# Patient Record
Sex: Male | Born: 1935 | Race: White | Hispanic: No | Marital: Single
Health system: Midwestern US, Community
[De-identification: ages and names within clinical notes are randomized; demographics above are authoritative.]

## PROBLEM LIST (undated history)

## (undated) DIAGNOSIS — G629 Polyneuropathy, unspecified: Secondary | ICD-10-CM

## (undated) DIAGNOSIS — K219 Gastro-esophageal reflux disease without esophagitis: Secondary | ICD-10-CM

## (undated) DIAGNOSIS — F329 Major depressive disorder, single episode, unspecified: Secondary | ICD-10-CM

## (undated) DIAGNOSIS — M869 Osteomyelitis, unspecified: Secondary | ICD-10-CM

## (undated) DIAGNOSIS — J449 Chronic obstructive pulmonary disease, unspecified: Secondary | ICD-10-CM

## (undated) DIAGNOSIS — I519 Heart disease, unspecified: Secondary | ICD-10-CM

## (undated) DIAGNOSIS — Z86718 Personal history of other venous thrombosis and embolism: Secondary | ICD-10-CM

## (undated) DIAGNOSIS — M1990 Unspecified osteoarthritis, unspecified site: Secondary | ICD-10-CM

## (undated) DIAGNOSIS — F32A Depression, unspecified: Secondary | ICD-10-CM

## (undated) DIAGNOSIS — Z8489 Family history of other specified conditions: Secondary | ICD-10-CM

## (undated) DIAGNOSIS — I2510 Atherosclerotic heart disease of native coronary artery without angina pectoris: Secondary | ICD-10-CM

## (undated) DIAGNOSIS — I509 Heart failure, unspecified: Secondary | ICD-10-CM

## (undated) DIAGNOSIS — L409 Psoriasis, unspecified: Secondary | ICD-10-CM

## (undated) DIAGNOSIS — D892 Hypergammaglobulinemia, unspecified: Secondary | ICD-10-CM

## (undated) DIAGNOSIS — Z550 Illiteracy and low-level literacy: Secondary | ICD-10-CM

## (undated) DIAGNOSIS — I739 Peripheral vascular disease, unspecified: Secondary | ICD-10-CM

## (undated) HISTORY — DX: Personal history of other venous thrombosis and embolism: Z86.718

## (undated) HISTORY — PX: APPENDECTOMY: SHX54

## (undated) HISTORY — DX: Depression, unspecified: F32.A

## (undated) HISTORY — PX: FOOT SURGERY: SHX648

## (undated) HISTORY — DX: Chronic obstructive pulmonary disease, unspecified: J44.9

## (undated) HISTORY — DX: Heart failure, unspecified: I50.9

## (undated) HISTORY — PX: CARDIAC SURGERY: SHX584

## (undated) HISTORY — DX: Major depressive disorder, single episode, unspecified: F32.9

## (undated) HISTORY — PX: PACEMAKER INSERTION: SHX728

## (undated) HISTORY — DX: Hypergammaglobulinemia, unspecified: D89.2

## (undated) HISTORY — DX: Polyneuropathy, unspecified: G62.9

## (undated) HISTORY — DX: Unspecified osteoarthritis, unspecified site: M19.90

## (undated) HISTORY — DX: Atherosclerotic heart disease of native coronary artery without angina pectoris: I25.10

## (undated) HISTORY — PX: OTHER SURGICAL HISTORY: SHX169

## (undated) HISTORY — DX: Osteomyelitis, unspecified: M86.9

## (undated) HISTORY — DX: Heart disease, unspecified: I51.9

## (undated) HISTORY — DX: Gastro-esophageal reflux disease without esophagitis: K21.9
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
## 2000-02-16 HISTORY — PX: CORONARY ARTERY BYPASS GRAFT: SHX141

## 2010-02-15 HISTORY — PX: CLAVICLE SURGERY: SHX598

## 2010-10-23 ENCOUNTER — Ambulatory Visit: Payer: Self-pay

## 2010-10-26 ENCOUNTER — Ambulatory Visit: Payer: Self-pay

## 2010-10-30 ENCOUNTER — Ambulatory Visit: Payer: Self-pay | Admitting: Internal Medicine

## 2011-01-18 ENCOUNTER — Emergency Department: Payer: Self-pay | Admitting: Internal Medicine

## 2011-01-26 ENCOUNTER — Ambulatory Visit: Payer: Self-pay | Admitting: Orthopedic Surgery

## 2011-01-28 ENCOUNTER — Ambulatory Visit: Payer: Self-pay | Admitting: Orthopedic Surgery

## 2011-03-01 ENCOUNTER — Other Ambulatory Visit: Payer: Self-pay | Admitting: Orthopedic Surgery

## 2011-03-01 DIAGNOSIS — R200 Anesthesia of skin: Secondary | ICD-10-CM

## 2011-03-01 DIAGNOSIS — M542 Cervicalgia: Secondary | ICD-10-CM

## 2011-03-01 DIAGNOSIS — R202 Paresthesia of skin: Secondary | ICD-10-CM

## 2011-03-04 ENCOUNTER — Ambulatory Visit
Admission: RE | Admit: 2011-03-04 | Discharge: 2011-03-04 | Disposition: A | Discharge status: Home / Self Care | Payer: Medicare PPO | Source: Ambulatory Visit | Attending: Orthopedic Surgery | Admitting: Orthopedic Surgery

## 2011-03-04 DIAGNOSIS — R200 Anesthesia of skin: Secondary | ICD-10-CM

## 2011-03-04 DIAGNOSIS — M542 Cervicalgia: Secondary | ICD-10-CM

## 2011-03-04 DIAGNOSIS — R202 Paresthesia of skin: Secondary | ICD-10-CM

## 2011-03-04 MED ORDER — IOHEXOL 300 MG/ML  SOLN
10.0000 mL | Freq: Once | INTRAMUSCULAR | Status: AC | PRN
  Administered 2011-03-04: 10 mL via INTRATHECAL

## 2011-03-04 MED ORDER — DIAZEPAM 5 MG PO TABS
5.0000 mg | ORAL_TABLET | Freq: Once | ORAL | Status: AC
  Administered 2011-03-04: 5 mg via ORAL

## 2011-03-04 NOTE — Progress Notes (Signed)
Explained discharge instructions to pt and daughter, consent signed and valium to be given.

## 2011-03-04 NOTE — Progress Notes (Signed)
Patient eating a snack with daughter at bedside.  Denies pain.  jkl

## 2011-07-23 LAB — METABOLIC PANEL, COMPREHENSIVE
A-G Ratio: 0.8 — ABNORMAL LOW (ref 1.1–2.2)
ALT (SGPT): 18 U/L (ref 12–78)
AST (SGOT): 20 U/L (ref 15–37)
Albumin: 3.9 g/dL (ref 3.5–5.0)
Alk. phosphatase: 145 U/L — ABNORMAL HIGH (ref 50–136)
Anion gap: 6 mmol/L (ref 5–15)
BUN/Creatinine ratio: 11 — ABNORMAL LOW (ref 12–20)
BUN: 10 MG/DL (ref 6–20)
Bilirubin, total: 0.6 MG/DL (ref 0.2–1.0)
CO2: 27 MMOL/L (ref 21–32)
Calcium: 9.2 MG/DL (ref 8.5–10.1)
Chloride: 108 MMOL/L (ref 97–108)
Creatinine: 0.92 MG/DL (ref 0.45–1.15)
GFR est AA: >60 mL/min/{1.73_m2} (ref >60)
GFR est non-AA: >60 mL/min/{1.73_m2} (ref >60)
Globulin: 4.6 g/dL — ABNORMAL HIGH (ref 2.0–4.0)
Glucose: 94 MG/DL (ref 65–100)
Potassium: 3.9 MMOL/L (ref 3.5–5.1)
Protein, total: 8.5 g/dL — ABNORMAL HIGH (ref 6.4–8.2)
Sodium: 141 MMOL/L (ref 136–145)

## 2011-07-23 LAB — EKG, 12 LEAD, INITIAL
Atrial Rate: 66 {beats}/min
Calculated R Axis: -94 degrees
Calculated T Axis: 83 degrees
P-R Interval: 112 ms
Q-T Interval: 454 ms
QRS Duration: 144 ms
QTC Calculation (Bezet): 475 ms
Ventricular Rate: 66 {beats}/min

## 2011-07-23 LAB — CBC WITH AUTOMATED DIFF
ABS. BASOPHILS: 0 10*3/uL (ref 0.0–0.1)
ABS. EOSINOPHILS: 0.3 10*3/uL (ref 0.0–0.4)
ABS. LYMPHOCYTES: 1.4 10*3/uL (ref 0.8–3.5)
ABS. MONOCYTES: 0.6 10*3/uL (ref 0.0–1.0)
ABS. NEUTROPHILS: 4.9 10*3/uL (ref 1.8–8.0)
BASOPHILS: 0 % (ref 0–1)
EOSINOPHILS: 4 % (ref 0–7)
HCT: 38.5 % (ref 36.6–50.3)
HGB: 13.7 g/dL (ref 12.1–17.0)
LYMPHOCYTES: 19 % (ref 12–49)
MCH: 32.7 PG (ref 26.0–34.0)
MCHC: 35.6 g/dL (ref 30.0–36.5)
MCV: 91.9 FL (ref 80.0–99.0)
MONOCYTES: 8 % (ref 5–13)
NEUTROPHILS: 69 % (ref 32–75)
PLATELET: 246 10*3/uL (ref 150–400)
RBC: 4.19 M/uL (ref 4.10–5.70)
RDW: 14.1 % (ref 11.5–14.5)
WBC: 7.1 10*3/uL (ref 4.1–11.1)

## 2011-07-23 LAB — TROPONIN I: Troponin-I, Qt.: <0.04 ng/mL (ref <0.05)

## 2011-07-23 LAB — CK W/ REFLX CKMB: CK: 95 U/L (ref 39–308)

## 2011-07-23 MED ORDER — SODIUM CHLORIDE 0.9 % IJ SYRG
Freq: Three times a day (TID) | INTRAMUSCULAR | Status: DC
Start: 2011-07-23 — End: 2011-07-23
  Administered 2011-07-23: 18:00:00 via INTRAVENOUS

## 2011-07-23 MED ORDER — CEPHALEXIN 500 MG CAP
500 mg | ORAL_CAPSULE | Freq: Four times a day (QID) | ORAL | Status: AC
Start: 2011-07-23 — End: 2011-07-30

## 2011-07-23 MED ORDER — TRIMETHOPRIM-SULFAMETHOXAZOLE 160 MG-800 MG TAB
160-800 mg | ORAL_TABLET | Freq: Two times a day (BID) | ORAL | Status: DC
Start: 2011-07-23 — End: 2011-08-18

## 2011-07-23 MED ORDER — SODIUM CHLORIDE 0.9 % IJ SYRG
INTRAMUSCULAR | Status: DC | PRN
Start: 2011-07-23 — End: 2011-07-23

## 2011-07-23 MED ORDER — OXYCODONE-ACETAMINOPHEN 5 MG-325 MG TAB
5-325 mg | ORAL | Status: AC
Start: 2011-07-23 — End: 2011-07-23
  Administered 2011-07-23: 16:00:00 via ORAL

## 2011-07-23 MED ORDER — HYDROCODONE-ACETAMINOPHEN 5 MG-500 MG TAB
5-500 mg | ORAL_TABLET | ORAL | Status: DC | PRN
Start: 2011-07-23 — End: 2011-08-18

## 2011-07-23 MED FILL — OXYCODONE-ACETAMINOPHEN 5 MG-325 MG TAB: 5-325 mg | ORAL | Qty: 2
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
## 2011-07-23 NOTE — ED Notes (Signed)
Called patient in to triage. Patient reports "its not just my foot." Patient also complaining of pain in chest Patient removed from FT room 2 and taken to room 19

## 2011-07-23 NOTE — ED Notes (Signed)
Discharge instructions given to patient by Dr. Stadler. PIV removed. VSS. Patient ambulatory to discharge.
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
## 2011-08-09 ENCOUNTER — Inpatient Hospital Stay
Admit: 2011-08-09 | Discharge: 2011-08-18 | Disposition: A | Discharge status: Skilled Nursing Facility | Payer: MEDICARE | Source: Ambulatory Visit | Attending: Orthopaedic Surgery | Admitting: Orthopaedic Surgery

## 2011-08-09 DIAGNOSIS — I429 Cardiomyopathy, unspecified: Secondary | ICD-10-CM | POA: Insufficient documentation

## 2011-08-09 DIAGNOSIS — E1169 Type 2 diabetes mellitus with other specified complication: Secondary | ICD-10-CM

## 2011-08-09 LAB — CBC W/O DIFF
HCT: 40.3 % (ref 36.6–50.3)
HGB: 14.2 g/dL (ref 12.1–17.0)
MCH: 32.2 PG (ref 26.0–34.0)
MCHC: 35.2 g/dL (ref 30.0–36.5)
MCV: 91.4 FL (ref 80.0–99.0)
PLATELET: 325 10*3/uL (ref 150–400)
RBC: 4.41 M/uL (ref 4.10–5.70)
RDW: 13.3 % (ref 11.5–14.5)
WBC: 12.4 10*3/uL — ABNORMAL HIGH (ref 4.1–11.1)

## 2011-08-09 LAB — METABOLIC PANEL, BASIC
Anion gap: 10 mmol/L (ref 5–15)
BUN/Creatinine ratio: 10 — ABNORMAL LOW (ref 12–20)
BUN: 12 MG/DL (ref 6–20)
CO2: 21 MMOL/L (ref 21–32)
Calcium: 9.6 MG/DL (ref 8.5–10.1)
Chloride: 108 MMOL/L (ref 97–108)
Creatinine: 1.22 MG/DL — ABNORMAL HIGH (ref 0.45–1.15)
GFR est AA: >60 mL/min/{1.73_m2} (ref >60)
GFR est non-AA: 58 mL/min/{1.73_m2} — ABNORMAL LOW (ref >60)
Glucose: 90 MG/DL (ref 65–100)
Potassium: 3.8 MMOL/L (ref 3.5–5.1)
Sodium: 139 MMOL/L (ref 136–145)

## 2011-08-09 LAB — EKG, 12 LEAD, INITIAL
Atrial Rate: 81 {beats}/min
Calculated P Axis: 65 degrees
Calculated R Axis: -117 degrees
Calculated T Axis: 69 degrees
P-R Interval: 144 ms
Q-T Interval: 400 ms
QRS Duration: 130 ms
QTC Calculation (Bezet): 464 ms
Ventricular Rate: 81 {beats}/min

## 2011-08-09 LAB — HEMOGLOBIN A1C WITH EAG
Est. average glucose: 120 mg/dL
Hemoglobin A1c: 5.8 % (ref 4.2–6.3)

## 2011-08-09 LAB — GLUCOSE, POC
Glucose (POC): 99 mg/dL (ref 75–110)
Glucose (POC): 114 mg/dL — ABNORMAL HIGH (ref 75–110)

## 2011-08-09 MED ADMIN — HYDROcodone-acetaminophen (NORCO) 5-325 mg per tablet 1 Tab: ORAL | @ 17:00:00 | NDC 68084036811

## 2011-08-09 MED ADMIN — piperacillin-tazobactam (ZOSYN) 3.375 g in 0.9% sodium chloride (MBP/ADV) 100 mL MBP: INTRAVENOUS | @ 19:00:00 | NDC 00338055318

## 2011-08-09 MED ADMIN — 0.9% sodium chloride infusion: INTRAVENOUS | @ 21:00:00 | NDC 87701099893

## 2011-08-09 MED FILL — HYDROCODONE-ACETAMINOPHEN 5 MG-325 MG TAB: 5-325 mg | ORAL | Qty: 1

## 2011-08-09 MED FILL — SODIUM CHLORIDE 0.9 % IV: INTRAVENOUS | Qty: 1000

## 2011-08-09 MED FILL — ZOSYN 3.375 GRAM INTRAVENOUS SOLUTION: 3.375 gram | INTRAVENOUS | Qty: 3.38

## 2011-08-09 NOTE — Progress Notes (Signed)
Direct admit from Dr. Lynnda Shields office.  Dx:  Diabetic foot (left) ulcer.  For possible surgery tomorrow.
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
## 2011-08-09 NOTE — Consults (Signed)
Brief Vascular Surgery  Consult Note    Impression: Plantar left foot wound with palpable Pop and distal ant tib pulses but no DP or PT pulses. Suspect his blood supply is adequate for wound healing but will check arterial doppler study      Plan: Arterial doppler          Full note dictated    Ulyses Amor, MD
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
## 2011-08-09 NOTE — Consults (Signed)
Consultation H and P is dictated. ID [id number]  Assessment   Left foot infected diabetic ulcer   DM-II  AKI  Hx of CAD s/p MI, s/p AICD   Neuropathy   Patient is a poor historian, detail information about his heart disease is difficult to obtain. If plan procedure is I and D, patient is moderate to high risk for perioperative cardiovascular complication but he can proceed with the procedure  Cardiologist is consulted.

## 2011-08-09 NOTE — Progress Notes (Signed)
Echocardiogram completed.

## 2011-08-09 NOTE — Progress Notes (Addendum)
PCP - Dr. Zena Amos, [facility] [phone]   Also has been seen by Dr. Bari Edward in [place] recently  [phone]    Release of information sent to Dr. Judithann Graves office and Dr. Daryel Gerald office since patient is poor historian.  He also has an internal defibrillator that he has no information on.    According to md office visit on 06/02/2011 patient's medication list contains metoprolol and furosemide.  However, he brought his medication bottles in and neither were included.  He is unsure if he is on it or not.

## 2011-08-10 LAB — METABOLIC PANEL, COMPREHENSIVE
A-G Ratio: 0.6 — ABNORMAL LOW (ref 1.1–2.2)
ALT (SGPT): 14 U/L (ref 12–78)
AST (SGOT): 18 U/L (ref 15–37)
Albumin: 2.8 g/dL — ABNORMAL LOW (ref 3.5–5.0)
Alk. phosphatase: 103 U/L (ref 50–136)
Anion gap: 9 mmol/L (ref 5–15)
BUN/Creatinine ratio: 9 — ABNORMAL LOW (ref 12–20)
BUN: 11 MG/DL (ref 6–20)
Bilirubin, total: 0.7 MG/DL (ref 0.2–1.0)
CO2: 22 MMOL/L (ref 21–32)
Calcium: 8.5 MG/DL (ref 8.5–10.1)
Chloride: 106 MMOL/L (ref 97–108)
Creatinine: 1.16 MG/DL — ABNORMAL HIGH (ref 0.45–1.15)
GFR est AA: >60 mL/min/{1.73_m2} (ref >60)
GFR est non-AA: >60 mL/min/{1.73_m2} (ref >60)
Globulin: 4.5 g/dL — ABNORMAL HIGH (ref 2.0–4.0)
Glucose: 108 MG/DL — ABNORMAL HIGH (ref 65–100)
Potassium: 3.7 MMOL/L (ref 3.5–5.1)
Protein, total: 7.3 g/dL (ref 6.4–8.2)
Sodium: 137 MMOL/L (ref 136–145)

## 2011-08-10 LAB — URINALYSIS W/ REFLEX CULTURE
Bacteria: NEGATIVE /HPF
Bilirubin: NEGATIVE
Blood: NEGATIVE
Glucose: NEGATIVE MG/DL
Ketone: NEGATIVE MG/DL
Leukocyte Esterase: NEGATIVE
Nitrites: NEGATIVE
Protein: NEGATIVE MG/DL
Specific gravity: 1.016 (ref 1.003–1.030)
Urobilinogen: 1 EU/DL (ref 0.2–1.0)
pH (UA): 5.5 (ref 5.0–8.0)

## 2011-08-10 LAB — BNP: BNP: 32 pg/mL (ref 0–100)

## 2011-08-10 LAB — LIPID PANEL
CHOL/HDL Ratio: 3.8 (ref 0–5.0)
Cholesterol, total: 118 MG/DL (ref <200)
HDL Cholesterol: 31 MG/DL
LDL, calculated: 73.4 MG/DL (ref 0–100)
Triglyceride: 68 MG/DL (ref <150)
VLDL, calculated: 13.6 MG/DL

## 2011-08-10 LAB — CBC W/O DIFF
HCT: 33.4 % — ABNORMAL LOW (ref 36.6–50.3)
HGB: 11.7 g/dL — ABNORMAL LOW (ref 12.1–17.0)
MCH: 31.5 PG (ref 26.0–34.0)
MCHC: 35 g/dL (ref 30.0–36.5)
MCV: 89.8 FL (ref 80.0–99.0)
PLATELET: 259 10*3/uL (ref 150–400)
RBC: 3.72 M/uL — ABNORMAL LOW (ref 4.10–5.70)
RDW: 12.9 % (ref 11.5–14.5)
WBC: 5.7 10*3/uL (ref 4.1–11.1)

## 2011-08-10 LAB — GLUCOSE, POC
Glucose (POC): 92 mg/dL (ref 75–110)
Glucose (POC): 110 mg/dL (ref 75–110)
Glucose (POC): 117 mg/dL — ABNORMAL HIGH (ref 75–110)
Glucose (POC): 110 mg/dL (ref 75–110)

## 2011-08-10 MED ADMIN — 0.9% sodium chloride infusion: INTRAVENOUS | @ 19:00:00 | NDC 00409798348

## 2011-08-10 MED ADMIN — aspirin chewable tablet 81 mg: ORAL | @ 16:00:00 | NDC 63739043401

## 2011-08-10 MED ADMIN — piperacillin-tazobactam (ZOSYN) 3.375 g in 0.9% sodium chloride (MBP/ADV) 100 mL MBP: INTRAVENOUS | @ 03:00:00 | NDC 00338055318

## 2011-08-10 MED ADMIN — HYDROcodone-acetaminophen (NORCO) 5-325 mg per tablet 1 Tab: ORAL | @ 03:00:00 | NDC 68084036811

## 2011-08-10 MED ADMIN — piperacillin-tazobactam (ZOSYN) 3.375 g in 0.9% sodium chloride (MBP/ADV) 100 mL MBP: INTRAVENOUS | @ 13:00:00 | NDC 00338055318

## 2011-08-10 MED ADMIN — metoprolol-XL (TOPROL-XL) tablet 25 mg: ORAL | @ 13:00:00 | NDC 82009011305

## 2011-08-10 MED ADMIN — HYDROcodone-acetaminophen (NORCO) 5-325 mg per tablet 1 Tab: ORAL | @ 11:00:00 | NDC 68084036811

## 2011-08-10 MED ADMIN — piperacillin-tazobactam (ZOSYN) 3.375 g in 0.9% sodium chloride (MBP/ADV) 100 mL MBP: INTRAVENOUS | @ 07:00:00 | NDC 00338055318

## 2011-08-10 MED ADMIN — carvedilol (COREG) tablet 6.25 mg: ORAL | @ 21:00:00 | NDC 68084026211

## 2011-08-10 MED ADMIN — lisinopril (PRINIVIL, ZESTRIL) tablet 5 mg: ORAL | @ 16:00:00 | NDC 64679092801

## 2011-08-10 MED ADMIN — sodium chloride 0.9 % bolus infusion 250 mL: INTRAVENOUS | @ 20:00:00 | NDC 00409798353

## 2011-08-10 MED ADMIN — piperacillin-tazobactam (ZOSYN) 3.375 g in 0.9% sodium chloride (MBP/ADV) 100 mL MBP: INTRAVENOUS | @ 19:00:00 | NDC 00338055318

## 2011-08-10 MED ADMIN — carvedilol (COREG) tablet 6.25 mg: ORAL | @ 16:00:00 | NDC 68084026211

## 2011-08-10 MED ADMIN — sodium chloride 0.9 % bolus infusion 500 mL: INTRAVENOUS | @ 19:00:00 | NDC 00409798309

## 2011-08-10 MED ADMIN — gabapentin (NEURONTIN) capsule 300 mg: ORAL | @ 21:00:00 | NDC 68084056311

## 2011-08-10 MED ADMIN — atorvastatin (LIPITOR) tablet 20 mg: ORAL | @ 03:00:00 | NDC 51079041001

## 2011-08-10 MED ADMIN — HYDROcodone-acetaminophen (NORCO) 5-325 mg per tablet 1 Tab: ORAL | @ 16:00:00 | NDC 68084036811

## 2011-08-10 MED FILL — SODIUM CHLORIDE 0.9 % IV: INTRAVENOUS | Qty: 250

## 2011-08-10 MED FILL — HYDROCODONE-ACETAMINOPHEN 5 MG-325 MG TAB: 5-325 mg | ORAL | Qty: 1

## 2011-08-10 MED FILL — ZOSYN 3.375 GRAM INTRAVENOUS SOLUTION: 3.375 gram | INTRAVENOUS | Qty: 3.38

## 2011-08-10 MED FILL — BAYER CHILDRENS ASPIRIN 81 MG CHEWABLE TABLET: 81 mg | ORAL | Qty: 1

## 2011-08-10 MED FILL — SODIUM CHLORIDE 0.9 % IV: INTRAVENOUS | Qty: 500

## 2011-08-10 MED FILL — LISINOPRIL 5 MG TAB: 5 mg | ORAL | Qty: 1

## 2011-08-10 MED FILL — LIPITOR 20 MG TABLET: 20 mg | ORAL | Qty: 1

## 2011-08-10 MED FILL — CARVEDILOL 6.25 MG TAB: 6.25 mg | ORAL | Qty: 1

## 2011-08-10 MED FILL — GABAPENTIN 300 MG CAP: 300 mg | ORAL | Qty: 1

## 2011-08-10 MED FILL — SODIUM CHLORIDE 0.9 % IV: INTRAVENOUS | Qty: 1000

## 2011-08-10 NOTE — Progress Notes (Addendum)
[facility] Progress Note         NAME:  Michael Lozano   DOB:   [birth year]   MRN:   [id number]     Assessment/Plan:   1. CAD--hx CABD, AICD,cardiomyopathy. restart asa, start coreg stop metoprolol, keep statin  2. Foot ulceration/infection--left foot, followed by Dr. Manson Passey  3. DM--currently on SSI, last A1C this admit 5.8%  4. Dyslipidemia--get lipid panel, continue statin  5. ABI--1.13 R, 1.11L    Cardiology attending: seen and examined. Agree with assess and plan. Few med adjustments as above.    He has a cardiomyopathy with systolic dysfunction but is compensated, therefore is compensated chronic systolic heart failure.       Subjective:   Denies chest pain, shortness of breath, nausea, vomiting, diarrhea, +tenderness left foot     Objective:       06/23 1900 - 06/25 0659  In: -   Out: 500 [Urine:500]    Telemetry: none    Physical Exam:    BP 115/70   Pulse 68   Temp 97.5 ??F (36.4 ??C)   Resp 20   Ht 6' (1.829 m)   Wt 188 lb 4.4 oz (85.4 kg)   BMI 25.53 kg/m2   SpO2 96%  General Appearance:  Well developed, well nourished,alert and oriented x 3 individual in no acute distress.   Ears/Nose/Mouth/Throat:   Hearing grossly normal.         Neck: Supple.   Chest:   Lungs clear to auscultation bilaterally.   Cardiovascular:  Regular rate and rhythm, S1, S2 normal, no murmur.   Abdomen:   Soft, non-tender, bowel sounds are active.   Extremities: Left foot mild edema and redness, dressing cdi   Skin: Warm and dry.               Additional comments: None    Care Plan discussed with:    Comments   Patient x    Family      RN     Care Manager                    Consultant:        Data Review:     No results found for this basename: ITNL in the last 72 hours   No results found for this basename: CPK:3,CKMB:3,TROIQ:3, in the last 72 hours  Recent Labs   [facility] 08/10/11 0616 08/09/11 1210    NA 137 139    K 3.7 3.8    CL 106 108    CO2 22 21    BUN 11 12    CREA 1.16* 1.22*     GLU 108* 90    PHOS -- --    MG -- --    ALB 2.8* --    WBC 5.7 12.4*    HGB 11.7* 14.2    HCT 33.4* 40.3    PLT 259 325     No results found for this basename: INR:3,PTP:3,APTT:3, in the last 72 hours    Medications reviewed  Current Facility-Administered Medications   Medication Dose Route Frequency   ??? HYDROcodone-acetaminophen (NORCO) 5-325 mg per tablet 1 Tab  1 Tab Oral Q4H PRN   ??? piperacillin-tazobactam (ZOSYN) 3.375 g in 0.9% sodium chloride (MBP/ADV) 100 mL MBP  3.375 g IntraVENous Q6H   ??? metoprolol-XL (TOPROL-XL) tablet 25 mg  25 mg Oral DAILY   ??? atorvastatin (LIPITOR) tablet 20 mg  20 mg Oral QHS   ???  insulin lispro (HUMALOG) injection   SubCUTAneous AC&HS   ??? glucose chewable tablet 16 g  4 Tab Oral PRN   ??? dextrose (D50W) injection Syrg 12.5-25 g  12.5-25 g IntraVENous PRN   ??? glucagon (GLUCAGEN) injection 1 mg  1 mg IntraMUSCular PRN   ??? 0.9% sodium chloride infusion  25 mL/hr IntraVENous CONTINUOUS   ??? DISCONTD: lisinopril (PRINIVIL, ZESTRIL) tablet 5 mg  5 mg Oral DAILY       Data Reviewed: current meds, labs,recent radiology, intake/output/weight and problem list reviewed    Gracy Racer, NP

## 2011-08-10 NOTE — Progress Notes (Signed)
Please see the more detailed note from the NP.  Patient was seen and examined.  I agree with NP's assessment and plan.

## 2011-08-10 NOTE — Procedures (Signed)
[facility]  *** FINAL REPORT ***    Name: File, Alma  MRN: [id number]  DOB: 31 Jul 1934  Visit: [id number]  Date: 09 Aug 2011    TYPE OF TEST: Peripheral Arterial Testing    REASON FOR TEST  Foot wound    Right Leg  Segmentals: Normal                     mmHg  Brachial         142  High thigh  Low thigh        161  Calf             160  Posterior tibial 159  Dorsalis pedis   160  Peroneal  Metatarsal  Toe pressure     130  Doppler:  PVR:        Normal  Ankle/Brachial: 1.13    Left Leg  Segmentals: Normal                     mmHg  Brachial         140  High thigh  Low thigh        153  Calf             152  Posterior tibial  Dorsalis pedis   152  Peroneal  Metatarsal       157  Toe pressure     120  Doppler:  PVR:        Normal  Ankle/Brachial: 1.07    INTERPRETATION/FINDINGS  PROCEDURE:  Evaluation of lower extremity arteries with multilevel  systolic blood pressure measurements and pulse volume recording (PVR)  plethysmography.  Includes calculation of the ankle/brachial pressure  indices (ABI's).    FINDINGS:  ABI is 1.13 on the right and 1.11 on the left.  PVR  waveforms are normal on the right and normal on the left.    IMPRESSION:  There no evidence of hemodynamically significant lower  extremity arterial obstruction.    ADDITIONAL COMMENTS    I have personally reviewed the data relevant to the interpretation of  this  study.    TECHNOLOGIST:  Julie A. Toone    PHYSICIAN:  Electronically signed by:  Magdaline Zollars, MD  08/10/2011 06:59 AM

## 2011-08-10 NOTE — Progress Notes (Signed)
Reviewed plan of care with spring pritchett lpn.

## 2011-08-10 NOTE — H&P (Signed)
Name:       Michael Lozano, Michael Lozano                Admitted:    08/09/2011    Account #:  [id number]                     DOB:         [birth year]  Physician:  Daymon Larsen, MD          Age:         77                               HISTORY AND PHYSICAL      PRIMARY CARE PHYSICIAN: Zena Amos, MD    HISTORY OF PRESENT ILLNESS: The patient is a 77 year old male referred to  me by his primary care physician, Dr. Zena Amos, for a left diabetic  foot infection. The patient has multiple medical problems and is a bit of a  poor historian. The patient himself tells me that over the past month or  so, he as had a draining ulcer on the lateral portion of his left foot and  has seen several physicians and has been to several emergency rooms and  always just given antibiotics and sent home, but he continues to have  drainage from his left foot. He was referred to me by Dr. Williams Che.    PAST MEDICAL HISTORY: Includes diabetes, hypertension, neuropathy, coronary  artery disease, questionable heart failure, neuropathy.    HOME MEDICATIONS INCLUDE:  1. Neurontin.  2. Keflex.  3. Ramipril.  4. Omeprazole.  5. Glimepiride.  6. Lipitor.  7. Bactrim.  8. Lortab.    ALLERGIES: NO KNOWN DRUG ALLERGIES.    SOCIAL HISTORY: He states he lives with a friend. He apparently recently  was also living in [place] and has moved to [place]. He is a bit of  a poor historian on this fact as well.  PHYSICAL EXAMINATION  VITAL SIGNS: Blood pressure 109/62, pulse 73, temperature afebrile,  respirations 18.  GENERAL: He is alert, cooperative, not in distress. Appears his stated age  of 77 years old. He is normocephalic and atraumatic. He has no increased  work of breathing. With regards to the left leg, I am unable to palpate any  pedal pulses. He has a draining wound on the plantar portion of his foot  underneath the 5th metatarsal head and it is somewhat foul-smelling. His  foot on the left side is a bit more swollen and slightly more  erythematous  in general than the contralateral right foot which appears quite normal.    ASSESSMENT: I am going to go ahead and admit the patient to the hospital.  He certainly will need a workup by vascular surgery service as I am unable  to feel a pulse. The hospitalist service will see him in terms of managing  his chronic medical problems. I will start him on IV Zosyn antibiotics for  his left foot and then will follow and he potentially will need either a  debridement or more extensive surgery once he is in the hospital. I have  discussed this with him. At this point, he thinks that he has seen several  people and no one has really taken care of the problem. He wishes to be  admitted to the hospital. I will do that. All of his questions have been  answered.                Daymon Larsen, MD    cc:                       Daymon Larsen, MD      MHB/wmx; D: 08/10/2011 08:05 A; T: 08/10/2011 09:02 A; DOC# [id number]; Job#  [id number]

## 2011-08-10 NOTE — Progress Notes (Signed)
Per MRI Dept test cancelled because pt has defibrillator.  Called and advised Dr. Theora Gianotti office.

## 2011-08-10 NOTE — Progress Notes (Signed)
Hospitalist Progress Note          Norman Clay, NP  Romilda Joy [phone]  Call physician on-call through the operator 7pm-7am    Daily Progress Note: 08/10/2011    Assessment/Plan:   1. Left infected diabetic foot: Attending: Dr. Manson Passey. ABIs are normal. Plans were for MRI, however, pt has pacemaker. Ok for CT scan - will plan on hydration with bolus prior to CT and then at low continuous rate. On Zosyn. No known hx MRSA. Obtain wound culture if drainage.  2. DM type 2: HgbA1c: 5.8, accuchecks reveal controlled blood glucose. Continue to hold amaryl and monitor.   3. AKI: Creatinine improving, GFR >60. Hydrate.   4. Hx CAD: Cardiology following. ASA, coreg and continue statin.  5. Hx chronic systolic heart failure: No signs of decompensation. Appreciate cardiology input. Echo 6/24 EF 40%. On ACEi (low dose), coreg and ASA.  6. Hx peripheral neuropathy: Currently complaining of bil feet burning. Resume gabapentin, discussed with pharmacy, can start back at home dose.    DVT Prophylaxis: As per Ortho.  Care Plan/Dispo: To be determined.  Discussed plan of care with pt and nurse.      Subjective:   Pt lying in bed sleeping but easily awakens to voice. Has mild c/o bil feet burning.    Review of Systems:   Denies HA. No chest pain or pressure. No respiratory or abdominal complaints.     Objective:   Physical Exam:   BP 119/74   Pulse 60   Temp 99 ??F (37.2 ??C)   Resp 16   Ht 6' (1.829 m)   Wt 188 lb 4.4 oz   BMI 25.53 kg/m2   SpO2 96%   O2 Device: Room air  Temp (24hrs), Avg:99.2 ??F (37.3 ??C), Min:97.5 ??F (36.4 ??C), Max:101.6 ??F (38.7 ??C)        06/23 1900 - 06/25 0659  In: -   Out: 500 [Urine:500]  General:  Alert, cooperative, no acute distress.   HEENT: NC. Atraumatic. Anicteric Sclera. Dry oral mucosa.   Lungs:   Clear to auscultation bilaterally. No crackles/wheezes.   Heart:  Regular rate. No murmur, click, rub or gallop.   Abdomen:   Soft, non-tender. Non distended. + bowel sounds.     Extremities: Palpable pulses. Dressing to left foot intact.   Skin: Skin otherwise intact/Dry    Neurologic: A/O x 3 with no focal deficits. Speech Clear.    Psych: Cooperative. Not anxious or agitated.     Data Review:   24 Hour Results:  Recent Results (from the past 24 hour(s))   GLUCOSE, POC    Collection Time    08/09/11  4:59 PM       Component Value Range    POC GLUCOSE 114 (*) 75 - 110 mg/dL    Performed by Norberto Sorenson     GLUCOSE, POC    Collection Time    08/09/11 11:16 PM       Component Value Range    POC GLUCOSE 92  75 - 110 mg/dL    Performed by Tana Felts     URINALYSIS W/ REFLEX CULTURE    Collection Time    08/09/11 11:25 PM       Component Value Range    Color YELLOW      Appearance CLEAR      Specific gravity 1.016  1.003 - 1.030      pH 5.5  5.0 - 8.0  Protein NEGATIVE   NEGATIVE MG/DL    Glucose NEGATIVE   NEGATIVE MG/DL    Ketone NEGATIVE   NEGATIVE MG/DL    Bilirubin NEGATIVE   NEGATIVE    Blood NEGATIVE   NEGATIVE    Urobilinogen 1.0  0.2 - 1.0 EU/DL    Nitrites NEGATIVE   NEGATIVE    Leukocyte Esterase NEGATIVE   NEGATIVE    UA:UC IF INDICATED CULTURE NOT INDICATED BY UA RESULT      WBC 0-4  0 - 4 /HPF    RBC 0-3  0 - 5 /HPF    Epithelial cells 0-5  0 - 5 /LPF    Bacteria NEGATIVE   NEGATIVE /HPF    Hyaline Cast 0-2  0 - 2   LIPID PANEL    Collection Time    08/10/11  4:30 AM       Component Value Range    LIPID PROFILE          Cholesterol, total 118  <200 MG/DL    Triglyceride 68  <161 MG/DL    HDL Cholesterol 31      LDL, calculated 73.4  0 - 096 MG/DL    VLDL, calculated 04.5      CHOL/HDL Ratio 3.8  0 - 5.0     CBC W/O DIFF    Collection Time    08/10/11  6:16 AM       Component Value Range    WBC 5.7  4.1 - 11.1 K/uL    RBC 3.72 (*) 4.10 - 5.70 M/uL    HGB 11.7 (*) 12.1 - 17.0 g/dL    HCT 40.9 (*) 81.1 - 50.3 %    MCV 89.8  80.0 - 99.0 FL    MCH 31.5  26.0 - 34.0 PG    MCHC 35.0  30.0 - 36.5 g/dL    RDW 91.4  78.2 - 95.6 %    PLATELET 259  150 - 400 K/uL   METABOLIC PANEL,  COMPREHENSIVE    Collection Time    08/10/11  6:16 AM       Component Value Range    Sodium 137  136 - 145 MMOL/L    Potassium 3.7  3.5 - 5.1 MMOL/L    Chloride 106  97 - 108 MMOL/L    CO2 22  21 - 32 MMOL/L    Anion gap 9  5 - 15 mmol/L    Glucose 108 (*) 65 - 100 MG/DL    BUN 11  6 - 20 MG/DL    Creatinine 2.13 (*) 0.45 - 1.15 MG/DL    BUN/Creatinine ratio 9 (*) 12 - 20      GFR est-AA >60  >60 ml/min/1.21m2    GFR est non-AA >60  >60 ml/min/1.19m2    Calcium 8.5  8.5 - 10.1 MG/DL    Bilirubin, total 0.7  0.2 - 1.0 MG/DL    ALT 14  12 - 78 U/L    AST 18  15 - 37 U/L    Alk. phosphatase 103  50 - 136 U/L    Protein, total 7.3  6.4 - 8.2 g/dL    Albumin 2.8 (*) 3.5 - 5.0 g/dL    Globulin 4.5 (*) 2.0 - 4.0 g/dL    A-G Ratio 0.6 (*) 1.1 - 2.2     GLUCOSE, POC    Collection Time    08/10/11  6:31 AM       Component Value Range  POC GLUCOSE 110  75 - 110 mg/dL    Performed by Lonzo Cloud     GLUCOSE, POC    Collection Time    08/10/11 11:43 AM       Component Value Range    POC GLUCOSE 117 (*) 75 - 110 mg/dL    Performed by Cottie Banda       Medications reviewed as per [facility]  Total time spent with patient: 30 minutes.    [place]. Erma Heritage, NP

## 2011-08-10 NOTE — Progress Notes (Signed)
Bedside shift change report given to S. Pritchett, LPN (oncoming nurse) by T. Brandon, RN (offgoing nurse).  Report given with SBAR.

## 2011-08-10 NOTE — Progress Notes (Signed)
Pt is very happy with all of his medical care and attention here  Has been cleared by vascular for adequate circulation  Wound care assessing foot  Appreciate hospitalist input for multiple med problems    Left foot, fairly small but chronic wound under 5th met  Still able to express somewhat cloudy fluid from deeper inside    D/w pt   rec getting MRI if deep abscess or bone involvement may need surgery  If looks good maybe continue wound care and IV abx    Will order MRI

## 2011-08-10 NOTE — Progress Notes (Signed)
Pressure Ulcer Documentation  (COMPLETE ONE LABEL PER PRESSURE ULCER)  For further information, please review corresponding Wound Care flowsheet.      Graig Hessling Ludlum has:    No pressure ulcer noted and pressure ulcer prevention initiated.          Tailey Top Cristal Generous, LPN

## 2011-08-10 NOTE — Progress Notes (Signed)
Vascular:    ABI is normal. OK to proceed with planned foot debridement. Wound healing should not be limited by arterial insufficiency.    Michael Lozano

## 2011-08-10 NOTE — Progress Notes (Signed)
Bedside and Verbal shift change report given to jennifer hepler rn (oncoming nurse) by [employer] wolowic rn (offgoing nurse).  Report given with SBAR, Kardex, Procedure Summary, Intake/Output and MAR.

## 2011-08-10 NOTE — Wound Image (Signed)
WOCN Note    New consult for foot wound.  Chart reviewed.  Per notes, Dr. Manson Passey planning possible debridement with Dr. Cathren Harsh following as well.    Assessment:   Patient is clear, communicative and mobile.   Patient report chronic wound but with scattered history/recollection.  1. Left lateral foot at 5th metatarsal, present on admission = 0.9x0.5x1cm  Base is 50% soft light yellow 50% soft black (?dirt staining that does not clean- other areas of dirt noted on foot).  Heavily calloused edges.  Small amount of pink exudate.  Dorsum of foot dyschromia with blanching erythema.  No odor noted.  Palpable pedal pulses.  Bilateral heels clear.    Recommendations:  (Please see care plan and/or orders)  Mesalt packing with tail left outside and rested on dry gauze cover.  Change daily or more often if overwhelmed with drainage.  GOAL: encourage drainage and debridement.    Rodman Pickle, RN, CWOCN  office [phone]  pager [phone]

## 2011-08-10 NOTE — Consults (Signed)
Name:       Michael Lozano, Michael Lozano          Admitted:          08/09/2011                                         DOB:               [birth year]  Account #:  [id number]               Age:               77  Consultant: Ulyses Amor, MD        Location                                CONSULTATION REPORT    DATE OF CONSULTATION           08/09/2011      REASON FOR CONSULTATION: Left plantar foot wound.    HISTORY: The patient is a 77 year old diabetic male who was admitted by Dr.  Sharlet Salina for a left plantar foot wound. We were asked to evaluate him  from a vascular standpoint prior to possible foot debridement. He states he  has a history of vascular problems, though the details of this are unclear.  He has had no previous foot procedures or vascular operations or  interventions that we are aware of. He does have a history of coronary  artery disease with previous stent placement.    PAST MEDICAL HISTORY: Coronary artery disease with previous MI and previous  stent placement as well as placement of AICD, diabetes, hypertension.    MEDICATIONS:  1. Neurontin.  2. Ramipril.  3. Omeprazole.  4. Glimepiride.  5. Lipitor.    ALLERGIES: NONE.    SOCIAL HISTORY: The patient lives with his friend here in town.    FAMILY HISTORY: Unremarkable.    REVIEW OF SYSTEMS: He has had no recent cardiac, respiratory, GI, GU,  neurologic, hematologic or psychiatric complaints.    PHYSICAL EXAMINATION  GENERAL: He is an elderly male in no distress. He is awake, alert and  responsive.  PERIPHERAL VASCULAR EXAM: He has palpable femoral and popliteal pulses  bilaterally. On the right side, he has readily palpable posterior tibial  and dorsalis pedis pulses. On the left, an anterior tibial pulse is  palpable at the level of the ankle but the dorsalis pedis and posterior  tibial are not palpable at the level of the foot. He has a punched out  ulcer on the plantar aspect of the left foot overlying the fourth and fifth  metatarsal heads.     IMPRESSION: The patient has a diabetic plantar ulcer for which debridement  is planned by Dr. Manson Passey. He had an arterial Doppler study that showed an  ankle brachial index of 1.0 bilaterally. This in combination with his pulse  exam represent adequate blood supply to the foot for wound healing. I would  recommend proceeding with debridement as planned. We will see him as  needed.                Ulyses Amor, MD    cc:  Ulyses Amor, MD      GLL/wmx; D: 08/10/2011 06:21 A; T: 08/10/2011 08:25 A; DOC# [id number]; Job#  [id number]

## 2011-08-10 NOTE — Procedures (Signed)
[facility]  *** FINAL REPORT ***    Name: JAVARIOUS, ELSAYED  MRN: [id number]  DOB: 12/24/1934  Visit: [id number]  Date: 09 Aug 2011    TYPE OF TEST: Peripheral Arterial Testing    REASON FOR TEST  Foot wound    Right Leg  Segmentals: Normal                     mmHg  Brachial         142  High thigh  Low thigh        161  Calf             160  Posterior tibial 159  Dorsalis pedis   160  Peroneal  Metatarsal  Toe pressure     130  Doppler:  PVR:        Normal  Ankle/Brachial: 1.13    Left Leg  Segmentals: Normal                     mmHg  Brachial         140  High thigh  Low thigh        153  Calf             152  Posterior tibial  Dorsalis pedis   152  Peroneal  Metatarsal       157  Toe pressure     120  Doppler:  PVR:        Normal  Ankle/Brachial: 1.07    INTERPRETATION/FINDINGS  PROCEDURE:  Evaluation of lower extremity arteries with multilevel  systolic blood pressure measurements and pulse volume recording (PVR)  plethysmography.  Includes calculation of the ankle/brachial pressure  indices (ABI's).    FINDINGS:  ABI is 1.13 on the right and 1.11 on the left.  PVR  waveforms are normal on the right and normal on the left.    IMPRESSION:  There no evidence of hemodynamically significant lower  extremity arterial obstruction.    ADDITIONAL COMMENTS    I have personally reviewed the data relevant to the interpretation of  this  study.    TECHNOLOGIST:  Zenaida Deed. [place]    PHYSICIAN:  Electronically signed by:  Gaetana Michaelis, MD  08/10/2011 06:59 AM

## 2011-08-11 LAB — GLUCOSE, POC
Glucose (POC): 104 mg/dL (ref 75–110)
Glucose (POC): 101 mg/dL (ref 75–110)
Glucose (POC): 100 mg/dL (ref 75–110)
Glucose (POC): 146 mg/dL — ABNORMAL HIGH (ref 75–110)

## 2011-08-11 LAB — CBC WITH AUTOMATED DIFF
ABS. BASOPHILS: 0 10*3/uL (ref 0.0–0.1)
ABS. EOSINOPHILS: 0.3 10*3/uL (ref 0.0–0.4)
ABS. LYMPHOCYTES: 1.4 10*3/uL (ref 0.8–3.5)
ABS. MONOCYTES: 0.7 10*3/uL (ref 0.0–1.0)
ABS. NEUTROPHILS: 3 10*3/uL (ref 1.8–8.0)
BASOPHILS: 0 % (ref 0–1)
EOSINOPHILS: 5 % (ref 0–7)
HCT: 33.8 % — ABNORMAL LOW (ref 36.6–50.3)
HGB: 11.7 g/dL — ABNORMAL LOW (ref 12.1–17.0)
LYMPHOCYTES: 27 % (ref 12–49)
MCH: 31.4 PG (ref 26.0–34.0)
MCHC: 34.6 g/dL (ref 30.0–36.5)
MCV: 90.6 FL (ref 80.0–99.0)
MONOCYTES: 12 % (ref 5–13)
NEUTROPHILS: 56 % (ref 32–75)
PLATELET: 262 10*3/uL (ref 150–400)
RBC: 3.73 M/uL — ABNORMAL LOW (ref 4.10–5.70)
RDW: 13.2 % (ref 11.5–14.5)
WBC: 5.4 10*3/uL (ref 4.1–11.1)

## 2011-08-11 LAB — METABOLIC PANEL, BASIC
Anion gap: 8 mmol/L (ref 5–15)
BUN/Creatinine ratio: 11 — ABNORMAL LOW (ref 12–20)
BUN: 13 MG/DL (ref 6–20)
CO2: 22 MMOL/L (ref 21–32)
Calcium: 9.1 MG/DL (ref 8.5–10.1)
Chloride: 109 MMOL/L — ABNORMAL HIGH (ref 97–108)
Creatinine: 1.14 MG/DL (ref 0.45–1.15)
GFR est AA: >60 mL/min/{1.73_m2} (ref >60)
GFR est non-AA: >60 mL/min/{1.73_m2} (ref >60)
Glucose: 90 MG/DL (ref 65–100)
Potassium: 3.7 MMOL/L (ref 3.5–5.1)
Sodium: 139 MMOL/L (ref 136–145)

## 2011-08-11 MED ORDER — SODIUM CHLORIDE 0.9 % IJ SYRG
Freq: Once | INTRAMUSCULAR | Status: AC
Start: 2011-08-11 — End: 2011-08-11
  Administered 2011-08-11: 13:00:00 via INTRAVENOUS

## 2011-08-11 MED ORDER — IOVERSOL 320 MG/ML IV SOLN
320 mg iodine/mL | Freq: Once | INTRAVENOUS | Status: AC
Start: 2011-08-11 — End: 2011-08-11
  Administered 2011-08-11: 19:00:00 via INTRAVENOUS

## 2011-08-11 MED ORDER — SODIUM CHLORIDE 0.9% BOLUS IV
0.9 % | Freq: Once | INTRAVENOUS | Status: DC
Start: 2011-08-11 — End: 2011-08-11

## 2011-08-11 MED ADMIN — sodium chloride 0.9 % bolus infusion 250 mL: INTRAVENOUS | @ 14:00:00 | NDC 00409798302

## 2011-08-11 MED ADMIN — piperacillin-tazobactam (ZOSYN) 3.375 g in 0.9% sodium chloride (MBP/ADV) 100 mL MBP: INTRAVENOUS | NDC 00338055318

## 2011-08-11 MED ADMIN — lisinopril (PRINIVIL, ZESTRIL) tablet 2.5 mg: ORAL | @ 21:00:00 | NDC 68084006011

## 2011-08-11 MED ADMIN — atorvastatin (LIPITOR) tablet 20 mg: ORAL | @ 03:00:00 | NDC 51079041001

## 2011-08-11 MED ADMIN — HYDROcodone-acetaminophen (NORCO) 5-325 mg per tablet 1 Tab: ORAL | NDC 68084036811

## 2011-08-11 MED ADMIN — gabapentin (NEURONTIN) capsule 300 mg: ORAL | @ 21:00:00 | NDC 68084056311

## 2011-08-11 MED ADMIN — HYDROcodone-acetaminophen (NORCO) 5-325 mg per tablet 1 Tab: ORAL | @ 21:00:00 | NDC 68084036811

## 2011-08-11 MED ADMIN — aspirin chewable tablet 81 mg: ORAL | @ 21:00:00 | NDC 63739043401

## 2011-08-11 MED ADMIN — HYDROcodone-acetaminophen (NORCO) 5-325 mg per tablet 1 Tab: ORAL | @ 14:00:00 | NDC 68084036811

## 2011-08-11 MED ADMIN — gabapentin (NEURONTIN) capsule 300 mg: ORAL | @ 03:00:00 | NDC 68084056311

## 2011-08-11 MED ADMIN — carvedilol (COREG) tablet 6.25 mg: ORAL | @ 21:00:00 | NDC 68084026211

## 2011-08-11 MED ADMIN — piperacillin-tazobactam (ZOSYN) 3.375 g in 0.9% sodium chloride (MBP/ADV) 100 mL MBP: INTRAVENOUS | @ 18:00:00 | NDC 00338055318

## 2011-08-11 MED ADMIN — piperacillin-tazobactam (ZOSYN) 3.375 g in 0.9% sodium chloride (MBP/ADV) 100 mL MBP: INTRAVENOUS | @ 13:00:00 | NDC 81284015110

## 2011-08-11 MED ADMIN — piperacillin-tazobactam (ZOSYN) 3.375 g in 0.9% sodium chloride (MBP/ADV) 100 mL MBP: INTRAVENOUS | @ 07:00:00 | NDC 00338055318

## 2011-08-11 MED ADMIN — polyethylene glycol (MIRALAX) packet 17 g: ORAL | @ 11:00:00 | NDC 51079030601

## 2011-08-11 MED ADMIN — insulin lispro (HUMALOG) injection: SUBCUTANEOUS | @ 22:00:00 | NDC 00002751017

## 2011-08-11 MED ADMIN — gabapentin (NEURONTIN) capsule 300 mg: ORAL | @ 14:00:00 | NDC 68084056311

## 2011-08-11 MED ADMIN — HYDROcodone-acetaminophen (NORCO) 5-325 mg per tablet 1 Tab: ORAL | @ 10:00:00 | NDC 68084036811

## 2011-08-11 MED ADMIN — 0.9% sodium chloride infusion: INTRAVENOUS | @ 19:00:00 | NDC 00409798437

## 2011-08-11 MED FILL — HYDROCODONE-ACETAMINOPHEN 5 MG-325 MG TAB: 5-325 mg | ORAL | Qty: 1

## 2011-08-11 MED FILL — GABAPENTIN 300 MG CAP: 300 mg | ORAL | Qty: 1

## 2011-08-11 MED FILL — ZOSYN 3.375 GRAM INTRAVENOUS SOLUTION: 3.375 gram | INTRAVENOUS | Qty: 3.38

## 2011-08-11 MED FILL — POLYETHYLENE GLYCOL 3350 17 GRAM (100 %) ORAL POWDER PACKET: 17 gram | ORAL | Qty: 1

## 2011-08-11 MED FILL — LIPITOR 20 MG TABLET: 20 mg | ORAL | Qty: 1

## 2011-08-11 MED FILL — BAYER CHILDRENS ASPIRIN 81 MG CHEWABLE TABLET: 81 mg | ORAL | Qty: 1

## 2011-08-11 MED FILL — BD POSIFLUSH NORMAL SALINE 0.9 % INJECTION SYRINGE: INTRAMUSCULAR | Qty: 10

## 2011-08-11 MED FILL — PNEUMOVAX-23 25 MCG/0.5 ML INJECTION SOLUTION: 25 mcg/0.5 mL | INTRAMUSCULAR | Qty: 0.5

## 2011-08-11 MED FILL — SODIUM CHLORIDE 0.9 % IV: INTRAVENOUS | Qty: 250

## 2011-08-11 MED FILL — OPTIRAY 320 MG IODINE/ML INTRAVENOUS SOLUTION: 320 mg iodine/mL | INTRAVENOUS | Qty: 100

## 2011-08-11 MED FILL — LISINOPRIL 5 MG TAB: 5 mg | ORAL | Qty: 1

## 2011-08-11 MED FILL — CARVEDILOL 6.25 MG TAB: 6.25 mg | ORAL | Qty: 1

## 2011-08-11 NOTE — Progress Notes (Signed)
Hospitalist Progress Note          Norman Clay, NP  Michael Lozano [id number]  Call physician on-call through the operator 7pm-7am    Daily Progress Note: 08/11/2011    Assessment/Plan:   1. Left infected diabetic foot: Attending: Dr. Manson Passey. ABIs are normal. Plans were for MRI, however, pt has pacemaker. CT not completed last night, Cr actually improved after hydration last pm - will give another 250 mL bolus prior to CT today and continue at 50 ml/hr.  On Zosyn. No known hx MRSA. Obtain wound culture if drainage.  2. DM type 2: HgbA1c: 5.8, accuchecks reveal well controlled blood glucose. Continue to hold amaryl and monitor.   3. AKI: Creatinine improving, GFR >60. Continue gentle hydration.  4. Hypotension: Blood pressure on low side. Will further reduce lisinopril dosing and monitor.  5. Hx CAD: Cardiology following. ASA, coreg and continue statin.  6. Hx chronic systolic heart failure: No signs of decompensation. Appreciate cardiology input. Echo 6/24 EF 40%. On ACEi (low dose), coreg and ASA.  7. Hx peripheral neuropathy: Gabapentin resumed yesterday.     DVT Prophylaxis: As per Ortho.  Care Plan/Dispo: To be determined.  Discussed plan of care with pt and nurse.      Subjective:   Pt sitting up in bed, very talkative and has been on the phone for most of the morning. Denies complaints.    Review of Systems:   Denies HA. No chest pain or pressure. No abdominal complaints or abnormal SOB. No n/v, tolerating po.    Objective:   Physical Exam:   BP 98/64   Pulse 60   Temp 98 ??F (36.7 ??C)   Resp 18   Ht 6' (1.829 m)   Wt 188 lb 4.4 oz   BMI 25.53 kg/m2   SpO2 97%   O2 Device: Room air  Temp (24hrs), Avg:98.5 ??F (36.9 ??C), Min:98 ??F (36.7 ??C), Max:99 ??F (37.2 ??C)        06/24 1900 - 06/26 0659  In: 760 [P.O.:360; I.V.:400]  Out: 500 [Urine:500]  General:  Alert, cooperative, no acute distress.   HEENT: NC. Atraumatic. Anicteric Sclera. Moist oral mucosa.   Lungs:   Clear to auscultation  bilaterally. No crackles.   Heart:  Regular rate. No murmur, click, rub or gallop.   Abdomen:   Soft, non-tender. Non distended. + bowel sounds.    Extremities/Skin: Palpable pulses, LLE with generalized edema from foot to mid-shin. Dressing to left foot with serosanguinous drainage that has drained through sock and onto bed sheet.   Neurologic: A/O x 3 with no focal deficits. Speech Clear.    Psych: Cooperative. Not anxious or agitated.     Data Review:   24 Hour Results:  Recent Results (from the past 24 hour(s))   GLUCOSE, POC    Collection Time    08/10/11 11:43 AM       Component Value Range    POC GLUCOSE 117 (*) 75 - 110 mg/dL    Performed by Cottie Banda     GLUCOSE, POC    Collection Time    08/10/11  4:41 PM       Component Value Range    POC GLUCOSE 110  75 - 110 mg/dL    Performed by Lorel Monaco     GLUCOSE, POC    Collection Time    08/10/11  9:15 PM       Component Value Range    POC GLUCOSE 104  75 - 110 mg/dL    Performed by KELLY PAULA     CBC WITH AUTOMATED DIFF    Collection Time    08/11/11  5:35 AM       Component Value Range    WBC 5.4  4.1 - 11.1 K/uL    RBC 3.73 (*) 4.10 - 5.70 M/uL    HGB 11.7 (*) 12.1 - 17.0 g/dL    HCT 16.1 (*) 09.6 - 50.3 %    MCV 90.6  80.0 - 99.0 FL    MCH 31.4  26.0 - 34.0 PG    MCHC 34.6  30.0 - 36.5 g/dL    RDW 04.5  40.9 - 81.1 %    PLATELET 262  150 - 400 K/uL    NEUTROPHILS 56  32 - 75 %    LYMPHOCYTES 27  12 - 49 %    MONOCYTES 12  5 - 13 %    EOSINOPHILS 5  0 - 7 %    BASOPHILS 0  0 - 1 %    ABS. NEUTROPHILS 3.0  1.8 - 8.0 K/UL    ABS. LYMPHOCYTES 1.4  0.8 - 3.5 K/UL    ABS. MONOCYTES 0.7  0.0 - 1.0 K/UL    ABS. EOSINOPHILS 0.3  0.0 - 0.4 K/UL    ABS. BASOPHILS 0.0  0.0 - 0.1 K/UL   METABOLIC PANEL, BASIC    Collection Time    08/11/11  5:35 AM       Component Value Range    Sodium 139  136 - 145 MMOL/L    Potassium 3.7  3.5 - 5.1 MMOL/L    Chloride 109 (*) 97 - 108 MMOL/L    CO2 22  21 - 32 MMOL/L    Anion gap 8  5 - 15 mmol/L    Glucose 90  65 - 100 MG/DL    BUN  13  6 - 20 MG/DL    Creatinine 9.14  7.82 - 1.15 MG/DL    BUN/Creatinine ratio 11 (*) 12 - 20      GFR est-AA >60  >60 ml/min/1.20m2    GFR est non-AA >60  >60 ml/min/1.20m2    Calcium 9.1  8.5 - 10.1 MG/DL   GLUCOSE, POC    Collection Time    08/11/11  6:34 AM       Component Value Range    POC GLUCOSE 101  75 - 110 mg/dL    Performed by Lonzo Cloud       Medications reviewed as per [facility]  Total time spent with patient: 30 minutes.    [place]. Erma Heritage, NP

## 2011-08-11 NOTE — Progress Notes (Addendum)
[facility] Progress Note         NAME:  Michael Lozano   DOB:   08-Sep-1934   MRN:   [id number]     Assessment/Plan:   1. CAD--hx CABD, AICD,compensated chronic systolic heart failure-- continue asa, coreg, statin  2. Foot ulceration/infection--left foot, followed by Dr. Manson Passey, for CT to r/o osteomyelitis  3. DM--currently on SSI, last A1C this admit 5.8%  4. Dyslipidemia--08/10/11 lipid panel  TG 68  TC 118  HDL 31  LDL 73, continue statin  5. ABI--1.13 R, 1.11L    Cardiology attending: seen and examined. Agree with assess and plan.. Tolerating current meds, cardiac status stable and no further cardiac testing at this time. Will see prn.       Subjective:   Denies chest pain, shortness of breath, nausea, vomiting, diarrhea, +tenderness left foot now bleeding some  C/o constipation  Objective:       06/24 1900 - 06/26 0659  In: 760 [P.O.:360; I.V.:400]  Out: 500 [Urine:500]    Telemetry: none    Physical Exam:    BP 108/66   Pulse 59   Temp 97.4 ??F (36.3 ??C)   Resp 16   Ht 6' (1.829 m)   Wt 188 lb 4.4 oz (85.4 kg)   BMI 25.53 kg/m2   SpO2 97%  General Appearance:  Well developed, well nourished,alert and oriented x 3 individual in no acute distress.   Ears/Nose/Mouth/Throat:   Hearing grossly normal.         Neck: Supple.   Chest:   Lungs clear to auscultation bilaterally.   Cardiovascular:  Regular rate and rhythm, S1, S2 normal, no murmur.   Abdomen:   Soft, non-tender, bowel sounds are active. Umbilical hernia   Extremities: Left foot mild edema and redness, mild ooze   Skin: Warm and dry.               Additional comments: None    Care Plan discussed with:    Comments   Patient x    Family      RN x spring   Care Manager                    Consultant:        Data Review:     No results found for this basename: ITNL in the last 72 hours   No results found for this basename: CPK:3,CKMB:3,TROIQ:3, in the last 72 hours  Recent Labs   Basename 08/11/11 0535 08/10/11  0616 08/09/11 1210    NA 139 137 139    K 3.7 3.7 3.8    CL 109* 106 108    CO2 22 22 21     BUN 13 11 12     CREA 1.14 1.16* 1.22*    GLU 90 108* 90    PHOS -- -- --    MG -- -- --    ALB -- 2.8* --    WBC 5.4 5.7 12.4*    HGB 11.7* 11.7* 14.2    HCT 33.8* 33.4* 40.3    PLT 262 259 325     No results found for this basename: INR:3,PTP:3,APTT:3, in the last 72 hours    Medications reviewed  Current Facility-Administered Medications   Medication Dose Route Frequency   ??? polyethylene glycol (MIRALAX) packet 17 g  17 g Oral ONCE   ??? sodium chloride 0.9 % bolus infusion 250 mL  250 mL IntraVENous ONCE   ???  ioversol (OPTIRAY) 320 mg iodine/mL contrast injection 100 mL  100 mL IntraVENous RAD ONCE   ??? sodium chloride (NS) flush 10 mL  10 mL IntraVENous RAD ONCE   ??? lisinopril (PRINIVIL, ZESTRIL) tablet 2.5 mg  2.5 mg Oral DAILY   ??? DISCONTD: sodium chloride 0.9 % bolus infusion 100 mL  100 mL IntraVENous RAD ONCE   ??? aspirin chewable tablet 81 mg  81 mg Oral DAILY   ??? carvedilol (COREG) tablet 6.25 mg  6.25 mg Oral BID WITH MEALS   ??? 0.9% sodium chloride infusion  50 mL/hr IntraVENous CONTINUOUS   ??? sodium chloride 0.9 % bolus infusion 250 mL  250 mL IntraVENous ONCE   ??? gabapentin (NEURONTIN) capsule 300 mg  300 mg Oral TID   ??? DISCONTD: lisinopril (PRINIVIL, ZESTRIL) tablet 5 mg  5 mg Oral DAILY   ??? DISCONTD: sodium chloride 0.9 % bolus infusion 500 mL  500 mL IntraVENous ONCE   ??? HYDROcodone-acetaminophen (NORCO) 5-325 mg per tablet 1 Tab  1 Tab Oral Q4H PRN   ??? piperacillin-tazobactam (ZOSYN) 3.375 g in 0.9% sodium chloride (MBP/ADV) 100 mL MBP  3.375 g IntraVENous Q6H   ??? atorvastatin (LIPITOR) tablet 20 mg  20 mg Oral QHS   ??? insulin lispro (HUMALOG) injection   SubCUTAneous AC&HS   ??? glucose chewable tablet 16 g  4 Tab Oral PRN   ??? dextrose (D50W) injection Syrg 12.5-25 g  12.5-25 g IntraVENous PRN   ??? glucagon (GLUCAGEN) injection 1 mg  1 mg IntraMUSCular PRN       Data Reviewed: current meds, labs,recent radiology,  intake/output/weight and problem list reviewed    Gracy Racer, NP

## 2011-08-11 NOTE — Progress Notes (Signed)
Please see the more detailed note from the NP.  Patient was seen and examined.  I agree with NP's assessment and plan.

## 2011-08-11 NOTE — Progress Notes (Signed)
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

## 2011-08-11 NOTE — Progress Notes (Signed)
Spiritual Care Assessment/Progress Notes    Michael Lozano [id number]  [id number]    01-25-1935  77 y.o.  male    Patient Telephone Number: [phone] (home)   Religious Affiliation: Muslim   Language: English   Extended Emergency Contact Information  Primary Emergency Contact: [name],[name] L   [place]  Mobile Phone: [phone]  Relation: Friend  Secondary Emergency Contact: [employer] [facility] [place]  Home Phone: [phone]  Relation: Other Relative   Patient Active Problem List   Diagnoses Date Noted   ??? Cardiomyopathy 08/09/2011   ??? CAD (coronary artery disease) 08/09/2011        Date: 08/11/2011       Level of Religious/Spiritual Activity:  []          Involved in faith tradition/spiritual practice    []          Not involved in faith tradition/spiritual practice  [x]          Spiritually oriented    []          Claims no spiritual orientation    []          seeking spiritual identity  []          Feels alienated from religious practice/tradition  []          Feels angry about religious practice/tradition  [x]          Spirituality/religious tradition is a Theatre stage manager for coping at this time.  []          Not able to assess due to medical condition    Services Provided Today:  []          crisis intervention    []          reading Scriptures  [x]          spiritual assessment    [x]          prayer  [x]          empathic listening/emotional support  []          rites and rituals (cite in comments)  [x]          life review     []          religious support  []          theological development   []          advocacy  []          ethical dialog     []          blessing  []          bereavement support    []          support to family  []          anticipatory grief support   []          help with AMD  []          spiritual guidance    []          meditation      Spiritual Care Needs  [x]          Emotional Support  [x]          Spiritual/Religious Care  []          Loss/Adjustment  []           Advocacy/Referral /Ethics  []          No needs expressed at this time  []          Other: (note in comments)  Spiritual  Care Plan  []          Follow up visits with pt/family  []          Provide materials  []          Schedule sacraments  []          Contact [facility] [employer]  [x]          Follow up as needed  []          Other: (note in comments)     Comments: Provided pastoral support to patient. Patient was in bed resting comfortably and talked life review concerning friend also in hospital, family dynamics regarding lack of all family support, personal issue regarding having no home, but in all this deeply expressed his faith spiritual journey as strong and a source of focus. Provided prayer and advised of chaplain availability as needed. Chaplain Intern Truman Hayward

## 2011-08-11 NOTE — Progress Notes (Signed)
Bedside shift change report given to Aminata Fornah RN (oncoming nurse) by Jennifer Hepler RN (offgoing nurse).  Report given with SBAR, Kardex, Intake/Output and MAR.

## 2011-08-11 NOTE — Progress Notes (Signed)
NUTRITION       BPA nutrition referral received for non-healing wound.  PMH: DM2, HTN, neuropathy, CAD s/p MI, pacemaker.  Diet is on a diabetic low Na diet and is tolerating diet well.  Consuming 75-100% of meal since admission, denies nausea/vomiting or difficulty chewing/swallowing.  BG levels well controlled.   Plan:  Continue with current diet as tolerated.  Consider a daily MVI.  Suggest tight BG control for wound healing.  RD to follow.        Georgiana Shore, RD

## 2011-08-11 NOTE — Progress Notes (Signed)
Bedside and Verbal shift change report given to Spring (oncoming nurse) by Aminata Fornah, RN (offgoing nurse).  Report given with SBAR, Kardex and MAR.

## 2011-08-11 NOTE — Progress Notes (Signed)
Spoke with Dr. Manson Passey, if OK with hospitalist want CT scan ordered of Left foot with contrast to r/o osteomyelitis and fluid collection. Hospitalist says OK. Ordered placed

## 2011-08-12 LAB — METABOLIC PANEL, BASIC
Anion gap: 11 mmol/L (ref 5–15)
BUN/Creatinine ratio: 11 — ABNORMAL LOW (ref 12–20)
BUN: 11 MG/DL (ref 6–20)
CO2: 22 MMOL/L (ref 21–32)
Calcium: 8.9 MG/DL (ref 8.5–10.1)
Chloride: 108 MMOL/L (ref 97–108)
Creatinine: 1.02 MG/DL (ref 0.45–1.15)
GFR est AA: >60 mL/min/{1.73_m2} (ref >60)
GFR est non-AA: >60 mL/min/{1.73_m2} (ref >60)
Glucose: 103 MG/DL — ABNORMAL HIGH (ref 65–100)
Potassium: 3.9 MMOL/L (ref 3.5–5.1)
Sodium: 141 MMOL/L (ref 136–145)

## 2011-08-12 LAB — GLUCOSE, POC
Glucose (POC): 141 mg/dL — ABNORMAL HIGH (ref 75–110)
Glucose (POC): 100 mg/dL (ref 75–110)
Glucose (POC): 91 mg/dL (ref 75–110)
Glucose (POC): 91 mg/dL (ref 75–110)
Glucose (POC): 93 mg/dL (ref 75–110)

## 2011-08-12 LAB — CBC WITH AUTOMATED DIFF
ABS. BASOPHILS: 0 10*3/uL (ref 0.0–0.1)
ABS. EOSINOPHILS: 0.3 10*3/uL (ref 0.0–0.4)
ABS. LYMPHOCYTES: 1.7 10*3/uL (ref 0.8–3.5)
ABS. MONOCYTES: 0.4 10*3/uL (ref 0.0–1.0)
ABS. NEUTROPHILS: 3.5 10*3/uL (ref 1.8–8.0)
BASOPHILS: 0 % (ref 0–1)
EOSINOPHILS: 5 % (ref 0–7)
HCT: 34.5 % — ABNORMAL LOW (ref 36.6–50.3)
HGB: 12.2 g/dL (ref 12.1–17.0)
LYMPHOCYTES: 29 % (ref 12–49)
MCH: 31.6 PG (ref 26.0–34.0)
MCHC: 35.4 g/dL (ref 30.0–36.5)
MCV: 89.4 FL (ref 80.0–99.0)
MONOCYTES: 7 % (ref 5–13)
NEUTROPHILS: 59 % (ref 32–75)
PLATELET: 301 10*3/uL (ref 150–400)
RBC: 3.86 M/uL — ABNORMAL LOW (ref 4.10–5.70)
RDW: 13.2 % (ref 11.5–14.5)
WBC: 5.9 10*3/uL (ref 4.1–11.1)

## 2011-08-12 MED ADMIN — insulin lispro (HUMALOG) injection: SUBCUTANEOUS | @ 02:00:00 | NDC 00002751017

## 2011-08-12 MED ADMIN — fentaNYL citrate (PF) injection 50 mcg: INTRAVENOUS | @ 23:00:00 | NDC 00641602701

## 2011-08-12 MED ADMIN — lisinopril (PRINIVIL, ZESTRIL) tablet 2.5 mg: ORAL | @ 13:00:00 | NDC 64679092801

## 2011-08-12 MED ADMIN — gabapentin (NEURONTIN) capsule 300 mg: ORAL | @ 12:00:00 | NDC 68084056311

## 2011-08-12 MED ADMIN — midazolam (VERSED) injection 1 mg: INTRAVENOUS | @ 23:00:00 | NDC 63323041112

## 2011-08-12 MED ADMIN — piperacillin-tazobactam (ZOSYN) 3.375 g in 0.9% sodium chloride (MBP/ADV) 100 mL MBP: INTRAVENOUS | @ 07:00:00 | NDC 00338055318

## 2011-08-12 MED ADMIN — piperacillin-tazobactam (ZOSYN) 3.375 g in 0.9% sodium chloride (MBP/ADV) 100 mL MBP: INTRAVENOUS | @ 01:00:00 | NDC 00338055318

## 2011-08-12 MED ADMIN — carvedilol (COREG) tablet 6.25 mg: ORAL | @ 12:00:00 | NDC 68084026211

## 2011-08-12 MED ADMIN — 0.9% sodium chloride infusion: INTRAVENOUS | @ 05:00:00 | NDC 00409798309

## 2011-08-12 MED ADMIN — atorvastatin (LIPITOR) tablet 20 mg: ORAL | @ 02:00:00 | NDC 51079041001

## 2011-08-12 MED ADMIN — bacitracin 50,000 Units in sodium chloride irrigation 0.9 % 1,000 mL Irrigation: NDC 00409713809

## 2011-08-12 MED ADMIN — gabapentin (NEURONTIN) capsule 300 mg: ORAL | @ 02:00:00 | NDC 68084056311

## 2011-08-12 MED ADMIN — piperacillin-tazobactam (ZOSYN) 3.375 g in 0.9% sodium chloride (MBP/ADV) 100 mL MBP: INTRAVENOUS | @ 12:00:00 | NDC 00338055318

## 2011-08-12 MED ADMIN — vancomycin (VANCOCIN) 2000 mg in NS 500 ml infusion: INTRAVENOUS | @ 20:00:00 | NDC 09999945502

## 2011-08-12 MED ADMIN — HYDROcodone-acetaminophen (NORCO) 5-325 mg per tablet 1 Tab: ORAL | @ 02:00:00 | NDC 68084036811

## 2011-08-12 MED ADMIN — 0.9% sodium chloride infusion: INTRAVENOUS | @ 07:00:00 | NDC 00409798309

## 2011-08-12 MED ADMIN — piperacillin-tazobactam (ZOSYN) 3.375 g in 0.9% sodium chloride (MBP/ADV) 100 mL MBP: INTRAVENOUS | @ 18:00:00 | NDC 00338055318

## 2011-08-12 MED FILL — GABAPENTIN 300 MG CAP: 300 mg | ORAL | Qty: 1

## 2011-08-12 MED FILL — ZOSYN 3.375 GRAM INTRAVENOUS SOLUTION: 3.375 gram | INTRAVENOUS | Qty: 3.38

## 2011-08-12 MED FILL — HYDROCODONE-ACETAMINOPHEN 5 MG-325 MG TAB: 5-325 mg | ORAL | Qty: 1

## 2011-08-12 MED FILL — CARVEDILOL 6.25 MG TAB: 6.25 mg | ORAL | Qty: 1

## 2011-08-12 MED FILL — ONDANSETRON (PF) 4 MG/2 ML INJECTION: 4 mg/2 mL | INTRAMUSCULAR | Qty: 2

## 2011-08-12 MED FILL — XYLOCAINE-MPF 10 MG/ML (1 %) INJECTION SOLUTION: 10 mg/mL (1 %) | INTRAMUSCULAR | Qty: 5

## 2011-08-12 MED FILL — LACTATED RINGERS IV: INTRAVENOUS | Qty: 1000

## 2011-08-12 MED FILL — VANCOMYCIN IN 0.9 % SODIUM CHLORIDE 2 GRAM/500 ML IV: 2 gram/500 mL | INTRAVENOUS | Qty: 500

## 2011-08-12 MED FILL — PHARMACY INFORMATION NOTE: Qty: 1

## 2011-08-12 MED FILL — MIDAZOLAM 1 MG/ML IJ SOLN: 1 mg/mL | INTRAMUSCULAR | Qty: 2

## 2011-08-12 MED FILL — MORPHINE 4 MG/ML SYRINGE: 4 mg/mL | INTRAMUSCULAR | Qty: 1

## 2011-08-12 MED FILL — LIPITOR 20 MG TABLET: 20 mg | ORAL | Qty: 1

## 2011-08-12 MED FILL — FENTANYL CITRATE (PF) 50 MCG/ML IJ SOLN: 50 mcg/mL | INTRAMUSCULAR | Qty: 2

## 2011-08-12 MED FILL — FENTANYL CITRATE (PF) 50 MCG/ML IJ SOLN: 50 mcg/mL | INTRAMUSCULAR | Qty: 5

## 2011-08-12 MED FILL — SODIUM CHLORIDE 0.9 % IV: INTRAVENOUS | Qty: 50

## 2011-08-12 MED FILL — SODIUM CHLORIDE 0.9 % IV: INTRAVENOUS | Qty: 1000

## 2011-08-12 MED FILL — VANCOMYCIN IN 0.9 % SODIUM CHLORIDE 1.25 GRAM/250 ML IV: 1.25 gram/250 mL | INTRAVENOUS | Qty: 250

## 2011-08-12 MED FILL — MIDAZOLAM 1 MG/ML IJ SOLN: 1 mg/mL | INTRAMUSCULAR | Qty: 5

## 2011-08-12 NOTE — Progress Notes (Signed)
Post-Anesthesia Evaluation and Assessment    Patient: Michael Lozano MRN: [id number]  SSN: [id number]    Date of Birth: [birth year]  Age: 77 y.o.  Sex: male       Cardiovascular Function/Vital Signs  Visit Vitals   Item Reading   ??? BP 161/80   ??? Pulse 61   ??? Temp 97.9 ??F (36.6 ??C)   ??? Resp 15   ??? Ht 6' (1.829 m)   ??? Wt 85.4 kg (188 lb 4.4 oz)   ??? BMI 25.53 kg/m2   ??? SpO2 93%       Patient is status post General anesthesia for Procedure(s) with comments:  INCISION AND DRAINAGE LOWER EXTREMITY - I&D LEFT FOOT WITH 5TH METATARSAL HEAD RESECTION LEFT.    Nausea/Vomiting: None    Postoperative hydration reviewed and adequate.    Pain:  Pain Scale 1: Numeric (0 - 10) (08/12/11 1909)  Pain Intensity 1: 4 (08/12/11 1909)   Managed    Neurological Status:   Neuro  Neurologic State: Appropriate for age (08/12/11 7829)  Orientation Level: Oriented X4 (08/12/11 5621)  Cognition: Appropriate decision making (08/12/11 0819)  Speech: Clear (08/12/11 0819)  LLE Motor Response: Tingling;Other(comment) (burning pain) (08/11/11 0826)   At baseline    Mental Status and Level of Consciousness: Sedated    Pulmonary Status:   O2 Device: Room air (08/12/11 1909)   Adequate oxygenation and airway patent    Complications related to anesthesia: None    Post-anesthesia assessment completed. No concerns    Signed By: Glenard Haring. Arturo Freundlich, DO     August 12, 2011

## 2011-08-12 NOTE — Other (Signed)
TRANSFER - OUT REPORT:  Verbal report given to MELODY( on MOISHE SCHELLENBERG  being transferred to 553(ni) for routine progression of care       Report consisted of patient???s Situation, Background, Assessment and   Recommendations(SBAR).     Information from the following report(s) SBAR was reviewed with the receiving nurse.    Opportunity for questions and clarification was provided.

## 2011-08-12 NOTE — Other (Signed)
Pt returned to floor, case delayed by surgeon, unknown when he will be available at this time.  Report update given to floor RN via phone.

## 2011-08-12 NOTE — Other (Signed)
Pt returned to preop in hospital bed.  Awaiting Surgeon.

## 2011-08-12 NOTE — Progress Notes (Signed)
Bedside shift change report given to Spring, LPN (Cabin crew) by Carmie Kanner, RN (offgoing nurse).  Report given with SBAR.

## 2011-08-12 NOTE — Progress Notes (Signed)
TRANSFER - OUT REPORT:    Verbal report given to B.Tisdell RN on [employer]  being transferred to Pre- op holding(unit) for ordered procedure       Report consisted of patient???s Situation, Background, Assessment and   Recommendations(SBAR).     Information from the following report(s) SBAR was reviewed with the receiving nurse.    Opportunity for questions and clarification was provided.

## 2011-08-12 NOTE — Progress Notes (Signed)
Pt returned to or via bed and iv fluids infusing
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
## 2011-08-12 NOTE — Progress Notes (Signed)
Vancomycin Dosing per Pharmacy    Consult provided for this 77 y.o., male for indication of osteomyelitis.  Patient also on the following antibiotic regimens:  Zosyn      Creatinine   Date/Time Value Range Status   08/12/2011  2:57 AM 1.02  0.45 - 1.15 MG/DL Final   03/17/8655  8:46 AM 1.14  0.45 - 1.15 MG/DL Final        BUN   Date/Time Value Range Status   08/12/2011  2:57 AM 11  6 - 20 MG/DL Final   9/62/9528  4:13 AM 13  6 - 20 MG/DL Final        WBC   Date/Time Value Range Status   08/12/2011  2:57 AM 5.9  4.1 - 11.1 K/uL Final   08/11/2011  5:35 AM 5.4  4.1 - 11.1 K/uL Final     Estimated CrCl = 68 ml/min.    Cultures: 6/26 L-foot wound streptococci, beta hemolytic group B (pending)    Goal Vancomycin trough = 15-20 mcg/dL    Vancomycin loading dose of 2000mg  IV x1 now followed by a maintenance dose of 1250mg  every 12 hours is expected to yield a therapeutic trough of 19 mcg/dL.  Pharmacy to follow patient daily and order levels / dose adjustments as appropriate.    Thank you,  Pharmacist Charyl Bigger, [facility]

## 2011-08-12 NOTE — Progress Notes (Signed)
Please see the more detailed note from the NP.  Patient was seen and examined.  I agree with NP's assessment and plan.

## 2011-08-12 NOTE — Brief Op Note (Signed)
BRIEF OPERATIVE NOTE    Date of Procedure: 08/12/2011   Preoperative Diagnosis: DIABETIC FOOT INFECTION WITH OSTEOMYELITIS  Postoperative Diagnosis: DIABETIC FOOT INFECTION DIABETIC FOOT INFECTION WITH OSTEOMYELITIS    Procedure: Procedure(s):  I&D LEFT FOOT WITH 5TH METATARSAL HEAD RESECTION LEFT  Surgeon(s) and Role:     * Cathlean Cower, MD - Primary  Anesthesia: General   Estimated Blood Loss: min  Specimens:   ID Type Source Tests Collected by Time Destination   1 : 5TH   METATARSAL  HEAD LEFT FOOT Fresh Foot, left  Cathlean Cower, MD 08/12/2011 1954 Pathology   2 : [facility] HEAD LEFT FOOT Fresh Foot, left  Cathlean Cower, MD 08/12/2011 1956 Pathology   LEFT FOOT : LEFT     FOOT  WOUND Wound Foot, left ANAEROBIC/AEROBIC/GRAM STAIN Cathlean Cower, MD 08/12/2011 1957 Microbiology      Findings: 5th mt head osteo   Complications: none  Implants: * No implants in log *

## 2011-08-12 NOTE — Progress Notes (Signed)
Bedside and Verbal shift change report given to melanie thompson lpn and susie harris rn (oncoming nurse) by jacquelyn wolowic rn (offgoing nurse).  Report given with SBAR, Kardex, OR Summary, Intake/Output and MAR.

## 2011-08-12 NOTE — Progress Notes (Signed)
Hospitalist Progress Note          Norman Clay, NP  Romilda Joy [phone]  Call physician on-call through the operator 7pm-7am    Daily Progress Note: 08/12/2011  Assessment/Plan:   1. L infected diabetic foot: Attending: Dr. Manson Passey. ABIs are normal. CT scan indicating osteomyelitis. On Zosyn, adding vancomycin for coverage and consult ID. No known hx MRSA. Wound culture pending, possible gram + cocci. For surgery today.  2. DM type 2: HgbA1c: 5.8, accuchecks reveal well controlled blood glucose. Continue to hold amaryl and monitor.   3. AKI: Creatinine continues to improve with gentle hydration.  4. Hypotension: Blood pressure erratic, continue with the same and monitor.   5. Hx CAD: Cardiology following. ASA, coreg and continue statin.  6. Hx chronic systolic heart failure: No signs of decompensation. Appreciate cardiology input. Echo 6/24 EF 40%. On ACEi (low dose), coreg and ASA.  7. Hx peripheral neuropathy: Gabapentin.     DVT Prophylaxis: As per Ortho.  Care Plan/Dispo: To be determined, pt may need placement. He reports he is homeless and has no where to go. Case Management consult.  Discussed plan of care with pt and nurse.      Subjective:   Pt sitting up on side of bed, has multiple questions about care.    Review of Systems:   Denies HA. No chest pain or pressure. No respiratory or abdominal complaints.    Objective:   Physical Exam:   BP 144/81   Pulse 66   Temp 98.2 ??F (36.8 ??C)   Resp 18   Ht 6' (1.829 m)   Wt 188 lb 4.4 oz   BMI 25.53 kg/m2   SpO2 98%   O2 Device: Room air  Temp (24hrs), Avg:97.8 ??F (36.6 ??C), Min:97.4 ??F (36.3 ??C), Max:98.2 ??F (36.8 ??C)        06/25 1900 - 06/27 0659  In: 400 [I.V.:400]  Out: -   General:  Alert, cooperative, no acute distress.   HEENT: NC. Moist oral mucosa.   Lungs:   Clear to auscultation bilaterally.    Heart:  Regular rate.    Abdomen:   Soft, non-tender. Non distended. + bowel sounds.    Extremities/Skin: Palpable pulses, LLE with  generalized edema from foot to mid-shin. Dressing to left foot clean dry and intact.   Neurologic: A/O x 3 with no focal deficits. Speech Clear.    Psych: Cooperative. Not anxious or agitated.     Data Review:   24 Hour Results:  Recent Results (from the past 24 hour(s))   CULTURE, WOUND W GRAM STAIN    Collection Time    08/11/11 10:15 AM       Component Value Range    Specimen Description: FOOT (LEFT)      Special Requests: NO SPECIAL REQUESTS      GRAM STAIN 1+ WBCS SEEN , 1+ APPARENT GRAM POSITIVE COCCI      Culture result: PENDING      Report Status PENDING     GLUCOSE, POC    Collection Time    08/11/11 11:32 AM       Component Value Range    POC GLUCOSE 100  75 - 110 mg/dL    Performed by Hetty Ely     GLUCOSE, POC    Collection Time    08/11/11  4:44 PM       Component Value Range    POC GLUCOSE 146 (*) 75 - 110 mg/dL  Performed by Hetty Ely     GLUCOSE, POC    Collection Time    08/11/11  8:45 PM       Component Value Range    POC GLUCOSE 141 (*) 75 - 110 mg/dL    Performed by KELLY PAULA     CBC WITH AUTOMATED DIFF    Collection Time    08/12/11  2:57 AM       Component Value Range    WBC 5.9  4.1 - 11.1 K/uL    RBC 3.86 (*) 4.10 - 5.70 M/uL    HGB 12.2  12.1 - 17.0 g/dL    HCT 16.1 (*) 09.6 - 50.3 %    MCV 89.4  80.0 - 99.0 FL    MCH 31.6  26.0 - 34.0 PG    MCHC 35.4  30.0 - 36.5 g/dL    RDW 04.5  40.9 - 81.1 %    PLATELET 301  150 - 400 K/uL    NEUTROPHILS 59  32 - 75 %    LYMPHOCYTES 29  12 - 49 %    MONOCYTES 7  5 - 13 %    EOSINOPHILS 5  0 - 7 %    BASOPHILS 0  0 - 1 %    ABS. NEUTROPHILS 3.5  1.8 - 8.0 K/UL    ABS. LYMPHOCYTES 1.7  0.8 - 3.5 K/UL    ABS. MONOCYTES 0.4  0.0 - 1.0 K/UL    ABS. EOSINOPHILS 0.3  0.0 - 0.4 K/UL    ABS. BASOPHILS 0.0  0.0 - 0.1 K/UL   METABOLIC PANEL, BASIC    Collection Time    08/12/11  2:57 AM       Component Value Range    Sodium 141  136 - 145 MMOL/L    Potassium 3.9  3.5 - 5.1 MMOL/L    Chloride 108  97 - 108 MMOL/L    CO2 22  21 - 32 MMOL/L    Anion gap 11  5 -  15 mmol/L    Glucose 103 (*) 65 - 100 MG/DL    BUN 11  6 - 20 MG/DL    Creatinine 9.14  7.82 - 1.15 MG/DL    BUN/Creatinine ratio 11 (*) 12 - 20      GFR est-AA >60  >60 ml/min/1.38m2    GFR est non-AA >60  >60 ml/min/1.72m2    Calcium 8.9  8.5 - 10.1 MG/DL   GLUCOSE, POC    Collection Time    08/12/11  6:14 AM       Component Value Range    POC GLUCOSE 100  75 - 110 mg/dL    Performed by NEASE LORI       Medications reviewed as per [facility]  Total time spent with patient: 30 minutes.    [place]. Erma Heritage, NP

## 2011-08-13 LAB — GLUCOSE, POC
Glucose (POC): 90 mg/dL (ref 75–110)
Glucose (POC): 104 mg/dL (ref 75–110)
Glucose (POC): 67 mg/dL — ABNORMAL LOW (ref 75–110)
Glucose (POC): 153 mg/dL — ABNORMAL HIGH (ref 75–110)
Glucose (POC): 94 mg/dL (ref 75–110)

## 2011-08-13 MED ORDER — PANTOPRAZOLE 40 MG TAB, DELAYED RELEASE
40 mg | Freq: Every day | ORAL | Status: DC
Start: 2011-08-13 — End: 2011-08-18
  Administered 2011-08-13 – 2011-08-18 (×6): via ORAL

## 2011-08-13 MED ADMIN — piperacillin-tazobactam (ZOSYN) 3.375 g in 0.9% sodium chloride (MBP/ADV) 100 mL MBP: INTRAVENOUS | NDC 00338055318

## 2011-08-13 MED ADMIN — piperacillin-tazobactam (ZOSYN) 3.375 g in 0.9% sodium chloride (MBP/ADV) 100 mL MBP: INTRAVENOUS | @ 12:00:00 | NDC 00338055318

## 2011-08-13 MED ADMIN — carvedilol (COREG) tablet 6.25 mg: ORAL | @ 21:00:00 | NDC 68084026211

## 2011-08-13 MED ADMIN — vancomycin (VANCOCIN) 1250 mg in NS 250 ml infusion: INTRAVENOUS | @ 08:00:00 | NDC 09999945625

## 2011-08-13 MED ADMIN — piperacillin-tazobactam (ZOSYN) 3.375 g in 0.9% sodium chloride (MBP/ADV) 100 mL MBP: INTRAVENOUS | @ 05:00:00 | NDC 00338055318

## 2011-08-13 MED ADMIN — insulin lispro (HUMALOG) injection: SUBCUTANEOUS | @ 17:00:00 | NDC 00002751017

## 2011-08-13 MED ADMIN — gabapentin (NEURONTIN) capsule 300 mg: ORAL | @ 03:00:00 | NDC 68084056311

## 2011-08-13 MED ADMIN — sodium chloride (NS) flush 5-10 mL: INTRAVENOUS | @ 08:00:00 | NDC 87701099893

## 2011-08-13 MED ADMIN — morphine injection 1 mg: INTRAVENOUS | @ 08:00:00 | NDC 00409189001

## 2011-08-13 MED ADMIN — pantoprazole (PROTONIX) tablet 40 mg: ORAL | @ 07:00:00 | NDC 00008060704

## 2011-08-13 MED ADMIN — sodium chloride (NS) flush 5-10 mL: INTRAVENOUS | @ 12:00:00 | NDC 87701099893

## 2011-08-13 MED ADMIN — HYDROcodone-acetaminophen (NORCO) 10-325 mg tablet 1 Tab: ORAL | @ 14:00:00 | NDC 68084035311

## 2011-08-13 MED ADMIN — morphine injection 1 mg: INTRAVENOUS | @ 19:00:00 | NDC 00409189001

## 2011-08-13 MED ADMIN — piperacillin-tazobactam (ZOSYN) 3.375 g in 0.9% sodium chloride (MBP/ADV) 100 mL MBP: INTRAVENOUS | @ 20:00:00 | NDC 00338055318

## 2011-08-13 MED ADMIN — sodium chloride (NS) flush 5-10 mL: INTRAVENOUS | @ 04:00:00 | NDC 87701099893

## 2011-08-13 MED ADMIN — 0.9% sodium chloride infusion: INTRAVENOUS | @ 20:00:00 | NDC 00409798302

## 2011-08-13 MED ADMIN — aspirin chewable tablet 81 mg: ORAL | @ 14:00:00 | NDC 63739043401

## 2011-08-13 MED ADMIN — vancomycin (VANCOCIN) 1250 mg in NS 250 ml infusion: INTRAVENOUS | @ 21:00:00 | NDC 09999945625

## 2011-08-13 MED ADMIN — 0.9% sodium chloride infusion: INTRAVENOUS | @ 01:00:00 | NDC 00409798309

## 2011-08-13 MED ADMIN — gabapentin (NEURONTIN) capsule 300 mg: ORAL | @ 14:00:00 | NDC 68084056311

## 2011-08-13 MED ADMIN — lisinopril (PRINIVIL, ZESTRIL) tablet 40 mg: ORAL | @ 14:00:00 | NDC 68084006211

## 2011-08-13 MED ADMIN — carvedilol (COREG) tablet 6.25 mg: ORAL | @ 14:00:00 | NDC 68084026211

## 2011-08-13 MED ADMIN — gabapentin (NEURONTIN) capsule 300 mg: ORAL | @ 21:00:00 | NDC 68084056311

## 2011-08-13 MED ADMIN — atorvastatin (LIPITOR) tablet 20 mg: ORAL | @ 03:00:00 | NDC 68084056511

## 2011-08-13 MED ADMIN — pregabalin (LYRICA) capsule 25 mg: ORAL | @ 17:00:00 | NDC 00071101268

## 2011-08-13 MED ADMIN — HYDROcodone-acetaminophen (NORCO) 10-325 mg tablet 1 Tab: ORAL | @ 04:00:00 | NDC 68084035311

## 2011-08-13 MED ADMIN — enoxaparin (LOVENOX) injection 40 mg: SUBCUTANEOUS | @ 01:00:00 | NDC 00075062040

## 2011-08-13 MED ADMIN — morphine injection 1 mg: INTRAVENOUS | @ 16:00:00 | NDC 00409189001

## 2011-08-13 MED FILL — MORPHINE 2 MG/ML INJECTION: 2 mg/mL | INTRAMUSCULAR | Qty: 1

## 2011-08-13 MED FILL — LYRICA 25 MG CAPSULE: 25 mg | ORAL | Qty: 1

## 2011-08-13 MED FILL — CARVEDILOL 6.25 MG TAB: 6.25 mg | ORAL | Qty: 1

## 2011-08-13 MED FILL — HYDROCODONE-ACETAMINOPHEN 10 MG-325 MG TAB: 10-325 mg | ORAL | Qty: 1

## 2011-08-13 MED FILL — GABAPENTIN 300 MG CAP: 300 mg | ORAL | Qty: 1

## 2011-08-13 MED FILL — BD POSIFLUSH NORMAL SALINE 0.9 % INJECTION SYRINGE: INTRAMUSCULAR | Qty: 10

## 2011-08-13 MED FILL — VANCOMYCIN IN 0.9 % SODIUM CHLORIDE 1.25 GRAM/250 ML IV: 1.25 gram/250 mL | INTRAVENOUS | Qty: 250

## 2011-08-13 MED FILL — LIPITOR 20 MG TABLET: 20 mg | ORAL | Qty: 1

## 2011-08-13 MED FILL — ZOSYN 3.375 GRAM INTRAVENOUS SOLUTION: 3.375 gram | INTRAVENOUS | Qty: 3.38

## 2011-08-13 MED FILL — PANTOPRAZOLE 40 MG TAB, DELAYED RELEASE: 40 mg | ORAL | Qty: 1

## 2011-08-13 MED FILL — LISINOPRIL 20 MG TAB: 20 mg | ORAL | Qty: 2

## 2011-08-13 MED FILL — BAYER CHILDRENS ASPIRIN 81 MG CHEWABLE TABLET: 81 mg | ORAL | Qty: 1

## 2011-08-13 MED FILL — INSULIN LISPRO 100 UNIT/ML INJECTION: 100 unit/mL | SUBCUTANEOUS | Qty: 1

## 2011-08-13 MED FILL — SODIUM CHLORIDE 0.9 % IV: INTRAVENOUS | Qty: 1000

## 2011-08-13 MED FILL — LOVENOX 40 MG/0.4 ML SUBCUTANEOUS SYRINGE: 40 mg/0.4 mL | SUBCUTANEOUS | Qty: 0.4

## 2011-08-13 NOTE — Progress Notes (Signed)
POD#1  No c/o  Left foot dressing intact    Continue care  Will leave dressing for now

## 2011-08-13 NOTE — Progress Notes (Signed)
Bedside and Verbal shift change report given to andrea sweeter rn covering spring pritchett lpn (oncoming nurse) by jacquelyn wolowic rn (offgoing nurse).  Report given with SBAR, Kardex, OR Summary, Intake/Output and MAR.

## 2011-08-13 NOTE — Progress Notes (Signed)
[place] called result of 08/11/2011 wound culture of left foot, shows Strep Beta hemolytic group B. Notified Spring Pritchett LPN.

## 2011-08-13 NOTE — Progress Notes (Signed)
Please see the more detailed note from the NP.  Patient was seen and examined.  I agree with NP's assessment and plan.

## 2011-08-13 NOTE — Progress Notes (Signed)
Hospitalist Progress Note          Norman Clay, NP  Romilda Joy [phone]  Call physician on-call through the operator 7pm-7am    Daily Progress Note: 08/13/2011  Assessment/Plan:   1. L infected diabetic foot: s/p I&D left foot with 5th metatarsal head resection 6/27. Attending: Dr. Manson Passey. ABIs are normal. CT scan indicating osteomyelitis. Initial wound culture + beta hemolytic strep, but awaiting surgical wound cultures. On Zosyn and vancomycin.  ID following.    2. DM type 2: Discussed with DTC this am, amaryl dose incorrect in computer on PTA medications and meds resumed post-op. Pt did not received amaryl today. HgbA1c: 5.8,  blood glucose well controlled. Pt will not likely need amaryl on discharge.  3. AKI: Creatinine continues to improve with gentle hydration.  4. Hypotension: Resolved, blood pressures back in 130's-140's. Noted lisinopril resumed at PTA dosage (40 lisinopril = 10 ramipril), had been decreased to 2.5mg  prior to surgery. Need to monitor this, will re-eval in am. Pt received dose this am.   5. Hx CAD: Cardiology following. ASA, coreg and continue statin.  6. Hx chronic systolic heart failure: No signs of decompensation. Appreciate cardiology input. Echo 6/24 EF 40%.  ACEi, coreg and ASA.  7. Hx peripheral neuropathy: Gabapentin TID. Pain continues, added lyrica today.    DVT Prophylaxis: As per Ortho.  Care Plan/Dispo: Will need placement for IV abx. He reports he is homeless and has no where to go. Case Management consult.  Discussed plan of care with pt and nurse.      Subjective:   Pt lying in bed, states she is hurting. Hard to redirect from his anger at [facility] physicians who did not treat his foot wound.     Review of Systems:   Denies HA. No chest pain or pressure. No respiratory or abdominal complaints. Tingling/burning pain in LE and hands.    Objective:   Physical Exam:   BP 135/79   Pulse 58   Temp 97.4 ??F (36.3 ??C)   Resp 16   Ht 6' (1.829 m)   Wt 193 lb  12.6 oz   BMI 26.28 kg/m2   SpO2 96% O2 Flow Rate (L/min): 2 l/min O2 Device: Room air  Temp (24hrs), Avg:97.5 ??F (36.4 ??C), Min:97.2 ??F (36.2 ??C), Max:97.9 ??F (36.6 ??C)        06/26 1900 - 06/28 0659  In: 1100 [I.V.:1100]  Out: 855 [Urine:850]  General:  Alert, cooperative, no acute distress.   HEENT: NC. Moist oral mucosa.   Lungs:   Clear to auscultation bilaterally.    Heart:  Regular rate.    Abdomen:   Soft, non-tender. Non distended. + bowel sounds.    Extremities/Skin: Palpable pulses, LLE dressing clean dry and intact.   Neurologic: A/O x 3 with no focal deficits. Speech Clear.    Psych: Cooperative. But increased agitation when medical therapies PTA are discussed.      Data Review:   24 Hour Results:  Recent Results (from the past 24 hour(s))   GLUCOSE, POC    Collection Time    08/12/11  4:00 PM       Component Value Range    POC GLUCOSE 91  75 - 110 mg/dL    Performed by Cordelia Pen     GLUCOSE, POC    Collection Time    08/12/11  5:20 PM       Component Value Range    POC GLUCOSE 93  75 - 110  mg/dL    Performed by Hetty Ely     CULTURE, WOUND [place] STAIN    Collection Time    08/12/11  7:57 PM       Component Value Range    Specimen Description: FOOT LEFT      Special Requests: NO SPECIAL REQUESTS      GRAM STAIN RARE WBCS SEEN , NO ORGANISMS SEEN      Culture result: NO GROWTH AFTER 14 HOURS      Report Status PENDING     GLUCOSE, POC    Collection Time    08/12/11  8:55 PM       Component Value Range    POC GLUCOSE 90  75 - 110 mg/dL    Performed by TIDD Cecelia     GLUCOSE, POC    Collection Time    08/13/11  6:19 AM       Component Value Range    POC GLUCOSE 67 (*) 75 - 110 mg/dL    Performed by NEASE LORI     GLUCOSE, POC    Collection Time    08/13/11  6:37 AM       Component Value Range    POC GLUCOSE 104  75 - 110 mg/dL    Performed by NEASE LORI     GLUCOSE, POC    Collection Time    08/13/11 11:30 AM       Component Value Range    POC GLUCOSE 153 (*) 75 - 110 mg/dL    Performed by Kathreen Devoid       Medications reviewed as per [facility]  Total time spent with patient: 30 minutes.    [place]. Erma Heritage, NP

## 2011-08-13 NOTE — Op Note (Signed)
Name:      Michael Lozano, Michael Lozano                                          Surgeon:        Stela Iwasaki Heath Miki Labuda,   MD  Account #: [id number]                 Surgery Date:   08/12/2011  DOB:       10/15/1934  Age:       77                           Location:                                 OPERATIVE REPORT          PREOPERATIVE DIAGNOSES:  1. Left diabetic foot infection.  2. Osteomyelitis, left 5th metatarsal head.    POSTOPERATIVE DIAGNOSES:  1. Left diabetic foot infection.  2. Osteomyelitis, left 5th metatarsal head.    PROCEDURES PERFORMED:  Resection, left 5th metatarsal head, with  debridement and closure of left diabetic foot ulcer.    SURGEON: Debanhi Blaker H Jasleen Riepe, MD.    ANESTHESIA: General.    ESTIMATED BLOOD LOSS:  Minimal.    COMPLICATIONS: None.    SPECIMENS REMOVED:  Fifth metatarsal head and cultures were sent to the lab  for analysis.    DISPOSITION: The patient was taken to the recovery room in stable  condition.    OPERATIVE INDICATIONS: The patient is a 77-year-old male with diabetes, who  presented to my office with a chronically draining wound underneath his  left fifth metatarsal head. He was admitted to the hospital and been seen  by medical services, including the hospitalist. He underwent a CT scan with  contrast as he was unable to have an MRI which suggests that he had fifth  metatarsal head osteomyelitis and a dislocated fifth MP joint. I  recommended resection of the 5th metatarsal head. He wished to keep the  fifth toe, expressed an understanding that it would be somewhat floppy  without the joint, but it was already dislocated. Informed consent was  obtained from the patient, and he wished to go forward with the above  procedure, including the risks and benefits.    DESCRIPTION OF PROCEDURE:  The patient identified in the preoperative  holding area. The left lower extremity was marked with the patient. All  preoperative questions were answered. He was seen by the anesthesia  department, taken  to the operating room, transferred to the operating table  in supine position. Once appropriate anesthesia was obtained, the left leg  was prepped and draped in usual sterile fashion. Time-out was conducted  indicating correct operative site and preoperative antibiotics given prior  to incision being made.    The foot was approached. There was a plantar lateral draining wound from  the 5th metatarsal head area. A hemostat was placed through this, which  went directly into the fifth MP joint. I made an incision on the  dorsolateral portion of the fifth metatarsal, dissected down to the bone  and found that the hemostat indeed was going directly into the joint. The  fifth metatarsal head bone was quite soft and friable, consistent with    osteomyelitis. An oscillating saw was used to remove the distal 1/3 of the  fifth metatarsal with the metatarsal head, and this was sent to the lab for  analysis. The toe itself was intact. It had been dislocated from the joint,  but now was stable. The whole area was thoroughly irrigated with saline  with bacitracin. Before this was done cultures were taken and sent to the  lab for analysis. There was appropriate bleeding throughout. There was  never any pus, abscess, or deep collection of fluid. Once it was fully  cleaned out, then there was enough excess skin that I was able to ellipse  out the ulcer area and wash through and through the surgical incision  through the ulcerated area, and then closed both of these wounds with nylon  sutures. Once they were closed, sterile dressings were placed. The patient  was awakened and taken to recovery room in stable condition.          Harini Dearmond Heath Deylan Canterbury, MD    cc:   Sarika Baldini Heath Tarnisha Kachmar, MD        MHB/wmx; D: 08/12/2011 08:05 P; T: 08/13/2011 10:56 A; Doc# [id number]; Job#  [id number]

## 2011-08-13 NOTE — Progress Notes (Signed)
Pt returned from pacu  Dressing dry and intact iv fluids infusing pt crying but no complaint of pain

## 2011-08-13 NOTE — Progress Notes (Signed)
Bedside and Verbal shift change report given to Spring (oncoming nurse) by Aminata Fornah, RN (offgoing nurse).  Report given with SBAR, Kardex and MAR.

## 2011-08-13 NOTE — Progress Notes (Signed)
Pt returned from or holding iv infusing pt remains npo pt very up set  Stating he has not had anything to eat in 16hrs

## 2011-08-13 NOTE — Other (Addendum)
DTC Progress Note    Recommendations/ Comments: Noted hypoglycemia on 08/13/11 at 0619 BG was 67mg /dl. Pt has not received any anti diabetic medications in the past 48hrs and BG range has been 67-91mg /dl. Pt's nurse reports that pt has been eating his meals.   Please consider:  1. Discontinuing Amaryl while in hospital and at home d/t A1C of 5.8%.   2. Switching Correction scale to High Sensitivity (coverage starts at 200mg /dl).     Reviewed pt's home medication for Diabetes. Per prescription bottle, pt takes 1mg  of Amaryl daily.     Discussed with Norman Clay, NP about above request. NP will review pt and chart and order as she sees fit.     Chart reviewed on [facility].      POC Glucose last 24hrs:   Lab Results   Component Value Date/Time    POC GLUCOSE 104 08/13/2011  6:37 AM    POC GLUCOSE 67 08/13/2011  6:19 AM    POC GLUCOSE 90 08/12/2011  8:55 PM    POC GLUCOSE 93 08/12/2011  5:20 PM    POC GLUCOSE 91 08/12/2011  4:00 PM    POC GLUCOSE 91 08/12/2011 11:30 AM    POC GLUCOSE 100 08/12/2011  6:14 AM    POC GLUCOSE 141 08/11/2011  8:45 PM         Lab Results   Component Value Date/Time    Creatinine 1.02 08/12/2011  2:57 AM           Current hospital DM medications: Amaryl 10mg  daily and Humalog Normal Sensitivity Correction scale AC and HS     Will continue to follow as needed.    Thank you.     Jeani Hawking RN,BSN,CDE  Pager: [phone]

## 2011-08-13 NOTE — Progress Notes (Signed)
Reviewed plan of care with spring pritchett lpn.

## 2011-08-13 NOTE — Op Note (Signed)
Name:      Michael Lozano, Michael Lozano                                          Surgeon:        Daymon Larsen,   MD  Account #: [id number]                 Surgery Date:   08/12/2011  DOB:       1934-10-18  Age:       77                           Location:                                 OPERATIVE REPORT          PREOPERATIVE DIAGNOSES:  1. Left diabetic foot infection.  2. Osteomyelitis, left 5th metatarsal head.    POSTOPERATIVE DIAGNOSES:  1. Left diabetic foot infection.  2. Osteomyelitis, left 5th metatarsal head.    PROCEDURES PERFORMED:  Resection, left 5th metatarsal head, with  debridement and closure of left diabetic foot ulcer.    SURGEON: Cathlean Cower, MD.    ANESTHESIA: General.    ESTIMATED BLOOD LOSS:  Minimal.    COMPLICATIONS: None.    SPECIMENS REMOVED:  Fifth metatarsal head and cultures were sent to the lab  for analysis.    DISPOSITION: The patient was taken to the recovery room in stable  condition.    OPERATIVE INDICATIONS: The patient is a 77 year old male with diabetes, who  presented to my office with a chronically draining wound underneath his  left fifth metatarsal head. He was admitted to the hospital and been seen  by medical services, including the hospitalist. He underwent a CT scan with  contrast as he was unable to have an MRI which suggests that he had fifth  metatarsal head osteomyelitis and a dislocated fifth MP joint. I  recommended resection of the 5th metatarsal head. He wished to keep the  fifth toe, expressed an understanding that it would be somewhat floppy  without the joint, but it was already dislocated. Informed consent was  obtained from the patient, and he wished to go forward with the above  procedure, including the risks and benefits.    DESCRIPTION OF PROCEDURE:  The patient identified in the preoperative  holding area. The left lower extremity was marked with the patient. All  preoperative questions were answered. He was seen by the anesthesia  department, taken  to the operating room, transferred to the operating table  in supine position. Once appropriate anesthesia was obtained, the left leg  was prepped and draped in usual sterile fashion. Time-out was conducted  indicating correct operative site and preoperative antibiotics given prior  to incision being made.    The foot was approached. There was a plantar lateral draining wound from  the 5th metatarsal head area. A hemostat was placed through this, which  went directly into the fifth MP joint. I made an incision on the  dorsolateral portion of the fifth metatarsal, dissected down to the bone  and found that the hemostat indeed was going directly into the joint. The  fifth metatarsal head bone was quite soft and friable, consistent with  osteomyelitis. An oscillating saw was used to remove the distal 1/3 of the  fifth metatarsal with the metatarsal head, and this was sent to the lab for  analysis. The toe itself was intact. It had been dislocated from the joint,  but now was stable. The whole area was thoroughly irrigated with saline  with bacitracin. Before this was done cultures were taken and sent to the  lab for analysis. There was appropriate bleeding throughout. There was  never any pus, abscess, or deep collection of fluid. Once it was fully  cleaned out, then there was enough excess skin that I was able to ellipse  out the ulcer area and wash through and through the surgical incision  through the ulcerated area, and then closed both of these wounds with nylon  sutures. Once they were closed, sterile dressings were placed. The patient  was awakened and taken to recovery room in stable condition.          Daymon Larsen, MD    cc:   Daymon Larsen, MD        MHB/wmx; D: 08/12/2011 08:05 P; T: 08/13/2011 10:56 A; Doc# [id number]; Job#  [id number]

## 2011-08-14 LAB — GLUCOSE, POC
Glucose (POC): 126 mg/dL — ABNORMAL HIGH (ref 75–110)
Glucose (POC): 124 mg/dL — ABNORMAL HIGH (ref 75–110)
Glucose (POC): 154 mg/dL — ABNORMAL HIGH (ref 75–110)
Glucose (POC): 75 mg/dL (ref 75–110)
Glucose (POC): 110 mg/dL (ref 75–110)

## 2011-08-14 LAB — METABOLIC PANEL, BASIC
Anion gap: 10 mmol/L (ref 5–15)
BUN/Creatinine ratio: 8 — ABNORMAL LOW (ref 12–20)
BUN: 9 MG/DL (ref 6–20)
CO2: 20 MMOL/L — ABNORMAL LOW (ref 21–32)
Calcium: 8.8 MG/DL (ref 8.5–10.1)
Chloride: 110 MMOL/L — ABNORMAL HIGH (ref 97–108)
Creatinine: 1.07 MG/DL (ref 0.45–1.15)
GFR est AA: >60 mL/min/{1.73_m2} (ref >60)
GFR est non-AA: >60 mL/min/{1.73_m2} (ref >60)
Glucose: 104 MG/DL — ABNORMAL HIGH (ref 65–100)
Potassium: 3.8 MMOL/L (ref 3.5–5.1)
Sodium: 140 MMOL/L (ref 136–145)

## 2011-08-14 LAB — CBC WITH AUTOMATED DIFF
ABS. BASOPHILS: 0 10*3/uL (ref 0.0–0.1)
ABS. EOSINOPHILS: 0.3 10*3/uL (ref 0.0–0.4)
ABS. LYMPHOCYTES: 1.1 10*3/uL (ref 0.8–3.5)
ABS. MONOCYTES: 0.4 10*3/uL (ref 0.0–1.0)
ABS. NEUTROPHILS: 3.2 10*3/uL (ref 1.8–8.0)
BASOPHILS: 0 % (ref 0–1)
EOSINOPHILS: 6 % (ref 0–7)
HCT: 33.9 % — ABNORMAL LOW (ref 36.6–50.3)
HGB: 11.9 g/dL — ABNORMAL LOW (ref 12.1–17.0)
LYMPHOCYTES: 22 % (ref 12–49)
MCH: 31.2 PG (ref 26.0–34.0)
MCHC: 35.1 g/dL (ref 30.0–36.5)
MCV: 88.7 FL (ref 80.0–99.0)
MONOCYTES: 8 % (ref 5–13)
NEUTROPHILS: 64 % (ref 32–75)
PLATELET: 292 10*3/uL (ref 150–400)
RBC: 3.82 M/uL — ABNORMAL LOW (ref 4.10–5.70)
RDW: 13 % (ref 11.5–14.5)
WBC: 5 10*3/uL (ref 4.1–11.1)

## 2011-08-14 LAB — CULTURE, WOUND W GRAM STAIN

## 2011-08-14 LAB — CULTURE, ANAEROBIC

## 2011-08-14 MED ADMIN — piperacillin-tazobactam (ZOSYN) 3.375 g in 0.9% sodium chloride (MBP/ADV) 100 mL MBP: INTRAVENOUS | @ 20:00:00 | NDC 00338055318

## 2011-08-14 MED ADMIN — vancomycin (VANCOCIN) 1250 mg in NS 250 ml infusion: INTRAVENOUS | @ 09:00:00 | NDC 09999945625

## 2011-08-14 MED ADMIN — senna-docusate (PERICOLACE) 8.6-50 mg per tablet 1 Tab: ORAL | @ 22:00:00 | NDC 00904564361

## 2011-08-14 MED ADMIN — gabapentin (NEURONTIN) capsule 300 mg: ORAL | @ 01:00:00 | NDC 68084056311

## 2011-08-14 MED ADMIN — aspirin chewable tablet 81 mg: ORAL | @ 13:00:00 | NDC 63739043401

## 2011-08-14 MED ADMIN — piperacillin-tazobactam (ZOSYN) 3.375 g in 0.9% sodium chloride (MBP/ADV) 100 mL MBP: INTRAVENOUS | @ 12:00:00 | NDC 00338055318

## 2011-08-14 MED ADMIN — piperacillin-tazobactam (ZOSYN) 3.375 g in 0.9% sodium chloride (MBP/ADV) 100 mL MBP: INTRAVENOUS | @ 01:00:00 | NDC 00338055318

## 2011-08-14 MED ADMIN — carvedilol (COREG) tablet 6.25 mg: ORAL | @ 13:00:00 | NDC 68084026211

## 2011-08-14 MED ADMIN — gabapentin (NEURONTIN) capsule 300 mg: ORAL | @ 22:00:00 | NDC 68084056311

## 2011-08-14 MED ADMIN — bisacodyl (DULCOLAX) tablet 5 mg: ORAL | @ 20:00:00 | NDC 00904792760

## 2011-08-14 MED ADMIN — piperacillin-tazobactam (ZOSYN) 3.375 g in 0.9% sodium chloride (MBP/ADV) 100 mL MBP: INTRAVENOUS | @ 23:00:00 | NDC 00338055318

## 2011-08-14 MED ADMIN — carvedilol (COREG) tablet 6.25 mg: ORAL | @ 22:00:00 | NDC 68084026211

## 2011-08-14 MED ADMIN — piperacillin-tazobactam (ZOSYN) 3.375 g in 0.9% sodium chloride (MBP/ADV) 100 mL MBP: INTRAVENOUS | @ 06:00:00 | NDC 00338055318

## 2011-08-14 MED ADMIN — sodium chloride (NS) flush 5-10 mL: INTRAVENOUS | @ 10:00:00 | NDC 82903065462

## 2011-08-14 MED ADMIN — sodium chloride (NS) flush 5-10 mL: INTRAVENOUS | @ 18:00:00 | NDC 82903065462

## 2011-08-14 MED ADMIN — atorvastatin (LIPITOR) tablet 20 mg: ORAL | @ 01:00:00 | NDC 68084056511

## 2011-08-14 MED ADMIN — enoxaparin (LOVENOX) injection 40 mg: SUBCUTANEOUS | @ 01:00:00 | NDC 00075062040

## 2011-08-14 MED ADMIN — lisinopril (PRINIVIL, ZESTRIL) tablet 40 mg: ORAL | @ 13:00:00 | NDC 68084006211

## 2011-08-14 MED ADMIN — gabapentin (NEURONTIN) capsule 300 mg: ORAL | @ 13:00:00 | NDC 68084056311

## 2011-08-14 MED ADMIN — pregabalin (LYRICA) capsule 25 mg: ORAL | @ 13:00:00 | NDC 00071101268

## 2011-08-14 MED FILL — CARVEDILOL 6.25 MG TAB: 6.25 mg | ORAL | Qty: 1

## 2011-08-14 MED FILL — LYRICA 25 MG CAPSULE: 25 mg | ORAL | Qty: 1

## 2011-08-14 MED FILL — ZOSYN 3.375 GRAM INTRAVENOUS SOLUTION: 3.375 gram | INTRAVENOUS | Qty: 3.38

## 2011-08-14 MED FILL — LISINOPRIL 20 MG TAB: 20 mg | ORAL | Qty: 2

## 2011-08-14 MED FILL — GABAPENTIN 300 MG CAP: 300 mg | ORAL | Qty: 1

## 2011-08-14 MED FILL — LOVENOX 40 MG/0.4 ML SUBCUTANEOUS SYRINGE: 40 mg/0.4 mL | SUBCUTANEOUS | Qty: 0.4

## 2011-08-14 MED FILL — INSULIN LISPRO 100 UNIT/ML INJECTION: 100 unit/mL | SUBCUTANEOUS | Qty: 1

## 2011-08-14 MED FILL — LIPITOR 20 MG TABLET: 20 mg | ORAL | Qty: 1

## 2011-08-14 MED FILL — PROTONIX 40 MG TABLET,DELAYED RELEASE: 40 mg | ORAL | Qty: 1

## 2011-08-14 MED FILL — BD POSIFLUSH NORMAL SALINE 0.9 % INJECTION SYRINGE: INTRAMUSCULAR | Qty: 20

## 2011-08-14 MED FILL — BAYER CHILDRENS ASPIRIN 81 MG CHEWABLE TABLET: 81 mg | ORAL | Qty: 1

## 2011-08-14 MED FILL — BISACODYL 5 MG TAB, DELAYED RELEASE: 5 mg | ORAL | Qty: 1

## 2011-08-14 MED FILL — VANCOMYCIN IN 0.9 % SODIUM CHLORIDE 1.25 GRAM/250 ML IV: 1.25 gram/250 mL | INTRAVENOUS | Qty: 250

## 2011-08-14 MED FILL — SENNA PLUS 8.6 MG-50 MG TABLET: ORAL | Qty: 1

## 2011-08-14 NOTE — Progress Notes (Signed)
Michael Lozano is a 77 y.o. with a history of CAD, , DM, HTN   Admitted with lt plantar wound which has been present for many months-   Had amputation of the metatarsal head of the 5 th toe and debridement of the wound  He is feeling better  :   Objective:    VITALS: 98.6 ??F (37 ??C) 60 140/70       PHYSICAL EXAM:   General: Alert, , no distress, appears stated age.   Head: Normocephalic, without obvious abnormality, atraumatic.   Eyes: Conjunctivae clear, anicteric sclerae. Pupils are equal   Nose: Nares normal. No drainage or sinus tenderness.   Throat: Lips, mucosa, and tongue normal. No Thrush   Neck: Supple,   Lungs: Clear to auscultation bilaterally.   Heart: Regular rate and rhythm,   Abdomen: Soft, non-tender,not distended. Bowel sounds normal. No masses   Extremities:lt foot- less swollen and erythematous- surgical site- clean with minimal dischargeSkin: No rashes or lesions. Not Jaundiced   Neurologic: Grossly non-focal   Pertinent Labs   Wbc 12.4 -now 5.9   Cr 1.02   Micro- wound culture GBS   IMAGING RESULTS:   Plantar ulcer overlying fifth metatarsal head with osteomyelitis   of the metatarsal head. Dorsal dislocation of fifth metatarsal phalangeal   joint.    Impression/Recommendation   77 yr male with h/o dm presents with chronic foot ulcer   1) Diabetic foot ulcer with osteomyelitis of the 5th metatarsal   Underwent debridement and resection of the 5th metarsal head-group B strep from non surgical culture  Surgical culture neg so far   d/c vanco --  Will change zosyn  to unasyn by tomorrow    2) DM -management as per primary team     Discussed the plan with him

## 2011-08-14 NOTE — Progress Notes (Signed)
Left foot dressing changed  Looks good    May go home from my standpoint  Surgical cultures no growth but pt was on abx for several days preop  abx per ID  And dispo plans per case management    I would see in office in 2 weeks to check wound and remove sutures

## 2011-08-14 NOTE — Progress Notes (Signed)
Hospitalist Progress Note          [name], MD   Michael Lozano [phone]  Call physician on-call through the operator 7pm-7am    Daily Progress Note: 08/14/2011  Assessment/Plan:   1. L infected diabetic foot: s/p I&D left foot with 5th metatarsal head resection 6/27. Attending: Dr. Manson Passey. ABIs are normal. CT scan indicating osteomyelitis. Initial wound culture + beta hemolytic strep,NG surgical wound cultures. Was On Zosyn and vancomycin. D/c vanco 6/27 ID following.    2. DM type 2:  amaryl dose incorrect in computer on PTA medications and meds resumed post-op. Pt did not received amaryl today. HgbA1c: 5.8,  blood glucose well controlled. Pt will not likely need amaryl on discharge.  3. AKI: Creatinine continues to improve with gentle hydration.  4. Hypotension: Resolved, blood pressures back in 130's-140's. Noted lisinopril resumed at PTA dosage (40 lisinopril = 10 ramipril), had been decreased to 2.5mg  prior to surgery. BP stable , continue current dose of lisinopril   5. Hx CAD: Cardiology following. ASA, coreg and continue statin.  6. Hx chronic systolic heart failure: No signs of decompensation. Appreciate cardiology input. Echo 6/24 EF 40%.  ACEi, coreg and ASA.  7. Hx peripheral neuropathy: Gabapentin TID. Pain continues, added lyrica , doing well     DVT Prophylaxis: As per Ortho.  Care Plan/Dispo: Will need placement for IV abx. He reports he is homeless and has no where to go. Case Management consult.  Discussed plan of care with pt and nurse.   Dispo : plan pend ID, CM     Will follow prn , please call with questions      Subjective:   Pt lying in bed, says he is doing well , burning is better     Review of Systems:   Denies HA. No chest pain or pressure. No respiratory or abdominal complaints. Tingling/burning pain in LE and hands.    Objective:   Physical Exam:   BP 121/61   Pulse 60   Temp 98.2 ??F (36.8 ??C)   Resp 18   Ht 6' (1.829 m)   Wt 194 lb 0.1 oz    BMI 26.31 kg/m2   SpO2 98% O2 Flow Rate (L/min): 2 l/min O2 Device: Room air  Temp (24hrs), Avg:98.2 ??F (36.8 ??C), Min:97.8 ??F (36.6 ??C), Max:98.6 ??F (37 ??C)    06/29 0700 - 06/29 1859  In: -   Out: 1 [Urine:1]   06/27 1900 - 06/29 0659  In: 1100 [I.V.:1100]  Out: 859 [Urine:854]  General:  Alert, cooperative, no acute distress.   HEENT: NC. Moist oral mucosa.   Lungs:   Clear to auscultation bilaterally.    Heart:  Regular rate.    Abdomen:   Soft, non-tender. Non distended. + bowel sounds.    Extremities/Skin: Palpable pulses, LLE dressing clean dry and intact.   Neurologic: A/O x 3 with no focal deficits. Speech Clear.    Psych: Cooperative. But increased agitation when medical therapies PTA are discussed.      Data Review:   24 Hour Results:  Recent Results (from the past 24 hour(s))   GLUCOSE, POC    Collection Time    08/13/11 11:30 AM       Component Value Range    POC GLUCOSE 153 (*) 75 - 110 mg/dL    Performed by Kathreen Devoid     GLUCOSE, POC    Collection Time    08/13/11  5:05 PM  Component Value Range    POC GLUCOSE 94  75 - 110 mg/dL    Performed by Hetty Ely     GLUCOSE, POC    Collection Time    08/13/11  9:14 PM       Component Value Range    POC GLUCOSE 126 (*) 75 - 110 mg/dL    Performed by Dot Been     METABOLIC PANEL, BASIC    Collection Time    08/14/11  4:14 AM       Component Value Range    Sodium 140  136 - 145 MMOL/L    Potassium 3.8  3.5 - 5.1 MMOL/L    Chloride 110 (*) 97 - 108 MMOL/L    CO2 20 (*) 21 - 32 MMOL/L    Anion gap 10  5 - 15 mmol/L    Glucose 104 (*) 65 - 100 MG/DL    BUN 9  6 - 20 MG/DL    Creatinine 9.60  4.54 - 1.15 MG/DL    BUN/Creatinine ratio 8 (*) 12 - 20      GFR est-AA >60  >60 ml/min/1.65m2    GFR est non-AA >60  >60 ml/min/1.22m2    Calcium 8.8  8.5 - 10.1 MG/DL   CBC WITH AUTOMATED DIFF    Collection Time    08/14/11  4:14 AM       Component Value Range    WBC 5.0  4.1 - 11.1 K/uL    RBC 3.82 (*) 4.10 - 5.70 M/uL    HGB 11.9 (*) 12.1 - 17.0 g/dL     HCT 09.8 (*) 11.9 - 50.3 %    MCV 88.7  80.0 - 99.0 FL    MCH 31.2  26.0 - 34.0 PG    MCHC 35.1  30.0 - 36.5 g/dL    RDW 14.7  82.9 - 56.2 %    PLATELET 292  150 - 400 K/uL    NEUTROPHILS 64  32 - 75 %    LYMPHOCYTES 22  12 - 49 %    MONOCYTES 8  5 - 13 %    EOSINOPHILS 6  0 - 7 %    BASOPHILS 0  0 - 1 %    ABS. NEUTROPHILS 3.2  1.8 - 8.0 K/UL    ABS. LYMPHOCYTES 1.1  0.8 - 3.5 K/UL    ABS. MONOCYTES 0.4  0.0 - 1.0 K/UL    ABS. EOSINOPHILS 0.3  0.0 - 0.4 K/UL    ABS. BASOPHILS 0.0  0.0 - 0.1 K/UL   GLUCOSE, POC    Collection Time    08/14/11  6:13 AM       Component Value Range    POC GLUCOSE 124 (*) 75 - 110 mg/dL    Performed by Dot Been       Medications reviewed as per [facility]  Total time spent with patient: 30 minutes.    Danelle Curiale Karren Cobble, MD

## 2011-08-14 NOTE — Progress Notes (Signed)
Bedside and Verbal shift change report given to Angie RN (oncoming nurse) by Becky RN (offgoing nurse).  Report given with SBAR and Intake/Output.

## 2011-08-14 NOTE — Progress Notes (Addendum)
..  Bedside and Verbal shift change report given to Becky (oncoming nurse) by Angie (offgoing nurse).  Report given with SBAR.

## 2011-08-15 LAB — GLUCOSE, POC
Glucose (POC): 93 mg/dL (ref 75–110)
Glucose (POC): 122 mg/dL — ABNORMAL HIGH (ref 75–110)
Glucose (POC): 99 mg/dL (ref 75–110)
Glucose (POC): 100 mg/dL (ref 75–110)

## 2011-08-15 MED ADMIN — piperacillin-tazobactam (ZOSYN) 3.375 g in 0.9% sodium chloride (MBP/ADV) 100 mL MBP: INTRAVENOUS | @ 17:00:00 | NDC 00338055318

## 2011-08-15 MED ADMIN — piperacillin-tazobactam (ZOSYN) 3.375 g in 0.9% sodium chloride (MBP/ADV) 100 mL MBP: INTRAVENOUS | @ 11:00:00 | NDC 00338055318

## 2011-08-15 MED ADMIN — piperacillin-tazobactam (ZOSYN) 3.375 g in 0.9% sodium chloride (MBP/ADV) 100 mL MBP: INTRAVENOUS | @ 23:00:00 | NDC 00338055318

## 2011-08-15 MED ADMIN — gabapentin (NEURONTIN) capsule 300 mg: ORAL | @ 13:00:00 | NDC 68084056311

## 2011-08-15 MED ADMIN — piperacillin-tazobactam (ZOSYN) 3.375 g in 0.9% sodium chloride (MBP/ADV) 100 mL MBP: INTRAVENOUS | @ 05:00:00 | NDC 00338055318

## 2011-08-15 MED ADMIN — gabapentin (NEURONTIN) capsule 300 mg: ORAL | @ 22:00:00 | NDC 68084056311

## 2011-08-15 MED ADMIN — oxyCODONE IR (ROXICODONE) tablet 10 mg: ORAL | @ 05:00:00 | NDC 00406055223

## 2011-08-15 MED ADMIN — enoxaparin (LOVENOX) injection 40 mg: SUBCUTANEOUS | @ 01:00:00 | NDC 00075062040

## 2011-08-15 MED ADMIN — carvedilol (COREG) tablet 6.25 mg: ORAL | @ 22:00:00 | NDC 68084026211

## 2011-08-15 MED ADMIN — oxyCODONE IR (ROXICODONE) tablet 10 mg: ORAL | @ 11:00:00 | NDC 00406055223

## 2011-08-15 MED ADMIN — carvedilol (COREG) tablet 6.25 mg: ORAL | @ 13:00:00 | NDC 68084026211

## 2011-08-15 MED ADMIN — pregabalin (LYRICA) capsule 25 mg: ORAL | @ 13:00:00 | NDC 00071101268

## 2011-08-15 MED ADMIN — sodium chloride (NS) flush 5-10 mL: INTRAVENOUS | @ 18:00:00 | NDC 87701099893

## 2011-08-15 MED ADMIN — senna-docusate (PERICOLACE) 8.6-50 mg per tablet 1 Tab: ORAL | @ 13:00:00 | NDC 00904564361

## 2011-08-15 MED ADMIN — oxyCODONE IR (ROXICODONE) tablet 10 mg: ORAL | @ 01:00:00 | NDC 00406055223

## 2011-08-15 MED ADMIN — gabapentin (NEURONTIN) capsule 300 mg: ORAL | @ 01:00:00 | NDC 68084056311

## 2011-08-15 MED ADMIN — sodium chloride (NS) flush 5-10 mL: INTRAVENOUS | @ 10:00:00 | NDC 87701099893

## 2011-08-15 MED ADMIN — lisinopril (PRINIVIL, ZESTRIL) tablet 40 mg: ORAL | @ 13:00:00 | NDC 68084006211

## 2011-08-15 MED ADMIN — aspirin chewable tablet 81 mg: ORAL | @ 13:00:00 | NDC 63739043401

## 2011-08-15 MED ADMIN — atorvastatin (LIPITOR) tablet 20 mg: ORAL | @ 01:00:00 | NDC 68084056511

## 2011-08-15 MED FILL — SENNA PLUS 8.6 MG-50 MG TABLET: ORAL | Qty: 1

## 2011-08-15 MED FILL — BAYER CHILDRENS ASPIRIN 81 MG CHEWABLE TABLET: 81 mg | ORAL | Qty: 1

## 2011-08-15 MED FILL — LOVENOX 40 MG/0.4 ML SUBCUTANEOUS SYRINGE: 40 mg/0.4 mL | SUBCUTANEOUS | Qty: 0.4

## 2011-08-15 MED FILL — ZOSYN 3.375 GRAM INTRAVENOUS SOLUTION: 3.375 gram | INTRAVENOUS | Qty: 3.38

## 2011-08-15 MED FILL — OXYCODONE 5 MG TAB: 5 mg | ORAL | Qty: 1

## 2011-08-15 MED FILL — BD POSIFLUSH NORMAL SALINE 0.9 % INJECTION SYRINGE: INTRAMUSCULAR | Qty: 10

## 2011-08-15 MED FILL — LIPITOR 20 MG TABLET: 20 mg | ORAL | Qty: 1

## 2011-08-15 MED FILL — PANTOPRAZOLE 40 MG TAB, DELAYED RELEASE: 40 mg | ORAL | Qty: 1

## 2011-08-15 MED FILL — GABAPENTIN 300 MG CAP: 300 mg | ORAL | Qty: 1

## 2011-08-15 MED FILL — LYRICA 25 MG CAPSULE: 25 mg | ORAL | Qty: 1

## 2011-08-15 MED FILL — OXYCODONE 5 MG TAB: 5 mg | ORAL | Qty: 2

## 2011-08-15 MED FILL — LISINOPRIL 20 MG TAB: 20 mg | ORAL | Qty: 2

## 2011-08-15 MED FILL — CARVEDILOL 6.25 MG TAB: 6.25 mg | ORAL | Qty: 1

## 2011-08-15 MED FILL — SODIUM CHLORIDE 0.9 % IV PIGGY BACK: INTRAVENOUS | Qty: 100

## 2011-08-15 NOTE — Progress Notes (Signed)
Bedside and Verbal shift change report given to Sha RN (oncoming nurse) by Becky RN (offgoing nurse).  Report given with SBAR and Intake/Output.

## 2011-08-15 NOTE — Progress Notes (Signed)
Michael Lozano is a 77 y.o. with a history of CAD, , DM, HTN   Admitted with lt plantar wound which has been present for many months-   Had amputation of the metatarsal head of the 5 th toe and debridement of the wound   He is feeling better   :   Objective:    VITALS:  97.7 ??F (36.5 ??C) 60 141/83 mmHg    PHYSICAL EXAM:   General: Alert, , no distress, appears stated age.   Lungs: Clear to auscultation bilaterally.   Heart: Regular rate and rhythm,   Abdomen: Soft, non-tender,not distended. Bowel sounds normal. No masses   Extremities:lt foot- less swollen and erythematous- surgical site- clean with minimal dischargeSkin: No rashes or lesions. Not Jaundiced   Neurologic: Grossly non-focal   Pertinent Labs   Wbc 12.4 -now 5.0  Cr 1.07   Micro- wound culture GBS   IMAGING RESULTS:   Plantar ulcer overlying fifth metatarsal head with osteomyelitis   of the metatarsal head. Dorsal dislocation of fifth metatarsal phalangeal   joint.    Impression/Recommendation   77 yr male with h/o dm presents with chronic foot ulcer   1) Diabetic foot ulcer with osteomyelitis of the 5th metatarsal   Underwent debridement and resection of the 5th metarsal head-group B strep from non surgical culture   Surgical culture only coag neg staph in thio broth- a contaminant and not a true pathogen   Will change zosyn to Rocephin - await HPE- to confirm osteo- if positive will need IV for another 3 weeks followed by PO for another 2 weeks  2) DM -management as per primary team

## 2011-08-15 NOTE — Progress Notes (Signed)
Pt evaluated. Dressing cdi. Can be dced to whence he came from

## 2011-08-15 NOTE — Progress Notes (Signed)
..  Bedside and Verbal shift change report given to Becky (oncoming nurse) by Angie (offgoing nurse).  Report given with SBAR.

## 2011-08-16 DIAGNOSIS — M86171 Other acute osteomyelitis, right ankle and foot: Secondary | ICD-10-CM | POA: Insufficient documentation

## 2011-08-16 LAB — METABOLIC PANEL, BASIC
Anion gap: 6 mmol/L (ref 5–15)
BUN/Creatinine ratio: 6 — ABNORMAL LOW (ref 12–20)
BUN: 6 MG/DL (ref 6–20)
CO2: 24 MMOL/L (ref 21–32)
Calcium: 8.8 MG/DL (ref 8.5–10.1)
Chloride: 109 MMOL/L — ABNORMAL HIGH (ref 97–108)
Creatinine: 1.03 MG/DL (ref 0.45–1.15)
GFR est AA: >60 mL/min/{1.73_m2} (ref >60)
GFR est non-AA: >60 mL/min/{1.73_m2} (ref >60)
Glucose: 82 MG/DL (ref 65–100)
Potassium: 3.9 MMOL/L (ref 3.5–5.1)
Sodium: 139 MMOL/L (ref 136–145)

## 2011-08-16 LAB — GLUCOSE, POC
Glucose (POC): 111 mg/dL — ABNORMAL HIGH (ref 75–110)
Glucose (POC): 86 mg/dL (ref 75–110)
Glucose (POC): 94 mg/dL (ref 75–110)
Glucose (POC): 90 mg/dL (ref 75–110)

## 2011-08-16 MED ORDER — HYDROCODONE-ACETAMINOPHEN 5 MG-325 MG TAB
5-325 mg | ORAL_TABLET | ORAL | Status: DC | PRN
Start: 2011-08-16 — End: 2019-05-18

## 2011-08-16 MED ADMIN — carvedilol (COREG) tablet 6.25 mg: ORAL | @ 13:00:00 | NDC 51079093001

## 2011-08-16 MED ADMIN — aspirin chewable tablet 81 mg: ORAL | @ 13:00:00

## 2011-08-16 MED ADMIN — senna-docusate (PERICOLACE) 8.6-50 mg per tablet 1 Tab: ORAL | @ 13:00:00 | NDC 00904551280

## 2011-08-16 MED ADMIN — sodium chloride (NS) flush 5-10 mL: INTRAVENOUS | @ 10:00:00 | NDC 87701099893

## 2011-08-16 MED ADMIN — cefTRIAXone (ROCEPHIN) 2 g in 0.9% sodium chloride (MBP/ADV) 50 mL MBP: INTRAVENOUS | @ 11:00:00 | NDC 00409733503

## 2011-08-16 MED ADMIN — sodium chloride (NS) flush 5-10 mL: INTRAVENOUS | @ 18:00:00 | NDC 87701099893

## 2011-08-16 MED ADMIN — gabapentin (NEURONTIN) capsule 300 mg: ORAL | @ 21:00:00 | NDC 68084056311

## 2011-08-16 MED ADMIN — sodium chloride (NS) flush 10 mL: @ 18:00:00 | NDC 87701099893

## 2011-08-16 MED ADMIN — gabapentin (NEURONTIN) capsule 300 mg: ORAL | @ 01:00:00 | NDC 68084056311

## 2011-08-16 MED ADMIN — enoxaparin (LOVENOX) injection 40 mg: SUBCUTANEOUS | @ 01:00:00 | NDC 00075062040

## 2011-08-16 MED ADMIN — gabapentin (NEURONTIN) capsule 300 mg: ORAL | @ 13:00:00 | NDC 53746010205

## 2011-08-16 MED ADMIN — pregabalin (LYRICA) capsule 25 mg: ORAL | @ 13:00:00 | NDC 00071101268

## 2011-08-16 MED ADMIN — lisinopril (PRINIVIL, ZESTRIL) tablet 40 mg: ORAL | @ 13:00:00 | NDC 00185010210

## 2011-08-16 MED ADMIN — carvedilol (COREG) tablet 6.25 mg: ORAL | @ 21:00:00 | NDC 68084026211

## 2011-08-16 MED ADMIN — atorvastatin (LIPITOR) tablet 20 mg: ORAL | @ 01:00:00 | NDC 68084056511

## 2011-08-16 MED ADMIN — sodium chloride (NS) flush 5-10 mL: INTRAVENOUS | @ 02:00:00 | NDC 87701099893

## 2011-08-16 MED ADMIN — oxyCODONE IR (ROXICODONE) tablet 10 mg: ORAL | @ 13:00:00 | NDC 68084035411

## 2011-08-16 MED ADMIN — sodium chloride (NS) flush 10 mL: @ 17:00:00 | NDC 82903065462

## 2011-08-16 MED FILL — LYRICA 25 MG CAPSULE: 25 mg | ORAL | Qty: 1

## 2011-08-16 MED FILL — GABAPENTIN 300 MG CAP: 300 mg | ORAL | Qty: 1

## 2011-08-16 MED FILL — SENNA PLUS 8.6 MG-50 MG TABLET: ORAL | Qty: 1

## 2011-08-16 MED FILL — BAYER CHILDRENS ASPIRIN 81 MG CHEWABLE TABLET: 81 mg | ORAL | Qty: 1

## 2011-08-16 MED FILL — PNEUMOVAX-23 25 MCG/0.5 ML INJECTION SOLUTION: 25 mcg/0.5 mL | INTRAMUSCULAR | Qty: 0.5

## 2011-08-16 MED FILL — OXYCODONE 5 MG TAB: 5 mg | ORAL | Qty: 2

## 2011-08-16 MED FILL — ZOSYN 3.375 GRAM INTRAVENOUS SOLUTION: 3.375 gram | INTRAVENOUS | Qty: 3.38

## 2011-08-16 MED FILL — PANTOPRAZOLE 40 MG TAB, DELAYED RELEASE: 40 mg | ORAL | Qty: 1

## 2011-08-16 MED FILL — LIPITOR 20 MG TABLET: 20 mg | ORAL | Qty: 1

## 2011-08-16 MED FILL — CEFTRIAXONE 2 GRAM SOLUTION FOR INJECTION: 2 gram | INTRAMUSCULAR | Qty: 2

## 2011-08-16 MED FILL — CARVEDILOL 6.25 MG TAB: 6.25 mg | ORAL | Qty: 1

## 2011-08-16 MED FILL — LISINOPRIL 20 MG TAB: 20 mg | ORAL | Qty: 2

## 2011-08-16 MED FILL — BD POSIFLUSH NORMAL SALINE 0.9 % INJECTION SYRINGE: INTRAMUSCULAR | Qty: 10

## 2011-08-16 MED FILL — LOVENOX 40 MG/0.4 ML SUBCUTANEOUS SYRINGE: 40 mg/0.4 mL | SUBCUTANEOUS | Qty: 0.4

## 2011-08-16 NOTE — Progress Notes (Signed)
Hospitalist Consult Progress Note          Norman Clay, NP  Romilda Joy [phone]  Call physician on-call through the operator 7pm-7am    Daily Progress Note: 08/16/2011  Assessment/Plan:   1. L infected diabetic foot: s/p I&D left foot with 5th metatarsal head resection 6/27. Attending: Dr. Manson Passey. Weight bearing/Physical therapy as per ortho. ABIs are normal. CT scan indicating osteomyelitis. ID following, antibiotics changed to rocephin, will need long term per ID. PICC ordered.  2. DM type 2: HgbA1c: 5.8, blood glucose well controlled. Pt will not need amaryl on discharge. Continue with SSI as needed.  3. AKI: Resolved/Stable.  4. Hypotension: Resolved. BP stable now, monitor.  5. Hx CAD: Cardiology has seen this admit. ASA, coreg and continue statin.  6. Hx chronic systolic heart failure: No signs of decompensation. Appreciate cardiology input. Echo 6/24 EF 40%. ACEi, coreg and ASA.  7. Hx peripheral neuropathy: Gabapentin TID. Lyrica. Pain free today.     DVT Prophylaxis: as per Ortho  Care Plan/Dispo: Needs long term abx. Case management consult.    Hospitalists will continue to follow up as needed.     Subjective:   Pt lying in bed, verbalizes no complaints. Says he needs somewhere to go. Discussed PICC line and SNF for long term antibiotics. He is agreeable.    Review of Systems:   Denies HA. No chest pain or pressure. No abdominal complaints.     Objective:   Physical Exam:   BP 155/90   Pulse 68   Temp 98.6 ??F (37 ??C)   Resp 20   Ht 6' (1.829 m)   Wt 193 lb 5.5 oz   BMI 26.22 kg/m2   SpO2 96% O2 Flow Rate (L/min): 2 l/min O2 Device: Room air  Temp (24hrs), Avg:98.2 ??F (36.8 ??C), Min:97.7 ??F (36.5 ??C), Max:98.6 ??F (37 ??C)        06/29 1900 - 07/01 0659  In: 250 [I.V.:250]  Out: -   General:  Alert, cooperative, no acute distress.   HEENT: NC. Moist oral mucosa.   Lungs:   Clear to auscultation bilaterally.   Heart:  Regular rate.   Abdomen:   Soft, non-tender. Non distended. +  bowel sounds.    Extremities: Left foot dressing clean dry and intact. Palpable pulses to LE.   Skin: Skin color, texture, turgor nl.    Neurologic: A/O x 3 with no focal deficits. Speech Clear.    Psych: Cooperative. Not anxious or agitated.     Data Review:   24 Hour Results:  Recent Results (from the past 24 hour(s))   GLUCOSE, POC    Collection Time    08/15/11 11:16 AM       Component Value Range    POC GLUCOSE 99  75 - 110 mg/dL    Performed by Tillman Sers     GLUCOSE, POC    Collection Time    08/15/11  4:40 PM       Component Value Range    POC GLUCOSE 100  75 - 110 mg/dL    Performed by Tillman Sers     GLUCOSE, POC    Collection Time    08/15/11  9:12 PM       Component Value Range    POC GLUCOSE 111 (*) 75 - 110 mg/dL    Performed by Tana Felts     METABOLIC PANEL, BASIC    Collection Time    08/16/11  2:42 AM       Component Value Range    Sodium 139  136 - 145 MMOL/L    Potassium 3.9  3.5 - 5.1 MMOL/L    Chloride 109 (*) 97 - 108 MMOL/L    CO2 24  21 - 32 MMOL/L    Anion gap 6  5 - 15 mmol/L    Glucose 82  65 - 100 MG/DL    BUN 6  6 - 20 MG/DL    Creatinine 1.61  0.96 - 1.15 MG/DL    BUN/Creatinine ratio 6 (*) 12 - 20      GFR est-AA >60  >60 ml/min/1.52m2    GFR est non-AA >60  >60 ml/min/1.77m2    Calcium 8.8  8.5 - 10.1 MG/DL   GLUCOSE, POC    Collection Time    08/16/11  6:18 AM       Component Value Range    POC GLUCOSE 86  75 - 110 mg/dL    Performed by Tana Felts       Medications reviewed as per [facility]  Total time spent with patient: 30 minutes.    [place]. Erma Heritage, NP

## 2011-08-16 NOTE — Progress Notes (Signed)
Bedside shift change report given to S. Pritchett, LPN (oncoming nurse) by T. Brandon, RN (offgoing nurse).  Report given with SBAR.

## 2011-08-16 NOTE — Progress Notes (Signed)
Pt got PICC today  Left foot dressing changed  Some drainage on dressing   But incision c/d/i    May be discharged from my standpoint  abx per ID  Will see in office 2 weeks for wound check  Suture removal

## 2011-08-16 NOTE — Progress Notes (Signed)
Michael Lozano is a 77 y.o. with a history of CAD, , DM, HTN   Admitted with lt plantar wound which has been present for many months-   Had amputation of the metatarsal head of the 5 th toe and debridement of the wound   He is feeling better   :   Objective:    VITALS: 97.7 ??F (36.5 ??C) 60 141/83 mmHg   PHYSICAL EXAM:   General: Alert, , no distress, appears stated age.   Lungs: Clear to auscultation bilaterally.   Heart: Regular rate and rhythm,   Abdomen: Soft, non-tender,not distended. Bowel sounds normal. No masses   Extremities:lt foot- less swollen and erythematous- surgical site- clean with minimal dischargeSkin: No rashes or lesions. Not Jaundiced   Neurologic: Grossly non-focal   Pertinent Labs   Wbc 12.4 -now 5.0   Cr 1.07   Micro- wound culture GBS   IMAGING RESULTS:   Plantar ulcer overlying fifth metatarsal head with osteomyelitis   of the metatarsal head. Dorsal dislocation of fifth metatarsal phalangeal   joint.    Impression/Recommendation   77 yr male with h/o dm presents with chronic foot ulcer   1) Diabetic foot ulcer with osteomyelitis of the 5th metatarsal   Underwent debridement and resection of the 5th metarsal head-group B strep from non surgical culture   Surgical culture only coag neg staph in thio broth- a contaminant and not a true pathogen   Day 8 of antibiotic --now On  Rocephin -  HPE- acute osteomyelitis-- will need IV for another 3 weeks followed by PO for another 2 weeks   2) DM -management as per primary team

## 2011-08-16 NOTE — Progress Notes (Signed)
Bedside and Verbal shift change report given to heather king rn covering spring pritchett lpn (oncoming nurse) by jacquelyn wolowic rn (offgoing nurse).  Report given with SBAR, Kardex, OR Summary, Intake/Output and MAR.

## 2011-08-16 NOTE — Progress Notes (Signed)
Patient seen and examined by me. I have reviewed and agree with assessment and plan outlined above by H. Isaacs NP.  Disposition per primary team

## 2011-08-16 NOTE — Progress Notes (Signed)
Bedside shift change report given to A.Chritensen RN (Cabin crew) by S. Cleotha Whalin LPN / Wyvonnia Dusky RN (offgoing nurse).  Report given with SBAR.

## 2011-08-16 NOTE — Procedures (Incomplete)
PICC Placement Note : Order received and consent obtained from patient after explaining the reason for  PICC placement, the procedure,risks,benefits and potential complications. An opportunity was provided to ask questions and relay concerns. 4 fr Power Solo PICC was placed using sterile technique and max barrier precautions at patients bedside. Modified Seldinger Technique with ultrasound guidance used to locate the vein. Vein accessed with 21 G Introducer Safety Needle and guidewire easily thread. Sherlock PICC tip locator device used to determine PICC tip direction.    PRE-PROCEDURE VERIFICATION  Correct Procedure: yes  Correct Site:  yes  Temperature: Temp: 98.6 ??F (37 ??C), Temperature Source: Temp Source: Oral  Recent Labs   Basename 08/16/11 0242 08/14/11 0414    BUN 6 --    CREA 1.03 --    PLT -- 292    INR -- --    WBC -- 5.0     Allergies: Review of patient's allergies indicates no known allergies.  Education materials, including PICC Booklet, for PICC Care given to patient: yes.   See Patient Education activity for further details.    PROCEDURE DETAIL  A single lumen PICC line was started for Home IV Therapy. The following documentation is in addition to the PICC properties in the lines/airways flowsheet :  Lot #: [id number]  Was xylocaine 1% used intradermally:  yes 1 ml used.  Catheter Length: 39 (cm)  Vein Selection for PICC:right basilic  Central Line Bundle followed yes  Complication Related to Insertion: none    The placement was verified by X-ray: yes. The x-ray results state the tip location is on the right side and the tip is in the  superior vena cava and read by Herschel Senegal MD    Line is okay to use: yes    Vonda Antigua, RN

## 2011-08-16 NOTE — Progress Notes (Signed)
Reviewed plan of care with spring prichett lpn.

## 2011-08-17 LAB — GLUCOSE, POC
Glucose (POC): 98 mg/dL (ref 75–110)
Glucose (POC): 97 mg/dL (ref 75–110)
Glucose (POC): 142 mg/dL — ABNORMAL HIGH (ref 75–110)
Glucose (POC): 118 mg/dL — ABNORMAL HIGH (ref 75–110)

## 2011-08-17 LAB — SED RATE (ESR): Sed rate (ESR): 90 MM/HR — ABNORMAL HIGH (ref 0–20)

## 2011-08-17 LAB — C REACTIVE PROTEIN, QT: C-Reactive protein: 0.72 mg/dL — ABNORMAL HIGH (ref 0.00–0.60)

## 2011-08-17 MED ADMIN — cefTRIAXone (ROCEPHIN) 2 g in 0.9% sodium chloride (MBP/ADV) 50 mL MBP: INTRAVENOUS | @ 11:00:00 | NDC 00409733503

## 2011-08-17 MED ADMIN — HYDROcodone-acetaminophen (NORCO) 10-325 mg tablet 1 Tab: ORAL | @ 14:00:00 | NDC 68084035311

## 2011-08-17 MED ADMIN — carvedilol (COREG) tablet 6.25 mg: ORAL | @ 14:00:00 | NDC 68084026211

## 2011-08-17 MED ADMIN — sodium chloride (NS) flush 5-10 mL: INTRAVENOUS | @ 11:00:00 | NDC 87701099893

## 2011-08-17 MED ADMIN — sodium chloride (NS) flush 10 mL: @ 02:00:00 | NDC 87701099893

## 2011-08-17 MED ADMIN — gabapentin (NEURONTIN) capsule 300 mg: ORAL | @ 02:00:00 | NDC 68084056311

## 2011-08-17 MED ADMIN — sodium chloride (NS) flush 10 mL: @ 17:00:00 | NDC 82903065462

## 2011-08-17 MED ADMIN — pregabalin (LYRICA) capsule 25 mg: ORAL | @ 14:00:00 | NDC 00071101268

## 2011-08-17 MED ADMIN — lisinopril (PRINIVIL, ZESTRIL) tablet 40 mg: ORAL | @ 14:00:00 | NDC 68084006211

## 2011-08-17 MED ADMIN — sodium chloride (NS) flush 5-10 mL: INTRAVENOUS | @ 18:00:00 | NDC 87701099893

## 2011-08-17 MED ADMIN — carvedilol (COREG) tablet 6.25 mg: ORAL | @ 21:00:00 | NDC 68084026211

## 2011-08-17 MED ADMIN — sodium chloride (NS) flush 10 mL: @ 13:00:00 | NDC 87701099893

## 2011-08-17 MED ADMIN — gabapentin (NEURONTIN) capsule 300 mg: ORAL | @ 20:00:00 | NDC 68084056311

## 2011-08-17 MED ADMIN — enoxaparin (LOVENOX) injection 40 mg: SUBCUTANEOUS | @ 02:00:00 | NDC 00075062040

## 2011-08-17 MED ADMIN — atorvastatin (LIPITOR) tablet 20 mg: ORAL | @ 02:00:00 | NDC 68084056511

## 2011-08-17 MED ADMIN — gabapentin (NEURONTIN) capsule 300 mg: ORAL | @ 14:00:00 | NDC 68084056311

## 2011-08-17 MED ADMIN — insulin lispro (HUMALOG) injection: SUBCUTANEOUS | @ 17:00:00 | NDC 00002751017

## 2011-08-17 MED ADMIN — senna-docusate (PERICOLACE) 8.6-50 mg per tablet 1 Tab: ORAL | @ 14:00:00 | NDC 00904564361

## 2011-08-17 MED ADMIN — aspirin chewable tablet 81 mg: ORAL | @ 14:00:00 | NDC 63739043401

## 2011-08-17 MED FILL — GABAPENTIN 300 MG CAP: 300 mg | ORAL | Qty: 1

## 2011-08-17 MED FILL — CEFTRIAXONE 2 GRAM SOLUTION FOR INJECTION: 2 gram | INTRAMUSCULAR | Qty: 2

## 2011-08-17 MED FILL — INSULIN LISPRO 100 UNIT/ML INJECTION: 100 unit/mL | SUBCUTANEOUS | Qty: 1

## 2011-08-17 MED FILL — LISINOPRIL 20 MG TAB: 20 mg | ORAL | Qty: 2

## 2011-08-17 MED FILL — CARVEDILOL 6.25 MG TAB: 6.25 mg | ORAL | Qty: 1

## 2011-08-17 MED FILL — BD POSIFLUSH NORMAL SALINE 0.9 % INJECTION SYRINGE: INTRAMUSCULAR | Qty: 10

## 2011-08-17 MED FILL — BAYER CHILDRENS ASPIRIN 81 MG CHEWABLE TABLET: 81 mg | ORAL | Qty: 1

## 2011-08-17 MED FILL — LOVENOX 40 MG/0.4 ML SUBCUTANEOUS SYRINGE: 40 mg/0.4 mL | SUBCUTANEOUS | Qty: 0.4

## 2011-08-17 MED FILL — LIPITOR 20 MG TABLET: 20 mg | ORAL | Qty: 1

## 2011-08-17 MED FILL — PANTOPRAZOLE 40 MG TAB, DELAYED RELEASE: 40 mg | ORAL | Qty: 1

## 2011-08-17 MED FILL — LYRICA 25 MG CAPSULE: 25 mg | ORAL | Qty: 1

## 2011-08-17 MED FILL — BD POSIFLUSH NORMAL SALINE 0.9 % INJECTION SYRINGE: INTRAMUSCULAR | Qty: 30

## 2011-08-17 MED FILL — SENNA PLUS 8.6 MG-50 MG TABLET: ORAL | Qty: 1

## 2011-08-17 MED FILL — HYDROCODONE-ACETAMINOPHEN 10 MG-325 MG TAB: 10-325 mg | ORAL | Qty: 1

## 2011-08-17 NOTE — Progress Notes (Signed)
Bedside and Verbal shift change report given to Sha ,RN (oncoming nurse) by Sandy Roney, RN (offgoing nurse).  Report given with SBAR and Kardex.

## 2011-08-17 NOTE — Progress Notes (Signed)
..  Bedside and Verbal shift change report given to Spring (oncoming nurse) by Angie (offgoing nurse).  Report given with SBAR.

## 2011-08-17 NOTE — Progress Notes (Signed)
Physical Therapy Screening:    An InBasket screening referral was triggered for physical therapy based on results obtained during the nursing admission assessment.  The patient???s chart was reviewed and the patient is appropriate for a skilled therapy evaluation if there is a decline in functional mobility from baseline.  Please order a consult for physical therapy if you are in agreement and would like an evaluation to be completed.  Thank you.    Katherine L Hambrick, PT

## 2011-08-17 NOTE — Progress Notes (Signed)
Michael Lozano is a 77 y.o. with a history of CAD, , DM, HTN   Admitted with lt plantar wound which has been present for many months-   Had amputation of the metatarsal head of the 5 th toe and debridement of the wound   He is feeling better   :   Objective:    VITALS: 98.3 ??F (36.8 ??C) ! 59 137/79     PHYSICAL EXAM:   General: Alert, , no distress, appears stated age.   Lungs: Clear to auscultation bilaterally.   Heart: Regular rate and rhythm,   Abdomen: Soft, non-tender,not distended. Bowel sounds normal. No masses   Extremities:lt foot- less swollen and erythematous- surgical site- clean with minimal dischargeSkin: No rashes or lesions. Not Jaundiced   Neurologic: Grossly non-focal   Pertinent Labs   Wbc 12.4 -now 5.0   Cr 1.07   Micro- wound culture GBS   IMAGING RESULTS:   Plantar ulcer overlying fifth metatarsal head with osteomyelitis   of the metatarsal head. Dorsal dislocation of fifth metatarsal phalangeal joint.  HPE- acute osteomyelitis    Impression/Recommendation   77 yr male with h/o dm presents with chronic foot ulcer   1) Diabetic foot ulcer with osteomyelitis of the 5th metatarsal   Underwent debridement and resection of the 5th metarsal head-  group B strep from non surgical culture   Surgical culture only coag neg staph in thio broth- a contaminant and not a true pathogen   Day 9 of antibiotic --now On Rocephin - HPE- acute osteomyelitis-- will need IV for another 2 weeks followed by PO for another 2 weeks   2) DM -management as per primary team

## 2011-08-17 NOTE — Progress Notes (Signed)
Bedside and Verbal shift change report given to sandy r. rn (oncoming nurse) by Roe Coombs wolowic rn (offgoing nurse).  Report given with SBAR, Kardex, OR Summary, Intake/Output and MAR.

## 2011-08-17 NOTE — Progress Notes (Signed)
Awaiting rehab placement  May go when bed available

## 2011-08-17 NOTE — Progress Notes (Signed)
Reviewed plan of care with spring pritchett lpn.

## 2011-08-17 NOTE — Progress Notes (Signed)
NUTRITION      [x]              Rescreen           RECOMMENDATIONS:   Continue with current diet as tolerated     SUBJECTIVE/OBJECTIVE:   Patient states "I need more food".  Denies nausea/vomiting or difficulty/chewing.      Diet:  Diabetic Regular     Intake: [x]            Good     []            Fair      []            Poor   Patient Vitals for the past 100 hrs:   % Diet Eaten   08/16/11 0900 75 %     Weight Changes:   []             Loss  []             Gain  [x]             Stable    Nutrition Problems Identified  [x]       None    PLAN:     [x]             Rescreen per screening protocol    Rescreen:  []             At Nutrition Risk           [x]             Not at Nutrition Risk, rescreen per screening protocol      Georgiana Shore, RD

## 2011-08-17 NOTE — Progress Notes (Signed)
OPAT  1) care of PICC line  2) Ceftriaxone (Rocephin )2 grams IV once a week for 2 weeks- may need more depending on CRP  3)CBC/CHEM 20/CRP  once a week  4) call Dr.Kalei Mckillop with any questions [phone]  5) Fax results to [phone]  6) Inform Dr.Cleo Santucci before stopping IV antibiotics as he may have to go on PO antibiotics following IV.

## 2011-08-18 LAB — GLUCOSE, POC
Glucose (POC): 102 mg/dL (ref 75–110)
Glucose (POC): 96 mg/dL (ref 75–110)
Glucose (POC): 95 mg/dL (ref 75–110)

## 2011-08-18 LAB — METABOLIC PANEL, BASIC
Anion gap: 8 mmol/L (ref 5–15)
BUN/Creatinine ratio: 14 (ref 12–20)
BUN: 14 MG/DL (ref 6–20)
CO2: 25 MMOL/L (ref 21–32)
Calcium: 8.7 MG/DL (ref 8.5–10.1)
Chloride: 106 MMOL/L (ref 97–108)
Creatinine: 1.03 MG/DL (ref 0.45–1.15)
GFR est AA: >60 mL/min/{1.73_m2} (ref >60)
GFR est non-AA: >60 mL/min/{1.73_m2} (ref >60)
Glucose: 88 MG/DL (ref 65–100)
Potassium: 4.6 MMOL/L (ref 3.5–5.1)
Sodium: 139 MMOL/L (ref 136–145)

## 2011-08-18 MED ADMIN — lisinopril (PRINIVIL, ZESTRIL) tablet 40 mg: ORAL | @ 14:00:00 | NDC 68084006211

## 2011-08-18 MED ADMIN — sodium chloride (NS) flush 5-10 mL: INTRAVENOUS | @ 10:00:00 | NDC 87701099893

## 2011-08-18 MED ADMIN — aspirin chewable tablet 81 mg: ORAL | @ 14:00:00 | NDC 63739043401

## 2011-08-18 MED ADMIN — gabapentin (NEURONTIN) capsule 300 mg: ORAL | @ 03:00:00 | NDC 68084056311

## 2011-08-18 MED ADMIN — senna-docusate (PERICOLACE) 8.6-50 mg per tablet 1 Tab: ORAL | @ 14:00:00 | NDC 00904564361

## 2011-08-18 MED ADMIN — enoxaparin (LOVENOX) injection 40 mg: SUBCUTANEOUS | @ 03:00:00 | NDC 00075062040

## 2011-08-18 MED ADMIN — gabapentin (NEURONTIN) capsule 300 mg: ORAL | @ 14:00:00 | NDC 68084056311

## 2011-08-18 MED ADMIN — pneumococcal 23-valent (PNEUMOVAX 23) injection 0.5 mL: INTRAMUSCULAR | @ 16:00:00 | NDC 00006494300

## 2011-08-18 MED ADMIN — sodium chloride (NS) flush 5-10 mL: INTRAVENOUS | @ 02:00:00 | NDC 82903065462

## 2011-08-18 MED ADMIN — pregabalin (LYRICA) capsule 25 mg: ORAL | @ 14:00:00 | NDC 00071101268

## 2011-08-18 MED ADMIN — carvedilol (COREG) tablet 6.25 mg: ORAL | @ 14:00:00 | NDC 68084026211

## 2011-08-18 MED ADMIN — sodium chloride (NS) flush 20 mL: @ 07:00:00 | NDC 82903065462

## 2011-08-18 MED ADMIN — HYDROcodone-acetaminophen (NORCO) 10-325 mg tablet 1 Tab: ORAL | @ 14:00:00 | NDC 68084035311

## 2011-08-18 MED ADMIN — sodium chloride (NS) flush 10 mL: @ 03:00:00 | NDC 87701099893

## 2011-08-18 MED ADMIN — cefTRIAXone (ROCEPHIN) 2 g in 0.9% sodium chloride (MBP/ADV) 50 mL MBP: INTRAVENOUS | @ 11:00:00 | NDC 00409733503

## 2011-08-18 MED ADMIN — atorvastatin (LIPITOR) tablet 20 mg: ORAL | @ 03:00:00 | NDC 68084056511

## 2011-08-18 MED FILL — LISINOPRIL 20 MG TAB: 20 mg | ORAL | Qty: 2

## 2011-08-18 MED FILL — HYDROCODONE-ACETAMINOPHEN 10 MG-325 MG TAB: 10-325 mg | ORAL | Qty: 1

## 2011-08-18 MED FILL — PNEUMOVAX-23 25 MCG/0.5 ML INJECTION SOLUTION: 25 mcg/0.5 mL | INTRAMUSCULAR | Qty: 0.5

## 2011-08-18 MED FILL — PANTOPRAZOLE 40 MG TAB, DELAYED RELEASE: 40 mg | ORAL | Qty: 1

## 2011-08-18 MED FILL — SENNA PLUS 8.6 MG-50 MG TABLET: ORAL | Qty: 1

## 2011-08-18 MED FILL — CEFTRIAXONE 2 GRAM SOLUTION FOR INJECTION: 2 gram | INTRAMUSCULAR | Qty: 2

## 2011-08-18 MED FILL — BAYER CHILDRENS ASPIRIN 81 MG CHEWABLE TABLET: 81 mg | ORAL | Qty: 1

## 2011-08-18 MED FILL — LIPITOR 20 MG TABLET: 20 mg | ORAL | Qty: 1

## 2011-08-18 MED FILL — LOVENOX 40 MG/0.4 ML SUBCUTANEOUS SYRINGE: 40 mg/0.4 mL | SUBCUTANEOUS | Qty: 0.4

## 2011-08-18 MED FILL — CARVEDILOL 6.25 MG TAB: 6.25 mg | ORAL | Qty: 1

## 2011-08-18 MED FILL — LYRICA 25 MG CAPSULE: 25 mg | ORAL | Qty: 1

## 2011-08-18 MED FILL — GABAPENTIN 300 MG CAP: 300 mg | ORAL | Qty: 1

## 2011-08-18 MED FILL — BD POSIFLUSH NORMAL SALINE 0.9 % INJECTION SYRINGE: INTRAMUSCULAR | Qty: 20

## 2011-08-18 NOTE — Progress Notes (Signed)
Discharged to [facility] via [facility] copy of discharge instruction mar kardex and prescription patient verbalize understanding all personal belonging taken by patient

## 2011-08-18 NOTE — Progress Notes (Addendum)
Location: [id number] - [id number]  Attn.: Michael Lozano  DOB: [birth year] / Age: 77  MR#: [id number] / Admit#: [id number]  Pt. First Name: Michael  Pt. Last Name: Lozano  [facility]  [address]  [place], [place]  [address]                        Case Management - Progress Note  Initial Open Date: 08/09/2011  Case Manager: Michael Lozano    Initial Open Date: 08/10/2011  Social Worker: Michael Lozano  Expected Date of Discharge:  Transferred From: home  ECF Bed Held Until:  Bed Held By:  Power of Attorney: Michael Lozano [phone];  relative-Michael Lozano-[phone]  POA/Guardian/Conservator Capacity:  Primary Caregiver:  Living Arrangements: Own Home  Source of Income:  Payee:  Psychosocial History:  Cultural/Religious/Language Issues:  Education Level:  ADLS/Current Living Arrangements Issues: self care pta  Past Providers:  Will patient perform self care at discharge? Y  Anticipated Discharge Disposition Goal: Home with Home Health Care  Assessment/Plan:   08/18/2011 09:50A The patient will DC to [facility] today  via [facility] at 12pm, no preference for ambulance company. Kardex,  mars, dc instructions, and ambulance certification will follow. CRM will  send wound note with the dc instructions via [facility]. Michael Lozano, CRM.          08/17/2011 03:40P Michael Lozano from [facility] called and they are planning on  taking this patient tomorrow. The insurance company is asking for  documentation regarding the size of the wound and IV orders. CRM sent the  IV orders via [facility] and spoke with Michael Lozano about getting a note regarding  the size of the wound and to clarify the wound care. Will follow. Michael  Loys Lozano, CRM.          08/17/2011 10:39A Michael Lozano has started the insurance AUTH for admission. CRM  will follow for the AUTH and IV abx orders. Michael Lozano, CRM.          08/16/2011 03:12P Cm received a consult to assist pt with nursing home  placement, as he will need IV antibx. CM  reviewed pt's chart and met with  him at his  bedside. Skilled nursing facilities discussed. Pt stated that  he would like for his information to go to [facility]. CM  sent a referral to [facility] through [employer]. Michael Lozano, Michael Lozano             08/10/2011 10:34A Chart reviewed. CRM will follow for dc planning post  debridement. Michael Lozano, CRM.  Resources at Discharge:  Service Providers at Discharge:  Dictating Provider:  Leana Lozano

## 2011-08-18 NOTE — Progress Notes (Signed)
.  Bedside shift change report given to S. Pritchett, LPN (oncoming nurse) by T. Brandon, RN (offgoing nurse).  Report given with SBAR.

## 2011-08-24 NOTE — Discharge Summary (Signed)
Name:       Michael Lozano, Michael Lozano                Admitted:    08/09/2011                                               Discharged:  08/18/2011  Account #:  [id number]                     DOB:         1934-11-14  Consultant: Daymon Larsen, MD          Age          77                                 DISCHARGE SUMMARY      DIAGNOSES  1. Diabetes.  2. Hypertension.  3. Neuropathy.  4. Coronary artery disease.  5. Heart failure.  6. Left diabetic foot infection.  7. Left 5th metatarsal head osteomyelitis.    PROCEDURE: I D, left foot, with 5th metatarsal head resection on August 12, 2011.    CONSULTATIONS  1. The Hospitalist Service for medical management.  2. Vascular Surgery, Dr. Sharlot Gowda L. Cathren Harsh, for evaluation of circulation.  3. Cardiology, per the request of the Hospitalist Service for preoperative  clearance.  4. Infectious Disease, Dr. Noralee Space, for antibiotic recommendations.    HISTORY: The patient is a 77 year old male who is somewhat of a poor  historian, who was referred to me by his primary care physician, Dr. Zena Amos, for evaluation of a nonhealing left plantar foot wound. The  patient has multiple medical problems, including diabetes, heart disease,  hypertension, neuropathy. He had been treated for this as an outpatient,  and really continued to have problems and could not get it to heal, so we  admitted him to the hospitalists, and due to his multiple medical problems,  and being somewhat of a poor historian, a Hospitalist consultation was  obtained, who then recommended a Cardiology consultation, and he was found  to have chronic heart failure, but appropriate for surgery. A Vascular  Surgery consultation was obtained, as the patient had no palpable pulses.  He had AVI studies, which revealed that he had adequate circulation for a  foot surgery. He had a CT scan, as he was unable to have an MRI, and the CT  scan showed that he had osteomyelitis of the 5th metatarsal head, which was   directly over this nonhealing wound, as well as a dislocated 5th MP joint.  I discussed options with the patient.  He ultimately wished to go forward  with the 5th metatarsal head resection, but leave his 5th toe.    He went to the operating room on June 27th and underwent an I D with 5th  metatarsal head resection. His 5th MP joint was found to be dislocated at  the time of surgery. Once his metatarsal head was removed and this area was  irrigated and debrided, then it was closed and his 5th toe was left intact.  Cultures were obtained. Infectious Disease was consulted. He was started on  appropriate antibiotics.  He eventually obtained a PICC line, and due to  mostly social history issues and  a lack of having anywhere else to go, he  ended up being discharged to a rehabilitation facility with the PICC line  and orders for IV antibiotics, to followup with both myself and Dr. Noralee Space from Infectious Disease.                      Daymon Larsen, MD    cc:    Daymon Larsen, MD      MHB/wmx; D: 08/23/2011 11:58 A; T: 08/24/2011 07:30 A; DOC# [id number]; Job#  [id number]

## 2011-09-04 MED FILL — LACTATED RINGERS IV: INTRAVENOUS | Qty: 1000

## 2011-09-04 MED FILL — AMIDATE 2 MG/ML INTRAVENOUS SOLUTION: 2 mg/mL | INTRAVENOUS | Qty: 20

## 2011-09-04 MED FILL — ROCURONIUM 10 MG/ML IV: 10 mg/mL | INTRAVENOUS | Qty: 5

## 2011-09-04 MED FILL — BACITRACIN 50,000 UNIT IM: 50000 unit | INTRAMUSCULAR | Qty: 50000

## 2011-09-04 MED FILL — QUELICIN 20 MG/ML INJECTION SOLUTION: 20 mg/mL | INTRAMUSCULAR | Qty: 10

## 2011-09-04 MED FILL — LIDOCAINE HCL 2 % (20 MG/ML) IJ SOLN: 20 mg/mL (2 %) | INTRAMUSCULAR | Qty: 20

## 2012-02-22 ENCOUNTER — Emergency Department: Payer: Self-pay | Admitting: Emergency Medicine

## 2012-02-23 LAB — CBC
HCT: 43.5 % (ref 40.0–52.0)
HGB: 15 g/dL (ref 13.0–18.0)
MCHC: 34.6 g/dL (ref 32.0–36.0)
RDW: 14.7 % — ABNORMAL HIGH (ref 11.5–14.5)

## 2012-02-23 LAB — COMPREHENSIVE METABOLIC PANEL
Anion Gap: 11 (ref 7–16)
BUN: 15 mg/dL (ref 7–18)
Bilirubin,Total: 0.4 mg/dL (ref 0.2–1.0)
Co2: 20 mmol/L — ABNORMAL LOW (ref 21–32)
EGFR (African American): >60
Sodium: 137 mmol/L (ref 136–145)
Total Protein: 9.3 g/dL — ABNORMAL HIGH (ref 6.4–8.2)

## 2012-08-15 ENCOUNTER — Ambulatory Visit: Payer: Self-pay

## 2012-08-15 DIAGNOSIS — I2699 Other pulmonary embolism without acute cor pulmonale: Secondary | ICD-10-CM | POA: Insufficient documentation

## 2012-09-27 DIAGNOSIS — R634 Abnormal weight loss: Secondary | ICD-10-CM | POA: Insufficient documentation

## 2012-09-27 DIAGNOSIS — N138 Other obstructive and reflux uropathy: Secondary | ICD-10-CM | POA: Insufficient documentation

## 2012-09-27 DIAGNOSIS — N401 Enlarged prostate with lower urinary tract symptoms: Secondary | ICD-10-CM | POA: Insufficient documentation

## 2012-09-27 DIAGNOSIS — R972 Elevated prostate specific antigen [PSA]: Secondary | ICD-10-CM | POA: Insufficient documentation

## 2013-04-05 ENCOUNTER — Emergency Department: Payer: Self-pay | Admitting: Emergency Medicine

## 2013-04-05 LAB — CBC WITH DIFFERENTIAL/PLATELET
BASOS PCT: 0.9 %
Basophil #: 0.1 10*3/uL (ref 0.0–0.1)
EOS PCT: 3 %
Eosinophil #: 0.2 10*3/uL (ref 0.0–0.7)
HCT: 42.5 % (ref 40.0–52.0)
HGB: 14.2 g/dL (ref 13.0–18.0)
LYMPHS ABS: 1.9 10*3/uL (ref 1.0–3.6)
Lymphocyte %: 24.7 %
MCH: 31.3 pg (ref 26.0–34.0)
MCHC: 33.3 g/dL (ref 32.0–36.0)
MCV: 94 fL (ref 80–100)
MONO ABS: 1 x10 3/mm (ref 0.2–1.0)
Monocyte %: 13.4 %
Neutrophil #: 4.5 10*3/uL (ref 1.4–6.5)
Neutrophil %: 58 %
PLATELETS: 244 10*3/uL (ref 150–440)
RBC: 4.52 10*6/uL (ref 4.40–5.90)
RDW: 13.3 % (ref 11.5–14.5)
WBC: 7.7 10*3/uL (ref 3.8–10.6)

## 2013-04-05 LAB — COMPREHENSIVE METABOLIC PANEL
ALT: 22 U/L (ref 12–78)
ANION GAP: 7 (ref 7–16)
AST: 26 U/L (ref 15–37)
Albumin: 3.7 g/dL (ref 3.4–5.0)
Alkaline Phosphatase: 123 U/L — ABNORMAL HIGH
BILIRUBIN TOTAL: 0.5 mg/dL (ref 0.2–1.0)
BUN: 10 mg/dL (ref 7–18)
CALCIUM: 9.5 mg/dL (ref 8.5–10.1)
CHLORIDE: 104 mmol/L (ref 98–107)
Co2: 24 mmol/L (ref 21–32)
Creatinine: 1.19 mg/dL (ref 0.60–1.30)
EGFR (African American): >60
EGFR (Non-African Amer.): 59 — ABNORMAL LOW
Glucose: 97 mg/dL (ref 65–99)
OSMOLALITY: 269 (ref 275–301)
Potassium: 3.7 mmol/L (ref 3.5–5.1)
Sodium: 135 mmol/L — ABNORMAL LOW (ref 136–145)
Total Protein: 8.6 g/dL — ABNORMAL HIGH (ref 6.4–8.2)

## 2013-04-05 LAB — TROPONIN I

## 2013-07-18 DIAGNOSIS — K922 Gastrointestinal hemorrhage, unspecified: Secondary | ICD-10-CM | POA: Insufficient documentation

## 2013-08-10 DIAGNOSIS — Z950 Presence of cardiac pacemaker: Secondary | ICD-10-CM | POA: Insufficient documentation

## 2013-08-10 DIAGNOSIS — Z951 Presence of aortocoronary bypass graft: Secondary | ICD-10-CM | POA: Insufficient documentation

## 2013-09-08 ENCOUNTER — Emergency Department: Payer: Self-pay | Admitting: Emergency Medicine

## 2013-09-08 LAB — BASIC METABOLIC PANEL
ANION GAP: 8 (ref 7–16)
BUN: 11 mg/dL (ref 7–18)
CHLORIDE: 106 mmol/L (ref 98–107)
CO2: 24 mmol/L (ref 21–32)
Calcium, Total: 9.2 mg/dL (ref 8.5–10.1)
Creatinine: 1.48 mg/dL — ABNORMAL HIGH (ref 0.60–1.30)
EGFR (African American): 52 — ABNORMAL LOW
GFR CALC NON AF AMER: 45 — AB
GLUCOSE: 148 mg/dL — AB (ref 65–99)
OSMOLALITY: 278 (ref 275–301)
Potassium: 3.8 mmol/L (ref 3.5–5.1)
Sodium: 138 mmol/L (ref 136–145)

## 2013-09-08 LAB — CBC
HCT: 38.9 % — AB (ref 40.0–52.0)
HGB: 12.9 g/dL — ABNORMAL LOW (ref 13.0–18.0)
MCH: 31.7 pg (ref 26.0–34.0)
MCHC: 33.2 g/dL (ref 32.0–36.0)
MCV: 96 fL (ref 80–100)
PLATELETS: 208 10*3/uL (ref 150–440)
RBC: 4.07 10*6/uL — AB (ref 4.40–5.90)
RDW: 14.1 % (ref 11.5–14.5)
WBC: 10.6 10*3/uL (ref 3.8–10.6)

## 2013-09-08 LAB — TROPONIN I: Troponin-I: <0.02 ng/mL

## 2014-03-27 DIAGNOSIS — E118 Type 2 diabetes mellitus with unspecified complications: Secondary | ICD-10-CM | POA: Insufficient documentation

## 2014-04-04 ENCOUNTER — Emergency Department: Payer: Self-pay | Admitting: Internal Medicine

## 2014-05-22 ENCOUNTER — Emergency Department: Admit: 2014-05-22 | Disposition: A | Discharge status: Home / Self Care | Payer: Self-pay | Admitting: Student

## 2014-10-18 ENCOUNTER — Encounter: Payer: Self-pay | Admitting: Family Medicine

## 2014-10-18 ENCOUNTER — Ambulatory Visit (INDEPENDENT_AMBULATORY_CARE_PROVIDER_SITE_OTHER): Payer: Medicare PPO | Admitting: Family Medicine

## 2014-10-18 VITALS — BP 117/64 | HR 99 | Temp 97.6°F | Ht 69.0 in | Wt 212.0 lb

## 2014-10-18 DIAGNOSIS — K429 Umbilical hernia without obstruction or gangrene: Secondary | ICD-10-CM

## 2014-10-18 DIAGNOSIS — E538 Deficiency of other specified B group vitamins: Secondary | ICD-10-CM | POA: Diagnosis not present

## 2014-10-18 DIAGNOSIS — Z86711 Personal history of pulmonary embolism: Secondary | ICD-10-CM | POA: Diagnosis not present

## 2014-10-18 DIAGNOSIS — L97529 Non-pressure chronic ulcer of other part of left foot with unspecified severity: Secondary | ICD-10-CM

## 2014-10-18 DIAGNOSIS — R972 Elevated prostate specific antigen [PSA]: Secondary | ICD-10-CM

## 2014-10-18 DIAGNOSIS — Z8269 Family history of other diseases of the musculoskeletal system and connective tissue: Secondary | ICD-10-CM | POA: Diagnosis not present

## 2014-10-18 DIAGNOSIS — Z550 Illiteracy and low-level literacy: Secondary | ICD-10-CM

## 2014-10-18 DIAGNOSIS — Z8739 Personal history of other diseases of the musculoskeletal system and connective tissue: Secondary | ICD-10-CM | POA: Diagnosis not present

## 2014-10-18 DIAGNOSIS — G5793 Unspecified mononeuropathy of bilateral lower limbs: Secondary | ICD-10-CM

## 2014-10-18 DIAGNOSIS — G5792 Unspecified mononeuropathy of left lower limb: Secondary | ICD-10-CM | POA: Diagnosis not present

## 2014-10-18 DIAGNOSIS — S68119S Complete traumatic metacarpophalangeal amputation of unspecified finger, sequela: Secondary | ICD-10-CM | POA: Diagnosis not present

## 2014-10-18 DIAGNOSIS — L97519 Non-pressure chronic ulcer of other part of right foot with unspecified severity: Secondary | ICD-10-CM | POA: Diagnosis not present

## 2014-10-18 DIAGNOSIS — G5791 Unspecified mononeuropathy of right lower limb: Secondary | ICD-10-CM

## 2014-10-18 DIAGNOSIS — Z559 Problems related to education and literacy, unspecified: Secondary | ICD-10-CM

## 2014-10-18 DIAGNOSIS — E11621 Type 2 diabetes mellitus with foot ulcer: Secondary | ICD-10-CM

## 2014-10-18 NOTE — Patient Instructions (Addendum)
Please do see the foot doctor on Tuesday Today, the bases of the ulcers look very clean; you are doing a great job of taking care of your ulcers Watch for infection (fever, yellowish, thick drainage, red streaks going up the legs) GINA --> please request labs done recently I want to get records about his neuropathy, so please request records Return for follow-up in 2-3 weeks to go over all of your records together and your daughter is welcome to come, because I want to learn about all you

## 2014-10-18 NOTE — Progress Notes (Signed)
BP 117/64 mmHg  Pulse 99  Temp(Src) 97.6 F (36.4 C)  Ht _0  (1.753 m)  Wt 212 lb (96.163 kg)  BMI 31.29 kg/m2  SpO2 99%   Subjective:    Patient ID: [name], male    DOB: 01-04-1935, 80 y.o.   MRN: [id number]  HPI: BADR Pittman is a 80 y.o. male  Chief Complaint  Patient presents with  . Establish Care  Patient is here to establish care; he does not have any family with him today; he starts with just a flurry of statements  Patient cannot read or spell, "people will not listen to me"; "I have to stay with my sister"; "my daughter got medicine from [facility]"; he's gone 3 weeks with no medicine; he used to be on doxycycline; he needs "pill for infection"; he goes to the foot doctor, Dr. Caryl Comes, [address], hospital; appt is Tuesday the 6th; he will check his feet, they are in "bad shape"; "I hadn't had any medicine for 6 weeks" (now 6 weeks instead of 3 weeks); he has had foot infection ever since 2012; cut a corn off on the bottom; he almost lost his leg if he waited two more weeks  I interjected after a few minutes of this and tried to find out more about why he has foot infections in the first place, and asked about health issues; he says he has diabetes, "but I do heal quick"; his sugars "stay normal" all the time; he does not check FSBS  He has dense neuropathy; they have checked why he is numb and they said it's all diabetes; however, he had just told me that his sugars are normal  He does not get flu shots  He has had a umbilical hernia  He has elevated PSA but not cancer he tells me  He had a blood clot in the lung 2 years ago; finished anticoagulant  He has hx of vit B12 deficiency  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 Interim medical history since our last visit reviewed. Allergies and medications  reviewed and updated.  Review of Systems  Constitutional: Positive for unexpected weight change (he cut back on eating, lost weight on purpose).  HENT: Negative for hearing loss.   Respiratory: Positive for shortness of breath (every once in a while).   Cardiovascular: Negative for chest pain.  Neurological: Positive for numbness.   Per HPI unless specifically indicated above     Objective:    BP 117/64 mmHg  Pulse 99  Temp(Src) 97.6 F (36.4 C)  Ht _1  (1.753 m)  Wt 212 lb (96.163 kg)  BMI 31.29 kg/m2  SpO2 99%  Wt Readings from Last 3 Encounters:  10/23/14 214 lb (97.07 kg)  10/18/14 212 lb (96.163 kg)  03/04/11 198 lb (89.812 kg)    Physical Exam  Constitutional: He appears well-developed and well-nourished. No distress.  HENT:  Head: Normocephalic and atraumatic.  Eyes: EOM are normal. Right eye exhibits no discharge. Left eye exhibits no discharge. Right conjunctiva is not injected. Left conjunctiva is not injected. No scleral icterus.  Neck: No thyromegaly present.  Cardiovascular: Normal rate and regular rhythm.   Pulmonary/Chest: Effort normal and breath sounds normal.  Abdominal: Soft. Bowel sounds are normal. He exhibits no distension. There is no tenderness. There is no guarding. A hernia (large protruberant  umbilical hernia, soft, nontender) is present.  Musculoskeletal: He exhibits no edema.  Neurological: Coordination normal.  Skin: Skin is warm and dry. No pallor.  Psychiatric: He has a normal mood and affect. His behavior is normal. Judgment and thought content normal. His affect is not angry. He does not express inappropriate judgment. He does not exhibit a depressed mood. He exhibits normal remote memory.  Talkative, but redirectable; good eye contact with examiner; briefly tearful when discussing emotional event from years ago        Assessment & Plan:   Problem List Items Addressed This Visit      Digestive   Vitamin B12 deficiency    Need most  recent labs; draw if not done recently        Endocrine   Diabetic foot ulcer associated with type 2 diabetes mellitus - Primary    Chronic problem (bilateral foot ulcers); he has undergone treatment for osteomyelitis per his report; he has an established relationship with a podiatrist and has an appt next week; I will not order any additional imaging right now; no evidence of superficial infection today; areas redressed by CMA; will get records and review A1C      Relevant Medications   ramipril (ALTACE) 5 MG capsule   metFORMIN (GLUCOPHAGE) 500 MG tablet     Nervous and Auditory   Neuropathy of lower extremity    Extremely dense neuropathy which does not sound to be in accord with his level of diabetes; I need outside records to review with patient and family to find out if he's undergone work-up for multiple myeloma, heavy metals, etc.; will want to see B12, thyroid, and A1C; consider RPR testing if not already done; patient to return for 45 minute visit with family member      Relevant Medications   gabapentin (NEURONTIN) 300 MG capsule     Other   Amputation of finger of left hand    Noted and documented in the medical record      History of pulmonary embolism   Literacy problem    Patient cannot read or spell, so I asked him to please bring a family member with him to his next appointment      Umbilical hernia without obstruction or gangrene    Offered referral to surgeon for evaluation; reasons to go to ER reviewed      Abnormal prostate specific antigen    Per patient, this is elevated, but not cancer; review records, patient to return in a few weeks with family member      History of osteomyelitis    Per patient report, he sounds to have completed a recent course of antibiotics and has appt upcoming with podiatrist on Tuesday; keep that appt; afebrile here      RESOLVED: Family history of osteomyelitis      Follow up plan: Return in about 3 weeks (around  11/08/2014) for 45 minute visit, review multiple records.  More than 45 minutes spent face to face with patient today, >50% counseling, coordination of care  An after-visit summary was printed and given to the patient at [facility].  Please see the patient instructions which may contain other information and recommendations beyond what is mentioned above in the assessment and plan.

## 2014-10-23 ENCOUNTER — Emergency Department
Admission: EM | Admit: 2014-10-23 | Discharge: 2014-10-23 | Disposition: A | Discharge status: Home / Self Care | Payer: Medicare PPO | Attending: Emergency Medicine | Admitting: Emergency Medicine

## 2014-10-23 ENCOUNTER — Encounter: Payer: Self-pay | Admitting: Emergency Medicine

## 2014-10-23 ENCOUNTER — Ambulatory Visit (INDEPENDENT_AMBULATORY_CARE_PROVIDER_SITE_OTHER): Payer: Medicare PPO

## 2014-10-23 ENCOUNTER — Emergency Department: Payer: Medicare PPO

## 2014-10-23 DIAGNOSIS — E08621 Diabetes mellitus due to underlying condition with foot ulcer: Secondary | ICD-10-CM | POA: Diagnosis not present

## 2014-10-23 DIAGNOSIS — G629 Polyneuropathy, unspecified: Secondary | ICD-10-CM | POA: Insufficient documentation

## 2014-10-23 DIAGNOSIS — Z86711 Personal history of pulmonary embolism: Secondary | ICD-10-CM | POA: Insufficient documentation

## 2014-10-23 DIAGNOSIS — M1990 Unspecified osteoarthritis, unspecified site: Secondary | ICD-10-CM | POA: Insufficient documentation

## 2014-10-23 DIAGNOSIS — Z5189 Encounter for other specified aftercare: Secondary | ICD-10-CM | POA: Diagnosis not present

## 2014-10-23 DIAGNOSIS — L97529 Non-pressure chronic ulcer of other part of left foot with unspecified severity: Secondary | ICD-10-CM | POA: Diagnosis not present

## 2014-10-23 DIAGNOSIS — E538 Deficiency of other specified B group vitamins: Secondary | ICD-10-CM | POA: Insufficient documentation

## 2014-10-23 DIAGNOSIS — Z79899 Other long term (current) drug therapy: Secondary | ICD-10-CM | POA: Diagnosis not present

## 2014-10-23 DIAGNOSIS — E1140 Type 2 diabetes mellitus with diabetic neuropathy, unspecified: Secondary | ICD-10-CM | POA: Insufficient documentation

## 2014-10-23 DIAGNOSIS — I10 Essential (primary) hypertension: Secondary | ICD-10-CM | POA: Insufficient documentation

## 2014-10-23 DIAGNOSIS — M79671 Pain in right foot: Secondary | ICD-10-CM | POA: Diagnosis present

## 2014-10-23 DIAGNOSIS — I2510 Atherosclerotic heart disease of native coronary artery without angina pectoris: Secondary | ICD-10-CM | POA: Insufficient documentation

## 2014-10-23 DIAGNOSIS — Z8269 Family history of other diseases of the musculoskeletal system and connective tissue: Secondary | ICD-10-CM | POA: Insufficient documentation

## 2014-10-23 DIAGNOSIS — Z87891 Personal history of nicotine dependence: Secondary | ICD-10-CM | POA: Diagnosis not present

## 2014-10-23 DIAGNOSIS — Z550 Illiteracy and low-level literacy: Secondary | ICD-10-CM

## 2014-10-23 DIAGNOSIS — Z559 Problems related to education and literacy, unspecified: Secondary | ICD-10-CM | POA: Insufficient documentation

## 2014-10-23 DIAGNOSIS — K429 Umbilical hernia without obstruction or gangrene: Secondary | ICD-10-CM | POA: Insufficient documentation

## 2014-10-23 DIAGNOSIS — L97519 Non-pressure chronic ulcer of other part of right foot with unspecified severity: Secondary | ICD-10-CM | POA: Diagnosis not present

## 2014-10-23 DIAGNOSIS — S68119A Complete traumatic metacarpophalangeal amputation of unspecified finger, initial encounter: Secondary | ICD-10-CM | POA: Insufficient documentation

## 2014-10-23 LAB — COMPREHENSIVE METABOLIC PANEL
ALT: 14 U/L — ABNORMAL LOW (ref 17–63)
AST: 23 U/L (ref 15–41)
Albumin: 2.9 g/dL — ABNORMAL LOW (ref 3.5–5.0)
Alkaline Phosphatase: 73 U/L (ref 38–126)
Anion gap: 8 (ref 5–15)
BUN: 19 mg/dL (ref 6–20)
CHLORIDE: 105 mmol/L (ref 101–111)
CO2: 24 mmol/L (ref 22–32)
Calcium: 9.5 mg/dL (ref 8.9–10.3)
Creatinine, Ser: 1.03 mg/dL (ref 0.61–1.24)
GFR calc Af Amer: >60 mL/min (ref >60)
Glucose, Bld: 99 mg/dL (ref 65–99)
POTASSIUM: 4.1 mmol/L (ref 3.5–5.1)
Sodium: 137 mmol/L (ref 135–145)
Total Bilirubin: 0.3 mg/dL (ref 0.3–1.2)
Total Protein: 8.5 g/dL — ABNORMAL HIGH (ref 6.5–8.1)

## 2014-10-23 LAB — CBC WITH DIFFERENTIAL/PLATELET
Basophils Absolute: 0.1 10*3/uL (ref 0–0.1)
Basophils Relative: 1 %
EOS PCT: 3 %
Eosinophils Absolute: 0.3 10*3/uL (ref 0–0.7)
HCT: 32.9 % — ABNORMAL LOW (ref 40.0–52.0)
HEMOGLOBIN: 11 g/dL — AB (ref 13.0–18.0)
LYMPHS ABS: 1.5 10*3/uL (ref 1.0–3.6)
LYMPHS PCT: 14 %
MCH: 29.8 pg (ref 26.0–34.0)
MCHC: 33.4 g/dL (ref 32.0–36.0)
MCV: 89.2 fL (ref 80.0–100.0)
Monocytes Absolute: 0.9 10*3/uL (ref 0.2–1.0)
Monocytes Relative: 8 %
NEUTROS PCT: 74 %
Neutro Abs: 8.1 10*3/uL — ABNORMAL HIGH (ref 1.4–6.5)
Platelets: 418 10*3/uL (ref 150–440)
RBC: 3.69 MIL/uL — AB (ref 4.40–5.90)
RDW: 13.8 % (ref 11.5–14.5)
WBC: 10.8 10*3/uL — AB (ref 3.8–10.6)

## 2014-10-23 MED ORDER — SULFAMETHOXAZOLE-TRIMETHOPRIM 800-160 MG PO TABS: 1.0000 | ORAL_TABLET | Freq: Two times a day (BID) | ORAL | Status: DC

## 2014-10-23 NOTE — Assessment & Plan Note (Signed)
Need most recent labs; draw if not done recently

## 2014-10-23 NOTE — Discharge Instructions (Signed)
Please follow-up with wound clinic by calling the number provided. Please fill and take your antibiotics as written for its entire course. Return to the emergency department for any acutely concerning symptoms including fever, vomiting unable to keep down your antibiotics.   Diabetes and Foot Care Diabetes may cause you to have problems because of poor blood supply (circulation) to your feet and legs. This may cause the skin on your feet to become thinner, break easier, and heal more slowly. Your skin may become dry, and the skin may peel and crack. You may also have nerve damage in your legs and feet causing decreased feeling in them. You may not notice minor injuries to your feet that could lead to infections or more serious problems. Taking care of your feet is one of the most important things you can do for yourself.  HOME CARE INSTRUCTIONS  Wear shoes at all times, even in the house. Do not go barefoot. Bare feet are easily injured.  Check your feet daily for blisters, cuts, and redness. If you cannot see the bottom of your feet, use a mirror or ask someone for help.  Wash your feet with warm water (do not use hot water) and mild soap. Then pat your feet and the areas between your toes until they are completely dry. Do not soak your feet as this can dry your skin.  Apply a moisturizing lotion or petroleum jelly (that does not contain alcohol and is unscented) to the skin on your feet and to dry, brittle toenails. Do not apply lotion between your toes.  Trim your toenails straight across. Do not dig under them or around the cuticle. File the edges of your nails with an emery board or nail file.  Do not cut corns or calluses or try to remove them with medicine.  Wear clean socks or stockings every day. Make sure they are not too tight. Do not wear knee-high stockings since they may decrease blood flow to your legs.  Wear shoes that fit properly and have enough cushioning. To break in new  shoes, wear them for just a few hours a day. This prevents you from injuring your feet. Always look in your shoes before you put them on to be sure there are no objects inside.  Do not cross your legs. This may decrease the blood flow to your feet.  If you find a minor scrape, cut, or break in the skin on your feet, keep it and the skin around it clean and dry. These areas may be cleansed with mild soap and water. Do not cleanse the area with peroxide, alcohol, or iodine.  When you remove an adhesive bandage, be sure not to damage the skin around it.  If you have a wound, look at it several times a day to make sure it is healing.  Do not use heating pads or hot water bottles. They may burn your skin. If you have lost feeling in your feet or legs, you may not know it is happening until it is too late.  Make sure your health care provider performs a complete foot exam at least annually or more often if you have foot problems. Report any cuts, sores, or bruises to your health care provider immediately. SEEK MEDICAL CARE IF:   You have an injury that is not healing.  You have cuts or breaks in the skin.  You have an ingrown nail.  You notice redness on your legs or feet.  You feel  burning or tingling in your legs or feet.  You have pain or cramps in your legs and feet.  Your legs or feet are numb.  Your feet always feel cold. SEEK IMMEDIATE MEDICAL CARE IF:   There is increasing redness, swelling, or pain in or around a wound.  There is a red line that goes up your leg.  Pus is coming from a wound.  You develop a fever or as directed by your health care provider.  You notice a bad smell coming from an ulcer or wound. Document Released: 01/30/2000 Document Revised: 10/04/2012 Document Reviewed: 07/11/2012 [facility] Patient Information 2015 [place], [place]. This information is not intended to replace advice given to you by your health care provider. Make sure you discuss any  questions you have with your health care provider.  Skin Ulcer A skin ulcer is an open sore that can be shallow or deep. Skin ulcers sometimes become infected and are difficult to treat. It may be 1 month or longer before real healing progress is made. CAUSES   Injury.  Problems with the veins or arteries.  Diabetes.  Insect bites.  Bedsores.  Inflammatory conditions. SYMPTOMS   Pain, redness, swelling, and tenderness around the ulcer.  Fever.  Bleeding from the ulcer.  Yellow or clear fluid coming from the ulcer. DIAGNOSIS  There are many types of skin ulcers. Any open sores will be examined. Certain tests will be done to determine the kind of ulcer you have. The right treatment depends on the type of ulcer you have. TREATMENT  Treatment is a long-term challenge. It may include:  Wearing an elastic wrap, compression stockings, or gel cast over the ulcer area.  Taking antibiotic medicines or putting antibiotic creams on the affected area if there is an infection. HOME CARE INSTRUCTIONS  Put on your bandages (dressings), wraps, or casts over the ulcer as directed by your caregiver.  Change all dressings as directed by your caregiver.  Take all medicines as directed by your caregiver.  Keep the affected area clean and dry.  Avoid injuries to the affected area.  Eat a well-balanced, healthy diet that includes plenty of fruit and vegetables.  If you smoke, consider quitting or decreasing the amount of cigarettes you smoke.  Once the ulcer heals, get regular exercise as directed by your caregiver.  Work with your caregiver to make sure your blood pressure, cholesterol, and diabetes are well-controlled.  Keep your skin moisturized. Dry skin can crack and lead to skin ulcers. SEEK IMMEDIATE MEDICAL CARE IF:   Your pain gets worse.  You have swelling, redness, or fluids around the ulcer.  You have chills.  You have a fever. MAKE SURE YOU:   Understand these  instructions.  Will watch your condition.  Will get help right away if you are not doing well or get worse. Document Released: 03/11/2004 Document Revised: 04/26/2011 Document Reviewed: 09/18/2010 [facility] Patient Information 2015 [place], [place]. This information is not intended to replace advice given to you by your health care provider. Make sure you discuss any questions you have with your health care provider.

## 2014-10-23 NOTE — ED Provider Notes (Signed)
[facility] Emergency Department Provider Note  Time seen: 6:17 PM  I have reviewed the triage vital signs and the nursing notes.   HISTORY  Chief Complaint Foot Pain    HPI [name] is a 80 y.o. male with a past medical history of COPD, diabetes, GERD, hyperlipidemia, hypertension, arthritis, peripheral neuropathy who presents to the emergency department with bilateral foot ulcerations. According to the patient he's had ulcerations for approximately one year, but several which have worsened recently. Denies any fever, nausea, vomiting. Patient states he attempted to go to a primary care physician today but due to finances he was turned away so he came to the emergency department for evaluation.Describes a mild burning sensation in his feet, but states he has minimal feeling and his feet.    Past Medical History  Diagnosis Date  . COPD (chronic obstructive pulmonary disease)   . Diabetes mellitus without complication   . History of blood clots   . GERD (gastroesophageal reflux disease)   . Hyperlipidemia   . Hypertension   . CAD (coronary artery disease)   . Osteomyelitis   . Arthritis   . PN (peripheral neuropathy)     There are no active problems to display for this patient.   Past Surgical History  Procedure Laterality Date  . Pacemaker insertion    . Appendectomy    . Coronary artery bypass graft  2002  . Clavicle surgery  2012    open reduction and internal fixation of left clavicle  . Amputation 4th finger Left     Current Outpatient Rx  Name  Route  Sig  Dispense  Refill  . cetirizine (ZYRTEC) 10 MG tablet   Oral   Take 10 mg by mouth daily.         [name] gabapentin (NEURONTIN) 300 MG capsule   Oral   Take 300 mg by mouth 3 (three) times daily.         . metFORMIN (GLUCOPHAGE) 500 MG tablet   Oral   Take 500 mg by mouth daily.         [name] omeprazole (PRILOSEC) 40 MG capsule   Oral   Take 40 mg by mouth daily.          . ramipril (ALTACE) 5 MG capsule   Oral   Take 5 mg by mouth daily.           Allergies Percocet  Family History  Problem Relation Age of Onset  . Cancer Mother     spine  . Hypertension Mother   . Mental illness Mother   . Cancer Father     lung  . Asthma Father   . Allergic rhinitis Father   . Arthritis Father     Social History Social History  Substance Use Topics  . Smoking status: Former Smoker -- 0.25 packs/day    Types: Cigarettes, Cigars    Quit date: 10/18/1979  . Smokeless tobacco: Never Used  . Alcohol Use: No    Review of Systems Constitutional: Negative for fever. Cardiovascular: Negative for chest pain. Respiratory: Negative for shortness of breath. Gastrointestinal: Negative for abdominal pain, vomiting and diarrhea. Musculoskeletal: Burning in bilateral feet at times. Skin: Ulcerations to the plantar aspect of bilateral feet Neurological: Negative for headache 10-point ROS otherwise negative.  ____________________________________________   PHYSICAL EXAM:  VITAL SIGNS: ED Triage Vitals  Enc Vitals Group     BP 10/23/14 1518 124/64 mmHg     Pulse Rate 10/23/14  1518 80     Resp 10/23/14 1518 20     Temp 10/23/14 1518 98 F (36.7 C)     Temp Source 10/23/14 1518 Oral     SpO2 10/23/14 1518 97 %     Weight 10/23/14 1518 214 lb (97.07 kg)     Height 10/23/14 1518 6' (1.829 m)     Head Cir --      Peak Flow --      Pain Score 10/23/14 1519 9     Pain Loc --      Pain Edu? --      Excl. in GC? --     Constitutional: Alert and oriented. Well appearing and in no distress. Eyes: Normal exam ENT   Mouth/Throat: Mucous membranes are moist. Cardiovascular: Normal rate, regular rhythm Respiratory: Normal respiratory effort without tachypnea nor retractions. Breath sounds are clear and equal bilaterally. No wheezes/rales/rhonchi. Gastrointestinal: Soft and nontender. No distention. Musculoskeletal: Patient with 2 ulcerations to the  plantar aspect of the right foot, and an ulceration to the plantar aspect of the left foot. They largely. Chronic, do not appear overly infected at they do show signs of new granulation tissue. Neurologic:  Normal speech and language. No gross focal neurologic deficits Skin:  Skin is warm, ulcerations as above. Psychiatric: Mood and affect are normal. Speech and behavior are normal.   ____________________________________________    RADIOLOGY  X-rays show deep wound infection but do not show any signs of osteomyelitis.  ____________________________________________   INITIAL IMPRESSION / ASSESSMENT AND PLAN / ED COURSE  Pertinent labs & imaging results that were available during my care of the patient were reviewed by me and considered in my medical decision making (see chart for details).  Patient with chronic diabetic foot ulcers. Patient does not currently undergo any treatment for set ulcers. We will obtain x-rays to evaluate for osteomyelitis. Overall appear fairly well for how large they are. We will check x-rays, and refer the patient to wound clinic. I discussed with the patient the need to pads them heavily with gauze, wrap daily. Patient is agreeable to plan. No signs of osteomyelitis. We will discharge patient home on antibiotics with a wound care referral. We will dress the patient's wound was Xeroform and wrapped with gauze. Patient agreeable to plan.   ____________________________________________   FINAL CLINICAL IMPRESSION(S) / ED DIAGNOSES  Diabetic foot ulcers bilateral   Minna Antis, MD 10/23/14 1905

## 2014-10-23 NOTE — ED Notes (Signed)
Pt presents with foot pain and large ulcers to the bottom of both feet. Open, oozing, yellow in color, foul odor drainage noted bilaterally. Pt reports burning sensation to both feet.

## 2014-10-23 NOTE — ED Notes (Signed)
Pt presents with bilateral foot pain and swelling. Pt was sent over by PCP for further eval of foot swelling and ulcerations. Pt c/o is chronic.

## 2014-10-23 NOTE — Assessment & Plan Note (Signed)
Noted and documented in the medical record

## 2014-10-23 NOTE — Assessment & Plan Note (Signed)
Patient cannot read or spell, so I asked him to please bring a family member with him to his next appointment

## 2014-10-23 NOTE — Progress Notes (Signed)
Patient came in for a wound wrap. He had attempted to wrap his feet but he wasn't able to do a good job himself. He went to podiatry today but they refused to see him because he did not have his co-pay. He asked that I wrap his feet up. Bilateral feet re-wrapped. I asked patient if he'd want to have home health come in and help him with wound care and he said yes. Patient is very concerned that he is going to loose both of his feet and wants to go to the hospital for further treatment. I called and let them know he'd be coming.

## 2014-10-23 NOTE — Assessment & Plan Note (Signed)
Chronic problem (bilateral foot ulcers); he has undergone treatment for osteomyelitis per his report; he has an established relationship with a podiatrist and has an appt next week; I will not order any additional imaging right now; no evidence of superficial infection today; areas redressed by CMA; will get records and review A1C

## 2014-10-23 NOTE — Assessment & Plan Note (Signed)
Offered referral to surgeon for evaluation; reasons to go to ER reviewed

## 2014-10-23 NOTE — Assessment & Plan Note (Signed)
Per patient, this is elevated, but not cancer; review records, patient to return in a few weeks with family member

## 2014-10-23 NOTE — Assessment & Plan Note (Signed)
Per patient report, he sounds to have completed a recent course of antibiotics and has appt upcoming with podiatrist on Tuesday; keep that appt; afebrile here

## 2014-10-23 NOTE — Assessment & Plan Note (Signed)
Extremely dense neuropathy which does not sound to be in accord with his level of diabetes; I need outside records to review with patient and family to find out if he's undergone work-up for multiple myeloma, heavy metals, etc.; will want to see B12, thyroid, and A1C; consider RPR testing if not already done; patient to return for 45 minute visit with family member

## 2014-10-25 ENCOUNTER — Encounter: Payer: Medicare PPO | Attending: Surgery | Admitting: Surgery

## 2014-10-25 DIAGNOSIS — I10 Essential (primary) hypertension: Secondary | ICD-10-CM | POA: Insufficient documentation

## 2014-10-25 DIAGNOSIS — J449 Chronic obstructive pulmonary disease, unspecified: Secondary | ICD-10-CM | POA: Diagnosis not present

## 2014-10-25 DIAGNOSIS — G629 Polyneuropathy, unspecified: Secondary | ICD-10-CM | POA: Insufficient documentation

## 2014-10-25 DIAGNOSIS — L97513 Non-pressure chronic ulcer of other part of right foot with necrosis of muscle: Secondary | ICD-10-CM | POA: Insufficient documentation

## 2014-10-25 DIAGNOSIS — L97523 Non-pressure chronic ulcer of other part of left foot with necrosis of muscle: Secondary | ICD-10-CM | POA: Diagnosis not present

## 2014-10-25 DIAGNOSIS — E11621 Type 2 diabetes mellitus with foot ulcer: Secondary | ICD-10-CM | POA: Insufficient documentation
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
## 2014-10-26 NOTE — Progress Notes (Signed)
[name], [name] ([id number]) Visit Report for 10/25/2014 Abuse/Suicide Risk Screen Details Patient Name: [name], [name] Date of Service: 10/25/2014 11:00 AM Medical Record Number: [id number] Patient Account Number: [id number] Date of Birth/Sex: 10/14/1934 (80 y.o. Male) Treating RN: Curtis Sites Primary Care Physician: [name], MELINDA Other Clinician: Referring Physician: Daryel November Treating Physician/Extender: Rudene Re in Treatment: 0 Abuse/Suicide Risk Screen Items Answer ABUSE/SUICIDE RISK SCREEN: Has anyone close to you tried to hurt or harm you recentlyo No Do you feel uncomfortable with anyone in your familyo No Has anyone forced you do things that you didnot want to doo No Do you have any thoughts of harming yourselfo No Patient displays signs or symptoms of abuse and/or neglect. No Electronic Signature(s) Signed: 10/25/2014 4:53:10 PM By: Curtis Sites Entered By: Curtis Sites on 10/25/2014 11:30:25 [name], [name] ([id number]) -------------------------------------------------------------------------------- Activities of Daily Living Details Patient Name: [name] Date of Service: 10/25/2014 11:00 AM Medical Record Number: [id number] Patient Account Number: [id number] Date of Birth/Sex: [birth year] (80 y.o. Male) Treating RN: Curtis Sites Primary Care Physician: [name], MELINDA Other Clinician: Referring Physician: Daryel November Treating Physician/Extender: Rudene Re in Treatment: 0 Activities of Daily Living Items Answer Activities of Daily Living (Please select one for each item) Drive Automobile Completely Able Take Medications Completely Able Use Telephone Completely Able Care for Appearance Completely Able Use Toilet Completely Able Bath / Shower Completely Able Dress Self Completely Able Feed Self Completely Able Walk Completely Able Get In / Out Bed Completely Able Housework Completely Able Prepare Meals  Completely Able Handle Money Completely Able Shop for Self Completely Able Electronic Signature(s) Signed: 10/25/2014 4:53:10 PM By: Curtis Sites Entered By: Curtis Sites on 10/25/2014 11:30:43 [name], [name] ([id number]) -------------------------------------------------------------------------------- Education Assessment Details Patient Name: [name] Date of Service: 10/25/2014 11:00 AM Medical Record Number: [id number] Patient Account Number: [id number] Date of Birth/Sex: 1934-09-17 (80 y.o. Male) Treating RN: Curtis Sites Primary Care Physician: [name], MELINDA Other Clinician: Referring Physician: Daryel November Treating Physician/Extender: Rudene Re in Treatment: 0 Primary Learner Assessed: Patient Learning Preferences/Education Level/Primary Language Learning Preference: Explanation, Demonstration Highest Education Level: Grade School Preferred Language: English Cognitive Barrier Assessment/Beliefs Language Barrier: No Translator Needed: No Memory Deficit: No Emotional Barrier: No Cultural/Religious Beliefs Affecting Medical No Care: Physical Barrier Assessment Impaired Vision: No Impaired Hearing: No Decreased Hand dexterity: No Knowledge/Comprehension Assessment Knowledge Level: Low Comprehension Level: Low Ability to understand written Low instructions: Ability to understand verbal Low instructions: Motivation Assessment Anxiety Level: Anxious Cooperation: Cooperative Education Importance: Acknowledges Need Interest in Health Problems: Uninterested Perception: Coherent Willingness to Engage in Self- Low Management Activities: Readiness to Engage in Self- Low Management Activities: Electronic Signature(s) [name], [name] ([id number]) Signed: 10/25/2014 4:53:10 PM By: Curtis Sites Entered By: Curtis Sites on 10/25/2014 11:31:32 [name], [name]  ([id number]) -------------------------------------------------------------------------------- Fall Risk Assessment Details Patient Name: [name] Date of Service: 10/25/2014 11:00 AM Medical Record Number: [id number] Patient Account Number: [id number] Date of Birth/Sex: 1934/12/21 (80 y.o. Male) Treating RN: Curtis Sites Primary Care Physician: [name], MELINDA Other Clinician: Referring Physician: Daryel November Treating Physician/Extender: Rudene Re in Treatment: 0 Fall Risk Assessment Items FALL RISK ASSESSMENT: History of falling - immediate or within 3 months 0 No Secondary diagnosis 0 No Ambulatory aid None/bed rest/wheelchair/nurse 0 Yes Crutches/cane/walker 0 No Furniture 0 No IV Access/Saline Lock 0 No Gait/Training Normal/bed rest/immobile 0 No Weak 10 Yes Impaired 0 No Mental Status Oriented to own ability 0 Yes Electronic Signature(s) Signed: 10/25/2014 4:53:10 PM  By: Curtis Sites Entered By: Curtis Sites on 10/25/2014 11:31:51 [name], [name] ([id number]) -------------------------------------------------------------------------------- Foot Assessment Details Patient Name: [name] Date of Service: 10/25/2014 11:00 AM Medical Record Number: [id number] Patient Account Number: [id number] Date of Birth/Sex: [birth year] (80 y.o. Male) Treating RN: Curtis Sites Primary Care Physician: [name], MELINDA Other Clinician: Referring Physician: Daryel November Treating Physician/Extender: Rudene Re in Treatment: 0 Foot Assessment Items Site Locations + = Sensation present, - = Sensation absent, C = Callus, U = Ulcer R = Redness, W = Warmth, M = Maceration, PU = Pre-ulcerative lesion F = Fissure, S = Swelling, D = Dryness Assessment Right: Left: Other Deformity: No No Prior Foot Ulcer: No No Prior Amputation: No No Charcot Joint: No No Ambulatory Status: Ambulatory Without Help Gait: Unsteady Electronic  Signature(s) Signed: 10/25/2014 4:53:10 PM By: Curtis Sites Entered By: Curtis Sites on 10/25/2014 11:40:32 [name], [name] ([id number]) -------------------------------------------------------------------------------- Nutrition Risk Assessment Details Patient Name: [name] Date of Service: 10/25/2014 11:00 AM Medical Record Number: [id number] Patient Account Number: [id number] Date of Birth/Sex: 1934-04-28 (80 y.o. Male) Treating RN: Curtis Sites Primary Care Physician: [name], MELINDA Other Clinician: Referring Physician: Daryel November Treating Physician/Extender: Rudene Re in Treatment: 0 Height (in): 70 Weight (lbs): 210 Body Mass Index (BMI): 30.1 Nutrition Risk Assessment Items NUTRITION RISK SCREEN: I have an illness or condition that made me change the kind and/or 0 No amount of food I eat I eat fewer than two meals per day 0 No I eat few fruits and vegetables, or milk products 0 No I have three or more drinks of beer, liquor or wine almost every day 0 No I have tooth or mouth problems that make it hard for me to eat 0 No I don't always have enough money to buy the food I need 0 No I eat alone most of the time 0 No I take three or more different prescribed or over-the-counter drugs a 1 Yes day Without wanting to, I have lost or gained 10 pounds in the last six 0 No months I am not always physically able to shop, cook and/or feed myself 0 No Nutrition Protocols Good Risk Protocol Moderate Risk Protocol Electronic Signature(s) Signed: 10/25/2014 4:53:10 PM By: Curtis Sites Entered By: Curtis Sites on 10/25/2014 11:31:58

## 2014-10-26 NOTE — Progress Notes (Signed)
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 3.299cm^2 area and 0.99cm^3 volume. The wound is limited to skin breakdown. There is no tunneling or undermining noted. There is a medium amount of serous drainage noted. The wound margin is distinct with the outline attached to the wound base. There is large (67-100%) red, pink granulation within the wound bed. There is a small (1-33%) amount of necrotic tissue within the wound bed including Eschar. The periwound skin appearance exhibited: Callus. The periwound skin appearance did not exhibit: Crepitus, Excoriation,  Fluctuance, Friable, Induration, Localized Edema, Rash, Scarring, Dry/Scaly, Maceration, Moist, Atrophie Blanche, Cyanosis, Ecchymosis, Hemosiderin Staining, Mottled, Pallor, Rubor, Erythema. Periwound temperature was noted as No Abnormality. 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 however he is in need of sharp debridement as I believe there is a lot of boggy swelling which may contain necrotic debris and this may prove down to bone. I need to speak to a family member as the patient is illiterate and does not comprehend very well. He will need a surgical referral for debridement in the OR and appropriate postoperative x-rays. Recent x-rays in the ER did not show definite osteomyelitis. The patient says he will bring his family member and come back today and hopefully we'll be able to get this started out. Procedures Wound #3 Wound #3 is a Diabetic Wound/Ulcer of the Lower Extremity located on the Right,Plantar Metatarsal head fifth . There was a Skin/Subcutaneous Tissue Debridement ([id number]) debridement with total area of 4.2 sq cm performed by Shamiya Demeritt, Ignacia Felling., MD. with the following instrument(s): Forceps and Scissors to remove Viable and Non-Viable tissue/material including Exudate, Fibrin/Slough, Skin, Callus, and Subcutaneous after achieving pain control using Other (lidocaine 4%). A time out was conducted prior to the start of the procedure. A Large amount of bleeding was controlled with Silver Nitrate. The procedure was not tolerated well with a pain level of 0 throughout and a pain level of 0 following the procedure. Post Debridement Measurements: 2.5cm length x 2.5cm width x 0.4cm depth; 1.963cm^3 volume. Post procedure Diagnosis Wound #3: Same as Pre-Procedure Plan Wound Cleansing: Wound #1 Left,Plantar Foot: Desouza, Cheron E. ([id number]) Clean wound with Normal Saline. Wound #2 Right,Plantar Metatarsal head first: Clean wound with Normal Saline. Wound #3 Right,Plantar Metatarsal head fifth: Clean wound with Normal  Saline. Anesthetic: Wound #1 Left,Plantar Foot: Topical Lidocaine 4% cream applied to wound bed prior to debridement Wound #2 Right,Plantar Metatarsal head first: Topical Lidocaine 4% cream applied to wound bed prior to debridement Wound #3 Right,Plantar Metatarsal head fifth: Topical Lidocaine 4% cream applied to wound bed prior to debridement Primary Wound Dressing: Wound #1 Left,Plantar Foot: Aquacel Ag Wound #2 Right,Plantar Metatarsal head first: Aquacel Ag Wound #3 Right,Plantar Metatarsal head fifth: Aquacel Ag Secondary Dressing: Wound #1 Left,Plantar Foot: ABD and Kerlix/Conform Wound #2 Right,Plantar Metatarsal head first: ABD and Kerlix/Conform Wound #3 Right,Plantar Metatarsal head fifth: ABD and Kerlix/Conform Dressing Change Frequency: Wound #1 Left,Plantar Foot: Change dressing every day. Wound #2 Right,Plantar Metatarsal head first: Change dressing every day. Wound #3 Right,Plantar Metatarsal head fifth: Change dressing every day. Follow-up Appointments: Wound #1 Left,Plantar Foot: Return Appointment in 1 week. Wound #2 Right,Plantar Metatarsal head first: Return Appointment in 1 week. Wound #3 Right,Plantar Metatarsal head fifth: Return Appointment in 1 week. Off-Loading: Wound #1 Left,Plantar Foot: Open toe surgical shoe with peg assist. Wound #2 Right,Plantar Metatarsal head first: Open toe surgical shoe with peg assist. Wound #3 Right,Plantar Metatarsal head fifth: Open toe surgical shoe with peg assist. Additional Orders / Instructions: Wound #1 Left,Plantar Foot: Increase protein intake. Wound #2 Right,Plantar Metatarsal head first: [name], [name] ([id number]) Increase  protein intake. Wound #3 Right,Plantar Metatarsal head fifth: Increase protein intake. Home Health: Wound #1 Left,Plantar Foot: Initiate Home Health for Skilled Nursing Home Health Nurse may visit PRN to address patient s wound care needs. FACE TO FACE ENCOUNTER: MEDICARE  and MEDICAID PATIENTS: I certify that this patient is under my care and that I had a face-to-face encounter that meets the physician face-to-face encounter requirements with this patient on this date. The encounter with the patient was in whole or in part for the following MEDICAL CONDITION: (primary reason for Home Healthcare) MEDICAL NECESSITY: I certify, that based on my findings, NURSING services are a medically necessary home health service. HOME BOUND STATUS: I certify that my clinical findings support that this patient is homebound (i.e., Due to illness or injury, pt requires aid of supportive devices such as crutches, cane, wheelchairs, walkers, the use of special transportation or the assistance of another person to leave their place of residence. There is a normal inability to leave the home and doing so requires considerable and taxing effort. Other absences are for medical reasons / religious services and are infrequent or of short duration when for other reasons). If current dressing causes regression in wound condition, may D/C ordered dressing product/s and apply Normal Saline Moist Dressing daily until next Wound Healing Center / Other MD appointment. Notify Wound Healing Center of regression in wound condition at [phone]. Please direct any NON-WOUND related issues/requests for orders to patient's Primary Care Physician Wound #2 Right,Plantar Metatarsal head first: Initiate Home Health for Skilled Nursing Home Health Nurse may visit PRN to address patient s wound care needs. FACE TO FACE ENCOUNTER: MEDICARE and MEDICAID PATIENTS: I certify that this patient is under my care and that I had a face-to-face encounter that meets the physician face-to-face encounter requirements with this patient on this date. The encounter with the patient was in whole or in part for the following MEDICAL CONDITION: (primary reason for Home Healthcare) MEDICAL NECESSITY: I certify, that based on  my findings, NURSING services are a medically necessary home health service. HOME BOUND STATUS: I certify that my clinical findings support that this patient is homebound (i.e., Due to illness or injury, pt requires aid of supportive devices such as crutches, cane, wheelchairs, walkers, the use of special transportation or the assistance of another person to leave their place of residence. There is a normal inability to leave the home and doing so requires considerable and taxing effort. Other absences are for medical reasons / religious services and are infrequent or of short duration when for other reasons). If current dressing causes regression in wound condition, may D/C ordered dressing product/s and apply Normal Saline Moist Dressing daily until next Wound Healing Center / Other MD appointment. Notify Wound Healing Center of regression in wound condition at [phone]. Please direct any NON-WOUND related issues/requests for orders to patient's Primary Care Physician Wound #3 Right,Plantar Metatarsal head fifth: Initiate Home Health for Skilled Nursing Home Health Nurse may visit PRN to address patient s wound care needs. FACE TO FACE ENCOUNTER: MEDICARE and MEDICAID PATIENTS: I certify that this patient is under my care and that I had a face-to-face encounter that meets the physician face-to-face encounter requirements with this patient on this date. The encounter with the patient was in whole or in part for the following MEDICAL CONDITION: (primary reason for Home Healthcare) MEDICAL NECESSITY: I certify, that based on my findings, NURSING services are a medically necessary home health service. HOME BOUND STATUS: I  certify that my clinical findings support that this patient is homebound (i.e., Due to illness or injury, pt requires aid of supportive devices such as crutches, cane, wheelchairs, walkers, the use of special transportation or the assistance of another person to leave their  place of residence. There is a normal inability to leave the home and doing so requires considerable and taxing effort. Other absences are for medical reasons / religious services and are infrequent or of short duration when for other reasons). [name], [name] ([id number]) If current dressing causes regression in wound condition, may D/C ordered dressing product/s and apply Normal Saline Moist Dressing daily until next Wound Healing Center / Other MD appointment. Notify Wound Healing Center of regression in wound condition at [phone]. Please direct any NON-WOUND related issues/requests for orders to patient's Primary Care Physician Services and Therapies ordered were: Arterial Studies- Bilateral, Venous Studies -Bilateral This 80 year old diabetic patient was been noncompliant and has not seen doctors in a regular basis for a while has bilateral diffuse which may be Wagner grade 2. however he is in need of sharp debridement as I believe there is a lot of boggy swelling which may contain necrotic debris and this may prove down to bone. I need to speak to a family member as the patient is illiterate and does not comprehend very well. He will need a surgical referral for debridement in the OR and appropriate postoperative x-rays. Recent x-rays in the ER did not show definite osteomyelitis. The patient says he will bring his family member and come back today and hopefully we'll be able to get this started out. Electronic Signature(s) Signed: 10/25/2014 1:01:39 PM By: Evlyn Kanner MD, FACS Entered By: Evlyn Kanner on 10/25/2014 13:01:39 [name], [name] ([id number]) -------------------------------------------------------------------------------- ROS/PFSH Details Patient Name: [name] Date of Service: 10/25/2014 11:00 AM Medical Record Number: [id number] Patient Account Number: [id number] Date of Birth/Sex: May 26, 1934 (79 y.o. Male) Treating RN: Curtis Sites Primary Care  Physician: [name], MELINDA Other Clinician: Referring Physician: Daryel November Treating Physician/Extender: Rudene Re in Treatment: 0 Information Obtained From Patient Wound History Do you currently have one or more open woundso Yes How many open wounds do you currently haveo 2 Approximately how long have you had your woundso 3 weeks Has your wound(s) ever healed and then re-openedo No Have you had any lab work done in the past montho No Have you tested positive for an antibiotic resistant organism (MRSA, VRE)o No Have you tested positive for osteomyelitis (bone infection)o Yes Have you had any tests for circulation on your legso No Have you had other problems associated with your woundso Swelling Constitutional Symptoms (General Health) Complaints and Symptoms: No Complaints or Symptoms Eyes Complaints and Symptoms: No Complaints or Symptoms Ear/Nose/Mouth/Throat Complaints and Symptoms: No Complaints or Symptoms Hematologic/Lymphatic Complaints and Symptoms: No Complaints or Symptoms Respiratory Medical History: Positive for: Chronic Obstructive Pulmonary Disease (COPD) Cardiovascular Complaints and Symptoms: No Complaints or Symptoms [name], MEREDITH MELLS ([id number]) Medical History: Positive for: Coronary Artery Disease; Hypertension Past Medical History Notes: HDL Gastrointestinal Complaints and Symptoms: No Complaints or Symptoms Endocrine Medical History: Positive for: Type II Diabetes Time with diabetes: unknown Blood sugar tested every day: No Genitourinary Complaints and Symptoms: No Complaints or Symptoms Immunological Complaints and Symptoms: No Complaints or Symptoms Integumentary (Skin) Complaints and Symptoms: No Complaints or Symptoms Musculoskeletal Complaints and Symptoms: No Complaints or Symptoms Neurologic Medical History: Positive for: Neuropathy Oncologic Complaints and Symptoms: No Complaints or  Symptoms Psychiatric Complaints and Symptoms: No Complaints  or Symptoms Ishman, DAMARIOUS HOLTSCLAW ([id number]) Family and Social History Cancer: Yes - Mother, Father; Diabetes: No; Heart Disease: Yes - Mother; Hereditary Spherocytosis: No; Hypertension: Yes - Mother; Kidney Disease: No; Lung Disease: Yes - Father; Seizures: No; Stroke: No; Thyroid Problems: No; Tuberculosis: No; Never smoker; Marital Status - Separated; Alcohol Use: Never; Drug Use: No History; Caffeine Use: Never; Financial Concerns: No; Food, Clothing or Shelter Needs: No; Support System Lacking: No; Transportation Concerns: No; Advanced Directives: No; Patient does not want information on Advanced Directives Physician Affirmation I have reviewed and agree with the above information. Electronic Signature(s) Signed: 10/25/2014 11:46:07 AM By: Evlyn Kanner MD, FACS Signed: 10/25/2014 4:53:10 PM By: Curtis Sites Previous Signature: 10/25/2014 11:45:36 AM Version By: Evlyn Kanner MD, FACS Entered By: Evlyn Kanner on 10/25/2014 11:46:06 Cubillos, AARNAV STEAGALL ([id number]) -------------------------------------------------------------------------------- SuperBill Details Patient Name: [name] Date of Service: 10/25/2014 Medical Record Number: [id number] Patient Account Number: [id number] Date of Birth/Sex: [birth year] (80 y.o. 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 L97.523 Non-pressure chronic ulcer of other part of left foot with necrosis of muscle Facility Procedures CPT4: Description Modifier Quantity Code [id number] - WOUND CARE VISIT-LEV 3 EST PT 1 CPT4: 52841324 11042 - DEB SUBQ TISSUE  20 SQ CM/< 1 ICD-10 Description Diagnosis E11.621 Type 2 diabetes mellitus with foot ulcer L97.513 Non-pressure chronic ulcer of other part of right foot with necrosis of muscle L97.523 Non-pressure chronic ulcer of  other part of left foot with necrosis of muscle Physician Procedures CPT4: Description Modifier Quantity Code [id number] - WC PHYS LEVEL 4 - NEW PT 1 ICD-10 Description Diagnosis E11.621 Type 2 diabetes mellitus with foot ulcer L97.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

## 2014-10-28 ENCOUNTER — Telehealth: Payer: Self-pay | Admitting: Family Medicine

## 2014-10-28 ENCOUNTER — Ambulatory Visit (INDEPENDENT_AMBULATORY_CARE_PROVIDER_SITE_OTHER): Payer: Medicare PPO | Admitting: Family Medicine

## 2014-10-28 VITALS — BP 131/63 | HR 79 | Temp 98.8°F

## 2014-10-28 DIAGNOSIS — E11621 Type 2 diabetes mellitus with foot ulcer: Secondary | ICD-10-CM

## 2014-10-28 DIAGNOSIS — E8809 Other disorders of plasma-protein metabolism, not elsewhere classified: Secondary | ICD-10-CM | POA: Diagnosis not present

## 2014-10-28 DIAGNOSIS — E538 Deficiency of other specified B group vitamins: Secondary | ICD-10-CM | POA: Diagnosis not present

## 2014-10-28 DIAGNOSIS — G5791 Unspecified mononeuropathy of right lower limb: Secondary | ICD-10-CM

## 2014-10-28 DIAGNOSIS — Z550 Illiteracy and low-level literacy: Secondary | ICD-10-CM | POA: Diagnosis not present

## 2014-10-28 DIAGNOSIS — G5792 Unspecified mononeuropathy of left lower limb: Secondary | ICD-10-CM

## 2014-10-28 DIAGNOSIS — Z559 Problems related to education and literacy, unspecified: Secondary | ICD-10-CM

## 2014-10-28 DIAGNOSIS — Z8739 Personal history of other diseases of the musculoskeletal system and connective tissue: Secondary | ICD-10-CM | POA: Diagnosis not present

## 2014-10-28 DIAGNOSIS — L97509 Non-pressure chronic ulcer of other part of unspecified foot with unspecified severity: Secondary | ICD-10-CM

## 2014-10-28 DIAGNOSIS — G5793 Unspecified mononeuropathy of bilateral lower limbs: Secondary | ICD-10-CM

## 2014-10-28 NOTE — Telephone Encounter (Signed)
Discussed with office manager; appropriate person has been notified by her

## 2014-10-28 NOTE — Assessment & Plan Note (Signed)
Continue current care, TMP/SMX DS BID; wound care with Santyl collagenase, covering with gauze; will get home health involved for dressings and patient advocacy

## 2014-10-28 NOTE — Progress Notes (Signed)
BP 131/63 mmHg  Pulse 79  Temp(Src) 98.8 F (37.1 C)  Vitals WERE in fact taken, but not entered by CMA; temp was 98.8; he was worked in late in the day upon appearing at the window, notes put on paper, not transferred to computer unfortunately; BP was controlled, but I do not remember the numbers; he was not tachycardic; weight not taken  --Dr. Sherie Don  Subjective:    Patient ID: [name], male    DOB: 04-12-1934, 80 y.o.   MRN: [id number]  HPI: [name] is a 80 y.o. male  No chief complaint on file.  CC: I want to have my feet checked  Patient arrived at the window today and wanted his ulcers looked at (diabetic foot ulcers)  Foot ulcers; these have been going on for some time (months); he was scheduled to be seen by wound clinic today, but they cancelled his appointment and rescheduled it for tomorrow; he went to the podiatrist but won't go back there He was seen in the ER last week and started antibiotics, but he doesn't know the name; had them filled at [facility]; I called pharmacy; confirmed in med list  He takes a bath every day; keeps his feet clean; using collagenase Santyl ointment and then covers the ulcers with gauze, then wraps them Does not have a thermometer to measure temperature at home  Temp here 98.8; he does have night sweats, fevers; changed the covers; started antibiotics the 8th or 9th  Silvano Bilis, [phone]  Relevant past medical, surgical, family and social history reviewed and updated as indicated. Interim medical history since our last visit reviewed. Allergies and medications reviewed and updated.  Review of Systems Per HPI unless specifically indicated above     Objective:    BP 131/63 mmHg  Pulse 79  Temp(Src) 98.8 F (37.1 C)  Wt Readings from Last 3 Encounters:  10/23/14 214 lb (97.07 kg)  10/18/14 212 lb (96.163 kg)  03/04/11 198 lb (89.812 kg)    Physical Exam  Constitutional: He appears well-developed and  well-nourished. No distress.  Eyes: EOM are normal. No scleral icterus.  Neck: No thyromegaly present.  Cardiovascular: Normal rate and regular rhythm.   Pulmonary/Chest: Effort normal and breath sounds normal.  Abdominal: Soft. He exhibits no distension.  Musculoskeletal: He exhibits no edema.  Foot ulcers relatively unchanged from previous visit, two on the plantar surface of each foot; slight drainage on gauze; no foul odor, no greenish exudate; borders/margins are distinct, no tunneling  Neurological: A sensory deficit (dense neuropathy from the knees distally) is present. Coordination normal.  Skin: Skin is warm and dry. No pallor.  Psychiatric: He has a normal mood and affect. His affect is not angry. He is not aggressive, not hyperactive and not withdrawn. He does not express inappropriate judgment. He does not exhibit a depressed mood. He exhibits normal remote memory.  Talkative, but redirectable; good eye contact with examiner; briefly tearful when discussing emotional event from years ago He is attentive.   Diabetic Foot Form - Detailed   Diabetic Foot Exam - detailed  Diabetic Foot exam was performed with the following findings:  Yes 10/28/2014  5:22 PM  Can the patient see the bottom of their feet?:  Yes  Are the shoes appropriate in style and fit?:  Yes (Comment: wearing hard soled DME-type shoes with velcro straps for protection)  Is there swelling or and abnormal foot shape?:  No  Are the toenails long?:  No  Are the toenails thick?:  No  Is there elevated skin temparature?:  No  Are the toenails ingrown?:  No    Pulse Foot Exam completed.:  Yes  Right Dorsalis Pedis:  Diminished Left Dorsalis Pedis:  Diminished  Sensory Foot Exam Completed.:  Yes  Swelling:  No  Semmes-Weinstein Monofilament Test  R Site 1-Great Toe:  Neg L Site 1-Great Toe:  Neg  R Site 4:  Neg L Site 4:  Neg  R Site 5:  Neg L Site 5:  Neg    Comments:  Very dense neuropathy       Results for  orders placed or performed during the hospital encounter of 10/23/14  Comprehensive metabolic panel  Result Value Ref Range   Sodium 137 135 - 145 mmol/L   Potassium 4.1 3.5 - 5.1 mmol/L   Chloride 105 101 - 111 mmol/L   CO2 24 22 - 32 mmol/L   Glucose, Bld 99 65 - 99 mg/dL   BUN 19 6 - 20 mg/dL   Creatinine, Ser 4.09 0.61 - 1.24 mg/dL   Calcium 9.5 8.9 - 81.1 mg/dL   Total Protein 8.5 (H) 6.5 - 8.1 g/dL   Albumin 2.9 (L) 3.5 - 5.0 g/dL   AST 23 15 - 41 U/L   ALT 14 (L) 17 - 63 U/L   Alkaline Phosphatase 73 38 - 126 U/L   Total Bilirubin 0.3 0.3 - 1.2 mg/dL   GFR calc non Af Amer >60 >60 mL/min   GFR calc Af Amer >60 >60 mL/min   Anion gap 8 5 - 15  CBC WITH DIFFERENTIAL  Result Value Ref Range   WBC 10.8 (H) 3.8 - 10.6 K/uL   RBC 3.69 (L) 4.40 - 5.90 MIL/uL   Hemoglobin 11.0 (L) 13.0 - 18.0 g/dL   HCT 91.4 (L) 78.2 - 95.6 %   MCV 89.2 80.0 - 100.0 fL   MCH 29.8 26.0 - 34.0 pg   MCHC 33.4 32.0 - 36.0 g/dL   RDW 21.3 08.6 - 57.8 %   Platelets 418 150 - 440 K/uL   Neutrophils Relative % 74 %   Neutro Abs 8.1 (H) 1.4 - 6.5 K/uL   Lymphocytes Relative 14 %   Lymphs Abs 1.5 1.0 - 3.6 K/uL   Monocytes Relative 8 %   Monocytes Absolute 0.9 0.2 - 1.0 K/uL   Eosinophils Relative 3 %   Eosinophils Absolute 0.3 0 - 0.7 K/uL   Basophils Relative 1 %   Basophils Absolute 0.1 0 - 0.1 K/uL      Assessment & Plan:   Problem List Items Addressed This Visit      Digestive   Vitamin B12 deficiency    Continue supplementation      Relevant Orders   Vitamin B12     Endocrine   Diabetic foot ulcer associated with type 2 diabetes mellitus - Primary    Continue current care, TMP/SMX DS BID; wound care with Santyl collagenase, covering with gauze; will get home health involved for dressings and patient advocacy      Relevant Orders   Hemoglobin A1C   Lipid Panel w/o Chol/HDL Ratio   Microalbumin / creatinine urine ratio     Nervous and Auditory   Neuropathy of lower extremity     Most recent labs showed elevated total protein with low albumin; I am still not convinced that his neuropathy is from diabetes; I am requesting records from outside sources and would like to  talk to his daughter(s); he agrees; I would like to check for other causes, get UPEP and SPEP, consider heavy metal testing, etc      Relevant Orders   Protein Electrophoresis, Urine Rflx.   Protein electrophoresis, serum   Vitamin B12   Heavy metals screen, urine     Other   Literacy problem    Instructions discussed verbally rather than typed out for him      History of osteomyelitis    Recent xray did not show any evidence of osteomyelitis      Hyperproteinemia    Check UPEP and SPEP      Relevant Orders   Protein Electrophoresis, Urine Rflx.   Protein electrophoresis, serum   Hypoalbuminemia    Likely due to nutritional factors; check urine microalbumin at f/u; our lab was closed here when he came to the office      Relevant Orders   Microalbumin / creatinine urine ratio      Follow up plan: Return in about 11 days (around 11/08/2014) for keep next appt.  Orders Placed This Encounter  Procedures  . Protein Electrophoresis, Urine Rflx.  . Protein electrophoresis, serum  . Vitamin B12  . Heavy metals screen, urine  . Hemoglobin A1C  . Lipid Panel w/o Chol/HDL Ratio  . Microalbumin / creatinine urine ratio

## 2014-10-28 NOTE — Telephone Encounter (Signed)
Pt came in and stated that he wasn't going back to the wound care center. Pt stated that he would either punch "that man" in the face or slit his throat.

## 2014-11-01 ENCOUNTER — Encounter: Payer: Medicare PPO | Admitting: Surgery

## 2014-11-01 DIAGNOSIS — L97513 Non-pressure chronic ulcer of other part of right foot with necrosis of muscle: Secondary | ICD-10-CM | POA: Diagnosis not present

## 2014-11-02 DIAGNOSIS — E8809 Other disorders of plasma-protein metabolism, not elsewhere classified: Secondary | ICD-10-CM | POA: Insufficient documentation

## 2014-11-02 NOTE — Assessment & Plan Note (Signed)
Recent xray did not show any evidence of osteomyelitis

## 2014-11-02 NOTE — Assessment & Plan Note (Signed)
Instructions discussed verbally rather than typed out for him

## 2014-11-02 NOTE — Assessment & Plan Note (Signed)
Most recent labs showed elevated total protein with low albumin; I am still not convinced that his neuropathy is from diabetes; I am requesting records from outside sources and would like to talk to his daughter(s); he agrees; I would like to check for other causes, get UPEP and SPEP, consider heavy metal testing, etc

## 2014-11-02 NOTE — Progress Notes (Signed)
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 the patient's daughter was at the bedside says she will help him with his appointments and getting his blood sugar under control by talking to his PCP. We will follow-up with him next week. Procedures MYLZ, YUAN ([id number]) Wound #2 Wound #2 is a Diabetic Wound/Ulcer of the Lower Extremity located on the Right,Plantar Metatarsal head first . There was a Skin/Subcutaneous Tissue/Muscle Debridement ([id number])  debridement with total area of 11.1 sq cm performed by , Jackson Latino., MD. with the following instrument(s): Forceps and Scissors to remove Non-Viable tissue/material including Exudate, Fat, Fibrin/Slough, Muscle, Skin, Callus, and Subcutaneous after achieving pain control using Lidocaine 2% Topical Liquid. A time out was conducted prior to the start of the procedure. A Moderate amount of bleeding was controlled with Silver Nitrate. The procedure was tolerated well with a pain  level of Insensate throughout and a pain level of Insensate following the procedure. Post Debridement Measurements: 5cm length x 5cm width x 1cm depth; 19.635cm^3 volume. Post procedure Diagnosis Wound #2: Same as Pre-Procedure Wound #3 Wound #3 is a Diabetic Wound/Ulcer of the Lower Extremity located on the Right,Plantar Metatarsal head fifth . There was a Skin/Subcutaneous Tissue Debridement ([id number]) debridement with total area of 3.23 sq cm performed by , Jackson Latino., MD. with the following instrument(s): Forceps and Scissors to remove Non-Viable tissue/material including Exudate, Fibrin/Slough, Callus, and Subcutaneous after achieving pain control using Lidocaine 2% Topical Liquid. A time out was conducted prior to the start of the procedure. A Moderate amount of bleeding was controlled with Silver Nitrate. The procedure was tolerated well with a pain level of Insensate throughout and a pain level of Insensate following the procedure. Post Debridement Measurements: 2cm length x 2cm width x 0.3cm depth; 0.942cm^3 volume. Post procedure Diagnosis Wound #3: Same as Pre-Procedure Plan Wound Cleansing: Wound #1 Left,Plantar Foot: Clean wound with Normal Saline. Wound #2 Right,Plantar Metatarsal head first: Clean wound with Normal Saline. Wound #3 Right,Plantar Metatarsal head fifth: Clean wound with Normal Saline. Anesthetic: Wound #1 Left,Plantar Foot: Topical Lidocaine 4% cream applied to wound bed prior to debridement Wound #2 Right,Plantar Metatarsal head first: Topical Lidocaine 4% cream applied to wound bed prior to debridement Wound #3 Right,Plantar Metatarsal head fifth: Topical Lidocaine 4% cream applied to wound bed prior to debridement Primary Wound Dressing: Wound #1 Left,Plantar Foot: Aquacel Ag Wound #2 Right,Plantar Metatarsal head first: [name], [name] ([id number]) Aquacel Ag Wound #3 Right,Plantar Metatarsal head fifth: Aquacel Ag Secondary  Dressing: Wound #1 Left,Plantar Foot: ABD and Kerlix/Conform Wound #2 Right,Plantar Metatarsal head first: ABD and Kerlix/Conform Wound #3 Right,Plantar Metatarsal head fifth: ABD and Kerlix/Conform Dressing Change Frequency: Wound #1 Left,Plantar Foot: Change dressing every day. Wound #2 Right,Plantar Metatarsal head first: Change dressing every day. Wound #3 Right,Plantar Metatarsal head fifth: Change dressing every day. Follow-up Appointments: Wound #1 Left,Plantar Foot: Return Appointment in 1 week. Wound #2 Right,Plantar Metatarsal head first: Return Appointment in 1 week. Wound #3 Right,Plantar Metatarsal head fifth: Return Appointment in 1 week. Off-Loading: Wound #1 Left,Plantar Foot: Open toe surgical shoe with peg assist. Wound #2 Right,Plantar Metatarsal head first: Open toe surgical shoe with peg assist. Wound #3 Right,Plantar Metatarsal head fifth: Open toe surgical shoe with peg assist. Additional Orders / Instructions: Wound #1 Left,Plantar Foot: Increase protein intake. Wound #2 Right,Plantar Metatarsal head first: Increase protein intake. Wound #3 Right,Plantar Metatarsal head fifth: Increase protein intake. Home Health: Wound #1 Left,Plantar Foot: [place] [facility]its - [place] Nurse may visit PRN to address patient s wound care needs. FACE TO FACE ENCOUNTER: MEDICARE and MEDICAID PATIENTS: I certify that this patient is under my care and that I had a face-to-face encounter that meets the physician face-to-face encounter requirements with this patient on this date. The encounter with the patient was in whole or in part for the following MEDICAL CONDITION: (primary reason for [place]) MEDICAL NECESSITY: I certify,  that based on my findings, NURSING services are a medically necessary home health service. HOME BOUND STATUS: I certify that my clinical findings support that this patient is homebound (i.e., Due  to illness or injury, pt requires aid of supportive devices such as crutches, cane, wheelchairs, walkers, the use of special transportation or the assistance of another person to leave their place of residence. There is a normal inability to leave the home and doing so requires considerable and taxing effort. Other absences are HIRAN, LEARD ([id number]) for medical reasons / religious services and are infrequent or of short duration when for other reasons). If current dressing causes regression in wound condition, may D/C ordered dressing product/s and apply Normal Saline Moist Dressing daily until next [place] / Other MD appointment. [place] of regression in wound condition at [phone]. Please direct any NON-WOUND related issues/requests for orders to patient's Primary Care Physician Wound #2 Right,Plantar Metatarsal head first: [place] for [place] Nurse may visit PRN to address patient s wound care needs. FACE TO FACE ENCOUNTER: MEDICARE and MEDICAID PATIENTS: I certify that this patient is under my care and that I had a face-to-face encounter that meets the physician face-to-face encounter requirements with this patient on this date. The encounter with the patient was in whole or in part for the following MEDICAL CONDITION: (primary reason for [place]) MEDICAL NECESSITY: I certify, that based on my findings, NURSING services are a medically necessary home health service. HOME BOUND STATUS: I certify that my clinical findings support that this patient is homebound (i.e., Due to illness or injury, pt requires aid of supportive devices such as crutches, cane, wheelchairs, walkers, the use of special transportation or the assistance of another person to leave their place of residence. There is a normal inability to leave the home and doing so requires considerable and taxing effort. Other absences are for medical  reasons / religious services and are infrequent or of short duration when for other reasons). If current dressing causes regression in wound condition, may D/C ordered dressing product/s and apply Normal Saline Moist Dressing daily until next [place] / Other MD appointment. [place] of regression in wound condition at [phone]. Please direct any NON-WOUND related issues/requests for orders to patient's Primary Care Physician Wound #3 Right,Plantar Metatarsal head fifth: [place] for [place] Nurse may visit PRN to address patient s wound care needs. FACE TO FACE ENCOUNTER: MEDICARE and MEDICAID PATIENTS: I certify that this patient is under my care and that I had a face-to-face encounter that meets the physician face-to-face encounter requirements with this patient on this date. The encounter with the patient was in whole or in part for the following MEDICAL CONDITION: (primary reason for [place]) MEDICAL NECESSITY: I certify, that based on my findings, NURSING services are a medically necessary home health service. HOME BOUND STATUS: I certify that my clinical findings support that this patient is homebound (i.e., Due to illness or injury, pt requires aid of supportive devices such as crutches, cane, wheelchairs, walkers, the use of special transportation or the assistance of another person to leave their place of residence. There is a normal inability to leave the home and doing so requires considerable and taxing effort. Other absences are for medical reasons / religious services and are infrequent or of short duration when for other reasons). If current dressing causes regression in wound condition, may D/C ordered  dressing product/s and apply Normal Saline Moist Dressing daily until next [place] / Other MD appointment. [place] of regression in wound condition at [phone]. Please  direct any NON-WOUND related issues/requests for orders to patient's Primary Care Physician We will continue silver alginate on alternate days, better control of his blood sugar and to see podiatry as soon as possible. the patient's daughter was at the bedside says she will help him with his appointments and getting his blood sugar under control by talking to his PCP. We will follow-up with him next week. OCEAN, SCHILDT ([id number]) Electronic Signature(s) Signed: 11/01/2014 2:29:36 PM By: Christin Fudge MD, FACS Entered By: Christin Fudge on 11/01/2014 14:29:35 [place], TRIMAINE MASER ([id number]) -------------------------------------------------------------------------------- SuperBill Details Patient Name: [name] Date of Service: 11/01/2014 Medical Record Number: [id number] Patient Account Number: [id number] Date of Birth/Sex: 1934/05/09 (80 y.o. 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 Facility Procedures CPT4: Description Modifier Quantity Code [id number] 11042 - DEB SUBQ TISSUE 20 SQ CM/< 16 1 ICD-10 Description Diagnosis E11.621 Type 2 diabetes mellitus with foot ulcer L97.513 Non-pressure chronic ulcer of other part of right foot with necrosis of muscle  L97.523 Non-pressure chronic ulcer of other part of left foot with necrosis of muscle CPT4: [id number] 11043 - DEB MUSC/FASCIA 20 SQ CM/< 1 ICD-10 Description Diagnosis E11.621 Type 2 diabetes mellitus with foot ulcer L97.513 Non-pressure chronic ulcer of other part of right foot with necrosis of muscle L97.523  Non-pressure chronic ulcer of  other part of left foot with necrosis of muscle Physician Procedures CPT4: Description Modifier Quantity Code [id number] 02409 - WC PHYS SUBQ TISS 20 SQ CM 59 1 ICD-10 Description Diagnosis E11.621 Type 2 diabetes mellitus with foot ulcer L97.513 Non-pressure chronic ulcer of other part of right foot with necrosis of muscle  L97.523 Non-pressure chronic ulcer of other part of left foot with necrosis of muscle JAIRO, BELLEW ([id number]) Electronic Signature(s) Signed: 11/01/2014 2:30:05 PM By: Christin Fudge MD, FACS Entered By: Christin Fudge on 11/01/2014 14:30:05

## 2014-11-02 NOTE — Assessment & Plan Note (Signed)
Check UPEP and SPEP 
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
## 2014-11-02 NOTE — Assessment & Plan Note (Signed)
Likely due to nutritional factors; check urine microalbumin at f/u; our lab was closed here when he came to the office

## 2014-11-02 NOTE — Assessment & Plan Note (Signed)
Continue supplementation  ?

## 2014-11-05 ENCOUNTER — Encounter: Payer: Self-pay | Admitting: *Deleted

## 2014-11-05 ENCOUNTER — Emergency Department
Admission: EM | Admit: 2014-11-05 | Discharge: 2014-11-05 | Disposition: A | Discharge status: Home / Self Care | Payer: Medicare PPO | Attending: Emergency Medicine | Admitting: Emergency Medicine

## 2014-11-05 DIAGNOSIS — E11621 Type 2 diabetes mellitus with foot ulcer: Secondary | ICD-10-CM | POA: Insufficient documentation

## 2014-11-05 DIAGNOSIS — Z79899 Other long term (current) drug therapy: Secondary | ICD-10-CM | POA: Diagnosis not present

## 2014-11-05 DIAGNOSIS — Z87891 Personal history of nicotine dependence: Secondary | ICD-10-CM | POA: Diagnosis not present

## 2014-11-05 DIAGNOSIS — L97529 Non-pressure chronic ulcer of other part of left foot with unspecified severity: Secondary | ICD-10-CM

## 2014-11-05 DIAGNOSIS — L089 Local infection of the skin and subcutaneous tissue, unspecified: Secondary | ICD-10-CM | POA: Diagnosis present

## 2014-11-05 DIAGNOSIS — E118 Type 2 diabetes mellitus with unspecified complications: Secondary | ICD-10-CM | POA: Diagnosis not present

## 2014-11-05 DIAGNOSIS — L97519 Non-pressure chronic ulcer of other part of right foot with unspecified severity: Secondary | ICD-10-CM

## 2014-11-05 DIAGNOSIS — I10 Essential (primary) hypertension: Secondary | ICD-10-CM | POA: Diagnosis not present

## 2014-11-05 DIAGNOSIS — L97521 Non-pressure chronic ulcer of other part of left foot limited to breakdown of skin: Secondary | ICD-10-CM | POA: Diagnosis not present

## 2014-11-05 DIAGNOSIS — L97511 Non-pressure chronic ulcer of other part of right foot limited to breakdown of skin: Secondary | ICD-10-CM | POA: Diagnosis not present

## 2014-11-05 LAB — CBC
HEMATOCRIT: 36.3 % — AB (ref 40.0–52.0)
HEMOGLOBIN: 12.3 g/dL — AB (ref 13.0–18.0)
MCH: 30.2 pg (ref 26.0–34.0)
MCHC: 33.8 g/dL (ref 32.0–36.0)
MCV: 89.2 fL (ref 80.0–100.0)
Platelets: 497 10*3/uL — ABNORMAL HIGH (ref 150–440)
RBC: 4.07 MIL/uL — ABNORMAL LOW (ref 4.40–5.90)
RDW: 14.4 % (ref 11.5–14.5)
WBC: 9.2 10*3/uL (ref 3.8–10.6)

## 2014-11-05 NOTE — Discharge Instructions (Signed)
Skin Ulcer  A skin ulcer is an open sore that can be shallow or deep. Skin ulcers sometimes become infected and are difficult to treat. It may be 1 month or longer before real healing progress is made.  CAUSES    Injury.   Problems with the veins or arteries.   Diabetes.   Insect bites.   Bedsores.   Inflammatory conditions.  SYMPTOMS    Pain, redness, swelling, and tenderness around the ulcer.   Fever.   Bleeding from the ulcer.   Yellow or clear fluid coming from the ulcer.  DIAGNOSIS   There are many types of skin ulcers. Any open sores will be examined. Certain tests will be done to determine the kind of ulcer you have. The right treatment depends on the type of ulcer you have.  TREATMENT   Treatment is a long-term challenge. It may include:   Wearing an elastic wrap, compression stockings, or gel cast over the ulcer area.   Taking antibiotic medicines or putting antibiotic creams on the affected area if there is an infection.  HOME CARE INSTRUCTIONS   Put on your bandages (dressings), wraps, or casts over the ulcer as directed by your caregiver.   Change all dressings as directed by your caregiver.   Take all medicines as directed by your caregiver.   Keep the affected area clean and dry.   Avoid injuries to the affected area.   Eat a well-balanced, healthy diet that includes plenty of fruit and vegetables.   If you smoke, consider quitting or decreasing the amount of cigarettes you smoke.   Once the ulcer heals, get regular exercise as directed by your caregiver.   Work with your caregiver to make sure your blood pressure, cholesterol, and diabetes are well-controlled.   Keep your skin moisturized. Dry skin can crack and lead to skin ulcers.  SEEK IMMEDIATE MEDICAL CARE IF:    Your pain gets worse.   You have swelling, redness, or fluids around the ulcer.   You have chills.   You have a fever.  MAKE SURE YOU:    Understand these instructions.   Will watch your condition.   Will get  help right away if you are not doing well or get worse.  Document Released: 03/11/2004 Document Revised: 04/26/2011 Document Reviewed: 09/18/2010  [employer] Patient Information 2015 [employer]. This information is not intended to replace advice given to you by your health care provider. Make sure you discuss any questions you have with your health care provider.

## 2014-11-05 NOTE — ED Notes (Signed)
[facility] 131-notified by [facility] ED [facility]

## 2014-11-05 NOTE — ED Provider Notes (Signed)
[facility] Emergency Department Joanthony Hamza Note  ____________________________________________  Time seen: Approximately 145 PM  I have reviewed the triage vital signs and the nursing notes.   HISTORY  Chief Complaint Wound Infection    HPI [name] is a 80 y.o. male with a history of chronic wounds to the bilateral plantar surfaces of his feet presents today because he would like to see Dr. Graciela Husbands, who he says takes care of his chronic healing foot wound.The patient has an appointment at the wound care clinic tomorrow but he says he would like to be seen today by his wound care doctor. He denies any fever at home. Was brought in earlier by ambulance after a disagreement with his home nurse over medications. Per nursing here the police also had be contacted because of this disagreement/altercation.   Past Medical History  Diagnosis Date  . COPD (chronic obstructive pulmonary disease)   . Diabetes mellitus without complication   . History of blood clots   . GERD (gastroesophageal reflux disease)   . Hyperlipidemia   . Hypertension   . CAD (coronary artery disease)   . Osteomyelitis   . Arthritis   . PN (peripheral neuropathy)     Patient Active Problem List   Diagnosis Date Noted  . Hyperproteinemia 11/02/2014  . Hypoalbuminemia 11/02/2014  . Arthritis 10/23/2014  . Arteriosclerosis of coronary artery 10/23/2014  . Diabetic foot ulcer associated with type 2 diabetes mellitus 10/23/2014  . Amputation of finger of left hand 10/23/2014  . History of pulmonary embolism 10/23/2014  . Literacy problem 10/23/2014  . Umbilical hernia without obstruction or gangrene 10/23/2014  . Vitamin B12 deficiency 10/23/2014  . Neuropathy of lower extremity 10/23/2014  . History of osteomyelitis 10/23/2014  . Complication of diabetes mellitus 03/27/2014  . Artificial cardiac pacemaker 08/10/2013  . H/O coronary artery bypass surgery 08/10/2013  . Benign  prostatic hyperplasia with urinary obstruction 09/27/2012  . Abnormal prostate specific antigen 09/27/2012    Past Surgical History  Procedure Laterality Date  . Pacemaker insertion    . Appendectomy    . Coronary artery bypass graft  2002  . Clavicle surgery  2012    open reduction and internal fixation of left clavicle  . Amputation 4th finger Left     Current Outpatient Rx  Name  Route  Sig  Dispense  Refill  . atorvastatin (LIPITOR) 40 MG tablet   Oral   Take 40 mg by mouth at bedtime.         . Cyanocobalamin (VITAMIN B-12) 5000 MCG SUBL   Sublingual   Place 5,000 mcg under the tongue daily.         [name] gabapentin (NEURONTIN) 300 MG capsule   Oral   Take 300 mg by mouth 3 (three) times daily.         . metFORMIN (GLUCOPHAGE-XR) 500 MG 24 hr tablet   Oral   Take 500 mg by mouth daily at 12 noon.         [name] omeprazole (PRILOSEC) 40 MG capsule   Oral   Take 40 mg by mouth daily.         [name] OVER THE COUNTER MEDICATION   Oral   Take 2 capsules by mouth daily. Pt takes OmegaXL.         . ramipril (ALTACE) 5 MG capsule   Oral   Take 5 mg by mouth daily.         [name] sulfamethoxazole-trimethoprim (BACTRIM DS,SEPTRA  DS) 800-160 MG per tablet   Oral   Take 1 tablet by mouth 2 (two) times daily. Patient not taking: Reported on 11/05/2014   20 tablet   0     Allergies Percocet  Family History  Problem Relation Age of Onset  . Cancer Mother     spine  . Hypertension Mother   . Mental illness Mother   . Cancer Father     lung  . Asthma Father   . Allergic rhinitis Father   . Arthritis Father     Social History Social History  Substance Use Topics  . Smoking status: Former Smoker -- 0.25 packs/day    Types: Cigarettes, Cigars    Quit date: 10/18/1979  . Smokeless tobacco: Never Used  . Alcohol Use: No    Review of Systems Constitutional: No fever/chills Eyes: No visual changes. ENT: No sore throat. Cardiovascular: Denies chest  pain. Respiratory: Denies shortness of breath. Gastrointestinal: No abdominal pain.  No nausea, no vomiting.  No diarrhea.  No constipation. Genitourinary: Negative for dysuria. Musculoskeletal: Negative for back pain. Skin: Chronic wounds to the plantar surface of his feet bilaterally.  Neurological: Negative for headaches, focal weakness or numbness.  10-point ROS otherwise negative.  ____________________________________________   PHYSICAL EXAM:  VITAL SIGNS: ED Triage Vitals  Enc Vitals Group     BP 11/05/14 1208 120/67 mmHg     Pulse Rate 11/05/14 1208 62     Resp 11/05/14 1208 16     Temp 11/05/14 1208 97.8 F (36.6 C)     Temp Source 11/05/14 1208 Oral     SpO2 11/05/14 1208 100 %     Weight 11/05/14 1208 216 lb (97.977 kg)     Height 11/05/14 1208  (1.753 m)     Head Cir --      Peak Flow --      Pain Score 11/05/14 1209 0     Pain Loc --      Pain Edu? --      Excl. in GC? --     Constitutional: Alert and oriented. Well appearing and in no acute distress. Patient is able to ambulate with his specialty footwear with a normal gait. Eyes: Conjunctivae are normal. PERRL. EOMI. Head: Atraumatic. Nose: No congestion/rhinnorhea. Mouth/Throat: Mucous membranes are moist.  Oropharynx non-erythematous. Neck: No stridor.   Cardiovascular: Normal rate, regular rhythm. Grossly normal heart sounds.  Good peripheral circulation. Respiratory: Normal respiratory effort.  No retractions. Lungs CTAB. Gastrointestinal: Soft and nontender. No distention. No abdominal bruits. No CVA tenderness. Musculoskeletal: Mild bilateral lower extremity edema. 3 wounds to the plantar surfaces of the feet. There are 2 wounds to the plantar surface of the right foot. The first is roughly 3 cm in diameter to the tendinous layer at the metatarsal phalangeal joint of the great toe. There is no pus. It is dry and without any surrounding induration pus or tenderness. There is also lateral to this was  much more superficial about 2 x 4 cm and without any pus, induration, heat or tenderness. Left plantar surface with 2 x 4 cm well-healing anterior wound. No tenderness, pus. Neurologic:  Normal speech and language. No gross focal neurologic deficits are appreciated. No gait instability. Skin:  Skin is warm, dry and intact. No rash noted. Psychiatric: Mood and affect are normal. Speech and behavior are normal.  ____________________________________________   LABS (all labs ordered are listed, but only abnormal results are displayed)  Labs Reviewed  CBC -  Abnormal; Notable for the following:    RBC 4.07 (*)    Hemoglobin 12.3 (*)    HCT 36.3 (*)    Platelets 497 (*)    All other components within normal limits  CULTURE, BLOOD (ROUTINE X 2)  CBG MONITORING, ED   ____________________________________________  EKG   ____________________________________________  RADIOLOGY   ____________________________________________   PROCEDURES   ____________________________________________   INITIAL IMPRESSION / ASSESSMENT AND PLAN / ED COURSE  Pertinent labs & imaging results that were available during my care of the patient were reviewed by me and considered in my medical decision making (see chart for details).  ----------------------------------------- 2:28 PM on 11/05/2014 -----------------------------------------  Patient is nontoxic appearing with a normal white count and blood sugar in the 130s. He has follow-up with wound care clinic tomorrow. He also just finished a course of antibiotics. I see no reason at this time to bring him into the hospital or do further workup for any infection as his feet appear to be healing well. I believe the best thing for him is to be seen in his follow-up wound care appointment tomorrow. I explained this to him. The patient says he will be able to follow-up at his scheduled appointment  tomorrow. ____________________________________________   FINAL CLINICAL IMPRESSION(S) / ED DIAGNOSES  Well-healing chronic wounds to the bilateral plantar surfaces of the feet. Return visit.    Myrna Blazer, MD 11/05/14 [phone]

## 2014-11-05 NOTE — ED Notes (Signed)
Pt to ED via EMS from home due to bilateral bottom feet wounds. Pt known diabetic, sent in by home health nurse this am after an argument was started over medications. Pt has large wounds on bottom of both feet, healing well, pink in color, slight yellow drainage noted. Pt denies any pain at this time, ambulatory to room. Pt vitals wnl at this time.

## 2014-11-06 LAB — GLUCOSE, CAPILLARY: Glucose-Capillary: 131 mg/dL — ABNORMAL HIGH (ref 65–99)

## 2014-11-08 ENCOUNTER — Encounter: Payer: Medicare PPO | Admitting: Surgery

## 2014-11-08 ENCOUNTER — Ambulatory Visit: Payer: Medicare PPO | Admitting: Family Medicine

## 2014-11-08 DIAGNOSIS — L97513 Non-pressure chronic ulcer of other part of right foot with necrosis of muscle: Secondary | ICD-10-CM | POA: Diagnosis not present

## 2014-11-09 NOTE — Progress Notes (Addendum)
JORDELL, OUTTEN ([id number]) Visit Report for 11/08/2014 Arrival Information Details Patient Name: [name], [name] Date of Service: 11/08/2014 8:00 AM Medical Record Number: [id number] Patient Account Number: [id number] Date of Birth/Sex: 26-Jul-1934 (80 y.o. Male) Treating RN: Curtis Sites Primary Care Physician: [name], MELINDA Other Clinician: Referring Physician: [name], MELINDA Treating Physician/Extender: Rudene Re in Treatment: 2 Visit Information History Since Last Visit Added or deleted any medications: No Patient Arrived: Ambulatory Any new allergies or adverse reactions: No Arrival Time: 08:11 Had a fall or experienced change in No Accompanied By: self activities of daily living that may affect Transfer Assistance: None risk of falls: Patient Identification Verified: Yes Signs or symptoms of abuse/neglect since last No Secondary Verification Process Yes visito Completed: Hospitalized since last visit: No Patient Has Alerts: Yes Pain Present Now: No Patient Alerts: DMII ABI Dixon Bilateral Electronic Signature(s) Signed: 11/08/2014 5:11:49 PM By: Curtis Sites Entered By: Curtis Sites on 11/08/2014 08:12:18 Mcadams, [name] ([id number]) -------------------------------------------------------------------------------- Clinic Level of Care Assessment Details Patient Name: [name] Date of Service: 11/08/2014 8:00 AM Medical Record Number: [id number] Patient Account Number: [id number] Date of Birth/Sex: Jul 30, 1934 (80 y.o. Male) Treating RN: Huel Coventry Primary Care Physician: [name], MELINDA Other Clinician: Referring Physician: [name], MELINDA Treating Physician/Extender: Rudene Re in Treatment: 2 Clinic Level of Care Assessment Items TOOL 4 Quantity Score  - Use when only an EandM is performed on FOLLOW-UP visit 0 ASSESSMENTS - Nursing Assessment / Reassessment  - Reassessment of Co-morbidities (includes updates in patient status)  0 X - Reassessment of Adherence to Treatment Plan 1 5 ASSESSMENTS - Wound and Skin Assessment / Reassessment  - Simple Wound Assessment / Reassessment - one wound 0 X - Complex Wound Assessment / Reassessment - multiple wounds 3 5  - Dermatologic / Skin Assessment (not related to wound area) 0 ASSESSMENTS - Focused Assessment  - Circumferential Edema Measurements - multi extremities 0  - Nutritional Assessment / Counseling / Intervention 0  - Lower Extremity Assessment (monofilament, tuning fork, pulses) 0  - Peripheral Arterial Disease Assessment (using hand held doppler) 0 ASSESSMENTS - Ostomy and/or Continence Assessment and Care  - Incontinence Assessment and Management 0  - Ostomy Care Assessment and Management (repouching, etc.) 0 PROCESS - Coordination of Care X - Simple Patient / Family Education for ongoing care 1 15  - Complex (extensive) Patient / Family Education for ongoing care 0 X - Staff obtains Chiropractor, Records, Test Results / Process Orders 1 10  - Staff telephones HHA, Nursing Homes / Clarify orders / etc 0  - Routine Transfer to another Facility (non-emergent condition) 0 [name], Kenneth RENTERIA. ([id number])  - Routine Hospital Admission (non-emergent condition) 0  - New Admissions / Manufacturing engineer / Ordering NPWT, Apligraf, etc. 0  - Emergency Hospital Admission (emergent condition) 0 X - Simple Discharge Coordination 1 10  - Complex (extensive) Discharge Coordination 0 PROCESS - Special Needs  - Pediatric / Minor Patient Management 0  - Isolation Patient Management 0  - Hearing / Language / Visual special needs 0  - Assessment of Community assistance (transportation, D/C planning, etc.) 0  - Additional assistance / Altered mentation 0  - Support Surface(s) Assessment (bed, cushion, seat, etc.) 0 INTERVENTIONS - Wound Cleansing / Measurement  - Simple Wound Cleansing - one wound 0 X - Complex Wound Cleansing -  multiple wounds 3 5  - Wound Imaging (photographs - any number of wounds) 0 X - Wound Tracing (instead of photographs) 1 5  -  Simple Wound Measurement - one wound 0 X - Complex Wound Measurement - multiple wounds 3 5 INTERVENTIONS - Wound Dressings []  - Small Wound Dressing one or multiple wounds 0 X - Medium Wound Dressing one or multiple wounds 3 15 []  - Large Wound Dressing one or multiple wounds 0 []  - Application of Medications - topical 0 []  - Application of Medications - injection 0 INTERVENTIONS - Miscellaneous []  - External ear exam 0 Thumma, Geran E. ([id number]) []  - Specimen Collection (cultures, biopsies, blood, body fluids, etc.) 0 []  - Specimen(s) / Culture(s) sent or taken to Lab for analysis 0 []  - Patient Transfer (multiple staff / Michiel Sites Lift / Similar devices) 0 []  - Simple Staple / Suture removal (25 or less) 0 []  - Complex Staple / Suture removal (26 or more) 0 []  - Hypo / Hyperglycemic Management (close monitor of Blood Glucose) 0 []  - Ankle / Brachial Index (ABI) - do not check if billed separately 0 X - Vital Signs 1 5 Has the patient been seen at the hospital within the last three years: Yes Total Score: 140 Level Of Care: New/Established - Level 4 Electronic Signature(s) Signed: 11/08/2014 5:14:03 PM By: Elliot Gurney, RN, BSN, Kim RN, BSN Entered By: Elliot Gurney, RN, BSN, Kim on 11/08/2014 08:39:38 Cieslak, [name] ([id number]) -------------------------------------------------------------------------------- Encounter Discharge Information Details Patient Name: [name] Date of Service: 11/08/2014 8:00 AM Medical Record Number: [id number] Patient Account Number: [id number] Date of Birth/Sex: Dec 03, 1934 (80 y.o. Male) Treating RN: Curtis Sites Primary Care Physician: [name], MELINDA Other Clinician: Referring Physician: [name], MELINDA Treating Physician/Extender: Rudene Re in Treatment: 2 Encounter Discharge Information Items Discharge Pain  Level: 0 Discharge Condition: Stable Ambulatory Status: Ambulatory Discharge Destination: Home Transportation: Private Auto Accompanied By: self Schedule Follow-up Appointment: Yes Medication Reconciliation completed and provided to Patient/Care No Provider: Provided on Clinical Summary of Care: 11/08/2014 Form Type Recipient Paper Patient AF Electronic Signature(s) Signed: 11/08/2014 8:51:32 AM By: Gwenlyn Perking Entered By: Gwenlyn Perking on 11/08/2014 08:51:32 [name], [name] ([id number]) -------------------------------------------------------------------------------- Lower Extremity Assessment Details Patient Name: [name] Date of Service: 11/08/2014 8:00 AM Medical Record Number: [id number] Patient Account Number: [id number] Date of Birth/Sex: 03-Mar-1934 (80 y.o. Male) Treating RN: Curtis Sites Primary Care Physician: [name], MELINDA Other Clinician: Referring Physician: [name], MELINDA Treating Physician/Extender: Rudene Re in Treatment: 2 Vascular Assessment Pulses: Posterior Tibial Dorsalis Pedis Palpable: [Left:Yes] [Right:Yes] Extremity colors, hair growth, and conditions: Extremity Color: [Left:Dusky] [Right:Dusky] Hair Growth on Extremity: [Left:Yes] [Right:Yes] Temperature of Extremity: [Left:Warm] [Right:Warm] Capillary Refill: [Left:< 3 seconds] [Right:< 3 seconds] Toe Nail Assessment Left: Right: Thick: Yes Yes Discolored: Yes Yes Deformed: No No Improper Length and Hygiene: Yes Yes Electronic Signature(s) Signed: 11/08/2014 5:11:49 PM By: Curtis Sites Entered By: Curtis Sites on 11/08/2014 08:22:17 Neuner, [name] ([id number]) -------------------------------------------------------------------------------- Multi Wound Chart Details Patient Name: [name] Date of Service: 11/08/2014 8:00 AM Medical Record Number: [id number] Patient Account Number: [id number] Date of Birth/Sex: 1934-09-15 (80 y.o. 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] [2:the Lower Extremity] [3:the Lower Extremity] Comorbid History: [1:Chronic Obstructive Pulmonary Disease (COPD), Coronary Artery (COPD), Coronary Artery (COPD), Coronary Artery Disease, Hypertension, Type II Diabetes, Neuropathy] [2:Chronic Obstructive Pulmonary Disease Disease, Hypertension, Type  II Diabetes, Neuropathy] [3:Chronic Obstructive Pulmonary Disease Disease, Hypertension, Type II Diabetes, Neuropathy] Date Acquired: [1:10/04/2014] [2:10/04/2014] [3:10/04/2014] Weeks of Treatment: [1:2] [2:2] [3:2] Wound Status: [1:Open] [2:Open] [3:Open] Measurements L x W x D 3.4x4.7x0.2 [2:4x3.6x0.8] [3:2.1x2.1x0.3] (cm) Area (cm) : [1:12.551] [2:11.31] [3:3.464] Volume (cm) : [1:2.51] [2:9.048] [3:1.039] % Reduction in Area: [1:29.40%] [2:0.00%] [3:-5.00%] % Reduction in Volume: -41.20% [2:-700.00%] [3:-4.90%] Classification: [1:Grade 1] [2:Grade 1] [3:Grade 1] Exudate Amount: [1:Medium] [2:Medium] [3:Medium] Exudate Type: [1:Serous] [2:Serous] [3:Serous] Exudate Color: [1:amber] [2:amber] [3:amber] Foul Odor After [1:Yes] [2:Yes] [3:Yes] Cleansing: Odor Anticipated Due to No [2:No] [3:No] Product Use: Wound Margin: [1:Distinct, outline attached Distinct, outline attached Distinct, outline  attached] Granulation Amount: Large (67-100%) Medium (34-66%) Large (67-100%) Granulation Quality: Red, Pink Red, Pink Red, Pink Necrotic Amount: Small (1-33%) Medium (34-66%) Small (1-33%) Necrotic Tissue: Eschar, Adherent Slough Eschar N/A Exposed Structures: Fascia: No Fascia: No Fascia: No Fat: No Fat: No Fat: No Tendon: No Tendon: No Tendon: No Muscle: No Muscle: No Muscle: No Joint: No Joint: No Joint: No Bone: No Bone: No Bone: No Limited to Skin Limited to Skin Limited to Skin Breakdown Breakdown Breakdown Epithelialization: Small (1-33%) None Small (1-33%) Periwound Skin Texture: Callus: Yes Callus: Yes Callus: Yes Edema: No Edema: No Edema: No Excoriation: No Excoriation: No Excoriation: No Induration: No Induration: No Induration: No Crepitus: No Crepitus: No Crepitus: No Fluctuance: No Fluctuance: No Fluctuance: No Friable: No Friable: No Friable: No Rash: No Rash: No Rash: No Scarring: No Scarring: No Scarring: No Periwound Skin Moist: Yes Moist: Yes Moist: Yes Moisture: Maceration: No Maceration: No Maceration: No Dry/Scaly: No Dry/Scaly: No Dry/Scaly: No Periwound Skin Color: Atrophie Blanche: No Atrophie Blanche: No Atrophie Blanche: No Cyanosis: No Cyanosis: No Cyanosis: No Ecchymosis: No Ecchymosis: No Ecchymosis: No Erythema: No Erythema: No Erythema: No Hemosiderin Staining: No Hemosiderin Staining: No Hemosiderin Staining: No Mottled: No Mottled: No Mottled: No Pallor: No Pallor: No Pallor: No Rubor: No Rubor: No Rubor: No Temperature: No Abnormality No Abnormality No Abnormality Tenderness on No No No Palpation: Wound Preparation: Ulcer Cleansing: Ulcer Cleansing: Ulcer Cleansing: Rinsed/Irrigated with Rinsed/Irrigated with Rinsed/Irrigated with Saline Saline Saline Topical Anesthetic Topical Anesthetic Topical Anesthetic Applied: Other: lidocaine Applied: Other: lidocaine Applied: Other: lidocaine 4%  4% 4% Treatment Notes Electronic Signature(s) Signed: 11/08/2014 5:14:03 PM By: Elliot Gurney, RN, BSN, Kim RN, BSN Entered By: Elliot Gurney, RN, BSN, Kim on 11/08/2014 08:36:23 [name], [name] ([id number]) [name], [name] ([id number]) -------------------------------------------------------------------------------- Multi-Disciplinary Care Plan Details Patient Name: [name] Date of Service: 11/08/2014 8:00 AM Medical Record Number: [id number] Patient Account Number: [id number] Date of Birth/Sex: 1934-12-23 (80 y.o. Male) Treating RN: Huel Coventry Primary Care Physician: [name], Juliette Alcide Other Clinician: Referring Physician: [name], MELINDA Treating Physician/Extender: Rudene Re in Treatment: 2 Active Inactive Electronic Signature(s) Signed: 11/25/2014 6:16:01 PM By: Elliot Gurney RN, BSN, Kim RN, BSN Previous Signature: 11/08/2014 5:14:03 PM Version By: Elliot Gurney RN, BSN, Kim RN, BSN Entered By: Elliot Gurney, RN, BSN, Kim on 11/19/2014 09:34:37 [name], [name] ([id number]) -------------------------------------------------------------------------------- Patient/Caregiver Education Details Patient Name: [name] Date of Service: 11/08/2014 8:00 AM Medical Record Number: [id number] Patient Account Number: [id number] Date of Birth/Gender: 08-23-1934 (80 y.o. Male) Treating RN: Curtis Sites Primary Care Physician: [name], MELINDA Other Clinician: Referring Physician: [name], MELINDA Treating Physician/Extender: Rudene Re in  Treatment: 2 Education Assessment Education Provided To: Patient Education Topics Provided Wound/Skin Impairment: Handouts: Other: wound care and healing Methods: Demonstration, Explain/Verbal Responses: State content correctly Electronic Signature(s) Signed: 11/08/2014 5:11:49 PM By: Curtis Sites Entered By: Curtis Sites on 11/08/2014 08:49:31 Calip, [name]  ([id number]) -------------------------------------------------------------------------------- Wound Assessment Details Patient Name: [name] Date of Service: 11/08/2014 8:00 AM Medical Record Number: [id number] Patient Account Number: [id number] Date of Birth/Sex: Jul 24, 1934 (80 y.o. Male) Treating RN: Curtis Sites Primary Care Physician: [name], MELINDA Other Clinician: Referring Physician: [name], MELINDA Treating Physician/Extender: Rudene Re in Treatment: 2 Wound Status Wound Number: 1 Primary Diabetic Wound/Ulcer of the Lower Etiology: Extremity Wound Location: Left Foot - Plantar Wound Open Wounding Event: Blister Status: Date Acquired: 10/04/2014 Comorbid Chronic Obstructive Pulmonary Disease Weeks Of Treatment: 2 History: (COPD), Coronary Artery Disease, Clustered Wound: No Hypertension, Type II Diabetes, Neuropathy Photos Photo Uploaded By: Curtis Sites on 11/08/2014 09:09:14 Wound Measurements Length: (cm) 3.4 Width: (cm) 4.7 Depth: (cm) 0.2 Area: (cm) 12.551 Volume: (cm) 2.51 % Reduction in Area: 29.4% % Reduction in Volume: -41.2% Epithelialization: Small (1-33%) Tunneling: No Undermining: No Wound Description Classification: Grade 1 Foul Odor Aft Wound Margin: Distinct, outline attached Due to Produc Exudate Amount: Medium Exudate Type: Serous Exudate Color: amber er Cleansing: Yes t Use: No Wound Bed Granulation Amount: Large (67-100%) Exposed Structure Granulation Quality: Red, Pink Fascia Exposed: No Necrotic Amount: Small (1-33%) Fat Layer Exposed: No [place], Glennon E. ([id number]) Necrotic Quality: Eschar, Adherent Slough Tendon Exposed: No Muscle Exposed: No Joint Exposed: No Bone Exposed: No Limited to Skin Breakdown Periwound Skin Texture Texture Color No Abnormalities Noted: No No Abnormalities Noted: No Callus: Yes Atrophie Blanche: No Crepitus: No Cyanosis: No Excoriation: No Ecchymosis: No Fluctuance:  No Erythema: No Friable: No Hemosiderin Staining: No Induration: No Mottled: No Localized Edema: No Pallor: No Rash: No Rubor: No Scarring: No Temperature / Pain Moisture Temperature: No Abnormality No Abnormalities Noted: No Dry / Scaly: No Maceration: No Moist: Yes Wound Preparation Ulcer Cleansing: Rinsed/Irrigated with Saline Topical Anesthetic Applied: Other: lidocaine 4%, Electronic Signature(s) Signed: 11/08/2014 5:11:49 PM By: Curtis Sites Entered By: Curtis Sites on 11/08/2014 08:25:40 Vonseggern, [name] ([id number]) -------------------------------------------------------------------------------- Wound Assessment Details Patient Name: [name] Date of Service: 11/08/2014 8:00 AM Medical Record Number: [id number] Patient Account Number: [id number] Date of Birth/Sex: 1934/07/09 (80 y.o. Male) Treating RN: Curtis Sites Primary Care Physician: [name], MELINDA Other Clinician: Referring Physician: [name], MELINDA Treating Physician/Extender: Rudene Re in Treatment: 2 Wound Status Wound Number: 2 Primary Diabetic Wound/Ulcer of the Lower Etiology: Extremity Wound Location: Right Metatarsal head first - Plantar Wound Open Status: Wounding Event: Blister Comorbid Chronic Obstructive Pulmonary Disease Date Acquired: 10/04/2014 History: (COPD), Coronary Artery Disease, Weeks Of Treatment: 2 Hypertension, Type II Diabetes, Clustered Wound: No Neuropathy Photos Photo Uploaded By: Curtis Sites on 11/08/2014 09:09:15 Wound Measurements Length: (cm) 4 Width: (cm) 3.6 Depth: (cm) 0.8 Area: (cm) 11.31 Volume: (cm) 9.048 % Reduction in Area: 0% % Reduction in Volume: -700% Epithelialization: None Tunneling: No Undermining: No Wound Description Classification: Grade 1 Foul Odor Aft Wound Margin: Distinct, outline attached Due to Produc Exudate Amount: Medium Exudate Type: Serous Exudate Color: amber er Cleansing: Yes t Use: No Wound  Bed Granulation Amount: Medium (34-66%) Exposed Structure Granulation Quality: Red, Pink Fascia Exposed: No Necrotic Amount: Medium (34-66%) Fat Layer Exposed: No Misener, Anuar E. ([id number]) Necrotic Quality: Eschar Tendon Exposed: No Muscle Exposed: No Joint Exposed: No Bone Exposed: No Limited to Skin Breakdown  Periwound Skin Texture Texture Color No Abnormalities Noted: No No Abnormalities Noted: No Callus: Yes Atrophie Blanche: No Crepitus: No Cyanosis: No Excoriation: No Ecchymosis: No Fluctuance: No Erythema: No Friable: No Hemosiderin Staining: No Induration: No Mottled: No Localized Edema: No Pallor: No Rash: No Rubor: No Scarring: No Temperature / Pain Moisture Temperature: No Abnormality No Abnormalities Noted: No Dry / Scaly: No Maceration: No Moist: Yes Wound Preparation Ulcer Cleansing: Rinsed/Irrigated with Saline Topical Anesthetic Applied: Other: lidocaine 4%, Electronic Signature(s) Signed: 11/08/2014 5:11:49 PM By: Curtis Sites Entered By: Curtis Sites on 11/08/2014 08:26:04 Pita, [name] ([id number]) -------------------------------------------------------------------------------- Wound Assessment Details Patient Name: [name] Date of Service: 11/08/2014 8:00 AM Medical Record Number: [id number] Patient Account Number: [id number] Date of Birth/Sex: [birth year] (80 y.o. Male) Treating RN: Curtis Sites Primary Care Physician: [name], MELINDA Other Clinician: Referring Physician: [name], MELINDA Treating Physician/Extender: Rudene Re in Treatment: 2 Wound Status Wound Number: 3 Primary Diabetic Wound/Ulcer of the Lower Etiology: Extremity Wound Location: Right Metatarsal head fifth - Plantar Wound Open Status: Wounding Event: Blister Comorbid Chronic Obstructive Pulmonary Disease Date Acquired: 10/04/2014 History: (COPD), Coronary Artery Disease, Weeks Of Treatment: 2 Hypertension, Type II  Diabetes, Clustered Wound: No Neuropathy Photos Photo Uploaded By: Curtis Sites on 11/08/2014 09:09:39 Wound Measurements Length: (cm) 2.1 Width: (cm) 2.1 Depth: (cm) 0.3 Area: (cm) 3.464 Volume: (cm) 1.039 % Reduction in Area: -5% % Reduction in Volume: -4.9% Epithelialization: Small (1-33%) Tunneling: No Undermining: No Wound Description Classification: Grade 1 Foul Odor Aft Wound Margin: Distinct, outline attached Due to Produc Exudate Amount: Medium Exudate Type: Serous Exudate Color: amber er Cleansing: Yes t Use: No Wound Bed Granulation Amount: Large (67-100%) Exposed Structure Granulation Quality: Red, Pink Fascia Exposed: No Necrotic Amount: Small (1-33%) Fat Layer Exposed: No Saraceno, Delvecchio E. ([id number]) Tendon Exposed: No Muscle Exposed: No Joint Exposed: No Bone Exposed: No Limited to Skin Breakdown Periwound Skin Texture Texture Color No Abnormalities Noted: No No Abnormalities Noted: No Callus: Yes Atrophie Blanche: No Crepitus: No Cyanosis: No Excoriation: No Ecchymosis: No Fluctuance: No Erythema: No Friable: No Hemosiderin Staining: No Induration: No Mottled: No Localized Edema: No Pallor: No Rash: No Rubor: No Scarring: No Temperature / Pain Moisture Temperature: No Abnormality No Abnormalities Noted: No Dry / Scaly: No Maceration: No Moist: Yes Wound Preparation Ulcer Cleansing: Rinsed/Irrigated with Saline Topical Anesthetic Applied: Other: lidocaine 4%, Electronic Signature(s) Signed: 11/08/2014 5:11:49 PM By: Curtis Sites Entered By: Curtis Sites on 11/08/2014 08:26:19 Clendenning, [name] ([id number]) -------------------------------------------------------------------------------- Vitals Details Patient Name: [name] Date of Service: 11/08/2014 8:00 AM Medical Record Number: [id number] Patient Account Number: [id number] Date of Birth/Sex: February 26, 1934 (80 y.o. Male) Treating RN: Curtis Sites Primary  Care Physician: [name], MELINDA Other Clinician: Referring Physician: [name], MELINDA Treating Physician/Extender: Rudene Re in Treatment: 2 Vital Signs Time Taken: 08:12 Temperature (F): 98.1 Height (in): 70 Pulse (bpm): 78 Weight (lbs): 210 Respiratory Rate (breaths/min): 18 Body Mass Index (BMI): 30.1 Blood Pressure (mmHg): 117/55 Reference Range: 80 - 120 mg / dl Electronic Signature(s) Signed: 11/08/2014 5:11:49 PM By: Curtis Sites Entered By: Curtis Sites on 11/08/2014 08:15:11

## 2014-11-09 NOTE — Progress Notes (Signed)
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

## 2014-11-10 ENCOUNTER — Telehealth: Payer: Self-pay | Admitting: Family Medicine

## 2014-11-10 NOTE — Telephone Encounter (Signed)
Mr. Coldwell has several labs that needs to be drawn; please ask him to have these done ASAP so perhaps we'll have the results for our next appt Also, he is wanting to get set up to live in an assisted living facility in [facility] [place], [place] Please call him to get more information and see if you can get their social worker on the phone for me He has bilateral foot ulcers that require dressing changes and will need more care than he can provide at home, and he'd like to go back to [place]

## 2014-11-11 NOTE — Telephone Encounter (Signed)
Left message for [name] at [facility] to call in regards to getting services for him.

## 2014-11-12 NOTE — Telephone Encounter (Signed)
He does not have [facility]; they have rejected his case (he is not a member); try another channel

## 2014-11-14 ENCOUNTER — Observation Stay
Admit: 2014-11-14 | Discharge: 2014-11-14 | Disposition: A | Discharge status: Home / Self Care | Payer: Medicare PPO | Attending: Internal Medicine | Admitting: Internal Medicine

## 2014-11-14 ENCOUNTER — Observation Stay
Admission: EM | Admit: 2014-11-14 | Discharge: 2014-11-15 | Disposition: A | Discharge status: Home / Self Care | Payer: Medicare PPO | Attending: Internal Medicine | Admitting: Internal Medicine

## 2014-11-14 ENCOUNTER — Emergency Department: Payer: Medicare PPO

## 2014-11-14 ENCOUNTER — Other Ambulatory Visit: Payer: Medicare PPO

## 2014-11-14 ENCOUNTER — Ambulatory Visit (INDEPENDENT_AMBULATORY_CARE_PROVIDER_SITE_OTHER): Payer: Medicare PPO | Admitting: Family Medicine

## 2014-11-14 VITALS — BP 125/75 | HR 66

## 2014-11-14 DIAGNOSIS — R55 Syncope and collapse: Secondary | ICD-10-CM | POA: Diagnosis present

## 2014-11-14 DIAGNOSIS — G5793 Unspecified mononeuropathy of bilateral lower limbs: Secondary | ICD-10-CM

## 2014-11-14 DIAGNOSIS — I10 Essential (primary) hypertension: Secondary | ICD-10-CM | POA: Insufficient documentation

## 2014-11-14 DIAGNOSIS — Z95810 Presence of automatic (implantable) cardiac defibrillator: Secondary | ICD-10-CM | POA: Diagnosis not present

## 2014-11-14 DIAGNOSIS — E538 Deficiency of other specified B group vitamins: Secondary | ICD-10-CM

## 2014-11-14 DIAGNOSIS — G5791 Unspecified mononeuropathy of right lower limb: Secondary | ICD-10-CM

## 2014-11-14 DIAGNOSIS — I252 Old myocardial infarction: Secondary | ICD-10-CM | POA: Insufficient documentation

## 2014-11-14 DIAGNOSIS — Z885 Allergy status to narcotic agent status: Secondary | ICD-10-CM | POA: Insufficient documentation

## 2014-11-14 DIAGNOSIS — I959 Hypotension, unspecified: Secondary | ICD-10-CM

## 2014-11-14 DIAGNOSIS — M1990 Unspecified osteoarthritis, unspecified site: Secondary | ICD-10-CM | POA: Diagnosis not present

## 2014-11-14 DIAGNOSIS — E11621 Type 2 diabetes mellitus with foot ulcer: Secondary | ICD-10-CM

## 2014-11-14 DIAGNOSIS — Z9889 Other specified postprocedural states: Secondary | ICD-10-CM | POA: Insufficient documentation

## 2014-11-14 DIAGNOSIS — R944 Abnormal results of kidney function studies: Secondary | ICD-10-CM | POA: Diagnosis not present

## 2014-11-14 DIAGNOSIS — I2510 Atherosclerotic heart disease of native coronary artery without angina pectoris: Secondary | ICD-10-CM | POA: Insufficient documentation

## 2014-11-14 DIAGNOSIS — L97521 Non-pressure chronic ulcer of other part of left foot limited to breakdown of skin: Secondary | ICD-10-CM

## 2014-11-14 DIAGNOSIS — E785 Hyperlipidemia, unspecified: Secondary | ICD-10-CM | POA: Diagnosis not present

## 2014-11-14 DIAGNOSIS — R1033 Periumbilical pain: Secondary | ICD-10-CM | POA: Diagnosis not present

## 2014-11-14 DIAGNOSIS — R079 Chest pain, unspecified: Secondary | ICD-10-CM

## 2014-11-14 DIAGNOSIS — Z5189 Encounter for other specified aftercare: Secondary | ICD-10-CM | POA: Diagnosis not present

## 2014-11-14 DIAGNOSIS — G5792 Unspecified mononeuropathy of left lower limb: Secondary | ICD-10-CM

## 2014-11-14 DIAGNOSIS — Z87891 Personal history of nicotine dependence: Secondary | ICD-10-CM | POA: Insufficient documentation

## 2014-11-14 DIAGNOSIS — E1140 Type 2 diabetes mellitus with diabetic neuropathy, unspecified: Secondary | ICD-10-CM | POA: Insufficient documentation

## 2014-11-14 DIAGNOSIS — Z23 Encounter for immunization: Secondary | ICD-10-CM | POA: Diagnosis not present

## 2014-11-14 DIAGNOSIS — Z79899 Other long term (current) drug therapy: Secondary | ICD-10-CM | POA: Diagnosis not present

## 2014-11-14 DIAGNOSIS — J449 Chronic obstructive pulmonary disease, unspecified: Secondary | ICD-10-CM | POA: Diagnosis not present

## 2014-11-14 DIAGNOSIS — N179 Acute kidney failure, unspecified: Secondary | ICD-10-CM | POA: Insufficient documentation

## 2014-11-14 DIAGNOSIS — E1169 Type 2 diabetes mellitus with other specified complication: Secondary | ICD-10-CM

## 2014-11-14 DIAGNOSIS — L97509 Non-pressure chronic ulcer of other part of unspecified foot with unspecified severity: Secondary | ICD-10-CM

## 2014-11-14 DIAGNOSIS — L8990 Pressure ulcer of unspecified site, unspecified stage: Secondary | ICD-10-CM | POA: Insufficient documentation

## 2014-11-14 DIAGNOSIS — K219 Gastro-esophageal reflux disease without esophagitis: Secondary | ICD-10-CM | POA: Insufficient documentation

## 2014-11-14 DIAGNOSIS — E8809 Other disorders of plasma-protein metabolism, not elsewhere classified: Secondary | ICD-10-CM

## 2014-11-14 DIAGNOSIS — E860 Dehydration: Secondary | ICD-10-CM | POA: Diagnosis not present

## 2014-11-14 LAB — BASIC METABOLIC PANEL
ANION GAP: 7 (ref 5–15)
BUN: 19 mg/dL (ref 6–20)
CHLORIDE: 104 mmol/L (ref 101–111)
CO2: 20 mmol/L — AB (ref 22–32)
Calcium: 9 mg/dL (ref 8.9–10.3)
Creatinine, Ser: 1.8 mg/dL — ABNORMAL HIGH (ref 0.61–1.24)
GFR calc non Af Amer: 34 mL/min — ABNORMAL LOW (ref >60)
GFR, EST AFRICAN AMERICAN: 40 mL/min — AB (ref >60)
Glucose, Bld: 113 mg/dL — ABNORMAL HIGH (ref 65–99)
Potassium: 3.9 mmol/L (ref 3.5–5.1)
Sodium: 131 mmol/L — ABNORMAL LOW (ref 135–145)

## 2014-11-14 LAB — CBC WITH DIFFERENTIAL/PLATELET
BASOS ABS: 0.1 10*3/uL (ref 0–0.1)
BASOS PCT: 1 %
Eosinophils Absolute: 0.3 10*3/uL (ref 0–0.7)
Eosinophils Relative: 2 %
HEMATOCRIT: 36.4 % — AB (ref 40.0–52.0)
HEMOGLOBIN: 12.3 g/dL — AB (ref 13.0–18.0)
Lymphocytes Relative: 12 %
Lymphs Abs: 1.2 10*3/uL (ref 1.0–3.6)
MCH: 29.6 pg (ref 26.0–34.0)
MCHC: 33.8 g/dL (ref 32.0–36.0)
MCV: 87.6 fL (ref 80.0–100.0)
Monocytes Absolute: 0.6 10*3/uL (ref 0.2–1.0)
Monocytes Relative: 6 %
NEUTROS ABS: 8.6 10*3/uL — AB (ref 1.4–6.5)
NEUTROS PCT: 79 %
Platelets: 340 10*3/uL (ref 150–440)
RBC: 4.16 MIL/uL — AB (ref 4.40–5.90)
RDW: 14.4 % (ref 11.5–14.5)
WBC: 10.8 10*3/uL — AB (ref 3.8–10.6)

## 2014-11-14 LAB — GLUCOSE, CAPILLARY
Glucose-Capillary: 131 mg/dL — ABNORMAL HIGH (ref 65–99)
Glucose-Capillary: 111 mg/dL — ABNORMAL HIGH (ref 65–99)

## 2014-11-14 LAB — TSH: TSH: 0.463 u[IU]/mL (ref 0.350–4.500)

## 2014-11-14 LAB — HEMOGLOBIN A1C: Hgb A1c MFr Bld: 6.1 % — ABNORMAL HIGH (ref 4.0–6.0)

## 2014-11-14 LAB — TROPONIN I

## 2014-11-14 MED ORDER — PNEUMOCOCCAL VAC POLYVALENT 25 MCG/0.5ML IJ INJ
0.5000 mL | INJECTION | INTRAMUSCULAR | Status: AC
  Administered 2014-11-15: 0.5 mL via INTRAMUSCULAR
  Filled 2014-11-14: qty 0.5

## 2014-11-14 MED ORDER — GABAPENTIN 300 MG PO CAPS
300.0000 mg | ORAL_CAPSULE | Freq: Three times a day (TID) | ORAL | Status: DC
  Administered 2014-11-14 – 2014-11-15 (×3): 300 mg via ORAL
  Filled 2014-11-14 (×3): qty 1

## 2014-11-14 MED ORDER — VITAMIN B-12 1000 MCG PO TABS
5000.0000 ug | ORAL_TABLET | Freq: Every day | ORAL | Status: DC
  Administered 2014-11-14 – 2014-11-15 (×2): 5000 ug via ORAL
  Filled 2014-11-14 (×2): qty 5

## 2014-11-14 MED ORDER — ACETAMINOPHEN 325 MG PO TABS
650.0000 mg | ORAL_TABLET | Freq: Four times a day (QID) | ORAL | Status: DC | PRN
  Administered 2014-11-14: 650 mg via ORAL
  Filled 2014-11-14: qty 2

## 2014-11-14 MED ORDER — ENOXAPARIN SODIUM 40 MG/0.4ML ~~LOC~~ SOLN
40.0000 mg | SUBCUTANEOUS | Status: DC
  Administered 2014-11-14: 40 mg via SUBCUTANEOUS
  Filled 2014-11-14: qty 0.4

## 2014-11-14 MED ORDER — INSULIN ASPART 100 UNIT/ML ~~LOC~~ SOLN
0.0000 [IU] | Freq: Three times a day (TID) | SUBCUTANEOUS | Status: DC
  Administered 2014-11-15 (×2): 1 [IU] via SUBCUTANEOUS
  Filled 2014-11-14 (×2): qty 1

## 2014-11-14 MED ORDER — SODIUM CHLORIDE 0.9 % IJ SOLN
3.0000 mL | Freq: Two times a day (BID) | INTRAMUSCULAR | Status: DC
  Administered 2014-11-14 – 2014-11-15 (×2): 3 mL via INTRAVENOUS

## 2014-11-14 MED ORDER — CLINDAMYCIN HCL 300 MG PO CAPS
300.0000 mg | ORAL_CAPSULE | Freq: Three times a day (TID) | ORAL | Status: DC
  Administered 2014-11-14 – 2014-11-15 (×4): 300 mg via ORAL
  Filled 2014-11-14 (×3): qty 1
  Filled 2014-11-14: qty 2
  Filled 2014-11-14 (×5): qty 1

## 2014-11-14 MED ORDER — ASPIRIN 81 MG PO CHEW
324.0000 mg | CHEWABLE_TABLET | Freq: Once | ORAL | Status: AC
  Administered 2014-11-14: 324 mg via ORAL
  Filled 2014-11-14: qty 4

## 2014-11-14 MED ORDER — ATORVASTATIN CALCIUM 20 MG PO TABS
40.0000 mg | ORAL_TABLET | Freq: Every day | ORAL | Status: DC
  Administered 2014-11-14: 40 mg via ORAL
  Filled 2014-11-14: qty 2

## 2014-11-14 MED ORDER — VITAMIN B-12 5000 MCG SL SUBL: 5000.0000 ug | SUBLINGUAL_TABLET | Freq: Every day | SUBLINGUAL | Status: DC

## 2014-11-14 MED ORDER — PANTOPRAZOLE SODIUM 40 MG PO TBEC
40.0000 mg | DELAYED_RELEASE_TABLET | Freq: Every day | ORAL | Status: DC
  Administered 2014-11-14 – 2014-11-15 (×2): 40 mg via ORAL
  Filled 2014-11-14 (×2): qty 1

## 2014-11-14 MED ORDER — TRAMADOL HCL 50 MG PO TABS: 50.0000 mg | ORAL_TABLET | Freq: Four times a day (QID) | ORAL | Status: DC | PRN

## 2014-11-14 NOTE — Telephone Encounter (Signed)
Patient came in for labs. 

## 2014-11-14 NOTE — Progress Notes (Signed)
Notified physician about patient's feet pain, no pain medication currently ordered. Dr.Kalisetti placed orders.

## 2014-11-14 NOTE — Progress Notes (Signed)
Patient came in for wound change. Redessed both feet.

## 2014-11-14 NOTE — ED Provider Notes (Signed)
[facility] Emergency Department Provider Note  ____________________________________________  Time seen: Seen upon arrival to the emergency department  I have reviewed the triage vital signs and the nursing notes.   HISTORY  Chief Complaint Near Syncope    HPI [name] is a 80 y.o. male with a history of COPD and diabetes who is presenting today with a near-syncopal episode at his wound care appointment. He said that he had chest pain and shortness of breath for about 10-12 minutes during the time when he felt like he was going to pass out. He had a blood pressure taken prior to arrival which was 60 systolic. By the time EMS arrived his blood pressure had come up to the low 100s systolic. He denies any chest pain or shortness of breath at this time. He does say he feels a "unusual feeling to his left lower chest." The chest pain during this episode was left-sided and nonradiating.   Past Medical History  Diagnosis Date  . COPD (chronic obstructive pulmonary disease)   . Diabetes mellitus without complication   . History of blood clots   . GERD (gastroesophageal reflux disease)   . Hyperlipidemia   . Hypertension   . CAD (coronary artery disease)   . Osteomyelitis   . Arthritis   . PN (peripheral neuropathy)     Patient Active Problem List   Diagnosis Date Noted  . Syncope and collapse 11/14/2014  . Hyperproteinemia 11/02/2014  . Hypoalbuminemia 11/02/2014  . Arthritis 10/23/2014  . Arteriosclerosis of coronary artery 10/23/2014  . Diabetic foot ulcer associated with type 2 diabetes mellitus 10/23/2014  . Amputation of finger of left hand 10/23/2014  . History of pulmonary embolism 10/23/2014  . Literacy problem 10/23/2014  . Umbilical hernia without obstruction or gangrene 10/23/2014  . Vitamin B12 deficiency 10/23/2014  . Neuropathy of lower extremity 10/23/2014  . History of osteomyelitis 10/23/2014  . Complication of diabetes mellitus  03/27/2014  . Artificial cardiac pacemaker 08/10/2013  . H/O coronary artery bypass surgery 08/10/2013  . Benign prostatic hyperplasia with urinary obstruction 09/27/2012  . Abnormal prostate specific antigen 09/27/2012    Past Surgical History  Procedure Laterality Date  . Pacemaker insertion    . Appendectomy    . Coronary artery bypass graft  2002  . Clavicle surgery  2012    open reduction and internal fixation of left clavicle  . Amputation 4th finger Left     Current Outpatient Rx  Name  Route  Sig  Dispense  Refill  . atorvastatin (LIPITOR) 40 MG tablet   Oral   Take 40 mg by mouth at bedtime.         . Cyanocobalamin (VITAMIN B-12) 5000 MCG SUBL   Sublingual   Place 5,000 mcg under the tongue daily.         [name] gabapentin (NEURONTIN) 300 MG capsule   Oral   Take 300 mg by mouth 3 (three) times daily.         . metFORMIN (GLUCOPHAGE-XR) 500 MG 24 hr tablet   Oral   Take 500 mg by mouth daily at 12 noon.         [name] omeprazole (PRILOSEC) 40 MG capsule   Oral   Take 40 mg by mouth daily.         [name] OVER THE COUNTER MEDICATION   Oral   Take 2 capsules by mouth daily. Pt takes OmegaXL.         [name]  ramipril (ALTACE) 5 MG capsule   Oral   Take 5 mg by mouth daily.         [name] sulfamethoxazole-trimethoprim (BACTRIM DS,SEPTRA DS) 800-160 MG tablet   Oral   Take 1 tablet by mouth 2 (two) times daily. For 10 days.           Allergies Percocet  Family History  Problem Relation Age of Onset  . Cancer Mother     spine  . Hypertension Mother   . Mental illness Mother   . Cancer Father     lung  . Asthma Father   . Allergic rhinitis Father   . Arthritis Father     Social History Social History  Substance Use Topics  . Smoking status: Former Smoker -- 0.25 packs/day    Types: Cigarettes, Cigars    Quit date: 10/18/1979  . Smokeless tobacco: Never Used  . Alcohol Use: No    Review of Systems Constitutional: No fever/chills Eyes: No visual  changes. ENT: No sore throat. Cardiovascular: As above  Respiratory: As above  Gastrointestinal: No abdominal pain.  No nausea, no vomiting.  No diarrhea.  No constipation. Genitourinary: Negative for dysuria. Musculoskeletal: Negative for back pain. Skin: Negative for rash. Neurological: Negative for headaches, focal weakness or numbness.  10-point ROS otherwise negative.  ____________________________________________   PHYSICAL EXAM:  VITAL SIGNS: ED Triage Vitals  Enc Vitals Group     BP 11/14/14 1023 117/60 mmHg     Pulse Rate 11/14/14 1023 63     Resp 11/14/14 1023 16     Temp 11/14/14 1023 98 F (36.7 C)     Temp Source 11/14/14 1023 Oral     SpO2 11/14/14 1008 96 %     Weight 11/14/14 1023 210 lb (95.255 kg)     Height 11/14/14 1023 6' (1.829 m)     Head Cir --      Peak Flow --      Pain Score --      Pain Loc --      Pain Edu? --      Excl. in GC? --     Constitutional: Alert and oriented. Well appearing and in no acute distress. Eyes: Conjunctivae are normal. PERRL. EOMI. Head: Atraumatic. Nose: No congestion/rhinnorhea. Mouth/Throat: Mucous membranes are moist.  Oropharynx non-erythematous. Neck: No stridor.   Cardiovascular: Normal rate, regular rhythm. Grossly normal heart sounds.  Good peripheral circulation. Respiratory: Normal respiratory effort.  No retractions. Lungs CTAB. Gastrointestinal: Soft and nontender. No distention. No abdominal bruits. No CVA tenderness. Musculoskeletal: No lower extremity tenderness nor edema.  No joint effusions. Neurologic:  Normal speech and language. No gross focal neurologic deficits are appreciated. No gait instability. Skin:  Plantar surfaces of the anterior feet under the MTP joints with 3 x 4 cm ulcerated areas. On the right foot this also extends to the tendon level. However, there is no induration or pus. There is only mild tenderness. There appears to be healthy granulation tissue to the ulcers. Psychiatric:  Mood and affect are normal. Speech and behavior are normal.  ____________________________________________   LABS (all labs ordered are listed, but only abnormal results are displayed)  Labs Reviewed  CBC WITH DIFFERENTIAL/PLATELET - Abnormal; Notable for the following:    WBC 10.8 (*)    RBC 4.16 (*)    Hemoglobin 12.3 (*)    HCT 36.4 (*)    Neutro Abs 8.6 (*)    All other components within normal limits  BASIC  METABOLIC PANEL - Abnormal; Notable for the following:    Sodium 131 (*)    CO2 20 (*)    Glucose, Bld 113 (*)    Creatinine, Ser 1.80 (*)    GFR calc non Af Amer 34 (*)    GFR calc Af Amer 40 (*)    All other components within normal limits  TROPONIN I  HEMOGLOBIN A1C   ____________________________________________  EKG  ED ECG REPORT I, Arelia Longest, the attending physician, personally viewed and interpreted this ECG.   Date: 11/14/2014  EKG Time: 1019  Rate: 62  Rhythm: AV dual paced rhythm.   Intervals:Wide-complex QRS consistent with ventricular pacing.  ST&T Change: No ST segment elevation or depression. T-wave inversions in 1 and aVL as well as V2. No change from 04/04/2014. ____________________________________________  RADIOLOGY  No acute abnormality on the chest x-ray. ____________________________________________   PROCEDURES   ____________________________________________   INITIAL IMPRESSION / ASSESSMENT AND PLAN / ED COURSE  Pertinent labs & imaging results that were available during my care of the patient were reviewed by me and considered in my medical decision making (see chart for details).  ----------------------------------------- 12:25 PM on 11/14/2014 -----------------------------------------  Patient reassessed and resting comfortably without any component of chest pain at this time. Due to patient's past cardiac history as well as hypotension and shortness of breath at the time of his near syncope he will be admitted to  the hospital. He was signed out to Dr. Allena Katz of the admitting medicine service. It is unclear (and his pacemaker is. I was unable to see a brand name from the chest x-ray. Unable to find record of pacemaker insertion in this patient's records. He says he may have had it done around 2002. The patient himself does not know the brand of the pacemaker does not have a brand card in his wallet. ____________________________________________   FINAL CLINICAL IMPRESSION(S) / ED DIAGNOSES  Final diagnoses:  Chest pain, unspecified chest pain type  Near syncope      Myrna Blazer, MD 11/14/14 1226

## 2014-11-14 NOTE — H&P (Signed)
[facility] [place] [facility]   PATIENT NAME: [name]    MR#:  [id number]  DATE OF BIRTH:  October 08, 1934  DATE OF ADMISSION:  11/14/2014  PRIMARY CARE PHYSICIAN: Baruch Gouty, MD   REQUESTING/REFERRING PHYSICIAN: Joellyn Haff  CHIEF COMPLAINT:   Chief Complaint  Patient presents with  . Near Syncope    HISTORY OF PRESENT ILLNESS: [name]  is a 80 y.o. male with a known history of  COPD, diabetes type 2, GERD, coronary artery disease, bilateral ulcers in his lower extremities was currently on Bactrim therapy and follows up at the wound care. Patient was at the wound care earlier today when he started feeling like he was given a pass out. His blood pressure was noted to be in the 60s. He came to the emergency room with the symptoms. He is noted to have elevated creatinine. He states that he's been feeling weak and dizzy for the past 2-3 weeks. Does not have any chest pain has chronic shortness of breath denies any nausea vomiting or diarrhea. PAST MEDICAL HISTORY:   Past Medical History  Diagnosis Date  . COPD (chronic obstructive pulmonary disease)   . Diabetes mellitus without complication   . History of blood clots   . GERD (gastroesophageal reflux disease)   . Hyperlipidemia   . Hypertension   . CAD (coronary artery disease)   . Osteomyelitis   . Arthritis   . PN (peripheral neuropathy)     PAST SURGICAL HISTORY:  Past Surgical History  Procedure Laterality Date  . Pacemaker insertion    . Appendectomy    . Coronary artery bypass graft  2002  . Clavicle surgery  2012    open reduction and internal fixation of left clavicle  . Amputation 4th finger Left     SOCIAL HISTORY:  Social History  Substance Use Topics  . Smoking status: Former Smoker -- 0.25 packs/day    Types: Cigarettes, Cigars    Quit date: 10/18/1979  . Smokeless tobacco: Never Used  . Alcohol Use: No    FAMILY HISTORY:  Family History  Problem Relation  Age of Onset  . Cancer Mother     spine  . Hypertension Mother   . Mental illness Mother   . Cancer Father     lung  . Asthma Father   . Allergic rhinitis Father   . Arthritis Father     DRUG ALLERGIES:  Allergies  Allergen Reactions  . Percocet [Oxycodone-Acetaminophen] Other (See Comments)    Reaction:  Irritability     REVIEW OF SYSTEMS:   CONSTITUTIONAL: Positive fever and feels, positive fatigue and weakness.  EYES: No blurred or double vision.  EARS, NOSE, AND THROAT: No tinnitus or ear pain.  RESPIRATORY: No cough, positive shortness of breath, no wheezing or hemoptysis.  CARDIOVASCULAR: No chest pain, orthopnea, edema.  GASTROINTESTINAL: No nausea, vomiting, diarrhea or abdominal pain.  GENITOURINARY: No dysuria, hematuria.  ENDOCRINE: No polyuria, nocturia,  HEMATOLOGY: No anemia, easy bruising or bleeding SKIN: Bilateral foot ulcers MUSCULOSKELETAL: No joint pain or arthritis.   NEUROLOGIC: No tingling, numbness, weakness.  PSYCHIATRY: No anxiety or depression.   MEDICATIONS AT HOME:  Prior to Admission medications   Medication Sig Start Date End Date Taking? Authorizing Provider  atorvastatin (LIPITOR) 40 MG tablet Take 40 mg by mouth at bedtime.   Yes Historical Provider, MD  Cyanocobalamin (VITAMIN B-12) 5000 MCG SUBL Place 5,000 mcg under the tongue daily.   Yes Historical Provider,  MD  gabapentin (NEURONTIN) 300 MG capsule Take 300 mg by mouth 3 (three) times daily.   Yes Historical Provider, MD  metFORMIN (GLUCOPHAGE-XR) 500 MG 24 hr tablet Take 500 mg by mouth daily at 12 noon.   Yes Historical Provider, MD  omeprazole (PRILOSEC) 40 MG capsule Take 40 mg by mouth daily.   Yes Historical Provider, MD  OVER THE COUNTER MEDICATION Take 2 capsules by mouth daily. Pt takes OmegaXL.   Yes Historical Provider, MD  ramipril (ALTACE) 5 MG capsule Take 5 mg by mouth daily.   Yes Historical Provider, MD  sulfamethoxazole-trimethoprim (BACTRIM DS,SEPTRA DS) 800-160  MG tablet Take 1 tablet by mouth 2 (two) times daily. For 10 days. 11/06/14  Yes Historical Provider, MD      PHYSICAL EXAMINATION:   VITAL SIGNS: Blood pressure 110/59, pulse 59, temperature 98 F (36.7 C), temperature source Oral, resp. rate 17, height 6' (1.829 m), weight 95.255 kg (210 lb), SpO2 99 %.  GENERAL:  80 y.o.-year-old patient lying in the bed with no acute distress.  EYES: Pupils equal, round, reactive to light and accommodation. No scleral icterus. Extraocular muscles intact.  HEENT: Head atraumatic, normocephalic. Oropharynx and nasopharynx clear.  NECK:  Supple, no jugular venous distention. No thyroid enlargement, no tenderness.  LUNGS: Normal breath sounds bilaterally, no wheezing, rales,rhonchi or crepitation. No use of accessory muscles of respiration.  CARDIOVASCULAR: S1, S2 normal. No murmurs, rubs, or gallops.  ABDOMEN: Soft, nontender, nondistended. Bowel sounds present. No organomegaly or mass.  EXTREMITIES: No pedal edema, cyanosis, or clubbing.  NEUROLOGIC: Cranial nerves II through XII are intact. Muscle strength 5/5 in all extremities. Sensation intact. Gait not checked.  PSYCHIATRIC: The patient is alert and oriented x 3.  SKIN: Bilateral foot ulcers with some whitish drainage in the right foot ulcer   LABORATORY PANEL:   CBC  Recent Labs Lab 11/14/14 1025  WBC 10.8*  HGB 12.3*  HCT 36.4*  PLT 340  MCV 87.6  MCH 29.6  MCHC 33.8  RDW 14.4  LYMPHSABS 1.2  MONOABS 0.6  EOSABS 0.3  BASOSABS 0.1   ------------------------------------------------------------------------------------------------------------------  Chemistries   Recent Labs Lab 11/14/14 1025  NA 131*  K 3.9  CL 104  CO2 20*  GLUCOSE 113*  BUN 19  CREATININE 1.80*  CALCIUM 9.0   ------------------------------------------------------------------------------------------------------------------ estimated creatinine clearance is 40.5 mL/min (by C-G formula based on Cr of  1.8). ------------------------------------------------------------------------------------------------------------------ No results for input(s): TSH, T4TOTAL, T3FREE, THYROIDAB in the last 72 hours.  Invalid input(s): FREET3   Coagulation profile No results for input(s): INR, PROTIME in the last 168 hours. ------------------------------------------------------------------------------------------------------------------- No results for input(s): DDIMER in the last 72 hours. -------------------------------------------------------------------------------------------------------------------  Cardiac Enzymes  Recent Labs Lab 11/14/14 1025  TROPONINI <0.03   ------------------------------------------------------------------------------------------------------------------ Invalid input(s): POCBNP  ---------------------------------------------------------------------------------------------------------------  Urinalysis No results found for: COLORURINE, APPEARANCEUR, LABSPEC, PHURINE, GLUCOSEU, HGBUR, BILIRUBINUR, KETONESUR, PROTEINUR, UROBILINOGEN, NITRITE, LEUKOCYTESUR   RADIOLOGY: Dg Chest 1 View  11/14/2014   CLINICAL DATA:  Diabetic foot ulcers. Evaluate foot wound. Witnessed syncopal episode.  EXAM: CHEST 1 VIEW  COMPARISON:  09/08/2013.  FINDINGS: LEFT subclavian AICD. No interval change compared to last year. Coronary sinus lead is present.  Median sternotomy, likely for CABG.  Old ORIF of the LEFT clavicle.  Lung volumes are improved compared to the prior exam. The cardiopericardial silhouette is within normal limits for size. There is no airspace disease or pleural effusion. Aortic arch atherosclerosis.  IMPRESSION: No acute abnormality. Improved lung volumes compared to prior  study. Postsurgical and postprocedural changes of the chest related to cardiac disease.   Electronically Signed   By: Andreas Newport M.D.   On: 11/14/2014 11:28    EKG: Orders placed or performed during  the hospital encounter of 11/14/14  . EKG 12-Lead  . EKG 12-Lead    IMPRESSION AND PLAN: #1 near syncope likely due to hypotension: Monitored on telemetry, obtain echocardiogram of the heart give IV fluids  #2 acute renal failure likely due to some dehydration and Bactrim therapy give IV fluids hold off on Bactrim for now  #3 DM2 hold metformin in light of creatinine being elevated Place on sliding scale insulin for now check a hemoglobin A1c  #4. Hypertension hypotension earlier hold ramipril monitor blood pressure  #5. Bilateral lower extremity ulcers with his creatinine being elevated I will start him on clindamycin for time being   All the records are reviewed and case discussed with ED provider. Management plans discussed with the patient, family and they are in agreement.  CODE STATUS:    TOTAL TIME TAKING CARE OF THIS PATIENT: 55 minutes.    Auburn Bilberry M.D on 11/14/2014 at 12:18 PM  Between 7am to 6pm - Pager - [phone]  After 6pm go to [email or web address] - password [email or web address] [facility]  [facility]  Office  [phone]  CC: Primary care physician; Baruch Gouty, MD

## 2014-11-14 NOTE — ED Notes (Signed)
Meal tray ordered for patient.

## 2014-11-14 NOTE — Progress Notes (Signed)
*  PRELIMINARY RESULTS* Echocardiogram 2D Echocardiogram has been performed.  Garrel Ridgel Stills 11/14/2014, 6:10 PM

## 2014-11-14 NOTE — ED Notes (Signed)
Patient brought in via EMS [facility].  Patient at doctor office having bilateral diabetic foot ulcers wound check and had witnessed syncopal episode by office nurse.  Patient denies loss of conscious.  Patient is alert and oriented upon arrival.

## 2014-11-14 NOTE — Telephone Encounter (Signed)
Patient is now in the hospital; please let their social worker know the information below; please let them know we would love to have them help with his disposition, care needs for when he leaves the hospital

## 2014-11-15 ENCOUNTER — Ambulatory Visit: Payer: Medicare PPO | Admitting: Family Medicine

## 2014-11-15 ENCOUNTER — Ambulatory Visit: Payer: Medicare PPO | Admitting: Surgery

## 2014-11-15 DIAGNOSIS — L8990 Pressure ulcer of unspecified site, unspecified stage: Secondary | ICD-10-CM | POA: Insufficient documentation

## 2014-11-15 LAB — PROTEIN ELECTROPHORESIS, URINE REFLEX
Albumin ELP, Urine: 50.9 %
Alpha-1-Globulin, U: 0.7 %
Alpha-2-Globulin, U: 10.1 %
Beta Globulin, U: 19.5 %
Gamma Globulin, U: 18.8 %
Protein, Ur: 26.8 mg/dL

## 2014-11-15 LAB — MICROALBUMIN / CREATININE URINE RATIO
Creatinine, Urine: 151.3 mg/dL
MICROALB/CREAT RATIO: 36.9 mg/g creat — ABNORMAL HIGH (ref 0.0–30.0)
Microalbumin, Urine: 55.8 ug/mL

## 2014-11-15 LAB — VITAMIN B12

## 2014-11-15 LAB — GLUCOSE, CAPILLARY
GLUCOSE-CAPILLARY: 125 mg/dL — AB (ref 65–99)
GLUCOSE-CAPILLARY: 100 mg/dL — AB (ref 65–99)
Glucose-Capillary: 124 mg/dL — ABNORMAL HIGH (ref 65–99)

## 2014-11-15 LAB — BASIC METABOLIC PANEL
Anion gap: 8 (ref 5–15)
BUN: 20 mg/dL (ref 6–20)
CHLORIDE: 107 mmol/L (ref 101–111)
CO2: 21 mmol/L — AB (ref 22–32)
Calcium: 9 mg/dL (ref 8.9–10.3)
Creatinine, Ser: 1.43 mg/dL — ABNORMAL HIGH (ref 0.61–1.24)
GFR calc Af Amer: 53 mL/min — ABNORMAL LOW (ref >60)
GFR calc non Af Amer: 45 mL/min — ABNORMAL LOW (ref >60)
Glucose, Bld: 121 mg/dL — ABNORMAL HIGH (ref 65–99)
Potassium: 3.8 mmol/L (ref 3.5–5.1)
Sodium: 136 mmol/L (ref 135–145)

## 2014-11-15 LAB — CBC
HEMATOCRIT: 31.9 % — AB (ref 40.0–52.0)
HEMOGLOBIN: 10.8 g/dL — AB (ref 13.0–18.0)
MCH: 29.3 pg (ref 26.0–34.0)
MCHC: 33.8 g/dL (ref 32.0–36.0)
MCV: 86.7 fL (ref 80.0–100.0)
Platelets: 302 10*3/uL (ref 150–440)
RBC: 3.68 MIL/uL — ABNORMAL LOW (ref 4.40–5.90)
RDW: 14.3 % (ref 11.5–14.5)
WBC: 6.8 10*3/uL (ref 3.8–10.6)

## 2014-11-15 LAB — LIPID PANEL W/O CHOL/HDL RATIO
CHOLESTEROL TOTAL: 152 mg/dL (ref 100–199)
HDL: 36 mg/dL — AB (ref >39)
LDL Calculated: 95 mg/dL (ref 0–99)
TRIGLYCERIDES: 106 mg/dL (ref 0–149)
VLDL Cholesterol Cal: 21 mg/dL (ref 5–40)

## 2014-11-15 MED ORDER — GLIPIZIDE 5 MG PO TABS: 2.5000 mg | ORAL_TABLET | Freq: Every day | ORAL | Status: DC

## 2014-11-15 MED ORDER — ASPIRIN 81 MG PO CHEW: 81.0000 mg | CHEWABLE_TABLET | Freq: Every day | ORAL | Status: DC

## 2014-11-15 MED ORDER — CLINDAMYCIN HCL 300 MG PO CAPS: 300.0000 mg | ORAL_CAPSULE | Freq: Three times a day (TID) | ORAL | Status: DC

## 2014-11-15 NOTE — Care Management (Signed)
Patient presented from home and admitted under observation for syncope.  Patient lives with his daughter.  Informed during progression that patient has chronic wounds on his feet.  Spoke with patient and he relays that he is seen by a home health agency.  Spoke with patient's daughter Kemper Durie and she says the agency is [facility].  Is seen by nursing three times a week  (M-W-F) for wound care.   The last couple of weeks patient has had appointment with the wound clinic doctor so the nurse came on Saturday.  Patient was to have a clinic apptointment today so nurse was going to see patient Saturday.  It is anticipated that patient will discharge home today.  Informed [facility] of admission and of the need for patient to be seen on Saturday.  It sounds like this had already been anticipated

## 2014-11-15 NOTE — Progress Notes (Signed)
Patient to be d/c'd home. Discharge complete waiting on patient's daughter, supposed to pick up at 6:30. Patient has no complaints at this time. Trudee Kuster

## 2014-11-15 NOTE — Discharge Summary (Signed)
[facility] - [place] at [facility]   PATIENT NAME: [name]    MR#:  [id number]  DATE OF BIRTH:  02-29-1934  DATE OF ADMISSION:  11/14/2014 ADMITTING PHYSICIAN: Auburn Bilberry, MD  DATE OF DISCHARGE: 11/15/2014 PRIMARY CARE PHYSICIAN: Baruch Gouty, MD    ADMISSION DIAGNOSIS:  Near syncope [R55] Chest pain, unspecified chest pain type [R07.9]  DISCHARGE DIAGNOSIS:  Active Problems:   Syncope and collapse   Pressure ulcer   SECONDARY DIAGNOSIS:   Past Medical History  Diagnosis Date  . COPD (chronic obstructive pulmonary disease)   . Diabetes mellitus without complication   . History of blood clots   . GERD (gastroesophageal reflux disease)   . Hyperlipidemia   . Hypertension   . CAD (coronary artery disease)   . Osteomyelitis   . Arthritis   . PN (peripheral neuropathy)     HOSPITAL COURSE:  This is 80 year old male with known history of COPD, diabetes and CAD who presented with near syncope. Further details please refer to H&P.  1. Near syncope: This is secondary to hypotension. Patient had no abnormalities on telemetry and troponin 3 were negative.  2. Acute kidney injury: Secondary to dehydration and possibly related to Bactrim that he was receiving. His creatinine has much improved. His ACEI is being held but needs to be restarted once he follows up with his PCP an has another BMP to assure creatinine is stable.  3. Type 2 diabetes: Patient is on metformin. His GFR is less than 60 therefore metformin has been discontinued in its place glipizide has been ordered. His A1c was 6.1.  4. Hypotension with history of hypertension: Patient's blood pressure medication was held due to low blood pressure. His blood pressures improved. Again Ramipril should be restarted after next BMP if creatinine remains stable.  5. bilateral lower extremity ulcers: These are chronic and is being followed by wound care. Patient was transitioned from Bactrim to  clindamycin due to the acute kidney injury. He was seen by our wound care consultant who recommended Cleanse ulcers to bilateral plantar feet with NS and pat gently dry. Gently fill with Aquacel Ag for absorption and antimicrobial protection. Cover with 4x4 gauze and kerlix. Secure with tape. Change Mon/Wed/Fri  6. CAD: ECHO shows EF 45% with hypokinesis likely from prior MI. I will have him follow up with [facility] cardiology. He will continue atorvastatin and would benefit from BB and restarting ACEI if blood pressure allows.  DISCHARGE CONDITIONS AND DIET:  Patient is being discharged home with home health in stable condition on a diabetic diet  CONSULTS OBTAINED:  Treatment Team:  Auburn Bilberry, MD  DRUG ALLERGIES:   Allergies  Allergen Reactions  . Percocet [Oxycodone-Acetaminophen] Other (See Comments)    Reaction:  Irritability     DISCHARGE MEDICATIONS:   Current Discharge Medication List    START taking these medications   Details  clindamycin (CLEOCIN) 300 MG capsule Take 1 capsule (300 mg total) by mouth every 8 (eight) hours. Qty: 24 capsule, Refills: 0    glipiZIDE (GLUCOTROL) 5 MG tablet Take 0.5 tablets (2.5 mg total) by mouth daily before breakfast. Qty: 60 tablet, Refills: 0      CONTINUE these medications which have NOT CHANGED   Details  atorvastatin (LIPITOR) 40 MG tablet Take 40 mg by mouth at bedtime.    Cyanocobalamin (VITAMIN B-12) 5000 MCG SUBL Place 5,000 mcg under the tongue daily.    gabapentin (NEURONTIN) 300 MG capsule Take 300 mg  by mouth 3 (three) times daily.    omeprazole (PRILOSEC) 40 MG capsule Take 40 mg by mouth daily.    OVER THE COUNTER MEDICATION Take 2 capsules by mouth daily. Pt takes OmegaXL.      STOP taking these medications     metFORMIN (GLUCOPHAGE-XR) 500 MG 24 hr tablet      ramipril (ALTACE) 5 MG capsule      sulfamethoxazole-trimethoprim (BACTRIM DS,SEPTRA DS) 800-160 MG tablet               Today    CHIEF COMPLAINT:  Patient is doing well this morning. Patient reports no dizziness, chest pain or shortness of breath.   VITAL SIGNS:  Blood pressure 104/62, pulse 59, temperature 97.9 F (36.6 C), temperature source Oral, resp. rate 19, height 6' (1.829 m), weight 95.255 kg (210 lb), SpO2 99 %.   REVIEW OF SYSTEMS:  Review of Systems  Constitutional: Negative for fever, chills and malaise/fatigue.  HENT: Negative for sore throat.   Eyes: Negative for blurred vision.  Respiratory: Negative for cough, hemoptysis, shortness of breath and wheezing.   Cardiovascular: Negative for chest pain, palpitations and leg swelling.  Gastrointestinal: Negative for nausea, vomiting, abdominal pain, diarrhea and blood in stool.  Genitourinary: Negative for dysuria.  Musculoskeletal: Negative for back pain.  Skin:       Bilateral heel ulcers  Neurological: Negative for dizziness, tremors and headaches.  Endo/Heme/Allergies: Does not bruise/bleed easily.     PHYSICAL EXAMINATION:  GENERAL:  80 y.o.-year-old patient lying in the bed with no acute distress.  NECK:  Supple, no jugular venous distention. No thyroid enlargement, no tenderness.  LUNGS: Normal breath sounds bilaterally, no wheezing, rales,rhonchi  No use of accessory muscles of respiration.  CARDIOVASCULAR: S1, S2 normal. No murmurs, rubs, or gallops.  ABDOMEN: Soft, non-tender, non-distended. Bowel sounds present. No organomegaly or mass.  EXTREMITIES: No pedal edema, cyanosis, or clubbing.  PSYCHIATRIC: The patient is alert and oriented x 3.  SKIN: Full-thickness Bilateral heel ulcers  DATA REVIEW:   CBC  Recent Labs Lab 11/15/14 0521  WBC 6.8  HGB 10.8*  HCT 31.9*  PLT 302    Chemistries   Recent Labs Lab 11/15/14 0521  NA 136  K 3.8  CL 107  CO2 21*  GLUCOSE 121*  BUN 20  CREATININE 1.43*  CALCIUM 9.0    Cardiac Enzymes  Recent Labs Lab 11/14/14 1025  TROPONINI <0.03    Microbiology Results   @  RADIOLOGY:  Dg Chest 1 View  11/14/2014   CLINICAL DATA:  Diabetic foot ulcers. Evaluate foot wound. Witnessed syncopal episode.  EXAM: CHEST 1 VIEW  COMPARISON:  09/08/2013.  FINDINGS: LEFT subclavian AICD. No interval change compared to last year. Coronary sinus lead is present.  Median sternotomy, likely for CABG.  Old ORIF of the LEFT clavicle.  Lung volumes are improved compared to the prior exam. The cardiopericardial silhouette is within normal limits for size. There is no airspace disease or pleural effusion. Aortic arch atherosclerosis.  IMPRESSION: No acute abnormality. Improved lung volumes compared to prior study. Postsurgical and postprocedural changes of the chest related to cardiac disease.   Electronically Signed   By: Andreas Newport M.D.   On: 11/14/2014 11:28      Management plans discussed with the patient and he is in agreement. Stable for discharge home with [facility]  Patient should follow up with PCP in 1 week And CARDIOLOGY in 1 week CODE STATUS:  Code Status Orders        Start     Ordered   11/14/14 1559  Full code   Continuous     11/14/14 1558      TOTAL TIME TAKING CARE OF THIS PATIENT: 35 minutes.    MODY, SITAL M.D on 11/15/2014 at 12:20 PM  Between 7am to 6pm - Pager - [phone] After 6pm go to [email or web address] - password [email or web address] [facility]  [place]  Office  [phone]  CC: Primary care physician; Baruch Gouty, MD

## 2014-11-15 NOTE — Consult Note (Signed)
WOC wound consult note Reason for Consult: Full thickness neuropathic ulcers to bilateral plantar feet, present on admission.  Wound type:Neuropathic Pressure Ulcer POA: Yes Measurement: Left plantar foot at first metatarsal head 3.8 cm x 4.2 cm x 1 cm Right plantar foot 4 cm x 4.2cm x 1.2  Right plantar foot near lateral aspect 1.5 cm x 1 cm x 0.2 cm partially scabbed  Wound WUJ:WJXB pink nongranulating tissue Drainage (amount, consistency, odor) Moderate serosanguinous drainage.  No odor.  States he has a helper and they have been using a bandage. Periwound:Calloused.  States he sees a "foot doctor".  Unable to recall when he last went.  Dressing procedure/placement/frequency:Cleanse ulcers to bilateral plantar feet with NS and pat gently dry.  Gently fill with Aquacel Ag for absorption and antimicrobial protection. Cover with 4x4 gauze and kerlix.  Secure with tape.  Change Mon/Wed/Fri. Would recommend [facility] after discharge for ongoing wound care.  Will not follow at this time.  Please re-consult if needed.  Maple Hudson RN BSN CWON Pager [phone]

## 2014-11-15 NOTE — Progress Notes (Signed)
IV and tele removed. Discharge instructions given to pt with daughter. Daugher to take home. No further concerns at this time.

## 2014-11-18 LAB — PROTEIN ELECTROPHORESIS, SERUM
A/G RATIO SPE: 0.6 — AB (ref 0.7–1.7)
ALBUMIN ELP: 3 g/dL (ref 2.9–4.4)
ALPHA 1: 0.3 g/dL (ref 0.0–0.4)
Alpha 2: 1 g/dL (ref 0.4–1.0)
Beta: 1.9 g/dL — ABNORMAL HIGH (ref 0.7–1.3)
GLOBULIN, TOTAL: 5.4 g/dL — AB (ref 2.2–3.9)
Gamma Globulin: 2.1 g/dL — ABNORMAL HIGH (ref 0.4–1.8)

## 2014-11-18 LAB — HEAVY METALS PROFILE, URINE: Arsenic(Inorganic),U: NOT DETECTED ug/L (ref 0–19)

## 2014-11-18 NOTE — Assessment & Plan Note (Signed)
Patient came to me with diagnosis of "diabetic" foot ulcers and neuropathy secondary to diabetes, but I am not convinced that there is not another reason for his dense bilaterla neuropathy; this was the main reason he was getting labs today

## 2014-11-18 NOTE — Assessment & Plan Note (Signed)
Dressed by CMA; history of infection with same; I did not undress the wounds to re-examine, as EMS on their way, but infection and sepsis obviously part of hte differential

## 2014-11-18 NOTE — Progress Notes (Signed)
BP 125/75 mmHg  Pulse 66  RR: 18  Subjective:    Patient ID: [name], male    DOB: 09/30/1934, 80 y.o.   MRN: [id number]  HPI: [name] is a 80 y.o. male  No chief complaint on file. Patient presented to the window and asked to have his foot wounds redressed; he then went to the lab for bloodwork and urine testing, when the lab director sought me out and said my patient "wasn't doing well" I arrived to the lab area to find [name] sitting in the lab chair, talking, eyes halfway shut, appearing tired, rather clammy Vitals Today's Vitals   11/14/14 1321 11/14/14 1322 11/14/14 1323  BP: 65/36 103/66 125/75  Pulse: 60 61 66   He was laid on the floor with the help of several other staff members and EMS was called He denied chest pain, but said that he was having abdominal pain  Relevant past medical, surgical hx reviewed Past Medical History  Diagnosis Date  . COPD (chronic obstructive pulmonary disease)   . Diabetes mellitus without complication   . History of blood clots   . GERD (gastroesophageal reflux disease)   . Hyperlipidemia   . Hypertension   . CAD (coronary artery disease)   . Osteomyelitis   . Arthritis   . PN (peripheral neuropathy)    Past Surgical History  Procedure Laterality Date  . Pacemaker insertion    . Appendectomy    . Coronary artery bypass graft  2002  . Clavicle surgery  2012    open reduction and internal fixation of left clavicle  . Amputation 4th finger Left    Allergies and medications reviewed and updated  Review of Systems Per HPI unless specifically indicated above     Objective:    BP 125/75 mmHg  Pulse 66  Wt Readings from Last 3 Encounters:  11/14/14 210 lb (95.255 kg)  11/05/14 216 lb (97.977 kg)  10/23/14 214 lb (97.07 kg)    Physical Exam  Constitutional:  Elderly male; appears clammy, visibly ill  Eyes: EOM are normal.  Neck: No JVD present.  Cardiovascular: Normal rate and regular rhythm.    Surgical midline sternotomy scar  Pulmonary/Chest: Effort normal and breath sounds normal. He has no rales.  Abdominal: He exhibits no distension. There is tenderness (mild midline, no pulsatile mass).  Skin: He is diaphoretic. There is pallor.   Results for orders placed or performed during the hospital encounter of 11/05/14  CBC  Result Value Ref Range   WBC 9.2 3.8 - 10.6 K/uL   RBC 4.07 (L) 4.40 - 5.90 MIL/uL   Hemoglobin 12.3 (L) 13.0 - 18.0 g/dL   HCT 40.9 (L) 81.1 - 91.4 %   MCV 89.2 80.0 - 100.0 fL   MCH 30.2 26.0 - 34.0 pg   MCHC 33.8 32.0 - 36.0 g/dL   RDW 78.2 95.6 - 21.3 %   Platelets 497 (H) 150 - 440 K/uL  Glucose, capillary  Result Value Ref Range   Glucose-Capillary 131 (H) 65 - 99 mg/dL   Comment 1 Notify RN       Assessment & Plan:   Problem List Items Addressed This Visit      Endocrine   Diabetic foot ulcer associated with type 2 diabetes mellitus (HCC)    Dressed by CMA; history of infection with same; I did not undress the wounds to re-examine, as EMS on their way, but infection and sepsis obviously part of hte  differential        Nervous and Auditory   Neuropathy of lower extremity    Patient came to me with diagnosis of "diabetic" foot ulcers and neuropathy secondary to diabetes, but I am not convinced that there is not another reason for his dense bilaterla neuropathy; this was the main reason he was getting labs today       Other Visit Diagnoses    Hypotension, unspecified hypotension type    -  Primary    ddx includes cardiac etiology (heart attack, CHF, pacemaker malfunction), AAA, PE, sepsis; EMS arrived, transported patient to hospital    Visit for wound check        evaluated by CMA    Periumbilical abdominal pain        ddx includes AAA       Follow up plan: No Follow-up on file. 324 mg of aspirin given to patient as well as oxygen delivered at 2 liters/minute via nasal cannula, then increased to 4 liters/minute Patient transported  by EMS to the emergency department

## 2014-11-18 NOTE — Patient Instructions (Signed)
(  patient taken by EMS to emergency department... No AVS given)

## 2014-11-20 ENCOUNTER — Inpatient Hospital Stay: Payer: Medicare PPO | Admitting: Family Medicine

## 2014-11-21 ENCOUNTER — Inpatient Hospital Stay: Payer: Medicare PPO | Admitting: Family Medicine

## 2014-11-22 NOTE — Telephone Encounter (Signed)
Left message for social worker at [facility] to call.

## 2014-12-03 NOTE — Telephone Encounter (Signed)
Left message for social worker

## 2014-12-05 ENCOUNTER — Telehealth: Payer: Self-pay | Admitting: Family Medicine

## 2014-12-05 DIAGNOSIS — R778 Other specified abnormalities of plasma proteins: Secondary | ICD-10-CM

## 2014-12-05 NOTE — Telephone Encounter (Signed)
Patient needs a hospital follow-up appointment please Schedule him with me in the next week; we have some labs to go over as well

## 2014-12-05 NOTE — Telephone Encounter (Signed)
I called the number we have for the patient and it is no longer in service.

## 2014-12-06 NOTE — Telephone Encounter (Signed)
Please reach out to other contacts or send letter

## 2014-12-09 ENCOUNTER — Encounter: Payer: Self-pay | Admitting: Family Medicine

## 2014-12-09 NOTE — Telephone Encounter (Signed)
I called daughters phone 3x and no answer and can not leave vm due to it is full. Letter sent today.

## 2014-12-09 NOTE — Telephone Encounter (Signed)
Dr. Sherie DonLada, since I can't get [place] social services to call me back and even if they did, we are unable to get in touch with the patient due to his phone being disconnected, what should I do next with this?

## 2014-12-30 ENCOUNTER — Encounter: Payer: Self-pay | Admitting: Family Medicine

## 2014-12-30 NOTE — Telephone Encounter (Signed)
Called patient but no answer have left 2 vm to return out call back.

## 2014-12-30 NOTE — Telephone Encounter (Signed)
Please follow-up on this request and document in this note; thank you

## 2015-01-24 LAB — CULTURE, BLOOD (ROUTINE X 2): CULTURE: NO GROWTH

## 2015-07-18 ENCOUNTER — Encounter: Payer: Self-pay | Admitting: Family Medicine

## 2015-07-18 ENCOUNTER — Ambulatory Visit (INDEPENDENT_AMBULATORY_CARE_PROVIDER_SITE_OTHER): Payer: Medicare PPO | Admitting: Family Medicine

## 2015-07-18 VITALS — BP 110/60 | HR 87 | Temp 98.1°F | Resp 16 | Ht 72.0 in | Wt 201.2 lb

## 2015-07-18 DIAGNOSIS — Z950 Presence of cardiac pacemaker: Secondary | ICD-10-CM

## 2015-07-18 DIAGNOSIS — I2510 Atherosclerotic heart disease of native coronary artery without angina pectoris: Secondary | ICD-10-CM | POA: Diagnosis not present

## 2015-07-18 DIAGNOSIS — E11621 Type 2 diabetes mellitus with foot ulcer: Secondary | ICD-10-CM | POA: Diagnosis not present

## 2015-07-18 DIAGNOSIS — E538 Deficiency of other specified B group vitamins: Secondary | ICD-10-CM | POA: Diagnosis not present

## 2015-07-18 DIAGNOSIS — E8809 Other disorders of plasma-protein metabolism, not elsewhere classified: Secondary | ICD-10-CM | POA: Diagnosis not present

## 2015-07-18 DIAGNOSIS — G5793 Unspecified mononeuropathy of bilateral lower limbs: Secondary | ICD-10-CM

## 2015-07-18 DIAGNOSIS — L97519 Non-pressure chronic ulcer of other part of right foot with unspecified severity: Secondary | ICD-10-CM | POA: Diagnosis not present

## 2015-07-18 DIAGNOSIS — R972 Elevated prostate specific antigen [PSA]: Secondary | ICD-10-CM | POA: Diagnosis not present

## 2015-07-18 NOTE — Progress Notes (Signed)
BP 110/60 mmHg  Pulse 87  Temp(Src) 98.1 F (36.7 C) (Oral)  Resp 16  Ht 6' (1.829 m)  Wt 201 lb 3.2 oz (91.264 kg)  BMI 27.28 kg/m2  SpO2 95%   Subjective:    Patient ID: [name], male    DOB: 1934-06-24, 81 y.o.   MRN: [id number]  HPI: [name] is a 81 y.o. male  Chief Complaint  Patient presents with  . Foot Swelling    right foot sore on bottom of foot (ulcer)    Patient is known to me from my previous practice, but I have not seen him since Sept 2016; he has been gone for 6-8 months, living in [place] he says; no records available today, though from the provider who has been seeing him in the interim  He had surgery on his right foot, a few months ago; healed up but now having a sore again on the bottom of the right foot and lateral aspect; he healed up his ulcers on the foot, packed sores with honey, uses onion to take the fever out; sliced sweet potato and the fever went out and cooked the tater; used red clay and wrapped it over the honey and wrapped it and it healed the left foot He wants to see Dr. Alberteen Spindle, podiatrist  Diabetes; thinks his sugars are doing well by how he is feeling; not checking sugars often just once a week; average is 97-100; no feeling in the feet as before  Headaches in the front of the face, over the eyes; no drainage, no cold; 4-5 months duration; told his doctor in [place]; wonders if his ears are backed up  High PSA; went back to get checked for cancer and they said it was gone; the other day, his urine wasn't very strong but back to normal now  He has not had pacemaker checked in 2 years; here to stay; no cardiologist locally he says  Depression screen [facility] 2/9 07/18/2015  Decreased Interest 0  Down, Depressed, Hopeless 0  PHQ - 2 Score 0   Relevant past medical, surgical, family and social history reviewed Past Medical History  Diagnosis Date  . COPD (chronic obstructive pulmonary disease) (HCC)   . Diabetes mellitus  without complication (HCC)   . History of blood clots   . GERD (gastroesophageal reflux disease)   . Hyperlipidemia   . Hypertension   . CAD (coronary artery disease)   . Osteomyelitis (HCC)   . Arthritis   . PN (peripheral neuropathy) [facility])    Past Surgical History  Procedure Laterality Date  . Pacemaker insertion    . Appendectomy    . Coronary artery bypass graft  2002  . Clavicle surgery  2012    open reduction and internal fixation of left clavicle  . Amputation 4th finger Left   . Foot surgery     Family History  Problem Relation Age of Onset  . Cancer Mother     spine  . Hypertension Mother   . Mental illness Mother   . Cancer Father     lung  . Asthma Father   . Allergic rhinitis Father   . Arthritis Father    Social History  Substance Use Topics  . Smoking status: Former Smoker -- 0.25 packs/day    Types: Cigarettes, Cigars    Quit date: 10/18/1979  . Smokeless tobacco: Never Used  . Alcohol Use: No   Interim medical history since last visit reviewed. Allergies and  medications reviewed  Review of Systems Per HPI unless specifically indicated above     Objective:    BP 110/60 mmHg  Pulse 87  Temp(Src) 98.1 F (36.7 C) (Oral)  Resp 16  Ht 6' (1.829 m)  Wt 201 lb 3.2 oz (91.264 kg)  BMI 27.28 kg/m2  SpO2 95%  Wt Readings from Last 3 Encounters:  07/18/15 201 lb 3.2 oz (91.264 kg)  11/14/14 210 lb (95.255 kg)  11/05/14 216 lb (97.977 kg)    Physical Exam  Constitutional: He appears well-developed and well-nourished. No distress.  HENT:  Head: Normocephalic and atraumatic.  Right Ear: Hearing and external ear normal. No drainage or swelling.  Left Ear: Hearing and external ear normal. No drainage or swelling.  Nose: No mucosal edema, rhinorrhea, nasal deformity or septal deviation. No epistaxis.  No foreign bodies. Right sinus exhibits no maxillary sinus tenderness and no frontal sinus tenderness. Left sinus exhibits no maxillary sinus  tenderness and no frontal sinus tenderness.  Mouth/Throat: Oropharynx is clear and moist and mucous membranes are normal.  Thick cerumen in both ears with excessive hair occluding canals; some removed under direct visualization by MD  Eyes: EOM are normal. No scleral icterus.  Neck: No thyromegaly present.  Cardiovascular: Normal rate and regular rhythm.   Pulmonary/Chest: Effort normal and breath sounds normal.  Abdominal: Soft. Bowel sounds are normal. He exhibits no distension.  Musculoskeletal: He exhibits no edema.  Neurological: Coordination normal.  Skin: Skin is warm and dry. No pallor.  Psychiatric: He has a normal mood and affect. His speech is not rapid and/or pressured.  Talkative but redirectable, fair eye contact with examiner; behavior and mood consistent with previous visits    Diabetic Foot Form - Detailed   Diabetic Foot Exam - detailed  Diabetic Foot exam was performed with the following findings:  Yes 07/18/2015  4:31 PM  Visual Foot Exam completed.:  Yes  Is there a history of foot ulcer?:  Yes  Are the toenails long?:  Yes  Are the toenails thick?:  Yes  Are the toenails ingrown?:  No    Pulse Foot Exam completed.:  Yes  Right Dorsalis Pedis:  Diminished Left Dorsalis Pedis:  Diminished  Sensory Foot Exam Completed.:  Yes  Semmes-Weinstein Monofilament Test  R Site 1-Great Toe:  Neg L Site 1-Great Toe:  Neg  R Site 4:  Neg L Site 4:  Neg  R Site 5:  Neg L Site 5:  Neg          Assessment & Plan:   Problem List Items Addressed This Visit      Cardiovascular and Mediastinum   Arteriosclerosis of coronary artery (Chronic)    Check lipids, goal LDL under 70      Relevant Medications   ramipril (ALTACE) 5 MG capsule   furosemide (LASIX) 20 MG tablet   Other Relevant Orders   Lipid Panel w/o Chol/HDL Ratio (Completed)     Digestive   Vitamin B12 deficiency    Check today      Relevant Orders   Vitamin B12 (Completed)   Methylmalonic Acid  (Completed)     Endocrine   Diabetic foot ulcer associated with type 2 diabetes mellitus (HCC) - Primary (Chronic)    Refer back to Dr. Alberteen Spindleline, podiatrist; check A1c; foot exam by MD; check urine microalbumin      Relevant Medications   ramipril (ALTACE) 5 MG capsule   metFORMIN (GLUMETZA) 500 MG (MOD) 24 hr tablet  Other Relevant Orders   Ambulatory referral to Podiatry   Hemoglobin A1c (Completed)   Microalbumin / creatinine urine ratio (Completed)     Nervous and Auditory   Neuropathy of lower extremity (Chronic)    Check labs today      Relevant Orders   Protein Electrophoresis, (serum) (Completed)   Protein Electrophoresis, Urine Rflx. (Completed)   Vitamin B12 (Completed)     Other   Abnormal prostate specific antigen    Check PSA today      Relevant Orders   PSA (Completed)   Artificial cardiac pacemaker    Refer to cardiologist      Hyperproteinemia    Check SPEP and UPEP, protein      Relevant Orders   Comprehensive metabolic panel (Completed)   Protein Electrophoresis, (serum) (Completed)   Protein Electrophoresis, Urine Rflx. (Completed)   Hypoalbuminemia    Check albumin level; no visible lower ext edema today      Relevant Orders   Comprehensive metabolic panel (Completed)      Follow up plan: Return in about 2 weeks (around 08/01/2015) for follow-up, diabetes and multiple issues.  An after-visit summary was printed and given to the patient at check-out.  Please see the patient instructions which may contain other information and recommendations beyond what is mentioned above in the assessment and plan.  Meds ordered this encounter  Medications  . vitamin B-12 (CYANOCOBALAMIN) 500 MCG tablet    Sig: Take 500 mcg by mouth daily.  . Omega-3 Fatty Acids (FISH OIL) 600 MG CAPS    Sig: Take 300 mg by mouth 2 (two) times daily.  . ramipril (ALTACE) 5 MG capsule    Sig: Take 5 mg by mouth daily.  . metFORMIN (GLUMETZA) 500 MG (MOD) 24 hr tablet     Sig: Take 500 mg by mouth daily with breakfast.  . furosemide (LASIX) 20 MG tablet    Sig: Take 20 mg by mouth.    Orders Placed This Encounter  Procedures  . Hemoglobin A1c  . Comprehensive metabolic panel  . Lipid Panel w/o Chol/HDL Ratio  . Protein Electrophoresis, (serum)  . Protein Electrophoresis, Urine Rflx.  . Vitamin B12  . Methylmalonic Acid  . Microalbumin / creatinine urine ratio  . PSA  . Ambulatory referral to Podiatry

## 2015-07-18 NOTE — Assessment & Plan Note (Signed)
Check albumin level; no visible lower ext edema today

## 2015-07-18 NOTE — Assessment & Plan Note (Signed)
Check today 

## 2015-07-18 NOTE — Assessment & Plan Note (Signed)
Check PSA today. 

## 2015-07-18 NOTE — Assessment & Plan Note (Signed)
Check labs today.

## 2015-07-18 NOTE — Assessment & Plan Note (Signed)
Check SPEP and UPEP, protein

## 2015-07-18 NOTE — Assessment & Plan Note (Signed)
Check lipids, goal LDL under 70

## 2015-07-18 NOTE — Assessment & Plan Note (Signed)
Refer to cardiologist. °

## 2015-07-18 NOTE — Patient Instructions (Addendum)
We'll get labs today We'll request your records from the last doctor Return in 2 weeks I've referred you to Dr. Alberteen Spindleline

## 2015-07-18 NOTE — Assessment & Plan Note (Addendum)
Refer back to Dr. Alberteen Spindleline, podiatrist; check A1c; foot exam by MD; check urine microalbumin

## 2015-07-22 ENCOUNTER — Encounter: Payer: Self-pay | Admitting: Family Medicine

## 2015-07-22 ENCOUNTER — Other Ambulatory Visit: Payer: Self-pay | Admitting: Family Medicine

## 2015-07-22 DIAGNOSIS — D892 Hypergammaglobulinemia, unspecified: Secondary | ICD-10-CM

## 2015-07-22 HISTORY — DX: Hypergammaglobulinemia, unspecified: D89.2

## 2015-07-22 LAB — COMPREHENSIVE METABOLIC PANEL
A/G RATIO: 0.9 — AB (ref 1.2–2.2)
ALBUMIN: 4 g/dL (ref 3.5–4.8)
ALK PHOS: 114 IU/L (ref 39–117)
ALT: 11 IU/L (ref 0–44)
AST: 20 IU/L (ref 0–40)
BILIRUBIN TOTAL: 0.5 mg/dL (ref 0.0–1.2)
BUN / CREAT RATIO: 12 (ref 10–24)
BUN: 16 mg/dL (ref 8–27)
CO2: 21 mmol/L (ref 18–29)
CREATININE: 1.35 mg/dL — AB (ref 0.76–1.27)
Calcium: 10.4 mg/dL — ABNORMAL HIGH (ref 8.6–10.2)
Chloride: 101 mmol/L (ref 96–106)
GFR calc Af Amer: 58 mL/min/{1.73_m2} — ABNORMAL LOW (ref >59)
GFR calc non Af Amer: 50 mL/min/{1.73_m2} — ABNORMAL LOW (ref >59)
GLOBULIN, TOTAL: 4.5 g/dL (ref 1.5–4.5)
Glucose: 74 mg/dL (ref 65–99)
POTASSIUM: 4.1 mmol/L (ref 3.5–5.2)
SODIUM: 139 mmol/L (ref 134–144)
Total Protein: 8.5 g/dL (ref 6.0–8.5)

## 2015-07-22 LAB — PROTEIN ELECTROPHORESIS, SERUM
A/G Ratio: 0.7 (ref 0.7–1.7)
ALPHA 1: 0.3 g/dL (ref 0.0–0.4)
ALPHA 2: 0.7 g/dL (ref 0.4–1.0)
Albumin ELP: 3.6 g/dL (ref 2.9–4.4)
Beta: 1.8 g/dL — ABNORMAL HIGH (ref 0.7–1.3)
Gamma Globulin: 2.2 g/dL — ABNORMAL HIGH (ref 0.4–1.8)
Globulin, Total: 4.9 g/dL — ABNORMAL HIGH (ref 2.2–3.9)

## 2015-07-22 LAB — VITAMIN B12: Vitamin B-12: 928 pg/mL (ref 211–946)

## 2015-07-22 LAB — PROTEIN ELECTROPHORESIS, URINE REFLEX
ALBUMIN ELP UR: 34.2 %
ALPHA-2-GLOBULIN, U: 4.6 %
Alpha-1-Globulin, U: 0.9 %
BETA GLOBULIN, U: 20.4 %
Gamma Globulin, U: 40 %
PROTEIN UR: 25.5 mg/dL

## 2015-07-22 LAB — HEMOGLOBIN A1C
Est. average glucose Bld gHb Est-mCnc: 114 mg/dL
Hgb A1c MFr Bld: 5.6 % (ref 4.8–5.6)

## 2015-07-22 LAB — MICROALBUMIN / CREATININE URINE RATIO
CREATININE, UR: 118.5 mg/dL
MICROALB/CREAT RATIO: 46.8 mg/g creat — ABNORMAL HIGH (ref 0.0–30.0)
MICROALBUM., U, RANDOM: 55.5 ug/mL

## 2015-07-22 LAB — LIPID PANEL W/O CHOL/HDL RATIO
CHOLESTEROL TOTAL: 171 mg/dL (ref 100–199)
HDL: 43 mg/dL (ref >39)
LDL CALC: 99 mg/dL (ref 0–99)
Triglycerides: 144 mg/dL (ref 0–149)
VLDL Cholesterol Cal: 29 mg/dL (ref 5–40)

## 2015-07-22 LAB — PSA: PROSTATE SPECIFIC AG, SERUM: 2.6 ng/mL (ref 0.0–4.0)

## 2015-07-22 LAB — METHYLMALONIC ACID, SERUM: Methylmalonic Acid: 200 nmol/L (ref 0–378)

## 2015-07-22 NOTE — Assessment & Plan Note (Signed)
Refer to [name]

## 2015-07-23 ENCOUNTER — Telehealth: Payer: Self-pay | Admitting: Family Medicine

## 2015-07-23 MED ORDER — ATORVASTATIN CALCIUM 40 MG PO TABS: 40.0000 mg | ORAL_TABLET | Freq: Every day | ORAL | Status: DC

## 2015-07-23 MED ORDER — METFORMIN HCL ER (MOD) 500 MG PO TB24: 500.0000 mg | ORAL_TABLET | Freq: Every day | ORAL | Status: DC

## 2015-07-23 MED ORDER — OMEPRAZOLE 40 MG PO CPDR: 40.0000 mg | DELAYED_RELEASE_CAPSULE | Freq: Every day | ORAL | Status: DC

## 2015-07-23 MED ORDER — GABAPENTIN 300 MG PO CAPS: 300.0000 mg | ORAL_CAPSULE | Freq: Three times a day (TID) | ORAL | Status: DC

## 2015-07-23 MED ORDER — RAMIPRIL 5 MG PO CAPS: 5.0000 mg | ORAL_CAPSULE | Freq: Every day | ORAL | Status: DC

## 2015-07-23 NOTE — Telephone Encounter (Signed)
I sent several over to pharmacy I need to ask you to contact the pharmacy by phone, though, and verify the glipizide and furosemide The glipizide does not come in a 1 mg strength, and I suspect it's another drug and he told you wrong The furosemide doesn't have a frequency (daily, daily PRN, BID, etc.) Thanks!

## 2015-07-23 NOTE — Telephone Encounter (Signed)
Pt states he was here on Friday and was supposed to get all his meds sent to [facility] in [place]. Pt states his medications are not there. Please advise pt.

## 2015-07-24 ENCOUNTER — Telehealth: Payer: Self-pay | Admitting: Family Medicine

## 2015-07-24 MED ORDER — METFORMIN HCL ER 500 MG PO TB24: 500.0000 mg | ORAL_TABLET | Freq: Every day | ORAL | Status: DC

## 2015-07-24 MED ORDER — FUROSEMIDE 20 MG PO TABS: 20.0000 mg | ORAL_TABLET | Freq: Every day | ORAL | Status: DC

## 2015-07-24 MED ORDER — GLIPIZIDE 5 MG PO TABS: 5.0000 mg | ORAL_TABLET | Freq: Every day | ORAL | Status: DC

## 2015-07-24 NOTE — Telephone Encounter (Signed)
[facility] called wanting a [facility] referral for vascular dr. Dr Alberteen Spindleline [facility]

## 2015-07-24 NOTE — Telephone Encounter (Signed)
Ok so he was on Glipizide 5mg  1 1/2 tab in am And they have not refilled the fourosemide since 2013 and the last they filled was 20mg  qd And he has been on glucophage ER please write new rx

## 2015-07-24 NOTE — Telephone Encounter (Signed)
Thank you.  I updated the med list.

## 2015-07-31 ENCOUNTER — Telehealth: Payer: Self-pay | Admitting: Cardiology

## 2015-07-31 NOTE — Telephone Encounter (Signed)
Patient is scheduled next week for new patient appt with Dr. Alvino ChapelIngal .  Per referral for "artificial pacemaker".  Not sure if this needs to be r/s with klein or if it is ok to see Ingal as patient has cad and other card. Hx .  Next available with Graciela HusbandsKlein in [place] is 08/15.

## 2015-07-31 NOTE — Telephone Encounter (Signed)
Patient scheduled to see Dr. Graciela HusbandsKlein- 08/12/15.

## 2015-07-31 NOTE — Telephone Encounter (Signed)
Patient has pacemaker that has not been checked. Referral is for that so [facility] to make her aware of patient.

## 2015-08-04 ENCOUNTER — Ambulatory Visit (INDEPENDENT_AMBULATORY_CARE_PROVIDER_SITE_OTHER): Payer: Medicare PPO | Admitting: Family Medicine

## 2015-08-04 ENCOUNTER — Emergency Department: Payer: Medicare PPO

## 2015-08-04 ENCOUNTER — Emergency Department
Admission: EM | Admit: 2015-08-04 | Discharge: 2015-08-04 | Disposition: A | Discharge status: Home / Self Care | Payer: Medicare PPO | Attending: Emergency Medicine | Admitting: Emergency Medicine

## 2015-08-04 ENCOUNTER — Encounter: Payer: Self-pay | Admitting: Family Medicine

## 2015-08-04 VITALS — BP 120/82 | HR 94 | Temp 97.6°F | Resp 18 | Wt 197.0 lb

## 2015-08-04 DIAGNOSIS — E11621 Type 2 diabetes mellitus with foot ulcer: Secondary | ICD-10-CM | POA: Diagnosis not present

## 2015-08-04 DIAGNOSIS — I2510 Atherosclerotic heart disease of native coronary artery without angina pectoris: Secondary | ICD-10-CM | POA: Insufficient documentation

## 2015-08-04 DIAGNOSIS — Z87891 Personal history of nicotine dependence: Secondary | ICD-10-CM | POA: Insufficient documentation

## 2015-08-04 DIAGNOSIS — R0602 Shortness of breath: Secondary | ICD-10-CM | POA: Diagnosis not present

## 2015-08-04 DIAGNOSIS — D892 Hypergammaglobulinemia, unspecified: Secondary | ICD-10-CM | POA: Diagnosis not present

## 2015-08-04 DIAGNOSIS — Z86711 Personal history of pulmonary embolism: Secondary | ICD-10-CM | POA: Diagnosis not present

## 2015-08-04 DIAGNOSIS — M1990 Unspecified osteoarthritis, unspecified site: Secondary | ICD-10-CM | POA: Diagnosis not present

## 2015-08-04 DIAGNOSIS — E785 Hyperlipidemia, unspecified: Secondary | ICD-10-CM | POA: Diagnosis not present

## 2015-08-04 DIAGNOSIS — Z951 Presence of aortocoronary bypass graft: Secondary | ICD-10-CM | POA: Diagnosis not present

## 2015-08-04 DIAGNOSIS — L97509 Non-pressure chronic ulcer of other part of unspecified foot with unspecified severity: Secondary | ICD-10-CM | POA: Diagnosis not present

## 2015-08-04 DIAGNOSIS — L97519 Non-pressure chronic ulcer of other part of right foot with unspecified severity: Secondary | ICD-10-CM | POA: Diagnosis not present

## 2015-08-04 DIAGNOSIS — I10 Essential (primary) hypertension: Secondary | ICD-10-CM | POA: Insufficient documentation

## 2015-08-04 DIAGNOSIS — Z79899 Other long term (current) drug therapy: Secondary | ICD-10-CM | POA: Diagnosis not present

## 2015-08-04 HISTORY — DX: Illiteracy and low-level literacy: Z55.0

## 2015-08-04 LAB — CBC
HCT: 42.2 % (ref 40.0–52.0)
HEMOGLOBIN: 14.3 g/dL (ref 13.0–18.0)
MCH: 29.6 pg (ref 26.0–34.0)
MCHC: 33.9 g/dL (ref 32.0–36.0)
MCV: 87.5 fL (ref 80.0–100.0)
PLATELETS: 248 10*3/uL (ref 150–440)
RBC: 4.83 MIL/uL (ref 4.40–5.90)
RDW: 17.5 % — ABNORMAL HIGH (ref 11.5–14.5)
WBC: 7.8 10*3/uL (ref 3.8–10.6)

## 2015-08-04 LAB — COMPREHENSIVE METABOLIC PANEL
ALT: 15 U/L — AB (ref 17–63)
AST: 25 U/L (ref 15–41)
Albumin: 4.2 g/dL (ref 3.5–5.0)
Alkaline Phosphatase: 104 U/L (ref 38–126)
Anion gap: 9 (ref 5–15)
BUN: 15 mg/dL (ref 6–20)
CHLORIDE: 105 mmol/L (ref 101–111)
CO2: 23 mmol/L (ref 22–32)
CREATININE: 1.3 mg/dL — AB (ref 0.61–1.24)
Calcium: 10 mg/dL (ref 8.9–10.3)
GFR calc non Af Amer: 51 mL/min — ABNORMAL LOW (ref >60)
GFR, EST AFRICAN AMERICAN: 59 mL/min — AB (ref >60)
Glucose, Bld: 108 mg/dL — ABNORMAL HIGH (ref 65–99)
POTASSIUM: 3.5 mmol/L (ref 3.5–5.1)
SODIUM: 137 mmol/L (ref 135–145)
Total Bilirubin: 0.9 mg/dL (ref 0.3–1.2)
Total Protein: 9.4 g/dL — ABNORMAL HIGH (ref 6.5–8.1)

## 2015-08-04 LAB — FIBRIN DERIVATIVES D-DIMER (ARMC ONLY): Fibrin derivatives D-dimer (ARMC): 1842 — ABNORMAL HIGH (ref 0–499)

## 2015-08-04 MED ORDER — IOPAMIDOL (ISOVUE-370) INJECTION 76%
75.0000 mL | Freq: Once | INTRAVENOUS | Status: AC | PRN
  Administered 2015-08-04: 75 mL via INTRAVENOUS

## 2015-08-04 NOTE — ED Notes (Signed)
Pt requesting food and drink.  Pt informed he needs to see md first.

## 2015-08-04 NOTE — ED Notes (Signed)
Pt discharged to home.  Discharge instructions reviewed.  Verbalized understanding.  No questions or concerns at this time.  Teach back verified.  Pt in NAD.  No items left in ED.   
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
## 2015-08-04 NOTE — Assessment & Plan Note (Signed)
Explained that he will need to be seeing a heme-onc doctor for the abnormal proteins

## 2015-08-04 NOTE — Assessment & Plan Note (Signed)
Patient had a blood clot in his lungs a month ago; in [place]; not on blood thinner; progressive dyspnea

## 2015-08-04 NOTE — Patient Instructions (Addendum)
STOP the glipizide (Glucotrol); this is a pill for diabetes STOP the metformin (Glucophage); this is also a pill for diabetes Avoid sugary drinks Try to drink 48 - 64 ounces of fluid a day (total decaffeinated drinks) Limit coffee No sodas No orange juice See the podiatrist on Wednesday Keep those sores on your feet clean and dressed If any signs of infection, red streaks going up the feet or legs, or any fevers, then call doctor or go to urgent care or ER GO now to the Emergency Department and have them check you out for a blood clot in your right leg or lungs

## 2015-08-04 NOTE — Progress Notes (Signed)
BP 120/82   Pulse 94   Temp 97.6 F (36.4 C) (Oral)   Resp 18   Wt 197 lb (89.4 kg)   SpO2 96%   BMI 26.72 kg/m    Subjective:    Patient ID: [name], male    DOB: 12-06-1934, 81 y.o.   MRN: [id number]  HPI: [name] is a 81 y.o. male  Chief Complaint  Patient presents with  . Follow-up    2 weeks  . Dizziness   The sores have gotten worse on the right foot He has some swelling in his legs a little bit today; he got short winded about a month; getting worse every day; no chest pain; he was in [place] and he has a hx of PE; two or three of them; last blood clot was 1 month ago; not on blood thinners, they took him off of it but he doesn't know why they took him off of the blood thinner; he was not bleeding; he had a blood clot in the right leg; he says we can get all the information from his doctor in [place] (after 5 pm right now) Gets dizzy if standing up too fast; has to hold on to something; not getting enough to eat and drink he thinks; he drinks 3 cups of coffee a day; some days he doesn't eat good He will go to see his heart doctor on June 27th at 9:00 am for his pacemaker check He denied letting me call the ambulance  Relevant past medical, surgical, family and social history reviewed Past Medical History:  Diagnosis Date  . Arthritis   . CAD (coronary artery disease)   . COPD (chronic obstructive pulmonary disease) (HCC)   . Diabetes mellitus without complication (HCC)   . GERD (gastroesophageal reflux disease)   . History of blood clots   . 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 Hyperlipidemia   . Hypertension   . Literacy level of illiterate   . Osteomyelitis (HCC)   . PN (peripheral neuropathy) (HCC)    Past Surgical History:  Procedure Laterality Date  . amputation 4th finger Left   . APPENDECTOMY    . CLAVICLE SURGERY  2012   open reduction and internal fixation of left clavicle  . CORONARY ARTERY BYPASS GRAFT  2002  . FOOT SURGERY      . PACEMAKER INSERTION     Family History  Problem Relation Age of Onset  . Cancer Mother     spine  . Hypertension Mother   . Mental illness Mother   . Cancer Father     lung  . Asthma Father   . Allergic rhinitis Father   . Arthritis Father    Social History  Substance Use Topics  . Smoking status: Former Smoker    Packs/day: 0.25    Types: Cigarettes, Cigars    Quit date: 10/18/1979  . Smokeless tobacco: Never Used  . Alcohol use No  He has 9 sons and 8 daughters; [id number] grandchildren and 6 great-grandchildren  Interim medical history since last visit reviewed. Allergies and medications reviewed  Review of Systems Per HPI unless specifically indicated above     Objective:    BP 120/82   Pulse 94   Temp 97.6 F (36.4 C) (Oral)   Resp 18   Wt 197 lb (89.4 kg)   SpO2 96%   BMI 26.72 kg/m   Wt Readings from Last 3 Encounters:  09/23/15 210 lb (95.3 kg)  09/18/15 210  lb (95.3 kg)  08/12/15 192 lb 1.6 oz (87.1 kg)    Physical Exam  Constitutional: He appears well-developed and well-nourished. No distress.  HENT:  Head: Normocephalic and atraumatic.  Cardiovascular: Normal rate and regular rhythm.   Pulmonary/Chest: Effort normal and breath sounds normal. He has no wheezes. He has no rales.  Abdominal: He exhibits no distension.  Musculoskeletal: Edema: right calf measures 1 inch circumference larger than the left calf.  Skin: No pallor.  Psychiatric: His mood appears not anxious. He does not exhibit a depressed mood.   Results for orders placed or performed in visit on 07/18/15  Hemoglobin A1c  Result Value Ref Range   Hgb A1c MFr Bld 5.6 4.8 - 5.6 %   Est. average glucose Bld gHb Est-mCnc 114 mg/dL  Comprehensive metabolic panel  Result Value Ref Range   Glucose 74 65 - 99 mg/dL   BUN 16 8 - 27 mg/dL   Creatinine, Ser 1.61 (H) 0.76 - 1.27 mg/dL   GFR calc non Af Amer 50 (L) >59 mL/min/1.73   GFR calc Af Amer 58 (L) >59 mL/min/1.73   BUN/Creatinine Ratio  12 10 - 24   Sodium 139 134 - 144 mmol/L   Potassium 4.1 3.5 - 5.2 mmol/L   Chloride 101 96 - 106 mmol/L   CO2 21 18 - 29 mmol/L   Calcium 10.4 (H) 8.6 - 10.2 mg/dL   Total Protein 8.5 6.0 - 8.5 g/dL   Albumin 4.0 3.5 - 4.8 g/dL   Globulin, Total 4.5 1.5 - 4.5 g/dL   Albumin/Globulin Ratio 0.9 (L) 1.2 - 2.2   Bilirubin Total 0.5 0.0 - 1.2 mg/dL   Alkaline Phosphatase 114 39 - 117 IU/L   AST 20 0 - 40 IU/L   ALT 11 0 - 44 IU/L  Lipid Panel w/o Chol/HDL Ratio  Result Value Ref Range   Cholesterol, Total 171 100 - 199 mg/dL   Triglycerides 096 0 - 149 mg/dL   HDL 43 >04 mg/dL   VLDL Cholesterol Cal 29 5 - 40 mg/dL   LDL Calculated 99 0 - 99 mg/dL  Protein Electrophoresis, (serum)  Result Value Ref Range   Albumin ELP 3.6 2.9 - 4.4 g/dL   Alpha 1 0.3 0.0 - 0.4 g/dL   Alpha 2 0.7 0.4 - 1.0 g/dL   Beta 1.8 (H) 0.7 - 1.3 g/dL   Gamma Globulin 2.2 (H) 0.4 - 1.8 g/dL   M-Spike, % Not Observed Not Observed g/dL   GLOBULIN, TOTAL 4.9 (H) 2.2 - 3.9 g/dL   A/G Ratio 0.7 0.7 - 1.7   PLEASE NOTE: Comment    Interpretation: Comment   Protein Electrophoresis, Urine Rflx.  Result Value Ref Range   Protein, Ur 25.5 Not Estab. mg/dL   Albumin ELP, Urine 54.0 %   Alpha-1-Globulin, U 0.9 %   Alpha-2-Globulin, U 4.6 %   Beta Globulin, U 20.4 %   Gamma Globulin, U 40.0 %   M Component, Ur Not Observed Not Observed %   PLEASE NOTE: Comment   Vitamin B12  Result Value Ref Range   Vitamin B-12 928 211 - 946 pg/mL  Methylmalonic Acid  Result Value Ref Range   Methylmalonic Acid 200 0 - 378 nmol/L  Microalbumin / creatinine urine ratio  Result Value Ref Range   Creatinine, Urine 118.5 Not Estab. mg/dL   Microalbum.,U,Random 55.5 Not Estab. ug/mL   MICROALB/CREAT RATIO 46.8 (H) 0.0 - 30.0 mg/g creat  PSA  Result Value Ref  Range   Prostate Specific Ag, Serum 2.6 0.0 - 4.0 ng/mL      Assessment & Plan:   Problem List Items Addressed This Visit      Endocrine   Diabetic foot ulcer  associated with type 2 diabetes mellitus (HCC) (Chronic)    Continue to see podiatrist for ulceration; see AVS for instructions on medicines        Other   Hypergammaglobulinemia    Explained that he will need to be seeing a heme-onc doctor for the abnormal proteins      History of pulmonary embolism    Patient had a blood clot in his lungs a month ago; in [place]; not on blood thinner; progressive dyspnea       Other Visit Diagnoses    SOB (shortness of breath) on exertion    -  Primary   concerning for recurrent PE; could also have pacemaker complication; sending patient to ER; he declined ambulance/911      Follow up plan: No Follow-up on file.  An after-visit summary was printed and given to the patient at check-out.  Please see the patient instructions which may contain other information and recommendations beyond what is mentioned above in the assessment and plan.  [phone]

## 2015-08-04 NOTE — ED Notes (Signed)
Sent here by PCP to be checked for a blood clot in right leg or lungs.  Due to feeling SOB x 2 months.  Patient has history of PE.

## 2015-08-04 NOTE — Discharge Instructions (Signed)
Your exam was reassuring today with no evidence of pulmonary embolism nor DVT in your legs.  Please follow up with her cardiologist and primary care doctor as scheduled.   Shortness of Breath Shortness of breath means you have trouble breathing. Shortness of breath needs medical care right away. HOME CARE   Do not smoke.  Avoid being around chemicals or things (paint fumes, dust) that may bother your breathing.  Rest as needed. Slowly begin your normal activities.  Only take medicines as told by your doctor.  Keep all doctor visits as told. GET HELP RIGHT AWAY IF:   Your shortness of breath gets worse.  You feel lightheaded, pass out (faint), or have a cough that is not helped by medicine.  You cough up blood.  You have pain with breathing.  You have pain in your chest, arms, shoulders, or belly (abdomen).  You have a fever.  You cannot walk up stairs or exercise the way you normally do.  You do not get better in the time expected.  You have a hard time doing normal activities even with rest.  You have problems with your medicines.  You have any new symptoms. MAKE SURE YOU:  Understand these instructions.  Will watch your condition.  Will get help right away if you are not doing well or get worse.   This information is not intended to replace advice given to you by your health care provider. Make sure you discuss any questions you have with your health care provider.   Document Released: 07/21/2007 Document Revised: 02/06/2013 Document Reviewed: 04/19/2011 Elsevier Interactive Patient Education [employer]2016 Elsevier Inc.

## 2015-08-04 NOTE — ED Notes (Addendum)
Denies SOB or CP at this time. Pt alert and oriented X4, active, cooperative, pt in NAD. RR even and unlabored, color WNL.  Pt laughing and joking at time of triage.  Pt hx of blood clots, pt was taken off of his blood thinner.

## 2015-08-04 NOTE — ED Notes (Signed)
Took the patient a food tray and some water.

## 2015-08-05 ENCOUNTER — Ambulatory Visit: Payer: Medicare PPO | Admitting: Cardiology

## 2015-08-09 ENCOUNTER — Inpatient Hospital Stay
Admission: EM | Admit: 2015-08-09 | Discharge: 2015-08-12 | DRG: 315 | Disposition: A | Discharge status: Home / Self Care | Payer: Medicare PPO | Attending: Internal Medicine | Admitting: Internal Medicine

## 2015-08-09 ENCOUNTER — Other Ambulatory Visit: Payer: Self-pay

## 2015-08-09 ENCOUNTER — Encounter: Payer: Self-pay | Admitting: *Deleted

## 2015-08-09 ENCOUNTER — Emergency Department: Payer: Medicare PPO

## 2015-08-09 DIAGNOSIS — Z951 Presence of aortocoronary bypass graft: Secondary | ICD-10-CM | POA: Diagnosis not present

## 2015-08-09 DIAGNOSIS — L97519 Non-pressure chronic ulcer of other part of right foot with unspecified severity: Secondary | ICD-10-CM | POA: Diagnosis present

## 2015-08-09 DIAGNOSIS — Z825 Family history of asthma and other chronic lower respiratory diseases: Secondary | ICD-10-CM | POA: Diagnosis not present

## 2015-08-09 DIAGNOSIS — R002 Palpitations: Secondary | ICD-10-CM | POA: Diagnosis present

## 2015-08-09 DIAGNOSIS — M1990 Unspecified osteoarthritis, unspecified site: Secondary | ICD-10-CM | POA: Diagnosis present

## 2015-08-09 DIAGNOSIS — Z818 Family history of other mental and behavioral disorders: Secondary | ICD-10-CM

## 2015-08-09 DIAGNOSIS — N183 Chronic kidney disease, stage 3 (moderate): Secondary | ICD-10-CM | POA: Diagnosis present

## 2015-08-09 DIAGNOSIS — Z87820 Personal history of traumatic brain injury: Secondary | ICD-10-CM

## 2015-08-09 DIAGNOSIS — E1122 Type 2 diabetes mellitus with diabetic chronic kidney disease: Secondary | ICD-10-CM | POA: Diagnosis present

## 2015-08-09 DIAGNOSIS — I129 Hypertensive chronic kidney disease with stage 1 through stage 4 chronic kidney disease, or unspecified chronic kidney disease: Secondary | ICD-10-CM | POA: Diagnosis present

## 2015-08-09 DIAGNOSIS — Z8249 Family history of ischemic heart disease and other diseases of the circulatory system: Secondary | ICD-10-CM | POA: Diagnosis not present

## 2015-08-09 DIAGNOSIS — J449 Chronic obstructive pulmonary disease, unspecified: Secondary | ICD-10-CM | POA: Diagnosis present

## 2015-08-09 DIAGNOSIS — F39 Unspecified mood [affective] disorder: Secondary | ICD-10-CM | POA: Diagnosis present

## 2015-08-09 DIAGNOSIS — Z809 Family history of malignant neoplasm, unspecified: Secondary | ICD-10-CM

## 2015-08-09 DIAGNOSIS — E876 Hypokalemia: Secondary | ICD-10-CM | POA: Diagnosis present

## 2015-08-09 DIAGNOSIS — Z885 Allergy status to narcotic agent status: Secondary | ICD-10-CM | POA: Diagnosis not present

## 2015-08-09 DIAGNOSIS — E785 Hyperlipidemia, unspecified: Secondary | ICD-10-CM | POA: Diagnosis present

## 2015-08-09 DIAGNOSIS — Z79899 Other long term (current) drug therapy: Secondary | ICD-10-CM | POA: Diagnosis not present

## 2015-08-09 DIAGNOSIS — I472 Ventricular tachycardia: Secondary | ICD-10-CM | POA: Diagnosis present

## 2015-08-09 DIAGNOSIS — Z89022 Acquired absence of left finger(s): Secondary | ICD-10-CM

## 2015-08-09 DIAGNOSIS — L97529 Non-pressure chronic ulcer of other part of left foot with unspecified severity: Secondary | ICD-10-CM | POA: Diagnosis present

## 2015-08-09 DIAGNOSIS — T82118A Breakdown (mechanical) of other cardiac electronic device, initial encounter: Secondary | ICD-10-CM

## 2015-08-09 DIAGNOSIS — I255 Ischemic cardiomyopathy: Secondary | ICD-10-CM | POA: Diagnosis present

## 2015-08-09 DIAGNOSIS — I951 Orthostatic hypotension: Secondary | ICD-10-CM | POA: Diagnosis present

## 2015-08-09 DIAGNOSIS — E11621 Type 2 diabetes mellitus with foot ulcer: Secondary | ICD-10-CM | POA: Diagnosis present

## 2015-08-09 DIAGNOSIS — I495 Sick sinus syndrome: Secondary | ICD-10-CM | POA: Diagnosis present

## 2015-08-09 DIAGNOSIS — K219 Gastro-esophageal reflux disease without esophagitis: Secondary | ICD-10-CM | POA: Diagnosis present

## 2015-08-09 DIAGNOSIS — Z9889 Other specified postprocedural states: Secondary | ICD-10-CM

## 2015-08-09 DIAGNOSIS — E860 Dehydration: Secondary | ICD-10-CM | POA: Diagnosis present

## 2015-08-09 DIAGNOSIS — I2510 Atherosclerotic heart disease of native coronary artery without angina pectoris: Secondary | ICD-10-CM | POA: Diagnosis present

## 2015-08-09 DIAGNOSIS — Z8261 Family history of arthritis: Secondary | ICD-10-CM | POA: Diagnosis not present

## 2015-08-09 DIAGNOSIS — F02818 Dementia in other diseases classified elsewhere, unspecified severity, with other behavioral disturbance: Secondary | ICD-10-CM

## 2015-08-09 DIAGNOSIS — F0281 Dementia in other diseases classified elsewhere with behavioral disturbance: Secondary | ICD-10-CM

## 2015-08-09 DIAGNOSIS — Y712 Prosthetic and other implants, materials and accessory cardiovascular devices associated with adverse incidents: Secondary | ICD-10-CM | POA: Diagnosis present

## 2015-08-09 DIAGNOSIS — S069X9S Unspecified intracranial injury with loss of consciousness of unspecified duration, sequela: Secondary | ICD-10-CM

## 2015-08-09 DIAGNOSIS — Z87891 Personal history of nicotine dependence: Secondary | ICD-10-CM

## 2015-08-09 DIAGNOSIS — Z95810 Presence of automatic (implantable) cardiac defibrillator: Secondary | ICD-10-CM

## 2015-08-09 DIAGNOSIS — R55 Syncope and collapse: Secondary | ICD-10-CM

## 2015-08-09 DIAGNOSIS — Z950 Presence of cardiac pacemaker: Secondary | ICD-10-CM | POA: Diagnosis not present

## 2015-08-09 DIAGNOSIS — T82199A Other mechanical complication of unspecified cardiac device, initial encounter: Principal | ICD-10-CM | POA: Diagnosis present

## 2015-08-09 DIAGNOSIS — R42 Dizziness and giddiness: Secondary | ICD-10-CM

## 2015-08-09 LAB — PROTIME-INR
INR: 1.09
Prothrombin Time: 14.3 seconds (ref 11.4–15.0)

## 2015-08-09 LAB — COMPREHENSIVE METABOLIC PANEL
ALT: 13 U/L — ABNORMAL LOW (ref 17–63)
AST: 29 U/L (ref 15–41)
Albumin: 3.7 g/dL (ref 3.5–5.0)
Alkaline Phosphatase: 102 U/L (ref 38–126)
Anion gap: 10 (ref 5–15)
BILIRUBIN TOTAL: 0.8 mg/dL (ref 0.3–1.2)
BUN: 10 mg/dL (ref 6–20)
CHLORIDE: 109 mmol/L (ref 101–111)
CO2: 19 mmol/L — ABNORMAL LOW (ref 22–32)
CREATININE: 1.24 mg/dL (ref 0.61–1.24)
Calcium: 8.8 mg/dL — ABNORMAL LOW (ref 8.9–10.3)
GFR, EST NON AFRICAN AMERICAN: 54 mL/min — AB (ref >60)
Glucose, Bld: 201 mg/dL — ABNORMAL HIGH (ref 65–99)
POTASSIUM: 3.1 mmol/L — AB (ref 3.5–5.1)
SODIUM: 138 mmol/L (ref 135–145)
TOTAL PROTEIN: 8.6 g/dL — AB (ref 6.5–8.1)

## 2015-08-09 LAB — CBC
HCT: 37.5 % — ABNORMAL LOW (ref 40.0–52.0)
Hemoglobin: 12.9 g/dL — ABNORMAL LOW (ref 13.0–18.0)
MCH: 29.5 pg (ref 26.0–34.0)
MCHC: 34.3 g/dL (ref 32.0–36.0)
MCV: 85.9 fL (ref 80.0–100.0)
PLATELETS: 215 10*3/uL (ref 150–440)
RBC: 4.37 MIL/uL — AB (ref 4.40–5.90)
RDW: 16.9 % — AB (ref 11.5–14.5)
WBC: 7 10*3/uL (ref 3.8–10.6)

## 2015-08-09 LAB — TSH: TSH: 0.243 u[IU]/mL — ABNORMAL LOW (ref 0.350–4.500)

## 2015-08-09 LAB — APTT: aPTT: 29 seconds (ref 24–36)

## 2015-08-09 LAB — TROPONIN I: Troponin I: <0.03 ng/mL (ref <0.031)

## 2015-08-09 MED ORDER — GABAPENTIN 300 MG PO CAPS
300.0000 mg | ORAL_CAPSULE | Freq: Three times a day (TID) | ORAL | Status: DC
  Administered 2015-08-09 – 2015-08-12 (×7): 300 mg via ORAL
  Filled 2015-08-09 (×7): qty 1

## 2015-08-09 MED ORDER — MORPHINE SULFATE (PF) 2 MG/ML IV SOLN: 1.0000 mg | INTRAVENOUS | Status: DC | PRN

## 2015-08-09 MED ORDER — VITAMIN B-12 1000 MCG PO TABS
500.0000 ug | ORAL_TABLET | Freq: Every day | ORAL | Status: DC
  Administered 2015-08-10 – 2015-08-12 (×3): 500 ug via ORAL
  Filled 2015-08-09 (×3): qty 1

## 2015-08-09 MED ORDER — ENOXAPARIN SODIUM 40 MG/0.4ML ~~LOC~~ SOLN
40.0000 mg | SUBCUTANEOUS | Status: DC
  Administered 2015-08-09 – 2015-08-11 (×3): 40 mg via SUBCUTANEOUS
  Filled 2015-08-09 (×3): qty 0.4

## 2015-08-09 MED ORDER — ACETAMINOPHEN 325 MG PO TABS: 650.0000 mg | ORAL_TABLET | Freq: Four times a day (QID) | ORAL | Status: DC | PRN

## 2015-08-09 MED ORDER — ASPIRIN EC 81 MG PO TBEC
81.0000 mg | DELAYED_RELEASE_TABLET | Freq: Every day | ORAL | Status: DC
  Administered 2015-08-10 – 2015-08-12 (×3): 81 mg via ORAL
  Filled 2015-08-09 (×3): qty 1

## 2015-08-09 MED ORDER — ATORVASTATIN CALCIUM 20 MG PO TABS
40.0000 mg | ORAL_TABLET | Freq: Every day | ORAL | Status: DC
  Administered 2015-08-09 – 2015-08-11 (×3): 40 mg via ORAL
  Filled 2015-08-09 (×3): qty 2

## 2015-08-09 MED ORDER — DOCUSATE SODIUM 100 MG PO CAPS
100.0000 mg | ORAL_CAPSULE | Freq: Two times a day (BID) | ORAL | Status: DC
  Administered 2015-08-09 – 2015-08-12 (×6): 100 mg via ORAL
  Filled 2015-08-09 (×6): qty 1

## 2015-08-09 MED ORDER — FUROSEMIDE 20 MG PO TABS
20.0000 mg | ORAL_TABLET | Freq: Every day | ORAL | Status: DC
  Administered 2015-08-10: 20 mg via ORAL
  Filled 2015-08-09: qty 1

## 2015-08-09 MED ORDER — SODIUM CHLORIDE 0.9% FLUSH
3.0000 mL | Freq: Two times a day (BID) | INTRAVENOUS | Status: DC
  Administered 2015-08-09 – 2015-08-12 (×5): 3 mL via INTRAVENOUS

## 2015-08-09 MED ORDER — ONDANSETRON HCL 4 MG PO TABS: 4.0000 mg | ORAL_TABLET | Freq: Four times a day (QID) | ORAL | Status: DC | PRN

## 2015-08-09 MED ORDER — ACETAMINOPHEN 650 MG RE SUPP: 650.0000 mg | Freq: Four times a day (QID) | RECTAL | Status: DC | PRN

## 2015-08-09 MED ORDER — PANTOPRAZOLE SODIUM 40 MG PO TBEC
40.0000 mg | DELAYED_RELEASE_TABLET | Freq: Every day | ORAL | Status: DC
  Administered 2015-08-10 – 2015-08-12 (×3): 40 mg via ORAL
  Filled 2015-08-09 (×3): qty 1

## 2015-08-09 MED ORDER — SODIUM CHLORIDE 0.9 % IV BOLUS (SEPSIS)
1000.0000 mL | Freq: Once | INTRAVENOUS | Status: AC
  Administered 2015-08-09: 1000 mL via INTRAVENOUS

## 2015-08-09 MED ORDER — POTASSIUM CHLORIDE CRYS ER 20 MEQ PO TBCR
40.0000 meq | EXTENDED_RELEASE_TABLET | ORAL | Status: AC
  Administered 2015-08-10 (×2): 40 meq via ORAL
  Filled 2015-08-09 (×2): qty 2

## 2015-08-09 MED ORDER — RAMIPRIL 5 MG PO CAPS
5.0000 mg | ORAL_CAPSULE | Freq: Every day | ORAL | Status: DC
  Administered 2015-08-10 – 2015-08-12 (×3): 5 mg via ORAL
  Filled 2015-08-09 (×3): qty 1

## 2015-08-09 MED ORDER — FISH OIL 600 MG PO CAPS: 300.0000 mg | ORAL_CAPSULE | Freq: Two times a day (BID) | ORAL | Status: DC

## 2015-08-09 MED ORDER — ONDANSETRON HCL 4 MG/2ML IJ SOLN: 4.0000 mg | Freq: Four times a day (QID) | INTRAMUSCULAR | Status: DC | PRN

## 2015-08-09 NOTE — ED Notes (Signed)
After reading ekg, dr Inocencio Homesgayle states pt to a room stat.  Pt acuity changed to level 2.  Pt alert. No acute resp distress.  Skin warm and dry.

## 2015-08-09 NOTE — H&P (Signed)
[name] is an 81 y.o. male.   Chief Complaint: Dizziness HPI: The patient presents emergency department complaining of dizziness. He states that he's had these episodes intermittently for more than one week. In the emergency department the patient was found to have some wide complex tachycardia on telemetry. Past medical history significant for CABG and pacemaker placement but it appears that the pacemaker is not working at this time. Patient denies chest pain, nausea, vomiting or diaphoresis. However, he admits to lightheadedness and palpitations at times. He is comfortable right now. Due to his erratic heart beat emergency department staff called for admission.  Past Medical History  Diagnosis Date  . COPD (chronic obstructive pulmonary disease) ([place])   . Diabetes mellitus without complication ([place])   . History of blood clots   . GERD (gastroesophageal reflux disease)   . Hyperlipidemia   . Hypertension   . CAD (coronary artery disease)   . Osteomyelitis ([place])   . Arthritis   . PN (peripheral neuropathy) ([place])   . 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 Literacy level of illiterate     Past Surgical History  Procedure Laterality Date  . Pacemaker insertion    . Appendectomy    . Coronary artery bypass graft  2002  . Clavicle surgery  2012    open reduction and internal fixation of left clavicle  . Amputation 4th finger Left   . Foot surgery      Family History  Problem Relation Age of Onset  . Cancer Mother     spine  . Hypertension Mother   . Mental illness Mother   . Cancer Father     lung  . Asthma Father   . Allergic rhinitis Father   . Arthritis Father    Social History:  reports that he quit smoking about 35 years ago. His smoking use included Cigarettes and Cigars. He smoked 0.25 packs per day. He has never used smokeless tobacco. He reports that he does not drink alcohol or use illicit drugs.  Allergies:  Allergies  Allergen Reactions  .  Oxycodone-Acetaminophen Other (See Comments)    Causes the pt to be very irritable, says he can take this  . Percocet [Oxycodone-Acetaminophen] Other (See Comments)    Reaction:  Irritability     Medications Prior to Admission  Medication Sig Dispense Refill  . atorvastatin (LIPITOR) 40 MG tablet Take 1 tablet (40 mg total) by mouth at bedtime. 30 tablet 1  . furosemide (LASIX) 20 MG tablet Take 1 tablet (20 mg total) by mouth daily. 30 tablet 1  . gabapentin (NEURONTIN) 300 MG capsule Take 1 capsule (300 mg total) by mouth 3 (three) times daily. 90 capsule 1  . Omega-3 Fatty Acids (FISH OIL) 600 MG CAPS Take 300 mg by mouth 2 (two) times daily.    [name] omeprazole (PRILOSEC) 40 MG capsule Take 1 capsule (40 mg total) by mouth daily. 30 capsule 1  . OVER THE COUNTER MEDICATION Take 2 capsules by mouth daily. Pt takes OmegaXL.    . ramipril (ALTACE) 5 MG capsule Take 1 capsule (5 mg total) by mouth daily. 30 capsule 1  . vitamin B-12 (CYANOCOBALAMIN) 500 MCG tablet Take 500 mcg by mouth daily.      Results for orders placed or performed during the hospital encounter of 08/09/15 (from the past 48 hour(s))  CBC     Status: Abnormal   Collection Time: 08/09/15  4:05 PM  Result Value Ref Range   WBC 7.0 3.8 -  10.6 K/uL   RBC 4.37 (L) 4.40 - 5.90 MIL/uL   Hemoglobin 12.9 (L) 13.0 - 18.0 g/dL   HCT 37.5 (L) 40.0 - 52.0 %   MCV 85.9 80.0 - 100.0 fL   MCH 29.5 26.0 - 34.0 pg   MCHC 34.3 32.0 - 36.0 g/dL   RDW 16.9 (H) 11.5 - 14.5 %   Platelets 215 150 - 440 K/uL  Comprehensive metabolic panel     Status: Abnormal   Collection Time: 08/09/15  4:05 PM  Result Value Ref Range   Sodium 138 135 - 145 mmol/L   Potassium 3.1 (L) 3.5 - 5.1 mmol/L   Chloride 109 101 - 111 mmol/L   CO2 19 (L) 22 - 32 mmol/L   Glucose, Bld 201 (H) 65 - 99 mg/dL   BUN 10 6 - 20 mg/dL   Creatinine, Ser 1.24 0.61 - 1.24 mg/dL   Calcium 8.8 (L) 8.9 - 10.3 mg/dL   Total Protein 8.6 (H) 6.5 - 8.1 g/dL   Albumin 3.7 3.5  - 5.0 g/dL   AST 29 15 - 41 U/L   ALT 13 (L) 17 - 63 U/L   Alkaline Phosphatase 102 38 - 126 U/L   Total Bilirubin 0.8 0.3 - 1.2 mg/dL   GFR calc non Af Amer 54 (L) >60 mL/min   GFR calc Af Amer >60 >60 mL/min    Comment: (NOTE) The eGFR has been calculated using the CKD EPI equation. This calculation has not been validated in all clinical situations. eGFR's persistently <60 mL/min signify possible Chronic Kidney Disease.    Anion gap 10 5 - 15  Troponin I     Status: None   Collection Time: 08/09/15  4:05 PM  Result Value Ref Range   Troponin I <0.03 <0.031 ng/mL    Comment:        NO INDICATION OF MYOCARDIAL INJURY.   Protime-INR     Status: None   Collection Time: 08/09/15  4:05 PM  Result Value Ref Range   Prothrombin Time 14.3 11.4 - 15.0 seconds   INR 1.09   APTT     Status: None   Collection Time: 08/09/15  4:05 PM  Result Value Ref Range   aPTT 29 24 - 36 seconds  TSH     Status: Abnormal   Collection Time: 08/09/15  4:05 PM  Result Value Ref Range   TSH 0.243 (L) 0.350 - 4.500 uIU/mL   Dg Chest Portable 1 View  08/09/2015  CLINICAL DATA:  Shortness of breath and chest tightness, dizziness, COPD, hypertension, coronary artery disease, former smoker EXAM: PORTABLE CHEST 1 VIEW COMPARISON:  Portable exam 1646 hours compared to 11/14/2014 and correlated with interval CT chest of 08/04/2015 FINDINGS: LEFT subclavian AICD leads project over RIGHT atrium, RIGHT ventricle and coronary sinus. Upper normal heart size post median sternotomy. Mediastinal contours and pulmonary vascularity normal. Tips of lung apices excluded. No acute infiltrate, pleural effusion, or pneumothorax. Bones demineralized. Prior LEFT clavicular ORIF. IMPRESSION: Post AICD and median sternotomy. No acute abnormalities. Electronically Signed   By: Lavonia Dana M.D.   On: 08/09/2015 17:04    Review of Systems  Constitutional: Negative for fever and chills.  HENT: Negative for sore throat and tinnitus.    Eyes: Negative for blurred vision and redness.  Respiratory: Negative for cough and shortness of breath.   Cardiovascular: Negative for chest pain, palpitations, orthopnea and PND.  Gastrointestinal: Negative for nausea, vomiting, abdominal pain and diarrhea.  Genitourinary:  Negative for dysuria, urgency and frequency.  Musculoskeletal: Negative for myalgias and joint pain.  Skin: Negative for rash.       No lesions  Neurological: Positive for dizziness. Negative for speech change, focal weakness and weakness.  Endo/Heme/Allergies: Does not bruise/bleed easily.       No temperature intolerance  Psychiatric/Behavioral: Negative for depression and suicidal ideas.    Blood pressure 121/60, pulse 60, temperature 97.6 F (36.4 C), temperature source Oral, resp. rate 18, height 6' (1.829 m), weight 89.359 kg (197 lb), SpO2 98 %. Physical Exam  Nursing note and vitals reviewed. Constitutional: He is oriented to person, place, and time. He appears well-developed and well-nourished. No distress.  HENT:  Head: Normocephalic and atraumatic.  Mouth/Throat: Oropharynx is clear and moist.  Eyes: Conjunctivae and EOM are normal. Pupils are equal, round, and reactive to light. No scleral icterus.  Neck: Normal range of motion. Neck supple. No JVD present. No tracheal deviation present. No thyromegaly present.  Cardiovascular: Normal rate, regular rhythm, normal heart sounds and intact distal pulses.  Exam reveals no gallop and no friction rub.   No murmur heard. Sinus rhythm with periods of wide complex tachycardia  GI: Soft. Bowel sounds are normal. He exhibits no distension. There is no tenderness.  Genitourinary:  Deferred  Musculoskeletal: Normal range of motion. He exhibits no edema.  Lymphadenopathy:    He has no cervical adenopathy.  Neurological: He is alert and oriented to person, place, and time. No cranial nerve deficit.  Skin: Skin is warm and dry. No rash noted. No erythema.  Wounds  on both feet  Psychiatric: He has a normal mood and affect. His behavior is normal. Judgment and thought content normal.     Assessment/Plan This is a 81 year old male admitted for pacemaker failure. 1. Pacemaker failure: There are no pacemaker spikes apparent on EKG. Normal sinus rhythm interspersed with periods of wide complex tachycardia sometimes consistent with nonsustained ventricular tachycardia. The patient is usually asymptomatic with these episodes. Continue to monitor telemetry. Cardiology consult. 2. Coronary artery disease: Stable; continue aspirin 3. Essential hypertension: Controlled; continue ramipril 4. Diabetes mellitus type 2: Does not appear the patient is on any outpatient medication. Check hemoglobin A1c. Also, the patient has some diabetic ulcers on both feet. He has walking boots but apparently has difficulty caring for his wounds at home. I have ordered a case management consult to help arrange better support following discharge. 5. DVT prophylaxis: Lovenox 6. GI prophylaxis: None The patient is a full code. Time spent on admission was inpatient care approximately 45  Harrie Foreman, MD 08/09/2015, 10:34 PM

## 2015-08-09 NOTE — ED Notes (Signed)
Pt ambulatory to triage.  Pt has sob and chest tightness.  Pt was here earlier this week with similar sx.  No n/v/d.   Pt also reports dizziness.  Pt alert.  Speech clear.

## 2015-08-09 NOTE — ED Provider Notes (Signed)
[facility]   ____________________________________________  Time seen: Approximately 4:24 PM  I have reviewed the triage vital signs and the nursing notes.   HISTORY  Chief Complaint Shortness of Breath    HPI [name] is a 81 y.o. male with coronary artery disease, sick sinus syndrome, hypertension hyperlipidemia, status post CABG and AICD pacemaker 2002 who presents for evaluation of recurrent positional lightheadedness and near syncope ongoing for the past few weeks, severe today, worse with standing, no other modifying factors. Patient reports that today he stood up his prior to arrival and nearly fainted, he had to grab the wall otherwise he would have fallen and hurt himself. He has also had shortness of breath which is ongoing. He was seen here on 08/04/2015 and had negative Doppler ultrasound of the legs, negative CTA chest/PE study. No chest pain. No vomiting, diarrhea, fevers or chills. He has not felt his AICD fire recently.    Past Medical History  Diagnosis Date  . COPD (chronic obstructive pulmonary disease) ([facility])   . Diabetes mellitus without complication ([facility])   . History of blood clots   . GERD (gastroesophageal reflux disease)   . Hyperlipidemia   . Hypertension   . CAD (coronary artery disease)   . Osteomyelitis ([facility])   . Arthritis   . PN (peripheral neuropathy) ([facility])   . 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 Literacy level of illiterate     Patient Active Problem List   Diagnosis Date Noted  . 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 Abnormal serum protein electrophoresis 12/05/2014  . Pressure ulcer 11/15/2014  . Syncope and collapse 11/14/2014  . Hyperproteinemia 11/02/2014  . Hypoalbuminemia 11/02/2014  . Arthritis 10/23/2014  . Arteriosclerosis of coronary artery 10/23/2014  . Diabetic foot ulcer associated with type 2 diabetes mellitus ([facility]) 10/23/2014   . Amputation of finger of left hand 10/23/2014  . History of pulmonary embolism 10/23/2014  . Literacy problem 10/23/2014  . Umbilical hernia without obstruction or gangrene 10/23/2014  . Vitamin B12 deficiency 10/23/2014  . Neuropathy of lower extremity 10/23/2014  . History of osteomyelitis 10/23/2014  . Complication of diabetes mellitus ([facility]) 03/27/2014  . Artificial cardiac pacemaker 08/10/2013  . H/O coronary artery bypass surgery 08/10/2013  . Benign prostatic hyperplasia with urinary obstruction 09/27/2012  . Abnormal prostate specific antigen 09/27/2012    Past Surgical History  Procedure Laterality Date  . Pacemaker insertion    . Appendectomy    . Coronary artery bypass graft  2002  . Clavicle surgery  2012    open reduction and internal fixation of left clavicle  . Amputation 4th finger Left   . Foot surgery      Current Outpatient Rx  Name  Route  Sig  Dispense  Refill  . atorvastatin (LIPITOR) 40 MG tablet   Oral   Take 1 tablet (40 mg total) by mouth at bedtime.   30 tablet   1   . furosemide (LASIX) 20 MG tablet   Oral   Take 1 tablet (20 mg total) by mouth daily.   30 tablet   1   . gabapentin (NEURONTIN) 300 MG capsule   Oral   Take 1 capsule (300 mg total) by mouth 3 (three) times daily.   90 capsule   1   . Omega-3 Fatty Acids (FISH OIL) 600 MG CAPS   Oral   Take 300 mg by mouth 2 (two) times daily.         [name]  omeprazole (PRILOSEC) 40 MG capsule   Oral   Take 1 capsule (40 mg total) by mouth daily.   30 capsule   1   . OVER THE COUNTER MEDICATION   Oral   Take 2 capsules by mouth daily. Pt takes OmegaXL.         . ramipril (ALTACE) 5 MG capsule   Oral   Take 1 capsule (5 mg total) by mouth daily.   30 capsule   1   . vitamin B-12 (CYANOCOBALAMIN) 500 MCG tablet   Oral   Take 500 mcg by mouth daily.           Allergies Oxycodone-acetaminophen and Percocet  Family History  Problem Relation Age of Onset  . Cancer  Mother     spine  . Hypertension Mother   . Mental illness Mother   . Cancer Father     lung  . Asthma Father   . Allergic rhinitis Father   . Arthritis Father     Social History Social History  Substance Use Topics  . Smoking status: Former Smoker -- 0.25 packs/day    Types: Cigarettes, Cigars    Quit date: 10/18/1979  . Smokeless tobacco: Never Used  . Alcohol Use: No    Review of Systems Constitutional: No fever/chills Eyes: No visual changes. ENT: No sore throat. Cardiovascular: Denies chest pain. Respiratory: +shortness of breath. Gastrointestinal: No abdominal pain.  No nausea, no vomiting.  No diarrhea.  No constipation. Genitourinary: Negative for dysuria. Musculoskeletal: Negative for back pain. Skin: Negative for rash. Neurological: Negative for headaches, focal weakness or numbness.  10-point ROS otherwise negative.  ____________________________________________   PHYSICAL EXAM:  VITAL SIGNS: ED Triage Vitals  Enc Vitals Group     BP 08/09/15 1600 123/56 mmHg     Pulse Rate 08/09/15 1600 73     Resp 08/09/15 1600 20     Temp 08/09/15 1600 97.7 F (36.5 C)     Temp Source 08/09/15 1600 Oral     SpO2 08/09/15 1600 97 %     Weight 08/09/15 1600 197 lb (89.359 kg)     Height 08/09/15 1600 6' (1.829 m)     Head Cir --      Peak Flow --      Pain Score --      Pain Loc --      Pain Edu? --      Excl. in GC? --     Constitutional: Alert and oriented. Well appearing and in no acute distress. Eyes: Conjunctivae are normal. PERRL. EOMI. Head: Atraumatic. Nose: No congestion/rhinnorhea. Mouth/Throat: Mucous membranes are moist.  Oropharynx non-erythematous. Neck: No stridor.  Cardiovascular: Normal rate, regular rhythm. Grossly normal heart sounds.  Good peripheral circulation. Respiratory: Normal respiratory effort.  No retractions. Lungs CTAB. Gastrointestinal: Soft and nontender. No distention.  No CVA tenderness. Genitourinary:  deferred Musculoskeletal: No lower extremity tenderness nor edema.  No joint effusions. Neurologic:  Normal speech and language. No gross focal neurologic deficits are appreciated. No gait instability. 5 out of 5 strength in bilateral upper and lower extremity, sensation intact to light touch throughout, cranial nerves II through XII intact, normal finger nose finger. Skin:  Skin is warm, dry and intact. No rash noted. Psychiatric: Mood and affect are normal. Speech and behavior are normal.  ____________________________________________   LABS (all labs ordered are listed, but only abnormal results are displayed)  Labs Reviewed  CBC - Abnormal; Notable for the following:    RBC  4.37 (*)    Hemoglobin 12.9 (*)    HCT 37.5 (*)    RDW 16.9 (*)    All other components within normal limits  COMPREHENSIVE METABOLIC PANEL - Abnormal; Notable for the following:    Potassium 3.1 (*)    CO2 19 (*)    Glucose, Bld 201 (*)    Calcium 8.8 (*)    Total Protein 8.6 (*)    ALT 13 (*)    GFR calc non Af Amer 54 (*)    All other components within normal limits  TROPONIN I  PROTIME-INR  APTT  URINALYSIS COMPLETEWITH MICROSCOPIC ([facility] ONLY)   ____________________________________________  EKG  ED ECG REPORT I, Gayla DossGayle, Hiedi Touchton A, the attending physician, personally viewed and interpreted this ECG.   Date: 08/09/2015  EKG Time: 16:06  Rate: 82  Rhythm: Wide QRS rhythm.  Axis: normal  Intervals:nonspecific intraventricular conduction delay  ST&T Change: No acute ST elevation MI.  ED ECG REPORT I, Gayla DossGayle, Glennon Kopko A, the attending physician, personally viewed and interpreted this ECG.   Date: 08/09/2015  EKG Time: 16:26  Rate: 71  Rhythm: Atrial sensed ventricular paced rhythm    ____________________________________________  RADIOLOGY  CXR IMPRESSION: Post AICD and median sternotomy.  No acute  abnormalities.  ____________________________________________   PROCEDURES  Procedure(s) performed: None  Critical Care performed: No  ____________________________________________   INITIAL IMPRESSION / ASSESSMENT AND PLAN / ED COURSE  Pertinent labs & imaging results that were available during my care of the patient were reviewed by me and considered in my medical decision making (see chart for details).  Delane Gingerrthur E Coviello is a 81 y.o. male with coronary artery disease, sick sinus syndrome, hypertension hyperlipidemia, status post CABG and AICD pacemaker 2002 who presents for evaluation of positional lightheadedness and near syncope ongoing for the past few weeks, worse today. On exam, he is nontoxic appearing in no acute distress, on arrival to ER bedside, his vital signs are stable and he is afebrile however he had a triage EKG which showed a wide regular non-paced rhythm. By the time he was brought back to room 6, he was again in a paced rhythm. He does have orthostatic hypotension and I suspect some component of this is related to orthostasis however I discussed the case with Dr. Ann MakiParrish shows was seen the patient in clinic previously, he reviewed the EKG and agrees that there is concern that this could represent early/intermittent pacemaker failure. He recommends admission and pacemaker interrogation. I reviewed the patient's labs, CBC generally unremarkable as is CMP, negative troponin. Chest x-ray shows no acute abnormalities. I discussed the case with the hospitalist, Dr. Sheryle Haildiamond for admission at 5:45 PM. ____________________________________________   FINAL CLINICAL IMPRESSION(S) / ED DIAGNOSES  Final diagnoses:  Lightheadedness  Near syncope  Pacemaker failure, initial encounter  Orthostatic hypotension      NEW MEDICATIONS STARTED DURING THIS VISIT:  New Prescriptions   No medications on file     Note:  This document was prepared using Dragon voice recognition  software and may include unintentional dictation errors.    Gayla DossEryka A Doris Mcgilvery, MD 08/09/15 1806

## 2015-08-10 ENCOUNTER — Inpatient Hospital Stay: Payer: Medicare PPO

## 2015-08-10 LAB — HEMOGLOBIN A1C: Hgb A1c MFr Bld: 5.7 % (ref 4.0–6.0)

## 2015-08-10 MED ORDER — SODIUM CHLORIDE 0.9 % IV BOLUS (SEPSIS): 500.0000 mL | Freq: Once | INTRAVENOUS | Status: DC

## 2015-08-10 MED ORDER — MAGNESIUM SULFATE 2 GM/50ML IV SOLN
2.0000 g | Freq: Once | INTRAVENOUS | Status: AC
  Administered 2015-08-10: 2 g via INTRAVENOUS
  Filled 2015-08-10: qty 50

## 2015-08-10 MED ORDER — POTASSIUM CHLORIDE CRYS ER 20 MEQ PO TBCR
40.0000 meq | EXTENDED_RELEASE_TABLET | Freq: Once | ORAL | Status: AC
  Administered 2015-08-10: 40 meq via ORAL
  Filled 2015-08-10: qty 2

## 2015-08-10 NOTE — Progress Notes (Addendum)
Patient is very frustrated with the doctors about the conversation he had with them during the day. He's complaining about the food and everything that is being offered to him here. Patient is using profane and cursing words when the RN got to his room to assess him. The patient stated, "I don't have anything to live for anymore and I'm going to buy a bunch of  drugs to take and kill myself or I'll run my truck at a high speed to go the woods to kill myself." The RN asked the patient when  he plans  to do that? The patient stated to the RN, I don't know when but  I  promise you  Is not going to be long." Dr. Sheryle Hailiamond was notified of patient's behavior and he indicated he was going to order for a bedside sitter. Will continue to monitor.

## 2015-08-10 NOTE — Progress Notes (Signed)
Patient ID: [name], male   DOB: [birth year], 81 y.o.   MRN: [id number] [facility] PROGRESS NOTE  [name] [id number] DOB: [birth year] DOA: 08/09/2015 PCP: Baruch GoutyMelinda [name], MD  HPI/Subjective: Patient was not feeling well when coming in. He had dizziness and shortness of breath with ambulation.  Objective: Filed Vitals:   08/10/15 0800 08/10/15 1229  BP: 140/74 117/50  Pulse: 60 59  Temp: 98.1 F (36.7 C) 98.3 F (36.8 C)  Resp: 16 16    Filed Weights   08/09/15 1600 08/10/15 0420  Weight: 89.359 kg (197 lb) 89.132 kg (196 lb 8 oz)    ROS: Review of Systems  Constitutional: Negative for fever and chills.  Eyes: Negative for blurred vision.  Respiratory: Positive for shortness of breath. Negative for cough.   Cardiovascular: Negative for chest pain.  Gastrointestinal: Negative for nausea, vomiting, abdominal pain, diarrhea and constipation.  Genitourinary: Negative for dysuria.  Musculoskeletal: Negative for joint pain.  Neurological: Positive for dizziness. Negative for headaches.   Exam: Physical Exam  Constitutional: He is oriented to person, place, and time.  HENT:  Nose: No mucosal edema.  Mouth/Throat: No oropharyngeal exudate or posterior oropharyngeal edema.  Eyes: Conjunctivae, EOM and lids are normal. Pupils are equal, round, and reactive to light.  Neck: No JVD present. Carotid bruit is not present. No edema present. No thyroid mass and no thyromegaly present.  Cardiovascular: S1 normal and S2 normal.  Exam reveals no gallop.   No murmur heard. Pulses:      Dorsalis pedis pulses are 2+ on the right side, and 2+ on the left side.  Respiratory: No respiratory distress. He has no wheezes. He has no rhonchi. He has no rales.  GI: Soft. Bowel sounds are normal. There is no tenderness.  Musculoskeletal:       Right ankle: He exhibits swelling.       Left ankle: He exhibits swelling.  Lymphadenopathy:    He has no cervical adenopathy.   Neurological: He is alert and oriented to person, place, and time. No cranial nerve deficit.  Skin: Skin is warm. No rash noted. Nails show no clubbing.  Psychiatric: He has a normal mood and affect.      Data Reviewed: Basic Metabolic Panel:  Recent Labs Lab 08/04/15 1806 08/09/15 1605  NA 137 138  K 3.5 3.1*  CL 105 109  CO2 23 19*  GLUCOSE 108* 201*  BUN 15 10  CREATININE 1.30* 1.24  CALCIUM 10.0 8.8*   Liver Function Tests:  Recent Labs Lab 08/04/15 1806 08/09/15 1605  AST 25 29  ALT 15* 13*  ALKPHOS 104 102  BILITOT 0.9 0.8  PROT 9.4* 8.6*  ALBUMIN 4.2 3.7   CBC:  Recent Labs Lab 08/04/15 1806 08/09/15 1605  WBC 7.8 7.0  HGB 14.3 12.9*  HCT 42.2 37.5*  MCV 87.5 85.9  PLT 248 215     Studies: Dg Chest Portable 1 View  08/09/2015  CLINICAL DATA:  Shortness of breath and chest tightness, dizziness, COPD, hypertension, coronary artery disease, former smoker EXAM: PORTABLE CHEST 1 VIEW COMPARISON:  Portable exam 1646 hours compared to 11/14/2014 and correlated with interval CT chest of 08/04/2015 FINDINGS: LEFT subclavian AICD leads project over RIGHT atrium, RIGHT ventricle and coronary sinus. Upper normal heart size post median sternotomy. Mediastinal contours and pulmonary vascularity normal. Tips of lung apices excluded. No acute infiltrate, pleural effusion, or pneumothorax. Bones demineralized. Prior LEFT clavicular ORIF. IMPRESSION: Post AICD and median sternotomy.  No acute abnormalities. Electronically Signed   By: Ulyses SouthwardMark  Boles M.D.   On: 08/09/2015 17:04    Scheduled Meds: . aspirin EC  81 mg Oral Daily  . atorvastatin  40 mg Oral QHS  . docusate sodium  100 mg Oral BID  . enoxaparin (LOVENOX) injection  40 mg Subcutaneous Q24H  . gabapentin  300 mg Oral TID  . pantoprazole  40 mg Oral Daily  . ramipril  5 mg Oral Daily  . sodium chloride  500 mL Intravenous Once  . sodium chloride flush  3 mL Intravenous Q12H  . vitamin B-12  500 mcg Oral  Daily    Assessment/Plan:  1. Near syncope, orthostatic hypotension. Hold Lasix and give a fluid bolus. Continue to check orthostatic vitals. May need to hold Altace also but we will see. I will get physical therapy evaluation. CAT scan of the head. 2. Patient was admitted with possible pacemaker failure. Case discussed with Dr. Evette GeorgesParashos cardiology and he said it is working but it skin and need to be changed out within the next 90 days and he was thinking about doing it while here in the hospital. But he needs another adapter and that will be available tomorrow so it may have to be done as outpatient. 3. Hyperlipidemia unspecified on atorvastatin 4. Chronic kidney disease stage III 5. Gastroesophageal reflux disease on Protonix  Code Status:     Code Status Orders        Start     Ordered   08/09/15 1837  Full code   Continuous     08/09/15 1836    Code Status History    Date Active Date Inactive Code Status Order ID Comments User Context   11/14/2014  3:58 PM 11/15/2014  9:35 PM Full Code [id number]  Auburn BilberryShreyang Patel, MD Inpatient      Disposition Plan: Potentially home tomorrow  Consultants:  Cardiology  Time spent: 25 minutes  Alford HighlandWIETING, Raina Sole  [employer] [facility]

## 2015-08-10 NOTE — Clinical Social Work Note (Signed)
Clinical Social Work Assessment  Patient Details  Name: [name] MRN: [id number] Date of Birth: [birth year]  Date of referral:  08/10/15               Reason for consult:  Facility Placement                Permission sought to share information with:  Family Supports, Chartered certified accountant granted to share information::  No  Name::     NO verbal permission granted to speak to his family but could contact facilities  Agency::  SNF, shelters  Relationship::     Contact Information:     Housing/Transportation Living arrangements for the past 2 months:  Homeless Source of Information:  Patient Patient Interpreter Needed:  None Criminal Activity/Legal Involvement Pertinent to Current Situation/Hospitalization:  No - Comment as needed Significant Relationships:  Adult Children Lives with:  Self Do you feel safe going back to the place where you live?  No Need for family participation in patient care:  No (Coment)  Care giving concerns: No contact with his family ( No verbal consent given to speak to his family)   Facilities manager / plan: LCSW met with patient who was agitated and complaining of the food. Patient is independent with his adl's , he showers,dresses himself, walks with pain ( sore feet) he has shortness of breath and is limited now because of distance. He is continent and goes to the bathroom on his own. He has coverage of [facility] Medicare and reports he has been in home before and they treated him very bad. 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 He reports he stays in his truck when he has to. He received 1000.00 a month with SSDI and wont have any money left to live off of. LCSW explained SNF and reported he would have a reduced cheque, Patient has given verbal consent to find a place, NO CONTACT with family. By end of interview patient appeared to be talking freely and not  complaining as much. LCSW advocated to ensure he was eating enough and he is not having surgery as his adapter Educational psychologist) not available, he can follow up with out patient provider.  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[employer], materials) PT Recommendations:  Not assessed at this time Information / Referral to community resources:  [place]  Patient/Family's Response to care: No comment  Patient/Family's Understanding of and Emotional Response to Diagnosis, Current Treatment, and Prognosis:    Emotional Assessment Appearance:  Appears stated age Attitude/Demeanor/Rapport:  Complaining, Suspicious, Guarded Affect (typically observed):  Appropriate, Apprehensive Orientation:  Oriented to Self, Oriented to Place, Oriented to  Time, Oriented to Situation Alcohol / Substance use:  Tobacco Use Psych involvement (Current and /or in the community):  No (Comment)  Discharge Needs  Concerns to be addressed:  Homelessness Readmission within the last 30 days:  No Current discharge risk:  Chronically ill, Lack of support system Barriers to Discharge:  Continued Medical Work up   Joana Reamer, LCSW 08/10/2015, 4:29 PM

## 2015-08-10 NOTE — Progress Notes (Signed)
Inpatient Diabetes Program Recommendations  AACE/ADA: New Consensus Statement on Inpatient Glycemic Control (2015)  Target Ranges:  Prepandial:   less than 140 mg/dL      Peak postprandial:   less than 180 mg/dL (1-2 hours)      Critically ill patients:  140 - 180 mg/dL   Results for Delane GingerFORTUNE, Edouard E (MRN [id number]) as of 08/10/2015 13:02  Ref. Range 07/18/2015 16:55  Hemoglobin A1C Latest Ref Range: 4.8-5.6 % 5.6    Admit with: Pacemaker Failure  History: DM, CABG, COPD  Home DM Meds: None  Current Insulin Orders: None    Current A1c= 5.6% drawn two weeks ago on 07/18/15.  Good glucose contorl at home despite the fact that patient not taking any DM medications at home.  MD- Please consider placing orders for Novolog Sensitive Correction Scale/ SSI (0-9 units) TID AC + HS     --Will follow patient during hospitalization--  Ambrose FinlandJeannine Johnston Bray Vickerman RN, MSN, CDE Diabetes Coordinator Inpatient Glycemic Control Team Team Pager: [phone] (8a-5p)

## 2015-08-10 NOTE — Consult Note (Signed)
[facility] Cardiology  CARDIOLOGY CONSULT NOTE  Patient ID: [name] MRN: [id number] DOB/AGE: [birth year] 81 y.o.  Admit date: 08/09/2015 Referring Physician [place] Primary Physician [facility] Primary Cardiologist Jaylyn Booher Reason for Consultation Bradycardia and near syncope  HPI: 81 year old gentleman referred for evaluation of bradycardia. The patient has known coronary artery disease, status post CABG. She has a history ischemic cardio myopathy status post BiV ICD. Last interrogation was performed 04/26/2013 which revealed atrial sensing with bi-V pacing. The patient reports a one year history of episodes of physician or lightheadedness, near syncope, with occasional nausea and abdominal discomfort. He presented to [facility] ED yesterday with worsening symptoms. Several ECGs were performed which revealed partial bradycardia, marked sinus bradycardia, with atrial sensing and ventricular pacing. The patient has ruled out for myocardial infarction by troponin. The patient was hypokalemic with a potassium of 3.1.  Review of systems complete and found to be negative unless listed above     Past Medical History  Diagnosis Date  . COPD (chronic obstructive pulmonary disease) (HCC)   . Diabetes mellitus without complication (HCC)   . History of blood clots   . GERD (gastroesophageal reflux disease)   . Hyperlipidemia   . Hypertension   . CAD (coronary artery disease)   . Osteomyelitis (HCC)   . Arthritis   . PN (peripheral neuropathy) (HCC)   . 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 Literacy level of illiterate     Past Surgical History  Procedure Laterality Date  . Pacemaker insertion    . Appendectomy    . Coronary artery bypass graft  2002  . Clavicle surgery  2012    open reduction and internal fixation of left clavicle  . Amputation 4th finger Left   . Foot surgery      Prescriptions prior to admission  Medication Sig Dispense Refill Last Dose  . atorvastatin (LIPITOR) 40 MG  tablet Take 1 tablet (40 mg total) by mouth at bedtime. 30 tablet 1   . furosemide (LASIX) 20 MG tablet Take 1 tablet (20 mg total) by mouth daily. 30 tablet 1   . gabapentin (NEURONTIN) 300 MG capsule Take 1 capsule (300 mg total) by mouth 3 (three) times daily. 90 capsule 1   . Omega-3 Fatty Acids (FISH OIL) 600 MG CAPS Take 300 mg by mouth 2 (two) times daily.     [name] omeprazole (PRILOSEC) 40 MG capsule Take 1 capsule (40 mg total) by mouth daily. 30 capsule 1   . OVER THE COUNTER MEDICATION Take 2 capsules by mouth daily. Pt takes OmegaXL.   11/13/2014 at Unknown time  . ramipril (ALTACE) 5 MG capsule Take 1 capsule (5 mg total) by mouth daily. 30 capsule 1   . vitamin B-12 (CYANOCOBALAMIN) 500 MCG tablet Take 500 mcg by mouth daily.      Social History   Social History  . Marital Status: Divorced    Spouse Name: N/A  . Number of Children: N/A  . Years of Education: N/A   Occupational History  . Not on file.   Social History Main Topics  . Smoking status: Former Smoker -- 0.25 packs/day    Types: Cigarettes, Cigars    Quit date: 10/18/1979  . Smokeless tobacco: Never Used  . Alcohol Use: No  . Drug Use: No  . Sexual Activity: Not on file   Other Topics Concern  . Not on file   Social History Narrative    Family History  Problem Relation Age of Onset  . Cancer Mother  spine  . Hypertension Mother   . Mental illness Mother   . Cancer Father     lung  . Asthma Father   . Allergic rhinitis Father   . Arthritis Father       Review of systems complete and found to be negative unless listed above      PHYSICAL EXAM  General: Well developed, well nourished, in no acute distress HEENT:  Normocephalic and atramatic Neck:  No JVD.  Lungs: Clear bilaterally to auscultation and percussion. Heart: HRRR . Normal S1 and S2 without gallops or murmurs.  Abdomen: Bowel sounds are positive, abdomen soft and non-tender  Msk:  Back normal, normal gait. Normal strength and  tone for age. Extremities: No clubbing, cyanosis or edema.   Neuro: Alert and oriented X 3. Psych:  Good affect, responds appropriately  Labs:   Lab Results  Component Value Date   WBC 7.0 08/09/2015   HGB 12.9* 08/09/2015   HCT 37.5* 08/09/2015   MCV 85.9 08/09/2015   PLT 215 08/09/2015    Recent Labs Lab 08/09/15 1605  NA 138  K 3.1*  CL 109  CO2 19*  BUN 10  CREATININE 1.24  CALCIUM 8.8*  PROT 8.6*  BILITOT 0.8  ALKPHOS 102  ALT 13*  AST 29  GLUCOSE 201*   Lab Results  Component Value Date   TROPONINI <0.03 08/09/2015    Lab Results  Component Value Date   CHOL 171 07/18/2015   CHOL 152 11/14/2014   Lab Results  Component Value Date   HDL 43 07/18/2015   HDL 36* 11/14/2014   Lab Results  Component Value Date   LDLCALC 99 07/18/2015   LDLCALC 95 11/14/2014   Lab Results  Component Value Date   TRIG 144 07/18/2015   TRIG 106 11/14/2014   No results found for: CHOLHDL No results found for: LDLDIRECT    Radiology: Ct Angio Chest Pe W/cm &/or Wo Cm  08/04/2015  CLINICAL DATA:  SOB intermittently x 2 months, pt reports his MD in [place] dx him with a blood clot in his right leg but he is not on blood thinners, EXAM: CT ANGIOGRAPHY CHEST WITH CONTRAST TECHNIQUE: Multidetector CT imaging of the chest was performed using the standard protocol during bolus administration of intravenous contrast. Multiplanar CT image reconstructions and MIPs were obtained to evaluate the vascular anatomy. CONTRAST:  75 ml isovue 370 given COMPARISON:  08/15/2012 FINDINGS: Vascular: Right arm contrast administration. Left subclavian AICD with leads extending into the coronary sinus and towards the right ventricular apex. The SVC is patent. Right atrium and right ventricle are nondilated. Satisfactory opacification of pulmonary arteries noted, and there is no evidence of pulmonary emboli. Patent pulmonary veins drain into the left atrium, with a common trunk on the left. Moderate coronary  calcifications. Previous median sternotomy and CABG. Incomplete contrast opacification of the thoracic aorta. Mild dilatation of the ascending aorta 4 cm diameter in its proximal segment, 4.4 cm in its midportion. The proximal arch measures 3.9 cm diameter, distal arch/ proximal descending 3.6 cm. There is partially calcified plaque in the arch and descending segment. Lower attenuation probable mural thrombus in the distal arch and descending segment. Mediastinum/Lymph Nodes: Enlarged right hilar nodes up to 11 mm short axis diameter. No mediastinal adenopathy. Lungs/Pleura: Linear scarring or subsegmental atelectasis in the lingula and right middle lobe. No confluent airspace disease. No pleural effusion. No pneumothorax. Upper abdomen: No acute findings. Lobular spleen. Probable cyst in hepatic segment 2,  incompletely characterized, slightly enlarged since previous exam. Musculoskeletal: Anterior vertebral endplate spurring at multiple levels in the mid and lower thoracic spine. Previous median sternotomy. Review of the MIP images confirms the above findings. IMPRESSION: 1. Negative for acute PE. 2. 4.4 cm ascending aortic aneurysm (previously 4.1 cm). Recommend annual imaging followup by CTA or MRA. This recommendation follows 2010 ACCF/AHA/AATS/ACR/ASA/SCA/SCAI/SIR/STS/SVM Guidelines for the Diagnosis and Management of Patients with Thoracic Aortic Disease. Circulation. 2010; 121: Z610-R604: [id number] Electronically Signed   By: Corlis Leak  Hassell M.D.   On: 08/04/2015 19:17   [place] Venous Img Lower Bilateral  08/04/2015  CLINICAL DATA:  Acute onset of bilateral leg pain and swelling. Initial encounter. EXAM: BILATERAL LOWER EXTREMITY VENOUS DOPPLER ULTRASOUND TECHNIQUE: Gray-scale sonography with graded compression, as well as color Doppler and duplex ultrasound were performed to evaluate the lower extremity deep venous systems from the level of the common femoral vein and including the common femoral, femoral, profunda femoral,  popliteal and calf veins including the posterior tibial, peroneal and gastrocnemius veins when visible. The superficial great saphenous vein was also interrogated. Spectral Doppler was utilized to evaluate flow at rest and with distal augmentation maneuvers in the common femoral, femoral and popliteal veins. COMPARISON:  Bilateral lower extremity venous Doppler ultrasound performed 09/08/2013 FINDINGS: RIGHT LOWER EXTREMITY Common Femoral Vein: No evidence of thrombus. Normal compressibility, respiratory phasicity and response to augmentation. Saphenofemoral Junction: No evidence of thrombus. Normal compressibility and flow on color Doppler imaging. Profunda Femoral Vein: No evidence of thrombus. Normal compressibility and flow on color Doppler imaging. Femoral Vein: No evidence of thrombus. Normal compressibility, respiratory phasicity and response to augmentation. Popliteal Vein: No evidence of thrombus. Normal compressibility, respiratory phasicity and response to augmentation. Calf Veins: No evidence of thrombus. Normal compressibility and flow on color Doppler imaging. The peroneal vein is not visualized. Superficial Great Saphenous Vein: No evidence of thrombus. Normal compressibility and flow on color Doppler imaging. Venous Reflux:  None. Other Findings:  None. LEFT LOWER EXTREMITY Common Femoral Vein: No evidence of thrombus. Normal compressibility, respiratory phasicity and response to augmentation. Saphenofemoral Junction: No evidence of thrombus. Normal compressibility and flow on color Doppler imaging. Profunda Femoral Vein: No evidence of thrombus. Normal compressibility and flow on color Doppler imaging. Femoral Vein: No evidence of thrombus. Normal compressibility, respiratory phasicity and response to augmentation. Popliteal Vein: No evidence of thrombus. Normal compressibility, respiratory phasicity and response to augmentation. Calf Veins: No evidence of thrombus. Normal compressibility and flow  on color Doppler imaging. Superficial Great Saphenous Vein: No evidence of thrombus. Normal compressibility and flow on color Doppler imaging. Venous Reflux:  None. Other Findings:  None. IMPRESSION: No evidence of deep venous thrombosis. Electronically Signed   By: Roanna RaiderJeffery  Chang M.D.   On: 08/04/2015 22:40   Dg Chest Portable 1 View  08/09/2015  CLINICAL DATA:  Shortness of breath and chest tightness, dizziness, COPD, hypertension, coronary artery disease, former smoker EXAM: PORTABLE CHEST 1 VIEW COMPARISON:  Portable exam 1646 hours compared to 11/14/2014 and correlated with interval CT chest of 08/04/2015 FINDINGS: LEFT subclavian AICD leads project over RIGHT atrium, RIGHT ventricle and coronary sinus. Upper normal heart size post median sternotomy. Mediastinal contours and pulmonary vascularity normal. Tips of lung apices excluded. No acute infiltrate, pleural effusion, or pneumothorax. Bones demineralized. Prior LEFT clavicular ORIF. IMPRESSION: Post AICD and median sternotomy. No acute abnormalities. Electronically Signed   By: Ulyses SouthwardMark  Boles M.D.   On: 08/09/2015 17:04    EKG: Intermittent atrial sensing with ventricular pacing  ASSESSMENT AND PLAN:   1. Status post BiV ICD, with intermittent bradycardia, without recent interrogation, suggests end-of-life of ICD generator 2. CAD, status post CABG, with ischemic cardiomyopathy, negative troponin  Recommendations  1. Agree with current therapy 2. ICD interrogation 3. Probable ICD generator change out pending interrogation  Signed: Shakyia Bosso MD,PhD, [facility] CenterFACC 08/10/2015, 9:37 AM

## 2015-08-10 NOTE — [place] FL2 (Signed)
[facility] MEDICAID FL2 LEVEL OF CARE SCREENING TOOL     IDENTIFICATION  Patient Name: [name] Birthdate: 09-19-1934 Sex: male Admission Date (Current Location): 08/09/2015  [place]   Facility and Address:  [facility], [address], [place], [place] [address]      Provider Number: [id number]  Attending Physician Name and Address:  Alford Highlandichard Wieting, MD  Relative Name and Phone Number:       Current Level of Care: Hospital Recommended Level of Care: Skilled Nursing Facility Prior Approval Number:    Date Approved/Denied:   PASRR Number:   [id number] Discharge Plan: SNF    Current Diagnoses: Patient Active Problem List   Diagnosis Date Noted  . 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 Abnormal serum protein electrophoresis 12/05/2014  . Pressure ulcer 11/15/2014  . Syncope and collapse 11/14/2014  . Hyperproteinemia 11/02/2014  . Hypoalbuminemia 11/02/2014  . Arthritis 10/23/2014  . Arteriosclerosis of coronary artery 10/23/2014  . Diabetic foot ulcer associated with type 2 diabetes mellitus (HCC) 10/23/2014  . Amputation of finger of left hand 10/23/2014  . History of pulmonary embolism 10/23/2014  . Literacy problem 10/23/2014  . Umbilical hernia without obstruction or gangrene 10/23/2014  . Vitamin B12 deficiency 10/23/2014  . Neuropathy of lower extremity 10/23/2014  . History of osteomyelitis 10/23/2014  . Complication of diabetes mellitus (HCC) 03/27/2014  . Artificial cardiac pacemaker 08/10/2013  . H/O coronary artery bypass surgery 08/10/2013  . Benign prostatic hyperplasia with urinary obstruction 09/27/2012  . Abnormal prostate specific antigen 09/27/2012    Orientation RESPIRATION BLADDER Height & Weight     Self  Normal Continent Weight: 196 lb 8 oz (89.132 kg) Height:  6' (182.9 cm)  BEHAVIORAL SYMPTOMS/MOOD NEUROLOGICAL BOWEL NUTRITION STATUS   Continent  (Diabetic, heart smart)  AMBULATORY STATUS COMMUNICATION OF NEEDS Skin   Independent Verbally Normal                       Personal Care Assistance Level of Assistance  Bathing, Feeding, Dressing, Total care Bathing Assistance: Independent Feeding assistance: Independent Dressing Assistance: Independent Total Care Assistance: Independent   Functional Limitations Info  Sight, Hearing, Speech Sight Info: Adequate Hearing Info: Adequate Speech Info: Adequate    SPECIAL CARE FACTORS FREQUENCY  PT (By licensed PT), OT (By licensed OT)     PT Frequency: 5x              Contractures      Additional Factors Info  Code Status, Allergies   Allergies Info: Oxycodone-acetaminophen,Percocet           Current Medications (08/10/2015):  This is the current hospital active medication list Current Facility-Administered Medications  Medication Dose Route Frequency Provider Last Rate Last Dose  . acetaminophen (TYLENOL) tablet 650 mg  650 mg Oral Q6H PRN Arnaldo NatalMichael S Diamond, MD       Or  . acetaminophen (TYLENOL) suppository 650 mg  650 mg Rectal Q6H PRN Arnaldo NatalMichael S Diamond, MD      . aspirin EC tablet 81 mg  81 mg Oral Daily Arnaldo NatalMichael S Diamond, MD   81 mg at 08/10/15 0957  . atorvastatin (LIPITOR) tablet 40 mg  40 mg Oral QHS Arnaldo NatalMichael S Diamond, MD   40 mg at 08/09/15 2206  . docusate sodium (COLACE) capsule 100 mg  100 mg Oral BID Arnaldo NatalMichael S Diamond, MD   100 mg at 08/10/15 0956  . enoxaparin (  LOVENOX) injection 40 mg  40 mg Subcutaneous Q24H Arnaldo NatalMichael S Diamond, MD   40 mg at 08/09/15 2207  . gabapentin (NEURONTIN) capsule 300 mg  300 mg Oral TID Arnaldo NatalMichael S Diamond, MD   300 mg at 08/10/15 1645  . magnesium sulfate IVPB 2 g 50 mL  2 g Intravenous Once Alford Highlandichard Wieting, MD   2 g at 08/10/15 1645  . morphine 2 MG/ML injection 1 mg  1 mg Intravenous Q3H PRN Arnaldo NatalMichael S Diamond, MD      . ondansetron [facility](ZOFRAN) tablet 4 mg  4 mg Oral Q6H PRN Arnaldo NatalMichael S Diamond, MD       Or  .  ondansetron [facility](ZOFRAN) injection 4 mg  4 mg Intravenous Q6H PRN Arnaldo NatalMichael S Diamond, MD      . pantoprazole (PROTONIX) EC tablet 40 mg  40 mg Oral Daily Arnaldo NatalMichael S Diamond, MD   40 mg at 08/10/15 0956  . potassium chloride SA (K-DUR,KLOR-CON) CR tablet 40 mEq  40 mEq Oral Once Alford Highlandichard Wieting, MD      . ramipril (ALTACE) capsule 5 mg  5 mg Oral Daily Arnaldo NatalMichael S Diamond, MD   5 mg at 08/10/15 0957  . sodium chloride 0.9 % bolus 500 mL  500 mL Intravenous Once Alford Highlandichard Wieting, MD      . sodium chloride flush (NS) 0.9 % injection 3 mL  3 mL Intravenous Q12H Arnaldo NatalMichael S Diamond, MD   3 mL at 08/09/15 2207  . vitamin B-12 (CYANOCOBALAMIN) tablet 500 mcg  500 mcg Oral Daily Arnaldo NatalMichael S Diamond, MD   500 mcg at 08/10/15 [id number]     Discharge Medications: Please see discharge summary for a list of discharge medications.  Relevant Imaging Results:  Relevant Lab Results:   Additional Information SSN [id number]  [name], Tobey Lippard M, [place]

## 2015-08-10 NOTE — Progress Notes (Signed)
LCSW met with patient who reported he is not able to return to his daughters home and he needs a SNF/place to live. LCSW agreed to meet with him at 4pm. Will complete assessment and explain process and send out requests for bed offers.   [employer] LCSW [phone]

## 2015-08-10 NOTE — Progress Notes (Signed)
LCSW met with patient and he reported that he is unable to live with his daughters and did not give this worker permission to call them. He reports he has been living in and out of truck since 2012 and back and forth to [place]. Patient refused to answer some question and reports HE DOES NOT READ. He preferred to tell his story of [place] and the oppression he continues to feel. LCSW encouraged to eat the food provided and listened to his story. He admits he needs help to find a place to stay. LCSW will come back tommorow. Patient having a CT scan.  [employer] LCSW [phone]

## 2015-08-11 DIAGNOSIS — S069X9S Unspecified intracranial injury with loss of consciousness of unspecified duration, sequela: Secondary | ICD-10-CM

## 2015-08-11 DIAGNOSIS — F02818 Dementia in other diseases classified elsewhere, unspecified severity, with other behavioral disturbance: Secondary | ICD-10-CM

## 2015-08-11 DIAGNOSIS — F0281 Dementia in other diseases classified elsewhere with behavioral disturbance: Secondary | ICD-10-CM

## 2015-08-11 LAB — MAGNESIUM: Magnesium: 2.2 mg/dL (ref 1.7–2.4)

## 2015-08-11 LAB — BASIC METABOLIC PANEL
Anion gap: 4 — ABNORMAL LOW (ref 5–15)
BUN: 10 mg/dL (ref 6–20)
CALCIUM: 9 mg/dL (ref 8.9–10.3)
CHLORIDE: 113 mmol/L — AB (ref 101–111)
CO2: 22 mmol/L (ref 22–32)
CREATININE: 1.09 mg/dL (ref 0.61–1.24)
GFR calc non Af Amer: >60 mL/min (ref >60)
GLUCOSE: 101 mg/dL — AB (ref 65–99)
Potassium: 4 mmol/L (ref 3.5–5.1)
SODIUM: 139 mmol/L (ref 135–145)

## 2015-08-11 MED ORDER — COLLAGENASE 250 UNIT/GM EX OINT
TOPICAL_OINTMENT | Freq: Every day | CUTANEOUS | Status: DC
  Administered 2015-08-11 – 2015-08-12 (×2): via TOPICAL
  Filled 2015-08-11: qty 30

## 2015-08-11 NOTE — Progress Notes (Signed)
Chaplain rounded the unit to provide a compassionate and emotional support to the patient. Patient stress factor is  that he is now homeless and needs a place to stay. Oris DroneChaplain Prathik [phone].

## 2015-08-11 NOTE — Progress Notes (Signed)
PT worked with patient and isn't recommending any PT follow up at discharge. Discussed patient with RNCM. CSW is signing off but is available if a CSW need were to arise.   Woodroe Modehristina Pandora Mccrackin, MSW, LCSW-A, LCAS-A Clinical Social Worker [phone]

## 2015-08-11 NOTE — Progress Notes (Signed)
If patient is appropriate for SNF placement. Patient will need a PT Evaluation for [facility]. PT has been ordered by MD 08/10/15. CSW will continue to follow and assist.   [name], MSW, LCSW-A, LCAS-A Clinical Social Worker [phone]

## 2015-08-11 NOTE — Progress Notes (Signed)
Safety sitter at bedside at arm's length since 2215. Patient indicated that  the staff shouldn't worry about him. Suicide risk safety kit used to secure all carbinets after the room and patient's belongings were searched. Patient educated on the need to have a sitter at his bedside. Patient stated, "You're all crazy." No acute distress noted or any complain of pain. Will continue to monitor.

## 2015-08-11 NOTE — Consult Note (Signed)
WOC wound consult note Reason for Consult: Neuropathic ulcers to right foot, plantar surface, right lateral foot near fifth metatarsal and between toes.  Wound type:Neuropathic ulcers with CAD.  Pressure Ulcer POA: Yes Measurement: Right fifth toe, lateral aspect of foot:  2 cm x 1.3 cm unable to visualize wound bed due to adherent slough.  Right plantar foot near first metatarsal:  1 cm x 1 cm scabbed abrasion no drainage.  Nonintact lesions between second, third and fourth toes 0.5 cm in diameter, pink and moist Wound bed:100% slough to right lateral foot Between toes pink and moist Scabbed lesion to plantar foot Drainage (amount, consistency, odor) Minimal serosanguinous.  Musty odor to right lateral foot.  Periwound:Darkened Dressing procedure/placement/frequency:Cleanse wounds with NS.   To right lateral foot with NS and pat gently dry.  Apply Santyl to wound bed. Cover with NS moist gauze, dry 4x4 gauze and kerlix.  Calcium alginate between toes.  Cover with kerlix and tape.  Change daily.  Will not follow at this time.  Please re-consult if needed.  Maple HudsonKaren Franziska Podgurski RN BSN CWON Pager [phone]

## 2015-08-11 NOTE — Progress Notes (Signed)
Patient ID: Kenneth Gingerrthur E Perine, male   DOB: Mar 17, 1934, 81 y.o.   MRN: [id number] [employer] PROGRESS NOTE  [name] [id number] DOB: Mar 17, 1934 DOA: 08/09/2015 PCP: Baruch GoutyMelinda [name], MD  HPI/Subjective: Patient is feeling ok, sitter at bedside.  Objective: Filed Vitals:   08/11/15 1121 08/11/15 2007  BP: 137/70 147/73  Pulse: 59 61  Temp: 97.6 F (36.4 C) 97.5 F (36.4 C)  Resp: 18 18    Filed Weights   08/09/15 1600 08/10/15 0420 08/11/15 0432  Weight: 89.359 kg (197 lb) 89.132 kg (196 lb 8 oz) 88.27 kg (194 lb 9.6 oz)    ROS: Review of Systems  Constitutional: Negative for fever and chills.  Eyes: Negative for blurred vision.  Respiratory: Positive for shortness of breath. Negative for cough.   Cardiovascular: Negative for chest pain.  Gastrointestinal: Negative for nausea, vomiting, abdominal pain, diarrhea and constipation.  Genitourinary: Negative for dysuria.  Musculoskeletal: Negative for joint pain.  Neurological: Positive for dizziness. Negative for headaches.   Exam: Physical Exam  Constitutional: He is oriented to person, place, and time.  HENT:  Nose: No mucosal edema.  Mouth/Throat: No oropharyngeal exudate or posterior oropharyngeal edema.  Eyes: Conjunctivae, EOM and lids are normal. Pupils are equal, round, and reactive to light.  Neck: No JVD present. Carotid bruit is not present. No edema present. No thyroid mass and no thyromegaly present.  Cardiovascular: S1 normal and S2 normal.  Exam reveals no gallop.   No murmur heard. Pulses:      Dorsalis pedis pulses are 2+ on the right side, and 2+ on the left side.  Respiratory: No respiratory distress. He has no wheezes. He has no rhonchi. He has no rales.  GI: Soft. Bowel sounds are normal. There is no tenderness.  Musculoskeletal:       Right ankle: He exhibits no swelling.       Left ankle: He exhibits no swelling.  Lymphadenopathy:    He has no cervical adenopathy.  Neurological: He is  alert and oriented to person, place, and time. No cranial nerve deficit.  Skin: Skin is warm. No rash noted. Nails show no clubbing.  Psychiatric: He has a normal mood and affect.      Data Reviewed: Basic Metabolic Panel:  Recent Labs Lab 08/09/15 1605 08/11/15 0507  NA 138 139  K 3.1* 4.0  CL 109 113*  CO2 19* 22  GLUCOSE 201* 101*  BUN 10 10  CREATININE 1.24 1.09  CALCIUM 8.8* 9.0  MG  --  2.2   Liver Function Tests:  Recent Labs Lab 08/09/15 1605  AST 29  ALT 13*  ALKPHOS 102  BILITOT 0.8  PROT 8.6*  ALBUMIN 3.7   CBC:  Recent Labs Lab 08/09/15 1605  WBC 7.0  HGB 12.9*  HCT 37.5*  MCV 85.9  PLT 215     Studies: Ct Head Wo Contrast  08/10/2015  CLINICAL DATA:  Near syncope today. The patient states this is not new. EXAM: CT HEAD WITHOUT CONTRAST TECHNIQUE: Contiguous axial images were obtained from the base of the skull through the vertex without intravenous contrast. COMPARISON:  April 04, 2014 FINDINGS: Polyps or mucous retention cysts are seen in the maxillary sinuses. There is opacification of the left ethmoid air cells, extending into the left frontal sinus. No air-fluid levels. Cerumen is seen in the external auditory canals bilaterally. The middle ears and mastoid air cells are well aerated. No acute bony abnormalities are seen. No subdural, epidural, or  subarachnoid hemorrhage. The ventricles and sulci are stable. The cerebellum, brainstem, and basal cisterns are normal as well. White matter changes are identified, particularly adjacent to the frontal horn of the right lateral ventricle. A small lacunar infarct is seen in the left caudate head, unchanged. White matter changes are also seen near the right vertex on series 2, image 37, similar in the interval. No acute cortical ischemia or infarct is identified. No mass, mass effect, or midline shift. IMPRESSION: 1. Sinus disease as described above. 2. Scattered white matter changes as described above,  unchanged. 3. No acute cortical ischemia, infarct, or bleed is identified. Electronically Signed   By: Gerome Samavid  Williams III M.D   On: 08/10/2015 18:09    Scheduled Meds: . aspirin EC  81 mg Oral Daily  . atorvastatin  40 mg Oral QHS  . collagenase   Topical Daily  . docusate sodium  100 mg Oral BID  . enoxaparin (LOVENOX) injection  40 mg Subcutaneous Q24H  . gabapentin  300 mg Oral TID  . pantoprazole  40 mg Oral Daily  . ramipril  5 mg Oral Daily  . sodium chloride  500 mL Intravenous Once  . sodium chloride flush  3 mL Intravenous Q12H  . vitamin B-12  500 mcg Oral Daily    Assessment/Plan:  1. Near syncope, orthostatic hypotension. Held Lasix and give a fluid bolus. Could be due to dehydration. Patient was admitted with possible pacemaker failure. Case discussed with Dr. Evette GeorgesParashos cardiology and he said it is working But he needs another adapter and will need to be done as outpatient. 2. Hyperlipidemia unspecified on atorvastatin 3. Chronic kidney disease stage III 4. Gastroesophageal reflux disease on Protonix 5. Mood Disorder: no need of inpt psych. Appreciate psych input  Code Status:     Code Status Orders        Start     Ordered   08/09/15 1837  Full code   Continuous     08/09/15 1836    Code Status History    Date Active Date Inactive Code Status Order ID Comments User Context   11/14/2014  3:58 PM 11/15/2014  9:35 PM Full Code [id number]  Auburn BilberryShreyang Patel, MD Inpatient      Disposition Plan: Potentially home tomorrow, he doesn't really have home so will ask cm/sw for safe dispo. [place]  Consultants:  Cardiology  Time spent: 25 minutes  [facility], Rease Wence  [employer]

## 2015-08-11 NOTE — Care Management Important Message (Signed)
Important Message  Patient Details  Name: Delane Gingerrthur E Baham MRN: [id number] Date of Birth: 01/07/1935   Medicare Important Message Given:  Yes    Marily MemosLisa M Jesusmanuel Erbes, RN 08/11/2015, 11:14 AM

## 2015-08-11 NOTE — Evaluation (Signed)
Physical Therapy Evaluation Patient Details Name: [name] MRN: [id number] DOB: 01/13/1935 Today's Date: 08/11/2015   History of Present Illness  Pt admitted for pacemaker failure. Pt with complaints of dizziness x 1 week. Pt with history of CABG, pacemaker, COPD, DM, GERD, CAD, osteomyelitis, and peripheral neuropathy.   Clinical Impression  Pt is a pleasant 81 year old male who was admitted for pacemaker failure. Pt agitated and complains about his treatment and care during his hospital stay. Concerns relayed to RN. Pt demonstrates all bed mobility/transfers/ambulation at baseline level. Pt does not require any further PT needs at this time. Pt will be dc in house and does not require follow up. RN aware. Will dc current orders.      Follow Up Recommendations No PT follow up    Equipment Recommendations       Recommendations for Other Services       Precautions / Restrictions Precautions Precautions: None Restrictions Weight Bearing Restrictions: No      Mobility  Bed Mobility Overal bed mobility: Independent             General bed mobility comments: safe technique performed  Transfers Overall transfer level: Independent Equipment used: None             General transfer comment: safe technique performed  Ambulation/Gait Ambulation/Gait assistance: Supervision Ambulation Distance (Feet): 200 Feet Assistive device: None Gait Pattern/deviations: WFL(Within Functional Limits)     General Gait Details: ambulated using safe technique and WNL. No balance issues. Good speed noted.  Stairs            Wheelchair Mobility    Modified Rankin (Stroke Patients Only)       Balance Overall balance assessment: Independent                                           Pertinent Vitals/Pain Pain Assessment: No/denies pain    Home Living Family/patient expects to be discharged to:: Unsure                 Additional  Comments: Per chart review, pt was previously living with daughter however is now homeless. Pt does not wish to talk about it    Prior Function Level of Independence: Independent         Comments: reports he was walking 7 miles/day     Hand Dominance        Extremity/Trunk Assessment   Upper Extremity Assessment: Overall WFL for tasks assessed           Lower Extremity Assessment: Overall WFL for tasks assessed         Communication   Communication: No difficulties  Cognition Arousal/Alertness: Awake/alert Behavior During Therapy: Agitated Overall Cognitive Status: Within Functional Limits for tasks assessed                      General Comments      Exercises        Assessment/Plan    PT Assessment Patent does not need any further PT services  PT Diagnosis     PT Problem List    PT Treatment Interventions     PT Goals (Current goals can be found in the Care Plan section) Acute Rehab PT Goals Patient Stated Goal: to get out of the hospital PT Goal Formulation: All assessment and education complete, DC therapy  Time For Goal Achievement: 08/11/15 Potential to Achieve Goals: Good    Frequency     Barriers to discharge        Co-evaluation               End of Session Equipment Utilized During Treatment: Gait belt Activity Tolerance: Patient tolerated treatment well Patient left: in bed;with nursing/sitter in room Nurse Communication: Mobility status         Time: [id number] PT Time Calculation (min) (ACUTE ONLY): 9 min   Charges:   PT Evaluation $PT Eval Low Complexity: 1 Procedure     PT G Codes:        Jovonne Wilton 08/11/2015, 10:23 AM  Elizabeth PalauStephanie Tanae Petrosky, PT, DPT [phone]
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
## 2015-08-11 NOTE — Progress Notes (Signed)
[facility] Card[facility]  SUBJECTIVE: I want to go home   Filed Vitals:   08/10/15 2017 08/11/15 0432 08/11/15 0434 08/11/15 0435  BP: 143/73 134/63 133/62 116/74  Pulse: 59 60 61 69  Temp: 97.5 F (36.4 C) 98.3 F (36.8 C)    TempSrc: Oral     Resp: 18 18    Height:      Weight:  88.27 kg (194 lb 9.6 oz)    SpO2: 97% 98%       Intake/Output Summary (Last 24 hours) at 08/11/15 0813 Last data filed at 08/10/15 2044  Gross per 24 hour  Intake    240 ml  Output    600 ml  Net   -360 ml      PHYSICAL EXAM  General: Well developed, well nourished, in no acute distress HEENT:  Normocephalic and atramatic Neck:  No JVD.  Lungs: Clear bilaterally to auscultation and percussion. Heart: HRRR . Normal S1 and S2 without gallops or murmurs.  Abdomen: Bowel sounds are positive, abdomen soft and non-tender  Msk:  Back normal, normal gait. Normal strength and tone for age. Extremities: No clubbing, cyanosis or edema.   Neuro: Alert and oriented X 3. Psych:  Good affect, responds appropriately   LABS: Basic Metabolic Panel:  Recent Labs  91/47/8206/24/17 1605 08/11/15 0507  NA 138 139  K 3.1* 4.0  CL 109 113*  CO2 19* 22  GLUCOSE 201* 101*  BUN 10 10  CREATININE 1.24 1.09  CALCIUM 8.8* 9.0  MG  --  2.2   Liver Function Tests:  Recent Labs  08/09/15 1605  AST 29  ALT 13*  ALKPHOS 102  BILITOT 0.8  PROT 8.6*  ALBUMIN 3.7   No results for input(s): LIPASE, AMYLASE in the last 72 hours. CBC:  Recent Labs  08/09/15 1605  WBC 7.0  HGB 12.9*  HCT 37.5*  MCV 85.9  PLT 215   Cardiac Enzymes:  Recent Labs  08/09/15 1605  TROPONINI <0.03   BNP: Invalid input(s): POCBNP D-Dimer: No results for input(s): DDIMER in the last 72 hours. Hemoglobin A1C:  Recent Labs  08/09/15 1605  HGBA1C 5.7   Fasting Lipid Panel: No results for input(s): CHOL, HDL, LDLCALC, TRIG, CHOLHDL, LDLDIRECT in the last 72 hours. Thyroid Function Tests:  Recent Labs  08/09/15 1605  TSH  0.243*   Anemia Panel: No results for input(s): VITAMINB12, FOLATE, FERRITIN, TIBC, IRON, RETICCTPCT in the last 72 hours.  Ct Head Wo Contrast  08/10/2015  CLINICAL DATA:  Near syncope today. The patient states this is not new. EXAM: CT HEAD WITHOUT CONTRAST TECHNIQUE: Contiguous axial images were obtained from the base of the skull through the vertex without intravenous contrast. COMPARISON:  April 04, 2014 FINDINGS: Polyps or mucous retention cysts are seen in the maxillary sinuses. There is opacification of the left ethmoid air cells, extending into the left frontal sinus. No air-fluid levels. Cerumen is seen in the external auditory canals bilaterally. The middle ears and mastoid air cells are well aerated. No acute bony abnormalities are seen. No subdural, epidural, or subarachnoid hemorrhage. The ventricles and sulci are stable. The cerebellum, brainstem, and basal cisterns are normal as well. White matter changes are identified, particularly adjacent to the frontal horn of the right lateral ventricle. A small lacunar infarct is seen in the left caudate head, unchanged. White matter changes are also seen near the right vertex on series 2, image 37, similar in the interval. No acute cortical ischemia or  infarct is identified. No mass, mass effect, or midline shift. IMPRESSION: 1. Sinus disease as described above. 2. Scattered white matter changes as described above, unchanged. 3. No acute cortical ischemia, infarct, or bleed is identified. Electronically Signed   By: Gerome Samavid  Williams III M.D   On: 08/10/2015 18:09   Dg Chest Portable 1 View  08/09/2015  CLINICAL DATA:  Shortness of breath and chest tightness, dizziness, COPD, hypertension, coronary artery disease, former smoker EXAM: PORTABLE CHEST 1 VIEW COMPARISON:  Portable exam 1646 hours compared to 11/14/2014 and correlated with interval CT chest of 08/04/2015 FINDINGS: LEFT subclavian AICD leads project over RIGHT atrium, RIGHT ventricle and  coronary sinus. Upper normal heart size post median sternotomy. Mediastinal contours and pulmonary vascularity normal. Tips of lung apices excluded. No acute infiltrate, pleural effusion, or pneumothorax. Bones demineralized. Prior LEFT clavicular ORIF. IMPRESSION: Post AICD and median sternotomy. No acute abnormalities. Electronically Signed   By: Ulyses SouthwardMark  Boles M.D.   On: 08/09/2015 17:04     Echo mildly reduced left ventricular function, LVEF 40-45%  TELEMETRY: Demand pacing:  ASSESSMENT AND PLAN:  Active Problems:   Pacemaker failure    1. BiV ICD, interrogation yesterday suggests normal function, with estimated longevity of 90 days. Change out will require special adapter, cannot be done today. 2. CAD, status post CABG, with ischemic cardiomyopathy, negative troponin, no chest pain 3. Postural dizziness, chronic, appears be unrelated to ICD or heart rate 4. Patient somewhat agitated, wishes to go home  Recommendations  1. Agree with overall current therapy 2. Patient could be discharged home, undergo ICD change out as outpatient   Afsana Liera, MD, PhD, [facility] 08/11/2015 8:13 AM

## 2015-08-11 NOTE — Progress Notes (Addendum)
CSW was approached by RN stating that [phone]. CSW contacted [employer] Mikle Bosworth(Carlos- 3rd shift front Environmental education officerdesk worker). Per Mikle Boswortharlos patient arranged with the 1st shift front desk worker to have his payment for the next couple of days refunded to him. He also reports that patient gave them verbal permission to collect his belongings and have them in a box at the front desk until patient can retrieve them at discharge. Mikle BosworthCarlos reports that patient's belongings will not be thrown away and will remain at the front desk. There are no other CSW needs at this time. CSW is signing off and is available to assist if a CSW need were to arise.  Woodroe Modehristina Tanishia Lemaster, MSW, LCSW-A, LCAS-A Clinical Social Worker [phone]

## 2015-08-12 ENCOUNTER — Encounter: Payer: Medicare PPO | Admitting: Internal Medicine

## 2015-08-12 MED ORDER — ASPIRIN 81 MG PO TBEC: 81.0000 mg | DELAYED_RELEASE_TABLET | Freq: Every day | ORAL | Status: DC

## 2015-08-12 NOTE — Progress Notes (Signed)
Pt alert and oriented x4, no complaints of pain or discomfort.  Bed in low position, call bell within reach.  Bed alarms on and functioning.  Assessment done and charted.  Will continue to monitor and do hourly rounding throughout the shift 

## 2015-08-12 NOTE — Care Management (Signed)
Patient states he is connected with a PCP but does not remember the name. He denies issues obtaining medications or copays.

## 2015-08-12 NOTE — Discharge Instructions (Signed)
Dizziness °Dizziness is a common problem. It makes you feel unsteady or lightheaded. You may feel like you are about to pass out (faint). Dizziness can lead to injury if you stumble or fall. Anyone can get dizzy, but dizziness is more common in older adults. This condition can be caused by a number of things, including: °· Medicines. °· Dehydration. °· Illness. °HOME CARE °Following these instructions may help with your condition: °Eating and Drinking °· Drink enough fluid to keep your pee (urine) clear or pale yellow. This helps to keep you from getting dehydrated. Try to drink more clear fluids, such as water. °· Do not drink alcohol. °· Limit how much caffeine you drink or eat if told by your doctor. °· Limit how much salt you drink or eat if told by your doctor. °Activity °· Avoid making quick movements. °¨ When you stand up from sitting in a chair, steady yourself until you feel okay. °¨ In the morning, first sit up on the side of the bed. When you feel okay, stand slowly while you hold onto something. Do this until you know that your balance is fine. °· Move your legs often if you need to stand in one place for a long time. Tighten and relax your muscles in your legs while you are standing. °· Do not drive or use heavy machinery if you feel dizzy. °· Avoid bending down if you feel dizzy. Place items in your home so that they are easy for you to reach without leaning over. °Lifestyle °· Do not use any tobacco products, including cigarettes, chewing tobacco, or electronic cigarettes. If you need help quitting, ask your doctor. °· Try to lower your stress level, such as with yoga or meditation. Talk with your doctor if you need help. °General Instructions °· Watch your dizziness for any changes. °· Take medicines only as told by your doctor. Talk with your doctor if you think that your dizziness is caused by a medicine that you are taking. °· Tell a friend or a family member that you are feeling dizzy. If he or  she notices any changes in your behavior, have this person call your doctor. °· Keep all follow-up visits as told by your doctor. This is important. °GET HELP IF: °· Your dizziness does not go away. °· Your dizziness or light-headedness gets worse. °· You feel sick to your stomach (nauseous). °· You have trouble hearing. °· You have new symptoms. °· You are unsteady on your feet or you feel like the room is spinning. °GET HELP RIGHT AWAY IF: °· You throw up (vomit) or have diarrhea and are unable to eat or drink anything. °· You have trouble: °¨ Talking. °¨ Walking. °¨ Swallowing. °¨ Using your arms, hands, or legs. °· You feel generally weak. °· You are not thinking clearly or you have trouble forming sentences. It may take a friend or family member to notice this. °· You have: °¨ Chest pain. °¨ Pain in your belly (abdomen). °¨ Shortness of breath. °¨ Sweating. °· Your vision changes. °· You are bleeding. °· You have a headache. °· You have neck pain or a stiff neck. °· You have a fever. °  °This information is not intended to replace advice given to you by your health care provider. Make sure you discuss any questions you have with your health care provider. °  °Document Released: 01/21/2011 Document Revised: 06/18/2014 Document Reviewed: 01/28/2014 °Elsevier Interactive Patient Education ©2016 [employer] ° °

## 2015-08-12 NOTE — Progress Notes (Signed)
Pt IV and tele d/ced.  Asked pt where will he be going and he stated he will continue to stay in his truck in the hospital parking lot.  Gave discharge instructions and one prescription.  Also cracker, peanut butter and juices were given to the pt to have to eat later.  Will continue the discharge process.

## 2015-08-12 NOTE — Progress Notes (Signed)
Pt fussing about not being able to afford somewhere to stay after discharge.  Asked the social worker to come in and talk with the pt regarding this isssue.  Pt is being discharged from the hospital and is very angry about leaving and not having anywhere to go.  Encouraged pt to reach out to his family and he totally refused to reach out to the family.  Pt was told that the discharge process will start at 1430.

## 2015-08-13 ENCOUNTER — Telehealth: Payer: Self-pay | Admitting: Family Medicine

## 2015-08-13 MED ORDER — RAMIPRIL 5 MG PO CAPS: 5.0000 mg | ORAL_CAPSULE | Freq: Every day | ORAL | Status: DC

## 2015-08-13 NOTE — Telephone Encounter (Signed)
[place] [address] asking [place] 90 [place] supply; rx sent

## 2015-08-14 NOTE — Discharge Summary (Signed)
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 COPD (chronic obstructive pulmonary disease) (HCC)   . Diabetes mellitus without complication (HCC)   . History of blood clots   . GERD (gastroesophageal reflux disease)   . Hyperlipidemia   . Hypertension   . CAD (coronary artery disease)   . Osteomyelitis (HCC)   . Arthritis   . PN (peripheral neuropathy) (HCC)   . 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 Literacy level of illiterate     HOSPITAL COURSE:  This is a 81 year old male admitted for pacemaker failure.  1. Near syncope, orthostatic hypotension. Held Lasix and give a fluid bolus. Could be due to dehydration. Case discussed with Dr. Evette GeorgesParashos cardiology and he said it is working FINE. But he needs another adapter and will need to be done as outpatient. 2. Hyperlipidemia unspecified on atorvastatin 3. Chronic kidney disease stage III 4. Gastroesophageal reflux disease on Protonix 5. Mood Disorder: no need of inpt psych. Appreciate psych input  DISCHARGE CONDITIONS:   stable  CONSULTS OBTAINED:  Treatment Team:  Marcina MillardAlexander Paraschos, MD Audery AmelJohn T Clapacs, MD  DRUG ALLERGIES:   Allergies  Allergen Reactions  . Oxycodone-Acetaminophen Other (See Comments)    Causes the pt to be very irritable, says he can take this  .  Percocet [Oxycodone-Acetaminophen] Other (See Comments)    Reaction:  Irritability     DISCHARGE MEDICATIONS:   Discharge Medication List as of 08/12/2015  1:30 PM    START taking these medications   Details  aspirin EC 81 MG EC tablet Take 1 tablet (81 mg total) by mouth daily., Starting 08/12/2015, Until Discontinued, Print      CONTINUE these medications which have NOT CHANGED   Details  atorvastatin (LIPITOR) 40 MG tablet Take 1 tablet (40 mg total) by mouth at bedtime., Starting 07/23/2015, Until Discontinued, Normal    furosemide (LASIX) 20 MG tablet Take 1 tablet (20 mg total) by mouth daily., Starting 07/24/2015, Until Discontinued, Normal    gabapentin (NEURONTIN) 300 MG capsule Take 1 capsule (300 mg total) by mouth 3 (three) times daily., Starting 07/23/2015, Until Discontinued, Normal    Omega-3 Fatty Acids (FISH OIL) 600 MG CAPS Take 300 mg by mouth 2 (two) times daily., Until Discontinued, Historical Med    omeprazole (PRILOSEC) 40 MG capsule Take 1 capsule (40 mg total) by mouth daily., Starting 07/23/2015, Until Discontinued, Normal    OVER THE COUNTER MEDICATION Take 2 capsules by mouth daily. Pt takes OmegaXL., Until Discontinued, Historical Med    vitamin B-12 (CYANOCOBALAMIN) 500 MCG tablet Take 500 mcg by mouth daily., Until Discontinued, Historical Med    ramipril (ALTACE) 5 MG capsule Take 1 capsule (5 mg total) by mouth daily., Starting 07/23/2015, Until Discontinued, Normal         DISCHARGE INSTRUCTIONS:    DIET:  Regular diet  DISCHARGE CONDITION:  Good  ACTIVITY:  Activity as tolerated  OXYGEN:  Home Oxygen: No.   Oxygen Delivery: room air  DISCHARGE LOCATION:  home   If you experience worsening of your admission symptoms, develop shortness of breath, life threatening emergency, suicidal or homicidal thoughts you must seek medical attention immediately by calling 911 or calling your MD immediately  if symptoms less severe.  You Must read  complete instructions/literature along with all the possible adverse reactions/side effects for all the Medicines you take and that have been prescribed to you. Take any new Medicines after you have completely understood and accpet all the possible adverse reactions/side effects.   Please note  You were cared for by a hospitalist during your hospital stay. If you have any questions about your discharge medications or the care you received while you were in the hospital after you are discharged, you can call the unit and asked to speak with the hospitalist on call if the hospitalist that took care of you is not available. Once you are discharged, your primary care physician will handle any further medical issues. Please note that NO REFILLS for any discharge medications will be authorized once you are discharged, as it is imperative that you return to your primary care physician (or establish a relationship with a primary care physician if you do not have one) for your aftercare needs so that they can reassess your need for medications and monitor your lab values.    On the day of Discharge:  VITAL SIGNS:  Blood pressure 132/78, pulse 72, temperature 97.1 F (36.2 C), temperature source Tympanic, resp. rate 18, height 6' (1.829 m), weight 87.136 kg (192 lb 1.6 oz), SpO2 96 %. PHYSICAL EXAMINATION:  GENERAL:  81 y.o.-year-old patient lying in the bed with no acute distress.  EYES: Pupils equal, round, reactive to light and accommodation. No scleral icterus. Extraocular muscles intact.  HEENT: Head atraumatic, normocephalic. Oropharynx and nasopharynx clear.  NECK:  Supple, no jugular venous distention. No thyroid enlargement, no tenderness.  LUNGS: Normal breath sounds bilaterally, no wheezing, rales,rhonchi or crepitation. No use of accessory muscles of respiration.  CARDIOVASCULAR: S1, S2 normal. No murmurs, rubs, or gallops.  ABDOMEN: Soft, non-tender, non-distended. Bowel sounds present. No  organomegaly or mass.  EXTREMITIES: No pedal edema, cyanosis, or clubbing.  NEUROLOGIC: Cranial nerves II through XII are intact. Muscle strength 5/5 in all extremities. Sensation intact. Gait not checked.  PSYCHIATRIC: The patient is alert and oriented x 3.  SKIN: No obvious rash, lesion, or ulcer.  DATA REVIEW:   CBC  Recent Labs Lab 08/09/15 1605  WBC 7.0  HGB 12.9*  HCT 37.5*  PLT 215    Chemistries   Recent Labs Lab 08/09/15 1605 08/11/15 0507  NA 138 139  K 3.1* 4.0  CL 109 113*  CO2 19* 22  GLUCOSE 201* 101*  BUN 10 10  CREATININE 1.24 1.09  CALCIUM 8.8* 9.0  MG  --  2.2  AST 29  --   ALT 13*  --   ALKPHOS 102  --   BILITOT 0.8  --     Management plans discussed with the patient, family and they are in agreement.  CODE STATUS: FULL CODE  TOTAL TIME TAKING CARE OF THIS PATIENT: 45 minutes.    [facility], Britain Saber M.D on 08/14/2015 at 10:39 PM  Between 7am to 6pm - Pager - [phone]  After 6pm go to [email or web address] - password [email or web address]  [facility]  Office  [phone]  CC: Primary care physician;  Baruch Gouty, MD   Note: This dictation was prepared with Dragon dictation along with smaller phrase technology. Any transcriptional errors that result from this process are unintentional.

## 2015-09-17 ENCOUNTER — Telehealth: Payer: Self-pay | Admitting: Family Medicine

## 2015-09-17 ENCOUNTER — Encounter: Payer: Self-pay | Admitting: Family Medicine

## 2015-09-17 NOTE — Telephone Encounter (Signed)
On your desk

## 2015-09-17 NOTE — Telephone Encounter (Signed)
I talked w/patient They didn't have the machine at the cardiologist He has not been back and has not called ------------------------ [name], he needs his pacemaker interrogated ASAP; please coordinate with patient and cardiologist; patient needs some assistance so make sure he knows when and where to go Also, please update med list (copies in tray, just leave out the heparin flush and betadine and glucagon); thank you! Patient also needs appt with me soon

## 2015-09-17 NOTE — Telephone Encounter (Signed)
On your deck paperwork

## 2015-09-18 ENCOUNTER — Emergency Department: Payer: Medicare PPO

## 2015-09-18 ENCOUNTER — Encounter: Payer: Self-pay | Admitting: *Deleted

## 2015-09-18 ENCOUNTER — Emergency Department
Admission: EM | Admit: 2015-09-18 | Discharge: 2015-09-18 | Disposition: A | Discharge status: Home / Self Care | Payer: Medicare PPO | Attending: Emergency Medicine | Admitting: Emergency Medicine

## 2015-09-18 DIAGNOSIS — I10 Essential (primary) hypertension: Secondary | ICD-10-CM | POA: Insufficient documentation

## 2015-09-18 DIAGNOSIS — L97519 Non-pressure chronic ulcer of other part of right foot with unspecified severity: Secondary | ICD-10-CM | POA: Diagnosis not present

## 2015-09-18 DIAGNOSIS — Y69 Unspecified misadventure during surgical and medical care: Secondary | ICD-10-CM | POA: Insufficient documentation

## 2015-09-18 DIAGNOSIS — E11621 Type 2 diabetes mellitus with foot ulcer: Secondary | ICD-10-CM | POA: Diagnosis not present

## 2015-09-18 DIAGNOSIS — I2510 Atherosclerotic heart disease of native coronary artery without angina pectoris: Secondary | ICD-10-CM | POA: Diagnosis not present

## 2015-09-18 DIAGNOSIS — J449 Chronic obstructive pulmonary disease, unspecified: Secondary | ICD-10-CM | POA: Insufficient documentation

## 2015-09-18 DIAGNOSIS — Z87891 Personal history of nicotine dependence: Secondary | ICD-10-CM | POA: Insufficient documentation

## 2015-09-18 DIAGNOSIS — T814XXA Infection following a procedure, initial encounter: Secondary | ICD-10-CM | POA: Diagnosis present

## 2015-09-18 DIAGNOSIS — Z7982 Long term (current) use of aspirin: Secondary | ICD-10-CM | POA: Diagnosis not present

## 2015-09-18 LAB — CBC WITH DIFFERENTIAL/PLATELET
Basophils Absolute: 0 10*3/uL (ref 0–0.1)
Basophils Relative: 1 %
EOS ABS: 0.6 10*3/uL (ref 0–0.7)
HCT: 38.2 % — ABNORMAL LOW (ref 40.0–52.0)
Hemoglobin: 13.1 g/dL (ref 13.0–18.0)
LYMPHS ABS: 1.3 10*3/uL (ref 1.0–3.6)
Lymphocytes Relative: 18 %
MCH: 30.5 pg (ref 26.0–34.0)
MCHC: 34.2 g/dL (ref 32.0–36.0)
MCV: 89.1 fL (ref 80.0–100.0)
MONO ABS: 1.1 10*3/uL — AB (ref 0.2–1.0)
Neutro Abs: 3.9 10*3/uL (ref 1.4–6.5)
Neutrophils Relative %: 56 %
PLATELETS: 188 10*3/uL (ref 150–440)
RBC: 4.29 MIL/uL — ABNORMAL LOW (ref 4.40–5.90)
RDW: 17.1 % — AB (ref 11.5–14.5)
WBC: 7 10*3/uL (ref 3.8–10.6)

## 2015-09-18 LAB — BASIC METABOLIC PANEL
Anion gap: 8 (ref 5–15)
BUN: 17 mg/dL (ref 6–20)
CALCIUM: 9.6 mg/dL (ref 8.9–10.3)
CHLORIDE: 106 mmol/L (ref 101–111)
CO2: 24 mmol/L (ref 22–32)
CREATININE: 1.22 mg/dL (ref 0.61–1.24)
GFR calc Af Amer: >60 mL/min (ref >60)
GFR, EST NON AFRICAN AMERICAN: 55 mL/min — AB (ref >60)
Glucose, Bld: 85 mg/dL (ref 65–99)
Potassium: 3.8 mmol/L (ref 3.5–5.1)
SODIUM: 138 mmol/L (ref 135–145)

## 2015-09-18 MED ORDER — CLINDAMYCIN HCL 300 MG PO CAPS: 300.0000 mg | ORAL_CAPSULE | Freq: Three times a day (TID) | ORAL | 0 refills | Status: AC

## 2015-09-18 MED ORDER — CLINDAMYCIN PHOSPHATE 600 MG/50ML IV SOLN
600.0000 mg | Freq: Once | INTRAVENOUS | Status: AC
  Administered 2015-09-18: 600 mg via INTRAVENOUS
  Filled 2015-09-18: qty 50

## 2015-09-18 MED ORDER — GABAPENTIN 300 MG PO CAPS
ORAL_CAPSULE | ORAL | Status: AC
  Administered 2015-09-18: 300 mg via ORAL
  Filled 2015-09-18: qty 1

## 2015-09-18 MED ORDER — GABAPENTIN 300 MG PO CAPS
300.0000 mg | ORAL_CAPSULE | Freq: Once | ORAL | Status: AC
  Administered 2015-09-18: 300 mg via ORAL

## 2015-09-18 NOTE — Discharge Instructions (Signed)
Take antibiotics as prescribed. Follow-up with Dr. Alberteen Spindle on Monday. Call his office tomorrow to find out the time of your appointment. Return to the emergency department if you have pain in your foot, pus, redness, or fever as these are signs of an infection. Otherwise keep dressing intact and dry until you see Dr. Alberteen Spindle in the office.

## 2015-09-18 NOTE — Telephone Encounter (Signed)
Called got appt setup for Aug 22 @ 9:15 and they will put him on cancellation list. Tried to call patient and he said he will come by later to get information about appt

## 2015-09-18 NOTE — ED Provider Notes (Signed)
[facility] Emergency Department Provider Note  ____________________________________________  Time seen: Approximately 3:10 PM  I have reviewed the triage vital signs and the nursing notes.   HISTORY  Chief Complaint Post-op Problem and Wound Infection   HPI [name] is a 81 y.o. male surgery of diabetes, CAD s/p pacemaker, COPD, hypertension, HLD who presents from his podiatrist clinic for evaluation of right foot ulcer. Patient has a history of chronic ulcers on his feet. He is an extremely poor historian however reports that in April or May he underwent debridement by a podiatrist in [place]. Unclear if he was placed on antibiotics. He then moved to [facility] and has been seeing Linus Galas, podiatry since June. He was last seen by Linus Galas in 6/21 where his wound were debrided and XRs did not show bone infection. Today, he went for a follow up appointment and patient was sent here "because the podiatrist saw something in his foot XR that made him concern". Patient denies fever, nausea, vomiting, purulent drainage from the foot. His podiatrist has been trying to get patient to see vascular surgery due to ho multiple slow healing ulcers however patient has not scheduled appointment yet. Patient denies any pain in his foot but states that he has no sensation on that foot. Patient is unable to provide any further history.  Past Medical History:  Diagnosis Date  . Arthritis   . CAD (coronary artery disease)   . COPD (chronic obstructive pulmonary disease) (HCC)   . Diabetes mellitus without complication (HCC)   . GERD (gastroesophageal reflux disease)   . History of blood clots   . 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 Hyperlipidemia   . Hypertension   . Literacy level of illiterate   . Osteomyelitis (HCC)   . PN (peripheral neuropathy) [facility])     Patient Active Problem List   Diagnosis Date Noted  . Major neurocognitive disorder as late effect  of traumatic brain injury with behavioral disturbance (HCC) 08/11/2015  . 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 Abnormal serum protein electrophoresis 12/05/2014  . Pressure ulcer 11/15/2014  . Syncope and collapse 11/14/2014  . Hyperproteinemia 11/02/2014  . Hypoalbuminemia 11/02/2014  . Arthritis 10/23/2014  . Arteriosclerosis of coronary artery 10/23/2014  . Diabetic foot ulcer associated with type 2 diabetes mellitus (HCC) 10/23/2014  . Amputation of finger of left hand 10/23/2014  . History of pulmonary embolism 10/23/2014  . Literacy problem 10/23/2014  . Umbilical hernia without obstruction or gangrene 10/23/2014  . Vitamin B12 deficiency 10/23/2014  . Neuropathy of lower extremity 10/23/2014  . History of osteomyelitis 10/23/2014  . Complication of diabetes mellitus (HCC) 03/27/2014  . Artificial cardiac pacemaker 08/10/2013  . H/O coronary artery bypass surgery 08/10/2013  . Benign prostatic hyperplasia with urinary obstruction 09/27/2012  . Abnormal prostate specific antigen 09/27/2012    Past Surgical History:  Procedure Laterality Date  . amputation 4th finger Left   . APPENDECTOMY    . CLAVICLE SURGERY  2012   open reduction and internal fixation of left clavicle  . CORONARY ARTERY BYPASS GRAFT  2002  . FOOT SURGERY    . PACEMAKER INSERTION      Prior to Admission medications   Medication Sig Start Date End Date Taking? Authorizing Provider  aspirin EC 81 MG EC tablet Take 1 tablet (81 mg total) by mouth daily. 08/12/15   Delfino Lovett, MD  atorvastatin (LIPITOR) 40 MG tablet Take 1 tablet (40 mg total) by  mouth at bedtime. 07/23/15   Kerman Passey, MD  clindamycin (CLEOCIN) 300 MG capsule Take 1 capsule (300 mg total) by mouth 3 (three) times daily. 09/18/15 09/25/15  Nita Sickle, MD  furosemide (LASIX) 20 MG tablet Take 1 tablet (20 mg total) by mouth daily. 07/24/15   Kerman Passey, MD  gabapentin (NEURONTIN) 300 MG capsule Take 1  capsule (300 mg total) by mouth 3 (three) times daily. 07/23/15   Kerman Passey, MD  Omega-3 Fatty Acids (FISH OIL) 600 MG CAPS Take 300 mg by mouth 2 (two) times daily.    Historical Provider, MD  omeprazole (PRILOSEC) 40 MG capsule Take 1 capsule (40 mg total) by mouth daily. 07/23/15   Kerman Passey, MD  OVER THE COUNTER MEDICATION Take 2 capsules by mouth daily. Pt takes OmegaXL.    Historical Provider, MD  ramipril (ALTACE) 5 MG capsule Take 1 capsule (5 mg total) by mouth daily. 08/13/15   Kerman Passey, MD  vitamin B-12 (CYANOCOBALAMIN) 500 MCG tablet Take 500 mcg by mouth daily.    Historical Provider, MD    Allergies Oxycodone-acetaminophen and Percocet [oxycodone-acetaminophen]  Family History  Problem Relation Age of Onset  . Cancer Mother     spine  . Hypertension Mother   . Mental illness Mother   . Cancer Father     lung  . Asthma Father   . Allergic rhinitis Father   . Arthritis Father     Social History Social History  Substance Use Topics  . Smoking status: Former Smoker    Packs/day: 0.25    Types: Cigarettes, Cigars    Quit date: 10/18/1979  . Smokeless tobacco: Never Used  . Alcohol use No    Review of Systems  Constitutional: Negative for fever. Eyes: Negative for visual changes. ENT: Negative for sore throat. Cardiovascular: Negative for chest pain. Respiratory: Negative for shortness of breath. Gastrointestinal: Negative for abdominal pain, vomiting or diarrhea. Genitourinary: Negative for dysuria. Musculoskeletal: Negative for back pain. Skin: Negative for rash. + ulcers on his R foot Neurological: Negative for headaches, weakness or numbness.  ____________________________________________   PHYSICAL EXAM:  VITAL SIGNS: ED Triage Vitals [09/18/15 1202]  Enc Vitals Group     BP (!) 145/64     Pulse Rate 77     Resp 16     Temp 98.3 F (36.8 C)     Temp Source Oral     SpO2 97 %     Weight 210 lb (95.3 kg)     Height  (1.778 m)      Head Circumference      Peak Flow      Pain Score      Pain Loc      Pain Edu?      Excl. in GC?     Constitutional: Alert and oriented. Well appearing and in no apparent distress. HEENT:      Head: Normocephalic and atraumatic.         Eyes: Conjunctivae are normal. Sclera is non-icteric. EOMI. PERRL      Mouth/Throat: Mucous membranes are moist.       Neck: Supple with no signs of meningismus. Cardiovascular: Regular rate and rhythm. No murmurs, gallops, or rubs. 2+ symmetrical distal pulses are present in all extremities. No JVD. Respiratory: Normal respiratory effort. Lungs are clear to auscultation bilaterally. No wheezes, crackles, or rhonchi.  Musculoskeletal: Nontender with normal range of motion in all extremities. Chronic venous stasis changes  on the R foot, no erythema/ warmth/ cyanosis. Neurologic: Normal speech and language. Face is symmetric. Moving all extremities. No gross focal neurologic deficits are appreciated. Skin: + R shallow ulcer located on the lateral aspect of the 5th toe with fruity odor but no purulent discharge. There is an ulcer at the base of nail of R 3rd digit with mild surrounding erythema. Also there is a well healed ulcer located in the sole of the R foot over the 1st metatarsal bone. Psychiatric: Mood and affect are normal. Speech and behavior are normal.  ____________________________________________   LABS (all labs ordered are listed, but only abnormal results are displayed)  Labs Reviewed  CBC WITH DIFFERENTIAL/PLATELET - Abnormal; Notable for the following:       Result Value   RBC 4.29 (*)    HCT 38.2 (*)    RDW 17.1 (*)    Monocytes Absolute 1.1 (*)    All other components within normal limits  BASIC METABOLIC PANEL - Abnormal; Notable for the following:    GFR calc non Af Amer 55 (*)    All other components within normal limits  AEROBIC CULTURE (SUPERFICIAL SPECIMEN)    ____________________________________________  EKG  none ____________________________________________  RADIOLOGY  Foot XR:  1. Rapidly developed first MTP arthropathy with joint destruction and dorsal dislocation. Findings primarily concerning for septic joint. 2. Presumed distal fifth metatarsal resection. The osteotomy is more irregular than expected and osteomyelitis is a possibility in this location as well. ____________________________________________   PROCEDURES  Procedure(s) performed:yes Aspiration of blood/fluid Date/Time: 09/18/2015 4:20 PM Performed by: Nita Sickle Authorized by: Nita Sickle  Consent: Verbal consent obtained. Risks and benefits: risks, benefits and alternatives were discussed Consent given by: patient Patient understanding: patient states understanding of the procedure being performed Patient consent: the patient's understanding of the procedure matches consent given Patient identity confirmed: verbally with patient Time out: Immediately prior to procedure a "time out" was called to verify the correct patient, procedure, equipment, support staff and site/side marked as required. Preparation: Patient was prepped and draped in the usual sterile fashion. Local anesthesia used: no  Anesthesia: Local anesthesia used: no  Sedation: Patient sedated: no Patient tolerance: Patient tolerated the procedure well with no immediate complications Comments: [place] used to identify very small amount of joint fluid in the R 1st MTP joint. Sterile technique used, chlorhexidine for skin sterilization. Using anatomical landmarks and [place], 20 gauge needle was inserted into the 1st MTP joint with no fluid obtain. Wound dressed with bandaid.      Critical Care performed:  None ____________________________________________   INITIAL IMPRESSION / ASSESSMENT AND PLAN / ED COURSE   81 y.o. male surgery of diabetes, CAD s/p pacemaker, COPD, hypertension,  HLD who presents from his podiatrist clinic for evaluation of right foot ulcer. Patient denies pain or fever However according to the patient and his podiatrist saw something on x-ray today that was concerning and sent him here for evaluation. Plan to repeat the x-ray as I am unable to see that imaging and we'll also get basic labs. I do not see any purulent discharge or surrounding cellulitis however there is a fruity odor coming from the wound. We'll send swabs from the wound for culture.  Clinical Course  Comment By Time  X-ray concernin for possible septic first MTP joint. Patient has full range of motion with no pain, no warmth, no erythema of that joint. I attempted to perform and after thoracentesis on that joint for fluid analysis however  there is very little if any fluid in the joint and I have been unable to collected. X-rays also concerning for possible osteo of 5th distal metatarsal. I discussed patient with Dr. Orland Jarred who will look at [facility] and labs to determine management.  Nita Sickle, MD 08/03 1627  Dr. Orland Jarred evaluated XRs and report no change from XR done at the clinic 1 month ago showing Charcot arthropathy. Since patient has no signs of active infection at this time with no erythema, no warmth, no swelling, no pain, no fever, Dr. Orland Jarred recommended starting patient on clindamycin, packing wound and applying dressing. They will f/u patient in clinic on Monday. Labs pending at this time. No indication for admission or surgery at this time per Dr. Orland Jarred. Nita Sickle, MD 08/03 1639  WBC negative, will dc home at this time with clindamycin and close f/u with Podiatry Nita Sickle, MD 08/03 1712    Pertinent labs & imaging results that were available during my care of the patient were reviewed by me and considered in my medical decision making (see chart for details).    ____________________________________________   FINAL CLINICAL IMPRESSION(S) / ED  DIAGNOSES  Final diagnoses:  Diabetic ulcer of right foot associated with type 2 diabetes mellitus (HCC)      NEW MEDICATIONS STARTED DURING THIS VISIT:  New Prescriptions   CLINDAMYCIN (CLEOCIN) 300 MG CAPSULE    Take 1 capsule (300 mg total) by mouth 3 (three) times daily.     Note:  This document was prepared using Dragon voice recognition software and may include unintentional dictation errors.    Nita Sickle, MD 09/18/15 1743

## 2015-09-18 NOTE — ED Triage Notes (Signed)
Pt arrived to ED reporting he was sent from "the doctor downstairs" to be seen in ED after "the xray of my right foot showed something he did not like" Pt is 22 days post op after he had bones removed from right foot. Pt has no sensation in right foot but reports this is not new. Foot is dark drown in color but pulse is palpated and strong. Pt denies having fevers and pain at this time. Pt able to walk on foot with assistance of surgical boot.

## 2015-09-22 ENCOUNTER — Telehealth: Payer: Self-pay | Admitting: Family Medicine

## 2015-09-22 LAB — AEROBIC CULTURE W GRAM STAIN (SUPERFICIAL SPECIMEN): Gram Stain: NONE SEEN

## 2015-09-22 LAB — AEROBIC CULTURE  (SUPERFICIAL SPECIMEN)

## 2015-09-23 ENCOUNTER — Emergency Department
Admission: EM | Admit: 2015-09-23 | Discharge: 2015-09-23 | Disposition: A | Discharge status: Home / Self Care | Payer: Medicare PPO | Attending: Emergency Medicine | Admitting: Emergency Medicine

## 2015-09-23 ENCOUNTER — Telehealth: Payer: Self-pay | Admitting: Family Medicine

## 2015-09-23 ENCOUNTER — Encounter: Payer: Self-pay | Admitting: Medical Oncology

## 2015-09-23 DIAGNOSIS — Z87891 Personal history of nicotine dependence: Secondary | ICD-10-CM | POA: Insufficient documentation

## 2015-09-23 DIAGNOSIS — I10 Essential (primary) hypertension: Secondary | ICD-10-CM | POA: Insufficient documentation

## 2015-09-23 DIAGNOSIS — Z7982 Long term (current) use of aspirin: Secondary | ICD-10-CM | POA: Insufficient documentation

## 2015-09-23 DIAGNOSIS — J449 Chronic obstructive pulmonary disease, unspecified: Secondary | ICD-10-CM | POA: Insufficient documentation

## 2015-09-23 DIAGNOSIS — E11621 Type 2 diabetes mellitus with foot ulcer: Secondary | ICD-10-CM | POA: Insufficient documentation

## 2015-09-23 DIAGNOSIS — Z79899 Other long term (current) drug therapy: Secondary | ICD-10-CM | POA: Diagnosis not present

## 2015-09-23 DIAGNOSIS — L97519 Non-pressure chronic ulcer of other part of right foot with unspecified severity: Secondary | ICD-10-CM | POA: Insufficient documentation

## 2015-09-23 DIAGNOSIS — M79671 Pain in right foot: Secondary | ICD-10-CM | POA: Diagnosis present

## 2015-09-23 DIAGNOSIS — I2510 Atherosclerotic heart disease of native coronary artery without angina pectoris: Secondary | ICD-10-CM | POA: Insufficient documentation

## 2015-09-23 DIAGNOSIS — L97509 Non-pressure chronic ulcer of other part of unspecified foot with unspecified severity: Secondary | ICD-10-CM

## 2015-09-23 DIAGNOSIS — E13621 Other specified diabetes mellitus with foot ulcer: Secondary | ICD-10-CM

## 2015-09-23 NOTE — Telephone Encounter (Signed)
Patient came by office wanting needing his foot re-wrapped.  I called and spoke with Selena BattenKim the nurse that works with [facility], who Mr. Liburd sees.  Kim informed me to tell the patient to come at 2:15pm today to have his foot checked and re-wrapped.  I [facility] the time above.  Patient stated that he couldn't wait that and that he might go to the ER.

## 2015-09-23 NOTE — ED Notes (Signed)
New dressing applied to foot

## 2015-09-23 NOTE — Telephone Encounter (Signed)
Please have him contact his podiatrist for all questions and concerns regarding his foot and dressings Thank you

## 2015-09-23 NOTE — ED Triage Notes (Signed)
Pt ambulatory to triage with reports that he was seen here on 8/3 and told to see a podiatrist. Pt called them this am and was told he could be seen at 2:15 today to have his rt foot re-wrapped and pt told them that was too long to wait.

## 2015-09-23 NOTE — Discharge Instructions (Signed)
Follow-up with scheduled podiatry appointment today at 2:15.

## 2015-09-23 NOTE — ED Provider Notes (Signed)
[facility] Emergency Department Provider Note   ____________________________________________   First MD Initiated Contact with Patient 09/23/15 1042     (approximate)  I have reviewed the triage vital signs and the nursing notes.   HISTORY  Chief Complaint Foot Pain    HPI [name] is a 81 y.o. male patient requesting dressing change for diabetic ulcers of the right foot. Patient is scheduled to see podiatry 2:15 today. State he cannot wait that long to have his dressing change. Patient was dressing change was 09/19/2015.Patient denies any pain to this complaint. No other palliative measures prior to arrival.  Past Medical History:  Diagnosis Date  . Arthritis   . CAD (coronary artery disease)   . COPD (chronic obstructive pulmonary disease) (HCC)   . Diabetes mellitus without complication (HCC)   . GERD (gastroesophageal reflux disease)   . History of blood clots   . 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 Hyperlipidemia   . Hypertension   . Literacy level of illiterate   . Osteomyelitis (HCC)   . PN (peripheral neuropathy) [facility])     Patient Active Problem List   Diagnosis Date Noted  . 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 Abnormal serum protein electrophoresis 12/05/2014  . Pressure ulcer 11/15/2014  . Syncope and collapse 11/14/2014  . Hyperproteinemia 11/02/2014  . Hypoalbuminemia 11/02/2014  . Arthritis 10/23/2014  . Arteriosclerosis of coronary artery 10/23/2014  . Diabetic foot ulcer associated with type 2 diabetes mellitus (HCC) 10/23/2014  . Amputation of finger of left hand 10/23/2014  . History of pulmonary embolism 10/23/2014  . Literacy problem 10/23/2014  . Umbilical hernia without obstruction or gangrene 10/23/2014  . Vitamin B12 deficiency 10/23/2014  . Neuropathy of  lower extremity 10/23/2014  . History of osteomyelitis 10/23/2014  . Complication of diabetes mellitus (HCC) 03/27/2014  . Artificial cardiac pacemaker 08/10/2013  . H/O coronary artery bypass surgery 08/10/2013  . Benign prostatic hyperplasia with urinary obstruction 09/27/2012  . Abnormal prostate specific antigen 09/27/2012    Past Surgical History:  Procedure Laterality Date  . amputation 4th finger Left   . APPENDECTOMY    . CLAVICLE SURGERY  2012   open reduction and internal fixation of left clavicle  . CORONARY ARTERY BYPASS GRAFT  2002  . FOOT SURGERY    . PACEMAKER INSERTION      Prior to Admission medications   Medication Sig Start Date End Date Taking? Authorizing Provider  aspirin EC 81 MG EC tablet Take 1 tablet (81 mg total) by mouth daily. 08/12/15   Delfino Lovett, MD  atorvastatin (LIPITOR) 40 MG tablet Take 1 tablet (40 mg total) by mouth at bedtime. 07/23/15   Kerman Passey, MD  clindamycin (CLEOCIN) 300 MG capsule Take 1 capsule (300 mg total) by mouth 3 (three) times daily. 09/18/15 09/25/15  Nita Sickle, MD  furosemide (LASIX) 20 MG tablet Take 1 tablet (20 mg total) by mouth daily. 07/24/15   Kerman Passey, MD  gabapentin (NEURONTIN) 300 MG capsule Take 1 capsule (300 mg total) by mouth 3 (three) times daily. 07/23/15   Kerman Passey, MD  Omega-3 Fatty Acids (FISH OIL) 600 MG CAPS Take 300 mg by mouth 2 (two) times daily.    Historical Provider, MD  omeprazole (PRILOSEC) 40 MG capsule Take 1 capsule (40 mg total) by mouth daily. 07/23/15   Melinda P  [name], MD  OVER THE COUNTER MEDICATION Take 2 capsules by mouth daily. Pt takes OmegaXL.    Historical Provider, MD  ramipril (ALTACE) 5 MG capsule Take 1 capsule (5 mg total) by mouth daily. 08/13/15   Kerman PasseyMelinda P [name], MD  vitamin B-12 (CYANOCOBALAMIN) 500 MCG tablet Take 500 mcg by mouth daily.    Historical Provider, MD    Allergies Oxycodone-acetaminophen and Percocet [oxycodone-acetaminophen]  Family History    Problem Relation Age of Onset  . Cancer Mother     spine  . Hypertension Mother   . Mental illness Mother   . Cancer Father     lung  . Asthma Father   . Allergic rhinitis Father   . Arthritis Father     Social History Social History  Substance Use Topics  . Smoking status: Former Smoker    Packs/day: 0.25    Types: Cigarettes, Cigars    Quit date: 10/18/1979  . Smokeless tobacco: Never Used  . Alcohol use No    Review of Systems Constitutional: No fever/chills Eyes: No visual changes. ENT: No sore throat. Cardiovascular: Denies chest pain. Respiratory: Denies shortness of breath. Gastrointestinal: No abdominal pain.  No nausea, no vomiting.  No diarrhea.  No constipation. Genitourinary: Negative for dysuria. Musculoskeletal: Negative for back pain. Skin: Negative for rash.Foot ulcers Neurological: Negative for headaches, focal weakness or numbness. Endocrine:Hypertension, hyperlipidemia and diabetes. ____________________________________________   PHYSICAL EXAM:  VITAL SIGNS: ED Triage Vitals  Enc Vitals Group     BP 09/23/15 0948 140/64     Pulse Rate 09/23/15 0948 69     Resp 09/23/15 0948 18     Temp 09/23/15 0948 97.9 F (36.6 C)     Temp Source 09/23/15 0948 Oral     SpO2 09/23/15 0948 98 %     Weight 09/23/15 0948 210 lb (95.3 kg)     Height 09/23/15 0948 5\' 10"  (1.778 m)     Head Circumference --      Peak Flow --      Pain Score 09/23/15 0949 0     Pain Loc --      Pain Edu? --      Excl. in GC? --     Constitutional: Alert and oriented. Well appearing and in no acute distress. Eyes: Conjunctivae are normal. PERRL. EOMI. Head: Atraumatic. Nose: No congestion/rhinnorhea. Mouth/Throat: Mucous membranes are moist.  Oropharynx non-erythematous. Neck: No stridor.  No cervical spine tenderness to palpation. Hematological/Lymphatic/Immunilogical: No cervical lymphadenopathy. Cardiovascular: Normal rate, regular rhythm. Grossly normal heart sounds.   Good peripheral circulation. Respiratory: Normal respiratory effort.  No retractions. Lungs CTAB. Gastrointestinal: Soft and nontender. No distention. No abdominal bruits. No CVA tenderness. Musculoskeletal: No lower extremity tenderness nor edema.  No joint effusions. Neurologic:  Normal speech and language. No gross focal neurologic deficits are appreciated. No gait instability. Skin:  Skin is warm, dry and intact. No rash noted. Psychiatric: Mood and affect are normal. Speech and behavior are normal.  ____________________________________________   LABS (all labs ordered are listed, but only abnormal results are displayed)  Labs Reviewed - No data to display ____________________________________________  EKG   ____________________________________________  RADIOLOGY   ____________________________________________   PROCEDURES  Procedure(s) performed: None  Procedures  Critical Care performed: No  ____________________________________________   INITIAL IMPRESSION / ASSESSMENT AND PLAN / ED COURSE  Pertinent labs & imaging results that were available during my care of the patient were reviewed by me and considered in my medical decision making (see  chart for details).  Dressing change in the diabetic foot ulcer right lower extremity. Advised patient to follow-up scheduled with Dr. Rubye Oaks today.  Clinical Course     ____________________________________________   FINAL CLINICAL IMPRESSION(S) / ED DIAGNOSES  Final diagnoses:  Foot ulcer due to secondary DM (HCC)      NEW MEDICATIONS STARTED DURING THIS VISIT:  New Prescriptions   No medications on file     Note:  This document was prepared using Dragon voice recognition software and may include unintentional dictation errors.    Joni Reining, PA-C 09/23/15 1101    Nita Sickle, MD 09/23/15 [phone]

## 2015-09-24 NOTE — Telephone Encounter (Signed)
Pt told this yesterday.  He had an appt with them.

## 2015-10-05 NOTE — Assessment & Plan Note (Signed)
Continue to see podiatrist for ulceration; see AVS for instructions on medicines

## 2015-10-07 ENCOUNTER — Encounter: Payer: Medicare PPO | Admitting: Internal Medicine

## 2015-10-07 ENCOUNTER — Encounter: Payer: Self-pay | Admitting: *Deleted

## 2015-10-25 ENCOUNTER — Emergency Department: Payer: Medicare PPO

## 2015-10-25 ENCOUNTER — Encounter: Payer: Self-pay | Admitting: Emergency Medicine

## 2015-10-25 DIAGNOSIS — Z87891 Personal history of nicotine dependence: Secondary | ICD-10-CM | POA: Insufficient documentation

## 2015-10-25 DIAGNOSIS — J449 Chronic obstructive pulmonary disease, unspecified: Secondary | ICD-10-CM | POA: Diagnosis not present

## 2015-10-25 DIAGNOSIS — E119 Type 2 diabetes mellitus without complications: Secondary | ICD-10-CM | POA: Insufficient documentation

## 2015-10-25 DIAGNOSIS — Z7982 Long term (current) use of aspirin: Secondary | ICD-10-CM | POA: Insufficient documentation

## 2015-10-25 DIAGNOSIS — Z950 Presence of cardiac pacemaker: Secondary | ICD-10-CM | POA: Diagnosis not present

## 2015-10-25 DIAGNOSIS — I2510 Atherosclerotic heart disease of native coronary artery without angina pectoris: Secondary | ICD-10-CM | POA: Insufficient documentation

## 2015-10-25 DIAGNOSIS — R600 Localized edema: Secondary | ICD-10-CM | POA: Diagnosis not present

## 2015-10-25 DIAGNOSIS — Z79899 Other long term (current) drug therapy: Secondary | ICD-10-CM | POA: Insufficient documentation

## 2015-10-25 DIAGNOSIS — I10 Essential (primary) hypertension: Secondary | ICD-10-CM | POA: Diagnosis not present

## 2015-10-25 DIAGNOSIS — R2242 Localized swelling, mass and lump, left lower limb: Secondary | ICD-10-CM | POA: Diagnosis present

## 2015-10-25 LAB — BASIC METABOLIC PANEL
ANION GAP: 7 (ref 5–15)
BUN: 15 mg/dL (ref 6–20)
CHLORIDE: 110 mmol/L (ref 101–111)
CO2: 21 mmol/L — ABNORMAL LOW (ref 22–32)
Calcium: 9.7 mg/dL (ref 8.9–10.3)
Creatinine, Ser: 1.24 mg/dL (ref 0.61–1.24)
GFR calc Af Amer: >60 mL/min (ref >60)
GFR, EST NON AFRICAN AMERICAN: 54 mL/min — AB (ref >60)
GLUCOSE: 103 mg/dL — AB (ref 65–99)
POTASSIUM: 3.4 mmol/L — AB (ref 3.5–5.1)
Sodium: 138 mmol/L (ref 135–145)

## 2015-10-25 NOTE — ED Triage Notes (Signed)
Patient reports right leg swelling that started 2 days ago and left leg selling that started today. Patient with 2+ swelling to bilateral legs. Patient reports that he does have a history of DVT's. Patient unsure if he is taking any anticoagulants. Patient with weak bilateral pedal pulses.

## 2015-10-25 NOTE — ED Notes (Addendum)
FIRST NURSE NOTE: Patient presents with pain, swelling, and redness to his, with the LLE being worse that the right. Of note, patient treated here in the recent past for a diabetic foot ulcer and was referred out to podiatry. Presents today with c/o LLE tingling. Swelling to legs worsening over the last 2 days. PMH signifcant for VTE.

## 2015-10-26 ENCOUNTER — Emergency Department
Admission: EM | Admit: 2015-10-26 | Discharge: 2015-10-26 | Disposition: A | Discharge status: Home / Self Care | Payer: Medicare PPO | Attending: Emergency Medicine | Admitting: Emergency Medicine

## 2015-10-26 DIAGNOSIS — Z760 Encounter for issue of repeat prescription: Secondary | ICD-10-CM

## 2015-10-26 DIAGNOSIS — R609 Edema, unspecified: Secondary | ICD-10-CM

## 2015-10-26 MED ORDER — GABAPENTIN 300 MG PO CAPS: 300.0000 mg | ORAL_CAPSULE | Freq: Three times a day (TID) | ORAL | 0 refills | Status: DC

## 2015-10-26 MED ORDER — FUROSEMIDE 20 MG PO TABS: 20.0000 mg | ORAL_TABLET | Freq: Every day | ORAL | 0 refills | Status: DC

## 2015-10-26 MED ORDER — RAMIPRIL 5 MG PO CAPS
5.0000 mg | ORAL_CAPSULE | Freq: Every day | ORAL | 1 refills | Status: DC
Start: 2015-10-26 — End: 2015-12-04

## 2015-10-26 NOTE — ED Provider Notes (Signed)
[facility] Emergency Department Provider Note  ____________________________________________  Time seen: Approximately 2:27 AM  I have reviewed the triage vital signs and the nursing notes.   HISTORY  Chief Complaint Leg Swelling    HPI [name] is a 81 y.o. male complains of running out of his gabapentin and worsening peripheral neuropathy pain. He also feels like maybe his legs are more swollen although his right leg is always more swollen than the left leg and they have not significant change size. He is also concerned that he could be developing a cellulitis. No fevers chills or sweats. No chest pain or shortness of breath. No trauma. Reports he also takes Lasix which he has been compliant with and has not run out of. States that he ran out of some medications because he has trouble following up with his doctor his usually scheduled appointment because his car broke down.     Past Medical History:  Diagnosis Date  . Arthritis   . CAD (coronary artery disease)   . COPD (chronic obstructive pulmonary disease) (HCC)   . Diabetes mellitus without complication (HCC)   . GERD (gastroesophageal reflux disease)   . History of blood clots   . 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 Hyperlipidemia   . Hypertension   . Literacy level of illiterate   . Osteomyelitis (HCC)   . [facility]peripheral neuropathy[facility])      Patient Active Problem List   Diagnosis Date Noted  . 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 Abnormal serum protein electrophoresis 12/05/2014  . Pressure ulcer 11/15/2014  . Syncope and collapse 11/14/2014  . Hyperproteinemia 11/02/2014  . Hypoalbuminemia 11/02/2014  . Arthritis 10/23/2014  . Arteriosclerosis of coronary artery 10/23/2014  . Diabetic foot ulcer associated with type 2 diabetes  mellitus (HCC) 10/23/2014  . Amputation of finger of left hand 10/23/2014  . History of pulmonary embolism 10/23/2014  . Literacy problem 10/23/2014  . Umbilical hernia without obstruction or gangrene 10/23/2014  . Vitamin B12 deficiency 10/23/2014  . Neuropathy of lower extremity 10/23/2014  . History of osteomyelitis 10/23/2014  . Complication of diabetes mellitus (HCC) 03/27/2014  . Artificial cardiac pacemaker 08/10/2013  . H/O coronary artery bypass surgery 08/10/2013  . Benign prostatic hyperplasia with urinary obstruction 09/27/2012  . Abnormal prostate specific antigen 09/27/2012     Past Surgical History:  Procedure Laterality Date  . amputation 4th finger Left   . APPENDECTOMY    . CLAVICLE SURGERY  2012   open reduction and internal fixation of left clavicle  . CORONARY ARTERY BYPASS GRAFT  2002  . FOOT SURGERY    . PACEMAKER INSERTION       Prior to Admission medications   Medication Sig Start Date End Date Taking? Authorizing Provider  aspirin EC 81 MG EC tablet Take 1 tablet (81 mg total) by mouth daily. 08/12/15   Delfino LovettVipul Shah, MD  atorvastatin (LIPITOR) 40 MG tablet Take 1 tablet (40 mg total) by mouth at bedtime. 07/23/15   Kerman PasseyMelinda P [name], MD  furosemide (LASIX) 20 MG tablet Take 1 tablet (20 mg total) by mouth daily. 10/26/15   Sharman CheekPhillip Yannis Gumbs, MD  gabapentin (NEURONTIN) 300 MG capsule Take 1 capsule (300 mg total) by mouth 3 (three) times daily. 10/26/15   Sharman CheekPhillip Kurt Hoffmeier, MD  Omega-3 Fatty Acids (FISH OIL) 600 MG CAPS Take 300 mg by mouth 2 (  two) times daily.    Historical Provider, MD  omeprazole (PRILOSEC) 40 MG capsule Take 1 capsule (40 mg total) by mouth daily. 07/23/15   Kerman Passey, MD  OVER THE COUNTER MEDICATION Take 2 capsules by mouth daily. Pt takes OmegaXL.    Historical Provider, MD  ramipril (ALTACE) 5 MG capsule Take 1 capsule (5 mg total) by mouth daily. 10/26/15   Sharman Cheek, MD  vitamin B-12 (CYANOCOBALAMIN) 500 MCG tablet Take 500 mcg by  mouth daily.    Historical Provider, MD     Allergies Oxycodone-acetaminophen and Percocet [oxycodone-acetaminophen]   Family History  Problem Relation Age of Onset  . Cancer Mother     spine  . Hypertension Mother   . Mental illness Mother   . Cancer Father     lung  . Asthma Father   . Allergic rhinitis Father   . Arthritis Father     Social History Social History  Substance Use Topics  . Smoking status: Former Smoker    Packs/day: 0.25    Types: Cigarettes, Cigars    Quit date: 10/18/1979  . Smokeless tobacco: Never Used  . Alcohol use No    Review of Systems  Constitutional:   No fever or chills.  ENT:   No sore throat. No rhinorrhea. Cardiovascular:   No chest pain. Respiratory:   No dyspnea or cough.No orthopnea or PND Gastrointestinal:   Negative for abdominal pain, vomiting and diarrhea.  Genitourinary:   Normal urination. Musculoskeletal:   Bilateral leg pain Neurological:   Negative for headaches 10-point ROS otherwise negative.  ____________________________________________   PHYSICAL EXAM:  VITAL SIGNS: ED Triage Vitals  Enc Vitals Group     BP 10/25/15 1958 (!) 153/72     Pulse Rate 10/25/15 1958 71     Resp 10/25/15 1958 18     Temp 10/25/15 1958 98 F (36.7 C)     Temp Source 10/25/15 1958 Oral     SpO2 10/25/15 1958 98 %     Weight 10/25/15 1959 214 lb (97.1 kg)     Height 10/25/15 1959 5\' 10"  (1.778 m)     Head Circumference --      Peak Flow --      Pain Score 10/25/15 1959 8     Pain Loc --      Pain Edu? --      Excl. in GC? --     Vital signs reviewed, nursing assessments reviewed.   Constitutional:   Alert and oriented. Well appearing and in no distress. Eyes:   No scleral icterus. No conjunctival pallor. PERRL. EOMI.  No nystagmus. ENT   Head:   Normocephalic and atraumatic.   Nose:   No congestion/rhinnorhea. No septal hematoma   Mouth/Throat:   MMM, no pharyngeal erythema. No peritonsillar mass.    Neck:    No stridor. No SubQ emphysema. No meningismus.No JVD Hematological/Lymphatic/Immunilogical:   No cervical lymphadenopathy. Cardiovascular:   RRR. Symmetric bilateral radial and DP pulses.  No murmurs.  Respiratory:   Normal respiratory effort without tachypnea nor retractions. Breath sounds are clear and equal bilaterally. No wheezes/rales/rhonchi. Gastrointestinal:   Soft and nontender. Non distended. There is no CVA tenderness.  No rebound, rigidity, or guarding. Genitourinary:   deferred Musculoskeletal:   Nontender with normal range of motion in all extremities. No joint effusions.  No lower extremity tenderness.  Symmetric bilateral 1+ pitting edema. No calf tenderness. Negative Homans sign. No warmth erythema induration or streaking.. Neurologic:  Normal speech and language.  CN 2-10 normal. Motor grossly intact. No gross focal neurologic deficits are appreciated.  Skin:    Skin is warm, dry and intact. No rash noted.  No petechiae, purpura, or bullae.  ____________________________________________    LABS (pertinent positives/negatives) (all labs ordered are listed, but only abnormal results are displayed) Labs Reviewed  BASIC METABOLIC PANEL - Abnormal; Notable for the following:       Result Value   Potassium 3.4 (*)    CO2 21 (*)    Glucose, Bld 103 (*)    GFR calc non Af Amer 54 (*)    All other components within normal limits   ____________________________________________   EKG    ____________________________________________    RADIOLOGY Ultrasound bilateral lower extremities negative for DVT   ____________________________________________   PROCEDURES Procedures  ____________________________________________   INITIAL IMPRESSION / ASSESSMENT AND PLAN / ED COURSE  Pertinent labs & imaging results that were available during my care of the patient were reviewed by me and considered in my medical decision making (see chart for details).  Patient well  appearing no acute distress. No evidence of congestive heart failure or ACS. No evidence of soft tissue infection and osteomyelitis abscess cellulitis or necrotizing fasciitis. No evidence of DVT. Ultrasound negative. Creatinine within normal limits and stable. This appears to be simply an encounter for medication refill due to running out of his gabapentin. He states that he ran out of to medications but cannot remember the other one. Review of his medication list suggest that the most likely culprits her Lasix or ramipril. I will refill these as well to ensure that he can continue his medication regimen. Follow up with primary care.     Clinical Course   ____________________________________________   FINAL CLINICAL IMPRESSION(S) / ED DIAGNOSES  Final diagnoses:  Peripheral edema  Encounter for medication refill       Portions of this note were generated with dragon dictation software. Dictation errors may occur despite best attempts at proofreading.    Sharman Cheek, MD 10/26/15 0230

## 2015-10-26 NOTE — ED Notes (Signed)
Reviewed d/c instructions, follow-up care and prescriptions with pt. Pt verbalized understanding 

## 2015-11-07 ENCOUNTER — Other Ambulatory Visit: Payer: Self-pay

## 2015-11-07 MED ORDER — ATORVASTATIN CALCIUM 40 MG PO TABS: 40.0000 mg | ORAL_TABLET | Freq: Every day | ORAL | 0 refills | Status: DC

## 2015-11-07 NOTE — Telephone Encounter (Signed)
June labs reviewed; rx approved 

## 2015-11-07 NOTE — Telephone Encounter (Signed)
Pharmacy requesting 90 day

## 2015-11-25 ENCOUNTER — Ambulatory Visit
Admission: RE | Admit: 2015-11-25 | Discharge: 2015-11-25 | Disposition: A | Discharge status: Home / Self Care | Payer: Medicare PPO | Source: Ambulatory Visit | Attending: Cardiology | Admitting: Cardiology

## 2015-11-25 ENCOUNTER — Encounter
Admission: RE | Admit: 2015-11-25 | Discharge: 2015-11-25 | Disposition: A | Discharge status: Home / Self Care | Payer: Medicare PPO | Source: Ambulatory Visit | Attending: Cardiology | Admitting: Cardiology

## 2015-11-25 ENCOUNTER — Ambulatory Visit: Admission: RE | Admit: 2015-11-25 | Payer: Medicare PPO | Source: Ambulatory Visit

## 2015-11-25 DIAGNOSIS — Z885 Allergy status to narcotic agent status: Secondary | ICD-10-CM | POA: Insufficient documentation

## 2015-11-25 DIAGNOSIS — Z01818 Encounter for other preprocedural examination: Secondary | ICD-10-CM | POA: Insufficient documentation

## 2015-11-25 DIAGNOSIS — Z95811 Presence of heart assist device: Secondary | ICD-10-CM | POA: Insufficient documentation

## 2015-11-25 DIAGNOSIS — I447 Left bundle-branch block, unspecified: Secondary | ICD-10-CM | POA: Diagnosis not present

## 2015-11-25 DIAGNOSIS — Z86711 Personal history of pulmonary embolism: Secondary | ICD-10-CM

## 2015-11-25 DIAGNOSIS — Z01812 Encounter for preprocedural laboratory examination: Secondary | ICD-10-CM | POA: Diagnosis not present

## 2015-11-25 DIAGNOSIS — I498 Other specified cardiac arrhythmias: Secondary | ICD-10-CM | POA: Insufficient documentation

## 2015-11-25 DIAGNOSIS — R9431 Abnormal electrocardiogram [ECG] [EKG]: Secondary | ICD-10-CM | POA: Insufficient documentation

## 2015-11-25 DIAGNOSIS — I255 Ischemic cardiomyopathy: Secondary | ICD-10-CM | POA: Insufficient documentation

## 2015-11-25 DIAGNOSIS — R001 Bradycardia, unspecified: Secondary | ICD-10-CM | POA: Insufficient documentation

## 2015-11-25 HISTORY — DX: Family history of other specified conditions: Z84.89

## 2015-11-25 LAB — BASIC METABOLIC PANEL
ANION GAP: 7 (ref 5–15)
BUN: 14 mg/dL (ref 6–20)
CHLORIDE: 107 mmol/L (ref 101–111)
CO2: 24 mmol/L (ref 22–32)
Calcium: 9.6 mg/dL (ref 8.9–10.3)
Creatinine, Ser: 1.35 mg/dL — ABNORMAL HIGH (ref 0.61–1.24)
GFR calc Af Amer: 56 mL/min — ABNORMAL LOW (ref >60)
GFR, EST NON AFRICAN AMERICAN: 48 mL/min — AB (ref >60)
GLUCOSE: 125 mg/dL — AB (ref 65–99)
POTASSIUM: 3.3 mmol/L — AB (ref 3.5–5.1)
SODIUM: 138 mmol/L (ref 135–145)

## 2015-11-25 LAB — CBC
HCT: 40.2 % (ref 40.0–52.0)
HEMOGLOBIN: 14 g/dL (ref 13.0–18.0)
MCH: 31.7 pg (ref 26.0–34.0)
MCHC: 34.9 g/dL (ref 32.0–36.0)
MCV: 90.9 fL (ref 80.0–100.0)
Platelets: 155 10*3/uL (ref 150–440)
RBC: 4.42 MIL/uL (ref 4.40–5.90)
RDW: 16.6 % — ABNORMAL HIGH (ref 11.5–14.5)
WBC: 10.5 10*3/uL (ref 3.8–10.6)

## 2015-11-25 LAB — SURGICAL PCR SCREEN
MRSA, PCR: NEGATIVE
STAPHYLOCOCCUS AUREUS: NEGATIVE

## 2015-11-25 LAB — PROTIME-INR
INR: 0.97
PROTHROMBIN TIME: 12.9 s (ref 11.4–15.2)

## 2015-11-25 LAB — APTT: APTT: 26 s (ref 24–36)

## 2015-11-25 NOTE — Patient Instructions (Signed)
  Your procedure is scheduled WU:[id number]:WEdnesday Oct. 18 , 2017. Report to Same Day Surgery. To find out your arrival time please call [phone] between 1PM - 3PM on Tuesday Oct. 17, 2017.  Remember: Instructions that are not followed completely may result in serious medical risk, up to and including death, or upon the discretion of your surgeon and anesthesiologist your surgery may need to be rescheduled.    _x___ 1. Do not eat food or drink liquids after midnight. No gum chewing or hard candies.     ____ 2. No Alcohol for 24 hours before or after surgery.   ____ 3. Bring all medications with you on the day of surgery if instructed.    __x__ 4. Notify your doctor if there is any change in your medical condition     (cold, fever, infections).    _____ 5. No smoking 24 hours prior to surgery.     Do not wear jewelry, make-up, hairpins, clips or nail polish.  Do not wear lotions, powders, or perfumes.   Do not shave 48 hours prior to surgery. Men may shave face and neck.  Do not bring valuables to the hospital.    [facility] is not [facility] for any belongings or valuables.               Contacts, dentures or bridgework may not be worn into surgery.  Leave your suitcase in the car. After surgery it may be brought to your room.  For patients admitted to the hospital, discharge time is determined by your treatment team.   Patients discharged the day of surgery will not be allowed to drive home.    Please read over the following fact sheets that you were given:   [facility] Preparing for [facility]  __x_ Take these medicines the morning of surgery with A SIP OF WATER:    1. ramipril (ALTACE)   2. omeprazole (PRILOSEC)   ____ Fleet Enema (as directed)   __x__ Use CHG Soap as directed on instruction sheet  ____ Use inhalers on the day of surgery and bring to hospital day of surgery  __x__ Stop metformin 2 days prior to surgery, stop on December 01, 2015.    ____ Take 1/2 of usual  insulin dose the night before surgery and none on the morning of surgery.   ____ Stop Coumadin/Plavix/aspirin on does not apply.  __x_ Stop Anti-inflammatories such as Advil, Aleve, Ibuprofen, Motrin, Naproxen, Naprosyn, Goodies powders or aspirin products. OK to take Tylenol.   __x__ Stop supplements Omega-3 Fatty Acids (FISH OIL) &OmegaXL  until after surgery.    ____ Bring C-Pap to the hospital.

## 2015-11-25 NOTE — Pre-Procedure Instructions (Signed)
Spoke with Elease HashimotoPatricia at Dr. Darrold JunkerParaschos' office notifying them the pt's H&P will be greater than 5930 days old on day of surgery and will need to be redone.

## 2015-11-28 NOTE — Pre-Procedure Instructions (Signed)
Called Dr. Darrold JunkerParaschos' office, [facility], notified her that the H+P done on 10/30/15 will be out of date on day of surger.  He can either have pt come in for a new H&P or do a new H&P the day of surgery, his choice.

## 2015-12-01 ENCOUNTER — Inpatient Hospital Stay
Admission: EM | Admit: 2015-12-01 | Discharge: 2015-12-04 | DRG: 245 | Disposition: A | Discharge status: Home Health | Payer: Medicare PPO | Attending: Internal Medicine | Admitting: Internal Medicine

## 2015-12-01 ENCOUNTER — Emergency Department: Payer: Medicare PPO

## 2015-12-01 ENCOUNTER — Encounter: Payer: Self-pay | Admitting: Emergency Medicine

## 2015-12-01 DIAGNOSIS — E0590 Thyrotoxicosis, unspecified without thyrotoxic crisis or storm: Secondary | ICD-10-CM | POA: Diagnosis present

## 2015-12-01 DIAGNOSIS — H5500 Unspecified nystagmus: Secondary | ICD-10-CM | POA: Diagnosis present

## 2015-12-01 DIAGNOSIS — K219 Gastro-esophageal reflux disease without esophagitis: Secondary | ICD-10-CM | POA: Diagnosis present

## 2015-12-01 DIAGNOSIS — R42 Dizziness and giddiness: Secondary | ICD-10-CM | POA: Diagnosis present

## 2015-12-01 DIAGNOSIS — E11621 Type 2 diabetes mellitus with foot ulcer: Secondary | ICD-10-CM | POA: Diagnosis present

## 2015-12-01 DIAGNOSIS — Z8261 Family history of arthritis: Secondary | ICD-10-CM

## 2015-12-01 DIAGNOSIS — J449 Chronic obstructive pulmonary disease, unspecified: Secondary | ICD-10-CM | POA: Diagnosis present

## 2015-12-01 DIAGNOSIS — E785 Hyperlipidemia, unspecified: Secondary | ICD-10-CM | POA: Diagnosis present

## 2015-12-01 DIAGNOSIS — I2510 Atherosclerotic heart disease of native coronary artery without angina pectoris: Secondary | ICD-10-CM | POA: Diagnosis present

## 2015-12-01 DIAGNOSIS — Z951 Presence of aortocoronary bypass graft: Secondary | ICD-10-CM

## 2015-12-01 DIAGNOSIS — L97511 Non-pressure chronic ulcer of other part of right foot limited to breakdown of skin: Secondary | ICD-10-CM | POA: Diagnosis present

## 2015-12-01 DIAGNOSIS — I10 Essential (primary) hypertension: Secondary | ICD-10-CM | POA: Diagnosis present

## 2015-12-01 DIAGNOSIS — Z825 Family history of asthma and other chronic lower respiratory diseases: Secondary | ICD-10-CM

## 2015-12-01 DIAGNOSIS — R260 Ataxic gait: Secondary | ICD-10-CM | POA: Diagnosis present

## 2015-12-01 DIAGNOSIS — R55 Syncope and collapse: Principal | ICD-10-CM | POA: Diagnosis present

## 2015-12-01 DIAGNOSIS — A0472 Enterocolitis due to Clostridium difficile, not specified as recurrent: Secondary | ICD-10-CM

## 2015-12-01 DIAGNOSIS — I447 Left bundle-branch block, unspecified: Secondary | ICD-10-CM | POA: Diagnosis present

## 2015-12-01 DIAGNOSIS — Z7984 Long term (current) use of oral hypoglycemic drugs: Secondary | ICD-10-CM | POA: Diagnosis not present

## 2015-12-01 DIAGNOSIS — Z8249 Family history of ischemic heart disease and other diseases of the circulatory system: Secondary | ICD-10-CM

## 2015-12-01 DIAGNOSIS — Z87820 Personal history of traumatic brain injury: Secondary | ICD-10-CM | POA: Diagnosis not present

## 2015-12-01 DIAGNOSIS — R262 Difficulty in walking, not elsewhere classified: Secondary | ICD-10-CM

## 2015-12-01 DIAGNOSIS — E1140 Type 2 diabetes mellitus with diabetic neuropathy, unspecified: Secondary | ICD-10-CM | POA: Diagnosis present

## 2015-12-01 DIAGNOSIS — Z87891 Personal history of nicotine dependence: Secondary | ICD-10-CM

## 2015-12-01 DIAGNOSIS — R531 Weakness: Secondary | ICD-10-CM

## 2015-12-01 DIAGNOSIS — R2681 Unsteadiness on feet: Secondary | ICD-10-CM

## 2015-12-01 DIAGNOSIS — Z4502 Encounter for adjustment and management of automatic implantable cardiac defibrillator: Secondary | ICD-10-CM

## 2015-12-01 LAB — COMPREHENSIVE METABOLIC PANEL
ALT: 14 U/L — AB (ref 17–63)
AST: 22 U/L (ref 15–41)
Albumin: 3.6 g/dL (ref 3.5–5.0)
Alkaline Phosphatase: 86 U/L (ref 38–126)
Anion gap: 7 (ref 5–15)
BUN: 12 mg/dL (ref 6–20)
CHLORIDE: 109 mmol/L (ref 101–111)
CO2: 24 mmol/L (ref 22–32)
CREATININE: 1.25 mg/dL — AB (ref 0.61–1.24)
Calcium: 9.6 mg/dL (ref 8.9–10.3)
GFR calc Af Amer: >60 mL/min (ref >60)
GFR calc non Af Amer: 53 mL/min — ABNORMAL LOW (ref >60)
Glucose, Bld: 95 mg/dL (ref 65–99)
Potassium: 3.5 mmol/L (ref 3.5–5.1)
SODIUM: 140 mmol/L (ref 135–145)
Total Bilirubin: 0.8 mg/dL (ref 0.3–1.2)
Total Protein: 8.1 g/dL (ref 6.5–8.1)

## 2015-12-01 LAB — CBC WITH DIFFERENTIAL/PLATELET
BASOS ABS: 0.1 10*3/uL (ref 0–0.1)
BASOS PCT: 1 %
EOS ABS: 0.4 10*3/uL (ref 0–0.7)
EOS PCT: 4 %
HCT: 41 % (ref 40.0–52.0)
Hemoglobin: 14.3 g/dL (ref 13.0–18.0)
Lymphocytes Relative: 16 %
Lymphs Abs: 1.3 10*3/uL (ref 1.0–3.6)
MCH: 31.5 pg (ref 26.0–34.0)
MCHC: 35 g/dL (ref 32.0–36.0)
MCV: 90.1 fL (ref 80.0–100.0)
Monocytes Absolute: 1.1 10*3/uL — ABNORMAL HIGH (ref 0.2–1.0)
Monocytes Relative: 13 %
Neutro Abs: 5.6 10*3/uL (ref 1.4–6.5)
Neutrophils Relative %: 66 %
PLATELETS: 167 10*3/uL (ref 150–440)
RBC: 4.56 MIL/uL (ref 4.40–5.90)
RDW: 16 % — ABNORMAL HIGH (ref 11.5–14.5)
WBC: 8.5 10*3/uL (ref 3.8–10.6)

## 2015-12-01 LAB — URINALYSIS COMPLETE WITH MICROSCOPIC (ARMC ONLY)
Bacteria, UA: NONE SEEN
Bilirubin Urine: NEGATIVE
Glucose, UA: NEGATIVE mg/dL
Hgb urine dipstick: NEGATIVE
Ketones, ur: NEGATIVE mg/dL
LEUKOCYTES UA: NEGATIVE
Nitrite: NEGATIVE
PH: 5 (ref 5.0–8.0)
PROTEIN: NEGATIVE mg/dL
SPECIFIC GRAVITY, URINE: 1.008 (ref 1.005–1.030)
SQUAMOUS EPITHELIAL / LPF: NONE SEEN

## 2015-12-01 LAB — TROPONIN I: Troponin I: <0.03 ng/mL (ref <0.03)

## 2015-12-01 LAB — GLUCOSE, CAPILLARY: Glucose-Capillary: 158 mg/dL — ABNORMAL HIGH (ref 65–99)

## 2015-12-01 MED ORDER — SENNOSIDES-DOCUSATE SODIUM 8.6-50 MG PO TABS: 1.0000 | ORAL_TABLET | Freq: Every evening | ORAL | Status: DC | PRN

## 2015-12-01 MED ORDER — SODIUM CHLORIDE 0.9% FLUSH
3.0000 mL | Freq: Two times a day (BID) | INTRAVENOUS | Status: DC
Start: 2015-12-01 — End: 2015-12-04
  Administered 2015-12-01 – 2015-12-04 (×5): 3 mL via INTRAVENOUS

## 2015-12-01 MED ORDER — ENOXAPARIN SODIUM 40 MG/0.4ML ~~LOC~~ SOLN
40.0000 mg | SUBCUTANEOUS | Status: DC
  Administered 2015-12-03: 40 mg via SUBCUTANEOUS
  Filled 2015-12-01 (×2): qty 0.4

## 2015-12-01 MED ORDER — VITAMIN B-12 1000 MCG PO TABS
500.0000 ug | ORAL_TABLET | Freq: Every day | ORAL | Status: DC
  Administered 2015-12-02 – 2015-12-04 (×3): 500 ug via ORAL
  Filled 2015-12-01 (×3): qty 1

## 2015-12-01 MED ORDER — INSULIN ASPART 100 UNIT/ML ~~LOC~~ SOLN
0.0000 [IU] | Freq: Three times a day (TID) | SUBCUTANEOUS | Status: DC
  Administered 2015-12-02: 2 [IU] via SUBCUTANEOUS
  Filled 2015-12-01: qty 2

## 2015-12-01 MED ORDER — MECLIZINE HCL 25 MG PO TABS
12.5000 mg | ORAL_TABLET | ORAL | Status: AC
  Administered 2015-12-01: 12.5 mg via ORAL
  Filled 2015-12-01: qty 1

## 2015-12-01 MED ORDER — LOPERAMIDE HCL 2 MG PO CAPS
4.0000 mg | ORAL_CAPSULE | Freq: Once | ORAL | Status: AC
  Administered 2015-12-01: 4 mg via ORAL
  Filled 2015-12-01: qty 2

## 2015-12-01 MED ORDER — ASPIRIN 81 MG PO CHEW
324.0000 mg | CHEWABLE_TABLET | Freq: Once | ORAL | Status: AC
  Administered 2015-12-01: 324 mg via ORAL
  Filled 2015-12-01: qty 4

## 2015-12-01 MED ORDER — GABAPENTIN 300 MG PO CAPS
300.0000 mg | ORAL_CAPSULE | Freq: Three times a day (TID) | ORAL | Status: DC
  Administered 2015-12-01 – 2015-12-04 (×9): 300 mg via ORAL
  Filled 2015-12-01 (×9): qty 1

## 2015-12-01 MED ORDER — MAGNESIUM CITRATE PO SOLN
1.0000 | Freq: Once | ORAL | Status: DC | PRN
  Filled 2015-12-01: qty 296

## 2015-12-01 MED ORDER — HALOPERIDOL 0.5 MG PO TABS
0.5000 mg | ORAL_TABLET | Freq: Two times a day (BID) | ORAL | Status: DC
  Administered 2015-12-01 – 2015-12-04 (×6): 0.5 mg via ORAL
  Filled 2015-12-01 (×7): qty 1

## 2015-12-01 MED ORDER — INSULIN ASPART 100 UNIT/ML ~~LOC~~ SOLN
0.0000 [IU] | Freq: Every day | SUBCUTANEOUS | Status: DC
Start: 2015-12-01 — End: 2015-12-04

## 2015-12-01 MED ORDER — ONDANSETRON HCL 4 MG PO TABS: 4.0000 mg | ORAL_TABLET | Freq: Four times a day (QID) | ORAL | Status: DC | PRN

## 2015-12-01 MED ORDER — RAMIPRIL 5 MG PO CAPS
5.0000 mg | ORAL_CAPSULE | Freq: Every day | ORAL | Status: DC
  Administered 2015-12-02 – 2015-12-04 (×3): 5 mg via ORAL
  Filled 2015-12-01 (×4): qty 1

## 2015-12-01 MED ORDER — SODIUM CHLORIDE 0.9 % IV SOLN
INTRAVENOUS | Status: DC
  Administered 2015-12-01 – 2015-12-03 (×3): via INTRAVENOUS

## 2015-12-01 MED ORDER — OMEGA-3-ACID ETHYL ESTERS 1 G PO CAPS
1.0000 g | ORAL_CAPSULE | Freq: Two times a day (BID) | ORAL | Status: DC
  Administered 2015-12-02 – 2015-12-04 (×4): 1 g via ORAL
  Filled 2015-12-01 (×4): qty 1

## 2015-12-01 MED ORDER — FUROSEMIDE 20 MG PO TABS
20.0000 mg | ORAL_TABLET | Freq: Every day | ORAL | Status: DC
  Administered 2015-12-02 – 2015-12-04 (×3): 20 mg via ORAL
  Filled 2015-12-01 (×3): qty 1

## 2015-12-01 MED ORDER — FISH OIL 600 MG PO CAPS: 300.0000 mg | ORAL_CAPSULE | Freq: Two times a day (BID) | ORAL | Status: DC

## 2015-12-01 MED ORDER — BISACODYL 5 MG PO TBEC: 5.0000 mg | DELAYED_RELEASE_TABLET | Freq: Every day | ORAL | Status: DC | PRN

## 2015-12-01 MED ORDER — ASPIRIN EC 81 MG PO TBEC
81.0000 mg | DELAYED_RELEASE_TABLET | Freq: Every day | ORAL | Status: DC
  Administered 2015-12-02 – 2015-12-04 (×3): 81 mg via ORAL
  Filled 2015-12-01 (×3): qty 1

## 2015-12-01 MED ORDER — DIAZEPAM 2 MG PO TABS
1.0000 mg | ORAL_TABLET | ORAL | Status: AC
  Administered 2015-12-01: 1 mg via ORAL
  Filled 2015-12-01: qty 1

## 2015-12-01 MED ORDER — ONDANSETRON HCL 4 MG/2ML IJ SOLN: 4.0000 mg | Freq: Four times a day (QID) | INTRAMUSCULAR | Status: DC | PRN

## 2015-12-01 MED ORDER — PANTOPRAZOLE SODIUM 40 MG PO TBEC
40.0000 mg | DELAYED_RELEASE_TABLET | Freq: Every day | ORAL | Status: DC
  Administered 2015-12-02 – 2015-12-04 (×3): 40 mg via ORAL
  Filled 2015-12-01 (×3): qty 1

## 2015-12-01 MED ORDER — SODIUM CHLORIDE 0.9 % IV SOLN
1000.0000 mL | Freq: Once | INTRAVENOUS | Status: AC
  Administered 2015-12-01: 1000 mL via INTRAVENOUS

## 2015-12-01 MED ORDER — ATORVASTATIN CALCIUM 20 MG PO TABS
40.0000 mg | ORAL_TABLET | Freq: Every day | ORAL | Status: DC
  Administered 2015-12-01 – 2015-12-03 (×3): 40 mg via ORAL
  Filled 2015-12-01 (×3): qty 2

## 2015-12-01 NOTE — ED Notes (Signed)
Took patient sandwich tray per dr. Mayford Knifewilliams

## 2015-12-01 NOTE — ED Triage Notes (Signed)
Pt in via EMS with complaints of dizziness over the last few days, pt states "something feels funny in my head" but is unable to explain.  Pt with schedule pace maker replacement in 2 days which is 6 months past due.  Pt called EMS today because he almost fell due to the dizziness, pt states, "everything went black."  Pt A/Ox4, vitals WDL, no immediate distress at this time.

## 2015-12-01 NOTE — ED Provider Notes (Signed)
[facility] Emergency Department Provider Note        Time seen: ----------------------------------------- 1:48 PM on 12/01/2015 -----------------------------------------    I have reviewed the triage vital signs and the nursing notes.   HISTORY  Chief Complaint No chief complaint on file.    HPI [name] is a 81 y.o. male who presents the ER for dizziness and being off balance. Patient states it's worse when he goes to a standing position. He reports decreased oral intake for several days, reports decreased appetite. He also had multiple episodes of diarrhea today. He denies vomiting, fever or other complaints. He also states he is due for a pacemaker battery change.   Past Medical History:  Diagnosis Date  . Arthritis   . CAD (coronary artery disease)   . COPD (chronic obstructive pulmonary disease) (HCC)   . Diabetes mellitus without complication (HCC)   . Family history of adverse reaction to anesthesia    mom seizures   . GERD (gastroesophageal reflux disease)   . History of blood clots in legs   . History of ischemic cardiomyopathy   . 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 Hyperlipidemia   . Hypertension   . Literacy level of illiterate   . Osteomyelitis (HCC)   . PN (peripheral neuropathy) [facility](HCC)     Patient Active Problem List   Diagnosis Date Noted  . 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 Abnormal serum protein electrophoresis 12/05/2014  . Pressure ulcer 11/15/2014  . Syncope and collapse 11/14/2014  . Hyperproteinemia 11/02/2014  . Hypoalbuminemia 11/02/2014  . Arthritis 10/23/2014  . Arteriosclerosis of coronary artery 10/23/2014  . Diabetic foot ulcer associated with type 2 diabetes mellitus (HCC) 10/23/2014  . Amputation of finger of left hand 10/23/2014  . History of  pulmonary embolism 10/23/2014  . Literacy problem 10/23/2014  . Umbilical hernia without obstruction or gangrene 10/23/2014  . Vitamin B12 deficiency 10/23/2014  . Neuropathy of lower extremity 10/23/2014  . History of osteomyelitis 10/23/2014  . Complication of diabetes mellitus (HCC) 03/27/2014  . Artificial cardiac pacemaker 08/10/2013  . H/O coronary artery bypass surgery 08/10/2013  . Benign prostatic hyperplasia with urinary obstruction 09/27/2012  . Abnormal prostate specific antigen 09/27/2012    Past Surgical History:  Procedure Laterality Date  . amputation 4th finger Left   . APPENDECTOMY    . CLAVICLE SURGERY  2012   open reduction and internal fixation of left clavicle  . CORONARY ARTERY BYPASS GRAFT  2002  . FOOT SURGERY    . PACEMAKER INSERTION      Allergies Oxycodone-acetaminophen and Percocet [oxycodone-acetaminophen]  Social History Social History  Substance Use Topics  . Smoking status: Former Smoker    Packs/day: 0.25    Types: Cigarettes, Cigars    Quit date: 10/18/1979  . Smokeless tobacco: Never Used  . Alcohol use No    Review of Systems Constitutional: Negative for fever. Cardiovascular: Negative for chest pain. Respiratory: Negative for shortness of breath. Gastrointestinal: Negative for abdominal pain, Positive for diarrhea Genitourinary: Negative for dysuria. Musculoskeletal: Negative for back pain. Skin: Negative for rash. Neurological: Negative for headaches,Positive for weakness, dizziness  10-point ROS otherwise negative.  ____________________________________________   PHYSICAL EXAM:  VITAL SIGNS: ED Triage Vitals  Enc Vitals Group     BP      Pulse      Resp  Temp      Temp src      SpO2      Weight      Height      Head Circumference      Peak Flow      Pain Score      Pain Loc      Pain Edu?      Excl. in GC?    Constitutional: Alert and oriented. Well appearing and in no distress. Eyes: Conjunctivae are  normal. PERRL. Normal extraocular movements. ENT   Head: Normocephalic and atraumatic.   Nose: No congestion/rhinnorhea.   Mouth/Throat: Mucous membranes are moist.   Neck: No stridor. Cardiovascular: Normal rate, regular rhythm. No murmurs, rubs, or gallops. Respiratory: Normal respiratory effort without tachypnea nor retractions. Breath sounds are clear and equal bilaterally. No wheezes/rales/rhonchi. Gastrointestinal: Soft and nontender. Normal bowel sounds Musculoskeletal: Nontender with normal range of motion in all extremities. No lower extremity tenderness nor edema. Neurologic:  Normal speech and language. No gross focal neurologic deficits are appreciated.  Skin:  Skin is warm, dry and intact. No rash noted. Psychiatric: Mood and affect are normal. Speech and behavior are normal.  ____________________________________________  EKG: Interpreted by me. Sinus rhythm rate of 65 bpm, PR interval, wide QRS, borderline long QT, left bundle branch block  ____________________________________________  ED COURSE:  Pertinent labs & imaging results that were available during my care of the patient were reviewed by me and considered in my medical decision making (see chart for details). Clinical Course  Patient presents to the ER for dizziness and lightheadedness, likely dehydration related. We will assess with basic labs, give IV fluid.  Procedures ____________________________________________   LABS (pertinent positives/negatives)  Labs Reviewed  CBC WITH DIFFERENTIAL/PLATELET - Abnormal; Notable for the following:       Result Value   RDW 16.0 (*)    Monocytes Absolute 1.1 (*)    All other components within normal limits  COMPREHENSIVE METABOLIC PANEL - Abnormal; Notable for the following:    Creatinine, Ser 1.25 (*)    ALT 14 (*)    GFR calc non Af Amer 53 (*)    All other components within normal limits  TROPONIN I  URINALYSIS COMPLETEWITH MICROSCOPIC ([facility] ONLY)    ____________________________________________  FINAL ASSESSMENT AND PLAN  Dizziness  Plan: Patient with labs as dictated above. Patient's in no acute distress this point, receiving IV fluids and a meal tray. Patient care checked out to Dr. Fanny Bien for final disposition    Mayford Knife Cecille Amsterdam, MD   Note: This dictation was prepared with Dragon dictation. Any transcriptional errors that result from this process are unintentional    Emily Filbert, MD 12/01/15 1520

## 2015-12-01 NOTE — ED Notes (Signed)
Patient transported to CT 
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
## 2015-12-01 NOTE — ED Provider Notes (Signed)
Received patient from Dr. Mayford KnifeWilliams  I went to reevaluate the patient, and we were not able to interpret his pacemaker per review of notes seems indicated his generator has depleted per Dr. Cassie FreerParachos, and appears the patient is being worked up with plans with a H&P to have his generator replaced as of a few days ago  The patient tells me when I walk in the room that he hasn't understand why lights seem to be going back and forth in the doorway. I then asked him he tells me that the whole room feels like it's shaking, having trouble walking feeling dizzy and as though his vision is trembling for the last 3-4 days.  On close examination his current nerves appear intact but he does seem to have non-extinguishing but yet mild nystagmus. Denies visual field deficit. He also has slightly dysarthric speech, but the patient reports a similar problem he's had for at least 2 years. States the trouble with his vision he noted a few days ago, he is fallen 4 times at home, and he feels like something is off and he can't walk for the last few days.  Review of the symptoms and consideration of lightheadedness, or a CT of the head and paged Dr. Thad Rangereynolds of neurology to obtain a consultation as I'm concerned about the possibility of a neurologic deficit or acute neurologic condition such as ischemic infarct or hemorrhagic stroke causing the patient's nystagmus along with dizziness and lightheadedness trouble walking as he reports for the last 4 days.  Dr. Roseanne RenoStewart of neurology call back. We discussed the patient's symptoms and nystagmus. Given the persistence of his symptoms, neurology recommends admission for MRI and evaluation to assure no ischemic event and to further workup for cause of nystagmus. Patient agreeable.  Visual be admitted for further workup of nystagmus and weakness. He is very clear that his symptoms started at least 2 or more days greater, this is not a candidate for acute intervention.   Sharyn CreamerMark Camay Pedigo,  MD 12/01/15 2040

## 2015-12-01 NOTE — ED Notes (Signed)
Unable to interrogate pacemaker at this time.  Pt is unable to tell us which pacemaker he has; attempted to interrogate with all 3 machines but none of them would read pacemaker.  MD aware.

## 2015-12-01 NOTE — ED Notes (Signed)
Upon checking on pt, pt with slurred speech, continues to point to head stating, "my head, my head, something is wrong with my head."  MD notified.

## 2015-12-02 DIAGNOSIS — R42 Dizziness and giddiness: Secondary | ICD-10-CM

## 2015-12-02 LAB — CBC
HCT: 39.2 % — ABNORMAL LOW (ref 40.0–52.0)
HEMATOCRIT: 37.6 % — AB (ref 40.0–52.0)
HEMOGLOBIN: 13.4 g/dL (ref 13.0–18.0)
Hemoglobin: 13 g/dL (ref 13.0–18.0)
MCH: 31.3 pg (ref 26.0–34.0)
MCH: 31.3 pg (ref 26.0–34.0)
MCHC: 34.2 g/dL (ref 32.0–36.0)
MCHC: 34.7 g/dL (ref 32.0–36.0)
MCV: 91.5 fL (ref 80.0–100.0)
MCV: 90.3 fL (ref 80.0–100.0)
PLATELETS: 153 10*3/uL (ref 150–440)
PLATELETS: 150 10*3/uL (ref 150–440)
RBC: 4.29 MIL/uL — AB (ref 4.40–5.90)
RBC: 4.16 MIL/uL — ABNORMAL LOW (ref 4.40–5.90)
RDW: 15.8 % — ABNORMAL HIGH (ref 11.5–14.5)
RDW: 15.9 % — ABNORMAL HIGH (ref 11.5–14.5)
WBC: 4.8 10*3/uL (ref 3.8–10.6)
WBC: 5 10*3/uL (ref 3.8–10.6)

## 2015-12-02 LAB — C DIFFICILE QUICK SCREEN W PCR REFLEX
C DIFFICILE (CDIFF) TOXIN: NEGATIVE
C DIFFICLE (CDIFF) ANTIGEN: POSITIVE — AB

## 2015-12-02 LAB — BASIC METABOLIC PANEL
ANION GAP: 5 (ref 5–15)
BUN: 10 mg/dL (ref 6–20)
CHLORIDE: 112 mmol/L — AB (ref 101–111)
CO2: 22 mmol/L (ref 22–32)
CREATININE: 1.08 mg/dL (ref 0.61–1.24)
Calcium: 8.7 mg/dL — ABNORMAL LOW (ref 8.9–10.3)
GFR calc non Af Amer: >60 mL/min (ref >60)
Glucose, Bld: 93 mg/dL (ref 65–99)
POTASSIUM: 3.5 mmol/L (ref 3.5–5.1)
SODIUM: 139 mmol/L (ref 135–145)

## 2015-12-02 LAB — GASTROINTESTINAL PANEL BY PCR, STOOL (REPLACES STOOL CULTURE)

## 2015-12-02 LAB — GLUCOSE, CAPILLARY
Glucose-Capillary: 90 mg/dL (ref 65–99)
Glucose-Capillary: 140 mg/dL — ABNORMAL HIGH (ref 65–99)
Glucose-Capillary: 91 mg/dL (ref 65–99)
Glucose-Capillary: 123 mg/dL — ABNORMAL HIGH (ref 65–99)

## 2015-12-02 LAB — TROPONIN I: Troponin I: <0.03 ng/mL (ref <0.03)

## 2015-12-02 LAB — TSH: TSH: 0.077 u[IU]/mL — AB (ref 0.350–4.500)

## 2015-12-02 LAB — CLOSTRIDIUM DIFFICILE BY PCR: Toxigenic C. Difficile by PCR: POSITIVE — AB

## 2015-12-02 LAB — MAGNESIUM: MAGNESIUM: 1.9 mg/dL (ref 1.7–2.4)

## 2015-12-02 LAB — PHOSPHORUS: PHOSPHORUS: 3.2 mg/dL (ref 2.5–4.6)

## 2015-12-02 MED ORDER — CHLORHEXIDINE GLUCONATE 4 % EX LIQD
60.0000 mL | Freq: Once | CUTANEOUS | Status: AC
  Administered 2015-12-03: 4 via TOPICAL

## 2015-12-02 MED ORDER — SODIUM CHLORIDE 0.9% FLUSH
3.0000 mL | Freq: Two times a day (BID) | INTRAVENOUS | Status: DC
  Administered 2015-12-03: 3 mL via INTRAVENOUS

## 2015-12-02 MED ORDER — CEFAZOLIN SODIUM-DEXTROSE 2-4 GM/100ML-% IV SOLN: 2.0000 g | INTRAVENOUS | Status: DC

## 2015-12-02 MED ORDER — SODIUM CHLORIDE 0.9 % IV SOLN
INTRAVENOUS | Status: DC
  Administered 2015-12-03: 12:00:00 via INTRAVENOUS

## 2015-12-02 MED ORDER — SODIUM CHLORIDE 0.9% FLUSH: 3.0000 mL | INTRAVENOUS | Status: DC | PRN

## 2015-12-02 MED ORDER — SODIUM CHLORIDE 0.9 % IV SOLN: INTRAVENOUS | Status: DC

## 2015-12-02 MED ORDER — SODIUM CHLORIDE 0.9 % IV SOLN
250.0000 mL | INTRAVENOUS | Status: DC
  Administered 2015-12-03: 250 mL via INTRAVENOUS

## 2015-12-02 MED ORDER — CEFAZOLIN IN D5W 1 GM/50ML IV SOLN
1.0000 g | Freq: Once | INTRAVENOUS | Status: DC
  Filled 2015-12-02: qty 50

## 2015-12-02 MED ORDER — POTASSIUM CHLORIDE CRYS ER 20 MEQ PO TBCR
40.0000 meq | EXTENDED_RELEASE_TABLET | Freq: Once | ORAL | Status: AC
  Administered 2015-12-02: 40 meq via ORAL
  Filled 2015-12-02: qty 2

## 2015-12-02 MED ORDER — CEFAZOLIN SODIUM-DEXTROSE 2-4 GM/100ML-% IV SOLN
2.0000 g | INTRAVENOUS | Status: AC
  Administered 2015-12-03: 2 g via INTRAVENOUS
  Filled 2015-12-02: qty 100

## 2015-12-02 MED ORDER — SODIUM CHLORIDE 0.9 % IR SOLN
80.0000 mg | Status: AC
  Filled 2015-12-02: qty 2

## 2015-12-02 NOTE — Progress Notes (Signed)
Patient ID: AZARIAN STARACE, male   DOB: 14-Jan-1935, 81 y.o.   MRN: [id number]  [facility] PROGRESS NOTE  RAMESH MOAN WUJ:[id number] DOB: Dec 09, 1934 DOA: 12/01/2015 PCP: Baruch Gouty, MD  HPI/Subjective: Patient had a few episodes of diarrhea prior to coming into the hospital, but none while in the hospital.  Patient is difficult to get a good history from.  Patient states he cant afford medication.  Objective: Vitals:   12/02/15 0521 12/02/15 0905  BP: 134/66 (!) 132/56  Pulse: (!) 57 63  Resp: 18   Temp: 97.7 F (36.5 C)     Filed Weights   12/01/15 1356 12/02/15 0000  Weight: 90.7 kg (200 lb) 86.9 kg (191 lb 8 oz)    ROS: Review of Systems  Constitutional: Negative for chills and fever.  Eyes: Negative for blurred vision.  Respiratory: Positive for shortness of breath. Negative for cough.   Cardiovascular: Negative for chest pain.  Gastrointestinal: Negative for abdominal pain, constipation, diarrhea, nausea and vomiting.  Genitourinary: Negative for dysuria.  Musculoskeletal: Negative for joint pain.  Neurological: Positive for dizziness. Negative for headaches.   Exam: Physical Exam  HENT:  Nose: No mucosal edema.  Mouth/Throat: No oropharyngeal exudate or posterior oropharyngeal edema.  Eyes: Conjunctivae, EOM and lids are normal. Pupils are equal, round, and reactive to light.  Neck: No JVD present. Carotid bruit is not present. No edema present. No thyroid mass and no thyromegaly present.  Cardiovascular: S1 normal and S2 normal.  Exam reveals no gallop.   Murmur heard.  Systolic murmur is present with a grade of 2/6  Pulses:      Dorsalis pedis pulses are 1+ on the right side, and 1+ on the left side.  Respiratory: No respiratory distress. He has no wheezes. He has no rhonchi. He has no rales.  GI: Soft. Bowel sounds are normal. There is no tenderness.  Musculoskeletal:       Right ankle: He exhibits no swelling.       Left ankle: He exhibits no  swelling.  Lymphadenopathy:    He has no cervical adenopathy.  Neurological: He is alert. No cranial nerve deficit.  Skin: Skin is warm. Nails show no clubbing.  Right second toe area of blackness on top of a callus. Under bilateral foot he does have small ulcerations with no signs of infection  Psychiatric: He has a normal mood and affect.      Data Reviewed: Basic Metabolic Panel:  Recent Labs Lab 12/01/15 1442 12/01/15 2314 12/02/15 0440  NA 140  --  139  K 3.5  --  3.5  CL 109  --  112*  CO2 24  --  22  GLUCOSE 95  --  93  BUN 12  --  10  CREATININE 1.25*  --  1.08  CALCIUM 9.6  --  8.7*  MG  --  1.9  --   PHOS  --  3.2  --    Liver Function Tests:  Recent Labs Lab 12/01/15 1442  AST 22  ALT 14*  ALKPHOS 86  BILITOT 0.8  PROT 8.1  ALBUMIN 3.6   CBC:  Recent Labs Lab 12/01/15 1442 12/02/15 0440 12/02/15 1040  WBC 8.5 4.8 5.0  NEUTROABS 5.6  --   --   HGB 14.3 13.4 13.0  HCT 41.0 39.2* 37.6*  MCV 90.1 91.5 90.3  PLT 167 153 150   Cardiac Enzymes:  Recent Labs Lab 12/01/15 1442 12/01/15 2314 12/02/15 0440 12/02/15 1040  TROPONINI <0.03 <0.03 <0.03 <0.03   CBG:  Recent Labs Lab 12/01/15 2333 12/02/15 0756 12/02/15 1200  GLUCAP 158* 90 140*    Recent Results (from the past 240 hour(s))  Surgical pcr screen     Status: None   Collection Time: 11/25/15 11:28 AM  Result Value Ref Range Status   MRSA, PCR NEGATIVE NEGATIVE Final   Staphylococcus aureus NEGATIVE NEGATIVE Final    Comment:        The Xpert SA Assay (FDA approved for NASAL specimens in patients over 1 years of age), is one component of a comprehensive surveillance program.  Test performance has been validated by [facility] for patients greater than or equal to 82 year old. It is not intended to diagnose infection nor to guide or monitor treatment.      Studies: Ct Head Wo Contrast  Result Date: 12/01/2015 CLINICAL DATA:  Patient with dizziness over the  last multiple days. EXAM: CT HEAD WITHOUT CONTRAST TECHNIQUE: Contiguous axial images were obtained from the base of the skull through the vertex without intravenous contrast. COMPARISON:  Brain CT 08/10/2015. FINDINGS: Brain: Ventricles and sulci are prominent compatible with atrophy. Periventricular and subcortical white matter hypodensity compatible with chronic microvascular ischemic changes. Chronic white matter ischemic changes near the right vertex. Old bilateral lacunar infarcts. No evidence for acute cortically based infarct, intracranial hemorrhage, mass lesion or mass-effect. Vascular: Internal carotid artery vascular calcifications. Skull: Intact. Sinuses/Orbits: Ethmoid air cells are unremarkable. Mucosal thickening involving the maxillary and frontal sinuses. Polypoid mucosal thickening right maxillary sinus. Other: None. IMPRESSION: No acute intracranial process. Chronic microvascular ischemic changes. Electronically Signed   By: Annia Belt M.D.   On: 12/01/2015 17:17    Scheduled Meds: . aspirin EC  81 mg Oral Daily  . atorvastatin  40 mg Oral QHS  .  ceFAZolin (ANCEF) IV  1 g Intravenous Once  .  ceFAZolin (ANCEF) IV  2 g Intravenous On Call  . chlorhexidine  60 mL Topical Once  . chlorhexidine  60 mL Topical Once  . enoxaparin (LOVENOX) injection  40 mg Subcutaneous Q24H  . furosemide  20 mg Oral Daily  . gabapentin  300 mg Oral TID  . gentamicin irrigation  80 mg Irrigation On Call  . haloperidol  0.5 mg Oral BID  . insulin aspart  0-15 Units Subcutaneous TID WC  . insulin aspart  0-5 Units Subcutaneous QHS  . omega-3 acid ethyl esters  1 g Oral BID  . pantoprazole  40 mg Oral Daily  . ramipril  5 mg Oral Daily  . sodium chloride flush  3 mL Intravenous Q12H  . sodium chloride flush  3 mL Intravenous Q12H  . vitamin B-12  500 mcg Oral Daily   Continuous Infusions: . sodium chloride 75 mL/hr at 12/01/15 2318  . sodium chloride    . sodium chloride    . sodium chloride       Assessment/Plan:  1. Weakness, dizziness and unsteady gait. Patient did have one vital sign that showed orthostatic hypotension. Patient states that he does not take any medications at home. Hold Lasix and ramipril. 2. Near syncope. Patient to have a pacemaker battery change tomorrow 3. Diabetic foot ulcer. No need for any intervention here. 4. Hyperlipidemia unspecified on Lipitor 5. History of diabetes. Hold metformin. On sliding scale neuropathy on Neurontin 6. Restarted Haldol   Code Status:     Code Status Orders        Start  Ordered   12/01/15 2253  Full code  Continuous     12/01/15 2252    Code Status History    Date Active Date Inactive Code Status Order ID Comments User Context   08/09/2015  6:36 PM 08/12/2015  6:56 PM Full Code [id number]  Arnaldo NatalMichael S Diamond, MD Inpatient   11/14/2014  3:58 PM 11/15/2014  9:35 PM Full Code [id number]  Auburn BilberryShreyang Patel, MD Inpatient     Disposition Plan: Potentially home tomorrow late afternoon versus Wednesday morning  Consultants:  Cardiology  Time spent: 25 minutes  Alford HighlandWIETING, Skylan Lara  [employer]

## 2015-12-02 NOTE — Care Management (Signed)
Met with patient to discuss medications. He has concerns that he cant always pay his copays. Patient has insurance. No resources available to assist with copays. He denies being in the donut hole.

## 2015-12-02 NOTE — Evaluation (Signed)
Physical Therapy Evaluation Patient Details Name: [name] MRN: [id number] DOB: 30-Jun-1934 Today's Date: 12/02/2015   History of Present Illness  Pt admitted for complaints of dizziness. Pt with history of CAD, COPD, osteomyelitis, GERD, TBI with resulting dizziness x multiple years. Pt also with pacemaker and is scheduled for battery replacement tomorrow. Pt with multiple falls at home secondary to dizziness.   Clinical Impression  Pt is a pleasant 81 year old male who was admitted for complaints of dizziness. Per patient, he was dehydrated and unable to eat. Pt reports feeling back to normal this date. Orthostatic BP obtained: supine 134/59; sitting 127/57; standing 112/62. No complaints of dizziness noted. Pt performs bed mobility/transfers with independence and ambulation with supervision and no AD. Pt able to navigate obstacles in room without difficulty. Pt demonstrates deficits with generalized balance/strength, although is very close to baseline. Pt with history of falls at home. Would benefit from skilled PT to address above deficits and promote optimal return to PLOF. Recommend transition to HHPT upon discharge from acute hospitalization.       Follow Up Recommendations Home health PT    Equipment Recommendations  None recommended by PT    Recommendations for Other Services       Precautions / Restrictions Precautions Precautions: Fall Restrictions Weight Bearing Restrictions: No      Mobility  Bed Mobility Overal bed mobility: Independent             General bed mobility comments: safe technique with transfer to EOB  Transfers Overall transfer level: Independent Equipment used: None             General transfer comment: Able to perform transfer multiple times with no LOB noted. Safe technique performed  Ambulation/Gait Ambulation/Gait assistance: Supervision Ambulation Distance (Feet): 40 Feet Assistive device: None Gait Pattern/deviations:  Step-to pattern     General Gait Details: Pt able to navigate obstacles in room and ambulate as pt on enteric isolation. Safe technique with all performance and no LOB noted. Pt reports he feels back to normal.  Stairs            Wheelchair Mobility    Modified Rankin (Stroke Patients Only)       Balance Overall balance assessment: History of Falls                                           Pertinent Vitals/Pain Pain Assessment: No/denies pain    Home Living Family/patient expects to be discharged to:: Private residence Living Arrangements:  (grandson) Available Help at Discharge: Family;Available 24 hours/day Type of Home: House Home Access: Level entry     Home Layout: One level Home Equipment: None      Prior Function Level of Independence: Independent         Comments: reports he was walking 7 miles/day     Hand Dominance        Extremity/Trunk Assessment   Upper Extremity Assessment: Overall WFL for tasks assessed           Lower Extremity Assessment: Overall WFL for tasks assessed         Communication   Communication: No difficulties  Cognition Arousal/Alertness: Awake/alert Behavior During Therapy: WFL for tasks assessed/performed Overall Cognitive Status: Within Functional Limits for tasks assessed  General Comments      Exercises Other Exercises Other Exercises: Seated ther-ex performed including B LE alt. marching, LAQ, and hip abd/add. All ther-ex performed x 10 reps with supervision and cues for correct technique. No fatigue with ther-ex   Assessment/Plan    PT Assessment Patient needs continued PT services  PT Problem List Decreased strength;Decreased activity tolerance;Decreased balance;Decreased mobility          PT Treatment Interventions Gait training;Therapeutic exercise    PT Goals (Current goals can be found in the Care Plan section)  Acute Rehab PT  Goals Patient Stated Goal: to go back home and cook PT Goal Formulation: With patient Time For Goal Achievement: 12/16/15 Potential to Achieve Goals: Good    Frequency Min 2X/week   Barriers to discharge        Co-evaluation               End of Session Equipment Utilized During Treatment: Gait belt Activity Tolerance: Patient tolerated treatment well Patient left: in chair;with chair alarm set Nurse Communication: Mobility status         Time: 1610-9604 PT Time Calculation (min) (ACUTE ONLY): 23 min   Charges:   PT Evaluation $PT Eval Low Complexity: 1 Procedure PT Treatments $Therapeutic Exercise: 8-22 mins   PT G Codes:        Zelia Yzaguirre December 28, 2015, 2:50 PM  Elizabeth Palau, PT, DPT [phone]

## 2015-12-02 NOTE — Consult Note (Signed)
[facility] CLINIC CARDIOLOGY A [facility]  CARDIOLOGY CONSULT NOTE  Patient ID: [name] MRN: [id number] DOB/AGE: April 25, 1934 81 y.o.  Admit date: 12/01/2015   HPI: Pt with history of cardiomyopathy with aicd which is at [employer]. Scheduled for generator change out tomorrow. Currently stable.Was scheduled as outpatient but admitted overnight for sob. Ruled out for mi. No arrhythmia  Review of Systems  Constitutional: Negative.   HENT: Negative.   Eyes: Negative.   Respiratory: Positive for shortness of breath.   Cardiovascular: Negative.   Gastrointestinal: Negative.   Genitourinary: Negative.   Musculoskeletal: Negative.   Skin: Negative.   Neurological: Negative.   Endo/Heme/Allergies: Negative.   Psychiatric/Behavioral: Negative.     Past Medical History:  Diagnosis Date  . Arthritis   . CAD (coronary artery disease)   . COPD (chronic obstructive pulmonary disease) (HCC)   . Diabetes mellitus without complication (HCC)   . Family history of adverse reaction to anesthesia    mom seizures   . GERD (gastroesophageal reflux disease)   . History of blood clots in legs   . History of ischemic cardiomyopathy   . 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 Hyperlipidemia   . Hypertension   . Literacy level of illiterate   . Osteomyelitis (HCC)   . PN (peripheral neuropathy) (HCC)     Family History  Problem Relation Age of Onset  . Cancer Mother     spine  . Hypertension Mother   . Mental illness Mother   . Cancer Father     lung  . Asthma Father   . Allergic rhinitis Father   . Arthritis Father     Social History   Social History  . Marital status: Divorced    Spouse name: N/A  . Number of children: N/A  . Years of education: N/A   Occupational History  . Not on file.   Social History Main Topics  . Smoking status: Former Smoker    Packs/day: 0.25    Types: Cigarettes, Cigars    Quit date: 10/18/1979  . Smokeless tobacco: Never Used  . Alcohol use  No  . Drug use: No  . Sexual activity: Not on file   Other Topics Concern  . Not on file   Social History Narrative  . No narrative on file    Past Surgical History:  Procedure Laterality Date  . amputation 4th finger Left   . APPENDECTOMY    . CLAVICLE SURGERY  2012   open reduction and internal fixation of left clavicle  . CORONARY ARTERY BYPASS GRAFT  2002  . FOOT SURGERY    . PACEMAKER INSERTION       Prescriptions Prior to Admission  Medication Sig Dispense Refill Last Dose  . atorvastatin (LIPITOR) 40 MG tablet Take 1 tablet (40 mg total) by mouth at bedtime. 90 tablet 0 11/30/2015 at Unknown time  . furosemide (LASIX) 20 MG tablet Take 1 tablet (20 mg total) by mouth daily. 30 tablet 0 12/01/2015 at Unknown time  . gabapentin (NEURONTIN) 300 MG capsule Take 1 capsule (300 mg total) by mouth 3 (three) times daily. 45 capsule 0 12/01/2015 at Unknown time  . haloperidol (HALDOL) 0.5 MG tablet Take 0.5 mg by mouth 2 (two) times daily.   12/01/2015 at Unknown time  . metFORMIN (GLUCOPHAGE) 500 MG tablet Take 500 mg by mouth daily with breakfast.   12/01/2015 at Unknown time  . Omega-3 Fatty Acids (FISH OIL) 600 MG CAPS Take 300 mg by mouth  2 (two) times daily.   12/01/2015 at Unknown time  . omeprazole (PRILOSEC) 40 MG capsule Take 1 capsule (40 mg total) by mouth daily. 30 capsule 1 12/01/2015 at Unknown time  . OVER THE COUNTER MEDICATION Take 2 capsules by mouth daily. Pt takes OmegaXL.   12/01/2015 at Unknown time  . ramipril (ALTACE) 5 MG capsule Take 1 capsule (5 mg total) by mouth daily. 14 capsule 1 12/01/2015 at Unknown time  . vitamin B-12 (CYANOCOBALAMIN) 500 MCG tablet Take 500 mcg by mouth daily.   12/01/2015 at Unknown time    Physical Exam: Blood pressure (!) 132/56, pulse 63, temperature 97.7 F (36.5 C), temperature source Oral, resp. rate 18, height 6' (1.829 m), weight 86.9 kg (191 lb 8 oz), SpO2 96 %.   Wt Readings from Last 1 Encounters:  12/02/15 86.9  kg (191 lb 8 oz)     General appearance: alert and cooperative Resp: clear to auscultation bilaterally Cardio: regular rate and rhythm GI: soft, non-tender; bowel sounds normal; no masses,  no organomegaly Neurologic: Grossly normal  Labs:   Lab Results  Component Value Date   WBC 4.8 12/02/2015   HGB 13.4 12/02/2015   HCT 39.2 (L) 12/02/2015   MCV 91.5 12/02/2015   PLT 153 12/02/2015    Recent Labs Lab 12/01/15 1442 12/02/15 0440  NA 140 139  K 3.5 3.5  CL 109 112*  CO2 24 22  BUN 12 10  CREATININE 1.25* 1.08  CALCIUM 9.6 8.7*  PROT 8.1  --   BILITOT 0.8  --   ALKPHOS 86  --   ALT 14*  --   AST 22  --   GLUCOSE 95 93   Lab Results  Component Value Date   TROPONINI <0.03 12/02/2015      Radiology: no pulmonary edema EKG: nsr  ASSESSMENT AND PLAN:  Continue with current regimen and proceed with generator change out tomorrow as planned.  Signed: Dalia HeadingFATH,Mattison Stuckey A. MD, [facility] CenterFACC 12/02/2015, 9:09 AM

## 2015-12-02 NOTE — Consult Note (Signed)
WOC Nurse wound consult note Reason for Consult:Diabetic Foot Ulcer Rt foot Wound type: Diabetic/ Pressure Chronic Pressure Ulcer POA: Yes Measurement:N/A    Wound WUJ:[id number]:Callused over  Drainage (amount, consistency, odor) N/A Periwound:Callused Dressing procedure/placement/frequency:Cleanse area to rt foot with soap and water, pat dry dry, apply betadine to callused area, leave open to air, apply daily. Will not follow at this time.  Please re-consult if needed.  Maple HudsonKaren Kessa Fairbairn RN BSN CWON Pager [phone]

## 2015-12-02 NOTE — Progress Notes (Signed)
Pt. A&Ox4, tele applied, verified with Liborio NixonJanice, CNA. Skin assessed with Hansel StarlingAdrienne, RN, skin warm and dry with diabetic ulcer to left plantar and left 2nd toe. Brown circumferential discoloration noted to BLE, from mid-calf down. General room orientation given Instruction on how to use ascom and call bell system given. Will continue to monitor pt.

## 2015-12-02 NOTE — Clinical Social Work Note (Signed)
MSW received consult that patient is having trouble getting medications, MSW does not assist with this, please consult case management.  MSW to sign off please reconsult if social work needs arise.  Kenneth KnackEric R. Lavilla Pittman, MSW [phone]  Mon-Fri 8a-4:30p 12/02/2015 8:53 AM

## 2015-12-02 NOTE — Consult Note (Signed)
Reason for Consult: Ulcerations on his feet Referring Physician: Hospitalist  [name] is an 81 y.o. male.  HPI: [name] is well known to me outpatient with a history of ulcerations on both feet in the past. He has had previous surgical intervention for a couple of these areas that had bone involvement. Recently admitted to the hospital with some dizziness and vertigo for further workup. Patient denies any recent drainage from the sores on his feet.  Past Medical History:  Diagnosis Date  . Arthritis   . CAD (coronary artery disease)   . COPD (chronic obstructive pulmonary disease) ([place])   . Diabetes mellitus without complication ([place])   . Family history of adverse reaction to anesthesia    mom seizures   . GERD (gastroesophageal reflux disease)   . History of blood clots in legs   . History of ischemic cardiomyopathy   . 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 Hyperlipidemia   . Hypertension   . Literacy level of illiterate   . Osteomyelitis ([place])   . PN (peripheral neuropathy) ([place])     Past Surgical History:  Procedure Laterality Date  . amputation 4th finger Left   . APPENDECTOMY    . CLAVICLE SURGERY  2012   open reduction and internal fixation of left clavicle  . CORONARY ARTERY BYPASS GRAFT  2002  . FOOT SURGERY    . PACEMAKER INSERTION      Family History  Problem Relation Age of Onset  . Cancer Mother     spine  . Hypertension Mother   . Mental illness Mother   . Cancer Father     lung  . Asthma Father   . Allergic rhinitis Father   . Arthritis Father     Social History:  reports that he quit smoking about 36 years ago. His smoking use included Cigarettes and Cigars. He smoked 0.25 packs per day. He has never used smokeless tobacco. He reports that he does not drink alcohol or use drugs.  Allergies:  Allergies  Allergen Reactions  . Oxycodone-Acetaminophen Other (See Comments)    Causes the pt to be very irritable, says he can take this  .  Percocet [Oxycodone-Acetaminophen] Other (See Comments)    Reaction:  Irritability     Medications:  Scheduled: . aspirin EC  81 mg Oral Daily  . atorvastatin  40 mg Oral QHS  .  ceFAZolin (ANCEF) IV  1 g Intravenous Once  .  ceFAZolin (ANCEF) IV  2 g Intravenous On Call  . chlorhexidine  60 mL Topical Once  . chlorhexidine  60 mL Topical Once  . enoxaparin (LOVENOX) injection  40 mg Subcutaneous Q24H  . furosemide  20 mg Oral Daily  . gabapentin  300 mg Oral TID  . gentamicin irrigation  80 mg Irrigation On Call  . haloperidol  0.5 mg Oral BID  . insulin aspart  0-15 Units Subcutaneous TID WC  . insulin aspart  0-5 Units Subcutaneous QHS  . omega-3 acid ethyl esters  1 g Oral BID  . pantoprazole  40 mg Oral Daily  . ramipril  5 mg Oral Daily  . sodium chloride flush  3 mL Intravenous Q12H  . sodium chloride flush  3 mL Intravenous Q12H  . vitamin B-12  500 mcg Oral Daily    Results for orders placed or performed during the hospital encounter of 12/01/15 (from the past 48 hour(s))  CBC with Differential     Status: Abnormal   Collection Time:  12/01/15  2:42 PM  Result Value Ref Range   WBC 8.5 3.8 - 10.6 K/uL   RBC 4.56 4.40 - 5.90 MIL/uL   Hemoglobin 14.3 13.0 - 18.0 g/dL   HCT 41.0 40.0 - 52.0 %   MCV 90.1 80.0 - 100.0 fL   MCH 31.5 26.0 - 34.0 pg   MCHC 35.0 32.0 - 36.0 g/dL   RDW 16.0 (H) 11.5 - 14.5 %   Platelets 167 150 - 440 K/uL   Neutrophils Relative % 66 %   Neutro Abs 5.6 1.4 - 6.5 K/uL   Lymphocytes Relative 16 %   Lymphs Abs 1.3 1.0 - 3.6 K/uL   Monocytes Relative 13 %   Monocytes Absolute 1.1 (H) 0.2 - 1.0 K/uL   Eosinophils Relative 4 %   Eosinophils Absolute 0.4 0 - 0.7 K/uL   Basophils Relative 1 %   Basophils Absolute 0.1 0 - 0.1 K/uL  Comprehensive metabolic panel     Status: Abnormal   Collection Time: 12/01/15  2:42 PM  Result Value Ref Range   Sodium 140 135 - 145 mmol/L   Potassium 3.5 3.5 - 5.1 mmol/L   Chloride 109 101 - 111 mmol/L    CO2 24 22 - 32 mmol/L   Glucose, Bld 95 65 - 99 mg/dL   BUN 12 6 - 20 mg/dL   Creatinine, Ser 1.25 (H) 0.61 - 1.24 mg/dL   Calcium 9.6 8.9 - 10.3 mg/dL   Total Protein 8.1 6.5 - 8.1 g/dL   Albumin 3.6 3.5 - 5.0 g/dL   AST 22 15 - 41 U/L   ALT 14 (L) 17 - 63 U/L   Alkaline Phosphatase 86 38 - 126 U/L   Total Bilirubin 0.8 0.3 - 1.2 mg/dL   GFR calc non Af Amer 53 (L) >60 mL/min   GFR calc Af Amer >60 >60 mL/min    Comment: (NOTE) The eGFR has been calculated using the CKD EPI equation. This calculation has not been validated in all clinical situations. eGFR's persistently <60 mL/min signify possible Chronic Kidney Disease.    Anion gap 7 5 - 15  Troponin I     Status: None   Collection Time: 12/01/15  2:42 PM  Result Value Ref Range   Troponin I <0.03 <0.03 ng/mL  Urinalysis complete, with microscopic     Status: Abnormal   Collection Time: 12/01/15  3:51 PM  Result Value Ref Range   Color, Urine STRAW (A) YELLOW   APPearance CLEAR (A) CLEAR   Glucose, UA NEGATIVE NEGATIVE mg/dL   Bilirubin Urine NEGATIVE NEGATIVE   Ketones, ur NEGATIVE NEGATIVE mg/dL   Specific Gravity, Urine 1.008 1.005 - 1.030   Hgb urine dipstick NEGATIVE NEGATIVE   pH 5.0 5.0 - 8.0   Protein, ur NEGATIVE NEGATIVE mg/dL   Nitrite NEGATIVE NEGATIVE   Leukocytes, UA NEGATIVE NEGATIVE   RBC / HPF 0-5 0 - 5 RBC/hpf   WBC, UA 0-5 0 - 5 WBC/hpf   Bacteria, UA NONE SEEN NONE SEEN   Squamous Epithelial / LPF NONE SEEN NONE SEEN  Magnesium     Status: None   Collection Time: 12/01/15 11:14 PM  Result Value Ref Range   Magnesium 1.9 1.7 - 2.4 mg/dL  Phosphorus     Status: None   Collection Time: 12/01/15 11:14 PM  Result Value Ref Range   Phosphorus 3.2 2.5 - 4.6 mg/dL  TSH     Status: Abnormal   Collection Time: 12/01/15 11:14 PM  Result Value Ref Range   TSH 0.077 (L) 0.350 - 4.500 uIU/mL    Comment: Performed by a 3rd Generation assay with a functional sensitivity of <=0.01 uIU/mL.  Troponin I      Status: None   Collection Time: 12/01/15 11:14 PM  Result Value Ref Range   Troponin I <0.03 <0.03 ng/mL  Glucose, capillary     Status: Abnormal   Collection Time: 12/01/15 11:33 PM  Result Value Ref Range   Glucose-Capillary 158 (H) 65 - 99 mg/dL  Basic metabolic panel     Status: Abnormal   Collection Time: 12/02/15  4:40 AM  Result Value Ref Range   Sodium 139 135 - 145 mmol/L   Potassium 3.5 3.5 - 5.1 mmol/L   Chloride 112 (H) 101 - 111 mmol/L   CO2 22 22 - 32 mmol/L   Glucose, Bld 93 65 - 99 mg/dL   BUN 10 6 - 20 mg/dL   Creatinine, Ser 1.08 0.61 - 1.24 mg/dL   Calcium 8.7 (L) 8.9 - 10.3 mg/dL   GFR calc non Af Amer >60 >60 mL/min   GFR calc Af Amer >60 >60 mL/min    Comment: (NOTE) The eGFR has been calculated using the CKD EPI equation. This calculation has not been validated in all clinical situations. eGFR's persistently <60 mL/min signify possible Chronic Kidney Disease.    Anion gap 5 5 - 15  CBC     Status: Abnormal   Collection Time: 12/02/15  4:40 AM  Result Value Ref Range   WBC 4.8 3.8 - 10.6 K/uL   RBC 4.29 (L) 4.40 - 5.90 MIL/uL   Hemoglobin 13.4 13.0 - 18.0 g/dL   HCT 39.2 (L) 40.0 - 52.0 %   MCV 91.5 80.0 - 100.0 fL   MCH 31.3 26.0 - 34.0 pg   MCHC 34.2 32.0 - 36.0 g/dL   RDW 15.8 (H) 11.5 - 14.5 %   Platelets 153 150 - 440 K/uL  Troponin I     Status: None   Collection Time: 12/02/15  4:40 AM  Result Value Ref Range   Troponin I <0.03 <0.03 ng/mL  Glucose, capillary     Status: None   Collection Time: 12/02/15  7:56 AM  Result Value Ref Range   Glucose-Capillary 90 65 - 99 mg/dL  Troponin I     Status: None   Collection Time: 12/02/15 10:40 AM  Result Value Ref Range   Troponin I <0.03 <0.03 ng/mL  CBC     Status: Abnormal   Collection Time: 12/02/15 10:40 AM  Result Value Ref Range   WBC 5.0 3.8 - 10.6 K/uL   RBC 4.16 (L) 4.40 - 5.90 MIL/uL   Hemoglobin 13.0 13.0 - 18.0 g/dL   HCT 37.6 (L) 40.0 - 52.0 %   MCV 90.3 80.0 - 100.0 fL    MCH 31.3 26.0 - 34.0 pg   MCHC 34.7 32.0 - 36.0 g/dL   RDW 15.9 (H) 11.5 - 14.5 %   Platelets 150 150 - 440 K/uL  Glucose, capillary     Status: Abnormal   Collection Time: 12/02/15 12:00 PM  Result Value Ref Range   Glucose-Capillary 140 (H) 65 - 99 mg/dL    Ct Head Wo Contrast  Result Date: 12/01/2015 CLINICAL DATA:  Patient with dizziness over the last multiple days. EXAM: CT HEAD WITHOUT CONTRAST TECHNIQUE: Contiguous axial images were obtained from the base of the skull through the vertex without intravenous contrast. COMPARISON:  Brain CT  08/10/2015. FINDINGS: Brain: Ventricles and sulci are prominent compatible with atrophy. Periventricular and subcortical white matter hypodensity compatible with chronic microvascular ischemic changes. Chronic white matter ischemic changes near the right vertex. Old bilateral lacunar infarcts. No evidence for acute cortically based infarct, intracranial hemorrhage, mass lesion or mass-effect. Vascular: Internal carotid artery vascular calcifications. Skull: Intact. Sinuses/Orbits: Ethmoid air cells are unremarkable. Mucosal thickening involving the maxillary and frontal sinuses. Polypoid mucosal thickening right maxillary sinus. Other: None. IMPRESSION: No acute intracranial process. Chronic microvascular ischemic changes. Electronically Signed   By: Lovey Newcomer M.D.   On: 12/01/2015 17:17    Review of Systems  Constitutional: Negative for chills and fever.  HENT: Negative.   Eyes: Negative.   Respiratory: Positive for shortness of breath.   Cardiovascular: Negative.   Gastrointestinal: Negative for nausea and vomiting.  Genitourinary: Negative.   Musculoskeletal: Negative.   Skin:       States that the previous sores on his feet have scabbed up. Does not relate any recent drainage.  Neurological:       Patient does have some numbness and paresthesias in the feet related to his diabetes. Relates current burning sensations in his left third toe area   Endo/Heme/Allergies: Negative.   Psychiatric/Behavioral: Negative.    Blood pressure (!) 132/57, pulse (!) 58, temperature 97.9 F (36.6 C), temperature source Oral, resp. rate 18, height 6' (1.829 m), weight 86.9 kg (191 lb 8 oz), SpO2 96 %. Physical Exam  Cardiovascular:  DP and PT pulses are palpable but diminished, 1 over 4 bilateral. Capillary filling time intact.  Musculoskeletal:  Some dorsal contracture of the right hallux as well as the left fifth toe area. Some hammertoe contractures are noted as well. Muscle testing is normal  Neurological:  Loss of protective threshold with a monofilament wire in the forefoot and toes bilateral.  Skin:  The skin is warm dry and somewhat atrophic with diminished hair growth bilateral. Some bilateral edema and hyperpigmentation noted. Intact scab over the distal aspect of the right second toe as well as beneath the right first metatarsal. Upon debridement of these areas superficial ulcerative areas are noted with no evidence of any purulence or sign of infection. Second toe area measures approximately 5 mm x 3 mm with the first metatarsal area approximately 1 cm diameter    Assessment/Plan: Assessment: 1. Superficial ulcerations right second toe and first metatarsal. 2. Diabetes with associated neuropathy.  Plan: Excisional debridement of superficial devitalized tissue from the ulcerative areas on the right second toe and first metatarsal sharply using a pair of tissue nippers.. Sterile saline wet-to-dry dressing was applied to both of the ulcerative sites. We will have daily dressing changes with Bactroban ointment and a light bandage. Feet at this point appears stable and he will follow-up outpatient on discharge  Durward Fortes 12/02/2015, 1:17 PM
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
## 2015-12-03 ENCOUNTER — Inpatient Hospital Stay: Payer: Medicare PPO | Admitting: Anesthesiology

## 2015-12-03 ENCOUNTER — Encounter
Admission: EM | Disposition: A | Discharge status: Home Health | Payer: Self-pay | Source: Home / Self Care | Attending: Internal Medicine

## 2015-12-03 ENCOUNTER — Ambulatory Visit: Admission: RE | Admit: 2015-12-03 | Payer: Medicare PPO | Source: Ambulatory Visit | Admitting: Cardiology

## 2015-12-03 ENCOUNTER — Encounter: Payer: Self-pay | Admitting: *Deleted

## 2015-12-03 HISTORY — PX: IMPLANTABLE CARDIOVERTER DEFIBRILLATOR (ICD) GENERATOR CHANGE: SHX5469

## 2015-12-03 LAB — HEMOGLOBIN A1C
Hgb A1c MFr Bld: 6 % — ABNORMAL HIGH (ref 4.8–5.6)
Mean Plasma Glucose: 126 mg/dL

## 2015-12-03 LAB — GLUCOSE, CAPILLARY
GLUCOSE-CAPILLARY: 94 mg/dL (ref 65–99)
GLUCOSE-CAPILLARY: 80 mg/dL (ref 65–99)
Glucose-Capillary: 83 mg/dL (ref 65–99)
Glucose-Capillary: 79 mg/dL (ref 65–99)
Glucose-Capillary: 175 mg/dL — ABNORMAL HIGH (ref 65–99)

## 2015-12-03 SURGERY — ICD GENERATOR CHANGE
Anesthesia: Choice

## 2015-12-03 SURGERY — ICD GENERATOR CHANGE
Anesthesia: General | Wound class: Clean

## 2015-12-03 MED ORDER — SODIUM CHLORIDE 0.9 % IR SOLN
Freq: Once | Status: AC
  Administered 2015-12-03: 100 mL
  Filled 2015-12-03: qty 2

## 2015-12-03 MED ORDER — CEFAZOLIN SODIUM-DEXTROSE 2-4 GM/100ML-% IV SOLN
INTRAVENOUS | Status: AC
  Administered 2015-12-03: 2000 mg
  Filled 2015-12-03: qty 100

## 2015-12-03 MED ORDER — MUPIROCIN CALCIUM 2 % EX CREA
TOPICAL_CREAM | Freq: Every day | CUTANEOUS | Status: DC
  Administered 2015-12-04: 04:00:00 via TOPICAL
  Filled 2015-12-03: qty 15

## 2015-12-03 MED ORDER — LIDOCAINE 1 % OPTIME INJ - NO CHARGE
INTRAMUSCULAR | Status: DC | PRN
  Administered 2015-12-03: 20 mL

## 2015-12-03 MED ORDER — FENTANYL CITRATE (PF) 100 MCG/2ML IJ SOLN
INTRAMUSCULAR | Status: DC | PRN
  Administered 2015-12-03 (×2): 50 ug via INTRAVENOUS

## 2015-12-03 MED ORDER — FENTANYL CITRATE (PF) 100 MCG/2ML IJ SOLN: 25.0000 ug | INTRAMUSCULAR | Status: DC | PRN

## 2015-12-03 MED ORDER — PROPOFOL 500 MG/50ML IV EMUL
INTRAVENOUS | Status: DC | PRN
  Administered 2015-12-03: 50 ug/kg/min via INTRAVENOUS

## 2015-12-03 MED ORDER — POTASSIUM CHLORIDE IN NACL 20-0.9 MEQ/L-% IV SOLN
INTRAVENOUS | Status: DC
  Administered 2015-12-03: 15:00:00 via INTRAVENOUS
  Filled 2015-12-03 (×3): qty 1000

## 2015-12-03 MED ORDER — LIDOCAINE HCL (CARDIAC) 20 MG/ML IV SOLN
INTRAVENOUS | Status: DC | PRN
  Administered 2015-12-03: 60 mg via INTRAVENOUS

## 2015-12-03 MED ORDER — MIDAZOLAM HCL 2 MG/2ML IJ SOLN
INTRAMUSCULAR | Status: DC | PRN
  Administered 2015-12-03 (×2): 1 mg via INTRAVENOUS

## 2015-12-03 MED ORDER — ONDANSETRON HCL 4 MG/2ML IJ SOLN: 4.0000 mg | Freq: Four times a day (QID) | INTRAMUSCULAR | Status: DC | PRN

## 2015-12-03 MED ORDER — CEFAZOLIN SODIUM-DEXTROSE 2-3 GM-% IV SOLR
INTRAVENOUS | Status: DC | PRN
  Administered 2015-12-03: 2 g via INTRAVENOUS

## 2015-12-03 MED ORDER — VANCOMYCIN 50 MG/ML ORAL SOLUTION
125.0000 mg | Freq: Four times a day (QID) | ORAL | Status: DC
  Administered 2015-12-03 – 2015-12-04 (×5): 125 mg via ORAL
  Filled 2015-12-03 (×7): qty 2.5

## 2015-12-03 MED ORDER — ONDANSETRON HCL 4 MG/2ML IJ SOLN: 4.0000 mg | Freq: Once | INTRAMUSCULAR | Status: DC | PRN

## 2015-12-03 MED ORDER — CEFAZOLIN IN D5W 1 GM/50ML IV SOLN
1.0000 g | Freq: Four times a day (QID) | INTRAVENOUS | Status: AC
  Administered 2015-12-03 – 2015-12-04 (×3): 1 g via INTRAVENOUS
  Filled 2015-12-03 (×3): qty 50

## 2015-12-03 MED ORDER — HYDROCODONE-ACETAMINOPHEN 5-325 MG PO TABS
1.0000 | ORAL_TABLET | Freq: Four times a day (QID) | ORAL | Status: DC | PRN
  Administered 2015-12-03 – 2015-12-04 (×2): 1 via ORAL
  Filled 2015-12-03 (×3): qty 1

## 2015-12-03 SURGICAL SUPPLY — 45 items
AMPLIA MRI CRT-DFI (Pacemaker) ×2 IMPLANT
BAG DECANTER FOR FLEXI CONT (MISCELLANEOUS) ×2 IMPLANT
BLADE SURG SZ10 CARB STEEL (BLADE) ×2 IMPLANT
BLV/BIS-10 LEAD ADAPTER BIPOLAR/UNIPOLAR ×2 IMPLANT
CANISTER SUCT 1200ML W/VALVE (MISCELLANEOUS) ×2 IMPLANT
CHLORAPREP W/TINT 26ML (MISCELLANEOUS) ×2 IMPLANT
COVER LIGHT HANDLE STERIS (MISCELLANEOUS) ×4 IMPLANT
DEVICE DISSECT PLASMABLAD 3.0S (MISCELLANEOUS) ×1 IMPLANT
DRAPE C-ARM XRAY 36X54 (DRAPES) ×2 IMPLANT
DRAPE INCISE IOBAN 66X45 STRL (DRAPES) ×2 IMPLANT
DRAPE LAPAROTOMY 77X122 PED (DRAPES) ×2 IMPLANT
DRSG TEGADERM 4X4.75 (GAUZE/BANDAGES/DRESSINGS) ×2 IMPLANT
DRSG TEGADERM 6X8 (GAUZE/BANDAGES/DRESSINGS) ×2 IMPLANT
ELECT REM PT RETURN 9FT ADLT (ELECTROSURGICAL) ×2
ELECTRODE REM PT RTRN 9FT ADLT (ELECTROSURGICAL) ×1 IMPLANT
GLOVE BIO SURGEON STRL SZ8 (GLOVE) ×2 IMPLANT
GOWN STRL REUS W/ TWL LRG LVL3 (GOWN DISPOSABLE) ×1 IMPLANT
GOWN STRL REUS W/ TWL XL LVL3 (GOWN DISPOSABLE) ×1 IMPLANT
GOWN STRL REUS W/TWL LRG LVL3 (GOWN DISPOSABLE) ×1
GOWN STRL REUS W/TWL XL LVL3 (GOWN DISPOSABLE) ×1
GRADUATE 1200CC STRL 31836 (MISCELLANEOUS) ×2 IMPLANT
IV NS 1000ML (IV SOLUTION) ×1
IV NS 1000ML BAXH (IV SOLUTION) ×1 IMPLANT
KIT RM TURNOVER STRD PROC AR (KITS) ×2 IMPLANT
KIT WRENCH (KITS) ×2 IMPLANT
NEEDLE FILTER BLUNT 18X 1/2SAF (NEEDLE) ×1
NEEDLE FILTER BLUNT 18X1 1/2 (NEEDLE) ×1 IMPLANT
NEEDLE HYPO 25X1 1.5 SAFETY (NEEDLE) ×2 IMPLANT
NEEDLE SPNL 22GX3.5 QUINCKE BK (NEEDLE) ×2 IMPLANT
NS IRRIG 500ML POUR BTL (IV SOLUTION) ×2 IMPLANT
PACK BASIN MINOR ARMC (MISCELLANEOUS) ×2 IMPLANT
PACK PACE INSERTION (MISCELLANEOUS) ×2 IMPLANT
PAD STATPAD (MISCELLANEOUS) ×2 IMPLANT
PLASMABLADE 3.0S (MISCELLANEOUS) ×2
STRAP SAFETY BODY (MISCELLANEOUS) ×2 IMPLANT
STRIP CLOSURE SKIN 1/2X4 (GAUZE/BANDAGES/DRESSINGS) ×2 IMPLANT
SUT SILK 2 0 SH (SUTURE) ×2 IMPLANT
SUT VIC AB 2-0 CT1 27 (SUTURE) ×1
SUT VIC AB 2-0 CT1 TAPERPNT 27 (SUTURE) ×1 IMPLANT
SUT VIC AB 2-0 CT2 27 (SUTURE) ×2 IMPLANT
SUT VIC AB 3-0 PS2 18 (SUTURE) ×2 IMPLANT
SUT VIC AB 4-0 PS2 18 (SUTURE) ×2 IMPLANT
SYR BULB IRRIG 60ML STRL (SYRINGE) ×2 IMPLANT
SYR CONTROL 10ML (SYRINGE) ×2 IMPLANT
SYRINGE 10CC LL (SYRINGE) ×2 IMPLANT

## 2015-12-03 NOTE — Progress Notes (Signed)
PT Cancellation Note  Patient Details Name: [name] MRN: [id number] DOB: 1934-06-04   Cancelled Treatment:     PT treatment attempted x 2 with pt out of room at procedure in OR.  Will attempt PT at a future date as appropriate.   Thomes Dinningavid Scott Hattye Siegfried 12/03/2015, 1:50 PM

## 2015-12-03 NOTE — Op Note (Signed)
[facility]   12/01/2015 - 12/03/2015                     1:20 PM  PATIENT:  [name]    PRE-OPERATIVE DIAGNOSIS:  battery ERI  POST-OPERATIVE DIAGNOSIS:  Same  PROCEDURE:  ICD GENERATOR CHANGE  SURGEON:  Doni Bacha, MD    ANESTHESIA:     PREOPERATIVE INDICATIONS:  [name] is a  81 y.o. male with a diagnosis of battery ERI who failed conservative measures and elected for surgical management.    The risks benefits and alternatives were discussed with the patient preoperatively including but not limited to the risks of infection, bleeding, cardiopulmonary complications, the need for revision surgery, among others, and the patient was willing to proceed.   OPERATIVE PROCEDURE: The patient was brought to the operating room the fasting state. The left pectoral region was prepped and draped in the usual sterile manner. Anesthesia was obtained 1% lidocaine locally. A 6 cm incision was performed over the left pectoral region. The old ICD generator was retrieved by electrocautery and blunt dissection. The leads were disconnected and connected to a new BiV- ICD generator (Medtronic Amplia MRI CRTD [id number]). The ICD pocket was irrigated with gentamicin solution. The new ICD generator was positioned into the pocket and the pocket was closed with 2-0 and 4-0 Vicryl, respectively. Steri-Strips and a pressure dressing were applied.  .[name]

## 2015-12-03 NOTE — Transfer of Care (Signed)
Immediate Anesthesia Transfer of Care Note  Patient: [name]  Procedure(s) Performed: Procedure(s): ICD GENERATOR CHANGE (N/A)  Patient Location: PACU  Anesthesia Type:General  Level of Consciousness: sedated  Airway & Oxygen Therapy: Patient Spontanous Breathing and Patient connected to nasal cannula oxygen  Post-op Assessment: Report given to RN and Post -op Vital signs reviewed and stable  Post vital signs: Reviewed and stable  Last Vitals:  Vitals:   12/03/15 1000 12/03/15 1124  BP: (!) 142/69 (!) 143/69  Pulse: (!) 54 (!) 51  Resp: 16 16  Temp: 36.3 C 36.6 C    Last Pain:  Vitals:   12/03/15 1124  TempSrc: Oral  PainSc:          Complications: No apparent anesthesia complications

## 2015-12-03 NOTE — Anesthesia Preprocedure Evaluation (Signed)
Anesthesia Evaluation  Patient identified by MRN, date of birth, ID band Patient awake    Reviewed: Allergy & Precautions, NPO status , Patient's Chart, lab work & pertinent test results  Airway Mallampati: II       Dental  (+) Missing, Partial Upper   Pulmonary COPD, former smoker,     + decreased breath sounds      Cardiovascular hypertension, Pt. on medications + CAD  + pacemaker  Rhythm:Regular     Neuro/Psych    GI/Hepatic GERD  ,  Endo/Other  diabetes, Type 1, Insulin Dependent  Renal/GU negative Renal ROS     Musculoskeletal   Abdominal Normal abdominal exam  (+)   Peds  Hematology   Anesthesia Other Findings   Reproductive/Obstetrics                             Anesthesia Physical Anesthesia Plan  ASA: III  Anesthesia Plan: MAC   Post-op Pain Management:    Induction: Intravenous  Airway Management Planned: Natural Airway and Nasal Cannula  Additional Equipment:   Intra-op Plan:   Post-operative Plan:   Informed Consent: I have reviewed the patients History and Physical, chart, labs and discussed the procedure including the risks, benefits and alternatives for the proposed anesthesia with the patient or authorized representative who has indicated his/her understanding and acceptance.     Plan Discussed with: CRNA  Anesthesia Plan Comments:         Anesthesia Quick Evaluation

## 2015-12-03 NOTE — Progress Notes (Signed)
Patient ID: [name], male   DOB: 06/04/1934, 81 y.o.   MRN: [id number]  [employer] [facility] PROGRESS NOTE  [name] [id number] DOB: 05/13/1934 DOA: 12/01/2015 PCP: Baruch Gouty, MD  HPI/Subjective: Loose watery stool yesterday in the evening, medium amount, no blood . Stool cultures were positive for toxigenic C. difficile by PCR, C. difficile antigen was positive, negative for C. difficile toxin.  Dr. Alberteen Spindle evaluated patient's right foot and  superficially debridement. It ulcerated areas of right second toe and first metatarsal, recommended Bactroban ointment daily.. Dr. Darrold Junker recommended Permanent pacemaker battery change, to be performed today. Patient feels good, denies any significant discomfort or dizziness at present. Objective: Vitals:   12/03/15 1000 12/03/15 1124  BP: (!) 142/69 (!) 143/69  Pulse: (!) 54 (!) 51  Resp: 16 16  Temp: 97.4 F (36.3 C) 97.8 F (36.6 C)    Filed Weights   12/01/15 1356 12/02/15 0000 12/03/15 1124  Weight: 90.7 kg (200 lb) 86.9 kg (191 lb 8 oz) 95.3 kg (210 lb)    ROS: Review of Systems  Constitutional: Negative for chills and fever.  Eyes: Negative for blurred vision.  Respiratory: Positive for shortness of breath. Negative for cough.   Cardiovascular: Negative for chest pain.  Gastrointestinal: Negative for abdominal pain, constipation, diarrhea, nausea and vomiting.  Genitourinary: Negative for dysuria.  Musculoskeletal: Negative for joint pain.  Neurological: Positive for dizziness. Negative for headaches.   Exam: Physical Exam  HENT:  Nose: No mucosal edema.  Mouth/Throat: No oropharyngeal exudate or posterior oropharyngeal edema.  Eyes: Conjunctivae, EOM and lids are normal. Pupils are equal, round, and reactive to light.  Neck: No JVD present. Carotid bruit is not present. No edema present. No thyroid mass and no thyromegaly present.  Cardiovascular: S1 normal and S2 normal.  Exam reveals no gallop.   Murmur  heard.  Systolic murmur is present with a grade of 2/6  Pulses:      Dorsalis pedis pulses are 1+ on the right side, and 1+ on the left side.  Respiratory: No respiratory distress. He has no wheezes. He has no rhonchi. He has no rales.  GI: Soft. Bowel sounds are normal. There is no tenderness.  Musculoskeletal:       Right ankle: He exhibits no swelling.       Left ankle: He exhibits no swelling.  Lymphadenopathy:    He has no cervical adenopathy.  Neurological: He is alert. No cranial nerve deficit.  Skin: Skin is warm. Nails show no clubbing.  Right second toe area of blackness on top of a callus. Under bilateral foot he does have small ulcerations with no signs of infection  Psychiatric: He has a normal mood and affect.      Data Reviewed: Basic Metabolic Panel:  Recent Labs Lab 12/01/15 1442 12/01/15 2314 12/02/15 0440  NA 140  --  139  K 3.5  --  3.5  CL 109  --  112*  CO2 24  --  22  GLUCOSE 95  --  93  BUN 12  --  10  CREATININE 1.25*  --  1.08  CALCIUM 9.6  --  8.7*  MG  --  1.9  --   PHOS  --  3.2  --    Liver Function Tests:  Recent Labs Lab 12/01/15 1442  AST 22  ALT 14*  ALKPHOS 86  BILITOT 0.8  PROT 8.1  ALBUMIN 3.6   CBC:  Recent Labs Lab 12/01/15 1442 12/02/15  0440 12/02/15 1040  WBC 8.5 4.8 5.0  NEUTROABS 5.6  --   --   HGB 14.3 13.4 13.0  HCT 41.0 39.2* 37.6*  MCV 90.1 91.5 90.3  PLT 167 153 150   Cardiac Enzymes:  Recent Labs Lab 12/01/15 1442 12/01/15 2314 12/02/15 0440 12/02/15 1040  TROPONINI <0.03 <0.03 <0.03 <0.03   CBG:  Recent Labs Lab 12/02/15 1200 12/02/15 1659 12/02/15 2120 12/03/15 0733 12/03/15 1129  GLUCAP 140* 91 123* 94 80    Recent Results (from the past 240 hour(s))  Surgical pcr screen     Status: None   Collection Time: 11/25/15 11:28 AM  Result Value Ref Range Status   MRSA, PCR NEGATIVE NEGATIVE Final   Staphylococcus aureus NEGATIVE NEGATIVE Final    Comment:        The Xpert SA Assay  (FDA approved for NASAL specimens in patients over 81 years of age), is one component of a comprehensive surveillance program.  Test performance has been validated by [facility] for patients greater than or equal to 47 year old. It is not intended to diagnose infection nor to guide or monitor treatment.   C difficile quick scan w PCR reflex     Status: Abnormal   Collection Time: 12/02/15  4:45 PM  Result Value Ref Range Status   C Diff antigen POSITIVE (A) NEGATIVE Final   C Diff toxin NEGATIVE NEGATIVE Final   C Diff interpretation Results are indeterminate. See PCR results.  Final  Gastrointestinal Panel by PCR , Stool     Status: None   Collection Time: 12/02/15  4:45 PM  Result Value Ref Range Status   Campylobacter species NOT DETECTED NOT DETECTED Final   Plesimonas shigelloides NOT DETECTED NOT DETECTED Final   Salmonella species NOT DETECTED NOT DETECTED Final   Yersinia enterocolitica NOT DETECTED NOT DETECTED Final   Vibrio species NOT DETECTED NOT DETECTED Final   Vibrio cholerae NOT DETECTED NOT DETECTED Final   Enteroaggregative E coli (EAEC) NOT DETECTED NOT DETECTED Final   Enteropathogenic E coli (EPEC) NOT DETECTED NOT DETECTED Final   Enterotoxigenic E coli (ETEC) NOT DETECTED NOT DETECTED Final   Shiga like toxin producing E coli (STEC) NOT DETECTED NOT DETECTED Final   Shigella/Enteroinvasive E coli (EIEC) NOT DETECTED NOT DETECTED Final   Cryptosporidium NOT DETECTED NOT DETECTED Final   Cyclospora cayetanensis NOT DETECTED NOT DETECTED Final   Entamoeba histolytica NOT DETECTED NOT DETECTED Final   Giardia lamblia NOT DETECTED NOT DETECTED Final   Adenovirus F40/41 NOT DETECTED NOT DETECTED Final   Astrovirus NOT DETECTED NOT DETECTED Final   Norovirus GI/GII NOT DETECTED NOT DETECTED Final   Rotavirus A NOT DETECTED NOT DETECTED Final   Sapovirus (I, II, IV, and V) NOT DETECTED NOT DETECTED Final  Clostridium Difficile by PCR     Status: Abnormal    Collection Time: 12/02/15  4:45 PM  Result Value Ref Range Status   Toxigenic C Difficile by pcr POSITIVE (A) NEGATIVE Final    Comment: Positive for toxigenic C. difficile with little to no toxin production. Only treat if clinical presentation suggests symptomatic illness.     Studies: Ct Head Wo Contrast  Result Date: 12/01/2015 CLINICAL DATA:  Patient with dizziness over the last multiple days. EXAM: CT HEAD WITHOUT CONTRAST TECHNIQUE: Contiguous axial images were obtained from the base of the skull through the vertex without intravenous contrast. COMPARISON:  Brain CT 08/10/2015. FINDINGS: Brain: Ventricles and sulci are prominent compatible with  atrophy. Periventricular and subcortical white matter hypodensity compatible with chronic microvascular ischemic changes. Chronic white matter ischemic changes near the right vertex. Old bilateral lacunar infarcts. No evidence for acute cortically based infarct, intracranial hemorrhage, mass lesion or mass-effect. Vascular: Internal carotid artery vascular calcifications. Skull: Intact. Sinuses/Orbits: Ethmoid air cells are unremarkable. Mucosal thickening involving the maxillary and frontal sinuses. Polypoid mucosal thickening right maxillary sinus. Other: None. IMPRESSION: No acute intracranial process. Chronic microvascular ischemic changes. Electronically Signed   By: Annia Beltrew  Davis M.D.   On: 12/01/2015 17:17    Scheduled Meds: . [MAR Hold] aspirin EC  81 mg Oral Daily  . [MAR Hold] atorvastatin  40 mg Oral QHS  . ceFAZolin      .  ceFAZolin (ANCEF) IV  2 g Intravenous On Call  . [MAR Hold] enoxaparin (LOVENOX) injection  40 mg Subcutaneous Q24H  . [MAR Hold] furosemide  20 mg Oral Daily  . [MAR Hold] gabapentin  300 mg Oral TID  . gentamicin irrigation   Irrigation Once  . [MAR Hold] haloperidol  0.5 mg Oral BID  . [MAR Hold] insulin aspart  0-15 Units Subcutaneous TID WC  . [MAR Hold] insulin aspart  0-5 Units Subcutaneous QHS  . [MAR Hold]  mupirocin cream   Topical Daily  . [MAR Hold] omega-3 acid ethyl esters  1 g Oral BID  . [MAR Hold] pantoprazole  40 mg Oral Daily  . [MAR Hold] ramipril  5 mg Oral Daily  . [MAR Hold] sodium chloride flush  3 mL Intravenous Q12H  . sodium chloride flush  3 mL Intravenous Q12H  . [MAR Hold] vancomycin  125 mg Oral Q6H  . [MAR Hold] vitamin B-12  500 mcg Oral Daily   Continuous Infusions: . sodium chloride 50 mL/hr at 12/03/15 0642  . sodium chloride 250 mL (12/03/15 0930)  . sodium chloride    . 0.9 % NaCl with KCl 20 mEq / L      Assessment/Plan:  1. Weakness, dizziness and unsteady gait. No orthostatic hypotension. . Holding Lasix and ramipril. And is undergoing permanent pacemaker battery change today by Dr. Darrold JunkerParaschos.  2. Near syncope. Patient is having a pacemaker battery change today by cardiologist as above 3. Diabetic foot ulcer. Patient underwent superficial debridement of right foot ulcerations by podiatrist, recommended Bactroban daily dressing changes 4. Hyperlipidemia unspecified on Lipitor 5. History of diabetes. Holding metformin due to relatively low blood glucose levels. On sliding scale neuropathy on Neurontin 6    C. difficile colitis, initiate vancomycin orally, follow clinically    Code Status Orders        Start     Ordered   12/01/15 2253  Full code  Continuous     12/01/15 2252    Code Status History    Date Active Date Inactive Code Status Order ID Comments User Context   08/09/2015  6:36 PM 08/12/2015  6:56 PM Full Code [id number]  Arnaldo NatalMichael S Diamond, MD Inpatient   11/14/2014  3:58 PM 11/15/2014  9:35 PM Full Code [id number]  Auburn BilberryShreyang Patel, MD Inpatient     Disposition Plan: Potentially home tomorrow late afternoon versus Wednesday morning, Time spent 35 minutes   Katharina CaperVAICKUTE,Kyani Simkin, MD  Spent 35 minutes   [facility]

## 2015-12-03 NOTE — OR Nursing (Signed)
Dr. Darrold JunkerParaschos in to see pt, advises he wants 2 gm ANCEF on call to OR, order for 1 gm cancelled.

## 2015-12-03 NOTE — Anesthesia Postprocedure Evaluation (Signed)
Anesthesia Post Note  Patient: [name]  Procedure(s) Performed: Procedure(s) (LRB): ICD GENERATOR CHANGE (N/A)  Patient location during evaluation: PACU Anesthesia Type: MAC Level of consciousness: awake Pain management: pain level controlled Vital Signs Assessment: post-procedure vital signs reviewed and stable Respiratory status: spontaneous breathing Cardiovascular status: stable Anesthetic complications: no    Last Vitals:  Vitals:   12/03/15 1124 12/03/15 1322  BP: (!) 143/69 128/64  Pulse: (!) 51 60  Resp: 16 14  Temp: 36.6 C 36.6 C    Last Pain:  Vitals:   12/03/15 1124  TempSrc: Oral  PainSc:                  [name],[name]

## 2015-12-03 NOTE — OR Nursing (Signed)
Per Durel Saltsasey Smith, RN - start abx on call to OR

## 2015-12-04 ENCOUNTER — Telehealth: Payer: Self-pay | Admitting: Family Medicine

## 2015-12-04 ENCOUNTER — Encounter: Payer: Self-pay | Admitting: Cardiology

## 2015-12-04 DIAGNOSIS — E0590 Thyrotoxicosis, unspecified without thyrotoxic crisis or storm: Secondary | ICD-10-CM

## 2015-12-04 DIAGNOSIS — R55 Syncope and collapse: Secondary | ICD-10-CM

## 2015-12-04 DIAGNOSIS — R2681 Unsteadiness on feet: Secondary | ICD-10-CM

## 2015-12-04 DIAGNOSIS — A0472 Enterocolitis due to Clostridium difficile, not specified as recurrent: Secondary | ICD-10-CM

## 2015-12-04 DIAGNOSIS — Z4502 Encounter for adjustment and management of automatic implantable cardiac defibrillator: Secondary | ICD-10-CM

## 2015-12-04 DIAGNOSIS — R531 Weakness: Secondary | ICD-10-CM

## 2015-12-04 LAB — SURGICAL PATHOLOGY

## 2015-12-04 LAB — T4, FREE: Free T4: 1.29 ng/dL — ABNORMAL HIGH (ref 0.61–1.12)

## 2015-12-04 LAB — TSH: TSH: 0.068 u[IU]/mL — ABNORMAL LOW (ref 0.350–4.500)

## 2015-12-04 LAB — GLUCOSE, CAPILLARY
GLUCOSE-CAPILLARY: 84 mg/dL (ref 65–99)
GLUCOSE-CAPILLARY: 112 mg/dL — AB (ref 65–99)
GLUCOSE-CAPILLARY: 84 mg/dL (ref 65–99)

## 2015-12-04 MED ORDER — VANCOMYCIN 50 MG/ML ORAL SOLUTION: 125.0000 mg | Freq: Four times a day (QID) | ORAL | 0 refills | Status: DC

## 2015-12-04 MED ORDER — CEPHALEXIN 250 MG PO CAPS: 250.0000 mg | ORAL_CAPSULE | Freq: Four times a day (QID) | ORAL | 0 refills | Status: DC

## 2015-12-04 MED ORDER — MUPIROCIN CALCIUM 2 % EX CREA: TOPICAL_CREAM | Freq: Every day | CUTANEOUS | 0 refills | Status: DC

## 2015-12-04 NOTE — Discharge Instructions (Signed)
Remove outer dressing 12/05/2015, leave Steri-Strips on, may shower on 12/05/2015.

## 2015-12-04 NOTE — Discharge Summary (Signed)
[facility] - [place] at [facility]   PATIENT NAME: [name]    MR#:  [id number]  DATE OF BIRTH:  [birth year]  DATE OF ADMISSION:  12/01/2015 ADMITTING PHYSICIAN: Jon Gills Hugelmeyer, DO  DATE OF DISCHARGE: No discharge date for patient encounter.  PRIMARY CARE PHYSICIAN: Baruch Gouty, MD     ADMISSION DIAGNOSIS:  Dizziness [R42]  DISCHARGE DIAGNOSIS:  Principal Problem:   Dizziness Active Problems:   Generalized weakness   Hyperthyroidism   Unsteady gait   C. difficile colitis   Near syncope   AICD battery failure   SECONDARY DIAGNOSIS:   Past Medical History:  Diagnosis Date  . Arthritis   . CAD (coronary artery disease)   . COPD (chronic obstructive pulmonary disease) (HCC)   . Diabetes mellitus without complication (HCC)   . Family history of adverse reaction to anesthesia    mom seizures   . GERD (gastroesophageal reflux disease)   . History of blood clots in legs   . History of ischemic cardiomyopathy   . 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 Hyperlipidemia   . Hypertension   . Literacy level of illiterate   . Osteomyelitis (HCC)   . PN (peripheral neuropathy) (HCC)     .pro HOSPITAL COURSE:   The patient is 81 year old male with a history significant for history of coronary artery disease, COPD, diabetes mellitus, ischemic cardiomyopathy, hyperlipidemia, hypertension, who presents to the hospital with complaints of weakness, dizziness, decreased appetite, diarrhea, which started on the day of admission. Patient reported ataxia, falls. On arrival to the hospital patient was noted to have heart rate in 50s, EKG revealed left bundle-branch block, T-wave inversions in 2, 3, V5, V6. Labs revealed mild elevation of creatinine to 1.25, normal cardiac enzymes 3, normal CBC. Patient's urinalysis was unremarkable. Stool culture was positive for toxigenic C. difficile by PCR, negative for C. difficile toxin, unremarkable. Gastrointestinal  panel. Patient was started on vancomycin orally due to  concerns of C. difficile colitis. TSH was checked, was found to be low at 0.0 68, free T4 was high at 1.29, concerning for hyperthyroidism. Patient was seen by neurologist, cardiologist, and underwent ICD generator change on 12/03/2015 by Dr. Darrold Junker. He was seen by physical therapist and recommended home health services. It was felt that patient is stable to be discharged home since his diarrhea subsided with therapy. Patient was advised to follow-up with primary care physician and endocrinologist as outpatient. Discussion by problem: 1. Weakness, dizziness and unsteady gait. No orthostatic hypotension. . Of  Lasix due to intermittent diarrheal stool. The patient underwent ICD battery change 12/03/2015 by Dr. Darrold Junker. He was seen by neurologist, who felt that it was long-standing issue, could have been worsened by decreased by mouth intake and diarrhea/dehydration. Head CT was performed that it was unremarkable, no other neurologic interventions were recommended.  2. Near syncope. Patient  underwent ICD battery change  12/03/2015 by cardiologist . Patient is to follow up with cardiologist as outpatient, he is  hold Lasix and ramipril as long as he has diarrheal stool  3. Diabetic foot ulcer. Patient underwent superficial debridement of right foot ulcerations by podiatrist, DR Alberteen Spindle,  recommended Bactroban daily dressing changes, home health services will be arranged upon discharge. He read the patient is to follow-up with Dr. Alberteen Spindle as outpatient  4. Hyperlipidemia unspecified on Lipitor 5. History of diabetes. Holding metformin due to relatively low blood glucose levels. Resume metformin when oral intake improves. Continue to manage diabetic neuropathy on Neurontin  6  C. difficile colitis, continue vancomycin orally for 13 more days to complete course. Diarrhea has  subsided  7. hyperthyroidism by labs , endocrinology consultation as  outpatient DISCHARGE CONDITIONS:   Stable  CONSULTS OBTAINED:  Treatment Team:  Thana Farr, MD Marcina Millard, MD Dalia Heading, MD  DRUG ALLERGIES:   Allergies  Allergen Reactions  . Oxycodone-Acetaminophen Other (See Comments)    Causes the pt to be very irritable, says he can take this  . Percocet [Oxycodone-Acetaminophen] Other (See Comments)    Reaction:  Irritability     DISCHARGE MEDICATIONS:   Current Discharge Medication List    START taking these medications   Details  mupirocin cream (BACTROBAN) 2 % Apply topically daily. Qty: 15 g, Refills: 0    vancomycin (VANCOCIN) 50 mg/mL oral solution Take 2.5 mLs (125 mg total) by mouth every 6 (six) hours. Qty: 130 mL, Refills: 0      CONTINUE these medications which have NOT CHANGED   Details  atorvastatin (LIPITOR) 40 MG tablet Take 1 tablet (40 mg total) by mouth at bedtime. Qty: 90 tablet, Refills: 0    gabapentin (NEURONTIN) 300 MG capsule Take 1 capsule (300 mg total) by mouth 3 (three) times daily. Qty: 45 capsule, Refills: 0    haloperidol (HALDOL) 0.5 MG tablet Take 0.5 mg by mouth 2 (two) times daily.    Omega-3 Fatty Acids (FISH OIL) 600 MG CAPS Take 300 mg by mouth 2 (two) times daily.    omeprazole (PRILOSEC) 40 MG capsule Take 1 capsule (40 mg total) by mouth daily. Qty: 30 capsule, Refills: 1    OVER THE COUNTER MEDICATION Take 2 capsules by mouth daily. Pt takes OmegaXL.    vitamin B-12 (CYANOCOBALAMIN) 500 MCG tablet Take 500 mcg by mouth daily.      STOP taking these medications     furosemide (LASIX) 20 MG tablet      metFORMIN (GLUCOPHAGE) 500 MG tablet      ramipril (ALTACE) 5 MG capsule          DISCHARGE INSTRUCTIONS:    The patient is to follow-up with primary care physician, but that the wrist, cardiologist, endocrinologist  If you experience worsening of your admission symptoms, develop shortness of breath, life threatening emergency, suicidal or homicidal  thoughts you must seek medical attention immediately by calling 911 or calling your MD immediately  if symptoms less severe.  You Must read complete instructions/literature along with all the possible adverse reactions/side effects for all the Medicines you take and that have been prescribed to you. Take any new Medicines after you have completely understood and accept all the possible adverse reactions/side effects.   Please note  You were cared for by a hospitalist during your hospital stay. If you have any questions about your discharge medications or the care you received while you were in the hospital after you are discharged, you can call the unit and asked to speak with the hospitalist on call if the hospitalist that took care of you is not available. Once you are discharged, your primary care physician will handle any further medical issues. Please note that NO REFILLS for any discharge medications will be authorized once you are discharged, as it is imperative that you return to your primary care physician (or establish a relationship with a primary care physician if you do not have one) for your aftercare needs so that they can reassess your need for medications and monitor your lab values.  Today   CHIEF COMPLAINT:   Chief Complaint  Patient presents with  . Near Syncope    HISTORY OF PRESENT ILLNESS:  [name]  is a 81 y.o. male with a known history of coronary artery disease, COPD, diabetes mellitus, ischemic cardiomyopathy, hyperlipidemia, hypertension, who presents to the hospital with complaints of weakness, dizziness, decreased appetite, diarrhea, which started on the day of admission. Patient reported ataxia, falls. On arrival to the hospital patient was noted to have heart rate in 50s, EKG revealed left bundle-branch block, T-wave inversions in 2, 3, V5, V6. Labs revealed mild elevation of creatinine to 1.25, normal cardiac enzymes 3, normal CBC. Patient's urinalysis  was unremarkable. Stool culture was positive for toxigenic C. difficile by PCR, negative for C. difficile toxin, unremarkable. Gastrointestinal panel. Patient was started on vancomycin orally due to  concerns of C. difficile colitis. TSH was checked, was found to be low at 0.0 68, free T4 was high at 1.29, concerning for hyperthyroidism. Patient was seen by neurologist, cardiologist, and underwent ICD generator change on 12/03/2015 by Dr. Darrold JunkerParaschos. He was seen by physical therapist and recommended home health services. It was felt that patient is stable to be discharged home since his diarrhea subsided with therapy. Patient was advised to follow-up with primary care physician and endocrinologist as outpatient. Discussion by problem: 6. Weakness, dizziness and unsteady gait. No orthostatic hypotension. . Of  Lasix due to intermittent diarrheal stool. The patient underwent ICD battery change 12/03/2015 by Dr. Darrold JunkerParaschos. He was seen by neurologist, who felt that it was long-standing issue, could have been worsened by decreased by mouth intake and diarrhea/dehydration. Head CT was performed that it was unremarkable, no other neurologic interventions were recommended.  7. Near syncope. Patient  underwent ICD battery change  12/03/2015 by cardiologist . Patient is to follow up with cardiologist as outpatient, he is  hold Lasix and ramipril as long as he has diarrheal stool  8. Diabetic foot ulcer. Patient underwent superficial debridement of right foot ulcerations by podiatrist, DR Alberteen Spindleline,  recommended Bactroban daily dressing changes, home health services will be arranged upon discharge. He read the patient is to follow-up with Dr. Alberteen Spindleline as outpatient  9. Hyperlipidemia unspecified on Lipitor 10. History of diabetes. Holding metformin due to relatively low blood glucose levels. Resume metformin when oral intake improves. Continue to manage diabetic neuropathy on Neurontin  6    C. difficile colitis, continue  vancomycin orally for 13 more days to complete course. Diarrhea has  subsided  7. hyperthyroidism by labs , endocrinology consultation as outpatient    VITAL SIGNS:  Blood pressure 130/68, pulse 65, temperature 97.9 F (36.6 C), temperature source Oral, resp. rate 15, height 5\' 10"  (1.778 m), weight 95.3 kg (210 lb), SpO2 99 %.  I/O:    Intake/Output Summary (Last 24 hours) at 12/04/15 1127 Last data filed at 12/04/15 0847  Gross per 24 hour  Intake          3211.67 ml  Output              255 ml  Net          2956.67 ml    PHYSICAL EXAMINATION:  GENERAL:  81 y.o.-year-old patient lying in the bed with no acute distress.  EYES: Pupils equal, round, reactive to light and accommodation. No scleral icterus. Extraocular muscles intact.  HEENT: Head atraumatic, normocephalic. Oropharynx and nasopharynx clear.  NECK:  Supple, no jugular venous distention. No thyroid enlargement, no tenderness.  LUNGS: Normal breath sounds bilaterally, no wheezing, rales,rhonchi or crepitation. No use of accessory muscles of respiration.  CARDIOVASCULAR: S1, S2 normal. No murmurs, rubs, or gallops.  ABDOMEN: Soft, non-tender, non-distended. Bowel sounds present. No organomegaly or mass.  EXTREMITIES: No pedal edema, cyanosis, or clubbing.  NEUROLOGIC: Cranial nerves II through XII are intact. Muscle strength 5/5 in all extremities. Sensation intact. Gait not checked.  PSYCHIATRIC: The patient is alert and oriented x 3.  SKIN: No obvious rash, lesion, or ulcer.   DATA REVIEW:   CBC  Recent Labs Lab 12/02/15 1040  WBC 5.0  HGB 13.0  HCT 37.6*  PLT 150    Chemistries   Recent Labs Lab 12/01/15 1442 12/01/15 2314 12/02/15 0440  NA 140  --  139  K 3.5  --  3.5  CL 109  --  112*  CO2 24  --  22  GLUCOSE 95  --  93  BUN 12  --  10  CREATININE 1.25*  --  1.08  CALCIUM 9.6  --  8.7*  MG  --  1.9  --   AST 22  --   --   ALT 14*  --   --   ALKPHOS 86  --   --   BILITOT 0.8  --   --      Cardiac Enzymes  Recent Labs Lab 12/02/15 1040  TROPONINI <0.03    Microbiology Results  Results for orders placed or performed during the hospital encounter of 12/01/15  C difficile quick scan w PCR reflex     Status: Abnormal   Collection Time: 12/02/15  4:45 PM  Result Value Ref Range Status   C Diff antigen POSITIVE (A) NEGATIVE Final   C Diff toxin NEGATIVE NEGATIVE Final   C Diff interpretation Results are indeterminate. See PCR results.  Final  Gastrointestinal Panel by PCR , Stool     Status: None   Collection Time: 12/02/15  4:45 PM  Result Value Ref Range Status   Campylobacter species NOT DETECTED NOT DETECTED Final   Plesimonas shigelloides NOT DETECTED NOT DETECTED Final   Salmonella species NOT DETECTED NOT DETECTED Final   Yersinia enterocolitica NOT DETECTED NOT DETECTED Final   Vibrio species NOT DETECTED NOT DETECTED Final   Vibrio cholerae NOT DETECTED NOT DETECTED Final   Enteroaggregative E coli (EAEC) NOT DETECTED NOT DETECTED Final   Enteropathogenic E coli (EPEC) NOT DETECTED NOT DETECTED Final   Enterotoxigenic E coli (ETEC) NOT DETECTED NOT DETECTED Final   Shiga like toxin producing E coli (STEC) NOT DETECTED NOT DETECTED Final   Shigella/Enteroinvasive E coli (EIEC) NOT DETECTED NOT DETECTED Final   Cryptosporidium NOT DETECTED NOT DETECTED Final   Cyclospora cayetanensis NOT DETECTED NOT DETECTED Final   Entamoeba histolytica NOT DETECTED NOT DETECTED Final   Giardia lamblia NOT DETECTED NOT DETECTED Final   Adenovirus F40/41 NOT DETECTED NOT DETECTED Final   Astrovirus NOT DETECTED NOT DETECTED Final   Norovirus GI/GII NOT DETECTED NOT DETECTED Final   Rotavirus A NOT DETECTED NOT DETECTED Final   Sapovirus (I, II, IV, and V) NOT DETECTED NOT DETECTED Final  Clostridium Difficile by PCR     Status: Abnormal   Collection Time: 12/02/15  4:45 PM  Result Value Ref Range Status   Toxigenic C Difficile by pcr POSITIVE (A) NEGATIVE Final     Comment: Positive for toxigenic C. difficile with little to no toxin production. Only treat if clinical presentation suggests symptomatic illness.  RADIOLOGY:  No results found.  EKG:   Orders placed or performed during the hospital encounter of 12/01/15  . ED EKG  . ED EKG  . EKG 12-Lead in am (before 8am)  . EKG 12-Lead in am (before 8am)  . EKG 12-Lead  . EKG 12-Lead      Management plans discussed with the patient, family and they are in agreement.  CODE STATUS:     Code Status Orders        Start     Ordered   12/01/15 2253  Full code  Continuous     12/01/15 2252    Code Status History    Date Active Date Inactive Code Status Order ID Comments User Context   08/09/2015  6:36 PM 08/12/2015  6:56 PM Full Code [id number]  Arnaldo Natal, MD Inpatient   11/14/2014  3:58 PM 11/15/2014  9:35 PM Full Code [id number]  Auburn Bilberry, MD Inpatient      TOTAL TIME TAKING CARE OF THIS PATIENT: 40 minutes.    Katharina Caper M.D on 12/04/2015 at 11:27 AM  Between 7am to 6pm - Pager - [phone]  After 6pm go to [email or web address] - password [email or web address] [facility]  [place] [facility]  Office  [phone]  CC: Primary care physician; Baruch Gouty, MD

## 2015-12-04 NOTE — Care Management Note (Signed)
Case Management Note  Patient Details  Name: [name] MRN: [id number] Date of Birth: [birth year]  Subjective/Objective:  Met with patient to discuss home health services. He is agreeable with no agency preference. [facility] to [facility].  Patient states to use his cell phone. Address on chart incorrect. Unable to reach family to very address. Patient lives with grandson [name] [phone]). Inquired regarding DME and patient states he has special shoes he wears and sits in his recliner most of the time. Denies the need for any DME. PCP is [facility]. Positive for Cdiff. It is anticipated that patient will discharge today            Action/Plan: [facility] for SN and PT.   Expected Discharge Date:    12/04/2015              Expected Discharge Plan:  [place]  In-House Referral:     Discharge planning Services  CM Consult  Post Acute Care Choice:  Home Health Choice offered to:  Patient  DME Arranged:    DME Agency:     HH Arranged:  PT, RN [place] Agency:  [facility]  Status of Service:  Completed, signed off  If discussed at [place] of Stay Meetings, dates discussed:    Additional Comments:  Jolly Mango, RN 12/04/2015, 9:25 AM

## 2015-12-04 NOTE — Progress Notes (Signed)
Discharge instructions along with home medication list and follow up gone over with patient. Printed rx given to patient. Made patient aware one prescriptions was sent to pharmacy. Dressing to chest dry and intact, patient made aware to remove dressing in am however not the small steri strips. Iv removed x2, telemetry removed. Patient discharged home on ra no distress noted. Son unable to pick patient up himself, he was able to call for an uber to pick patient up and take home

## 2015-12-04 NOTE — Telephone Encounter (Signed)
[facility] patient a hospital follow up visit for Dizziness on 12/19/15 @ 1:40p

## 2015-12-04 NOTE — Progress Notes (Signed)
A&O. Up with one assist. Medicated for pain during the night. Antibiotics given. IVF infusing.

## 2015-12-08 ENCOUNTER — Encounter: Payer: Self-pay | Admitting: Cardiology

## 2015-12-09 ENCOUNTER — Telehealth: Payer: Self-pay | Admitting: Family Medicine

## 2015-12-09 NOTE — Telephone Encounter (Signed)
[name] from [facility] is requesting a return call. She is asking for a verbal to add a medical social work. Also to address concerns about housing and his medications.  [phone]

## 2015-12-10 LAB — WBCS, STOOL

## 2015-12-11 ENCOUNTER — Encounter: Payer: Self-pay | Admitting: Emergency Medicine

## 2015-12-11 ENCOUNTER — Emergency Department
Admission: EM | Admit: 2015-12-11 | Discharge: 2015-12-11 | Disposition: A | Discharge status: Home / Self Care | Payer: Medicare PPO | Attending: Emergency Medicine | Admitting: Emergency Medicine

## 2015-12-11 DIAGNOSIS — R1013 Epigastric pain: Secondary | ICD-10-CM | POA: Diagnosis present

## 2015-12-11 DIAGNOSIS — E119 Type 2 diabetes mellitus without complications: Secondary | ICD-10-CM | POA: Insufficient documentation

## 2015-12-11 DIAGNOSIS — Z87891 Personal history of nicotine dependence: Secondary | ICD-10-CM | POA: Insufficient documentation

## 2015-12-11 DIAGNOSIS — K219 Gastro-esophageal reflux disease without esophagitis: Secondary | ICD-10-CM | POA: Diagnosis not present

## 2015-12-11 DIAGNOSIS — J449 Chronic obstructive pulmonary disease, unspecified: Secondary | ICD-10-CM | POA: Insufficient documentation

## 2015-12-11 DIAGNOSIS — I2510 Atherosclerotic heart disease of native coronary artery without angina pectoris: Secondary | ICD-10-CM | POA: Insufficient documentation

## 2015-12-11 DIAGNOSIS — Z950 Presence of cardiac pacemaker: Secondary | ICD-10-CM | POA: Insufficient documentation

## 2015-12-11 DIAGNOSIS — Z79899 Other long term (current) drug therapy: Secondary | ICD-10-CM | POA: Diagnosis not present

## 2015-12-11 DIAGNOSIS — I10 Essential (primary) hypertension: Secondary | ICD-10-CM | POA: Insufficient documentation

## 2015-12-11 MED ORDER — METOCLOPRAMIDE HCL 10 MG PO TABS: 10.0000 mg | ORAL_TABLET | Freq: Four times a day (QID) | ORAL | 0 refills | Status: DC | PRN

## 2015-12-11 MED ORDER — OMEPRAZOLE 40 MG PO CPDR: 40.0000 mg | DELAYED_RELEASE_CAPSULE | Freq: Every day | ORAL | 1 refills | Status: DC

## 2015-12-11 MED ORDER — FAMOTIDINE 20 MG PO TABS
40.0000 mg | ORAL_TABLET | Freq: Once | ORAL | Status: AC
  Administered 2015-12-11: 40 mg via ORAL
  Filled 2015-12-11: qty 2

## 2015-12-11 MED ORDER — ALUM & MAG HYDROXIDE-SIMETH 200-200-20 MG/5ML PO SUSP
30.0000 mL | Freq: Once | ORAL | Status: AC
  Administered 2015-12-11: 30 mL via ORAL
  Filled 2015-12-11: qty 30

## 2015-12-11 MED ORDER — METOCLOPRAMIDE HCL 10 MG PO TABS
10.0000 mg | ORAL_TABLET | Freq: Once | ORAL | Status: AC
  Administered 2015-12-11: 10 mg via ORAL
  Filled 2015-12-11: qty 1

## 2015-12-11 NOTE — ED Notes (Signed)
Patients grandson, Billey GoslingCharlie, called and notified that patient is ready to be picked up and will be waiting in the waiting room.

## 2015-12-11 NOTE — ED Provider Notes (Signed)
[facility] Emergency Department Provider Note  ____________________________________________  Time seen: Approximately 1:16 PM  I have reviewed the triage vital signs and the nursing notes.   HISTORY  Chief Complaint Abdominal Pain    HPI [name] is a 81 y.o. male who complains of epigastric pain radiating up to his chest and throat. It is burning and feels just like his GERD. He ran out of his omeprazole several days ago. He denies any other acute complaints. He's been taking all his other medications. He was recently in the hospital with multiple issues, among them C. difficile colitis. He was supposed to take oral vancomycin on discharge but states that he did not fill this because his diarrhea had felt better. He's not had any recurrence of the diarrhea so far.     Past Medical History:  Diagnosis Date  . Arthritis   . CAD (coronary artery disease)   . COPD (chronic obstructive pulmonary disease) (HCC)   . Diabetes mellitus without complication (HCC)   . Family history of adverse reaction to anesthesia    mom seizures   . GERD (gastroesophageal reflux disease)   . History of blood clots in legs   . History of ischemic cardiomyopathy   . 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 Hyperlipidemia   . Hypertension   . Literacy level of illiterate   . Osteomyelitis (HCC)   . PN (peripheral neuropathy) [facility])      Patient Active Problem List   Diagnosis Date Noted  . Generalized weakness 12/04/2015  . Hyperthyroidism 12/04/2015  . Unsteady gait 12/04/2015  . C. difficile colitis 12/04/2015  . Near syncope 12/04/2015  . AICD battery failure 12/04/2015  . Dizziness 12/01/2015  . 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 Abnormal serum protein electrophoresis 12/05/2014  . Pressure ulcer 11/15/2014   . Syncope and collapse 11/14/2014  . Hyperproteinemia 11/02/2014  . Hypoalbuminemia 11/02/2014  . Arthritis 10/23/2014  . Arteriosclerosis of coronary artery 10/23/2014  . Diabetic foot ulcer associated with type 2 diabetes mellitus (HCC) 10/23/2014  . Amputation of finger of left hand 10/23/2014  . History of pulmonary embolism 10/23/2014  . Literacy problem 10/23/2014  . Umbilical hernia without obstruction or gangrene 10/23/2014  . Vitamin B12 deficiency 10/23/2014  . Neuropathy of lower extremity 10/23/2014  . History of osteomyelitis 10/23/2014  . Artificial cardiac pacemaker 08/10/2013  . H/O coronary artery bypass surgery 08/10/2013  . Benign prostatic hyperplasia with urinary obstruction 09/27/2012  . Abnormal prostate specific antigen 09/27/2012     Past Surgical History:  Procedure Laterality Date  . amputation 4th finger Left   . APPENDECTOMY    . CLAVICLE SURGERY  2012   open reduction and internal fixation of left clavicle  . CORONARY ARTERY BYPASS GRAFT  2002  . FOOT SURGERY    . IMPLANTABLE CARDIOVERTER DEFIBRILLATOR (ICD) GENERATOR CHANGE N/A 12/03/2015   Procedure: ICD GENERATOR CHANGE;  Surgeon: Marcina Millard, MD;  Location: [facility] ORS;  Service: Cardiovascular;  Laterality: N/A;  . PACEMAKER INSERTION       Prior to Admission medications   Medication Sig Start Date End Date Taking? Authorizing Provider  atorvastatin (LIPITOR) 40 MG tablet Take 1 tablet (40 mg total) by mouth at bedtime. 11/07/15   Kerman Passey, MD  gabapentin (NEURONTIN) 300 MG capsule Take 1 capsule (300 mg total) by mouth 3 (three) times daily. 10/26/15  Sharman Cheek, MD  haloperidol (HALDOL) 0.5 MG tablet Take 0.5 mg by mouth 2 (two) times daily.    Historical Provider, MD  metoCLOPramide (REGLAN) 10 MG tablet Take 1 tablet (10 mg total) by mouth every 6 (six) hours as needed. 12/11/15   Sharman Cheek, MD  mupirocin cream (BACTROBAN) 2 % Apply topically daily. 12/04/15    Katharina Caper, MD  Omega-3 Fatty Acids (FISH OIL) 600 MG CAPS Take 300 mg by mouth 2 (two) times daily.    Historical Provider, MD  omeprazole (PRILOSEC) 40 MG capsule Take 1 capsule (40 mg total) by mouth daily. 12/11/15 12/10/16  Sharman Cheek, MD  OVER THE COUNTER MEDICATION Take 2 capsules by mouth daily. Pt takes OmegaXL.    Historical Provider, MD  vancomycin (VANCOCIN) 50 mg/mL oral solution Take 2.5 mLs (125 mg total) by mouth every 6 (six) hours. 12/04/15   Katharina Caper, MD  vitamin B-12 (CYANOCOBALAMIN) 500 MCG tablet Take 500 mcg by mouth daily.    Historical Provider, MD     Allergies Oxycodone-acetaminophen and Percocet [oxycodone-acetaminophen]   Family History  Problem Relation Age of Onset  . Cancer Mother     spine  . Hypertension Mother   . Mental illness Mother   . Cancer Father     lung  . Asthma Father   . Allergic rhinitis Father   . Arthritis Father     Social History Social History  Substance Use Topics  . Smoking status: Former Smoker    Packs/day: 0.25    Types: Cigarettes, Cigars    Quit date: 10/18/1979  . Smokeless tobacco: Never Used  . Alcohol use No    Review of Systems  Constitutional:   No fever or chills.  ENT:  Positive sore throat. No rhinorrhea.. Cardiovascular:   No chest pain. Respiratory:   No dyspnea or cough. Gastrointestinal:   Positive epigastric pain. No vomiting or diarrhea.   Musculoskeletal:   Negative for focal pain or swelling Neurological:   Negative for headaches or dizziness 10-point ROS otherwise negative.  ____________________________________________   PHYSICAL EXAM:  VITAL SIGNS: ED Triage Vitals  Enc Vitals Group     BP 12/11/15 1141 (!) 141/67     Pulse Rate 12/11/15 1141 62     Resp 12/11/15 1141 16     Temp 12/11/15 1141 97.5 F (36.4 C)     Temp Source 12/11/15 1141 Oral     SpO2 12/11/15 1141 98 %     Weight 12/11/15 1142 214 lb (97.1 kg)     Height 12/11/15 1142 5\' 10"  (1.778 m)      Head Circumference --      Peak Flow --      Pain Score 12/11/15 1142 6     Pain Loc --      Pain Edu? --      Excl. in GC? --     Vital signs reviewed, nursing assessments reviewed.   Constitutional:   Alert and oriented. Well appearing and in no distress. Eyes:   No scleral icterus. No conjunctival pallor. PERRL. EOMI.  No nystagmus. ENT   Head:   Normocephalic and atraumatic.   Nose:   No congestion/rhinnorhea. No septal hematoma   Mouth/Throat:   MMM, Positive pharyngeal erythema. No peritonsillar mass.    Neck:   No stridor. No SubQ emphysema. No meningismus. Hematological/Lymphatic/Immunilogical:   No cervical lymphadenopathy. Cardiovascular:   RRR. Symmetric bilateral radial and DP pulses.  No murmurs.  Respiratory:  Normal respiratory effort without tachypnea nor retractions. Breath sounds are clear and equal bilaterally. No wheezes/rales/rhonchi. Gastrointestinal:   Soft with mild epigastric and left upper quadrant tenderness. Non distended. There is no CVA tenderness.  No rebound, rigidity, or guarding. Genitourinary:   deferred Musculoskeletal:   Nontender with normal range of motion in all extremities. No joint effusions.  No lower extremity tenderness.  No edema. Neurologic:   Normal speech and language.  CN 2-10 normal. Motor grossly intact. No gross focal neurologic deficits are appreciated.  Skin:    Skin is warm, dry and intact. No rash noted.  No petechiae, purpura, or bullae.  ____________________________________________    LABS (pertinent positives/negatives) (all labs ordered are listed, but only abnormal results are displayed) Labs Reviewed - No data to display ____________________________________________   EKG  Interpreted by me Av paced rhythm, rate of 65. Normal axis and intervals. Left bundle branch block. Diffuse T-wave inversions associated with repolarization abnormality. No acute ischemic  changes.  ____________________________________________    RADIOLOGY    ____________________________________________   PROCEDURES Procedures  ____________________________________________   INITIAL IMPRESSION / ASSESSMENT AND PLAN / ED COURSE  Pertinent labs & imaging results that were available during my care of the patient were reviewed by me and considered in my medical decision making (see chart for details).  Patient's well-appearing no acute distress. Despite multiple medical issues, his only complaint is symptoms entirely consistent with his long history of GERD related to running out of his PPI. he refuses lab draws at this time and clearly has medical decision-making capacity. His well-appearing with unremarkable vital signs. We'll give him a GI cocktail and refill his PPI and discharged home to follow up with primary care. No further workup is needed at this time. Considering the patient's symptoms, medical history, and physical examination today, I have low suspicion for ACS, PE, TAD, pneumothorax, carditis, mediastinitis, pneumonia, CHF, or sepsis. I have low suspicion for cholecystitis or biliary pathology, pancreatitis, perforation or bowel obstruction, hernia, intra-abdominal abscess, AAA or dissection, volvulus or intussusception, mesenteric ischemia, or appendicitis.      Clinical Course   ____________________________________________   FINAL CLINICAL IMPRESSION(S) / ED DIAGNOSES  Final diagnoses:  Epigastric pain  Gastroesophageal reflux disease, esophagitis presence not specified       Portions of this note were generated with dragon dictation software. Dictation errors may occur despite best attempts at proofreading.    Sharman CheekPhillip Xylia Scherger, MD 12/11/15 1329

## 2015-12-11 NOTE — ED Triage Notes (Addendum)
Arrives from home c/o abdominal pain, hand swelling, shortness of breath.  States he has been unable to eat or drink anything since his discharge from the hospital.  Also c/o feeling dizzy. [facility]  Discharge medications included vancomycin, which the patient has not filled.  History of GERD, patient out of prilosec.  Pacemaker site to left chest clean and dry with steri strips.

## 2015-12-11 NOTE — ED Notes (Signed)
Unable to draw blood off IV start. Patient refusing to be stuck again. MD notified and aware.

## 2015-12-11 NOTE — ED Notes (Signed)
Patient states, "I need to be in a nursing home. Look at all of the problems I have wrong with me." Patient states he now lives with his grandson but before that he was living in his truck. Pressured speech noted. Pace maker noted to left chest with new steri strips.

## 2015-12-12 NOTE — Telephone Encounter (Signed)
I returned call to [facility] Left msg, okay for services requested; call me back at office

## 2015-12-19 ENCOUNTER — Ambulatory Visit (INDEPENDENT_AMBULATORY_CARE_PROVIDER_SITE_OTHER): Payer: Medicare PPO | Admitting: Family Medicine

## 2015-12-19 ENCOUNTER — Encounter: Payer: Self-pay | Admitting: Family Medicine

## 2015-12-19 DIAGNOSIS — F0281 Dementia in other diseases classified elsewhere with behavioral disturbance: Secondary | ICD-10-CM

## 2015-12-19 DIAGNOSIS — E8809 Other disorders of plasma-protein metabolism, not elsewhere classified: Secondary | ICD-10-CM

## 2015-12-19 DIAGNOSIS — R42 Dizziness and giddiness: Secondary | ICD-10-CM | POA: Diagnosis not present

## 2015-12-19 DIAGNOSIS — S68119S Complete traumatic metacarpophalangeal amputation of unspecified finger, sequela: Secondary | ICD-10-CM

## 2015-12-19 DIAGNOSIS — D892 Hypergammaglobulinemia, unspecified: Secondary | ICD-10-CM | POA: Diagnosis not present

## 2015-12-19 DIAGNOSIS — E0590 Thyrotoxicosis, unspecified without thyrotoxic crisis or storm: Secondary | ICD-10-CM | POA: Diagnosis not present

## 2015-12-19 DIAGNOSIS — E11621 Type 2 diabetes mellitus with foot ulcer: Secondary | ICD-10-CM

## 2015-12-19 DIAGNOSIS — I2510 Atherosclerotic heart disease of native coronary artery without angina pectoris: Secondary | ICD-10-CM

## 2015-12-19 DIAGNOSIS — N401 Enlarged prostate with lower urinary tract symptoms: Secondary | ICD-10-CM | POA: Diagnosis not present

## 2015-12-19 DIAGNOSIS — S069X9S Unspecified intracranial injury with loss of consciousness of unspecified duration, sequela: Secondary | ICD-10-CM

## 2015-12-19 DIAGNOSIS — G5793 Unspecified mononeuropathy of bilateral lower limbs: Secondary | ICD-10-CM

## 2015-12-19 DIAGNOSIS — A0472 Enterocolitis due to Clostridium difficile, not specified as recurrent: Secondary | ICD-10-CM | POA: Diagnosis not present

## 2015-12-19 DIAGNOSIS — R531 Weakness: Secondary | ICD-10-CM

## 2015-12-19 DIAGNOSIS — E538 Deficiency of other specified B group vitamins: Secondary | ICD-10-CM

## 2015-12-19 DIAGNOSIS — Z950 Presence of cardiac pacemaker: Secondary | ICD-10-CM

## 2015-12-19 DIAGNOSIS — L97411 Non-pressure chronic ulcer of right heel and midfoot limited to breakdown of skin: Secondary | ICD-10-CM

## 2015-12-19 DIAGNOSIS — N138 Other obstructive and reflux uropathy: Secondary | ICD-10-CM

## 2015-12-19 DIAGNOSIS — R778 Other specified abnormalities of plasma proteins: Secondary | ICD-10-CM | POA: Diagnosis not present

## 2015-12-19 DIAGNOSIS — F02818 Dementia in other diseases classified elsewhere, unspecified severity, with other behavioral disturbance: Secondary | ICD-10-CM

## 2015-12-19 NOTE — Assessment & Plan Note (Signed)
Refer to hemeonc

## 2015-12-19 NOTE — Assessment & Plan Note (Signed)
New battery pack October 2017; followed by Dr. Darrold JunkerParaschos

## 2015-12-19 NOTE — Assessment & Plan Note (Signed)
unchanged

## 2015-12-19 NOTE — Assessment & Plan Note (Signed)
Will refer to heme-onc again; I do not see that he ever saw doctor for the abnormal protein studies

## 2015-12-19 NOTE — Assessment & Plan Note (Signed)
stable °

## 2015-12-19 NOTE — Assessment & Plan Note (Addendum)
Patient did not want to take the vanc; denies any diarrhea; reviewed test results from the hospital; start probiotic
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
## 2015-12-19 NOTE — Assessment & Plan Note (Signed)
Recent admission with cardiology; sees cardiologist, Dr. Darrold JunkerParaschos; patient not sure why they stopped his aspirin

## 2015-12-19 NOTE — Assessment & Plan Note (Signed)
Continue supplementation; 1000 mcg daily in bag of medicines

## 2015-12-19 NOTE — Assessment & Plan Note (Signed)
Refer to endocrinologist; hospital note mentioned outpatient referral but patient has not seen endo yet

## 2015-12-19 NOTE — Assessment & Plan Note (Signed)
Multifactorial; will see if nursing home placement can be arranged 

## 2015-12-19 NOTE — Assessment & Plan Note (Signed)
Ulcerative callus pared on the LEFT foot

## 2015-12-19 NOTE — Assessment & Plan Note (Signed)
Refer to [name]

## 2015-12-19 NOTE — Patient Instructions (Signed)
Please start a probiotic once a day; you can pick this up at your local pharmacy Please do see Dr. Alberteen Spindleline soon about your feet We'll have you see the endocrinologist about your thyroid We'll have you see the hematologist-oncologist about your abnormal proteins in your blood If you have not heard anything from my staff in a week about any orders/referrals/studies from today, please contact us here to follow-up [phone]

## 2015-12-19 NOTE — Assessment & Plan Note (Signed)
PSA was 2.6 in June 2017; urination is slow

## 2015-12-19 NOTE — Assessment & Plan Note (Signed)
Multifactorial; will see if nursing home placement can be arranged

## 2015-12-23 ENCOUNTER — Telehealth: Payer: Self-pay

## 2015-12-23 NOTE — Telephone Encounter (Signed)
Dr. Cassie FreerParachos nurse called back states she never seen aspirin on patient meds list before so she will ask dr. If he is suppost to be taking and will notify patient.

## 2015-12-29 ENCOUNTER — Other Ambulatory Visit: Payer: Self-pay | Admitting: Family Medicine

## 2015-12-29 MED ORDER — GABAPENTIN 300 MG PO CAPS: 300.0000 mg | ORAL_CAPSULE | Freq: Three times a day (TID) | ORAL | 5 refills | Status: DC

## 2015-12-29 NOTE — Progress Notes (Signed)
rx for gabapentin approved

## 2016-01-22 ENCOUNTER — Emergency Department
Admission: EM | Admit: 2016-01-22 | Discharge: 2016-01-22 | Disposition: A | Discharge status: Home / Self Care | Payer: Medicare PPO | Attending: Emergency Medicine | Admitting: Emergency Medicine

## 2016-01-22 ENCOUNTER — Emergency Department: Payer: Medicare PPO

## 2016-01-22 DIAGNOSIS — E119 Type 2 diabetes mellitus without complications: Secondary | ICD-10-CM | POA: Insufficient documentation

## 2016-01-22 DIAGNOSIS — Z87891 Personal history of nicotine dependence: Secondary | ICD-10-CM | POA: Diagnosis not present

## 2016-01-22 DIAGNOSIS — Z79899 Other long term (current) drug therapy: Secondary | ICD-10-CM | POA: Diagnosis not present

## 2016-01-22 DIAGNOSIS — R531 Weakness: Secondary | ICD-10-CM | POA: Diagnosis present

## 2016-01-22 DIAGNOSIS — J449 Chronic obstructive pulmonary disease, unspecified: Secondary | ICD-10-CM | POA: Diagnosis not present

## 2016-01-22 DIAGNOSIS — I2510 Atherosclerotic heart disease of native coronary artery without angina pectoris: Secondary | ICD-10-CM | POA: Diagnosis not present

## 2016-01-22 DIAGNOSIS — I10 Essential (primary) hypertension: Secondary | ICD-10-CM | POA: Insufficient documentation

## 2016-01-22 DIAGNOSIS — G63 Polyneuropathy in diseases classified elsewhere: Secondary | ICD-10-CM

## 2016-01-22 DIAGNOSIS — E889 Metabolic disorder, unspecified: Secondary | ICD-10-CM | POA: Diagnosis not present

## 2016-01-22 LAB — BASIC METABOLIC PANEL
Anion gap: 7 (ref 5–15)
BUN: 6 mg/dL (ref 6–20)
CALCIUM: 9.6 mg/dL (ref 8.9–10.3)
CO2: 24 mmol/L (ref 22–32)
CREATININE: 1.02 mg/dL (ref 0.61–1.24)
Chloride: 109 mmol/L (ref 101–111)
GFR calc non Af Amer: >60 mL/min (ref >60)
GLUCOSE: 90 mg/dL (ref 65–99)
Potassium: 3.6 mmol/L (ref 3.5–5.1)
Sodium: 140 mmol/L (ref 135–145)

## 2016-01-22 LAB — CBC WITH DIFFERENTIAL/PLATELET
BASOS PCT: 1 %
Basophils Absolute: 0 10*3/uL (ref 0–0.1)
EOS ABS: 0.3 10*3/uL (ref 0–0.7)
Eosinophils Relative: 5 %
HCT: 41 % (ref 40.0–52.0)
Hemoglobin: 14.3 g/dL (ref 13.0–18.0)
LYMPHS ABS: 1.7 10*3/uL (ref 1.0–3.6)
Lymphocytes Relative: 31 %
MCH: 31.6 pg (ref 26.0–34.0)
MCHC: 34.8 g/dL (ref 32.0–36.0)
MCV: 90.9 fL (ref 80.0–100.0)
MONO ABS: 0.6 10*3/uL (ref 0.2–1.0)
MONOS PCT: 12 %
Neutro Abs: 2.8 10*3/uL (ref 1.4–6.5)
Neutrophils Relative %: 51 %
Platelets: 188 10*3/uL (ref 150–440)
RBC: 4.51 MIL/uL (ref 4.40–5.90)
RDW: 14.2 % (ref 11.5–14.5)
WBC: 5.5 10*3/uL (ref 3.8–10.6)

## 2016-01-22 LAB — TROPONIN I

## 2016-01-22 MED ORDER — GABAPENTIN 100 MG PO CAPS
100.0000 mg | ORAL_CAPSULE | Freq: Once | ORAL | Status: AC
  Administered 2016-01-22: 100 mg via ORAL
  Filled 2016-01-22: qty 1

## 2016-01-22 NOTE — ED Notes (Signed)
Patient transported to X-ray 
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
## 2016-01-22 NOTE — ED Notes (Signed)
Reviewed d/c instructions and follow-up care with pt. Pt verbalized understanding 

## 2016-01-22 NOTE — ED Triage Notes (Signed)
Per EMS: Pt called EMS for SOB; Pt in no acute distress at EMS arrival - O2 97% on RA. Pt then c/o malaise, weakness and chills.   Pt c/o weakness, malaise, chronic bilateral arm/leg burning/numbness. When patient questioned about SOB pt reports mild SOB

## 2016-01-22 NOTE — ED Notes (Signed)
MD McShane at bedside  

## 2016-02-02 ENCOUNTER — Other Ambulatory Visit: Payer: Self-pay | Admitting: Family Medicine

## 2016-02-02 NOTE — Telephone Encounter (Signed)
Patient requesting refill of Gabapentin to [facility].

## 2016-02-14 ENCOUNTER — Emergency Department
Admission: EM | Admit: 2016-02-14 | Discharge: 2016-02-14 | Disposition: A | Discharge status: Home / Self Care | Payer: Medicare PPO | Attending: Emergency Medicine | Admitting: Emergency Medicine

## 2016-02-14 ENCOUNTER — Emergency Department: Payer: Medicare PPO

## 2016-02-14 ENCOUNTER — Encounter: Payer: Self-pay | Admitting: Emergency Medicine

## 2016-02-14 DIAGNOSIS — I10 Essential (primary) hypertension: Secondary | ICD-10-CM | POA: Insufficient documentation

## 2016-02-14 DIAGNOSIS — Y92009 Unspecified place in unspecified non-institutional (private) residence as the place of occurrence of the external cause: Secondary | ICD-10-CM | POA: Insufficient documentation

## 2016-02-14 DIAGNOSIS — I2510 Atherosclerotic heart disease of native coronary artery without angina pectoris: Secondary | ICD-10-CM | POA: Insufficient documentation

## 2016-02-14 DIAGNOSIS — J449 Chronic obstructive pulmonary disease, unspecified: Secondary | ICD-10-CM | POA: Insufficient documentation

## 2016-02-14 DIAGNOSIS — W19XXXA Unspecified fall, initial encounter: Secondary | ICD-10-CM

## 2016-02-14 DIAGNOSIS — Y999 Unspecified external cause status: Secondary | ICD-10-CM | POA: Insufficient documentation

## 2016-02-14 DIAGNOSIS — R450 Nervousness: Secondary | ICD-10-CM

## 2016-02-14 DIAGNOSIS — I719 Aortic aneurysm of unspecified site, without rupture: Secondary | ICD-10-CM | POA: Diagnosis not present

## 2016-02-14 DIAGNOSIS — R42 Dizziness and giddiness: Secondary | ICD-10-CM | POA: Diagnosis not present

## 2016-02-14 DIAGNOSIS — Y939 Activity, unspecified: Secondary | ICD-10-CM | POA: Insufficient documentation

## 2016-02-14 DIAGNOSIS — Z87891 Personal history of nicotine dependence: Secondary | ICD-10-CM | POA: Diagnosis not present

## 2016-02-14 DIAGNOSIS — E119 Type 2 diabetes mellitus without complications: Secondary | ICD-10-CM | POA: Diagnosis not present

## 2016-02-14 DIAGNOSIS — S299XXA Unspecified injury of thorax, initial encounter: Secondary | ICD-10-CM | POA: Diagnosis present

## 2016-02-14 LAB — CBC WITH DIFFERENTIAL/PLATELET
BASOS ABS: 0 10*3/uL (ref 0–0.1)
Basophils Relative: 1 %
EOS ABS: 0.3 10*3/uL (ref 0–0.7)
EOS PCT: 4 %
HCT: 45.5 % (ref 40.0–52.0)
Hemoglobin: 15.5 g/dL (ref 13.0–18.0)
LYMPHS ABS: 1.3 10*3/uL (ref 1.0–3.6)
Lymphocytes Relative: 17 %
MCH: 31 pg (ref 26.0–34.0)
MCHC: 34 g/dL (ref 32.0–36.0)
MCV: 91.1 fL (ref 80.0–100.0)
MONO ABS: 0.6 10*3/uL (ref 0.2–1.0)
Monocytes Relative: 8 %
Neutro Abs: 5.5 10*3/uL (ref 1.4–6.5)
Neutrophils Relative %: 70 %
PLATELETS: 195 10*3/uL (ref 150–440)
RBC: 4.99 MIL/uL (ref 4.40–5.90)
RDW: 14.1 % (ref 11.5–14.5)
WBC: 7.7 10*3/uL (ref 3.8–10.6)

## 2016-02-14 LAB — BASIC METABOLIC PANEL
Anion gap: 6 (ref 5–15)
BUN: 8 mg/dL (ref 6–20)
CO2: 26 mmol/L (ref 22–32)
CREATININE: 1.11 mg/dL (ref 0.61–1.24)
Calcium: 10 mg/dL (ref 8.9–10.3)
Chloride: 108 mmol/L (ref 101–111)
GFR calc Af Amer: >60 mL/min (ref >60)
GLUCOSE: 96 mg/dL (ref 65–99)
Potassium: 3.6 mmol/L (ref 3.5–5.1)
Sodium: 140 mmol/L (ref 135–145)

## 2016-02-14 LAB — TROPONIN I

## 2016-02-14 MED ORDER — IOPAMIDOL (ISOVUE-300) INJECTION 61%
100.0000 mL | Freq: Once | INTRAVENOUS | Status: AC | PRN
  Administered 2016-02-14: 100 mL via INTRAVENOUS

## 2016-02-14 NOTE — ED Notes (Signed)
[name] at bedside answering call light.
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
## 2016-02-14 NOTE — ED Notes (Signed)
After social work consult patient refusing resources to stay at shelter.
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
## 2016-02-14 NOTE — Progress Notes (Signed)
LCSW went to see patient who was in CT scans done and not available for assessment. Will follow up at 5 pm.  Arrie Senatelaudine Molleigh Huot LCSW [phone]
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
## 2016-02-14 NOTE — ED Notes (Signed)
Social work at bedside.  

## 2016-03-18 ENCOUNTER — Other Ambulatory Visit: Payer: Self-pay | Admitting: Family Medicine

## 2016-03-18 NOTE — Telephone Encounter (Signed)
Last sgpt reviewed; Rx approved 

## 2016-04-19 ENCOUNTER — Emergency Department: Payer: Medicare PPO

## 2016-04-19 ENCOUNTER — Encounter: Payer: Self-pay | Admitting: Emergency Medicine

## 2016-04-19 ENCOUNTER — Emergency Department
Admission: EM | Admit: 2016-04-19 | Discharge: 2016-04-19 | Disposition: A | Discharge status: Home / Self Care | Payer: Medicare PPO | Attending: Emergency Medicine | Admitting: Emergency Medicine

## 2016-04-19 DIAGNOSIS — I2510 Atherosclerotic heart disease of native coronary artery without angina pectoris: Secondary | ICD-10-CM | POA: Insufficient documentation

## 2016-04-19 DIAGNOSIS — R61 Generalized hyperhidrosis: Secondary | ICD-10-CM | POA: Diagnosis present

## 2016-04-19 DIAGNOSIS — E119 Type 2 diabetes mellitus without complications: Secondary | ICD-10-CM | POA: Diagnosis not present

## 2016-04-19 DIAGNOSIS — Z87891 Personal history of nicotine dependence: Secondary | ICD-10-CM | POA: Insufficient documentation

## 2016-04-19 DIAGNOSIS — I10 Essential (primary) hypertension: Secondary | ICD-10-CM | POA: Insufficient documentation

## 2016-04-19 DIAGNOSIS — F410 Panic disorder [episodic paroxysmal anxiety] without agoraphobia: Secondary | ICD-10-CM

## 2016-04-19 LAB — COMPREHENSIVE METABOLIC PANEL
ALBUMIN: 4.1 g/dL (ref 3.5–5.0)
ALK PHOS: 91 U/L (ref 38–126)
ALT: 14 U/L — ABNORMAL LOW (ref 17–63)
AST: 27 U/L (ref 15–41)
Anion gap: 8 (ref 5–15)
BILIRUBIN TOTAL: 0.8 mg/dL (ref 0.3–1.2)
BUN: 8 mg/dL (ref 6–20)
CALCIUM: 9.5 mg/dL (ref 8.9–10.3)
CO2: 23 mmol/L (ref 22–32)
CREATININE: 1.06 mg/dL (ref 0.61–1.24)
Chloride: 107 mmol/L (ref 101–111)
GFR calc Af Amer: >60 mL/min (ref >60)
GFR calc non Af Amer: >60 mL/min (ref >60)
Glucose, Bld: 94 mg/dL (ref 65–99)
Potassium: 3.2 mmol/L — ABNORMAL LOW (ref 3.5–5.1)
Sodium: 138 mmol/L (ref 135–145)
TOTAL PROTEIN: 8.3 g/dL — AB (ref 6.5–8.1)

## 2016-04-19 LAB — CBC
HEMATOCRIT: 40.9 % (ref 40.0–52.0)
HEMOGLOBIN: 14 g/dL (ref 13.0–18.0)
MCH: 31.3 pg (ref 26.0–34.0)
MCHC: 34.3 g/dL (ref 32.0–36.0)
MCV: 91.3 fL (ref 80.0–100.0)
Platelets: 201 10*3/uL (ref 150–440)
RBC: 4.47 MIL/uL (ref 4.40–5.90)
RDW: 14.9 % — AB (ref 11.5–14.5)
WBC: 5.3 10*3/uL (ref 3.8–10.6)

## 2016-04-19 LAB — TROPONIN I: Troponin I: <0.03 ng/mL (ref <0.03)

## 2016-04-19 MED ORDER — LORAZEPAM 0.5 MG PO TABS: 0.5000 mg | ORAL_TABLET | Freq: Three times a day (TID) | ORAL | 0 refills | Status: DC | PRN

## 2016-04-19 MED ORDER — LORAZEPAM 1 MG PO TABS
1.0000 mg | ORAL_TABLET | Freq: Once | ORAL | Status: AC
  Administered 2016-04-19: 1 mg via ORAL
  Filled 2016-04-19: qty 1

## 2016-04-19 NOTE — ED Provider Notes (Signed)
[facility] Emergency Department Provider Note        Time seen: ----------------------------------------- 11:44 AM on 04/19/2016 -----------------------------------------    I have reviewed the triage vital signs and the nursing notes.   HISTORY  Chief Complaint Excessive Sweating    HPI [name] is a 82 y.o. male who presents to the ER with sweating episodes and intimately for the last 2 weeks. Patient states he gets very jittery and shaky during these episodes. He has had intermittent foot pain and he does have a history of neuropathy. He thinks he may have had a fever intermittently as well as some chest pain. Nothing makes his symptoms better or worse.   Past Medical History:  Diagnosis Date  . Arthritis   . CAD (coronary artery disease)   . COPD (chronic obstructive pulmonary disease) (HCC)   . Diabetes mellitus without complication (HCC)   . Family history of adverse reaction to anesthesia    mom seizures   . GERD (gastroesophageal reflux disease)   . History of blood clots in legs   . History of ischemic cardiomyopathy   . 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 Hyperlipidemia   . Hypertension   . Literacy level of illiterate   . Osteomyelitis (HCC)   . PN (peripheral neuropathy) [facility])     Patient Active Problem List   Diagnosis Date Noted  . Generalized weakness 12/04/2015  . Hyperthyroidism 12/04/2015  . Unsteady gait 12/04/2015  . C. difficile colitis 12/04/2015  . Near syncope 12/04/2015  . Dizziness 12/01/2015  . 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 Abnormal serum protein electrophoresis 12/05/2014  . Pressure ulcer 11/15/2014  . Syncope and collapse 11/14/2014  . Hyperproteinemia 11/02/2014  . Hypoalbuminemia 11/02/2014  . Arthritis 10/23/2014  . Arteriosclerosis of coronary artery 10/23/2014  .  Diabetic foot ulcer associated with type 2 diabetes mellitus (HCC) 10/23/2014  . Amputation of finger of left hand 10/23/2014  . History of pulmonary embolism 10/23/2014  . Literacy problem 10/23/2014  . Umbilical hernia without obstruction or gangrene 10/23/2014  . Vitamin B12 deficiency 10/23/2014  . Neuropathy of lower extremity 10/23/2014  . History of osteomyelitis 10/23/2014  . Artificial cardiac pacemaker 08/10/2013  . H/O coronary artery bypass surgery 08/10/2013  . Benign prostatic hyperplasia with urinary obstruction 09/27/2012  . Abnormal prostate specific antigen 09/27/2012    Past Surgical History:  Procedure Laterality Date  . amputation 4th finger Left   . APPENDECTOMY    . CLAVICLE SURGERY  2012   open reduction and internal fixation of left clavicle  . CORONARY ARTERY BYPASS GRAFT  2002  . FOOT SURGERY    . IMPLANTABLE CARDIOVERTER DEFIBRILLATOR (ICD) GENERATOR CHANGE N/A 12/03/2015   Procedure: ICD GENERATOR CHANGE;  Surgeon: Marcina Millard, MD;  Location: [facility] ORS;  Service: Cardiovascular;  Laterality: N/A;  . PACEMAKER INSERTION      Allergies Oxycodone-acetaminophen and Percocet [oxycodone-acetaminophen]  Social History Social History  Substance Use Topics  . Smoking status: Former Smoker    Packs/day: 0.25    Types: Cigarettes, Cigars    Quit date: 10/18/1979  . Smokeless tobacco: Never Used  . Alcohol use No    Review of Systems Constitutional:Positive for fever Cardiovascular: Positive for chest pain Respiratory: Negative for shortness of breath. Gastrointestinal: Negative for abdominal pain, vomiting and diarrhea. Genitourinary: Negative for dysuria. Musculoskeletal: Negative for back pain. Skin: Positive for diaphoresis Neurological: Negative for  headaches, focal weakness or numbness. Positive for tremor  10-point ROS otherwise negative.  ____________________________________________   PHYSICAL EXAM:  VITAL SIGNS: ED Triage  Vitals  Enc Vitals Group     BP 04/19/16 1136 (!) 161/92     Pulse Rate 04/19/16 1136 78     Resp 04/19/16 1136 16     Temp 04/19/16 1136 97.8 F (36.6 C)     Temp Source 04/19/16 1136 Oral     SpO2 04/19/16 1136 97 %     Weight 04/19/16 1135 194 lb (88 kg)     Height 04/19/16 1135 5\' 11"  (1.803 m)     Head Circumference --      Peak Flow --      Pain Score --      Pain Loc --      Pain Edu? --      Excl. in GC? --     Constitutional: Alert and oriented. Well appearing and in no distress. Eyes: Conjunctivae are normal. PERRL. Normal extraocular movements. ENT   Head: Normocephalic and atraumatic.   Nose: No congestion/rhinnorhea.   Mouth/Throat: Mucous membranes are moist.   Neck: No stridor. Cardiovascular: Normal rate, regular rhythm. No murmurs, rubs, or gallops. Respiratory: Normal respiratory effort without tachypnea nor retractions. Breath sounds are clear and equal bilaterally. No wheezes/rales/rhonchi. Gastrointestinal: Soft and nontender. Normal bowel sounds Musculoskeletal: Nontender with normal range of motion in all extremities. No lower extremity tenderness nor edema. Neurologic:  Normal speech and language. No gross focal neurologic deficits are appreciated.  Skin:  Skin is warm, dry and intact. No rash noted. Psychiatric: Mood and affect are normal. Speech and behavior are normal.  ____________________________________________  EKG: Interpreted by me. Atrial ventricular dual paced rhythm with a rate of 77 bpm  ____________________________________________  ED COURSE:  Pertinent labs & imaging results that were available during my care of the patient were reviewed by me and considered in my medical decision making (see chart for details). Patient presents to the ER in no distress with vague symptoms. We will assess with labs and consider imaging.   Procedures ____________________________________________   LABS (pertinent  positives/negatives)  Labs Reviewed  CBC - Abnormal; Notable for the following:       Result Value   RDW 14.9 (*)    All other components within normal limits  COMPREHENSIVE METABOLIC PANEL - Abnormal; Notable for the following:    Potassium 3.2 (*)    Total Protein 8.3 (*)    ALT 14 (*)    All other components within normal limits  TROPONIN I    RADIOLOGY Images were viewed by me  Chest x-ray  IMPRESSION: Cardiomegaly with emphysema. No acute cardiopulmonary process. ____________________________________________  FINAL ASSESSMENT AND PLAN  Tremor, diaphoresis  Plan: Patient with labs and imaging as dictated above. Patient clinically was having panic attacks or anxiety-related symptoms. His labs and workup here been reassuring. He is stable for outpatient follow-up.   Emily FilbertWilliams, Tylah Mancillas E, MD   Note: This note was generated in part or whole with voice recognition software. Voice recognition is usually quite accurate but there are transcription errors that can and very often do occur. I apologize for any typographical errors that were not detected and corrected.     Emily FilbertJonathan E Tanayia Wahlquist, MD 04/19/16 1340

## 2016-04-19 NOTE — Progress Notes (Signed)
[facility] who was lying on the bed in the Hallway. Pt stated he had sweat episodes for 2 weeks before presenting himself to hospital today.. Pt appeared calm and talkative. Pt talked about his family, things he did growing up, and was looking forward to go home. Chaplain listened to the Pt's stories and provided a ministry of presence. CH informed the Pt, he was available if needed.     04/19/16 1400  Clinical Encounter Type  Visited With Patient  Visit Type Initial;Spiritual support  Referral From Nurse  Consult/Referral To Chaplain  Spiritual Encounters  Spiritual Needs Prayer

## 2016-04-19 NOTE — ED Notes (Signed)
Patient denies pain and is resting comfortably.  

## 2016-04-19 NOTE — ED Triage Notes (Signed)
C/O "sweating episodes" intermittently x 2 weeks.  C/O intermittent foot pain, patent does have history of neuropathy.

## 2016-04-26 ENCOUNTER — Ambulatory Visit: Payer: Self-pay | Admitting: Family Medicine

## 2016-04-30 ENCOUNTER — Ambulatory Visit (INDEPENDENT_AMBULATORY_CARE_PROVIDER_SITE_OTHER): Payer: Medicare PPO | Admitting: Family Medicine

## 2016-04-30 ENCOUNTER — Encounter: Payer: Self-pay | Admitting: Family Medicine

## 2016-04-30 ENCOUNTER — Other Ambulatory Visit: Payer: Self-pay | Admitting: Family Medicine

## 2016-04-30 VITALS — BP 130/68 | HR 76 | Temp 97.6°F | Resp 16 | Wt 206.1 lb

## 2016-04-30 DIAGNOSIS — E8809 Other disorders of plasma-protein metabolism, not elsewhere classified: Secondary | ICD-10-CM | POA: Diagnosis not present

## 2016-04-30 DIAGNOSIS — L97411 Non-pressure chronic ulcer of right heel and midfoot limited to breakdown of skin: Secondary | ICD-10-CM

## 2016-04-30 DIAGNOSIS — Z550 Illiteracy and low-level literacy: Secondary | ICD-10-CM | POA: Diagnosis not present

## 2016-04-30 DIAGNOSIS — Z608 Other problems related to social environment: Secondary | ICD-10-CM | POA: Diagnosis not present

## 2016-04-30 DIAGNOSIS — Z559 Problems related to education and literacy, unspecified: Secondary | ICD-10-CM

## 2016-04-30 DIAGNOSIS — E11621 Type 2 diabetes mellitus with foot ulcer: Secondary | ICD-10-CM | POA: Diagnosis not present

## 2016-04-30 DIAGNOSIS — G5793 Unspecified mononeuropathy of bilateral lower limbs: Secondary | ICD-10-CM

## 2016-04-30 DIAGNOSIS — E0590 Thyrotoxicosis, unspecified without thyrotoxic crisis or storm: Secondary | ICD-10-CM | POA: Diagnosis not present

## 2016-04-30 DIAGNOSIS — S68119S Complete traumatic metacarpophalangeal amputation of unspecified finger, sequela: Secondary | ICD-10-CM
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
## 2016-04-30 NOTE — Assessment & Plan Note (Signed)
Call 911 highlighted on sheet, instructions read to patient

## 2016-04-30 NOTE — Assessment & Plan Note (Signed)
Check a1c 

## 2016-04-30 NOTE — Patient Instructions (Signed)
Please call 911 if you feel that you are in danger or being threatened We'll contact you about your lab results

## 2016-04-30 NOTE — Assessment & Plan Note (Signed)
Left 4th finger, distal to PIP; sequela

## 2016-04-30 NOTE — Assessment & Plan Note (Signed)
Check labs 

## 2016-04-30 NOTE — Assessment & Plan Note (Signed)
Refer to endo; recheck labs today

## 2016-04-30 NOTE — Assessment & Plan Note (Signed)
Recheck labs 

## 2016-05-01 LAB — BASIC METABOLIC PANEL WITH GFR
BUN: 8 mg/dL (ref 7–25)
CALCIUM: 9.5 mg/dL (ref 8.6–10.3)
CO2: 24 mmol/L (ref 20–31)
Chloride: 107 mmol/L (ref 98–110)
Creat: 0.98 mg/dL (ref 0.70–1.18)
GFR, Est African American: 84 mL/min (ref >=60)
GFR, Est Non African American: 73 mL/min (ref >=60)
GLUCOSE: 143 mg/dL — AB (ref 65–99)
POTASSIUM: 3.6 mmol/L (ref 3.5–5.3)
SODIUM: 140 mmol/L (ref 135–146)

## 2016-05-01 LAB — LIPID PANEL
CHOL/HDL RATIO: 3 ratio (ref <5.0)
Cholesterol: 159 mg/dL (ref <200)
HDL: 53 mg/dL (ref >40)
LDL Cholesterol: 77 mg/dL (ref <100)
Triglycerides: 143 mg/dL (ref <150)
VLDL: 29 mg/dL (ref <30)

## 2016-05-01 LAB — HEMOGLOBIN A1C
HEMOGLOBIN A1C: 5.8 % — AB (ref <5.7)
MEAN PLASMA GLUCOSE: 120 mg/dL

## 2016-05-01 LAB — T4, FREE: Free T4: 1.1 ng/dL (ref 0.8–1.8)

## 2016-05-01 LAB — TSH: TSH: 0.27 m[IU]/L — AB (ref 0.40–4.50)

## 2016-05-03 LAB — T3, FREE: T3, Free: 2.9 pg/mL (ref 2.3–4.2)

## 2016-05-04 LAB — PROTEIN,TOTAL AND ELECTROPHOR W/IFE
ALBUMIN ELP: 4.1 g/dL (ref 3.8–4.8)
ALPHA-1-GLOBULIN: 0.3 g/dL (ref 0.2–0.3)
ALPHA-2-GLOBULIN: 0.5 g/dL (ref 0.5–0.9)
Beta 2: 0.7 g/dL — ABNORMAL HIGH (ref 0.2–0.5)
Beta Globulin: 0.6 g/dL (ref 0.4–0.6)
GAMMA GLOBULIN: 1.7 g/dL (ref 0.8–1.7)
Total Protein, Serum Electrophoresis: 8 g/dL (ref 6.1–8.1)

## 2016-05-05 ENCOUNTER — Other Ambulatory Visit: Payer: Self-pay

## 2016-05-05 DIAGNOSIS — E0590 Thyrotoxicosis, unspecified without thyrotoxic crisis or storm: Secondary | ICD-10-CM

## 2016-05-06 LAB — T3: T3 TOTAL: 143 ng/dL (ref 76–181)

## 2016-05-07 ENCOUNTER — Other Ambulatory Visit: Payer: Self-pay | Admitting: Family Medicine

## 2016-05-07 MED ORDER — BUSPIRONE HCL 5 MG PO TABS: 5.0000 mg | ORAL_TABLET | Freq: Two times a day (BID) | ORAL | 0 refills | Status: DC

## 2016-05-07 NOTE — Telephone Encounter (Signed)
Pt only have 1 pill left of his nerve medication-lorazepam 0.5mg  (it came from the hospital). [facility] [phone]

## 2016-05-07 NOTE — Telephone Encounter (Signed)
Left detailed voicemail

## 2016-05-07 NOTE — Telephone Encounter (Signed)
No, I do not recommend that he take this pill I'll send in something else

## 2016-05-11 ENCOUNTER — Ambulatory Visit (INDEPENDENT_AMBULATORY_CARE_PROVIDER_SITE_OTHER): Payer: Medicare PPO | Admitting: Family Medicine

## 2016-05-11 ENCOUNTER — Encounter: Payer: Self-pay | Admitting: Family Medicine

## 2016-05-11 VITALS — BP 124/68 | HR 76 | Temp 97.6°F | Resp 14 | Wt 201.3 lb

## 2016-05-11 DIAGNOSIS — I2510 Atherosclerotic heart disease of native coronary artery without angina pectoris: Secondary | ICD-10-CM | POA: Diagnosis not present

## 2016-05-11 DIAGNOSIS — G5793 Unspecified mononeuropathy of bilateral lower limbs: Secondary | ICD-10-CM

## 2016-05-11 DIAGNOSIS — Z559 Problems related to education and literacy, unspecified: Secondary | ICD-10-CM

## 2016-05-11 DIAGNOSIS — Z550 Illiteracy and low-level literacy: Secondary | ICD-10-CM | POA: Diagnosis not present

## 2016-05-11 DIAGNOSIS — Z950 Presence of cardiac pacemaker: Secondary | ICD-10-CM

## 2016-05-11 DIAGNOSIS — E0590 Thyrotoxicosis, unspecified without thyrotoxic crisis or storm: Secondary | ICD-10-CM | POA: Diagnosis not present

## 2016-05-11 DIAGNOSIS — Z951 Presence of aortocoronary bypass graft: Secondary | ICD-10-CM | POA: Diagnosis not present

## 2016-05-11 MED ORDER — ASPIRIN EC 81 MG PO TBEC: 81.0000 mg | DELAYED_RELEASE_TABLET | Freq: Every day | ORAL | 11 refills | Status: AC

## 2016-05-11 NOTE — Assessment & Plan Note (Signed)
Referral was placed in November for patient to see endocrinologist; patient has had difficulties with transportation, living situation; social services representative here today helping with the situation; referral placed again to try to get him in to see specialist

## 2016-05-11 NOTE — Assessment & Plan Note (Signed)
Patient overdue for visit with cardiologist; not on aspirin; I personally spoke with his cardiologist who agreed with getting back on aspirin and they will see patient in the office very soon; patient asymptomatic, no chest pain; BP controlled

## 2016-05-11 NOTE — Assessment & Plan Note (Signed)
Patient overdue to see cardiologist; we called that office and got an appointment scheduled for him today

## 2016-05-11 NOTE — Assessment & Plan Note (Signed)
Discussed today by patient; he has a good handle on his medicines; appt with cardiologist written down and shared with social worker

## 2016-05-11 NOTE — Assessment & Plan Note (Signed)
Dense neuropathy; unchanged; on gabapentin

## 2016-05-11 NOTE — Assessment & Plan Note (Signed)
Start back on 81 mg coated aspirin daily
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
## 2016-05-11 NOTE — Patient Instructions (Addendum)
We'll help you get to see the thyroid doctor and the heart doctor Please do see your eye doctor regularly, and have your eyes examined every year (or more often per his or her recommendation) Check your feet every night and let me know right away of any sores, infections, numbness, etc. Try to limit sweets, white bread, white rice, white potatoes It is okay with me for you to not check your fingerstick blood sugars (per [employer] Best Practices), unless you are interested and feel it would be helpful for you Try to limit saturated fats in your diet (bologna, hot dogs, barbeque, cheeseburgers, hamburgers, steak, bacon, sausage, cheese, etc.) and get more fresh fruits, vegetables, and whole grains Start back on an 81 mg coated aspirin, once a day

## 2016-05-18 ENCOUNTER — Telehealth: Payer: Self-pay | Admitting: Family Medicine

## 2016-05-18 NOTE — Telephone Encounter (Signed)
FYI; Dr Patrecia Pace office called to let our office know that the patients insurance has the wrong date of birth. That office is trying to get in touch with the patient to let him know to get that fixed before they can go further with the referral. They are in the process in taking care of this.

## 2016-05-18 NOTE — Telephone Encounter (Signed)
Thank you; I'll forward to office manager

## 2016-05-19 ENCOUNTER — Telehealth: Payer: Self-pay | Admitting: Family Medicine

## 2016-05-19 NOTE — Telephone Encounter (Signed)
[facility] states pt had appt with them today however he did not show.

## 2016-05-19 NOTE — Telephone Encounter (Signed)
Yes he was here this morning trying to get his information fixed with [employer], but was told that he would have to go to the [employer] office in [place] so they can correct the year on his date of birth.   Unfortunately, until then, he will not be able to be seen at any specialty office.   Both [name] and [name] tried to do what they could to help him but was told the same thing, so he was given the information to the [employer] building.   He said he will get his grandson to put the information into his GPS.

## 2016-05-19 NOTE — Telephone Encounter (Signed)
He was just here today; please help facilitate getting him in to see cardiologist; thank you for helping him

## 2016-06-11 ENCOUNTER — Ambulatory Visit: Payer: Self-pay | Admitting: Family Medicine

## 2016-06-14 ENCOUNTER — Encounter: Payer: Self-pay | Admitting: Family Medicine

## 2016-06-14 ENCOUNTER — Ambulatory Visit (INDEPENDENT_AMBULATORY_CARE_PROVIDER_SITE_OTHER): Payer: Medicare PPO | Admitting: Family Medicine

## 2016-06-14 DIAGNOSIS — Z950 Presence of cardiac pacemaker: Secondary | ICD-10-CM

## 2016-06-14 DIAGNOSIS — G5793 Unspecified mononeuropathy of bilateral lower limbs: Secondary | ICD-10-CM | POA: Diagnosis not present

## 2016-06-14 DIAGNOSIS — R778 Other specified abnormalities of plasma proteins: Secondary | ICD-10-CM | POA: Diagnosis not present

## 2016-06-14 DIAGNOSIS — I5022 Chronic systolic (congestive) heart failure: Secondary | ICD-10-CM

## 2016-06-14 DIAGNOSIS — E0590 Thyrotoxicosis, unspecified without thyrotoxic crisis or storm: Secondary | ICD-10-CM

## 2016-06-14 DIAGNOSIS — L97401 Non-pressure chronic ulcer of unspecified heel and midfoot limited to breakdown of skin: Secondary | ICD-10-CM | POA: Diagnosis not present

## 2016-06-14 DIAGNOSIS — I2510 Atherosclerotic heart disease of native coronary artery without angina pectoris: Secondary | ICD-10-CM | POA: Diagnosis not present

## 2016-06-14 DIAGNOSIS — E11621 Type 2 diabetes mellitus with foot ulcer: Secondary | ICD-10-CM | POA: Diagnosis not present

## 2016-06-14 DIAGNOSIS — Z5181 Encounter for therapeutic drug level monitoring: Secondary | ICD-10-CM

## 2016-06-14 DIAGNOSIS — I509 Heart failure, unspecified: Secondary | ICD-10-CM

## 2016-06-14 HISTORY — DX: Heart failure, unspecified: I50.9

## 2016-06-14 MED ORDER — POTASSIUM CHLORIDE ER 10 MEQ PO TBCR: 10.0000 meq | EXTENDED_RELEASE_TABLET | Freq: Every day | ORAL | 0 refills | Status: DC

## 2016-06-14 MED ORDER — FUROSEMIDE 20 MG PO TABS: 20.0000 mg | ORAL_TABLET | Freq: Every day | ORAL | 0 refills | Status: DC

## 2016-06-14 NOTE — Assessment & Plan Note (Signed)
Refer back to Dr. Darrold Junker; start furosemide 20 mg daily along with potassium, recheck BMP in 1 week

## 2016-06-14 NOTE — Progress Notes (Signed)
BP 120/84   Pulse 92   Temp 97.7 F (36.5 C) (Oral)   Resp 14   Wt 202 lb 12.8 oz (92 kg)   SpO2 96%   BMI 28.28 kg/m    Subjective:    Patient ID: [name], male    DOB: Feb 28, 1934, 82 y.o.   MRN: [id number]  HPI: [name] is a 82 y.o. male  Chief Complaint  Patient presents with  . Follow-up    HPI Type 2 diabetes; not checking sugars with my blessing Neuropathy and burning in the feet; can't feel feet up to above the ankles and mid-shin Swelling in the legs; much better now Sees Dr. Darrold Junker at the cardiology clinic; he does not have an appt yet He has not seen the heme-onc doctor about the abnormal proteins in the blood He has not seen the doctor about his thyroid yet He is staying with grandson here in [place]; he says he is safe; he denies neglect His truck broke down and he doesn't have transportion; a friend brought him today He fell in a hole at the place where he lives and hurt his lower back and right side buttock; fell last Wednesday  Depression screen [facility] 2/9 05/11/2016 04/30/2016 12/19/2015 07/18/2015  Decreased Interest 0 0 0 0  Down, Depressed, Hopeless 0 0 0 0  PHQ - 2 Score 0 0 0 0   Relevant past medical, surgical, family and social history reviewed Past Medical History:  Diagnosis Date  . Arthritis   . CAD (coronary artery disease)   . Congestive heart failure (CHF) (HCC) 06/14/2016   LVEF 40-45% 2016 echo; Dr. Darrold Junker  . COPD (chronic obstructive pulmonary disease) (HCC)   . Diabetes mellitus without complication (HCC)   . Family history of adverse reaction to anesthesia    mom seizures   . GERD (gastroesophageal reflux disease)   . History of blood clots in legs   . History of ischemic cardiomyopathy   . 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 Hyperlipidemia   . Hypertension   . Literacy level of illiterate   . Osteomyelitis (HCC)   . PN (peripheral neuropathy)    Past Surgical History:  Procedure Laterality Date  .  amputation 4th finger Left   . APPENDECTOMY    . CLAVICLE SURGERY  2012   open reduction and internal fixation of left clavicle  . CORONARY ARTERY BYPASS GRAFT  2002  . FOOT SURGERY    . IMPLANTABLE CARDIOVERTER DEFIBRILLATOR (ICD) GENERATOR CHANGE N/A 12/03/2015   Procedure: ICD GENERATOR CHANGE;  Surgeon: Marcina Millard, MD;  Location: [facility] ORS;  Service: Cardiovascular;  Laterality: N/A;  . PACEMAKER INSERTION     Family History  Problem Relation Age of Onset  . Cancer Mother     spine  . Hypertension Mother   . Mental illness Mother   . Cancer Father     lung  . Asthma Father   . Allergic rhinitis Father   . Arthritis Father    Social History  Substance Use Topics  . Smoking status: Never Smoker  . Smokeless tobacco: Never Used  . Alcohol use No   Interim medical history since last visit reviewed. Allergies and medications reviewed  Review of Systems Per HPI unless specifically indicated above     Objective:    BP 120/84   Pulse 92   Temp 97.7 F (36.5 C) (Oral)   Resp 14   Wt 202 lb 12.8 oz (92 kg)   SpO2  96%   BMI 28.28 kg/m   Wt Readings from Last 3 Encounters:  06/14/16 202 lb 12.8 oz (92 kg)  05/11/16 201 lb 4.8 oz (91.3 kg)  04/30/16 206 lb 1.6 oz (93.5 kg)    Physical Exam  Constitutional: He appears well-developed and well-nourished. No distress.  HENT:  Head: Normocephalic and atraumatic.  Eyes: No scleral icterus.  Neck: No JVD present. No thyromegaly present.  Cardiovascular: Normal rate and regular rhythm.   Pulmonary/Chest: Effort normal and breath sounds normal.  Abdominal: Soft. Bowel sounds are normal. He exhibits no distension. There is no tenderness.  Musculoskeletal:  Status post partial amputation finger left hand  Neurological: He displays no tremor.  Skin: He is not diaphoretic. No pallor.  Psychiatric: His mood appears not anxious. He does not exhibit a depressed mood.  Talkative but redirectable; fair historian    Diabetic Foot Form - Detailed   Diabetic Foot Exam - detailed Diabetic Foot exam was performed with the following findings:  Yes 06/14/2016  3:05 PM  Is there a history of foot ulcer?:  Yes Are the toenails ingrown?:  No Normal Range of Motion:  Yes Pulse Foot Exam completed.:  Yes  Right Dorsalis Pedis:  Present Left Dorsalis Pedis:  Present  Sensory Foot Exam Completed.:  Yes Swelling:  No Semmes-Weinstein Monofilament Test R Site 1-Great Toe:  Neg L Site 1-Great Toe:  Neg  R Site 4:  Neg L Site 4:  Neg  R Site 5:  Neg L Site 5:  Neg    Comments:  Dense neuropathy of both feet, lower legs     Results for orders placed or performed in visit on 05/05/16  T3  Result Value Ref Range   T3, Total 143.0 76 - 181 ng/dL      Assessment & Plan:   Problem List Items Addressed This Visit      Cardiovascular and Mediastinum   Congestive heart failure (CHF) (HCC)    Refer back to Dr. Darrold Junker; start furosemide 20 mg daily along with potassium, recheck BMP in 1 week      Relevant Medications   furosemide (LASIX) 20 MG tablet   Other Relevant Orders   Ambulatory referral to Cardiology   Arteriosclerosis of coronary artery (Chronic)    Refer back to Dr. Darrold Junker      Relevant Medications   furosemide (LASIX) 20 MG tablet   Other Relevant Orders   Ambulatory referral to Cardiology     Endocrine   Hyperthyroidism    Patient has not seen endocrinologist yet, so will ask staff to check on that and work with patient      Diabetic foot ulcer associated with type 2 diabetes mellitus (HCC) (Chronic)    Calluses on the soles of both feet, no active infection; watch carefully, patient will check every night and notify me right away of any sores        Nervous and Auditory   Neuropathy of lower extremity (Chronic)    I do not think this is related to his diabetes; will refer to heme-onc to see if abnormal proteins      Relevant Orders   Ambulatory referral to Hematology /  Oncology     Other   Medication monitoring encounter   Relevant Orders   Basic Metabolic Panel (BMET)   Artificial cardiac pacemaker    Refer back to cardiologist      Relevant Orders   Ambulatory referral to Cardiology   Abnormal serum protein  electrophoresis    Refer back to heme-onc      Relevant Orders   Ambulatory referral to Hematology / Oncology       Follow up plan: Return in about 1 week (around 06/21/2016) for follow-up visit with Dr. Sherie Don.  An after-visit summary was printed and given to the patient at check-out.  Please see the patient instructions which may contain other information and recommendations beyond what is mentioned above in the assessment and plan.  Meds ordered this encounter  Medications  . furosemide (LASIX) 20 MG tablet    Sig: Take 1 tablet (20 mg total) by mouth daily.    Dispense:  10 tablet    Refill:  0  . potassium chloride (KLOR-CON 10) 10 MEQ tablet    Sig: Take 1 tablet (10 mEq total) by mouth daily.    Dispense:  10 tablet    Refill:  0    Orders Placed This Encounter  Procedures  . Basic Metabolic Panel (BMET)  . Ambulatory referral to Cardiology  . Ambulatory referral to Hematology / Oncology

## 2016-06-14 NOTE — Assessment & Plan Note (Signed)
Refer back to heme-onc 

## 2016-06-14 NOTE — Assessment & Plan Note (Signed)
Refer back to Dr. Darrold Junker

## 2016-06-14 NOTE — Assessment & Plan Note (Signed)
Refer back to cardiologist 

## 2016-06-14 NOTE — Patient Instructions (Addendum)
Start the fluid pills and the potassium, just one a day of each Return in 1 week Avoid salt as much as possible Heart Failure Heart failure means your heart has trouble pumping blood. This makes it hard for your body to work well. Heart failure is usually a long-term (chronic) condition. You must take good care of yourself and follow your doctor's treatment plan. Follow these instructions at home:  Take your heart medicine as told by your doctor.  Do not stop taking medicine unless your doctor tells you to.  Do not skip any dose of medicine.  Refill your medicines before they run out.  Take other medicines only as told by your doctor or pharmacist.  Stay active if told by your doctor. The elderly and people with severe heart failure should talk with a doctor about physical activity.  Eat heart-healthy foods. Choose foods that are without trans fat and are low in saturated fat, cholesterol, and salt (sodium). This includes fresh or frozen fruits and vegetables, fish, lean meats, fat-free or low-fat dairy foods, whole grains, and high-fiber foods. Lentils and dried peas and beans (legumes) are also good choices.  Limit salt if told by your doctor.  Cook in a healthy way. Roast, grill, broil, bake, poach, steam, or stir-fry foods.  Limit fluids as told by your doctor.  Weigh yourself every morning. Do this after you pee (urinate) and before you eat breakfast. Write down your weight to give to your doctor.  Take your blood pressure and write it down if your doctor tells you to.  Ask your doctor how to check your pulse. Check your pulse as told.  Lose weight if told by your doctor.  Stop smoking or chewing tobacco. Do not use gum or patches that help you quit without your doctor's approval.  Schedule and go to doctor visits as told.  Nonpregnant women should have no more than 1 drink a day. Men should have no more than 2 drinks a day. Talk to your doctor about drinking  alcohol.  Stop illegal drug use.  Stay current with shots (immunizations).  Manage your health conditions as told by your doctor.  Learn to manage your stress.  Rest when you are tired.  If it is really hot outside:  Avoid intense activities.  Use air conditioning or fans, or get in a cooler place.  Avoid caffeine and alcohol.  Wear loose-fitting, lightweight, and light-colored clothing.  If it is really cold outside:  Avoid intense activities.  Layer your clothing.  Wear mittens or gloves, a hat, and a scarf when going outside.  Avoid alcohol.  Learn about heart failure and get support as needed.  Get help to maintain or improve your quality of life and your ability to care for yourself as needed. Contact a doctor if:  You gain weight quickly.  You are more short of breath than usual.  You cannot do your normal activities.  You tire easily.  You cough more than normal, especially with activity.  You have any or more puffiness (swelling) in areas such as your hands, feet, ankles, or belly (abdomen).  You cannot sleep because it is hard to breathe.  You feel like your heart is beating fast (palpitations).  You get dizzy or light-headed when you stand up. Get help right away if:  You have trouble breathing.  There is a change in mental status, such as becoming less alert or not being able to focus.  You have chest pain or  discomfort.  You faint. This information is not intended to replace advice given to you by your health care provider. Make sure you discuss any questions you have with your health care provider. Document Released: 11/11/2007 Document Revised: 07/10/2015 Document Reviewed: 03/20/2012 Elsevier Interactive Patient Education  2017 [employer].

## 2016-06-14 NOTE — Assessment & Plan Note (Signed)
I do not think this is related to his diabetes; will refer to heme-onc to see if abnormal proteins

## 2016-06-14 NOTE — Assessment & Plan Note (Signed)
Patient has not seen endocrinologist yet, so will ask staff to check on that and work with patient

## 2016-06-14 NOTE — Assessment & Plan Note (Signed)
Calluses on the soles of both feet, no active infection; watch carefully, patient will check every night and notify me right away of any sores

## 2016-06-18 ENCOUNTER — Telehealth: Payer: Self-pay | Admitting: Family Medicine

## 2016-06-18 NOTE — Telephone Encounter (Signed)
Sandy from [facility] states pt was referred to them today and did not show for his appt [phone]

## 2016-06-21 ENCOUNTER — Ambulatory Visit: Payer: Self-pay | Admitting: Family Medicine

## 2016-06-23 ENCOUNTER — Ambulatory Visit (INDEPENDENT_AMBULATORY_CARE_PROVIDER_SITE_OTHER): Payer: Medicare Other | Admitting: Family Medicine

## 2016-06-23 ENCOUNTER — Encounter: Payer: Self-pay | Admitting: Family Medicine

## 2016-06-23 VITALS — BP 126/82 | HR 67 | Temp 98.2°F | Resp 14 | Wt 207.3 lb

## 2016-06-23 DIAGNOSIS — I5022 Chronic systolic (congestive) heart failure: Secondary | ICD-10-CM | POA: Diagnosis not present

## 2016-06-23 DIAGNOSIS — E0590 Thyrotoxicosis, unspecified without thyrotoxic crisis or storm: Secondary | ICD-10-CM

## 2016-06-23 DIAGNOSIS — Z5181 Encounter for therapeutic drug level monitoring: Secondary | ICD-10-CM | POA: Diagnosis not present

## 2016-06-23 DIAGNOSIS — L97401 Non-pressure chronic ulcer of unspecified heel and midfoot limited to breakdown of skin: Secondary | ICD-10-CM

## 2016-06-23 DIAGNOSIS — E11621 Type 2 diabetes mellitus with foot ulcer: Secondary | ICD-10-CM | POA: Diagnosis not present

## 2016-06-23 DIAGNOSIS — Z950 Presence of cardiac pacemaker: Secondary | ICD-10-CM

## 2016-06-23 DIAGNOSIS — M791 Myalgia: Secondary | ICD-10-CM

## 2016-06-23 DIAGNOSIS — I2510 Atherosclerotic heart disease of native coronary artery without angina pectoris: Secondary | ICD-10-CM | POA: Diagnosis not present

## 2016-06-23 DIAGNOSIS — G5793 Unspecified mononeuropathy of bilateral lower limbs: Secondary | ICD-10-CM

## 2016-06-23 DIAGNOSIS — M7918 Myalgia, other site: Secondary | ICD-10-CM

## 2016-06-23 LAB — BASIC METABOLIC PANEL
BUN: 16 mg/dL (ref 7–25)
CO2: 22 mmol/L (ref 20–31)
Calcium: 9.6 mg/dL (ref 8.6–10.3)
Chloride: 106 mmol/L (ref 98–110)
Creat: 1.23 mg/dL — ABNORMAL HIGH (ref 0.70–1.18)
Glucose, Bld: 104 mg/dL — ABNORMAL HIGH (ref 65–99)
POTASSIUM: 4.2 mmol/L (ref 3.5–5.3)
SODIUM: 137 mmol/L (ref 135–146)

## 2016-06-23 MED ORDER — FUROSEMIDE 20 MG PO TABS: 20.0000 mg | ORAL_TABLET | Freq: Every day | ORAL | 0 refills | Status: DC

## 2016-06-23 MED ORDER — POTASSIUM CHLORIDE ER 10 MEQ PO TBCR: 10.0000 meq | EXTENDED_RELEASE_TABLET | Freq: Every day | ORAL | 0 refills | Status: DC

## 2016-06-23 NOTE — Assessment & Plan Note (Addendum)
Refer to Dr. Alberteen Spindleline (podiatry); no evidence of infection today; dense neuropathy exacerbates this; his diabetes is so well-controlled; I am concerned that neuropathy from another cause (paraproteinemia, e.g.) and he'll be seeing heme-onc next week I believe

## 2016-06-23 NOTE — Patient Instructions (Addendum)
Please do see Dr. Darrold JunkerParaschos about your heart failure and fluid in your legs Avoid salt as much as possible We'll have you see the foot doctor Please do see the doctor on Monday about your blood proteins Keep taking the fluid pills and the potassium pills every day unless Dr. Darrold JunkerParaschos makes changes to your medicines

## 2016-06-23 NOTE — Progress Notes (Signed)
BP 126/82   Pulse 67   Temp 98.2 F (36.8 C) (Oral)   Resp 14   Wt 207 lb 4.8 oz (94 kg)   SpO2 98%   BMI 28.91 kg/m    Subjective:    Patient ID: [name], male    DOB: 1934/05/15, 82 y.o.   MRN: [id number]  HPI: [name] is a 82 y.o. male  Chief Complaint  Patient presents with  . Follow-up   HPI  Patient is here for f/u Ulcers coming back on the bottom of the foot; used red clay and egg skin and onions for healing in the past Used to see Dr. Alberteen Spindleline (podiatrist) and would be glad to get back in to see him again  He was found to have abnormal serum proteins and I referred him to heme-onc, but patient reports he has not seen that doctor yet  CHF; just finished a 7 day course of furosemide and potassium; he has apparently not seen his heart doctor for quite some time; I put in an official referral back to Dr. Darrold Junkerparaschos on April 30th but patient says he has not heard about a visit yet; he has swelling in his legs but says they are better than before; he is not on an ACE-I, ARB, or beta-blocker; I did get him back on an aspirin and he is taking that  His mood is good, his energy level is good; he stumbled a few weeks ago and continues to have discomfort along the right buttock area and right wrist  [facility] 05/11/2016 04/30/2016 12/19/2015 07/18/2015  Decreased Interest 0 0 0 0  Down, Depressed, Hopeless 0 0 0 0  PHQ - 2 Score 0 0 0 0    Relevant past medical, surgical, family and social history reviewed Past Medical History:  Diagnosis Date  . Arthritis   . CAD (coronary artery disease)   . Congestive heart failure (CHF) (HCC) 06/14/2016   LVEF 40-45% 2016 echo; Dr. Darrold JunkerParaschos  . COPD (chronic obstructive pulmonary disease) (HCC)   . Diabetes mellitus without complication (HCC)   . Family history of adverse reaction to anesthesia    mom seizures   . GERD (gastroesophageal reflux disease)   . History of blood clots in legs   . History of  ischemic cardiomyopathy   . 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 Hyperlipidemia   . Hypertension   . Literacy level of illiterate   . Osteomyelitis (HCC)   . PN (peripheral neuropathy)    Past Surgical History:  Procedure Laterality Date  . amputation 4th finger Left   . APPENDECTOMY    . CLAVICLE SURGERY  2012   open reduction and internal fixation of left clavicle  . CORONARY ARTERY BYPASS GRAFT  2002  . FOOT SURGERY    . IMPLANTABLE CARDIOVERTER DEFIBRILLATOR (ICD) GENERATOR CHANGE N/A 12/03/2015   Procedure: ICD GENERATOR CHANGE;  Surgeon: Marcina MillardAlexander Paraschos, MD;  Location: [facility] ORS;  Service: Cardiovascular;  Laterality: N/A;  . PACEMAKER INSERTION     Family History  Problem Relation Age of Onset  . Cancer Mother        spine  . Hypertension Mother   . Mental illness Mother   . Cancer Father        lung  . Asthma Father   . Allergic rhinitis Father   . Arthritis Father    Social History   Social History  . Marital status: Divorced    Spouse name: N/A  .  Number of children: N/A  . Years of education: N/A   Occupational History  . Not on file.   Social History Main Topics  . Smoking status: Never Smoker  . Smokeless tobacco: Never Used  . Alcohol use No  . Drug use: No  . Sexual activity: Not on file   Other Topics Concern  . Not on file   Social History Narrative  . No narrative on file    Interim medical history since last visit reviewed. Allergies and medications reviewed  Review of Systems Per HPI unless specifically indicated above     Objective:    BP 126/82   Pulse 67   Temp 98.2 F (36.8 C) (Oral)   Resp 14   Wt 207 lb 4.8 oz (94 kg)   SpO2 98%   BMI 28.91 kg/m   Wt Readings from Last 3 Encounters:  06/23/16 207 lb 4.8 oz (94 kg)  06/14/16 202 lb 12.8 oz (92 kg)  05/11/16 201 lb 4.8 oz (91.3 kg)    Physical Exam  Constitutional: He appears well-developed and well-nourished. No distress.  Weight gain almost five pounds in the  last 9-10 days  HENT:  Head: Normocephalic and atraumatic.  Eyes: No scleral icterus.  Neck: No JVD present. No thyromegaly present.  Cardiovascular: Normal rate and regular rhythm.   Pulmonary/Chest: Effort normal and breath sounds normal. He has no rales.  Abdominal: Soft. Bowel sounds are normal. He exhibits no distension. There is no tenderness. A hernia (umbilical) is present.  Musculoskeletal: He exhibits edema (1-2+ pitting edema of both lower extremities).  Status post partial amputation finger left hand  Neurological: He displays no tremor.  Skin: He is not diaphoretic. No pallor.  Psychiatric: His mood appears not anxious. He does not exhibit a depressed mood.  Talkative but redirectable; fair historian   Diabetic Foot Form - Detailed   Diabetic Foot Exam - detailed Diabetic Foot exam was performed with the following findings:  Yes 06/23/2016 10:53 AM  Visual Foot Exam completed.:  Yes  Is there a history of foot ulcer?:  Yes Can the patient see the bottom of their feet?:  Yes Are the toenails long?:  Yes Are the toenails thick?:  Yes Are the toenails ingrown?:  No Pulse Foot Exam completed.:  Yes  Right Dorsalis Pedis:  Diminished Left Dorsalis Pedis:  Diminished  Swelling:  Yes Semmes-Weinstein Monofilament Test R Site 1-Great Toe:  Neg L Site 1-Great Toe:  Neg  R Site 4:  Neg L Site 4:  Neg  R Site 5:  Neg L Site 5:  Neg    Comments:  preulcerative callus both feet, balls of foot, flap        Assessment & Plan:   Problem List Items Addressed This Visit      Cardiovascular and Mediastinum   Congestive heart failure (CHF) (HCC) - Primary    Managed by cardiologist; however, the patient has not been seen there in quite some time; I will extend his furosemide plus potassium, but really want to get him in for medical management (ACE-I, ARB, beta-blocker, etc per cardiologist's direction); avoid salt      Relevant Medications   furosemide (LASIX) 20 MG tablet    Arteriosclerosis of coronary artery (Chronic)    I have patient back on aspirin; will try to get him back in to see his cardiologist      Relevant Medications   furosemide (LASIX) 20 MG tablet     Endocrine  Hyperthyroidism    Patient was referred to endocrinologist for this concern      Diabetic foot ulcer associated with type 2 diabetes mellitus (HCC) (Chronic)    Refer to Dr. Alberteen Spindle (podiatry); no evidence of infection today; dense neuropathy exacerbates this; his diabetes is so well-controlled; I am concerned that neuropathy from another cause (paraproteinemia, e.g.) and he'll be seeing heme-onc next week I believe      Relevant Orders   Ambulatory referral to Podiatry     Nervous and Auditory   Neuropathy of lower extremity (Chronic)    Patient will be seeing heme-onc for the neuropathy; I suspect other etiology besides the diabetes, such as paraproteinemia      Relevant Medications   busPIRone (BUSPAR) 5 MG tablet     Other   Medication monitoring encounter    Check potassium and creatinine on diuretic      Artificial cardiac pacemaker    Trying to get patient back in to see his cardiologist       Other Visit Diagnoses    Buttock pain       Relevant Orders   Ambulatory referral to Orthopedic Surgery      Follow up plan: Return in about 4 weeks (around 07/21/2016) for follow-up visit with Dr. Sherie Don.  An after-visit summary was printed and given to the patient at check-out.  Please see the patient instructions which may contain other information and recommendations beyond what is mentioned above in the assessment and plan.  Meds ordered this encounter  Medications  . Saw Palmetto, Serenoa repens, 1000 MG CAPS    Sig: Take 1,200 mg by mouth daily.  [name] acetaminophen (TYLENOL) 500 MG tablet    Sig: Take 500 mg by mouth every 8 (eight) hours as needed.  . busPIRone (BUSPAR) 5 MG tablet    Sig: Take 5 mg by mouth 2 (two) times daily as needed.  . furosemide (LASIX)  20 MG tablet    Sig: Take 1 tablet (20 mg total) by mouth daily.    Dispense:  30 tablet    Refill:  0  . potassium chloride (KLOR-CON 10) 10 MEQ tablet    Sig: Take 1 tablet (10 mEq total) by mouth daily.    Dispense:  30 tablet    Refill:  0    Orders Placed This Encounter  Procedures  . Ambulatory referral to Podiatry  . Ambulatory referral to Orthopedic Surgery

## 2016-06-24 ENCOUNTER — Telehealth: Payer: Self-pay

## 2016-06-24 NOTE — Assessment & Plan Note (Signed)
Check potassium and creatinine on diuretic

## 2016-06-24 NOTE — Assessment & Plan Note (Signed)
Managed by cardiologist; however, the patient has not been seen there in quite some time; I will extend his furosemide plus potassium, but really want to get him in for medical management (ACE-I, ARB, beta-blocker, etc per cardiologist's direction); avoid salt

## 2016-06-24 NOTE — Telephone Encounter (Signed)
-----   Message from Kerman PasseyMelinda P [name], MD sent at 06/24/2016 12:56 PM EDT ----- Please let the patient know that his kidney function has declined just a little bit on the fluid pills. Avoid red meat and processed foods and stay hydrated. I really really want him to get in to see Dr. Darrold JunkerParaschos about his heart failure ASAP. Please call Dr. Darrold JunkerParaschos' office personally, explain that he has CHF and needs an appt for evaluation and medication management ASAP and try to facilitate that. He's not on an ACE-I or beta-blocker or ARB and I really think Dr. Darrold JunkerParaschos would want him on one or both, you can explain. In the meantime, we'll continue the fluid pills and potassium like we're doing and respectfully let the heart doctor manage the heart failure going forward. Thank you.

## 2016-06-24 NOTE — Assessment & Plan Note (Signed)
I have patient back on aspirin; will try to get him back in to see his cardiologist

## 2016-06-24 NOTE — Assessment & Plan Note (Signed)
Trying to get patient back in to see his cardiologist

## 2016-06-24 NOTE — Telephone Encounter (Signed)
Left a detail voicemail regarding pt lab results and Dr. lada concerns with CHF. Also called Dr. Cassie Freerparachos office and left a message with his nurse to see if they can get him an appointment. She did state pt missed his lab Appts. and they tried contacting him and not able to get in contact with him.

## 2016-06-24 NOTE — Assessment & Plan Note (Signed)
Patient was referred to endocrinologist for this concern

## 2016-06-24 NOTE — Assessment & Plan Note (Signed)
Patient will be seeing heme-onc for the neuropathy; I suspect other etiology besides the diabetes, such as paraproteinemia

## 2016-06-28 ENCOUNTER — Ambulatory Visit
Admission: RE | Admit: 2016-06-28 | Discharge: 2016-06-28 | Disposition: A | Discharge status: Home / Self Care | Payer: Medicare Other | Source: Ambulatory Visit | Attending: Family Medicine | Admitting: Family Medicine

## 2016-06-28 ENCOUNTER — Encounter: Payer: Self-pay | Admitting: Family Medicine

## 2016-06-28 ENCOUNTER — Ambulatory Visit (INDEPENDENT_AMBULATORY_CARE_PROVIDER_SITE_OTHER): Payer: Medicare Other | Admitting: Family Medicine

## 2016-06-28 ENCOUNTER — Inpatient Hospital Stay: Payer: Self-pay | Attending: Oncology | Admitting: Oncology

## 2016-06-28 VITALS — BP 128/78 | HR 75 | Temp 97.5°F | Resp 14 | Wt 205.0 lb

## 2016-06-28 DIAGNOSIS — M25531 Pain in right wrist: Secondary | ICD-10-CM | POA: Diagnosis present

## 2016-06-28 DIAGNOSIS — I5022 Chronic systolic (congestive) heart failure: Secondary | ICD-10-CM

## 2016-06-28 DIAGNOSIS — M79641 Pain in right hand: Secondary | ICD-10-CM

## 2016-06-28 DIAGNOSIS — M545 Low back pain, unspecified: Secondary | ICD-10-CM

## 2016-06-28 DIAGNOSIS — M5136 Other intervertebral disc degeneration, lumbar region: Secondary | ICD-10-CM | POA: Diagnosis not present

## 2016-06-28 DIAGNOSIS — I700 Atherosclerosis of aorta: Secondary | ICD-10-CM | POA: Insufficient documentation

## 2016-06-28 DIAGNOSIS — I739 Peripheral vascular disease, unspecified: Secondary | ICD-10-CM | POA: Insufficient documentation

## 2016-06-28 NOTE — Patient Instructions (Addendum)
Please do pick up your fluid pills and potassium Do see the specialists (heart doctor and foot doctor) Please have xrays done across the street at the radiology building Try plain Tylenol (acetaminophen) per package directions for pain

## 2016-06-28 NOTE — Progress Notes (Signed)
BP 128/78   Pulse 75   Temp 97.5 F (36.4 C) (Oral)   Resp 14   Wt 205 lb (93 kg)   SpO2 98%   BMI 28.59 kg/m    Subjective:    Patient ID: [name], male    DOB: 03/11/1934, 82 y.o.   MRN: [id number]  HPI: Kenneth Gingerrthur E Alcala is a 82 y.o. male  Chief Complaint  Patient presents with  . Back Pain    HPI Patient fell about 2 weeks ago and fell onto outstretched right hand He is still having swelling and pain in the right wrist and hand Back hurts too, lower back, center and right of midline No b/b dysfunction No leg weakness  Depression screen [facility] 2/9 05/11/2016 04/30/2016 12/19/2015 07/18/2015  Decreased Interest 0 0 0 0  Down, Depressed, Hopeless 0 0 0 0  PHQ - 2 Score 0 0 0 0    Relevant past medical, surgical, family and social history reviewed Past Medical History:  Diagnosis Date  . Arthritis   . CAD (coronary artery disease)   . Congestive heart failure (CHF) (HCC) 06/14/2016   LVEF 40-45% 2016 echo; Dr. Darrold JunkerParaschos  . COPD (chronic obstructive pulmonary disease) (HCC)   . Diabetes mellitus without complication (HCC)   . Family history of adverse reaction to anesthesia    mom seizures   . GERD (gastroesophageal reflux disease)   . History of blood clots in legs   . History of ischemic cardiomyopathy   . 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 Hyperlipidemia   . Hypertension   . Literacy level of illiterate   . Osteomyelitis (HCC)   . PN (peripheral neuropathy)    Past Surgical History:  Procedure Laterality Date  . amputation 4th finger Left   . APPENDECTOMY    . CLAVICLE SURGERY  2012   open reduction and internal fixation of left clavicle  . CORONARY ARTERY BYPASS GRAFT  2002  . FOOT SURGERY    . IMPLANTABLE CARDIOVERTER DEFIBRILLATOR (ICD) GENERATOR CHANGE N/A 12/03/2015   Procedure: ICD GENERATOR CHANGE;  Surgeon: Marcina MillardAlexander Paraschos, MD;  Location: [facility] ORS;  Service: Cardiovascular;  Laterality: N/A;  . PACEMAKER INSERTION     Social History     Social History  . Marital status: Divorced    Spouse name: N/A  . Number of children: N/A  . Years of education: N/A   Occupational History  . Not on file.   Social History Main Topics  . Smoking status: Never Smoker  . Smokeless tobacco: Never Used  . Alcohol use No  . Drug use: No  . Sexual activity: Not on file   Other Topics Concern  . Not on file   Social History Narrative  . No narrative on file   Interim medical history since last visit reviewed. Allergies and medications reviewed  Review of Systems Per HPI unless specifically indicated above     Objective:    BP 128/78   Pulse 75   Temp 97.5 F (36.4 C) (Oral)   Resp 14   Wt 205 lb (93 kg)   SpO2 98%   BMI 28.59 kg/m   Wt Readings from Last 3 Encounters:  06/28/16 205 lb (93 kg)  06/23/16 207 lb 4.8 oz (94 kg)  06/14/16 202 lb 12.8 oz (92 kg)    Physical Exam  Constitutional: He appears well-developed and well-nourished. No distress.  Weight down 2-1/4 pounds in 5 days  HENT:  Head: Normocephalic and atraumatic.  Eyes:  No scleral icterus.  Neck: No JVD present.  Cardiovascular: Normal rate and regular rhythm.   Pulmonary/Chest: Effort normal and breath sounds normal. He has no rales.  Abdominal: A hernia (umbilical) is present.  Musculoskeletal: Edema: 1+ pitting edema of both lower extremities.       Right wrist: He exhibits decreased range of motion, tenderness and swelling.  Status post partial amputation finger left hand; mild swelling and tenderness of the radial aspect of the RIGHT wrist  Neurological: He displays no tremor.  Grip strength 5/5 bilaterally  Skin: He is not diaphoretic. No pallor.  Psychiatric: His mood appears not anxious. He does not exhibit a depressed mood.  Talkative but redirectable; fair historian      Assessment & Plan:   Problem List Items Addressed This Visit      Cardiovascular and Mediastinum   Congestive heart failure (CHF) (HCC)    Patient has lost a  little over two pounds in five days on the diuretic; I really would like him to get in to see his cardiologist; will try again       Other Visit Diagnoses    Acute wrist pain, right    -  Primary   s/p fall onto outstretched hand; doubt fracture, but will get xray because of patient's ongoing c/o pain   Relevant Orders   DG Wrist 2 Views Right (Completed)   Pain of right hand       patient continues to have pain after fall; will get xray to r/o fracture, but believe this is just sprained   Relevant Orders   DG Hand 2 View Right (Completed)   Acute bilateral low back pain without sciatica       patient continue to have discomfort in lower back; will get xray; r/o compression fracture   Relevant Orders   DG Lumbar Spine Complete (Completed)       Follow up plan: No Follow-up on file.  An after-visit summary was printed and given to the patient at check-out.  Please see the patient instructions which may contain other information and recommendations beyond what is mentioned above in the assessment and plan.  No orders of the defined types were placed in this encounter.   Orders Placed This Encounter  Procedures  . DG Wrist 2 Views Right  . DG Hand 2 View Right  . DG Lumbar Spine Complete

## 2016-06-28 NOTE — Progress Notes (Deleted)
Hematology/Oncology Consult note [facility] [phone] [phone]  Patient Care Team: Arnetha Courser, MD as PCP - General (Family Medicine) [place], [place] (Podiatry)   Name of the patient: [name]  [id number]  26-Apr-1934    Reason for referral- abnormal spep   Referring physician- Dr. Enid Derry  Date of visit: 06/28/16   History of presenting illness- patient is a 82 year old male with a past medical history significant for diabetes. More recently he has been having worsening tingling and numbness in his bilateral lower extremities despite good control of his diabetes. As a part of his workup he underwent SPEP showed a total protein was normal at 8.0. There was no abnormal monoclonal protein detected. Beta 2 globulin was mildly elevated at 0.7. Prior SPEP from June 2017 also did not reveal any monoclonal protein. Recent CBC from 04/19/2016 showed white count of 5.3, H&H of 9.0/40.9 with an MCV of 91.3 and a platelet count of 201. CMP revealed normal kidney functions with a BUN of 8/1.06 and a calcium of 9.5. Total protein was mildly elevated at 8.3. Prior urinalysis from October 2017 did not reveal any proteinuria. Most recent TSH from 04/30/2016 was low at 0.27  ECOG PS- ***  Pain scale- ***   Review of systems- Review of Systems  Constitutional: Negative for chills, fever, malaise/fatigue and weight loss.  HENT: Negative for congestion, ear discharge and nosebleeds.   Eyes: Negative for blurred vision.  Respiratory: Negative for cough, hemoptysis, sputum production, shortness of breath and wheezing.   Cardiovascular: Negative for chest pain, palpitations, orthopnea and claudication.  Gastrointestinal: Negative for abdominal pain, blood in stool, constipation, diarrhea, heartburn, melena, nausea and vomiting.  Genitourinary: Negative for dysuria, flank pain, frequency, hematuria and urgency.  Musculoskeletal: Negative for  back pain, joint pain and myalgias.  Skin: Negative for rash.  Neurological: Negative for dizziness, tingling, focal weakness, seizures, weakness and headaches.  Endo/Heme/Allergies: Does not bruise/bleed easily.  Psychiatric/Behavioral: Negative for depression and suicidal ideas. The patient does not have insomnia.     Allergies  Allergen Reactions  . Oxycodone-Acetaminophen Other (See Comments)    Causes the pt to be very irritable, says he can take this  . Percocet [Oxycodone-Acetaminophen] Other (See Comments)    Reaction:  Irritability     Patient Active Problem List   Diagnosis Date Noted  . Congestive heart failure (CHF) ([place]) 06/14/2016  . Medication monitoring encounter 06/14/2016  . Hyperthyroidism 12/04/2015  . Unsteady gait 12/04/2015  . 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 Abnormal serum protein electrophoresis 12/05/2014  . Pressure ulcer 11/15/2014  . Hyperproteinemia 11/02/2014  . Hypoalbuminemia 11/02/2014  . Arthritis 10/23/2014  . Arteriosclerosis of coronary artery 10/23/2014  . Diabetic foot ulcer associated with type 2 diabetes mellitus ([place]) 10/23/2014  . Amputation of finger of left hand 10/23/2014  . History of pulmonary embolism 10/23/2014  . Literacy problem 10/23/2014  . Umbilical hernia without obstruction or gangrene 10/23/2014  . Vitamin B12 deficiency 10/23/2014  . Neuropathy of lower extremity 10/23/2014  . History of osteomyelitis 10/23/2014  . Artificial cardiac pacemaker 08/10/2013  . H/O coronary artery bypass surgery 08/10/2013  . Benign prostatic hyperplasia with urinary obstruction 09/27/2012  . Abnormal prostate specific antigen 09/27/2012     Past Medical History:  Diagnosis Date  . Arthritis   . CAD (coronary artery disease)   . Congestive heart failure (CHF) (  HCC) 06/14/2016   LVEF 40-45% 2016 echo; Dr.  Darrold Junker  . COPD (chronic obstructive pulmonary disease) (HCC)   . Diabetes mellitus without complication (HCC)   . Family history of adverse reaction to anesthesia    mom seizures   . GERD (gastroesophageal reflux disease)   . History of blood clots in legs   . History of ischemic cardiomyopathy   . 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 Hyperlipidemia   . Hypertension   . Literacy level of illiterate   . Osteomyelitis (HCC)   . PN (peripheral neuropathy)      Past Surgical History:  Procedure Laterality Date  . amputation 4th finger Left   . APPENDECTOMY    . CLAVICLE SURGERY  2012   open reduction and internal fixation of left clavicle  . CORONARY ARTERY BYPASS GRAFT  2002  . FOOT SURGERY    . IMPLANTABLE CARDIOVERTER DEFIBRILLATOR (ICD) GENERATOR CHANGE N/A 12/03/2015   Procedure: ICD GENERATOR CHANGE;  Surgeon: Marcina Millard, MD;  Location: [facility] ORS;  Service: Cardiovascular;  Laterality: N/A;  . PACEMAKER INSERTION      Social History   Social History  . Marital status: Divorced    Spouse name: N/A  . Number of children: N/A  . Years of education: N/A   Occupational History  . Not on file.   Social History Main Topics  . Smoking status: Never Smoker  . Smokeless tobacco: Never Used  . Alcohol use No  . Drug use: No  . Sexual activity: Not on file   Other Topics Concern  . Not on file   Social History Narrative  . No narrative on file     Family History  Problem Relation Age of Onset  . Cancer Mother        spine  . Hypertension Mother   . Mental illness Mother   . Cancer Father        lung  . Asthma Father   . Allergic rhinitis Father   . Arthritis Father      Current Outpatient Prescriptions:  .  acetaminophen (TYLENOL) 500 MG tablet, Take 500 mg by mouth every 8 (eight) hours as needed., Disp: , Rfl:  .  aspirin EC 81 MG tablet, Take 1 tablet (81 mg total) by mouth daily., Disp: 30 tablet, Rfl: 11 .  atorvastatin (LIPITOR) 40 MG  tablet, Take 1 tablet (40 mg total) by mouth at bedtime. (Patient not taking: Reported on 06/23/2016), Disp: 90 tablet, Rfl: 1 .  busPIRone (BUSPAR) 5 MG tablet, Take 5 mg by mouth 2 (two) times daily as needed., Disp: , Rfl:  .  cholecalciferol (VITAMIN D) 1000 units tablet, Take 1,000 Units by mouth 2 (two) times daily., Disp: , Rfl:  .  furosemide (LASIX) 20 MG tablet, Take 1 tablet (20 mg total) by mouth daily., Disp: 30 tablet, Rfl: 0 .  gabapentin (NEURONTIN) 300 MG capsule, Take 1 capsule (300 mg total) by mouth 3 (three) times daily., Disp: 90 capsule, Rfl: 5 .  omeprazole (PRILOSEC) 40 MG capsule, Take 1 capsule (40 mg total) by mouth daily., Disp: 30 capsule, Rfl: 1 .  potassium chloride (KLOR-CON 10) 10 MEQ tablet, Take 1 tablet (10 mEq total) by mouth daily., Disp: 30 tablet, Rfl: 0 .  Saw [place]metto, Serenoa repens, 1000 MG CAPS, Take 1,200 mg by mouth daily., Disp: , Rfl:  .  vitamin B-12 (CYANOCOBALAMIN) 500 MCG tablet, Take 500 mcg by mouth daily., Disp: , Rfl:    Physical exam: There  were no vitals filed for this visit. Physical Exam  Constitutional: He is oriented to person, place, and time and well-developed, well-nourished, and in no distress.  HENT:  Head: Normocephalic and atraumatic.  Eyes: EOM are normal. Pupils are equal, round, and reactive to light.  Neck: Normal range of motion.  Cardiovascular: Normal rate, regular rhythm and normal heart sounds.   Pulmonary/Chest: Effort normal and breath sounds normal.  Abdominal: Soft. Bowel sounds are normal.  Neurological: He is alert and oriented to person, place, and time.  Skin: Skin is warm and dry.       CMP Latest Ref Rng & Units 06/23/2016  Glucose 65 - 99 mg/dL 104(H)  BUN 7 - 25 mg/dL 16  Creatinine 0.70 - 1.18 mg/dL 1.23(H)  Sodium 135 - 146 mmol/L 137  Potassium 3.5 - 5.3 mmol/L 4.2  Chloride 98 - 110 mmol/L 106  CO2 20 - 31 mmol/L 22  Calcium 8.6 - 10.3 mg/dL 9.6  Total Protein 6.5 - 8.1 g/dL -  Total  Bilirubin 0.3 - 1.2 mg/dL -  Alkaline Phos 38 - 126 U/L -  AST 15 - 41 U/L -  ALT 17 - 63 U/L -   CBC Latest Ref Rng & Units 04/19/2016  WBC 3.8 - 10.6 K/uL 5.3  Hemoglobin 13.0 - 18.0 g/dL 14.0  Hematocrit 40.0 - 52.0 % 40.9  Platelets 150 - 440 K/uL 201     Assessment and plan- Patient is a 82 y.o. male referred for abnormal spep  On reviewing his most recent as well as prior spep there was no evidence of monclonal protein in his serum. Mildly elevated beta 2 globulin is non specific and can be seen in inflmmation as well and as long as there is no monoclonal protein in serum this requires no further work up. Also there is no evidence of anemia, renal failure or hypercalcemia. Suspicion for underlying MGUS or multiple myeloma is very low. I will repeat cbc, cmp, serum free light chains, myeloma panel and random urine protein electropehersis and urine IFE. I will see the patient back in 2 weeks time.    Thank you for this kind referral and the opportunity to participate in the care of this patient   Visit Diagnosis 1. Abnormal SPEP     Dr. Randa Evens, MD, MPH [facility] Pager- [phone] 06/28/2016

## 2016-07-02 NOTE — Assessment & Plan Note (Signed)
Patient has lost a little over two pounds in five days on the diuretic; I really would like him to get in to see his cardiologist; will try again

## 2016-07-08 ENCOUNTER — Ambulatory Visit (INDEPENDENT_AMBULATORY_CARE_PROVIDER_SITE_OTHER): Payer: Medicare Other | Admitting: Family Medicine

## 2016-07-08 ENCOUNTER — Encounter: Payer: Self-pay | Admitting: Family Medicine

## 2016-07-08 VITALS — BP 124/76 | HR 89 | Temp 98.0°F | Resp 14 | Wt 197.2 lb

## 2016-07-08 DIAGNOSIS — L89892 Pressure ulcer of other site, stage 2: Secondary | ICD-10-CM | POA: Diagnosis not present

## 2016-07-08 DIAGNOSIS — E11621 Type 2 diabetes mellitus with foot ulcer: Secondary | ICD-10-CM | POA: Diagnosis not present

## 2016-07-08 DIAGNOSIS — L03119 Cellulitis of unspecified part of limb: Secondary | ICD-10-CM

## 2016-07-08 DIAGNOSIS — L97402 Non-pressure chronic ulcer of unspecified heel and midfoot with fat layer exposed: Secondary | ICD-10-CM | POA: Diagnosis not present

## 2016-07-08 DIAGNOSIS — I5022 Chronic systolic (congestive) heart failure: Secondary | ICD-10-CM

## 2016-07-08 DIAGNOSIS — L02619 Cutaneous abscess of unspecified foot: Secondary | ICD-10-CM | POA: Diagnosis not present

## 2016-07-08 DIAGNOSIS — Z5181 Encounter for therapeutic drug level monitoring: Secondary | ICD-10-CM

## 2016-07-08 LAB — CBC WITH DIFFERENTIAL/PLATELET
BASOS ABS: 0 {cells}/uL (ref 0–200)
Basophils Relative: 0 %
Eosinophils Absolute: 240 cells/uL (ref 15–500)
Eosinophils Relative: 3 %
HEMATOCRIT: 38.3 % — AB (ref 38.5–50.0)
HEMOGLOBIN: 13.2 g/dL (ref 13.2–17.1)
LYMPHS ABS: 1360 {cells}/uL (ref 850–3900)
Lymphocytes Relative: 17 %
MCH: 31.8 pg (ref 27.0–33.0)
MCHC: 34.5 g/dL (ref 32.0–36.0)
MCV: 92.3 fL (ref 80.0–100.0)
MONO ABS: 640 {cells}/uL (ref 200–950)
MPV: 9.9 fL (ref 7.5–12.5)
Monocytes Relative: 8 %
NEUTROS PCT: 72 %
Neutro Abs: 5760 cells/uL (ref 1500–7800)
Platelets: 253 10*3/uL (ref 140–400)
RBC: 4.15 MIL/uL — AB (ref 4.20–5.80)
RDW: 14.5 % (ref 11.0–15.0)
WBC: 8 10*3/uL (ref 3.8–10.8)

## 2016-07-08 LAB — BASIC METABOLIC PANEL
BUN: 12 mg/dL (ref 7–25)
CO2: 22 mmol/L (ref 20–31)
Calcium: 9.4 mg/dL (ref 8.6–10.3)
Chloride: 107 mmol/L (ref 98–110)
Creat: 1.25 mg/dL — ABNORMAL HIGH (ref 0.70–1.18)
Glucose, Bld: 97 mg/dL (ref 65–99)
POTASSIUM: 4.2 mmol/L (ref 3.5–5.3)
Sodium: 138 mmol/L (ref 135–146)

## 2016-07-08 MED ORDER — CLINDAMYCIN HCL 300 MG PO CAPS: 300.0000 mg | ORAL_CAPSULE | Freq: Three times a day (TID) | ORAL | 0 refills | Status: AC

## 2016-07-08 NOTE — Assessment & Plan Note (Signed)
Continue diuretic; I personally talked with scheduler at Dr. Darrold JunkerParaschos office and asked if they will please see him, aware that he has had numerous no-shows; explained homelessness, no car, transportation issues, etc; they agreed to schedule him one more time and that was greatly appreciated; I talked to the patient and explained that it is very very important that he make this appointment

## 2016-07-08 NOTE — Patient Instructions (Addendum)
You have an appointment with the heart doctor on July 22, 2016 at 10:30 am Arrive by 10:15 am You will have your pacemaker interrogated first and then see Dr. Darrold Junker after that on the same day See your foot doctor next week Start the antibiotics (clindamycin) If your feet get worse, go to the emergency department Do everything you can to avoid pressure to the bottom of the feet; stay off of your feet as much as possible over the next week Keep your feet clean and dry Do not go barefoot in your home  Diabetes and Foot Care Diabetes may cause you to have problems because of poor blood supply (circulation) to your feet and legs. This may cause the skin on your feet to become thinner, break easier, and heal more slowly. Your skin may become dry, and the skin may peel and crack. You may also have nerve damage in your legs and feet causing decreased feeling in them. You may not notice minor injuries to your feet that could lead to infections or more serious problems. Taking care of your feet is one of the most important things you can do for yourself. Follow these instructions at home:  Wear shoes at all times, even in the house. Do not go barefoot. Bare feet are easily injured.  Check your feet daily for blisters, cuts, and redness. If you cannot see the bottom of your feet, use a mirror or ask someone for help.  Wash your feet with warm water (do not use hot water) and mild soap. Then pat your feet and the areas between your toes until they are completely dry. Do not soak your feet as this can dry your skin.  Apply a moisturizing lotion or petroleum jelly (that does not contain alcohol and is unscented) to the skin on your feet and to dry, brittle toenails. Do not apply lotion between your toes.  Trim your toenails straight across. Do not dig under them or around the cuticle. File the edges of your nails with an emery board or nail file.  Do not cut corns or calluses or try to remove them with  medicine.  Wear clean socks or stockings every day. Make sure they are not too tight. Do not wear knee-high stockings since they may decrease blood flow to your legs.  Wear shoes that fit properly and have enough cushioning. To break in new shoes, wear them for just a few hours a day. This prevents you from injuring your feet. Always look in your shoes before you put them on to be sure there are no objects inside.  Do not cross your legs. This may decrease the blood flow to your feet.  If you find a minor scrape, cut, or break in the skin on your feet, keep it and the skin around it clean and dry. These areas may be cleansed with mild soap and water. Do not cleanse the area with peroxide, alcohol, or iodine.  When you remove an adhesive bandage, be sure not to damage the skin around it.  If you have a wound, look at it several times a day to make sure it is healing.  Do not use heating pads or hot water bottles. They may burn your skin. If you have lost feeling in your feet or legs, you may not know it is happening until it is too late.  Make sure your health care provider performs a complete foot exam at least annually or more often if you have  foot problems. Report any cuts, sores, or bruises to your health care provider immediately. Contact a health care provider if:  You have an injury that is not healing.  You have cuts or breaks in the skin.  You have an ingrown nail.  You notice redness on your legs or feet.  You feel burning or tingling in your legs or feet.  You have pain or cramps in your legs and feet.  Your legs or feet are numb.  Your feet always feel cold. Get help right away if:  There is increasing redness, swelling, or pain in or around a wound.  There is a red line that goes up your leg.  Pus is coming from a wound.  You develop a fever or as directed by your health care provider.  You notice a bad smell coming from an ulcer or wound. This information is  not intended to replace advice given to you by your health care provider. Make sure you discuss any questions you have with your health care provider. Document Released: 01/30/2000 Document Revised: 07/10/2015 Document Reviewed: 07/11/2012 Elsevier Interactive Patient Education  2017 [employer]

## 2016-07-08 NOTE — Assessment & Plan Note (Signed)
Refer to wound care center; so appreciate them seeing patient tomorrow since foot doctor cannot see him for one week; start clindamycin

## 2016-07-08 NOTE — Progress Notes (Signed)
BP 124/76   Pulse 89   Temp 98 F (36.7 C) (Oral)   Resp 14   Wt 197 lb 3.2 oz (89.4 kg)   SpO2 98%   BMI 27.50 kg/m    Subjective:    Patient ID: [name], male    DOB: [birth year], 82 y.o.   MRN: [id number]  HPI: Kenneth Gingerrthur E Fredenburg is a 82 y.o. male  Chief Complaint  Patient presents with  . Foot Pain    bilateral   HPI Patient is here for an acute visit, c/o bilateral foot pain He has a history of diabetic foot ulcers along with dense neuropathy He has not been wearing socks lately; just tennis shoes No fevers, just a little hot; no red streaks up the legs He has not seen the cardiologist yet; he has a pacemaker and heart failure; he is feeling better on the fluid pills  [facility] 05/11/2016 04/30/2016 12/19/2015 07/18/2015  Decreased Interest 0 0 0 0  Down, Depressed, Hopeless 0 0 0 0  PHQ - 2 Score 0 0 0 0    Relevant past medical, surgical, family and social history reviewed Past Medical History:  Diagnosis Date  . Arthritis   . CAD (coronary artery disease)   . Congestive heart failure (CHF) (HCC) 06/14/2016   LVEF 40-45% 2016 echo; Dr. Darrold JunkerParaschos  . COPD (chronic obstructive pulmonary disease) (HCC)   . Diabetes mellitus without complication (HCC)   . Family history of adverse reaction to anesthesia    mom seizures   . GERD (gastroesophageal reflux disease)   . History of blood clots in legs   . History of ischemic cardiomyopathy   . 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 Hyperlipidemia   . Hypertension   . Literacy level of illiterate   . Osteomyelitis (HCC)   . PN (peripheral neuropathy)    Past Surgical History:  Procedure Laterality Date  . amputation 4th finger Left   . APPENDECTOMY    . CLAVICLE SURGERY  2012   open reduction and internal fixation of left clavicle  . CORONARY ARTERY BYPASS GRAFT  2002  . FOOT SURGERY    . IMPLANTABLE CARDIOVERTER DEFIBRILLATOR (ICD) GENERATOR CHANGE N/A 12/03/2015   Procedure: ICD GENERATOR  CHANGE;  Surgeon: Marcina MillardAlexander Paraschos, MD;  Location: [facility] ORS;  Service: Cardiovascular;  Laterality: N/A;  . PACEMAKER INSERTION     Family History  Problem Relation Age of Onset  . Cancer Mother        spine  . Hypertension Mother   . Mental illness Mother   . Cancer Father        lung  . Asthma Father   . Allergic rhinitis Father   . Arthritis Father    Social History   Social History  . Marital status: Divorced    Spouse name: N/A  . Number of children: N/A  . Years of education: N/A   Occupational History  . Not on file.   Social History Main Topics  . Smoking status: Never Smoker  . Smokeless tobacco: Never Used  . Alcohol use No  . Drug use: No  . Sexual activity: Not on file   Other Topics Concern  . Not on file   Social History Narrative  . No narrative on file    Interim medical history since last visit reviewed. Allergies and medications reviewed  Review of Systems Per HPI unless specifically indicated above     Objective:    BP 124/76  Pulse 89   Temp 98 F (36.7 C) (Oral)   Resp 14   Wt 197 lb 3.2 oz (89.4 kg)   SpO2 98%   BMI 27.50 kg/m   Wt Readings from Last 3 Encounters:  07/08/16 197 lb 3.2 oz (89.4 kg)  06/28/16 205 lb (93 kg)  06/23/16 207 lb 4.8 oz (94 kg)    Physical Exam  Constitutional: He appears well-developed and well-nourished. No distress.  Weight down 10 pounds over last 15 days back on diuretic therapy  Neck: No JVD present.  Cardiovascular: Normal rate and regular rhythm.   Pulmonary/Chest: Effort normal and breath sounds normal.  Abdominal: He exhibits no distension.  Musculoskeletal: He exhibits edema (1+ pitting edema).  Neurological:  Dense neuropathy of both lower extremities almost to the knees  Psychiatric: He has a normal mood and affect.   Diabetic Foot Form - Detailed   Diabetic Foot Exam - detailed Diabetic Foot exam was performed with the following findings:  Yes 07/08/2016  4:09 PM  Visual Foot  Exam completed.:  Yes  Is there a history of foot ulcer?:  Yes Right Dorsalis Pedis:  Diminished Left Dorsalis Pedis:  Diminished  Swelling:  Yes Semmes-Weinstein Monofilament Test R Site 1-Great Toe:  Neg L Site 1-Great Toe:  Neg  R Site 4:  Neg L Site 4:  Neg  R Site 5:  Neg L Site 5:  Neg    Comments:  Large tunneling ulcer and blister LEFT foot over ball extending to medial foot near base of toe; with light pressure applied to blistered area, significant cloudy/sanguinous drainage came through ulcerated opening at ball of foot; on the RIGHT foot, there are two ulcers, one about nickel-sized and one about dime-sized    Results for orders placed or performed in visit on 06/23/16  Basic Metabolic Panel (BMET)  Result Value Ref Range   Sodium 137 135 - 146 mmol/L   Potassium 4.2 3.5 - 5.3 mmol/L   Chloride 106 98 - 110 mmol/L   CO2 22 20 - 31 mmol/L   Glucose, Bld 104 (H) 65 - 99 mg/dL   BUN 16 7 - 25 mg/dL   Creat 4.54 (H) 0.98 - 1.18 mg/dL   Calcium 9.6 8.6 - 11.9 mg/dL      Assessment & Plan:   Problem List Items Addressed This Visit      Cardiovascular and Mediastinum   Congestive heart failure (CHF) (HCC)    Continue diuretic; I personally talked with scheduler at Dr. Darrold Junker office and asked if they will please see him, aware that he has had numerous no-shows; explained homelessness, no car, transportation issues, etc; they agreed to schedule him one more time and that was greatly appreciated; I talked to the patient and explained that it is very very important that he make this appointment        Endocrine   Diabetic foot ulcers (HCC) - Primary    Culture obtained from the       Relevant Orders   Wound culture   Basic Metabolic Panel (BMET)     Musculoskeletal and Integument   Pressure ulcer    Refer to wound care center; so appreciate them seeing patient tomorrow since foot doctor cannot see him for one week; start clindamycin        Other   Medication  monitoring encounter    Check creatinine on the fluid pills      Relevant Orders   Basic Metabolic Panel (BMET)  Other Visit Diagnoses    Cellulitis and abscess of foot       foul odor of the discharge coming out of the wound on teh LEFT foot; start clindamycin; culture obtained; wound center will see him tomorrow   Relevant Orders   CBC with Differential/Platelet       Follow up plan: Return in about 5 days (around 07/13/2016) for follow-up visit with Dr. Sherie Don.  An after-visit summary was printed and given to the patient at check-out.  Please see the patient instructions which may contain other information and recommendations beyond what is mentioned above in the assessment and plan.  Meds ordered this encounter  Medications  . clindamycin (CLEOCIN) 300 MG capsule    Sig: Take 1 capsule (300 mg total) by mouth 3 (three) times daily.    Dispense:  21 capsule    Refill:  0    Orders Placed This Encounter  Procedures  . Wound culture  . CBC with Differential/Platelet  . Basic Metabolic Panel (BMET)

## 2016-07-08 NOTE — Assessment & Plan Note (Signed)
Check creatinine on the fluid pills

## 2016-07-08 NOTE — Assessment & Plan Note (Signed)
Culture obtained from the

## 2016-07-09 ENCOUNTER — Encounter: Payer: Medicare Other | Attending: Surgery | Admitting: Surgery

## 2016-07-09 DIAGNOSIS — I132 Hypertensive heart and chronic kidney disease with heart failure and with stage 5 chronic kidney disease, or end stage renal disease: Secondary | ICD-10-CM | POA: Diagnosis not present

## 2016-07-09 DIAGNOSIS — L97522 Non-pressure chronic ulcer of other part of left foot with fat layer exposed: Secondary | ICD-10-CM | POA: Insufficient documentation

## 2016-07-09 DIAGNOSIS — I2510 Atherosclerotic heart disease of native coronary artery without angina pectoris: Secondary | ICD-10-CM | POA: Insufficient documentation

## 2016-07-09 DIAGNOSIS — J449 Chronic obstructive pulmonary disease, unspecified: Secondary | ICD-10-CM | POA: Insufficient documentation

## 2016-07-09 DIAGNOSIS — Z950 Presence of cardiac pacemaker: Secondary | ICD-10-CM | POA: Diagnosis not present

## 2016-07-09 DIAGNOSIS — K219 Gastro-esophageal reflux disease without esophagitis: Secondary | ICD-10-CM | POA: Insufficient documentation

## 2016-07-09 DIAGNOSIS — L97512 Non-pressure chronic ulcer of other part of right foot with fat layer exposed: Secondary | ICD-10-CM | POA: Insufficient documentation

## 2016-07-09 DIAGNOSIS — E11621 Type 2 diabetes mellitus with foot ulcer: Secondary | ICD-10-CM | POA: Diagnosis present

## 2016-07-09 DIAGNOSIS — I509 Heart failure, unspecified: Secondary | ICD-10-CM | POA: Insufficient documentation

## 2016-07-09 DIAGNOSIS — I255 Ischemic cardiomyopathy: Secondary | ICD-10-CM | POA: Insufficient documentation

## 2016-07-09 DIAGNOSIS — Z79899 Other long term (current) drug therapy: Secondary | ICD-10-CM | POA: Diagnosis not present

## 2016-07-09 DIAGNOSIS — E1122 Type 2 diabetes mellitus with diabetic chronic kidney disease: Secondary | ICD-10-CM | POA: Insufficient documentation

## 2016-07-09 DIAGNOSIS — N186 End stage renal disease: Secondary | ICD-10-CM | POA: Diagnosis not present

## 2016-07-09 DIAGNOSIS — Z7982 Long term (current) use of aspirin: Secondary | ICD-10-CM | POA: Diagnosis not present

## 2016-07-09 DIAGNOSIS — Z885 Allergy status to narcotic agent status: Secondary | ICD-10-CM | POA: Insufficient documentation

## 2016-07-09 DIAGNOSIS — E785 Hyperlipidemia, unspecified: Secondary | ICD-10-CM | POA: Diagnosis not present

## 2016-07-09 DIAGNOSIS — Z85038 Personal history of other malignant neoplasm of large intestine: Secondary | ICD-10-CM | POA: Diagnosis not present

## 2016-07-09 DIAGNOSIS — Z951 Presence of aortocoronary bypass graft: Secondary | ICD-10-CM | POA: Diagnosis not present

## 2016-07-10 NOTE — Progress Notes (Addendum)
TRIGO, WINTERBOTTOM ([id number]) Visit Report for 07/09/2016 Chief Complaint Document Details Patient Name: [name], [name] Date of Service: 07/09/2016 10:30 AM Medical Record Number: [id number] Patient Account Number: [id number] Date of Birth/Sex: 1935-02-14 (82 y.o. Male) Treating RN: Phillis Haggis Primary Care Provider: Baruch Gouty Other Clinician: Referring Provider: Baruch Gouty Treating Provider/Extender: Rudene Re in Treatment: 0 Information Obtained from: Patient Chief Complaint This 82 year old patient is a very poor historian and has no family member with him. He has ulcers both feet which are present for about 2 weeks as per his history but I believe there have been callouses is for longer than that Electronic Signature(s) Signed: 07/09/2016 12:02:35 PM By: Evlyn Kanner MD, FACS Entered By: Evlyn Kanner on 07/09/2016 12:02:34 Ratay, Irma Newness ([id number]) -------------------------------------------------------------------------------- Debridement Details Patient Name: [name] Date of Service: 07/09/2016 10:30 AM Medical Record Number: [id number] Patient Account Number: [id number] Date of Birth/Sex: [birth year] (81 y.o. Male) Treating RN: Huel Coventry Primary Care Provider: Baruch Gouty Other Clinician: Referring Provider: Baruch Gouty Treating Provider/Extender: Rudene Re in Treatment: 0 Debridement Performed for Wound #4 Right,Plantar Metatarsal head first Assessment: Performed By: Physician Evlyn Kanner, MD Debridement: Debridement Severity of Tissue Pre Fat layer exposed Debridement: Pre-procedure Verification/Time Out Yes - 11:32 Taken: Start Time: 11:33 Pain Control: Lidocaine 4% Topical Solution Level: Skin/Subcutaneous Tissue Total Area Debrided (L x 0.5 (cm) x 0.3 (cm) = 0.15 (cm) W): Tissue and other Viable, Non-Viable, Fibrin/Slough, Skin, Subcutaneous material debrided: Instrument: Forceps,  Scissors Bleeding: Minimum Hemostasis Achieved: Pressure End Time: 11:36 Procedural Pain: 0 Post Procedural Pain: 0 Response to Treatment: Procedure was tolerated well Post Debridement Measurements of Total Wound Length: (cm) 2.5 Width: (cm) 1 Depth: (cm) 0.3 Volume: (cm) 0.589 Character of Wound/Ulcer Post Requires Further Debridement Debridement: Severity of Tissue Post Debridement: Fat layer exposed Post Procedure Diagnosis Same as Pre-procedure Electronic Signature(s) Signed: 07/14/2016 9:26:08 AM By: Elliot Gurney, BSN, RN, CWS, Kim RN, BSN Signed: 07/15/2016 4:34:04 PM By: Evlyn Kanner MD, FACS SIYON, LINCK ([id number]) Previous Signature: 07/09/2016 12:01:24 PM Version By: Evlyn Kanner MD, FACS Entered By: Elliot Gurney, BSN, RN, CWS, Kim on 07/14/2016 09:24:19 Allers, Irma Newness ([id number]) -------------------------------------------------------------------------------- Debridement Details Patient Name: [name] Date of Service: 07/09/2016 10:30 AM Medical Record Number: [id number] Patient Account Number: [id number] Date of Birth/Sex: [birth year] (82 y.o. Male) Treating RN: Huel Coventry Primary Care Provider: Baruch Gouty Other Clinician: Referring Provider: Baruch Gouty Treating Provider/Extender: Rudene Re in Treatment: 0 Debridement Performed for Wound #5 Right,Plantar Metatarsal head third Assessment: Performed By: Physician Evlyn Kanner, MD Debridement: Debridement Severity of Tissue Pre Fat layer exposed Debridement: Pre-procedure Verification/Time Out Yes - 11:32 Taken: Start Time: 11:33 Pain Control: Lidocaine 4% Topical Solution Level: Skin/Subcutaneous Tissue Total Area Debrided (L x 0.5 (cm) x 0.5 (cm) = 0.25 (cm) W): Tissue and other Viable, Non-Viable, Fibrin/Slough, Skin, Subcutaneous material debrided: Instrument: Forceps, Scissors Bleeding: Minimum Hemostasis Achieved: Pressure End Time: 11:36 Procedural Pain: 0 Post  Procedural Pain: 0 Response to Treatment: Procedure was tolerated well Post Debridement Measurements of Total Wound Length: (cm) 2 Width: (cm) 2.5 Depth: (cm) 0.2 Volume: (cm) 0.785 Character of Wound/Ulcer Post Requires Further Debridement Debridement: Severity of Tissue Post Debridement: Fat layer exposed Post Procedure Diagnosis Same as Pre-procedure Electronic Signature(s) Signed: 07/14/2016 9:26:08 AM By: Elliot Gurney, BSN, RN, CWS, Kim RN, BSN Signed: 07/15/2016 4:34:04 PM By: Evlyn Kanner MD, FACS DENI, LEFEVER ([id number]) Previous Signature: 07/09/2016 12:01:34 PM Version By: Evlyn Kanner MD, FACS Entered By: Elliot Gurney, BSN,  RN, CWS, Kim on 07/14/2016 62:13:08 OLYVER, HAWES ([id number]) -------------------------------------------------------------------------------- Debridement Details Patient Name: [name], [name] Date of Service: 07/09/2016 10:30 AM Medical Record Number: [id number] Patient Account Number: [id number] Date of Birth/Sex: 1935/01/04 (81 y.o. Male) Treating RN: Huel Coventry Primary Care Provider: Baruch Gouty Other Clinician: Referring Provider: Baruch Gouty Treating Provider/Extender: Rudene Re in Treatment: 0 Debridement Performed for Wound #6 Left,Plantar Metatarsal head fifth Assessment: Performed By: Physician Evlyn Kanner, MD Debridement: Debridement Severity of Tissue Pre Fat layer exposed Debridement: Pre-procedure Verification/Time Out Yes - 11:32 Taken: Start Time: 11:33 Pain Control: Lidocaine 4% Topical Solution Level: Skin/Subcutaneous Tissue Total Area Debrided (L x 0.5 (cm) x 0.6 (cm) = 0.3 (cm) W): Tissue and other Viable, Non-Viable, Fibrin/Slough, Skin, Subcutaneous material debrided: Instrument: Forceps, Scissors Bleeding: Minimum Hemostasis Achieved: Pressure End Time: 11:36 Procedural Pain: 0 Post Procedural Pain: 0 Response to Treatment: Procedure was tolerated well Post Debridement Measurements of  Total Wound Length: (cm) 3.5 Width: (cm) 4 Depth: (cm) 0.2 Volume: (cm) 2.199 Character of Wound/Ulcer Post Requires Further Debridement Debridement: Severity of Tissue Post Debridement: Fat layer exposed Post Procedure Diagnosis Same as Pre-procedure Electronic Signature(s) Signed: 07/14/2016 9:26:08 AM By: Elliot Gurney, BSN, RN, CWS, Kim RN, BSN Signed: 07/15/2016 4:34:04 PM By: Evlyn Kanner MD, FACS SAYVION, VIGEN ([id number]) Previous Signature: 07/09/2016 12:01:41 PM Version By: Evlyn Kanner MD, FACS Entered By: Elliot Gurney, BSN, RN, CWS, Kim on 07/14/2016 09:24:37 Cail, Irma Newness ([id number]) -------------------------------------------------------------------------------- Debridement Details Patient Name: [name] Date of Service: 07/09/2016 10:30 AM Medical Record Number: [id number] Patient Account Number: [id number] Date of Birth/Sex: March 27, 1934 (81 y.o. Male) Treating RN: Huel Coventry Primary Care Provider: Baruch Gouty Other Clinician: Referring Provider: Baruch Gouty Treating Provider/Extender: Rudene Re in Treatment: 0 Debridement Performed for Wound #7 Right,Lateral Foot Assessment: Performed By: Physician Evlyn Kanner, MD Debridement: Debridement Severity of Tissue Pre Fat layer exposed Debridement: Pre-procedure Verification/Time Out Yes - 11:32 Taken: Start Time: 11:33 Pain Control: Lidocaine 4% Topical Solution Level: Skin/Subcutaneous Tissue Total Area Debrided (L x 1.5 (cm) x 1.2 (cm) = 1.8 (cm) W): Tissue and other Viable, Non-Viable, Skin, Subcutaneous material debrided: Instrument: Forceps, Scissors Bleeding: Minimum Hemostasis Achieved: Pressure End Time: 11:36 Procedural Pain: 0 Post Procedural Pain: 0 Response to Treatment: Procedure was tolerated well Post Debridement Measurements of Total Wound Length: (cm) 1.5 Width: (cm) 1.2 Depth: (cm) 0.3 Volume: (cm) 0.424 Character of Wound/Ulcer Post Requires Further  Debridement Debridement: Severity of Tissue Post Debridement: Fat layer exposed Post Procedure Diagnosis Same as Pre-procedure Electronic Signature(s) Signed: 07/14/2016 9:26:08 AM By: Elliot Gurney, BSN, RN, CWS, Kim RN, BSN Signed: 07/15/2016 4:34:04 PM By: Evlyn Kanner MD, FACS PAVLE, WILER ([id number]) Previous Signature: 07/09/2016 12:12:15 PM Version By: Elliot Gurney, BSN, RN, CWS, Kim RN, BSN Previous Signature: 07/09/2016 2:37:26 PM Version By: Evlyn Kanner MD, FACS Entered By: Elliot Gurney, BSN, RN, CWS, Kim on 07/14/2016 09:24:46 Yingling, Irma Newness ([id number]) -------------------------------------------------------------------------------- HPI Details Patient Name: [name] Date of Service: 07/09/2016 10:30 AM Medical Record Number: [id number] Patient Account Number: [id number] Date of Birth/Sex: Apr 09, 1934 (82 y.o. Male) Treating RN: Phillis Haggis Primary Care Provider: Baruch Gouty Other Clinician: Referring Provider: Baruch Gouty Treating Provider/Extender: Rudene Re in Treatment: 0 History of Present Illness Location: ulcers on both feet Quality: Patient reports No Pain. Severity: Patient states wound are getting worse. Duration: Patient states that they are not certain how long the wound has been present. Context: The wound appeared gradually over time Modifying Factors: Consults to this date  include: was seen by his PCP recently. Associated Signs and Symptoms: Patient reports having difficulty standing for long periods. HPI Description: 82 year old patient was recently seen by his PCP Dr. Baruch Gouty, who noted a large tunneling ulcer and blister on the left foot extending medially near the base of the toe. There were also 2 small ulcers on the right foot. his most recent hemoglobin A1c was 5.8 He has been placed on clindamycin recently He has been treated by her for congestive heart failure and was recently put on clindamycin for the abscesses and ulceration  of the foot. Past medical history has been significant for diabetes mellitus, coronary artery disease, congestive heart failure and ischemic cardiomyopathy with status post amputation of left fourth finger, appendectomy, coronary artery bypass graft, defibrillator placement. Never been a smoker. most recent laboratory investigations done showed WBC of 8000, hemoglobin of 13.2 hematocrit of 38.3 and platelets of 253 with a normal BMP. No recent x-rays have been done ==== Old notes: 82 year old gentleman with a past medical history of COPD, GERD, hyperlipidemia, hypertension, arthritis, peripheral neuropathy and diabetes mellitus was seen in the ER recently and worked up for bilateral feet ulceration. He was also seen recently by Dr. Baruch Gouty with whom he went to establish care in early September. He also sees a podiatrist Dr. Graciela Husbands and there is some question of whether he had osteomyelitis in the past. 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 Most recent x-rays done in the ER on 10/23/2014 did not show any evidence of osteomyelitis both feet. The patient does not check his blood sugar, does not take treatment regularly and is very difficult to understand. We will make a call to his daughter who lives in town and see if we can get some better history from her. 11/01/2014 -- he is here with his daughter today and she says she is going to help him see the podiatrist and get some appointments for surgical care. She also understands that he has to check his blood glucose regularly at home and control his diabetes. 11/08/2014 -- he was seen by Dr. Graciela Husbands the podiatrist who had x-rays done -- the right foot showed no clear Spong, Raymundo E. ([id number]) evidence of osteomyelitis but there was evidence of peripheral vascular disease. the left foot all show showed no clear evidence of osteomyelitis but there was  peripheral vascular disease. Dr. Alberteen Spindle had debrided tissue from the right foot and applied some local Bactroban and sterile bandage and debrided all the toenails and give him instruction to come back when necessary. ==== Electronic Signature(s) Signed: 07/09/2016 12:05:04 PM By: Evlyn Kanner MD, FACS Previous Signature: 07/09/2016 11:00:26 AM Version By: Evlyn Kanner MD, FACS Previous Signature: 07/09/2016 11:00:12 AM Version By: Evlyn Kanner MD, FACS Entered By: Evlyn Kanner on 07/09/2016 12:05:04 FORTUNEIrma Newness ([id number]) -------------------------------------------------------------------------------- Physical Exam Details Patient Name: [name] Date of Service: 07/09/2016 10:30 AM Medical Record Number: [id number] Patient Account Number: [id number] Date of Birth/Sex: November 11, 1934 (82 y.o. Male) Treating RN: Phillis Haggis Primary Care Provider: Baruch Gouty Other Clinician: Referring Provider: Baruch Gouty Treating Provider/Extender: Rudene Re in Treatment: 0 Constitutional . Pulse regular. Respirations normal and unlabored. Afebrile. . Eyes Nonicteric. Reactive to light. Ears, Nose, Mouth, and Throat Lips, teeth, and gums WNL.[name] Moist mucosa without lesions. Neck supple and nontender. No palpable supraclavicular or cervical adenopathy. Normal sized without goiter. Respiratory WNL. No retractions.. Cardiovascular Pedal Pulses WNL. ABI on  the left was 1.36 on the right was 1.27. No clubbing, cyanosis and has mild grade 1 lymphedema bilaterally.. Gastrointestinal (GI) Abdomen without masses or tenderness.. No liver or spleen enlargement or tenderness.. Lymphatic No adneopathy. No adenopathy. No adenopathy. Musculoskeletal Adexa without tenderness or enlargement.. Digits and nails w/o clubbing, cyanosis, infection, petechiae, ischemia, or inflammatory conditions.. Integumentary (Hair, Skin) No suspicious lesions. No crepitus or fluctuance. No  peri-wound warmth or erythema. No masses.[name] Psychiatric Judgement and insight Intact.. No evidence of depression, anxiety, or agitation.. Notes the patient has 3 superficial ulcerations on the right forefoot in the region of the first metatarsal head third metatarsal head and the fifth metatarsal head. All 3 were sharply debrided. On the right foot he has a fairly large ulcerated area which needed sharp debridement and this is on the first metatarsal head going towards the base of his left big toe. Minimal bleeding controlled with pressure Electronic Signature(s) Signed: 07/09/2016 12:07:05 PM By: Evlyn Kanner MD, FACS Entered By: Evlyn Kanner on 07/09/2016 12:07:05 FORTUNEIrma Newness ([id number]) -------------------------------------------------------------------------------- Physician Orders Details Patient Name: [name] Date of Service: 07/09/2016 10:30 AM Medical Record Number: [id number] Patient Account Number: [id number] Date of Birth/Sex: 1934-03-01 (81 y.o. Male) Treating RN: Huel Coventry Primary Care Provider: Baruch Gouty Other Clinician: Referring Provider: Baruch Gouty Treating Provider/Extender: Rudene Re in Treatment: 0 Verbal / Phone Orders: No Diagnosis Coding Wound Cleansing Wound #4 Right,Plantar Metatarsal head first o Clean wound with Normal Saline. o May Shower, gently pat wound dry prior to applying new dressing. Wound #5 Right,Plantar Metatarsal head third o Clean wound with Normal Saline. o May Shower, gently pat wound dry prior to applying new dressing. Wound #6 Left,Plantar Metatarsal head fifth o Clean wound with Normal Saline. o May Shower, gently pat wound dry prior to applying new dressing. Wound #7 Right,Lateral Foot o Clean wound with Normal Saline. o May Shower, gently pat wound dry prior to applying new dressing. Anesthetic Wound #4 Right,Plantar Metatarsal head first o Topical Lidocaine 4% cream applied to  wound bed prior to debridement Wound #5 Right,Plantar Metatarsal head third o Topical Lidocaine 4% cream applied to wound bed prior to debridement Wound #6 Left,Plantar Metatarsal head fifth o Topical Lidocaine 4% cream applied to wound bed prior to debridement Wound #7 Right,Lateral Foot o Topical Lidocaine 4% cream applied to wound bed prior to debridement Primary Wound Dressing Wound #4 Right,Plantar Metatarsal head first o Aquacel Ag Wound #5 Right,Plantar Metatarsal head third o Aquacel Ag KIYOSHI, SCHAAB ([id number]) Wound #6 Left,Plantar Metatarsal head fifth o Aquacel Ag Wound #7 Right,Lateral Foot o Aquacel Ag Secondary Dressing Wound #4 Right,Plantar Metatarsal head first o ABD pad o Conform/Kerlix Wound #5 Right,Plantar Metatarsal head third o ABD pad o Conform/Kerlix Wound #6 Left,Plantar Metatarsal head fifth o ABD pad o Conform/Kerlix Wound #7 Right,Lateral Foot o ABD pad o Conform/Kerlix Dressing Change Frequency Wound #4 Right,Plantar Metatarsal head first o Change Dressing Monday, Wednesday, Friday Wound #5 Right,Plantar Metatarsal head third o Change Dressing Monday, Wednesday, Friday Wound #6 Left,Plantar Metatarsal head fifth o Change Dressing Monday, Wednesday, Friday Wound #7 Right,Lateral Foot o Change Dressing Monday, Wednesday, Friday Follow-up Appointments Wound #4 Right,Plantar Metatarsal head first o Return Appointment in 1 week. Wound #5 Right,Plantar Metatarsal head third o Return Appointment in 1 week. Wound #6 Left,Plantar Metatarsal head fifth o Return Appointment in 1 week. Wound #7 Right,Lateral Foot Leisey, Quintrell E. ([id number]) o Return Appointment in 1 week. Edema Control Wound #4 Right,Plantar Metatarsal head  first o Elevate legs to the level of the heart and pump ankles as often as possible Wound #5 Right,Plantar Metatarsal head third o Elevate legs to the level of the  heart and pump ankles as often as possible Wound #6 Left,Plantar Metatarsal head fifth o Elevate legs to the level of the heart and pump ankles as often as possible Wound #7 Right,Lateral Foot o Elevate legs to the level of the heart and pump ankles as often as possible Off-Loading Wound #4 Right,Plantar Metatarsal head first o Other: - front off loader Wound #5 Right,Plantar Metatarsal head third o Other: - front off loader Wound #6 Left,Plantar Metatarsal head fifth o Other: - front off loader Wound #7 Right,Lateral Foot o Other: - front off loader Additional Orders / Instructions Wound #4 Right,Plantar Metatarsal head first o Increase protein intake. Wound #5 Right,Plantar Metatarsal head third o Increase protein intake. Wound #6 Left,Plantar Metatarsal head fifth o Increase protein intake. Wound #7 Right,Lateral Foot o Increase protein intake. Home Health Wound #4 Right,Plantar Metatarsal head first o Initiate Home Health for Skilled Nursing o Home Health Nurse may visit PRN to address patientos wound care needs. o FACE TO FACE ENCOUNTER: MEDICARE and MEDICAID PATIENTS: I certify that this patient is under my care and that I had a face-to-face encounter that meets the physician face-to-face encounter requirements with this patient on this date. The encounter with the patient was in ALDRICK, DERRIG. ([id number]) whole or in part for the following MEDICAL CONDITION: (primary reason for Home Healthcare) MEDICAL NECESSITY: I certify, that based on my findings, NURSING services are a medically necessary home health service. HOME BOUND STATUS: I certify that my clinical findings support that this patient is homebound (i.e., Due to illness or injury, pt requires aid of supportive devices such as crutches, cane, wheelchairs, walkers, the use of special transportation or the assistance of another person to leave their place of residence. There is a normal  inability to leave the home and doing so requires considerable and taxing effort. Other absences are for medical reasons / religious services and are infrequent or of short duration when for other reasons). o If current dressing causes regression in wound condition, may D/C ordered dressing product/s and apply Normal Saline Moist Dressing daily until next Wound Healing Center / Other MD appointment. Notify Wound Healing Center of regression in wound condition at [phone]. o Please direct any NON-WOUND related issues/requests for orders to patient's Primary Care Physician Wound #5 Right,Plantar Metatarsal head third o Initiate Home Health for Skilled Nursing o Home Health Nurse may visit PRN to address patientos wound care needs. o FACE TO FACE ENCOUNTER: MEDICARE and MEDICAID PATIENTS: I certify that this patient is under my care and that I had a face-to-face encounter that meets the physician face-to-face encounter requirements with this patient on this date. The encounter with the patient was in whole or in part for the following MEDICAL CONDITION: (primary reason for Home Healthcare) MEDICAL NECESSITY: I certify, that based on my findings, NURSING services are a medically necessary home health service. HOME BOUND STATUS: I certify that my clinical findings support that this patient is homebound (i.e., Due to illness or injury, pt requires aid of supportive devices such as crutches, cane, wheelchairs, walkers, the use of special transportation or the assistance of another person to leave their place of residence. There is a normal inability to leave the home and doing so requires considerable and taxing effort. Other absences are for medical reasons /  religious services and are infrequent or of short duration when for other reasons). o If current dressing causes regression in wound condition, may D/C ordered dressing product/s and apply Normal Saline Moist Dressing daily  until next Wound Healing Center / Other MD appointment. Notify Wound Healing Center of regression in wound condition at [phone]. o Please direct any NON-WOUND related issues/requests for orders to patient's Primary Care Physician Wound #6 Left,Plantar Metatarsal head fifth o Initiate Home Health for Skilled Nursing o Home Health Nurse may visit PRN to address patientos wound care needs. o FACE TO FACE ENCOUNTER: MEDICARE and MEDICAID PATIENTS: I certify that this patient is under my care and that I had a face-to-face encounter that meets the physician face-to-face encounter requirements with this patient on this date. The encounter with the patient was in whole or in part for the following MEDICAL CONDITION: (primary reason for Home Healthcare) MEDICAL NECESSITY: I certify, that based on my findings, NURSING services are a medically necessary home health service. HOME BOUND STATUS: I certify that my clinical findings support that this patient is homebound (i.e., Due to illness or injury, pt requires aid of supportive devices such as crutches, cane, wheelchairs, walkers, the use of special transportation or the assistance of another person to leave their place of residence. There is a normal inability to leave the home and doing so requires considerable and taxing effort. Other Schiavi, Irma NewnessRTHUR E. ([id number]) absences are for medical reasons / religious services and are infrequent or of short duration when for other reasons). o If current dressing causes regression in wound condition, may D/C ordered dressing product/s and apply Normal Saline Moist Dressing daily until next Wound Healing Center / Other MD appointment. Notify Wound Healing Center of regression in wound condition at [phone]. o Please direct any NON-WOUND related issues/requests for orders to patient's Primary Care Physician Wound #7 Right,Lateral Foot o Initiate Home Health for Skilled Nursing o Home  Health Nurse may visit PRN to address patientos wound care needs. o FACE TO FACE ENCOUNTER: MEDICARE and MEDICAID PATIENTS: I certify that this patient is under my care and that I had a face-to-face encounter that meets the physician face-to-face encounter requirements with this patient on this date. The encounter with the patient was in whole or in part for the following MEDICAL CONDITION: (primary reason for Home Healthcare) MEDICAL NECESSITY: I certify, that based on my findings, NURSING services are a medically necessary home health service. HOME BOUND STATUS: I certify that my clinical findings support that this patient is homebound (i.e., Due to illness or injury, pt requires aid of supportive devices such as crutches, cane, wheelchairs, walkers, the use of special transportation or the assistance of another person to leave their place of residence. There is a normal inability to leave the home and doing so requires considerable and taxing effort. Other absences are for medical reasons / religious services and are infrequent or of short duration when for other reasons). o If current dressing causes regression in wound condition, may D/C ordered dressing product/s and apply Normal Saline Moist Dressing daily until next Wound Healing Center / Other MD appointment. Notify Wound Healing Center of regression in wound condition at [phone]. o Please direct any NON-WOUND related issues/requests for orders to patient's Primary Care Physician Electronic Signature(s) Signed: 07/09/2016 2:37:26 PM By: Evlyn KannerBritto, Titianna Loomis MD, FACS Signed: 07/09/2016 4:15:11 PM By: Elliot GurneyWoody, BSN, RN, CWS, Kim RN, BSN Entered By: Elliot GurneyWoody, BSN, RN, CWS, Kim on 07/09/2016 11:58:18 Graddy, Irma NewnessARTHUR E. ([id number]) --------------------------------------------------------------------------------  Problem List Details Patient Name: AAYUSH, GELPI Date of Service: 07/09/2016 10:30 AM Medical Record Number:  [id number] Patient Account Number: [id number] Date of Birth/Sex: 03-24-1934 (82 y.o. 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 Male) Treating RN: Phillis Haggis Primary Care Provider: Baruch Gouty Other Clinician: Referring Provider: Baruch Gouty Treating Provider/Extender: Rudene Re in Treatment: 0 Subjective Chief Complaint Information obtained from Patient This 82 year old patient is a very poor historian and has no family member with him. He has ulcers both feet which are present for about 2 weeks as per his history but I believe there have been callouses is for longer than that History of Present Illness (HPI) The following HPI elements were documented for the patient's wound: Location: ulcers on both feet Quality: Patient reports No Pain. Severity: Patient states wound are getting worse. Duration: Patient states that they are not certain how long the wound has been present. Context: The wound appeared gradually over  time Modifying Factors: Consults to this date include: was seen by his PCP recently. Associated Signs and Symptoms: Patient reports having difficulty standing for long periods. 82 year old patient was recently seen by his PCP Dr. Baruch Gouty, who noted a large tunneling ulcer and blister on the left foot extending medially near the base of the toe. There were also 2 small ulcers on the right foot. his most recent hemoglobin A1c was 5.8 He has been placed on clindamycin recently He has been treated by her for congestive heart failure and was recently put on clindamycin for the abscesses and ulceration of the foot. Past medical history has been significant for diabetes mellitus, coronary artery disease, congestive heart failure and ischemic cardiomyopathy with status post amputation of left fourth finger, appendectomy, coronary artery bypass graft, defibrillator placement. Never been a smoker. most recent laboratory investigations done showed WBC of 8000, hemoglobin of 13.2 hematocrit of 38.3 and platelets of 253 with a normal BMP. No recent x-rays have been done ==== Old notes: 82 year old gentleman with a past medical history of COPD, GERD, hyperlipidemia, hypertension, arthritis, peripheral neuropathy and diabetes mellitus was seen in the ER recently and worked up for bilateral feet ulceration. He was also seen recently by Dr. Baruch Gouty with whom he went to establish care in early September. He also sees a podiatrist Dr. Graciela Husbands and there is some question of whether he had osteomyelitis Regino, Javari E. ([id number]) in the past. 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 Most recent x-rays done in the ER on 10/23/2014 did not show any evidence of osteomyelitis both feet. The patient does not check his blood sugar, does not take treatment regularly and is very difficult to understand. We will make a call to  his daughter who  lives in town and see if we can get some better history from her. 11/01/2014 -- he is here with his daughter today and she says she is going to help him see the podiatrist and get some appointments for surgical care. She also understands that he has to check his blood glucose regularly at home and control his diabetes. 11/08/2014 -- he was seen by Dr. Graciela Husbands the podiatrist who had x-rays done -- the right foot showed no clear evidence of osteomyelitis but there was evidence of peripheral vascular disease. the left foot all show showed no clear evidence of osteomyelitis but there was peripheral vascular disease. Dr. Alberteen Spindle had debrided tissue from the right foot and applied some local Bactroban and sterile bandage and debrided all the toenails and give him instruction to come back when necessary. ==== Wound History Patient presents with 3 open wounds that have been present for approximately 2 weeks. Patient has been treating wounds in the following manner: abx. The wounds have been healed in the past but have re- opened. Laboratory tests have been performed in the last month. Patient reportedly has tested positive for an antibiotic resistant organism. Patient reportedly has not tested positive for osteomyelitis. Patient reportedly has had testing performed to evaluate circulation in the legs. Patient experiences the following problems associated with their wounds: infection. Patient History Information obtained from Patient. Allergies Percocet Family History Cancer - Mother, Father, Heart Disease - Mother, Hypertension - Mother, Lung Disease - Father, No family history of Diabetes, Hereditary Spherocytosis, Kidney Disease, Seizures, Stroke, Thyroid Problems, Tuberculosis. Social History Never smoker, Marital Status - Separated, Alcohol Use - Never, Drug Use - No History, Caffeine Use - Never. Medical History Eyes Denies history of Cataracts, Glaucoma, Optic  Neuritis Ear/Nose/Mouth/Throat Denies history of Chronic sinus problems/congestion, Middle ear problems Hematologic/Lymphatic Denies history of Anemia, Hemophilia, Human Immunodeficiency Virus, Lymphedema, Sickle Cell Disease Respiratory Denies history of Aspiration, Asthma, Pneumothorax, Sleep Apnea, Tuberculosis Cardiovascular Everetts, ANVITH MAURIELLO. ([id number]) Denies history of Angina, Arrhythmia, Congestive Heart Failure, Deep Vein Thrombosis, Hypotension, Myocardial Infarction, Peripheral Arterial Disease, Peripheral Venous Disease, Phlebitis, Vasculitis Gastrointestinal Denies history of Cirrhosis , Colitis, Crohn s, Hepatitis B, Hepatitis C Endocrine Patient has history of Type II Diabetes Denies history of Type I Diabetes Genitourinary Patient has history of End Stage Renal Disease Immunological Denies history of Lupus Erythematosus, Raynaud s, Scleroderma Integumentary (Skin) Denies history of History of Burn, History of pressure wounds Musculoskeletal Denies history of Gout, Rheumatoid Arthritis, Osteoarthritis, Osteomyelitis Neurologic Denies history of Dementia, Quadriplegia, Paraplegia, Seizure Disorder Psychiatric Denies history of Anorexia/bulimia, Confinement Anxiety Patient is treated with Oral Agents. Blood sugar is not tested. Medical And Surgical History Notes Constitutional Symptoms (General Health) DM II; colon cancer; Hepititis Cardiovascular HDL Review of Systems (ROS) Constitutional Symptoms (General Health) The patient has no complaints or symptoms. Eyes The patient has no complaints or symptoms. Ear/Nose/Mouth/Throat The patient has no complaints or symptoms. Hematologic/Lymphatic The patient has no complaints or symptoms. Respiratory The patient has no complaints or symptoms. Cardiovascular The patient has no complaints or symptoms. Gastrointestinal The patient has no complaints or symptoms. Endocrine The patient has no complaints or  symptoms. Genitourinary The patient has no complaints or symptoms. Immunological The patient has no complaints or symptoms. Integumentary (Skin) Complains or has symptoms of Wounds. Asper, GABRIELLE MESTER ([id number]) Denies complaints or symptoms of Bleeding or bruising tendency, Breakdown, Swelling. Musculoskeletal The patient has no complaints or symptoms. Neurologic The patient has no complaints or symptoms. Psychiatric The patient has no  complaints or symptoms. Medications saw palmetto, serenoa repens oral capsule oral acetaminophen 500 mg tablet oral tablet oral buspirone 5 mg tablet oral tablet oral gabapentin 300 mg capsule oral capsule oral atorvastatin 40 mg tablet oral tablet oral clindamycin HCl 300 mg capsule oral capsule oral furosemide 20 mg tablet oral tablet oral aspirin 81 mg tablet,delayed release oral tablet,delayed release (DR/EC) oral potassium chloride ER 10 mEq tablet,extended release oral tablet extended release oral omeprazole 40 mg capsule,delayed release oral capsule,delayed release(DR/EC) oral cyanocobalamin (vit B-12) 500 mcg tablet oral tablet oral cholecalciferol (vitamin D3) 1,000 unit tablet oral tablet oral Objective Constitutional Pulse regular. Respirations normal and unlabored. Afebrile. Vitals Time Taken: 10:49 AM, Height: 72 in, Weight: 227 lbs, BMI: 30.8, Temperature: 97.6 F, Pulse: 66 bpm, Respiratory Rate: 16 breaths/min, Blood Pressure: 131/59 mmHg. Eyes Nonicteric. Reactive to light. Ears, Nose, Mouth, and Throat Lips, teeth, and gums WNL.[name] Moist mucosa without lesions. Neck supple and nontender. No palpable supraclavicular or cervical adenopathy. Normal sized without goiter. Respiratory WNL. No retractions.JASIM, HARARI ([id number]) Cardiovascular Pedal Pulses WNL. ABI on the left was 1.36 on the right was 1.27. No clubbing, cyanosis and has mild grade 1 lymphedema bilaterally.. Gastrointestinal (GI) Abdomen without masses  or tenderness.. No liver or spleen enlargement or tenderness.. Lymphatic No adneopathy. No adenopathy. No adenopathy. Musculoskeletal Adexa without tenderness or enlargement.. Digits and nails w/o clubbing, cyanosis, infection, petechiae, ischemia, or inflammatory conditions.[name] Psychiatric Judgement and insight Intact.. No evidence of depression, anxiety, or agitation.. General Notes: the patient has 3 superficial ulcerations on the right forefoot in the region of the first metatarsal head third metatarsal head and the fifth metatarsal head. All 3 were sharply debrided. On the right foot he has a fairly large ulcerated area which needed sharp debridement and this is on the first metatarsal head going towards the base of his left big toe. Minimal bleeding controlled with pressure Integumentary (Hair, Skin) No suspicious lesions. No crepitus or fluctuance. No peri-wound warmth or erythema. No masses.. Wound #4 status is Open. Original cause of wound was Gradually Appeared. The wound is located on the Right,Plantar Metatarsal head first. The wound measures 1cm length x 0.5cm width x 0.3cm depth; 0.393cm^2 area and 0.118cm^3 volume. There is a none present amount of drainage noted. The wound margin is flat and intact. The periwound skin appearance exhibited: Ecchymosis. Wound #5 status is Open. Original cause of wound was Gradually Appeared. The wound is located on the Right,Plantar Metatarsal head third. The wound measures 0.5cm length x 0.5cm width x 0.6cm depth; 0.196cm^2 area and 0.118cm^3 volume. There is no tunneling or undermining noted. There is a none present amount of drainage noted. The wound margin is flat and intact. There is no granulation within the wound bed. There is no necrotic tissue within the wound bed. The periwound skin appearance exhibited: Callus, Ecchymosis. Wound #6 status is Open. Original cause of wound was Gradually Appeared. The wound is located on the Left,Plantar  Metatarsal head fifth. The wound measures 0.5cm length x 0.6cm width x 0.5cm depth; 0.236cm^2 area and 0.118cm^3 volume. There is Fat Layer (Subcutaneous Tissue) Exposed exposed. There is no tunneling noted, however, there is undermining starting at 12:00 and ending at 12:00 with a maximum distance of 1.2cm. There is a large amount of serous drainage noted. The wound margin is distinct with the outline attached to the wound base. There is no granulation within the wound bed. There is a large (67-100%) amount of necrotic tissue within the wound  bed including Eschar. The periwound skin appearance exhibited: Ecchymosis. The periwound skin appearance did not exhibit: Callus, Crepitus, Excoriation, Induration, Rash, Scarring, Dry/Scaly, Maceration, Atrophie Blanche, Cyanosis, Hemosiderin Staining, Mottled, Pallor, Rubor, Erythema. Wound #7 status is Open. Original cause of wound was Gradually Appeared. The wound is located on the MAGDIEL, BARTLES. ([id number]) Right,Lateral Foot. The wound measures 1.5cm length x 1.2cm width x 0.3cm depth; 1.414cm^2 area and 0.424cm^3 volume. There is Fat Layer (Subcutaneous Tissue) Exposed exposed. There is no tunneling or undermining noted. There is a medium amount of serous drainage noted. The wound margin is flat and intact. There is small (1-33%) pink granulation within the wound bed. There is a large (67-100%) amount of necrotic tissue within the wound bed including Adherent Slough. The periwound skin appearance exhibited: Erythema. The periwound skin appearance did not exhibit: Callus, Crepitus, Excoriation, Induration, Rash, Scarring, Dry/Scaly, Maceration, Atrophie Blanche, Cyanosis, Ecchymosis, Hemosiderin Staining, Mottled, Pallor, Rubor. The surrounding wound skin color is noted with erythema. Assessment Active Problems ICD-10 E11.621 - Type 2 diabetes mellitus with foot ulcer L97.512 - Non-pressure chronic ulcer of other part of right foot with fat  layer exposed L97.522 - Non-pressure chronic ulcer of other part of left foot with fat layer exposed this 82 year old gentleman who is rather noncompliant and difficult to treat, has well-controlled diabetes with an appropriate diet. After review today I have recommended: 1. Silver alginate, offloading felt and dressing changes to be done every other day 2. Darco Front offloading shoes 3. appropriate control of his diabetes mellitus 4. I have not requested x-rays at this stage as these are fairly superficial 5. Regular physician the wound center Procedures Wound #4 Pre-procedure diagnosis of Wound #4 is a Diabetic Wound/Ulcer of the Lower Extremity located on the Right,Plantar Metatarsal head first .Severity of Tissue Pre Debridement is: Fat layer exposed. There was a Skin/Subcutaneous Tissue Debridement ([id number]) debridement with total area of 0.15 sq cm performed by Evlyn Kanner, MD. with the following instrument(s): Forceps and Scissors to remove Viable and Non-Viable tissue/material including Fibrin/Slough, Skin, and Subcutaneous after achieving pain control using Lidocaine 4% Topical Solution. A time out was conducted at 11:32, prior to the start of the procedure. A Minimum amount of bleeding was controlled with Pressure. The procedure was tolerated well with a pain level of 0 throughout and a pain level of 0 following the procedure. Post Debridement Measurements: 2.5cm length x 1cm width x 0.3cm depth; 0.589cm^3 volume. Character of Wound/Ulcer Post Debridement requires further debridement. Severity of Tissue Post Debridement is: Fat layer exposed. Post procedure Diagnosis Wound #4: Same as Pre-Procedure Kelly, Dallas E. ([id number]) Wound #5 Pre-procedure diagnosis of Wound #5 is a Diabetic Wound/Ulcer of the Lower Extremity located on the Right,Plantar Metatarsal head third .Severity of Tissue Pre Debridement is: Fat layer exposed. There was a Skin/Subcutaneous Tissue  Debridement ([id number]) debridement with total area of 0.25 sq cm performed by Evlyn Kanner, MD. with the following instrument(s): Forceps and Scissors to remove Viable and Non-Viable tissue/material including Fibrin/Slough, Skin, and Subcutaneous after achieving pain control using Lidocaine 4% Topical Solution. A time out was conducted at 11:32, prior to the start of the procedure. A Minimum amount of bleeding was controlled with Pressure. The procedure was tolerated well with a pain level of 0 throughout and a pain level of 0 following the procedure. Post Debridement Measurements: 2cm length x 2.5cm width x 0.2cm depth; 0.785cm^3 volume. Character of Wound/Ulcer Post Debridement requires further debridement. Severity of Tissue Post Debridement is: Fat layer  exposed. Post procedure Diagnosis Wound #5: Same as Pre-Procedure Wound #6 Pre-procedure diagnosis of Wound #6 is a Diabetic Wound/Ulcer of the Lower Extremity located on the Left,Plantar Metatarsal head fifth .Severity of Tissue Pre Debridement is: Fat layer exposed. There was a Skin/Subcutaneous Tissue Debridement ([id number]) debridement with total area of 0.3 sq cm performed by Evlyn Kanner, MD. with the following instrument(s): Forceps and Scissors to remove Viable and Non- Viable tissue/material including Fibrin/Slough, Skin, and Subcutaneous after achieving pain control using Lidocaine 4% Topical Solution. A time out was conducted at 11:32, prior to the start of the procedure. A Minimum amount of bleeding was controlled with Pressure. The procedure was tolerated well with a pain level of 0 throughout and a pain level of 0 following the procedure. Post Debridement Measurements: 3.5cm length x 4cm width x 0.2cm depth; 2.199cm^3 volume. Character of Wound/Ulcer Post Debridement requires further debridement. Severity of Tissue Post Debridement is: Fat layer exposed. Post procedure Diagnosis Wound #6: Same as Pre-Procedure Wound  #7 Pre-procedure diagnosis of Wound #7 is a Diabetic Wound/Ulcer of the Lower Extremity located on the Right,Lateral Foot .Severity of Tissue Pre Debridement is: Fat layer exposed. There was a Skin/Subcutaneous Tissue Debridement ([id number]) debridement with total area of 1.8 sq cm performed by Evlyn Kanner, MD. with the following instrument(s): Forceps and Scissors to remove Viable and Non- Viable tissue/material including Skin and Subcutaneous after achieving pain control using Lidocaine 4% Topical Solution. A time out was conducted at 11:32, prior to the start of the procedure. A Minimum amount of bleeding was controlled with Pressure. The procedure was tolerated well with a pain level of 0 throughout and a pain level of 0 following the procedure. Post Debridement Measurements: 1.5cm length x 1.2cm width x 0.3cm depth; 0.424cm^3 volume. Character of Wound/Ulcer Post Debridement requires further debridement. Severity of Tissue Post Debridement is: Fat layer exposed. Post procedure Diagnosis Wound #7: Same as Pre-Procedure Plan KO, BARDON. ([id number]) Wound Cleansing: Wound #4 Right,Plantar Metatarsal head first: Clean wound with Normal Saline. May Shower, gently pat wound dry prior to applying new dressing. Wound #5 Right,Plantar Metatarsal head third: Clean wound with Normal Saline. May Shower, gently pat wound dry prior to applying new dressing. Wound #6 Left,Plantar Metatarsal head fifth: Clean wound with Normal Saline. May Shower, gently pat wound dry prior to applying new dressing. Wound #7 Right,Lateral Foot: Clean wound with Normal Saline. May Shower, gently pat wound dry prior to applying new dressing. Anesthetic: Wound #4 Right,Plantar Metatarsal head first: Topical Lidocaine 4% cream applied to wound bed prior to debridement Wound #5 Right,Plantar Metatarsal head third: Topical Lidocaine 4% cream applied to wound bed prior to debridement Wound #6 Left,Plantar  Metatarsal head fifth: Topical Lidocaine 4% cream applied to wound bed prior to debridement Wound #7 Right,Lateral Foot: Topical Lidocaine 4% cream applied to wound bed prior to debridement Primary Wound Dressing: Wound #4 Right,Plantar Metatarsal head first: Aquacel Ag Wound #5 Right,Plantar Metatarsal head third: Aquacel Ag Wound #6 Left,Plantar Metatarsal head fifth: Aquacel Ag Wound #7 Right,Lateral Foot: Aquacel Ag Secondary Dressing: Wound #4 Right,Plantar Metatarsal head first: ABD pad Conform/Kerlix Wound #5 Right,Plantar Metatarsal head third: ABD pad Conform/Kerlix Wound #6 Left,Plantar Metatarsal head fifth: ABD pad Conform/Kerlix Wound #7 Right,Lateral Foot: ABD pad Conform/Kerlix Dressing Change Frequency: Wound #4 Right,Plantar Metatarsal head first: Change Dressing Monday, Wednesday, Friday Wound #5 Right,Plantar Metatarsal head third: Change Dressing Monday, Wednesday, Friday MARTIN, BELLING ([id number]) Wound #6 Left,Plantar Metatarsal head fifth: Change Dressing Monday, Wednesday, Friday Wound #  7 Right,Lateral Foot: Change Dressing Monday, Wednesday, Friday Follow-up Appointments: Wound #4 Right,Plantar Metatarsal head first: Return Appointment in 1 week. Wound #5 Right,Plantar Metatarsal head third: Return Appointment in 1 week. Wound #6 Left,Plantar Metatarsal head fifth: Return Appointment in 1 week. Wound #7 Right,Lateral Foot: Return Appointment in 1 week. Edema Control: Wound #4 Right,Plantar Metatarsal head first: Elevate legs to the level of the heart and pump ankles as often as possible Wound #5 Right,Plantar Metatarsal head third: Elevate legs to the level of the heart and pump ankles as often as possible Wound #6 Left,Plantar Metatarsal head fifth: Elevate legs to the level of the heart and pump ankles as often as possible Wound #7 Right,Lateral Foot: Elevate legs to the level of the heart and pump ankles as often as  possible Off-Loading: Wound #4 Right,Plantar Metatarsal head first: Other: - front off loader Wound #5 Right,Plantar Metatarsal head third: Other: - front off loader Wound #6 Left,Plantar Metatarsal head fifth: Other: - front off loader Wound #7 Right,Lateral Foot: Other: - front off loader Additional Orders / Instructions: Wound #4 Right,Plantar Metatarsal head first: Increase protein intake. Wound #5 Right,Plantar Metatarsal head third: Increase protein intake. Wound #6 Left,Plantar Metatarsal head fifth: Increase protein intake. Wound #7 Right,Lateral Foot: Increase protein intake. Home Health: Wound #4 Right,Plantar Metatarsal head first: Initiate Home Health for Skilled Nursing Home Health Nurse may visit PRN to address patient s wound care needs. FACE TO FACE ENCOUNTER: MEDICARE and MEDICAID PATIENTS: I certify that this patient is under my care and that I had a face-to-face encounter that meets the physician face-to-face encounter requirements with this patient on this date. The encounter with the patient was in whole or in part for the following MEDICAL CONDITION: (primary reason for Home Healthcare) MEDICAL NECESSITY: I certify, that based on my findings, NURSING services are a medically necessary home health service. HOME BOUND STATUS: I certify that my clinical findings support that this patient is homebound (i.e., Due to illness or injury, pt requires aid of supportive devices such as crutches, cane, wheelchairs, walkers, the use Zimbelman, Yisroel E. ([id number]) of special transportation or the assistance of another person to leave their place of residence. There is a normal inability to leave the home and doing so requires considerable and taxing effort. Other absences are for medical reasons / religious services and are infrequent or of short duration when for other reasons). If current dressing causes regression in wound condition, may D/C ordered dressing product/s and  apply Normal Saline Moist Dressing daily until next Wound Healing Center / Other MD appointment. Notify Wound Healing Center of regression in wound condition at [phone]. Please direct any NON-WOUND related issues/requests for orders to patient's Primary Care Physician Wound #5 Right,Plantar Metatarsal head third: Initiate Home Health for Skilled Nursing Home Health Nurse may visit PRN to address patient s wound care needs. FACE TO FACE ENCOUNTER: MEDICARE and MEDICAID PATIENTS: I certify that this patient is under my care and that I had a face-to-face encounter that meets the physician face-to-face encounter requirements with this patient on this date. The encounter with the patient was in whole or in part for the following MEDICAL CONDITION: (primary reason for Home Healthcare) MEDICAL NECESSITY: I certify, that based on my findings, NURSING services are a medically necessary home health service. HOME BOUND STATUS: I certify that my clinical findings support that this patient is homebound (i.e., Due to illness or injury, pt requires aid of supportive devices such as crutches, cane, wheelchairs, walkers,  the use of special transportation or the assistance of another person to leave their place of residence. There is a normal inability to leave the home and doing so requires considerable and taxing effort. Other absences are for medical reasons / religious services and are infrequent or of short duration when for other reasons). If current dressing causes regression in wound condition, may D/C ordered dressing product/s and apply Normal Saline Moist Dressing daily until next Wound Healing Center / Other MD appointment. Notify Wound Healing Center of regression in wound condition at [phone]. Please direct any NON-WOUND related issues/requests for orders to patient's Primary Care Physician Wound #6 Left,Plantar Metatarsal head fifth: Initiate Home Health for Skilled Nursing Home Health  Nurse may visit PRN to address patient s wound care needs. FACE TO FACE ENCOUNTER: MEDICARE and MEDICAID PATIENTS: I certify that this patient is under my care and that I had a face-to-face encounter that meets the physician face-to-face encounter requirements with this patient on this date. The encounter with the patient was in whole or in part for the following MEDICAL CONDITION: (primary reason for Home Healthcare) MEDICAL NECESSITY: I certify, that based on my findings, NURSING services are a medically necessary home health service. HOME BOUND STATUS: I certify that my clinical findings support that this patient is homebound (i.e., Due to illness or injury, pt requires aid of supportive devices such as crutches, cane, wheelchairs, walkers, the use of special transportation or the assistance of another person to leave their place of residence. There is a normal inability to leave the home and doing so requires considerable and taxing effort. Other absences are for medical reasons / religious services and are infrequent or of short duration when for other reasons). If current dressing causes regression in wound condition, may D/C ordered dressing product/s and apply Normal Saline Moist Dressing daily until next Wound Healing Center / Other MD appointment. Notify Wound Healing Center of regression in wound condition at [phone]. Please direct any NON-WOUND related issues/requests for orders to patient's Primary Care Physician Wound #7 Right,Lateral Foot: Initiate Home Health for Skilled Nursing Home Health Nurse may visit PRN to address patient s wound care needs. FACE TO FACE ENCOUNTER: MEDICARE and MEDICAID PATIENTS: I certify that this patient is under my care and that I had a face-to-face encounter that meets the physician face-to-face encounter requirements with this patient on this date. The encounter with the patient was in whole or in part for the following MEDICAL CONDITION:  (primary reason for Home Healthcare) MEDICAL NECESSITY: I certify, that based on my findings, NURSING services are a medically necessary home health service. HOME BOUND STATUS: I certify that my clinical findings support that this patient is homebound (i.e., Due to illness or injury, pt requires aid of supportive devices such as crutches, cane, wheelchairs, walkers, the use Popovich, Mayan E. ([id number]) of special transportation or the assistance of another person to leave their place of residence. There is a normal inability to leave the home and doing so requires considerable and taxing effort. Other absences are for medical reasons / religious services and are infrequent or of short duration when for other reasons). If current dressing causes regression in wound condition, may D/C ordered dressing product/s and apply Normal Saline Moist Dressing daily until next Wound Healing Center / Other MD appointment. Notify Wound Healing Center of regression in wound condition at [phone]. Please direct any NON-WOUND related issues/requests for orders to patient's Primary Care Physician this 82 year old gentleman who is rather noncompliant  and difficult to treat, has well-controlled diabetes with an appropriate diet. After review today I have recommended: 1. Silver alginate, offloading felt and dressing changes to be done every other day 2. [employer] Front offloading shoes 3. appropriate control of his diabetes mellitus 4. I have not requested x-rays at this stage as these are fairly superficial 5. Regular physician the wound center Electronic Signature(s) Signed: 07/15/2016 4:38:25 PM By: Evlyn Kanner MD, FACS Previous Signature: 07/09/2016 2:38:51 PM Version By: Evlyn Kanner MD, FACS Previous Signature: 07/09/2016 12:09:03 PM Version By: Evlyn Kanner MD, FACS Entered By: Evlyn Kanner on 07/15/2016 16:38:25 Mccue, Irma Newness  ([id number]) -------------------------------------------------------------------------------- ROS/PFSH Details Patient Name: [name] Date of Service: 07/09/2016 10:30 AM Medical Record Number: [id number] Patient Account Number: [id number] Date of Birth/Sex: 1935/01/26 (82 y.o. Male) Treating RN: Huel Coventry Primary Care Provider: Baruch Gouty Other Clinician: Referring Provider: Baruch Gouty Treating Provider/Extender: Rudene Re in Treatment: 0 Information Obtained From Patient Wound History Do you currently have one or more open woundso Yes How many open wounds do you currently haveo 3 Approximately how long have you had your woundso 2 weeks How have you been treating your wound(s) until nowo abx Has your wound(s) ever healed and then re-openedo Yes Have you had any lab work done in the past montho Yes Who ordered the lab work doneo PCP Have you tested positive for osteomyelitis (bone infection)o No Have you had any tests for circulation on your legso Yes Where was the test doneo [facility] Have you had other problems associated with your woundso Infection Integumentary (Skin) Complaints and Symptoms: Positive for: Wounds Negative for: Bleeding or bruising tendency; Breakdown; Swelling Medical History: Negative for: History of Burn; History of pressure wounds Constitutional Symptoms (General Health) Complaints and Symptoms: No Complaints or Symptoms Medical History: Past Medical History Notes: DM II; colon cancer; Hepititis Eyes Complaints and Symptoms: No Complaints or Symptoms Medical History: Negative for: Cataracts; Glaucoma; Optic Neuritis FRANCISCA, HARBUCK ([id number]) Ear/Nose/Mouth/Throat Complaints and Symptoms: No Complaints or Symptoms Medical History: Negative for: Chronic sinus problems/congestion; Middle ear problems Hematologic/Lymphatic Complaints and Symptoms: No Complaints or Symptoms Medical History: Negative for: Anemia;  Hemophilia; Human Immunodeficiency Virus; Lymphedema; Sickle Cell Disease Respiratory Complaints and Symptoms: No Complaints or Symptoms Medical History: Positive for: Chronic Obstructive Pulmonary Disease (COPD) Negative for: Aspiration; Asthma; Pneumothorax; Sleep Apnea; Tuberculosis Cardiovascular Complaints and Symptoms: No Complaints or Symptoms Medical History: Positive for: Coronary Artery Disease; Hypertension Negative for: Angina; Arrhythmia; Congestive Heart Failure; Deep Vein Thrombosis; Hypotension; Myocardial Infarction; Peripheral Arterial Disease; Peripheral Venous Disease; Phlebitis; Vasculitis Past Medical History Notes: HDL Gastrointestinal Complaints and Symptoms: No Complaints or Symptoms Medical History: Negative for: Cirrhosis ; Colitis; Crohnos; Hepatitis B; Hepatitis C Endocrine Complaints and Symptoms: No Complaints or Symptoms Medical History: Positive for: Type II Diabetes Negative for: Type I Diabetes Osier, LEANDRO BERKOWITZ ([id number]) Time with diabetes: unknown Treated with: Oral agents Blood sugar tested every day: No Genitourinary Complaints and Symptoms: No Complaints or Symptoms Medical History: Positive for: End Stage Renal Disease Immunological Complaints and Symptoms: No Complaints or Symptoms Medical History: Negative for: Lupus Erythematosus; Raynaudos; Scleroderma Musculoskeletal Complaints and Symptoms: No Complaints or Symptoms Medical History: Negative for: Gout; Rheumatoid Arthritis; Osteoarthritis; Osteomyelitis Neurologic Complaints and Symptoms: No Complaints or Symptoms Medical History: Positive for: Neuropathy Negative for: Dementia; Quadriplegia; Paraplegia; Seizure Disorder Psychiatric Complaints and Symptoms: No Complaints or Symptoms Medical History: Negative for: Anorexia/bulimia; Confinement Anxiety Immunizations Pneumococcal Vaccine: Received Pneumococcal Vaccination: Yes Family and Social  History Pigeon,  CAID RADIN ([id number]) Cancer: Yes - Mother, Father; Diabetes: No; Heart Disease: Yes - Mother; Hereditary Spherocytosis: No; Hypertension: Yes - Mother; Kidney Disease: No; Lung Disease: Yes - Father; Seizures: No; Stroke: No; Thyroid Problems: No; Tuberculosis: No; Never smoker; Marital Status - Separated; Alcohol Use: Never; Drug Use: No History; Caffeine Use: Never; Financial Concerns: No; Food, Clothing or Shelter Needs: No; Support System Lacking: No; Transportation Concerns: No; Advanced Directives: No; Patient does not want information on Radio broadcast assistant) Signed: 07/09/2016 2:37:26 PM By: Evlyn Kanner MD, FACS Signed: 07/09/2016 4:15:11 PM By: Elliot Gurney, BSN, RN, CWS, Kim RN, BSN Entered By: Elliot Gurney, BSN, RN, CWS, Kim on 07/09/2016 11:11:19 Fjelstad, Irma Newness ([id number]) -------------------------------------------------------------------------------- SuperBill Details Patient Name: [name] Date of Service: 07/09/2016 Medical Record Number: [id number] Patient Account Number: [id number] Date of Birth/Sex: June 11, 1934 (81 y.o. 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 Facility Procedures CPT4 Code Description: 62952841 99213 - WOUND CARE VISIT-LEV 3 EST PT Modifier: Quantity: 1 CPT4 Code Description: 32440102 11042 - DEB SUBQ TISSUE 20 SQ CM/< ICD-10 Description Diagnosis E11.621 Type 2 diabetes mellitus with foot ulcer L97.512 Non-pressure chronic ulcer of other part of right foo L97.522 Non-pressure chronic ulcer of other  part  of left foot Modifier: t with fat la with fat lay Quantity: 1 yer exposed er exposed Physician Procedures CPT4 Code Description: 7253664 99214 - WC PHYS LEVEL 4 - EST PT ICD-10 Description Diagnosis E11.621 Type 2 diabetes mellitus with foot ulcer L97.512 Non-pressure chronic ulcer of other part of right foo L97.522 Non-pressure chronic ulcer of other part  of left foot Modifier: 25 t with fat lay with fat laye Quantity: 1 er exposed r exposed CPT4 Code Description: 4034742 11042 - WC PHYS SUBQ TISS 20 SQ CM ICD-10 Description Diagnosis E11.621 Type 2 diabetes mellitus with foot ulcer L97.512 Non-pressure chronic ulcer of other part of right foo L97.522 Non-pressure chronic ulcer of other part  of left foot Deasis, Tallen E. ([id number]) Modifier: t with fat lay with fat laye Quantity: 1 er exposed r exposed Electronic Signature(s) Signed: 07/09/2016 2:38:58 PM By: Evlyn Kanner MD, FACS Previous Signature: 07/09/2016 12:11:02 PM Version By: Elliot Gurney, BSN, RN, CWS, Kim RN, BSN Previous Signature: 07/09/2016 2:37:26 PM Version By: Evlyn Kanner MD, FACS Previous Signature: 07/09/2016 12:09:40 PM Version By: Evlyn Kanner MD, FACS Entered By: Evlyn Kanner on 07/09/2016 14:38:57

## 2016-07-11 LAB — WOUND CULTURE: Gram Stain: NONE SEEN

## 2016-07-11 NOTE — Progress Notes (Signed)
SANAV, REMER ([id number]) Visit Report for 07/09/2016 Allergy List Details Patient Name: [name] Date of Service: 07/09/2016 10:30 AM Medical Record Number: [id number] Patient Account Number: [id number] Date of Birth/Sex: August 27, 1934 (81 y.o. Male) Treating RN: Huel Coventry Primary Care Guenevere Roorda: Baruch Gouty Other Clinician: Referring Mary Secord: Baruch Gouty Treating Marguetta Windish/Extender: Rudene Re in Treatment: 0 Allergies Active Allergies Percocet Allergy Notes Electronic Signature(s) Signed: 07/09/2016 4:15:11 PM By: Elliot Gurney, BSN, RN, CWS, Kim RN, BSN Entered By: Elliot Gurney, BSN, RN, CWS, Kim on 07/09/2016 11:08:12 Pla, Irma Newness ([id number]) -------------------------------------------------------------------------------- Arrival Information Details Patient Name: [name] Date of Service: 07/09/2016 10:30 AM Medical Record Number: [id number] Patient Account Number: [id number] Date of Birth/Sex: 24-Nov-1934 (81 y.o. Male) Treating RN: Huel Coventry Primary Care Donnette Macmullen: Baruch Gouty Other Clinician: Referring Tatelyn Vanhecke: Baruch Gouty Treating Aizik Reh/Extender: Rudene Re in Treatment: 0 Visit Information Patient Arrived: Ambulatory Arrival Time: 10:47 Accompanied By: grandson Transfer Assistance: None Patient Identification Verified: Yes Secondary Verification Process Yes Completed: Patient Has Alerts: Yes Patient Alerts: Patient on Blood Thinner Type II Diabetic History Since Last Visit Added or deleted any medications: No Any new allergies or adverse reactions: No Had a fall or experienced change in activities of daily living that may affect risk of falls: No Signs or symptoms of abuse/neglect since last visito No Electronic Signature(s) Signed: 07/09/2016 4:15:11 PM By: Elliot Gurney, BSN, RN, CWS, Kim RN, BSN Entered By: Elliot Gurney, BSN, RN, CWS, Kim on 07/09/2016 10:49:42 Lahmann, Irma Newness  ([id number]) -------------------------------------------------------------------------------- Clinic Level of Care Assessment Details Patient Name: [name] Date of Service: 07/09/2016 10:30 AM Medical Record Number: [id number] Patient Account Number: [id number] Date of Birth/Sex: 1934-12-12 (81 y.o. Male) Treating RN: Huel Coventry Primary Care Kymberly Blomberg: Baruch Gouty Other Clinician: Referring Ram Haugan: Baruch Gouty Treating Latorya Bautch/Extender: Rudene Re in Treatment: 0 Clinic Level of Care Assessment Items TOOL 1 Quantity Score []  - Use when EandM and Procedure is performed on INITIAL visit 0 ASSESSMENTS - Nursing Assessment / Reassessment X - General Physical Exam (combine w/ comprehensive assessment (listed just 1 20 below) when performed on new pt. evals) X - Comprehensive Assessment (HX, ROS, Risk Assessments, Wounds Hx, etc.) 1 25 ASSESSMENTS - Wound and Skin Assessment / Reassessment []  - Dermatologic / Skin Assessment (not related to wound area) 0 ASSESSMENTS - Ostomy and/or Continence Assessment and Care []  - Incontinence Assessment and Management 0 []  - Ostomy Care Assessment and Management (repouching, etc.) 0 PROCESS - Coordination of Care X - Simple Patient / Family Education for ongoing care 1 15 []  - Complex (extensive) Patient / Family Education for ongoing care 0 X - Staff obtains Chiropractor, Records, Test Results / Process Orders 1 10 []  - Staff telephones HHA, Nursing Homes / Clarify orders / etc 0 []  - Routine Transfer to another Facility (non-emergent condition) 0 []  - Routine Hospital Admission (non-emergent condition) 0 []  - New Admissions / Manufacturing engineer / Ordering NPWT, Apligraf, etc. 0 []  - Emergency Hospital Admission (emergent condition) 0 PROCESS - Special Needs []  - Pediatric / Minor Patient Management 0 []  - Isolation Patient Management 0 Selph, Dekota E. ([id number]) []  - Hearing / Language / Visual special needs 0 []  -  Assessment of Community assistance (transportation, D/C planning, etc.) 0 []  - Additional assistance / Altered mentation 0 []  - Support Surface(s) Assessment (bed, cushion, seat, etc.) 0 INTERVENTIONS - Miscellaneous []  - External ear exam 0 []  - Patient Transfer (multiple staff / Nurse, adult / Similar devices) 0 []  - Simple  Staple / Suture removal (25 or less) 0 []  - Complex Staple / Suture removal (26 or more) 0 []  - Hypo/Hyperglycemic Management (do not check if billed separately) 0 X - Ankle / Brachial Index (ABI) - do not check if billed separately 1 15 Has the patient been seen at the hospital within the last three years: Yes Total Score: 85 Level Of Care: New/Established - Level 3 Electronic Signature(s) Signed: 07/09/2016 4:15:11 PM By: Elliot Gurney, BSN, RN, CWS, Kim RN, BSN Entered By: Elliot Gurney, BSN, RN, CWS, Kim on 07/09/2016 12:10:46 Twining, Irma Newness ([id number]) -------------------------------------------------------------------------------- Encounter Discharge Information Details Patient Name: [name] Date of Service: 07/09/2016 10:30 AM Medical Record Number: [id number] Patient Account Number: [id number] Date of Birth/Sex: 06/02/1934 (81 y.o. Male) Treating RN: Phillis Haggis Primary Care Gustave Lindeman: Baruch Gouty Other Clinician: Referring Crystina Borrayo: Baruch Gouty Treating Nazly Digilio/Extender: Rudene Re in Treatment: 0 Encounter Discharge Information Items Discharge Pain Level: 0 Discharge Condition: Stable Ambulatory Status: Ambulatory Discharge Destination: Home Transportation: Private Auto Accompanied By: grandson Schedule Follow-up Appointment: Yes Medication Reconciliation completed and provided to Patient/Care Yes Catheleen Langhorne: Provided on Clinical Summary of Care: 07/09/2016 Form Type Recipient Paper Patient AF Electronic Signature(s) Signed: 07/09/2016 12:14:31 PM By: Elliot Gurney, BSN, RN, CWS, Kim RN, BSN Previous Signature: 07/09/2016 11:58:42 AM Version  By: Gwenlyn Perking Entered By: Elliot Gurney, BSN, RN, CWS, Kim on 07/09/2016 12:14:31 Butz, Irma Newness ([id number]) -------------------------------------------------------------------------------- Lower Extremity Assessment Details Patient Name: [name] Date of Service: 07/09/2016 10:30 AM Medical Record Number: [id number] Patient Account Number: [id number] Date of Birth/Sex: [birth year] (81 y.o. Male) Treating RN: Huel Coventry Primary Care Aitanna Haubner: Baruch Gouty Other Clinician: Referring Tonae Livolsi: Baruch Gouty Treating Ayren Zumbro/Extender: Rudene Re in Treatment: 0 Vascular Assessment Pulses: Dorsalis Pedis Palpable: [Left:Yes] [Right:Yes] Doppler Audible: [Left:Yes] [Right:Yes] Posterior Tibial Palpable: [Left:Yes] [Right:Yes] Doppler Audible: [Left:Yes] [Right:Yes] Extremity colors, hair growth, and conditions: Extremity Color: [Left:Normal] [Right:Normal] Hair Growth on Extremity: [Left:Yes] [Right:Yes] Temperature of Extremity: [Left:Warm] [Right:Warm] Capillary Refill: [Left:< 3 seconds] [Right:< 3 seconds] Blood Pressure: Brachial: [Left:100] [Right:110] Dorsalis Pedis: 150 [Left:Dorsalis Pedis: 140] Ankle: Posterior Tibial: [Left:Posterior Tibial: 1.36] [Right:1.27] Toe Nail Assessment Left: Right: Thick: Yes Yes Discolored: Yes Yes Deformed: Yes Yes Improper Length and Hygiene: No Electronic Signature(s) Signed: 07/09/2016 4:15:11 PM By: Elliot Gurney, BSN, RN, CWS, Kim RN, BSN Entered By: Elliot Gurney, BSN, RN, CWS, Kim on 07/09/2016 11:07:50 Wellbrock, Irma Newness ([id number]) -------------------------------------------------------------------------------- Multi Wound Chart Details Patient Name: [name] Date of Service: 07/09/2016 10:30 AM Medical Record Number: [id number] Patient Account Number: [id number] Date of Birth/Sex: [birth year] (81 y.o. 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 the Lower Extremity the Lower Extremity Comorbid History: Chronic Obstructive Chronic Obstructive Chronic Obstructive Pulmonary Disease Pulmonary Disease Pulmonary Disease (COPD), Coronary Artery (COPD), Coronary Artery (COPD), Coronary Artery Disease, Hypertension, Disease, Hypertension, Disease, Hypertension, Type II Diabetes, End Type II Diabetes, End Type II Diabetes, End Stage Renal Disease, Stage Renal Disease, Stage Renal Disease, Neuropathy Neuropathy Neuropathy Date Acquired: 06/25/2016 06/25/2016 06/25/2016 Weeks of Treatment: 0 0 0 Wound Status:  Open Open Open Measurements L x W x D 1x0.5x0.3 0.5x0.5x0.6 0.5x0.6x0.5 (cm) Area (cm) : 0.393 0.196 0.236 Volume (cm) : 0.118 0.118 0.118 % Reduction in Area: 0.00% N/A N/A % Reduction in Volume: 0.00% N/A N/A Starting Position 1 12 (o'clock): Ending Position 1 12 (o'clock): Proby, Odean E. ([id number]) Maximum Distance 1 1.2 (cm): Undermining: N/A No Yes Classification: Grade 2 Grade 2 Grade 2 Exudate Amount: None Present None Present Large Exudate Type: N/A N/A Serous Exudate Color: N/A N/A amber Wound Margin: Flat and Intact Flat and Intact Distinct, outline attached Granulation Amount: N/A None Present (0%) None Present (0%) Granulation Quality: N/A N/A N/A Necrotic Amount: N/A None Present (0%) Large (67-100%) Necrotic Tissue: N/A N/A Eschar Exposed  Structures: Fascia: No Fascia: No Fat Layer (Subcutaneous Fat Layer (Subcutaneous Fat Layer (Subcutaneous Tissue) Exposed: Yes Tissue) Exposed: No Tissue) Exposed: No Fascia: No Tendon: No Tendon: No Tendon: No Muscle: No Muscle: No Muscle: No Joint: No Joint: No Joint: No Bone: No Bone: No Bone: No Epithelialization: None None None Debridement: Debridement ([id number]- Debridement (62130- Debridement (11042- 11047) 11047) 11047) Pre-procedure 11:32 11:32 11:32 Verification/Time Out Taken: Pain Control: Lidocaine 4% Topical Lidocaine 4% Topical Lidocaine 4% Topical Solution Solution Solution Tissue Debrided: Fibrin/Slough, Skin, Fibrin/Slough, Skin, Fibrin/Slough, Skin, Subcutaneous Subcutaneous Subcutaneous Level: Skin/Subcutaneous Skin/Subcutaneous Skin/Subcutaneous Tissue Tissue Tissue Debridement Area (sq 0.15 0.25 0.3 cm): Instrument: Forceps, Scissors Forceps, Scissors Forceps, Scissors Bleeding: Minimum Minimum Minimum Hemostasis Achieved: Pressure Pressure Pressure Procedural Pain: 0 0 0 Post Procedural Pain: 0 0 0 Debridement Treatment Procedure was tolerated Procedure was tolerated Procedure was tolerated Response: well well well Post Debridement 2.5x1x0.3 2x2.5x0.2 3.5x4x0.2 Measurements L x W x D (cm) Post Debridement 0.589 0.785 2.199 Volume: (cm) Periwound Skin Texture: No Abnormalities Noted Callus: Yes Excoriation: No Induration: No Callus: No Crepitus: No KIMMIE, DOREN. ([id number]) Rash: No Scarring: No Periwound Skin No Abnormalities Noted No Abnormalities Noted Maceration: No Moisture: Dry/Scaly: No Periwound Skin Color: Ecchymosis: Yes Ecchymosis: Yes Ecchymosis: Yes Atrophie Blanche: No Cyanosis: No Erythema: No Hemosiderin Staining: No Mottled: No Pallor: No Rubor: No Tenderness on No No No Palpation: Wound Preparation: Ulcer Cleansing: Ulcer Cleansing: Ulcer Cleansing: Rinsed/Irrigated with Rinsed/Irrigated with  Rinsed/Irrigated with Saline Saline Saline Topical Anesthetic Topical Anesthetic Topical Anesthetic Applied: Other: lidocaine Applied: Other: lidocaine Applied: Other 4% 4% Procedures Performed: Debridement Debridement Debridement Wound Number: 7 N/A N/A Photos: No Photos N/A N/A Wound Location: Right Foot - Lateral N/A N/A Wounding Event: Gradually Appeared N/A N/A Primary Etiology: 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 Comorbid History: Chronic Obstructive N/A N/A Pulmonary Disease (COPD), Coronary Artery Disease, Hypertension, Type II Diabetes, End Stage Renal Disease, Neuropathy Date Acquired: 07/02/2016 N/A N/A Weeks of Treatment: 0 N/A N/A Wound Status: Open N/A N/A Measurements L x W x D 1.5x1.2x0.3 N/A N/A (cm) Area (cm) : 1.414 N/A N/A Volume (cm) : 0.424 N/A N/A % Reduction in Area: N/A N/A N/A % Reduction in Volume: N/A N/A N/A Undermining: No N/A N/A Classification: Grade 2 N/A N/A Exudate Amount: Medium N/A N/A Exudate Type: Serous N/A N/A Exudate Color: amber N/A N/A Saltos, Noble E. ([id number]) Wound Margin: Flat and Intact N/A N/A Granulation Amount: Small (1-33%) N/A N/A Granulation Quality: Pink N/A N/A Necrotic Amount: Large (67-100%) N/A N/A Necrotic Tissue: Adherent Slough N/A N/A Exposed Structures: Fat Layer (Subcutaneous N/A N/A Tissue) Exposed: Yes Fascia: No Tendon: No Muscle: No Joint: No Bone: No Epithelialization: None N/A N/A Debridement: Debridement (44010- N/A N/A 11047) Pre-procedure 11:32  N/A N/A Verification/Time Out Taken: Pain Control: Lidocaine 4% Topical N/A N/A Solution Tissue Debrided: Skin, Subcutaneous N/A N/A Level: Skin/Subcutaneous N/A N/A Tissue Debridement Area (sq 1.8 N/A N/A cm): Instrument: Forceps, Scissors N/A N/A Bleeding: Minimum N/A N/A Hemostasis Achieved: Pressure N/A N/A Procedural Pain: 0 N/A N/A Post Procedural Pain: 0 N/A N/A Debridement Treatment Procedure was tolerated  N/A N/A Response: well Post Debridement 1.5x1.2x0.3 N/A N/A Measurements L x W x D (cm) Post Debridement 0.424 N/A N/A Volume: (cm) Periwound Skin Texture: Excoriation: No N/A N/A Induration: No Callus: No Crepitus: No Rash: No Scarring: No Periwound Skin Maceration: No N/A N/A Moisture: Dry/Scaly: No Periwound Skin Color: Erythema: Yes N/A N/A Atrophie Blanche: No Cyanosis: No Ecchymosis: No Hemosiderin Staining: No Harlacher, Kahner E. ([id number]) Mottled: No Pallor: No Rubor: No Tenderness on No N/A N/A Palpation: Wound Preparation: N/A N/A N/A Procedures Performed: N/A N/A N/A Treatment Notes Electronic Signature(s) Signed: 07/09/2016 12:00:03 PM By: Evlyn Kanner MD, FACS Entered By: Evlyn Kanner on 07/09/2016 12:00:03 [name] ([id number]) -------------------------------------------------------------------------------- Multi-Disciplinary Care Plan Details Patient Name: [name] Date of Service: 07/09/2016 10:30 AM Medical Record Number: [id number] Patient Account Number: [id number] Date of Birth/Sex: 07/08/1934 (81 y.o. Male) Treating RN: Huel Coventry Primary Care Valia Wingard: Baruch Gouty Other Clinician: Referring Akashdeep Chuba: Baruch Gouty Treating Devetta Hagenow/Extender: Rudene Re in Treatment: 0 Active Inactive ` Nutrition Nursing Diagnoses: Impaired glucose control: actual or potential Goals: Patient/caregiver will maintain therapeutic glucose control Date Initiated: 07/09/2016 Target Resolution Date: 07/16/2016 Goal Status: Active Interventions: Assess HgA1c results as ordered upon admission and as needed Treatment Activities: Patient referred to Primary Care Physician for further nutritional evaluation : 07/09/2016 Notes: ` Orientation to the Wound Care Program Nursing Diagnoses: Knowledge deficit related to the wound healing center program Goals: Patient/caregiver will verbalize understanding of the Wound Healing Center  Program Date Initiated: 07/09/2016 Target Resolution Date: 07/16/2016 Goal Status: Active Interventions: Provide education on orientation to the wound center Notes: ` Wound/Skin Impairment Withers, ROBSON TRICKEY ([id number]) Nursing Diagnoses: Impaired tissue integrity Goals: Ulcer/skin breakdown will heal within 14 weeks Date Initiated: 07/09/2016 Target Resolution Date: 10/08/2016 Goal Status: Active Interventions: Assess ulceration(s) every visit Treatment Activities: Topical wound management initiated : 07/09/2016 Notes: Electronic Signature(s) Signed: 07/09/2016 4:15:11 PM By: Elliot Gurney, BSN, RN, CWS, Kim RN, BSN Entered By: Elliot Gurney, BSN, RN, CWS, Kim on 07/09/2016 11:34:10 Ketcham, Irma Newness ([id number]) -------------------------------------------------------------------------------- Pain Assessment Details Patient Name: [name] Date of Service: 07/09/2016 10:30 AM Medical Record Number: [id number] Patient Account Number: [id number] Date of Birth/Sex: 01/22/1935 (81 y.o. Male) Treating RN: Huel Coventry Primary Care Siren Porrata: Baruch Gouty Other Clinician: Referring Carlicia Leavens: Baruch Gouty Treating Cathe Bilger/Extender: Rudene Re in Treatment: 0 Active Problems Location of Pain Severity and Description of Pain Patient Has Paino No Site Locations With Dressing Change: No Pain Management and Medication Current Pain Management: Electronic Signature(s) Signed: 07/09/2016 4:15:11 PM By: Elliot Gurney, BSN, RN, CWS, Kim RN, BSN Entered By: Elliot Gurney, BSN, RN, CWS, Kim on 07/09/2016 10:49:48 Capes, Irma Newness ([id number]) -------------------------------------------------------------------------------- Patient/Caregiver Education Details Patient Name: [name] Date of Service: 07/09/2016 10:30 AM Medical Record Number: [id number] Patient Account Number: [id number] Date of Birth/Gender: 1934-11-19 (81 y.o. Male) Treating RN: Huel Coventry Primary Care Physician: Baruch Gouty Other Clinician: Referring Physician: Baruch Gouty Treating Physician/Extender: Rudene Re in Treatment: 0 Education Assessment Education Provided To: Patient Education Topics Provided Welcome To The Wound Care Center: Handouts: Welcome To The Wound Care Center Methods: Demonstration, Explain/Verbal Responses: State content correctly  Wound/Skin Impairment: Handouts: Caring for Your Ulcer Methods: Demonstration, Explain/Verbal Responses: State content correctly Electronic Signature(s) Signed: 07/09/2016 4:15:11 PM By: Elliot GurneyWoody, BSN, RN, CWS, Kim RN, BSN Entered By: Elliot GurneyWoody, BSN, RN, CWS, Kim on 07/09/2016 12:15:21 Romero, Irma NewnessARTHUR E. ([id number]) -------------------------------------------------------------------------------- Wound Assessment Details Patient Name: Kenneth GingerFORTUNE, Brittany E. Date of Service: 07/09/2016 10:30 AM Medical Record Number: [id number] Patient Account Number: [id number] Date of Birth/Sex: June 02, 1934 (81 y.o. Male) Treating RN: Huel CoventryWoody, Kim Primary Care Martino Tompson: Baruch GoutyLada, Melinda Other Clinician: Referring Audre Cenci: Baruch GoutyLada, Melinda Treating Vallory Oetken/Extender: Rudene ReBritto, Errol Weeks in Treatment: 0 Wound Status Wound Number: 4 Primary Diabetic Wound/Ulcer of the Lower Etiology: Extremity Wound Location: Right Metatarsal head first - Plantar Wound Open Status: Wounding Event: Gradually Appeared Comorbid Chronic Obstructive Pulmonary Disease Date Acquired: 06/25/2016 History: (COPD), Coronary Artery Disease, Weeks Of Treatment: 0 Hypertension, Type II Diabetes, End Clustered Wound: No Stage Renal Disease, Neuropathy Photos Wound Measurements Length: (cm) 1 Width: (cm) 0.5 Depth: (cm) 0.3 Area: (cm) 0.393 Volume: (cm) 0.118 % Reduction in Area: 0% % Reduction in Volume: 0% Epithelialization: None Wound Description Classification: Grade 2 Foul Odor Aft Wound Margin: Flat and Intact Slough/Fibrin Exudate Amount: None Present er Cleansing: No o  No Wound Bed Exposed Structure Fascia Exposed: No Fat Layer (Subcutaneous Tissue) Exposed: No Tendon Exposed: No Muscle Exposed: No Joint Exposed: No Bone Exposed: No Sieg, Paton E. ([id number]) Periwound Skin Texture Texture Color No Abnormalities Noted: No No Abnormalities Noted: No Ecchymosis: Yes Moisture No Abnormalities Noted: No Wound Preparation Ulcer Cleansing: Rinsed/Irrigated with Saline Topical Anesthetic Applied: Other: lidocaine 4%, Treatment Notes Wound #4 (Right, Plantar Metatarsal head first) 1. Cleansed with: Clean wound with Normal Saline 2. Anesthetic Topical Lidocaine 4% cream to wound bed prior to debridement 4. Dressing Applied: Aquacel Ag 5. Secondary Dressing Applied ABD Pad Kerlix/Conform 7. Secured with Secretary/administratorTape Electronic Signature(s) Signed: 07/09/2016 4:09:54 PM By: Elliot GurneyWoody, BSN, RN, CWS, Kim RN, BSN Entered By: Elliot GurneyWoody, BSN, RN, CWS, Kim on 07/09/2016 16:09:53 Uselton, Irma NewnessARTHUR E. ([id number]) -------------------------------------------------------------------------------- Wound Assessment Details Patient Name: Kenneth GingerFORTUNE, Melissa E. Date of Service: 07/09/2016 10:30 AM Medical Record Number: [id number] Patient Account Number: [id number] Date of Birth/Sex: June 02, 1934 (81 y.o. Male) Treating RN: Huel CoventryWoody, Kim Primary Care Jasmynn Pfalzgraf: Baruch GoutyLada, Melinda Other Clinician: Referring Bryan Goin: Baruch GoutyLada, Melinda Treating Nanami Whitelaw/Extender: Rudene ReBritto, Errol Weeks in Treatment: 0 Wound Status Wound Number: 5 Primary Diabetic Wound/Ulcer of the Lower Etiology: Extremity Wound Location: Right Metatarsal head third - Plantar Wound Open Status: Wounding Event: Gradually Appeared Comorbid Chronic Obstructive Pulmonary Disease Date Acquired: 06/25/2016 History: (COPD), Coronary Artery Disease, Weeks Of Treatment: 0 Hypertension, Type II Diabetes, End Clustered Wound: No Stage Renal Disease, Neuropathy Photos Wound Measurements Length: (cm) 0.5 Width: (cm) 0.5 Depth:  (cm) 0.6 Area: (cm) 0.196 Volume: (cm) 0.118 % Reduction in Area: 0% % Reduction in Volume: 0% Epithelialization: None Tunneling: No Undermining: No Wound Description Classification: Grade 2 Wound Margin: Flat and Intact Exudate Amount: None Present Wound Bed Granulation Amount: None Present (0%) Exposed Structure Necrotic Amount: None Present (0%) Fascia Exposed: No Fat Layer (Subcutaneous Tissue) Exposed: No Tendon Exposed: No Muscle Exposed: No Joint Exposed: No Bone Exposed: No Carta, Avantae E. ([id number]) Periwound Skin Texture Texture Color No Abnormalities Noted: No No Abnormalities Noted: No Callus: Yes Ecchymosis: Yes Moisture No Abnormalities Noted: No Wound Preparation Ulcer Cleansing: Rinsed/Irrigated with Saline Topical Anesthetic Applied: Other: lidocaine 4%, Treatment Notes Wound #5 (Right, Plantar Metatarsal head third) 1. Cleansed with: Clean wound with Normal Saline 2. Anesthetic Topical Lidocaine 4% cream to wound bed  prior to debridement 4. Dressing Applied: Aquacel Ag 5. Secondary Dressing Applied ABD Pad Kerlix/Conform 7. Secured with Secretary/administrator) Signed: 07/09/2016 4:11:03 PM By: Elliot Gurney, BSN, RN, CWS, Kim RN, BSN Entered By: Elliot Gurney, BSN, RN, CWS, Kim on 07/09/2016 16:11:03 Hakes, Irma Newness ([id number]) -------------------------------------------------------------------------------- Wound Assessment Details Patient Name: [name] Date of Service: 07/09/2016 10:30 AM Medical Record Number: [id number] Patient Account Number: [id number] Date of Birth/Sex: Sep 13, 1934 (81 y.o. Male) Treating RN: Huel Coventry Primary Care Brenson Hartman: Baruch Gouty Other Clinician: Referring Laelah Siravo: Baruch Gouty Treating Bryant Lipps/Extender: Rudene Re in Treatment: 0 Wound Status Wound Number: 6 Primary Diabetic Wound/Ulcer of the Lower Etiology: Extremity Wound Location: Left Metatarsal head fifth - Plantar Wound  Open Status: Wounding Event: Gradually Appeared Comorbid Chronic Obstructive Pulmonary Disease Date Acquired: 06/25/2016 History: (COPD), Coronary Artery Disease, Weeks Of Treatment: 0 Hypertension, Type II Diabetes, End Clustered Wound: No Stage Renal Disease, Neuropathy Photos Wound Measurements Length: (cm) 0.5 Width: (cm) 0.6 Depth: (cm) 0.5 Area: (cm) 0.236 Volume: (cm) 0.118 % Reduction in Area: 0% % Reduction in Volume: 0% Epithelialization: None Tunneling: No Undermining: Yes Starting Position (o'clock): 12 Ending Position (o'clock): 12 Maximum Distance: (cm) 1.2 Wound Description Classification: Grade 2 Foul Odor Afte Wound Margin: Distinct, outline attached Slough/Fibrino Exudate Amount: Large Exudate Type: Serous Exudate Color: amber r Cleansing: No No Wound Bed Granulation Amount: None Present (0%) Exposed Structure Necrotic Amount: Large (67-100%) Fascia Exposed: No Pridgen, Saurabh E. ([id number]) Necrotic Quality: Eschar Fat Layer (Subcutaneous Tissue) Exposed: Yes Tendon Exposed: No Muscle Exposed: No Joint Exposed: No Bone Exposed: No Periwound Skin Texture Texture Color No Abnormalities Noted: No No Abnormalities Noted: No Callus: No Atrophie Blanche: No Crepitus: No Cyanosis: No Excoriation: No Ecchymosis: Yes Induration: No Erythema: No Rash: No Hemosiderin Staining: No Scarring: No Mottled: No Pallor: No Moisture Rubor: No No Abnormalities Noted: No Dry / Scaly: No Maceration: No Wound Preparation Ulcer Cleansing: Rinsed/Irrigated with Saline Topical Anesthetic Applied: Other Treatment Notes Wound #6 (Left, Plantar Metatarsal head fifth) 1. Cleansed with: Clean wound with Normal Saline 2. Anesthetic Topical Lidocaine 4% cream to wound bed prior to debridement 4. Dressing Applied: Aquacel Ag 5. Secondary Dressing Applied ABD Pad Kerlix/Conform 7. Secured with Secretary/administrator) Signed: 07/09/2016 4:11:29  PM By: Elliot Gurney, BSN, RN, CWS, Kim RN, BSN Entered By: Elliot Gurney, BSN, RN, CWS, Kim on 07/09/2016 16:11:29 Mandarino, Irma Newness ([id number]) -------------------------------------------------------------------------------- Wound Assessment Details Patient Name: [name] Date of Service: 07/09/2016 10:30 AM Medical Record Number: [id number] Patient Account Number: [id number] Date of Birth/Sex: 1934-09-10 (81 y.o. Male) Treating RN: Huel Coventry Primary Care Deleon Passe: Baruch Gouty Other Clinician: Referring Curby Carswell: Baruch Gouty Treating Kerri Kovacik/Extender: Rudene Re in Treatment: 0 Wound Status Wound Number: 7 Primary Diabetic Wound/Ulcer of the Lower Etiology: Extremity Wound Location: Right Foot - Lateral Wound Open Wounding Event: Gradually Appeared Status: Date Acquired: 07/02/2016 Comorbid Chronic Obstructive Pulmonary Disease Weeks Of Treatment: 0 History: (COPD), Coronary Artery Disease, Clustered Wound: No Hypertension, Type II Diabetes, End Stage Renal Disease, Neuropathy Photos Photo Uploaded By: Elliot Gurney, BSN, RN, CWS, Kim on 07/09/2016 16:09:13 Wound Measurements Length: (cm) 1.5 Width: (cm) 1.2 Depth: (cm) 0.3 Area: (cm) 1.414 Volume: (cm) 0.424 % Reduction in Area: % Reduction in Volume: Epithelialization: None Tunneling: No Undermining: No Wound Description Classification: Grade 2 Foul Odor After Wound Margin: Flat and Intact Slough/Fibrino Exudate Amount: Medium Exudate Type: Serous Exudate Color: amber Cleansing: No No Wound Bed Granulation Amount: Small (1-33%) Exposed Structure Granulation Quality: Pink Fascia  Exposed: No Necrotic Amount: Large (67-100%) Fat Layer (Subcutaneous Tissue) Exposed: Yes Necrotic Quality: Adherent Slough Tendon Exposed: No Muscle Exposed: No Hilyard, Egidio E. ([id number]) Joint Exposed: No Bone Exposed: No Periwound Skin Texture Texture Color No Abnormalities Noted: No No Abnormalities Noted:  No Callus: No Atrophie Blanche: No Crepitus: No Cyanosis: No Excoriation: No Ecchymosis: No Induration: No Erythema: Yes Rash: No Hemosiderin Staining: No Scarring: No Mottled: No Pallor: No Moisture Rubor: No No Abnormalities Noted: No Dry / Scaly: No Maceration: No Treatment Notes Wound #7 (Right, Lateral Foot) 1. Cleansed with: Clean wound with Normal Saline 2. Anesthetic Topical Lidocaine 4% cream to wound bed prior to debridement 4. Dressing Applied: Aquacel Ag 5. Secondary Dressing Applied ABD Pad Kerlix/Conform 7. Secured with Secretary/administrator) Signed: 07/09/2016 4:15:11 PM By: Elliot Gurney, BSN, RN, CWS, Kim RN, BSN Entered By: Elliot Gurney, BSN, RN, CWS, Kim on 07/09/2016 11:46:08 Doorn, Irma Newness ([id number]) -------------------------------------------------------------------------------- Vitals Details Patient Name: [name] Date of Service: 07/09/2016 10:30 AM Medical Record Number: [id number] Patient Account Number: [id number] Date of Birth/Sex: 24-Jul-1934 (81 y.o. Male) Treating RN: Huel Coventry Primary Care Terryl Niziolek: Baruch Gouty Other Clinician: Referring Dayvion Sans: Baruch Gouty Treating Daiya Tamer/Extender: Rudene Re in Treatment: 0 Vital Signs Time Taken: 10:49 Temperature (F): 97.6 Height (in): 72 Pulse (bpm): 66 Weight (lbs): 227 Respiratory Rate (breaths/min): 16 Body Mass Index (BMI): 30.8 Blood Pressure (mmHg): 131/59 Reference Range: 80 - 120 mg / dl Electronic Signature(s) Signed: 07/09/2016 4:15:11 PM By: Elliot Gurney, BSN, RN, CWS, Kim RN, BSN Entered By: Elliot Gurney, BSN, RN, CWS, Kim on 07/09/2016 10:50:38

## 2016-07-11 NOTE — Progress Notes (Signed)
[name], [name] ([id number]) Visit Report for 07/09/2016 Abuse/Suicide Risk Screen Details Patient Name: [name], [name] Date of Service: 07/09/2016 10:30 AM Medical Record Number: [id number] Patient Account Number: [id number] Date of Birth/Sex: 24-May-1934 (82 y.o. Male) Treating RN: Huel CoventryWoody, Kim Primary Care Emmelia Holdsworth: Baruch GoutyLada, Melinda Other Clinician: Referring Kielan Dreisbach: Baruch GoutyLada, Melinda Treating Charlotte Fidalgo/Extender: Rudene ReBritto, Errol Weeks in Treatment: 0 Abuse/Suicide Risk Screen Items Answer ABUSE/SUICIDE RISK SCREEN: Has anyone close to you tried to hurt or harm you recentlyo No Do you feel uncomfortable with anyone in your familyo No Has anyone forced you do things that you didnot want to doo No Do you have any thoughts of harming yourselfo No Patient displays signs or symptoms of abuse and/or neglect. No Electronic Signature(s) Signed: 07/09/2016 4:15:11 PM By: Elliot GurneyWoody, BSN, RN, CWS, Kim RN, BSN Entered By: Elliot GurneyWoody, BSN, RN, CWS, Kim on 07/09/2016 11:11:28 Chewning, Irma NewnessARTHUR E. ([id number]) -------------------------------------------------------------------------------- Activities of Daily Living Details Patient Name: [name], [name] Date of Service: 07/09/2016 10:30 AM Medical Record Number: [id number] Patient Account Number: [id number] Date of Birth/Sex: 24-May-1934 (82 y.o. Male) Treating RN: Huel CoventryWoody, Kim Primary Care Ariannah Arenson: Baruch GoutyLada, Melinda Other Clinician: Referring Esmerelda Finnigan: Baruch GoutyLada, Melinda Treating Bird Swetz/Extender: Rudene ReBritto, Errol Weeks in Treatment: 0 Activities of Daily Living Items Answer Activities of Daily Living (Please select one for each item) Drive Automobile Need Assistance Take Medications Completely Able Use Telephone Completely Able Care for Appearance Completely Able Use Toilet Completely Able Bath / Shower Completely Able Dress Self Completely Able Feed Self Completely Able Walk Completely Able Get In / Out Bed Completely Able Housework Completely Able Prepare  Meals Completely Able Handle Money Completely Able Shop for Self Completely Able Electronic Signature(s) Signed: 07/09/2016 4:15:11 PM By: Elliot GurneyWoody, BSN, RN, CWS, Kim RN, BSN Entered By: Elliot GurneyWoody, BSN, RN, CWS, Kim on 07/09/2016 11:11:50 Schoonmaker, Irma NewnessARTHUR E. ([id number]) -------------------------------------------------------------------------------- Education Assessment Details Patient Name: [name], [name] Date of Service: 07/09/2016 10:30 AM Medical Record Number: [id number] Patient Account Number: [id number] Date of Birth/Sex: 24-May-1934 (82 y.o. Male) Treating RN: Huel CoventryWoody, Kim Primary Care Marvalene Barrett: Baruch GoutyLada, Melinda Other Clinician: Referring Jonella Redditt: Baruch GoutyLada, Melinda Treating Libertie Hausler/Extender: Rudene ReBritto, Errol Weeks in Treatment: 0 Primary Learner Assessed: Patient Learning Preferences/Education Level/Primary Language Preferred Language: English Cognitive Barrier Assessment/Beliefs Language Barrier: No Translator Needed: No Memory Deficit: No Emotional Barrier: No Cultural/Religious Beliefs Affecting Medical No Care: Physical Barrier Assessment Impaired Vision: No Impaired Hearing: No Decreased Hand dexterity: No Knowledge/Comprehension Assessment Knowledge Level: Medium Comprehension Level: Medium Ability to understand written Medium instructions: Ability to understand verbal Medium instructions: Motivation Assessment Anxiety Level: Calm Cooperation: Cooperative Education Importance: Acknowledges Need Interest in Health Problems: Asks Questions Perception: Coherent Willingness to Engage in Self- High Management Activities: Readiness to Engage in Self- High Management Activities: Electronic Signature(s) Signed: 07/09/2016 4:15:11 PM By: Elliot GurneyWoody, BSN, RN, CWS, Kim RN, BSN Jacquet, MarionARTHUR E. ([id number]) Entered By: Elliot GurneyWoody, BSN, RN, CWS, Kim on 07/09/2016 11:12:53 Bayron, Irma NewnessARTHUR E.  ([id number]) -------------------------------------------------------------------------------- Fall Risk Assessment Details Patient Name: [name], [name] Date of Service: 07/09/2016 10:30 AM Medical Record Number: [id number] Patient Account Number: [id number] Date of Birth/Sex: 24-May-1934 (82 y.o. Male) Treating RN: Huel CoventryWoody, Kim Primary Care Taygen Newsome: Baruch GoutyLada, Melinda Other Clinician: Referring Tya Haughey: Baruch GoutyLada, Melinda Treating Dorothy Landgrebe/Extender: Rudene ReBritto, Errol Weeks in Treatment: 0 Fall Risk Assessment Items Have you had 2 or more falls in the last 12 monthso 0 Yes Have you had any fall that resulted in injury in the last 12 monthso 0 No FALL RISK ASSESSMENT: History of falling - immediate or within 3 months 0 No  Secondary diagnosis 0 No Ambulatory aid None/bed rest/wheelchair/nurse 0 Yes Crutches/cane/walker 0 No Furniture 0 No IV Access/Saline Lock 0 No Gait/Training Normal/bed rest/immobile 0 Yes Weak 0 No Impaired 0 No Mental Status Oriented to own ability 0 Yes Electronic Signature(s) Signed: 07/09/2016 4:15:11 PM By: Elliot Gurney, BSN, RN, CWS, Kim RN, BSN Entered By: Elliot Gurney, BSN, RN, CWS, Kim on 07/09/2016 11:13:06 Orzel, Irma Newness ([id number]) -------------------------------------------------------------------------------- Foot Assessment Details Patient Name: Kenneth Ginger Date of Service: 07/09/2016 10:30 AM Medical Record Number: [id number] Patient Account Number: [id number] Date of Birth/Sex: 01/04/1935 (82 y.o. Male) Treating RN: Huel Coventry Primary Care Tim Corriher: Baruch Gouty Other Clinician: Referring Colbin Jovel: Baruch Gouty Treating Paytyn Mesta/Extender: Rudene Re in Treatment: 0 Foot Assessment Items Site Locations + = Sensation present, - = Sensation absent, C = Callus, U = Ulcer R = Redness, W = Warmth, M = Maceration, PU = Pre-ulcerative lesion F = Fissure, S = Swelling, D = Dryness Assessment Right: Left: Other Deformity: No No Prior Foot Ulcer: No  No Prior Amputation: No No Charcot Joint: No No Ambulatory Status: Ambulatory Without Help Gait: Steady Electronic Signature(s) Signed: 07/09/2016 4:15:11 PM By: Elliot Gurney, BSN, RN, CWS, Kim RN, BSN Entered By: Elliot Gurney, BSN, RN, CWS, Kim on 07/09/2016 11:14:48 Umbaugh, Irma Newness ([id number]) -------------------------------------------------------------------------------- Nutrition Risk Assessment Details Patient Name: Kenneth Ginger Date of Service: 07/09/2016 10:30 AM Medical Record Number: [id number] Patient Account Number: [id number] Date of Birth/Sex: Sep 04, 1934 (82 y.o. Male) Treating RN: Huel Coventry Primary Care Divon Krabill: Baruch Gouty Other Clinician: Referring Maisey Deandrade: Baruch Gouty Treating Osmar Howton/Extender: Rudene Re in Treatment: 0 Height (in): 72 Weight (lbs): 227 Body Mass Index (BMI): 30.8 Nutrition Risk Assessment Items NUTRITION RISK SCREEN: I have an illness or condition that made me change the kind and/or 0 No amount of food I eat I eat fewer than two meals per day 0 No I eat few fruits and vegetables, or milk products 0 No I have three or more drinks of beer, liquor or wine almost every day 0 No I have tooth or mouth problems that make it hard for me to eat 0 No I don't always have enough money to buy the food I need 0 No I eat alone most of the time 0 No I take three or more different prescribed or over-the-counter drugs a 0 No day Without wanting to, I have lost or gained 10 pounds in the last six 0 No months I am not always physically able to shop, cook and/or feed myself 0 No Nutrition Protocols Good Risk Protocol 0 No interventions needed Moderate Risk Protocol Electronic Signature(s) Signed: 07/09/2016 4:15:11 PM By: Elliot Gurney, BSN, RN, CWS, Kim RN, BSN Entered By: Elliot Gurney, BSN, RN, CWS, Kim on 07/09/2016 11:13:12

## 2016-07-13 ENCOUNTER — Telehealth: Payer: Self-pay | Admitting: Family Medicine

## 2016-07-13 ENCOUNTER — Ambulatory Visit: Payer: Self-pay | Admitting: Family Medicine

## 2016-07-13 NOTE — Telephone Encounter (Signed)
Pt notified and understands

## 2016-07-13 NOTE — Telephone Encounter (Signed)
I was seeing Mr. [name] back today for his foot ulcers; however, now that he is going to the wound care center, today's visit can be canceled and he can keep f/u with wound care; thank you

## 2016-07-16 ENCOUNTER — Ambulatory Visit: Payer: Medicare Other | Admitting: Surgery

## 2016-07-19 ENCOUNTER — Other Ambulatory Visit: Payer: Self-pay | Admitting: Surgery

## 2016-07-19 ENCOUNTER — Ambulatory Visit: Payer: Self-pay | Admitting: Oncology

## 2016-07-19 ENCOUNTER — Encounter: Payer: Medicare Other | Attending: Surgery | Admitting: Surgery

## 2016-07-19 ENCOUNTER — Ambulatory Visit
Admission: RE | Admit: 2016-07-19 | Discharge: 2016-07-19 | Disposition: A | Discharge status: Home / Self Care | Payer: Medicare Other | Source: Ambulatory Visit | Attending: Surgery | Admitting: Surgery

## 2016-07-19 DIAGNOSIS — E119 Type 2 diabetes mellitus without complications: Secondary | ICD-10-CM | POA: Insufficient documentation

## 2016-07-19 DIAGNOSIS — E785 Hyperlipidemia, unspecified: Secondary | ICD-10-CM | POA: Diagnosis not present

## 2016-07-19 DIAGNOSIS — I2510 Atherosclerotic heart disease of native coronary artery without angina pectoris: Secondary | ICD-10-CM | POA: Diagnosis not present

## 2016-07-19 DIAGNOSIS — S81802A Unspecified open wound, left lower leg, initial encounter: Secondary | ICD-10-CM | POA: Insufficient documentation

## 2016-07-19 DIAGNOSIS — I255 Ischemic cardiomyopathy: Secondary | ICD-10-CM | POA: Diagnosis not present

## 2016-07-19 DIAGNOSIS — E1122 Type 2 diabetes mellitus with diabetic chronic kidney disease: Secondary | ICD-10-CM | POA: Insufficient documentation

## 2016-07-19 DIAGNOSIS — Z79899 Other long term (current) drug therapy: Secondary | ICD-10-CM | POA: Insufficient documentation

## 2016-07-19 DIAGNOSIS — I132 Hypertensive heart and chronic kidney disease with heart failure and with stage 5 chronic kidney disease, or end stage renal disease: Secondary | ICD-10-CM | POA: Diagnosis not present

## 2016-07-19 DIAGNOSIS — K219 Gastro-esophageal reflux disease without esophagitis: Secondary | ICD-10-CM | POA: Insufficient documentation

## 2016-07-19 DIAGNOSIS — Z951 Presence of aortocoronary bypass graft: Secondary | ICD-10-CM | POA: Insufficient documentation

## 2016-07-19 DIAGNOSIS — E11621 Type 2 diabetes mellitus with foot ulcer: Secondary | ICD-10-CM | POA: Insufficient documentation

## 2016-07-19 DIAGNOSIS — L97512 Non-pressure chronic ulcer of other part of right foot with fat layer exposed: Secondary | ICD-10-CM | POA: Insufficient documentation

## 2016-07-19 DIAGNOSIS — Z85038 Personal history of other malignant neoplasm of large intestine: Secondary | ICD-10-CM | POA: Diagnosis not present

## 2016-07-19 DIAGNOSIS — M869 Osteomyelitis, unspecified: Secondary | ICD-10-CM

## 2016-07-19 DIAGNOSIS — I509 Heart failure, unspecified: Secondary | ICD-10-CM | POA: Diagnosis not present

## 2016-07-19 DIAGNOSIS — Z7982 Long term (current) use of aspirin: Secondary | ICD-10-CM | POA: Insufficient documentation

## 2016-07-19 DIAGNOSIS — L97522 Non-pressure chronic ulcer of other part of left foot with fat layer exposed: Secondary | ICD-10-CM | POA: Diagnosis not present

## 2016-07-19 DIAGNOSIS — N186 End stage renal disease: Secondary | ICD-10-CM | POA: Diagnosis not present

## 2016-07-19 DIAGNOSIS — Z885 Allergy status to narcotic agent status: Secondary | ICD-10-CM | POA: Insufficient documentation

## 2016-07-19 DIAGNOSIS — Z950 Presence of cardiac pacemaker: Secondary | ICD-10-CM | POA: Diagnosis not present

## 2016-07-19 DIAGNOSIS — J449 Chronic obstructive pulmonary disease, unspecified: Secondary | ICD-10-CM | POA: Insufficient documentation

## 2016-07-19 NOTE — Progress Notes (Signed)
Kenneth, Pittman ([id number]) Visit Report for 07/19/2016 Arrival Information Details Patient Name: [name], [name] Date of Service: 07/19/2016 2:30 PM Medical Record Number: [id number] Patient Account Number: [id number] Date of Birth/Sex: 05-25-1934 (82 y.o. Male) Treating RN: Phillis Haggis Primary Care Tramell Piechota: Baruch Gouty Other Clinician: Referring Dajohn Ellender: Baruch Gouty Treating Josecarlos Harriott/Extender: Rudene Re in Treatment: 1 Visit Information History Since Last Visit All ordered tests and consults were completed: No Patient Arrived: Ambulatory Added or deleted any medications: No Arrival Time: 14:36 Any new allergies or adverse reactions: No Accompanied By: self Had a fall or experienced change in No Transfer Assistance: EasyPivot Patient activities of daily living that may affect Lift risk of falls: Patient Identification Verified:Yes Signs or symptoms of abuse/neglect since last No Secondary Verification Process Yes visito Completed: Hospitalized since last visit: No Patient Requires Transmission- No Has Dressing in Place as Prescribed: Yes Based Precautions: Pain Present Now: No Patient Has Alerts: Yes Patient Alerts: Patient on Blood Thinner Type II Diabetic Electronic Signature(s) Signed: 07/19/2016 4:05:23 PM By: Alejandro Mulling Entered By: Alejandro Mulling on 07/19/2016 14:41:52 Hoskie, Irma Newness ([id number]) -------------------------------------------------------------------------------- Clinic Level of Care Assessment Details Patient Name: [name] Date of Service: 07/19/2016 2:30 PM Medical Record Number: [id number] Patient Account Number: [id number] Date of Birth/Sex: Jul 21, 1934 (82 y.o. Male) Treating RN: Phillis Haggis Primary Care Amahia Madonia: Baruch Gouty Other Clinician: Referring Kanna Dafoe: Baruch Gouty Treating Nadir Vasques/Extender: Rudene Re in Treatment: 1 Clinic Level of Care Assessment Items TOOL 4 Quantity  Score X - Use when only an EandM is performed on FOLLOW-UP visit 1 0 ASSESSMENTS - Nursing Assessment / Reassessment X - Reassessment of Co-morbidities (includes updates in patient status) 1 10 X - Reassessment of Adherence to Treatment Plan 1 5 ASSESSMENTS - Wound and Skin Assessment / Reassessment []  - Simple Wound Assessment / Reassessment - one wound 0 X - Complex Wound Assessment / Reassessment - multiple wounds 4 5 []  - Dermatologic / Skin Assessment (not related to wound area) 0 ASSESSMENTS - Focused Assessment []  - Circumferential Edema Measurements - multi extremities 0 []  - Nutritional Assessment / Counseling / Intervention 0 []  - Lower Extremity Assessment (monofilament, tuning fork, pulses) 0 []  - Peripheral Arterial Disease Assessment (using hand held doppler) 0 ASSESSMENTS - Ostomy and/or Continence Assessment and Care []  - Incontinence Assessment and Management 0 []  - Ostomy Care Assessment and Management (repouching, etc.) 0 PROCESS - Coordination of Care []  - Simple Patient / Family Education for ongoing care 0 X - Complex (extensive) Patient / Family Education for ongoing care 1 20 X - Staff obtains Chiropractor, Records, Test Results / Process Orders 1 10 X - Staff telephones HHA, Nursing Homes / Clarify orders / etc 1 10 []  - Routine Transfer to another Facility (non-emergent condition) 0 Fifer, KLYE BESECKER ([id number]) []  - Routine Hospital Admission (non-emergent condition) 0 []  - New Admissions / Manufacturing engineer / Ordering NPWT, Apligraf, etc. 0 []  - Emergency Hospital Admission (emergent condition) 0 X - Simple Discharge Coordination 1 10 []  - Complex (extensive) Discharge Coordination 0 PROCESS - Special Needs []  - Pediatric / Minor Patient Management 0 []  - Isolation Patient Management 0 []  - Hearing / Language / Visual special needs 0 []  - Assessment of Community assistance (transportation, D/C planning, etc.) 0 []  - Additional assistance / Altered  mentation 0 []  - Support Surface(s) Assessment (bed, cushion, seat, etc.) 0 INTERVENTIONS - Wound Cleansing / Measurement []  - Simple Wound Cleansing - one wound 0 X - Complex  Wound Cleansing - multiple wounds 4 5 X - Wound Imaging (photographs - any number of wounds) 1 5 []  - Wound Tracing (instead of photographs) 0 []  - Simple Wound Measurement - one wound 0 X - Complex Wound Measurement - multiple wounds 4 5 INTERVENTIONS - Wound Dressings []  - Small Wound Dressing one or multiple wounds 0 X - Medium Wound Dressing one or multiple wounds 2 15 []  - Large Wound Dressing one or multiple wounds 0 X - Application of Medications - topical 1 5 []  - Application of Medications - injection 0 INTERVENTIONS - Miscellaneous []  - External ear exam 0 Fennell, Silvestre E. ([id number]) []  - Specimen Collection (cultures, biopsies, blood, body fluids, etc.) 0 []  - Specimen(s) / Culture(s) sent or taken to Lab for analysis 0 []  - Patient Transfer (multiple staff / Michiel Sites Lift / Similar devices) 0 []  - Simple Staple / Suture removal (25 or less) 0 []  - Complex Staple / Suture removal (26 or more) 0 []  - Hypo / Hyperglycemic Management (close monitor of Blood Glucose) 0 []  - Ankle / Brachial Index (ABI) - do not check if billed separately 0 X - Vital Signs 1 5 Has the patient been seen at the hospital within the last three years: Yes Total Score: 170 Level Of Care: New/Established - Level 5 Electronic Signature(s) Signed: 07/19/2016 4:05:23 PM By: Alejandro Mulling Entered By: Alejandro Mulling on 07/19/2016 15:51:57 Girardot, Irma Newness ([id number]) -------------------------------------------------------------------------------- Encounter Discharge Information Details Patient Name: [name] Date of Service: 07/19/2016 2:30 PM Medical Record Number: [id number] Patient Account Number: [id number] Date of Birth/Sex: April 23, 1934 (82 y.o. Male) Treating RN: Phillis Haggis Primary Care Taegan Haider: Baruch Gouty Other Clinician: Referring Nasser Ku: Baruch Gouty Treating Holy Battenfield/Extender: Rudene Re in Treatment: 1 Encounter Discharge Information Items Discharge Pain Level: 0 Discharge Condition: Stable Ambulatory Status: Ambulatory Discharge Destination: Home Private Transportation: Auto Accompanied By: self Schedule Follow-up Appointment: Yes Medication Reconciliation completed and No provided to Patient/Care Danai Gotto: Clinical Summary of Care: Electronic Signature(s) Signed: 07/19/2016 4:05:23 PM By: Alejandro Mulling Entered By: Alejandro Mulling on 07/19/2016 15:11:36 Divelbiss, Irma Newness ([id number]) -------------------------------------------------------------------------------- Lower Extremity Assessment Details Patient Name: [name] Date of Service: 07/19/2016 2:30 PM Medical Record Number: [id number] Patient Account Number: [id number] Date of Birth/Sex: 19-Jul-1934 (82 y.o. Male) Treating RN: Phillis Haggis Primary Care Aryannah Mohon: Baruch Gouty Other Clinician: Referring Tanaka Gillen: Baruch Gouty Treating Stephania Macfarlane/Extender: Rudene Re in Treatment: 1 Vascular Assessment Pulses: Dorsalis Pedis Palpable: [Left:Yes] [Right:Yes] Posterior Tibial Extremity colors, hair growth, and conditions: Extremity Color: [Left:Normal] [Right:Normal] Temperature of Extremity: [Left:Warm] [Right:Warm] Capillary Refill: [Left:< 3 seconds] [Right:< 3 seconds] Toe Nail Assessment Left: Right: Thick: Yes Yes Discolored: Yes Yes Deformed: No No Improper Length and Hygiene: Yes Yes Electronic Signature(s) Signed: 07/19/2016 4:05:23 PM By: Alejandro Mulling Entered By: Alejandro Mulling on 07/19/2016 15:44:58 Coakley, Irma Newness ([id number]) -------------------------------------------------------------------------------- Multi Wound Chart Details Patient Name: [name] Date of Service: 07/19/2016 2:30 PM Medical Record Number: [id number] Patient Account  Number: [id number] Date of Birth/Sex: [birth year] (82 y.o. Male) Treating RN: Phillis Haggis Primary Care Audelia Knape: Baruch Gouty Other Clinician: Referring Islam Villescas: Baruch Gouty Treating Osman Calzadilla/Extender: Rudene Re in Treatment: 1 Vital Signs Height(in): 72 Pulse(bpm): 67 Weight(lbs): 227 Blood Pressure 134/72 (mmHg): Body Mass Index(BMI): 31 Temperature(F): 97.7 Respiratory Rate 20 (breaths/min): Photos: [4:No Photos] [5:No Photos] [6:No Photos] Wound Location: [4:Right Metatarsal head first Right Metatarsal head - Plantar] [5:third - Plantar] [6:Right Metatarsal head fifth - Plantar] Wounding Event: [4:Gradually Appeared] [5:Gradually Appeared] [6:Gradually  Appeared] Primary Etiology: [4: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] [5:the Lower Extremity] [6:Diabetic Wound/Ulcer of the Lower Extremity] Comorbid History: [4:Chronic Obstructive Pulmonary Disease (COPD), Coronary Artery (COPD), Coronary Artery Disease, Hypertension, Type II Diabetes, End Stage Renal Disease, Neuropathy] [5:Chronic Obstructive Pulmonary Disease Disease, Hypertension, Type  II Diabetes, End Stage Renal Disease, Neuropathy] [6:Chronic Obstructive Pulmonary Disease (COPD), Coronary Artery Disease, Hypertension, Type II Diabetes, End Stage Renal Disease, Neuropathy] Date Acquired: [4:06/25/2016] [5:06/25/2016] [6:06/25/2016] Weeks of Treatment: [4:1] [5:1] [6:1] Wound Status: [4:Open] [5:Open] [6:Open] Clustered Wound: [4:No] [5:No] [6:No] Measurements L x W x D 0.5x0.8x0.3 [5:0.9x0.7x0.3] [6:0.2x0.2x0.1] (cm) Area (cm) : [4:0.314] [5:0.495] [6:0.031] Volume (cm) : [4:0.094] [5:0.148] [6:0.003] % Reduction in Area: [4:20.10%] [5:-152.60%] [6:N/A] % Reduction in Volume: 20.30% [5:-25.40%] [6:N/A] Classification: [4:Grade 2] [5:Grade 2] [6:Grade 1] Exudate Amount: [4:Large] [5:Large] [6:Large] Exudate Type: [4:Serous] [5:Serous] [6:Serous] Exudate Color: [4:amber]  [5:amber] [6:amber] Wound Margin: [4:Flat and Intact] [5:Flat and Intact] [6:Distinct, outline attached] Granulation Amount: [4:Medium (34-66%)] [5:Medium (34-66%)] [6:Medium (34-66%)] Granulation Quality: [4:Red] [5:Red] [6:Red] Necrotic Amount: Medium (34-66%) Medium (34-66%) Medium (34-66%) Exposed Structures: Fascia: No Fascia: No N/A Fat Layer (Subcutaneous Fat Layer (Subcutaneous Tissue) Exposed: No Tissue) Exposed: No Tendon: No Tendon: No Muscle: No Muscle: No Joint: No Joint: No Bone: No Bone: No Epithelialization: None None None Periwound Skin Texture: No Abnormalities Noted Callus: Yes No Abnormalities Noted Periwound Skin Maceration: Yes Maceration: Yes Maceration: Yes Moisture: Periwound Skin Color: Ecchymosis: Yes Ecchymosis: Yes No Abnormalities Noted Temperature: No Abnormality No Abnormality No Abnormality Tenderness on Yes Yes Yes Palpation: Wound Preparation: Ulcer Cleansing: Ulcer Cleansing: Ulcer Cleansing: Rinsed/Irrigated with Rinsed/Irrigated with Rinsed/Irrigated with Saline Saline Saline Topical Anesthetic Topical Anesthetic Topical Anesthetic Applied: Other: lidocaine Applied: Other: lidocaine Applied: Other: lidocaine 4% 4% 4% Wound Number: 7 N/A N/A Photos: No Photos N/A N/A Wound Location: Left, Plantar Metatarsal N/A N/A head first Wounding Event: Gradually Appeared N/A N/A Primary Etiology: 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 Comorbid History: Chronic Obstructive N/A N/A Pulmonary Disease (COPD), Coronary Artery Disease, Hypertension, Type II Diabetes, End Stage Renal Disease, Neuropathy Date Acquired: 07/02/2016 N/A N/A Weeks of Treatment: 1 N/A N/A Wound Status: Open N/A N/A Clustered Wound: Yes N/A N/A Measurements L x W x D 3.3x1.2x0.8 N/A N/A (cm) Area (cm) : 3.11 N/A N/A Volume (cm) : 2.488 N/A N/A % Reduction in Area: -119.90% N/A N/A % Reduction in Volume: -486.80% N/A N/A Classification: Grade 2  N/A N/A Exudate Amount: Large N/A N/A Sage, Demosthenes E. ([id number]) Exudate Type: Serous N/A N/A Exudate Color: amber N/A N/A Wound Margin: Flat and Intact N/A N/A Granulation Amount: Small (1-33%) N/A N/A Granulation Quality: Pink N/A N/A Necrotic Amount: Large (67-100%) N/A N/A Exposed Structures: Fat Layer (Subcutaneous N/A N/A Tissue) Exposed: Yes Fascia: No Tendon: No Muscle: No Joint: No Bone: No Epithelialization: None N/A N/A Periwound Skin Texture: Excoriation: No N/A N/A Induration: No Callus: No Crepitus: No Rash: No Scarring: No Periwound Skin Maceration: Yes N/A N/A Moisture: Dry/Scaly: No Periwound Skin Color: Erythema: Yes N/A N/A Atrophie Blanche: No Cyanosis: No Ecchymosis: No Hemosiderin Staining: No Mottled: No Pallor: No Rubor: No Temperature: No Abnormality N/A N/A Tenderness on Yes N/A N/A Palpation: Wound Preparation: Ulcer Cleansing: N/A N/A Rinsed/Irrigated with Saline Topical Anesthetic Applied: Other: lidocaine 4% Treatment Notes Electronic Signature(s) Signed: 07/19/2016 3:23:51 PM By: Evlyn Kanner MD, FACS Entered By: Evlyn Kanner on 07/19/2016 15:23:50 Minkoff, Irma Newness ([id number]) -------------------------------------------------------------------------------- Multi-Disciplinary Care Plan Details Patient Name: [name] Date  of Service: 07/19/2016 2:30 PM Medical Record Number: [id number] Patient Account Number: [id number] Date of Birth/Sex: 08-02-1934 [age] y.o. Male) Treating RN: Phillis HaggisPinkerton, Debi Primary Care Anaisabel Pederson: Baruch GoutyLada, Melinda Other Clinician: Referring Carly Applegate: Baruch GoutyLada, Melinda Treating Shirel Mallis/Extender: Rudene ReBritto, Errol Weeks in Treatment: 1 Active Inactive ` Nutrition Nursing Diagnoses: Impaired glucose control: actual or potential Goals: Patient/caregiver will maintain therapeutic glucose control Date Initiated: 07/09/2016 Target Resolution Date: 07/16/2016 Goal Status: Active Interventions: Assess HgA1c  results as ordered upon admission and as needed Treatment Activities: Patient referred to Primary Care Physician for further nutritional evaluation : 07/09/2016 Notes: ` Orientation to the Wound Care Program Nursing Diagnoses: Knowledge deficit related to the wound healing center program Goals: Patient/caregiver will verbalize understanding of the Wound Healing Center Program Date Initiated: 07/09/2016 Target Resolution Date: 07/16/2016 Goal Status: Active Interventions: Provide education on orientation to the wound center Notes: ` Wound/Skin Impairment Kenneth GingerFORTUNE, Chrishon E. ([id number]) Nursing Diagnoses: Impaired tissue integrity Goals: Ulcer/skin breakdown will heal within 14 weeks Date Initiated: 07/09/2016 Target Resolution Date: 10/08/2016 Goal Status: Active Interventions: Assess ulceration(s) every visit Treatment Activities: Topical wound management initiated : 07/09/2016 Notes: Electronic Signature(s) Signed: 07/19/2016 4:05:23 PM By: Alejandro MullingPinkerton, Debra Entered By: Alejandro MullingPinkerton, Debra on 07/19/2016 15:00:27 Kaman, Irma NewnessARTHUR E. ([id number]) -------------------------------------------------------------------------------- Pain Assessment Details Patient Name: Kenneth GingerFORTUNE, Raford E. Date of Service: 07/19/2016 2:30 PM Medical Record Number: [id number] Patient Account Number: [id number] Date of Birth/Sex: 08-02-1934 (82 y.o. Male) Treating RN: Phillis HaggisPinkerton, Debi Primary Care Valdez Brannan: Baruch GoutyLada, Melinda Other Clinician: Referring Deloras Reichard: Baruch GoutyLada, Melinda Treating Camerin Ladouceur/Extender: Rudene ReBritto, Errol Weeks in Treatment: 1 Active Problems Location of Pain Severity and Description of Pain Patient Has Paino No Site Locations With Dressing Change: No Pain Management and Medication Current Pain Management: Electronic Signature(s) Signed: 07/19/2016 4:05:23 PM By: Alejandro MullingPinkerton, Debra Entered By: Alejandro MullingPinkerton, Debra on 07/19/2016 14:41:59 Quinones, Irma NewnessARTHUR E.  ([id number]) -------------------------------------------------------------------------------- Patient/Caregiver Education Details Patient Name: Kenneth GingerFORTUNE, Kaire E. Date of Service: 07/19/2016 2:30 PM Medical Record Number: [id number] Patient Account Number: [id number] Date of Birth/Gender: 08-02-1934 [age] y.o. Male) Treating RN: Phillis HaggisPinkerton, Debi Primary Care Physician: Baruch GoutyLada, Melinda Other Clinician: Referring Physician: Baruch GoutyLada, Melinda Treating Physician/Extender: Rudene ReBritto, Errol Weeks in Treatment: 1 Education Assessment Education Provided To: Patient Education Topics Provided Wound/Skin Impairment: Handouts: Other: change dressing as ordered Methods: Demonstration, Explain/Verbal Responses: State content correctly Electronic Signature(s) Signed: 07/19/2016 4:05:23 PM By: Alejandro MullingPinkerton, Debra Entered By: Alejandro MullingPinkerton, Debra on 07/19/2016 15:11:58 Loughmiller, Irma NewnessARTHUR E. ([id number]) -------------------------------------------------------------------------------- Wound Assessment Details Patient Name: Kenneth GingerFORTUNE, Janet E. Date of Service: 07/19/2016 2:30 PM Medical Record Number: [id number] Patient Account Number: [id number] Date of Birth/Sex: 08-02-1934 [age] y.o. Male) Treating RN: Phillis HaggisPinkerton, Debi Primary Care Lejend Dalby: Baruch GoutyLada, Melinda Other Clinician: Referring Kaydence Menard: Baruch GoutyLada, Melinda Treating Rayson Rando/Extender: Rudene ReBritto, Errol Weeks in Treatment: 1 Wound Status Wound Number: 4 Primary Diabetic Wound/Ulcer of the Lower Etiology: Extremity Wound Location: Right Metatarsal head first - Plantar Wound Open Status: Wounding Event: Gradually Appeared Comorbid Chronic Obstructive Pulmonary Disease Date Acquired: 06/25/2016 History: (COPD), Coronary Artery Disease, Weeks Of Treatment: 1 Hypertension, Type II Diabetes, End Clustered Wound: No Stage Renal Disease, Neuropathy Photos Wound Measurements Length: (cm) 0.5 Width: (cm) 0.8 Depth: (cm) 0.3 Area: (cm) 0.314 Volume: (cm) 0.094 % Reduction in  Area: 20.1% % Reduction in Volume: 20.3% Epithelialization: None Tunneling: No Undermining: No Wound Description Classification: Grade 2 Foul Odor Aft Wound Margin: Flat and Intact Slough/Fibrin Exudate Amount: Large Exudate Type: Serous Exudate Color: amber er Cleansing: No o No Wound Bed Granulation Amount: Medium (34-66%) Exposed Structure Granulation Quality: Red Fascia Exposed: No Necrotic Amount: Medium (  34-66%) Fat Layer (Subcutaneous Tissue) Exposed: No Necrotic Quality: Adherent Slough Tendon Exposed: No Bruni, Camdon E. ([id number]) Muscle Exposed: No Joint Exposed: No Bone Exposed: No Periwound Skin Texture Texture Color No Abnormalities Noted: No No Abnormalities Noted: No Ecchymosis: Yes Moisture No Abnormalities Noted: No Temperature / Pain Maceration: Yes Temperature: No Abnormality Tenderness on Palpation: Yes Wound Preparation Ulcer Cleansing: Rinsed/Irrigated with Saline Topical Anesthetic Applied: Other: lidocaine 4%, Treatment Notes Wound #4 (Right, Plantar Metatarsal head first) 1. Cleansed with: Clean wound with Normal Saline 2. Anesthetic Topical Lidocaine 4% cream to wound bed prior to debridement 4. Dressing Applied: Aquacel Ag 5. Secondary Dressing Applied ABD Pad Dry Gauze Kerlix/Conform 7. Secured with Secretary/administrator) Signed: 07/19/2016 4:05:23 PM By: Alejandro Mulling Entered By: Alejandro Mulling on 07/19/2016 15:44:42 Alred, Irma Newness ([id number]) -------------------------------------------------------------------------------- Wound Assessment Details Patient Name: [name] Date of Service: 07/19/2016 2:30 PM Medical Record Number: [id number] Patient Account Number: [id number] Date of Birth/Sex: 1934/07/20 (82 y.o. Male) Treating RN: Phillis Haggis Primary Care Ambra Haverstick: Baruch Gouty Other Clinician: Referring Zared Knoth: Baruch Gouty Treating Lasonja Lakins/Extender: Rudene Re in Treatment:  1 Wound Status Wound Number: 5 Primary Diabetic Wound/Ulcer of the Lower Etiology: Extremity Wound Location: Right Metatarsal head third - Plantar Wound Open Status: Wounding Event: Gradually Appeared Comorbid Chronic Obstructive Pulmonary Disease Date Acquired: 06/25/2016 History: (COPD), Coronary Artery Disease, Weeks Of Treatment: 1 Hypertension, Type II Diabetes, End Clustered Wound: No Stage Renal Disease, Neuropathy Photos Photo Uploaded By: Alejandro Mulling on 07/19/2016 15:42:46 Wound Measurements Length: (cm) 0.9 Width: (cm) 0.7 Depth: (cm) 0.3 Area: (cm) 0.495 Volume: (cm) 0.148 % Reduction in Area: -152.6% % Reduction in Volume: -25.4% Epithelialization: None Tunneling: No Undermining: No Wound Description Classification: Grade 2 Foul Odor Aft Wound Margin: Flat and Intact Slough/Fibrin Exudate Amount: Large Exudate Type: Serous Exudate Color: amber er Cleansing: No o No Wound Bed Granulation Amount: Medium (34-66%) Exposed Structure Granulation Quality: Red Fascia Exposed: No Necrotic Amount: Medium (34-66%) Fat Layer (Subcutaneous Tissue) Exposed: No Ewalt, Alberta E. ([id number]) Necrotic Quality: Adherent Slough Tendon Exposed: No Muscle Exposed: No Joint Exposed: No Bone Exposed: No Periwound Skin Texture Texture Color No Abnormalities Noted: No No Abnormalities Noted: No Callus: Yes Ecchymosis: Yes Moisture Temperature / Pain No Abnormalities Noted: No Temperature: No Abnormality Maceration: Yes Tenderness on Palpation: Yes Wound Preparation Ulcer Cleansing: Rinsed/Irrigated with Saline Topical Anesthetic Applied: Other: lidocaine 4%, Treatment Notes Wound #5 (Right, Plantar Metatarsal head third) 1. Cleansed with: Clean wound with Normal Saline 2. Anesthetic Topical Lidocaine 4% cream to wound bed prior to debridement 4. Dressing Applied: Aquacel Ag 5. Secondary Dressing Applied ABD Pad Dry Gauze Kerlix/Conform 7. Secured  with Secretary/administrator) Signed: 07/19/2016 4:05:23 PM By: Alejandro Mulling Entered By: Alejandro Mulling on 07/19/2016 14:52:50 Destin, Irma Newness ([id number]) -------------------------------------------------------------------------------- Wound Assessment Details Patient Name: [name] Date of Service: 07/19/2016 2:30 PM Medical Record Number: [id number] Patient Account Number: [id number] Date of Birth/Sex: 1934/06/15 (82 y.o. Male) Treating RN: Phillis Haggis Primary Care Josefina Rynders: Baruch Gouty Other Clinician: Referring Jeanann Balinski: Baruch Gouty Treating Sweetie Giebler/Extender: Rudene Re in Treatment: 1 Wound Status Wound Number: 6 Primary Diabetic Wound/Ulcer of the Lower Etiology: Extremity Wound Location: Right Metatarsal head fifth - Plantar Wound Open Status: Wounding Event: Gradually Appeared Comorbid Chronic Obstructive Pulmonary Disease Date Acquired: 06/25/2016 History: (COPD), Coronary Artery Disease, Weeks Of Treatment: 1 Hypertension, Type II Diabetes, End Clustered Wound: No Stage Renal Disease, Neuropathy Photos Photo Uploaded By: Alejandro Mulling on 07/19/2016 15:44:11 Wound Measurements  Length: (cm) 0.2 Width: (cm) 0.2 Depth: (cm) 0.1 Area: (cm) 0.031 Volume: (cm) 0.003 % Reduction in Area: % Reduction in Volume: Epithelialization: None Tunneling: No Undermining: No Wound Description Classification: Grade 1 Foul Odor Aft Wound Margin: Distinct, outline attached Slough/Fibrin Exudate Amount: Large Exudate Type: Serous Exudate Color: amber er Cleansing: No o No Wound Bed Granulation Amount: Medium (34-66%) Granulation Quality: Red Necrotic Amount: Medium (34-66%) Needles, Trestan E. ([id number]) Necrotic Quality: Adherent Slough Periwound Skin Texture Texture Color No Abnormalities Noted: No No Abnormalities Noted: No Moisture Temperature / Pain No Abnormalities Noted: No Temperature: No Abnormality Maceration:  Yes Tenderness on Palpation: Yes Wound Preparation Ulcer Cleansing: Rinsed/Irrigated with Saline Topical Anesthetic Applied: Other: lidocaine 4%, Treatment Notes Wound #6 (Right, Plantar Metatarsal head fifth) 1. Cleansed with: Clean wound with Normal Saline 2. Anesthetic Topical Lidocaine 4% cream to wound bed prior to debridement 4. Dressing Applied: Aquacel Ag 5. Secondary Dressing Applied ABD Pad Dry Gauze Kerlix/Conform 7. Secured with Secretary/administrator) Signed: 07/19/2016 4:05:23 PM By: Alejandro Mulling Entered By: Alejandro Mulling on 07/19/2016 14:55:17 Merendino, Irma Newness ([id number]) -------------------------------------------------------------------------------- Wound Assessment Details Patient Name: [name] Date of Service: 07/19/2016 2:30 PM Medical Record Number: [id number] Patient Account Number: [id number] Date of Birth/Sex: Jul 13, 1934 (82 y.o. Male) Treating RN: Phillis Haggis Primary Care Chester Romero: Baruch Gouty Other Clinician: Referring Layloni Fahrner: Baruch Gouty Treating Samaiya Awadallah/Extender: Rudene Re in Treatment: 1 Wound Status Wound Number: 7 Primary Diabetic Wound/Ulcer of the Lower Etiology: Extremity Wound Location: Left, Plantar Metatarsal head first Wound Open Status: Wounding Event: Gradually Appeared Comorbid Chronic Obstructive Pulmonary Disease Date Acquired: 07/02/2016 History: (COPD), Coronary Artery Disease, Weeks Of Treatment: 1 Hypertension, Type II Diabetes, End Clustered Wound: Yes Stage Renal Disease, Neuropathy Photos Photo Uploaded By: Alejandro Mulling on 07/19/2016 15:44:12 Wound Measurements Length: (cm) 3.3 Width: (cm) 1.2 Depth: (cm) 0.8 Area: (cm) 3.11 Volume: (cm) 2.488 % Reduction in Area: -119.9% % Reduction in Volume: -486.8% Epithelialization: None Tunneling: No Undermining: No Wound Description Classification: Grade 2 Foul Odor Aft Wound Margin: Flat and Intact  Slough/Fibrin Exudate Amount: Large Exudate Type: Serous Exudate Color: amber er Cleansing: No o No Wound Bed Granulation Amount: Small (1-33%) Exposed Structure Granulation Quality: Pink Fascia Exposed: No Necrotic Amount: Large (67-100%) Fat Layer (Subcutaneous Tissue) Exposed: Yes Roets, Arhaan E. ([id number]) Necrotic Quality: Adherent Slough Tendon Exposed: No Muscle Exposed: No Joint Exposed: No Bone Exposed: No Periwound Skin Texture Texture Color No Abnormalities Noted: No No Abnormalities Noted: No Callus: No Atrophie Blanche: No Crepitus: No Cyanosis: No Excoriation: No Ecchymosis: No Induration: No Erythema: Yes Rash: No Hemosiderin Staining: No Scarring: No Mottled: No Pallor: No Moisture Rubor: No No Abnormalities Noted: No Dry / Scaly: No Temperature / Pain Maceration: Yes Temperature: No Abnormality Tenderness on Palpation: Yes Wound Preparation Ulcer Cleansing: Rinsed/Irrigated with Saline Topical Anesthetic Applied: Other: lidocaine 4%, Treatment Notes Wound #7 (Left, Plantar Metatarsal head first) 1. Cleansed with: Clean wound with Normal Saline 2. Anesthetic Topical Lidocaine 4% cream to wound bed prior to debridement 4. Dressing Applied: Aquacel Ag 5. Secondary Dressing Applied ABD Pad Dry Gauze Kerlix/Conform 7. Secured with Secretary/administrator) Signed: 07/19/2016 4:05:23 PM By: Alejandro Mulling Entered By: Alejandro Mulling on 07/19/2016 15:16:55 Ruberg, Irma Newness ([id number]) -------------------------------------------------------------------------------- Vitals Details Patient Name: [name] Date of Service: 07/19/2016 2:30 PM Medical Record Number: [id number] Patient Account Number: [id number] Date of Birth/Sex: 1934/04/15 (82 y.o. Male) Treating RN: Phillis Haggis Primary Care Berlynn Warsame: Baruch Gouty Other Clinician: Referring Virlee Stroschein: Sherie Don  Melinda Treating Richrd Kuzniar/Extender: Rudene Re in  Treatment: 1 Vital Signs Time Taken: 14:42 Temperature (F): 97.7 Height (in): 72 Pulse (bpm): 67 Weight (lbs): 227 Respiratory Rate (breaths/min): 20 Body Mass Index (BMI): 30.8 Blood Pressure (mmHg): 134/72 Reference Range: 80 - 120 mg / dl Electronic Signature(s) Signed: 07/19/2016 4:05:23 PM By: Alejandro Mulling Entered By: Alejandro Mulling on 07/19/2016 14:42:17

## 2016-07-20 ENCOUNTER — Other Ambulatory Visit: Payer: Self-pay | Admitting: Surgery

## 2016-07-20 DIAGNOSIS — T148XXA Other injury of unspecified body region, initial encounter: Secondary | ICD-10-CM

## 2016-07-23 NOTE — Progress Notes (Signed)
[name], [name] ([id number]) Visit Report for 07/19/2016 Chief Complaint Document Details Patient Name: [name], [name] Date of Service: 07/19/2016 2:30 PM Medical Record Number: [id number] Patient Account Number: [id number] Date of Birth/Sex: 12-21-1934 (82 y.o. Male) Treating RN: Phillis Haggis Primary Care Provider: Baruch Gouty Other Clinician: Referring Provider: Baruch Gouty Treating Provider/Extender: Rudene Re in Treatment: 1 Information Obtained from: Patient Chief Complaint This 82 year old patient is a very poor historian and has no family member with him. He has ulcers both feet which are present for about 2 weeks as per his history but I believe there have been callouses is for longer than that Electronic Signature(s) Signed: 07/19/2016 3:23:58 PM By: Evlyn Kanner MD, FACS Entered By: Evlyn Kanner on 07/19/2016 15:23:57 [name], [name] ([id number]) -------------------------------------------------------------------------------- HPI Details Patient Name: [name] Date of Service: 07/19/2016 2:30 PM Medical Record Number: [id number] Patient Account Number: [id number] Date of Birth/Sex: [birth year] (82 y.o. Male) Treating RN: Phillis Haggis Primary Care Provider: Baruch Gouty Other Clinician: Referring Provider: Baruch Gouty Treating Provider/Extender: Rudene Re in Treatment: 1 History of Present Illness Location: ulcers on both feet Quality: Patient reports No Pain. Severity: Patient states wound are getting worse. Duration: Patient states that they are not certain how long the wound has been present. Context: The wound appeared gradually over time Modifying Factors: Consults to this date include: was seen by his PCP recently. Associated Signs and Symptoms: Patient reports having difficulty standing for long periods. HPI Description: 82 year old patient was recently seen by his PCP Dr. Baruch Gouty, who noted a large tunneling  ulcer and blister on the left foot extending medially near the base of the toe. There were also 2 small ulcers on the right foot. his most recent hemoglobin A1c was 5.8 He has been placed on clindamycin recently He has been treated by her for congestive heart failure and was recently put on clindamycin for the abscesses and ulceration of the foot. Past medical history has been significant for diabetes mellitus, coronary artery disease, congestive heart failure and ischemic cardiomyopathy with status post amputation of left fourth finger, appendectomy, coronary artery bypass graft, defibrillator placement. Never been a smoker. most recent laboratory investigations done showed WBC of 8000, hemoglobin of 13.2 hematocrit of 38.3 and platelets of 253 with a normal BMP. No recent x-rays have been done ==== Old notes: 82 year old gentleman with a past medical history of COPD, GERD, hyperlipidemia, hypertension, arthritis, peripheral neuropathy and diabetes mellitus was seen in the ER recently and worked up for bilateral feet ulceration. He was also seen recently by Dr. Baruch Gouty with whom he went to establish care in early September. He also sees a podiatrist Dr. Graciela Husbands and there is some question of whether he had osteomyelitis in the past. 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 Most recent x-rays done in the ER on 10/23/2014 did not show any evidence of osteomyelitis both feet. The patient does not check his blood sugar, does not take treatment regularly and is very difficult to understand. We will make a call to his daughter who lives in town and see if we can get some better history from her. 11/01/2014 -- he is here with his daughter today and she says she is going to help him see the podiatrist and get some appointments for surgical care. She also understands that he has to check his blood glucose regularly at  home and control his diabetes. 11/08/2014 --  he was seen by Dr. Graciela Husbands the podiatrist who had x-rays done -- the right foot showed no clear Shed, Chijioke E. ([id number]) evidence of osteomyelitis but there was evidence of peripheral vascular disease. the left foot all show showed no clear evidence of osteomyelitis but there was peripheral vascular disease. Dr. Alberteen Spindle had debrided tissue from the right foot and applied some local Bactroban and sterile bandage and debrided all the toenails and give him instruction to come back when necessary. ==== Electronic Signature(s) Signed: 07/19/2016 3:24:05 PM By: Evlyn Kanner MD, FACS Entered By: Evlyn Kanner on 07/19/2016 15:24:04 [name] ([id number]) -------------------------------------------------------------------------------- Physical Exam Details Patient Name: [name] Date of Service: 07/19/2016 2:30 PM Medical Record Number: [id number] Patient Account Number: [id number] Date of Birth/Sex: [birth year] (82 y.o. Male) Treating RN: Phillis Haggis Primary Care Provider: Baruch Gouty Other Clinician: Referring Provider: Baruch Gouty Treating Provider/Extender: Rudene Re in Treatment: 1 Constitutional . Pulse regular. Respirations normal and unlabored. Afebrile. . Eyes Nonicteric. Reactive to light. Ears, Nose, Mouth, and Throat Lips, teeth, and gums WNL.[name] Moist mucosa without lesions. Neck supple and nontender. No palpable supraclavicular or cervical adenopathy. Normal sized without goiter. Respiratory WNL. No retractions.. Breath sounds WNL, No rubs, rales, rhonchi, or wheeze.. Cardiovascular Heart rhythm and rate regular, no murmur or gallop.. Pedal Pulses WNL. No clubbing, cyanosis or edema. Lymphatic No adneopathy. No adenopathy. No adenopathy. Musculoskeletal Adexa without tenderness or enlargement.. Digits and nails w/o clubbing, cyanosis, infection, petechiae, ischemia, or inflammatory  conditions.. Integumentary (Hair, Skin) No suspicious lesions. No crepitus or fluctuance. No peri-wound warmth or erythema. No masses.[name] Psychiatric Judgement and insight Intact.. No evidence of depression, anxiety, or agitation.. Notes the left foot the wound which is more proximal on the plantar aspect of his first metatarsal head now has a fairly large central area which goes down deep and undermines. I'm concerned about this going towards the bone. The 2 wounds on the right forefoot are fairly superficial Electronic Signature(s) Signed: 07/19/2016 3:25:34 PM By: Evlyn Kanner MD, FACS Entered By: Evlyn Kanner on 07/19/2016 15:25:33 [name], [name] ([id number]) -------------------------------------------------------------------------------- Physician Orders Details Patient Name: [name] Date of Service: 07/19/2016 2:30 PM Medical Record Number: [id number] Patient Account Number: [id number] Date of Birth/Sex: 11/23/1934 (81 y.o. Male) Treating RN: Phillis Haggis Primary Care Provider: Baruch Gouty Other Clinician: Referring Provider: Baruch Gouty Treating Provider/Extender: Rudene Re in Treatment: 1 Verbal / Phone Orders: Yes ClinicianAshok Cordia, Debi Read Back and Verified: Yes Diagnosis Coding Wound Cleansing Wound #4 Right,Plantar Metatarsal head first o Clean wound with Normal Saline. o May Shower, gently pat wound dry prior to applying new dressing. Wound #5 Right,Plantar Metatarsal head third o Clean wound with Normal Saline. o May Shower, gently pat wound dry prior to applying new dressing. Wound #6 Right,Plantar Metatarsal head fifth o Clean wound with Normal Saline. o May Shower, gently pat wound dry prior to applying new dressing. Wound #7 Left,Plantar Metatarsal head first o Clean wound with Normal Saline. o May Shower, gently pat wound dry prior to applying new dressing. Anesthetic Wound #4 Right,Plantar Metatarsal head  first o Topical Lidocaine 4% cream applied to wound bed prior to debridement Wound #5 Right,Plantar Metatarsal head third o Topical Lidocaine 4% cream applied to wound bed prior to debridement Wound #6 Right,Plantar Metatarsal head fifth o Topical Lidocaine 4% cream applied to wound bed prior to debridement Wound #7 Left,Plantar Metatarsal head first o Topical Lidocaine 4% cream applied to wound bed prior to debridement Primary  Wound Dressing Wound #4 Right,Plantar Metatarsal head first o Aquacel Ag - rope, pack lightly into wound Wound #5 Right,Plantar Metatarsal head third o Aquacel Ag Pittman, [name] ([id number]) Wound #6 Right,Plantar Metatarsal head fifth o Aquacel Ag Wound #7 Left,Plantar Metatarsal head first o Aquacel Ag Secondary Dressing Wound #4 Right,Plantar Metatarsal head first o ABD pad o Dry Gauze o Conform/Kerlix Wound #5 Right,Plantar Metatarsal head third o ABD pad o Dry Gauze o Conform/Kerlix Wound #6 Right,Plantar Metatarsal head fifth o ABD pad o Dry Gauze o Conform/Kerlix Wound #7 Left,Plantar Metatarsal head first o ABD pad o Dry Gauze o Conform/Kerlix Dressing Change Frequency Wound #4 Right,Plantar Metatarsal head first o Change Dressing Monday, Wednesday, Friday Wound #5 Right,Plantar Metatarsal head third o Change Dressing Monday, Wednesday, Friday Wound #6 Right,Plantar Metatarsal head fifth o Change Dressing Monday, Wednesday, Friday Wound #7 Left,Plantar Metatarsal head first o Change Dressing Monday, Wednesday, Friday Follow-up Appointments Wound #4 Right,Plantar Metatarsal head first o Return Appointment in 1 week. Wound #5 Right,Plantar Metatarsal head third o Return Appointment in 1 week. [name], [name] ([id number]) Wound #6 Right,Plantar Metatarsal head fifth o Return Appointment in 1 week. Wound #7 Left,Plantar Metatarsal head first o Return Appointment in 1  week. Edema Control Wound #4 Right,Plantar Metatarsal head first o Elevate legs to the level of the heart and pump ankles as often as possible Wound #5 Right,Plantar Metatarsal head third o Elevate legs to the level of the heart and pump ankles as often as possible Wound #6 Right,Plantar Metatarsal head fifth o Elevate legs to the level of the heart and pump ankles as often as possible Wound #7 Left,Plantar Metatarsal head first o Elevate legs to the level of the heart and pump ankles as often as possible Off-Loading Wound #4 Right,Plantar Metatarsal head first o Other: - front off loader Wound #5 Right,Plantar Metatarsal head third o Other: - front off loader Wound #6 Right,Plantar Metatarsal head fifth o Other: - front off loader Wound #7 Left,Plantar Metatarsal head first o Other: - front off loader Additional Orders / Instructions Wound #4 Right,Plantar Metatarsal head first o Increase protein intake. Wound #5 Right,Plantar Metatarsal head third o Increase protein intake. Wound #6 Right,Plantar Metatarsal head fifth o Increase protein intake. Wound #7 Left,Plantar Metatarsal head first o Increase protein intake. Home Health Wound #4 Right,Plantar Metatarsal head first CYPHER, PAULE ([id number]) o Continue Home Health Visits o Home Health Nurse may visit PRN to address patientos wound care needs. o FACE TO FACE ENCOUNTER: MEDICARE and MEDICAID PATIENTS: I certify that this patient is under my care and that I had a face-to-face encounter that meets the physician face-to-face encounter requirements with this patient on this date. The encounter with the patient was in whole or in part for the following MEDICAL CONDITION: (primary reason for Home Healthcare) MEDICAL NECESSITY: I certify, that based on my findings, NURSING services are a medically necessary home health service. HOME BOUND STATUS: I certify that my clinical findings support that  this patient is homebound (i.e., Due to illness or injury, pt requires aid of supportive devices such as crutches, cane, wheelchairs, walkers, the use of special transportation or the assistance of another person to leave their place of residence. There is a normal inability to leave the home and doing so requires considerable and taxing effort. Other absences are for medical reasons / religious services and are infrequent or of short duration when for other reasons). o If current dressing causes regression in wound condition, may  D/C ordered dressing product/s and apply Normal Saline Moist Dressing daily until next Wound Healing Center / Other MD appointment. Notify Wound Healing Center of regression in wound condition at [phone]. o Please direct any NON-WOUND related issues/requests for orders to patient's Primary Care Physician Wound #5 Right,Plantar Metatarsal head third o Continue Home Health Visits o Home Health Nurse may visit PRN to address patientos wound care needs. o FACE TO FACE ENCOUNTER: MEDICARE and MEDICAID PATIENTS: I certify that this patient is under my care and that I had a face-to-face encounter that meets the physician face-to-face encounter requirements with this patient on this date. The encounter with the patient was in whole or in part for the following MEDICAL CONDITION: (primary reason for Home Healthcare) MEDICAL NECESSITY: I certify, that based on my findings, NURSING services are a medically necessary home health service. HOME BOUND STATUS: I certify that my clinical findings support that this patient is homebound (i.e., Due to illness or injury, pt requires aid of supportive devices such as crutches, cane, wheelchairs, walkers, the use of special transportation or the assistance of another person to leave their place of residence. There is a normal inability to leave the home and doing so requires considerable and taxing effort. Other absences  are for medical reasons / religious services and are infrequent or of short duration when for other reasons). o If current dressing causes regression in wound condition, may D/C ordered dressing product/s and apply Normal Saline Moist Dressing daily until next Wound Healing Center / Other MD appointment. Notify Wound Healing Center of regression in wound condition at [phone]. o Please direct any NON-WOUND related issues/requests for orders to patient's Primary Care Physician Wound #6 Right,Plantar Metatarsal head fifth o Continue Home Health Visits o Home Health Nurse may visit PRN to address patientos wound care needs. o FACE TO FACE ENCOUNTER: MEDICARE and MEDICAID PATIENTS: I certify that this patient is under my care and that I had a face-to-face encounter that meets the physician face-to-face encounter requirements with this patient on this date. The encounter with the patient was in whole or in part for the following MEDICAL CONDITION: (primary reason for Home Healthcare) MEDICAL NECESSITY: I certify, that based on my findings, NURSING services are a medically necessary home health service. HOME BOUND STATUS: I certify that my clinical findings [name], [name] ([id number]) support that this patient is homebound (i.e., Due to illness or injury, pt requires aid of supportive devices such as crutches, cane, wheelchairs, walkers, the use of special transportation or the assistance of another person to leave their place of residence. There is a normal inability to leave the home and doing so requires considerable and taxing effort. Other absences are for medical reasons / religious services and are infrequent or of short duration when for other reasons). o If current dressing causes regression in wound condition, may D/C ordered dressing product/s and apply Normal Saline Moist Dressing daily until next Wound Healing Center / Other MD appointment. Notify Wound Healing  Center of regression in wound condition at [phone]. o Please direct any NON-WOUND related issues/requests for orders to patient's Primary Care Physician Wound #7 Left,Plantar Metatarsal head first o Continue Home Health Visits o Home Health Nurse may visit PRN to address patientos wound care needs. o FACE TO FACE ENCOUNTER: MEDICARE and MEDICAID PATIENTS: I certify that this patient is under my care and that I had a face-to-face encounter that meets the physician face-to-face encounter requirements with this patient on this date. The  encounter with the patient was in whole or in part for the following MEDICAL CONDITION: (primary reason for Home Healthcare) MEDICAL NECESSITY: I certify, that based on my findings, NURSING services are a medically necessary home health service. HOME BOUND STATUS: I certify that my clinical findings support that this patient is homebound (i.e., Due to illness or injury, pt requires aid of supportive devices such as crutches, cane, wheelchairs, walkers, the use of special transportation or the assistance of another person to leave their place of residence. There is a normal inability to leave the home and doing so requires considerable and taxing effort. Other absences are for medical reasons / religious services and are infrequent or of short duration when for other reasons). o If current dressing causes regression in wound condition, may D/C ordered dressing product/s and apply Normal Saline Moist Dressing daily until next Wound Healing Center / Other MD appointment. Notify Wound Healing Center of regression in wound condition at [phone]. o Please direct any NON-WOUND related issues/requests for orders to patient's Primary Care Physician Radiology o X-ray, foot - left Electronic Signature(s) Signed: 07/19/2016 4:05:23 PM By: Alejandro Mulling Signed: 07/22/2016 3:31:51 PM By: Evlyn Kanner MD, FACS Previous Signature: 07/19/2016 3:36:12 PM  Version By: Evlyn Kanner MD, FACS Entered By: Alejandro Mulling on 07/19/2016 15:45:58 Hammen, [name] ([id number]) -------------------------------------------------------------------------------- Problem List Details Patient Name: [name] Date of Service: 07/19/2016 2:30 PM Medical Record Number: [id number] Patient Account Number: [id number] Date of Birth/Sex: 1934/08/02 (82 y.o. 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 Male) Treating RN: Phillis Haggis Primary Care Provider: Baruch Gouty Other Clinician: Referring Provider: Baruch Gouty Treating Provider/Extender: Rudene Re in Treatment: 1 Subjective Chief Complaint Information obtained from Patient This 82 year old patient is a very poor historian and has no family member with him. He has ulcers both feet which are present for about 2 weeks as per his history but I believe there have been callouses is for longer than that History of Present Illness (HPI) The  following HPI elements were documented for the patient's wound: Location: ulcers on both feet Quality: Patient reports No Pain. Severity: Patient states wound are getting worse. Duration: Patient states that they are not certain how long the wound has been present. Context: The wound appeared gradually over time Modifying Factors: Consults to this date include: was seen by his PCP recently. Associated Signs and Symptoms: Patient reports having difficulty standing for long periods. 82 year old patient was recently seen by his PCP Dr. Baruch Gouty, who noted a large tunneling ulcer and blister on the left foot extending medially near the base of the toe. There were also 2 small ulcers on the right foot. his most recent hemoglobin A1c was 5.8 He has been placed on clindamycin recently He has been treated by  her for congestive heart failure and was recently put on clindamycin for the abscesses and ulceration of the foot. Past medical history has been significant for diabetes mellitus, coronary artery disease, congestive heart failure and ischemic cardiomyopathy with status post amputation of left fourth finger, appendectomy, coronary artery bypass graft, defibrillator placement. Never been a smoker. most recent laboratory investigations done showed WBC of 8000, hemoglobin of 13.2 hematocrit of 38.3 and platelets of 253 with a normal BMP. No recent x-rays have been done ==== Old notes: 82 year old gentleman with a past medical history of COPD, GERD, hyperlipidemia, hypertension, arthritis, peripheral neuropathy and diabetes mellitus was seen in the ER recently and worked up for bilateral feet ulceration. He was also seen recently by Dr. Baruch GoutyMelinda [name] with whom he went to establish care in early September. He also sees a podiatrist Dr. Graciela HusbandsKlein and there is some question of whether he had osteomyelitis [name], [name] ([id number]) in the past. He's also had surgery for a pacemaker, appendectomy,  CABG, clavicle surgery, amputation fourth finger on the left hand. Most recent x-rays done in the ER on 10/23/2014 did not show any evidence of osteomyelitis both feet. The patient does not check his blood sugar, does not take treatment regularly and is very difficult to understand. We will make a call to his daughter who lives in town and see if we can get some better history from her. 11/01/2014 -- he is here with his daughter today and she says she is going to help him see the podiatrist and get some appointments for surgical care. She also understands that he has to check his blood glucose regularly at home and control his diabetes. 11/08/2014 -- he was seen by Dr. Graciela HusbandsKlein the podiatrist who had x-rays done -- the right foot showed no clear evidence of osteomyelitis but there was evidence of peripheral vascular disease. the left foot all show showed no clear evidence of osteomyelitis but there was peripheral vascular disease. Dr. Alberteen Spindleline had debrided tissue from the right foot and applied some local Bactroban and sterile bandage and debrided all the toenails and give him instruction to come back when necessary. ==== Objective Constitutional Pulse regular. Respirations normal and unlabored. Afebrile. Vitals Time Taken: 2:42 PM, Height: 72 in, Weight: 227 lbs, BMI: 30.8, Temperature: 97.7 F, Pulse: 67 bpm, Respiratory Rate: 20 breaths/min, Blood Pressure: 134/72 mmHg. Eyes Nonicteric. Reactive to light. Ears, Nose, Mouth, and Throat Lips, teeth, and gums WNL.[name]. Moist mucosa without lesions. Neck supple and nontender. No palpable supraclavicular or cervical adenopathy. Normal sized without goiter. Respiratory WNL. No retractions.. Breath sounds WNL, No rubs, rales, rhonchi, or wheeze.. Cardiovascular Heart rhythm and rate regular, no murmur or gallop.. Pedal Pulses WNL. No clubbing, cyanosis or edema. Lymphatic No adneopathy. No adenopathy. No adenopathy. Musculoskeletal Adexa without  tenderness or enlargement.. Digits and nails w/o clubbing, cyanosis, infection, petechiae, Heydt, Dhillon E. ([id number]) ischemia, or inflammatory conditions.[name]. Psychiatric Judgement and insight Intact.. No evidence of depression, anxiety, or agitation.. General Notes: the left foot the wound which is more proximal on the plantar aspect of his first metatarsal head now has a fairly large central area which goes down deep and undermines. I'm concerned about this going towards the bone. The 2 wounds on the right forefoot are fairly superficial Integumentary (Hair, Skin) No suspicious lesions. No crepitus or fluctuance. No peri-wound warmth or erythema. No masses.. Wound #4 status is Open. Original cause of wound was Gradually Appeared. The wound is located on the Right,Plantar Metatarsal head first.  The wound measures 0.5cm length x 0.8cm width x 0.3cm depth; 0.314cm^2 area and 0.094cm^3 volume. There is no tunneling or undermining noted. There is a large amount of serous drainage noted. The wound margin is flat and intact. There is medium (34-66%) red granulation within the wound bed. There is a medium (34-66%) amount of necrotic tissue within the wound bed including Adherent Slough. The periwound skin appearance exhibited: Maceration, Ecchymosis. Periwound temperature was noted as No Abnormality. The periwound has tenderness on palpation. Wound #5 status is Open. Original cause of wound was Gradually Appeared. The wound is located on the Right,Plantar Metatarsal head third. The wound measures 0.9cm length x 0.7cm width x 0.3cm depth; 0.495cm^2 area and 0.148cm^3 volume. There is no tunneling or undermining noted. There is a large amount of serous drainage noted. The wound margin is flat and intact. There is medium (34-66%) red granulation within the wound bed. There is a medium (34-66%) amount of necrotic tissue within the wound bed including Adherent Slough. The periwound skin appearance  exhibited: Callus, Maceration, Ecchymosis. Periwound temperature was noted as No Abnormality. The periwound has tenderness on palpation. Wound #6 status is Open. Original cause of wound was Gradually Appeared. The wound is located on the Right,Plantar Metatarsal head fifth. The wound measures 0.2cm length x 0.2cm width x 0.1cm depth; 0.031cm^2 area and 0.003cm^3 volume. There is no tunneling or undermining noted. There is a large amount of serous drainage noted. The wound margin is distinct with the outline attached to the wound base. There is medium (34-66%) red granulation within the wound bed. There is a medium (34-66%) amount of necrotic tissue within the wound bed including Adherent Slough. The periwound skin appearance exhibited: Maceration. Periwound temperature was noted as No Abnormality. The periwound has tenderness on palpation. Wound #7 status is Open. Original cause of wound was Gradually Appeared. The wound is located on the Left,Plantar Metatarsal head first. The wound measures 3.3cm length x 1.2cm width x 0.8cm depth; 3.11cm^2 area and 2.488cm^3 volume. There is Fat Layer (Subcutaneous Tissue) Exposed exposed. There is no tunneling or undermining noted. There is a large amount of serous drainage noted. The wound margin is flat and intact. There is small (1-33%) pink granulation within the wound bed. There is a large (67- 100%) amount of necrotic tissue within the wound bed including Adherent Slough. The periwound skin appearance exhibited: Maceration, Erythema. The periwound skin appearance did not exhibit: Callus, Crepitus, Excoriation, Induration, Rash, Scarring, Dry/Scaly, Atrophie Blanche, Cyanosis, Ecchymosis, Hemosiderin Staining, Mottled, Pallor, Rubor. The surrounding wound skin color is noted with erythema. Periwound temperature was noted as No Abnormality. The periwound has tenderness on palpation. 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 May Shower, gently pat wound dry prior to applying new dressing. Wound #5 Right,Plantar Metatarsal head third: Clean wound with Normal Saline. May Shower, gently pat wound dry prior to applying new dressing. Wound #6 Right,Plantar Metatarsal head fifth: Clean wound with Normal Saline. May Shower, gently pat wound dry prior to applying new dressing. Wound #7 Left,Plantar Metatarsal head first: Clean wound with Normal Saline. May Shower, gently pat wound dry prior to applying new dressing. Anesthetic: Wound #4 Right,Plantar Metatarsal head first:  Topical Lidocaine 4% cream applied to wound bed prior to debridement Wound #5 Right,Plantar Metatarsal head third: Topical Lidocaine 4% cream applied to wound bed prior to debridement Wound #6 Right,Plantar Metatarsal head fifth: Topical Lidocaine 4% cream applied to wound bed prior to debridement Wound #7 Left,Plantar Metatarsal head first: Topical Lidocaine 4% cream applied to wound bed prior to debridement Primary Wound Dressing: Wound #4 Right,Plantar Metatarsal head first: Aquacel Ag - rope, pack lightly into wound Wound #5 Right,Plantar Metatarsal head third: Aquacel Ag Wound #6 Right,Plantar Metatarsal head fifth: Aquacel Ag Wound #7 Left,Plantar Metatarsal head first: Aquacel Ag Secondary Dressing: RODRIGO, MCGRANAHAN ([id number]) Wound #4 Right,Plantar Metatarsal head first: ABD pad Dry Gauze Conform/Kerlix Wound #5 Right,Plantar Metatarsal head third: ABD pad Dry Gauze Conform/Kerlix Wound #6 Right,Plantar Metatarsal head fifth: ABD pad Dry Gauze Conform/Kerlix Wound #7 Left,Plantar  Metatarsal head first: ABD pad Dry Gauze Conform/Kerlix Dressing Change Frequency: Wound #4 Right,Plantar Metatarsal head first: Change Dressing Monday, Wednesday, Friday Wound #5 Right,Plantar Metatarsal head third: Change Dressing Monday, Wednesday, Friday Wound #6 Right,Plantar Metatarsal head fifth: Change Dressing Monday, Wednesday, Friday Wound #7 Left,Plantar Metatarsal head first: Change Dressing Monday, Wednesday, Friday Follow-up Appointments: Wound #4 Right,Plantar Metatarsal head first: Return Appointment in 1 week. Wound #5 Right,Plantar Metatarsal head third: Return Appointment in 1 week. Wound #6 Right,Plantar Metatarsal head fifth: Return Appointment in 1 week. Wound #7 Left,Plantar Metatarsal head first: Return Appointment in 1 week. Edema Control: Wound #4 Right,Plantar Metatarsal head first: Elevate legs to the level of the heart and pump ankles as often as possible Wound #5 Right,Plantar Metatarsal head third: Elevate legs to the level of the heart and pump ankles as often as possible Wound #6 Right,Plantar Metatarsal head fifth: Elevate legs to the level of the heart and pump ankles as often as possible Wound #7 Left,Plantar Metatarsal head first: Elevate legs to the level of the heart and pump ankles as often as possible Off-Loading: Wound #4 Right,Plantar Metatarsal head first: Other: - front off loader Wound #5 Right,Plantar Metatarsal head third: Other: - front off loader Wound #6 Right,Plantar Metatarsal head fifth: Other: - front off loader Wound #7 Left,Plantar Metatarsal head first: AADITYA, LETIZIA. ([id number]) Other: - front off loader Additional Orders / Instructions: Wound #4 Right,Plantar Metatarsal head first: Increase protein intake. Wound #5 Right,Plantar Metatarsal head third: Increase protein intake. Wound #6 Right,Plantar Metatarsal head fifth: Increase protein intake. Wound #7 Left,Plantar Metatarsal head first: Increase  protein intake. Home Health: Wound #4 Right,Plantar Metatarsal head first: Continue Home Health Visits Home Health Nurse may visit PRN to address patient s wound care needs. FACE TO FACE ENCOUNTER: MEDICARE and MEDICAID PATIENTS: I certify that this patient is under my care and that I had a face-to-face encounter that meets the physician face-to-face encounter requirements with this patient on this date. The encounter with the patient was in whole or in part for the following MEDICAL CONDITION: (primary reason for Home Healthcare) MEDICAL NECESSITY: I certify, that based on my findings, NURSING services are a medically necessary home health service. HOME BOUND STATUS: I certify that my clinical findings support that this patient is homebound (i.e., Due to illness or injury, pt requires aid of supportive devices such as crutches, cane, wheelchairs, walkers, the use of special transportation or the assistance of another person to leave their place of residence. There is a normal inability to leave the home and doing so requires considerable and taxing effort. Other absences are for medical reasons / religious services  and are infrequent or of short duration when for other reasons). If current dressing causes regression in wound condition, may D/C ordered dressing product/s and apply Normal Saline Moist Dressing daily until next Wound Healing Center / Other MD appointment. Notify Wound Healing Center of regression in wound condition at [phone]. Please direct any NON-WOUND related issues/requests for orders to patient's Primary Care Physician Wound #5 Right,Plantar Metatarsal head third: Continue Home Health Visits Home Health Nurse may visit PRN to address patient s wound care needs. FACE TO FACE ENCOUNTER: MEDICARE and MEDICAID PATIENTS: I certify that this patient is under my care and that I had a face-to-face encounter that meets the physician face-to-face encounter requirements with  this patient on this date. The encounter with the patient was in whole or in part for the following MEDICAL CONDITION: (primary reason for Home Healthcare) MEDICAL NECESSITY: I certify, that based on my findings, NURSING services are a medically necessary home health service. HOME BOUND STATUS: I certify that my clinical findings support that this patient is homebound (i.e., Due to illness or injury, pt requires aid of supportive devices such as crutches, cane, wheelchairs, walkers, the use of special transportation or the assistance of another person to leave their place of residence. There is a normal inability to leave the home and doing so requires considerable and taxing effort. Other absences are for medical reasons / religious services and are infrequent or of short duration when for other reasons). If current dressing causes regression in wound condition, may D/C ordered dressing product/s and apply Normal Saline Moist Dressing daily until next Wound Healing Center / Other MD appointment. Notify Wound Healing Center of regression in wound condition at [phone]. Please direct any NON-WOUND related issues/requests for orders to patient's Primary Care Physician Wound #6 Right,Plantar Metatarsal head fifth: Continue Home Health Visits Home Health Nurse may visit PRN to address patient s wound care needs. FACE TO FACE ENCOUNTER: MEDICARE and MEDICAID PATIENTS: I certify that this patient is under my care and that I had a face-to-face encounter that meets the physician face-to-face encounter requirements with this patient on this date. The encounter with the patient was in whole or in part for the IRAN, ROWE. ([id number]) following MEDICAL CONDITION: (primary reason for Home Healthcare) MEDICAL NECESSITY: I certify, that based on my findings, NURSING services are a medically necessary home health service. HOME BOUND STATUS: I certify that my clinical findings support that this  patient is homebound (i.e., Due to illness or injury, pt requires aid of supportive devices such as crutches, cane, wheelchairs, walkers, the use of special transportation or the assistance of another person to leave their place of residence. There is a normal inability to leave the home and doing so requires considerable and taxing effort. Other absences are for medical reasons / religious services and are infrequent or of short duration when for other reasons). If current dressing causes regression in wound condition, may D/C ordered dressing product/s and apply Normal Saline Moist Dressing daily until next Wound Healing Center / Other MD appointment. Notify Wound Healing Center of regression in wound condition at [phone]. Please direct any NON-WOUND related issues/requests for orders to patient's Primary Care Physician Wound #7 Left,Plantar Metatarsal head first: Continue Home Health Visits Home Health Nurse may visit PRN to address patient s wound care needs. FACE TO FACE ENCOUNTER: MEDICARE and MEDICAID PATIENTS: I certify that this patient is under my care and that I had a face-to-face encounter that meets the physician face-to-face  encounter requirements with this patient on this date. The encounter with the patient was in whole or in part for the following MEDICAL CONDITION: (primary reason for Home Healthcare) MEDICAL NECESSITY: I certify, that based on my findings, NURSING services are a medically necessary home health service. HOME BOUND STATUS: I certify that my clinical findings support that this patient is homebound (i.e., Due to illness or injury, pt requires aid of supportive devices such as crutches, cane, wheelchairs, walkers, the use of special transportation or the assistance of another person to leave their place of residence. There is a normal inability to leave the home and doing so requires considerable and taxing effort. Other absences are for medical reasons /  religious services and are infrequent or of short duration when for other reasons). If current dressing causes regression in wound condition, may D/C ordered dressing product/s and apply Normal Saline Moist Dressing daily until next Wound Healing Center / Other MD appointment. Notify Wound Healing Center of regression in wound condition at [phone]. Please direct any NON-WOUND related issues/requests for orders to patient's Primary Care Physician Radiology ordered were: X-ray, foot - left After review today I have recommended: 1. Silver alginate, offloading felt and dressing changes to be done every other day 2. [employer]co Front offloading shoes 3. appropriate control of his diabetes mellitus 4. I have requested x-rays at this stage as the left forefoot wound is now going fairly deep 5. Regular physician the wound center Electronic Signature(s) Signed: 07/22/2016 12:43:45 PM By: Evlyn Kanner MD, FACS Previous Signature: 07/22/2016 12:43:33 PM Version By: Evlyn Kanner MD, FACS Previous Signature: 07/19/2016 3:27:52 PM Version By: Evlyn Kanner MD, FACS Entered By: Evlyn Kanner on 07/22/2016 12:43:45 Scalici, TRAVUS OREN ([id number]) ANDERSSON, LARRABEE ([id number]) -------------------------------------------------------------------------------- SuperBill Details Patient Name: [name] Date of Service: 07/19/2016 Medical Record Number: [id number] Patient Account Number: [id number] Date of Birth/Sex: Apr 05, 1934 (81 y.o. 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 Facility  Procedures CPT4 Code: [id number] Description: 24401 - WOUND CARE VISIT-LEV 5 EST PT Modifier: Quantity: 1 Physician Procedures CPT4 Code Description: [id number] - WC PHYS LEVEL 3 - EST PT ICD-10 Description Diagnosis E11.621 Type 2 diabetes mellitus with foot ulcer L97.512 Non-pressure chronic ulcer of other part of right fo L97.522 Non-pressure chronic ulcer of other part of  left foo Modifier: ot with fat lay t with fat laye Quantity: 1 er exposed r exposed Electronic Signature(s) Signed: 07/19/2016 4:05:23 PM By: Alejandro Mulling Signed: 07/22/2016 3:31:51 PM By: Evlyn Kanner MD, FACS Previous Signature: 07/19/2016 3:28:04 PM Version By: Evlyn Kanner MD, FACS Entered By: Alejandro Mulling on 07/19/2016 15:52:05

## 2016-07-26 ENCOUNTER — Ambulatory Visit: Payer: Self-pay | Admitting: Family Medicine

## 2016-07-26 ENCOUNTER — Ambulatory Visit: Payer: Medicare Other | Admitting: Surgery

## 2016-07-26 ENCOUNTER — Other Ambulatory Visit: Payer: Self-pay | Admitting: Family Medicine

## 2016-07-26 MED ORDER — FUROSEMIDE 20 MG PO TABS: 20.0000 mg | ORAL_TABLET | Freq: Every day | ORAL | 0 refills | Status: DC

## 2016-07-28 ENCOUNTER — Encounter: Payer: Medicare Other | Admitting: Internal Medicine

## 2016-07-28 DIAGNOSIS — E11621 Type 2 diabetes mellitus with foot ulcer: Secondary | ICD-10-CM | POA: Diagnosis not present

## 2016-07-30 NOTE — Progress Notes (Signed)
[name], [name] ([id number]) Visit Report for 07/28/2016 Debridement Details Patient Name: [name], [name] Date of Service: 07/28/2016 11:15 AM Medical Record Number: [id number] Patient Account Number: [id number] Date of Birth/Sex: 11-26-1934 (81 y.o. Male) Treating RN: Huel Coventry Primary Care Provider: Baruch Gouty Other Clinician: Referring Provider: Baruch Gouty Treating Provider/Extender: Altamese [place] in Treatment: 2 Debridement Performed for Wound #7 Left,Plantar Metatarsal head first Assessment: Performed By: Physician Maxwell Caul, MD Debridement: Debridement Severity of Tissue Pre Fat layer exposed Debridement: Pre-procedure Verification/Time Out Yes - 11:45 Taken: Start Time: 11:46 Pain Control: Other : lidocaine 4% Level: Skin/Subcutaneous Tissue Total Area Debrided (L x 1.3 (cm) x 1.2 (cm) = 1.56 (cm) W): Tissue and other Viable, Non-Viable, Callus, Subcutaneous material debrided: Instrument: Forceps, Scissors Bleeding: Moderate Hemostasis Achieved: Silver Nitrate End Time: 11:50 Procedural Pain: Insensate Post Procedural Pain: Insensate Response to Treatment: Procedure was tolerated well Post Debridement Measurements of Total Wound Length: (cm) 1.6 Width: (cm) 1.8 Depth: (cm) 0.5 Volume: (cm) 1.131 Character of Wound/Ulcer Post Requires Further Debridement Debridement: Severity of Tissue Post Debridement: Fat layer exposed Post Procedure Diagnosis Same as Pre-procedure [name], [name] ([id number]) Electronic Signature(s) Signed: 07/28/2016 5:26:18 PM By: Baltazar Najjar MD Signed: 07/28/2016 9:16:41 PM By: Elliot Gurney, BSN, RN, CWS, Kim RN, BSN Entered By: Elliot Gurney, BSN, RN, CWS, Kim on 07/28/2016 11:52:03 Melgarejo, [name] ([id number]) -------------------------------------------------------------------------------- Debridement Details Patient Name: [name] Date of Service: 07/28/2016 11:15 AM Medical Record Number:  [id number] Patient Account Number: [id number] Date of Birth/Sex: 08-01-1934 (81 y.o. Male) Treating RN: Huel Coventry Primary Care Provider: Baruch Gouty Other Clinician: Referring Provider: Baruch Gouty Treating Provider/Extender: Maxwell Caul We[place] in Treatment: 2 Debridement Performed for Wound #4 Right,Plantar Metatarsal head first Assessment: Performed By: Physician Maxwell Caul, MD Debridement: Debridement Severity of Tissue Pre Fat layer exposed Debridement: Pre-procedure Verification/Time Out Yes - 11:45 Taken: Start Time: 11:46 Pain Control: Other : lidocaine 4% Level: Skin/Subcutaneous Tissue Total Area Debrided (L x 0.6 (cm) x 0.4 (cm) = 0.24 (cm) W): Tissue and other Viable, Non-Viable, Callus, Subcutaneous material debrided: Instrument: Forceps, Scissors Bleeding: Moderate Hemostasis Achieved: Silver Nitrate End Time: 11:50 Procedural Pain: Insensate Post Procedural Pain: Insensate Response to Treatment: Procedure was tolerated well Post Debridement Measurements of Total Wound Length: (cm) 0.4 Width: (cm) 0.3 Depth: (cm) 0.1 Volume: (cm) 0.009 Character of Wound/Ulcer Post Requires Further Debridement Debridement: Severity of Tissue Post Debridement: Fat layer exposed Post Procedure Diagnosis Same as Pre-procedure Electronic Signature(s) Signed: 07/28/2016 5:26:18 PM By: Baltazar Najjar MD Signed: 07/28/2016 9:16:41 PM By: Elliot Gurney, BSN, RN, CWS, Kim RN, BSN [name], Kenneth Rock E. ([id number]) Entered By: Elliot Gurney, BSN, RN, CWS, Kim on 07/28/2016 11:53:42 [name], [name] ([id number]) -------------------------------------------------------------------------------- HPI Details Patient Name: [name] Date of Service: 07/28/2016 11:15 AM Medical Record Number: [id number] Patient Account Number: [id number] Date of Birth/Sex: 18-Jul-1934 (82 y.o. Male) Treating RN: Huel Coventry Primary Care Provider: Baruch Gouty Other Clinician: Referring  Provider: Baruch Gouty Treating Provider/Extender: Maxwell Caul We[place] in Treatment: 2 History of Present Illness Location: ulcers on both feet Quality: Patient reports No Pain. Severity: Patient states wound are getting worse. Duration: Patient states that they are not certain how long the wound has been present. Context: The wound appeared gradually over time Modifying Factors: Consults to this date include: was seen by his PCP recently. Associated Signs and Symptoms: Patient reports having difficulty standing for long periods. HPI Description: 82 year old patient was recently seen by his PCP Dr. Baruch Gouty, who  noted a large tunneling ulcer and blister on the left foot extending medially near the base of the toe. There were also 2 small ulcers on the right foot. his most recent hemoglobin A1c was 5.8 He has been placed on clindamycin recently He has been treated by her for congestive heart failure and was recently put on clindamycin for the abscesses and ulceration of the foot. Past medical history has been significant for diabetes mellitus, coronary artery disease, congestive heart failure and ischemic cardiomyopathy with status post amputation of left fourth finger, appendectomy, coronary artery bypass graft, defibrillator placement. Never been a smoker. most recent laboratory investigations done showed WBC of 8000, hemoglobin of 13.2 hematocrit of 38.3 and platelets of 253 with a normal BMP. No recent x-rays have been done ==== Old notes: 82 year old gentleman with a past medical history of COPD, GERD, hyperlipidemia, hypertension, arthritis, peripheral neuropathy and diabetes mellitus was seen in the ER recently and worked up for bilateral feet ulceration. He was also seen recently by Dr. Baruch Gouty with whom he went to establish care in early September. He also sees a podiatrist Dr. Graciela Husbands and there is some question of whether he had osteomyelitis in the past. He's also  had surgery for a pacemaker, appendectomy, CABG, clavicle surgery, amputation fourth finger on the left hand. Most recent x-rays done in the ER on 10/23/2014 did not show any evidence of osteomyelitis both feet. The patient does not check his blood sugar, does not take treatment regularly and is very difficult to understand. We will make a call to his daughter who lives in town and see if we can get some better history from her. 11/01/2014 -- he is here with his daughter today and she says she is going to help him see the podiatrist and get some appointments for surgical care. She also understands that he has to check his blood glucose regularly at home and control his diabetes. 11/08/2014 -- he was seen by Dr. Graciela Husbands the podiatrist who had x-rays done -- the right foot showed no clear Sokolov, Rudolph E. ([id number]) evidence of osteomyelitis but there was evidence of peripheral vascular disease. the left foot all show showed no clear evidence of osteomyelitis but there was peripheral vascular disease. Dr. Alberteen Spindle had debrided tissue from the right foot and applied some local Bactroban and sterile bandage and debrided all the toenails and give him instruction to come back when necessary. ==== 07/28/16; patient arrives today in my clinic upset that he has not been seen by home health [[facility]]. He had an x-ray of the left foot that was negative for osteomyelitis. He has been walking long distances on both of these feet which I talked to him about today. Electronic Signature(s) Signed: 07/28/2016 5:26:18 PM By: Baltazar Najjar MD Entered By: Baltazar Najjar on 07/28/2016 12:46:12 Buzan, [name] ([id number]) -------------------------------------------------------------------------------- Physical Exam Details Patient Name: [name] Date of Service: 07/28/2016 11:15 AM Medical Record Number: [id number] Patient Account Number: [id number] Date of Birth/Sex: 1934/08/15 (81 y.o.  Male) Treating RN: Huel Coventry Primary Care Provider: Baruch Gouty Other Clinician: Referring Provider: Baruch Gouty Treating Provider/Extender: Maxwell Caul [place] in Treatment: 2 Constitutional Sitting or standing Blood Pressure is within target range for patient.. Pulse regular and within target range for patient.[name] Respirations regular, non-labored and within target range.. Temperature is normal and within the target range for the patient.[name] appears in no distress. Cardiovascular Pedal pulses are palpable. Notes Wound exam; the left foot wound on the plantar aspect  of his first metatarsal has considerable undermining. Using pickups and scalpel and removed nonviable skin and circumferential subcutaneous tissue. The base of this is boggy not particularly healthy. I did not palpate any bone. oThe 2 wounds on the right forefoot also have some undermining removed with pickups and a scalpel but they are more superficial and do not appear to be unhealthy. My feeling here is that if we offloaded this adequately these would probably close Electronic Signature(s) Signed: 07/28/2016 5:26:18 PM By: Baltazar Najjar MD Entered By: Baltazar Najjar on 07/28/2016 12:47:48 [name], [name] ([id number]) -------------------------------------------------------------------------------- Physician Orders Details Patient Name: [name] Date of Service: 07/28/2016 11:15 AM Medical Record Number: [id number] Patient Account Number: [id number] Date of Birth/Sex: 01/29/1935 (81 y.o. Male) Treating RN: Huel Coventry Primary Care Provider: Baruch Gouty Other Clinician: Referring Provider: Baruch Gouty Treating Provider/Extender: Altamese [place] in Treatment: 2 Verbal / Phone Orders: No Diagnosis Coding Wound Cleansing Wound #4 Right,Plantar Metatarsal head first o Clean wound with Normal Saline. o May Shower, gently pat wound dry prior to applying new dressing. Wound #5  Right,Plantar Metatarsal head third o Clean wound with Normal Saline. o May Shower, gently pat wound dry prior to applying new dressing. Wound #7 Left,Plantar Metatarsal head first o Clean wound with Normal Saline. o May Shower, gently pat wound dry prior to applying new dressing. Anesthetic Wound #4 Right,Plantar Metatarsal head first o Topical Lidocaine 4% cream applied to wound bed prior to debridement Wound #5 Right,Plantar Metatarsal head third o Topical Lidocaine 4% cream applied to wound bed prior to debridement Wound #7 Left,Plantar Metatarsal head first o Topical Lidocaine 4% cream applied to wound bed prior to debridement Primary Wound Dressing Wound #4 Right,Plantar Metatarsal head first o Aquacel Ag - rope, pack lightly into wound Wound #5 Right,Plantar Metatarsal head third o Aquacel Ag - rope, pack lightly into wound Wound #7 Left,Plantar Metatarsal head first o Aquacel Ag - rope, pack lightly into wound Secondary Dressing Wound #4 Right,Plantar Metatarsal head first o ABD pad o Dry Gauze [name], [name] ([id number]) o Conform/Kerlix Wound #5 Right,Plantar Metatarsal head third o ABD pad o Dry Gauze o Conform/Kerlix Wound #7 Left,Plantar Metatarsal head first o ABD pad o Dry Gauze o Conform/Kerlix Dressing Change Frequency Wound #4 Right,Plantar Metatarsal head first o Change Dressing Monday, Wednesday, Friday Wound #5 Right,Plantar Metatarsal head third o Change Dressing Monday, Wednesday, Friday Wound #7 Left,Plantar Metatarsal head first o Change Dressing Monday, Wednesday, Friday Follow-up Appointments Wound #4 Right,Plantar Metatarsal head first o Return Appointment in 1 week. Wound #5 Right,Plantar Metatarsal head third o Return Appointment in 1 week. Wound #7 Left,Plantar Metatarsal head first o Return Appointment in 1 week. Edema Control Wound #4 Right,Plantar Metatarsal head first o Elevate  legs to the level of the heart and pump ankles as often as possible Wound #5 Right,Plantar Metatarsal head third o Elevate legs to the level of the heart and pump ankles as often as possible Wound #7 Left,Plantar Metatarsal head first o Elevate legs to the level of the heart and pump ankles as often as possible Off-Loading Wound #4 Right,Plantar Metatarsal head first o Other: - front off loader Wound #5 Right,Plantar Metatarsal head third o Other: - front off loader [name], [name] ([id number]) Wound #7 Left,Plantar Metatarsal head first o Other: - front off loader Additional Orders / Instructions Wound #4 Right,Plantar Metatarsal head first o Increase protein intake. Wound #5 Right,Plantar Metatarsal head third o Increase protein intake. Wound #7 Left,Plantar Metatarsal  head first o Increase protein intake. Home Health Wound #4 Right,Plantar Metatarsal head first o Continue [facility] Visits - [facility] o Home Health Nurse may visit PRN to address patientos wound care needs. o FACE TO FACE ENCOUNTER: MEDICARE and MEDICAID PATIENTS: I certify that this patient is under my care and that I had a face-to-face encounter that meets the physician face-to-face encounter requirements with this patient on this date. The encounter with the patient was in whole or in part for the following MEDICAL CONDITION: (primary reason for Home Healthcare) MEDICAL NECESSITY: I certify, that based on my findings, NURSING services are a medically necessary home health service. HOME BOUND STATUS: I certify that my clinical findings support that this patient is homebound (i.e., Due to illness or injury, pt requires aid of supportive devices such as crutches, cane, wheelchairs, walkers, the use of special transportation or the assistance of another person to leave their place of residence. There is a normal inability to leave the home and doing so requires considerable and taxing  effort. Other absences are for medical reasons / religious services and are infrequent or of short duration when for other reasons). o If current dressing causes regression in wound condition, may D/C ordered dressing product/s and apply Normal Saline Moist Dressing daily until next Wound Healing Center / Other MD appointment. Notify Wound Healing Center of regression in wound condition at [phone]. o Please direct any NON-WOUND related issues/requests for orders to patient's Primary Care Physician Wound #5 Right,Plantar Metatarsal head third o [facility] Visits - [facility] o Home Health Nurse may visit PRN to address patientos wound care needs. o FACE TO FACE ENCOUNTER: MEDICARE and MEDICAID PATIENTS: I certify that this patient is under my care and that I had a face-to-face encounter that meets the physician face-to-face encounter requirements with this patient on this date. The encounter with the patient was in whole or in part for the following MEDICAL CONDITION: (primary reason for Home Healthcare) MEDICAL NECESSITY: I certify, that based on my findings, NURSING services are a medically necessary home health service. HOME BOUND STATUS: I certify that my clinical findings support that this patient is homebound (i.e., Due to illness or injury, pt requires aid of supportive devices such as crutches, cane, wheelchairs, walkers, the use of special transportation or the assistance of another person to leave their place of residence. There is a normal inability to leave the home and doing so requires considerable and taxing effort. Other [name], [name] ([id number]) absences are for medical reasons / religious services and are infrequent or of short duration when for other reasons). o If current dressing causes regression in wound condition, may D/C ordered dressing product/s and apply Normal Saline Moist Dressing daily until next Wound Healing Center / Other  MD appointment. Notify Wound Healing Center of regression in wound condition at [phone]. o Please direct any NON-WOUND related issues/requests for orders to patient's Primary Care Physician Wound #7 Left,Plantar Metatarsal head first o [facility] Visits - [facility] o Home Health Nurse may visit PRN to address patientos wound care needs. o FACE TO FACE ENCOUNTER: MEDICARE and MEDICAID PATIENTS: I certify that this patient is under my care and that I had a face-to-face encounter that meets the physician face-to-face encounter requirements with this patient on this date. The encounter with the patient was in whole or in part for the following MEDICAL CONDITION: (primary reason for Home Healthcare) MEDICAL NECESSITY: I certify, that based on my  findings, NURSING services are a medically necessary home health service. HOME BOUND STATUS: I certify that my clinical findings support that this patient is homebound (i.e., Due to illness or injury, pt requires aid of supportive devices such as crutches, cane, wheelchairs, walkers, the use of special transportation or the assistance of another person to leave their place of residence. There is a normal inability to leave the home and doing so requires considerable and taxing effort. Other absences are for medical reasons / religious services and are infrequent or of short duration when for other reasons). o If current dressing causes regression in wound condition, may D/C ordered dressing product/s and apply Normal Saline Moist Dressing daily until next Wound Healing Center / Other MD appointment. Notify Wound Healing Center of regression in wound condition at [phone]. o Please direct any NON-WOUND related issues/requests for orders to patient's Primary Care Physician Electronic Signature(s) Signed: 07/28/2016 5:26:18 PM By: Baltazar Najjar MD Signed: 07/28/2016 9:16:41 PM By: Elliot Gurney, BSN, RN, CWS, Kim RN, BSN Entered By:  Elliot Gurney, BSN, RN, CWS, Kim on 07/28/2016 11:56:04 [name], [name] ([id number]) -------------------------------------------------------------------------------- Problem List Details Patient Name: [name] Date of Service: 07/28/2016 11:15 AM Medical Record Number: [id number] Patient Account Number: [id number] Date of Birth/Sex: 17-Apr-1934 (81 y.o. 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 Male) Treating RN: Huel Coventry Primary Care Provider: Baruch Gouty Other Clinician: Referring Provider: Baruch Gouty Treating Provider/Extender: Maxwell Caul [place] in Treatment: 2 Subjective History of Present Illness (HPI) The following HPI elements were documented for the patient's wound: Location: ulcers on both feet Quality: Patient reports No Pain. Severity: Patient states wound are getting worse. Duration: Patient states that they are not certain how long the wound has been present. Context: The wound appeared gradually over  time Modifying Factors: Consults to this date include: was seen by his PCP recently. Associated Signs and Symptoms: Patient reports having difficulty standing for long periods. 82 year old patient was recently seen by his PCP Dr. Baruch Gouty, who noted a large tunneling ulcer and blister on the left foot extending medially near the base of the toe. There were also 2 small ulcers on the right foot. his most recent hemoglobin A1c was 5.8 He has been placed on clindamycin recently He has been treated by her for congestive heart failure and was recently put on clindamycin for the abscesses and ulceration of the foot. Past medical history has been significant for diabetes mellitus, coronary artery disease, congestive heart failure and ischemic cardiomyopathy with status post amputation of left fourth finger, appendectomy, coronary artery bypass graft, defibrillator placement. Never been a smoker. most recent laboratory investigations done showed WBC of 8000, hemoglobin of 13.2 hematocrit of 38.3 and platelets of 253 with a normal BMP. No recent x-rays have been done ====  Old notes: 82 year old gentleman with a past medical history of COPD, GERD, hyperlipidemia, hypertension, arthritis, peripheral neuropathy and diabetes mellitus was seen in the ER recently and worked up for bilateral feet ulceration. He was also seen recently by Dr. Baruch Gouty with whom he went to establish care in early September. He also sees a podiatrist Dr. Graciela Husbands and there is some question of whether he had osteomyelitis in the past. 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 Most recent x-rays done in the ER on 10/23/2014 did not show any evidence of osteomyelitis both feet. The patient does not check his blood sugar, does not take treatment regularly and is very difficult to understand. We will make a call to his daughter who lives in town and  see if we can get some better history from her. 11/01/2014 -- he is here with his daughter today and she says she is going to help him see the podiatrist SIMCHA, SPEIR ([id number]) and get some appointments for surgical care. She also understands that he has to check his blood glucose regularly at home and control his diabetes. 11/08/2014 -- he was seen by Dr. Graciela Husbands the podiatrist who had x-rays done -- the right foot showed no clear evidence of osteomyelitis but there was evidence of peripheral vascular disease. the left foot all show showed no clear evidence of osteomyelitis but there was peripheral vascular disease. Dr. Alberteen Spindle had debrided tissue from the right foot and applied some local Bactroban and sterile bandage and debrided all the toenails and give him instruction to come back when necessary. ==== 07/28/16; patient arrives today in my clinic upset that he has not been seen by home health [[facility]]. He had an x-ray of the left foot that was negative for osteomyelitis. He has been walking long distances on both of these feet which I talked to him about today. Objective Constitutional Sitting or standing Blood Pressure is within target range for patient.. Pulse regular and within target range for patient.[name] Respirations regular, non-labored and within target range.. Temperature is normal and within the target range for the patient.[name] appears in no distress. Vitals Time Taken: 11:22 AM, Height: 72 in, Weight: 227 lbs, BMI: 30.8, Temperature: 97.5 F, Pulse: 75 bpm, Respiratory Rate: 16 breaths/min, Blood Pressure: 128/77 mmHg. Cardiovascular Pedal pulses are palpable. General Notes: Wound exam; the left foot wound on the plantar aspect of his first metatarsal has considerable undermining. Using pickups and scalpel and removed nonviable skin and circumferential subcutaneous tissue. The base of this is boggy not particularly healthy. I did not palpate any bone. The 2 wounds on the  right forefoot also have some undermining removed with pickups and a scalpel but they are more superficial and do not appear to be unhealthy. My feeling here is that if we offloaded this adequately these would probably close Integumentary (Hair, Skin) Wound #4 status is Open. Original cause of wound was Gradually Appeared. The wound is located on the Right,Plantar Metatarsal head first. The wound measures 0.6cm length x 0.4cm width x 0.3cm depth; 0.188cm^2 area and 0.057cm^3 volume. There is no tunneling or undermining noted. There is a large amount of serous drainage noted. The wound margin is flat and intact. There is medium (34-66%) pink granulation within the wound bed. There is a medium (34-66%) amount of necrotic tissue within the wound bed including Adherent Slough. The periwound skin appearance exhibited: Callus, Maceration, Ecchymosis. Periwound temperature was noted as  No Abnormality. The periwound has tenderness on palpation. Wound #5 status is Open. Original cause of wound was Gradually Appeared. The wound is located on the ESCO, JOSLYN. ([id number]) Right,Plantar Metatarsal head third. The wound measures 0.7cm length x 0.5cm width x 0.2cm depth; 0.275cm^2 area and 0.055cm^3 volume. There is no tunneling or undermining noted. There is a large amount of serous drainage noted. The wound margin is flat and intact. There is medium (34-66%) Kenneth granulation within the wound bed. There is a medium (34-66%) amount of necrotic tissue within the wound bed including Adherent Slough. The periwound skin appearance exhibited: Callus. The periwound skin appearance did not exhibit: Crepitus, Excoriation, Induration, Rash, Scarring, Dry/Scaly, Maceration, Atrophie Blanche, Cyanosis, Ecchymosis, Hemosiderin Staining, Mottled, Pallor, Rubor, Erythema. Periwound temperature was noted as No Abnormality. The periwound has tenderness on palpation. Wound #6 status is Healed - Epithelialized. Original  cause of wound was Gradually Appeared. The wound is located on the Right,Plantar Metatarsal head fifth. The wound measures 0cm length x 0cm width x 0cm depth; 0cm^2 area and 0cm^3 volume. Wound #7 status is Open. Original cause of wound was Gradually Appeared. The wound is located on the Left,Plantar Metatarsal head first. The wound measures 1.3cm length x 1.2cm width x 0.1cm depth; 1.225cm^2 area and 0.123cm^3 volume. There is Fat Layer (Subcutaneous Tissue) Exposed exposed. There is no tunneling noted, however, there is undermining starting at 12:00 and ending at 12:00 with a maximum distance of 0.5cm. There is a large amount of serous drainage noted. The wound margin is flat and intact. There is no granulation within the wound bed. There is a large (67-100%) amount of necrotic tissue within the wound bed including Adherent Slough. The periwound skin appearance exhibited: Callus, Dry/Scaly, Erythema. The periwound skin appearance did not exhibit: Crepitus, Excoriation, Induration, Rash, Scarring, Maceration, Atrophie Blanche, Cyanosis, Ecchymosis, Hemosiderin Staining, Mottled, Pallor, Rubor. The surrounding wound skin color is noted with erythema. Periwound temperature was noted as No Abnormality. The periwound has tenderness on palpation. 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 There was a Skin/Subcutaneous Tissue Debridement ([id number]) debridement with total area of 0.24 sq cm performed by Maxwell Caul, MD. with the following instrument(s): Forceps and  Scissors to remove Viable and Non-Viable tissue/material including Callus and Subcutaneous after achieving pain Hoben, Nikos E. ([id number]) control using Other (lidocaine 4%). A time out was conducted at 11:45, prior to the start of the procedure. A Moderate amount of bleeding was controlled with Silver Nitrate. The procedure was tolerated well with a pain level of Insensate throughout and a pain level of Insensate following the procedure. Post Debridement Measurements: 0.4cm length x 0.3cm width x 0.1cm depth; 0.009cm^3 volume. Character of Wound/Ulcer Post Debridement requires further debridement. Severity of Tissue Post Debridement is: Fat layer exposed. Post procedure Diagnosis Wound #4: Same as Pre-Procedure Wound #7 Pre-procedure diagnosis of Wound #7 is a Diabetic Wound/Ulcer of the Lower Extremity located on the Left,Plantar Metatarsal head first .Severity of Tissue Pre Debridement is: Fat layer exposed. There was a Skin/Subcutaneous Tissue Debridement ([id number]) debridement with total area of 1.56 sq cm performed by  Maxwell Caul, MD. with the following instrument(s): Forceps and Scissors to remove Viable and Non-Viable tissue/material including Callus and Subcutaneous after achieving pain control using Other (lidocaine 4%). A time out was conducted at 11:45, prior to the start of the procedure. A Moderate amount of bleeding was controlled with Silver Nitrate. The procedure was tolerated well with a pain level of Insensate throughout and a pain level of Insensate following the procedure. Post Debridement Measurements: 1.6cm length x 1.8cm width x 0.5cm depth; 1.131cm^3 volume. Character of Wound/Ulcer Post Debridement requires further debridement. Severity of Tissue Post Debridement is: Fat layer exposed. Post procedure Diagnosis Wound #7: Same as Pre-Procedure Plan Wound Cleansing: Wound #4 Right,Plantar Metatarsal head first: Clean wound with Normal Saline. May Shower,  gently pat wound dry prior to applying new dressing. Wound #5 Right,Plantar Metatarsal head third: Clean wound with Normal Saline. May Shower, gently pat wound dry prior to applying new dressing. Wound #7 Left,Plantar Metatarsal head first: Clean wound with Normal Saline. May Shower, gently pat wound dry prior to applying new dressing. Anesthetic: Wound #4 Right,Plantar Metatarsal head first: Topical Lidocaine 4% cream applied to wound bed prior to debridement Wound #5 Right,Plantar Metatarsal head third: Topical Lidocaine 4% cream applied to wound bed prior to debridement Wound #7 Left,Plantar Metatarsal head first: Topical Lidocaine 4% cream applied to wound bed prior to debridement Primary Wound Dressing: Wound #4 Right,Plantar Metatarsal head first: Aquacel Ag - rope, pack lightly into wound Cirelli, Carlous E. ([id number]) Wound #5 Right,Plantar Metatarsal head third: Aquacel Ag - rope, pack lightly into wound Wound #7 Left,Plantar Metatarsal head first: Aquacel Ag - rope, pack lightly into wound Secondary Dressing: Wound #4 Right,Plantar Metatarsal head first: ABD pad Dry Gauze Conform/Kerlix Wound #5 Right,Plantar Metatarsal head third: ABD pad Dry Gauze Conform/Kerlix Wound #7 Left,Plantar Metatarsal head first: ABD pad Dry Gauze Conform/Kerlix Dressing Change Frequency: Wound #4 Right,Plantar Metatarsal head first: Change Dressing Monday, Wednesday, Friday Wound #5 Right,Plantar Metatarsal head third: Change Dressing Monday, Wednesday, Friday Wound #7 Left,Plantar Metatarsal head first: Change Dressing Monday, Wednesday, Friday Follow-up Appointments: Wound #4 Right,Plantar Metatarsal head first: Return Appointment in 1 week. Wound #5 Right,Plantar Metatarsal head third: Return Appointment in 1 week. Wound #7 Left,Plantar Metatarsal head first: Return Appointment in 1 week. Edema Control: Wound #4 Right,Plantar Metatarsal head first: Elevate legs to the level  of the heart and pump ankles as often as possible Wound #5 Right,Plantar Metatarsal head third: Elevate legs to the level of the heart and pump ankles as often as possible Wound #7 Left,Plantar Metatarsal head first: Elevate legs to the level of the heart and pump ankles as often as possible Off-Loading: Wound #4 Right,Plantar Metatarsal head first: Other: - front off loader Wound #5 Right,Plantar Metatarsal head third: Other: - front off loader Wound #7 Left,Plantar Metatarsal head first: Other: - front off loader Additional Orders / Instructions: Wound #4 Right,Plantar Metatarsal head first: Increase protein intake. Wound #5 Right,Plantar Metatarsal head third: Increase protein intake. Wound #7 Left,Plantar Metatarsal head first: ESPIRIDION, SUPINSKI. ([id number]) Increase protein intake. Home Health: Wound #4 Right,Plantar Metatarsal head first: Continue Home Health Visits - [facility] may visit PRN to address patient s wound care needs. FACE TO FACE ENCOUNTER: MEDICARE and MEDICAID PATIENTS: I certify that this patient is under my care and that I had a face-to-face encounter that meets the physician face-to-face encounter requirements with this patient on this date. The encounter with the patient was in whole or in part  for the following MEDICAL CONDITION: (primary reason for Home Healthcare) MEDICAL NECESSITY: I certify, that based on my findings, NURSING services are a medically necessary home health service. HOME BOUND STATUS: I certify that my clinical findings support that this patient is homebound (i.e., Due to illness or injury, pt requires aid of supportive devices such as crutches, cane, wheelchairs, walkers, the use of special transportation or the assistance of another person to leave their place of residence. There is a normal inability to leave the home and doing so requires considerable and taxing effort. Other absences are for medical reasons /  religious services and are infrequent or of short duration when for other reasons). If current dressing causes regression in wound condition, may D/C ordered dressing product/s and apply Normal Saline Moist Dressing daily until next Wound Healing Center / Other MD appointment. Notify Wound Healing Center of regression in wound condition at [phone]. Please direct any NON-WOUND related issues/requests for orders to patient's Primary Care Physician Wound #5 Right,Plantar Metatarsal head third: Continue Home Health Visits - [facility] Nurse may visit PRN to address patient s wound care needs. FACE TO FACE ENCOUNTER: MEDICARE and MEDICAID PATIENTS: I certify that this patient is under my care and that I had a face-to-face encounter that meets the physician face-to-face encounter requirements with this patient on this date. The encounter with the patient was in whole or in part for the following MEDICAL CONDITION: (primary reason for Home Healthcare) MEDICAL NECESSITY: I certify, that based on my findings, NURSING services are a medically necessary home health service. HOME BOUND STATUS: I certify that my clinical findings support that this patient is homebound (i.e., Due to illness or injury, pt requires aid of supportive devices such as crutches, cane, wheelchairs, walkers, the use of special transportation or the assistance of another person to leave their place of residence. There is a normal inability to leave the home and doing so requires considerable and taxing effort. Other absences are for medical reasons / religious services and are infrequent or of short duration when for other reasons). If current dressing causes regression in wound condition, may D/C ordered dressing product/s and apply Normal Saline Moist Dressing daily until next Wound Healing Center / Other MD appointment. Notify Wound Healing Center of regression in wound condition at [phone]. Please direct any  NON-WOUND related issues/requests for orders to patient's Primary Care Physician Wound #7 Left,Plantar Metatarsal head first: Continue Home Health Visits - [facility] Nurse may visit PRN to address patient s wound care needs. FACE TO FACE ENCOUNTER: MEDICARE and MEDICAID PATIENTS: I certify that this patient is under my care and that I had a face-to-face encounter that meets the physician face-to-face encounter requirements with this patient on this date. The encounter with the patient was in whole or in part for the following MEDICAL CONDITION: (primary reason for Home Healthcare) MEDICAL NECESSITY: I certify, that based on my findings, NURSING services are a medically necessary home health service. HOME BOUND STATUS: I certify that my clinical findings support that this patient is homebound (i.e., Due to illness or injury, pt requires aid of supportive devices such as crutches, cane, wheelchairs, walkers, the use of special transportation or the assistance of another person to leave their place of residence. There is a normal inability to leave the home and doing so requires considerable and taxing effort. Other absences are for medical reasons / religious services and are infrequent or of short duration when for other  reasons). If current dressing causes regression in wound condition, may D/C ordered dressing product/s and apply Normal Saline Moist Dressing daily until next Wound Healing Center / Other MD appointment. Notify Wound Jeanlouis, RUI WORDELL. ([id number]) Healing Center of regression in wound condition at [phone]. Please direct any NON-WOUND related issues/requests for orders to patient's Primary Care Physician #1 we continued with silver alginate based dressings. #2 he has healing sandals. I've counseled him to limit the amount of time he spends on his feet to what is absolutely necessary #3 in spite of the negative x-ray the wound on the left metatarsal heads is  worrisome. I suspect he is going to need an MRI of this area although I did not order this today. #4 our staff contacted home health. They said he has not been answering the phone. Patient was upset about this today. Electronic Signature(s) Signed: 07/28/2016 5:26:18 PM By: Baltazar Najjar MD Entered By: Baltazar Najjar on 07/28/2016 12:50:41 Weidemann, [name] ([id number]) -------------------------------------------------------------------------------- SuperBill Details Patient Name: [name] Date of Service: 07/28/2016 Medical Record Number: [id number] Patient Account Number: [id number] Date of Birth/Sex: 1934-04-25 (81 y.o. 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 Facility Procedures CPT4 Code Description: [id number] 11042 - DEB SUBQ TISSUE 20 SQ CM/< ICD-10 Description Diagnosis E11.621 Type 2 diabetes mellitus with foot ulcer L97.512 Non-pressure chronic ulcer of other part of right foo L97.522 Non-pressure chronic ulcer of other  part of left foot Modifier: t with fat la with fat lay Quantity: 1 yer exposed er exposed Physician Procedures CPT4 Code Description: 2440102 11042 - WC PHYS SUBQ TISS 20 SQ CM ICD-10 Description Diagnosis E11.621 Type 2 diabetes mellitus with foot ulcer L97.512 Non-pressure chronic ulcer of other part of right foo L97.522 Non-pressure chronic ulcer of other part  of left foot Modifier: t with fat lay with fat laye Quantity: 1 er exposed r exposed Electronic Signature(s) Signed: 07/28/2016 5:26:18 PM By: Baltazar Najjar MD Entered By:  Baltazar Najjar on 07/28/2016 12:50:54

## 2016-07-30 NOTE — Progress Notes (Addendum)
MIKKEL, CHARRETTE ([id number]) Visit Report for 07/28/2016 Arrival Information Details Patient Name: NIRANJAN, RUFENER Date of Service: 07/28/2016 11:15 AM Medical Record Number: [id number] Patient Account Number: [id number] Date of Birth/Sex: 1934-08-05 (82 y.o. Male) Treating RN: Huel Coventry Primary Care Lilac Hoff: Baruch Gouty Other Clinician: Referring Jaedon Siler: Baruch Gouty Treating Beryl Balz/Extender: Altamese [place] in Treatment: 2 Visit Information History Since Last Visit Added or deleted any medications: No Patient Arrived: Ambulatory Any new allergies or adverse reactions: No Arrival Time: 11:20 Had a fall or experienced change in No Accompanied By: pastor activities of daily living that may affect Transfer Assistance: None risk of falls: Patient Identification Verified: Yes Signs or symptoms of abuse/neglect since last No Secondary Verification Process Yes visito Completed: Hospitalized since last visit: No Patient Requires Transmission- No Has Dressing in Place as Prescribed: Yes Based Precautions: Pain Present Now: No Patient Has Alerts: Yes Patient Alerts: Patient on Blood Thinner Type II Diabetic Electronic Signature(s) Signed: 07/28/2016 9:16:41 PM By: Elliot Gurney, BSN, RN, CWS, Kim RN, BSN Entered By: Elliot Gurney, BSN, RN, CWS, Kim on 07/28/2016 11:21:30 Agent, Irma Newness ([id number]) -------------------------------------------------------------------------------- Encounter Discharge Information Details Patient Name: [name] Date of Service: 07/28/2016 11:15 AM Medical Record Number: [id number] Patient Account Number: [id number] Date of Birth/Sex: 06-25-1934 (82 y.o. Male) Treating RN: Huel Coventry Primary Care Hae Ahlers: Baruch Gouty Other Clinician: Referring Eddith Mentor: Baruch Gouty Treating Adisa Vigeant/Extender: Altamese [place] in Treatment: 2 Encounter Discharge Information Items Discharge Pain Level: 0 Discharge Condition:  Stable Ambulatory Status: Ambulatory Discharge Destination: Home Transportation: Private Auto Accompanied By: pastor in lobby Schedule Follow-up Appointment: Yes Medication Reconciliation completed and provided to Patient/Care Yes Randeep Biondolillo: Provided on Clinical Summary of Care: 07/28/2016 Form Type Recipient Paper Patient AF Electronic Signature(s) Signed: 07/28/2016 12:34:29 PM By: Elliot Gurney, BSN, RN, CWS, Kim RN, BSN Previous Signature: 07/28/2016 12:05:07 PM Version By: Gwenlyn Perking Entered By: Elliot Gurney, BSN, RN, CWS, Kim on 07/28/2016 12:34:29 Ritsema, Irma Newness ([id number]) -------------------------------------------------------------------------------- Lower Extremity Assessment Details Patient Name: [name] Date of Service: 07/28/2016 11:15 AM Medical Record Number: [id number] Patient Account Number: [id number] Date of Birth/Sex: 1934-06-22 (82 y.o. Male) Treating RN: Huel Coventry Primary Care Rashelle Ireland: Baruch Gouty Other Clinician: Referring Artin Mceuen: Baruch Gouty Treating Xoey Warmoth/Extender: Maxwell Caul Weeks in Treatment: 2 Vascular Assessment Pulses: Dorsalis Pedis Palpable: [Left:No] [Right:No] Doppler Audible: [Left:Yes] [Right:Yes] Posterior Tibial Extremity colors, hair growth, and conditions: Extremity Color: [Left:Normal] [Right:Normal] Hair Growth on Extremity: [Left:No] [Right:No] Temperature of Extremity: [Left:Warm] [Right:Warm] Capillary Refill: [Left:> 3 seconds] [Right:> 3 seconds] Dependent Rubor: [Left:No] [Right:No] Blanched when Elevated: [Left:No] [Right:No] Lipodermatosclerosis: [Right:No] Toe Nail Assessment Left: Right: Thick: Yes Yes Discolored: Yes Yes Deformed: Yes Yes Improper Length and Hygiene: Yes Yes Electronic Signature(s) Signed: 07/28/2016 9:16:41 PM By: Elliot Gurney, BSN, RN, CWS, Kim RN, BSN Entered By: Elliot Gurney, BSN, RN, CWS, Kim on 07/28/2016 11:35:07 Hartsough, Irma Newness  ([id number]) -------------------------------------------------------------------------------- Multi Wound Chart Details Patient Name: [name] Date of Service: 07/28/2016 11:15 AM Medical Record Number: [id number] Patient Account Number: [id number] Date of Birth/Sex: 04-30-1934 (82 y.o. Male) Treating RN: Huel Coventry Primary Care Keaston Pile: Baruch Gouty Other Clinician: Referring Felicidad Sugarman: Baruch Gouty Treating Eartha Vonbehren/Extender: Maxwell Caul Weeks in Treatment: 2 Vital Signs Height(in): 72 Pulse(bpm): 75 Weight(lbs): 227 Blood Pressure 128/77 (mmHg): Body Mass Index(BMI): 31 Temperature(F): 97.5 Respiratory Rate 16 (breaths/min): Photos: Wound Location: Right Metatarsal head first Right Metatarsal head Right, Plantar Metatarsal - Plantar third - Plantar head fifth Wounding Event: Gradually Appeared Gradually Appeared Gradually Appeared Primary Etiology:  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 the Lower Extremity the Lower Extremity Comorbid History: Chronic Obstructive Chronic Obstructive N/A Pulmonary Disease Pulmonary Disease (COPD), Coronary Artery (COPD), Coronary Artery Disease, Hypertension, Disease, Hypertension, Type II Diabetes, End Type II Diabetes, End Stage Renal Disease, Stage Renal Disease, Neuropathy Neuropathy Date Acquired: 06/25/2016 06/25/2016 06/25/2016 Weeks of Treatment: 2 2 2  Wound Status: Open Open Healed - Epithelialized Clustered Wound: No No No Measurements L x W x D 0.6x0.4x0.3 0.7x0.5x0.2 0x0x0 (cm) Area (cm) : 0.188 0.275 0 Volume (cm) : 0.057 0.055 0 % Reduction in Area: 52.20% -40.30% 100.00% % Reduction in Volume: 51.70% 53.40% 100.00% Undermining: No No N/A Classification: Grade 2 Grade 2 Grade 1 Exudate Amount: Large Large N/A Zeller, Ugonna E. ([id number]) Exudate Type: Serous Serous N/A Exudate Color: amber amber N/A Wound Margin: Flat and Intact Flat and Intact  N/A Granulation Amount: Medium (34-66%) Medium (34-66%) N/A Granulation Quality: Pink Red N/A Necrotic Amount: Medium (34-66%) Medium (34-66%) N/A Exposed Structures: Fascia: No Fascia: No N/A Fat Layer (Subcutaneous Fat Layer (Subcutaneous Tissue) Exposed: No Tissue) Exposed: No Tendon: No Tendon: No Muscle: No Muscle: No Joint: No Joint: No Bone: No Bone: No Epithelialization: None None N/A Periwound Skin Texture: Callus: Yes Callus: Yes No Abnormalities Noted Excoriation: No Induration: No Crepitus: No Rash: No Scarring: No Periwound Skin Maceration: Yes Maceration: No No Abnormalities Noted Moisture: Dry/Scaly: No Periwound Skin Color: Ecchymosis: Yes Atrophie Blanche: No No Abnormalities Noted Cyanosis: No Ecchymosis: No Erythema: No Hemosiderin Staining: No Mottled: No Pallor: No Rubor: No Temperature: No Abnormality No Abnormality N/A Tenderness on Yes Yes No Palpation: Wound Preparation: Ulcer Cleansing: Ulcer Cleansing: N/A Rinsed/Irrigated with Rinsed/Irrigated with Saline Saline Topical Anesthetic Topical Anesthetic Applied: Other: lidocaine Applied: Other: lidocaine 4% 4% Wound Number: 7 N/A N/A Photos: N/A N/A Wound Location: Left Metatarsal head first - N/A N/A Plantar Wounding Event: Gradually Appeared N/A N/A JAQUAVIUS, HUDLER ([id number]) Primary Etiology: 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 Comorbid History: Chronic Obstructive N/A N/A Pulmonary Disease (COPD), Coronary Artery Disease, Hypertension, Type II Diabetes, End Stage Renal Disease, Neuropathy Date Acquired: 07/02/2016 N/A N/A Weeks of Treatment: 2 N/A N/A Wound Status: Open N/A N/A Clustered Wound: Yes N/A N/A Measurements L x W x D 1.3x1.2x0.1 N/A N/A (cm) Area (cm) : 1.225 N/A N/A Volume (cm) : 0.123 N/A N/A % Reduction in Area: 13.40% N/A N/A % Reduction in Volume: 71.00% N/A N/A Starting Position 1 12 (o'clock): Ending Position 1  12 (o'clock): Maximum Distance 1 0.5 (cm): Undermining: Yes N/A N/A Classification: Grade 2 N/A N/A Exudate Amount: Large N/A N/A Exudate Type: Serous N/A N/A Exudate Color: amber N/A N/A Wound Margin: Flat and Intact N/A N/A Granulation Amount: None Present (0%) N/A N/A Granulation Quality: N/A N/A N/A Necrotic Amount: Large (67-100%) N/A N/A Exposed Structures: Fat Layer (Subcutaneous N/A N/A Tissue) Exposed: Yes Fascia: No Tendon: No Muscle: No Joint: No Bone: No Epithelialization: None N/A N/A Periwound Skin Texture: Callus: Yes N/A N/A Excoriation: No Induration: No Crepitus: No Rash: No Scarring: No N/A N/A Ricke, Rishawn E. ([id number]) Periwound Skin Dry/Scaly: Yes Moisture: Maceration: No Periwound Skin Color: Erythema: Yes N/A N/A Atrophie Blanche: No Cyanosis: No Ecchymosis: No Hemosiderin Staining: No Mottled: No Pallor: No Rubor: No Temperature: No Abnormality N/A N/A Tenderness on Yes N/A N/A Palpation: Wound Preparation: Ulcer Cleansing: N/A N/A Rinsed/Irrigated with Saline Topical Anesthetic Applied: Other: lidocaine 4% Treatment Notes Electronic Signature(s) Signed: 07/28/2016 9:16:41 PM By:  Elliot Gurney, BSN, RN, CWS, Kim RN, BSN Entered By: Elliot Gurney, BSN, RN, CWS, Kim on 07/28/2016 11:49:30 Scholze, Irma Newness ([id number]) -------------------------------------------------------------------------------- Multi-Disciplinary Care Plan Details Patient Name: [name] Date of Service: 07/28/2016 11:15 AM Medical Record Number: [id number] Patient Account Number: [id number] Date of Birth/Sex: 12-18-1934 (82 y.o. Male) Treating RN: Huel Coventry Primary Care Sherrell Weir: Baruch Gouty Other Clinician: Referring Meela Wareing: Baruch Gouty Treating Dustin Burrill/Extender: Altamese [place] in Treatment: 2 Active Inactive Electronic Signature(s) Signed: 09/16/2016 5:39:58 PM By: Elliot Gurney, BSN, RN, CWS, Kim RN, BSN Previous Signature: 07/28/2016 9:16:41 PM  Version By: Elliot Gurney, BSN, RN, CWS, Kim RN, BSN Entered By: Elliot Gurney, BSN, RN, CWS, Kim on 09/07/2016 09:49:29 Hislop, Irma Newness ([id number]) -------------------------------------------------------------------------------- Pain Assessment Details Patient Name: [name] Date of Service: 07/28/2016 11:15 AM Medical Record Number: [id number] Patient Account Number: [id number] Date of Birth/Sex: 04-24-1934 (82 y.o. Male) Treating RN: Huel Coventry Primary Care Kirk Basquez: Baruch Gouty Other Clinician: Referring Cambridge Deleo: Baruch Gouty Treating Arayah Krouse/Extender: Maxwell Caul Weeks in Treatment: 2 Active Problems Location of Pain Severity and Description of Pain Patient Has Paino No Site Locations With Dressing Change: No Pain Management and Medication Current Pain Management: Electronic Signature(s) Signed: 07/28/2016 9:16:41 PM By: Elliot Gurney, BSN, RN, CWS, Kim RN, BSN Entered By: Elliot Gurney, BSN, RN, CWS, Kim on 07/28/2016 11:21:37 Grismore, Irma Newness ([id number]) -------------------------------------------------------------------------------- Patient/Caregiver Education Details Patient Name: [name] Date of Service: 07/28/2016 11:15 AM Medical Record Number: [id number] Patient Account Number: [id number] Date of Birth/Gender: 04-May-1934 (82 y.o. Male) Treating RN: Huel Coventry Primary Care Physician: Baruch Gouty Other Clinician: Referring Physician: Baruch Gouty Treating Physician/Extender: Altamese [place] in Treatment: 2 Education Assessment Education Provided To: Patient Education Topics Provided Wound/Skin Impairment: Handouts: Caring for Your Ulcer, Other: continue wound care as prescribed Methods: Demonstration Responses: State content correctly Electronic Signature(s) Signed: 07/28/2016 9:16:41 PM By: Elliot Gurney, BSN, RN, CWS, Kim RN, BSN Entered By: Elliot Gurney, BSN, RN, CWS, Kim on 07/28/2016 12:35:46 Fouse, Irma Newness  ([id number]) -------------------------------------------------------------------------------- Wound Assessment Details Patient Name: [name] Date of Service: 07/28/2016 11:15 AM Medical Record Number: [id number] Patient Account Number: [id number] Date of Birth/Sex: 20-Apr-1934 (82 y.o. Male) Treating RN: Huel Coventry Primary Care Jhett Fretwell: Baruch Gouty Other Clinician: Referring Fredie Majano: Baruch Gouty Treating Fatema Rabe/Extender: Maxwell Caul Weeks in Treatment: 2 Wound Status Wound Number: 4 Primary Diabetic Wound/Ulcer of the Lower Etiology: Extremity Wound Location: Right Metatarsal head first - Plantar Wound Open Status: Wounding Event: Gradually Appeared Comorbid Chronic Obstructive Pulmonary Disease Date Acquired: 06/25/2016 History: (COPD), Coronary Artery Disease, Weeks Of Treatment: 2 Hypertension, Type II Diabetes, End Clustered Wound: No Stage Renal Disease, Neuropathy Photos Photo Uploaded By: Elliot Gurney, BSN, RN, CWS, Kim on 07/28/2016 11:38:50 Wound Measurements Length: (cm) 0.6 Width: (cm) 0.4 Depth: (cm) 0.3 Area: (cm) 0.188 Volume: (cm) 0.057 % Reduction in Area: 52.2% % Reduction in Volume: 51.7% Epithelialization: None Tunneling: No Undermining: No Wound Description Classification: Grade 2 Wound Margin: Flat and Intact Exudate Amount: Large Exudate Type: Serous Exudate Color: amber Foul Odor After Cleansing: No Slough/Fibrino No Wound Bed Granulation Amount: Medium (34-66%) Exposed Structure Granulation Quality: Pink Fascia Exposed: No Necrotic Amount: Medium (34-66%) Fat Layer (Subcutaneous Tissue) Exposed: No Necrotic Quality: Adherent Slough Tendon Exposed: No Muscle Exposed: No Weitman, Pope E. ([id number]) Joint Exposed: No Bone Exposed: No Periwound Skin Texture Texture Color No Abnormalities Noted: No No Abnormalities Noted: No Callus: Yes Ecchymosis: Yes Moisture Temperature / Pain No Abnormalities Noted:  No Temperature: No Abnormality Maceration: Yes Tenderness on  Palpation: Yes Wound Preparation Ulcer Cleansing: Rinsed/Irrigated with Saline Topical Anesthetic Applied: Other: lidocaine 4%, Electronic Signature(s) Signed: 07/28/2016 9:16:41 PM By: Elliot Gurney, BSN, RN, CWS, Kim RN, BSN Entered By: Elliot Gurney, BSN, RN, CWS, Kim on 07/28/2016 11:30:19 Erb, Irma Newness ([id number]) -------------------------------------------------------------------------------- Wound Assessment Details Patient Name: [name] Date of Service: 07/28/2016 11:15 AM Medical Record Number: [id number] Patient Account Number: [id number] Date of Birth/Sex: 1934/06/14 (82 y.o. Male) Treating RN: Huel Coventry Primary Care Deriona Altemose: Baruch Gouty Other Clinician: Referring Emileigh Kellett: Baruch Gouty Treating Chukwudi Ewen/Extender: Maxwell Caul Weeks in Treatment: 2 Wound Status Wound Number: 5 Primary Diabetic Wound/Ulcer of the Lower Etiology: Extremity Wound Location: Right Metatarsal head third - Plantar Wound Open Status: Wounding Event: Gradually Appeared Comorbid Chronic Obstructive Pulmonary Disease Date Acquired: 06/25/2016 History: (COPD), Coronary Artery Disease, Weeks Of Treatment: 2 Hypertension, Type II Diabetes, End Clustered Wound: No Stage Renal Disease, Neuropathy Photos Photo Uploaded By: Elliot Gurney, BSN, RN, CWS, Kim on 07/28/2016 11:38:50 Wound Measurements Length: (cm) 0.7 Width: (cm) 0.5 Depth: (cm) 0.2 Area: (cm) 0.275 Volume: (cm) 0.055 % Reduction in Area: -40.3% % Reduction in Volume: 53.4% Epithelialization: None Tunneling: No Undermining: No Wound Description Classification: Grade 2 Wound Margin: Flat and Intact Exudate Amount: Large Exudate Type: Serous Exudate Color: amber Foul Odor After Cleansing: No Slough/Fibrino No Wound Bed Granulation Amount: Medium (34-66%) Exposed Structure Granulation Quality: Red Fascia Exposed: No Necrotic Amount: Medium (34-66%) Fat  Layer (Subcutaneous Tissue) Exposed: No Necrotic Quality: Adherent Slough Tendon Exposed: No Muscle Exposed: No Hupp, Ascencion E. ([id number]) Joint Exposed: No Bone Exposed: No Periwound Skin Texture Texture Color No Abnormalities Noted: No No Abnormalities Noted: No Callus: Yes Atrophie Blanche: No Crepitus: No Cyanosis: No Excoriation: No Ecchymosis: No Induration: No Erythema: No Rash: No Hemosiderin Staining: No Scarring: No Mottled: No Pallor: No Moisture Rubor: No No Abnormalities Noted: No Dry / Scaly: No Temperature / Pain Maceration: No Temperature: No Abnormality Tenderness on Palpation: Yes Wound Preparation Ulcer Cleansing: Rinsed/Irrigated with Saline Topical Anesthetic Applied: Other: lidocaine 4%, Electronic Signature(s) Signed: 07/28/2016 9:16:41 PM By: Elliot Gurney, BSN, RN, CWS, Kim RN, BSN Entered By: Elliot Gurney, BSN, RN, CWS, Kim on 07/28/2016 11:30:54 Oppenheimer, Irma Newness ([id number]) -------------------------------------------------------------------------------- Wound Assessment Details Patient Name: [name] Date of Service: 07/28/2016 11:15 AM Medical Record Number: [id number] Patient Account Number: [id number] Date of Birth/Sex: Jul 15, 1934 (82 y.o. Male) Treating RN: Huel Coventry Primary Care Malikai Gut: Baruch Gouty Other Clinician: Referring Marcile Fuquay: Baruch Gouty Treating Ardon Franklin/Extender: Maxwell Caul Weeks in Treatment: 2 Wound Status Wound Number: 6 Primary Diabetic Wound/Ulcer of the Lower Etiology: Extremity Wound Location: Right, Plantar Metatarsal head fifth Wound Status: Healed - Epithelialized Wounding Event: Gradually Appeared Date Acquired: 06/25/2016 Weeks Of Treatment: 2 Clustered Wound: No Photos Photo Uploaded By: Elliot Gurney, BSN, RN, CWS, Kim on 07/28/2016 11:39:10 Wound Measurements Length: (cm) 0 % Reduction Width: (cm) 0 % Reduction Depth: (cm) 0 Area: (cm) 0 Volume: (cm) 0 in Area: 100% in Volume:  100% Wound Description Classification: Grade 1 Periwound Skin Texture Texture Color No Abnormalities Noted: No No Abnormalities Noted: No Moisture No Abnormalities Noted: No Electronic Signature(s) Signed: 07/28/2016 9:16:41 PM By: Elliot Gurney, BSN, RN, CWS, Kim RN, BSN Entered By: Elliot Gurney, BSN, RN, CWS, Kim on 07/28/2016 11:31:10 Heaslip, RANDEEP BIONDOLILLO ([id number]) GRIFFEY, NICASIO ([id number]) -------------------------------------------------------------------------------- Wound Assessment Details Patient Name: [name] Date of Service: 07/28/2016 11:15 AM Medical Record Number: [id number] Patient Account Number: [id number] Date of Birth/Sex: 20-Sep-1934 (82 y.o. Male) Treating RN: Huel Coventry Primary Care Chalsea Darko:  Baruch GoutyLada, Melinda Other Clinician: Referring Kelbi Renstrom: Baruch GoutyLada, Melinda Treating Jericho Cieslik/Extender: Altamese CarolinaOBSON, MICHAEL G Weeks in Treatment: 2 Wound Status Wound Number: 7 Primary Diabetic Wound/Ulcer of the Lower Etiology: Extremity Wound Location: Left Metatarsal head first - Plantar Wound Open Status: Wounding Event: Gradually Appeared Comorbid Chronic Obstructive Pulmonary Disease Date Acquired: 07/02/2016 History: (COPD), Coronary Artery Disease, Weeks Of Treatment: 2 Hypertension, Type II Diabetes, End Clustered Wound: Yes Stage Renal Disease, Neuropathy Photos Photo Uploaded By: Elliot GurneyWoody, BSN, RN, CWS, Kim on 07/28/2016 11:39:10 Wound Measurements Length: (cm) 1.3 Width: (cm) 1.2 Depth: (cm) 0.1 Area: (cm) 1.225 Volume: (cm) 0.123 % Reduction in Area: 13.4% % Reduction in Volume: 71% Epithelialization: None Tunneling: No Undermining: Yes Starting Position (o'clock): 12 Ending Position (o'clock): 12 Maximum Distance: (cm) 0.5 Wound Description Classification: Grade 2 Foul Odor Aft Wound Margin: Flat and Intact Slough/Fibrin Exudate Amount: Large Exudate Type: Serous Exudate Color: amber er Cleansing: No o No Wound Bed Granulation Amount: None Present  (0%) Exposed Structure Hornsby, Treyvon E. ([id number]) Necrotic Amount: Large (67-100%) Fascia Exposed: No Necrotic Quality: Adherent Slough Fat Layer (Subcutaneous Tissue) Exposed: Yes Tendon Exposed: No Muscle Exposed: No Joint Exposed: No Bone Exposed: No Periwound Skin Texture Texture Color No Abnormalities Noted: No No Abnormalities Noted: No Callus: Yes Atrophie Blanche: No Crepitus: No Cyanosis: No Excoriation: No Ecchymosis: No Induration: No Erythema: Yes Rash: No Hemosiderin Staining: No Scarring: No Mottled: No Pallor: No Moisture Rubor: No No Abnormalities Noted: No Dry / Scaly: Yes Temperature / Pain Maceration: No Temperature: No Abnormality Tenderness on Palpation: Yes Wound Preparation Ulcer Cleansing: Rinsed/Irrigated with Saline Topical Anesthetic Applied: Other: lidocaine 4%, Electronic Signature(s) Signed: 07/28/2016 9:16:41 PM By: Elliot GurneyWoody, BSN, RN, CWS, Kim RN, BSN Entered By: Elliot GurneyWoody, BSN, RN, CWS, Kim on 07/28/2016 11:32:09 Agerton, Irma NewnessARTHUR E. ([id number]) -------------------------------------------------------------------------------- Vitals Details Patient Name: Delane GingerFORTUNE, Jung E. Date of Service: 07/28/2016 11:15 AM Medical Record Number: [id number] Patient Account Number: [id number] Date of Birth/Sex: September 21, 1934 (82 y.o. Male) Treating RN: Huel CoventryWoody, Kim Primary Care Hatim Homann: Baruch GoutyLada, Melinda Other Clinician: Referring Kenzie Thoreson: Baruch GoutyLada, Melinda Treating Lyfe Monger/Extender: Altamese CarolinaOBSON, MICHAEL G Weeks in Treatment: 2 Vital Signs Time Taken: 11:22 Temperature (F): 97.5 Height (in): 72 Pulse (bpm): 75 Weight (lbs): 227 Respiratory Rate (breaths/min): 16 Body Mass Index (BMI): 30.8 Blood Pressure (mmHg): 128/77 Reference Range: 80 - 120 mg / dl Electronic Signature(s) Signed: 07/28/2016 9:16:41 PM By: Elliot GurneyWoody, BSN, RN, CWS, Kim RN, BSN Entered By: Elliot GurneyWoody, BSN, RN, CWS, Kim on 07/28/2016 11:22:40

## 2016-08-02 ENCOUNTER — Inpatient Hospital Stay: Payer: Medicare Other | Attending: Oncology | Admitting: Oncology

## 2016-08-02 ENCOUNTER — Ambulatory Visit: Payer: Medicare Other | Admitting: Surgery

## 2016-08-02 NOTE — Progress Notes (Deleted)
Hematology/Oncology Consult note [facility] Telephone:([phone] [phone]  Patient Care Team: Arnetha Courser, MD as PCP - General (Family Medicine) [place], [place] (Podiatry)   Name of the patient: [name]  [id number]  1934-06-17    Reason for referral- abnormal spep   Referring physician- Dr. Sanda Klein  Date of visit: 08/02/16   History of presenting illness- Patient is a 82 year old male with a past medical history significant for diabetes complicated by foot ulcer as well as neuropathy. As a part of the workup patient had SPEP done which showed mildly elevated beta2 microglobulins were no abnormal M protein and has been referred to [place] for the same. Recent CBC from 07/08/2016 showed white count of 8.0, H&H of 13.2/78.3 and a platelet count of 253. BMP showed mildly elevated creatinine of 1.25. Calcium was normal at 9.4. He was also found to have hyperthyroidism and sees endocrinology for the same. Recent TSH was 0.27.  ECOG PS- ***  Pain scale- ***   Review of systems- Review of Systems  Constitutional: Negative for chills, fever, malaise/fatigue and weight loss.  HENT: Negative for congestion, ear discharge and nosebleeds.   Eyes: Negative for blurred vision.  Respiratory: Negative for cough, hemoptysis, sputum production, shortness of breath and wheezing.   Cardiovascular: Negative for chest pain, palpitations, orthopnea and claudication.  Gastrointestinal: Negative for abdominal pain, blood in stool, constipation, diarrhea, heartburn, melena, nausea and vomiting.  Genitourinary: Negative for dysuria, flank pain, frequency, hematuria and urgency.  Musculoskeletal: Negative for back pain, joint pain and myalgias.  Skin: Negative for rash.  Neurological: Negative for dizziness, tingling, focal weakness, seizures, weakness and headaches.  Endo/Heme/Allergies: Does not bruise/bleed easily.  Psychiatric/Behavioral: Negative for depression  and suicidal ideas. The patient does not have insomnia.     Allergies  Allergen Reactions  . Oxycodone-Acetaminophen Other (See Comments)    Causes the pt to be very irritable, says he can take this  . Percocet [Oxycodone-Acetaminophen] Other (See Comments)    Reaction:  Irritability     Patient Active Problem List   Diagnosis Date Noted  . Diabetic foot ulcers ([place]) 07/08/2016  . Congestive heart failure (CHF) ([place]) 06/14/2016  . Medication monitoring encounter 06/14/2016  . Hyperthyroidism 12/04/2015  . Unsteady gait 12/04/2015  . 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 Abnormal serum protein electrophoresis 12/05/2014  . Pressure ulcer 11/15/2014  . Hyperproteinemia 11/02/2014  . Hypoalbuminemia 11/02/2014  . Arthritis 10/23/2014  . Arteriosclerosis of coronary artery 10/23/2014  . Diabetic foot ulcer associated with type 2 diabetes mellitus ([place]) 10/23/2014  . Amputation of finger of left hand 10/23/2014  . History of pulmonary embolism 10/23/2014  . Literacy problem 10/23/2014  . Umbilical hernia without obstruction or gangrene 10/23/2014  . Vitamin B12 deficiency 10/23/2014  . Neuropathy of lower extremity 10/23/2014  . History of osteomyelitis 10/23/2014  . Artificial cardiac pacemaker 08/10/2013  . H/O coronary artery bypass surgery 08/10/2013  . Benign prostatic hyperplasia with urinary obstruction 09/27/2012  . Abnormal prostate specific antigen 09/27/2012     Past Medical History:  Diagnosis Date  . Arthritis   . CAD (coronary artery disease)   . Congestive heart failure (CHF) ([place]) 06/14/2016   LVEF 40-45% 2016 echo; Dr. Saralyn Pilar  . COPD (chronic obstructive pulmonary disease) ([place])   . Diabetes mellitus without complication ([place])   . Family history of adverse reaction to anesthesia  mom seizures   . GERD (gastroesophageal reflux disease)    . History of blood clots in legs   . History of ischemic cardiomyopathy   . 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 Hyperlipidemia   . Hypertension   . Literacy level of illiterate   . Osteomyelitis ([place])   . PN (peripheral neuropathy)      Past Surgical History:  Procedure Laterality Date  . amputation 4th finger Left   . APPENDECTOMY    . CLAVICLE SURGERY  2012   open reduction and internal fixation of left clavicle  . CORONARY ARTERY BYPASS GRAFT  2002  . FOOT SURGERY    . IMPLANTABLE CARDIOVERTER DEFIBRILLATOR (ICD) GENERATOR CHANGE N/A 12/03/2015   Procedure: ICD GENERATOR CHANGE;  Surgeon: Isaias Cowman, MD;  Location: [facility] ORS;  Service: Cardiovascular;  Laterality: N/A;  . PACEMAKER INSERTION      Social History   Social History  . Marital status: Divorced    Spouse name: N/A  . Number of children: N/A  . Years of education: N/A   Occupational History  . Not on file.   Social History Main Topics  . Smoking status: Never Smoker  . Smokeless tobacco: Never Used  . Alcohol use No  . Drug use: No  . Sexual activity: Not on file   Other Topics Concern  . Not on file   Social History Narrative  . No narrative on file     Family History  Problem Relation Age of Onset  . Cancer Mother        spine  . Hypertension Mother   . Mental illness Mother   . Cancer Father        lung  . Asthma Father   . Allergic rhinitis Father   . Arthritis Father      Current Outpatient Prescriptions:  .  acetaminophen (TYLENOL) 500 MG tablet, Take 500 mg by mouth every 8 (eight) hours as needed., Disp: , Rfl:  .  aspirin EC 81 MG tablet, Take 1 tablet (81 mg total) by mouth daily., Disp: 30 tablet, Rfl: 11 .  atorvastatin (LIPITOR) 40 MG tablet, Take 1 tablet (40 mg total) by mouth at bedtime. (Patient not taking: Reported on 06/23/2016), Disp: 90 tablet, Rfl: 1 .  busPIRone (BUSPAR) 5 MG tablet, Take 5 mg by mouth 2 (two) times daily as needed., Disp: , Rfl:  .   cholecalciferol (VITAMIN D) 1000 units tablet, Take 1,000 Units by mouth 2 (two) times daily., Disp: , Rfl:  .  furosemide (LASIX) 20 MG tablet, Take 1 tablet (20 mg total) by mouth daily., Disp: 30 tablet, Rfl: 0 .  gabapentin (NEURONTIN) 300 MG capsule, Take 1 capsule (300 mg total) by mouth 3 (three) times daily., Disp: 90 capsule, Rfl: 5 .  omeprazole (PRILOSEC) 40 MG capsule, Take 1 capsule (40 mg total) by mouth daily., Disp: 30 capsule, Rfl: 1 .  potassium chloride (K-DUR) 10 MEQ tablet, Take 1 tablet (10 mEq total) by mouth daily., Disp: 30 tablet, Rfl: 0 .  Saw Palmetto, Serenoa repens, 1000 MG CAPS, Take 1,200 mg by mouth daily., Disp: , Rfl:  .  vitamin B-12 (CYANOCOBALAMIN) 500 MCG tablet, Take 500 mcg by mouth daily., Disp: , Rfl:    Physical exam: There were no vitals filed for this visit. Physical Exam  Constitutional: He is oriented to person, place, and time and well-developed, well-nourished, and in no distress.  HENT:  Head: Normocephalic and atraumatic.  Eyes: EOM are normal. Pupils are  equal, round, and reactive to light.  Neck: Normal range of motion.  Cardiovascular: Normal rate, regular rhythm and normal heart sounds.   Pulmonary/Chest: Effort normal and breath sounds normal.  Abdominal: Soft. Bowel sounds are normal.  Neurological: He is alert and oriented to person, place, and time.  Skin: Skin is warm and dry.       CMP Latest Ref Rng & Units 07/08/2016  Glucose 65 - 99 mg/dL 97  BUN 7 - 25 mg/dL 12  Creatinine 0.70 - 1.18 mg/dL 1.25(H)  Sodium 135 - 146 mmol/L 138  Potassium 3.5 - 5.3 mmol/L 4.2  Chloride 98 - 110 mmol/L 107  CO2 20 - 31 mmol/L 22  Calcium 8.6 - 10.3 mg/dL 9.4  Total Protein 6.5 - 8.1 g/dL -  Total Bilirubin 0.3 - 1.2 mg/dL -  Alkaline Phos 38 - 126 U/L -  AST 15 - 41 U/L -  ALT 17 - 63 U/L -   CBC Latest Ref Rng & Units 07/08/2016  WBC 3.8 - 10.8 K/uL 8.0  Hemoglobin 13.2 - 17.1 g/dL 13.2  Hematocrit 38.5 - 50.0 % 38.3(L)    Platelets 140 - 400 K/uL 253    No images are attached to the encounter.  Dg Foot Complete Left  Result Date: 07/20/2016 CLINICAL DATA:  Ulcer along plantar aspect of foot. Diabetes mellitus. EXAM: LEFT FOOT - COMPLETE 3+ VIEW COMPARISON:  October 23, 2014 FINDINGS: Frontal, oblique, and lateral views were obtained. The patient has had surgical removal of the distal aspect of the fifth metatarsal, unchanged. On the current examination, there is no demonstrable bony destruction. No fracture or dislocation. There is narrowing of the first MTP joint as well as all PIP and DIP joints. There is spurring in the dorsal midfoot region. There are multiple foci of vascular calcification. There is soft tissue swelling along the volar aspect of the forefoot. No soft tissue air is seen in this area. IMPRESSION: Status post previous surgical removal of the distal aspect of the fifth metatarsal, unchanged. Areas of osteoarthritis at multiple joints noted. Soft tissue swelling noted along the volar aspect of the forefoot without soft tissue air in this area. No erosive change or bony destruction is appreciable. Extensive arterial vascular calcifications likely secondary to diabetes mellitus. If there remains concern for osteomyelitis, either MR of the foot pre and post contrast or nuclear medicine three-phase bone scan could be helpful to further assess. Electronically Signed   By: Lowella Grip III M.D.   On: 07/20/2016 08:06    Assessment and plan- Patient is a 82 y.o. male referred for abnormal spep  On looking at his prior spep, patient did not have evidence of M protein in his blood. Beta 2 microglobulin was mildly elevated, but in the absence of M protein is not suggestive of multiple myeloma. Today I will repeat cbc, free light chains serum, myeloma panel and randon upep and urine IFE. I will see the patient back in 1 weeks time to discuss the results. In the absence of monoclonal protein mildly elevated  beta2 microglobulin does not require monitoring and unrelated to his neuropathy  Thank you for this kind referral and the opportunity to participate in the care of this patient   Visit Diagnosis 1. Abnormal SPEP     Dr. Randa Evens, MD, MPH [facility] at [facility] Pager- [phone] 08/02/2016

## 2016-08-06 ENCOUNTER — Ambulatory Visit: Payer: Medicare Other | Admitting: Physician Assistant

## 2016-08-16 ENCOUNTER — Emergency Department
Admission: EM | Admit: 2016-08-16 | Discharge: 2016-08-16 | Disposition: A | Discharge status: Home / Self Care | Payer: Medicare Other | Attending: Emergency Medicine | Admitting: Emergency Medicine

## 2016-08-16 ENCOUNTER — Encounter: Payer: Self-pay | Admitting: Emergency Medicine

## 2016-08-16 DIAGNOSIS — I10 Essential (primary) hypertension: Secondary | ICD-10-CM | POA: Diagnosis not present

## 2016-08-16 DIAGNOSIS — J449 Chronic obstructive pulmonary disease, unspecified: Secondary | ICD-10-CM | POA: Diagnosis not present

## 2016-08-16 DIAGNOSIS — I2510 Atherosclerotic heart disease of native coronary artery without angina pectoris: Secondary | ICD-10-CM | POA: Diagnosis not present

## 2016-08-16 DIAGNOSIS — E079 Disorder of thyroid, unspecified: Secondary | ICD-10-CM | POA: Diagnosis not present

## 2016-08-16 DIAGNOSIS — E119 Type 2 diabetes mellitus without complications: Secondary | ICD-10-CM | POA: Insufficient documentation

## 2016-08-16 DIAGNOSIS — R531 Weakness: Secondary | ICD-10-CM | POA: Insufficient documentation

## 2016-08-16 DIAGNOSIS — Z7982 Long term (current) use of aspirin: Secondary | ICD-10-CM | POA: Insufficient documentation

## 2016-08-16 DIAGNOSIS — I509 Heart failure, unspecified: Secondary | ICD-10-CM | POA: Diagnosis not present

## 2016-08-16 DIAGNOSIS — Z79899 Other long term (current) drug therapy: Secondary | ICD-10-CM | POA: Insufficient documentation

## 2016-08-16 LAB — URINALYSIS, COMPLETE (UACMP) WITH MICROSCOPIC
Bacteria, UA: NONE SEEN
Bilirubin Urine: NEGATIVE
GLUCOSE, UA: NEGATIVE mg/dL
HGB URINE DIPSTICK: NEGATIVE
KETONES UR: NEGATIVE mg/dL
LEUKOCYTES UA: NEGATIVE
Nitrite: NEGATIVE
PROTEIN: 30 mg/dL — AB
RBC / HPF: NONE SEEN RBC/hpf (ref 0–5)
Specific Gravity, Urine: 1.013 (ref 1.005–1.030)
pH: 6 (ref 5.0–8.0)

## 2016-08-16 LAB — CBC
HCT: 44.4 % (ref 40.0–52.0)
HEMOGLOBIN: 15.2 g/dL (ref 13.0–18.0)
MCH: 32.6 pg (ref 26.0–34.0)
MCHC: 34.3 g/dL (ref 32.0–36.0)
MCV: 94.9 fL (ref 80.0–100.0)
PLATELETS: 185 10*3/uL (ref 150–440)
RBC: 4.68 MIL/uL (ref 4.40–5.90)
RDW: 14.1 % (ref 11.5–14.5)
WBC: 7.1 10*3/uL (ref 3.8–10.6)

## 2016-08-16 LAB — BASIC METABOLIC PANEL
Anion gap: 9 (ref 5–15)
BUN: 12 mg/dL (ref 6–20)
CHLORIDE: 106 mmol/L (ref 101–111)
CO2: 22 mmol/L (ref 22–32)
CREATININE: 1.24 mg/dL (ref 0.61–1.24)
Calcium: 10.1 mg/dL (ref 8.9–10.3)
GFR calc Af Amer: >60 mL/min (ref >60)
GFR calc non Af Amer: 53 mL/min — ABNORMAL LOW (ref >60)
GLUCOSE: 102 mg/dL — AB (ref 65–99)
Potassium: 3.9 mmol/L (ref 3.5–5.1)
Sodium: 137 mmol/L (ref 135–145)

## 2016-08-16 LAB — TROPONIN I: Troponin I: <0.03 ng/mL (ref <0.03)

## 2016-08-16 NOTE — ED Triage Notes (Signed)
Pt to ed via ems from home after home health nurse told him he had a fever.  Per ems RN at home checked pt temp in axillary area.  However, per ems, no fever when checked orally.  Pt does not have a fever at triage at this time.  Pt denies pain, states he came because the nurse told him to.  Pt reports he has been feeling weak and tired, and feels dizzy occasionally with standing.  Pt skin warm and dry, pt alert.

## 2016-08-16 NOTE — Discharge Instructions (Signed)
Please seek medical attention for any high fevers, chest pain, shortness of breath, change in behavior, persistent vomiting, bloody stool or any other new or concerning symptoms.  

## 2016-08-16 NOTE — ED Notes (Addendum)
Aflac IncorporatedCharles Swift, pts grandson, called for pick up. Pt to have bandages changed per MD Derrill KayGoodman

## 2016-08-16 NOTE — ED Notes (Addendum)
Dressings on pts L/R feet changed. Pt sts grandson coming for pick up.

## 2016-08-16 NOTE — ED Notes (Signed)
Given urinal for urine sample.

## 2016-08-16 NOTE — ED Notes (Signed)
Pt home nurse came to dress wounds and sent for fever. No temp with EMS or at ED.  Pt would like to be seen for lower back pain since here from a fall 2 months ago.  Pain worse with movement. No other acute problems noted by pt.

## 2016-08-16 NOTE — ED Provider Notes (Signed)
[facility] Emergency Department Provider Note   ____________________________________________   I have reviewed the triage vital signs and the nursing notes.   HISTORY  Chief Complaint Weakness   History limited by: Not Limited   HPI [name] is a 82 y.o. male who presents to the emergency department today because of concerns for fever noted by home health nurse. However for EMS and triage nurse patient did not have fevers. Patient states that he himself has notany fevers although he has felt increasingly fatigued the past 2 days. This has been fairly constant. He denies any diarrhea. No change in frequency or pain with urination although there has been dark orange in color. Patient does admit to not eating or drinking as much is normal.    Past Medical History:  Diagnosis Date  . Arthritis   . CAD (coronary artery disease)   . Congestive heart failure (CHF) (H[facility]) 06/14/2016   LVEF 40-45% 2016 echo; Dr. Darrold Junker  . COPD (chronic obstructive pulmonary disease) (H[facility])   . Diabetes mellitus without complication ([facility])   . Family history of adverse reaction to anesthesia    mom seizures   . GERD (gastroesophageal reflux disease)   . History of blood clots in legs   . History of ischemic cardiomyopathy   . 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 Hyperlipidemia   . Hypertension   . Literacy level of illiterate   . Osteomyelitis (H[facility])   . PN (peripheral neuropathy)     Patient Active Problem List   Diagnosis Date Noted  . Diabetic foot ulcers (H[facility]) 07/08/2016  . Congestive heart failure (CHF) ([facility]) 06/14/2016  . Medication monitoring encounter 06/14/2016  . Hyperthyroidism 12/04/2015  . Unsteady gait 12/04/2015  . 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 Abnormal serum protein electrophoresis 12/05/2014  . Pressure ulcer 11/15/2014   . Hyperproteinemia 11/02/2014  . Hypoalbuminemia 11/02/2014  . Arthritis 10/23/2014  . Arteriosclerosis of coronary artery 10/23/2014  . Diabetic foot ulcer associated with type 2 diabetes mellitus (HCC) 10/23/2014  . Amputation of finger of left hand 10/23/2014  . History of pulmonary embolism 10/23/2014  . Literacy problem 10/23/2014  . Umbilical hernia without obstruction or gangrene 10/23/2014  . Vitamin B12 deficiency 10/23/2014  . Neuropathy of lower extremity 10/23/2014  . History of osteomyelitis 10/23/2014  . Artificial cardiac pacemaker 08/10/2013  . H/O coronary artery bypass surgery 08/10/2013  . Benign prostatic hyperplasia with urinary obstruction 09/27/2012  . Abnormal prostate specific antigen 09/27/2012    Past Surgical History:  Procedure Laterality Date  . amputation 4th finger Left   . APPENDECTOMY    . CLAVICLE SURGERY  2012   open reduction and internal fixation of left clavicle  . CORONARY ARTERY BYPASS GRAFT  2002  . FOOT SURGERY    . IMPLANTABLE CARDIOVERTER DEFIBRILLATOR (ICD) GENERATOR CHANGE N/A 12/03/2015   Procedure: ICD GENERATOR CHANGE;  Surgeon: Marcina Millard, MD;  Location: [facility] ORS;  Service: Cardiovascular;  Laterality: N/A;  . PACEMAKER INSERTION      Prior to Admission medications   Medication Sig Start Date End Date Taking? Authorizing Provider  acetaminophen (TYLENOL) 500 MG tablet Take 500 mg by mouth every 8 (eight) hours as needed.    [provider]  aspirin EC 81 MG tablet Take 1 tablet (81 mg total) by mouth daily. 05/11/16   Kerman Passey, MD  atorvastatin (LIPITOR) 40 MG tablet Take 1  tablet (40 mg total) by mouth at bedtime. Patient not taking: Reported on 06/23/2016 03/18/16   Kerman Passey, MD  busPIRone (BUSPAR) 5 MG tablet Take 5 mg by mouth 2 (two) times daily as needed.    [provider]  cholecalciferol (VITAMIN D) 1000 units tablet Take 1,000 Units by mouth 2 (two) times daily.    [provider]  furosemide (LASIX) 20 MG tablet Take 1 tablet (20 mg total) by mouth daily. 07/26/16   [name], Janit Bern, MD  gabapentin (NEURONTIN) 300 MG capsule Take 1 capsule (300 mg total) by mouth 3 (three) times daily. 02/02/16   Kerman Passey, MD  omeprazole (PRILOSEC) 40 MG capsule Take 1 capsule (40 mg total) by mouth daily. 12/11/15 12/10/16  Sharman Cheek, MD  potassium chloride (K-DUR) 10 MEQ tablet Take 1 tablet (10 mEq total) by mouth daily. 07/26/16   [name], Janit Bern, MD  Saw Palmetto, Serenoa repens, 1000 MG CAPS Take 1,200 mg by mouth daily.    [provider]  vitamin B-12 (CYANOCOBALAMIN) 500 MCG tablet Take 500 mcg by mouth daily.    [provider]    Allergies Oxycodone-acetaminophen and Percocet [oxycodone-acetaminophen]  Family History  Problem Relation Age of Onset  . Cancer Mother        spine  . Hypertension Mother   . Mental illness Mother   . Cancer Father        lung  . Asthma Father   . Allergic rhinitis Father   . Arthritis Father     Social History Social History  Substance Use Topics  . Smoking status: Never Smoker  . Smokeless tobacco: Never Used  . Alcohol use No    Review of Systems Constitutional: No fever/chills. Positive for fatigue.  Eyes: No visual changes. ENT: No sore throat. Cardiovascular: Denies chest pain. Respiratory: Denies shortness of breath. Gastrointestinal: No abdominal pain.  No nausea, no vomiting.  No diarrhea.   Genitourinary: Negative for dysuria. Musculoskeletal: Negative for back pain. Skin: Negative for rash. Neurological: Negative for headaches, focal weakness or numbness.  ____________________________________________   PHYSICAL EXAM:  VITAL SIGNS: ED Triage Vitals  Enc Vitals Group     BP 08/16/16 1404 135/65     Pulse Rate 08/16/16 1404 64     Resp 08/16/16 1404 16     Temp 08/16/16 1404 98.1 F (36.7 C)     Temp Source 08/16/16 1404 Oral     SpO2 --      Weight 08/16/16  1404 207 lb (93.9 kg)     Height 08/16/16 1404 6' (1.829 m)     Head Circumference --      Peak Flow --      Pain Score 08/16/16 1415 0   Constitutional: Alert and oriented. Well appearing and in no distress. Eyes: Conjunctivae are normal.  ENT   Head: Normocephalic and atraumatic.   Nose: No congestion/rhinnorhea.   Mouth/Throat: Mucous membranes are moist.   Neck: No stridor. Hematological/Lymphatic/Immunilogical: No cervical lymphadenopathy. Cardiovascular: Normal rate, regular rhythm.  No murmurs, rubs, or gallops. Respiratory: Normal respiratory effort without tachypnea nor retractions. Breath sounds are clear and equal bilaterally. No wheezes/rales/rhonchi. Gastrointestinal: Soft and non tender. No rebound. No guarding.  Genitourinary: Deferred Musculoskeletal: Normal range of motion in all extremities. Bilateral feet in protective foot bindings.  Neurologic:  Normal speech and language. No gross focal neurologic deficits are appreciated.  Skin:  Skin is warm, dry and intact. No rash noted. Psychiatric: Mood  and affect are normal. Speech and behavior are normal. Patient exhibits appropriate insight and judgment.  ____________________________________________    LABS (pertinent positives/negatives)  Labs Reviewed  BASIC METABOLIC PANEL - Abnormal; Notable for the following:       Result Value   Glucose, Bld 102 (*)    GFR calc non Af Amer 53 (*)    All other components within normal limits  URINALYSIS, COMPLETE (UACMP) WITH MICROSCOPIC - Abnormal; Notable for the following:    Color, Urine YELLOW (*)    APPearance CLEAR (*)    Protein, ur 30 (*)    Squamous Epithelial / LPF 0-5 (*)    All other components within normal limits  CBC  TROPONIN I     ____________________________________________   EKG  I, Phineas SemenGraydon Tiyonna Sardinha, attending physician, personally viewed and interpreted this EKG  EKG Time: 1440 Rate: 64 Rhythm: av dual paced rhythm Axis:  normal Intervals: qtc 509 QRS: paced complex ST changes: no st elevation Impression: abnormal ekg   ____________________________________________    RADIOLOGY  None   ____________________________________________   PROCEDURES  Procedures  ____________________________________________   INITIAL IMPRESSION / ASSESSMENT AND PLAN / ED COURSE  Pertinent labs & imaging results that were available during my care of the patient were reviewed by me and considered in my medical decision making (see chart for details).  Patient presents to the emergency department today because of concerns for possible weakness and fever. Patient was afebrile here. Workup without any concerning findings. No signs of significant infection. Troponin was negative. At this point think it is reasonable for patient to be discharged home. Did discuss return precautions cautions. Did encourage primary care follow-up.  ____________________________________________   FINAL CLINICAL IMPRESSION(S) / ED DIAGNOSES  Final diagnoses:  Weakness     Note: This dictation was prepared with Dragon dictation. Any transcriptional errors that result from this process are unintentional     Phineas SemenGoodman, Toriann Spadoni, MD 08/16/16 2137

## 2016-08-26 ENCOUNTER — Encounter: Payer: Self-pay | Admitting: Emergency Medicine

## 2016-08-26 ENCOUNTER — Emergency Department: Payer: Medicare Other

## 2016-08-26 ENCOUNTER — Emergency Department
Admission: EM | Admit: 2016-08-26 | Discharge: 2016-08-28 | Disposition: A | Discharge status: Home / Self Care | Payer: Medicare Other | Attending: Emergency Medicine | Admitting: Emergency Medicine

## 2016-08-26 DIAGNOSIS — Z79899 Other long term (current) drug therapy: Secondary | ICD-10-CM | POA: Diagnosis not present

## 2016-08-26 DIAGNOSIS — I509 Heart failure, unspecified: Secondary | ICD-10-CM | POA: Insufficient documentation

## 2016-08-26 DIAGNOSIS — I10 Essential (primary) hypertension: Secondary | ICD-10-CM | POA: Insufficient documentation

## 2016-08-26 DIAGNOSIS — E079 Disorder of thyroid, unspecified: Secondary | ICD-10-CM | POA: Insufficient documentation

## 2016-08-26 DIAGNOSIS — J449 Chronic obstructive pulmonary disease, unspecified: Secondary | ICD-10-CM | POA: Insufficient documentation

## 2016-08-26 DIAGNOSIS — E119 Type 2 diabetes mellitus without complications: Secondary | ICD-10-CM | POA: Insufficient documentation

## 2016-08-26 DIAGNOSIS — Z7982 Long term (current) use of aspirin: Secondary | ICD-10-CM | POA: Diagnosis not present

## 2016-08-26 DIAGNOSIS — R531 Weakness: Secondary | ICD-10-CM

## 2016-08-26 DIAGNOSIS — I2510 Atherosclerotic heart disease of native coronary artery without angina pectoris: Secondary | ICD-10-CM | POA: Diagnosis not present

## 2016-08-26 DIAGNOSIS — M6281 Muscle weakness (generalized): Secondary | ICD-10-CM | POA: Diagnosis not present

## 2016-08-26 DIAGNOSIS — Z951 Presence of aortocoronary bypass graft: Secondary | ICD-10-CM | POA: Diagnosis not present

## 2016-08-26 DIAGNOSIS — Z950 Presence of cardiac pacemaker: Secondary | ICD-10-CM | POA: Diagnosis not present

## 2016-08-26 LAB — CBC
HCT: 43 % (ref 40.0–52.0)
HEMOGLOBIN: 14.6 g/dL (ref 13.0–18.0)
MCH: 31.7 pg (ref 26.0–34.0)
MCHC: 33.9 g/dL (ref 32.0–36.0)
MCV: 93.4 fL (ref 80.0–100.0)
PLATELETS: 221 10*3/uL (ref 150–440)
RBC: 4.6 MIL/uL (ref 4.40–5.90)
RDW: 13.9 % (ref 11.5–14.5)
WBC: 6.6 10*3/uL (ref 3.8–10.6)

## 2016-08-26 LAB — BASIC METABOLIC PANEL
Anion gap: 10 (ref 5–15)
BUN: 14 mg/dL (ref 6–20)
CHLORIDE: 106 mmol/L (ref 101–111)
CO2: 22 mmol/L (ref 22–32)
CREATININE: 1.16 mg/dL (ref 0.61–1.24)
Calcium: 10.3 mg/dL (ref 8.9–10.3)
GFR calc non Af Amer: 58 mL/min — ABNORMAL LOW (ref >60)
Glucose, Bld: 106 mg/dL — ABNORMAL HIGH (ref 65–99)
Potassium: 4.1 mmol/L (ref 3.5–5.1)
Sodium: 138 mmol/L (ref 135–145)

## 2016-08-26 LAB — URINALYSIS, COMPLETE (UACMP) WITH MICROSCOPIC
BILIRUBIN URINE: NEGATIVE
Bacteria, UA: NONE SEEN
Glucose, UA: NEGATIVE mg/dL
Hgb urine dipstick: NEGATIVE
KETONES UR: NEGATIVE mg/dL
Leukocytes, UA: NEGATIVE
Nitrite: NEGATIVE
PROTEIN: NEGATIVE mg/dL
RBC / HPF: NONE SEEN RBC/hpf (ref 0–5)
SQUAMOUS EPITHELIAL / LPF: NONE SEEN
Specific Gravity, Urine: 1.01 (ref 1.005–1.030)
WBC UA: NONE SEEN WBC/hpf (ref 0–5)
pH: 5 (ref 5.0–8.0)

## 2016-08-26 LAB — TROPONIN I

## 2016-08-26 MED ORDER — POTASSIUM CHLORIDE ER 10 MEQ PO TBCR
10.0000 meq | EXTENDED_RELEASE_TABLET | Freq: Every day | ORAL | Status: DC
  Administered 2016-08-27 – 2016-08-28 (×2): 10 meq via ORAL
  Filled 2016-08-26 (×3): qty 1

## 2016-08-26 MED ORDER — SODIUM CHLORIDE 0.9 % IV BOLUS (SEPSIS)
250.0000 mL | Freq: Once | INTRAVENOUS | Status: AC
  Administered 2016-08-26: 250 mL via INTRAVENOUS

## 2016-08-26 MED ORDER — ONDANSETRON HCL 4 MG/2ML IJ SOLN
4.0000 mg | Freq: Once | INTRAMUSCULAR | Status: AC
  Administered 2016-08-26: 4 mg via INTRAVENOUS
  Filled 2016-08-26: qty 2

## 2016-08-26 MED ORDER — BUSPIRONE HCL 5 MG PO TABS: 5.0000 mg | ORAL_TABLET | Freq: Two times a day (BID) | ORAL | Status: DC | PRN

## 2016-08-26 MED ORDER — FUROSEMIDE 40 MG PO TABS
20.0000 mg | ORAL_TABLET | Freq: Every day | ORAL | Status: DC
Start: 2016-08-26 — End: 2016-08-29
  Administered 2016-08-27 – 2016-08-28 (×2): 20 mg via ORAL
  Filled 2016-08-26 (×3): qty 1

## 2016-08-26 MED ORDER — PANTOPRAZOLE SODIUM 40 MG PO TBEC
40.0000 mg | DELAYED_RELEASE_TABLET | Freq: Every day | ORAL | Status: DC
  Administered 2016-08-27 – 2016-08-28 (×2): 40 mg via ORAL
  Filled 2016-08-26 (×3): qty 1

## 2016-08-26 MED ORDER — GABAPENTIN 600 MG PO TABS
ORAL_TABLET | ORAL | Status: AC
  Filled 2016-08-26: qty 1

## 2016-08-26 MED ORDER — GABAPENTIN 300 MG PO CAPS
300.0000 mg | ORAL_CAPSULE | Freq: Three times a day (TID) | ORAL | Status: DC
  Administered 2016-08-27 – 2016-08-28 (×6): 300 mg via ORAL
  Filled 2016-08-26 (×9): qty 1

## 2016-08-26 MED ORDER — ATORVASTATIN CALCIUM 20 MG PO TABS
40.0000 mg | ORAL_TABLET | Freq: Every day | ORAL | Status: DC
  Administered 2016-08-27 – 2016-08-28 (×2): 40 mg via ORAL
  Filled 2016-08-26 (×3): qty 2

## 2016-08-26 NOTE — ED Notes (Signed)
Pt given sandwich tray, cranberry juice, water and vanilla ice cream upon request.

## 2016-08-26 NOTE — ED Notes (Signed)
Patient transported to X-ray 
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
## 2016-08-26 NOTE — Progress Notes (Signed)
SNF referrals have been made. CSW continues to follow for disposition    Enos FlingAshley Anis Degidio, MSW, LCSW [facility] Clinical Social Worker [phone]

## 2016-08-26 NOTE — [place] FL2 (Signed)
[place] MEDICAID FL2 LEVEL OF CARE SCREENING TOOL     IDENTIFICATION  Patient Name: [name] Birthdate: 1934-06-23 Sex: male Admission Date (Current Location): 08/26/2016  [place]   Facility and Address:  [facility], [address], [place], [place] [address]      Provider Number: [id number]  Attending Physician Name and Address:  Nita SickleVeronese, [place], MD  Relative Name and Phone Number:  Jimmie MollyCharles Buhman [facility]([place]) [phone]    Current Level of Care: Hospital Recommended Level of Care: Skilled Nursing Facility Prior Approval Number:    Date Approved/Denied:   PASRR Number: [id number]  Discharge Plan: SNF    Current Diagnoses: Patient Active Problem List   Diagnosis Date Noted  . Diabetic foot ulcers (HCC) 07/08/2016  . Congestive heart failure (CHF) (HCC) 06/14/2016  . Medication monitoring encounter 06/14/2016  . Hyperthyroidism 12/04/2015  . Unsteady gait 12/04/2015  . 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 Abnormal serum protein electrophoresis 12/05/2014  . Pressure ulcer 11/15/2014  . Hyperproteinemia 11/02/2014  . Hypoalbuminemia 11/02/2014  . Arthritis 10/23/2014  . Arteriosclerosis of coronary artery 10/23/2014  . Diabetic foot ulcer associated with type 2 diabetes mellitus (HCC) 10/23/2014  . Amputation of finger of left hand 10/23/2014  . History of pulmonary embolism 10/23/2014  . Literacy problem 10/23/2014  . Umbilical hernia without obstruction or gangrene 10/23/2014  . Vitamin B12 deficiency 10/23/2014  . Neuropathy of lower extremity 10/23/2014  . History of osteomyelitis 10/23/2014  . Artificial cardiac pacemaker 08/10/2013  . H/O coronary artery bypass surgery 08/10/2013  . Benign prostatic hyperplasia with urinary obstruction 09/27/2012  . Abnormal prostate  specific antigen 09/27/2012    Orientation RESPIRATION BLADDER Height & Weight     Self, Time, Situation, Place  Normal Continent Weight: 207 lb (93.9 kg) Height:  6' (182.9 cm)  BEHAVIORAL SYMPTOMS/MOOD NEUROLOGICAL BOWEL NUTRITION STATUS   (NONE)   Continent Diet (Regular)  AMBULATORY STATUS COMMUNICATION OF NEEDS Skin   Limited Assist Verbally Normal                       Personal Care Assistance Level of Assistance  Bathing, Dressing, Feeding Bathing Assistance: Limited assistance Feeding assistance: Independent Dressing Assistance: Limited assistance     Functional Limitations Info  Sight, Hearing, Speech Sight Info: Adequate Hearing Info: Adequate Speech Info: Adequate    SPECIAL CARE FACTORS FREQUENCY  PT (By licensed PT)                    Contractures Contractures Info: Not present    Additional Factors Info  Code Status, Allergies, Psychotropic Code Status Info: FULL Allergies Info: Oxycodone-acetaminophen, Percocet Oxycodone-acetaminophen Psychotropic Info: Buspar         Current Medications (08/26/2016):  This is the current hospital active medication list No current facility-administered medications for this encounter.    Current Outpatient Prescriptions  Medication Sig Dispense Refill  . acetaminophen (TYLENOL) 500 MG tablet Take 500 mg by mouth every 8 (eight) hours as needed.    . Acidophilus Lactobacillus CAPS Take 1 capsule by mouth 2 (two) times daily.    [name]. aspirin EC 81 MG tablet Take 1 tablet (81 mg total) by mouth daily. 30 tablet 11  . atorvastatin (LIPITOR) 40 MG tablet Take 1 tablet (40 mg total) by mouth at bedtime. 90 tablet 1  . busPIRone (BUSPAR)  5 MG tablet Take 5 mg by mouth 2 (two) times daily as needed.    . cholecalciferol (VITAMIN D) 1000 units tablet Take 1,000 Units by mouth 2 (two) times daily.    . furosemide (LASIX) 20 MG tablet Take 1 tablet (20 mg total) by mouth daily. 30 tablet 0  . gabapentin (NEURONTIN) 300  MG capsule Take 1 capsule (300 mg total) by mouth 3 (three) times daily. 90 capsule 5  . ibuprofen (ADVIL,MOTRIN) 200 MG tablet Take 200 mg by mouth every 6 (six) hours as needed.    [name] omeprazole (PRILOSEC) 40 MG capsule Take 1 capsule (40 mg total) by mouth daily. 30 capsule 1  . potassium chloride (K-DUR) 10 MEQ tablet Take 1 tablet (10 mEq total) by mouth daily. 30 tablet 0  . Saw Palmetto, Serenoa repens, 1000 MG CAPS Take 1,200 mg by mouth daily.    . vitamin B-12 (CYANOCOBALAMIN) 500 MCG tablet Take 500 mcg by mouth daily.       Discharge Medications: Please see discharge summary for a list of discharge medications.  Relevant Imaging Results:  Relevant Lab Results:   Additional Information SSN [id number]  Lew Dawes, LCSW

## 2016-08-26 NOTE — ED Notes (Signed)
ED Provider at bedside. 
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
## 2016-08-27 ENCOUNTER — Telehealth: Payer: Self-pay

## 2016-08-27 LAB — GLUCOSE, CAPILLARY: Glucose-Capillary: 109 mg/dL — ABNORMAL HIGH (ref 65–99)

## 2016-08-27 MED ORDER — ACETAMINOPHEN 325 MG PO TABS
650.0000 mg | ORAL_TABLET | Freq: Once | ORAL | Status: AC
  Administered 2016-08-27: 650 mg via ORAL

## 2016-08-27 MED ORDER — ACETAMINOPHEN 325 MG PO TABS
ORAL_TABLET | ORAL | Status: AC
  Administered 2016-08-27: 650 mg via ORAL
  Filled 2016-08-27: qty 2

## 2016-08-27 NOTE — Telephone Encounter (Signed)
FYI [facility] called states patient has been admitted to the hospital

## 2016-08-27 NOTE — ED Notes (Signed)
IV flushed with normal saline and saline locked.

## 2016-08-27 NOTE — ED Notes (Signed)
Pt reports he takes gabapentin for leg pain. Gabapentin is here from pharmacy, will follow up with pt.

## 2016-08-27 NOTE — ED Notes (Signed)
Patient given a ginger ale, graham crackers and ice cream per request. Patient is alert, watching TV. NAD.

## 2016-08-27 NOTE — ED Notes (Signed)
PT at bedside.

## 2016-08-27 NOTE — ED Notes (Signed)
Patient ate 100% of dinner tray.
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
## 2016-08-27 NOTE — ED Notes (Signed)
Meal and coffee given. Pt eating biscuit and stated it was very good. Pt denies any other needs at this time.

## 2016-08-28 NOTE — ED Notes (Signed)
Pt visualized in NAD, chaplain at bedside with patient at this time, pt is noted to no longer be tearful. Call bell and TV remote are within patient reach. Will continue to monitor for further patient needs.
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
## 2016-08-28 NOTE — ED Notes (Signed)
Assisted patient to toilet.  Told to pull cord on wall when done.

## 2016-08-28 NOTE — ED Notes (Signed)
Report given to Stephen, RN 

## 2016-08-28 NOTE — ED Notes (Signed)
Chaplain at bedside

## 2016-08-28 NOTE — ED Notes (Signed)
This RN to bedside, pt denies any needs at this time. Pt asked, "am I going to get any of my medicines today, my feet are starting to burn." This RN apologized for delay in getting patient his medications, explained had just received a new patient and was getting them settled. Pt states understanding, pt is otherwise visualized in NAD. Will continue to monitor for further patient needs.

## 2016-08-28 NOTE — ED Notes (Signed)
Pt offered bed bath/shower but declined at this time. Pt stated "I will let you know when I am ready." Pt in bed tearful while talking with this tech. Pt asked for wash cloth to wash his face, wet warm wash cloth given. Pt also given breakfast tray, small cup of cranberry juice and large plastic cup of water. Pt encouraged to use call bell if he is in need of anything or when he is ready for shower.

## 2016-08-28 NOTE — Clinical Social Work Note (Signed)
Pt is ready for dishcarge. PT is not recommending follow up. Pt shared that he would like to return home. Pt stated that his grandson and him have issues and do not get along, however he would like to return home. Pt stated that he feels safe to return home. Pt shared that he did have home health, but when CSW called to confirm, home health as closed his case. CSW spoke with EDP and RNCM, about starting services again. CSW would recommend that a home health social work be added to the list.   CSW spoke with [place] [facility], and initial report was screened out. CSW made another referral and asked for a reach out visit. On-call Child psychotherapistsocial worker took the report and will staff it with her supervisor.   CSW attempted to reach pt's grandson to pick pt up. CSW made multiple attempts to reach pt's grandson but CSW left a message requesting a return phone call. Per pt, pt's grandson has the apartment key. CSW updated EDP, RN, and RNCM who will assist in reaching pt's grandson for discharge.   Dede QuerySarah Kassidy Frankson, MSW, LCSW  Clinical Social Worker [phone]

## 2016-08-28 NOTE — ED Notes (Signed)
Patient given meal tray and drink and sitting help by Gerilyn PilgrimJacob, EDT.

## 2016-08-28 NOTE — Progress Notes (Signed)
[facility]. Provided pastoral care.     08/28/16 0920  Clinical Encounter Type  Visited With Patient  Visit Type Initial;Spiritual support  Referral From Chaplain  Consult/Referral To Chaplain  Spiritual Encounters  Spiritual Needs Other (Comment)

## 2016-08-28 NOTE — ED Notes (Signed)
Pt sleeping soundly, even and unlabored respirations, will continue to monitor  

## 2016-08-28 NOTE — ED Notes (Signed)
This RN to bedside at this time. Pt is noted to be resting in bed, NAD noted at this time. While this RN at bedside pt is noted to become tearful stating, "I've tried to be good all my life, my kids don't call, I've been a good person, why don't they love me?" This RN offered to call the chaplain for the patient, pt is agreeable to having the chaplain come to see him. Pt is in no obvious distress, VSS and WNL at this time. This RN explained to patient that his breakfast tray would be brought to him. Pt states understanding.

## 2016-08-28 NOTE — ED Notes (Signed)
Pt visualized in NAD. Pt resting in bed watching TV, call bell within reach. Pt denies any needs at this time. Will continue to monitor for further patient needs.

## 2016-08-28 NOTE — ED Notes (Signed)
Removed old socks from patients feet. Washed patients feet per patient request.  Patient has 2 calluses/sores to right foot/ball of foot. Patient has one callus/sore to left foot/ball of foot at base of great toe.  Dried bilateral feet.

## 2016-08-28 NOTE — ED Notes (Signed)
Reviewed d/c instructions, follow-up care with patient. Pt verbalized understanding.  

## 2016-08-29 NOTE — Progress Notes (Signed)
[name] received a call to the ED department #9. Patient needed prayer an emotional support. Nurse felt patient need grief and spiritual Guidance.  Patient felt he has been there  for everyone and now that he needs people No one is there for him. [name] prayed with the Patient and talked with the patient for over an hour. [name] then went and got her relief [name] and Brought her to meet with him  So he could be reassured That he would have someone to call after I left.

## 2016-08-29 NOTE — Care Management Note (Signed)
Case Management Note  Patient Details  Name: [name] MRN: [id number] Date of Birth: Jan 10, 1935  Subjective/Objective:       Follow up call for home health services.             Action/Plan:Spoke with patient is was not able to tell me which agency he currently uses or provide phone number. He was very agitiated he informed me they come to his house on Monday.Wednesday and Friday's. I asked if if he had his PCP Baruch GoutyMelinda [name] phone number he said yes. I instructed if he desires services added he can call her or call us with the information and services will be added. He did not want to try another agency.    Expected Discharge Date:                  Expected Discharge Plan:     In-House Referral:     Discharge planning Services     Post Acute Care Choice:    Choice offered to:     DME Arranged:    DME Agency:     HH Arranged:    HH Agency:     Status of Service:     If discussed at [employer] Meetings, dates discussed:    Additional Comments:  Caren MacadamMichelle Makayli Bracken, RN 08/29/2016, 6:15 PM

## 2016-08-30 ENCOUNTER — Telehealth: Payer: Self-pay | Admitting: Family Medicine

## 2016-08-30 ENCOUNTER — Inpatient Hospital Stay: Payer: Medicare Other | Attending: Oncology | Admitting: Oncology

## 2016-08-30 NOTE — Telephone Encounter (Signed)
Thank you, please let him know

## 2016-08-30 NOTE — Telephone Encounter (Signed)
ROBIN JUST WANTED YOU TO KNOW THAT THE PATIENT HAS CANCELLED THE LAST FOUR APPTS AND THAT [facility]. WANTS THE PATIENT  TO BE NOTIFIED BEFORE THEY WILL SCHEDULE HIM AGAIN ON THE IMPORTANCE OF KEEPING HIS APPTS.

## 2016-08-30 NOTE — Telephone Encounter (Signed)
[name] the [facility]

## 2016-08-31 NOTE — Telephone Encounter (Signed)
Left detailed voicemail

## 2016-09-02 ENCOUNTER — Other Ambulatory Visit: Payer: Self-pay | Admitting: Family Medicine

## 2016-09-06 ENCOUNTER — Ambulatory Visit: Payer: Self-pay | Admitting: Family Medicine

## 2016-09-07 ENCOUNTER — Encounter: Payer: Self-pay | Admitting: Emergency Medicine

## 2016-09-07 ENCOUNTER — Emergency Department
Admission: EM | Admit: 2016-09-07 | Discharge: 2016-09-07 | Disposition: A | Discharge status: Home / Self Care | Payer: Medicare Other | Attending: Emergency Medicine | Admitting: Emergency Medicine

## 2016-09-07 DIAGNOSIS — I509 Heart failure, unspecified: Secondary | ICD-10-CM | POA: Insufficient documentation

## 2016-09-07 DIAGNOSIS — I110 Hypertensive heart disease with heart failure: Secondary | ICD-10-CM | POA: Insufficient documentation

## 2016-09-07 DIAGNOSIS — R55 Syncope and collapse: Secondary | ICD-10-CM | POA: Diagnosis present

## 2016-09-07 DIAGNOSIS — E1140 Type 2 diabetes mellitus with diabetic neuropathy, unspecified: Secondary | ICD-10-CM | POA: Insufficient documentation

## 2016-09-07 DIAGNOSIS — Z79899 Other long term (current) drug therapy: Secondary | ICD-10-CM | POA: Diagnosis not present

## 2016-09-07 DIAGNOSIS — Z7982 Long term (current) use of aspirin: Secondary | ICD-10-CM | POA: Insufficient documentation

## 2016-09-07 DIAGNOSIS — Z95810 Presence of automatic (implantable) cardiac defibrillator: Secondary | ICD-10-CM | POA: Insufficient documentation

## 2016-09-07 DIAGNOSIS — Z951 Presence of aortocoronary bypass graft: Secondary | ICD-10-CM | POA: Diagnosis not present

## 2016-09-07 LAB — BASIC METABOLIC PANEL
Anion gap: 7 (ref 5–15)
BUN: 15 mg/dL (ref 6–20)
CALCIUM: 9.8 mg/dL (ref 8.9–10.3)
CHLORIDE: 107 mmol/L (ref 101–111)
CO2: 22 mmol/L (ref 22–32)
CREATININE: 1.58 mg/dL — AB (ref 0.61–1.24)
GFR calc Af Amer: 46 mL/min — ABNORMAL LOW (ref >60)
GFR calc non Af Amer: 40 mL/min — ABNORMAL LOW (ref >60)
GLUCOSE: 240 mg/dL — AB (ref 65–99)
Potassium: 3.6 mmol/L (ref 3.5–5.1)
Sodium: 136 mmol/L (ref 135–145)

## 2016-09-07 LAB — CBC
HCT: 41 % (ref 40.0–52.0)
HEMOGLOBIN: 14.1 g/dL (ref 13.0–18.0)
MCH: 32.5 pg (ref 26.0–34.0)
MCHC: 34.5 g/dL (ref 32.0–36.0)
MCV: 94.2 fL (ref 80.0–100.0)
PLATELETS: 187 10*3/uL (ref 150–440)
RBC: 4.36 MIL/uL — ABNORMAL LOW (ref 4.40–5.90)
RDW: 13.8 % (ref 11.5–14.5)
WBC: 7.4 10*3/uL (ref 3.8–10.6)

## 2016-09-07 MED ORDER — SODIUM CHLORIDE 0.9 % IV SOLN
1000.0000 mL | Freq: Once | INTRAVENOUS | Status: AC
  Administered 2016-09-07: 1000 mL via INTRAVENOUS

## 2016-09-07 NOTE — ED Notes (Signed)
Pt is calling family for discharge at this time

## 2016-09-07 NOTE — ED Triage Notes (Signed)
Pt to ED via EMS from home , states he had an unwitnessed syncopal episode, denies any head injury. Pt A&Ox4, VS stable.

## 2016-09-07 NOTE — ED Notes (Signed)
Emergency contact called at this time for d/c ride. No answer at this time, this RN left voice mail

## 2016-09-07 NOTE — ED Provider Notes (Signed)
[facility] Emergency Department Provider Note   ____________________________________________    I have reviewed the triage vital signs and the nursing notes.   HISTORY  Chief Complaint Near Syncope     HPI [name] is a 82 y.o. male Who presents after a near syncopal episode. Patient reports he  Stood up from bed to go to the bathroom and felt quite lightheaded and had to sit on the floor. He denies injury. No chest pain or palpitations. No abdominal pain, nausea or vomiting. He feels well currently and has no dizziness   Past Medical History:  Diagnosis Date  . Arthritis   . CAD (coronary artery disease)   . Congestive heart failure (CHF) (HCC) 06/14/2016   LVEF 40-45% 2016 echo; Dr. Darrold Junker  . COPD (chronic obstructive pulmonary disease) (HCC)   . Diabetes mellitus without complication (HCC)   . Family history of adverse reaction to anesthesia    mom seizures   . GERD (gastroesophageal reflux disease)   . History of blood clots in legs   . History of ischemic cardiomyopathy   . 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 Hyperlipidemia   . Hypertension   . Literacy level of illiterate   . Osteomyelitis (HCC)   . PN (peripheral neuropathy)     Patient Active Problem List   Diagnosis Date Noted  . Diabetic foot ulcers (HCC) 07/08/2016  . Congestive heart failure (CHF) (HCC) 06/14/2016  . Medication monitoring encounter 06/14/2016  . Hyperthyroidism 12/04/2015  . Unsteady gait 12/04/2015  . 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 Abnormal serum protein electrophoresis 12/05/2014  . Pressure ulcer 11/15/2014  . Hyperproteinemia 11/02/2014  . Hypoalbuminemia 11/02/2014  . Arthritis 10/23/2014  . Arteriosclerosis of coronary artery 10/23/2014  . Diabetic foot ulcer associated with type 2 diabetes mellitus (HCC)  10/23/2014  . Amputation of finger of left hand 10/23/2014  . History of pulmonary embolism 10/23/2014  . Literacy problem 10/23/2014  . Umbilical hernia without obstruction or gangrene 10/23/2014  . Vitamin B12 deficiency 10/23/2014  . Neuropathy of lower extremity 10/23/2014  . History of osteomyelitis 10/23/2014  . Artificial cardiac pacemaker 08/10/2013  . H/O coronary artery bypass surgery 08/10/2013  . Benign prostatic hyperplasia with urinary obstruction 09/27/2012  . Abnormal prostate specific antigen 09/27/2012    Past Surgical History:  Procedure Laterality Date  . amputation 4th finger Left   . APPENDECTOMY    . CLAVICLE SURGERY  2012   open reduction and internal fixation of left clavicle  . CORONARY ARTERY BYPASS GRAFT  2002  . FOOT SURGERY    . IMPLANTABLE CARDIOVERTER DEFIBRILLATOR (ICD) GENERATOR CHANGE N/A 12/03/2015   Procedure: ICD GENERATOR CHANGE;  Surgeon: Marcina Millard, MD;  Location: [facility] ORS;  Service: Cardiovascular;  Laterality: N/A;  . PACEMAKER INSERTION      Prior to Admission medications   Medication Sig Start Date End Date Taking? Authorizing Provider  acetaminophen (TYLENOL) 500 MG tablet Take 500 mg by mouth every 8 (eight) hours as needed.    [provider]  Acidophilus Lactobacillus CAPS Take 1 capsule by mouth 2 (two) times daily.    [provider]  aspirin EC 81 MG tablet Take 1 tablet (81 mg total) by mouth daily. 05/11/16   Kerman Passey, MD  atorvastatin (LIPITOR) 40 MG tablet Take 1 tablet (40 mg total) by mouth at bedtime. 03/18/16   Baruch Gouty  P, MD  busPIRone (BUSPAR) 5 MG tablet Take 5 mg by mouth 2 (two) times daily as needed.    [provider]  cholecalciferol (VITAMIN D) 1000 units tablet Take 1,000 Units by mouth 2 (two) times daily.    [provider]  furosemide (LASIX) 20 MG tablet Take 1 tablet (20 mg total) by mouth daily. 09/02/16   Kerman PasseyLada, Melinda P, MD  gabapentin (NEURONTIN) 300  MG capsule Take 1 capsule (300 mg total) by mouth 3 (three) times daily. 02/02/16   Kerman PasseyLada, Melinda P, MD  ibuprofen (ADVIL,MOTRIN) 200 MG tablet Take 200 mg by mouth every 6 (six) hours as needed.    [provider]  omeprazole (PRILOSEC) 40 MG capsule Take 1 capsule (40 mg total) by mouth daily. 12/11/15 12/10/16  Sharman CheekStafford, Phillip, MD  potassium chloride (K-DUR) 10 MEQ tablet Take 1 tablet (10 mEq total) by mouth daily. 09/02/16   [name], [name], MD  Saw Palmetto, Serenoa repens, 1000 MG CAPS Take 1,200 mg by mouth daily.    [provider]  vitamin B-12 (CYANOCOBALAMIN) 500 MCG tablet Take 500 mcg by mouth daily.    [provider]     Allergies Oxycodone-acetaminophen and Percocet [oxycodone-acetaminophen]  Family History  Problem Relation Age of Onset  . Cancer Mother        spine  . Hypertension Mother   . Mental illness Mother   . Cancer Father        lung  . Asthma Father   . Allergic rhinitis Father   . Arthritis Father     Social History Social History  Substance Use Topics  . Smoking status: Never Smoker  . Smokeless tobacco: Never Used  . Alcohol use No    Review of Systems  Constitutional: No fever/chills Eyes: No visual changes.  ENT: No sore throat. Cardiovascular: Denies chest pain. Respiratory: Denies shortness of breath. Gastrointestinal: No abdominal pain.  No nausea, no vomiting.   Genitourinary: Negative for dysuria. Musculoskeletal: Negative for back pain. Skin: Negative for rash. Neurological: Negative for headaches or focal weakness   ____________________________________________   PHYSICAL EXAM:  VITAL SIGNS: ED Triage Vitals  Enc Vitals Group     BP 09/07/16 1400 109/63     Pulse Rate 09/07/16 1400 69     Resp 09/07/16 1400 16     Temp 09/07/16 1400 97.6 F (36.4 C)     Temp Source 09/07/16 1400 Oral     SpO2 09/07/16 1400 98 %     Weight 09/07/16 1357 90.7 kg (200 lb)     Height 09/07/16 1357 1.829 m  (6')     Head Circumference --      Peak Flow --      Pain Score 09/07/16 1355 0     Pain Loc --      Pain Edu? --      Excl. in GC? --     Constitutional: Alert and oriented. No acute distress. Pleasant and interactive Eyes: Conjunctivae are normal.  Head: Atraumatic Nose: No congestion/rhinnorhea. Mouth/Throat: Mucous membranes are moist.   Neck:  Painless ROM Cardiovascular: Normal rate, regular rhythm. Grossly normal heart sounds.  Good peripheral circulation. Respiratory: Normal respiratory effort.  No retractions. Lungs CTAB. Gastrointestinal: Soft and nontender. No distention.  No CVA tenderness. Genitourinary: deferred Musculoskeletal: No lower extremity tenderness nor edema.  Warm and well perfused. Full range of motion of all extremities without pain Neurologic:  Normal speech and language. No gross focal neurologic deficits  are appreciated.  Skin:  Skin is warm, dry and intact. No rash noted. Psychiatric: Mood and affect are normal. Speech and behavior are normal.  ____________________________________________   LABS (all labs ordered are listed, but only abnormal results are displayed)  Labs Reviewed  BASIC METABOLIC PANEL - Abnormal; Notable for the following:       Result Value   Glucose, Bld 240 (*)    Creatinine, Ser 1.58 (*)    GFR calc non Af Amer 40 (*)    GFR calc Af Amer 46 (*)    All other components within normal limits  CBC - Abnormal; Notable for the following:    RBC 4.36 (*)    All other components within normal limits   ____________________________________________  EKG  ED ECG REPORT I, Jene Every, the attending physician, personally viewed and interpreted this ECG.  Date: 09/07/2016  Rate: 60 Rhythm: AV dual paced QRS Axis: normal Intervals: normal ST/T Wave abnormalities: normal Narrative Interpretation:  unremarkable  ____________________________________________  RADIOLOGY  None ____________________________________________   PROCEDURES  Procedure(s) performed: No    Critical Care performed:No ____________________________________________   INITIAL IMPRESSION / ASSESSMENT AND PLAN / ED COURSE  Pertinent labs & imaging results that were available during my care of the patient were reviewed by me and considered in my medical decision making (see chart for details).  Patient well-appearing and in no acute distress. No evidence of injury on exam. EKG and labs are unremarkable.  We will give the patient a liter of IV fluids , check orthostatics and if unremarkable I think he is safe for discharge    ____________________________________________   FINAL CLINICAL IMPRESSION(S) / ED DIAGNOSES  Final diagnoses:  Near syncope      NEW MEDICATIONS STARTED DURING THIS VISIT:  New Prescriptions   No medications on file     Note:  This document was prepared using Dragon voice recognition software and may include unintentional dictation errors.    Jene Every, MD 09/07/16 [phone]

## 2016-09-07 NOTE — ED Notes (Signed)
Pt waiting in lobby for ride, first nurse notified. Pt in wheelchair , NAD noted at this tim e

## 2016-09-07 NOTE — ED Provider Notes (Signed)
[facility]  I accepted care from Dr. Cyril LoosenKinner ____________________________________________    LABS (pertinent positives/negatives)  Labs Reviewed  BASIC METABOLIC PANEL - Abnormal; Notable for the following:       Result Value   Glucose, Bld 240 (*)    Creatinine, Ser 1.58 (*)    GFR calc non Af Amer 40 (*)    GFR calc Af Amer 46 (*)    All other components within normal limits  CBC - Abnormal; Notable for the following:    RBC 4.36 (*)    All other components within normal limits      ____________________________________________   PROCEDURES  Procedure(s) performed: None  Critical Care performed: None  ____________________________________________   INITIAL IMPRESSION / ASSESSMENT AND PLAN / ED COURSE   Pertinent labs & imaging results that were available during my care of the patient were reviewed by me and considered in my medical decision making (see chart for details).  Per Dr. Fransisca ConnorsKinner's plan, patient received IV fluids and then orthostatics. Patient received fluids and feels better.  I reviewed his laboratory studies. I have discussed with Dr. Dione BoozeKanner who did not recommend urinalysis her troponin at this point time. Patient complaining of any additional symptoms. I will go ahead and discharge and recommend outpatient follow up.    CONSULTATIONS:None   Patient / Family / Caregiver informed of clinical course, medical decision-making process, and agree with plan.   I discussed return precautions, follow-up instructions, and discharged instructions with patient and/or family.  Discharge instructions:  From Dr. Shaune PollackLord.  You were given IV fluids in the emergency department.  Return to the emergency room immediately for any worsening symptoms including passing out, chest pain, confusion or altered mental status, weakness, numbness, or any other symptoms concerning to you.    ____________________________________________   FINAL CLINICAL  IMPRESSION(S) / ED DIAGNOSES  Final diagnoses:  Near syncope        Governor RooksLord, Terral Cooks, MD 09/07/16 1722

## 2016-09-07 NOTE — Discharge Instructions (Addendum)
From Dr. Shaune PollackLord.  You were given IV fluids in the emergency department.  Return to the emergency room immediately for any worsening symptoms including passing out, chest pain, confusion or altered mental status, weakness, numbness, or any other symptoms concerning to you.

## 2016-09-08 ENCOUNTER — Encounter: Payer: Self-pay | Admitting: *Deleted

## 2016-09-08 DIAGNOSIS — J449 Chronic obstructive pulmonary disease, unspecified: Secondary | ICD-10-CM | POA: Insufficient documentation

## 2016-09-08 DIAGNOSIS — R2681 Unsteadiness on feet: Secondary | ICD-10-CM | POA: Diagnosis not present

## 2016-09-08 DIAGNOSIS — E1140 Type 2 diabetes mellitus with diabetic neuropathy, unspecified: Secondary | ICD-10-CM | POA: Diagnosis not present

## 2016-09-08 DIAGNOSIS — Z7982 Long term (current) use of aspirin: Secondary | ICD-10-CM | POA: Diagnosis not present

## 2016-09-08 DIAGNOSIS — Z79899 Other long term (current) drug therapy: Secondary | ICD-10-CM | POA: Diagnosis not present

## 2016-09-08 DIAGNOSIS — R42 Dizziness and giddiness: Secondary | ICD-10-CM | POA: Diagnosis present

## 2016-09-08 DIAGNOSIS — I2510 Atherosclerotic heart disease of native coronary artery without angina pectoris: Secondary | ICD-10-CM | POA: Insufficient documentation

## 2016-09-08 DIAGNOSIS — W1830XA Fall on same level, unspecified, initial encounter: Secondary | ICD-10-CM | POA: Diagnosis not present

## 2016-09-08 DIAGNOSIS — Z95810 Presence of automatic (implantable) cardiac defibrillator: Secondary | ICD-10-CM | POA: Insufficient documentation

## 2016-09-08 DIAGNOSIS — I110 Hypertensive heart disease with heart failure: Secondary | ICD-10-CM | POA: Insufficient documentation

## 2016-09-08 DIAGNOSIS — I509 Heart failure, unspecified: Secondary | ICD-10-CM | POA: Diagnosis not present

## 2016-09-08 NOTE — ED Notes (Signed)
Pt sitting in lobby in w/c with no distress noted; pt st that he lives with his son but he pays all the bills, st is not receiving any assistance at home with his ADLs and many times "has to crawl across the floor" for fear that he is going to fall; reports being "off balance" and falls frequently

## 2016-09-08 NOTE — ED Triage Notes (Signed)
Pt to triage via wheelchair.  Pt reports frequent falls.  Pt was seen here yesterday with similar sx.  Pt reports he needs help at home and is scared he is going to get hurt.  Pt alert.  Speech clear

## 2016-09-09 ENCOUNTER — Emergency Department: Payer: Medicare Other

## 2016-09-09 ENCOUNTER — Emergency Department
Admission: EM | Admit: 2016-09-09 | Discharge: 2016-09-09 | Disposition: A | Discharge status: Home / Self Care | Payer: Medicare Other | Attending: Emergency Medicine | Admitting: Emergency Medicine

## 2016-09-09 DIAGNOSIS — W19XXXA Unspecified fall, initial encounter: Secondary | ICD-10-CM

## 2016-09-09 DIAGNOSIS — R42 Dizziness and giddiness: Secondary | ICD-10-CM

## 2016-09-09 DIAGNOSIS — R2681 Unsteadiness on feet: Secondary | ICD-10-CM

## 2016-09-09 LAB — BASIC METABOLIC PANEL
ANION GAP: 9 (ref 5–15)
BUN: 12 mg/dL (ref 6–20)
CALCIUM: 9.9 mg/dL (ref 8.9–10.3)
CO2: 26 mmol/L (ref 22–32)
CREATININE: 1.3 mg/dL — AB (ref 0.61–1.24)
Chloride: 105 mmol/L (ref 101–111)
GFR, EST AFRICAN AMERICAN: 58 mL/min — AB (ref >60)
GFR, EST NON AFRICAN AMERICAN: 50 mL/min — AB (ref >60)
Glucose, Bld: 85 mg/dL (ref 65–99)
Potassium: 4 mmol/L (ref 3.5–5.1)
SODIUM: 140 mmol/L (ref 135–145)

## 2016-09-09 LAB — CBC
HCT: 47 % (ref 40.0–52.0)
Hemoglobin: 16 g/dL (ref 13.0–18.0)
MCH: 32.3 pg (ref 26.0–34.0)
MCHC: 34 g/dL (ref 32.0–36.0)
MCV: 94.9 fL (ref 80.0–100.0)
Platelets: 216 10*3/uL (ref 150–440)
RBC: 4.95 MIL/uL (ref 4.40–5.90)
RDW: 13.5 % (ref 11.5–14.5)
WBC: 6.1 10*3/uL (ref 3.8–10.6)

## 2016-09-09 LAB — TROPONIN I

## 2016-09-09 MED ORDER — ASPIRIN EC 81 MG PO TBEC
81.0000 mg | DELAYED_RELEASE_TABLET | Freq: Every day | ORAL | Status: DC
  Administered 2016-09-09: 81 mg via ORAL
  Filled 2016-09-09: qty 1

## 2016-09-09 MED ORDER — VITAMIN D 1000 UNITS PO TABS
1000.0000 [IU] | ORAL_TABLET | Freq: Two times a day (BID) | ORAL | Status: DC
  Administered 2016-09-09: 1000 [IU] via ORAL
  Filled 2016-09-09: qty 1

## 2016-09-09 MED ORDER — GABAPENTIN 300 MG PO CAPS
300.0000 mg | ORAL_CAPSULE | Freq: Three times a day (TID) | ORAL | Status: DC
  Administered 2016-09-09: 300 mg via ORAL
  Filled 2016-09-09 (×3): qty 1

## 2016-09-09 MED ORDER — SAW PALMETTO (SERENOA REPENS) 1000 MG PO CAPS: 1200.0000 mg | ORAL_CAPSULE | Freq: Every day | ORAL | Status: DC

## 2016-09-09 MED ORDER — POTASSIUM CHLORIDE ER 10 MEQ PO TBCR
10.0000 meq | EXTENDED_RELEASE_TABLET | Freq: Every day | ORAL | Status: DC
  Administered 2016-09-09: 10 meq via ORAL
  Filled 2016-09-09: qty 1

## 2016-09-09 MED ORDER — FUROSEMIDE 40 MG PO TABS
20.0000 mg | ORAL_TABLET | Freq: Every day | ORAL | Status: DC
  Administered 2016-09-09: 20 mg via ORAL
  Filled 2016-09-09: qty 1

## 2016-09-09 MED ORDER — RISAQUAD PO CAPS
1.0000 | ORAL_CAPSULE | Freq: Two times a day (BID) | ORAL | Status: DC
  Administered 2016-09-09: 1 via ORAL
  Filled 2016-09-09 (×2): qty 1

## 2016-09-09 MED ORDER — ACETAMINOPHEN 500 MG PO TABS: 500.0000 mg | ORAL_TABLET | Freq: Three times a day (TID) | ORAL | Status: DC | PRN

## 2016-09-09 MED ORDER — ATORVASTATIN CALCIUM 20 MG PO TABS: 40.0000 mg | ORAL_TABLET | Freq: Every day | ORAL | Status: DC

## 2016-09-09 MED ORDER — PANTOPRAZOLE SODIUM 40 MG PO TBEC
80.0000 mg | DELAYED_RELEASE_TABLET | Freq: Every day | ORAL | Status: DC
  Administered 2016-09-09: 80 mg via ORAL
  Filled 2016-09-09: qty 2

## 2016-09-09 MED ORDER — BUSPIRONE HCL 5 MG PO TABS
5.0000 mg | ORAL_TABLET | Freq: Two times a day (BID) | ORAL | Status: DC
  Administered 2016-09-09: 5 mg via ORAL
  Filled 2016-09-09: qty 1

## 2016-09-09 MED ORDER — IBUPROFEN 400 MG PO TABS: 200.0000 mg | ORAL_TABLET | Freq: Four times a day (QID) | ORAL | Status: DC | PRN

## 2016-09-09 MED ORDER — VITAMIN B-12 1000 MCG PO TABS
500.0000 ug | ORAL_TABLET | Freq: Every day | ORAL | Status: DC
  Administered 2016-09-09: 500 ug via ORAL
  Filled 2016-09-09: qty 1

## 2016-09-09 NOTE — Discharge Instructions (Signed)
Please follow-up with your primary care physician on Monday and return to the emergency department for any concerns.  Results for orders placed or performed during the hospital encounter of 09/09/16  CBC  Result Value Ref Range   WBC 6.1 3.8 - 10.6 K/uL   RBC 4.95 4.40 - 5.90 MIL/uL   Hemoglobin 16.0 13.0 - 18.0 g/dL   HCT 16.147.0 09.640.0 - 04.552.0 %   MCV 94.9 80.0 - 100.0 fL   MCH 32.3 26.0 - 34.0 pg   MCHC 34.0 32.0 - 36.0 g/dL   RDW 40.913.5 81.111.5 - 91.414.5 %   Platelets 216 150 - 440 K/uL  Basic metabolic panel  Result Value Ref Range   Sodium 140 135 - 145 mmol/L   Potassium 4.0 3.5 - 5.1 mmol/L   Chloride 105 101 - 111 mmol/L   CO2 26 22 - 32 mmol/L   Glucose, Bld 85 65 - 99 mg/dL   BUN 12 6 - 20 mg/dL   Creatinine, Ser 7.821.30 (H) 0.61 - 1.24 mg/dL   Calcium 9.9 8.9 - 95.610.3 mg/dL   GFR calc non Af Amer 50 (L) >60 mL/min   GFR calc Af Amer 58 (L) >60 mL/min   Anion gap 9 5 - 15  Troponin I  Result Value Ref Range   Troponin I <0.03 <0.03 ng/mL   Dg Chest 2 View  Result Date: 08/26/2016 CLINICAL DATA:  Shortness of breath with generalized weakness. EXAM: CHEST  2 VIEW COMPARISON:  Radiograph 04/19/2016.  CT 02/14/2016 FINDINGS: Left-sided pacemaker in place. Post median sternotomy and CABG. Chronic cardiomegaly is unchanged from prior exam. Mediastinal contours are unchanged. There is atherosclerosis of the thoracic aorta. Chronic hyperinflation and mild bronchial thickening. No consolidation, pulmonary edema, pleural effusion or pneumothorax. No acute osseous abnormalities are seen. Plate and screw fixation of the left clavicle. IMPRESSION: 1. Chronic cardiomegaly and hyperinflation. 2. No acute abnormality. Electronically Signed   By: Rubye OaksMelanie  Ehinger M.D.   On: 08/26/2016 17:22   Ct Head Wo Contrast  Result Date: 09/09/2016 CLINICAL DATA:  82 y/o  M; frequent falls. EXAM: CT HEAD WITHOUT CONTRAST TECHNIQUE: Contiguous axial images were obtained from the base of the skull through the vertex  without intravenous contrast. COMPARISON:  02/14/2016 CT of the head. FINDINGS: Brain: No evidence of acute infarction, hemorrhage, hydrocephalus, extra-axial collection or mass lesion/mass effect. 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 Small chronic lacunar infarctions within the caudate heads. Mild chronic microvascular ischemic changes parenchymal volume loss of the brain. Vascular: Moderate calcific atherosclerosis of carotid siphons. No hyperdense vessel. Skull: Normal. Negative for fracture or focal lesion. Sinuses/Orbits: Moderate diffuse paranasal sinus mucosal thickening greatest in the left frontal sinus with there are chronic inflammatory changes of the walls of the sinus. Normally aerated mastoid air cells. Normal orbits. Other: None. IMPRESSION: 1. No acute intracranial abnormality. 2. Stable chronic microvascular ischemic changes, small chronic cortical infarcts, and parenchymal volume loss of the brain. 3. Stable moderate paranasal sinus disease greatest in left frontal sinus. Electronically Signed   By: Mitzi HansenLance  Furusawa-Stratton M.D.   On: 09/09/2016 04:17

## 2016-09-09 NOTE — ED Notes (Signed)
Report called to Konrad DoloresKim Hicks at [facility].

## 2016-09-09 NOTE — ED Notes (Addendum)
Pt given breakfast and cup of coffee.

## 2016-09-09 NOTE — ED Notes (Signed)
Pt ambulated to toilet in room with minimal assistance.
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
## 2016-09-09 NOTE — Clinical Social Work Placement (Signed)
   CLINICAL SOCIAL WORK PLACEMENT  NOTE  Date:  09/09/2016  Patient Details  Name: [name] MRN: [id number] Date of Birth: [birth year]  Clinical Social Work is seeking post-discharge placement for this patient at the Skilled  Nursing Facility level of care (*CSW will initial, date and re-position this form in  chart as items are completed):  Yes   Patient/family provided with [facility] Clinical Social Work Department's list of facilities offering this level of care within the geographic area requested by the patient (or if unable, by the patient's family).  Yes   Patient/family informed of their freedom to choose among providers that offer the needed level of care, that participate in Medicare, Medicaid or managed care program needed by the patient, have an available bed and are willing to accept the patient.  Yes   Patient/family informed of [facility]'s ownership interest in [facility] and [facility], as well as of the fact that they are under no obligation to receive care at these facilities.  PASRR submitted to EDS on       PASRR number received on       Existing PASRR number confirmed on 09/09/16     FL2 transmitted to all facilities in geographic area requested by pt/family on 09/09/16     FL2 transmitted to all facilities within larger geographic area on       Patient informed that his/her managed care company has contracts with or will negotiate with certain facilities, including the following:        Yes   Patient/family informed of bed offers received.  Patient chooses bed at  ([facility] )     Physician recommends and patient chooses bed at      Patient to be transferred to  [facility] on 09/09/16.  [facility]     Patient family notified on 09/09/16 of transfer.  Name of family member notified:   (CSW left patient's grandson [name] a Engineer, technical salesvoicemail. )     PHYSICIAN       Additional  Comment:    _______________________________________________ Cevin Rubinstein, Darleen CrockerBailey M, LCSW 09/09/2016, 11:48 AM

## 2016-09-09 NOTE — ED Notes (Signed)
Pt reports difficulty walking and getting around the house. Pt states "I get really nervous that I am going to fall down." Pt states when he is at home he is afraid to get around because he does not want to fall. Pt A&O at this time.

## 2016-09-09 NOTE — NC FL2 (Signed)
[place] MEDICAID FL2 LEVEL OF CARE SCREENING TOOL     IDENTIFICATION  Patient Name: [name] Birthdate: 06/17/1934 Sex: male Admission Date (Current Location): 09/09/2016  [place]   Facility and Address:  [facility], [address], [place], [place] [address]      Provider Number: [id number]  Attending Physician Name and Address:  Rebecka ApleyWebster, Allison P, MD  Relative Name and Phone Number:       Current Level of Care: Hospital Recommended Level of Care: Skilled Nursing Facility Prior Approval Number:    Date Approved/Denied:   PASRR Number:  [id number] [id number] )  Discharge Plan: SNF    Current Diagnoses: Patient Active Problem List   Diagnosis Date Noted  . Diabetic foot ulcers (HCC) 07/08/2016  . Congestive heart failure (CHF) (HCC) 06/14/2016  . Medication monitoring encounter 06/14/2016  . Hyperthyroidism 12/04/2015  . Unsteady gait 12/04/2015  . 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 Abnormal serum protein electrophoresis 12/05/2014  . Pressure ulcer 11/15/2014  . Hyperproteinemia 11/02/2014  . Hypoalbuminemia 11/02/2014  . Arthritis 10/23/2014  . Arteriosclerosis of coronary artery 10/23/2014  . Diabetic foot ulcer associated with type 2 diabetes mellitus (HCC) 10/23/2014  . Amputation of finger of left hand 10/23/2014  . History of pulmonary embolism 10/23/2014  . Literacy problem 10/23/2014  . Umbilical hernia without obstruction or gangrene 10/23/2014  . Vitamin B12 deficiency 10/23/2014  . Neuropathy of lower extremity 10/23/2014  . History of osteomyelitis 10/23/2014  . Artificial cardiac pacemaker 08/10/2013  . H/O coronary artery bypass surgery 08/10/2013  . Benign prostatic hyperplasia with urinary obstruction 09/27/2012  . Abnormal prostate specific antigen 09/27/2012     Orientation RESPIRATION BLADDER Height & Weight     Self, Time, Situation, Place  Normal Continent Weight: 200 lb (90.7 kg) Height:  6' (182.9 cm)  BEHAVIORAL SYMPTOMS/MOOD NEUROLOGICAL BOWEL NUTRITION STATUS   (none)  (none) Continent Diet (Regular Diet )  AMBULATORY STATUS COMMUNICATION OF NEEDS Skin   Extensive Assist Verbally Normal                       Personal Care Assistance Level of Assistance  Bathing, Feeding, Dressing Bathing Assistance: Limited assistance Feeding assistance: Independent Dressing Assistance: Limited assistance     Functional Limitations Info  Sight, Hearing, Speech Sight Info: Adequate Hearing Info: Adequate Speech Info: Adequate    SPECIAL CARE FACTORS FREQUENCY  PT (By licensed PT), OT (By licensed OT)     PT Frequency:  (5) OT Frequency:  (5)            Contractures      Additional Factors Info  Code Status, Allergies Code Status Info:  (Full Code. ) Allergies Info:  (Oxycodone-acetaminophen, Percocet Oxycodone-acetaminophen)           Current Medications (09/09/2016):  This is the current hospital active medication list Current Facility-Administered Medications  Medication Dose Route Frequency Provider Last Rate Last Dose  . acetaminophen (TYLENOL) tablet 500 mg  500 mg Oral Q8H PRN Rebecka ApleyWebster, Allison P, MD      . acidophilus (RISAQUAD) capsule 1 capsule  1 capsule Oral BID Rebecka ApleyWebster, Allison P, MD      . aspirin EC tablet 81 mg  81 mg Oral Daily Rebecka ApleyWebster, Allison P, MD      . atorvastatin (LIPITOR) tablet 40 mg  40 mg Oral QHS  Rebecka ApleyWebster, Allison P, MD      . busPIRone (BUSPAR) tablet 5 mg  5 mg Oral BID Rebecka ApleyWebster, Allison P, MD      . cholecalciferol (VITAMIN D) tablet 1,000 Units  1,000 Units Oral BID Rebecka ApleyWebster, Allison P, MD      . furosemide (LASIX) tablet 20 mg  20 mg Oral Daily Rebecka ApleyWebster, Allison P, MD      . gabapentin (NEURONTIN) capsule 300 mg  300 mg Oral TID Rebecka ApleyWebster, Allison P, MD      . ibuprofen (ADVIL,MOTRIN) tablet  200 mg  200 mg Oral Q6H PRN Rebecka ApleyWebster, Allison P, MD      . pantoprazole (PROTONIX) EC tablet 80 mg  80 mg Oral Daily Rebecka ApleyWebster, Allison P, MD      . potassium chloride (K-DUR) CR tablet 10 mEq  10 mEq Oral Daily Rebecka ApleyWebster, Allison P, MD      . vitamin B-12 (CYANOCOBALAMIN) tablet 500 mcg  500 mcg Oral Daily Rebecka ApleyWebster, Allison P, MD       Current Outpatient Prescriptions  Medication Sig Dispense Refill  . acetaminophen (TYLENOL) 500 MG tablet Take 500 mg by mouth every 8 (eight) hours as needed.    . Acidophilus Lactobacillus CAPS Take 1 capsule by mouth 2 (two) times daily.    [name]. aspirin EC 81 MG tablet Take 1 tablet (81 mg total) by mouth daily. 30 tablet 11  . atorvastatin (LIPITOR) 40 MG tablet Take 1 tablet (40 mg total) by mouth at bedtime. 90 tablet 1  . busPIRone (BUSPAR) 5 MG tablet Take 5 mg by mouth 2 (two) times daily as needed.    . cholecalciferol (VITAMIN D) 1000 units tablet Take 1,000 Units by mouth 2 (two) times daily.    . furosemide (LASIX) 20 MG tablet Take 1 tablet (20 mg total) by mouth daily. 30 tablet 0  . gabapentin (NEURONTIN) 300 MG capsule Take 1 capsule (300 mg total) by mouth 3 (three) times daily. 90 capsule 5  . ibuprofen (ADVIL,MOTRIN) 200 MG tablet Take 200 mg by mouth every 6 (six) hours as needed.    [name]. omeprazole (PRILOSEC) 40 MG capsule Take 1 capsule (40 mg total) by mouth daily. 30 capsule 1  . potassium chloride (K-DUR) 10 MEQ tablet Take 1 tablet (10 mEq total) by mouth daily. 30 tablet 0  . Saw Palmetto, Serenoa repens, 1000 MG CAPS Take 1,200 mg by mouth daily.    . vitamin B-12 (CYANOCOBALAMIN) 500 MCG tablet Take 500 mcg by mouth daily.       Discharge Medications: Please see discharge summary for a list of discharge medications.  Relevant Imaging Results:  Relevant Lab Results:   Additional Information  (SSN: [id number])  Norma Montemurro, Darleen CrockerBailey M, LCSW

## 2016-09-09 NOTE — ED Provider Notes (Addendum)
I was called by social work that she will send me the patient's FL 2 shortly and she is working on placement now.   [name], Jaxsyn Catalfamo, MD 09/09/16 [phone]  The patient has been accepted to [facility].   [name], Lynnzie Blackson, MD 09/09/16 1112

## 2016-09-14 ENCOUNTER — Telehealth: Payer: Self-pay

## 2016-09-14 NOTE — Telephone Encounter (Signed)
Shawn, Child psychotherapistsocial worker  from [facility] called to give us some update information regarding pt. She states the pt walked out couple a minutes after she called our office. Pt complain that he was not given enough food or salt. She states she mention to pt that is is on a salt restriction and he would get upset. Pt  was noncompliant with rehab and anything that he was asked to do. Pt signed released forms and left. If you have any questions her call back # is [phone].

## 2016-09-14 NOTE — Telephone Encounter (Signed)
Someone picked him up per staff Thank you for letting me know

## 2016-09-20 ENCOUNTER — Encounter: Payer: Self-pay | Admitting: Family Medicine

## 2016-10-11 ENCOUNTER — Inpatient Hospital Stay: Payer: Medicare Other

## 2016-10-11 ENCOUNTER — Other Ambulatory Visit: Payer: Self-pay | Admitting: *Deleted

## 2016-10-11 ENCOUNTER — Encounter: Payer: Self-pay | Admitting: Oncology

## 2016-10-11 ENCOUNTER — Inpatient Hospital Stay: Payer: Medicare Other | Attending: Oncology | Admitting: Oncology

## 2016-10-11 VITALS — BP 119/66 | HR 80 | Temp 94.7°F | Resp 18 | Ht 71.46 in | Wt 200.3 lb

## 2016-10-11 DIAGNOSIS — N400 Enlarged prostate without lower urinary tract symptoms: Secondary | ICD-10-CM | POA: Insufficient documentation

## 2016-10-11 DIAGNOSIS — Z950 Presence of cardiac pacemaker: Secondary | ICD-10-CM | POA: Diagnosis not present

## 2016-10-11 DIAGNOSIS — K429 Umbilical hernia without obstruction or gangrene: Secondary | ICD-10-CM | POA: Diagnosis not present

## 2016-10-11 DIAGNOSIS — R778 Other specified abnormalities of plasma proteins: Secondary | ICD-10-CM

## 2016-10-11 DIAGNOSIS — Z86711 Personal history of pulmonary embolism: Secondary | ICD-10-CM | POA: Diagnosis not present

## 2016-10-11 DIAGNOSIS — Z951 Presence of aortocoronary bypass graft: Secondary | ICD-10-CM | POA: Diagnosis not present

## 2016-10-11 DIAGNOSIS — I2510 Atherosclerotic heart disease of native coronary artery without angina pectoris: Secondary | ICD-10-CM | POA: Diagnosis not present

## 2016-10-11 DIAGNOSIS — Z7982 Long term (current) use of aspirin: Secondary | ICD-10-CM | POA: Insufficient documentation

## 2016-10-11 DIAGNOSIS — Z86718 Personal history of other venous thrombosis and embolism: Secondary | ICD-10-CM | POA: Insufficient documentation

## 2016-10-11 DIAGNOSIS — E1169 Type 2 diabetes mellitus with other specified complication: Secondary | ICD-10-CM | POA: Diagnosis not present

## 2016-10-11 DIAGNOSIS — M1990 Unspecified osteoarthritis, unspecified site: Secondary | ICD-10-CM | POA: Diagnosis not present

## 2016-10-11 DIAGNOSIS — E785 Hyperlipidemia, unspecified: Secondary | ICD-10-CM | POA: Insufficient documentation

## 2016-10-11 DIAGNOSIS — E538 Deficiency of other specified B group vitamins: Secondary | ICD-10-CM | POA: Insufficient documentation

## 2016-10-11 DIAGNOSIS — K219 Gastro-esophageal reflux disease without esophagitis: Secondary | ICD-10-CM | POA: Diagnosis not present

## 2016-10-11 DIAGNOSIS — J449 Chronic obstructive pulmonary disease, unspecified: Secondary | ICD-10-CM | POA: Insufficient documentation

## 2016-10-11 DIAGNOSIS — I255 Ischemic cardiomyopathy: Secondary | ICD-10-CM | POA: Diagnosis not present

## 2016-10-11 DIAGNOSIS — I110 Hypertensive heart disease with heart failure: Secondary | ICD-10-CM | POA: Insufficient documentation

## 2016-10-11 DIAGNOSIS — Z79899 Other long term (current) drug therapy: Secondary | ICD-10-CM | POA: Diagnosis not present

## 2016-10-11 DIAGNOSIS — M869 Osteomyelitis, unspecified: Secondary | ICD-10-CM | POA: Diagnosis not present

## 2016-10-11 LAB — CBC WITH DIFFERENTIAL/PLATELET
BASOS ABS: 0 10*3/uL (ref 0–0.1)
Basophils Relative: 1 %
EOS PCT: 3 %
Eosinophils Absolute: 0.2 10*3/uL (ref 0–0.7)
HCT: 40.3 % (ref 40.0–52.0)
Hemoglobin: 14.1 g/dL (ref 13.0–18.0)
LYMPHS PCT: 22 %
Lymphs Abs: 1.3 10*3/uL (ref 1.0–3.6)
MCH: 32.1 pg (ref 26.0–34.0)
MCHC: 34.9 g/dL (ref 32.0–36.0)
MCV: 92.1 fL (ref 80.0–100.0)
Monocytes Absolute: 0.7 10*3/uL (ref 0.2–1.0)
Monocytes Relative: 12 %
Neutro Abs: 3.8 10*3/uL (ref 1.4–6.5)
Neutrophils Relative %: 62 %
PLATELETS: 186 10*3/uL (ref 150–440)
RBC: 4.37 MIL/uL — ABNORMAL LOW (ref 4.40–5.90)
RDW: 13.9 % (ref 11.5–14.5)
WBC: 6.1 10*3/uL (ref 3.8–10.6)

## 2016-10-11 LAB — COMPREHENSIVE METABOLIC PANEL
ALT: 21 U/L (ref 17–63)
AST: 32 U/L (ref 15–41)
Albumin: 4 g/dL (ref 3.5–5.0)
Alkaline Phosphatase: 89 U/L (ref 38–126)
Anion gap: 5 (ref 5–15)
BUN: 11 mg/dL (ref 6–20)
CHLORIDE: 105 mmol/L (ref 101–111)
CO2: 27 mmol/L (ref 22–32)
CREATININE: 1.19 mg/dL (ref 0.61–1.24)
Calcium: 9.6 mg/dL (ref 8.9–10.3)
GFR calc Af Amer: >60 mL/min (ref >60)
GFR, EST NON AFRICAN AMERICAN: 56 mL/min — AB (ref >60)
Glucose, Bld: 104 mg/dL — ABNORMAL HIGH (ref 65–99)
Potassium: 3.5 mmol/L (ref 3.5–5.1)
SODIUM: 137 mmol/L (ref 135–145)
Total Bilirubin: 0.7 mg/dL (ref 0.3–1.2)
Total Protein: 8 g/dL (ref 6.5–8.1)

## 2016-10-11 LAB — LACTATE DEHYDROGENASE: LDH: 162 U/L (ref 98–192)

## 2016-10-11 LAB — VITAMIN B12: Vitamin B-12: 663 pg/mL (ref 180–914)

## 2016-10-11 NOTE — Progress Notes (Signed)
Hematology/Oncology Consult note [facility] Telephone:([phone] Fax:(336) [phone]  Patient Care Team: Arnetha Courser, MD as PCP - General (Family Medicine) [place], [place] (Podiatry)   Name of the patient: [name]  [id number]  13-Oct-1934    Reason for referral- abnormal spep   Referring physician- Dr. Sanda Klein  Date of visit: 10/11/16   History of presenting illness- patient is a 82 year old male with possible medical history significant for congestive heart failure, hypertension hyperlipidemia diabetes and peripheral neuropathy among other medical problems. He has been referred to [place] for abnormal SPEP. SPEP done on 04/30/2016 showed an elevated beta-2 globulin of 0.7. No M protein was detected. He has been referred to [place] for the same. Off note he has had SPEP done in 2016 and 2017 which did not show any evidence of M protein. CBC from 09/09/2016 was within normal limits. BMP showed mildly elevated creatinine of 1.3. Calcium was normal. Patient reports swelling and burning sensation in b/l feet  ECOG PS- 1  Pain scale- 3   Review of systems- Review of Systems  Constitutional: Positive for malaise/fatigue. Negative for chills, fever and weight loss.  HENT: Negative for congestion, ear discharge and nosebleeds.   Eyes: Negative for blurred vision.  Respiratory: Negative for cough, hemoptysis, sputum production, shortness of breath and wheezing.   Cardiovascular: Positive for leg swelling. Negative for chest pain, palpitations, orthopnea and claudication.  Gastrointestinal: Negative for abdominal pain, blood in stool, constipation, diarrhea, heartburn, melena, nausea and vomiting.  Genitourinary: Negative for dysuria, flank pain, frequency, hematuria and urgency.  Musculoskeletal: Negative for back pain, joint pain and myalgias.  Skin: Negative for rash.  Neurological: Negative for dizziness, tingling, focal weakness, seizures, weakness and  headaches.  Endo/Heme/Allergies: Does not bruise/bleed easily.  Psychiatric/Behavioral: Negative for depression and suicidal ideas. The patient does not have insomnia.     Allergies  Allergen Reactions  . Oxycodone-Acetaminophen Other (See Comments)    Causes the pt to be very irritable, says he can take this  . Percocet [Oxycodone-Acetaminophen] Other (See Comments)    Reaction:  Irritability     Patient Active Problem List   Diagnosis Date Noted  . Diabetic foot ulcers ([place]) 07/08/2016  . Congestive heart failure (CHF) ([place]) 06/14/2016  . Medication monitoring encounter 06/14/2016  . Hyperthyroidism 12/04/2015  . Unsteady gait 12/04/2015  . 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 Abnormal serum protein electrophoresis 12/05/2014  . Pressure ulcer 11/15/2014  . Hyperproteinemia 11/02/2014  . Hypoalbuminemia 11/02/2014  . Arthritis 10/23/2014  . Arteriosclerosis of coronary artery 10/23/2014  . Diabetic foot ulcer associated with type 2 diabetes mellitus ([place]) 10/23/2014  . Amputation of finger of left hand 10/23/2014  . History of pulmonary embolism 10/23/2014  . Literacy problem 10/23/2014  . Umbilical hernia without obstruction or gangrene 10/23/2014  . Vitamin B12 deficiency 10/23/2014  . Neuropathy of lower extremity 10/23/2014  . History of osteomyelitis 10/23/2014  . Artificial cardiac pacemaker 08/10/2013  . H/O coronary artery bypass surgery 08/10/2013  . Benign prostatic hyperplasia with urinary obstruction 09/27/2012  . Abnormal prostate specific antigen 09/27/2012     Past Medical History:  Diagnosis Date  . Arthritis   . CAD (coronary artery disease)   . Congestive heart failure (CHF) ([place]) 06/14/2016   LVEF 40-45% 2016 echo; Dr. Saralyn Pilar  . COPD (chronic obstructive pulmonary disease) ([place])   . Diabetes mellitus without complication ([place])    .  Family history of adverse reaction to anesthesia    mom seizures   . GERD (gastroesophageal reflux disease)   . History of blood clots in legs   . History of ischemic cardiomyopathy   . 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 Hyperlipidemia   . Hypertension   . Literacy level of illiterate   . Osteomyelitis ([place])   . PN (peripheral neuropathy)      Past Surgical History:  Procedure Laterality Date  . amputation 4th finger Left   . APPENDECTOMY    . CLAVICLE SURGERY  2012   open reduction and internal fixation of left clavicle  . CORONARY ARTERY BYPASS GRAFT  2002  . FOOT SURGERY    . IMPLANTABLE CARDIOVERTER DEFIBRILLATOR (ICD) GENERATOR CHANGE N/A 12/03/2015   Procedure: ICD GENERATOR CHANGE;  Surgeon: Isaias Cowman, MD;  Location: [facility] ORS;  Service: Cardiovascular;  Laterality: N/A;  . PACEMAKER INSERTION      Social History   Social History  . Marital status: Divorced    Spouse name: N/A  . Number of children: N/A  . Years of education: N/A   Occupational History  . Not on file.   Social History Main Topics  . Smoking status: Current Every Day Smoker    Packs/day: 0.25    Years: 10.00    Types: Cigarettes, Cigars    Last attempt to quit: 10/18/1979  . Smokeless tobacco: Never Used  . Alcohol use No  . Drug use: No  . Sexual activity: Not on file   Other Topics Concern  . Not on file   Social History Narrative  . No narrative on file     Family History  Problem Relation Age of Onset  . Cancer Mother        spine  . Hypertension Mother   . Mental illness Mother   . Cancer Father        lung  . Asthma Father   . Allergic rhinitis Father   . Arthritis Father   . Tuberculosis Father   . Gout Brother   . Allergic rhinitis Brother      Current Outpatient Prescriptions:  .  acetaminophen (TYLENOL) 500 MG tablet, Take 500 mg by mouth every 8 (eight) hours as needed., Disp: , Rfl:  .  Acidophilus Lactobacillus CAPS, Take 1 capsule by mouth 2  (two) times daily., Disp: , Rfl:  .  atorvastatin (LIPITOR) 40 MG tablet, Take 1 tablet (40 mg total) by mouth at bedtime., Disp: 90 tablet, Rfl: 1 .  furosemide (LASIX) 20 MG tablet, Take 1 tablet (20 mg total) by mouth daily., Disp: 30 tablet, Rfl: 0 .  gabapentin (NEURONTIN) 300 MG capsule, Take 1 capsule (300 mg total) by mouth 3 (three) times daily., Disp: 90 capsule, Rfl: 5 .  ibuprofen (ADVIL,MOTRIN) 200 MG tablet, Take 200 mg by mouth every 6 (six) hours as needed., Disp: , Rfl:  .  omeprazole (PRILOSEC) 40 MG capsule, Take 1 capsule (40 mg total) by mouth daily., Disp: 30 capsule, Rfl: 1 .  potassium chloride (K-DUR) 10 MEQ tablet, Take 1 tablet (10 mEq total) by mouth daily., Disp: 30 tablet, Rfl: 0 .  Saw Palmetto, Serenoa repens, 1000 MG CAPS, Take 1,200 mg by mouth daily., Disp: , Rfl:  .  vitamin B-12 (CYANOCOBALAMIN) 500 MCG tablet, Take 500 mcg by mouth daily., Disp: , Rfl:  .  aspirin EC 81 MG tablet, Take 1 tablet (81 mg total) by mouth daily. (Patient not taking: Reported on 10/11/2016), Disp: 30  tablet, Rfl: 11 .  busPIRone (BUSPAR) 5 MG tablet, Take 5 mg by mouth 2 (two) times daily as needed., Disp: , Rfl:  .  cholecalciferol (VITAMIN D) 1000 units tablet, Take 1,000 Units by mouth 2 (two) times daily., Disp: , Rfl:    Physical exam:  Vitals:   10/11/16 1056  BP: 119/66  Pulse: 80  Resp: 18  Temp: (!) 94.7 F (34.8 C)  TempSrc: Tympanic  Weight: 200 lb 4.6 oz (90.9 kg)  Height: 5' 11.46" (1.815 m)   Physical Exam  Constitutional: He is oriented to person, place, and time and well-developed, well-nourished, and in no distress.  HENT:  Head: Normocephalic and atraumatic.  Poor dentition  Eyes: Pupils are equal, round, and reactive to light. EOM are normal.  Neck: Normal range of motion.  Cardiovascular: Normal rate, regular rhythm and normal heart sounds.   Pulmonary/Chest: Effort normal and breath sounds normal.  Abdominal: Soft. Bowel sounds are normal.    Umbilical hernia  Musculoskeletal: He exhibits edema.  Neurological: He is alert and oriented to person, place, and time.  Skin: Skin is warm and dry.       CMP Latest Ref Rng & Units 10/11/2016  Glucose 65 - 99 mg/dL 104(H)  BUN 6 - 20 mg/dL 11  Creatinine 0.61 - 1.24 mg/dL 1.19  Sodium 135 - 145 mmol/L 137  Potassium 3.5 - 5.1 mmol/L 3.5  Chloride 101 - 111 mmol/L 105  CO2 22 - 32 mmol/L 27  Calcium 8.9 - 10.3 mg/dL 9.6  Total Protein 6.5 - 8.1 g/dL 8.0  Total Bilirubin 0.3 - 1.2 mg/dL 0.7  Alkaline Phos 38 - 126 U/L 89  AST 15 - 41 U/L 32  ALT 17 - 63 U/L 21   CBC Latest Ref Rng & Units 10/11/2016  WBC 3.8 - 10.6 K/uL 6.1  Hemoglobin 13.0 - 18.0 g/dL 14.1  Hematocrit 40.0 - 52.0 % 40.3  Platelets 150 - 440 K/uL 186     Assessment and plan- Patient is a 82 y.o. male referred for abnormal spep  Spep did not show any evidence of M protein. Elevated beta 2 microglobulin is non specific and can be seen with acute inflammation as well. Today I will check cbc, cmp, myeloma panel, random urine electrophoresis and ldh. I will see the patient back in 2 weeks time. If no M protein is detected, patient likely does not have hematological disorder like multiple myeloma. His symptoms of peripheral neuropathy could be secondary to his diabetes.    Thank you for this kind referral and the opportunity to participate in the care of this patient   Visit Diagnosis 1. Abnormal SPEP     Dr. Randa Evens, MD, MPH [facility] at [facility] Pager- [phone] 10/11/2016

## 2016-10-11 NOTE — Progress Notes (Signed)
Here  for new pt eval  

## 2016-10-11 NOTE — Progress Notes (Deleted)
Hematology/Oncology Consult note [facility]  Telephone:([phone] Fax:([phone]  Patient Care Team: Kerman Passey, MD as PCP - General (Family Medicine) Linus Galas, [place] (Podiatry)   Name of the patient: [name]  [id number]  16-Oct-1934   Date of visit: 10/11/16  Diagnosis- ***  Chief complaint/ Reason for visit- ***  Heme/Onc history: ***  Interval history- ***  ECOG PS- *** Pain scale- *** Opioid associated constipation- ***  Review of systems- ROS   Current treatment- ***  Allergies  Allergen Reactions  . Oxycodone-Acetaminophen Other (See Comments)    Causes the pt to be very irritable, says he can take this  . Percocet [Oxycodone-Acetaminophen] Other (See Comments)    Reaction:  Irritability      Past Medical History:  Diagnosis Date  . Arthritis   . CAD (coronary artery disease)   . Congestive heart failure (CHF) (HCC) 06/14/2016   LVEF 40-45% 2016 echo; Dr. Darrold Junker  . COPD (chronic obstructive pulmonary disease) (HCC)   . Diabetes mellitus without complication (HCC)   . Family history of adverse reaction to anesthesia    mom seizures   . GERD (gastroesophageal reflux disease)   . History of blood clots in legs   . History of ischemic cardiomyopathy   . 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 Hyperlipidemia   . Hypertension   . Literacy level of illiterate   . Osteomyelitis (HCC)   . PN (peripheral neuropathy)      Past Surgical History:  Procedure Laterality Date  . amputation 4th finger Left   . APPENDECTOMY    . CLAVICLE SURGERY  2012   open reduction and internal fixation of left clavicle  . CORONARY ARTERY BYPASS GRAFT  2002  . FOOT SURGERY    . IMPLANTABLE CARDIOVERTER DEFIBRILLATOR (ICD) GENERATOR CHANGE N/A 12/03/2015   Procedure: ICD GENERATOR CHANGE;  Surgeon: Marcina Millard, MD;  Location: [facility] ORS;  Service: Cardiovascular;  Laterality: N/A;  . PACEMAKER INSERTION      Social  History   Social History  . Marital status: Divorced    Spouse name: N/A  . Number of children: N/A  . Years of education: N/A   Occupational History  . Not on file.   Social History Main Topics  . Smoking status: Current Every Day Smoker    Packs/day: 0.25    Years: 10.00    Types: Cigarettes, Cigars    Last attempt to quit: 10/18/1979  . Smokeless tobacco: Never Used  . Alcohol use No  . Drug use: No  . Sexual activity: Not on file   Other Topics Concern  . Not on file   Social History Narrative  . No narrative on file    Family History  Problem Relation Age of Onset  . Cancer Mother        spine  . Hypertension Mother   . Mental illness Mother   . Cancer Father        lung  . Asthma Father   . Allergic rhinitis Father   . Arthritis Father   . Tuberculosis Father   . Gout Brother   . Allergic rhinitis Brother      Current Outpatient Prescriptions:  .  acetaminophen (TYLENOL) 500 MG tablet, Take 500 mg by mouth every 8 (eight) hours as needed., Disp: , Rfl:  .  Acidophilus Lactobacillus CAPS, Take 1 capsule by mouth 2 (two) times daily., Disp: , Rfl:  .  atorvastatin (LIPITOR) 40 MG tablet, Take  1 tablet (40 mg total) by mouth at bedtime., Disp: 90 tablet, Rfl: 1 .  furosemide (LASIX) 20 MG tablet, Take 1 tablet (20 mg total) by mouth daily., Disp: 30 tablet, Rfl: 0 .  gabapentin (NEURONTIN) 300 MG capsule, Take 1 capsule (300 mg total) by mouth 3 (three) times daily., Disp: 90 capsule, Rfl: 5 .  ibuprofen (ADVIL,MOTRIN) 200 MG tablet, Take 200 mg by mouth every 6 (six) hours as needed., Disp: , Rfl:  .  omeprazole (PRILOSEC) 40 MG capsule, Take 1 capsule (40 mg total) by mouth daily., Disp: 30 capsule, Rfl: 1 .  potassium chloride (K-DUR) 10 MEQ tablet, Take 1 tablet (10 mEq total) by mouth daily., Disp: 30 tablet, Rfl: 0 .  Saw Palmetto, Serenoa repens, 1000 MG CAPS, Take 1,200 mg by mouth daily., Disp: , Rfl:  .  vitamin B-12 (CYANOCOBALAMIN) 500 MCG tablet,  Take 500 mcg by mouth daily., Disp: , Rfl:  .  aspirin EC 81 MG tablet, Take 1 tablet (81 mg total) by mouth daily. (Patient not taking: Reported on 10/11/2016), Disp: 30 tablet, Rfl: 11 .  busPIRone (BUSPAR) 5 MG tablet, Take 5 mg by mouth 2 (two) times daily as needed., Disp: , Rfl:  .  cholecalciferol (VITAMIN D) 1000 units tablet, Take 1,000 Units by mouth 2 (two) times daily., Disp: , Rfl:   Physical exam:  Vitals:   10/11/16 1056  BP: 119/66  Pulse: 80  Resp: 18  Temp: (!) 94.7 F (34.8 C)  TempSrc: Tympanic  Weight: 200 lb 4.6 oz (90.9 kg)  Height: 5' 11.46" (1.815 m)   Physical Exam   CMP Latest Ref Rng & Units 10/11/2016  Glucose 65 - 99 mg/dL 629(U)  BUN 6 - 20 mg/dL 11  Creatinine 7.65 - 4.65 mg/dL 0.35  Sodium 465 - 681 mmol/L 137  Potassium 3.5 - 5.1 mmol/L 3.5  Chloride 101 - 111 mmol/L 105  CO2 22 - 32 mmol/L 27  Calcium 8.9 - 10.3 mg/dL 9.6  Total Protein 6.5 - 8.1 g/dL 8.0  Total Bilirubin 0.3 - 1.2 mg/dL 0.7  Alkaline Phos 38 - 126 U/L 89  AST 15 - 41 U/L 32  ALT 17 - 63 U/L 21   CBC Latest Ref Rng & Units 10/11/2016  WBC 3.8 - 10.6 K/uL 6.1  Hemoglobin 13.0 - 18.0 g/dL 27.5  Hematocrit 17.0 - 52.0 % 40.3  Platelets 150 - 440 K/uL 186    No images are attached to the encounter.  No results found.   Assessment and plan- Patient is a 82 y.o. male ***   Visit Diagnosis 1. Abnormal SPEP      Dr. Owens Shark, MD, MPH [facility] at [facility] Pager- [phone] 10/11/2016 1:33 PM

## 2016-10-12 ENCOUNTER — Encounter: Payer: Self-pay | Admitting: *Deleted

## 2016-10-12 ENCOUNTER — Emergency Department
Admission: EM | Admit: 2016-10-12 | Discharge: 2016-10-12 | Disposition: A | Discharge status: Home / Self Care | Payer: Medicare Other | Attending: Emergency Medicine | Admitting: Emergency Medicine

## 2016-10-12 DIAGNOSIS — R600 Localized edema: Secondary | ICD-10-CM | POA: Insufficient documentation

## 2016-10-12 DIAGNOSIS — R609 Edema, unspecified: Secondary | ICD-10-CM

## 2016-10-12 DIAGNOSIS — J449 Chronic obstructive pulmonary disease, unspecified: Secondary | ICD-10-CM | POA: Insufficient documentation

## 2016-10-12 DIAGNOSIS — Z7982 Long term (current) use of aspirin: Secondary | ICD-10-CM | POA: Insufficient documentation

## 2016-10-12 DIAGNOSIS — E119 Type 2 diabetes mellitus without complications: Secondary | ICD-10-CM | POA: Insufficient documentation

## 2016-10-12 DIAGNOSIS — I259 Chronic ischemic heart disease, unspecified: Secondary | ICD-10-CM | POA: Insufficient documentation

## 2016-10-12 DIAGNOSIS — I10 Essential (primary) hypertension: Secondary | ICD-10-CM | POA: Insufficient documentation

## 2016-10-12 DIAGNOSIS — F17210 Nicotine dependence, cigarettes, uncomplicated: Secondary | ICD-10-CM | POA: Diagnosis not present

## 2016-10-12 DIAGNOSIS — Z79899 Other long term (current) drug therapy: Secondary | ICD-10-CM | POA: Insufficient documentation

## 2016-10-12 LAB — MULTIPLE MYELOMA PANEL, SERUM
ALBUMIN SERPL ELPH-MCNC: 3.7 g/dL (ref 2.9–4.4)
ALBUMIN/GLOB SERPL: 1 (ref 0.7–1.7)
ALPHA 1: 0.2 g/dL (ref 0.0–0.4)
ALPHA2 GLOB SERPL ELPH-MCNC: 0.5 g/dL (ref 0.4–1.0)
B-Globulin SerPl Elph-Mcnc: 1.5 g/dL — ABNORMAL HIGH (ref 0.7–1.3)
GAMMA GLOB SERPL ELPH-MCNC: 1.7 g/dL (ref 0.4–1.8)
GLOBULIN, TOTAL: 4 g/dL — AB (ref 2.2–3.9)
IGG (IMMUNOGLOBIN G), SERUM: 1762 mg/dL — AB (ref 700–1600)
IgA: 730 mg/dL — ABNORMAL HIGH (ref 61–437)
IgM (Immunoglobulin M), Srm: 98 mg/dL (ref 15–143)
Total Protein ELP: 7.7 g/dL (ref 6.0–8.5)

## 2016-10-12 NOTE — ED Provider Notes (Signed)
[facility] Emergency Department Provider Note   ____________________________________________    I have reviewed the triage vital signs and the nursing notes.   HISTORY  Chief Complaint Leg Swelling     HPI [name] is a 82 y.o. male who presents because of chronic swelling of his legs. Patient reports both legs have been swollen for many months. He reports last night his feet felt cold for approximately 15 minutes and he had to wrap him up in the blanket but then they felt better. He decided that he has had this "checked out". His feet feel normal today. No history of peripheral artery disease although does have a history of peripheral neuropathy. he reports he does take a water pill and has done so for some time. He reports compliance with this medication   Past Medical History:  Diagnosis Date  . Arthritis   . CAD (coronary artery disease)   . Congestive heart failure (CHF) (HCC) 06/14/2016   LVEF 40-45% 2016 echo; Dr. Darrold Junker  . COPD (chronic obstructive pulmonary disease) (HCC)   . Diabetes mellitus without complication (HCC)   . Family history of adverse reaction to anesthesia    mom seizures   . GERD (gastroesophageal reflux disease)   . History of blood clots in legs   . History of ischemic cardiomyopathy   . 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 Hyperlipidemia   . Hypertension   . Literacy level of illiterate   . Osteomyelitis (HCC)   . PN (peripheral neuropathy)     Patient Active Problem List   Diagnosis Date Noted  . Diabetic foot ulcers (HCC) 07/08/2016  . Congestive heart failure (CHF) (HCC) 06/14/2016  . Medication monitoring encounter 06/14/2016  . Hyperthyroidism 12/04/2015  . Unsteady gait 12/04/2015  . 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 Abnormal serum protein electrophoresis 12/05/2014  .  Pressure ulcer 11/15/2014  . Hyperproteinemia 11/02/2014  . Hypoalbuminemia 11/02/2014  . Arthritis 10/23/2014  . Arteriosclerosis of coronary artery 10/23/2014  . Diabetic foot ulcer associated with type 2 diabetes mellitus (HCC) 10/23/2014  . Amputation of finger of left hand 10/23/2014  . History of pulmonary embolism 10/23/2014  . Literacy problem 10/23/2014  . Umbilical hernia without obstruction or gangrene 10/23/2014  . Vitamin B12 deficiency 10/23/2014  . Neuropathy of lower extremity 10/23/2014  . History of osteomyelitis 10/23/2014  . Artificial cardiac pacemaker 08/10/2013  . H/O coronary artery bypass surgery 08/10/2013  . Benign prostatic hyperplasia with urinary obstruction 09/27/2012  . Abnormal prostate specific antigen 09/27/2012    Past Surgical History:  Procedure Laterality Date  . amputation 4th finger Left   . APPENDECTOMY    . CLAVICLE SURGERY  2012   open reduction and internal fixation of left clavicle  . CORONARY ARTERY BYPASS GRAFT  2002  . FOOT SURGERY    . IMPLANTABLE CARDIOVERTER DEFIBRILLATOR (ICD) GENERATOR CHANGE N/A 12/03/2015   Procedure: ICD GENERATOR CHANGE;  Surgeon: Marcina Millard, MD;  Location: [facility] ORS;  Service: Cardiovascular;  Laterality: N/A;  . PACEMAKER INSERTION      Prior to Admission medications   Medication Sig Start Date End Date Taking? Authorizing Provider  acetaminophen (TYLENOL) 500 MG tablet Take 500 mg by mouth every 8 (eight) hours as needed.    [provider]  Acidophilus Lactobacillus CAPS Take 1 capsule by mouth 2 (two) times daily.    [provider]  aspirin EC 81 MG tablet Take 1 tablet (81 mg total) by mouth daily. Patient not taking: Reported on 10/11/2016 05/11/16   Kerman Passey, MD  atorvastatin (LIPITOR) 40 MG tablet Take 1 tablet (40 mg total) by mouth at bedtime. 03/18/16   [name], Janit Bern, MD  busPIRone (BUSPAR) 5 MG tablet Take 5 mg by mouth 2 (two) times daily as needed.     [provider]  cholecalciferol (VITAMIN D) 1000 units tablet Take 1,000 Units by mouth 2 (two) times daily.    [provider]  furosemide (LASIX) 20 MG tablet Take 1 tablet (20 mg total) by mouth daily. 09/02/16   Kerman Passey, MD  gabapentin (NEURONTIN) 300 MG capsule Take 1 capsule (300 mg total) by mouth 3 (three) times daily. 02/02/16   Kerman Passey, MD  ibuprofen (ADVIL,MOTRIN) 200 MG tablet Take 200 mg by mouth every 6 (six) hours as needed.    [provider]  omeprazole (PRILOSEC) 40 MG capsule Take 1 capsule (40 mg total) by mouth daily. 12/11/15 12/10/16  Sharman Cheek, MD  potassium chloride (K-DUR) 10 MEQ tablet Take 1 tablet (10 mEq total) by mouth daily. 09/02/16   [name], Janit Bern, MD  Saw Palmetto, Serenoa repens, 1000 MG CAPS Take 1,200 mg by mouth daily.    [provider]  vitamin B-12 (CYANOCOBALAMIN) 500 MCG tablet Take 500 mcg by mouth daily.    [provider]     Allergies Oxycodone-acetaminophen and Percocet [oxycodone-acetaminophen]  Family History  Problem Relation Age of Onset  . Cancer Mother        spine  . Hypertension Mother   . Mental illness Mother   . Cancer Father        lung  . Asthma Father   . Allergic rhinitis Father   . Arthritis Father   . Tuberculosis Father   . Gout Brother   . Allergic rhinitis Brother     Social History Social History  Substance Use Topics  . Smoking status: Current Every Day Smoker    Packs/day: 0.25    Years: 10.00    Types: Cigarettes, Cigars    Last attempt to quit: 10/18/1979  . Smokeless tobacco: Never Used  . Alcohol use No    Review of Systems  Constitutional: No fever/chills Eyes: No visual changes.  ENT: No sore throat. Cardiovascular: Denies chest pain. Respiratory: Denies shortness of breath. Gastrointestinal: No abdominal pain.    Genitourinary: Negative for dysuria. Musculoskeletal: Negative for back pain.Edema as described above Skin:  Negative for rash. Neurological: Negative for headaches   ____________________________________________   PHYSICAL EXAM:  VITAL SIGNS: ED Triage Vitals  Enc Vitals Group     BP 10/12/16 1117 119/66     Pulse Rate 10/12/16 1117 66     Resp 10/12/16 1117 14     Temp 10/12/16 1117 97.8 F (36.6 C)     Temp Source 10/12/16 1117 Oral     SpO2 10/12/16 1115 97 %     Weight 10/12/16 1117 90.7 kg (200 lb)     Height 10/12/16 1117 1.803 m (5\' 11" )     Head Circumference --      [facility] Flow --      Pain Score 10/12/16 1115 2     Pain Loc --      Pain Edu? --      Excl. in GC? --     Constitutional: Alert and oriented. No acute distress. Pleasant and interactive  Eyes: Conjunctivae are normal.  Head: Atraumatic. Nose: No congestion/rhinnorhea. Mouth/Throat: Mucous membranes are moist.   Neck:  Painless ROM Cardiovascular: Normal rate, regular rhythm. Grossly normal heart sounds.  Good peripheral circulation. Respiratory: Normal respiratory effort.  No retractions. Lungs CTAB. Gastrointestinal: Soft and nontender. No distention.  No CVA tenderness. Genitourinary: deferred Musculoskeletal: Bilateral lower extremity edema 1-2+, warm and well perfused. 2+ DP pulses bilaterally Neurologic:  Normal speech and language. No gross focal neurologic deficits are appreciated.  Skin:  Skin is warm, dry and intact. No rash noted. Psychiatric: Mood and affect are normal. Speech and behavior are normal.  ____________________________________________   LABS (all labs ordered are listed, but only abnormal results are displayed)  Labs Reviewed - No data to display ____________________________________________  EKG  None ____________________________________________  RADIOLOGY None  ____________________________________________   PROCEDURES  Procedure(s) performed: No    Critical Care performed: No ____________________________________________   INITIAL IMPRESSION / ASSESSMENT AND  PLAN / ED COURSE  Pertinent labs & imaging results that were available during my care of the patient were reviewed by me and considered in my medical decision making (see chart for details).  Patient well-appearing and in no acute distress, chronic lower extremity edema, unchanged. Episode of "cold feet "last night however no pallor or evidence of decreased blood flow. I suspect neuropathy as the cause of his symptoms. No further workup necessary in the emergency department, recommended outpatient follow-up with his PCP. He agrees with this plan.    ____________________________________________   FINAL CLINICAL IMPRESSION(S) / ED DIAGNOSES  Final diagnoses:  Peripheral edema      NEW MEDICATIONS STARTED DURING THIS VISIT:  Discharge Medication List as of 10/12/2016 11:34 AM       Note:  This document was prepared using Dragon voice recognition software and may include unintentional dictation errors.    Jene Every, MD 10/12/16 1440

## 2016-10-12 NOTE — ED Notes (Signed)
Pt used walker to ambulate to lobby without difficulty. Pt infront of lobby phone and instructed to call family for a ride. Pt has the numbers to call but when RN attempted the first number it went to answering machine. Pt able to keep calling family in lobby.

## 2016-10-12 NOTE — Progress Notes (Signed)
Clinical Child psychotherapist (CSW) received a call from [facility] (APS) worker Randi [phone]. Per Randi patient has an open APS case and left [facility] and is now home. CSW made Randi aware that patient is being discharged home from the ED today.   [employer], LCSW [phone]

## 2016-10-12 NOTE — ED Triage Notes (Signed)
Pt to Ed reporting intermittent leg swelling for the past 4 months but stated pain was worse last night. Pt reports pain is either cramping in nature or burning but last night he reports his feet were "cold."   Pt reports increased SOB. No resp distress noted upon arrival. Pt also reports falling more recently due to increased dizziness. Pt unaware if he has vertigo. Pt denies Chest pain. Pt alert and oriented x 4.

## 2016-10-12 NOTE — ED Notes (Signed)
Pt reports having had intermittent leg swelling and pain for the past 4 months. Pt reports last night his right foot was "buring." Swelling noted on right foot. Pt had 2 scabs noted to the bottom of right foot. No ulcers noted. No bleeding or necrosis. Pt able to feel feet. Pulse intact.

## 2016-10-14 LAB — IFE AND PE, RANDOM URINE
% BETA, URINE: 0 %
ALPHA 1 URINE: 0 %
ALPHA 2 UR: 0 %
Albumin, U: 100 %
GAMMA GLOBULIN URINE: 0 %

## 2016-10-19 ENCOUNTER — Encounter: Payer: Self-pay | Admitting: Family Medicine

## 2016-10-19 ENCOUNTER — Ambulatory Visit (INDEPENDENT_AMBULATORY_CARE_PROVIDER_SITE_OTHER): Payer: Medicare Other | Admitting: Family Medicine

## 2016-10-19 VITALS — BP 138/56 | HR 90 | Temp 98.2°F | Resp 16 | Ht 71.0 in | Wt 201.0 lb

## 2016-10-19 DIAGNOSIS — R0989 Other specified symptoms and signs involving the circulatory and respiratory systems: Secondary | ICD-10-CM

## 2016-10-19 DIAGNOSIS — G5793 Unspecified mononeuropathy of bilateral lower limbs: Secondary | ICD-10-CM | POA: Diagnosis not present

## 2016-10-19 DIAGNOSIS — E11621 Type 2 diabetes mellitus with foot ulcer: Secondary | ICD-10-CM

## 2016-10-19 DIAGNOSIS — R600 Localized edema: Secondary | ICD-10-CM | POA: Diagnosis not present

## 2016-10-19 DIAGNOSIS — L97401 Non-pressure chronic ulcer of unspecified heel and midfoot limited to breakdown of skin: Secondary | ICD-10-CM | POA: Diagnosis not present

## 2016-10-19 NOTE — Progress Notes (Addendum)
Name: [name]   MRN: [id number]    DOB: 1934/05/26   Date:10/19/2016       Progress Note  Subjective  Chief Complaint  Chief Complaint  Patient presents with  . Follow-up    ER recheck   . Foot Pain    bilateral foot pain, swelling    HPI  Patient presents for ER follow up for bilateral foot pain and swelling.  Was seen in ER to follow up on Bilateral foot pain on 10/12/2016.  LEFT foot describes burning pain; and foot had scar from old wound on bottom of foot.  RIGHT foot pt describes aching pain; and has scab on bottom of foot that appears to be a healing diabetic wound.  Patient has chronic BLE swelling - takes Lasix daily.  Denies calf pain/redness, shortness of breath, or chest pain. Takes Gabapentin TID as prescribed. Tylenol helps a lot with the pain too.  Patient has a wound care nurse who checks his feet three times a week at home (MWF) - he states that she cannot always find a pulse on his foot, but most days she can.  Patient Active Problem List   Diagnosis Date Noted  . Bilateral lower extremity edema 10/19/2016  . Diabetic foot ulcers ([place]) 07/08/2016  . Congestive heart failure (CHF) ([place]) 06/14/2016  . Medication monitoring encounter 06/14/2016  . Hyperthyroidism 12/04/2015  . Unsteady gait 12/04/2015  . 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 Abnormal serum protein electrophoresis 12/05/2014  . Pressure ulcer 11/15/2014  . Hyperproteinemia 11/02/2014  . Hypoalbuminemia 11/02/2014  . Arthritis 10/23/2014  . Arteriosclerosis of coronary artery 10/23/2014  . Diabetic foot ulcer associated with type 2 diabetes mellitus ([place]) 10/23/2014  . Amputation of finger of left hand 10/23/2014  . History of pulmonary embolism 10/23/2014  . Literacy problem 10/23/2014  . Umbilical hernia without obstruction or gangrene 10/23/2014  . Vitamin B12  deficiency 10/23/2014  . Neuropathy of lower extremity 10/23/2014  . History of osteomyelitis 10/23/2014  . Artificial cardiac pacemaker 08/10/2013  . H/O coronary artery bypass surgery 08/10/2013  . Benign prostatic hyperplasia with urinary obstruction 09/27/2012  . Abnormal prostate specific antigen 09/27/2012    Social History  Substance Use Topics  . Smoking status: Current Every Day Smoker    Packs/day: 0.25    Years: 10.00    Types: Cigarettes, Cigars    Last attempt to quit: 10/18/1979  . Smokeless tobacco: Never Used  . Alcohol use No     Current Outpatient Prescriptions:  .  acetaminophen (TYLENOL) 500 MG tablet, Take 500 mg by mouth every 8 (eight) hours as needed., Disp: , Rfl:  .  Acidophilus Lactobacillus CAPS, Take 1 capsule by mouth 2 (two) times daily., Disp: , Rfl:  .  aspirin EC 81 MG tablet, Take 1 tablet (81 mg total) by mouth daily., Disp: 30 tablet, Rfl: 11 .  atorvastatin (LIPITOR) 40 MG tablet, Take 1 tablet (40 mg total) by mouth at bedtime., Disp: 90 tablet, Rfl: 1 .  busPIRone (BUSPAR) 5 MG tablet, Take 5 mg by mouth 2 (two) times daily as needed., Disp: , Rfl:  .  cholecalciferol (VITAMIN D) 1000 units tablet, Take 1,000 Units by mouth 2 (two) times daily., Disp: , Rfl:  .  furosemide (LASIX) 20 MG tablet, Take 1 tablet (20 mg total) by mouth daily., Disp: 30 tablet, Rfl: 0 .  gabapentin (NEURONTIN) 300 MG  capsule, Take 1 capsule (300 mg total) by mouth 3 (three) times daily., Disp: 90 capsule, Rfl: 5 .  ibuprofen (ADVIL,MOTRIN) 200 MG tablet, Take 200 mg by mouth every 6 (six) hours as needed., Disp: , Rfl:  .  omeprazole (PRILOSEC) 40 MG capsule, Take 1 capsule (40 mg total) by mouth daily., Disp: 30 capsule, Rfl: 1 .  potassium chloride (K-DUR) 10 MEQ tablet, Take 1 tablet (10 mEq total) by mouth daily., Disp: 30 tablet, Rfl: 0 .  Saw Palmetto, Serenoa repens, 1000 MG CAPS, Take 1,200 mg by mouth daily., Disp: , Rfl:  .  vitamin B-12 (CYANOCOBALAMIN) 500  MCG tablet, Take 500 mcg by mouth daily., Disp: , Rfl:   Allergies  Allergen Reactions  . Oxycodone-Acetaminophen Other (See Comments)    Causes the pt to be very irritable, says he can take this  . Percocet [Oxycodone-Acetaminophen] Other (See Comments)    Reaction:  Irritability     ROS  Ten systems reviewed and is negative except as mentioned in HPI   Objective  Vitals:   10/19/16 1045 10/19/16 1133  BP: (!) 104/50 (!) 138/56  Pulse: 90   Resp: 16   Temp: 98.2 F (36.8 C)   TempSrc: Oral   SpO2: 98%   Weight: 201 lb (91.2 kg)   Height: '5\' 11"'$  (1.803 m)    Body mass index is 28.03 kg/m.  Nursing Note and Vital Signs reviewed.  Physical Exam  Constitutional: Patient appears well-developed and well-nourished. Obese No distress.  HEENT: head atraumatic, normocephalic Cardiovascular: Normal rate, regular rhythm, S1/S2 present.  No murmur or rub heard. +2 pitting BLE edema. Pulmonary/Chest: Effort normal and breath sounds clear. No respiratory distress or retractions. Psychiatric: Patient has a normal mood and affect. behavior is normal. Judgment and thought content normal. Skin: No rash; scar to LEFT posterior pad of foot from diabetic ulcer; small scab on healing diabetic foot ulcer to RIGHT posterior pad of foot. Cap refill <3 seconds. Bilateral pedal pulses are very faint to palpation - skin is warm and dry and appropriate for ethnicity. Musculoskeletal: Normal range of motion, no joint effusions. No gross deformities. Neurological: he is alert and oriented to person, place, and time. No cranial nerve deficit. Coordination, balance, strength, speech and gait are normal.    Recent Results (from the past 2160 hour(s))  Basic metabolic panel     Status: Abnormal   Collection Time: 08/16/16  2:36 PM  Result Value Ref Range   Sodium 137 135 - 145 mmol/L   Potassium 3.9 3.5 - 5.1 mmol/L   Chloride 106 101 - 111 mmol/L   CO2 22 22 - 32 mmol/L   Glucose, Bld 102 (H) 65 -  99 mg/dL   BUN 12 6 - 20 mg/dL   Creatinine, Ser 1.24 0.61 - 1.24 mg/dL   Calcium 10.1 8.9 - 10.3 mg/dL   GFR calc non Af Amer 53 (L) >60 mL/min   GFR calc Af Amer >60 >60 mL/min    Comment: (NOTE) The eGFR has been calculated using the CKD EPI equation. This calculation has not been validated in all clinical situations. eGFR's persistently <60 mL/min signify possible Chronic Kidney Disease.    Anion gap 9 5 - 15  CBC     Status: None   Collection Time: 08/16/16  2:36 PM  Result Value Ref Range   WBC 7.1 3.8 - 10.6 K/uL   RBC 4.68 4.40 - 5.90 MIL/uL   Hemoglobin 15.2 13.0 - 18.0 g/dL  HCT 44.4 40.0 - 52.0 %   MCV 94.9 80.0 - 100.0 fL   MCH 32.6 26.0 - 34.0 pg   MCHC 34.3 32.0 - 36.0 g/dL   RDW 14.1 11.5 - 14.5 %   Platelets 185 150 - 440 K/uL  Troponin I     Status: None   Collection Time: 08/16/16  2:36 PM  Result Value Ref Range   Troponin I <0.03 <0.03 ng/mL  Urinalysis, Complete w Microscopic     Status: Abnormal   Collection Time: 08/16/16  5:08 PM  Result Value Ref Range   Color, Urine YELLOW (A) YELLOW   APPearance CLEAR (A) CLEAR   Specific Gravity, Urine 1.013 1.005 - 1.030   pH 6.0 5.0 - 8.0   Glucose, UA NEGATIVE NEGATIVE mg/dL   Hgb urine dipstick NEGATIVE NEGATIVE   Bilirubin Urine NEGATIVE NEGATIVE   Ketones, ur NEGATIVE NEGATIVE mg/dL   Protein, ur 30 (A) NEGATIVE mg/dL   Nitrite NEGATIVE NEGATIVE   Leukocytes, UA NEGATIVE NEGATIVE   RBC / HPF NONE SEEN 0 - 5 RBC/hpf   WBC, UA 0-5 0 - 5 WBC/hpf   Bacteria, UA NONE SEEN NONE SEEN   Squamous Epithelial / LPF 0-5 (A) NONE SEEN  Basic metabolic panel     Status: Abnormal   Collection Time: 08/26/16  4:00 PM  Result Value Ref Range   Sodium 138 135 - 145 mmol/L   Potassium 4.1 3.5 - 5.1 mmol/L   Chloride 106 101 - 111 mmol/L   CO2 22 22 - 32 mmol/L   Glucose, Bld 106 (H) 65 - 99 mg/dL   BUN 14 6 - 20 mg/dL   Creatinine, Ser 1.16 0.61 - 1.24 mg/dL   Calcium 10.3 8.9 - 10.3 mg/dL   GFR calc non Af  Amer 58 (L) >60 mL/min   GFR calc Af Amer >60 >60 mL/min    Comment: (NOTE) The eGFR has been calculated using the CKD EPI equation. This calculation has not been validated in all clinical situations. eGFR's persistently <60 mL/min signify possible Chronic Kidney Disease.    Anion gap 10 5 - 15  CBC     Status: None   Collection Time: 08/26/16  4:00 PM  Result Value Ref Range   WBC 6.6 3.8 - 10.6 K/uL   RBC 4.60 4.40 - 5.90 MIL/uL   Hemoglobin 14.6 13.0 - 18.0 g/dL   HCT 43.0 40.0 - 52.0 %   MCV 93.4 80.0 - 100.0 fL   MCH 31.7 26.0 - 34.0 pg   MCHC 33.9 32.0 - 36.0 g/dL   RDW 13.9 11.5 - 14.5 %   Platelets 221 150 - 440 K/uL  Troponin I     Status: None   Collection Time: 08/26/16  4:00 PM  Result Value Ref Range   Troponin I <0.03 <0.03 ng/mL  Urinalysis, Complete w Microscopic     Status: Abnormal   Collection Time: 08/26/16  6:00 PM  Result Value Ref Range   Color, Urine YELLOW (A) YELLOW   APPearance CLEAR (A) CLEAR   Specific Gravity, Urine 1.010 1.005 - 1.030   pH 5.0 5.0 - 8.0   Glucose, UA NEGATIVE NEGATIVE mg/dL   Hgb urine dipstick NEGATIVE NEGATIVE   Bilirubin Urine NEGATIVE NEGATIVE   Ketones, ur NEGATIVE NEGATIVE mg/dL   Protein, ur NEGATIVE NEGATIVE mg/dL   Nitrite NEGATIVE NEGATIVE   Leukocytes, UA NEGATIVE NEGATIVE   RBC / HPF NONE SEEN 0 - 5 RBC/hpf   WBC,  UA NONE SEEN 0 - 5 WBC/hpf   Bacteria, UA NONE SEEN NONE SEEN   Squamous Epithelial / LPF NONE SEEN NONE SEEN   Mucus PRESENT    Hyaline Casts, UA PRESENT   Glucose, capillary     Status: Abnormal   Collection Time: 08/27/16  3:55 AM  Result Value Ref Range   Glucose-Capillary 109 (H) 65 - 99 mg/dL  Basic metabolic panel     Status: Abnormal   Collection Time: 09/07/16  1:59 PM  Result Value Ref Range   Sodium 136 135 - 145 mmol/L   Potassium 3.6 3.5 - 5.1 mmol/L   Chloride 107 101 - 111 mmol/L   CO2 22 22 - 32 mmol/L   Glucose, Bld 240 (H) 65 - 99 mg/dL   BUN 15 6 - 20 mg/dL   Creatinine,  Ser 1.58 (H) 0.61 - 1.24 mg/dL   Calcium 9.8 8.9 - 10.3 mg/dL   GFR calc non Af Amer 40 (L) >60 mL/min   GFR calc Af Amer 46 (L) >60 mL/min    Comment: (NOTE) The eGFR has been calculated using the CKD EPI equation. This calculation has not been validated in all clinical situations. eGFR's persistently <60 mL/min signify possible Chronic Kidney Disease.    Anion gap 7 5 - 15  CBC     Status: Abnormal   Collection Time: 09/07/16  1:59 PM  Result Value Ref Range   WBC 7.4 3.8 - 10.6 K/uL   RBC 4.36 (L) 4.40 - 5.90 MIL/uL   Hemoglobin 14.1 13.0 - 18.0 g/dL   HCT 41.0 40.0 - 52.0 %   MCV 94.2 80.0 - 100.0 fL   MCH 32.5 26.0 - 34.0 pg   MCHC 34.5 32.0 - 36.0 g/dL   RDW 13.8 11.5 - 14.5 %   Platelets 187 150 - 440 K/uL  CBC     Status: None   Collection Time: 09/09/16  4:42 AM  Result Value Ref Range   WBC 6.1 3.8 - 10.6 K/uL   RBC 4.95 4.40 - 5.90 MIL/uL   Hemoglobin 16.0 13.0 - 18.0 g/dL   HCT 47.0 40.0 - 52.0 %   MCV 94.9 80.0 - 100.0 fL   MCH 32.3 26.0 - 34.0 pg   MCHC 34.0 32.0 - 36.0 g/dL   RDW 13.5 11.5 - 14.5 %   Platelets 216 150 - 440 K/uL  Basic metabolic panel     Status: Abnormal   Collection Time: 09/09/16  4:42 AM  Result Value Ref Range   Sodium 140 135 - 145 mmol/L   Potassium 4.0 3.5 - 5.1 mmol/L    Comment: HEMOLYSIS AT THIS LEVEL MAY AFFECT RESULT   Chloride 105 101 - 111 mmol/L   CO2 26 22 - 32 mmol/L   Glucose, Bld 85 65 - 99 mg/dL   BUN 12 6 - 20 mg/dL   Creatinine, Ser 1.30 (H) 0.61 - 1.24 mg/dL   Calcium 9.9 8.9 - 10.3 mg/dL   GFR calc non Af Amer 50 (L) >60 mL/min   GFR calc Af Amer 58 (L) >60 mL/min    Comment: (NOTE) The eGFR has been calculated using the CKD EPI equation. This calculation has not been validated in all clinical situations. eGFR's persistently <60 mL/min signify possible Chronic Kidney Disease.    Anion gap 9 5 - 15  Troponin I     Status: None   Collection Time: 09/09/16  4:42 AM  Result Value Ref Range  Troponin I  <0.03 <0.03 ng/mL  CBC with Differential     Status: Abnormal   Collection Time: 10/11/16 11:27 AM  Result Value Ref Range   WBC 6.1 3.8 - 10.6 K/uL   RBC 4.37 (L) 4.40 - 5.90 MIL/uL   Hemoglobin 14.1 13.0 - 18.0 g/dL   HCT 40.3 40.0 - 52.0 %   MCV 92.1 80.0 - 100.0 fL   MCH 32.1 26.0 - 34.0 pg   MCHC 34.9 32.0 - 36.0 g/dL   RDW 13.9 11.5 - 14.5 %   Platelets 186 150 - 440 K/uL   Neutrophils Relative % 62 %   Neutro Abs 3.8 1.4 - 6.5 K/uL   Lymphocytes Relative 22 %   Lymphs Abs 1.3 1.0 - 3.6 K/uL   Monocytes Relative 12 %   Monocytes Absolute 0.7 0.2 - 1.0 K/uL   Eosinophils Relative 3 %   Eosinophils Absolute 0.2 0 - 0.7 K/uL   Basophils Relative 1 %   Basophils Absolute 0.0 0 - 0.1 K/uL  Comprehensive metabolic panel     Status: Abnormal   Collection Time: 10/11/16 11:27 AM  Result Value Ref Range   Sodium 137 135 - 145 mmol/L   Potassium 3.5 3.5 - 5.1 mmol/L   Chloride 105 101 - 111 mmol/L   CO2 27 22 - 32 mmol/L   Glucose, Bld 104 (H) 65 - 99 mg/dL   BUN 11 6 - 20 mg/dL   Creatinine, Ser 1.19 0.61 - 1.24 mg/dL   Calcium 9.6 8.9 - 10.3 mg/dL   Total Protein 8.0 6.5 - 8.1 g/dL   Albumin 4.0 3.5 - 5.0 g/dL   AST 32 15 - 41 U/L   ALT 21 17 - 63 U/L   Alkaline Phosphatase 89 38 - 126 U/L   Total Bilirubin 0.7 0.3 - 1.2 mg/dL   GFR calc non Af Amer 56 (L) >60 mL/min   GFR calc Af Amer >60 >60 mL/min    Comment: (NOTE) The eGFR has been calculated using the CKD EPI equation. This calculation has not been validated in all clinical situations. eGFR's persistently <60 mL/min signify possible Chronic Kidney Disease.    Anion gap 5 5 - 15  Lactate dehydrogenase     Status: None   Collection Time: 10/11/16 11:27 AM  Result Value Ref Range   LDH 162 98 - 192 U/L  Multiple Myeloma Panel (SPEP&IFE w/QIG)     Status: Abnormal   Collection Time: 10/11/16 11:28 AM  Result Value Ref Range   IgG (Immunoglobin G), Serum 1,762 (H) 700 - 1,600 mg/dL   IgA 730 (H) 61 - 437 mg/dL    IgM (Immunoglobulin M), Srm 98 15 - 143 mg/dL   Total Protein ELP 7.7 6.0 - 8.5 g/dL   Albumin SerPl Elph-Mcnc 3.7 2.9 - 4.4 g/dL   Alpha 1 0.2 0.0 - 0.4 g/dL   Alpha2 Glob SerPl Elph-Mcnc 0.5 0.4 - 1.0 g/dL   B-Globulin SerPl Elph-Mcnc 1.5 (H) 0.7 - 1.3 g/dL   Gamma Glob SerPl Elph-Mcnc 1.7 0.4 - 1.8 g/dL   M Protein SerPl Elph-Mcnc Not Observed Not Observed g/dL   Globulin, Total 4.0 (H) 2.2 - 3.9 g/dL   Albumin/Glob SerPl 1.0 0.7 - 1.7   IFE 1 Comment     Comment: (NOTE) An apparent polyclonal gammopathy: IgG and IgA. Kappa and lambda typing appear increased.    Please Note Comment     Comment: (NOTE) Protein electrophoresis scan will follow via computer,  mail, or courier delivery. Performed At: [facility] [place], [place] [id number] Lindon Romp MD PP:[id number]   Vitamin B12     Status: None   Collection Time: 10/11/16 11:28 AM  Result Value Ref Range   Vitamin B-12 663 180 - 914 pg/mL    Comment: (NOTE) This assay is not validated for testing neonatal or myeloproliferative syndrome specimens for Vitamin B12 levels. Performed at [facility], [place] [address], [place], [place] [address]   IFE and PE, Random Urine     Status: None   Collection Time: 10/11/16 11:30 AM  Result Value Ref Range   Total Protein, Urine <4.0 Not Estab. mg/dL   Albumin, U 100.0 %   ALPHA 1 URINE 0.0 %   Alpha 2, Urine 0.0 %   % BETA, Urine 0.0 %   GAMMA GLOBULIN URINE 0.0 %   M-SPIKE %, Urine Not Observed Not Observed %   Immunofixation Result, Urine Comment     Comment: An apparent normal immunofixation pattern.   Note: Comment     Comment: (NOTE) Protein electrophoresis scan will follow via computer, mail, or courier delivery. Performed At: [facility] [place], [place] [id number] Lindon Romp MD EX:[id number]      Assessment & Plan  1. Diminished pulses in lower extremity - Ambulatory referral to Vascular  Surgery  2. Bilateral lower extremity edema - Ambulatory referral to Vascular Surgery - Continue Lasix  3. Neuropathy involving both lower extremities - Ambulatory referral to Vascular Surgery - Continue Gabapentin  4. Diabetic ulcer of midfoot associated with type 2 diabetes mellitus, limited to breakdown of skin, unspecified laterality ([place]) Stable, appear to be in later stages of healing Continue home health wound care visits.  - Pt's BP is on the low side of normal today, so we will hold on increasing Lasix dosing today.  -Red flags and when to present for emergency care or RTC including fever >101.71F, chest pain, shortness of breath, new/worsening/un-resolving symptoms, reviewed with patient at time of visit. Follow up and care instructions discussed and provided in AVS.  I have reviewed this encounter including the documentation in this note and/or discussed this patient with the Johney Maine, FNP, NP-C. I am certifying that I agree with the content of this note as supervising physician.  Steele Sizer, MD [facility] 10/19/2016, 1:18 PM

## 2016-10-20 ENCOUNTER — Other Ambulatory Visit: Payer: Self-pay | Admitting: *Deleted

## 2016-10-20 ENCOUNTER — Encounter: Payer: Self-pay | Admitting: *Deleted

## 2016-10-20 DIAGNOSIS — E0800 Diabetes mellitus due to underlying condition with hyperosmolarity without nonketotic hyperglycemic-hyperosmolar coma (NKHHC): Secondary | ICD-10-CM

## 2016-10-20 DIAGNOSIS — I5022 Chronic systolic (congestive) heart failure: Secondary | ICD-10-CM

## 2016-10-20 NOTE — Patient Outreach (Addendum)
[facility] Care Management  10/20/2016  [name] [birth year] [id number]  Referral Date :  [id number] [facility] Source[facility]  [facility] Ref[facility] Reason:  6 or more visits in the past 6 months [facility]   [facility]  RN Health Coach telephone call to patient.  [employer] compliance verified. Patient has history of CHF, DM and hyperthyroidism. Per patient he does not check his blood sugars. He doesn't think that is right and people shouldn't be sticking their fingers every day. He only gets his checked at the Dr office. A1C is 5.8 on 04/30/2016.  Patient stated he is unable to afford some of his medications and he would like for the pharmacist to help him set up a pill box. He likes the pharmacy where he gets his medication because he can walk there. He would like to talk with a Child psychotherapistsocial worker because his truck is broke down and he has to depend on his grandson to take him to the Dr.Sometimes he doesn't have a way because his grandson has to work. Patient has finished the eighth grade, but is very limited in reading ability. He states he is good at counting.  Patient has history of 2 falls within the past 3 months. He has bilateral lower extremity edema. He has a wound care nurse that is coming three times a week. Patient has agreed to pharmacy and community nurse and social worker to come to his home.  Plan:  Referral to community RN Referral to pharmacy Referral to Social worker  [name] BSN RN [facility] Care Management [phone]

## 2016-10-21 ENCOUNTER — Encounter: Payer: Self-pay | Admitting: Emergency Medicine

## 2016-10-21 ENCOUNTER — Emergency Department
Admission: EM | Admit: 2016-10-21 | Discharge: 2016-10-21 | Disposition: A | Discharge status: Home / Self Care | Payer: Medicare Other | Attending: Emergency Medicine | Admitting: Emergency Medicine

## 2016-10-21 DIAGNOSIS — J449 Chronic obstructive pulmonary disease, unspecified: Secondary | ICD-10-CM | POA: Insufficient documentation

## 2016-10-21 DIAGNOSIS — I2510 Atherosclerotic heart disease of native coronary artery without angina pectoris: Secondary | ICD-10-CM | POA: Diagnosis not present

## 2016-10-21 DIAGNOSIS — F17210 Nicotine dependence, cigarettes, uncomplicated: Secondary | ICD-10-CM | POA: Insufficient documentation

## 2016-10-21 DIAGNOSIS — I110 Hypertensive heart disease with heart failure: Secondary | ICD-10-CM | POA: Diagnosis not present

## 2016-10-21 DIAGNOSIS — R42 Dizziness and giddiness: Secondary | ICD-10-CM | POA: Diagnosis present

## 2016-10-21 DIAGNOSIS — E119 Type 2 diabetes mellitus without complications: Secondary | ICD-10-CM | POA: Diagnosis not present

## 2016-10-21 DIAGNOSIS — Z950 Presence of cardiac pacemaker: Secondary | ICD-10-CM | POA: Insufficient documentation

## 2016-10-21 DIAGNOSIS — F17290 Nicotine dependence, other tobacco product, uncomplicated: Secondary | ICD-10-CM | POA: Insufficient documentation

## 2016-10-21 DIAGNOSIS — I509 Heart failure, unspecified: Secondary | ICD-10-CM | POA: Diagnosis not present

## 2016-10-21 DIAGNOSIS — I951 Orthostatic hypotension: Secondary | ICD-10-CM | POA: Insufficient documentation

## 2016-10-21 LAB — CBC
HCT: 40.8 % (ref 40.0–52.0)
HEMOGLOBIN: 14 g/dL (ref 13.0–18.0)
MCH: 32.4 pg (ref 26.0–34.0)
MCHC: 34.4 g/dL (ref 32.0–36.0)
MCV: 94.2 fL (ref 80.0–100.0)
PLATELETS: 178 10*3/uL (ref 150–440)
RBC: 4.33 MIL/uL — AB (ref 4.40–5.90)
RDW: 14.1 % (ref 11.5–14.5)
WBC: 5.3 10*3/uL (ref 3.8–10.6)

## 2016-10-21 LAB — BASIC METABOLIC PANEL
ANION GAP: 8 (ref 5–15)
BUN: 10 mg/dL (ref 6–20)
CALCIUM: 9.1 mg/dL (ref 8.9–10.3)
CHLORIDE: 109 mmol/L (ref 101–111)
CO2: 24 mmol/L (ref 22–32)
Creatinine, Ser: 1.14 mg/dL (ref 0.61–1.24)
GFR calc non Af Amer: 59 mL/min — ABNORMAL LOW (ref >60)
Glucose, Bld: 92 mg/dL (ref 65–99)
Potassium: 3.9 mmol/L (ref 3.5–5.1)
SODIUM: 141 mmol/L (ref 135–145)

## 2016-10-21 LAB — TROPONIN I

## 2016-10-21 LAB — URINALYSIS, COMPLETE (UACMP) WITH MICROSCOPIC
Bacteria, UA: NONE SEEN
Bilirubin Urine: NEGATIVE
GLUCOSE, UA: NEGATIVE mg/dL
HGB URINE DIPSTICK: NEGATIVE
KETONES UR: NEGATIVE mg/dL
LEUKOCYTES UA: NEGATIVE
Nitrite: NEGATIVE
PH: 7 (ref 5.0–8.0)
PROTEIN: NEGATIVE mg/dL
RBC / HPF: NONE SEEN RBC/hpf (ref 0–5)
Specific Gravity, Urine: 1.009 (ref 1.005–1.030)
Squamous Epithelial / LPF: NONE SEEN
WBC UA: NONE SEEN WBC/hpf (ref 0–5)

## 2016-10-21 MED ORDER — SODIUM CHLORIDE 0.9 % IV BOLUS (SEPSIS)
1000.0000 mL | Freq: Once | INTRAVENOUS | Status: AC
  Administered 2016-10-21: 1000 mL via INTRAVENOUS

## 2016-10-21 MED ORDER — GABAPENTIN 300 MG PO CAPS
300.0000 mg | ORAL_CAPSULE | Freq: Once | ORAL | Status: AC
  Administered 2016-10-21: 300 mg via ORAL
  Filled 2016-10-21: qty 1

## 2016-10-21 NOTE — ED Provider Notes (Addendum)
[facility] Emergency Department Provider Note  ____________________________________________  Time seen: Approximately 2:54 PM  I have reviewed the triage vital signs and the nursing notes.   HISTORY  Chief Complaint Dizziness  Caveat that the patient is a poor historian.  HPI [name] is a 82 y.o. male who lives at home with a history of CAD, CHF, ischemic cardiomyopathy, DVT, COPD, DMas well as a history of severe neuropathy of the feet, unsteady gait, and frequent falls, presenting with lightheadedness. The patient reports that he chronically feels lightheaded with standing. He says this has been going on for decades. He states that several times per year, he has lightheadedness that is so severe he is unable to get out of bed. Today, the patient woke up and when he tried to stand up to fix himself breakfast at 7 AM, he was unable to do so because of lightheadedness. He denies any associated headache, visual changes, speech changes, chest pain, palpitations, syncope. He has not had any recent changes in his medications, nor has he had difficulty with eating and drinking. He denies any nausea vomiting or diarrhea, abdominal pain, dysuria. Today's episode was not different than his prior episodes, but he came because he is really hungry and was unable to get fix himself some food.   Past Medical History:  Diagnosis Date  . Arthritis   . CAD (coronary artery disease)   . Congestive heart failure (CHF) (HCC) 06/14/2016   LVEF 40-45% 2016 echo; Dr. Darrold Junker  . COPD (chronic obstructive pulmonary disease) (HCC)   . Diabetes mellitus without complication (HCC)   . Family history of adverse reaction to anesthesia    mom seizures   . GERD (gastroesophageal reflux disease)   . History of blood clots in legs   . History of ischemic cardiomyopathy   . 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 Hyperlipidemia   . Hypertension   . Literacy level of illiterate   .  Osteomyelitis (HCC)   . PN (peripheral neuropathy)     Patient Active Problem List   Diagnosis Date Noted  . Bilateral lower extremity edema 10/19/2016  . Diabetic foot ulcers (HCC) 07/08/2016  . Congestive heart failure (CHF) (HCC) 06/14/2016  . Medication monitoring encounter 06/14/2016  . Hyperthyroidism 12/04/2015  . Unsteady gait 12/04/2015  . 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 Abnormal serum protein electrophoresis 12/05/2014  . Pressure ulcer 11/15/2014  . Hyperproteinemia 11/02/2014  . Hypoalbuminemia 11/02/2014  . Arthritis 10/23/2014  . Arteriosclerosis of coronary artery 10/23/2014  . Diabetic foot ulcer associated with type 2 diabetes mellitus (HCC) 10/23/2014  . Amputation of finger of left hand 10/23/2014  . History of pulmonary embolism 10/23/2014  . Literacy problem 10/23/2014  . Umbilical hernia without obstruction or gangrene 10/23/2014  . Vitamin B12 deficiency 10/23/2014  . Neuropathy of lower extremity 10/23/2014  . History of osteomyelitis 10/23/2014  . Artificial cardiac pacemaker 08/10/2013  . H/O coronary artery bypass surgery 08/10/2013  . Benign prostatic hyperplasia with urinary obstruction 09/27/2012  . Abnormal prostate specific antigen 09/27/2012    Past Surgical History:  Procedure Laterality Date  . amputation 4th finger Left   . APPENDECTOMY    . CLAVICLE SURGERY  2012   open reduction and internal fixation of left clavicle  . CORONARY ARTERY BYPASS GRAFT  2002  . FOOT SURGERY    . IMPLANTABLE CARDIOVERTER DEFIBRILLATOR (ICD) GENERATOR CHANGE N/A 12/03/2015  Procedure: ICD GENERATOR CHANGE;  Surgeon: Marcina Millard, MD;  Location: [facility] ORS;  Service: Cardiovascular;  Laterality: N/A;  . PACEMAKER INSERTION      Current Outpatient Rx  . Order #: [id number] Class: Historical Med  . Order #: [id number] Class:  Historical Med  . Order #: [id number] Class: Normal  . Order #: [id number] Class: Normal  . Order #: [id number] Class: Historical Med  . Order #: [id number] Class: Historical Med  . Order #: [id number] Class: Normal  . Order #: [id number] Class: Normal  . Order #: [id number] Class: Historical Med  . Order #: [id number] Class: Print  . Order #: [id number] Class: Normal  . Order #: [id number] Class: Historical Med  . Order #: [id number] Class: Historical Med    Allergies Oxycodone-acetaminophen and Percocet [oxycodone-acetaminophen]  Family History  Problem Relation Age of Onset  . Cancer Mother        spine  . Hypertension Mother   . Mental illness Mother   . Cancer Father        lung  . Asthma Father   . Allergic rhinitis Father   . Arthritis Father   . Tuberculosis Father   . Gout Brother   . Allergic rhinitis Brother     Social History Social History  Substance Use Topics  . Smoking status: Current Every Day Smoker    Packs/day: 0.25    Years: 10.00    Types: Cigarettes, Cigars    Last attempt to quit: 10/18/1979  . Smokeless tobacco: Never Used  . Alcohol use No    Review of Systems Constitutional: No fever/chills.Positive lightheadedness. No dizziness. No syncope. No generalized malaise. Eyes: No visual changes. No blurred or double vision. ENT: No sore throat. No congestion or rhinorrhea. Cardiovascular: Denies chest pain. Denies palpitations. Respiratory: Denies shortness of breath.  No cough. Gastrointestinal: No abdominal pain.  No nausea, no vomiting.  No diarrhea.  No constipation. Genitourinary: Negative for dysuria. Musculoskeletal: Negative for back pain. Skin: Negative for rash. Neurological: Negative for headaches. No focal numbness, tingling or weakness. Positive difficulty walking due to lightheadedness but no true gait instability above baseline. 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    ____________________________________________   PHYSICAL  EXAM:  VITAL SIGNS: ED Triage Vitals  Enc Vitals Group     BP 10/21/16 1340 119/62     Pulse Rate 10/21/16 1340 61     Resp --      Temp 10/21/16 1340 97.8 F (36.6 C)     Temp Source 10/21/16 1340 Oral     SpO2 10/21/16 1340 100 %     Weight 10/21/16 1341 201 lb (91.2 kg)     Height 10/21/16 1341  (1.803 m)     Head Circumference --      Peak Flow --      Pain Score 10/21/16 1336 5     Pain Loc --      Pain Edu? --      Excl. in GC? --     Constitutional: Alert and oriented. Chronically ill appearing and in no acute distress. Answers questions appropriately. Eyes: Conjunctivae are normal.  EOMI. PERRLA. No scleral icterus. Head: Atraumatic. No Battle sign. Nose: No congestion/rhinnorhea. Mouth/Throat: Mucous membranes are moist.  Neck: No stridor.  Supple.  No JVD. No meningismus. Cardiovascular: Normal rate, regular rhythm. No murmurs, rubs or gallops.  Respiratory: Normal respiratory effort.  No accessory muscle use or retractions. Lungs CTAB.  No wheezes, rales or ronchi. Gastrointestinal: Soft, nontender and nondistended.  No guarding or rebound.  No peritoneal  signs. Musculoskeletal: Bilateral chronic appearing lower extremity edema to the mid tibial shaft that is symmetric bilaterally. Neurologic: Alert with clear speech. Naming and repetition are intact. Face and smile symmetric. Tongue is midline. EOMI. PERRLA. No horizontal or vertical nystagmus. No pronator drift. 5 out of 5 grip, biceps, triceps, hip flexors, plantar flexion and dorsiflexion. Normal sensation to light touch in the bilateral upper extremities, and face. Decreased sensation diffusely in the lower extremities. Normal finger-nose-finger. Skin:  Skin is warm, dry and intact. No rash noted. Psychiatric: Mood and affect are normal.   ____________________________________________   LABS (all labs ordered are listed, but only abnormal results are displayed)  Labs Reviewed  BASIC METABOLIC PANEL -  Abnormal; Notable for the following:       Result Value   GFR calc non Af Amer 59 (*)    All other components within normal limits  CBC - Abnormal; Notable for the following:    RBC 4.33 (*)    All other components within normal limits  URINALYSIS, COMPLETE (UACMP) WITH MICROSCOPIC  TROPONIN I  CBG MONITORING, ED   ____________________________________________  EKG  ED ECG REPORT I, Rockne MenghiniNorman, Anne-Caroline, the attending physician, personally viewed and interpreted this ECG.   Date: 10/21/2016  EKG Time: 1339  Rate: 60  Rhythm: AICD  Axis: normal  Intervals:AICD  ST&T Change: AICD  ____________________________________________  RADIOLOGY  No results found.  ____________________________________________   PROCEDURES  Procedure(s) performed: None  Procedures  Critical Care performed: No ____________________________________________   INITIAL IMPRESSION / ASSESSMENT AND PLAN / ED COURSE  Pertinent labs & imaging results that were available during my care of the patient were reviewed by me and considered in my medical decision making (see chart for details).  82 y.o. male with a history of recurrent lightheadedness presenting for lightheadedness. Overall, the patient is a poor historian, but does not seem to describe any symptoms that are new or out of character for him. Per report, EMS found the patient to be orthostatic and I will recheck this. On my examination, he is not particularly dehydrated appearance and has moist mucous membranes. However, he does have advanced cardiomyopathy and does take diuretics, which may contribute to some hypovolemia. I will treat him with a liter intravenous fluid, and check basic blood work as well as a urinalysis to rule out other causes for lightheadedness. Plan reevaluation for final disposition.  ----------------------------------------- 4:53 PM on 10/21/2016 -----------------------------------------  The patient's workup in the  emergency department has been reassuring. He came here today with acute on chronic lightheadedness and I do not see any emergency causes for his symptoms. His EKG does not show arrhythmia or ischemia. History is negative. He is not orthostatic in the emergency department, nor does he have signs of significant dehydration here. His laboratory studies are also reassuring. At this time, the patient is stable for discharge and will follow-up with his primary care physician. Follow-up instructions as well as return precautions were discussed.  ____________________________________________  FINAL CLINICAL IMPRESSION(S) / ED DIAGNOSES  Final diagnoses:  Lightheadedness  Orthostasis         NEW MEDICATIONS STARTED DURING THIS VISIT:  New Prescriptions   No medications on file      Rockne MenghiniNorman, Anne-Caroline, MD 10/21/16 1502    Rockne MenghiniNorman, Anne-Caroline, MD 10/21/16 [phone]

## 2016-10-21 NOTE — ED Notes (Signed)
Dr Sharma Covertnorman notified that pt refused cbg

## 2016-10-21 NOTE — ED Triage Notes (Signed)
Pt ems from home for dizziness when standing. Pt unsure how long symptoms have occurred. Pt found to be orthostatic by EMS.  Pt also c/o chronic bilateral foot pain.

## 2016-10-21 NOTE — Discharge Instructions (Signed)
Please make an appointment with your regular doctor to be reevaluated for the lightheadedness that you often experience.  Return to the emergency department if you develop worsening lightheadedness, fainting, fever, changes in vision or speech, confusion, numbness tingling or weakness, or any other symptoms concerning to you.

## 2016-10-21 NOTE — ED Notes (Signed)
Gave patient new socks.

## 2016-10-21 NOTE — ED Notes (Signed)
Pt discharged to lobby with cab voucher.

## 2016-10-22 ENCOUNTER — Other Ambulatory Visit: Payer: Self-pay | Admitting: *Deleted

## 2016-10-22 ENCOUNTER — Other Ambulatory Visit: Payer: Self-pay | Admitting: Pharmacist
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
## 2016-10-22 NOTE — Patient Outreach (Signed)
[facility] Care Management  10/22/2016  Delane Gingerrthur E Banh 1934/02/18 [id number]   82 year old male [facility] Gean MaidensFrances Pleasant.  [facility] requested for medication review and assistance with a pillbox.  PMHx includes, but not limited to, type 2 diabetes with diabetic foot ulcers and neuropathy, CAD s/p CABG, CHF (EF 40-45% 9/'16), ischemic cardiomyopathy, chronic BLE swelling, COPD, PE, illiteracy, and chronic dizziness.  Noted multiple recent ED visits for dizziness, leg swelling, and falls.  Patient recently seen by FNP Maurice SmallEmily Boyce on 10/19/2016 for bilateral foot pain and swelling and referred to outpatient vascular surgery.    Successful outpatient phone-call to Mr. Macha today. HIPAA identifiers verified. Patient reports he does not need or want a pillbox for his medications.  He reports he knows how to take his medications and has his own system in place that works.  Patient is agreeable for me to review his medications and what they are used for at a home visit.   Plan: Home visit next Thursday, 9/13 [facility] Jodi Mourningose Pierzchala at 2:00 PM.   Haynes Hoehnolleen Chaquita Basques, PharmD, [facility] Clinical Pharmacist [facility] [phone]

## 2016-10-25 ENCOUNTER — Emergency Department: Payer: Medicare Other

## 2016-10-25 ENCOUNTER — Inpatient Hospital Stay: Payer: Medicare Other

## 2016-10-25 ENCOUNTER — Encounter: Payer: Self-pay | Admitting: Licensed Clinical Social Worker

## 2016-10-25 ENCOUNTER — Inpatient Hospital Stay: Payer: Medicare Other | Admitting: Oncology

## 2016-10-25 ENCOUNTER — Observation Stay
Admission: EM | Admit: 2016-10-25 | Discharge: 2016-10-27 | Disposition: A | Discharge status: Home / Self Care | Payer: Medicare Other | Attending: Internal Medicine | Admitting: Internal Medicine

## 2016-10-25 ENCOUNTER — Other Ambulatory Visit: Payer: Self-pay | Admitting: Licensed Clinical Social Worker

## 2016-10-25 DIAGNOSIS — Z95810 Presence of automatic (implantable) cardiac defibrillator: Secondary | ICD-10-CM | POA: Diagnosis not present

## 2016-10-25 DIAGNOSIS — I214 Non-ST elevation (NSTEMI) myocardial infarction: Secondary | ICD-10-CM

## 2016-10-25 DIAGNOSIS — Z86718 Personal history of other venous thrombosis and embolism: Secondary | ICD-10-CM | POA: Diagnosis not present

## 2016-10-25 DIAGNOSIS — I110 Hypertensive heart disease with heart failure: Secondary | ICD-10-CM | POA: Diagnosis not present

## 2016-10-25 DIAGNOSIS — R270 Ataxia, unspecified: Secondary | ICD-10-CM | POA: Insufficient documentation

## 2016-10-25 DIAGNOSIS — I255 Ischemic cardiomyopathy: Secondary | ICD-10-CM | POA: Insufficient documentation

## 2016-10-25 DIAGNOSIS — R2681 Unsteadiness on feet: Secondary | ICD-10-CM | POA: Diagnosis present

## 2016-10-25 DIAGNOSIS — I509 Heart failure, unspecified: Secondary | ICD-10-CM | POA: Diagnosis not present

## 2016-10-25 DIAGNOSIS — K219 Gastro-esophageal reflux disease without esophagitis: Secondary | ICD-10-CM | POA: Insufficient documentation

## 2016-10-25 DIAGNOSIS — R7989 Other specified abnormal findings of blood chemistry: Secondary | ICD-10-CM | POA: Diagnosis not present

## 2016-10-25 DIAGNOSIS — I2510 Atherosclerotic heart disease of native coronary artery without angina pectoris: Secondary | ICD-10-CM | POA: Diagnosis not present

## 2016-10-25 DIAGNOSIS — I248 Other forms of acute ischemic heart disease: Principal | ICD-10-CM | POA: Insufficient documentation

## 2016-10-25 DIAGNOSIS — F17210 Nicotine dependence, cigarettes, uncomplicated: Secondary | ICD-10-CM | POA: Insufficient documentation

## 2016-10-25 DIAGNOSIS — E785 Hyperlipidemia, unspecified: Secondary | ICD-10-CM | POA: Insufficient documentation

## 2016-10-25 DIAGNOSIS — Z7982 Long term (current) use of aspirin: Secondary | ICD-10-CM | POA: Diagnosis not present

## 2016-10-25 DIAGNOSIS — Z885 Allergy status to narcotic agent status: Secondary | ICD-10-CM | POA: Insufficient documentation

## 2016-10-25 DIAGNOSIS — Z951 Presence of aortocoronary bypass graft: Secondary | ICD-10-CM | POA: Diagnosis not present

## 2016-10-25 DIAGNOSIS — J449 Chronic obstructive pulmonary disease, unspecified: Secondary | ICD-10-CM | POA: Diagnosis not present

## 2016-10-25 DIAGNOSIS — M1990 Unspecified osteoarthritis, unspecified site: Secondary | ICD-10-CM | POA: Insufficient documentation

## 2016-10-25 DIAGNOSIS — E1142 Type 2 diabetes mellitus with diabetic polyneuropathy: Secondary | ICD-10-CM | POA: Diagnosis not present

## 2016-10-25 DIAGNOSIS — Z86711 Personal history of pulmonary embolism: Secondary | ICD-10-CM | POA: Diagnosis not present

## 2016-10-25 LAB — CBC WITH DIFFERENTIAL/PLATELET
BASOS ABS: 0.1 10*3/uL (ref 0–0.1)
BASOS PCT: 1 %
EOS ABS: 0.3 10*3/uL (ref 0–0.7)
Eosinophils Relative: 5 %
HEMATOCRIT: 42.3 % (ref 40.0–52.0)
HEMOGLOBIN: 14.8 g/dL (ref 13.0–18.0)
Lymphocytes Relative: 20 %
Lymphs Abs: 1.4 10*3/uL (ref 1.0–3.6)
MCH: 32.8 pg (ref 26.0–34.0)
MCHC: 35 g/dL (ref 32.0–36.0)
MCV: 93.8 fL (ref 80.0–100.0)
MONOS PCT: 9 %
Monocytes Absolute: 0.6 10*3/uL (ref 0.2–1.0)
NEUTROS ABS: 4.5 10*3/uL (ref 1.4–6.5)
NEUTROS PCT: 65 %
PLATELETS: 185 10*3/uL (ref 150–440)
RBC: 4.51 MIL/uL (ref 4.40–5.90)
RDW: 14.6 % — AB (ref 11.5–14.5)
WBC: 6.9 10*3/uL (ref 3.8–10.6)

## 2016-10-25 LAB — COMPREHENSIVE METABOLIC PANEL
ALK PHOS: 100 U/L (ref 38–126)
ALT: 18 U/L (ref 17–63)
ANION GAP: 10 (ref 5–15)
AST: 33 U/L (ref 15–41)
Albumin: 4.6 g/dL (ref 3.5–5.0)
BUN: 10 mg/dL (ref 6–20)
CALCIUM: 10 mg/dL (ref 8.9–10.3)
CO2: 26 mmol/L (ref 22–32)
CREATININE: 1.3 mg/dL — AB (ref 0.61–1.24)
Chloride: 103 mmol/L (ref 101–111)
GFR, EST AFRICAN AMERICAN: 58 mL/min — AB (ref >60)
GFR, EST NON AFRICAN AMERICAN: 50 mL/min — AB (ref >60)
Glucose, Bld: 89 mg/dL (ref 65–99)
Potassium: 3.9 mmol/L (ref 3.5–5.1)
SODIUM: 139 mmol/L (ref 135–145)
Total Bilirubin: 1 mg/dL (ref 0.3–1.2)
Total Protein: 9.3 g/dL — ABNORMAL HIGH (ref 6.5–8.1)

## 2016-10-25 LAB — URINALYSIS, COMPLETE (UACMP) WITH MICROSCOPIC
Bacteria, UA: NONE SEEN
Bilirubin Urine: NEGATIVE
Glucose, UA: NEGATIVE mg/dL
Hgb urine dipstick: NEGATIVE
KETONES UR: NEGATIVE mg/dL
Leukocytes, UA: NEGATIVE
Nitrite: NEGATIVE
PH: 6 (ref 5.0–8.0)
Protein, ur: NEGATIVE mg/dL
RBC / HPF: NONE SEEN RBC/hpf (ref 0–5)
SPECIFIC GRAVITY, URINE: 1.006 (ref 1.005–1.030)
SQUAMOUS EPITHELIAL / LPF: NONE SEEN
WBC, UA: NONE SEEN WBC/hpf (ref 0–5)

## 2016-10-25 LAB — TROPONIN I
TROPONIN I: 0.16 ng/mL — AB (ref <0.03)
Troponin I: 0.13 ng/mL (ref <0.03)

## 2016-10-25 LAB — TSH: TSH: 0.399 u[IU]/mL (ref 0.350–4.500)

## 2016-10-25 MED ORDER — ATORVASTATIN CALCIUM 20 MG PO TABS
40.0000 mg | ORAL_TABLET | Freq: Every day | ORAL | Status: DC
  Administered 2016-10-25 – 2016-10-26 (×2): 40 mg via ORAL
  Filled 2016-10-25 (×2): qty 2

## 2016-10-25 MED ORDER — GABAPENTIN 300 MG PO CAPS
300.0000 mg | ORAL_CAPSULE | Freq: Three times a day (TID) | ORAL | Status: DC
  Administered 2016-10-25 – 2016-10-27 (×5): 300 mg via ORAL
  Filled 2016-10-25 (×5): qty 1

## 2016-10-25 MED ORDER — ASPIRIN EC 81 MG PO TBEC
81.0000 mg | DELAYED_RELEASE_TABLET | Freq: Every day | ORAL | Status: DC
  Administered 2016-10-26 – 2016-10-27 (×2): 81 mg via ORAL
  Filled 2016-10-25 (×2): qty 1

## 2016-10-25 MED ORDER — ONDANSETRON HCL 4 MG/2ML IJ SOLN: 4.0000 mg | Freq: Four times a day (QID) | INTRAMUSCULAR | Status: DC | PRN

## 2016-10-25 MED ORDER — SODIUM CHLORIDE 0.9% FLUSH: 3.0000 mL | INTRAVENOUS | Status: DC | PRN

## 2016-10-25 MED ORDER — PANTOPRAZOLE SODIUM 40 MG PO TBEC
40.0000 mg | DELAYED_RELEASE_TABLET | Freq: Every day | ORAL | Status: DC
  Administered 2016-10-26 – 2016-10-27 (×2): 40 mg via ORAL
  Filled 2016-10-25 (×2): qty 1

## 2016-10-25 MED ORDER — VITAMIN B-12 1000 MCG PO TABS
500.0000 ug | ORAL_TABLET | Freq: Every day | ORAL | Status: DC
  Administered 2016-10-26 – 2016-10-27 (×2): 500 ug via ORAL
  Filled 2016-10-25 (×2): qty 1

## 2016-10-25 MED ORDER — ACETAMINOPHEN 325 MG PO TABS
650.0000 mg | ORAL_TABLET | Freq: Four times a day (QID) | ORAL | Status: DC | PRN
  Administered 2016-10-26: 650 mg via ORAL
  Filled 2016-10-25: qty 2

## 2016-10-25 MED ORDER — HEPARIN SODIUM (PORCINE) 5000 UNIT/ML IJ SOLN
5000.0000 [IU] | Freq: Three times a day (TID) | INTRAMUSCULAR | Status: DC
  Administered 2016-10-25 – 2016-10-27 (×3): 5000 [IU] via SUBCUTANEOUS
  Filled 2016-10-25 (×4): qty 1

## 2016-10-25 MED ORDER — FUROSEMIDE 20 MG PO TABS
20.0000 mg | ORAL_TABLET | Freq: Every day | ORAL | Status: DC
  Administered 2016-10-26 – 2016-10-27 (×2): 20 mg via ORAL
  Filled 2016-10-25 (×2): qty 1

## 2016-10-25 MED ORDER — ONDANSETRON HCL 4 MG PO TABS: 4.0000 mg | ORAL_TABLET | Freq: Four times a day (QID) | ORAL | Status: DC | PRN

## 2016-10-25 MED ORDER — SODIUM CHLORIDE 0.9 % IV SOLN: 250.0000 mL | INTRAVENOUS | Status: DC | PRN

## 2016-10-25 MED ORDER — SODIUM CHLORIDE 0.9% FLUSH
3.0000 mL | Freq: Two times a day (BID) | INTRAVENOUS | Status: DC
  Administered 2016-10-25 – 2016-10-27 (×4): 3 mL via INTRAVENOUS

## 2016-10-25 MED ORDER — RISAQUAD PO CAPS
1.0000 | ORAL_CAPSULE | Freq: Two times a day (BID) | ORAL | Status: DC
  Administered 2016-10-25 – 2016-10-27 (×4): 1 via ORAL
  Filled 2016-10-25 (×4): qty 1

## 2016-10-25 MED ORDER — POTASSIUM CHLORIDE ER 10 MEQ PO TBCR
10.0000 meq | EXTENDED_RELEASE_TABLET | Freq: Every day | ORAL | Status: DC
  Administered 2016-10-26 – 2016-10-27 (×2): 10 meq via ORAL
  Filled 2016-10-25 (×4): qty 1

## 2016-10-25 MED ORDER — ACETAMINOPHEN 650 MG RE SUPP: 650.0000 mg | Freq: Four times a day (QID) | RECTAL | Status: DC | PRN

## 2016-10-25 NOTE — Clinical Social Work Note (Signed)
CSW received a call from [facility] Pt currently has an open [facility]PS Case with Social Worker Randi Hamil [phone]). [facility] and through this program is assigned a Charity fundraiserN, pharmacist, and Social Worker- Sarajane JewsMichael Forrest [phone]). CSW remains available, as needed.  Corlis HoveJeneya Kylor Valverde, Theresia MajorsLCSWA, LCASA Clinical Social Work-ED [phone]

## 2016-10-25 NOTE — ED Provider Notes (Addendum)
[facility] Emergency Department Provider Note ____________________________________________   First MD Initiated Contact with Patient 10/25/16 1559     (approximate)  I have reviewed the triage vital signs and the nursing notes.   HISTORY  Chief Complaint Hand Pain and Foot Pain    HPI [name] is a 82 y.o. male who presents with weakness and unsteady gait for several months, gradual onset, worsening within the last month, occurring both in his upper and lower extremities, and causing him to have difficulty with his activities of daily living.Patient's states that when he gets up out of bed his feet feel like the there are balls his feet, his legs feel wobbly and off and he has difficulty walking. Patient states he must hold onto the wall sometimes to keep himself steady. Patient states that he also frequently gets cramps or pain in his arms, or sudden weakness, which causes him to drop objects. Patient states that he has injured himself a few times and is concerned that he may continue to injure himself more seriously if this continues. He states he has been eating less because he often cannot hold a pan to cook or hold an egg to crack it.  Patient also reports pain in his extremities ever since a prior accident but states the main problem is the weakness and wobbliness. Patient reports intermittent lightheadedness especially when he stands up, which is also been going on over the same period and reports intermittent shortness of breath. He states that he has talked to his social worker more than once about this situation but they've not been able to offer any help and he has also follow-up with his PMD. Patient lives at home by himself; he states his grandson sometimes comes to help him but this is very sporadic and he generally feels borderline unsafe at home.   Past Medical History:  Diagnosis Date  . Arthritis   . CAD (coronary artery disease)   .  Congestive heart failure (CHF) (HCC) 06/14/2016   LVEF 40-45% 2016 echo; Dr. Darrold JunkerParaschos  . COPD (chronic obstructive pulmonary disease) (HCC)   . Diabetes mellitus without complication (HCC)   . Family history of adverse reaction to anesthesia    mom seizures   . GERD (gastroesophageal reflux disease)   . History of blood clots in legs   . History of ischemic cardiomyopathy   . 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 Hyperlipidemia   . Hypertension   . Literacy level of illiterate   . Osteomyelitis (HCC)   . PN (peripheral neuropathy)     Patient Active Problem List   Diagnosis Date Noted  . Bilateral lower extremity edema 10/19/2016  . Diabetic foot ulcers (HCC) 07/08/2016  . Congestive heart failure (CHF) (HCC) 06/14/2016  . Medication monitoring encounter 06/14/2016  . Hyperthyroidism 12/04/2015  . Unsteady gait 12/04/2015  . 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 Abnormal serum protein electrophoresis 12/05/2014  . Pressure ulcer 11/15/2014  . Hyperproteinemia 11/02/2014  . Hypoalbuminemia 11/02/2014  . Arthritis 10/23/2014  . Arteriosclerosis of coronary artery 10/23/2014  . Diabetic foot ulcer associated with type 2 diabetes mellitus (HCC) 10/23/2014  . Amputation of finger of left hand 10/23/2014  . History of pulmonary embolism 10/23/2014  . Literacy problem 10/23/2014  . Umbilical hernia without obstruction or gangrene 10/23/2014  . Vitamin B12 deficiency 10/23/2014  . Neuropathy of lower extremity 10/23/2014  . History  of osteomyelitis 10/23/2014  . Artificial cardiac pacemaker 08/10/2013  . H/O coronary artery bypass surgery 08/10/2013  . Benign prostatic hyperplasia with urinary obstruction 09/27/2012  . Abnormal prostate specific antigen 09/27/2012    Past Surgical History:  Procedure Laterality Date  . amputation 4th finger Left   . APPENDECTOMY     . CLAVICLE SURGERY  2012   open reduction and internal fixation of left clavicle  . CORONARY ARTERY BYPASS GRAFT  2002  . FOOT SURGERY    . IMPLANTABLE CARDIOVERTER DEFIBRILLATOR (ICD) GENERATOR CHANGE N/A 12/03/2015   Procedure: ICD GENERATOR CHANGE;  Surgeon: Marcina Millard, MD;  Location: [facility] ORS;  Service: Cardiovascular;  Laterality: N/A;  . PACEMAKER INSERTION      Prior to Admission medications   Medication Sig Start Date End Date Taking? Authorizing Provider  acetaminophen (TYLENOL) 500 MG tablet Take 500 mg by mouth every 8 (eight) hours as needed.   Yes [provider]  Acidophilus Lactobacillus CAPS Take 1 capsule by mouth 2 (two) times daily.   Yes [provider]  aspirin EC 81 MG tablet Take 1 tablet (81 mg total) by mouth daily. Patient taking differently: Take 325 mg by mouth every Wednesday.  05/11/16  Yes [name], Janit Bern, MD  atorvastatin (LIPITOR) 40 MG tablet Take 1 tablet (40 mg total) by mouth at bedtime. 03/18/16  Yes [name], Janit Bern, MD  busPIRone (BUSPAR) 5 MG tablet Take 5 mg by mouth 2 (two) times daily as needed.   Yes [provider]  furosemide (LASIX) 20 MG tablet Take 1 tablet (20 mg total) by mouth daily. 09/02/16  Yes [name], Janit Bern, MD  gabapentin (NEURONTIN) 300 MG capsule Take 1 capsule (300 mg total) by mouth 3 (three) times daily. 02/02/16  Yes [name], Janit Bern, MD  omeprazole (PRILOSEC) 40 MG capsule Take 1 capsule (40 mg total) by mouth daily. 12/11/15 12/10/16 Yes Sharman Cheek, MD  potassium chloride (K-DUR) 10 MEQ tablet Take 1 tablet (10 mEq total) by mouth daily. 09/02/16  Yes [name], Janit Bern, MD  vitamin B-12 (CYANOCOBALAMIN) 500 MCG tablet Take 500 mcg by mouth daily.   Yes [provider]    Allergies Oxycodone-acetaminophen and Percocet [oxycodone-acetaminophen]  Family History  Problem Relation Age of Onset  . Cancer Mother        spine  . Hypertension Mother   . Mental illness Mother   .  Cancer Father        lung  . Asthma Father   . Allergic rhinitis Father   . Arthritis Father   . Tuberculosis Father   . Gout Brother   . Allergic rhinitis Brother     Social History Social History  Substance Use Topics  . Smoking status: Current Every Day Smoker    Packs/day: 0.25    Years: 10.00    Types: Cigarettes, Cigars    Last attempt to quit: 10/18/1979  . Smokeless tobacco: Never Used  . Alcohol use No    Review of Systems  Constitutional: No fever/chills Eyes: No visual changes.  ENT: No sore throat. Cardiovascular: Denies chest pain. Respiratory: Denies shortness of breath. Gastrointestinal: No nausea, no vomiting.  No diarrhea.  Genitourinary: Negative for dysuria.  Musculoskeletal: Negative for back pain. Skin: Negative for rash. Neurological: Negative for headaches. Positive for weakness and unsteady gait.    ____________________________________________   PHYSICAL EXAM:  VITAL SIGNS: ED Triage Vitals  Enc Vitals Group     BP 10/25/16 1328 (!) 141/75  Pulse Rate 10/25/16 1328 62     Resp 10/25/16 1328 18     Temp 10/25/16 1328 97.6 F (36.4 C)     Temp Source 10/25/16 1328 Oral     SpO2 10/25/16 1328 99 %     Weight 10/25/16 1330 200 lb (90.7 kg)     Height 10/25/16 1330  (1.803 m)     Head Circumference --      Peak Flow --      Pain Score 10/25/16 1328 0     Pain Loc --      Pain Edu? --      Excl. in GC? --     Constitutional: Alert and oriented. Well appearing for age and in no acute distress. Eyes: Conjunctivae are normal.  EOMI.  PERRLA.  Head: Atraumatic. Nose: No congestion/rhinnorhea. Mouth/Throat: Mucous membranes are moist.   Neck: Normal range of motion.  Cardiovascular: Normal rate, regular rhythm. Grossly normal heart sounds.  Good peripheral circulation. Respiratory: Normal respiratory effort.  No retractions. Lungs CTAB. Gastrointestinal: Soft and nontender. No distention.  Genitourinary: No CVA  tenderness. Musculoskeletal: No lower extremity edema.  Extremities warm and well perfused.  Neurologic:  Normal speech and language. Motor and fine touch sensory intact in all extremities.  Moderate tremor on finger-to-nose.  Unsteady, waddling gait.   Skin:  Skin is warm and dry. No rash noted. Psychiatric: Mood and affect are normal. Speech and behavior are normal.  ____________________________________________   LABS (all labs ordered are listed, but only abnormal results are displayed)  Labs Reviewed  COMPREHENSIVE METABOLIC PANEL - Abnormal; Notable for the following:       Result Value   Creatinine, Ser 1.30 (*)    Total Protein 9.3 (*)    GFR calc non Af Amer 50 (*)    GFR calc Af Amer 58 (*)    All other components within normal limits  CBC WITH DIFFERENTIAL/PLATELET - Abnormal; Notable for the following:    RDW 14.6 (*)    All other components within normal limits  TROPONIN I - Abnormal; Notable for the following:    Troponin I 0.16 (*)    All other components within normal limits  URINALYSIS, COMPLETE (UACMP) WITH MICROSCOPIC - Abnormal; Notable for the following:    Color, Urine STRAW (*)    APPearance CLEAR (*)    All other components within normal limits  TSH   ____________________________________________  EKG  ED ECG REPORT I, Dionne Bucy, the attending physician, personally viewed and interpreted this ECG.  Date: 10/25/2016 EKG Time: 1636 Rate: 67 Rhythm: atrial ventricular dual paced rhythm QRS Axis: normal Intervals: borderline prolonged QTc ST/T Wave abnormalities: normal Narrative Interpretation: no evidence of acute ischemia  ED ECG REPORT I, Dionne Bucy, the attending physician, personally viewed and interpreted this ECG.  Date: 10/25/2016 EKG Time: 1819 Rate: 63 Rhythm: atrial ventricular dual paced rhythm QRS Axis: normal Intervals: borderline prolonged QTc ST/T Wave abnormalities: normal Narrative Interpretation: No change  from EKG of 1636 today  ____________________________________________  RADIOLOGY  CT head and chest x-ray with no acute findings.  ____________________________________________   PROCEDURES  Procedure(s) performed: No    Critical Care performed: No ____________________________________________   INITIAL IMPRESSION / ASSESSMENT AND PLAN / ED COURSE  Pertinent labs & imaging results that were available during my care of the patient were reviewed by me and considered in my medical decision making (see chart for details).  82 year old male present to the subacute worsening weakness to  the extremities, wobbly and unsteady gait, and difficulty performing his ADLs. She has had 2 prior ED visits over the last month, one for lightheadedness with a negative workup and improved symptoms and another 4 extremity pain which was thought to be due to neuropathy. Patient reports that the symptoms have continued but now reports that the main issue is that his weakness and unsteadiness has made it difficult for him to walk in his apartment and take care of himself. Vital signs are normal except for hypertension, patient is comfortable appearing and exam is unremarkable except for patient's tremor and his waddling gait.  DDx includes neuropathy, electrolyte abnormality, dehydration, NPH or other subacute CNS cause, arthritis.  Plan: CT head, labs, infection workup and reassess.  If pt found to have acute reversible cause of his sx, will admit; if negative workup likely hold in ED until pt can be assessed by social work as I do not believe he is a safe discharge in his current situation.     ----------------------------------------- 6:57 PM on 10/25/2016 -----------------------------------------  Patient's workup is negative except for elevated troponin, which he has not had in the past. Repeat EKG with no dynamic changes. Given possible component of acute ischemia we'll plan for admission. Patient remains  stable and feels comfortable. He has no chest pain. Patient signed out to Dr. Letitia Libra.   ____________________________________________   FINAL CLINICAL IMPRESSION(S) / ED DIAGNOSES  Final diagnoses:  Non-ST elevation (NSTEMI) myocardial infarction [facility])  Gait instability      NEW MEDICATIONS STARTED DURING THIS VISIT:  New Prescriptions   No medications on file     Note:  This document was prepared using Dragon voice recognition software and may include unintentional dictation errors.    Dionne Bucy, MD 10/25/16 Avelino Leeds    Dionne Bucy, MD 10/25/16 1900
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
## 2016-10-25 NOTE — ED Triage Notes (Signed)
Pt comes into the ED via EMS from home with c/o chronic hand and foot pain, states he has neuropathy and has issues standing, states he is scared he will drop a hot pan while cooking and hurt himself. States he has been talking with a Child psychotherapistsocial worker about getting assistance at home.

## 2016-10-26 ENCOUNTER — Observation Stay
Admit: 2016-10-26 | Discharge: 2016-10-26 | Disposition: A | Discharge status: Home / Self Care | Payer: Medicare Other | Attending: Physician Assistant | Admitting: Physician Assistant

## 2016-10-26 LAB — VITAMIN B12: VITAMIN B 12: 663 pg/mL (ref 180–914)

## 2016-10-26 LAB — TROPONIN I
TROPONIN I: 0.12 ng/mL — AB (ref <0.03)
Troponin I: 0.14 ng/mL (ref <0.03)

## 2016-10-26 NOTE — Progress Notes (Signed)
Patient admitted 9/10 at 2130 for elevated troponins without any complications. Patient has no complaints of pain. Patient is in agreement with plan of care. Will continue to monitor and assess.
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
## 2016-10-26 NOTE — Consult Note (Signed)
[facility] Cardiology  CARDIOLOGY CONSULT NOTE  Patient ID: [name] MRN: [id number] DOB/AGE: [birth year] 80 y.o.  Admit date: 10/25/2016 Referring Physician Laural Benes Primary Physician Baruch Gouty Primary Cardiologist Paraschos Reason for Consultation Elevated troponin  HPI: 82 year old male referred for evaluation of elevated troponin. The patient has a history of coronary artery disease, status post CABG 2002, ischemic cardiomyopathy with EF 40-45% per echo 11/14/2014, status post Bi-V ICD, status post generator change out 11/2015, essential hypertension, hyperlipidemia, type 2 diabetes, and history of pulmonary embolus. The patient presented to [facility] ER for subacute complaints of ataxia, burning in his feet, and numbness in his hands, all of which has been progressively worsening over the last several months, and significantly affecting his ability to perform his activities of daily living. Head CT negative for acute intracranial abnormality. ECG revealed atrial-ventricular dual paced rhythm at a rate of 60 bpm without dynamic changes with repeat ECG. Admissions labs notable for borderline elevated troponin of 0.16, 0.13, followed by 0.14, creatinine 1.3, BUN 10. The patient denies chest pain, shortness of breath, palpitations. He does report intermittent lightheadedness with position changes. Currently, the patient reports generalized, full-body tingling at times, but denies chest pain or shortness of breath.  Review of systems complete and found to be negative unless listed above     Past Medical History:  Diagnosis Date  . Arthritis   . CAD (coronary artery disease)   . Congestive heart failure (CHF) ([facility]) 06/14/2016   LVEF 40-45% 2016 echo; Dr. Darrold Junker  . COPD (chronic obstructive pulmonary disease) ([facility])   . Diabetes mellitus without complication ([facility])   . Family history of adverse reaction to anesthesia    mom seizures   . GERD (gastroesophageal reflux disease)   . History of  blood clots in legs   . History of ischemic cardiomyopathy   . 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 Hyperlipidemia   . Hypertension   . Literacy level of illiterate   . Osteomyelitis ([facility])   . PN (peripheral neuropathy)     Past Surgical History:  Procedure Laterality Date  . amputation 4th finger Left   . APPENDECTOMY    . CLAVICLE SURGERY  2012   open reduction and internal fixation of left clavicle  . CORONARY ARTERY BYPASS GRAFT  2002  . FOOT SURGERY    . IMPLANTABLE CARDIOVERTER DEFIBRILLATOR (ICD) GENERATOR CHANGE N/A 12/03/2015   Procedure: ICD GENERATOR CHANGE;  Surgeon: Marcina Millard, MD;  Location: [facility] ORS;  Service: Cardiovascular;  Laterality: N/A;  . PACEMAKER INSERTION      Prescriptions Prior to Admission  Medication Sig Dispense Refill Last Dose  . acetaminophen (TYLENOL) 500 MG tablet Take 500 mg by mouth every 8 (eight) hours as needed.   10/25/2016 at 0800  . Acidophilus Lactobacillus CAPS Take 1 capsule by mouth 2 (two) times daily.   10/25/2016 at 0800  . aspirin EC 81 MG tablet Take 1 tablet (81 mg total) by mouth daily. (Patient taking differently: Take 325 mg by mouth every Wednesday. ) 30 tablet 11 10/20/2016 at 0800  . atorvastatin (LIPITOR) 40 MG tablet Take 1 tablet (40 mg total) by mouth at bedtime. 90 tablet 1 10/24/2016 at 2000  . busPIRone (BUSPAR) 5 MG tablet Take 5 mg by mouth 2 (two) times daily as needed.   10/25/2016 at 0800  . furosemide (LASIX) 20 MG tablet Take 1 tablet (20 mg total) by mouth daily. 30 tablet 0 10/25/2016 at 0800  . gabapentin (NEURONTIN) 300 MG capsule Take 1  capsule (300 mg total) by mouth 3 (three) times daily. 90 capsule 5 10/25/2016 at 0800  . omeprazole (PRILOSEC) 40 MG capsule Take 1 capsule (40 mg total) by mouth daily. 30 capsule 1 10/25/2016 at 0800  . potassium chloride (K-DUR) 10 MEQ tablet Take 1 tablet (10 mEq total) by mouth daily. 30 tablet 0 10/25/2016 at 0800  . vitamin B-12 (CYANOCOBALAMIN) 500 MCG tablet Take 500  mcg by mouth daily.   10/25/2016 at 0800   Social History   Social History  . Marital status: Divorced    Spouse name: N/A  . Number of children: N/A  . Years of education: N/A   Occupational History  . Not on file.   Social History Main Topics  . Smoking status: Current Every Day Smoker    Packs/day: 0.25    Years: 10.00    Types: Cigarettes, Cigars    Last attempt to quit: 10/18/1979  . Smokeless tobacco: Never Used  . Alcohol use No  . Drug use: No  . Sexual activity: Not on file   Other Topics Concern  . Not on file   Social History Narrative  . No narrative on file    Family History  Problem Relation Age of Onset  . Cancer Mother        spine  . Hypertension Mother   . Mental illness Mother   . Cancer Father        lung  . Asthma Father   . Allergic rhinitis Father   . Arthritis Father   . Tuberculosis Father   . Gout Brother   . Allergic rhinitis Brother       Review of systems complete and found to be negative unless listed above      PHYSICAL EXAM  General: Well developed, well nourished, in no acute distress HEENT:  Normocephalic and atramatic Neck:  No JVD.  Lungs: Clear bilaterally to auscultation. Normal effort of breathing. Heart: HRRR . Normal S1 and S2 without gallops or murmurs.  Abdomen: Bowel sounds are positive, abdomen soft and non-tender  Msk:  Back normal, patient able to sit upright in bed. Gait not assessed. Normal strength and tone for age. Extremities: No clubbing, cyanosis or edema.   Neuro: Alert and oriented X 3. No focal neurological deficits. Psych:  Responses are tense at times, easily angered.   Labs:   Lab Results  Component Value Date   WBC 6.9 10/25/2016   HGB 14.8 10/25/2016   HCT 42.3 10/25/2016   MCV 93.8 10/25/2016   PLT 185 10/25/2016    Recent Labs Lab 10/25/16 1649  NA 139  K 3.9  CL 103  CO2 26  BUN 10  CREATININE 1.30*  CALCIUM 10.0  PROT 9.3*  BILITOT 1.0  ALKPHOS 100  ALT 18  AST 33   GLUCOSE 89   Lab Results  Component Value Date   TROPONINI 0.14 (HH) 10/26/2016    Lab Results  Component Value Date   CHOL 159 04/30/2016   CHOL 171 07/18/2015   CHOL 152 11/14/2014   Lab Results  Component Value Date   HDL 53 04/30/2016   HDL 43 07/18/2015   HDL 36 (L) 11/14/2014   Lab Results  Component Value Date   LDLCALC 77 04/30/2016   LDLCALC 99 07/18/2015   LDLCALC 95 11/14/2014   Lab Results  Component Value Date   TRIG 143 04/30/2016   TRIG 144 07/18/2015   TRIG 106 11/14/2014   Lab Results  Component Value Date   CHOLHDL 3.0 04/30/2016   No results found for: LDLDIRECT    Radiology: Dg Chest 2 View  Result Date: 10/25/2016 CLINICAL DATA:  Pt comes into the ED via EMS from home with c/o chronic hand and foot pain, states he has neuropathy and has issues standing, states he is scared he will drop a hot pan while cooking and hurt himself. EXAM: CHEST  2 VIEW COMPARISON:  08/26/2016 FINDINGS: Patient has left-sided AICD with leads to the right atrium and right ventricle. Status post median sternotomy. The heart is mildly enlarged. Mild hyperinflation. Perihilar peribronchial thickening. No focal consolidation or pleural effusions. Remote ORIF of the left clavicle. Thoracic spondylosis. IMPRESSION: No evidence for acute abnormality. Stable cardiomegaly and mild bronchitic changes. Electronically Signed   By: Norva Pavlov M.D.   On: 10/25/2016 17:18   Ct Head Wo Contrast  Result Date: 10/25/2016 CLINICAL DATA:  82 y/o  M; slowly progressive ataxia. EXAM: CT HEAD WITHOUT CONTRAST TECHNIQUE: Contiguous axial images were obtained from the base of the skull through the vertex without intravenous contrast. COMPARISON:  09/09/2016 CT of the head. FINDINGS: Brain: No evidence of acute infarction, hemorrhage, hydrocephalus, extra-axial collection or mass lesion/mass effect. The small chronic cortical infarcts in right posterior frontal and right high parietal lobes.  Small chronic lacunar infarcts in bilateral caudate heads and the left cerebellar hemisphere. Mild chronic microvascular ischemic changes of white matter and parenchymal volume loss of the brain for age. Vascular: Calcific atherosclerosis of the carotid siphons. No hyperdense vessel. Skull: Normal. Negative for fracture or focal lesion. Sinuses/Orbits: Extensive anterior ethmoid and frontal sinus disease greater on the left chronic inflammatory changes of the walls of the frontal sinuses. Normal aeration of mastoid air cells. Debris within the external auditory canals, probably cerumen. Orbits are unremarkable. Other: None. IMPRESSION: 1. No acute intracranial abnormality identified. 2. Stable chronic microvascular ischemic changes and parenchymal volume loss of the brain. Stable small right frontal and parietal cortical infarcts. Stable bilateral caudate head and left cerebellum chronic lacunar infarcts. 3. Mild interval increase of paranasal sinus disease greatest in left frontal sinus. Electronically Signed   By: Mitzi Hansen M.D.   On: 10/25/2016 17:33    EKG: AV dual-paced rhythm, 60 bpm  ASSESSMENT AND PLAN:  1. Borderline elevated troponin, in the absence of chest pain, or dynamic ECG changes. Troponin 0.16, 0.13, followed by 0.14. 2. Coronary artery disease, status post CABG 2002 3. Ataxia, subacute, progressively worsening over several months. Head CT negative for acute intracranial abnormality. 4. Neuropathy in feet, numbness in hands, subacute, progressively worsening over several months 5. Status post Bi-V ICD, status post generator change out 12/03/2015. Recent ICD interrogation on 08/05/16 revealed normal ICD function with a longevity of 7 years.  6. Ischemic cardiomyopathy, 2-D echocardiogram 11/14/2014 revealed LVEF 40-45%. 7. Essential hypertension, blood pressure adequately controlled on current BP medications 8. Hyperlipidemia 9. Type 2 diabetes   Recommendations: 1.  Continue current therapy 2. Recommend resuming home ACE inhibitor, ramipril 3. Continue aspirin 4. 2-D echocardiogram  Signed: Leanora Ivanoff PA-C 10/26/2016, 8:39 AM

## 2016-10-26 NOTE — Progress Notes (Signed)
Patient did not have a diet order in place, notified MD. Orders given for a cardiac diet.

## 2016-10-26 NOTE — Progress Notes (Signed)
[employer] - [place] [facility]                                                                                                                                                                                  Patient Demographics   [name], is a 82 y.o. male, DOB - 04/08/1934, ZOX:[id number]  Admit date - 10/25/2016   Admitting Physician Gracelyn NurseJohn D Johnston, MD  Outpatient Primary MD for the patient is [name], [name], MD   LOS - 0  Subjective: patient admitted with ataxia    Review of Systems:   CONSTITUTIONAL: No documented fever. No fatigue, weakness. No weight gain, no weight loss.  EYES: No blurry or double vision.  ENT: No tinnitus. No postnasal drip. No redness of the oropharynx.  RESPIRATORY: No cough, no wheeze, no hemoptysis. No dyspnea.  CARDIOVASCULAR: No chest pain. No orthopnea. No palpitations. No syncope.  GASTROINTESTINAL: No nausea, no vomiting or diarrhea. No abdominal pain. No melena or hematochezia.  GENITOURINARY: No dysuria or hematuria.  ENDOCRINE: No polyuria or nocturia. No heat or cold intolerance.  HEMATOLOGY: No anemia. No bruising. No bleeding.  INTEGUMENTARY: No rashes. No lesions.  MUSCULOSKELETAL: No arthritis. No swelling. No gout.  NEUROLOGIC: No numbness, tingling, + ataxia. No seizure-type activity.  PSYCHIATRIC: No anxiety. No insomnia. No ADD.    Vitals:   Vitals:   10/25/16 2121 10/26/16 0437 10/26/16 0810 10/26/16 1231  BP: 133/65 130/62 137/69 132/77  Pulse: 60 60 60 65  Resp: 18 18  20   Temp: 97.7 F (36.5 C) 97.7 F (36.5 C) 97.8 F (36.6 C) 97.6 F (36.4 C)  TempSrc: Oral Oral Oral Oral  SpO2: 99% 95% 100% 98%  Weight:  192 lb 4.8 oz (87.2 kg)    Height:        Wt Readings from Last 3 Encounters:  10/26/16 192 lb 4.8 oz (87.2 kg)  10/21/16 201 lb (91.2 kg)  10/19/16 201 lb (91.2 kg)     Intake/Output Summary (Last 24 hours) at 10/26/16 1505 Last data filed at 10/26/16 1359  Gross per 24 hour   Intake              840 ml  Output             1000 ml  Net             -160 ml    Physical Exam:   GENERAL: Pleasant-appearing in no apparent distress.  HEAD, EYES, EARS, NOSE AND THROAT: Atraumatic, normocephalic. Extraocular muscles are intact. Pupils equal and reactive to light. Sclerae anicteric. No conjunctival injection. No oro-pharyngeal erythema.  NECK: Supple. There is no jugular  venous distention. No bruits, no lymphadenopathy, no thyromegaly.  HEART: Regular rate and rhythm,. No murmurs, no rubs, no clicks.  LUNGS: Clear to auscultation bilaterally. No rales or rhonchi. No wheezes.  ABDOMEN: Soft, flat, nontender, nondistended. Has good bowel sounds. No hepatosplenomegaly appreciated.  EXTREMITIES: No evidence of any cyanosis, clubbing, or peripheral edema.  +2 pedal and radial pulses bilaterally.  NEUROLOGIC: The patient is alert, awake, and oriented x3 with no focal motor or sensory deficits appreciated bilaterally.  SKIN: Moist and warm with no rashes appreciated.  Psych: Not anxious, depressed LN: No inguinal LN enlargement    Antibiotics   Anti-infectives    None      Medications   Scheduled Meds: . acidophilus  1 capsule Oral BID  . aspirin EC  81 mg Oral Daily  . atorvastatin  40 mg Oral QHS  . furosemide  20 mg Oral Daily  . gabapentin  300 mg Oral TID  . heparin  5,000 Units Subcutaneous Q8H  . pantoprazole  40 mg Oral Daily  . potassium chloride  10 mEq Oral Daily  . sodium chloride flush  3 mL Intravenous Q12H  . vitamin B-12  500 mcg Oral Daily   Continuous Infusions: . sodium chloride     PRN Meds:.sodium chloride, acetaminophen **OR** acetaminophen, ondansetron **OR** ondansetron (ZOFRAN) IV, sodium chloride flush   Data Review:   Micro Results No results found for this or any previous visit (from the past 240 hour(s)).  Radiology Reports Dg Chest 2 View  Result Date: 10/25/2016 CLINICAL DATA:  Pt comes into the ED via EMS from home  with c/o chronic hand and foot pain, states he has neuropathy and has issues standing, states he is scared he will drop a hot pan while cooking and hurt himself. EXAM: CHEST  2 VIEW COMPARISON:  08/26/2016 FINDINGS: Patient has left-sided AICD with leads to the right atrium and right ventricle. Status post median sternotomy. The heart is mildly enlarged. Mild hyperinflation. Perihilar peribronchial thickening. No focal consolidation or pleural effusions. Remote ORIF of the left clavicle. Thoracic spondylosis. IMPRESSION: No evidence for acute abnormality. Stable cardiomegaly and mild bronchitic changes. Electronically Signed   By: Norva Pavlov M.D.   On: 10/25/2016 17:18   Ct Head Wo Contrast  Result Date: 10/25/2016 CLINICAL DATA:  82 y/o  M; slowly progressive ataxia. EXAM: CT HEAD WITHOUT CONTRAST TECHNIQUE: Contiguous axial images were obtained from the base of the skull through the vertex without intravenous contrast. COMPARISON:  09/09/2016 CT of the head. FINDINGS: Brain: No evidence of acute infarction, hemorrhage, hydrocephalus, extra-axial collection or mass lesion/mass effect. The small chronic cortical infarcts in right posterior frontal and right high parietal lobes. Small chronic lacunar infarcts in bilateral caudate heads and the left cerebellar hemisphere. Mild chronic microvascular ischemic changes of white matter and parenchymal volume loss of the brain for age. Vascular: Calcific atherosclerosis of the carotid siphons. No hyperdense vessel. Skull: Normal. Negative for fracture or focal lesion. Sinuses/Orbits: Extensive anterior ethmoid and frontal sinus disease greater on the left chronic inflammatory changes of the walls of the frontal sinuses. Normal aeration of mastoid air cells. Debris within the external auditory canals, probably cerumen. Orbits are unremarkable. Other: None. IMPRESSION: 1. No acute intracranial abnormality identified. 2. Stable chronic microvascular ischemic changes  and parenchymal volume loss of the brain. Stable small right frontal and parietal cortical infarcts. Stable bilateral caudate head and left cerebellum chronic lacunar infarcts. 3. Mild interval increase of paranasal sinus disease greatest in  left frontal sinus. Electronically Signed   By: Mitzi Hansen M.D.   On: 10/25/2016 17:33     CBC  Recent Labs Lab 10/21/16 1352 10/25/16 1649  WBC 5.3 6.9  HGB 14.0 14.8  HCT 40.8 42.3  PLT 178 185  MCV 94.2 93.8  MCH 32.4 32.8  MCHC 34.4 35.0  RDW 14.1 14.6*  LYMPHSABS  --  1.4  MONOABS  --  0.6  EOSABS  --  0.3  BASOSABS  --  0.1    Chemistries   Recent Labs Lab 10/21/16 1352 10/25/16 1649  NA 141 139  K 3.9 3.9  CL 109 103  CO2 24 26  GLUCOSE 92 89  BUN 10 10  CREATININE 1.14 1.30*  CALCIUM 9.1 10.0  AST  --  33  ALT  --  18  ALKPHOS  --  100  BILITOT  --  1.0   ------------------------------------------------------------------------------------------------------------------ estimated creatinine clearance is 49.7 mL/min (A) (by C-G formula based on SCr of 1.3 mg/dL (H)). ------------------------------------------------------------------------------------------------------------------ No results for input(s): HGBA1C in the last 72 hours. ------------------------------------------------------------------------------------------------------------------ No results for input(s): CHOL, HDL, LDLCALC, TRIG, CHOLHDL, LDLDIRECT in the last 72 hours. ------------------------------------------------------------------------------------------------------------------  Recent Labs  10/25/16 1649  TSH 0.399   ------------------------------------------------------------------------------------------------------------------ No results for input(s): VITAMINB12, FOLATE, FERRITIN, TIBC, IRON, RETICCTPCT in the last 72 hours.  Coagulation profile No results for input(s): INR, PROTIME in the last 168 hours.  No results for  input(s): DDIMER in the last 72 hours.  Cardiac Enzymes  Recent Labs Lab 10/25/16 2252 10/26/16 0447 10/26/16 1012  TROPONINI 0.13* 0.14* 0.12*   ------------------------------------------------------------------------------------------------------------------ Invalid input(s): POCBNP    Assessment & Plan   1. Elevated troponin. Due to demand ischemia no chest pain no further workup needed 2. Ataxia. Chronic problem seen by PT recommends home health 3. Ischemic cardiomyopathy. Continue his current medications. As above cycle his troponins.  4. Peripheral neuropathy. We'll continue his Neurontin. 5. Social situation. DSS been following the patient he does not meet criteria for nursing home placement.      Code Status Orders        Start     Ordered   10/25/16 2127  Full code  Continuous     10/25/16 2127    Code Status History    Date Active Date Inactive Code Status Order ID Comments User Context   12/01/2015 10:52 PM 12/04/2015  8:32 PM Full Code [id number]  Tonye Royalty, DO Inpatient   08/09/2015  6:36 PM 08/12/2015  6:56 PM Full Code [id number]  Arnaldo Natal, MD Inpatient   11/14/2014  3:58 PM 11/15/2014  9:35 PM Full Code [id number]  Auburn Bilberry, MD Inpatient           Consults none  DVT Prophylaxis  Lovenox   Lab Results  Component Value Date   PLT 185 10/25/2016     Time Spent in minutes   Greater than 50% of time spent in care coordination and counseling patient regarding the condition and plan of care.   Auburn Bilberry M.D on 10/26/2016 at 3:05 PM  Between 7am to 6pm - Pager - [phone]  After 6pm go to [email or web address] - password [email or web address] [facility]  [facility]   Office  [phone]

## 2016-10-26 NOTE — Care Management Obs Status (Signed)
MEDICARE OBSERVATION STATUS NOTIFICATION   Patient Details  Name: [name] MRN: [id number] Date of Birth: [birth year]   Medicare Observation Status Notification Given:  Yes Notice signed, one given to patient and the other to HIM for scanning    Eber HongGreene, Kushal Saunders R, RN 10/26/2016, 3:49 PM

## 2016-10-26 NOTE — Evaluation (Signed)
Physical Therapy Evaluation Patient Details Name: [name] MRN: [id number] DOB: January 15, 1935 Today's Date: 10/26/2016   History of Present Illness  82 y.o.malewith a history of CAD, CHF, COPD, diabetes, DVT, hypertension who presents with elevated troponins.   Clinical Impression  Difficult to keep pt fully on task, but ultimately he was able to ambulate around the nurses' station with slow but consistent and relatively safe ambulation using the walker.  He did not have excessive fatigue or LOBs, but stated that he liked the walker (only has unwheeled standard walker and cane at home).  He was able to to do appropriate in-home distances and though he had some numbness in LEs did not have any pain or excessive limitations, HHPT is appropriate from a PT perspective, status depending on home situation.      Follow Up Recommendations Home health PT    Equipment Recommendations  Rolling walker with 5" wheels    Recommendations for Other Services       Precautions / Restrictions Precautions Precautions: Fall Restrictions Weight Bearing Restrictions: No      Mobility  Bed Mobility Overal bed mobility: Modified Independent             General bed mobility comments: Pt able to get to sitting w/o direct assist  Transfers Overall transfer level: Modified independent Equipment used: Rolling walker (2 wheeled)             General transfer comment: Pt initially hesitant and slow with getting to EOB and standing, but then showed good ability to get to standing using rails  Ambulation/Gait Ambulation/Gait assistance: Supervision Ambulation Distance (Feet): 200 Feet Assistive device: Rolling walker (2 wheeled)       General Gait Details: Pt was relaint on UEs for ambualtion, did well with RW (states he has a standard walker at home that he does not like).  He was able to take a few steps with single UE use of hallway rails, but was more safe and confident with  walker  Stairs            Wheelchair Mobility    Modified Rankin (Stroke Patients Only)       Balance Overall balance assessment: Needs assistance Sitting-balance support: No upper extremity supported Sitting balance-Leahy Scale: Good     Standing balance support: Bilateral upper extremity supported Standing balance-Leahy Scale: Fair Standing balance comment: Pt showed good balance with UE use, poor balance w/o some UE assist numbness/decreased coordination with LEs                             Pertinent Vitals/Pain Pain Assessment: No/denies pain    Home Living Family/patient expects to be discharged to:: Private residence Living Arrangements: Other relatives (grandson, apparently pt does not feel fully safe)     Home Access: Level entry     Home Layout: One level Home Equipment: Walker - standard;Cane - single point      Prior Function Level of Independence: Independent with assistive device(s)         Comments: Pt reports he needs cane or cuising on furniture to get around the home     Hand Dominance        Extremity/Trunk Assessment   Upper Extremity Assessment Upper Extremity Assessment: Overall WFL for tasks assessed    Lower Extremity Assessment Lower Extremity Assessment: Overall WFL for tasks assessed       Communication   Communication:  (Pt very  talkative, difficult to keep on topic)  Cognition Arousal/Alertness: Awake/alert Behavior During Therapy: Impulsive Overall Cognitive Status: Within Functional Limits for tasks assessed                                        General Comments      Exercises     Assessment/Plan    PT Assessment Patient needs continued PT services  PT Problem List Decreased strength;Decreased range of motion;Decreased activity tolerance;Decreased balance;Decreased mobility;Decreased knowledge of use of DME;Decreased safety awareness       PT Treatment Interventions Gait  training;DME instruction;Functional mobility training;Balance training;Therapeutic activities;Therapeutic exercise;Neuromuscular re-education    PT Goals (Current goals can be found in the Care Plan section)  Acute Rehab PT Goals Patient Stated Goal: get feeling better with walking/balance PT Goal Formulation: With patient Time For Goal Achievement: 11/09/16 Potential to Achieve Goals: Good    Frequency Min 2X/week   Barriers to discharge        Co-evaluation               AM-PAC PT "6 Clicks" Daily Activity  Outcome Measure Difficulty turning over in bed (including adjusting bedclothes, sheets and blankets)?: None Difficulty moving from lying on back to sitting on the side of the bed? : None Difficulty sitting down on and standing up from a chair with arms (e.g., wheelchair, bedside commode, etc,.)?: A Little Help needed moving to and from a bed to chair (including a wheelchair)?: None Help needed walking in hospital room?: None Help needed climbing 3-5 steps with a railing? : A Little 6 Click Score: 22    End of Session Equipment Utilized During Treatment: Gait belt Activity Tolerance: Patient tolerated treatment well Patient left: with call bell/phone within reach;with bed alarm set   PT Visit Diagnosis: Muscle weakness (generalized) (M62.81);Difficulty in walking, not elsewhere classified (R26.2)    Time: [id number] PT Time Calculation (min) (ACUTE ONLY): 25 min   Charges:   PT Evaluation $PT Eval Low Complexity: 1 Low     PT G Codes:   PT G-Codes **NOT FOR INPATIENT CLASS** Functional Assessment Tool Used: AM-PAC 6 Clicks Basic Mobility Functional Limitation: Mobility: Walking and moving around Mobility: Walking and Moving Around Current Status (V4098(G8978): At least 20 percent but less than 40 percent impaired, limited or restricted Mobility: Walking and Moving Around Goal Status [phone](G8979): At least 1 percent but less than 20 percent impaired, limited or restricted     Malachi ProGalen R Hallie Ertl, DPT 10/26/2016, 11:40 AM

## 2016-10-27 LAB — ECHOCARDIOGRAM COMPLETE
HEIGHTINCHES: 72 in
WEIGHTICAEL: 3076.8 [oz_av]

## 2016-10-27 LAB — RPR: RPR Ser Ql: NONREACTIVE

## 2016-10-27 NOTE — Care Management (Signed)
Patient to discharge home.  Notified [facility] and discussed the need for social work due to open APS case.  Added [employer] referral.

## 2016-10-27 NOTE — Evaluation (Signed)
Occupational Therapy Evaluation Patient Details Name: [name] MRN: [id number] DOB: Jul 29, 1934 Today's Date: 10/27/2016    History of Present Illness 82 y.o.malewith a history of CAD, CHF, COPD, diabetes, DVT, hypertension who presents with elevated troponins.    Clinical Impression   Patient seen for OT evaluation this date, lives at home with his grandson and reports he was previously independent with all basic self care tasks and light homemaking skills. Patient presents with mild muscle weakness, decreased balance, reports of dizziness, shortness of breath and increased assistance with self care tasks such as lower body dressing and homemaking tasks (meal prep).  Patient would benefit from skilled OT to maximize safety and independence in daily tasks to return home.  He would likely benefit from home health OT.     Follow Up Recommendations  Home health OT    Equipment Recommendations       Recommendations for Other Services       Precautions / Restrictions Precautions Precautions: Fall Restrictions Weight Bearing Restrictions: No      Mobility Bed Mobility Overal bed mobility: Modified Independent                Transfers Overall transfer level: Modified independent Equipment used: Rolling walker (2 wheeled)             General transfer comment: Patient requires min guard for functional mobility, reports he gets dizzy at times and occasionally SOB.    Balance Overall balance assessment: Needs assistance Sitting-balance support: No upper extremity supported Sitting balance-Leahy Scale: Good     Standing balance support: Bilateral upper extremity supported Standing balance-Leahy Scale: Fair Standing balance comment: decreased balance with functional tasks at times, reports some dizziness.                           ADL either performed or assessed with clinical judgement   ADL Overall ADL's : Needs  assistance/impaired Eating/Feeding: Independent   Grooming: Independent   Upper Body Bathing: Independent   Lower Body Bathing: Set up   Upper Body Dressing : Modified independent   Lower Body Dressing: Minimal assistance   Toilet Transfer: Min guard           Functional mobility during ADLs: Min guard       Vision Patient Visual Report: No change from baseline       Perception     Praxis      Pertinent Vitals/Pain Pain Assessment: No/denies pain     Hand Dominance Right   Extremity/Trunk Assessment Upper Extremity Assessment Upper Extremity Assessment: Overall WFL for tasks assessed   Lower Extremity Assessment Lower Extremity Assessment: Overall WFL for tasks assessed       Communication Communication Communication: No difficulties   Cognition Arousal/Alertness: Awake/alert Behavior During Therapy: Impulsive;Agitated Overall Cognitive Status: Within Functional Limits for tasks assessed                                 General Comments: Patient states, "No body can tell me what my body is doing and how I feel, I am not stupid, lots of these things are from past accidents and theres nothing anyone can do about that."   General Comments       Exercises     Shoulder Instructions      Home Living Family/patient expects to be discharged to:: Private residence Living Arrangements: Other relatives  Available Help at Discharge: Other (Comment) Type of Home: Apartment Home Access: Level entry     Home Layout: One level     Bathroom Shower/Tub: Tub/shower unit         Home Equipment: Walker - standard;Cane - single point          Prior Functioning/Environment Level of Independence: Independent with assistive device(s)        Comments: uses cane at home        OT Problem List: Impaired balance (sitting and/or standing);Decreased strength;Decreased activity tolerance;Decreased knowledge of use of DME or AE      OT  Treatment/Interventions: Self-care/ADL training    OT Goals(Current goals can be found in the care plan section) Acute Rehab OT Goals Patient Stated Goal: to go home and do for myself OT Goal Formulation: With patient Time For Goal Achievement: 11/06/16 Potential to Achieve Goals: Good  OT Frequency: Min 1X/week   Barriers to D/C:            Co-evaluation              AM-PAC PT "6 Clicks" Daily Activity     Outcome Measure Help from another person eating meals?: None Help from another person taking care of personal grooming?: None Help from another person toileting, which includes using toliet, bedpan, or urinal?: A Little Help from another person bathing (including washing, rinsing, drying)?: A Little Help from another person to put on and taking off regular upper body clothing?: None Help from another person to put on and taking off regular lower body clothing?: A Little 6 Click Score: 21   End of Session Equipment Utilized During Treatment: Gait belt;Rolling walker  Activity Tolerance: Patient tolerated treatment well Patient left: in bed;with call bell/phone within reach;with bed alarm set                   Time: 1610-9604 OT Time Calculation (min): 58 min Charges:  OT General Charges $OT Visit: 1 Visit OT Evaluation $OT Eval Moderate Complexity: 1 Mod OT Treatments $Self Care/Home Management : 8-22 mins G-Codes: OT G-codes **NOT FOR INPATIENT CLASS** Functional Limitation: Self care Self Care Current Status (V4098): At least 20 percent but less than 40 percent impaired, limited or restricted Self Care Goal Status (J1914): At least 1 percent but less than 20 percent impaired, limited or restricted   Missey Hasley T Malesha Suliman, OTR/L, CLT   Thereasa Iannello 10/27/2016, 9:59 AM

## 2016-10-27 NOTE — Discharge Summary (Signed)
[facility] - [place] at [facility]  [name], 82 y.o., DOB 1934-10-17, MRN [id number]. Admission date: 10/25/2016 Discharge Date 10/27/2016 Primary MD Kerman PasseyLada, Melinda P, MD Admitting Physician Gracelyn NurseJohn D Johnston, MD  Admission Diagnosis  Gait instability [R26.81] Non-ST elevation (NSTEMI) myocardial infarction [facility](HCC) [I21.4]  Discharge Diagnosis   Active Problems: Elevated troponin due to demand ischemia no evidence of MI Ataxia is a chronic medical problem Ischemic cardiomyopathy Peripheral neuropathy Coronary artery disease COPD Diabetes type 2 GERD Hyperlipidemia Hypertension      Hospital Course  This is a 82 year old male has a history of ischemic cardiomyopathy. He has an ICD/pacemaker placed. He comes in today for complaints of ataxia and burning in his feet and numbness in his hands. Patient in the emergency room was noted to have abnormal troponin therefore he was admitted for further evaluation. Patient had no chest pain. His troponin elevation was due to demand ischemia. No further workup for that was needed. His ataxia is a chronic problem. He was seen by PT who recommended home health and PT. Patient was admitted under observation. And he did not qualify to go to rehabilitation. He'll have a Child psychotherapistsocial worker following him as outpatient             Consults  None  Significant Tests:  See full reports for all details     Dg Chest 2 View  Result Date: 10/25/2016 CLINICAL DATA:  Pt comes into the ED via EMS from home with c/o chronic hand and foot pain, states he has neuropathy and has issues standing, states he is scared he will drop a hot pan while cooking and hurt himself. EXAM: CHEST  2 VIEW COMPARISON:  08/26/2016 FINDINGS: Patient has left-sided AICD with leads to the right atrium and right ventricle. Status post median sternotomy. The heart is mildly enlarged. Mild hyperinflation. Perihilar peribronchial thickening. No focal consolidation or  pleural effusions. Remote ORIF of the left clavicle. Thoracic spondylosis. IMPRESSION: No evidence for acute abnormality. Stable cardiomegaly and mild bronchitic changes. Electronically Signed   By: Norva PavlovElizabeth  Brown M.D.   On: 10/25/2016 17:18   Ct Head Wo Contrast  Result Date: 10/25/2016 CLINICAL DATA:  82 y/o  M; slowly progressive ataxia. EXAM: CT HEAD WITHOUT CONTRAST TECHNIQUE: Contiguous axial images were obtained from the base of the skull through the vertex without intravenous contrast. COMPARISON:  09/09/2016 CT of the head. FINDINGS: Brain: No evidence of acute infarction, hemorrhage, hydrocephalus, extra-axial collection or mass lesion/mass effect. The small chronic cortical infarcts in right posterior frontal and right high parietal lobes. Small chronic lacunar infarcts in bilateral caudate heads and the left cerebellar hemisphere. Mild chronic microvascular ischemic changes of white matter and parenchymal volume loss of the brain for age. Vascular: Calcific atherosclerosis of the carotid siphons. No hyperdense vessel. Skull: Normal. Negative for fracture or focal lesion. Sinuses/Orbits: Extensive anterior ethmoid and frontal sinus disease greater on the left chronic inflammatory changes of the walls of the frontal sinuses. Normal aeration of mastoid air cells. Debris within the external auditory canals, probably cerumen. Orbits are unremarkable. Other: None. IMPRESSION: 1. No acute intracranial abnormality identified. 2. Stable chronic microvascular ischemic changes and parenchymal volume loss of the brain. Stable small right frontal and parietal cortical infarcts. Stable bilateral caudate head and left cerebellum chronic lacunar infarcts. 3. Mild interval increase of paranasal sinus disease greatest in left frontal sinus. Electronically Signed   By: Mitzi HansenLance  Furusawa-Stratton M.D.   On: 10/25/2016 17:33  Today   Subjective:   [name]  Patient currently has no  symptoms  Objective:   Blood pressure 136/62, pulse 69, temperature 98.6 F (37 C), temperature source Oral, resp. rate 19, height 6' (1.829 m), weight 193 lb (87.5 kg), SpO2 96 %.  .  Intake/Output Summary (Last 24 hours) at 10/27/16 1525 Last data filed at 10/27/16 1028  Gross per 24 hour  Intake              720 ml  Output              200 ml  Net              520 ml    Exam VITAL SIGNS: Blood pressure 136/62, pulse 69, temperature 98.6 F (37 C), temperature source Oral, resp. rate 19, height 6' (1.829 m), weight 193 lb (87.5 kg), SpO2 96 %.  GENERAL:  82 y.o.-year-old patient lying in the bed with no acute distress.  EYES: Pupils equal, round, reactive to light and accommodation. No scleral icterus. Extraocular muscles intact.  HEENT: Head atraumatic, normocephalic. Oropharynx and nasopharynx clear.  NECK:  Supple, no jugular venous distention. No thyroid enlargement, no tenderness.  LUNGS: Normal breath sounds bilaterally, no wheezing, rales,rhonchi or crepitation. No use of accessory muscles of respiration.  CARDIOVASCULAR: S1, S2 normal. No murmurs, rubs, or gallops.  ABDOMEN: Soft, nontender, nondistended. Bowel sounds present. No organomegaly or mass.  EXTREMITIES: No pedal edema, cyanosis, or clubbing.  NEUROLOGIC: Cranial nerves II through XII are intact. Muscle strength 5/5 in all extremities. Sensation intact. Gait not checked.  PSYCHIATRIC: The patient is alert and oriented x 3.  SKIN: No obvious rash, lesion, or ulcer.   Data Review     CBC w Diff: Lab Results  Component Value Date   WBC 6.9 10/25/2016   HGB 14.8 10/25/2016   HGB 12.9 (L) 09/08/2013   HCT 42.3 10/25/2016   HCT 38.9 (L) 09/08/2013   PLT 185 10/25/2016   PLT 208 09/08/2013   LYMPHOPCT 20 10/25/2016   LYMPHOPCT 24.7 04/05/2013   MONOPCT 9 10/25/2016   MONOPCT 13.4 04/05/2013   EOSPCT 5 10/25/2016   EOSPCT 3.0 04/05/2013   BASOPCT 1 10/25/2016   BASOPCT 0.9 04/05/2013   CMP: Lab  Results  Component Value Date   NA 139 10/25/2016   NA 139 07/18/2015   NA 138 09/08/2013   K 3.9 10/25/2016   K 3.8 09/08/2013   CL 103 10/25/2016   CL 106 09/08/2013   CO2 26 10/25/2016   CO2 24 09/08/2013   BUN 10 10/25/2016   BUN 16 07/18/2015   BUN 11 09/08/2013   CREATININE 1.30 (H) 10/25/2016   CREATININE 1.25 (H) 07/08/2016   PROT 9.3 (H) 10/25/2016   PROT 8.5 07/18/2015   PROT 8.6 (H) 04/05/2013   ALBUMIN 4.6 10/25/2016   ALBUMIN 4.0 07/18/2015   ALBUMIN 3.7 04/05/2013   BILITOT 1.0 10/25/2016   BILITOT 0.5 07/18/2015   BILITOT 0.5 04/05/2013   ALKPHOS 100 10/25/2016   ALKPHOS 123 (H) 04/05/2013   AST 33 10/25/2016   AST 26 04/05/2013   ALT 18 10/25/2016   ALT 22 04/05/2013  .  Micro Results No results found for this or any previous visit (from the past 240 hour(s)).   Code Status History    Date Active Date Inactive Code Status Order ID Comments User Context   10/25/2016  9:27 PM 10/27/2016  3:23 PM Full Code [id number]  Marcelino Duster  D, MD Inpatient   12/01/2015 10:52 PM 12/04/2015  8:32 PM Full Code [id number]  Tonye Royalty, DO Inpatient   08/09/2015  6:36 PM 08/12/2015  6:56 PM Full Code [id number]  Arnaldo Natal, MD Inpatient   11/14/2014  3:58 PM 11/15/2014  9:35 PM Full Code [id number]  Auburn Bilberry, MD Inpatient          Follow-up Information    Kerman Passey, MD. Go on 11/11/2016.   Specialty:  Family Medicine Why:  :20PM Contact information: [address] [place] [address] [phone]        Jobe Gibbon, [facility]   Specialty:  Home Health Services Why:  HNurse, physical therapy and social work Solicitor information: [address] [place] [address] [place] [address] [phone]        [facility] [facility].   Why:  Call to appy Contact information:  Address: [address] [place], [place] [address]    Phone: [phone]          Discharge Medications   Allergies as of 10/27/2016       Reactions   Oxycodone-acetaminophen Other (See Comments)   Causes the pt to be very irritable, says he can take this   Percocet [oxycodone-acetaminophen] Other (See Comments)   Reaction:  Irritability       Medication List    TAKE these medications   acetaminophen 500 MG tablet Commonly known as:  TYLENOL Take 500 mg by mouth every 8 (eight) hours as needed.   Acidophilus Lactobacillus Caps Take 1 capsule by mouth 2 (two) times daily.   aspirin EC 81 MG tablet Take 1 tablet (81 mg total) by mouth daily. What changed:  how much to take  when to take this   atorvastatin 40 MG tablet Commonly known as:  LIPITOR Take 1 tablet (40 mg total) by mouth at bedtime.   busPIRone 5 MG tablet Commonly known as:  BUSPAR Take 5 mg by mouth 2 (two) times daily as needed.   furosemide 20 MG tablet Commonly known as:  LASIX Take 1 tablet (20 mg total) by mouth daily.   gabapentin 300 MG capsule Commonly known as:  NEURONTIN Take 1 capsule (300 mg total) by mouth 3 (three) times daily.   omeprazole 40 MG capsule Commonly known as:  PRILOSEC Take 1 capsule (40 mg total) by mouth daily.   potassium chloride 10 MEQ tablet Commonly known as:  K-DUR Take 1 tablet (10 mEq total) by mouth daily.   vitamin B-12 500 MCG tablet Commonly known as:  CYANOCOBALAMIN Take 500 mcg by mouth daily.          Total Time in preparing paper work, data evaluation and todays exam - 35 minutes  Auburn Bilberry M.D on 10/27/2016 at 3:25 PM  [facility]   Office  [phone]

## 2016-10-27 NOTE — Discharge Instructions (Signed)
Hand Pain  Many things can cause hand pain. Some common causes are:  ? An injury.  ? Repeating the same movement with your hand over and over (overuse).  ? Osteoporosis.  ? Arthritis.  ? Lumps in the tendons or joints of the hand and wrist (ganglion cysts).  ? Infection.  Follow these instructions at home:  Pay attention to any changes in your symptoms. Take these actions to help with your discomfort:  ? If directed, put ice on the affected area:  ? Put ice in a plastic bag.  ? Place a towel between your skin and the bag.  ? Leave the ice on for 15?20 minutes, 3?4 times a day for 2 days.  ? Take over-the-counter and prescription medicines only as told by your health care provider.  ? Minimize stress on your hands and wrists as much as possible.  ? Take breaks from repetitive activity often.  ? Do stretches as told by your health care provider.  ? Do not do activities that make your pain worse.  Contact a health care provider if:  ? Your pain does not get better after a few days of self-care.  ? Your pain gets worse.  ? Your pain affects your ability to do your daily activities.  Get help right away if:  ? Your hand becomes warm, red, or swollen.  ? Your hand is numb or tingling.  ? Your hand is extremely swollen or deformed.  ? Your hand or fingers turn white or blue.  ? You cannot move your hand, wrist, or fingers.  This information is not intended to replace advice given to you by your health care provider. Make sure you discuss any questions you have with your health care provider.  Document Released: 02/28/2015 Document Revised: 07/10/2015 Document Reviewed: 02/27/2014  Elsevier Interactive Patient Education ? 2018 Elsevier Inc.

## 2016-10-27 NOTE — Clinical Social Work Note (Addendum)
CSW received a phone call from APS worker DexterRandi Hamille, [phone].  She stated patient would like to go to an ALF, eventually, but he will have to fill out Medicaid application.  CSW notified case manager who informed home health agency to see if social worker at home can assist with helping find patient an ALF, and apply for Medicaid.  CSW contacted [employer] who will pick patient up at 12:30pm to take him to his home.  Ervin KnackEric R. Doyne Ellinger, MSW, Theresia MajorsLCSWA [phone]  10/27/2016 10:31 AM

## 2016-10-28 ENCOUNTER — Other Ambulatory Visit: Payer: Self-pay | Admitting: Pharmacist

## 2016-10-28 ENCOUNTER — Other Ambulatory Visit: Payer: Self-pay | Admitting: *Deleted

## 2016-10-28 ENCOUNTER — Encounter: Payer: Self-pay | Admitting: *Deleted

## 2016-10-28 ENCOUNTER — Telehealth: Payer: Self-pay

## 2016-10-28 NOTE — Patient Outreach (Signed)
[facility]) Care Management   10/28/2016  [name] Jul 27, 1934 [id number]  [name] is an 82 y.o. male  Subjective:    Pt reports on recent hospitalization(observation stay 9/10-9/12/18), support  Stockings applied- really helping, no complaints of pain today, slept good last night.  Pt  reports he does do hand exercises to help with the neuropathy in hands/helps.   Pt reports HH RN is not longer coming to do wound care (calluses) on bottom of both feet, wears orthopedic Boots when outdoors.  Pt reports no recent falls, uses cane if needed/walker available.   Pt Reports he manages his own medications.     Objective:   Vitals:   10/28/16 1447  BP: 110/62  Pulse: 63  SpO2: 98%    ROS  Physical Exam  Constitutional: He is oriented to person, place, and time. He appears well-developed and well-nourished.  Cardiovascular: Normal rate, regular rhythm and normal heart sounds.   Respiratory: Effort normal and breath sounds normal.  GI: Soft. Bowel sounds are normal.  Musculoskeletal: He exhibits edema.  +1 edema left lower leg/ankle/top of foot.   Neurological: He is alert and oriented to person, place, and time.  Skin: Skin is warm and dry.  2 small calluses on both of each foot/scabbed over.  Psychiatric: He has a normal mood and affect. His behavior is normal. Judgment and thought content normal.    Encounter Medications:   Outpatient Encounter Prescriptions as of 10/28/2016  Medication Sig  . acetaminophen (TYLENOL) 500 MG tablet Take 500 mg by mouth every 8 (eight) hours as needed.  . Acidophilus Lactobacillus CAPS Take 1 capsule by mouth 2 (two) times daily.  [name] aspirin EC 81 MG tablet Take 1 tablet (81 mg total) by mouth daily. (Patient taking differently: Take 325 mg by mouth every Wednesday. )  . atorvastatin (LIPITOR) 40 MG tablet Take 1 tablet (40 mg total) by mouth at bedtime.  . busPIRone (BUSPAR) 5 MG tablet Take 5 mg by mouth 2 (two)  times daily as needed.  . furosemide (LASIX) 20 MG tablet Take 1 tablet (20 mg total) by mouth daily.  [name] gabapentin (NEURONTIN) 300 MG capsule Take 1 capsule (300 mg total) by mouth 3 (three) times daily.  . potassium chloride (K-DUR) 10 MEQ tablet Take 1 tablet (10 mEq total) by mouth daily.  . vitamin B-12 (CYANOCOBALAMIN) 500 MCG tablet Take 500 mcg by mouth daily.   No facility-administered encounter medications on file as of 10/28/2016.     Functional Status:   In your present state of health, do you have any difficulty performing the following activities: 10/25/2016 10/25/2016  Hearing? N N  Vision? N N  Difficulty concentrating or making decisions? N N  Walking or climbing stairs? Y Y  Dressing or bathing? Y Y  Doing errands, shopping? Malvin Johns  Preparing Food and eating ? - Y  Using the Toilet? - N  In the past six months, have you accidently leaked urine? - N  Do you have problems with loss of bowel control? - N  Managing your Medications? - N  Managing your Finances? - N  Housekeeping or managing your Housekeeping? - Y  Some recent data might be hidden    Fall/Depression Screening:    Fall Risk  10/25/2016 05/11/2016 04/30/2016  Falls in the past year? Yes No No  Number falls in past yr: 2 or more - -  Injury with Fall? No - -  Risk Factor  Category  High Fall Risk - -  Risk for fall due to : History of fall(s);Impaired balance/gait;Impaired mobility - -  Follow up Falls prevention discussed - -   PHQ 2/9 Scores 10/25/2016 10/20/2016 05/11/2016 04/30/2016 12/19/2015 07/18/2015  PHQ - 2 Score 2 0 0 0 0 0  PHQ- 9 Score 4 - - - - -    Assessment:  Pleasant 82 year old gentleman, lives with grandson/at home but did not participate during Home visit.   Pt had a recent observation stay September 10-12,2018 for dizziness, elevated troponin.  Pt  Remained in recliner during home visit.  [facility]     Peripheral neuropathy:  Support stockings on both legs/+1  edema left lower leg/ankle/top of foot.  Discussed      With pt taking stockings off at night/use of baby powder when applying during the day.  Also discussed       Importance of elevating legs as much as possible/sit down exercises.   Plan:  As discussed with pt, plan to continue to follow with Community CM services, follow up again     Next month- home visit.            Plan to send Dr. Sherie DonLada by in basket 10/28/16 home visit encounter.    [facility] CM Care Plan Problem One     Most Recent Value  Care Plan Problem One  Peripheral Neuropathy   Role Documenting the Problem One  Care Management Coordinator  Care Plan for Problem One  Active  [facility] Long Term Goal   Improvement seen in self management of Peripheral Neuropathy within the next 65 days   [facility] Long Term Goal Start Date  10/28/16  Interventions for Problem One Long Term Goal  Reviewed with pt [name] information Periphereal neuropathy, importance of exercise, use of cane/walker   [facility] #1   Pain from peripheral neuropathy would continue to be managed for the next 30 days   [facility] CM Short Term Goal #1 Start Date  10/28/16  Interventions for Short Term Goal #1  Utilized teach back method with pt - on use of support stockings/on during the day, off at night, also ongoing use Gabapentin as ordered.      Kenneth Alkenose M.   Kenneth Wessinger RN CCM [facility] Care Management  [phone]

## 2016-10-28 NOTE — Telephone Encounter (Signed)
Spoke with a [facility] nurse, I couldn't really get her name the TV in the back ground was really loud.  She states she was at the pt house and he had Buspar medication that was on his med list but not at home. She was wondering if he need to be on the medication. I looked in the chart and I saw that it was not prescribed by Dr. lada since 04/2016 also didn't see that she prescribed any more since then. I didn't see anything in the notes were he was suppose to take it. AS the RX said As needed for anxiety. Im not sure if he need to take it but I also know if he was suppose to take it Dr. lada would made sure he was on it. The nurse states she will messaged Dr. Sherie DonLada.
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
## 2016-11-01 ENCOUNTER — Emergency Department: Payer: Medicare Other

## 2016-11-01 ENCOUNTER — Other Ambulatory Visit: Payer: Medicare Other

## 2016-11-01 ENCOUNTER — Ambulatory Visit: Payer: Medicare Other | Admitting: Oncology

## 2016-11-01 ENCOUNTER — Encounter: Payer: Self-pay | Admitting: Emergency Medicine

## 2016-11-01 ENCOUNTER — Other Ambulatory Visit: Payer: Self-pay

## 2016-11-01 ENCOUNTER — Emergency Department
Admission: EM | Admit: 2016-11-01 | Discharge: 2016-11-01 | Disposition: A | Discharge status: Home / Self Care | Payer: Medicare Other | Attending: Emergency Medicine | Admitting: Emergency Medicine

## 2016-11-01 DIAGNOSIS — I110 Hypertensive heart disease with heart failure: Secondary | ICD-10-CM | POA: Insufficient documentation

## 2016-11-01 DIAGNOSIS — J449 Chronic obstructive pulmonary disease, unspecified: Secondary | ICD-10-CM | POA: Diagnosis not present

## 2016-11-01 DIAGNOSIS — M79605 Pain in left leg: Secondary | ICD-10-CM | POA: Diagnosis present

## 2016-11-01 DIAGNOSIS — E119 Type 2 diabetes mellitus without complications: Secondary | ICD-10-CM | POA: Insufficient documentation

## 2016-11-01 DIAGNOSIS — I2510 Atherosclerotic heart disease of native coronary artery without angina pectoris: Secondary | ICD-10-CM | POA: Insufficient documentation

## 2016-11-01 DIAGNOSIS — Z79899 Other long term (current) drug therapy: Secondary | ICD-10-CM | POA: Insufficient documentation

## 2016-11-01 DIAGNOSIS — I739 Peripheral vascular disease, unspecified: Secondary | ICD-10-CM | POA: Diagnosis not present

## 2016-11-01 DIAGNOSIS — F17290 Nicotine dependence, other tobacco product, uncomplicated: Secondary | ICD-10-CM | POA: Diagnosis not present

## 2016-11-01 DIAGNOSIS — G6289 Other specified polyneuropathies: Secondary | ICD-10-CM

## 2016-11-01 DIAGNOSIS — I509 Heart failure, unspecified: Secondary | ICD-10-CM | POA: Insufficient documentation

## 2016-11-01 DIAGNOSIS — F17210 Nicotine dependence, cigarettes, uncomplicated: Secondary | ICD-10-CM | POA: Diagnosis not present

## 2016-11-01 LAB — CBC WITH DIFFERENTIAL/PLATELET
Basophils Absolute: 0.1 10*3/uL (ref 0–0.1)
Basophils Relative: 1 %
Eosinophils Absolute: 0.3 10*3/uL (ref 0–0.7)
Eosinophils Relative: 5 %
HEMATOCRIT: 44.5 % (ref 40.0–52.0)
HEMOGLOBIN: 15.4 g/dL (ref 13.0–18.0)
LYMPHS ABS: 1.4 10*3/uL (ref 1.0–3.6)
LYMPHS PCT: 23 %
MCH: 32.5 pg (ref 26.0–34.0)
MCHC: 34.6 g/dL (ref 32.0–36.0)
MCV: 94 fL (ref 80.0–100.0)
MONOS PCT: 8 %
Monocytes Absolute: 0.5 10*3/uL (ref 0.2–1.0)
NEUTROS ABS: 4 10*3/uL (ref 1.4–6.5)
NEUTROS PCT: 63 %
Platelets: 200 10*3/uL (ref 150–440)
RBC: 4.73 MIL/uL (ref 4.40–5.90)
RDW: 14.5 % (ref 11.5–14.5)
WBC: 6.3 10*3/uL (ref 3.8–10.6)

## 2016-11-01 LAB — URINALYSIS, COMPLETE (UACMP) WITH MICROSCOPIC
BACTERIA UA: NONE SEEN
BILIRUBIN URINE: NEGATIVE
Glucose, UA: NEGATIVE mg/dL
Hgb urine dipstick: NEGATIVE
Ketones, ur: NEGATIVE mg/dL
LEUKOCYTES UA: NEGATIVE
Nitrite: NEGATIVE
PH: 5 (ref 5.0–8.0)
Protein, ur: NEGATIVE mg/dL
RBC / HPF: NONE SEEN RBC/hpf (ref 0–5)
Specific Gravity, Urine: 1.02 (ref 1.005–1.030)
WBC UA: NONE SEEN WBC/hpf (ref 0–5)

## 2016-11-01 LAB — COMPREHENSIVE METABOLIC PANEL
ALT: 17 U/L (ref 17–63)
AST: 30 U/L (ref 15–41)
Albumin: 4.5 g/dL (ref 3.5–5.0)
Alkaline Phosphatase: 93 U/L (ref 38–126)
Anion gap: 9 (ref 5–15)
BUN: 15 mg/dL (ref 6–20)
CHLORIDE: 105 mmol/L (ref 101–111)
CO2: 23 mmol/L (ref 22–32)
Calcium: 10.6 mg/dL — ABNORMAL HIGH (ref 8.9–10.3)
Creatinine, Ser: 1.31 mg/dL — ABNORMAL HIGH (ref 0.61–1.24)
GFR, EST AFRICAN AMERICAN: 58 mL/min — AB (ref >60)
GFR, EST NON AFRICAN AMERICAN: 50 mL/min — AB (ref >60)
Glucose, Bld: 95 mg/dL (ref 65–99)
POTASSIUM: 4 mmol/L (ref 3.5–5.1)
SODIUM: 137 mmol/L (ref 135–145)
Total Bilirubin: 1 mg/dL (ref 0.3–1.2)
Total Protein: 9.1 g/dL — ABNORMAL HIGH (ref 6.5–8.1)

## 2016-11-01 LAB — BRAIN NATRIURETIC PEPTIDE: B NATRIURETIC PEPTIDE 5: 182 pg/mL — AB (ref 0.0–100.0)

## 2016-11-01 MED ORDER — TRAMADOL HCL 50 MG PO TABS: 50.0000 mg | ORAL_TABLET | Freq: Four times a day (QID) | ORAL | 0 refills | Status: DC | PRN

## 2016-11-01 MED ORDER — PREDNISONE 50 MG PO TABS: 50.0000 mg | ORAL_TABLET | Freq: Every day | ORAL | 0 refills | Status: DC

## 2016-11-01 MED ORDER — IOPAMIDOL (ISOVUE-370) INJECTION 76%
100.0000 mL | Freq: Once | INTRAVENOUS | Status: AC | PRN
  Administered 2016-11-01: 100 mL via INTRAVENOUS
  Filled 2016-11-01: qty 100

## 2016-11-01 MED ORDER — POTASSIUM CHLORIDE ER 10 MEQ PO TBCR: 10.0000 meq | EXTENDED_RELEASE_TABLET | Freq: Every day | ORAL | 0 refills | Status: DC

## 2016-11-01 MED ORDER — FUROSEMIDE 20 MG PO TABS: 20.0000 mg | ORAL_TABLET | Freq: Every day | ORAL | 0 refills | Status: DC

## 2016-11-01 NOTE — ED Notes (Signed)
Pt reports that he is having bilat foot pain that started at 1pm - he called his home health nurse and was told to come to the ER - he denies any injury

## 2016-11-01 NOTE — ED Notes (Signed)
Pt's IVs are not pulling back, pt states "You are not going to stick my fingers" after stating POC CBG was needed. EDP notified of this.

## 2016-11-01 NOTE — ED Notes (Signed)
CT attempted to push contrast and IV infiltrated - IV Team consult placed

## 2016-11-01 NOTE — ED Provider Notes (Addendum)
[facility] Emergency Department Provider Note  ____________________________________________  Time seen: Approximately 3:16 PM  I have reviewed the triage vital signs and the nursing notes.   HISTORY  Chief Complaint Foot Pain    HPI [name] is a 82 y.o. male who presents emergency Department with multiple medical complaints. Patient has a long-standing history of arthritis, peripheral neuropathy from diabetes and previous injury, CHF, CAD, hypertension, DVT. Patient reports that today he was standing in his kitchen became very lightheaded. Patient reports that he thought he is about to pass out and went down to his hands and knees.patient did not hit his head or lose consciousness. No chest pain. No shortness of breath. Patient states it took him approximately 2 hours to lose a sensation of near syncope before he is able to get himself to his chair.patient reports chronic pain to bilateral lower extremities with chronic lower peripheral edema.Today, patient reports that he has had less swelling, increased mottling, increased pain, cold sensation to his left lower extremity. Patient denies any history of peripheral vascular disease and peripheral arterial disease. Patient has diabetic foot ulcers to both feet, however these are healing with no signs of infection and he has routine monitoring for same. Patient reports that pain to the left lower extremity has increased exponentially throughout the day. Patient still denies any headache, visual changes, chest pain, shortness of breath, abdominal pain. No palpitations.   Past Medical History:  Diagnosis Date  . Arthritis   . CAD (coronary artery disease)   . Congestive heart failure (CHF) (HCC) 06/14/2016   LVEF 40-45% 2016 echo; Dr. Darrold Junker  . COPD (chronic obstructive pulmonary disease) (HCC)   . Diabetes mellitus without complication (HCC)   . Family history of adverse reaction to anesthesia    mom  seizures   . GERD (gastroesophageal reflux disease)   . History of blood clots in legs   . History of ischemic cardiomyopathy   . 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 Hyperlipidemia   . Hypertension   . Literacy level of illiterate   . Osteomyelitis (HCC)   . PN (peripheral neuropathy)     Patient Active Problem List   Diagnosis Date Noted  . Elevated troponin 10/25/2016  . Bilateral lower extremity edema 10/19/2016  . Diabetic foot ulcers (HCC) 07/08/2016  . Congestive heart failure (CHF) (HCC) 06/14/2016  . Medication monitoring encounter 06/14/2016  . Hyperthyroidism 12/04/2015  . Unsteady gait 12/04/2015  . 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 Abnormal serum protein electrophoresis 12/05/2014  . Pressure ulcer 11/15/2014  . Hyperproteinemia 11/02/2014  . Hypoalbuminemia 11/02/2014  . Arthritis 10/23/2014  . Arteriosclerosis of coronary artery 10/23/2014  . Diabetic foot ulcer associated with type 2 diabetes mellitus (HCC) 10/23/2014  . Amputation of finger of left hand 10/23/2014  . History of pulmonary embolism 10/23/2014  . Literacy problem 10/23/2014  . Umbilical hernia without obstruction or gangrene 10/23/2014  . Vitamin B12 deficiency 10/23/2014  . Neuropathy of lower extremity 10/23/2014  . History of osteomyelitis 10/23/2014  . Artificial cardiac pacemaker 08/10/2013  . H/O coronary artery bypass surgery 08/10/2013  . Benign prostatic hyperplasia with urinary obstruction 09/27/2012  . Abnormal prostate specific antigen 09/27/2012    Past Surgical History:  Procedure Laterality Date  . amputation 4th finger Left   . APPENDECTOMY    . CLAVICLE SURGERY  2012   open reduction and internal fixation of left  clavicle  . CORONARY ARTERY BYPASS GRAFT  2002  . FOOT SURGERY    . IMPLANTABLE CARDIOVERTER DEFIBRILLATOR (ICD) GENERATOR CHANGE N/A  12/03/2015   Procedure: ICD GENERATOR CHANGE;  Surgeon: Marcina Millard, MD;  Location: [facility] ORS;  Service: Cardiovascular;  Laterality: N/A;  . PACEMAKER INSERTION      Prior to Admission medications   Medication Sig Start Date End Date Taking? Authorizing Provider  acetaminophen (TYLENOL) 500 MG tablet Take 500 mg by mouth every 8 (eight) hours as needed.    [provider]  Acidophilus Lactobacillus CAPS Take 1 capsule by mouth 2 (two) times daily.    [provider]  aspirin EC 81 MG tablet Take 1 tablet (81 mg total) by mouth daily. Patient not taking: Reported on 10/28/2016 05/11/16   Kerman Passey, MD  atorvastatin (LIPITOR) 40 MG tablet Take 1 tablet (40 mg total) by mouth at bedtime. 03/18/16   [name], Janit Bern, MD  busPIRone (BUSPAR) 5 MG tablet Take 5 mg by mouth 2 (two) times daily as needed.    [provider]  furosemide (LASIX) 20 MG tablet Take 1 tablet (20 mg total) by mouth daily. 11/01/16   Ellyn Hack, MD  gabapentin (NEURONTIN) 300 MG capsule Take 1 capsule (300 mg total) by mouth 3 (three) times daily. 02/02/16   Kerman Passey, MD  omeprazole (PRILOSEC) 20 MG capsule Take 20 mg by mouth daily.    [provider]  potassium chloride (K-DUR) 10 MEQ tablet Take 1 tablet (10 mEq total) by mouth daily. 11/01/16   Ellyn Hack, MD  predniSONE (DELTASONE) 50 MG tablet Take 1 tablet (50 mg total) by mouth daily with breakfast. 11/01/16   Cuthriell, Delorise Royals, PA-C  Saw Palmetto 1000 MG CAPS Take 1 capsule by mouth daily.    [provider]  traMADol (ULTRAM) 50 MG tablet Take 1 tablet (50 mg total) by mouth every 6 (six) hours as needed. 11/01/16   Cuthriell, Delorise Royals, PA-C  vitamin B-12 (CYANOCOBALAMIN) 500 MCG tablet Take 500 mcg by mouth daily.    [provider]    Allergies Oxycodone-acetaminophen and Percocet [oxycodone-acetaminophen]  Family History  Problem Relation Age of Onset  . Cancer Mother         spine  . Hypertension Mother   . Mental illness Mother   . Cancer Father        lung  . Asthma Father   . Allergic rhinitis Father   . Arthritis Father   . Tuberculosis Father   . Gout Brother   . Allergic rhinitis Brother     Social History Social History  Substance Use Topics  . Smoking status: Former Smoker    Packs/day: 0.25    Years: 10.00    Types: Cigarettes, Cigars    Quit date: 10/18/1979  . Smokeless tobacco: Never Used  . Alcohol use No     Review of Systems  Constitutional: No fever/chills Eyes: No visual changes. No discharge ENT: No upper respiratory complaints. Cardiovascular: no chest pain. Respiratory: no cough. No SOB. Gastrointestinal: No abdominal pain.  No nausea, no vomiting.  No diarrhea.  No constipation. Genitourinary: Negative for dysuria. No hematuria Musculoskeletal: positive for decreased swelling from baseline, increased smiling, increased coldness, increased pain to left lower extremity. Skin: Negative for rash, abrasions, lacerations, ecchymosis. Neurological: Negative for headaches, focal weakness or numbness. Near syncope. 10-point ROS otherwise negative.  ____________________________________________   PHYSICAL EXAM:  VITAL SIGNS: ED  Triage Vitals  Enc Vitals Group     BP 11/01/16 1355 131/60     Pulse Rate 11/01/16 1355 (!) 55     Resp 11/01/16 1355 20     Temp 11/01/16 1355 (!) 97.4 F (36.3 C)     Temp Source 11/01/16 1355 Oral     SpO2 11/01/16 1355 98 %     Weight 11/01/16 1355 201 lb (91.2 kg)     Height --      Head Circumference --      Peak Flow --      Pain Score 11/01/16 1354 10     Pain Loc --      Pain Edu? --      Excl. in GC? --      Constitutional: Alert and oriented. Well appearing and in no acute distress. Eyes: Conjunctivae are normal. PERRL. EOMI. Head: Atraumatic. ENT:      Ears:       Nose: No congestion/rhinnorhea.      Mouth/Throat: Mucous membranes are moist.  Neck: No stridor.     Cardiovascular: Normal rate, regular rhythm. Normal S1 and S2.  Good peripheral circulation. Respiratory: Normal respiratory effort without tachypnea or retractions. Lungs CTAB, no rales or rhonchi. Good air entry to the bases with no decreased or absent breath sounds. Musculoskeletal: patient is not ambulatory at this time.  Visualization of bilateral lower extremities reveals left lower extremity is markedly decreased edema than right. Patient reports there is no increase in edema in his right lower extremity, just decrease in edema in left lower extremity. Left lower extremity is mottled.Lower extremity  Noticeably cold to touch, especially when compared with the unaffected extremity. Dorsalis pedi but very thready and decreased from unaffected extremity. Due to chronic neuropathy, patient has no sensation in bilateral lower extremities. This is baseline. Small, diabetic ulcer noted to the sole of the foot. No signs of infection. No lacerations, abrasions, foreign bodies noted. Neurologic:  Normal speech and language. No gross focal neurologic deficits are appreciated.  Skin:  Skin is warm, dry and intact. No rash noted. Psychiatric: Mood and affect are normal. Speech and behavior are normal. Patient exhibits appropriate insight and judgement.   ____________________________________________   LABS (all labs ordered are listed, but only abnormal results are displayed)  Labs Reviewed  COMPREHENSIVE METABOLIC PANEL - Abnormal; Notable for the following:       Result Value   Creatinine, Ser 1.31 (*)    Calcium 10.6 (*)    Total Protein 9.1 (*)    GFR calc non Af Amer 50 (*)    GFR calc Af Amer 58 (*)    All other components within normal limits  BRAIN NATRIURETIC PEPTIDE - Abnormal; Notable for the following:    B Natriuretic Peptide 182.0 (*)    All other components within normal limits  URINALYSIS, COMPLETE (UACMP) WITH MICROSCOPIC - Abnormal; Notable for the following:    Color, Urine  STRAW (*)    APPearance CLEAR (*)    Squamous Epithelial / LPF 0-5 (*)    All other components within normal limits  CBC WITH DIFFERENTIAL/PLATELET   ____________________________________________  EKG  ED ECG REPORT I, Delorise Royals Cuthriell,  personally viewed and interpreted this ECG.   Date: 11/01/2016  EKG Time: 1627 hrs.  Rate: 60 bpm  Rhythm: Paced rhythm  Axis: Normal  Intervals:none  ST&T Change: No ST elevation or depression noted.  ____________________________________________  RADIOLOGY Festus Barren Cuthriell, personally viewed and evaluated  these images (plain radiographs) as part of my medical decision making, as well as reviewing the written report by the radiologist.  Dg Chest 2 View  Result Date: 11/01/2016 CLINICAL DATA:  Leg pain and bilateral foot pain with chest tightness. Fever. EXAM: CHEST  2 VIEW COMPARISON:  10/25/2016 FINDINGS: Lungs are hyperexpanded. The lungs are clear without focal pneumonia, edema, pneumothorax or pleural effusion. Cardiopericardial silhouette is at upper limits of normal for size. Left-sided pacer/AICD again noted. Bones are diffusely demineralized. Patient is status post ORIF for left clavicle fracture. IMPRESSION: Emphysema without acute cardiopulmonary findings. Electronically Signed   By: Kennith Center M.D.   On: 11/01/2016 16:29   Ct Angio Low Extrem Left W &/or Wo Contrast  Result Date: 11/01/2016 CLINICAL DATA:  Intermittent left leg and foot pain, diminished dorsalis pedis pulse, painful toes EXAM: CT ANGIOGRAPHY OF THE LEFT LOWEREXTREMITY TECHNIQUE: Multidetector CT imaging of the left lower extremitywas performed using the standard protocol during bolus administration of intravenous contrast. Multiplanar CT image reconstructions and MIPs were obtained to evaluate the vascular anatomy. CONTRAST:  100 cc Isovue 370. Please note 90 cc right forearm contrast extravasation occurred. COMPARISON:  None. FINDINGS: Left lower extremity CTA:  Outflow: LEFT common femoral, profunda femoral, and SFA demonstrate atherosclerotic changes but without significant focal stenosis, occlusion or acute vascular process. Popliteal artery is also diseased but patent across the knee joint. Runoff: Anterior tibial and tibioperoneal trunk are patent with mild disease. left anterior tibial and peroneal arteries are atherosclerotic but patent to the left foot as the dominant two-vessel runoff to the left foot. there is chronic occlusion of the left posterior tibial artery diffusely. Nonvascular: No acute finding involving the image portion of the pelvis. Previous saphenous vein harvest clips noted in the left medial thigh. No soft tissue abnormality appreciated including abscess or hematoma. Subcutaneous left lower extremity and ankle edema noted extending onto the foot. Degenerative changes of the knee and ankle joints. No large effusion appreciated. No acute osseous finding. Review of the MIP images confirms the above findings. IMPRESSION: Nonocclusive left lower extremity peripheral atherosclerosis with preserved two-vessel runoff via the left anterior tibial and peroneal arteries. 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 Mild left ankle and foot subcutaneous edema Electronically Signed   By: Judie Petit.  Shick M.D.   On: 11/01/2016 20:30   US Venous Img Lower Unilateral Left  Result Date: 11/01/2016 CLINICAL DATA:  Increased pain to the left lower extremity for 5 months. EXAM: LEFT LOWER EXTREMITY VENOUS DOPPLER ULTRASOUND TECHNIQUE: Gray-scale sonography with graded compression, as well as color Doppler and duplex ultrasound were performed to evaluate the lower extremity deep venous systems from the level of the common femoral vein and including the common femoral, femoral, profunda femoral, popliteal and calf veins including the posterior tibial, peroneal and gastrocnemius veins when visible. The superficial great saphenous vein was also interrogated.  Spectral Doppler was utilized to evaluate flow at rest and with distal augmentation maneuvers in the common femoral, femoral and popliteal veins. COMPARISON:  None. FINDINGS: Contralateral Common Femoral Vein: Respiratory phasicity is normal and symmetric with the symptomatic side. No evidence of thrombus. Normal compressibility. Common Femoral Vein: No evidence of thrombus. Normal compressibility, respiratory phasicity and response to augmentation. Saphenofemoral Junction: No evidence of thrombus. Normal compressibility and flow on color Doppler imaging. Profunda Femoral Vein: No evidence of thrombus. Normal compressibility and flow on color Doppler imaging. Femoral Vein: No evidence of thrombus. Normal compressibility, respiratory phasicity and response to augmentation.  Popliteal Vein: No evidence of thrombus. Normal compressibility, respiratory phasicity and response to augmentation. Calf Veins: No evidence of thrombus to left posterior tibial vein. Normal compressibility and flow on color Doppler imaging to left posterior tibial vein. Left perineal vein is not visualized. Superficial Great Saphenous Vein: No evidence of thrombus. Normal compressibility and flow on color Doppler imaging. Venous Reflux:  None. Other Findings:  None. IMPRESSION: No evidence of DVT within the left lower extremity. Electronically Signed   By: Sherian Rein M.D.   On: 11/01/2016 17:21    ____________________________________________    PROCEDURES  Procedure(s) performed:    Procedures    Medications  iopamidol (ISOVUE-370) 76 % injection 100 mL (100 mLs Intravenous Contrast Given 11/01/16 1807)  iopamidol (ISOVUE-370) 76 % injection 100 mL (100 mLs Intravenous Contrast Given 11/01/16 1950)     ____________________________________________   INITIAL IMPRESSION / ASSESSMENT AND PLAN / ED COURSE  Pertinent labs & imaging results that were available during my care of the patient were reviewed by me and considered in  my medical decision making (see chart for details).  Review of the [place] CSRS was performed in accordance of the [facility] prior to dispensing any controlled drugs.  Clinical Course as of Nov 02 2218  Mon Nov 01, 2016  1647 Patient presents to the emergency Department with multiple medical complaints. Extensive medical history. Patient presented saying that his left lower extremity was cold, more painful than normal, mottled, decreased in normal edema. Patient also felt presyncopal this morning. Patient did not pass out or lose consciousness. Exam reveals left lower extremity is cold to the touch, difficulty finding pulse which is thready when compared with unaffected extremity. The patient has no sensation bilateral lower extremity which she says is baseline. Due to patient's history, general labs, EKG chest x-ray is ordered. Patient is also given ultrasound for DVT and angioneurotic off to look for arterial occlusion.  [JC]    Clinical Course User Index [JC] Cuthriell, Delorise Royals, PA-C    Patient's diagnosis is consistent with Worsening neuropathy, peripheral artery disease, increasing pain in the left lower extremity. Patient presents emergency Department with a complaint of left lower leg pain. Patient has peripheral neuropathy and loss of sensation in bilateral lower legs. Patient has CHF and has chronic pedal edema. Patient had a lessening of his chronic edema without an increase in diuretic. Right lower extremity was still edematous. Both lower extremity was mottled, cold to the touch, weak thready pulse. Patient had a history of DVT but no history of peripheral vascular disease. Based on patient's presentation, I was concerned for occlusion versus DVT. As such, patient was worked up. No sign of DVT on ultrasound, CT angiogram reveals arterial occlusion but still two patent arteries feeding the lower extremity. At this time, no emergent need for vascular consult. Patient will follow-up with vascular  surgery as an outpatient. Patient is given a short course of steroids for neuropathy and very limited pain medication. Patient is cautioned highly on the side effects of pain medication.. Patient will follow-up primary care vascular surgery as directed. Patient is given ED precautions to return to the ED for any worsening or new symptoms.  Addendum added: EKG interpretation   ____________________________________________  FINAL CLINICAL IMPRESSION(S) / ED DIAGNOSES  Final diagnoses:  Other polyneuropathy  Peripheral arterial disease (HCC)  Pain of left lower extremity      NEW MEDICATIONS STARTED DURING THIS VISIT:  Discharge Medication List as of 11/01/2016  9:30 PM  START taking these medications   Details  furosemide (LASIX) 20 MG tablet Take 1 tablet (20 mg total) by mouth daily., Starting Mon 11/01/2016, Normal    potassium chloride (K-DUR) 10 MEQ tablet Take 1 tablet (10 mEq total) by mouth daily., Starting Mon 11/01/2016, Normal    predniSONE (DELTASONE) 50 MG tablet Take 1 tablet (50 mg total) by mouth daily with breakfast., Starting Mon 11/01/2016, Print    traMADol (ULTRAM) 50 MG tablet Take 1 tablet (50 mg total) by mouth every 6 (six) hours as needed., Starting Mon 11/01/2016, Print            This chart was dictated using voice recognition software/Dragon. Despite best efforts to proofread, errors can occur which can change the meaning. Any change was purely unintentional.    Racheal Patches, PA-C 11/01/16 2220    Rockne Menghini, MD 11/01/16 2305    Alm Bustard, Delorise Royals, PA-C 11/01/16 2336    Rockne Menghini, MD 11/04/16 [phone]

## 2016-11-01 NOTE — ED Notes (Signed)
Secretary notified to call ems to transport pt home

## 2016-11-01 NOTE — ED Triage Notes (Signed)
Pt to ed with c/o bilat feet pain.  Pt reports intermittent foot pain x 1 year. Worse over the last few days.

## 2016-11-01 NOTE — ED Notes (Signed)
Sandwich tray given to pt.

## 2016-11-01 NOTE — ED Notes (Signed)
CT notified IV team got IV access on pt.

## 2016-11-02 ENCOUNTER — Other Ambulatory Visit: Payer: Self-pay | Admitting: *Deleted

## 2016-11-02 NOTE — Patient Outreach (Signed)
Received a call from [employer], 82 year old male inquiring if RN CM is going to do a home visit today, has several numbers from different people.     Spoke with pt, HIPAA identifiers provided (name, date of birth) discussed with pt this RN CM follows pt with monthly home visits to which pt did recall a recent visit from RN CM.    RN CM inquired about recent ED visit yesterday to which pt reports was there for leg pain/neuropathy/vein collapse.  Pt reports needs to get more medication, to see PCP next week and Eye MD 11/24/16.     Plan:  As discussed with pt, plan to follow up again next month- home visit.    Shayne Alken.   Marcene Laskowski RN CCM [facility]  [phone]

## 2016-11-03 ENCOUNTER — Other Ambulatory Visit: Payer: Self-pay | Admitting: Pharmacist

## 2016-11-03 ENCOUNTER — Other Ambulatory Visit: Payer: Self-pay | Admitting: Licensed Clinical Social Worker
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
## 2016-11-03 NOTE — Patient Outreach (Signed)
Assessment:  CSW collaborated with RN Jodi Mourning regarding client needs. CSW collaborated with [facility] pharmacist, Haynes Hoehn regarding client needs. CSW Lorin Picket Britlyn Martine called [employer] [facility] to discuss client needs. CSW Lorna Few received a call on 11/03/16 from Wayne, Child psychotherapist at [employer], to discuss client needs. CSW Lorna Few verified identity of Randi, Child psychotherapist at [employer] [facility]. CSW identitifed self to [place] and spoke with Randi about role of [facility] program services.  Randi informed CSW that client had had more than one case with [facility]. Randi reported to CSW that [facility] [facility] had opened a case with client several months ago but, she said that case with client is now closed with [facility]. She said that she had worked with client and had offered assistance for client with transport resources but client was not receptive. She said she also spoke with client and offered help with housing options for client in the area. She said client was not receptive to assistance from her with housing options. She did say that community Medicaid application for client had been submitted to local Department of Social Services for client. She said that local Department of Social Services had not, as yet, made a decision regarding community Medicaid application for client.  Randi reported to CSW that client was usually non compliant with suggestions for help from her to address his problem area. CSW thanked Tyler for call to CSW on 11/03/16. CSW informed Donnal Debar that CSW would update [facility] RN and [facility] pharmacist regarding this patient information. Donnal Debar was appreciative of conversation with CSW on 11/03/16.   Plan:  CSW to call client as scheduled to assess client needs.  CSW to update RN Jodi Mourning and Pharmacist [facility] regarding the  above client information.  [name].Chevelle Durr MSW, LCSW Licensed Clinical Social Worker [facility] Care Management [phone]

## 2016-11-05 ENCOUNTER — Other Ambulatory Visit: Payer: Self-pay | Admitting: Pharmacist

## 2016-11-05 ENCOUNTER — Ambulatory Visit: Payer: Self-pay | Admitting: Pharmacist

## 2016-11-05 NOTE — Patient Outreach (Signed)
[facility]) Care Management  11/05/2016  Adian Jablonowski [facility] 08-17-1934 [id number]   Successful telephone encounter with Mr. Snapp today.  HIPAA identifiers verified.   Mr. Angerer reports he has not visited his pharmacy, [facility], yet to fill his discharge prescriptions for prednisone and tramadol.  He reports that he is going to try to go "soon."   Mr. Delpilar requests another call from a social worker and I explained that [facility] LCSW has a phone-call scheduled for Monday to reach out to him.    Medication issues: -Patient requested medication assistance due to not understanding his bill from [employer].  He has not paid for his medications for several months and has back charges.  Usually monthly medication costs are ~$20-$30 depending on if OTC products are included.   He currently owes ~$165.   -I explained to Mr. Lough that he currently has a Artist which accumulated from several months of medication costs.  He voiced understanding about his bill and states he would like to pay this bill with his debit card.  Mr. Obara then told me to not call him anymore.   -Previous medication issues have been resolved: Patient now taking aspirin  daily and refills on potassium and furosemide obtained.   -Patient encouraged to fill his prescriptions for prednisone and tramadol.   Plan: [facility] pharmacy will close patient case at this time due to patient request and no further medication issues noted.  I will route note to [facility] CM and LCSW.   Haynes Hoehn, PharmD, [facility] Clinical Pharmacist [facility] [employer] [phone]

## 2016-11-06 ENCOUNTER — Emergency Department
Admission: EM | Admit: 2016-11-06 | Discharge: 2016-11-06 | Disposition: A | Discharge status: Home / Self Care | Payer: Medicare Other | Attending: Emergency Medicine | Admitting: Emergency Medicine

## 2016-11-06 ENCOUNTER — Emergency Department: Payer: Medicare Other

## 2016-11-06 ENCOUNTER — Encounter: Payer: Self-pay | Admitting: Emergency Medicine

## 2016-11-06 DIAGNOSIS — J449 Chronic obstructive pulmonary disease, unspecified: Secondary | ICD-10-CM | POA: Insufficient documentation

## 2016-11-06 DIAGNOSIS — Z951 Presence of aortocoronary bypass graft: Secondary | ICD-10-CM | POA: Insufficient documentation

## 2016-11-06 DIAGNOSIS — Z7982 Long term (current) use of aspirin: Secondary | ICD-10-CM | POA: Insufficient documentation

## 2016-11-06 DIAGNOSIS — Z79899 Other long term (current) drug therapy: Secondary | ICD-10-CM | POA: Insufficient documentation

## 2016-11-06 DIAGNOSIS — I110 Hypertensive heart disease with heart failure: Secondary | ICD-10-CM | POA: Diagnosis not present

## 2016-11-06 DIAGNOSIS — R079 Chest pain, unspecified: Secondary | ICD-10-CM

## 2016-11-06 DIAGNOSIS — E1040 Type 1 diabetes mellitus with diabetic neuropathy, unspecified: Secondary | ICD-10-CM | POA: Diagnosis not present

## 2016-11-06 DIAGNOSIS — I509 Heart failure, unspecified: Secondary | ICD-10-CM | POA: Diagnosis not present

## 2016-11-06 DIAGNOSIS — Z95810 Presence of automatic (implantable) cardiac defibrillator: Secondary | ICD-10-CM | POA: Diagnosis not present

## 2016-11-06 DIAGNOSIS — Z87891 Personal history of nicotine dependence: Secondary | ICD-10-CM | POA: Insufficient documentation

## 2016-11-06 DIAGNOSIS — I2510 Atherosclerotic heart disease of native coronary artery without angina pectoris: Secondary | ICD-10-CM | POA: Diagnosis not present

## 2016-11-06 DIAGNOSIS — K859 Acute pancreatitis without necrosis or infection, unspecified: Secondary | ICD-10-CM | POA: Diagnosis not present

## 2016-11-06 LAB — CBC WITH DIFFERENTIAL/PLATELET
Basophils Absolute: 0.1 10*3/uL (ref 0–0.1)
Basophils Relative: 1 %
Eosinophils Absolute: 0.3 10*3/uL (ref 0–0.7)
Eosinophils Relative: 5 %
HEMATOCRIT: 41.3 % (ref 40.0–52.0)
HEMOGLOBIN: 14.3 g/dL (ref 13.0–18.0)
LYMPHS ABS: 1.9 10*3/uL (ref 1.0–3.6)
LYMPHS PCT: 26 %
MCH: 32.9 pg (ref 26.0–34.0)
MCHC: 34.7 g/dL (ref 32.0–36.0)
MCV: 94.8 fL (ref 80.0–100.0)
MONOS PCT: 13 %
Monocytes Absolute: 0.9 10*3/uL (ref 0.2–1.0)
NEUTROS ABS: 4.1 10*3/uL (ref 1.4–6.5)
NEUTROS PCT: 55 %
Platelets: 194 10*3/uL (ref 150–440)
RBC: 4.36 MIL/uL — AB (ref 4.40–5.90)
RDW: 14.6 % — ABNORMAL HIGH (ref 11.5–14.5)
WBC: 7.3 10*3/uL (ref 3.8–10.6)

## 2016-11-06 LAB — COMPREHENSIVE METABOLIC PANEL
ALK PHOS: 82 U/L (ref 38–126)
ALT: 14 U/L — AB (ref 17–63)
ANION GAP: 8 (ref 5–15)
AST: 26 U/L (ref 15–41)
Albumin: 4 g/dL (ref 3.5–5.0)
BILIRUBIN TOTAL: 0.7 mg/dL (ref 0.3–1.2)
BUN: 15 mg/dL (ref 6–20)
CALCIUM: 10.2 mg/dL (ref 8.9–10.3)
CO2: 23 mmol/L (ref 22–32)
CREATININE: 1.27 mg/dL — AB (ref 0.61–1.24)
Chloride: 109 mmol/L (ref 101–111)
GFR calc non Af Amer: 52 mL/min — ABNORMAL LOW (ref >60)
GFR, EST AFRICAN AMERICAN: 60 mL/min — AB (ref >60)
GLUCOSE: 100 mg/dL — AB (ref 65–99)
Potassium: 3.8 mmol/L (ref 3.5–5.1)
SODIUM: 140 mmol/L (ref 135–145)
TOTAL PROTEIN: 8.2 g/dL — AB (ref 6.5–8.1)

## 2016-11-06 LAB — LIPASE, BLOOD: Lipase: 57 U/L — ABNORMAL HIGH (ref 11–51)

## 2016-11-06 LAB — TROPONIN I

## 2016-11-06 NOTE — ED Notes (Signed)
Patient explained that he cannot get a ride back home.  Patient instructed that EMS transportation will be called for him to take him back to his home.  Patient not reliable enough to be transported back by cab voucher per MD request.

## 2016-11-06 NOTE — ED Notes (Signed)
Patient transported to X-ray 

## 2016-11-06 NOTE — ED Notes (Signed)
E-signature pad unavailable at this time.  Patient verbalized understanding of all discharge instructions and patient signed hard copy of e-signature to be placed in medical records. Patient now awaiting EMS transportation back home.

## 2016-11-06 NOTE — ED Provider Notes (Signed)
[facility] Note  ____________________________________________   First MD Initiated Contact with Patient 11/06/16 0129     (approximate)  I have reviewed the triage vital signs and the nursing notes.   HISTORY  Chief Complaint Chest Pain   HPI [name] is a 82 y.o. male with history of CAD as well as congestive heart failure and GERD who is presenting to the emergency department with a burning sensation to his chest over the last 3-4 hours. He says that it started after he belched and then he vomited times one. He says that he has no pain at this time. Says the pain was to the center of his chest. Does not report any radiation or shortness of breath. He also called EMS earlier in the day for similar complaint. An EKG was done and the patient was not transported at that time. However, since the symptoms recurred tonight he did call EMS at this point he was transported for further evaluation.   Past Medical History:  Diagnosis Date  . Arthritis   . CAD (coronary artery disease)   . Congestive heart failure (CHF) (HCC) 06/14/2016   LVEF 40-45% 2016 echo; Dr. Darrold Junker  . COPD (chronic obstructive pulmonary disease) (HCC)   . Diabetes mellitus without complication (HCC)   . Family history of adverse reaction to anesthesia    mom seizures   . GERD (gastroesophageal reflux disease)   . History of blood clots in legs   . History of ischemic cardiomyopathy   . 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 Hyperlipidemia   . Hypertension   . Literacy level of illiterate   . Osteomyelitis (HCC)   . PN (peripheral neuropathy)     Patient Active Problem List   Diagnosis Date Noted  . Elevated troponin 10/25/2016  . Bilateral lower extremity edema 10/19/2016  . Diabetic foot ulcers (HCC) 07/08/2016  . Congestive heart failure (CHF) (HCC) 06/14/2016  . Medication monitoring encounter 06/14/2016  . Hyperthyroidism 12/04/2015  .  Unsteady gait 12/04/2015  . 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 Abnormal serum protein electrophoresis 12/05/2014  . Pressure ulcer 11/15/2014  . Hyperproteinemia 11/02/2014  . Hypoalbuminemia 11/02/2014  . Arthritis 10/23/2014  . Arteriosclerosis of coronary artery 10/23/2014  . Diabetic foot ulcer associated with type 2 diabetes mellitus (HCC) 10/23/2014  . Amputation of finger of left hand 10/23/2014  . History of pulmonary embolism 10/23/2014  . Literacy problem 10/23/2014  . Umbilical hernia without obstruction or gangrene 10/23/2014  . Vitamin B12 deficiency 10/23/2014  . Neuropathy of lower extremity 10/23/2014  . History of osteomyelitis 10/23/2014  . Artificial cardiac pacemaker 08/10/2013  . H/O coronary artery bypass surgery 08/10/2013  . Benign prostatic hyperplasia with urinary obstruction 09/27/2012  . Abnormal prostate specific antigen 09/27/2012    Past Surgical History:  Procedure Laterality Date  . amputation 4th finger Left   . APPENDECTOMY    . CLAVICLE SURGERY  2012   open reduction and internal fixation of left clavicle  . CORONARY ARTERY BYPASS GRAFT  2002  . FOOT SURGERY    . IMPLANTABLE CARDIOVERTER DEFIBRILLATOR (ICD) GENERATOR CHANGE N/A 12/03/2015   Procedure: ICD GENERATOR CHANGE;  Surgeon: Marcina Millard, MD;  Location: [facility] ORS;  Service: Cardiovascular;  Laterality: N/A;  . PACEMAKER INSERTION      Prior to Admission medications   Medication Sig Start Date End Date Taking? Authorizing Provider  acetaminophen (TYLENOL) 500 MG tablet Take 500 mg by mouth every 8 (eight) hours as needed.    [provider]  Acidophilus Lactobacillus CAPS Take 1 capsule by mouth 2 (two) times daily.    [provider]  aspirin EC 81 MG tablet Take 1 tablet (81 mg total) by mouth daily. Patient not taking: Reported on  10/28/2016 05/11/16   Kerman Passey, MD  atorvastatin (LIPITOR) 40 MG tablet Take 1 tablet (40 mg total) by mouth at bedtime. 03/18/16   [name], Janit Bern, MD  busPIRone (BUSPAR) 5 MG tablet Take 5 mg by mouth 2 (two) times daily as needed.    [provider]  furosemide (LASIX) 20 MG tablet Take 1 tablet (20 mg total) by mouth daily. 11/01/16   Ellyn Hack, MD  gabapentin (NEURONTIN) 300 MG capsule Take 1 capsule (300 mg total) by mouth 3 (three) times daily. 02/02/16   Kerman Passey, MD  omeprazole (PRILOSEC) 20 MG capsule Take 20 mg by mouth daily.    [provider]  potassium chloride (K-DUR) 10 MEQ tablet Take 1 tablet (10 mEq total) by mouth daily. 11/01/16   Ellyn Hack, MD  predniSONE (DELTASONE) 50 MG tablet Take 1 tablet (50 mg total) by mouth daily with breakfast. 11/01/16   Cuthriell, Delorise Royals, PA-C  Saw Palmetto 1000 MG CAPS Take 1 capsule by mouth daily.    [provider]  traMADol (ULTRAM) 50 MG tablet Take 1 tablet (50 mg total) by mouth every 6 (six) hours as needed. 11/01/16   Cuthriell, Delorise Royals, PA-C  vitamin B-12 (CYANOCOBALAMIN) 500 MCG tablet Take 500 mcg by mouth daily.    [provider]    Allergies Oxycodone-acetaminophen and Percocet [oxycodone-acetaminophen]  Family History  Problem Relation Age of Onset  . Cancer Mother        spine  . Hypertension Mother   . Mental illness Mother   . Cancer Father        lung  . Asthma Father   . Allergic rhinitis Father   . Arthritis Father   . Tuberculosis Father   . Gout Brother   . Allergic rhinitis Brother     Social History Social History  Substance Use Topics  . Smoking status: Former Smoker    Packs/day: 0.25    Years: 10.00    Types: Cigarettes, Cigars    Quit date: 10/18/1979  . Smokeless tobacco: Never Used  . Alcohol use No    Review of Systems  Constitutional: No fever/chills Eyes: No visual changes. ENT: No sore throat. Cardiovascular: as  above. Respiratory: Denies shortness of breath. Gastrointestinal: No abdominal pain.    No diarrhea.  No constipation. Genitourinary: Negative for dysuria. Musculoskeletal: Negative for back pain. Skin: Negative for rash. Neurological: Negative for headaches, focal weakness or numbness.   ____________________________________________   PHYSICAL EXAM:  VITAL SIGNS: ED Triage Vitals [11/06/16 0129]  Enc Vitals Group     BP      Pulse      Resp      Temp      Temp src      SpO2      Weight 201 lb (91.2 kg)     Height 5' 10.5" (1.791 m)     Head Circumference      Peak Flow      Pain Score      Pain Loc      Pain Edu?  Excl. in GC?     Constitutional: Alert and oriented. Well appearing and in no acute distress. Eyes: Conjunctivae are normal.  Head: Atraumatic. Nose: No congestion/rhinnorhea. Mouth/Throat: Mucous membranes are moist.  Neck: No stridor.   Cardiovascular: Normal rate, regular rhythm. Grossly normal heart sounds.  Respiratory: Normal respiratory effort.  No retractions. Lungs CTAB. Gastrointestinal: Soft and nontender. umbilical hernia which is soft and easily reducible. Patient says it has been there for 40 years.No distention.  Musculoskeletal: No lower extremity tenderness nor edema.  No joint effusions. Neurologic:  Normal speech and language. No gross focal neurologic deficits are appreciated. Skin:  Skin is warm, dry and intact. No rash noted. Psychiatric: Mood and affect are normal. Speech and behavior are normal.  ____________________________________________   LABS (all labs ordered are listed, but only abnormal results are displayed)  Labs Reviewed  CBC WITH DIFFERENTIAL/PLATELET - Abnormal; Notable for the following:       Result Value   RBC 4.36 (*)    RDW 14.6 (*)    All other components within normal limits  COMPREHENSIVE METABOLIC PANEL - Abnormal; Notable for the following:    Glucose, Bld 100 (*)    Creatinine, Ser 1.27 (*)     Total Protein 8.2 (*)    ALT 14 (*)    GFR calc non Af Amer 52 (*)    GFR calc Af Amer 60 (*)    All other components within normal limits  LIPASE, BLOOD - Abnormal; Notable for the following:    Lipase 57 (*)    All other components within normal limits  TROPONIN I   ____________________________________________  EKG  ED ECG REPORT I, Schaevitz,  Teena Irani, the attending physician, personally viewed and interpreted this ECG.   Date: 11/06/2016  EKG Time: 1:32 AM  Rate: 63  Rhythm: atrioventricular dual paced rhythm  Axis: normal  Intervals:borderline wide complex QRS in II, III, and F aVF consistent with ventricular pacing.  ST&T Change: no ST segment elevation or depression. T-wave inversions in V4 and V5.also with T wave inversions in 2, 3 and aVF. No significant change from previous. Appears to have an upright T wave known V6. however, overall appearance is very similar to previous EKG on record.  ____________________________________________  RADIOLOGY  no acute finding on the chest x-ray ____________________________________________   PROCEDURES  Procedure(s) performed:   Procedures  Critical Care performed:   ____________________________________________   INITIAL IMPRESSION / ASSESSMENT AND PLAN / ED COURSE  Pertinent labs & imaging results that were available during my care of the patient were reviewed by me and considered in my medical decision making (see chart for details).  ----------------------------------------- 2:36 AM on 11/06/2016 ----------------------------------------- DDX: Pancreatitis, CHF, CAD, gastritis Patient at this time without any complaints. No vomiting in the emergency department. Very reassuring labs except for very mildly elevated lipase. On telling the patient does report that he has any issues with his pancreas in the past we discussed keeping a simple diet over the next several days and then to slowly advance his diet back to his  normal. Multiple hours of chest pain including throughout the day today without a troponin elevation or significant change in his EKG. The patient will be discharged home at this time. He is understanding of the plan and willing to comply.      ____________________________________________   FINAL CLINICAL IMPRESSION(S) / ED DIAGNOSES  chest pain. Abdominal pain.    NEW MEDICATIONS STARTED DURING THIS VISIT:  New Prescriptions  No medications on file     Note:  This document was prepared using Dragon voice recognition software and may include unintentional dictation errors.     Myrna Blazer, MD 11/06/16 [phone]

## 2016-11-06 NOTE — ED Triage Notes (Signed)
Pt comes into the ED via [facility] from home c/o abdominal and chest pain that started tonight.  Patient describes it as a burning pain.  Patient is alert and oriented x4 and in NAD at this time with even and unlabored respirations.  Denies any shortness of breath or dizziness at this time.

## 2016-11-08 ENCOUNTER — Other Ambulatory Visit: Payer: Self-pay | Admitting: Licensed Clinical Social Worker
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
## 2016-11-10 ENCOUNTER — Telehealth: Payer: Self-pay | Admitting: *Deleted

## 2016-11-10 NOTE — Telephone Encounter (Signed)
inbasket to Dr. Romelle Starcher PCP about the pt. Not coming to clinic.:   Dr. Sherie Don , You had referred this pt. In May for abnormal protein electrophoresis. Patient was a no show 5/14, 6/4, 6/18, 7/16 and finally his grandson brought him in 8/27 and we did blood work and asked pt to rtn in 2 weeks and discuss his labs and what plan will be made. He no showed 9/10 and then on 9/17 staff called him to ask to r/s him to come back and talk about labs and he is not coming back.  His remarks are below in the inbasket that I rcvd when are scheduler called him.  Dr. Smith [name] just wanted you to know he is not being r/s.  Thank you,  Jimmy Footman RN for Dr. Smith [name] ===View-only below this line===  ----- Message ----- From: [name] Sent: 11/04/2016  10:16 AM To: Corene Cornea, RN Subject: [name]                                 I called this pt to reschedule his appt he missed on 9/17. I can't understand this man. He was hollering and a carrying on about his ride and his sorry Child psychotherapist. He told me to just forget it, he wasn't coming back bc it didn't matter. Just so you know.

## 2016-11-11 ENCOUNTER — Ambulatory Visit: Payer: Medicare Other | Admitting: Family Medicine

## 2016-11-15 ENCOUNTER — Ambulatory Visit: Payer: Self-pay | Admitting: Pharmacist

## 2016-11-24 ENCOUNTER — Encounter: Payer: Self-pay | Admitting: Family Medicine

## 2016-11-24 ENCOUNTER — Ambulatory Visit (INDEPENDENT_AMBULATORY_CARE_PROVIDER_SITE_OTHER): Payer: Medicare Other | Admitting: Family Medicine

## 2016-11-24 VITALS — BP 146/74 | HR 83 | Temp 98.4°F | Resp 16 | Wt 202.0 lb

## 2016-11-24 DIAGNOSIS — Z5181 Encounter for therapeutic drug level monitoring: Secondary | ICD-10-CM | POA: Diagnosis not present

## 2016-11-24 DIAGNOSIS — R079 Chest pain, unspecified: Secondary | ICD-10-CM

## 2016-11-24 DIAGNOSIS — E11621 Type 2 diabetes mellitus with foot ulcer: Secondary | ICD-10-CM | POA: Diagnosis not present

## 2016-11-24 DIAGNOSIS — I779 Disorder of arteries and arterioles, unspecified: Secondary | ICD-10-CM | POA: Diagnosis not present

## 2016-11-24 DIAGNOSIS — L97401 Non-pressure chronic ulcer of unspecified heel and midfoot limited to breakdown of skin: Secondary | ICD-10-CM | POA: Diagnosis not present

## 2016-11-24 DIAGNOSIS — I5022 Chronic systolic (congestive) heart failure: Secondary | ICD-10-CM

## 2016-11-24 DIAGNOSIS — I2510 Atherosclerotic heart disease of native coronary artery without angina pectoris: Secondary | ICD-10-CM

## 2016-11-24 DIAGNOSIS — E538 Deficiency of other specified B group vitamins: Secondary | ICD-10-CM

## 2016-11-24 MED ORDER — NITROGLYCERIN 0.4 MG SL SUBL: 0.4000 mg | SUBLINGUAL_TABLET | Freq: Once | SUBLINGUAL | Status: DC

## 2016-11-24 MED ORDER — ASPIRIN 81 MG PO CHEW: 324.0000 mg | CHEWABLE_TABLET | Freq: Once | ORAL | Status: DC

## 2016-11-24 NOTE — Assessment & Plan Note (Signed)
Check labs 

## 2016-11-24 NOTE — Assessment & Plan Note (Signed)
Refer to vascular doctor 

## 2016-11-24 NOTE — Progress Notes (Signed)
BP (!) 146/74   Pulse 83   Temp 98.4 F (36.9 C) (Oral)   Resp 16   Wt 202 lb (91.6 kg)   SpO2 97%   BMI 28.57 kg/m    Subjective:    Patient ID: [name], male    DOB: 03-03-1934, 82 y.o.   MRN: [id number]  HPI: [name] is a 82 y.o. male  Chief Complaint  Patient presents with  . Follow-up    HPI Patient is here for f/u He has type 2 diabetes He does not check his sugars; he says his sugar goes up high if someone aggravates him but he just knows, doesn't stick his fingers, "I ain't doing it for nobody" he says No sugary drinks; last soda was 3 mnths ago; he drinks unsweet tea, he sweetens the tea himself with artificial sweetener Diabetic pressure ulcers on both feet; has been to podiatrist and wound clinic  He is taking cholesterol medicine Last labs in March reviewed  He was in the ER on Sept 22nd and had elevated troponins; he saw Dr. Darrold Junker in June 2018; he has told his heart doctor about his chest pain and that's in the note in June, "nonexertional chest discomfort"; also has chronic peripheral edema; treated with lasix; he has a pacemaker  Depression screen [facility] 2/9 11/24/2016 10/25/2016 10/20/2016 05/11/2016 04/30/2016  Decreased Interest 0 1 0 0 0  Down, Depressed, Hopeless 0 1 0 0 0  PHQ - 2 Score 0 2 0 0 0  Altered sleeping - 0 - - -  Tired, decreased energy - 1 - - -  Change in appetite - 0 - - -  Feeling bad or failure about yourself  - 0 - - -  Trouble concentrating - 0 - - -  Moving slowly or fidgety/restless - 1 - - -  Suicidal thoughts - 0 - - -  PHQ-9 Score - 4 - - -  Difficult doing work/chores - Somewhat difficult - - -    Relevant past medical, surgical, family and social history reviewed Past Medical History:  Diagnosis Date  . Arthritis   . CAD (coronary artery disease)   . Congestive heart failure (CHF) (HCC) 06/14/2016   LVEF 40-45% 2016 echo; Dr. Darrold Junker  . COPD (chronic obstructive pulmonary disease) (HCC)   .  Diabetes mellitus without complication (HCC)   . Family history of adverse reaction to anesthesia    mom seizures   . GERD (gastroesophageal reflux disease)   . History of blood clots in legs   . History of ischemic cardiomyopathy   . 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 Hyperlipidemia   . Hypertension   . Literacy level of illiterate   . Osteomyelitis (HCC)   . PN (peripheral neuropathy)    Past Surgical History:  Procedure Laterality Date  . amputation 4th finger Left   . APPENDECTOMY    . CLAVICLE SURGERY  2012   open reduction and internal fixation of left clavicle  . CORONARY ARTERY BYPASS GRAFT  2002  . FOOT SURGERY    . IMPLANTABLE CARDIOVERTER DEFIBRILLATOR (ICD) GENERATOR CHANGE N/A 12/03/2015   Procedure: ICD GENERATOR CHANGE;  Surgeon: Marcina Millard, MD;  Location: [facility] ORS;  Service: Cardiovascular;  Laterality: N/A;  . PACEMAKER INSERTION     Family History  Problem Relation Age of Onset  . Cancer Mother        spine  . Hypertension Mother   . Mental illness Mother   . Cancer Father  lung  . Asthma Father   . Allergic rhinitis Father   . Arthritis Father   . Tuberculosis Father   . Gout Brother   . Allergic rhinitis Brother    Social History   Social History  . Marital status: Divorced    Spouse name: N/A  . Number of children: N/A  . Years of education: N/A   Occupational History  . Not on file.   Social History Main Topics  . Smoking status: Former Smoker    Packs/day: 0.25    Years: 10.00    Types: Cigarettes, Cigars    Quit date: 10/18/1979  . Smokeless tobacco: Never Used  . Alcohol use No  . Drug use: No  . Sexual activity: Not on file   Other Topics Concern  . Not on file   Social History Narrative  . No narrative on file    Interim medical history since last visit reviewed. Allergies and medications reviewed  Review of Systems Per HPI unless specifically indicated above     Objective:    BP (!) 146/74   Pulse  83   Temp 98.4 F (36.9 C) (Oral)   Resp 16   Wt 202 lb (91.6 kg)   SpO2 97%   BMI 28.57 kg/m   Wt Readings from Last 3 Encounters:  11/24/16 202 lb (91.6 kg)  11/06/16 201 lb (91.2 kg)  11/01/16 201 lb (91.2 kg)    Physical Exam  Constitutional: He appears well-developed and well-nourished. No distress.  Weight stable  Neck: No JVD present.  Cardiovascular: Normal rate and regular rhythm.   Pulmonary/Chest: Effort normal and breath sounds normal.  Abdominal: He exhibits no distension.  Musculoskeletal: He exhibits edema (1+ pitting edema).       Hands: Status post amputation of left ring finger distal to the PIP  Neurological:  Dense neuropathy of both lower extremities almost to the knees  Psychiatric: He has a normal mood and affect.  talkative   Diabetic Foot Form - Detailed   Diabetic Foot Exam - detailed Diabetic Foot exam was performed with the following findings:  Yes 11/24/2016 10:59 AM  Visual Foot Exam completed.:  Yes  Pulse Foot Exam completed.:  Yes  Right Dorsalis Pedis:  Diminished Left Dorsalis Pedis:  Diminished  Sensory Foot Exam Completed.:  Yes Semmes-Weinstein Monofilament Test R Site 1-Great Toe:  Neg L Site 1-Great Toe:  Neg          Assessment & Plan:   Problem List Items Addressed This Visit      Cardiovascular and Mediastinum   Peripheral arterial occlusive disease (HCC)    Refer to vascular doctor      Relevant Orders   Ambulatory referral to Vascular Surgery   Congestive heart failure (CHF) (HCC)    Managed by cardiologist; continue diuretic      Arteriosclerosis of coronary artery (Chronic)    Managed by cardiologist; taking aspirin daily        Endocrine   Diabetic foot ulcer associated with type 2 diabetes mellitus (HCC) (Chronic)    Continue f/u with podiatrist; check A1c today      Relevant Orders   Microalbumin / creatinine urine ratio (Completed)   Lipid panel (Completed)   Hemoglobin A1c (Completed)     Other     Vitamin B12 deficiency    Check labs      Relevant Orders   B12 (Completed)   Medication monitoring encounter    Check labs  Relevant Orders   COMPLETE METABOLIC PANEL WITH GFR (Completed)    Other Visit Diagnoses    Chest pain, unspecified type    -  Primary   ongoing, chronic issue; cardiologist aware; encouraged him to keep f/u with heart doctor       Follow up plan: Return in about 3 months (around 02/24/2017) for twenty minute follow-up with fasting labs.  An after-visit summary was printed and given to the patient at check-out.  Please see the patient instructions which may contain other information and recommendations beyond what is mentioned above in the assessment and plan.  Meds ordered this encounter  Medications  . DISCONTD: aspirin chewable tablet 324 mg  . DISCONTD: nitroGLYCERIN (NITROSTAT) SL tablet 0.4 mg    Orders Placed This Encounter  Procedures  . Microalbumin / creatinine urine ratio  . Lipid panel  . Hemoglobin A1c  . COMPLETE METABOLIC PANEL WITH GFR  . B12  . Ambulatory referral to Vascular Surgery

## 2016-11-24 NOTE — Assessment & Plan Note (Signed)
Continue f/u with podiatrist; check A1c today

## 2016-11-24 NOTE — Assessment & Plan Note (Signed)
Managed by cardiologist; taking aspirin daily

## 2016-11-24 NOTE — Assessment & Plan Note (Signed)
Managed by cardiologist; continue diuretic

## 2016-11-24 NOTE — Patient Instructions (Addendum)
Please bring in your bottles of your medicines to every appointment to verify your medicines Please see your cardiologist regularly Please keep your follow-up with your foot doctor We'll have you see the vascular doctor about your circulation

## 2016-11-25 ENCOUNTER — Telehealth: Payer: Self-pay

## 2016-11-25 LAB — MICROALBUMIN / CREATININE URINE RATIO
Creatinine, Urine: 50 mg/dL (ref 20–320)
Microalb Creat Ratio: 22 mcg/mg creat (ref <30)
Microalb, Ur: 1.1 mg/dL

## 2016-11-25 LAB — COMPLETE METABOLIC PANEL WITH GFR
AG Ratio: 1.1 (calc) (ref 1.0–2.5)
ALBUMIN MSPROF: 4.2 g/dL (ref 3.6–5.1)
ALT: 12 U/L (ref 9–46)
AST: 20 U/L (ref 10–35)
Alkaline phosphatase (APISO): 93 U/L (ref 40–115)
BILIRUBIN TOTAL: 0.6 mg/dL (ref 0.2–1.2)
BUN / CREAT RATIO: 11 (calc) (ref 6–22)
BUN: 14 mg/dL (ref 7–25)
CHLORIDE: 107 mmol/L (ref 98–110)
CO2: 23 mmol/L (ref 20–32)
Calcium: 9.6 mg/dL (ref 8.6–10.3)
Creat: 1.27 mg/dL — ABNORMAL HIGH (ref 0.70–1.11)
GFR, EST AFRICAN AMERICAN: 61 mL/min/{1.73_m2} (ref >=60)
GFR, Est Non African American: 53 mL/min/{1.73_m2} — ABNORMAL LOW (ref >=60)
GLUCOSE: 114 mg/dL (ref 65–139)
Globulin: 3.8 g/dL (calc) — ABNORMAL HIGH (ref 1.9–3.7)
Potassium: 4 mmol/L (ref 3.5–5.3)
Sodium: 137 mmol/L (ref 135–146)
TOTAL PROTEIN: 8 g/dL (ref 6.1–8.1)

## 2016-11-25 LAB — LIPID PANEL
Cholesterol: 148 mg/dL (ref <200)
HDL: 44 mg/dL (ref >40)
LDL Cholesterol (Calc): 73 mg/dL (calc)
Non-HDL Cholesterol (Calc): 104 mg/dL (calc) (ref <130)
Total CHOL/HDL Ratio: 3.4 (calc) (ref <5.0)
Triglycerides: 222 mg/dL — ABNORMAL HIGH (ref <150)

## 2016-11-25 LAB — VITAMIN B12: Vitamin B-12: 1378 pg/mL — ABNORMAL HIGH (ref 200–1100)

## 2016-11-25 LAB — HEMOGLOBIN A1C
EAG (MMOL/L): 6.8 (calc)
HEMOGLOBIN A1C: 5.9 %{Hb} — AB (ref <5.7)
MEAN PLASMA GLUCOSE: 123 (calc)

## 2016-11-25 NOTE — Telephone Encounter (Signed)
[name]  [facility] [phone] about meds he is on everything on his med list except for the buspar.  I told her you did not prescribe this, but does he need to be taking if so can you give refill?

## 2016-11-25 NOTE — Telephone Encounter (Signed)
I am not in favor of continuing the buspar; we'll take it off the med list Thank you

## 2016-11-25 NOTE — Telephone Encounter (Signed)
Left voicemail notifying nurse Porfirio Mylar

## 2016-11-29 ENCOUNTER — Other Ambulatory Visit: Payer: Self-pay | Admitting: Licensed Clinical Social Worker

## 2016-11-29 ENCOUNTER — Telehealth: Payer: Self-pay

## 2016-11-29 NOTE — Telephone Encounter (Signed)
Received call from Sarajane Jews, LCSW with [facility] who states that he has concerns regarding this pt. He has spoken with the pt several time (see documentation in encounters) during these conversations the pt has verbalized to social worked that he is having difficulty with having adequate food, pt states that sometimes he has to crawl to the kitchen in order to get food. The patient verbalized to the social worker that he is currently sleeping in his lift chair, and spending several hours a day in his lift chair. He is currently living with his grandson, but states that he works a lot. The patient states that he would like to discuss what can be done to receive a nursing home placement and states that you and he briefly discussed this at his last visit. Of note Casimiro Needle states that he asked the patient to call us with these concerns but patient was reluctant to do so. Casimiro Needle believes that if we call the patient he will open up to [place] but he does not believe the patient will call on his own. Please advise.

## 2016-11-29 NOTE — Telephone Encounter (Signed)
If you would, please get the social worker on the phone and I'll be glad to speak with him today, thank you
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
## 2016-11-30 ENCOUNTER — Ambulatory Visit: Payer: Self-pay | Admitting: *Deleted

## 2016-12-01 ENCOUNTER — Telehealth: Payer: Self-pay | Admitting: Family Medicine

## 2016-12-01 ENCOUNTER — Other Ambulatory Visit: Payer: Self-pay | Admitting: Licensed Clinical Social Worker

## 2016-12-01 NOTE — Telephone Encounter (Signed)
Called Sarajane JewsMichael Forrest, LCSW who spoke with Dr.[name] at length regarding this pt's concerns and how we can best address them.

## 2016-12-01 NOTE — Telephone Encounter (Signed)
Called LCSW back to speak with physician however Dr.[name] was in a room, will call back later.
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
## 2016-12-01 NOTE — Patient Outreach (Signed)
Assessment:  CSW received a phone call on 12/01/16 from Dr. Sherie DonLada, primary care doctor of client.  Dr. Sherie DonLada identified herself.  Dr. Sherie DonLada and CSW spoke of client needs.  CSW informed Dr.[name] of current home situation of client and of the current needs of client.  Client is asking for skilled nursing facility placement at this time. Client is at risk for falls, has weakness on standing, has difficulty with transfers, has difficulty preparing his meals or doing activities of daily living.  CSW shared this information with Dr. Sherie DonLada regarding client needs. Dr. Sherie DonLada said she would complete an FL-2 for client for skilled nursing level of care.  CSW will receive completed FL-2 from Dr. Sherie DonLada. CSW will then fax completed FL-2 on client to several skilled nursing facilities in [place], [place] for client to be considered for admission to those nursing facilities. CSW thanked Dr. Sherie DonLada for her assistance regarding client needs. CSW provided Dr. Sherie DonLada with CSW phone number of [phone] if Dr. Sherie DonLada needed to call me again to discuss client social work needs.  Plan:  Dr. Sherie DonLada to complete FL-2 for client for skilled nursing level of care.  CSW to receive completed client FL-2 from Dr. Sherie DonLada and CSW to fax completed FL-2 on client to several local skilled nursing facilities [place].  CSW to call client as scheduled to assess client needs.  Kelton PillarMichael S.Mao Lockner MSW, LCSW Licensed Clinical Social Worker [facility] Care Management [phone]

## 2016-12-02 ENCOUNTER — Telehealth: Payer: Self-pay | Admitting: Family Medicine

## 2016-12-02 NOTE — Telephone Encounter (Signed)
FL-2 faxed, Scott notified.

## 2016-12-02 NOTE — Telephone Encounter (Signed)
Stanton Kid[facility]Debra [facility] requesting last H&P, demograph sheet, height and weight on the patient. Please fax the information: (F) [phone] (P) [phone]

## 2016-12-03 ENCOUNTER — Other Ambulatory Visit: Payer: Self-pay | Admitting: *Deleted

## 2016-12-03 NOTE — Telephone Encounter (Signed)
All information collected and faxed to Stanton KidneyDebra [facility] per request on 12/03/2016.

## 2016-12-03 NOTE — Patient Outreach (Signed)
[facility]) Care Management   12/03/2016  [name] 27-Oct-1934 [id number]  [name] is an 82 y.o. male  Subjective:  Pt reports no complaints of pain today to feet, at times feet feel like an  Ice bucket, taking Ibuprofen twice a day helps with the pain.    Pt reports he continues  To take Neurontin also.  Pt reports when he feels off balance crawls to the kitchen Or bathroom, but most times can walk.  Discussed with pt information Lorna FewScott Forrest  [facility] LCSW shared- FL2 sent [place] facilities [place], [facility]  Responded needing more information.  Pt reports two of his friends come several  Times a week, help with breakfast - appreciates the work [employer] LCSW is doing for  Him, needs SNF- weak, grandson works a lot/not always available.   Objective:   Vitals:   12/03/16 1125  BP: 128/70  Pulse: 62  Resp: 16  SpO2: 97%    ROS  Physical Exam  Constitutional: He is oriented to person, place, and time. He appears well-developed and well-nourished.  Cardiovascular: Normal rate, regular rhythm and normal heart sounds.   Pt has pacemaker.   Respiratory: Effort normal and breath sounds normal.  GI: Soft. Bowel sounds are normal.  Musculoskeletal: Normal range of motion. He exhibits edema.  Trace edema to left ankle/top of feet.   +2 edema to right lower extremity/ankle/foot   Neurological: He is alert and oriented to person, place, and time.  Skin: Skin is warm and dry.  Psychiatric: He has a normal mood and affect. His behavior is normal. Judgment and thought content normal.    Encounter Medications:   Outpatient Encounter Prescriptions as of 12/03/2016  Medication Sig Note  . acetaminophen (TYLENOL) 500 MG tablet Take 500 mg by mouth every 8 (eight) hours as needed. 10/28/2016: Pt takes two tablets twice a day   . Acidophilus Lactobacillus CAPS Take 1 capsule by mouth 2 (two) times daily.   [name]. aspirin EC 81 MG tablet Take 1 tablet (81  mg total) by mouth daily. 10/28/2016: Pt to start taking, pharmacy to deliver   . atorvastatin (LIPITOR) 40 MG tablet Take 1 tablet (40 mg total) by mouth at bedtime.   . furosemide (LASIX) 20 MG tablet Take 1 tablet (20 mg total) by mouth daily.   [name]. gabapentin (NEURONTIN) 300 MG capsule Take 1 capsule (300 mg total) by mouth 3 (three) times daily.   [name]. omeprazole (PRILOSEC) 20 MG capsule Take 20 mg by mouth daily. 10/28/2016: Pt taking differently- every other day   . potassium chloride (K-DUR) 10 MEQ tablet Take 1 tablet (10 mEq total) by mouth daily.   . Saw Palmetto 1000 MG CAPS Take 1 capsule by mouth daily. 10/28/2016: Pt taking 1200 mg daily   . vitamin B-12 (CYANOCOBALAMIN) 500 MCG tablet Take 500 mcg by mouth daily.    No facility-administered encounter medications on file as of 12/03/2016.     Functional Status:   In your present state of health, do you have any difficulty performing the following activities: 11/24/2016 10/28/2016  Hearing? N N  Vision? N N  Difficulty concentrating or making decisions? Y N  Walking or climbing stairs? Y Y  Dressing or bathing? N N  Doing errands, shopping? Malvin JohnsY Y  Preparing Food and eating ? - Y  Using the Toilet? - N  In the past six months, have you accidently leaked urine? - N  Do  you have problems with loss of bowel control? - N  Managing your Medications? - N  Managing your Finances? - N  Housekeeping or managing your Housekeeping? - N  Some recent data might be hidden    Fall/Depression Screening:    Fall Risk  11/24/2016 10/25/2016 05/11/2016  Falls in the past year? No Yes No  Number falls in past yr: - 2 or more -  Injury with Fall? - No -  Risk Factor Category  - High Fall Risk -  Risk for fall due to : - History of fall(s);Impaired balance/gait;Impaired mobility -  Follow up - Falls prevention discussed -   PHQ 2/9 Scores 11/24/2016 10/25/2016 10/20/2016 05/11/2016 04/30/2016 12/19/2015 07/18/2015  PHQ - 2 Score 0 2 0 0 0 0 0  PHQ- 9  Score - 4 - - - - -    Assessment:  Pleasant 82 year old gentleman, resides with grandson.  Arrived at pt's home At the same time pt was getting out of grandson's car (shopping with grandson), pt walked to His recliner/stayed there during home visit.   Lungs clear, no complaints of sob, chest pain.   Neuropathy: no complaints of pain, tingling today.   Per pt continues with Neurontin, also      Taking Ibuprofen twice a day- helps with pain.  LOC needs:  Per Lorna Few [facility] LCSW request- informed pt of paperwork [facility]) given to      5 different SNF  facilities in [facility], that [facility] responded/still in       Process.  Provided pt with [facility] phone number for pt to call.    Edema:  Trace left ankle/top of foot.  +2 right lower extremity/ankle/top of foot.  Per pt, did      Not apply compression stockings today- out with grandson.     Plan:  As discussed with pt, plan to follow up again telephonically in 2 weeks check on status.            RN CM to continue to collaborate with Lorin Picket [facility] LCSW about pt going to SNF.              As discussed with pt, to continue to wear compression stockings to help decrease                Edema in legs.     [facility] CM Care Plan Problem One     Most Recent Value  Care Plan Problem One  Peripheral Neuropathy   Role Documenting the Problem One  Care Management Coordinator  Care Plan for Problem One  Active  [facility] Long Term Goal   Improvement seen in self management of  Perpheral Neuropathy within the next 65 days   [facility] Long Term Goal Start Date  10/28/16  [facility] CM Short Term Goal #1   Pain from peripheral neuroopathy would continue to be managed for the next 30 days   [facility] CM Short Term Goal #1 Start Date  10/28/16    [facility] CM Care Plan Problem Two     Most Recent Value  Care Plan Problem Two  edema in bilateral lower legs   Role Documenting the Problem Two  Care Management Coordinator  Care Plan for Problem Two  Active  [facility] CM Short Term  Goal #1   Pt would see a decrease in edema in legs within the next 14 days   [facility] CM Short Term Goal #1 Start Date  12/03/16     Shayne Alken.   Pierzchala RN CCM [facility] Care Management  [phone]

## 2016-12-15 ENCOUNTER — Encounter (INDEPENDENT_AMBULATORY_CARE_PROVIDER_SITE_OTHER): Payer: Self-pay | Admitting: Vascular Surgery

## 2016-12-17 ENCOUNTER — Other Ambulatory Visit: Payer: Self-pay | Admitting: Family Medicine

## 2016-12-17 ENCOUNTER — Other Ambulatory Visit: Payer: Self-pay | Admitting: *Deleted

## 2016-12-17 NOTE — Patient Outreach (Signed)
Unsuccessful telephone outreach to [employer] Lamb[employer], 82 year old male- check on clinical status, Right leg edema, pain.  Pt's history includes but not limited to DM, artificial cardiac pacemaker, pulmonary embolism, CHF, Arthritis, literacy problem.   Unable to leave a voice message  at this time, voice message not set up.     Plan:  RN CM to follow up again next week telephonically- check on status.    [name]   Pierzchala RN CCM [facility] Care Management  [phone]

## 2016-12-20 ENCOUNTER — Other Ambulatory Visit: Payer: Self-pay | Admitting: *Deleted

## 2016-12-20 NOTE — Patient Outreach (Signed)
Successful telephone encounter to [employer] Zupko, 82 year old male, check on clinical status, right leg edema, pain.  Pt's history includes but not limited to DM, pulmonary embolism, CHF, Arthritis, Artificial cardiac pacemaker, literacy problem.   Spoke with pt, HIPAA identifiers verified.  Pt reports on feet/legs- numbness, burning, aches, sometimes cold as ice. Pt reports  still has edema in both legs, left not bad, has not been wearing compression stockings, does not feel they help.  Pt reports continues to take all of his medications, includes Neurontin and Ibuprofen - sometimes works/sometimes not.  Pt reports to follow up with PCP next month.   RN CM reinforced with pt to continue to take medications as ordered, apply compression stockings/elevate legs to which pt voiced understanding/agreed to do..   Plan:  As discussed with pt, plan to follow up again within 2 weeks, check on status.    [name]   Danny Yackley RN CCM [facility] Care Management  [phone]

## 2016-12-30 ENCOUNTER — Other Ambulatory Visit: Payer: Self-pay | Admitting: Licensed Clinical Social Worker

## 2016-12-30 ENCOUNTER — Telehealth: Payer: Self-pay | Admitting: Family Medicine

## 2016-12-30 NOTE — Telephone Encounter (Signed)
Lorna FewScott Forrest from [facility] is requesting that we please send the FL-2, H&P and med list to [employer] admin at [facility] in [place]. They did not receive it. Please fax to [phone].  Lorin PicketScott can be reached at: [phone]

## 2016-12-30 NOTE — Telephone Encounter (Signed)
Form faxed to [facility] again to fax listed below.
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
## 2017-01-03 ENCOUNTER — Other Ambulatory Visit: Payer: Self-pay | Admitting: *Deleted

## 2017-01-03 ENCOUNTER — Other Ambulatory Visit: Payer: Self-pay

## 2017-01-03 ENCOUNTER — Other Ambulatory Visit: Payer: Self-pay | Admitting: Family Medicine

## 2017-01-03 ENCOUNTER — Emergency Department
Admission: EM | Admit: 2017-01-03 | Discharge: 2017-01-03 | Disposition: A | Discharge status: Home / Self Care | Payer: Medicare Other | Attending: Emergency Medicine | Admitting: Emergency Medicine

## 2017-01-03 DIAGNOSIS — Z79899 Other long term (current) drug therapy: Secondary | ICD-10-CM | POA: Insufficient documentation

## 2017-01-03 DIAGNOSIS — E119 Type 2 diabetes mellitus without complications: Secondary | ICD-10-CM | POA: Diagnosis not present

## 2017-01-03 DIAGNOSIS — Z87891 Personal history of nicotine dependence: Secondary | ICD-10-CM | POA: Insufficient documentation

## 2017-01-03 DIAGNOSIS — K219 Gastro-esophageal reflux disease without esophagitis: Secondary | ICD-10-CM | POA: Insufficient documentation

## 2017-01-03 DIAGNOSIS — I119 Hypertensive heart disease without heart failure: Secondary | ICD-10-CM | POA: Insufficient documentation

## 2017-01-03 DIAGNOSIS — J449 Chronic obstructive pulmonary disease, unspecified: Secondary | ICD-10-CM | POA: Insufficient documentation

## 2017-01-03 DIAGNOSIS — R1013 Epigastric pain: Secondary | ICD-10-CM

## 2017-01-03 LAB — COMPREHENSIVE METABOLIC PANEL
ALBUMIN: 4.2 g/dL (ref 3.5–5.0)
ALT: 16 U/L — ABNORMAL LOW (ref 17–63)
ANION GAP: 8 (ref 5–15)
AST: 30 U/L (ref 15–41)
Alkaline Phosphatase: 93 U/L (ref 38–126)
BUN: 16 mg/dL (ref 6–20)
CO2: 24 mmol/L (ref 22–32)
Calcium: 9.9 mg/dL (ref 8.9–10.3)
Chloride: 107 mmol/L (ref 101–111)
Creatinine, Ser: 1.12 mg/dL (ref 0.61–1.24)
GFR calc Af Amer: >60 mL/min (ref >60)
GFR calc non Af Amer: >60 mL/min (ref >60)
GLUCOSE: 121 mg/dL — AB (ref 65–99)
POTASSIUM: 3.5 mmol/L (ref 3.5–5.1)
SODIUM: 139 mmol/L (ref 135–145)
Total Bilirubin: 0.8 mg/dL (ref 0.3–1.2)
Total Protein: 8.7 g/dL — ABNORMAL HIGH (ref 6.5–8.1)

## 2017-01-03 LAB — CBC
HEMATOCRIT: 42.9 % (ref 40.0–52.0)
HEMOGLOBIN: 15 g/dL (ref 13.0–18.0)
MCH: 33.6 pg (ref 26.0–34.0)
MCHC: 35 g/dL (ref 32.0–36.0)
MCV: 95.8 fL (ref 80.0–100.0)
Platelets: 171 10*3/uL (ref 150–440)
RBC: 4.48 MIL/uL (ref 4.40–5.90)
RDW: 14.3 % (ref 11.5–14.5)
WBC: 6.9 10*3/uL (ref 3.8–10.6)

## 2017-01-03 LAB — LIPASE, BLOOD: Lipase: 62 U/L — ABNORMAL HIGH (ref 11–51)

## 2017-01-03 LAB — TROPONIN I: Troponin I: <0.03 ng/mL (ref <0.03)

## 2017-01-03 MED ORDER — OMEPRAZOLE 20 MG PO CPDR: 20.0000 mg | DELAYED_RELEASE_CAPSULE | Freq: Every day | ORAL | 0 refills | Status: DC

## 2017-01-03 MED ORDER — GI COCKTAIL ~~LOC~~
30.0000 mL | Freq: Once | ORAL | Status: AC
  Administered 2017-01-03: 30 mL via ORAL
  Filled 2017-01-03: qty 30

## 2017-01-03 MED ORDER — PANTOPRAZOLE SODIUM 40 MG PO TBEC
40.0000 mg | DELAYED_RELEASE_TABLET | Freq: Once | ORAL | Status: AC
  Administered 2017-01-03: 40 mg via ORAL
  Filled 2017-01-03: qty 1

## 2017-01-03 MED ORDER — ALUM & MAG HYDROXIDE-SIMETH 200-200-20 MG/5ML PO SUSP: 30.0000 mL | ORAL | 0 refills | Status: DC | PRN

## 2017-01-03 MED ORDER — PANTOPRAZOLE SODIUM 40 MG PO TBEC: 40.0000 mg | DELAYED_RELEASE_TABLET | Freq: Every day | ORAL | 1 refills | Status: DC

## 2017-01-03 NOTE — ED Provider Notes (Signed)
[facility] Emergency Department Provider Note  Time seen: 9:14 PM  I have reviewed the triage vital signs and the nursing notes.   HISTORY  Chief Complaint Gastroesophageal Reflux    HPI [name] is a 82 y.o. male with a past medical history of CHF, COPD, diabetes, gastric reflux, hypertension, hyperlipidemia, neuropathy, presents to the emergency department for epigastric burning.  According to the patient there is an issue approximately 5-6 days ago at the pharmacy and the patient did not get his gastric reflux medication filled.  He states for the past 3 days he has been experiencing significant gastric reflux which he describes as epigastric burning occasionally radiating into his throat with a bitter taste.  Patient denies any chest pain.  Denies any current shortness of breath.  States occasional nausea but denies vomiting, denies diarrhea.  Denies fever.  Patient states a long history of gastric reflux to which this feels identical.  Past Medical History:  Diagnosis Date  . Arthritis   . CAD (coronary artery disease)   . Congestive heart failure (CHF) (HCC) 06/14/2016   LVEF 40-45% 2016 echo; Dr. Darrold JunkerParaschos  . COPD (chronic obstructive pulmonary disease) (HCC)   . Diabetes mellitus without complication (HCC)   . Family history of adverse reaction to anesthesia    mom seizures   . GERD (gastroesophageal reflux disease)   . History of blood clots in legs   . History of ischemic cardiomyopathy   . 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 Hyperlipidemia   . Hypertension   . Literacy level of illiterate   . Osteomyelitis (HCC)   . PN (peripheral neuropathy)     Patient Active Problem List   Diagnosis Date Noted  . Peripheral arterial occlusive disease (HCC) 11/24/2016  . Elevated troponin 10/25/2016  . Bilateral lower extremity edema 10/19/2016  . Diabetic foot ulcers (HCC) 07/08/2016  . Congestive heart failure (CHF) (HCC) 06/14/2016  .  Medication monitoring encounter 06/14/2016  . Hyperthyroidism 12/04/2015  . Unsteady gait 12/04/2015  . 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 Abnormal serum protein electrophoresis 12/05/2014  . Pressure ulcer 11/15/2014  . Hyperproteinemia 11/02/2014  . Hypoalbuminemia 11/02/2014  . Arthritis 10/23/2014  . Arteriosclerosis of coronary artery 10/23/2014  . Diabetic foot ulcer associated with type 2 diabetes mellitus (HCC) 10/23/2014  . Amputation of finger of left hand 10/23/2014  . History of pulmonary embolism 10/23/2014  . Literacy problem 10/23/2014  . Umbilical hernia without obstruction or gangrene 10/23/2014  . Vitamin B12 deficiency 10/23/2014  . Neuropathy of lower extremity 10/23/2014  . History of osteomyelitis 10/23/2014  . Artificial cardiac pacemaker 08/10/2013  . H/O coronary artery bypass surgery 08/10/2013  . Benign prostatic hyperplasia with urinary obstruction 09/27/2012  . Abnormal prostate specific antigen 09/27/2012    Past Surgical History:  Procedure Laterality Date  . amputation 4th finger Left   . APPENDECTOMY    . CLAVICLE SURGERY  2012   open reduction and internal fixation of left clavicle  . CORONARY ARTERY BYPASS GRAFT  2002  . FOOT SURGERY    . ICD GENERATOR CHANGE N/A 12/03/2015   Performed by Marcina MillardParaschos, Alexander, MD at [facility] ORS  . PACEMAKER INSERTION      Prior to Admission medications   Medication Sig Start Date End Date Taking? Authorizing Provider  acetaminophen (TYLENOL) 500 MG tablet Take 500 mg by mouth every 8 (eight) hours as needed.  [provider]  Acidophilus Lactobacillus CAPS Take 1 capsule by mouth 2 (two) times daily.    [provider]  aspirin EC 81 MG tablet Take 1 tablet (81 mg total) by mouth daily. 05/11/16   Kerman Passey, MD  atorvastatin (LIPITOR) 40 MG tablet Take 1 tablet (40  mg total) by mouth at bedtime. 12/17/16   Kerman Passey, MD  furosemide (LASIX) 20 MG tablet Take 1 tablet (20 mg total) by mouth daily. 11/01/16   Ellyn Hack, MD  gabapentin (NEURONTIN) 300 MG capsule Take 1 capsule (300 mg total) by mouth 3 (three) times daily. 02/02/16   Kerman Passey, MD  ibuprofen (ADVIL,MOTRIN) 200 MG tablet Take 200 mg by mouth every 6 (six) hours as needed. Pt takes twice a day, am/pm    [provider]  omeprazole (PRILOSEC) 20 MG capsule Take 1 capsule (20 mg total) daily by mouth. 01/03/17   [name], Janit Bern, MD  potassium chloride (K-DUR) 10 MEQ tablet Take 1 tablet (10 mEq total) by mouth daily. 11/01/16   Ellyn Hack, MD  Saw Palmetto 1000 MG CAPS Take 1 capsule by mouth daily.    [provider]  vitamin B-12 (CYANOCOBALAMIN) 500 MCG tablet Take 500 mcg by mouth daily.    [provider]    Allergies  Allergen Reactions  . Oxycodone-Acetaminophen Other (See Comments)    Causes the pt to be very irritable, says he can take this  . Percocet [Oxycodone-Acetaminophen] Other (See Comments)    Reaction:  Irritability     Family History  Problem Relation Age of Onset  . Cancer Mother        spine  . Hypertension Mother   . Mental illness Mother   . Cancer Father        lung  . Asthma Father   . Allergic rhinitis Father   . Arthritis Father   . Tuberculosis Father   . Gout Brother   . Allergic rhinitis Brother     Social History Social History   Tobacco Use  . Smoking status: Former Smoker    Packs/day: 0.25    Years: 10.00    Pack years: 2.50    Types: Cigarettes, Cigars    Last attempt to quit: 10/18/1979    Years since quitting: 37.2  . Smokeless tobacco: Never Used  Substance Use Topics  . Alcohol use: No  . Drug use: No    Review of Systems Constitutional: Negative for fever. Cardiovascular: Negative for chest pain. Respiratory: Negative for shortness of breath. Gastrointestinal: Positive for  epigastric burning and nausea. Genitourinary: Negative for dysuria All other ROS negative  ____________________________________________   PHYSICAL EXAM:  VITAL SIGNS: ED Triage Vitals  Enc Vitals Group     BP 01/03/17 2005 (!) 149/84     Pulse Rate 01/03/17 2005 69     Resp 01/03/17 2005 20     Temp 01/03/17 2005 97.7 F (36.5 C)     Temp Source 01/03/17 2005 Oral     SpO2 01/03/17 2005 100 %     Weight 01/03/17 2003 200 lb (90.7 kg)     Height 01/03/17 2003 6' (1.829 m)     Head Circumference --      Peak Flow --      Pain Score 01/03/17 2002 8     Pain Loc --      Pain Edu? --      Excl. in GC? --  Constitutional: Alert and oriented. Well appearing and in no distress. Eyes: Normal exam ENT   Head: Normocephalic and atraumatic.   Mouth/Throat: Mucous membranes are moist. Cardiovascular: Normal rate, regular rhythm.  Respiratory: Normal respiratory effort without tachypnea nor retractions. Breath sounds are clear  Gastrointestinal: Soft and nontender. No distention.  Musculoskeletal: Nontender with normal range of motion in all extremities.  Neurologic:  Normal speech and language. No gross focal neurologic deficits Skin:  Skin is warm, dry and intact.  Psychiatric: Mood and affect are normal.   ____________________________________________    EKG  EKG reviewed and interpreted by myself shows what appears to be a sinus rhythm around 67 bpm slightly widened QRS with a normal axis, slightly prolonged QTC with nonspecific ST changes including T wave inversions in the inferolateral leads. When compared to prior EKGs but this is largely unchanged.  ____________________________________________   INITIAL IMPRESSION / ASSESSMENT AND PLAN / ED COURSE  Pertinent labs & imaging results that were available during my care of the patient were reviewed by me and considered in my medical decision making (see chart for details).  Patient presents to the emergency  department for what he considers to be gastric reflux, epigastric burning for the past 3 or 4 days.  Differential would include gastric reflux, pancreatitis, gallbladder disease, gastritis or gastroenteritis as well as ACS.  Patient's workup including troponin is largely within normal limits.  Patient does have a slight lipase elevation at 62 however this is largely unchanged from previous lipase.  Patient denies any alcohol or drug use.  Patient has slight epigastric tenderness on examination otherwise a benign abdominal exam.  Overall the patient appears extremely well.  His symptoms are very suggestive of gastric reflux including a burning sensation with occasional bitter taste in his mouth.  The timeline does correlate well when the patient stopped taking his gastric reflux medication approximately 5 or 6 days ago.  We will does a GI cocktail as well as Protonix and monitor for symptom improvement while in the emergency department.  Patient agreeable to this plan of care.  Patient states moderate improvement in the burning sensation after GI cocktail.  We will discharge with Protonix and liquid Maalox.  I discussed my normal abdominal pain return precautions with the patient.  He is agreeable to this plan.  I reviewed the patient's records including multiple ER visits as well as a discharge summary from 10/27/16 for an end STEMI.  ____________________________________________   FINAL CLINICAL IMPRESSION(S) / ED DIAGNOSES  Epigastric pain Gastric reflux    Minna AntisPaduchowski, Arvis Zwahlen, MD 01/03/17 2226

## 2017-01-03 NOTE — Patient Outreach (Signed)
[facility] Care Management  01/03/2017  Irma Newnessrthur [facility] [birth year] [id number]    Received phone call from [facility] , [facility] LCSW, requested to call patient due to patient having concern regarding a medication.   Placed call to patient, he discussed concern regarding cost of over the counter acid reflux pill costing too much, and that he used to have a prescription and it cost less.   Placed call to [name] office spoke to representative  discuss patient request for  Omeprazole prescription [facility], she will send message to [name]  Placed return call to patient to update , will update assigned care manager by in basket message.   Egbert GaribaldiKimberly Saaya Procell, RN, [facility] Care [facility],Care Management Coordinator  [phone]- Mobile [phone]- Toll Free Main Office

## 2017-01-03 NOTE — ED Triage Notes (Signed)
Pt arrives to ED via [facility] from home with c/o "acid reflux". Pt reports h/x of same, pt reports not taking medications r/x'd for GERD d/t issues with pharmacy. Pt reports nausea, but denies vomiting. Pt is A&O, in NAD; RR even, regular, and unlabored; skin color/temp is WNL.

## 2017-01-03 NOTE — Telephone Encounter (Signed)
Copied from CRM [phone]. Topic: Quick Communication - See Telephone Encounter >> Jan 03, 2017  4:13 PM Cipriano BunkerLambe, Annette S wrote: CRM for notification. See Telephone encounter for:  Patient - [name] is requesting a prescription for [name].  It is costing too much for over the counter  01/03/17.

## 2017-01-17 ENCOUNTER — Other Ambulatory Visit: Payer: Self-pay | Admitting: Licensed Clinical Social Worker
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
## 2017-01-18 ENCOUNTER — Other Ambulatory Visit: Payer: Self-pay | Admitting: *Deleted

## 2017-01-18 NOTE — Patient Outreach (Signed)
Successful telephone encounter to [employer] Hedstrom, 82 year old male as RN CM left a voice message,pt called right back.   Spoke with pt, HIPAA identifiers verified, discussed purpose of call- follow up on current clinical status, recent ED visit 01/03/17 for Gastroesophageal reflux disease.   Pt reports taking  Prilosec (prescribed by PCP)- has one left,not working, did not get  Protonix filled.  (presribed at ED visit).  Pt reports taking OTC reflux medication suggested by local pharmacist, does not know the name, helping.  Pt reports plans to call PCP, needs a refill on Prilosec, let her know not working/need new medication. Pt reports has no appetite dealing with  Heartburn, has oatmeal, down to 204 lbs.   Pt inquired of RN CM next PCP visit to which relayed is 02/24/17.  Pt reports grandson does cook, but works a lot (16-17 hrs a day sometimes).  Pt reports continues to have problems with feet- pain sharp/burning, Aleve helps the pain.  Pt reports still have edema in right lower leg/does not elevate like he should, a little edema in left leg.    Plan:  As discussed with pt, plan to follow up again in 2 weeks, check on clinical status.  RN CM to determine then if home visit needed.            Reinforced with pt to call PCP, report current reflux medication not working.   [name]   Pierzchala RN CCM [facility] Care Management  [phone]

## 2017-01-20 ENCOUNTER — Ambulatory Visit: Payer: Self-pay | Admitting: Licensed Clinical Social Worker

## 2017-02-01 ENCOUNTER — Ambulatory Visit: Payer: Self-pay | Admitting: *Deleted

## 2017-02-01 ENCOUNTER — Other Ambulatory Visit: Payer: Self-pay | Admitting: *Deleted

## 2017-02-01 NOTE — Patient Outreach (Addendum)
[facility] Care Management  02/01/2017  [name] [facility] [birth year] [id number]   Telephone follow up call   Unsuccessful telephone outreach call to patient, no answer, message states voicemail not setup, unable to leave a message.  Plan Will await return call ,and will  update assigned care manager of telephone outreach attempt.   Egbert GaribaldiKimberly Glover, RN, [facility] Care Management,Care Management Coordinator  [phone]- Mobile [phone]- Toll Free Main Office

## 2017-02-03 ENCOUNTER — Other Ambulatory Visit: Payer: Self-pay | Admitting: Licensed Clinical Social Worker
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
## 2017-02-24 ENCOUNTER — Ambulatory Visit: Payer: Self-pay | Admitting: Family Medicine

## 2017-02-24 ENCOUNTER — Other Ambulatory Visit: Payer: Self-pay | Admitting: *Deleted

## 2017-02-24 NOTE — Patient Outreach (Signed)
Successful telephone encounter for  [name], [id number] year old male,follow up on current clinical status.  Pt's history includes but not limited to Arteriosclerosis of coronary artery, Neuropathy, Arthritis, pulmonary embolism.  Spoke with pt, HIPAA identifiers verified.  Pt reports cancelled his appointment for tomorrow with Dr. Sherie DonLada, MD did not fill out rehab paperwork right, did not refill Prilosec.  Pt reports did not go for lab work today, continues with  swelling in leg. Pt reports nobody helps  [name].   Pt reports as far as medications, takes what I have  OTC, ran out  most of his medications/has not taken his BP medication in a long time.  RN CM discussed with pt importance of following up with MD, lab work,get  medications refilled to which pt yelling reports  he was not going back to MD, don't worry about his medications.  Informed pt this RN CM will be discharging him due to not  following up with MD, medication adherence.   Plan:  As discussed with pt, plan to [facility] from [facility] due to non adherence.             Plan to collaborate with [facility], [place] [facility] LCSW , inform of case closure.             Plan to inform Dr. Sherie DonLada of case closure.   [name] M.   Pierzchala RN CCM [facility]  [phone]

## 2017-02-25 ENCOUNTER — Encounter: Payer: Self-pay | Admitting: *Deleted

## 2017-02-27 ENCOUNTER — Emergency Department
Admission: EM | Admit: 2017-02-27 | Discharge: 2017-02-27 | Disposition: A | Discharge status: Home / Self Care | Payer: Medicare Other | Attending: Emergency Medicine | Admitting: Emergency Medicine

## 2017-02-27 ENCOUNTER — Emergency Department: Payer: Medicare Other

## 2017-02-27 ENCOUNTER — Other Ambulatory Visit: Payer: Self-pay

## 2017-02-27 DIAGNOSIS — J449 Chronic obstructive pulmonary disease, unspecified: Secondary | ICD-10-CM | POA: Diagnosis not present

## 2017-02-27 DIAGNOSIS — Z79899 Other long term (current) drug therapy: Secondary | ICD-10-CM | POA: Insufficient documentation

## 2017-02-27 DIAGNOSIS — I82431 Acute embolism and thrombosis of right popliteal vein: Secondary | ICD-10-CM

## 2017-02-27 DIAGNOSIS — I110 Hypertensive heart disease with heart failure: Secondary | ICD-10-CM | POA: Diagnosis not present

## 2017-02-27 DIAGNOSIS — Z87891 Personal history of nicotine dependence: Secondary | ICD-10-CM | POA: Insufficient documentation

## 2017-02-27 DIAGNOSIS — I2510 Atherosclerotic heart disease of native coronary artery without angina pectoris: Secondary | ICD-10-CM | POA: Diagnosis not present

## 2017-02-27 DIAGNOSIS — E1140 Type 2 diabetes mellitus with diabetic neuropathy, unspecified: Secondary | ICD-10-CM | POA: Diagnosis not present

## 2017-02-27 DIAGNOSIS — I509 Heart failure, unspecified: Secondary | ICD-10-CM | POA: Insufficient documentation

## 2017-02-27 DIAGNOSIS — M79605 Pain in left leg: Secondary | ICD-10-CM | POA: Diagnosis present

## 2017-02-27 DIAGNOSIS — Z951 Presence of aortocoronary bypass graft: Secondary | ICD-10-CM | POA: Insufficient documentation

## 2017-02-27 DIAGNOSIS — Z950 Presence of cardiac pacemaker: Secondary | ICD-10-CM | POA: Insufficient documentation

## 2017-02-27 LAB — BASIC METABOLIC PANEL
Anion gap: 7 (ref 5–15)
BUN: 10 mg/dL (ref 6–20)
CHLORIDE: 109 mmol/L (ref 101–111)
CO2: 23 mmol/L (ref 22–32)
CREATININE: 1.2 mg/dL (ref 0.61–1.24)
Calcium: 9.7 mg/dL (ref 8.9–10.3)
GFR, EST NON AFRICAN AMERICAN: 55 mL/min — AB (ref >60)
Glucose, Bld: 98 mg/dL (ref 65–99)
POTASSIUM: 4.1 mmol/L (ref 3.5–5.1)
SODIUM: 139 mmol/L (ref 135–145)

## 2017-02-27 LAB — CBC WITH DIFFERENTIAL/PLATELET
BASOS PCT: 1 %
Basophils Absolute: 0 10*3/uL (ref 0–0.1)
EOS ABS: 0.3 10*3/uL (ref 0–0.7)
EOS PCT: 6 %
HCT: 41.5 % (ref 40.0–52.0)
HEMOGLOBIN: 14 g/dL (ref 13.0–18.0)
Lymphocytes Relative: 18 %
Lymphs Abs: 0.9 10*3/uL — ABNORMAL LOW (ref 1.0–3.6)
MCH: 32.6 pg (ref 26.0–34.0)
MCHC: 33.9 g/dL (ref 32.0–36.0)
MCV: 96.3 fL (ref 80.0–100.0)
Monocytes Absolute: 0.6 10*3/uL (ref 0.2–1.0)
Monocytes Relative: 12 %
NEUTROS PCT: 63 %
Neutro Abs: 3.3 10*3/uL (ref 1.4–6.5)
PLATELETS: 166 10*3/uL (ref 150–440)
RBC: 4.31 MIL/uL — AB (ref 4.40–5.90)
RDW: 13.6 % (ref 11.5–14.5)
WBC: 5.2 10*3/uL (ref 3.8–10.6)

## 2017-02-27 LAB — TROPONIN I

## 2017-02-27 MED ORDER — APIXABAN 5 MG PO TABS: ORAL_TABLET | ORAL | 1 refills | Status: DC

## 2017-02-27 MED ORDER — APIXABAN 5 MG PO TABS
ORAL_TABLET | ORAL | Status: AC
  Administered 2017-02-27: 10 mg via ORAL
  Filled 2017-02-27: qty 2

## 2017-02-27 MED ORDER — GABAPENTIN 600 MG PO TABS
300.0000 mg | ORAL_TABLET | Freq: Once | ORAL | Status: AC
Start: 2017-02-27 — End: 2017-02-27
  Administered 2017-02-27: 300 mg via ORAL
  Filled 2017-02-27: qty 1

## 2017-02-27 MED ORDER — APIXABAN 5 MG PO TABS
10.0000 mg | ORAL_TABLET | Freq: Two times a day (BID) | ORAL | Status: DC
  Administered 2017-02-27: 10 mg via ORAL

## 2017-02-27 NOTE — ED Triage Notes (Signed)
Pt comes via [facility] with c/o bilateral foot pain  On and off for the past 6 months. Pt states feet have become cold to touch today. Pt is A&OX4, respirations even and unlabored at this time. Pt has swelling noted to right leg and foot. Bilateral legs  warm to touch. Slight coldness to left foot

## 2017-02-27 NOTE — ED Notes (Signed)
Patient was educated on medicine and the need to start medicine tomorrow and follow up with a PCP.

## 2017-02-27 NOTE — ED Notes (Signed)
Bilateral strong pedal pulses heard with doppler. Pt states tinging and burning in both feet and that sometimes he can't feel touch or sensation

## 2017-02-27 NOTE — ED Notes (Signed)
Patient transported to X-ray 

## 2017-02-27 NOTE — ED Provider Notes (Signed)
[facility] Emergency Department Provider Note  ____________________________________________  Time seen: Approximately 6:48 PM  I have reviewed the triage vital signs and the nursing notes.   HISTORY  Chief Complaint Foot Pain  Level 5 caveat:  Portions of the history and physical were unable to be obtained due to poor historian   HPI [name] is a 83 y.o. male with a history of diabetes, neuropathy, congestive heart failure, coronary artery disease, hypertension, hyperlipidemia presents for evaluation of bilateral leg pain. Patient reports that he has had pain on and off for 6 months. He has noted that his feet will become cold to the touch sometimes for several minutes at a time. This morning he complained that he was having more pain on his bilateral legs but right worse than left and also noted that his feet were very cold which prompted his visit to the emergency room. no chest pain, no shortness of breath, no fever, or chills, no nausea or vomiting.  Past Medical History:  Diagnosis Date  . Arthritis   . CAD (coronary artery disease)   . Congestive heart failure (CHF) (HCC) 06/14/2016   LVEF 40-45% 2016 echo; Dr. Darrold Junker  . COPD (chronic obstructive pulmonary disease) (HCC)   . Diabetes mellitus without complication (HCC)   . Family history of adverse reaction to anesthesia    mom seizures   . GERD (gastroesophageal reflux disease)   . History of blood clots in legs   . History of ischemic cardiomyopathy   . 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 Hyperlipidemia   . Hypertension   . Literacy level of illiterate   . Osteomyelitis (HCC)   . PN (peripheral neuropathy)     Patient Active Problem List   Diagnosis Date Noted  . Peripheral arterial occlusive disease (HCC) 11/24/2016  . Elevated troponin 10/25/2016  . Bilateral lower extremity edema 10/19/2016  . Diabetic foot ulcers (HCC) 07/08/2016  . Congestive heart failure (CHF) (HCC)  06/14/2016  . Medication monitoring encounter 06/14/2016  . Hyperthyroidism 12/04/2015  . Unsteady gait 12/04/2015  . 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 Abnormal serum protein electrophoresis 12/05/2014  . Pressure ulcer 11/15/2014  . Hyperproteinemia 11/02/2014  . Hypoalbuminemia 11/02/2014  . Arthritis 10/23/2014  . Arteriosclerosis of coronary artery 10/23/2014  . Diabetic foot ulcer associated with type 2 diabetes mellitus (HCC) 10/23/2014  . Amputation of finger of left hand 10/23/2014  . History of pulmonary embolism 10/23/2014  . Literacy problem 10/23/2014  . Umbilical hernia without obstruction or gangrene 10/23/2014  . Vitamin B12 deficiency 10/23/2014  . Neuropathy of lower extremity 10/23/2014  . History of osteomyelitis 10/23/2014  . Artificial cardiac pacemaker 08/10/2013  . H/O coronary artery bypass surgery 08/10/2013  . Benign prostatic hyperplasia with urinary obstruction 09/27/2012  . Abnormal prostate specific antigen 09/27/2012    Past Surgical History:  Procedure Laterality Date  . amputation 4th finger Left   . APPENDECTOMY    . CLAVICLE SURGERY  2012   open reduction and internal fixation of left clavicle  . CORONARY ARTERY BYPASS GRAFT  2002  . FOOT SURGERY    . IMPLANTABLE CARDIOVERTER DEFIBRILLATOR (ICD) GENERATOR CHANGE N/A 12/03/2015   Procedure: ICD GENERATOR CHANGE;  Surgeon: Marcina Millard, MD;  Location: [facility] ORS;  Service: Cardiovascular;  Laterality: N/A;  . PACEMAKER INSERTION      Prior to Admission medications   Medication Sig Start Date  End Date Taking? Authorizing Provider  acetaminophen (TYLENOL) 500 MG tablet Take 500 mg by mouth every 8 (eight) hours as needed.    [provider]  Acidophilus Lactobacillus CAPS Take 1 capsule by mouth 2 (two) times daily.    [provider]  alum & mag  hydroxide-simeth (MAALOX REGULAR STRENGTH) 200-200-20 MG/5ML suspension Take 30 mLs every 4 (four) hours as needed by mouth for indigestion or heartburn. 01/03/17   Minna AntisPaduchowski, Kevin, MD  apixaban (ELIQUIS) 5 MG TABS tablet Take 10 mg twice a day for 7 days and then 5mg  twice a day until your doctor tells you to stop. 02/27/17   Nita SickleVeronese, Marion, MD  aspirin EC 81 MG tablet Take 1 tablet (81 mg total) by mouth daily. 05/11/16   Kerman PasseyLada, Melinda P, MD  atorvastatin (LIPITOR) 40 MG tablet Take 1 tablet (40 mg total) by mouth at bedtime. 12/17/16   Kerman PasseyLada, Melinda P, MD  furosemide (LASIX) 20 MG tablet Take 1 tablet (20 mg total) by mouth daily. 11/01/16   Ellyn HackShah, Syed Asad A, MD  gabapentin (NEURONTIN) 300 MG capsule Take 1 capsule (300 mg total) by mouth 3 (three) times daily. 02/02/16   Kerman PasseyLada, Melinda P, MD  ibuprofen (ADVIL,MOTRIN) 200 MG tablet Take 200 mg by mouth every 6 (six) hours as needed. Pt takes twice a day, am/pm    [provider]  omeprazole (PRILOSEC) 20 MG capsule Take 1 capsule (20 mg total) daily by mouth. 01/03/17   [name], [name], MD  pantoprazole (PROTONIX) 40 MG tablet Take 1 tablet (40 mg total) daily by mouth. 01/03/17 01/03/18  Minna AntisPaduchowski, Kevin, MD  potassium chloride (K-DUR) 10 MEQ tablet Take 1 tablet (10 mEq total) by mouth daily. 11/01/16   Ellyn HackShah, Syed Asad A, MD  Saw Palmetto 1000 MG CAPS Take 1 capsule by mouth daily.    [provider]  vitamin B-12 (CYANOCOBALAMIN) 500 MCG tablet Take 500 mcg by mouth daily.    [provider]    Allergies Oxycodone-acetaminophen and Percocet [oxycodone-acetaminophen]  Family History  Problem Relation Age of Onset  . Cancer Mother        spine  . Hypertension Mother   . Mental illness Mother   . Cancer Father        lung  . Asthma Father   . Allergic rhinitis Father   . Arthritis Father   . Tuberculosis Father   . Gout Brother   . Allergic rhinitis Brother     Social History Social History    Tobacco Use  . Smoking status: Former Smoker    Packs/day: 0.25    Years: 10.00    Pack years: 2.50    Types: Cigarettes, Cigars    Last attempt to quit: 10/18/1979    Years since quitting: 37.3  . Smokeless tobacco: Never Used  Substance Use Topics  . Alcohol use: No  . Drug use: No    Review of Systems  Constitutional: Negative for fever. Eyes: Negative for visual changes. ENT: Negative for sore throat. Neck: No neck pain  Cardiovascular: Negative for chest pain. Respiratory: Negative for shortness of breath. Gastrointestinal: Negative for abdominal pain, vomiting or diarrhea. Genitourinary: Negative for dysuria. Musculoskeletal: Negative for back pain. + b/l leg pain Skin: Negative for rash. Neurological: Negative for headaches, weakness or numbness. Psych: No SI or HI  ____________________________________________   PHYSICAL EXAM:  VITAL SIGNS: ED Triage Vitals  Enc Vitals Group     BP 02/27/17 1602 (!) 154/75  Pulse Rate 02/27/17 1602 78     Resp 02/27/17 1602 18     Temp 02/27/17 1602 97.7 F (36.5 C)     Temp Source 02/27/17 1602 Oral     SpO2 02/27/17 1602 100 %     Weight 02/27/17 1602 205 lb (93 kg)     Height 02/27/17 1602 5\' 10"  (1.778 m)     Head Circumference --      Peak Flow --      Pain Score 02/27/17 1615 6     Pain Loc --      Pain Edu? --      Excl. in GC? --     Constitutional: Alert and oriented. Well appearing and in no apparent distress. HEENT:      Head: Normocephalic and atraumatic.         Eyes: Conjunctivae are normal. Sclera is non-icteric.       Mouth/Throat: Mucous membranes are moist.       Neck: Supple with no signs of meningismus. Cardiovascular: Regular rate and rhythm. No murmurs, gallops, or rubs. 2+ symmetrical distal pulses are present in all extremities. No JVD. Respiratory: Normal respiratory effort. Lungs are clear to auscultation bilaterally. No wheezes, crackles, or rhonchi.  Gastrointestinal: Soft, non  tender, and non distended with positive bowel sounds. No rebound or guarding. Musculoskeletal: asymmetric leg swelling with 2+ pitting edema in the right lower extremity and no edema on the left lower extremity, Palpable DP and PT pulses bilaterally, bilateral lower extremities are warm and well perfused. Neurologic: Normal speech and language. Face is symmetric. Moving all extremities. No gross focal neurologic deficits are appreciated. Skin: Skin is warm, dry and intact. No rash noted. Psychiatric: Mood and affect are normal. Speech and behavior are normal.  ____________________________________________   LABS (all labs ordered are listed, but only abnormal results are displayed)  Labs Reviewed  CBC WITH DIFFERENTIAL/PLATELET - Abnormal; Notable for the following components:      Result Value   RBC 4.31 (*)    Lymphs Abs 0.9 (*)    All other components within normal limits  BASIC METABOLIC PANEL - Abnormal; Notable for the following components:   GFR calc non Af Amer 55 (*)    All other components within normal limits  TROPONIN I   ____________________________________________  EKG  ED ECG REPORT I, Nita Sickle, the attending physician, personally viewed and interpreted this ECG.  Dual paced rhythm, rate of 80, prolonged QTC, right axis deviation, no ST elevations or depressions.  ____________________________________________  RADIOLOGY  Doppler US:   Positive deep venous thrombosis in the right popliteal vein. ____________________________________________   PROCEDURES  Procedure(s) performed: None Procedures Critical Care performed:  None ____________________________________________   INITIAL IMPRESSION / ASSESSMENT AND PLAN / ED COURSE  83 y.o. male with a history of diabetes, neuropathy, congestive heart failure, coronary artery disease, hypertension, hyperlipidemia presents for evaluation of bilateral leg pain and asymmetric leg swelling. Doppler ultrasound  positive for a new DVT in the right popliteal vein. The patient denies chest pain or shortness of breath, he has no tachycardia, no hypoxia, normal work of breathing with no suspicion at this time for pulmonary embolism. We'll start patient on Eliquis. patient has warm and well perfused bilateral lower extremities with an operable DP and PT pulses bilaterally and no evidence of ischemia. Patient will be referred back to his PCP for further management and close f/u. Discussed return precautions for any chest pain or shortness of breath.  As part of my medical decision making, I reviewed the following data within the electronic MEDICAL RECORD NUMBER Nursing notes reviewed and incorporated, Labs reviewed , EKG interpreted , Old EKG reviewed, Old chart reviewed, Radiograph reviewed , Notes from prior ED visits and [place] Controlled Substance Database    Pertinent labs & imaging results that were available during my care of the patient were reviewed by me and considered in my medical decision making (see chart for details).    ____________________________________________   FINAL CLINICAL IMPRESSION(S) / ED DIAGNOSES  Final diagnoses:  Acute deep vein thrombosis (DVT) of popliteal vein of right lower extremity (HCC)      NEW MEDICATIONS STARTED DURING THIS VISIT:  ED Discharge Orders        Ordered    apixaban (ELIQUIS) 5 MG TABS tablet     02/27/17 1939       Note:  This document was prepared using Dragon voice recognition software and may include unintentional dictation errors.    Nita Sickle, MD 02/27/17 [phone]

## 2017-03-01 ENCOUNTER — Other Ambulatory Visit: Payer: Self-pay | Admitting: Licensed Clinical Social Worker

## 2017-03-01 NOTE — Patient Outreach (Signed)
Assessment:  CSW spoke via phone on 03/01/17 with RN [name] regarding client status and needs. Client was recently at the hospital, received care at the hospital, and then returned home. Client was prescribed Eliquis, as ordered, to help client with blood clot in client's leg. Also client was encouraged to have appointment with primary doctor, Dr. Sherie Pittman, in near future.  Kenneth Mourningose Pierzchala RN and CSW spoke of the above information regarding client needs.  CSW spoke with client on 03/01/17 about above information. CSW encouraged client to see primary doctor, Dr. Sherie Pittman, soon as recommended. CSW spoke with [name] about his use of Eliquis as prescribed.  Client said he has prescribed Eliquis and is taking Eliquis as prescribed.  Client said he is thinking about switching primary care doctors.  He said a doctor is located near his home He said his grandson plans to take client to doctor near client's home to see if that doctor is taking new clients..   CSW encouraged client to call CSW at [phone] as needed to discuss social work needs of client.     Plan:  Client to communicate with CSW in next 30 days to discuss community resources of assistance to client.  CSW to call client in 3 weeks to assess client needs at that time.  [name] MSW, LCSW Licensed Clinical Social Worker [facility] Care [facility] [phone]

## 2017-03-07 ENCOUNTER — Other Ambulatory Visit: Payer: Self-pay | Admitting: Family Medicine

## 2017-03-08 ENCOUNTER — Ambulatory Visit: Payer: Self-pay | Admitting: Family Medicine

## 2017-03-09 ENCOUNTER — Other Ambulatory Visit: Payer: Self-pay | Admitting: Licensed Clinical Social Worker

## 2017-03-09 NOTE — Patient Outreach (Signed)
Assessment:  CSW spoke via phone with client. CSW verified client identity. CSW received verbal permission from client on 03/09/17 for CSW to speak with client about current client needs and status. CSW has been talking with client about client visit with primary care doctor. CSW has encouraged client to attend appointment of client with primary care doctor, Dr. Sherie DonLada.  Client missed his last appointment with Dr. Sherie DonLada. Client said he was trying to go to appointment with Dr. Sherie DonLada this week. Client said he planned to talk with Dr. Sherie DonLada at next appointment about medication refills for client. Client said he had been taking Eliquis as prescribed. CSW did call RN Laretta AlstromRose Pierczhala on 03/09/17 to update Rose on client needs.  Rose said she would call client to discuss client needs.  Client and CSW have spoken about client family support. Client said he receives some support from his grandson.  Client continues to have financial challenges and some transport needs. His grandson does take client to buy groceries regularly. Local church minister sometimes arranges transport help for client to go to and from client's medical appointments. CSW has talked with client about client care plan. CSW have encouraged [name] to communicate with CSW in next 30 days to discuss community resources of assistance for client.  CSW encouraged client to call CSW at [phone] as needed to discuss social work needs of client.  CSW thanked client for phone call with CSW on 03/09/17.   Plan:  Client to communicate with CSW in next 30 days to discuss community resources of assistance for client.  CSW to call client in 3 weeks to assess client needs at that time.  Kenneth PillarMichael S.Torrez Pittman MSW, LCSW Licensed Clinical Social Worker [facility] Care Management [phone]

## 2017-03-10 ENCOUNTER — Ambulatory Visit: Payer: Self-pay | Admitting: Licensed Clinical Social Worker

## 2017-03-10 ENCOUNTER — Ambulatory Visit (INDEPENDENT_AMBULATORY_CARE_PROVIDER_SITE_OTHER): Payer: Medicare Other | Admitting: Family Medicine

## 2017-03-10 ENCOUNTER — Other Ambulatory Visit: Payer: Self-pay | Admitting: *Deleted

## 2017-03-10 ENCOUNTER — Encounter: Payer: Self-pay | Admitting: Family Medicine

## 2017-03-10 VITALS — BP 146/68 | HR 76 | Temp 98.0°F | Resp 16 | Wt 206.6 lb

## 2017-03-10 DIAGNOSIS — E785 Hyperlipidemia, unspecified: Secondary | ICD-10-CM | POA: Insufficient documentation

## 2017-03-10 DIAGNOSIS — E11621 Type 2 diabetes mellitus with foot ulcer: Secondary | ICD-10-CM | POA: Diagnosis not present

## 2017-03-10 DIAGNOSIS — I824Y1 Acute embolism and thrombosis of unspecified deep veins of right proximal lower extremity: Secondary | ICD-10-CM | POA: Diagnosis not present

## 2017-03-10 DIAGNOSIS — K219 Gastro-esophageal reflux disease without esophagitis: Secondary | ICD-10-CM | POA: Insufficient documentation

## 2017-03-10 DIAGNOSIS — R509 Fever, unspecified: Secondary | ICD-10-CM

## 2017-03-10 DIAGNOSIS — I5022 Chronic systolic (congestive) heart failure: Secondary | ICD-10-CM

## 2017-03-10 DIAGNOSIS — R059 Cough, unspecified: Secondary | ICD-10-CM

## 2017-03-10 DIAGNOSIS — K429 Umbilical hernia without obstruction or gangrene: Secondary | ICD-10-CM

## 2017-03-10 DIAGNOSIS — R05 Cough: Secondary | ICD-10-CM | POA: Diagnosis not present

## 2017-03-10 DIAGNOSIS — L97421 Non-pressure chronic ulcer of left heel and midfoot limited to breakdown of skin: Secondary | ICD-10-CM | POA: Diagnosis not present

## 2017-03-10 LAB — CBC WITH DIFFERENTIAL/PLATELET
BASOS ABS: 37 {cells}/uL (ref 0–200)
Basophils Relative: 0.5 %
EOS PCT: 7.1 %
Eosinophils Absolute: 518 cells/uL — ABNORMAL HIGH (ref 15–500)
HCT: 40.8 % (ref 38.5–50.0)
Hemoglobin: 14.1 g/dL (ref 13.2–17.1)
Lymphs Abs: 971 cells/uL (ref 850–3900)
MCH: 31.7 pg (ref 27.0–33.0)
MCHC: 34.6 g/dL (ref 32.0–36.0)
MCV: 91.7 fL (ref 80.0–100.0)
MONOS PCT: 11.4 %
MPV: 11.5 fL (ref 7.5–12.5)
NEUTROS ABS: 4942 {cells}/uL (ref 1500–7800)
NEUTROS PCT: 67.7 %
PLATELETS: 213 10*3/uL (ref 140–400)
RBC: 4.45 10*6/uL (ref 4.20–5.80)
RDW: 12.5 % (ref 11.0–15.0)
Total Lymphocyte: 13.3 %
WBC mixed population: 832 cells/uL (ref 200–950)
WBC: 7.3 10*3/uL (ref 3.8–10.8)

## 2017-03-10 LAB — POCT INFLUENZA A/B
INFLUENZA A, POC: NEGATIVE
INFLUENZA B, POC: NEGATIVE

## 2017-03-10 MED ORDER — BENZONATATE 100 MG PO CAPS: 100.0000 mg | ORAL_CAPSULE | Freq: Three times a day (TID) | ORAL | 0 refills | Status: DC | PRN

## 2017-03-10 NOTE — Progress Notes (Signed)
BP (!) 146/68 (BP Location: Left Arm, Patient Position: Sitting)   Pulse 76   Temp 98 F (36.7 C) (Oral)   Resp 16   Wt 206 lb 9.6 oz (93.7 kg)   SpO2 98%   BMI 29.64 kg/m    Subjective:    Patient ID: [name], male    DOB: 1934/04/04, 83 y.o.   MRN: [id number]  HPI: [name] is a 83 y.o. male  Chief Complaint  Patient presents with  . Medication Refill  . Influenza    body aches, running nose, cough, short breath    HPI Patient is here for medicine refills  However, he also thinks he may have the flu; he has runny nose, body aches, cough, shortness of breath; symptoms started Monday (this is Thursday afternoon); he has not tried any medicines; fevers and chills, burning up; yellow phlegm; no rash; sinus congestion; ears are congested  He has a new right leg DVT; on Eliquis; no bleeding; ER note reviewed US Venous Img Lower Bilateral 02/27/17  IMPRESSION: Positive deep venous thrombosis in the right popliteal vein.   Electronically Signed   By: Sherian Rein M.D.   On: 02/27/2017 18:33  CHF; supposed to be seeing Dr. Darrold Junker; not weighing himself; not using salt  Type 2 diabetes; last A1c 5.9 on 11/24/16  High cholesterol; LDL was 73 on 11/24/16   Depression screen [facility] 2/9 03/10/2017 11/24/2016 10/25/2016 10/20/2016 05/11/2016  Decreased Interest 0 0 1 0 0  Down, Depressed, Hopeless 0 0 1 0 0  PHQ - 2 Score 0 0 2 0 0  Altered sleeping - - 0 - -  Tired, decreased energy - - 1 - -  Change in appetite - - 0 - -  Feeling bad or failure about yourself  - - 0 - -  Trouble concentrating - - 0 - -  Moving slowly or fidgety/restless - - 1 - -  Suicidal thoughts - - 0 - -  PHQ-9 Score - - 4 - -  Difficult doing work/chores - - Somewhat difficult - -    Relevant past medical, surgical, family and social history reviewed Past Medical History:  Diagnosis Date  . Arthritis   . CAD (coronary artery disease)   . Congestive heart failure (CHF) (HCC)  06/14/2016   LVEF 40-45% 2016 echo; Dr. Darrold Junker  . COPD (chronic obstructive pulmonary disease) (HCC)   . Diabetes mellitus without complication (HCC)   . Family history of adverse reaction to anesthesia    mom seizures   . GERD (gastroesophageal reflux disease)   . History of blood clots in legs   . History of ischemic cardiomyopathy   . 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 Hyperlipidemia   . Hypertension   . Literacy level of illiterate   . Osteomyelitis (HCC)   . PN (peripheral neuropathy)    Past Surgical History:  Procedure Laterality Date  . amputation 4th finger Left   . APPENDECTOMY    . CLAVICLE SURGERY  2012   open reduction and internal fixation of left clavicle  . CORONARY ARTERY BYPASS GRAFT  2002  . FOOT SURGERY    . IMPLANTABLE CARDIOVERTER DEFIBRILLATOR (ICD) GENERATOR CHANGE N/A 12/03/2015   Procedure: ICD GENERATOR CHANGE;  Surgeon: Marcina Millard, MD;  Location: [facility] ORS;  Service: Cardiovascular;  Laterality: N/A;  . PACEMAKER INSERTION     Family History  Problem Relation Age of Onset  . Cancer Mother  spine  . Hypertension Mother   . Mental illness Mother   . Cancer Father        lung  . Asthma Father   . Allergic rhinitis Father   . Arthritis Father   . Tuberculosis Father   . Gout Brother   . Allergic rhinitis Brother    Social History   Tobacco Use  . Smoking status: Former Smoker    Packs/day: 0.25    Years: 10.00    Pack years: 2.50    Types: Cigarettes, Cigars    Last attempt to quit: 10/18/1979    Years since quitting: 37.4  . Smokeless tobacco: Never Used  Substance Use Topics  . Alcohol use: No  . Drug use: No   Interim medical history since last visit reviewed. Allergies and medications reviewed  Review of Systems  Constitutional: Positive for chills and fever.  HENT: Positive for congestion and rhinorrhea.   Respiratory: Positive for cough (yellow) and shortness of breath.   Gastrointestinal: Negative for blood  in stool.  Genitourinary: Negative for hematuria.  Skin: Negative for rash.  Hematological: Does not bruise/bleed easily.   Per HPI unless specifically indicated above     Objective:    BP (!) 146/68 (BP Location: Left Arm, Patient Position: Sitting)   Pulse 76   Temp 98 F (36.7 C) (Oral)   Resp 16   Wt 206 lb 9.6 oz (93.7 kg)   SpO2 98%   BMI 29.64 kg/m   Wt Readings from Last 3 Encounters:  03/10/17 206 lb 9.6 oz (93.7 kg)  02/27/17 205 lb (93 kg)  01/03/17 200 lb (90.7 kg)    Physical Exam  Constitutional: He appears well-developed and well-nourished. No distress.  Weight gain 6+ pounds in 2+ months  Neck: No JVD present.  Cardiovascular: Normal rate and regular rhythm.  Pulmonary/Chest: Effort normal and breath sounds normal.  Abdominal: He exhibits no distension. A hernia (umbilical, soft, reducible) is present.  Musculoskeletal: He exhibits edema (1+ pitting edema on the LEFT, RIGHT leg larger than the left also with 1+ pitting).       Hands: Status post amputation of left ring finger distal to the PIP  Neurological:  Dense neuropathy of both lower extremities almost to the knees  Psychiatric: He has a normal mood and affect.  talkative   Diabetic Foot Form - Detailed   Diabetic Foot Exam - detailed Diabetic Foot exam was performed with the following findings:  Yes 03/15/2017  1:33 PM  Pulse Foot Exam completed.:  Yes  Right Dorsalis Pedis:  Diminished Left Dorsalis Pedis:  Diminished  Sensory Foot Exam Completed.:  Yes Semmes-Weinstein Monofilament Test R Site 1-Great Toe:  Neg L Site 1-Great Toe:  Neg    Comments:  Dense neuropathy both feet       Assessment & Plan:   Problem List Items Addressed This Visit      Cardiovascular and Mediastinum   Congestive heart failure (CHF) (HCC)    Encouraged pt to f/u with heart doctor and avoid salt and weigh daily; call Dr. Darrold JunkerParaschos if weight gain 2 pounds overnight or 5 pounds over a week      Right leg DVT  (HCC)    Continue blood thinner        Endocrine   Diabetic foot ulcer associated with type 2 diabetes mellitus (HCC) (Chronic)    Right foot is healed; left foot under the ball of the foot has <1 cm ulcerated callus; patient done  with wound clinic; patient to keep close on it and notify us if not healing        Other   Umbilical hernia without obstruction or gangrene    Stable, reducible       Other Visit Diagnoses    Fever and chills    -  Primary   Relevant Orders   POCT Influenza A/B (Completed)   CBC with Differential/Platelet (Completed)   Cough       Relevant Orders   CBC with Differential/Platelet (Completed)   DG Chest 2 View       Follow up plan: Return in about 3 months (around 06/08/2017) for follow-up visit with Dr. Sherie Don.  An after-visit summary was printed and given to the patient at check-out.  Please see the patient instructions which may contain other information and recommendations beyond what is mentioned above in the assessment and plan.  Meds ordered this encounter  Medications  . benzonatate (TESSALON PERLES) 100 MG capsule    Sig: Take 1 capsule (100 mg total) by mouth every 8 (eight) hours as needed for cough.    Dispense:  30 capsule    Refill:  0    Orders Placed This Encounter  Procedures  . DG Chest 2 View  . CBC with Differential/Platelet  . POCT Influenza A/B

## 2017-03-10 NOTE — Assessment & Plan Note (Signed)
Right foot is healed; left foot under the ball of the foot has <1 cm ulcerated callus; patient done with wound clinic; patient to keep close on it and notify us if not healing

## 2017-03-10 NOTE — Patient Outreach (Signed)
03/10/17   [name] 06/27/1934 [id number]  Unsuccessful telephone encounter to [name], 83 year old male follow up on current clinical status.  This  RN CM [facility] yesterday providing an update on client's needs.   Scott reports  Spoke with pt yesterday and pt is willing to follow up with Dr. Sherie DonLada , needs refills on several medications.    Scott reports pt is compliant with taking Eliquis, prescribed after recent ED visit )02/27/17 acute DVT).   This RN  CM discharged pt 02/24/17 from [facility] due to pt not willing to follow up with PCP, medication  Adherence.  Unable to leave a voice message on pt's phone - no voice message set up.    View in EMR  See pt has an appointment to follow up with Dr. Sherie DonLada today.    Plan:  RN CM to follow up again with pt tomorrow telephonically, check on outcome of PCP visit, assess  For community CM needs.   [name]   Manaal Mandala RN CCM [facility]  [phone]

## 2017-03-10 NOTE — Patient Instructions (Addendum)
Please have the xray done across the street If you have not heard anything from my staff in a week about any orders/referrals/studies from today, please contact us here to follow-up [phone] Keep a close eye on the feet Do see Dr. Darrold Junker regularly Weigh yourself every day, and call him or go to the ER if you gain 2 pounds overnight or gain 5 pounds in a week   Heart doctor             Phone: [phone]  Heart Failure Heart failure means your heart has trouble pumping blood. This makes it hard for your body to work well. Heart failure is usually a long-term (chronic) condition. You must take good care of yourself and follow your doctor's treatment plan. Follow these instructions at home:  Take your heart medicine as told by your doctor. ? Do not stop taking medicine unless your doctor tells you to. ? Do not skip any dose of medicine. ? Refill your medicines before they run out. ? Take other medicines only as told by your doctor or pharmacist.  Stay active if told by your doctor. The elderly and people with severe heart failure should talk with a doctor about physical activity.  Eat heart-healthy foods. Choose foods that are without trans fat and are low in saturated fat, cholesterol, and salt (sodium). This includes fresh or frozen fruits and vegetables, fish, lean meats, fat-free or low-fat dairy foods, whole grains, and high-fiber foods. Lentils and dried peas and beans (legumes) are also good choices.  Limit salt if told by your doctor.  Cook in a healthy way. Roast, grill, broil, bake, poach, steam, or stir-fry foods.  Limit fluids as told by your doctor.  Weigh yourself every morning. Do this after you pee (urinate) and before you eat breakfast. Write down your weight to give to your doctor.  Take your blood pressure and write it down if your doctor tells you to.  Ask your doctor how to check your pulse. Check your pulse as told.  Lose weight if told by your  doctor.  Stop smoking or chewing tobacco. Do not use gum or patches that help you quit without your doctor's approval.  Schedule and go to doctor visits as told.  Nonpregnant women should have no more than 1 drink a day. Men should have no more than 2 drinks a day. Talk to your doctor about drinking alcohol.  Stop illegal drug use.  Stay current with shots (immunizations).  Manage your health conditions as told by your doctor.  Learn to manage your stress.  Rest when you are tired.  If it is really hot outside: ? Avoid intense activities. ? Use air conditioning or fans, or get in a cooler place. ? Avoid caffeine and alcohol. ? Wear loose-fitting, lightweight, and light-colored clothing.  If it is really cold outside: ? Avoid intense activities. ? Layer your clothing. ? Wear mittens or gloves, a hat, and a scarf when going outside. ? Avoid alcohol.  Learn about heart failure and get support as needed.  Get help to maintain or improve your quality of life and your ability to care for yourself as needed. Contact a doctor if:  You gain weight quickly.  You are more short of breath than usual.  You cannot do your normal activities.  You tire easily.  You cough more than normal, especially with activity.  You have any or more puffiness (swelling) in areas such as your hands, feet, ankles, or belly (  abdomen).  You cannot sleep because it is hard to breathe.  You feel like your heart is beating fast (palpitations).  You get dizzy or light-headed when you stand up. Get help right away if:  You have trouble breathing.  There is a change in mental status, such as becoming less alert or not being able to focus.  You have chest pain or discomfort.  You faint. This information is not intended to replace advice given to you by your health care provider. Make sure you discuss any questions you have with your health care provider. Document Released: 11/11/2007 Document  Revised: 07/10/2015 Document Reviewed: 03/20/2012 Elsevier Interactive Patient Education  2017 [employer]

## 2017-03-10 NOTE — Assessment & Plan Note (Signed)
Encouraged pt to f/u with heart doctor and avoid salt and weigh daily; call Dr. Darrold JunkerParaschos if weight gain 2 pounds overnight or 5 pounds over a week

## 2017-03-10 NOTE — Assessment & Plan Note (Signed)
Stable, reducible 

## 2017-03-11 ENCOUNTER — Other Ambulatory Visit: Payer: Self-pay | Admitting: *Deleted

## 2017-03-13 ENCOUNTER — Encounter: Payer: Self-pay | Admitting: *Deleted

## 2017-03-13 NOTE — Patient Outreach (Signed)
03/11/17   Successful telephone encounter to Roney MarionArthur Konkle, 83 year old male for follow up on clinical status, recent PCP visit.   This RN CM received an update on pt  from [facility]- reports pt was willing to follow up with Dr. Sherie DonLada again, view in EMR pt saw PCP 03/10/16.  Spoke with pt, HIPAA identifiers verified.  Pt reports saw PCP yesterday, refills called into pharmacy by MD plus was given some new medications.   Pt reports he is  Taking all of his medications as ordered, picked  all  new medications, Eliquis refilled which has been taking bid for his DVT.   RN CM discussed with pt doing a home visit next week to which pt agreed.    Plan:  RN CM to start back providing  pt with [facility] services short term.            RN CM to inform Dr. Sherie DonLada to provide pt with Community CM services  Short term.            As discussed with pt, plan to follow up again next week- home visit.   [name]   Md Smola RN CCM [facility]  [phone]

## 2017-03-15 DIAGNOSIS — I82401 Acute embolism and thrombosis of unspecified deep veins of right lower extremity: Secondary | ICD-10-CM | POA: Insufficient documentation

## 2017-03-15 NOTE — Assessment & Plan Note (Signed)
Continue blood thinner.

## 2017-03-17 ENCOUNTER — Other Ambulatory Visit: Payer: Self-pay

## 2017-03-17 ENCOUNTER — Other Ambulatory Visit: Payer: Self-pay | Admitting: *Deleted

## 2017-03-17 ENCOUNTER — Telehealth: Payer: Self-pay | Admitting: Family Medicine

## 2017-03-17 NOTE — Patient Outreach (Signed)
[facility]) Care Management   03/17/2017  [name] [birth year] [id number]  [name] is an 83 y.o. male  Subjective:  Pt reports on visit with PCP last week, brought all his medications in for MD  To review/ fill but found some of his medications not called into pharmacy, Gabapentin  Being one of them.    Pt reports has neuropathy in his legs, pain occurs at night- burning, cold, has been taking 4 tablets of Ibuprofen (200 mg tablets) Bid which is helping.   Pt reports continues to take the Eliquis as ordered  for his  DVT, still has swelling in right lower leg, support stockings help, did not put on today.    Objective:   Vitals:   03/17/17 1521  BP: (!) 110/58  Pulse: 66  Resp: (!) 24  SpO2: 97%    ROS  Physical Exam  Constitutional: He is oriented to person, place, and time. He appears well-developed and well-nourished.  Cardiovascular: Normal rate, regular rhythm and normal heart sounds.  Respiratory: Effort normal and breath sounds normal.  GI: Soft. Bowel sounds are normal.  Musculoskeletal: Normal range of motion. He exhibits edema.  Trace edema noted top of left foot. +2 edema right lower extremity/top of foot..   Neurological: He is alert and oriented to person, place, and time.  Skin: Skin is warm and dry.  Psychiatric: He has a normal mood and affect. His behavior is normal. Judgment and thought content normal.    Encounter Medications:   Outpatient Encounter Medications as of 03/17/2017  Medication Sig Note  . apixaban (ELIQUIS) 5 MG TABS tablet Take 10 mg twice a day for 7 days and then 5mg  twice a day until your doctor tells you to stop.   [name] atorvastatin (LIPITOR) 40 MG tablet Take 1 tablet (40 mg total) by mouth at bedtime.   . benzonatate (TESSALON PERLES) 100 MG capsule Take 1 capsule (100 mg total) by mouth every 8 (eight) hours as needed for cough.   . furosemide (LASIX) 20 MG tablet Take 1 tablet (20 mg total) by mouth  daily.   [name] ibuprofen (ADVIL,MOTRIN) 200 MG tablet Take 200 mg by mouth every 6 (six) hours as needed. Pt takes twice a day, am/pm   . Omega-3 Fatty Acids (FISH OIL) 1000 MG CAPS Take 1,000 mg by mouth daily.   [name] omeprazole (PRILOSEC) 20 MG capsule Take 1 capsule (20 mg total) daily by mouth.   . potassium chloride (K-DUR) 10 MEQ tablet Take 1 tablet (10 mEq total) by mouth daily.   [name] acetaminophen (TYLENOL) 500 MG tablet Take 500 mg by mouth every 8 (eight) hours as needed.   . Acidophilus Lactobacillus CAPS Take 1 capsule by mouth 2 (two) times daily.   [name] alum & mag hydroxide-simeth (MAALOX REGULAR STRENGTH) 200-200-20 MG/5ML suspension Take 30 mLs every 4 (four) hours as needed by mouth for indigestion or heartburn. (Patient not taking: Reported on 03/10/2017)   . aspirin EC 81 MG tablet Take 1 tablet (81 mg total) by mouth daily. (Patient not taking: Reported on 03/17/2017)   . gabapentin (NEURONTIN) 300 MG capsule Take 1 capsule (300 mg total) by mouth 3 (three) times daily. (Patient not taking: Reported on 03/17/2017)   . pantoprazole (PROTONIX) 40 MG tablet Take 1 tablet (40 mg total) daily by mouth. (Patient not taking: Reported on 03/10/2017)   . Saw Palmetto, Serenoa repens, (SAW PALMETTO PO) Take 1 capsule by mouth daily.    [name]  vitamin B-12 (CYANOCOBALAMIN) 500 MCG tablet Take 500 mcg by mouth daily. 03/17/2017: Per pt ran out.    No facility-administered encounter medications on file as of 03/17/2017.     Functional Status:   In your present state of health, do you have any difficulty performing the following activities: 03/10/2017 11/24/2016  Hearing? N N  Vision? N N  Difficulty concentrating or making decisions? N Y  Walking or climbing stairs? N Y  Dressing or bathing? N N  Doing errands, shopping? N Y  Quarry managerreparing Food and eating ? - -  Using the Toilet? - -  In the past six months, have you accidently leaked urine? - -  Do you have problems with loss of bowel control? - -   Managing your Medications? - -  Managing your Finances? - -  Housekeeping or managing your Housekeeping? - -  Some recent data might be hidden    Fall/Depression Screening:    Fall Risk  03/10/2017 11/24/2016 10/25/2016  Falls in the past year? Yes No Yes  Number falls in past yr: 1 - 2 or more  Injury with Fall? Yes - No  Risk Factor Category  - - High Fall Risk  Risk for fall due to : - - History of fall(s);Impaired balance/gait;Impaired mobility  Follow up - - Falls prevention discussed   PHQ 2/9 Scores 03/10/2017 11/24/2016 10/25/2016 10/20/2016 05/11/2016 04/30/2016 12/19/2015  PHQ - 2 Score 0 0 2 0 0 0 0  PHQ- 9 Score - - 4 - - - -    Assessment:   83 year old gentleman, resides with grandson.  Pt's history includes but not limited  To Neuropathy to legs, pacemaker, GERD, Right DVT (diagnosed recent ED visit 02/27/17), CHF.    Neuropathy- per pt no pain currently, occurs at night.     Medications- reviewed with pt, informing pt use of Ibuprofen (per pt taking eight 200 mg daily)     With Eliquis is risk for bleeding, use of Tylenol better.  Also during visit, pt's pharmacy delivered     Naproxen to which relayed this medication with  Eliquis also risk for bleeding.  Informed pt      Vitamin B 12 and Saw Palmetto can be obtained OTC, no prescription needed.      As requested by pt,  to call PCP office, inquire about MD filling prescription for Neurontin.    CHF- lungs clear, +2 edema to right lower leg, per pt compliant with Lasix and Potassium.      Per recent visit with PCP/note- pt needs follow up with Heart MD.   RN CM called Dr. Darrold JunkerParaschos        Office- find out pt's next visit, was informed pt missed October visit as well as January-      Pacemaker check.  Appointment made for pt to follow up 03/31/17 with Heart MD- relayed       To pt, agreed to go.     Plan:  RN CM called PCP's office during visit- spoke with Marcos EkeSharamare, relayed  pt requesting MD      Call in prescription for  Gabapentin. Also to inform MD pt has been taking 8 tablets of Ibuprofen     Bid with his Eliquis, advised to switch to Tylenol.              As discussed with pt, to follow up with Dr. Darrold JunkerParaschos 03/31/17.  As discussed with pt, plan to follow up again next week, check on status.            RN CM to send Dr. Sherie Don home visit encounter.                  [facility] CM Care Plan Problem One     Most Recent Value  Care Plan Problem One  DVT- recent ED visit   Role Documenting the Problem One  Care Management Coordinator  Care Plan for Problem One  Active  [facility] Long Term Goal   Pt will have no issues with DVT in the next 33 days   Interventions for Problem One Long Term Goal  Discussed with pt ongoing adherence with medication, elevated legs/support stockings   [facility] CM Short Term Goal #1   Re established- pt will take all medications as ordered for the next 30 days   [facility] CM Short Term Goal #1 Start Date  03/17/17  Interventions for Short Term Goal #1  Medications reviewed, discussed with pt use of Ibuprophen with Eliquis, PCP office called about pt getting refill on Gabapentin     Opal Dinning M.   Keyira Mondesir RN CCM [facility] Care Management  [phone]

## 2017-03-17 NOTE — Telephone Encounter (Signed)
Gabapentin had not been prescribed since 2017; patient was not taking it as of our last visit Denied

## 2017-03-17 NOTE — Telephone Encounter (Signed)
Copied from CRM [phone]. Topic: Quick Communication - See Telephone Encounter >> Mar 17, 2017  2:59 PM Guinevere FerrariMorris, Adamaris King E, NT wrote: CRM for notification. See Telephone encounter for: Okey DupreRose called and wanted to let the doctor know that the patient was taking ibuprofen and Acidophilus Lactobacillus CAPS at the same time. She said she advised patient not to take these medications together because patient was taking over the amount not realizing it.   03/17/17.

## 2017-03-17 NOTE — Telephone Encounter (Signed)
Copied from CRM [phone]. Topic: Quick Communication - Rx Refill/Question >> Mar 17, 2017  2:57 PM Guinevere FerrariMorris, Graciela Plato E, NT wrote: Medication: gabapentin (NEURONTIN) 300 MG capsule   Has the patient contacted their pharmacy? Yes   (Agent: If no, request that the patient contact the pharmacy for the refill.)  [facility] [place], [place] - [address] [phone] (Phone) [phone] (Fax)   Preferred Pharmacy (with phone number or street name):    Agent: Please be advised that RX refills may take up to 3 business days. We ask that you follow-up with your pharmacy.

## 2017-03-21 ENCOUNTER — Other Ambulatory Visit: Payer: Self-pay

## 2017-03-21 ENCOUNTER — Emergency Department: Payer: Medicare Other

## 2017-03-21 ENCOUNTER — Telehealth: Payer: Self-pay | Admitting: Family Medicine

## 2017-03-21 ENCOUNTER — Emergency Department
Admission: EM | Admit: 2017-03-21 | Discharge: 2017-03-21 | Disposition: A | Discharge status: Home / Self Care | Payer: Medicare Other | Attending: Emergency Medicine | Admitting: Emergency Medicine

## 2017-03-21 ENCOUNTER — Encounter: Payer: Self-pay | Admitting: Emergency Medicine

## 2017-03-21 DIAGNOSIS — Z95810 Presence of automatic (implantable) cardiac defibrillator: Secondary | ICD-10-CM | POA: Diagnosis not present

## 2017-03-21 DIAGNOSIS — I2510 Atherosclerotic heart disease of native coronary artery without angina pectoris: Secondary | ICD-10-CM | POA: Insufficient documentation

## 2017-03-21 DIAGNOSIS — G5602 Carpal tunnel syndrome, left upper limb: Secondary | ICD-10-CM | POA: Diagnosis not present

## 2017-03-21 DIAGNOSIS — I509 Heart failure, unspecified: Secondary | ICD-10-CM | POA: Insufficient documentation

## 2017-03-21 DIAGNOSIS — E0590 Thyrotoxicosis, unspecified without thyrotoxic crisis or storm: Secondary | ICD-10-CM | POA: Diagnosis not present

## 2017-03-21 DIAGNOSIS — Z951 Presence of aortocoronary bypass graft: Secondary | ICD-10-CM | POA: Diagnosis not present

## 2017-03-21 DIAGNOSIS — Z79899 Other long term (current) drug therapy: Secondary | ICD-10-CM | POA: Insufficient documentation

## 2017-03-21 DIAGNOSIS — I110 Hypertensive heart disease with heart failure: Secondary | ICD-10-CM | POA: Insufficient documentation

## 2017-03-21 DIAGNOSIS — Z7901 Long term (current) use of anticoagulants: Secondary | ICD-10-CM | POA: Diagnosis not present

## 2017-03-21 DIAGNOSIS — E119 Type 2 diabetes mellitus without complications: Secondary | ICD-10-CM | POA: Insufficient documentation

## 2017-03-21 DIAGNOSIS — J449 Chronic obstructive pulmonary disease, unspecified: Secondary | ICD-10-CM | POA: Insufficient documentation

## 2017-03-21 DIAGNOSIS — M79642 Pain in left hand: Secondary | ICD-10-CM | POA: Diagnosis present

## 2017-03-21 DIAGNOSIS — Z87891 Personal history of nicotine dependence: Secondary | ICD-10-CM | POA: Insufficient documentation

## 2017-03-21 NOTE — Telephone Encounter (Signed)
Please have him schedule an appt

## 2017-03-21 NOTE — ED Provider Notes (Signed)
[facility] Emergency Department Provider Note  ____________________________________________   First MD Initiated Contact with Patient 03/21/17 1415     (approximate)  I have reviewed the triage vital signs and the nursing notes.   HISTORY  Chief Complaint Hand Pain  HPI [name] is a 83 y.o. male is here with complaint of sensation of needles and pins in his left hand since last night.  Patient is unaware of any injury.  He states he had similar symptoms approximately 50 years ago and this resolved without any medication or therapy.  Patient is right-handed and denies any repetitive use of his left hand recently.  He rates his pain as a 10/10 at this time.   Past Medical History:  Diagnosis Date  . Arthritis   . CAD (coronary artery disease)   . Congestive heart failure (CHF) (HCC) 06/14/2016   LVEF 40-45% 2016 echo; Dr. Darrold Junker  . COPD (chronic obstructive pulmonary disease) (HCC)   . Diabetes mellitus without complication (HCC)   . Family history of adverse reaction to anesthesia    mom seizures   . GERD (gastroesophageal reflux disease)   . History of blood clots in legs   . History of ischemic cardiomyopathy   . 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 Hyperlipidemia   . Hypertension   . Literacy level of illiterate   . Osteomyelitis (HCC)   . PN (peripheral neuropathy)     Patient Active Problem List   Diagnosis Date Noted  . Right leg DVT (HCC) 03/15/2017  . GERD (gastroesophageal reflux disease) 03/10/2017  . Hyperlipidemia, unspecified 03/10/2017  . Peripheral arterial occlusive disease (HCC) 11/24/2016  . Bilateral lower extremity edema 10/19/2016  . Diabetic foot ulcers (HCC) 07/08/2016  . Congestive heart failure (CHF) (HCC) 06/14/2016  . Medication monitoring encounter 06/14/2016  . Hyperthyroidism 12/04/2015  . Unsteady gait 12/04/2015  . 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 Abnormal serum protein electrophoresis 12/05/2014  . Pressure ulcer 11/15/2014  . Hyperproteinemia 11/02/2014  . Hypoalbuminemia 11/02/2014  . Arthritis 10/23/2014  . Arteriosclerosis of coronary artery 10/23/2014  . Diabetic foot ulcer associated with type 2 diabetes mellitus (HCC) 10/23/2014  . Amputation of finger of left hand 10/23/2014  . History of pulmonary embolism 10/23/2014  . Literacy problem 10/23/2014  . Umbilical hernia without obstruction or gangrene 10/23/2014  . Neuropathy of lower extremity 10/23/2014  . History of osteomyelitis 10/23/2014  . Artificial cardiac pacemaker 08/10/2013  . H/O coronary artery bypass surgery 08/10/2013  . GIB (gastrointestinal bleeding) 07/18/2013  . Benign prostatic hyperplasia with urinary obstruction 09/27/2012  . Abnormal prostate specific antigen 09/27/2012  . Benign localized hyperplasia of prostate with urinary obstruction and other lower urinary tract symptoms (LUTS)(600.21) 09/27/2012  . Pulmonary embolism (HCC) 08/15/2012  . Cardiomyopathy (HCC) 08/09/2011    Past Surgical History:  Procedure Laterality Date  . amputation 4th finger Left   . APPENDECTOMY    . CLAVICLE SURGERY  2012   open reduction and internal fixation of left clavicle  . CORONARY ARTERY BYPASS GRAFT  2002  . FOOT SURGERY    . IMPLANTABLE CARDIOVERTER DEFIBRILLATOR (ICD) GENERATOR CHANGE N/A 12/03/2015   Procedure: ICD GENERATOR CHANGE;  Surgeon: Marcina Millard, MD;  Location: [facility] ORS;  Service: Cardiovascular;  Laterality: N/A;  . PACEMAKER INSERTION      Prior to Admission medications   Medication Sig Start Date End Date Taking? Authorizing  Provider  acetaminophen (TYLENOL) 500 MG tablet Take 500 mg by mouth every 8 (eight) hours as needed.    [provider]  Acidophilus Lactobacillus CAPS Take 1 capsule by mouth 2 (two) times daily.    [provider]  apixaban (ELIQUIS) 5 MG TABS tablet Take 10 mg twice a day for 7 days and then 5mg  twice a day until your doctor tells you to stop. 02/27/17   Nita SickleVeronese, Cash[place], MD  aspirin EC 81 MG tablet Take 1 tablet (81 mg total) by mouth daily. Patient not taking: Reported on 03/17/2017 05/11/16   Kerman PasseyLada, Melinda P, MD  atorvastatin (LIPITOR) 40 MG tablet Take 1 tablet (40 mg total) by mouth at bedtime. 12/17/16   Kerman PasseyLada, Melinda P, MD  furosemide (LASIX) 20 MG tablet Take 1 tablet (20 mg total) by mouth daily. 03/07/17   Kerman PasseyLada, Melinda P, MD  ibuprofen (ADVIL,MOTRIN) 200 MG tablet Take 200 mg by mouth every 6 (six) hours as needed. Pt takes twice a day, am/pm    [provider]  Omega-3 Fatty Acids (FISH OIL) 1000 MG CAPS Take 1,000 mg by mouth daily.    [provider]  omeprazole (PRILOSEC) 20 MG capsule Take 1 capsule (20 mg total) daily by mouth. 01/03/17   [name], [name], MD  pantoprazole (PROTONIX) 40 MG tablet Take 1 tablet (40 mg total) daily by mouth. Patient not taking: Reported on 03/10/2017 01/03/17 01/03/18  Minna AntisPaduchowski, Kevin, MD  potassium chloride (K-DUR) 10 MEQ tablet Take 1 tablet (10 mEq total) by mouth daily. 03/07/17   Kerman PasseyLada, Melinda P, MD  Saw Palmetto, Serenoa repens, (SAW PALMETTO PO) Take 1 capsule by mouth daily.     [provider]  vitamin B-12 (CYANOCOBALAMIN) 500 MCG tablet Take 500 mcg by mouth daily.    [provider]    Allergies Oxycodone-acetaminophen and Percocet [oxycodone-acetaminophen]  Family History  Problem Relation Age of Onset  . Cancer Mother        spine  . Hypertension Mother   . Mental illness Mother   . Cancer Father        lung  . Asthma Father   . Allergic rhinitis Father   . Arthritis Father   . Tuberculosis Father   . Gout Brother   . Allergic rhinitis Brother     Social History Social History   Tobacco Use  . Smoking status: Former Smoker    Packs/day: 0.25    Years: 10.00    Pack years:  2.50    Types: Cigarettes, Cigars    Last attempt to quit: 10/18/1979    Years since quitting: 37.4  . Smokeless tobacco: Never Used  Substance Use Topics  . Alcohol use: No  . Drug use: No    Review of Systems Constitutional: No fever/chills Cardiovascular: Denies chest pain. Respiratory: Denies shortness of breath. Musculoskeletal: Positive for left hand pain. Skin: Negative for rash. Neurological: Negative for headaches, focal weakness or numbness.  ___________________________________________   PHYSICAL EXAM:  VITAL SIGNS: ED Triage Vitals  Enc Vitals Group     BP 03/21/17 1340 (!) 153/78     Pulse Rate 03/21/17 1340 71     Resp 03/21/17 1340 20     Temp 03/21/17 1340 97.8 F (36.6 C)     Temp Source 03/21/17 1340 Oral     SpO2 03/21/17 1340 98 %     Weight 03/21/17 1341 206 lb (93.4 kg)     Height 03/21/17 1341 5\' 10"  (  1.778 m)     Head Circumference --      Peak Flow --      Pain Score 03/21/17 1339 10     Pain Loc --      Pain Edu? --      Excl. in GC? --    Constitutional: Alert and oriented. Well appearing and in no acute distress. Eyes: Conjunctivae are normal.  Head: Atraumatic. Neck: No stridor.   Cardiovascular: Normal rate, regular rhythm. Grossly normal heart sounds.  Good peripheral circulation. Respiratory: Normal respiratory effort.  No retractions. Lungs CTAB. Musculoskeletal: On examination of left hand and wrist there is no gross deformity and no soft tissue swelling.  Patient is able to flex and extend with minimal limitation secondary to discomfort.  Patient is able to move all digits without any restriction and no edema is noted of the digits.  Skin is intact.  No ecchymosis or erythema present.  There is a surgical amputation that is well healed of the fourth digit.  Tinel sign is questionably positive.  Patient states that there is shooting-like sensation reproduced into his thumb, index and third fingers.  Capillary refill is less than 3 seconds.   Skin is warm and dry. Neurologic:  Normal speech and language. No gross focal neurologic deficits are appreciated.  Skin:  Skin is warm, dry and intact.  As noted above. Psychiatric: Mood and affect are normal. Speech and behavior are normal.  ____________________________________________   LABS (all labs ordered are listed, but only abnormal results are displayed)  Labs Reviewed - No data to display  RADIOLOGY  ED MD interpretation: No acute injury is noted.  Official radiology report(s): Dg Hand Complete Left  Result Date: 03/21/2017 CLINICAL DATA:  "Pins and needles" to the left hand.  No injury. EXAM: LEFT HAND - COMPLETE 3+ VIEW COMPARISON:  None. FINDINGS: No acute fracture or malalignment. Prior amputation of the ring finger at the level of the mid middle phalanx. Scattered mild osteoarthritis involving the DIP joints and first CMC joint. Bone mineralization is normal. Soft tissues are unremarkable. IMPRESSION: No acute osseous abnormality. Electronically Signed   By: Obie Dredge M.D.   On: 03/21/2017 14:40    ____________________________________________   PROCEDURES  Procedure(s) performed: None  Procedures  Critical Care performed: No  ____________________________________________   INITIAL IMPRESSION / ASSESSMENT AND PLAN / ED COURSE Patient was placed in a cockup wrist splint for support of him bending his wrist.  At this time he is being treated for carpal tunnel syndrome by history.  He was made aware that his hand x-ray did not show any bony abnormalities.  Patient still continues to take Eliquis therefore no anti-inflammatories were prescribed.  He is encouraged to follow-up with his PCP if any continued problems. ____________________________________________   FINAL CLINICAL IMPRESSION(S) / ED DIAGNOSES  Final diagnoses:  Carpal tunnel syndrome of left wrist     ED Discharge Orders    None       Note:  This document was prepared using Dragon  voice recognition software and may include unintentional dictation errors.    Tommi Rumps, PA-C 03/21/17 1544    Emily Filbert, MD 03/22/17 [phone]

## 2017-03-21 NOTE — Telephone Encounter (Signed)
What is his foot med?

## 2017-03-21 NOTE — ED Notes (Signed)
See triage note  Presents with "pins and needles" to left hand  Denies any injury  No deformity  States has similar episodes with his feet in past

## 2017-03-21 NOTE — Telephone Encounter (Signed)
Pt came in requesting to be seen today for hand pain. Due to everyone being completely booked I advised him to go the urgent care or the ER. Pt stated that we are not right because we are not able to help him. He then questioned why had we not called in his prescription especially the one for his foot. I saw a message in his chart but was not able to read it therefore I informed him that a nurse would have to contact him once they returned from lunch. He then stated that he has been without his meds and he really need them. He again said that we are not right

## 2017-03-21 NOTE — Telephone Encounter (Signed)
Tried contacting patient voice mail did not pick up therefore I was not able to leave a message. Will try again tomorrow.

## 2017-03-21 NOTE — Discharge Instructions (Signed)
Follow-up with your primary care provider for any further testing of your left hand and also to reevaluate your medications.  Wear wrist splint for support.  Elevate wrist when possible.

## 2017-03-21 NOTE — ED Triage Notes (Signed)
States feeling of pins and needles in L hand since during the night last night.

## 2017-03-22 ENCOUNTER — Ambulatory Visit: Payer: Self-pay | Admitting: Licensed Clinical Social Worker

## 2017-03-22 NOTE — Telephone Encounter (Signed)
Tried contacting patient again no response. Voice mail did not pick up therefore I was not able to leave a message. Will try again tomorrow.

## 2017-03-23 ENCOUNTER — Telehealth: Payer: Self-pay | Admitting: Family Medicine

## 2017-03-23 NOTE — Telephone Encounter (Signed)
Not on pt's current med list and I don't see were you have prescribed but has gotten fro ER.  Can you prescribe?

## 2017-03-23 NOTE — Telephone Encounter (Signed)
Copied from CRM [phone]. Topic: Quick Communication - Rx Refill/Question >> Mar 17, 2017  2:57 PM Guinevere FerrariMorris, Sharamare E, NT wrote: Medication: gabapentin (NEURONTIN) 300 MG capsule   Has the patient contacted their pharmacy? Yes   (Agent: If no, request that the patient contact the pharmacy for the refill.)  [facility] - [place], [place] - [address] [phone] (Phone) [phone] (Fax)   Preferred Pharmacy (with phone number or street name):    Agent: Please be advised that RX refills may take up to 3 business days. We ask that you follow-up with your pharmacy. >> Mar 23, 2017 11:04 AM Waymon AmatoBurton, Donna F wrote: Pt is checking on status of his request of his neurontin-pt states he is in a lot of pain and went to the hospital for one hand and now the pain is going to the other hand and has burning in is feet   Best number [phone] [name] -pt grandson
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
## 2017-03-24 ENCOUNTER — Ambulatory Visit (INDEPENDENT_AMBULATORY_CARE_PROVIDER_SITE_OTHER): Payer: Medicare Other | Admitting: Family Medicine

## 2017-03-24 ENCOUNTER — Encounter: Payer: Self-pay | Admitting: Family Medicine

## 2017-03-24 VITALS — BP 124/76 | HR 81 | Temp 97.6°F | Resp 16 | Wt 199.9 lb

## 2017-03-24 DIAGNOSIS — I824Y1 Acute embolism and thrombosis of unspecified deep veins of right proximal lower extremity: Secondary | ICD-10-CM

## 2017-03-24 DIAGNOSIS — G5603 Carpal tunnel syndrome, bilateral upper limbs: Secondary | ICD-10-CM

## 2017-03-24 DIAGNOSIS — E0590 Thyrotoxicosis, unspecified without thyrotoxic crisis or storm: Secondary | ICD-10-CM

## 2017-03-24 DIAGNOSIS — G5793 Unspecified mononeuropathy of bilateral lower limbs: Secondary | ICD-10-CM | POA: Diagnosis not present

## 2017-03-24 MED ORDER — WRIST BRACE/RIGHT LARGE MISC: 0 refills | Status: AC

## 2017-03-24 MED ORDER — GABAPENTIN 300 MG PO CAPS: ORAL_CAPSULE | ORAL | 0 refills | Status: DC

## 2017-03-24 MED ORDER — TRUFORM STOCKINGS 20-30MMHG MISC: 0 refills | Status: AC

## 2017-03-24 NOTE — Assessment & Plan Note (Signed)
Recheck TSH and free T4 

## 2017-03-24 NOTE — Progress Notes (Signed)
BP 124/76   Pulse 81   Temp 97.6 F (36.4 C) (Oral)   Resp 16   Wt 199 lb 14.4 oz (90.7 kg)   SpO2 97%   BMI 28.68 kg/m    Subjective:    Patient ID: [name], male    DOB: [birth year], 83 y.o.   MRN: [id number]  HPI: [name] is a 83 y.o. male  Chief Complaint  Patient presents with  . Follow-up  . Medication Refill    HPI He has been having problems with pain in the hand; he has tried rubbing his hand with a brush The pain was bothersome and he got upset He went to the ER on 03/21/17; diagnosed with carpal tunnel Type 2 diabetes; last A1c 5.9 Profound neuropathy; gabapentin helps; wearing special hard soled shoes; does not go barefoot; adequate B12 per last labs; previous thyroid was off DVT; no bleeding on the blood thinner; taking PPI for protection, no stomach pain, no blood in the stool, he checks  Depression screen [facility] 2/9 03/10/2017 11/24/2016 10/25/2016 10/20/2016 05/11/2016  Decreased Interest 0 0 1 0 0  Down, Depressed, Hopeless 0 0 1 0 0  PHQ - 2 Score 0 0 2 0 0  Altered sleeping - - 0 - -  Tired, decreased energy - - 1 - -  Change in appetite - - 0 - -  Feeling bad or failure about yourself  - - 0 - -  Trouble concentrating - - 0 - -  Moving slowly or fidgety/restless - - 1 - -  Suicidal thoughts - - 0 - -  PHQ-9 Score - - 4 - -  Difficult doing work/chores - - Somewhat difficult - -    Relevant past medical, surgical, family and social history reviewed Past Medical History:  Diagnosis Date  . Arthritis   . CAD (coronary artery disease)   . Congestive heart failure (CHF) (HCC) 06/14/2016   LVEF 40-45% 2016 echo; Dr. Darrold Junker  . COPD (chronic obstructive pulmonary disease) (HCC)   . Diabetes mellitus without complication (HCC)   . Family history of adverse reaction to anesthesia    mom seizures   . GERD (gastroesophageal reflux disease)   . History of blood clots in legs   . History of ischemic cardiomyopathy   . 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 Hyperlipidemia   . Hypertension   . Literacy level of illiterate   . Osteomyelitis (HCC)   . PN (peripheral neuropathy)    Past Surgical History:  Procedure Laterality Date  . amputation 4th finger Left   . APPENDECTOMY    . CLAVICLE SURGERY  2012   open reduction and internal fixation of left clavicle  . CORONARY ARTERY BYPASS GRAFT  2002  . FOOT SURGERY    . IMPLANTABLE CARDIOVERTER DEFIBRILLATOR (ICD) GENERATOR CHANGE N/A 12/03/2015   Procedure: ICD GENERATOR CHANGE;  Surgeon: Marcina Millard, MD;  Location: [facility] ORS;  Service: Cardiovascular;  Laterality: N/A;  . PACEMAKER INSERTION     Family History  Problem Relation Age of Onset  . Cancer Mother        spine  . Hypertension Mother   . Mental illness Mother   . Cancer Father        lung  . Asthma Father   . Allergic rhinitis Father   . Arthritis Father   . Tuberculosis Father   . Gout Brother   . Allergic rhinitis Brother    Social History   Tobacco Use  .  Smoking status: Former Smoker    Packs/day: 0.25    Years: 10.00    Pack years: 2.50    Types: Cigarettes, Cigars    Last attempt to quit: 10/18/1979    Years since quitting: 37.4  . Smokeless tobacco: Never Used  Substance Use Topics  . Alcohol use: No  . Drug use: No    Interim medical history since last visit reviewed. Allergies and medications reviewed  Review of Systems Per HPI unless specifically indicated above     Objective:    BP 124/76   Pulse 81   Temp 97.6 F (36.4 C) (Oral)   Resp 16   Wt 199 lb 14.4 oz (90.7 kg)   SpO2 97%   BMI 28.68 kg/m   Wt Readings from Last 3 Encounters:  03/24/17 199 lb 14.4 oz (90.7 kg)  03/21/17 206 lb (93.4 kg)  03/17/17 206 lb (93.4 kg)    Physical Exam  Constitutional: He appears well-developed and well-nourished. No distress.  Weight gain 6+ pounds in 2+ months  Neck: No JVD present.  Cardiovascular: Normal rate and regular rhythm.  Pulmonary/Chest: Effort normal and breath  sounds normal.  Abdominal: He exhibits no distension. A hernia (umbilical, soft, reducible) is present.  Musculoskeletal: He exhibits edema (1+ pitting edema on the LEFT, RIGHT leg larger than the left also with 1+ pitting).       Hands: Status post amputation of left ring finger distal to the PIP  Neurological:  Dense neuropathy of both lower extremities almost to the knees  Psychiatric: He has a normal mood and affect.  talkative   Diabetic Foot Form - Detailed   Diabetic Foot Exam - detailed Diabetic Foot exam was performed with the following findings:  Yes 03/28/2017  2:14 PM  Visual Foot Exam completed.:  Yes  Pulse Foot Exam completed.:  Yes  Right Dorsalis Pedis:  Diminished Left Dorsalis Pedis:  Diminished  Sensory Foot Exam Completed.:  Yes Semmes-Weinstein Monofilament Test R Site 1-Great Toe:  Neg L Site 1-Great Toe:  Neg    Comments:  Dense peripheral neuropathy to half way up the shins        Assessment & Plan:   Problem List Items Addressed This Visit      Cardiovascular and Mediastinum   Right leg DVT (HCC)    Continue blood thinner; start on compression stockings        Endocrine   Hyperthyroidism    Recheck TSH and free T4      Relevant Orders   TSH + free T4 (Completed)     Nervous and Auditory   Neuropathy of lower extremity (Chronic)    Check labs today; do not go barefoot; start back on gabapentin, taper up every 3 days to one TID      Relevant Orders   Serum protein electrophoresis with reflex   Protein Electrophoresis,Random Urn (Completed)   Kappa/lambda light chains (Completed)   Beta 2 microglobulin, serum (Completed)    Other Visit Diagnoses    Carpal tunnel syndrome, bilateral    -  Primary   wear braces and start back on gabapentin       Follow up plan: No Follow-up on file.  An after-visit summary was printed and given to the patient at check-out.  Please see the patient instructions which may contain other information and  recommendations beyond what is mentioned above in the assessment and plan.  Meds ordered this encounter  Medications  . Elastic Bandages &  Supports (WRIST BRACE/RIGHT LARGE) MISC    Sig: Wear on the right wrist, cock-up wrist splint for carpal tunnel    Dispense:  1 each    Refill:  0  . Elastic Bandages & Supports (TRUFORM STOCKINGS 20-30MMHG) MISC    Sig: Wear on both legs; put on first thing in the morning and remove before bed; do NOT sleep in them    Dispense:  2 each    Refill:  0    Orders Placed This Encounter  Procedures  . Serum protein electrophoresis with reflex  . Protein Electrophoresis,Random Urn  . Kappa/lambda light chains  . TSH + free T4  . Beta 2 microglobulin, serum   Threatening behavior by phone was addressed with patient today; advised that we cannot tolerate anything like that; future threats will result in dismissal

## 2017-03-24 NOTE — Telephone Encounter (Signed)
I called patient I explained that we are here to take care of him and I want to help him; asked what medicine he needs and I sent it I also told him I cannot tolerate any threats made toward staff of this office He said he was just upset I explained I need my staff to feel safe and won't tolerate threats, and if any further comments like that, we would have to let him go; he verbalized understanding Passing message on to office manager

## 2017-03-24 NOTE — Patient Instructions (Addendum)
Wear compression stockings; put them on in the morning and take them off at night; do NOT sleep in them We'll get labs today Try ice on your shoulder 15 minutes 3 times a day until better We'll see you in April, but call before your next appointment if you need us

## 2017-03-24 NOTE — Telephone Encounter (Signed)
Rx sent 

## 2017-03-24 NOTE — Assessment & Plan Note (Signed)
Check labs today; do not go barefoot; start back on gabapentin, taper up every 3 days to one TID

## 2017-03-24 NOTE — Assessment & Plan Note (Addendum)
Continue blood thinner; start on compression stockings

## 2017-03-25 ENCOUNTER — Ambulatory Visit: Payer: Self-pay | Admitting: *Deleted

## 2017-03-25 ENCOUNTER — Encounter: Payer: Self-pay | Admitting: Family Medicine

## 2017-03-25 DIAGNOSIS — R7989 Other specified abnormal findings of blood chemistry: Secondary | ICD-10-CM | POA: Insufficient documentation

## 2017-03-25 DIAGNOSIS — R779 Abnormality of plasma protein, unspecified: Secondary | ICD-10-CM | POA: Insufficient documentation

## 2017-03-28 ENCOUNTER — Other Ambulatory Visit: Payer: Self-pay | Admitting: Licensed Clinical Social Worker

## 2017-03-28 ENCOUNTER — Telehealth: Payer: Self-pay | Admitting: Family Medicine

## 2017-03-28 ENCOUNTER — Other Ambulatory Visit: Payer: Self-pay

## 2017-03-28 ENCOUNTER — Encounter: Payer: Self-pay | Admitting: Family Medicine

## 2017-03-28 ENCOUNTER — Other Ambulatory Visit: Payer: Self-pay | Admitting: Family Medicine

## 2017-03-28 DIAGNOSIS — R7989 Other specified abnormal findings of blood chemistry: Secondary | ICD-10-CM

## 2017-03-28 DIAGNOSIS — R779 Abnormality of plasma protein, unspecified: Secondary | ICD-10-CM

## 2017-03-28 DIAGNOSIS — G5793 Unspecified mononeuropathy of bilateral lower limbs: Secondary | ICD-10-CM

## 2017-03-28 MED ORDER — POTASSIUM CHLORIDE ER 10 MEQ PO TBCR: 10.0000 meq | EXTENDED_RELEASE_TABLET | Freq: Every day | ORAL | 0 refills | Status: DC

## 2017-03-28 MED ORDER — FUROSEMIDE 20 MG PO TABS: 20.0000 mg | ORAL_TABLET | Freq: Every day | ORAL | 0 refills | Status: DC
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
## 2017-03-28 NOTE — Progress Notes (Signed)
[facility] las[facility] and KCl

## 2017-03-28 NOTE — Telephone Encounter (Signed)
Spoke with Kenneth FewScott Forrest, LCSW from [facility] and was informed that patient was making threats against our office [facility] on this morning.  Please see patient outreach phone note from [place] on today in regards to the threats being communicated.  Kenneth informed me that his office will not reach back to patient at this time.   I spoke with Dr. Sherie Pittman in regards to the threats [name] communicated with Kenneth CousinScott Forrest,LCSW in regards to our practice and we both agreed that patient will have to be discharged from our practice immediately and the police needed to be contacted.  I have  reached out the [place] [employer] [place] and informed them of the threats that the patient made on today.  I am currently awaiting to receive a call back from a police officer to discuss the next steps and to see if they will be able to send an officer to the patient's home (he currently lives with his grandson).  A dismissal letter has been drafted and will be mailed out certified to the patient on today.
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
## 2017-03-28 NOTE — Telephone Encounter (Signed)
Erroneous encounter

## 2017-03-29 ENCOUNTER — Telehealth: Payer: Self-pay | Admitting: Family Medicine

## 2017-03-29 NOTE — Telephone Encounter (Signed)
Copied from CRM #[id number]. Topic: Quick Communication - See Telephone Encounter >> Mar 29, 2017  2:53 PM Arlyss Gandyichardson, Helem Reesor N, NT wrote: CRM for notification. See Telephone encounter for: [facility] calling to double check that pt is aware of the referral to them for him to make an appt because they tried scheduling him before and the pt was "not very nice" and then told her that he was not being seen. She states she doesn't want that conflict again if this can be avoided. CB#: [phone]  03/29/17.

## 2017-03-29 NOTE — Telephone Encounter (Signed)
LVM for [name] informing her that we would send patient's referral to either [facility] or [facility] and if she had any further questions she can give our office a call.

## 2017-03-31 LAB — PROTEIN ELECTROPHORESIS, SERUM, WITH REFLEX
ALBUMIN ELP: 4.2 g/dL (ref 3.8–4.8)
ALPHA 1: 0.3 g/dL (ref 0.2–0.3)
Alpha 2: 0.6 g/dL (ref 0.5–0.9)
BETA 2: 0.8 g/dL — AB (ref 0.2–0.5)
Beta Globulin: 0.5 g/dL (ref 0.4–0.6)
Gamma Globulin: 1.7 g/dL (ref 0.8–1.7)
Total Protein: 8.1 g/dL (ref 6.1–8.1)

## 2017-03-31 LAB — PROTEIN ELECTROPHORESIS,RANDOM URN
ALBUMIN UR 24 HR ELECTRO: 69 %
Alpha-1-Globulin, U: 5 %
Alpha-2-Globulin, U: 6 %
Beta Globulin, U: 12 %
Creatinine, Urine: 101 mg/dL (ref 20–320)
Gamma Globulin, U: 8 %
Protein/Creat Ratio: 139 mg/g creat — ABNORMAL HIGH (ref 22–128)
Total Protein, Urine: 14 mg/dL (ref 5–25)

## 2017-03-31 LAB — KAPPA/LAMBDA LIGHT CHAINS
KAPPA LAMBDA RATIO: 1.3 (ref 0.26–1.65)
Kappa free light chain: 34.3 mg/L — ABNORMAL HIGH (ref 3.3–19.4)
Lambda Free Lght Chn: 26.4 mg/L — ABNORMAL HIGH (ref 5.7–26.3)

## 2017-03-31 LAB — IFE INTERPRETATION: IMMUNOFIX ELECTR INT: NOT DETECTED

## 2017-03-31 LAB — TSH+FREE T4: TSH W/REFLEX TO FT4: 0.5 mIU/L (ref 0.40–4.50)

## 2017-03-31 LAB — BETA 2 MICROGLOBULIN, SERUM: Beta-2 Microglobulin: 3.31 mg/L — ABNORMAL HIGH (ref <=2.5)

## 2017-04-04 ENCOUNTER — Telehealth: Payer: Self-pay | Admitting: Family Medicine

## 2017-04-04 NOTE — Telephone Encounter (Signed)
Note received from pharmacy that patient cannot afford the Eliquis, which I have never prescribed Patient has been discharged from our practice so I don't want to give medical advice

## 2017-04-04 NOTE — Telephone Encounter (Signed)
Patient was immediately discharged from practice with no 30 day window on 03/28/17.

## 2017-04-05 ENCOUNTER — Encounter: Payer: Self-pay | Admitting: Emergency Medicine

## 2017-04-05 ENCOUNTER — Encounter: Payer: Self-pay | Admitting: *Deleted

## 2017-04-05 ENCOUNTER — Emergency Department
Admission: EM | Admit: 2017-04-05 | Discharge: 2017-04-05 | Disposition: A | Discharge status: Home / Self Care | Payer: Medicare Other | Attending: Emergency Medicine | Admitting: Emergency Medicine

## 2017-04-05 ENCOUNTER — Other Ambulatory Visit: Payer: Self-pay

## 2017-04-05 ENCOUNTER — Other Ambulatory Visit: Payer: Self-pay | Admitting: *Deleted

## 2017-04-05 DIAGNOSIS — M79641 Pain in right hand: Secondary | ICD-10-CM | POA: Diagnosis not present

## 2017-04-05 DIAGNOSIS — Z87891 Personal history of nicotine dependence: Secondary | ICD-10-CM | POA: Insufficient documentation

## 2017-04-05 DIAGNOSIS — Z951 Presence of aortocoronary bypass graft: Secondary | ICD-10-CM | POA: Diagnosis not present

## 2017-04-05 DIAGNOSIS — I10 Essential (primary) hypertension: Secondary | ICD-10-CM | POA: Diagnosis not present

## 2017-04-05 DIAGNOSIS — M79642 Pain in left hand: Secondary | ICD-10-CM | POA: Diagnosis present

## 2017-04-05 DIAGNOSIS — J449 Chronic obstructive pulmonary disease, unspecified: Secondary | ICD-10-CM | POA: Insufficient documentation

## 2017-04-05 DIAGNOSIS — I2510 Atherosclerotic heart disease of native coronary artery without angina pectoris: Secondary | ICD-10-CM | POA: Diagnosis not present

## 2017-04-05 DIAGNOSIS — G629 Polyneuropathy, unspecified: Secondary | ICD-10-CM | POA: Diagnosis not present

## 2017-04-05 DIAGNOSIS — E119 Type 2 diabetes mellitus without complications: Secondary | ICD-10-CM | POA: Insufficient documentation

## 2017-04-05 DIAGNOSIS — Z95810 Presence of automatic (implantable) cardiac defibrillator: Secondary | ICD-10-CM | POA: Insufficient documentation

## 2017-04-05 MED ORDER — HYDROCODONE-ACETAMINOPHEN 5-325 MG PO TABS
2.0000 | ORAL_TABLET | Freq: Once | ORAL | Status: AC
  Administered 2017-04-05: 2 via ORAL
  Filled 2017-04-05: qty 2

## 2017-04-05 MED ORDER — TRAMADOL HCL 50 MG PO TABS: 50.0000 mg | ORAL_TABLET | Freq: Four times a day (QID) | ORAL | 0 refills | Status: DC | PRN

## 2017-04-05 NOTE — Patient Outreach (Signed)
Letter cancelled.     [name]   Pierzchala RN CCM [facility] Care Management  [phone]

## 2017-04-05 NOTE — ED Notes (Signed)
No protocols per dr Mayford Knifewilliams at this time.

## 2017-04-05 NOTE — Patient Outreach (Signed)
04/05/2017   Unsuccessful telephone encounter to [name], 83 year old male, follow up on status, ED visit today for  Neuropathy.   This RN CM was informed by [facility] LCS[facility]  Last week that pt was making [facility]   View in EMR dismissal letter was sent to pt by [facility].   View in EMR today for ED visit- pt was instructed to schedule appointment  With Dr.Lemont Morrisey in 2 days (04/07/2017) for recheck.   Unable to leave a voice message with pt as no voice message set up.    Plan:  RN CM to follow up again in next 2 days telephonically- check on  Status,see if pt scheduled appointment with new MD.       [name]   Pierzchala RN CCM [facility] Care Management  [phone]

## 2017-04-05 NOTE — ED Notes (Signed)
First Nurse Note:  Patient complaining of cold feet not allowing him to sleep last night and pain in his hands.

## 2017-04-05 NOTE — ED Triage Notes (Addendum)
Here for both feet being cold last night but are ok now.  Pt c/o that no matter where he goes no one figures out his problems.  Also c/o pain in finger tips of both hands, has braces on both hands for what he reports is carpel tunnel.  Has pain in both shoulders and lower back he would also like seen.  "people don't realize all the problems I have and they just don't help me". No fevers.  NAD. Ambulatory. Sometimes by reflux bothers me still at night.

## 2017-04-05 NOTE — ED Provider Notes (Addendum)
[facility] Emergency Department Provider Note       Time seen: ----------------------------------------- 10:03 AM on 04/05/2017 -----------------------------------------   I have reviewed the triage vital signs and the nursing notes.  HISTORY   Chief Complaint multiple complaints    HPI ALEXZANDER DOLINGER is a 83 y.o. male with a history of arthritis, coronary artery disease, COPD, diabetes, GERD, hyperlipidemia and hypertension who presents to the ED for bilateral hand and foot pain.  Patient states his feet were cold last night but they are okay now.  Patient takes medication for neuropathy but he states is not helping.  Complains of pain in the fingertips of both hands and he is wearing braces for both hands and both feet.  He also has pain in both shoulders.  Patient states people do not realize all the problems I have and they just do not help me.  He denies fevers, chills or other complaints.  Past Medical History:  Diagnosis Date  . Arthritis   . CAD (coronary artery disease)   . Congestive heart failure (CHF) (HCC) 06/14/2016   LVEF 40-45% 2016 echo; Dr. Darrold Junker  . COPD (chronic obstructive pulmonary disease) (HCC)   . Diabetes mellitus without complication (HCC)   . Family history of adverse reaction to anesthesia    mom seizures   . GERD (gastroesophageal reflux disease)   . History of blood clots in legs   . History of ischemic cardiomyopathy   . 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 Hyperlipidemia   . Hypertension   . Literacy level of illiterate   . Osteomyelitis (HCC)   . PN (peripheral neuropathy)     Patient Active Problem List   Diagnosis Date Noted  . Abnormality of plasma protein 03/25/2017  . Elevated beta-2 microglobulin 03/25/2017  . Right leg DVT (HCC) 03/15/2017  . GERD (gastroesophageal reflux disease) 03/10/2017  . Hyperlipidemia, unspecified 03/10/2017  . Peripheral arterial occlusive disease (HCC) 11/24/2016  .  Bilateral lower extremity edema 10/19/2016  . Diabetic foot ulcers (HCC) 07/08/2016  . Congestive heart failure (CHF) (HCC) 06/14/2016  . Medication monitoring encounter 06/14/2016  . Hyperthyroidism 12/04/2015  . Unsteady gait 12/04/2015  . 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 Abnormal serum protein electrophoresis 12/05/2014  . Pressure ulcer 11/15/2014  . Hyperproteinemia 11/02/2014  . Hypoalbuminemia 11/02/2014  . Arthritis 10/23/2014  . Arteriosclerosis of coronary artery 10/23/2014  . Diabetic foot ulcer associated with type 2 diabetes mellitus (HCC) 10/23/2014  . Amputation of finger of left hand 10/23/2014  . History of pulmonary embolism 10/23/2014  . Literacy problem 10/23/2014  . Umbilical hernia without obstruction or gangrene 10/23/2014  . Neuropathy of lower extremity 10/23/2014  . History of osteomyelitis 10/23/2014  . Artificial cardiac pacemaker 08/10/2013  . H/O coronary artery bypass surgery 08/10/2013  . GIB (gastrointestinal bleeding) 07/18/2013  . Benign prostatic hyperplasia with urinary obstruction 09/27/2012  . Abnormal prostate specific antigen 09/27/2012  . Benign localized hyperplasia of prostate with urinary obstruction and other lower urinary tract symptoms (LUTS)(600.21) 09/27/2012  . Pulmonary embolism (HCC) 08/15/2012  . Cardiomyopathy (HCC) 08/09/2011    Past Surgical History:  Procedure Laterality Date  . amputation 4th finger Left   . APPENDECTOMY    . CLAVICLE SURGERY  2012   open reduction and internal fixation of left clavicle  . CORONARY ARTERY BYPASS GRAFT  2002  . FOOT SURGERY    . IMPLANTABLE CARDIOVERTER  DEFIBRILLATOR (ICD) GENERATOR CHANGE N/A 12/03/2015   Procedure: ICD GENERATOR CHANGE;  Surgeon: Marcina Millard, MD;  Location: [facility] ORS;  Service: Cardiovascular;  Laterality: N/A;  . PACEMAKER INSERTION       Allergies Oxycodone-acetaminophen and Percocet [oxycodone-acetaminophen]  Social History Social History   Tobacco Use  . Smoking status: Former Smoker    Packs/day: 0.25    Years: 10.00    Pack years: 2.50    Types: Cigarettes, Cigars    Last attempt to quit: 10/18/1979    Years since quitting: 37.4  . Smokeless tobacco: Never Used  Substance Use Topics  . Alcohol use: No  . Drug use: No    Review of Systems Constitutional: Negative for fever. Cardiovascular: Negative for chest pain. Respiratory: Negative for shortness of breath. Gastrointestinal: Negative for abdominal pain, vomiting and diarrhea. Musculoskeletal: Positive for bilateral foot and ankle pain Skin: Negative for rash. Neurological: Negative for headaches, focal weakness or numbness.  All systems negative/normal/unremarkable except as stated in the HPI  ____________________________________________   PHYSICAL EXAM:  VITAL SIGNS: ED Triage Vitals  Enc Vitals Group     BP 04/05/17 0938 137/71     Pulse Rate 04/05/17 0938 64     Resp 04/05/17 0938 18     Temp 04/05/17 0938 98 F (36.7 C)     Temp Source 04/05/17 0938 Oral     SpO2 04/05/17 0938 98 %     Weight 04/05/17 0939 199 lb (90.3 kg)     Height 04/05/17 0939 5\' 10"  (1.778 m)     Head Circumference --      Peak Flow --      Pain Score 04/05/17 0938 10     Pain Loc --      Pain Edu? --      Excl. in GC? --    Constitutional: Alert and oriented. Well appearing and in no distress. Eyes: Conjunctivae are normal. Normal extraocular movements. Cardiovascular: Normal rate, regular rhythm. No murmurs, rubs, or gallops.  Good dorsalis pedis pulses and posterior tibial pulses that are easily dopplered and palpable Respiratory: Normal respiratory effort without tachypnea nor retractions. Breath sounds are clear and equal bilaterally. No wheezes/rales/rhonchi. Gastrointestinal: Soft and nontender. Normal bowel sounds Musculoskeletal: Limited range of  motion of the extremities.  He is wearing braces on his wrists and ankles bilaterally.. Neurologic:  Normal speech and language. No gross focal neurologic deficits are appreciated.  Skin:  Skin is warm, dry and intact. No rash noted. Psychiatric: Depressed mood and affect ____________________________________________  ED COURSE:  As part of my medical decision making, I reviewed the following data within the electronic MEDICAL RECORD NUMBER History obtained from family if available, nursing notes, old chart and ekg, as well as notes from prior ED visits. Patient presented for extremity pain which is acute on chronic, we will assess with labs and imaging as indicated at this time.   Procedures ____________________________________________  DIFFERENTIAL DIAGNOSIS   Neuropathy, claudication, musculoskeletal pain, cellulitis, osteomyelitis  FINAL ASSESSMENT AND PLAN  Neuropathy   Plan: Patient had presented for pain in his hands and feet unrelieved by his home medications.  Clinically this appears to be neuropathy and there does not appear to be any vascular issue present.  His vital signs are stable and I will give a short supply of tramadol.  He is stable for outpatient follow-up.   Ulice Dash, MD   Note: This note was generated in part or whole with voice recognition software. Voice  recognition is usually quite accurate but there are transcription errors that can and very often do occur. I apologize for any typographical errors that were not detected and corrected.     Emily FilbertWilliams, Jonathan E, MD 04/05/17 1006    Emily FilbertWilliams, Jonathan E, MD 04/05/17 1021

## 2017-04-05 NOTE — ED Notes (Signed)
Pt ambulatory to wheel chair upon discharge. Verbalized understanding of discharge instructions, follow-up care and prescription. VSS. Skin warm and dry. A&O x4.  

## 2017-04-06 ENCOUNTER — Other Ambulatory Visit: Payer: Self-pay | Admitting: Licensed Clinical Social Worker

## 2017-04-06 NOTE — Patient Outreach (Signed)
Assessment:  CSW spoke via phone with client.  CSW verified client identity. CSW received verbal permission from client for CSW to speak with client about client needs. CSW informed client that CSW had spoken recently with Cyndee BrightlyMiel Mock, Research officer, political partypractice administrator at [facility] (practice of Dr. Sherie DonLada, client's primary care doctor).  Miel had told CSW that Dr. Sherie DonLada was in process of discharging client from that practice and that a letter of client discharge was mailed from practice to client last week. [name] said he did not get such a letter. CSW then contacted Cyndee BrightlyMiel Mock via phone message on 04/06/17, and informed Miel that client did not receive discharge letter from Dr. Sherie DonLada.  CSW informed Orlinda BlalockMiel that Dr. Thana AtesMorrisey [phone]) had been assigned as new doctor for client.  CSW informed Meil that CSW had given client the name of Dr. Thana AtesMorrisey and phone number of doctor at [phone].  Client said he would call office of Dr. Thana AtesMorrisey tomorrow and schedule client appointment with Dr. Thana AtesMorrisey.  Client wrote down name and number of Dr Thana AtesMorrisey on 04/06/17.  Miel had told CSW previously that Dr. Sherie DonLada was discharging client from practice because client had communicated threats to medical staff at that practice.  Again, client said he would call Dr. Clance BollMorrisey's office tomrrow to arrange client appointment with Dr. Thana AtesMorrisey.   Plan:  CSW to call client as scheduled to assess client needs.  Kelton PillarMichael S.Kamerin Grumbine MSW, LCSW Licensed Clinical Social Worker [facility] Care Management [phone]            Plan:

## 2017-04-08 ENCOUNTER — Other Ambulatory Visit: Payer: Self-pay | Admitting: *Deleted

## 2017-04-08 NOTE — Patient Outreach (Signed)
Second unsuccessful telephone encounter to [employer] Achee, 83 year old male, follow up on status, recent ED visit on 04/05/2017 for Neuropathy, see if pt scheduled follow up appointment with Dr. Thana AtesMorrisey as [facility] was [facility]  Due to making threats.   Unable to leave a voice message as no voice message set up.    Plan:  RN CM to follow up again next week telephonically.     [name]   Pierzchala RN CCM [facility] Care Management  [phone]

## 2017-04-12 ENCOUNTER — Other Ambulatory Visit: Payer: Self-pay

## 2017-04-12 ENCOUNTER — Emergency Department
Admission: EM | Admit: 2017-04-12 | Discharge: 2017-04-12 | Disposition: A | Discharge status: Home / Self Care | Payer: Medicare Other | Attending: Emergency Medicine | Admitting: Emergency Medicine

## 2017-04-12 ENCOUNTER — Encounter: Payer: Self-pay | Admitting: Emergency Medicine

## 2017-04-12 ENCOUNTER — Emergency Department: Payer: Medicare Other

## 2017-04-12 ENCOUNTER — Other Ambulatory Visit: Payer: Self-pay | Admitting: *Deleted

## 2017-04-12 DIAGNOSIS — E119 Type 2 diabetes mellitus without complications: Secondary | ICD-10-CM | POA: Diagnosis not present

## 2017-04-12 DIAGNOSIS — Y999 Unspecified external cause status: Secondary | ICD-10-CM | POA: Diagnosis not present

## 2017-04-12 DIAGNOSIS — Y9301 Activity, walking, marching and hiking: Secondary | ICD-10-CM | POA: Insufficient documentation

## 2017-04-12 DIAGNOSIS — J449 Chronic obstructive pulmonary disease, unspecified: Secondary | ICD-10-CM | POA: Diagnosis not present

## 2017-04-12 DIAGNOSIS — Z950 Presence of cardiac pacemaker: Secondary | ICD-10-CM | POA: Diagnosis not present

## 2017-04-12 DIAGNOSIS — I509 Heart failure, unspecified: Secondary | ICD-10-CM | POA: Insufficient documentation

## 2017-04-12 DIAGNOSIS — Z7901 Long term (current) use of anticoagulants: Secondary | ICD-10-CM | POA: Diagnosis not present

## 2017-04-12 DIAGNOSIS — Z79899 Other long term (current) drug therapy: Secondary | ICD-10-CM | POA: Diagnosis not present

## 2017-04-12 DIAGNOSIS — S0083XA Contusion of other part of head, initial encounter: Secondary | ICD-10-CM | POA: Diagnosis not present

## 2017-04-12 DIAGNOSIS — Y929 Unspecified place or not applicable: Secondary | ICD-10-CM | POA: Insufficient documentation

## 2017-04-12 DIAGNOSIS — I2510 Atherosclerotic heart disease of native coronary artery without angina pectoris: Secondary | ICD-10-CM | POA: Diagnosis not present

## 2017-04-12 DIAGNOSIS — W19XXXA Unspecified fall, initial encounter: Secondary | ICD-10-CM

## 2017-04-12 DIAGNOSIS — S0990XA Unspecified injury of head, initial encounter: Secondary | ICD-10-CM | POA: Diagnosis present

## 2017-04-12 DIAGNOSIS — T07XXXA Unspecified multiple injuries, initial encounter: Secondary | ICD-10-CM

## 2017-04-12 DIAGNOSIS — Z7982 Long term (current) use of aspirin: Secondary | ICD-10-CM | POA: Diagnosis not present

## 2017-04-12 DIAGNOSIS — I110 Hypertensive heart disease with heart failure: Secondary | ICD-10-CM | POA: Insufficient documentation

## 2017-04-12 DIAGNOSIS — Z87891 Personal history of nicotine dependence: Secondary | ICD-10-CM | POA: Insufficient documentation

## 2017-04-12 DIAGNOSIS — W010XXA Fall on same level from slipping, tripping and stumbling without subsequent striking against object, initial encounter: Secondary | ICD-10-CM | POA: Insufficient documentation

## 2017-04-12 DIAGNOSIS — Z951 Presence of aortocoronary bypass graft: Secondary | ICD-10-CM | POA: Insufficient documentation

## 2017-04-12 NOTE — Discharge Instructions (Signed)
Follow-up with your regular doctor if not better in 5-7 days.  Return to emergency department if you are worsening.  Wash the areas that are open with soap and water daily.  Apply ice to any areas that hurt

## 2017-04-12 NOTE — ED Provider Notes (Signed)
[facility] Emergency Department Provider Note  ____________________________________________   First MD Initiated Contact with Patient 04/12/17 2045     (approximate)  I have reviewed the triage vital signs and the nursing notes.   HISTORY  Chief Complaint Fall    HPI Delane Gingerrthur E Palmateer is a 83 y.o. male who presents to the emergency room via EMS.  He states he was walking home from church and tripped and fell.  He states he has abrasions on his hands.  States he hit his head.  He has felt dizzy since hitting his head.  He is also complaining of left shoulder pain.  He denies any nausea or vomiting at this time.  Past Medical History:  Diagnosis Date  . Arthritis   . CAD (coronary artery disease)   . Congestive heart failure (CHF) (HCC) 06/14/2016   LVEF 40-45% 2016 echo; Dr. Darrold JunkerParaschos  . COPD (chronic obstructive pulmonary disease) (HCC)   . Diabetes mellitus without complication (HCC)   . Family history of adverse reaction to anesthesia    mom seizures   . GERD (gastroesophageal reflux disease)   . History of blood clots in legs   . History of ischemic cardiomyopathy   . 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 Hyperlipidemia   . Hypertension   . Literacy level of illiterate   . Osteomyelitis (HCC)   . PN (peripheral neuropathy)     Patient Active Problem List   Diagnosis Date Noted  . Abnormality of plasma protein 03/25/2017  . Elevated beta-2 microglobulin 03/25/2017  . Right leg DVT (HCC) 03/15/2017  . GERD (gastroesophageal reflux disease) 03/10/2017  . Hyperlipidemia, unspecified 03/10/2017  . Peripheral arterial occlusive disease (HCC) 11/24/2016  . Bilateral lower extremity edema 10/19/2016  . Diabetic foot ulcers (HCC) 07/08/2016  . Congestive heart failure (CHF) (HCC) 06/14/2016  . Medication monitoring encounter 06/14/2016  . Hyperthyroidism 12/04/2015  . Unsteady gait 12/04/2015  . Major neurocognitive disorder as late effect of  traumatic brain injury with behavioral disturbance (HCC) 08/11/2015  . 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 Abnormal serum protein electrophoresis 12/05/2014  . Pressure ulcer 11/15/2014  . Hyperproteinemia 11/02/2014  . Hypoalbuminemia 11/02/2014  . Arthritis 10/23/2014  . Arteriosclerosis of coronary artery 10/23/2014  . Diabetic foot ulcer associated with type 2 diabetes mellitus (HCC) 10/23/2014  . Amputation of finger of left hand 10/23/2014  . History of pulmonary embolism 10/23/2014  . Literacy problem 10/23/2014  . Umbilical hernia without obstruction or gangrene 10/23/2014  . Neuropathy of lower extremity 10/23/2014  . History of osteomyelitis 10/23/2014  . Artificial cardiac pacemaker 08/10/2013  . H/O coronary artery bypass surgery 08/10/2013  . GIB (gastrointestinal bleeding) 07/18/2013  . Benign prostatic hyperplasia with urinary obstruction 09/27/2012  . Abnormal prostate specific antigen 09/27/2012  . Benign localized hyperplasia of prostate with urinary obstruction and other lower urinary tract symptoms (LUTS)(600.21) 09/27/2012  . Pulmonary embolism (HCC) 08/15/2012  . Cardiomyopathy (HCC) 08/09/2011    Past Surgical History:  Procedure Laterality Date  . amputation 4th finger Left   . APPENDECTOMY    . CLAVICLE SURGERY  2012   open reduction and internal fixation of left clavicle  . CORONARY ARTERY BYPASS GRAFT  2002  . FOOT SURGERY    . IMPLANTABLE CARDIOVERTER DEFIBRILLATOR (ICD) GENERATOR CHANGE N/A 12/03/2015   Procedure: ICD GENERATOR CHANGE;  Surgeon: Marcina MillardAlexander Paraschos, MD;  Location: [facility] ORS;  Service: Cardiovascular;  Laterality: N/A;  . PACEMAKER INSERTION      Prior to Admission medications  Medication Sig Start Date End Date Taking? Authorizing Provider  acetaminophen (TYLENOL) 500 MG tablet Take 500 mg by mouth every 8 (eight) hours as needed.    [provider]  Acidophilus Lactobacillus CAPS Take 1 capsule by mouth 2 (two) times  daily.    [provider]  apixaban (ELIQUIS) 5 MG TABS tablet Take 10 mg twice a day for 7 days and then 5mg  twice a day until your doctor tells you to stop. 02/27/17   Nita Sickle, MD  aspirin EC 81 MG tablet Take 1 tablet (81 mg total) by mouth daily. 05/11/16   Kerman Passey, MD  atorvastatin (LIPITOR) 40 MG tablet Take 1 tablet (40 mg total) by mouth at bedtime. 12/17/16   Kerman Passey, MD  Elastic Bandages & Supports (TRUFORM STOCKINGS 20-30MMHG) MISC Wear on both legs; put on first thing in the morning and remove before bed; do NOT sleep in them 03/24/17   [name], Janit Bern, MD  Elastic Bandages & Supports (WRIST BRACE/RIGHT LARGE) MISC Wear on the right wrist, cock-up wrist splint for carpal tunnel 03/24/17   Kerman Passey, MD  furosemide (LASIX) 20 MG tablet Take 1 tablet (20 mg total) by mouth daily. 03/28/17   Kerman Passey, MD  gabapentin (NEURONTIN) 300 MG capsule One by mouth daily x 3 days, then once twice a day x 3 days, then one three times a day 03/24/17   Kerman Passey, MD  ibuprofen (ADVIL,MOTRIN) 200 MG tablet Take 200 mg by mouth every 6 (six) hours as needed. Pt takes twice a day, am/pm    [provider]  Omega-3 Fatty Acids (FISH OIL) 1000 MG CAPS Take 1,000 mg by mouth daily.    [provider]  omeprazole (PRILOSEC) 20 MG capsule Take 1 capsule (20 mg total) daily by mouth. 01/03/17   [name], Janit Bern, MD  potassium chloride (K-DUR) 10 MEQ tablet Take 1 tablet (10 mEq total) by mouth daily. 03/28/17   LadaJanit Bern, MD  Saw Palmetto, Serenoa repens, (SAW PALMETTO PO) Take 1 capsule by mouth daily.     [provider]  traMADol (ULTRAM) 50 MG tablet Take 1 tablet (50 mg total) by mouth every 6 (six) hours as needed. 04/05/17 04/05/18  Emily Filbert, MD  vitamin B-12 (CYANOCOBALAMIN) 500 MCG tablet Take 500 mcg by mouth daily.    [provider]    Allergies Oxycodone-acetaminophen and Percocet  [oxycodone-acetaminophen]  Family History  Problem Relation Age of Onset  . Cancer Mother        spine  . Hypertension Mother   . Mental illness Mother   . Cancer Father        lung  . Asthma Father   . Allergic rhinitis Father   . Arthritis Father   . Tuberculosis Father   . Gout Brother   . Allergic rhinitis Brother     Social History Social History   Tobacco Use  . Smoking status: Former Smoker    Packs/day: 0.25    Years: 10.00    Pack years: 2.50    Types: Cigarettes, Cigars    Last attempt to quit: 10/18/1979    Years since quitting: 37.5  . Smokeless tobacco: Never Used  Substance Use Topics  . Alcohol use: No  . Drug use: No    Review of Systems  Constitutional: No fever/chills Eyes: No visual changes. ENT: No sore throat. Respiratory: Denies cough Genitourinary: Negative for dysuria. Musculoskeletal: Negative for  back pain.  Positive left shoulder, left hand, left knee pain Skin: Negative for rash.    ____________________________________________   PHYSICAL EXAM:  VITAL SIGNS: ED Triage Vitals  Enc Vitals Group     BP 04/12/17 2009 130/75     Pulse Rate 04/12/17 2009 79     Resp 04/12/17 2009 18     Temp 04/12/17 2009 98 F (36.7 C)     Temp Source 04/12/17 2009 Oral     SpO2 04/12/17 2009 97 %     Weight 04/12/17 2007 200 lb (90.7 kg)     Height 04/12/17 2007 5\' 10"  (1.778 m)     Head Circumference --      Peak Flow --      Pain Score 04/12/17 2007 8     Pain Loc --      Pain Edu? --      Excl. in GC? --     Constitutional: Alert and oriented. Well appearing and in no acute distress.  Positive for an abrasion at the left brow Eyes: Conjunctivae are normal.  p err l Head: Atraumatic.  Positive abrasion over the left brow Nose: No congestion/rhinnorhea. Mouth/Throat: Mucous membranes are moist.   Cardiovascular: Normal rate, regular rhythm.  Heart sounds are normal Respiratory: Normal respiratory effort.  No retractions, lungs clear  to auscultation Abdomen: Soft nontender GU: deferred Musculoskeletal: FROM all extremities, warm and well perfused.  The left hand is tender along all the fingers.  The left knee has a large abrasion and is tender to palpation.  The left shoulder is tender along the clavicle.  Neurovascular is intact Neurologic:  Normal speech and language.  Is acting age appropriately.  Was able to carry on a conversation with family members on the phone without any difficulty Skin:  Skin is warm, dry. No rash noted.  Positive for abrasions Psychiatric: Mood and affect are normal. Speech and behavior are normal.  ____________________________________________   LABS (all labs ordered are listed, but only abnormal results are displayed)  Labs Reviewed - No data to display ____________________________________________   ____________________________________________  RADIOLOGY  CT the head without contrast is negative for an acute abnormality X-ray of the left hand is negative for acute abnormality X-ray of the left knee is negative for acute abnormality X-ray of the left Clavicle is negative for acute abnormality  ____________________________________________   PROCEDURES  Procedure(s) performed: No  Procedures    ____________________________________________   INITIAL IMPRESSION / ASSESSMENT AND PLAN / ED COURSE  Pertinent labs & imaging results that were available during my care of the patient were reviewed by me and considered in my medical decision making (see chart for details).  Male presents emergency department after tripping and falling while walking home from church.  He is complaining of multiple injuries.  X-rays and CT of the head were negative for any acute abnormalities.  The nurse was to clean and put Band-Aids on his abrasions.  The results were discussed with the patient.  He states he understands.  He will take Tylenol as needed for pain.  He was discharged in stable  condition.  He is to follow-up with his regular doctor if he has any further issues.     As part of my medical decision making, I reviewed the following data within the electronic MEDICAL RECORD NUMBER Nursing notes reviewed and incorporated, Old chart reviewed, Radiograph reviewed see the above all are negative, Notes from prior ED visits and [place] Controlled Substance Database  ____________________________________________   FINAL CLINICAL IMPRESSION(S) / ED DIAGNOSES  Final diagnoses:  Fall, initial encounter  Contusion of face, initial encounter  Abrasions of multiple sites      NEW MEDICATIONS STARTED DURING THIS VISIT:  Discharge Medication List as of 04/12/2017 10:42 PM       Note:  This document was prepared using Dragon voice recognition software and may include unintentional dictation errors.    Faythe Ghee, PA-C 04/12/17 2339    Jeanmarie Plant, MD 04/18/17 [phone]

## 2017-04-12 NOTE — Patient Outreach (Signed)
Successful telephone encounter to [employer] Tan[employer], [id number] year old male- follow up on current clinical status, recent ED visit on 04/05/17 for Neuropathy, if scheduled  Follow up appointment with Dr. Thana AtesMorrisey (new MD assigned).  Pt reports did pick up new medication for his neuropathy, taking it  three times a day, taking all of his  Medications.   Pt reports he thinks he called Dr. Thana AtesMorrisey, not sure  to which RN CM provided MD's office number for pt to call.  Pt reports he is going to call MD when he hangs up with RN CM.  Pt reports he did not receive letter from Dr. Marlise EvesLada's office.    Plan:  As discussed with pt, plan to follow up tomorrow telephonically  to see if  Scheduled appointment to follow up with Dr. Thana AtesMorrisey.    [name]   Pierzchala RN CCM [facility]  [phone]

## 2017-04-12 NOTE — ED Triage Notes (Signed)
Pt to triage via w/c with no distress noted, brought in by EMS; pt reports tripped walking home and fell, catching self with hands; abrasion to left knee, left index finger and right 4th finger, abrasion over left eye; c/o pain lower back, palms, and left knee and shoulders

## 2017-04-18 ENCOUNTER — Other Ambulatory Visit: Payer: Self-pay | Admitting: *Deleted

## 2017-04-18 NOTE — Patient Outreach (Signed)
04/18/2017   Successful telephone encounter to [name], 83 year old male follow up on recent ED visit 04/12/17 for fall, see if pt scheduled follow up visit with MD (view in EMR told [facility]  [facility] (04/15/17).  Pt's previous ED visit 04/05/17 for neuropathy.  Pt currently has no PCP as was [facility]Dr[facility] Sherie DonLada due to verbal threats toward staff.   Spoke with pt, HIPAA identifiers verified.  Pt reports suppose to see MD today at 1 pm, not sure if going to go due to no transportation- everyone busy to which RN CM encouraged pt to reschedule  As pt currently has no PCP.  Pt reports if he feels like it will call, hung up phone.    Follow up call:  RN CM called pt back right back to discuss recent ED visit to which pt reports currently no dizziness, pain all the time with today in feet  (burning).  RN [facility] with [facility], will inform [name] LCSW of discharge to which pt voiced  Understanding.    Follow up call to pt/complete care plan:  Spoke with pt, HIPAA identifiers  Verified, discussed any recent issues with DVT to which pt reports none, taking all of the medications he has, has only one Potassium pill left, if don't  Get the medication, told RN CM not to worry about it.   RN CM discussed  With pt again [facility] (needs to establish new  PCP),let them know need refill on Potassium.  Pt reports if don't get  Medication, don't worry about it, have a good day, phone call ended.   Plan:  As discussed with pt, plan to [facility] (pt not progressing), need to establish new PCP.            RN CM to inform [facility] of discharge.    [name] M.   Pierzchala RN CCM [name] Care [facility]  [phone]

## 2017-04-25 ENCOUNTER — Emergency Department
Admission: EM | Admit: 2017-04-25 | Discharge: 2017-04-25 | Disposition: A | Discharge status: Home / Self Care | Payer: Medicare Other | Attending: Emergency Medicine | Admitting: Emergency Medicine

## 2017-04-25 ENCOUNTER — Emergency Department: Payer: Medicare Other

## 2017-04-25 ENCOUNTER — Other Ambulatory Visit: Payer: Self-pay

## 2017-04-25 DIAGNOSIS — I110 Hypertensive heart disease with heart failure: Secondary | ICD-10-CM | POA: Diagnosis not present

## 2017-04-25 DIAGNOSIS — I509 Heart failure, unspecified: Secondary | ICD-10-CM | POA: Diagnosis not present

## 2017-04-25 DIAGNOSIS — Z7901 Long term (current) use of anticoagulants: Secondary | ICD-10-CM | POA: Insufficient documentation

## 2017-04-25 DIAGNOSIS — E119 Type 2 diabetes mellitus without complications: Secondary | ICD-10-CM | POA: Diagnosis not present

## 2017-04-25 DIAGNOSIS — J449 Chronic obstructive pulmonary disease, unspecified: Secondary | ICD-10-CM | POA: Insufficient documentation

## 2017-04-25 DIAGNOSIS — I2510 Atherosclerotic heart disease of native coronary artery without angina pectoris: Secondary | ICD-10-CM | POA: Insufficient documentation

## 2017-04-25 DIAGNOSIS — M79642 Pain in left hand: Secondary | ICD-10-CM | POA: Insufficient documentation

## 2017-04-25 DIAGNOSIS — Z79899 Other long term (current) drug therapy: Secondary | ICD-10-CM | POA: Insufficient documentation

## 2017-04-25 DIAGNOSIS — Z7982 Long term (current) use of aspirin: Secondary | ICD-10-CM | POA: Insufficient documentation

## 2017-04-25 DIAGNOSIS — M79641 Pain in right hand: Secondary | ICD-10-CM | POA: Diagnosis present

## 2017-04-25 DIAGNOSIS — Z951 Presence of aortocoronary bypass graft: Secondary | ICD-10-CM | POA: Insufficient documentation

## 2017-04-25 DIAGNOSIS — E0590 Thyrotoxicosis, unspecified without thyrotoxic crisis or storm: Secondary | ICD-10-CM | POA: Insufficient documentation

## 2017-04-25 DIAGNOSIS — Z95810 Presence of automatic (implantable) cardiac defibrillator: Secondary | ICD-10-CM | POA: Insufficient documentation

## 2017-04-25 DIAGNOSIS — Z87891 Personal history of nicotine dependence: Secondary | ICD-10-CM | POA: Insufficient documentation

## 2017-04-25 DIAGNOSIS — M79672 Pain in left foot: Secondary | ICD-10-CM | POA: Diagnosis not present

## 2017-04-25 NOTE — ED Triage Notes (Signed)
Pt comes into the ED via EMS from home with c/o hand and feet pain, states he has neuropathy and takes Neurontin for the pain , states sometimes it helps and sometimes it doesn't. Pt is in NAD at present.

## 2017-04-25 NOTE — ED Notes (Signed)
See triage note  Presents via ems from home with pain to both hands and both feet  Currently is on gabapentin and has occasional relief with same  States he fell about 2 week ago  And states he is not able to use his hands and has had near falls since

## 2017-04-25 NOTE — Discharge Instructions (Signed)
You need to call care Medicare provider and asked them to evaluate you for help.  They will evaluate whether you need an aide at the house.  There is no reason to admit you to the hospital today.  Follow-up with the primary care doctor.  It is very important that you get a primary care doctor

## 2017-04-25 NOTE — ED Provider Notes (Signed)
[facility] Emergency Department Provider Note  ____________________________________________   First MD Initiated Contact with Patient 04/25/17 1103     (approximate)  I have reviewed the triage vital signs and the nursing notes.   HISTORY  Chief Complaint Foot Pain    HPI [name] is a 83 y.o. male presents to the emergency department via EMS with complaints of pain to both hands and feet.  He fell back in February and has been taking gabapentin which does not really help his pain.  He states he cannot do anything.  He states he needs to come into the hospital because he almost fell at home again.  He states he cannot cook for himself and he cannot go to the bathroom without his son or neighbor helping him.  He denies any new injuries.  He denies any new pain.  He denies any chest pain or shortness of breath.  Denies cough or congestion.  Denies vomiting or diarrhea.  Denies headache.  He just wants to be admitted to the hospital  Past Medical History:  Diagnosis Date  . Arthritis   . CAD (coronary artery disease)   . Congestive heart failure (CHF) (HCC) 06/14/2016   LVEF 40-45% 2016 echo; Dr. Darrold Junker  . COPD (chronic obstructive pulmonary disease) (HCC)   . Diabetes mellitus without complication (HCC)   . Family history of adverse reaction to anesthesia    mom seizures   . GERD (gastroesophageal reflux disease)   . History of blood clots in legs   . History of ischemic cardiomyopathy   . 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 Hyperlipidemia   . Hypertension   . Literacy level of illiterate   . Osteomyelitis (HCC)   . PN (peripheral neuropathy)     Patient Active Problem List   Diagnosis Date Noted  . Abnormality of plasma protein 03/25/2017  . Elevated beta-2 microglobulin 03/25/2017  . Right leg DVT (HCC) 03/15/2017  . GERD (gastroesophageal reflux disease) 03/10/2017  . Hyperlipidemia, unspecified 03/10/2017  . Peripheral arterial  occlusive disease (HCC) 11/24/2016  . Bilateral lower extremity edema 10/19/2016  . Diabetic foot ulcers (HCC) 07/08/2016  . Congestive heart failure (CHF) (HCC) 06/14/2016  . Medication monitoring encounter 06/14/2016  . Hyperthyroidism 12/04/2015  . Unsteady gait 12/04/2015  . 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 Abnormal serum protein electrophoresis 12/05/2014  . Pressure ulcer 11/15/2014  . Hyperproteinemia 11/02/2014  . Hypoalbuminemia 11/02/2014  . Arthritis 10/23/2014  . Arteriosclerosis of coronary artery 10/23/2014  . Diabetic foot ulcer associated with type 2 diabetes mellitus (HCC) 10/23/2014  . Amputation of finger of left hand 10/23/2014  . History of pulmonary embolism 10/23/2014  . Literacy problem 10/23/2014  . Umbilical hernia without obstruction or gangrene 10/23/2014  . Neuropathy of lower extremity 10/23/2014  . History of osteomyelitis 10/23/2014  . Artificial cardiac pacemaker 08/10/2013  . H/O coronary artery bypass surgery 08/10/2013  . GIB (gastrointestinal bleeding) 07/18/2013  . Benign prostatic hyperplasia with urinary obstruction 09/27/2012  . Abnormal prostate specific antigen 09/27/2012  . Benign localized hyperplasia of prostate with urinary obstruction and other lower urinary tract symptoms (LUTS)(600.21) 09/27/2012  . Pulmonary embolism (HCC) 08/15/2012  . Cardiomyopathy (HCC) 08/09/2011    Past Surgical History:  Procedure Laterality Date  . amputation 4th finger Left   . APPENDECTOMY    . CLAVICLE SURGERY  2012   open reduction and internal fixation  of left clavicle  . CORONARY ARTERY BYPASS GRAFT  2002  . FOOT SURGERY    . IMPLANTABLE CARDIOVERTER DEFIBRILLATOR (ICD) GENERATOR CHANGE N/A 12/03/2015   Procedure: ICD GENERATOR CHANGE;  Surgeon: Marcina Millard, MD;  Location: [facility] ORS;  Service: Cardiovascular;   Laterality: N/A;  . PACEMAKER INSERTION      Prior to Admission medications   Medication Sig Start Date End Date Taking? Authorizing Provider  acetaminophen (TYLENOL) 500 MG tablet Take 500 mg by mouth every 8 (eight) hours as needed.    [provider]  Acidophilus Lactobacillus CAPS Take 1 capsule by mouth 2 (two) times daily.    [provider]  apixaban (ELIQUIS) 5 MG TABS tablet Take 10 mg twice a day for 7 days and then 5mg  twice a day until your doctor tells you to stop. 02/27/17   Nita Sickle, MD  aspirin EC 81 MG tablet Take 1 tablet (81 mg total) by mouth daily. 05/11/16   Kerman Passey, MD  atorvastatin (LIPITOR) 40 MG tablet Take 1 tablet (40 mg total) by mouth at bedtime. 12/17/16   Kerman Passey, MD  Elastic Bandages & Supports (TRUFORM STOCKINGS 20-30MMHG) MISC Wear on both legs; put on first thing in the morning and remove before bed; do NOT sleep in them 03/24/17   [name], Janit Bern, MD  Elastic Bandages & Supports (WRIST BRACE/RIGHT LARGE) MISC Wear on the right wrist, cock-up wrist splint for carpal tunnel 03/24/17   Kerman Passey, MD  furosemide (LASIX) 20 MG tablet Take 1 tablet (20 mg total) by mouth daily. 03/28/17   Kerman Passey, MD  gabapentin (NEURONTIN) 300 MG capsule One by mouth daily x 3 days, then once twice a day x 3 days, then one three times a day 03/24/17   Kerman Passey, MD  ibuprofen (ADVIL,MOTRIN) 200 MG tablet Take 200 mg by mouth every 6 (six) hours as needed. Pt takes twice a day, am/pm    [provider]  Omega-3 Fatty Acids (FISH OIL) 1000 MG CAPS Take 1,000 mg by mouth daily.    [provider]  omeprazole (PRILOSEC) 20 MG capsule Take 1 capsule (20 mg total) daily by mouth. 01/03/17   [name], Janit Bern, MD  potassium chloride (K-DUR) 10 MEQ tablet Take 1 tablet (10 mEq total) by mouth daily. 03/28/17   LadaJanit Bern, MD  Saw Palmetto, Serenoa repens, (SAW PALMETTO PO) Take 1 capsule by mouth daily.     [provider]  traMADol (ULTRAM) 50 MG tablet Take 1 tablet (50 mg total) by mouth every 6 (six) hours as needed. 04/05/17 04/05/18  Emily Filbert, MD  vitamin B-12 (CYANOCOBALAMIN) 500 MCG tablet Take 500 mcg by mouth daily.    [provider]    Allergies Oxycodone-acetaminophen and Percocet [oxycodone-acetaminophen]  Family History  Problem Relation Age of Onset  . Cancer Mother        spine  . Hypertension Mother   . Mental illness Mother   . Cancer Father        lung  . Asthma Father   . Allergic rhinitis Father   . Arthritis Father   . Tuberculosis Father   . Gout Brother   . Allergic rhinitis Brother     Social History Social History   Tobacco Use  . Smoking status: Former Smoker    Packs/day: 0.25    Years: 10.00    Pack years: 2.50    Types: Cigarettes, Cigars  Last attempt to quit: 10/18/1979    Years since quitting: 37.5  . Smokeless tobacco: Never Used  Substance Use Topics  . Alcohol use: No  . Drug use: No    Review of Systems  Constitutional: No fever/chills Eyes: No visual changes. ENT: No sore throat. Respiratory: Denies cough Genitourinary: Negative for dysuria. Musculoskeletal: Negative for back pain.  Positive for hand and foot pain Skin: Negative for rash.    ____________________________________________   PHYSICAL EXAM:  VITAL SIGNS: ED Triage Vitals  Enc Vitals Group     BP 04/25/17 1028 133/63     Pulse Rate 04/25/17 1028 61     Resp 04/25/17 1028 20     Temp 04/25/17 1028 98 F (36.7 C)     Temp src --      SpO2 04/25/17 1028 99 %     Weight 04/25/17 1024 200 lb (90.7 kg)     Height 04/25/17 1024 5\' 10"  (1.778 m)     Head Circumference --      Peak Flow --      Pain Score 04/25/17 1023 8     Pain Loc --      Pain Edu? --      Excl. in GC? --     Constitutional: Alert and oriented. Well appearing and in no acute distress. Eyes: Conjunctivae are normal.  Head: Atraumatic. Nose: No  congestion/rhinnorhea. Mouth/Throat: Mucous membranes are moist.   Cardiovascular: Normal rate, regular rhythm.  Heart sounds are normal Respiratory: Normal respiratory effort.  No retractions, lungs clear to auscultation GU: deferred Musculoskeletal: FROM all extremities, warm and well perfused, both feet are in orthopedic walking shoes.  These are the same he had on the last visit.  Both hands appear normal.  There are no open wounds.  They seem to be well-healed from the previous fall.  His neck and spine are not tender. Neurologic:  Normal speech and language.  Patient is active and talkative.  There is no slurred speech. Skin:  Skin is warm, dry and intact. No rash noted. Psychiatric: Mood and affect are normal. Speech and behavior are normal.  ____________________________________________   LABS (all labs ordered are listed, but only abnormal results are displayed)  Labs Reviewed - No data to display ____________________________________________   ____________________________________________  RADIOLOGY  CT of the C-spine is negative  ____________________________________________   PROCEDURES  Procedure(s) performed: No  Procedures    ____________________________________________   INITIAL IMPRESSION / ASSESSMENT AND PLAN / ED COURSE  Pertinent labs & imaging results that were available during my care of the patient were reviewed by me and considered in my medical decision making (see chart for details).  Patient is a 83 year old male presents emergency department via EMS complaining of pain to both hands and feet.  He states that he cannot do anything for himself at home.  He states he needs to be admitted to the hospital.  He does not understand why we cannot just put him in since he fell 2 weeks ago.  While trying to explain to the patient that he does not have any criteria for admission.  He is yelling and states he needs to see a doctor.  Told him I would go and  get a doctor to come back and talk to him.  But that the doctor would probably have the same answer I do.  He have to have certain criteria to be admitted to the hospital.  And that was left for Dr. Scotty CourtStafford  or Dr. Cyril Loosen to come back to flex and discussed this with patient.    ----------------------------------------- 12:38 PM on 04/25/2017 -----------------------------------------  Dr. Scotty Court recommended MRI of the C-spine.  We are unable to do an MRI due to the patient having a pacemaker.  A CT of the C-spine was ordered  CT of the C-spine shows no acute abnormality.  CT results were discussed with the patient.  He is still argumentative and wants to stay in the hospital.  Explained to him that he should call Medicare and see if they will will provide an aide to come to his house and help him.  He is to call the phone number on his Medicare card.  He states he understands and will comply.  He was discharged in stable condition after he had been given a [place] sandwich.  As part of my medical decision making, I reviewed the following data within the electronic MEDICAL RECORD NUMBER Nursing notes reviewed and incorporated, Radiograph reviewed CT of the C-spine is negative for acute abnormality, Notes from prior ED visits and [place] Controlled Substance Database  ____________________________________________   FINAL CLINICAL IMPRESSION(S) / ED DIAGNOSES  Final diagnoses:  Foot pain, left  Pain in both hands      NEW MEDICATIONS STARTED DURING THIS VISIT:  Discharge Medication List as of 04/25/2017  1:54 PM       Note:  This document was prepared using Dragon voice recognition software and may include unintentional dictation errors.    Faythe Ghee, PA-C 04/25/17 1551    Jene Every, MD 04/26/17 [id number]

## 2017-04-26 ENCOUNTER — Telehealth: Payer: Self-pay | Admitting: General Practice

## 2017-04-26 NOTE — Telephone Encounter (Signed)
Certified Letter (dismissal letter) was returned to our office on today.  The post office tried on 04/07/17, 04/13/17, and 04/22/17 to deliver letter to patient but they were unsuccessful.  Letter has now gone out regular postal mail for delivery.

## 2017-04-28 ENCOUNTER — Ambulatory Visit (INDEPENDENT_AMBULATORY_CARE_PROVIDER_SITE_OTHER): Payer: Medicare Other | Admitting: Family Medicine

## 2017-04-28 ENCOUNTER — Telehealth: Payer: Self-pay | Admitting: Family Medicine

## 2017-04-28 ENCOUNTER — Encounter: Payer: Self-pay | Admitting: Family Medicine

## 2017-04-28 ENCOUNTER — Other Ambulatory Visit: Payer: Self-pay

## 2017-04-28 VITALS — BP 114/65 | HR 71 | Temp 97.7°F | Resp 16 | Ht <= 58 in | Wt 202.0 lb

## 2017-04-28 DIAGNOSIS — E11621 Type 2 diabetes mellitus with foot ulcer: Secondary | ICD-10-CM

## 2017-04-28 DIAGNOSIS — E0590 Thyrotoxicosis, unspecified without thyrotoxic crisis or storm: Secondary | ICD-10-CM | POA: Diagnosis not present

## 2017-04-28 DIAGNOSIS — G609 Hereditary and idiopathic neuropathy, unspecified: Secondary | ICD-10-CM

## 2017-04-28 DIAGNOSIS — I5022 Chronic systolic (congestive) heart failure: Secondary | ICD-10-CM | POA: Diagnosis not present

## 2017-04-28 DIAGNOSIS — I779 Disorder of arteries and arterioles, unspecified: Secondary | ICD-10-CM

## 2017-04-28 DIAGNOSIS — K219 Gastro-esophageal reflux disease without esophagitis: Secondary | ICD-10-CM

## 2017-04-28 DIAGNOSIS — Z7689 Persons encountering health services in other specified circumstances: Secondary | ICD-10-CM

## 2017-04-28 DIAGNOSIS — N138 Other obstructive and reflux uropathy: Secondary | ICD-10-CM

## 2017-04-28 DIAGNOSIS — L97421 Non-pressure chronic ulcer of left heel and midfoot limited to breakdown of skin: Secondary | ICD-10-CM | POA: Diagnosis not present

## 2017-04-28 DIAGNOSIS — M153 Secondary multiple arthritis: Secondary | ICD-10-CM

## 2017-04-28 DIAGNOSIS — N401 Enlarged prostate with lower urinary tract symptoms: Secondary | ICD-10-CM

## 2017-04-28 MED ORDER — PANTOPRAZOLE SODIUM 40 MG PO TBEC: 40.0000 mg | DELAYED_RELEASE_TABLET | Freq: Every day | ORAL | 2 refills | Status: DC

## 2017-04-28 MED ORDER — POTASSIUM CHLORIDE ER 10 MEQ PO TBCR: 10.0000 meq | EXTENDED_RELEASE_TABLET | Freq: Every day | ORAL | 2 refills | Status: DC

## 2017-04-28 MED ORDER — GABAPENTIN 300 MG PO CAPS
300.0000 mg | ORAL_CAPSULE | Freq: Three times a day (TID) | ORAL | 2 refills | Status: DC
Start: 2017-04-28 — End: 2017-06-02

## 2017-04-28 MED ORDER — FUROSEMIDE 20 MG PO TABS: 20.0000 mg | ORAL_TABLET | Freq: Every day | ORAL | 2 refills | Status: DC

## 2017-04-28 NOTE — Assessment & Plan Note (Signed)
Likely contributing to chronic lower extremity pain / neuropathy Followed by Vascular

## 2017-04-28 NOTE — Assessment & Plan Note (Signed)
Likely affecting neuropathy Will return in 3 months for DM A1c follow-up Not focus of visit today

## 2017-04-28 NOTE — Assessment & Plan Note (Signed)
Uncontrolled, chronic problem Refractory to Zantac 150 BID Likely secondary to NSAID use among other factors DC H2 blocker Start rx Protonix 40mg  daily - previously on Omeprazole 20 in past limited relief

## 2017-04-28 NOTE — Assessment & Plan Note (Signed)
Secondary etiology for chronic pain May continue on NSAIDs for now and Gabapentin Will need to review rest of chart and determine further management in future, may need referral to Pain Management

## 2017-04-28 NOTE — Telephone Encounter (Signed)
Patient's Kenneth MoroGrandson, [name], notified our front office after visit that his grandfather, [name], may benefit from home health or additional help at home.  This question was not specifically raised to me during the visit today, however I do understand he has needs at home. I have attempted to contact his grandson back to review the specific needs and what type of order would need to be placed, I did not reach him, left voicemail.  If he calls back, I would need to know:  1. Would he need Home Health services? - such as Physical Therapy, Skilled Nurse for vital signs and medication assistance? - this would only be 1-2 x week and temporary for a few weeks.  2. Or are they asking for additional help at home? Daily activities and assistance? - This is not skilled nursing, and may be offered by a [facility], otherwise usually paid out of pocket or by third party, and they may contact their Insurance next to see what they qualify for.  You may direct him to website - [email or web address] to look into it more or address below:  [facility] Address: [name], [place], [place] [address] Phone: [phone]  They will have list of home health companies and home health aides if they need this.  Let me know what they decide or need at this time.  Kenneth PilarAlexander Karamalegos, DO [facility] 04/28/2017, 1:28 PM
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
## 2017-04-28 NOTE — Assessment & Plan Note (Signed)
Continue on [employer]

## 2017-04-28 NOTE — Assessment & Plan Note (Signed)
Chronic problem, presumed multifactorial secondary to prior injuries, edema, diabetes - Somewhat improved on Gabapentin, has been taking smaller dose - Rx refill Gabapentin 300mg  TID, inc to TID instead of BID, adjust further - Review chart will likely need referral to Neurology / Podiatry in future as well, consider adjunct therapy for neuropathic pain

## 2017-04-28 NOTE — Assessment & Plan Note (Signed)
Stable LE edema, otherwise exam appears stable, lungs clear Refill Lasix 20mg  daily, K supplement daily Follow-up with Cardiology as planned

## 2017-04-28 NOTE — Patient Instructions (Addendum)
Thank you for coming to the office today.  1.  [facility] [address]  Discontinue Tramadol, this was short term only from hospital, I do not plan to continue this med  Increase Gabapentin for nerve pain up to 300mg  3 times a day - we may adjust the dose and increase in future   Please schedule a Follow-up Appointment to: Return in about 3 months (around 07/29/2017) for Diabetes A1c, Neuropathy.   If you have any other questions or concerns, please feel free to call the office or send a message through MyChart. You may also schedule an earlier appointment if necessary.  Additionally, you may be receiving a survey about your experience at our office within a few days to 1 week by e-mail or mail. We value your feedback.  Saralyn PilarAlexander Karamalegos, DO [facility]

## 2017-04-29 ENCOUNTER — Other Ambulatory Visit: Payer: Self-pay

## 2017-04-29 ENCOUNTER — Encounter: Payer: Self-pay | Admitting: Emergency Medicine

## 2017-04-29 ENCOUNTER — Emergency Department: Payer: Medicare Other

## 2017-04-29 ENCOUNTER — Emergency Department
Admission: EM | Admit: 2017-04-29 | Discharge: 2017-04-30 | Disposition: A | Discharge status: Home / Self Care | Payer: Medicare Other | Attending: Emergency Medicine | Admitting: Emergency Medicine

## 2017-04-29 DIAGNOSIS — Z7901 Long term (current) use of anticoagulants: Secondary | ICD-10-CM | POA: Insufficient documentation

## 2017-04-29 DIAGNOSIS — Z95810 Presence of automatic (implantable) cardiac defibrillator: Secondary | ICD-10-CM | POA: Insufficient documentation

## 2017-04-29 DIAGNOSIS — Z79899 Other long term (current) drug therapy: Secondary | ICD-10-CM | POA: Diagnosis not present

## 2017-04-29 DIAGNOSIS — K219 Gastro-esophageal reflux disease without esophagitis: Secondary | ICD-10-CM | POA: Diagnosis not present

## 2017-04-29 DIAGNOSIS — I110 Hypertensive heart disease with heart failure: Secondary | ICD-10-CM | POA: Insufficient documentation

## 2017-04-29 DIAGNOSIS — J449 Chronic obstructive pulmonary disease, unspecified: Secondary | ICD-10-CM | POA: Insufficient documentation

## 2017-04-29 DIAGNOSIS — E1140 Type 2 diabetes mellitus with diabetic neuropathy, unspecified: Secondary | ICD-10-CM | POA: Insufficient documentation

## 2017-04-29 DIAGNOSIS — F0390 Unspecified dementia without behavioral disturbance: Secondary | ICD-10-CM | POA: Insufficient documentation

## 2017-04-29 DIAGNOSIS — Z951 Presence of aortocoronary bypass graft: Secondary | ICD-10-CM | POA: Insufficient documentation

## 2017-04-29 DIAGNOSIS — R079 Chest pain, unspecified: Secondary | ICD-10-CM

## 2017-04-29 DIAGNOSIS — Z87891 Personal history of nicotine dependence: Secondary | ICD-10-CM | POA: Diagnosis not present

## 2017-04-29 DIAGNOSIS — Z7982 Long term (current) use of aspirin: Secondary | ICD-10-CM | POA: Insufficient documentation

## 2017-04-29 DIAGNOSIS — I2510 Atherosclerotic heart disease of native coronary artery without angina pectoris: Secondary | ICD-10-CM | POA: Diagnosis not present

## 2017-04-29 DIAGNOSIS — I509 Heart failure, unspecified: Secondary | ICD-10-CM | POA: Diagnosis not present

## 2017-04-29 LAB — COMPREHENSIVE METABOLIC PANEL
ALK PHOS: 83 U/L (ref 38–126)
ALT: 18 U/L (ref 17–63)
AST: 28 U/L (ref 15–41)
Albumin: 4.3 g/dL (ref 3.5–5.0)
Anion gap: 6 (ref 5–15)
BUN: 12 mg/dL (ref 6–20)
CALCIUM: 9.8 mg/dL (ref 8.9–10.3)
CO2: 23 mmol/L (ref 22–32)
CREATININE: 1.22 mg/dL (ref 0.61–1.24)
Chloride: 109 mmol/L (ref 101–111)
GFR, EST NON AFRICAN AMERICAN: 54 mL/min — AB (ref >60)
Glucose, Bld: 101 mg/dL — ABNORMAL HIGH (ref 65–99)
Potassium: 4 mmol/L (ref 3.5–5.1)
Sodium: 138 mmol/L (ref 135–145)
Total Bilirubin: 0.7 mg/dL (ref 0.3–1.2)
Total Protein: 7.9 g/dL (ref 6.5–8.1)

## 2017-04-29 LAB — CBC
HCT: 42.5 % (ref 40.0–52.0)
Hemoglobin: 14.3 g/dL (ref 13.0–18.0)
MCH: 32.4 pg (ref 26.0–34.0)
MCHC: 33.7 g/dL (ref 32.0–36.0)
MCV: 96.2 fL (ref 80.0–100.0)
PLATELETS: 211 10*3/uL (ref 150–440)
RBC: 4.42 MIL/uL (ref 4.40–5.90)
RDW: 13.8 % (ref 11.5–14.5)
WBC: 5.3 10*3/uL (ref 3.8–10.6)

## 2017-04-29 LAB — LIPASE, BLOOD: Lipase: 59 U/L — ABNORMAL HIGH (ref 11–51)

## 2017-04-29 LAB — TROPONIN I

## 2017-04-29 MED ORDER — PANTOPRAZOLE SODIUM 40 MG PO TBEC
40.0000 mg | DELAYED_RELEASE_TABLET | Freq: Every day | ORAL | Status: DC
  Administered 2017-04-29: 40 mg via ORAL

## 2017-04-29 MED ORDER — PANTOPRAZOLE SODIUM 40 MG PO TBEC
DELAYED_RELEASE_TABLET | ORAL | Status: AC
  Filled 2017-04-29: qty 1

## 2017-04-29 MED ORDER — ONDANSETRON 4 MG PO TBDP
4.0000 mg | ORAL_TABLET | Freq: Once | ORAL | Status: AC
  Administered 2017-04-29: 4 mg via ORAL
  Filled 2017-04-29: qty 1

## 2017-04-29 NOTE — ED Notes (Signed)
Spoke with pt about wait times and what to expect next. Advised pt that I am available for further questions if needed.  

## 2017-04-29 NOTE — ED Notes (Signed)
Patient has several different complaints, low back , bilateral feet pain, nausea and vomiting.

## 2017-04-29 NOTE — ED Provider Notes (Signed)
[facility] Emergency Department Provider Note  ___________________________________________   First MD Initiated Contact with Patient 04/29/17 2031     (approximate)  I have reviewed the triage vital signs and the nursing notes.   HISTORY  Chief Complaint Abdominal Pain   HPI [name] is a 83 y.o. male with a history of CAD as well as GERD who is presenting with a burning sensation that he says is going from his upper abdomen up into his chest that is been on and off since 24 hours ago.  He says that he is out of his Protonix he was supposed to pick up a prescription at the pharmacy but he says that the pharmacy was out of his prescription.  He says that instead of picking up his Protonix he then took Maalox as a substitute.  However, he then vomited.  He is denying any shortness of breath, diaphoresis.  Says that this pain feels exactly like his reflux in the past.  Also complaining of chronic tingling and numbness to his bilateral hands and feet which he says is associated with his neuropathy.  Past Medical History:  Diagnosis Date  . Arthritis   . CAD (coronary artery disease)   . Congestive heart failure (CHF) (HCC) 06/14/2016   LVEF 40-45% 2016 echo; Dr. Darrold Junker  . COPD (chronic obstructive pulmonary disease) (HCC)   . Dementia   . Depression   . Diabetes mellitus without complication (HCC)   . Family history of adverse reaction to anesthesia    mom seizures   . GERD (gastroesophageal reflux disease)   . Heart disease   . History of blood clots in legs   . History of ischemic cardiomyopathy   . 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 Hyperlipidemia   . Hypertension   . Literacy level of illiterate   . Osteomyelitis (HCC)   . PN (peripheral neuropathy)     Patient Active Problem List   Diagnosis Date Noted  . Post-traumatic osteoarthritis of multiple joints 04/28/2017  . Abnormality of plasma protein 03/25/2017  . Elevated beta-2  microglobulin 03/25/2017  . Right leg DVT (HCC) 03/15/2017  . GERD (gastroesophageal reflux disease) 03/10/2017  . Hyperlipidemia, unspecified 03/10/2017  . Peripheral arterial occlusive disease (HCC) 11/24/2016  . Bilateral lower extremity edema 10/19/2016  . Congestive heart failure (CHF) (HCC) 06/14/2016  . Medication monitoring encounter 06/14/2016  . Hyperthyroidism 12/04/2015  . Unsteady gait 12/04/2015  . 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 Abnormal serum protein electrophoresis 12/05/2014  . Pressure ulcer 11/15/2014  . Hyperproteinemia 11/02/2014  . Hypoalbuminemia 11/02/2014  . Arthritis 10/23/2014  . Arteriosclerosis of coronary artery 10/23/2014  . Type 2 diabetes, controlled, with neuropathy (HCC) 10/23/2014  . Amputation of finger of left hand 10/23/2014  . History of pulmonary embolism 10/23/2014  . Literacy problem 10/23/2014  . Umbilical hernia without obstruction or gangrene 10/23/2014  . Peripheral neuropathy 10/23/2014  . History of osteomyelitis 10/23/2014  . Artificial cardiac pacemaker 08/10/2013  . H/O coronary artery bypass surgery 08/10/2013  . GIB (gastrointestinal bleeding) 07/18/2013  . Benign prostatic hyperplasia with urinary obstruction 09/27/2012  . Abnormal prostate specific antigen 09/27/2012  . Benign localized hyperplasia of prostate with urinary obstruction and other lower urinary tract symptoms (LUTS)(600.21) 09/27/2012  . Pulmonary embolism (HCC) 08/15/2012  . Cardiomyopathy (HCC) 08/09/2011    Past Surgical History:  Procedure Laterality Date  . amputation 4th finger  Left   . APPENDECTOMY    . CLAVICLE SURGERY  2012   open reduction and internal fixation of left clavicle  . CORONARY ARTERY BYPASS GRAFT  2002  . FOOT SURGERY    . IMPLANTABLE CARDIOVERTER DEFIBRILLATOR (ICD) GENERATOR CHANGE N/A 12/03/2015    Procedure: ICD GENERATOR CHANGE;  Surgeon: Marcina Millard, MD;  Location: [facility] ORS;  Service: Cardiovascular;  Laterality: N/A;  . PACEMAKER INSERTION      Prior to Admission medications   Medication Sig Start Date End Date Taking? Authorizing Provider  acetaminophen (TYLENOL) 500 MG tablet Take 500 mg by mouth every 8 (eight) hours as needed.    [provider]  Acidophilus Lactobacillus CAPS Take 1 capsule by mouth 2 (two) times daily.    [provider]  apixaban (ELIQUIS) 5 MG TABS tablet Take 10 mg twice a day for 7 days and then 5mg  twice a day until your doctor tells you to stop. 02/27/17   Nita Sickle, MD  aspirin EC 81 MG tablet Take 1 tablet (81 mg total) by mouth daily. 05/11/16   Kerman Passey, MD  atorvastatin (LIPITOR) 40 MG tablet Take 1 tablet (40 mg total) by mouth at bedtime. 12/17/16   Kerman Passey, MD  Elastic Bandages & Supports (TRUFORM STOCKINGS 20-30MMHG) MISC Wear on both legs; put on first thing in the morning and remove before bed; do NOT sleep in them 03/24/17   [name], Janit Bern, MD  Elastic Bandages & Supports (WRIST BRACE/RIGHT LARGE) MISC Wear on the right wrist, cock-up wrist splint for carpal tunnel 03/24/17   Kerman Passey, MD  furosemide (LASIX) 20 MG tablet Take 1 tablet (20 mg total) by mouth daily. 04/28/17   Karamalegos, Netta Neat, DO  gabapentin (NEURONTIN) 300 MG capsule Take 1 capsule (300 mg total) by mouth 3 (three) times daily. 04/28/17   Karamalegos, Netta Neat, DO  ibuprofen (ADVIL,MOTRIN) 200 MG tablet Take 200 mg by mouth every 6 (six) hours as needed. Pt takes twice a day, am/pm    [provider]  Omega-3 Fatty Acids (FISH OIL) 1000 MG CAPS Take 1,000 mg by mouth daily.    [provider]  pantoprazole (PROTONIX) 40 MG tablet Take 1 tablet (40 mg total) by mouth daily. 04/28/17   Karamalegos, Netta Neat, DO  potassium chloride (K-DUR) 10 MEQ tablet Take 1 tablet (10 mEq total) by mouth daily. 04/28/17    Karamalegos, Netta Neat, DO  Saw Palmetto, Serenoa repens, (SAW PALMETTO PO) Take 1 capsule by mouth daily.     [provider]  vitamin B-12 (CYANOCOBALAMIN) 500 MCG tablet Take 500 mcg by mouth daily.    [provider]    Allergies Oxycodone-acetaminophen and Percocet [oxycodone-acetaminophen]  Family History  Problem Relation Age of Onset  . Cancer Mother        spine  . Hypertension Mother   . Mental illness Mother   . Cancer Father        lung  . Asthma Father   . Allergic rhinitis Father   . Arthritis Father   . Tuberculosis Father   . Heart disease Father   . Gout Brother   . Allergic rhinitis Brother     Social History Social History   Tobacco Use  . Smoking status: Former Smoker    Packs/day: 0.25    Years: 10.00    Pack years: 2.50    Types: Cigarettes, Cigars    Last attempt to quit: 10/18/1979  Years since quitting: 37.5  . Smokeless tobacco: Never Used  Substance Use Topics  . Alcohol use: No  . Drug use: No    Review of Systems  Constitutional: No fever/chills Eyes: No visual changes. ENT: No sore throat. Cardiovascular: As above Respiratory: Denies shortness of breath. Gastrointestinal:   No diarrhea.  No constipation. Genitourinary: Negative for dysuria. Musculoskeletal: Negative for back pain. Skin: Negative for rash. Neurological: Negative for headaches, focal weakness or numbness.   ____________________________________________   PHYSICAL EXAM:  VITAL SIGNS: ED Triage Vitals [04/29/17 1416]  Enc Vitals Group     BP 138/81     Pulse Rate 86     Resp 18     Temp 97.6 F (36.4 C)     Temp Source Oral     SpO2 96 %     Weight 205 lb (93 kg)     Height 5\' 10"  (1.778 m)     Head Circumference      Peak Flow      Pain Score      Pain Loc      Pain Edu?      Excl. in GC?     Constitutional: Alert and oriented. Well appearing and in no acute distress. Eyes: Conjunctivae are normal.  Head:  Atraumatic. Nose: No congestion/rhinnorhea. Mouth/Throat: Mucous membranes are moist.  Neck: No stridor.   Cardiovascular: Normal rate, regular rhythm. Grossly normal heart sounds.   Respiratory: Normal respiratory effort.  No retractions. Lungs CTAB. Gastrointestinal: Soft and nontender. No distention.  Patient with a soft and easily reducible umbilical hernia which is about 3 cm and circular. Musculoskeletal: No lower extremity tenderness nor edema.  No joint effusions. Neurologic:  Normal speech and language. No gross focal neurologic deficits are appreciated. Skin:  Skin is warm, dry and intact. No rash noted. Psychiatric: Mood and affect are normal. Speech and behavior are normal.  ____________________________________________   LABS (all labs ordered are listed, but only abnormal results are displayed)  Labs Reviewed  LIPASE, BLOOD - Abnormal; Notable for the following components:      Result Value   Lipase 59 (*)    All other components within normal limits  COMPREHENSIVE METABOLIC PANEL - Abnormal; Notable for the following components:   Glucose, Bld 101 (*)    GFR calc non Af Amer 54 (*)    All other components within normal limits  CBC  TROPONIN I  URINALYSIS, COMPLETE (UACMP) WITH MICROSCOPIC   ____________________________________________  EKG  ED ECG REPORT I, Arelia Longestavid M Izabel Chim, the attending physician, personally viewed and interpreted this ECG.   Date: 04/29/2017  EKG Time: 2243  Rate: 63  Rhythm: 2 paced rhythm  Intervals:Wide-complex consistent with ventricular pacing  ST&T Change: T wave inversions in aVL as well as V2. No significant change from previous EKGs. ____________________________________________  RADIOLOGY  No active disease on the chest x-ray ____________________________________________   PROCEDURES  Procedure(s) performed:   Procedures  Critical Care performed:   ____________________________________________   INITIAL  IMPRESSION / ASSESSMENT AND PLAN / ED COURSE  Pertinent labs & imaging results that were available during my care of the patient were reviewed by me and considered in my medical decision making (see chart for details).  Differential diagnosis includes, but is not limited to, ACS, aortic dissection, pulmonary embolism, cardiac tamponade, pneumothorax, pneumonia, pericarditis, myocarditis, GI-related causes including esophagitis/gastritis, and musculoskeletal chest wall pain.   As part of my medical decision making, I reviewed the following data within the  electronic MEDICAL RECORD NUMBER Notes from prior ED visits  ----------------------------------------- 11:28 PM on 04/29/2017 -----------------------------------------  Patient is pain-free at this time.  Says that he will be able to pick up his Protonix tomorrow at the pharmacy.  Will be discharged at this time.  Very reassuring workup otherwise.  Do not suspect cardiac etiology. ____________________________________________   FINAL CLINICAL IMPRESSION(S) / ED DIAGNOSES  Chest pain.  GERD.    NEW MEDICATIONS STARTED DURING THIS VISIT:  New Prescriptions   No medications on file     Note:  This document was prepared using Dragon voice recognition software and may include unintentional dictation errors.     Myrna Blazer, MD 04/29/17 [phone]

## 2017-04-29 NOTE — ED Triage Notes (Signed)
Pt to ED via POV c/o abdominal pain and trouble swallowing. Pt states that he has hx/o acid reflux and ran out of his medications. Pt states that the reflux is so bad that it causes him to vomit. Pt in NAD at this time.

## 2017-04-30 NOTE — ED Notes (Signed)
Patient's grandson [name] called to come pick up him up.  Patient dc to lobby.

## 2017-05-04 ENCOUNTER — Telehealth: Payer: Self-pay | Admitting: Family Medicine

## 2017-05-04 DIAGNOSIS — F02818 Dementia in other diseases classified elsewhere, unspecified severity, with other behavioral disturbance: Secondary | ICD-10-CM

## 2017-05-04 DIAGNOSIS — F0281 Dementia in other diseases classified elsewhere with behavioral disturbance: Secondary | ICD-10-CM

## 2017-05-04 DIAGNOSIS — S069X9S Unspecified intracranial injury with loss of consciousness of unspecified duration, sequela: Secondary | ICD-10-CM

## 2017-05-04 DIAGNOSIS — R600 Localized edema: Secondary | ICD-10-CM

## 2017-05-04 DIAGNOSIS — E1140 Type 2 diabetes mellitus with diabetic neuropathy, unspecified: Secondary | ICD-10-CM

## 2017-05-04 DIAGNOSIS — G603 Idiopathic progressive neuropathy: Secondary | ICD-10-CM

## 2017-05-04 NOTE — Telephone Encounter (Signed)
Patient was last seen as new patient on 04/28/17. This complaint was one of the main things we discussed in the office.  He has known chronic peripheral neuropathy among other medical problems.  He was advised to increase his Gabapentin dose at that time. He was taking 300mg  capsules - twice daily. Now I advised him to increase to one capsule three times daily and [place] [facility] [place] [facility] at that [place].  He may need higher doses in future. Or he may benefit from a referral to Podiatrist or Neurologist in future.  We have not heard back yet as far as their request to get more help at home. I do think he would benefit from either Home Health or a Home Aide in the future.  Saralyn PilarAlexander Alantra Popoca, DO [facility] 05/04/2017, 12:52 PM

## 2017-05-04 NOTE — Telephone Encounter (Signed)
This is a duplicate telephone message.

## 2017-05-04 NOTE — Telephone Encounter (Signed)
Try calling patient the number doesn't work.

## 2017-05-04 NOTE — Telephone Encounter (Signed)
Pt. Called states that his feet was cold like a block of ice.Pt.call  Back # is  [phone]

## 2017-05-06 ENCOUNTER — Telehealth: Payer: Self-pay | Admitting: Student

## 2017-05-06 NOTE — Telephone Encounter (Signed)
Pt called about burning in feet.  He want to go somewhere he can get treatment for about 30 days [phone]

## 2017-05-06 NOTE — Telephone Encounter (Signed)
Patient called back today advised him as per Dr. Kirtland BouchardK and he is taking Gabapentin three times daily also advised him for referral and he doesn't want to any specialist referral and wants home health referral. Advised him that we can fill out paperwork and once they receive paperwork they will contact him. Patient wants follow up phone call [name] is aware.

## 2017-05-06 NOTE — Telephone Encounter (Signed)
[facility] [place]  Most likely will need to fax order signed today 05/06/17 and the last office visit note from 04/28/17 to initiate services.  If not covered we can change providers to either [facility] or [facility].  Additionally, he may take an additional pill of Gabapentin in evening, may take 300mg  one capsule in morning, 300mg  one capsule in afternoon, and TWO capsules for 600mg  in evening.  Saralyn PilarAlexander Karamalegos, DO [facility] 05/06/2017, 5:26 PM

## 2017-05-06 NOTE — Addendum Note (Signed)
Addended by: Smitty CordsKARAMALEGOS, Mickey Hebel J on: 05/06/2017 05:26 PM   Modules accepted: Orders

## 2017-05-11 ENCOUNTER — Emergency Department
Admission: EM | Admit: 2017-05-11 | Discharge: 2017-05-11 | Disposition: A | Discharge status: Home / Self Care | Payer: Medicare Other | Attending: Emergency Medicine | Admitting: Emergency Medicine

## 2017-05-11 ENCOUNTER — Other Ambulatory Visit: Payer: Self-pay

## 2017-05-11 ENCOUNTER — Telehealth: Payer: Self-pay | Admitting: Family Medicine

## 2017-05-11 DIAGNOSIS — G629 Polyneuropathy, unspecified: Secondary | ICD-10-CM | POA: Diagnosis not present

## 2017-05-11 DIAGNOSIS — R202 Paresthesia of skin: Secondary | ICD-10-CM | POA: Diagnosis present

## 2017-05-11 DIAGNOSIS — E119 Type 2 diabetes mellitus without complications: Secondary | ICD-10-CM | POA: Insufficient documentation

## 2017-05-11 DIAGNOSIS — Z79899 Other long term (current) drug therapy: Secondary | ICD-10-CM | POA: Insufficient documentation

## 2017-05-11 DIAGNOSIS — Z950 Presence of cardiac pacemaker: Secondary | ICD-10-CM | POA: Insufficient documentation

## 2017-05-11 DIAGNOSIS — Z7982 Long term (current) use of aspirin: Secondary | ICD-10-CM | POA: Diagnosis not present

## 2017-05-11 DIAGNOSIS — I110 Hypertensive heart disease with heart failure: Secondary | ICD-10-CM | POA: Insufficient documentation

## 2017-05-11 DIAGNOSIS — I509 Heart failure, unspecified: Secondary | ICD-10-CM | POA: Diagnosis not present

## 2017-05-11 DIAGNOSIS — G8929 Other chronic pain: Secondary | ICD-10-CM | POA: Diagnosis not present

## 2017-05-11 DIAGNOSIS — Z87891 Personal history of nicotine dependence: Secondary | ICD-10-CM | POA: Diagnosis not present

## 2017-05-11 DIAGNOSIS — Z951 Presence of aortocoronary bypass graft: Secondary | ICD-10-CM | POA: Diagnosis not present

## 2017-05-11 MED ORDER — GABAPENTIN 300 MG PO CAPS: 600.0000 mg | ORAL_CAPSULE | Freq: Three times a day (TID) | ORAL | 1 refills | Status: DC

## 2017-05-11 MED ORDER — GABAPENTIN 600 MG PO TABS
600.0000 mg | ORAL_TABLET | Freq: Once | ORAL | Status: AC
  Administered 2017-05-11: 600 mg via ORAL
  Filled 2017-05-11: qty 1

## 2017-05-11 NOTE — ED Triage Notes (Signed)
Pt states he has cold feet for the last few weeks - he went to the PCP and they told him to come to the ED

## 2017-05-11 NOTE — ED Notes (Signed)
PT verbalizes d.c instructions with rx given. PT understanding about home health follow up instructions. PT in NAD, VSS, pt in wheelchair to lobby to wait for ride at thsi time

## 2017-05-11 NOTE — ED Notes (Signed)
First Nurse Note:  Patient presents to the ED via EMS for cold hands and feet.  Patient states his hands have been cold for "years".  Patient reports feet feeling cold for several weeks.  Patient reports history of peripheral neuropathy.  Patient states he called his pcp multiple times this week with no response.  Patient very talkative and states, "I need to get a nice nurse and a nice doctor."

## 2017-05-11 NOTE — Telephone Encounter (Signed)
Scott a Child psychotherapistsocial worker with [facility] asked for a nurse to call pt about his medical issues.  Pt's number is [phone]

## 2017-05-11 NOTE — Discharge Instructions (Addendum)
We will attempt to improve your pain by increasing your Gabapentin slowly up to 2 pills per dose.  Increase to 2 pills at night dose for the next 3 nights.  Then increase to 2 pills mid-day with 2 pills at night for 3 days.  Then increase to 2 pills morning, 2 pills mid-day, and 2 pills at night going forward.  See Dr. Althea CharonKaramalegos for follow-up.

## 2017-05-11 NOTE — Telephone Encounter (Signed)
Agree with plan below. I have limited other options at this time initially, and we just saw him recently to establish and initiate some treatment changes. He has some difficulty with the prognosis with neuropathy and the chronic nature of this problem. We have given him advise after his appointment to adjust Gabapentin dose, and also [facility].  I [facility] same problem.  Saralyn PilarAlexander Vera Furniss, DO [facility] 05/11/2017, 4:56 PM

## 2017-05-11 NOTE — Telephone Encounter (Signed)
Called patient and he has a voice mail box that has not been set up and I was unable to speak with patient.  [facility] for feet pain.  I also call [facility] and they [facility].  I printed referral and faxed.  [facility] said they will open his case ASAP.

## 2017-05-11 NOTE — ED Provider Notes (Signed)
[facility] Emergency Department Provider Note ____________________________________________  Time seen: 1417  I have reviewed the triage vital signs and the nursing notes.  HISTORY  Chief Complaint  Foot Pain  HPI [name] is a 83 y.o. male presents to the ED from home, with a complaint of cold feet.  Patient with a history of diabetes, congestive heart failure, hypertension, hypertension of multiple joints, presents with a complaint of paresthesias to the hands and feet.  Patient is known to the ED for previous visits with similar complaints.  Patient denies any recent injury, accident, or trauma.  Patient also verbalizes some concern for performing ADLs given his chronic pain and paresthesias.  I reviewed the chart, and note that the patient has recently been discharged from care management due to poor compliance.  He also needs to follow-up with his new his primary care provider.  Patient describes the call for follow-up and it was unable to secure an appointment until next week on Tuesday.  Past Medical History:  Diagnosis Date  . Arthritis   . CAD (coronary artery disease)   . Congestive heart failure (CHF) (HCC) 06/14/2016   LVEF 40-45% 2016 echo; Dr. Darrold Junker  . COPD (chronic obstructive pulmonary disease) (HCC)   . Dementia   . Depression   . Diabetes mellitus without complication (HCC)   . Family history of adverse reaction to anesthesia    mom seizures   . GERD (gastroesophageal reflux disease)   . Heart disease   . History of blood clots in legs   . History of ischemic cardiomyopathy   . 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 Hyperlipidemia   . Hypertension   . Literacy level of illiterate   . Osteomyelitis (HCC)   . PN (peripheral neuropathy)     Patient Active Problem List   Diagnosis Date Noted  . Post-traumatic osteoarthritis of multiple joints 04/28/2017  . Abnormality of plasma protein 03/25/2017  . Elevated beta-2 microglobulin  03/25/2017  . Right leg DVT (HCC) 03/15/2017  . GERD (gastroesophageal reflux disease) 03/10/2017  . Hyperlipidemia, unspecified 03/10/2017  . Peripheral arterial occlusive disease (HCC) 11/24/2016  . Bilateral lower extremity edema 10/19/2016  . Congestive heart failure (CHF) (HCC) 06/14/2016  . Medication monitoring encounter 06/14/2016  . Hyperthyroidism 12/04/2015  . Unsteady gait 12/04/2015  . 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 Abnormal serum protein electrophoresis 12/05/2014  . Pressure ulcer 11/15/2014  . Hyperproteinemia 11/02/2014  . Hypoalbuminemia 11/02/2014  . Arthritis 10/23/2014  . Arteriosclerosis of coronary artery 10/23/2014  . Type 2 diabetes, controlled, with neuropathy (HCC) 10/23/2014  . Amputation of finger of left hand 10/23/2014  . History of pulmonary embolism 10/23/2014  . Literacy problem 10/23/2014  . Umbilical hernia without obstruction or gangrene 10/23/2014  . Peripheral neuropathy 10/23/2014  . History of osteomyelitis 10/23/2014  . Artificial cardiac pacemaker 08/10/2013  . H/O coronary artery bypass surgery 08/10/2013  . GIB (gastrointestinal bleeding) 07/18/2013  . Benign prostatic hyperplasia with urinary obstruction 09/27/2012  . Abnormal prostate specific antigen 09/27/2012  . Benign localized hyperplasia of prostate with urinary obstruction and other lower urinary tract symptoms (LUTS)(600.21) 09/27/2012  . Pulmonary embolism (HCC) 08/15/2012  . Cardiomyopathy (HCC) 08/09/2011    Past Surgical History:  Procedure Laterality Date  . amputation 4th finger Left   . APPENDECTOMY    . CLAVICLE SURGERY  2012   open reduction and internal fixation of left  clavicle  . CORONARY ARTERY BYPASS GRAFT  2002  . FOOT SURGERY    . IMPLANTABLE CARDIOVERTER DEFIBRILLATOR (ICD) GENERATOR CHANGE N/A 12/03/2015   Procedure: ICD  GENERATOR CHANGE;  Surgeon: Marcina Millard, MD;  Location: [facility] ORS;  Service: Cardiovascular;  Laterality: N/A;  . PACEMAKER INSERTION      Prior to Admission medications   Medication Sig Start Date End Date Taking? Authorizing Provider  acetaminophen (TYLENOL) 500 MG tablet Take 500 mg by mouth every 8 (eight) hours as needed.    [provider]  Acidophilus Lactobacillus CAPS Take 1 capsule by mouth 2 (two) times daily.    [provider]  apixaban (ELIQUIS) 5 MG TABS tablet Take 10 mg twice a day for 7 days and then 5mg  twice a day until your doctor tells you to stop. 02/27/17   Nita Sickle, MD  aspirin EC 81 MG tablet Take 1 tablet (81 mg total) by mouth daily. 05/11/16   Kerman Passey, MD  atorvastatin (LIPITOR) 40 MG tablet Take 1 tablet (40 mg total) by mouth at bedtime. 12/17/16   Kerman Passey, MD  Elastic Bandages & Supports (TRUFORM STOCKINGS 20-30MMHG) MISC Wear on both legs; put on first thing in the morning and remove before bed; do NOT sleep in them 03/24/17   [name], Janit Bern, MD  Elastic Bandages & Supports (WRIST BRACE/RIGHT LARGE) MISC Wear on the right wrist, cock-up wrist splint for carpal tunnel 03/24/17   Kerman Passey, MD  furosemide (LASIX) 20 MG tablet Take 1 tablet (20 mg total) by mouth daily. 04/28/17   Karamalegos, Netta Neat, DO  gabapentin (NEURONTIN) 300 MG capsule Take 1 capsule (300 mg total) by mouth 3 (three) times daily. 04/28/17   Karamalegos, Netta Neat, DO  ibuprofen (ADVIL,MOTRIN) 200 MG tablet Take 200 mg by mouth every 6 (six) hours as needed. Pt takes twice a day, am/pm    [provider]  Omega-3 Fatty Acids (FISH OIL) 1000 MG CAPS Take 1,000 mg by mouth daily.    [provider]  pantoprazole (PROTONIX) 40 MG tablet Take 1 tablet (40 mg total) by mouth daily. 04/28/17   Karamalegos, Netta Neat, DO  potassium chloride (K-DUR) 10 MEQ tablet Take 1 tablet (10 mEq total) by mouth daily. 04/28/17   Karamalegos,  Netta Neat, DO  Saw Palmetto, Serenoa repens, (SAW PALMETTO PO) Take 1 capsule by mouth daily.     [provider]  vitamin B-12 (CYANOCOBALAMIN) 500 MCG tablet Take 500 mcg by mouth daily.    [provider]    Allergies Oxycodone-acetaminophen and Percocet [oxycodone-acetaminophen]  Family History  Problem Relation Age of Onset  . Cancer Mother        spine  . Hypertension Mother   . Mental illness Mother   . Cancer Father        lung  . Asthma Father   . Allergic rhinitis Father   . Arthritis Father   . Tuberculosis Father   . Heart disease Father   . Gout Brother   . Allergic rhinitis Brother     Social History Social History   Tobacco Use  . Smoking status: Former Smoker    Packs/day: 0.25    Years: 10.00    Pack years: 2.50    Types: Cigarettes, Cigars    Last attempt to quit: 10/18/1979    Years since quitting: 37.5  . Smokeless tobacco: Never Used  Substance Use Topics  . Alcohol use: No  .  Drug use: No    Review of Systems  Constitutional: Negative for fever. Eyes: Negative for visual changes. ENT: Negative for sore throat. Cardiovascular: Negative for chest pain. Respiratory: Negative for shortness of breath. Gastrointestinal: Negative for abdominal pain, vomiting and diarrhea. Genitourinary: Negative for dysuria. Musculoskeletal: Negative for back pain. Skin: Negative for rash. Neurological: Negative for headaches, focal weakness or numbness.  Hand and foot paresthesias as above. ____________________________________________  PHYSICAL EXAM:  VITAL SIGNS: ED Triage Vitals  Enc Vitals Group     BP --      Pulse --      Resp --      Temp --      Temp src --      SpO2 --      Weight 05/11/17 1307 202 lb (91.6 kg)     Height 05/11/17 1307 6' (1.829 m)     Head Circumference --      Peak Flow --      Pain Score 05/11/17 1306 10     Pain Loc --      Pain Edu? --      Excl. in GC? --     Constitutional: Alert and  oriented. Well appearing and in no distress. Head: Normocephalic and atraumatic. Eyes: Conjunctivae are normal. Normal extraocular movements Cardiovascular: Normal rate, regular rhythm. Normal distal pulses. Respiratory: Normal respiratory effort. No wheezes/rales/rhonchi. Musculoskeletal: Nontender with normal range of motion in all extremities.  Neurologic:  Normal speech and language. No gross focal neurologic deficits are appreciated. Skin:  Skin is warm, dry and intact. No rash noted. Psychiatric: Mood and affect are normal. Patient exhibits appropriate insight and judgment. ____________________________________________  PROCEDURES  Procedures Gabapentin 600 mg PO ____________________________________________  INITIAL IMPRESSION / ASSESSMENT AND PLAN / ED COURSE  Patient with a ED evaluation of hand and foot paresthesias.  I have placed an order for home health evaluation for the patient to include PT, OT, and home health aide assistance with his ADLs.  Patient will be discharged with a prescription to allow him to dose his gabapentin and an eventual dose of 600 mg 3 times daily.  He is given a up dosing schedule to titrate up starting with his evening dose which we will provide here in the ED.  He will follow-up with his PCP for ongoing management.  Patient discharges without incident to be picked up by his grandson who is been notified of his discharge. ____________________________________________  FINAL CLINICAL IMPRESSION(S) / ED DIAGNOSES  Final diagnoses:  Other chronic pain  Neuropathy      Karmen StabsMenshew, Charlesetta IvoryJenise V Bacon, PA-C 05/11/17 1854    Nita SickleVeronese, [place], MD 05/14/17 1505

## 2017-05-13 ENCOUNTER — Ambulatory Visit: Payer: Self-pay | Admitting: Nurse Practitioner

## 2017-05-13 NOTE — Telephone Encounter (Signed)
Called patient no VM set up.

## 2017-05-20 ENCOUNTER — Other Ambulatory Visit: Payer: Self-pay | Admitting: Licensed Clinical Social Worker

## 2017-05-20 ENCOUNTER — Encounter: Payer: Self-pay | Admitting: Licensed Clinical Social Worker

## 2017-05-20 NOTE — Patient Outreach (Signed)
Assessment:  CSW spoke via phone with client, CSW verified client identity. CSW received verbal permission from client for CSW to speak with client about client needs.CSW informed client on 05/20/17 that client had met client's care plan goals with CSW services. Thus, CSW informed [place] on 05/20/17 that [place] would discharge client from [place] services on 05/20/17 since client had met client's care plan goals.Client agreed to this plan. Client is trying to see his new primary doctor as scheduled. Client said he had his prescribed medications. CSW thanked [place] for participating in [facility] program support. CSW congratulated client for meeting his care plan goals. CSW thanked [place] for phone call with CSW on 05/20/17.  Client was appreciative of [facility] program support in nursing and social work services.     Plan:  CSW is discharging client on 05/20/17 from [place] services since client has met client's care plan goals with CSW services.  CSW to fax physician case closure letter to Dr. Parks Ranger informing doctor that [place] [facility] discharged client from [place] services on 05/20/17.    Norva Riffle.Almon Whitford MSW, LCSW Licensed Clinical Social Worker [facility] Care Management [phone]

## 2017-05-24 ENCOUNTER — Encounter: Payer: Self-pay | Admitting: *Deleted

## 2017-05-24 ENCOUNTER — Emergency Department: Payer: Medicare Other

## 2017-05-24 ENCOUNTER — Other Ambulatory Visit: Payer: Self-pay

## 2017-05-24 ENCOUNTER — Emergency Department
Admission: EM | Admit: 2017-05-24 | Discharge: 2017-05-25 | Disposition: A | Discharge status: Home / Self Care | Payer: Medicare Other | Attending: Student in an Organized Health Care Education/Training Program | Admitting: Student in an Organized Health Care Education/Training Program

## 2017-05-24 DIAGNOSIS — Z950 Presence of cardiac pacemaker: Secondary | ICD-10-CM | POA: Insufficient documentation

## 2017-05-24 DIAGNOSIS — I110 Hypertensive heart disease with heart failure: Secondary | ICD-10-CM | POA: Diagnosis not present

## 2017-05-24 DIAGNOSIS — Z79899 Other long term (current) drug therapy: Secondary | ICD-10-CM | POA: Insufficient documentation

## 2017-05-24 DIAGNOSIS — Z7982 Long term (current) use of aspirin: Secondary | ICD-10-CM | POA: Diagnosis not present

## 2017-05-24 DIAGNOSIS — R2243 Localized swelling, mass and lump, lower limb, bilateral: Secondary | ICD-10-CM | POA: Diagnosis present

## 2017-05-24 DIAGNOSIS — F0390 Unspecified dementia without behavioral disturbance: Secondary | ICD-10-CM | POA: Diagnosis not present

## 2017-05-24 DIAGNOSIS — Z87891 Personal history of nicotine dependence: Secondary | ICD-10-CM | POA: Insufficient documentation

## 2017-05-24 DIAGNOSIS — E1140 Type 2 diabetes mellitus with diabetic neuropathy, unspecified: Secondary | ICD-10-CM | POA: Insufficient documentation

## 2017-05-24 DIAGNOSIS — I509 Heart failure, unspecified: Secondary | ICD-10-CM | POA: Diagnosis not present

## 2017-05-24 DIAGNOSIS — I2510 Atherosclerotic heart disease of native coronary artery without angina pectoris: Secondary | ICD-10-CM | POA: Insufficient documentation

## 2017-05-24 DIAGNOSIS — Z951 Presence of aortocoronary bypass graft: Secondary | ICD-10-CM | POA: Diagnosis not present

## 2017-05-24 DIAGNOSIS — M7989 Other specified soft tissue disorders: Secondary | ICD-10-CM | POA: Diagnosis not present

## 2017-05-24 DIAGNOSIS — I82511 Chronic embolism and thrombosis of right femoral vein: Secondary | ICD-10-CM | POA: Diagnosis not present

## 2017-05-24 DIAGNOSIS — J449 Chronic obstructive pulmonary disease, unspecified: Secondary | ICD-10-CM | POA: Diagnosis not present

## 2017-05-24 LAB — CBC WITH DIFFERENTIAL/PLATELET
BASOS ABS: 0 10*3/uL (ref 0–0.1)
BASOS PCT: 1 %
EOS ABS: 0.3 10*3/uL (ref 0–0.7)
EOS PCT: 5 %
HCT: 47.8 % (ref 40.0–52.0)
HEMOGLOBIN: 16 g/dL (ref 13.0–18.0)
LYMPHS ABS: 1.3 10*3/uL (ref 1.0–3.6)
Lymphocytes Relative: 22 %
MCH: 32 pg (ref 26.0–34.0)
MCHC: 33.5 g/dL (ref 32.0–36.0)
MCV: 95.5 fL (ref 80.0–100.0)
Monocytes Absolute: 0.6 10*3/uL (ref 0.2–1.0)
Monocytes Relative: 10 %
NEUTROS PCT: 62 %
Neutro Abs: 3.9 10*3/uL (ref 1.4–6.5)
PLATELETS: 183 10*3/uL (ref 150–440)
RBC: 5.01 MIL/uL (ref 4.40–5.90)
RDW: 14.3 % (ref 11.5–14.5)
WBC: 6.2 10*3/uL (ref 3.8–10.6)

## 2017-05-24 LAB — BASIC METABOLIC PANEL
ANION GAP: 7 (ref 5–15)
BUN: 12 mg/dL (ref 6–20)
CHLORIDE: 106 mmol/L (ref 101–111)
CO2: 27 mmol/L (ref 22–32)
Calcium: 9.9 mg/dL (ref 8.9–10.3)
Creatinine, Ser: 1.04 mg/dL (ref 0.61–1.24)
GFR calc non Af Amer: >60 mL/min (ref >60)
Glucose, Bld: 92 mg/dL (ref 65–99)
POTASSIUM: 4 mmol/L (ref 3.5–5.1)
SODIUM: 140 mmol/L (ref 135–145)

## 2017-05-24 MED ORDER — APIXABAN 5 MG PO TABS
5.0000 mg | ORAL_TABLET | Freq: Two times a day (BID) | ORAL | Status: DC
  Filled 2017-05-24: qty 1

## 2017-05-24 MED ORDER — CEPHALEXIN 500 MG PO CAPS
500.0000 mg | ORAL_CAPSULE | Freq: Once | ORAL | Status: AC
  Administered 2017-05-24: 500 mg via ORAL
  Filled 2017-05-24: qty 1

## 2017-05-24 MED ORDER — GABAPENTIN 300 MG PO CAPS
600.0000 mg | ORAL_CAPSULE | Freq: Three times a day (TID) | ORAL | Status: DC
  Administered 2017-05-24: 600 mg via ORAL
  Filled 2017-05-24 (×2): qty 2

## 2017-05-24 MED ORDER — APIXABAN 5 MG PO TABS
10.0000 mg | ORAL_TABLET | Freq: Two times a day (BID) | ORAL | Status: DC
  Administered 2017-05-24: 10 mg via ORAL
  Filled 2017-05-24: qty 2

## 2017-05-24 MED ORDER — APIXABAN 5 MG PO TABS: ORAL_TABLET | ORAL | 1 refills | Status: DC

## 2017-05-24 MED ORDER — CEPHALEXIN 500 MG PO CAPS: 500.0000 mg | ORAL_CAPSULE | Freq: Three times a day (TID) | ORAL | 0 refills | Status: AC

## 2017-05-24 MED ORDER — TRAMADOL HCL 50 MG PO TABS
50.0000 mg | ORAL_TABLET | Freq: Once | ORAL | Status: AC
  Administered 2017-05-24: 50 mg via ORAL
  Filled 2017-05-24: qty 1

## 2017-05-24 NOTE — Discharge Instructions (Signed)
Please be sure to keep your feet elevated to help with swelling and be sure to be taking your Eliquis as prescribed to prevent progression and worsening of the blood clot in your right leg which I do suspect is leading to your right leg swelling and pain.  Follow-up with podiatry care doctor.  Return to the ER for any worsening symptoms.  I have also given a referral for home health as well as physical therapy to visit you at home.

## 2017-05-24 NOTE — ED Notes (Signed)
Pt states only person who can pick him up is his grandson [name], [name] was calledx2 however went straight to voicemail that is full.

## 2017-05-24 NOTE — ED Notes (Signed)
Pt signed hard copy of ED discharge and placed on his chart. Signature pad not responding.

## 2017-05-24 NOTE — ED Notes (Signed)
Patient transported to X-ray 

## 2017-05-24 NOTE — ED Notes (Signed)
First Nurse Note:  Patient presents to the ED via EMS for right leg pain and swelling.  Leg appears reddened and swollen.

## 2017-05-24 NOTE — ED Triage Notes (Signed)
Pt to ED reporting pain in his lower back, bilateral hands, left arm, and both legs. Right leg pain is reported to be worse than the left. Increased swelling noted to right leg with redness. Pt able to move both feet but reports increased pain bilaterally when moving. No new injuries reported. Pt reports he fell the 27th last month and has had pain since then. Pt is alert and oriented in NAD. No noted increased WOB. No SOB.

## 2017-05-24 NOTE — ED Notes (Signed)

## 2017-05-24 NOTE — ED Provider Notes (Signed)
[facility] Emergency Department Provider Note    First MD Initiated Contact with Patient 05/24/17 1942     (approximate)  I have reviewed the triage vital signs and the nursing notes.   HISTORY  Chief Complaint Generalized Body Aches    HPI [name] is a 83 y.o. male extensive past medical history including dementia depression chronic lower extremity edema CAD as well as history of DVT on Eliquis presents with persistent right foot pain neuropathy and chronic pain syndrome.  Patient states he is having difficulty walking at home with a walker due to his pain.  Patient with multiple visits to the ER and his PCP for similar symptoms.  Denies any fevers.  States he has been compliant with his medications.  Denies any chest pain or shortness of breath at this time.  Past Medical History:  Diagnosis Date  . Arthritis   . CAD (coronary artery disease)   . Congestive heart failure (CHF) (HCC) 06/14/2016   LVEF 40-45% 2016 echo; Dr. Darrold JunkerParaschos  . COPD (chronic obstructive pulmonary disease) (HCC)   . Dementia   . Depression   . Diabetes mellitus without complication (HCC)   . Family history of adverse reaction to anesthesia    mom seizures   . GERD (gastroesophageal reflux disease)   . Heart disease   . History of blood clots in legs   . History of ischemic cardiomyopathy   . 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 Hyperlipidemia   . Hypertension   . Literacy level of illiterate   . Osteomyelitis (HCC)   . PN (peripheral neuropathy)    Family History  Problem Relation Age of Onset  . Cancer Mother        spine  . Hypertension Mother   . Mental illness Mother   . Cancer Father        lung  . Asthma Father   . Allergic rhinitis Father   . Arthritis Father   . Tuberculosis Father   . Heart disease Father   . Gout Brother   . Allergic rhinitis Brother    Past Surgical History:  Procedure Laterality Date  . amputation 4th finger Left   .  APPENDECTOMY    . CLAVICLE SURGERY  2012   open reduction and internal fixation of left clavicle  . CORONARY ARTERY BYPASS GRAFT  2002  . FOOT SURGERY    . IMPLANTABLE CARDIOVERTER DEFIBRILLATOR (ICD) GENERATOR CHANGE N/A 12/03/2015   Procedure: ICD GENERATOR CHANGE;  Surgeon: Marcina MillardAlexander Paraschos, MD;  Location: [facility] ORS;  Service: Cardiovascular;  Laterality: N/A;  . PACEMAKER INSERTION     Patient Active Problem List   Diagnosis Date Noted  . Post-traumatic osteoarthritis of multiple joints 04/28/2017  . Abnormality of plasma protein 03/25/2017  . Elevated beta-2 microglobulin 03/25/2017  . Right leg DVT (HCC) 03/15/2017  . GERD (gastroesophageal reflux disease) 03/10/2017  . Hyperlipidemia, unspecified 03/10/2017  . Peripheral arterial occlusive disease (HCC) 11/24/2016  . Bilateral lower extremity edema 10/19/2016  . Congestive heart failure (CHF) (HCC) 06/14/2016  . Medication monitoring encounter 06/14/2016  . Hyperthyroidism 12/04/2015  . Unsteady gait 12/04/2015  . 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 Abnormal serum protein electrophoresis 12/05/2014  . Pressure ulcer 11/15/2014  . Hyperproteinemia 11/02/2014  . Hypoalbuminemia 11/02/2014  . Arthritis 10/23/2014  . Arteriosclerosis of coronary artery 10/23/2014  . Type 2 diabetes, controlled, with neuropathy (HCC) 10/23/2014  .  Amputation of finger of left hand 10/23/2014  . History of pulmonary embolism 10/23/2014  . Literacy problem 10/23/2014  . Umbilical hernia without obstruction or gangrene 10/23/2014  . Peripheral neuropathy 10/23/2014  . History of osteomyelitis 10/23/2014  . Artificial cardiac pacemaker 08/10/2013  . H/O coronary artery bypass surgery 08/10/2013  . GIB (gastrointestinal bleeding) 07/18/2013  . Benign prostatic hyperplasia with urinary obstruction 09/27/2012  . Abnormal  prostate specific antigen 09/27/2012  . Benign localized hyperplasia of prostate with urinary obstruction and other lower urinary tract symptoms (LUTS)(600.21) 09/27/2012  . Pulmonary embolism (HCC) 08/15/2012  . Cardiomyopathy (HCC) 08/09/2011      Prior to Admission medications   Medication Sig Start Date End Date Taking? Authorizing Provider  acetaminophen (TYLENOL) 500 MG tablet Take 500 mg by mouth every 8 (eight) hours as needed.    [provider]  Acidophilus Lactobacillus CAPS Take 1 capsule by mouth 2 (two) times daily.    [provider]  apixaban (ELIQUIS) 5 MG TABS tablet Take 10 mg twice a day for 7 days and then 5mg  twice a day until your doctor tells you to stop. 02/27/17   Nita Sickle, MD  aspirin EC 81 MG tablet Take 1 tablet (81 mg total) by mouth daily. 05/11/16   Kerman Passey, MD  atorvastatin (LIPITOR) 40 MG tablet Take 1 tablet (40 mg total) by mouth at bedtime. 12/17/16   Kerman Passey, MD  Elastic Bandages & Supports (TRUFORM STOCKINGS 20-30MMHG) MISC Wear on both legs; put on first thing in the morning and remove before bed; do NOT sleep in them 03/24/17   [name], Janit Bern, MD  Elastic Bandages & Supports (WRIST BRACE/RIGHT LARGE) MISC Wear on the right wrist, cock-up wrist splint for carpal tunnel 03/24/17   Kerman Passey, MD  furosemide (LASIX) 20 MG tablet Take 1 tablet (20 mg total) by mouth daily. 04/28/17   Karamalegos, Netta Neat, DO  gabapentin (NEURONTIN) 300 MG capsule Take 1 capsule (300 mg total) by mouth 3 (three) times daily. 04/28/17   Karamalegos, Netta Neat, DO  gabapentin (NEURONTIN) 300 MG capsule Take 2 capsules (600 mg total) by mouth 3 (three) times daily. Take 1 pill AM, Take 1 pill Mid-day, Take 2 pills PM x 3 days Take 1 pill AM, Take 2 pills Mid-day, Take 2 pills PM x 3 days Take 2 pills AM, Take 2 pills Mid-day, Take 2 pills PM 05/11/17 06/10/17  Menshew, Charlesetta Ivory, PA-C  ibuprofen (ADVIL,MOTRIN) 200 MG tablet Take  200 mg by mouth every 6 (six) hours as needed. Pt takes twice a day, am/pm    [provider]  Omega-3 Fatty Acids (FISH OIL) 1000 MG CAPS Take 1,000 mg by mouth daily.    [provider]  pantoprazole (PROTONIX) 40 MG tablet Take 1 tablet (40 mg total) by mouth daily. 04/28/17   Karamalegos, Netta Neat, DO  potassium chloride (K-DUR) 10 MEQ tablet Take 1 tablet (10 mEq total) by mouth daily. 04/28/17   Karamalegos, Netta Neat, DO  Saw Palmetto, Serenoa repens, (SAW PALMETTO PO) Take 1 capsule by mouth daily.     [provider]  vitamin B-12 (CYANOCOBALAMIN) 500 MCG tablet Take 500 mcg by mouth daily.    [provider]    Allergies Oxycodone-acetaminophen and Percocet [oxycodone-acetaminophen]    Social History Social History   Tobacco Use  . Smoking status: Former Smoker    Packs/day: 0.25    Years: 10.00    Pack  years: 2.50    Types: Cigarettes, Cigars    Last attempt to quit: 10/18/1979    Years since quitting: 37.6  . Smokeless tobacco: Never Used  Substance Use Topics  . Alcohol use: No  . Drug use: No    Review of Systems Patient denies headaches, rhinorrhea, blurry vision, numbness, shortness of breath, chest pain, edema, cough, abdominal pain, nausea, vomiting, diarrhea, dysuria, fevers, rashes or hallucinations unless otherwise stated above in HPI. ____________________________________________   PHYSICAL EXAM:  VITAL SIGNS: Vitals:   05/24/17 1347 05/24/17 1821  BP: (!) 151/75 (!) 162/64  Pulse: 63 (!) 56  Resp: 18 16  Temp: 97.7 F (36.5 C) (!) 97.5 F (36.4 C)  SpO2: 98% 97%    Constitutional: Alert and oriented, in no acute distress. Eyes: Conjunctivae are normal.  Head: Atraumatic. Nose: No congestion/rhinnorhea. Mouth/Throat: Mucous membranes are moist.   Neck: No stridor. Painless ROM.  Cardiovascular: Normal rate, regular rhythm. Grossly normal heart sounds.  Good peripheral circulation. Respiratory: Normal  respiratory effort.  No retractions. Lungs CTAB. Gastrointestinal: Soft and nontender. No distention. No abdominal bruits. No CVA tenderness. Genitourinary:  Musculoskeletal: Right greater than left lower extremity edema.  He does have trauma to the third toe with nail avulsion no underlying nailbed injury.  Does have erythema to the right foot.  Does have brisk cap refill palpable DP and PT pulses.  Foot is warm to touch right greater than left.  Does clinically appear consistent with mild component of either venous stasis but possible cellulitis given the wound.  Also has avulsion injury to the left toe no underlying nailbed injury.  No joint effusions. Neurologic:  Normal speech and language. No gross focal neurologic deficits are appreciated. No facial droop Skin:  Skin is warm, dry and intact. No rash noted. Psychiatric: Mood and affect are normal. Speech and behavior are normal.  ____________________________________________   LABS (all labs ordered are listed, but only abnormal results are displayed)  Results for orders placed or performed during the hospital encounter of 05/24/17 (from the past 24 hour(s))  CBC with Differential/Platelet     Status: None   Collection Time: 05/24/17  8:06 PM  Result Value Ref Range   WBC 6.2 3.8 - 10.6 K/uL   RBC 5.01 4.40 - 5.90 MIL/uL   Hemoglobin 16.0 13.0 - 18.0 g/dL   HCT 16.1 09.6 - 04.5 %   MCV 95.5 80.0 - 100.0 fL   MCH 32.0 26.0 - 34.0 pg   MCHC 33.5 32.0 - 36.0 g/dL   RDW 40.9 81.1 - 91.4 %   Platelets 183 150 - 440 K/uL   Neutrophils Relative % 62 %   Neutro Abs 3.9 1.4 - 6.5 K/uL   Lymphocytes Relative 22 %   Lymphs Abs 1.3 1.0 - 3.6 K/uL   Monocytes Relative 10 %   Monocytes Absolute 0.6 0.2 - 1.0 K/uL   Eosinophils Relative 5 %   Eosinophils Absolute 0.3 0 - 0.7 K/uL   Basophils Relative 1 %   Basophils Absolute 0.0 0 - 0.1 K/uL  Basic metabolic panel     Status: None   Collection Time: 05/24/17  8:06 PM  Result Value Ref  Range   Sodium 140 135 - 145 mmol/L   Potassium 4.0 3.5 - 5.1 mmol/L   Chloride 106 101 - 111 mmol/L   CO2 27 22 - 32 mmol/L   Glucose, Bld 92 65 - 99 mg/dL   BUN 12 6 - 20 mg/dL  Creatinine, Ser 1.04 0.61 - 1.24 mg/dL   Calcium 9.9 8.9 - 16.1 mg/dL   GFR calc non Af Amer >60 >60 mL/min   GFR calc Af Amer >60 >60 mL/min   Anion gap 7 5 - 15   ____________________________________________ ____________________________________________  RADIOLOGY  I personally reviewed all radiographic images ordered to evaluate for the above acute complaints and reviewed radiology reports and findings.  These findings were personally discussed with the patient.  Please see medical record for radiology report.  ____________________________________________   PROCEDURES  Procedure(s) performed:  Procedures    Critical Care performed: no ____________________________________________   INITIAL IMPRESSION / ASSESSMENT AND PLAN / ED COURSE  Pertinent labs & imaging results that were available during my care of the patient were reviewed by me and considered in my medical decision making (see chart for details).  DDX: dvt, cellulitis, fracture, neuropathy, claudication  [name] is a 83 y.o. who presents to the ED with symptoms as described above.  Patient well-known to this department.  He does not have any evidence of toxicity but does have multiple comorbidities.  Blood work sent for the above differential is reassuring and shows no evidence of sepsis AK I renal failure.  He does have good peripheral pulses I do not believe this is reflective of acute ischemic limb.  Ultrasound ordered to evaluate for DVT shows progression of previously documented thrombus.  I do suspect some component of his pain is coming from probable mild cellulitis given his poor toe condition with broken toenails providing a nidus for infection therefore we will give him Keflex.  I do suspect there is some noncompliance  with his Eliquis because he states that his previous physician told him to stop this despite having documented DVT.  Does not have any evidence of extensive occlusive thrombus as there is some adequate recannulization.  I do believe he is appropriate for further outpatient follow-up with podiatry as well as vascular surgery.  I have restarted his Eliquis.  Discussed signs and symptoms for which he should follow-up in the ER.  I have also reordered social work PT OT consultation for home health as well as wound care to evaluate status of his wounds.      As part of my medical decision making, I reviewed the following data within the electronic MEDICAL RECORD NUMBER Nursing notes reviewed and incorporated, Labs reviewed, notes from prior ED visits and [place] Controlled Substance Database   ____________________________________________   FINAL CLINICAL IMPRESSION(S) / ED DIAGNOSES  Final diagnoses:  Leg swelling  Chronic deep vein thrombosis (DVT) of femoral vein of right lower extremity (HCC)      NEW MEDICATIONS STARTED DURING THIS VISIT:  New Prescriptions   No medications on file     Note:  This document was prepared using Dragon voice recognition software and may include unintentional dictation errors.     Willy Eddy, MD 05/24/17 2302

## 2017-05-26 ENCOUNTER — Telehealth: Payer: Self-pay | Admitting: Family Medicine

## 2017-05-26 NOTE — Telephone Encounter (Signed)
Pt. Grandson call ed requesting to fill pt  [facility]  [address]

## 2017-06-01 ENCOUNTER — Observation Stay
Admission: EM | Admit: 2017-06-01 | Discharge: 2017-06-04 | Disposition: A | Discharge status: Skilled Nursing Facility | Payer: Medicare Other | Attending: Internal Medicine | Admitting: Internal Medicine

## 2017-06-01 ENCOUNTER — Other Ambulatory Visit: Payer: Self-pay

## 2017-06-01 ENCOUNTER — Emergency Department: Payer: Medicare Other

## 2017-06-01 DIAGNOSIS — Z86718 Personal history of other venous thrombosis and embolism: Secondary | ICD-10-CM | POA: Diagnosis not present

## 2017-06-01 DIAGNOSIS — K219 Gastro-esophageal reflux disease without esophagitis: Secondary | ICD-10-CM | POA: Diagnosis not present

## 2017-06-01 DIAGNOSIS — L409 Psoriasis, unspecified: Secondary | ICD-10-CM | POA: Insufficient documentation

## 2017-06-01 DIAGNOSIS — R52 Pain, unspecified: Secondary | ICD-10-CM

## 2017-06-01 DIAGNOSIS — M79604 Pain in right leg: Secondary | ICD-10-CM | POA: Diagnosis present

## 2017-06-01 DIAGNOSIS — I110 Hypertensive heart disease with heart failure: Secondary | ICD-10-CM | POA: Insufficient documentation

## 2017-06-01 DIAGNOSIS — Z87891 Personal history of nicotine dependence: Secondary | ICD-10-CM | POA: Insufficient documentation

## 2017-06-01 DIAGNOSIS — M79605 Pain in left leg: Secondary | ICD-10-CM

## 2017-06-01 DIAGNOSIS — E1140 Type 2 diabetes mellitus with diabetic neuropathy, unspecified: Secondary | ICD-10-CM | POA: Insufficient documentation

## 2017-06-01 DIAGNOSIS — Z951 Presence of aortocoronary bypass graft: Secondary | ICD-10-CM | POA: Insufficient documentation

## 2017-06-01 DIAGNOSIS — F0390 Unspecified dementia without behavioral disturbance: Secondary | ICD-10-CM | POA: Diagnosis not present

## 2017-06-01 DIAGNOSIS — J449 Chronic obstructive pulmonary disease, unspecified: Secondary | ICD-10-CM | POA: Diagnosis not present

## 2017-06-01 DIAGNOSIS — I255 Ischemic cardiomyopathy: Secondary | ICD-10-CM | POA: Diagnosis not present

## 2017-06-01 DIAGNOSIS — Z885 Allergy status to narcotic agent status: Secondary | ICD-10-CM | POA: Diagnosis not present

## 2017-06-01 DIAGNOSIS — F329 Major depressive disorder, single episode, unspecified: Secondary | ICD-10-CM | POA: Insufficient documentation

## 2017-06-01 DIAGNOSIS — M064 Inflammatory polyarthropathy: Secondary | ICD-10-CM | POA: Diagnosis not present

## 2017-06-01 DIAGNOSIS — Z7901 Long term (current) use of anticoagulants: Secondary | ICD-10-CM | POA: Diagnosis not present

## 2017-06-01 DIAGNOSIS — Z7982 Long term (current) use of aspirin: Secondary | ICD-10-CM | POA: Diagnosis not present

## 2017-06-01 DIAGNOSIS — Z79899 Other long term (current) drug therapy: Secondary | ICD-10-CM | POA: Insufficient documentation

## 2017-06-01 DIAGNOSIS — M25561 Pain in right knee: Secondary | ICD-10-CM | POA: Diagnosis present

## 2017-06-01 DIAGNOSIS — I5022 Chronic systolic (congestive) heart failure: Secondary | ICD-10-CM | POA: Insufficient documentation

## 2017-06-01 DIAGNOSIS — I2510 Atherosclerotic heart disease of native coronary artery without angina pectoris: Secondary | ICD-10-CM | POA: Diagnosis not present

## 2017-06-01 DIAGNOSIS — G8929 Other chronic pain: Secondary | ICD-10-CM | POA: Insufficient documentation

## 2017-06-01 DIAGNOSIS — Z95810 Presence of automatic (implantable) cardiac defibrillator: Secondary | ICD-10-CM | POA: Insufficient documentation

## 2017-06-01 DIAGNOSIS — E785 Hyperlipidemia, unspecified: Secondary | ICD-10-CM | POA: Insufficient documentation

## 2017-06-01 HISTORY — DX: Psoriasis, unspecified: L40.9

## 2017-06-01 MED ORDER — IBUPROFEN 400 MG PO TABS
200.0000 mg | ORAL_TABLET | Freq: Four times a day (QID) | ORAL | Status: DC | PRN
Start: 2017-06-01 — End: 2017-06-04
  Administered 2017-06-02 – 2017-06-03 (×3): 200 mg via ORAL
  Filled 2017-06-01 (×3): qty 1

## 2017-06-01 MED ORDER — TRAMADOL HCL 50 MG PO TABS
100.0000 mg | ORAL_TABLET | Freq: Once | ORAL | Status: AC
  Administered 2017-06-01: 100 mg via ORAL
  Filled 2017-06-01: qty 2

## 2017-06-01 MED ORDER — ACETAMINOPHEN 500 MG PO TABS: 500.0000 mg | ORAL_TABLET | Freq: Three times a day (TID) | ORAL | Status: DC | PRN

## 2017-06-01 MED ORDER — ATORVASTATIN CALCIUM 20 MG PO TABS
40.0000 mg | ORAL_TABLET | Freq: Every day | ORAL | Status: DC
  Filled 2017-06-01: qty 2

## 2017-06-01 MED ORDER — ASPIRIN EC 81 MG PO TBEC
81.0000 mg | DELAYED_RELEASE_TABLET | Freq: Every day | ORAL | Status: DC
  Administered 2017-06-02 – 2017-06-04 (×3): 81 mg via ORAL
  Filled 2017-06-01 (×3): qty 1

## 2017-06-01 MED ORDER — PANTOPRAZOLE SODIUM 40 MG PO TBEC
40.0000 mg | DELAYED_RELEASE_TABLET | Freq: Every day | ORAL | Status: DC
  Administered 2017-06-02 – 2017-06-04 (×3): 40 mg via ORAL
  Filled 2017-06-01 (×3): qty 1

## 2017-06-01 MED ORDER — APIXABAN 5 MG PO TABS
5.0000 mg | ORAL_TABLET | Freq: Two times a day (BID) | ORAL | Status: DC
  Administered 2017-06-02 – 2017-06-04 (×5): 5 mg via ORAL
  Filled 2017-06-01 (×6): qty 1

## 2017-06-01 MED ORDER — GABAPENTIN 300 MG PO CAPS
600.0000 mg | ORAL_CAPSULE | Freq: Three times a day (TID) | ORAL | Status: DC
  Administered 2017-06-02 – 2017-06-04 (×9): 600 mg via ORAL
  Filled 2017-06-01 (×11): qty 2

## 2017-06-01 MED ORDER — GABAPENTIN 300 MG PO CAPS
300.0000 mg | ORAL_CAPSULE | Freq: Three times a day (TID) | ORAL | Status: DC
  Filled 2017-06-01: qty 1

## 2017-06-01 NOTE — Discharge Instructions (Addendum)
Keep legs elevated.  Be sure to wear your compression stockings that will help with the pain.  Follow-up with vascular surgery as well as podiatry as recommended  Follow-up with primary care physician in 4 to 5 days or sooner as needed Follow-up with rheumatology as needed

## 2017-06-01 NOTE — ED Notes (Signed)
Patient given sandwich tray and decaf coffee

## 2017-06-01 NOTE — ED Provider Notes (Signed)
[facility] Emergency Department Provider Note    First MD Initiated Contact with Patient 06/01/17 1801     (approximate)  I have reviewed the triage vital signs and the nursing notes.   HISTORY  Chief Complaint Leg Pain    HPI [name] is a 83 y.o. male with a history of dementia depression as well as CAD heart failure COPD chronic lower extremity swelling on Eliquis for history of DVT who I recently saw just 1 week ago presents with persistent and similar leg pain and stating he is having difficulty walking.  Denies any fevers.  States is a burning sensation in the bottom of his feet and intermittently in his hands.  The symptoms have been ongoing for several weeks.  Denies any chest pain or shortness of breath.  No abdominal pain.  Came to the ER today because he was frustrated because his home health nurse did not come out to see him.  Patient currently on the phone yelling at nurse from the home health service asking why they did not come out so he called EMS.  Past Medical History:  Diagnosis Date  . Arthritis   . CAD (coronary artery disease)   . Congestive heart failure (CHF) (HCC) 06/14/2016   LVEF 40-45% 2016 echo; Dr. Darrold JunkerParaschos  . COPD (chronic obstructive pulmonary disease) (HCC)   . Dementia   . Depression   . Diabetes mellitus without complication (HCC)   . Family history of adverse reaction to anesthesia    mom seizures   . GERD (gastroesophageal reflux disease)   . Heart disease   . History of blood clots in legs   . History of ischemic cardiomyopathy   . 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 Hyperlipidemia   . Hypertension   . Literacy level of illiterate   . Osteomyelitis (HCC)   . PN (peripheral neuropathy)    Family History  Problem Relation Age of Onset  . Cancer Mother        spine  . Hypertension Mother   . Mental illness Mother   . Cancer Father        lung  . Asthma Father   . Allergic rhinitis Father   .  Arthritis Father   . Tuberculosis Father   . Heart disease Father   . Gout Brother   . Allergic rhinitis Brother    Past Surgical History:  Procedure Laterality Date  . amputation 4th finger Left   . APPENDECTOMY    . CLAVICLE SURGERY  2012   open reduction and internal fixation of left clavicle  . CORONARY ARTERY BYPASS GRAFT  2002  . FOOT SURGERY    . IMPLANTABLE CARDIOVERTER DEFIBRILLATOR (ICD) GENERATOR CHANGE N/A 12/03/2015   Procedure: ICD GENERATOR CHANGE;  Surgeon: Marcina MillardAlexander Paraschos, MD;  Location: [facility] ORS;  Service: Cardiovascular;  Laterality: N/A;  . PACEMAKER INSERTION     Patient Active Problem List   Diagnosis Date Noted  . Post-traumatic osteoarthritis of multiple joints 04/28/2017  . Abnormality of plasma protein 03/25/2017  . Elevated beta-2 microglobulin 03/25/2017  . Right leg DVT (HCC) 03/15/2017  . GERD (gastroesophageal reflux disease) 03/10/2017  . Hyperlipidemia, unspecified 03/10/2017  . Peripheral arterial occlusive disease (HCC) 11/24/2016  . Bilateral lower extremity edema 10/19/2016  . Congestive heart failure (CHF) (HCC) 06/14/2016  . Medication monitoring encounter 06/14/2016  . Hyperthyroidism 12/04/2015  . Unsteady gait 12/04/2015  . 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 Abnormal serum protein electrophoresis 12/05/2014  . Pressure ulcer 11/15/2014  . Hyperproteinemia 11/02/2014  . Hypoalbuminemia 11/02/2014  . Arthritis 10/23/2014  . Arteriosclerosis of coronary artery 10/23/2014  . Type 2 diabetes, controlled, with neuropathy (HCC) 10/23/2014  . Amputation of finger of left hand 10/23/2014  . History of pulmonary embolism 10/23/2014  . Literacy problem 10/23/2014  . Umbilical hernia without obstruction or gangrene 10/23/2014  . Peripheral neuropathy 10/23/2014  . History of osteomyelitis 10/23/2014  . Artificial  cardiac pacemaker 08/10/2013  . H/O coronary artery bypass surgery 08/10/2013  . GIB (gastrointestinal bleeding) 07/18/2013  . Benign prostatic hyperplasia with urinary obstruction 09/27/2012  . Abnormal prostate specific antigen 09/27/2012  . Benign localized hyperplasia of prostate with urinary obstruction and other lower urinary tract symptoms (LUTS)(600.21) 09/27/2012  . Pulmonary embolism (HCC) 08/15/2012  . Cardiomyopathy (HCC) 08/09/2011      Prior to Admission medications   Medication Sig Start Date End Date Taking? Authorizing Provider  acetaminophen (TYLENOL) 500 MG tablet Take 500 mg by mouth every 8 (eight) hours as needed for mild pain or fever.    Yes [provider]  apixaban (ELIQUIS) 5 MG TABS tablet Take 10 mg twice a day for 7 days and then 5mg  twice a day until your doctor tells you to stop. 05/24/17  Yes Willy Eddy, MD  Elastic Bandages & Supports (TRUFORM STOCKINGS 20-30MMHG) MISC Wear on both legs; put on first thing in the morning and remove before bed; do NOT sleep in them 03/24/17  Yes [name], Janit Bern, MD  Elastic Bandages & Supports (WRIST BRACE/RIGHT LARGE) MISC Wear on the right wrist, cock-up wrist splint for carpal tunnel 03/24/17  Yes [name], Janit Bern, MD  Acidophilus Lactobacillus CAPS Take 1 capsule by mouth 2 (two) times daily.    [provider]  aspirin EC 81 MG tablet Take 1 tablet (81 mg total) by mouth daily. 05/11/16   Kerman Passey, MD  atorvastatin (LIPITOR) 40 MG tablet Take 1 tablet (40 mg total) by mouth at bedtime. 12/17/16   Kerman Passey, MD  furosemide (LASIX) 20 MG tablet Take 1 tablet (20 mg total) by mouth daily. 04/28/17   Karamalegos, Netta Neat, DO  gabapentin (NEURONTIN) 300 MG capsule Take 1 capsule (300 mg total) by mouth 3 (three) times daily. 04/28/17   Karamalegos, Netta Neat, DO  gabapentin (NEURONTIN) 300 MG capsule Take 2 capsules (600 mg total) by mouth 3 (three) times daily. Take 1 pill AM, Take 1 pill  Mid-day, Take 2 pills PM x 3 days Take 1 pill AM, Take 2 pills Mid-day, Take 2 pills PM x 3 days Take 2 pills AM, Take 2 pills Mid-day, Take 2 pills PM 05/11/17 06/10/17  Menshew, Charlesetta Ivory, PA-C  ibuprofen (ADVIL,MOTRIN) 200 MG tablet Take 200 mg by mouth every 6 (six) hours as needed. Pt takes twice a day, am/pm    [provider]  Omega-3 Fatty Acids (FISH OIL) 1000 MG CAPS Take 1,000 mg by mouth daily.    [provider]  pantoprazole (PROTONIX) 40 MG tablet Take 1 tablet (40 mg total) by mouth daily. 04/28/17   Karamalegos, Netta Neat, DO  potassium chloride (K-DUR) 10 MEQ tablet Take 1 tablet (10 mEq total) by mouth daily. 04/28/17   Karamalegos, Netta Neat, DO  Saw Palmetto, Serenoa repens, (SAW PALMETTO PO) Take 1 capsule by mouth daily.     [provider]  vitamin B-12 (  CYANOCOBALAMIN) 500 MCG tablet Take 500 mcg by mouth daily.    [provider]    Allergies Oxycodone-acetaminophen and Percocet [oxycodone-acetaminophen]    Social History Social History   Tobacco Use  . Smoking status: Former Smoker    Packs/day: 0.25    Years: 10.00    Pack years: 2.50    Types: Cigarettes, Cigars    Last attempt to quit: 10/18/1979    Years since quitting: 37.6  . Smokeless tobacco: Never Used  Substance Use Topics  . Alcohol use: No  . Drug use: No    Review of Systems Patient denies headaches, rhinorrhea, blurry vision, numbness, shortness of breath, chest pain, edema, cough, abdominal pain, nausea, vomiting, diarrhea, dysuria, fevers, rashes or hallucinations unless otherwise stated above in HPI. ____________________________________________   PHYSICAL EXAM:  VITAL SIGNS: Vitals:   06/01/17 1803  BP: 130/64  Pulse: 73  Resp: 18  Temp: 97.9 F (36.6 C)  SpO2: 95%    Constitutional: Alert, chronically ill but in no acute distress. Eyes: Conjunctivae are normal.  Head: Atraumatic. Nose: No congestion/rhinnorhea. Mouth/Throat:  Mucous membranes are moist.   Neck: No stridor. Painless ROM.  Cardiovascular: Normal rate, regular rhythm. Grossly normal heart sounds.  Good peripheral circulation. Respiratory: Normal respiratory effort.  No retractions. Lungs CTAB. Gastrointestinal: Soft and nontender. No distention. No abdominal bruits. No CVA tenderness. Genitourinary: deferred Musculoskeletal: Bilateral lower extremity swelling and evidence of venous stasis without ulceration.  Does have persistence of nail avulsions that appear to be well healing without purulence, drainage or crepitus.  He has biphasic PT and DP Doppler signals in bilateral feet with brisk cap refill.  No obvious deformity.  Does have pain with flexion of right knee but no effusion warmth or deformity.. Neurologic:  Normal speech and language. No gross focal neurologic deficits are appreciated. No facial droop Skin:  Skin is warm, dry and intact. No rash noted.  ____________________________________________   LABS (all labs ordered are listed, but only abnormal results are displayed)  No results found for this or any previous visit (from the past 24 hour(s)). ____________________________________________ ____________________________________________  RADIOLOGY  I personally reviewed all radiographic images ordered to evaluate for the above acute complaints and reviewed radiology reports and findings.  These findings were personally discussed with the patient.  Please see medical record for radiology report.  ____________________________________________   PROCEDURES  Procedure(s) performed:  Procedures    Critical Care performed: no ____________________________________________   INITIAL IMPRESSION / ASSESSMENT AND PLAN / ED COURSE  Pertinent labs & imaging results that were available during my care of the patient were reviewed by me and considered in my medical decision making (see chart for details).  DDX: edema, venous stasis,  cellulitis, fracture, claudication  MAKAIL WATLING is a 83 y.o. who presents to the ED with symptoms as described above.  Not clinically consistent with ischemic limb scans good peripheral perfusion good Doppler signals would suggest against any component of claudication.  No evidence of infectious process given lack of fever.  Not considerably more swollen than previous and patient states that he pulled off the compression stockings because he did not like wearing them despite me explaining that the compression stockings are also helping with his chronic edema.  To suspect some component of persistent neuropathy.  I actually spoke with the physical therapist that is scheduled to see him tomorrow morning on the phone.  And states that she has been visiting him frequently over the past 2 weeks and  has no concerns with his home safety.  States that he refuses to use a walker.  Social work has been out to his house to evaluate for placement the patient states that he did not need any additional help and did not want to be placed.  This time I asked patient if he wanted to be placed states he just wants something to help with the pain.  We will give him additional dose of his Neurontin as well as some Ultram.  Physical therapist states that she does plan to see him tomorrow morning at 8:00 and is not had any additional concerns about his mobility or lower extremity swelling over the past few days.  This point I do not identify any need for additional diagnostic testing.  Have also ordered an x-ray of the hip to evaluate for any evidence of referred pain to his right knee that could be from possibly a malignant or lytic lesion.  Clinical Course as of Jun 01 2045  Wed Jun 01, 2017  2003 No x-ray evidence of fracture.  Also spoke with the patient's daughter as I was unable to contact the patient's grandson as he is frequently at work.  She does not report any additional concerns and states that she has noted that  he has been having chronic issues with his legs, neuropathy and swelling for some time now.   Do believe this is most clinically reflective of chronic leg pain and the patient does have appropriate outpatient follow-up.   [PR]  2021 Patient refusing to be discharged home despite having access to physical therapy tomorrow morning stating that physical therapy "does not do him any good because they make him do too many exercises ".  Patient becoming agitated when discussing discharge home.  Will be observed in the ER overnight for social work and case management   [PR]  2021  consultation with physical therapy and possible placement but anticipate discharge back home.   [PR]    Clinical Course User Index [PR] Willy Eddy, MD     As part of my medical decision making, I reviewed the following data within the electronic MEDICAL RECORD NUMBER Nursing notes reviewed and incorporated, Labs reviewed, notes from prior ED visits and [place] Controlled Substance Database   ____________________________________________   FINAL CLINICAL IMPRESSION(S) / ED DIAGNOSES  Final diagnoses:  Chronic pain of both lower extremities      NEW MEDICATIONS STARTED DURING THIS VISIT:  New Prescriptions   No medications on file     Note:  This document was prepared using Dragon voice recognition software and may include unintentional dictation errors.      Willy Eddy, MD 06/01/17 2048

## 2017-06-01 NOTE — ED Triage Notes (Signed)
Pt arrived via [facility] from home. Pt complains of leg pain. Swelling noted to right leg.

## 2017-06-02 ENCOUNTER — Encounter: Payer: Self-pay | Admitting: Rheumatology

## 2017-06-02 ENCOUNTER — Encounter: Payer: Self-pay | Admitting: Internal Medicine

## 2017-06-02 DIAGNOSIS — M25561 Pain in right knee: Secondary | ICD-10-CM | POA: Diagnosis present

## 2017-06-02 LAB — BASIC METABOLIC PANEL
ANION GAP: 5 (ref 5–15)
BUN: 15 mg/dL (ref 6–20)
CALCIUM: 8.6 mg/dL — AB (ref 8.9–10.3)
CO2: 23 mmol/L (ref 22–32)
Chloride: 108 mmol/L (ref 101–111)
Creatinine, Ser: 1.1 mg/dL (ref 0.61–1.24)
Glucose, Bld: 124 mg/dL — ABNORMAL HIGH (ref 65–99)
POTASSIUM: 3.3 mmol/L — AB (ref 3.5–5.1)
SODIUM: 136 mmol/L (ref 135–145)

## 2017-06-02 LAB — C-REACTIVE PROTEIN: CRP: 1.3 mg/dL — ABNORMAL HIGH (ref <1.0)

## 2017-06-02 LAB — SEDIMENTATION RATE: Sed Rate: 52 mm/hr — ABNORMAL HIGH (ref 0–20)

## 2017-06-02 LAB — CBC
HEMATOCRIT: 36.8 % — AB (ref 40.0–52.0)
HEMOGLOBIN: 12.9 g/dL — AB (ref 13.0–18.0)
MCH: 33.5 pg (ref 26.0–34.0)
MCHC: 35.1 g/dL (ref 32.0–36.0)
MCV: 95.3 fL (ref 80.0–100.0)
Platelets: 219 10*3/uL (ref 150–440)
RBC: 3.86 MIL/uL — AB (ref 4.40–5.90)
RDW: 14.3 % (ref 11.5–14.5)
WBC: 6.2 10*3/uL (ref 3.8–10.6)

## 2017-06-02 LAB — URIC ACID: URIC ACID, SERUM: 9.8 mg/dL — AB (ref 4.4–7.6)

## 2017-06-02 MED ORDER — CEPHALEXIN 500 MG PO CAPS
500.0000 mg | ORAL_CAPSULE | Freq: Three times a day (TID) | ORAL | Status: DC
  Administered 2017-06-02 – 2017-06-04 (×7): 500 mg via ORAL
  Filled 2017-06-02 (×7): qty 1

## 2017-06-02 MED ORDER — ATORVASTATIN CALCIUM 20 MG PO TABS
40.0000 mg | ORAL_TABLET | Freq: Every morning | ORAL | Status: DC
  Administered 2017-06-03 – 2017-06-04 (×2): 40 mg via ORAL
  Filled 2017-06-02 (×2): qty 2

## 2017-06-02 MED ORDER — POTASSIUM CHLORIDE CRYS ER 10 MEQ PO TBCR
10.0000 meq | EXTENDED_RELEASE_TABLET | Freq: Every day | ORAL | Status: DC
  Administered 2017-06-03 – 2017-06-04 (×2): 10 meq via ORAL
  Filled 2017-06-02 (×3): qty 1

## 2017-06-02 MED ORDER — FUROSEMIDE 20 MG PO TABS
20.0000 mg | ORAL_TABLET | Freq: Every day | ORAL | Status: DC
  Administered 2017-06-03 – 2017-06-04 (×2): 20 mg via ORAL
  Filled 2017-06-02 (×2): qty 1

## 2017-06-02 MED ORDER — PREDNISONE 20 MG PO TABS
40.0000 mg | ORAL_TABLET | Freq: Every day | ORAL | Status: DC
  Administered 2017-06-03 – 2017-06-04 (×2): 40 mg via ORAL
  Filled 2017-06-02 (×2): qty 2

## 2017-06-02 MED ORDER — TRAMADOL HCL 50 MG PO TABS
50.0000 mg | ORAL_TABLET | Freq: Four times a day (QID) | ORAL | Status: DC | PRN
  Administered 2017-06-03: 50 mg via ORAL
  Filled 2017-06-02: qty 1

## 2017-06-02 MED ORDER — ONDANSETRON HCL 4 MG PO TABS: 4.0000 mg | ORAL_TABLET | Freq: Four times a day (QID) | ORAL | Status: DC | PRN

## 2017-06-02 MED ORDER — ONDANSETRON HCL 4 MG/2ML IJ SOLN: 4.0000 mg | Freq: Four times a day (QID) | INTRAMUSCULAR | Status: DC | PRN

## 2017-06-02 MED ORDER — METHYLPREDNISOLONE SODIUM SUCC 125 MG IJ SOLR
125.0000 mg | Freq: Once | INTRAMUSCULAR | Status: AC
Start: 2017-06-02 — End: 2017-06-02
  Administered 2017-06-02: 125 mg via INTRAVENOUS
  Filled 2017-06-02: qty 2

## 2017-06-02 MED ORDER — BISOPROLOL FUMARATE 5 MG PO TABS
5.0000 mg | ORAL_TABLET | Freq: Every day | ORAL | Status: DC
  Administered 2017-06-02 – 2017-06-04 (×3): 5 mg via ORAL
  Filled 2017-06-02 (×4): qty 1

## 2017-06-02 NOTE — ED Notes (Signed)
Up to BR with walker. Unable to bear much weight on Right leg.

## 2017-06-02 NOTE — H&P (Signed)
[facility] NAME: [name]    MR#:  [id number]  DATE OF BIRTH:  31-Jan-1935  DATE OF ADMISSION:  06/01/2017  PRIMARY CARE PHYSICIAN: Olin Hauser, DO   REQUESTING/REFERRING PHYSICIAN: Dr Marjean Donna  CHIEF COMPLAINT:   Chief Complaint  Patient presents with  . Leg Pain    HISTORY OF PRESENT ILLNESS:  [name]  is a 83 y.o. male with a known history of CHF, DVT, arthritis, psoriasis.  He states over the past few weeks that he has been having joint pains.  He started developing swelling and tingling and itching in his hands.  His right shoulder hurts.  His right knee hurts and he cannot bend it.  His ankles hurt.  Bottoms of his feet have been painful.  In the ER, he was tried on some pain medications and watched for 16 hours and they were trying to get him home.  He cannot even walk or bend his right knee and then hospitalist services were contacted for further evaluation.  ER physician ordered for some IV steroid today.  Difficult history with the patient because he keeps on repeating himself.  PAST MEDICAL HISTORY:   Past Medical History:  Diagnosis Date  . Arthritis   . CAD (coronary artery disease)   . Congestive heart failure (CHF) ([place]) 06/14/2016   LVEF 40-45% 2016 echo; Dr. Saralyn Pilar  . COPD (chronic obstructive pulmonary disease) ([place])   . Dementia   . Depression   . Diabetes mellitus without complication ([place])   . Family history of adverse reaction to anesthesia    mom seizures   . GERD (gastroesophageal reflux disease)   . Heart disease   . History of blood clots in legs   . History of ischemic cardiomyopathy   . 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 Hyperlipidemia   . Hypertension   . Literacy level of illiterate   . Osteomyelitis ([place])   . PN (peripheral neuropathy)   . Psoriasis     PAST SURGICAL HISTORY:   Past Surgical History:  Procedure Laterality Date  . amputation  4th finger Left   . APPENDECTOMY    . CLAVICLE SURGERY  2012   open reduction and internal fixation of left clavicle  . CORONARY ARTERY BYPASS GRAFT  2002  . FOOT SURGERY    . IMPLANTABLE CARDIOVERTER DEFIBRILLATOR (ICD) GENERATOR CHANGE N/A 12/03/2015   Procedure: ICD GENERATOR CHANGE;  Surgeon: Isaias Cowman, MD;  Location: [facility] ORS;  Service: Cardiovascular;  Laterality: N/A;  . PACEMAKER INSERTION      SOCIAL HISTORY:   Social History   Tobacco Use  . Smoking status: Former Smoker    Packs/day: 0.25    Years: 10.00    Pack years: 2.50    Types: Cigarettes, Cigars    Last attempt to quit: 10/18/1979    Years since quitting: 37.6  . Smokeless tobacco: Never Used  Substance Use Topics  . Alcohol use: No    FAMILY HISTORY:   Family History  Problem Relation Age of Onset  . Cancer Mother        spine  . Hypertension Mother   . Mental illness Mother   . Cancer Father        lung  . Asthma Father   . Allergic rhinitis Father   . Arthritis Father   . Tuberculosis Father   . Heart disease Father   . Gout Brother   . Allergic rhinitis Brother  DRUG ALLERGIES:   Allergies  Allergen Reactions  . Oxycodone-Acetaminophen Other (See Comments)    Causes the pt to be very irritable, says he can take this  . Percocet [Oxycodone-Acetaminophen] Other (See Comments)    Reaction:  Irritability     REVIEW OF SYSTEMS:  CONSTITUTIONAL: Positive for fever, and chills.  Positive for fatigue and weakness.  EYES: No blurred or double vision.  EARS, NOSE, AND THROAT: No tinnitus or ear pain. No sore throat RESPIRATORY: No cough.  Positive for shortness of breath.  No wheezing or hemoptysis.  CARDIOVASCULAR: No chest pain, orthopnea, edema.  GASTROINTESTINAL: No nausea, vomiting, diarrhea or abdominal pain. No blood in bowel movements GENITOURINARY: No dysuria, hematuria.  ENDOCRINE: No polyuria, nocturia,  HEMATOLOGY: No anemia, easy bruising or bleeding SKIN: No rash  or lesion. MUSCULOSKELETAL: Positive for joint pain and swelling all over. NEUROLOGIC: No tingling, numbness in hands.  PSYCHIATRY: No anxiety or depression.   MEDICATIONS AT HOME:   Prior to Admission medications   Medication Sig Start Date End Date Taking? Authorizing Provider  acetaminophen (TYLENOL) 500 MG tablet Take 500 mg by mouth every 8 (eight) hours as needed for mild pain or fever.    Yes [provider]  apixaban (ELIQUIS) 5 MG TABS tablet Take 10 mg twice a day for 7 days and then '5mg'$  twice a day until your doctor tells you to stop. 05/24/17  Yes Merlyn Lot, MD  aspirin EC 81 MG tablet Take 1 tablet (81 mg total) by mouth daily. 05/11/16  Yes [name], Satira Anis, MD  atorvastatin (LIPITOR) 40 MG tablet Take 1 tablet (40 mg total) by mouth at bedtime. 12/17/16  Yes [name], Satira Anis, MD  Elastic Bandages & Supports (TRUFORM STOCKINGS 20-30MMHG) MISC Wear on both legs; put on first thing in the morning and remove before bed; do NOT sleep in them 03/24/17  Yes [name], Satira Anis, MD  Elastic Bandages & Supports (WRIST BRACE/RIGHT LARGE) MISC Wear on the right wrist, cock-up wrist splint for carpal tunnel 03/24/17  Yes [name], Satira Anis, MD  furosemide (LASIX) 20 MG tablet Take 1 tablet (20 mg total) by mouth daily. 04/28/17  Yes Karamalegos, Devonne Doughty, DO  gabapentin (NEURONTIN) 300 MG capsule Take 2 capsules (600 mg total) by mouth 3 (three) times daily. Take 1 pill AM, Take 1 pill Mid-day, Take 2 pills PM x 3 days Take 1 pill AM, Take 2 pills Mid-day, Take 2 pills PM x 3 days Take 2 pills AM, Take 2 pills Mid-day, Take 2 pills PM 05/11/17 06/10/17 Yes Menshew, Dannielle Karvonen, PA-C  ibuprofen (ADVIL,MOTRIN) 200 MG tablet Take 200 mg by mouth every 6 (six) hours as needed for mild pain. Pt takes twice a day, am/pm    Yes [provider]  pantoprazole (PROTONIX) 40 MG tablet Take 1 tablet (40 mg total) by mouth daily. 04/28/17  Yes Karamalegos, Devonne Doughty, DO  potassium chloride  (K-DUR) 10 MEQ tablet Take 1 tablet (10 mEq total) by mouth daily. 04/28/17  Yes Karamalegos, Devonne Doughty, DO      VITAL SIGNS:  Blood pressure (!) 142/66, pulse 81, temperature 97.9 F (36.6 C), temperature source Oral, resp. rate 20, height '5\' 10"'$  (1.778 m), weight 91.6 kg (202 lb), SpO2 96 %.  PHYSICAL EXAMINATION:  GENERAL:  83 y.o.-year-old patient lying in the bed with no acute distress.  EYES: Pupils equal, round, reactive to light and accommodation. No scleral icterus. Extraocular muscles intact.  HEENT: Head atraumatic, normocephalic.  Oropharynx and nasopharynx clear.  NECK:  Supple, no jugular venous distention. No thyroid enlargement, no tenderness.  LUNGS:  decreased breath sounds bilaterally, no wheezing, rales,rhonchi or crepitation. No use of accessory muscles of respiration.  CARDIOVASCULAR: S1, S2 normal. No murmurs, rubs, or gallops.  ABDOMEN: Soft, nontender, nondistended. Bowel sounds present. No organomegaly or mass.  Large ventral hernia nonpainful. EXTREMITIES: 3+ pedal edema, no cyanosis, or clubbing.  Positive joint deformities bilateral hands.  Right knee swollen, looks like a joint effusion and patient unable to bend the right knee at all.  Refuses Me to touch his knee. NEUROLOGIC: Cranial nerves II through XII are intact. Muscle strength 5/5 in all extremities. Sensation intact. Gait not checked.  PSYCHIATRIC: The patient is alert and oriented x 3.  SKIN: No rash, lesion, or ulcer.   LABORATORY PANEL:   Laboratory data from 05/24/2017 reviewed by me. Labs for today ordered.  RADIOLOGY:  Dg Knee 2 Views Right  Result Date: 06/01/2017 CLINICAL DATA:  Pt reports that he fell on his right side approx a month ago, but stated that he did not have any recent injury. Pt says that he was sitting in his chair today when his right hip and knee randomly started hurting. Pt leg appears .*comment was truncated* EXAM: RIGHT KNEE - 1-2 VIEW COMPARISON:  None. FINDINGS: No  fracture of the proximal tibia or distal femur. Patella is normal. No joint effusion. IMPRESSION: No fracture or dislocation. Electronically Signed   By: Suzy Bouchard M.D.   On: 06/01/2017 19:54   Dg Chest Portable 1 View  Result Date: 06/01/2017 CLINICAL DATA:  Fall.  Possible hip fracture EXAM: PORTABLE CHEST 1 VIEW COMPARISON:  04/29/2017 FINDINGS: Cardiac enlargement without heart failure. AICD and prior CABG. Lungs clear without infiltrate or effusion. IMPRESSION: No active disease. Electronically Signed   By: Franchot Gallo M.D.   On: 06/01/2017 19:58   Dg Hip Unilat With Pelvis 2-3 Views Right  Result Date: 06/01/2017 CLINICAL DATA:  Pt reports that he fell on his right side approx a month ago, but stated that he did not have any recent injury. Pt says that he was sitting in his chair today when his right hip and knee randomly started hurting. Pt leg appears .*comment was truncated* EXAM: DG HIP (WITH OR WITHOUT PELVIS) 2-3V RIGHT COMPARISON:  None. FINDINGS: Hips are located. No evidence of pelvic fracture or sacral fracture. Dedicated view of the RIGHT hip demonstrates no femoral neck fracture. IMPRESSION: No significant arthropathy or trauma the pelvis or RIGHT hip. Electronically Signed   By: Suzy Bouchard M.D.   On: 06/01/2017 19:56     IMPRESSION AND PLAN:   1.  Inflammatory arthritis.  Case discussed with rheumatology Dr. Jefm Bryant to see the patient in consultation.  Dose of Solu-Medrol ordered for today and I will continue prednisone 40 mg daily.  Right knee seems to be the worst with joint effusion and slight redness and unable to bend at the right knee.  ESR, CRP, ANA with reflex if positive, rheumatoid factor, uric acid, BmP, LFTs and CBC ordered. 2.  History of  systolic congestive heart failure.  On low-dose Lasix.  Add low-dose bisoprolol.  likely will have to add low-dose ACE inhibitor soon. 3.  History of DVT on Eliquis 4.  History of dementia and depression 5.   Hyperlipidemia unspecified on atorvastatin 6.  GERD on Protonix 7.  Neuropathy on gabapentin 8.  Essential hypertension.  Add bisoprolol  All the records are  reviewed and case discussed with ED provider. Management plans discussed with the patient, and he is in agreement.  CODE STATUS: Full code  TOTAL TIME TAKING CARE OF THIS PATIENT: 55 minutes.    Loletha Grayer M.D on 06/02/2017 at 10:47 AM  Between 7am to 6pm - Pager - [phone]  After 6pm call admission pager [phone]  [facility]  [phone]  CC: Primary care physician; Olin Hauser, DO

## 2017-06-02 NOTE — Evaluation (Signed)
Physical Therapy Evaluation Patient Details Name: [name] MRN: [id number] DOB: May 16, 1934 Today's Date: 06/02/2017   History of Present Illness  Pt is an 83 y.o. male with a known history of CHF, DVT, arthritis, psoriasis.  He states over the past few weeks that he has been having joint pains.  He started developing swelling and tingling and itching in his hands.  His right shoulder hurts.  His right knee hurts and he cannot bend it.  His ankles hurt.  Bottoms of his feet have been painful.  In the ER, he was tried on some pain medications and watched for 16 hours and they were trying to get him home.  He cannot even walk or bend his right knee and then hospitalist services were contacted for further evaluation.  ER physician ordered for some IV steroid today.  Assessment includes: Inflammatory arthritis, dementia, HLD, GERD, neuropathy, and essential HTN.    Clinical Impression  Pt presents with deficits in strength, transfers, mobility, gait, balance, R knee ROM, and activity tolerance.  Pt required extensive time and effort with sup to/from sit and was determined to accomplish tasks independently but eventually accepted assistance for the RLE in and out of bed.  In sitting at EOB pt was only able to flex the R knee to grossly 45 deg after performing seated therex for ROM.  Pt made multiple attempts to stand from the EOB but was unable.  Pt presents with a significant decline in function from his stated PLOF and would not be safe to discharge home at his current functional level.  Pt will benefit from PT services in a SNF setting upon discharge to safely address above deficits for decreased caregiver assistance and eventual return to PLOF.      Follow Up Recommendations SNF;Supervision for mobility/OOB    Equipment Recommendations  Other (comment)(TBD at next venue of care)    Recommendations for Other Services       Precautions / Restrictions Precautions Precautions:  Fall Restrictions Weight Bearing Restrictions: No      Mobility  Bed Mobility Overal bed mobility: Needs Assistance Bed Mobility: Supine to Sit;Sit to Supine     Supine to sit: Min assist Sit to supine: Min assist   General bed mobility comments: Min A for RLE in and out of bed  Transfers                 General transfer comment: Pt unable to stand from elevated EOB  Ambulation/Gait             General Gait Details: Unable  Stairs            Wheelchair Mobility    Modified Rankin (Stroke Patients Only)       Balance Overall balance assessment: (Normal sitting balance, unable to assess standing balance)                                           Pertinent Vitals/Pain Pain Assessment: 0-10 Pain Score: 5  Pain Location: General joint pain in the hands and BLEs with the R knee being the most severe Pain Descriptors / Indicators: Aching;Shooting Pain Intervention(s): Premedicated before session;Monitored during session;Limited activity within patient's tolerance    Home Living Family/patient expects to be discharged to:: Private residence Living Arrangements: Other relatives(Grandson) Available Help at Discharge: Family;Available PRN/intermittently(Grandson works 2 jobs) Type of Home: [employer]  Access: Level entry     Home Layout: One level Home Equipment: Walker - standard      Prior Function Level of Independence: Independent         Comments: Ind amb without AD, Ind with ADLs     Hand Dominance   Dominant Hand: Right    Extremity/Trunk Assessment   Upper Extremity Assessment Upper Extremity Assessment: Overall WFL for tasks assessed    Lower Extremity Assessment Lower Extremity Assessment: Generalized weakness;RLE deficits/detail RLE: Unable to fully assess due to pain       Communication   Communication: No difficulties  Cognition Arousal/Alertness: Awake/alert Behavior During Therapy: WFL for  tasks assessed/performed Overall Cognitive Status: No family/caregiver present to determine baseline cognitive functioning                                        General Comments      Exercises Total Joint Exercises Ankle Circles/Pumps: AROM;Both;10 reps Quad Sets: Strengthening;Both;5 reps Hip ABduction/ADduction: AAROM;Right;5 reps Straight Leg Raises: AAROM;Right;5 reps Long Arc Quad: AROM;Both;5 reps;10 reps(Gentle with limited knee flex on the RLE secondary to pain) Knee Flexion: AROM;Both;5 reps;10 reps(Gentle with limited knee flex on the RLE secondary to pain) Other Exercises Other Exercises: HEP education for BLE APs and QS x 10 5-6x/day   Assessment/Plan    PT Assessment Patient needs continued PT services  PT Problem List Decreased strength;Decreased range of motion;Decreased activity tolerance;Decreased mobility;Decreased knowledge of use of DME       PT Treatment Interventions DME instruction;Gait training;Functional mobility training;Balance training;Therapeutic exercise;Therapeutic activities;Patient/family education    PT Goals (Current goals can be found in the Care Plan section)  Acute Rehab PT Goals Patient Stated Goal: To walk without pain PT Goal Formulation: With patient Time For Goal Achievement: 06/15/17 Potential to Achieve Goals: Fair    Frequency Min 2X/week   Barriers to discharge Inaccessible home environment;Decreased caregiver support      Co-evaluation               AM-PAC PT "6 Clicks" Daily Activity  Outcome Measure Difficulty turning over in bed (including adjusting bedclothes, sheets and blankets)?: Unable Difficulty moving from lying on back to sitting on the side of the bed? : Unable Difficulty sitting down on and standing up from a chair with arms (e.g., wheelchair, bedside commode, etc,.)?: Unable Help needed moving to and from a bed to chair (including a wheelchair)?: Total Help needed walking in hospital  room?: Total Help needed climbing 3-5 steps with a railing? : Total 6 Click Score: 6    End of Session Equipment Utilized During Treatment: Gait belt Activity Tolerance: Patient limited by pain Patient left: in bed;with bed alarm set;with call bell/phone within reach Nurse Communication: Mobility status PT Visit Diagnosis: Muscle weakness (generalized) (M62.81);Difficulty in walking, not elsewhere classified (R26.2);Pain Pain - Right/Left: Right Pain - part of body: Knee    Time: 1545-1620 PT Time Calculation (min) (ACUTE ONLY): 35 min   Charges:   PT Evaluation $PT Eval Low Complexity: 1 Low PT Treatments $Therapeutic Exercise: 8-22 mins   PT G Codes:        DElly Modena PT, DPT 06/02/17, 4:40 PM

## 2017-06-02 NOTE — Care Management (Signed)
Conf[facility]

## 2017-06-02 NOTE — ED Notes (Signed)
Hospitalist in to see patient at this time.

## 2017-06-02 NOTE — ED Notes (Signed)
Pt called out asking for blankets, more water and coffee at this time. No other concerns or needs voiced at this time.

## 2017-06-02 NOTE — ED Notes (Signed)
Pt's meal to room. Pt in NAD and eating.

## 2017-06-02 NOTE — Care Management Note (Addendum)
Case Management Note  Patient Details  Name: [name] MRN: [id number] Date of Birth: 1935-02-03  Subjective/Objective:                  RNCM consult to this patient while in ED. Ultimately what I see as a RN during my assessment is a very dissatisfied/possibly afraid patient in pain and a right knee significantly swollen stating he cannot walk.  He is complaining and intermittently grimacing during my assessment.  He is very unhappy with the care he has been given but ultimately that "he has not been fixed".  He is very difficult to talk to as he is angry and saying words like "y'all are stupid and I'm human".  I also see that on 04/12/17 patient made multiple attempts to get into [facility] for primary care without success. After a long discussion with patient I did hear that patient has fired multiple providers as he does not feel his needs are being met.  He included that his medications are sometimes delayed related to "doctor's not doing their job".  I explained to patient that sometimes kindness goes a long way and suggested that he try to slowly describe his concerns so we do not miss anything he is trying to explain. He (already agitated) said "y'all just stupid and not hearing me- I am human".  I spoke with Dr. Ola Spurr office. They are needing his insurance information and I will fax to [employer] PA at [facility] [phone]. Patient states his grandson lives with him. He states he has of his medications now. He says that he has received PT at home over the last 3 days and now can't walk. He says he has never been seen by a Rheumatologist.  He could not tell me who his PCP was that called in his medications last. He had an appointment with Ned Grace PA on 04/18/17 at 1:30PM but was a no-show.  I made another appointment for patient with Corene Cornea PA on April 29 at 3:30PM.  I explained that patient will take significant time however appointments are usually 30 minutes with PCP. I  have updated patient of appointment and the expecting of 30 minutes appointment. PT eval. I spoke with ED provider and suggested that patient at least come into the hospital under OBS status for PT and pain management. Patient said he "had to crawl to the bathroom at home and cannot return to home like this".  He has been to SNF in the past "[address] and I will not go back to that place". He uses [facility] for home delivery.   Action/Plan:  Apparently has HHPT in place at home.  I have left message for [name] (son) [phone] and [phone].   Expected Discharge Date:                  Expected Discharge Plan:     In-House Referral:  Clinical Social Work  Discharge planning Services  CM Consult  Post Acute Care Choice:  Home Health Choice offered to:  Patient, Adult Children  DME Arranged:    DME Agency:     HH Arranged:    [place] Agency:     Status of Service:  In process, will continue to follow  If discussed at Long Length of Stay Meetings, dates discussed:    Additional Comments:  Marshell Garfinkel, RN 06/02/2017, 10:03 AM

## 2017-06-02 NOTE — ED Notes (Signed)
Pt called out for water at this time, I brought him a cup and pt is asking for a picture of water. I explained we do not have those down here. Pt appears to be comfortable and is watching TV.

## 2017-06-02 NOTE — H&P (Signed)
Reason for Consult: Right knee pain.  Joint pain  Referring Physician: Dr. Fonnie BirkenheadWhiting.  Hospitalist  [name]   HPI: Poor historian.  Male.  History of diabetes coronary disease.  Recent DVT.  Own anticoagulation.  Cardiomyopathy with ejection fraction 40%. Prior traumatic cone toe amputation.  A couple weeks ago he had some swelling in the hand.  Points mainly to the third finger.  At that time he had pain in the shoulder He awakened 2 mornings ago with pain in the right leg.  Points mainly to the knee.  Said he had to crawl around his house to get around because he had so much leg pain.  Was evaluated in the emergency room.  Some pain in the knee.  Admitted observation. Uric acid elevated at 9.8.  White count normal at 6.2 On direct questioning he said he has had some fever. X-ray of the knee was unremarkable.  Foot film showed significant change the first MTP. No prior history of podagra it has been documented but he is wondering if he has gout.  No significant alcohol.  Used to be a [employer] architect[employer]ruck driver  PMH: Atherosclerosis.  Cardiomyopathy.  Diabetes.  Peripheral neuropathy.  DVT.  SURGICAL HISTORY: Defibrillator.  Foot procedure.  Family History: Negative for gout or connective tissue disease  Social History: Denies significant alcohol  Allergies:  Allergies  Allergen Reactions  . Oxycodone-Acetaminophen Other (See Comments)    Causes the pt to be very irritable, says he can take this  . Percocet [Oxycodone-Acetaminophen] Other (See Comments)    Reaction:  Irritability     Medications: Scheduled:      ROS: Burning in the feet.  Long-standing.  Feels like they are on fire.   PHYSICAL EXAM: There were no vitals taken for this visit. Afebrile.  Sclera clear.  Brawny edema in the lower extremities. Musculoskeletal: Good range of motion neck and shoulders a mildly painful arc on the right.  No elbow tophi.  Hands with mild tenderness across the PIPs trigger nodule left  third.  DIP left second chin bony change.  Possible prior injury.  No significant wrist MCP synovitis.  Hips move well.  Right knee has erythema and swelling over the prepatellar space.  Tender to touch and to flex the knee.  No definite right knee effusion.  Left knee without effusion.  Good range of motion of his ankles.  MTPs nontender with metatarsal squeeze  Assessment: Prepatellar bursitis.  May be crystalline given his hyperuricemia and his risk factors for gout.  Some concern for prepatellar septic bursitis as he has been on his knees.  No significant fever or elevated white count Osteoarthritis Cannot rule out recent inflammatory arthritis in the hand which could have been gouty.  Not apparent currently Cardiomyopathy Peripheral vascular disease DVT on Eliquis  Recommendations: Agree with steroid.  May taper as he improves  Might consider antibiotic as well.  I drew the borders for his erythema of the knee today which can be followed.  Not enough evidence to aspirate the knee joint as the knee joint does not appear to be swollen.   May use heating pad and analgesics as the prepatellar bursitis is usually painful.  If he were thought to be compliant ,consider adding allopurinol 100 mg daily as an outpatient when his knee has calmed down.  Can be titrated to get his uric acid less than 6.0  [name], [name] 06/02/2017, 1:11 PM

## 2017-06-02 NOTE — Consult Note (Signed)
Reason for Consult: Right knee pain.  Joint pain  Referring Physician: Dr. Fonnie BirkenheadWhiting.  Hospitalist  [name]   HPI: Poor historian.  Male.  History of diabetes coronary disease.  Recent DVT.  Own anticoagulation.  Cardiomyopathy with ejection fraction 40%. Prior traumatic cone toe amputation.  A couple weeks ago he had some swelling in the hand.  Points mainly to the third finger.  At that time he had pain in the shoulder He awakened 2 mornings ago with pain in the right leg.  Points mainly to the knee.  Said he had to crawl around his house to get around because he had so much leg pain.  Was evaluated in the emergency room.  Some pain in the knee.  Admitted observation. Uric acid elevated at 9.8.  White count normal at 6.2 On direct questioning he said he has had some fever. X-ray of the knee was unremarkable.  Foot film showed significant change the first MTP. No prior history of podagra it has been documented but he is wondering if he has gout.  No significant alcohol.  Used to be a [employer] architecttruck driver  PMH: Atherosclerosis.  Cardiomyopathy.  Diabetes.  Peripheral neuropathy.  DVT.  SURGICAL HISTORY: Defibrillator.  Foot procedure.  Family History: Negative for gout or connective tissue disease  Social History: Denies significant alcohol  Allergies:       Allergies  Allergen Reactions  . Oxycodone-Acetaminophen Other (See Comments)    Causes the pt to be very irritable, says he can take this  . Percocet [Oxycodone-Acetaminophen] Other (See Comments)    Reaction:  Irritability     Medications: Scheduled:      ROS: Burning in the feet.  Long-standing.  Feels like they are on fire.   PHYSICAL EXAM: There were no vitals taken for this visit. Afebrile.  Sclera clear.  Brawny edema in the lower extremities. Musculoskeletal: Good range of motion neck and shoulders a mildly painful arc on the right.  No elbow tophi.  Hands with mild tenderness across the  PIPs trigger nodule left third.  DIP left second chin bony change.  Possible prior injury.  No significant wrist MCP synovitis.  Hips move well.  Right knee has erythema and swelling over the prepatellar space.  Tender to touch and to flex the knee.  No definite right knee effusion.  Left knee without effusion.  Good range of motion of his ankles.  MTPs nontender with metatarsal squeeze  Assessment: Prepatellar bursitis.  May be crystalline given his hyperuricemia and his risk factors for gout.  Some concern for prepatellar septic bursitis as he has been on his knees.  No significant fever or elevated white count Osteoarthritis Cannot rule out recent inflammatory arthritis in the hand which could have been gouty.  Not apparent currently Cardiomyopathy Peripheral vascular disease DVT on Eliquis  Recommendations: Agree with steroid.  May taper as he improves  Might consider antibiotic as well.  I drew the borders for his erythema of the knee today which can be followed.  Not enough evidence to aspirate the knee joint as the knee joint does not appear to be swollen.   May use heating pad and analgesics as the prepatellar bursitis is usually painful.  If he were thought to be compliant ,consider adding allopurinol 100 mg daily as an outpatient when his knee has calmed down.  Can be titrated to get his uric acid less than 6.0  [name], [name] 06/02/2017, 1:11 PM  Electronically signed by Kandyce Rud., MD at 06/02/2017 1:21 PM

## 2017-06-02 NOTE — ED Notes (Signed)
Pts breakfast and coffee was given, also given a plastic container that holds water for pt. No other needs or concerns needed at this time.

## 2017-06-02 NOTE — Care Management (Signed)
Per documentation from [facility]CP [facility]

## 2017-06-03 LAB — ENA+DNA/DS+ANTICH+CENTRO+JO...
Chromatin Ab SerPl-aCnc: 0.2 AI (ref 0.0–0.9)
DS DNA AB: 1 [IU]/mL (ref 0–9)
ENA SM Ab Ser-aCnc: <0.2 AI (ref 0.0–0.9)
Ribonucleic Protein: <0.2 AI (ref 0.0–0.9)
SSA (RO) (ENA) ANTIBODY, IGG: 0.3 AI (ref 0.0–0.9)
SSB (La) (ENA) Antibody, IgG: <0.2 AI (ref 0.0–0.9)
Scleroderma (Scl-70) (ENA) Antibody, IgG: <0.2 AI (ref 0.0–0.9)

## 2017-06-03 LAB — HEPATIC FUNCTION PANEL
ALT: 12 U/L — ABNORMAL LOW (ref 17–63)
AST: 31 U/L (ref 15–41)
Albumin: 3.4 g/dL — ABNORMAL LOW (ref 3.5–5.0)
Alkaline Phosphatase: 87 U/L (ref 38–126)
BILIRUBIN TOTAL: 0.4 mg/dL (ref 0.3–1.2)
Total Protein: 7.5 g/dL (ref 6.5–8.1)

## 2017-06-03 LAB — RHEUMATOID FACTOR: Rhuematoid fact SerPl-aCnc: <10 IU/mL (ref 0.0–13.9)

## 2017-06-03 LAB — ANA W/REFLEX IF POSITIVE: ANA: POSITIVE — AB

## 2017-06-03 MED ORDER — ALLOPURINOL 100 MG PO TABS
100.0000 mg | ORAL_TABLET | Freq: Every day | ORAL | Status: DC
  Administered 2017-06-03 – 2017-06-04 (×2): 100 mg via ORAL
  Filled 2017-06-03 (×2): qty 1

## 2017-06-03 MED ORDER — MUPIROCIN CALCIUM 2 % EX CREA
TOPICAL_CREAM | Freq: Every day | CUTANEOUS | Status: DC
  Administered 2017-06-03 – 2017-06-04 (×2): via TOPICAL
  Filled 2017-06-03: qty 15

## 2017-06-03 MED ORDER — POTASSIUM CHLORIDE CRYS ER 20 MEQ PO TBCR
20.0000 meq | EXTENDED_RELEASE_TABLET | Freq: Once | ORAL | Status: AC
  Administered 2017-06-03: 20 meq via ORAL
  Filled 2017-06-03: qty 1

## 2017-06-03 NOTE — Consult Note (Addendum)
WOC Nurse wound consult note Reason for Consult:Consult requested for bilat feet.  Pt has chronic callous areas to bilat plantar feet which are darker-colored, dry and hard.  Left foot 1X.8cm and right foot .8X.8cm.  No odor, drainage, fluctuance, or open wounds.  Left great toe with full thickness wound from when his toenail fell off prior to admission.  Affected area is approx 2X1.5X.2cm, red dry wound bed, no odor, small amt tan drainage.  Dry scabbed wound edges. Dressing procedure/placement/frequency: Bactroban to promote moist healing to all sites and foam dressings to protect from further injury.  Discussed plan of care with patient. Please re-consult if further assistance is needed.  Thank-you,  Cammie Mcgeeawn Freada Twersky MSN, RN, CWOCN, [place], CNS [phone]

## 2017-06-03 NOTE — Care Management Obs Status (Signed)
MEDICARE OBSERVATION STATUS NOTIFICATION   Patient Details  Name: [name] MRN: [id number] Date of Birth: May 19, 1934   Medicare Observation Status Notification Given:  Yes    Chapman FitchBOWEN, Ligaya Cormier T, RN 06/03/2017, 1:43 PM

## 2017-06-03 NOTE — Care Management (Signed)
Patient admitted for arthritis.  Patient lives at home with grandson. PCP Karamalegos. Patient states he has a RW in the home, however does not use it due to pain in his hands.  PT has recommended SNF.  Bed offers have been presented, and patient selected [facility].  CSW has updated family.  [facility] notified.  RNCM signing off.

## 2017-06-03 NOTE — Progress Notes (Signed)
Per Dr. Amado CoeGouru okay to place order for wound consult for possible ulcers and toe nail removed.

## 2017-06-03 NOTE — [place] FL2 (Signed)
[name] MEDICAID FL2 LEVEL OF CARE SCREENING TOOL     IDENTIFICATION  Patient Name: [name] Birthdate: Nov 24, 1934 Sex: male Admission Date (Current Location): 06/01/2017  [place]   Facility and Address:  [facility], [address], [place], [place] [address]      Provider Number: [id number]  Attending Physician Name and Address:  Ramonita LabGouru, Aruna, MD  Relative Name and Phone Number:  Glenice LaineFortune,[name] Grandson [phone]  [phone] or Salvadore OxfordFortune,Belicina Daughter [phone] or Gantt,Linda Daughter [phone]     Current Level of Care: Hospital Recommended Level of Care: Skilled Nursing Facility Prior Approval Number:    Date Approved/Denied:   PASRR Number: [id number]  Discharge Plan: SNF    Current Diagnoses: Patient Active Problem List   Diagnosis Date Noted  . Right knee pain 06/02/2017  . Post-traumatic osteoarthritis of multiple joints 04/28/2017  . Abnormality of plasma protein 03/25/2017  . Elevated beta-2 microglobulin 03/25/2017  . Right leg DVT (HCC) 03/15/2017  . GERD (gastroesophageal reflux disease) 03/10/2017  . Hyperlipidemia, unspecified 03/10/2017  . Peripheral arterial occlusive disease (HCC) 11/24/2016  . Bilateral lower extremity edema 10/19/2016  . Congestive heart failure (CHF) (HCC) 06/14/2016  . Medication monitoring encounter 06/14/2016  . Hyperthyroidism 12/04/2015  . Unsteady gait 12/04/2015  . 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 Abnormal serum protein electrophoresis 12/05/2014  . Pressure ulcer 11/15/2014  . Hyperproteinemia 11/02/2014  . Hypoalbuminemia 11/02/2014  . Arthritis 10/23/2014  . Arteriosclerosis of coronary artery 10/23/2014  . Type 2 diabetes, controlled, with neuropathy (HCC) 10/23/2014  . Amputation of finger of left hand  10/23/2014  . History of pulmonary embolism 10/23/2014  . Literacy problem 10/23/2014  . Umbilical hernia without obstruction or gangrene 10/23/2014  . Peripheral neuropathy 10/23/2014  . History of osteomyelitis 10/23/2014  . Artificial cardiac pacemaker 08/10/2013  . H/O coronary artery bypass surgery 08/10/2013  . GIB (gastrointestinal bleeding) 07/18/2013  . Benign prostatic hyperplasia with urinary obstruction 09/27/2012  . Abnormal prostate specific antigen 09/27/2012  . Benign localized hyperplasia of prostate with urinary obstruction and other lower urinary tract symptoms (LUTS)(600.21) 09/27/2012  . Pulmonary embolism (HCC) 08/15/2012  . Cardiomyopathy (HCC) 08/09/2011    Orientation RESPIRATION BLADDER Height & Weight     Self, Time, Situation, Place  Normal Continent Weight: 202 lb (91.6 kg) Height:  5\' 10"  (177.8 cm)  BEHAVIORAL SYMPTOMS/MOOD NEUROLOGICAL BOWEL NUTRITION STATUS      Continent Diet(Cardiac diet)  AMBULATORY STATUS COMMUNICATION OF NEEDS Skin   Limited Assist Verbally Skin abrasions                       Personal Care Assistance Level of Assistance  Bathing, Feeding, Dressing Bathing Assistance: Limited assistance Feeding assistance: Independent Dressing Assistance: Limited assistance     Functional Limitations Info  Hearing, Sight, Speech Sight Info: Adequate Hearing Info: Adequate Speech Info: Adequate    SPECIAL CARE FACTORS FREQUENCY  PT (By licensed PT)     PT Frequency: 5x a week              Contractures      Additional Factors Info  Code Status, Allergies Code Status Info: Full Code Allergies Info: OXYCODONE-ACETAMINOPHEN, PERCOCET OXYCODONE-ACETAMINOPHEN            Current Medications (06/03/2017):  This is the current hospital active medication list Current Facility-Administered Medications  Medication Dose  Route Frequency Provider Last Rate Last Dose  . acetaminophen (TYLENOL) tablet 500 mg  500 mg Oral Q8H PRN  Alford Highland, MD      . allopurinol (ZYLOPRIM) tablet 100 mg  100 mg Oral Daily Gouru, Aruna, MD   100 mg at 06/03/17 1128  . apixaban (ELIQUIS) tablet 5 mg  5 mg Oral BID Alford Highland, MD   5 mg at 06/03/17 1126  . aspirin EC tablet 81 mg  81 mg Oral Daily Alford Highland, MD   81 mg at 06/03/17 1127  . atorvastatin (LIPITOR) tablet 40 mg  40 mg Oral q morning - 10a Alford Highland, MD   40 mg at 06/03/17 1126  . bisoprolol (ZEBETA) tablet 5 mg  5 mg Oral Daily Alford Highland, MD   5 mg at 06/03/17 1128  . cephALEXin (KEFLEX) capsule 500 mg  500 mg Oral Q8H Wieting, Richard, MD   500 mg at 06/03/17 0538  . furosemide (LASIX) tablet 20 mg  20 mg Oral Daily Alford Highland, MD   20 mg at 06/03/17 1127  . gabapentin (NEURONTIN) capsule 600 mg  600 mg Oral TID Alford Highland, MD   600 mg at 06/03/17 1126  . ibuprofen (ADVIL,MOTRIN) tablet 200 mg  200 mg Oral Q6H PRN Alford Highland, MD   200 mg at 06/03/17 1126  . ondansetron (ZOFRAN) tablet 4 mg  4 mg Oral Q6H PRN Alford Highland, MD       Or  . ondansetron (ZOFRAN) injection 4 mg  4 mg Intravenous Q6H PRN Wieting, Richard, MD      . pantoprazole (PROTONIX) EC tablet 40 mg  40 mg Oral Daily Alford Highland, MD   40 mg at 06/03/17 1128  . potassium chloride (K-DUR,KLOR-CON) CR tablet 10 mEq  10 mEq Oral Daily Alford Highland, MD   10 mEq at 06/03/17 1126  . predniSONE (DELTASONE) tablet 40 mg  40 mg Oral Q breakfast Alford Highland, MD   40 mg at 06/03/17 1125  . traMADol (ULTRAM) tablet 50 mg  50 mg Oral Q6H PRN Alford Highland, MD   50 mg at 06/03/17 0221     Discharge Medications: Please see discharge summary for a list of discharge medications.  Relevant Imaging Results:  Relevant Lab Results:   Additional Information SSN [id number]  Darleene Cleaver, [place]

## 2017-06-03 NOTE — Care Management CC44 (Signed)
Condition Code 44 Documentation Completed  Patient Details  Name: Delane Gingerrthur E Noori MRN: [id number] Date of Birth: Jul 13, 1934   Condition Code 44 given:  Yes Patient signature on Condition Code 44 notice:  Yes Documentation of 2 MD's agreement:  Yes Code 44 added to claim:  Yes    Chapman FitchBOWEN, Tyvon Eggenberger T, RN 06/03/2017, 1:43 PM

## 2017-06-03 NOTE — Progress Notes (Signed)
Per Dr. Nemiah CommanderKalisetti okay for pt to have no IV access after 4 attempts.

## 2017-06-03 NOTE — Progress Notes (Signed)
[facility] [place] [facility]   PATIENT NAME: [name]    MR#:  [id number]  DATE OF BIRTH:  [birth year]  SUBJECTIVE:  CHIEF COMPLAINT: Patient was seen and examined.  He thinks his pain is getting better and he will be able to go home tomorrow, does not like going to skilled care facility  REVIEW OF SYSTEMS:  CONSTITUTIONAL: No fever, fatigue or weakness.  EYES: No blurred or double vision.  EARS, NOSE, AND THROAT: No tinnitus or ear pain.  RESPIRATORY: No cough, shortness of breath, wheezing or hemoptysis.  CARDIOVASCULAR: No chest pain, orthopnea, edema.  GASTROINTESTINAL: No nausea, vomiting, diarrhea or abdominal pain.  GENITOURINARY: No dysuria, hematuria.  ENDOCRINE: No polyuria, nocturia,  HEMATOLOGY: No anemia, easy bruising or bleeding SKIN: No rash or lesion. MUSCULOSKELETAL: Reporting right knee pain and right foot pain  nEUROLOGIC: No tingling, numbness, weakness.  PSYCHIATRY: No anxiety or depression.   DRUG ALLERGIES:   Allergies  Allergen Reactions  . Oxycodone-Acetaminophen Other (See Comments)    Causes the pt to be very irritable, says he can take this  . Percocet [Oxycodone-Acetaminophen] Other (See Comments)    Reaction:  Irritability     VITALS:  Blood pressure 137/72, pulse 60, temperature 97.6 F (36.4 C), temperature source Oral, resp. rate 18, height 5\' 10"  (1.778 m), weight 91.6 kg (202 lb), SpO2 99 %.  PHYSICAL EXAMINATION:  GENERAL:  83 y.o.-year-old patient lying in the bed with no acute distress.  EYES: Pupils equal, round, reactive to light and accommodation. No scleral icterus. Extraocular muscles intact.  HEENT: Head atraumatic, normocephalic. Oropharynx and nasopharynx clear.  NECK:  Supple, no jugular venous distention. No thyroid enlargement, no tenderness.  LUNGS: Normal breath sounds bilaterally, no wheezing, rales,rhonchi or crepitation. No use of accessory muscles of respiration.  CARDIOVASCULAR:  S1, S2 normal. No murmurs, rubs, or gallops.  ABDOMEN: Soft, nontender, nondistended. Bowel sounds present. EXTREMITIES: 3+ pedal edema, no cyanosis, or clubbing.  Positive joint deformities bilateral hands.  Right knee edematous with joint effusion and patient unable to bend the right knee at all.  Tender to touch NEUROLOGIC: Cranial nerves II through XII are intact.  Sensation intact. Gait not checked.  PSYCHIATRIC: The patient is alert and oriented x 3.  SKIN: No obvious rash, lesion, or ulcer.    LABORATORY PANEL:   CBC Recent Labs  Lab 06/02/17 1109  WBC 6.2  HGB 12.9*  HCT 36.8*  PLT 219   ------------------------------------------------------------------------------------------------------------------  Chemistries  Recent Labs  Lab 06/02/17 1109 06/03/17 0422  NA 136  --   K 3.3*  --   CL 108  --   CO2 23  --   GLUCOSE 124*  --   BUN 15  --   CREATININE 1.10  --   CALCIUM 8.6*  --   AST  --  31  ALT  --  12*  ALKPHOS  --  87  BILITOT  --  0.4   ------------------------------------------------------------------------------------------------------------------  Cardiac Enzymes No results for input(s): TROPONINI in the last 168 hours. ------------------------------------------------------------------------------------------------------------------  RADIOLOGY:  Dg Knee 2 Views Right  Result Date: 06/01/2017 CLINICAL DATA:  Pt reports that he fell on his right side approx a month ago, but stated that he did not have any recent injury. Pt says that he was sitting in his chair today when his right hip and knee randomly started hurting. Pt leg appears .*comment was truncated* EXAM: RIGHT KNEE - 1-2 VIEW COMPARISON:  None. FINDINGS:  No fracture of the proximal tibia or distal femur. Patella is normal. No joint effusion. IMPRESSION: No fracture or dislocation. Electronically Signed   By: Genevive Bi M.D.   On: 06/01/2017 19:54   Dg Chest Portable 1 View  Result  Date: 06/01/2017 CLINICAL DATA:  Fall.  Possible hip fracture EXAM: PORTABLE CHEST 1 VIEW COMPARISON:  04/29/2017 FINDINGS: Cardiac enlargement without heart failure. AICD and prior CABG. Lungs clear without infiltrate or effusion. IMPRESSION: No active disease. Electronically Signed   By: Marlan Palau M.D.   On: 06/01/2017 19:58   Dg Hip Unilat With Pelvis 2-3 Views Right  Result Date: 06/01/2017 CLINICAL DATA:  Pt reports that he fell on his right side approx a month ago, but stated that he did not have any recent injury. Pt says that he was sitting in his chair today when his right hip and knee randomly started hurting. Pt leg appears .*comment was truncated* EXAM: DG HIP (WITH OR WITHOUT PELVIS) 2-3V RIGHT COMPARISON:  None. FINDINGS: Hips are located. No evidence of pelvic fracture or sacral fracture. Dedicated view of the RIGHT hip demonstrates no femoral neck fracture. IMPRESSION: No significant arthropathy or trauma the pelvis or RIGHT hip. Electronically Signed   By: Genevive Bi M.D.   On: 06/01/2017 19:56    EKG:   Orders placed or performed during the hospital encounter of 04/29/17  . ED EKG  . ED EKG  . EKG 12-Lead  . EKG 12-Lead    ASSESSMENT AND PLAN:   1.  Inflammatory  arthritis.   Probably gouty arthritis with prepatellar bursitis Patient was given IV Solu-Medrol and changed to p.o. Prednisone Allopurinol is added to the regimen as patient has hyperuricemia Analgesics and heating pad as needed Seen by rheumatology Dr. Gavin Potters  PT is recommending skilled nursing facility   2.  History of  systolic congestive heart failure.   On low-dose Lasix.  Added low-dose bisoprolol.  likely will have to add low-dose ACE inhibitor soon.  3.  History of DVT on Eliquis 4.  History of dementia and depression 5.  Hyperlipidemia unspecified on atorvastatin 6.  GERD on Protonix 7.  Neuropathy on gabapentin 8.  Essential hypertension.  Add bisoprolol  PT has a commended skilled  nursing facility.  I had a lengthy discussion with the patient patient prefers going home with home health.  Discussed with social worker Anticipating discharge tomorrow  All the records are reviewed and case discussed with Care Management/Social Workerr. Management plans discussed with the patient, family and they are in agreement.  CODE STATUS: fc   TOTAL TIME TAKING CARE OF THIS PATIENT: 36  minutes.   POSSIBLE D/C IN 1  DAYS, DEPENDING ON CLINICAL CONDITION.  Note: This dictation was prepared with Dragon dictation along with smaller phrase technology. Any transcriptional errors that result from this process are unintentional.   Ramonita Lab M.D on 06/03/2017 at 2:56 PM  Between 7am to 6pm - Pager - [phone] After 6pm go to [email or web address] - password [email or web address] [facility]  [facility]  Office  [phone]  CC: Primary care physician; Smitty Cords, DO

## 2017-06-03 NOTE — Clinical Social Work Note (Signed)
CSW spoke to patient's daughter Viviano SimasBelicina [phone] and presented bed offers.  Patient's daughter agreed to have patient go to [facility].  CSW contacted [facility], and they can accept patient over the weekend once they have insurance authorization.  [facility] started insurance authorization.  Case manager spoke to patient and he agreed to go to [facility] for short term rehab.  CSW to continue to follow patient's progress throughout discharge planning.  Ervin KnackEric R. Hassan Rowannterhaus, MSW, Theresia MajorsLCSWA [phone]  06/03/2017 4:48 PM

## 2017-06-03 NOTE — Clinical Social Work Note (Signed)
Clinical Social Work Assessment  Patient Details  Name: [name] MRN: [id number] Date of Birth: Jan 12, 1935  Date of referral:  06/03/17               Reason for consult:  Facility Placement                Permission sought to share information with:  Facility Medical sales representative, Family Supports Permission granted to share information::  Yes, Verbal Permission Granted  Name::     [name], [name] [phone]  [phone] or [name], [name] Daughter [phone] or [name], [name] Daughter [phone]   Agency::  SNF admissions  Relationship::     Contact Information:     Housing/Transportation Living arrangements for the past 2 months:  Single Family Home Source of Information:  Patient, Adult Children Patient Interpreter Needed:  None Criminal Activity/Legal Involvement Pertinent to Current Situation/Hospitalization:  No - Comment as needed Significant Relationships:  Adult Children, Other Family Members Lives with:    Do you feel safe going back to the place where you live?  No Need for family participation in patient care:  Yes (Comment)  Care giving concerns:  Patient feels he needs some short term rehab before he is able to return back home.   Social Worker assessment / plan:  Patient is an 83 year old male who is alert and oriented x3, patient lives with his grandson, patient has a daughter who is involved in his care plan and helping with decisions.  Patient states he has been to a rehab facility in [place], but not locally.  Patient expressed he did not like the one in [place], which is why he was hesitant about going again but he did agree to go to SNF.  CSW attempted to explain how insurance will pay for the stay but then patient asked this CSW to leave.  Case manager went to speak with patient, and he agreed to go to SNF.  CSW was given permission to begin bed search in [place].  Patient did not have any other questions or concerns.  Employment  status:  Retired Database administrator PT Recommendations:  Skilled Nursing Facility Information / Referral to community resources:  Skilled Nursing Facility  Patient/Family's Response to care:  Patient and his daughter are in agreement to having patient go to SNF for short term rehab.  Patient/Family's Understanding of and Emotional Response to Diagnosis, Current Treatment, and Prognosis:  Patient expressed frustration to this CSW about his last hospitalization, and how he felt like he was not ready for discharge yet.  Patient expressed that he did not feel like he is ready for discharge tomorrow, CSW explained if MD feels that he is medically ready for discharge then he has to discharge.  Patient then said, I am going to stay as long as I feel like I need to, patient then asked this CSW to leave and not come back, case manager spoke to patient and he was more pleasant.  Emotional Assessment Appearance:  Appears stated age Attitude/Demeanor/Rapport:    Affect (typically observed):  Appropriate, Agitated, Frustrated, Stable Orientation:  Oriented to Self, Oriented to Place, Oriented to  Time Alcohol / Substance use:  Not Applicable Psych involvement (Current and /or in the community):  No (Comment)  Discharge Needs  Concerns to be addressed:  Lack of Support Readmission within the last 30 days:  No Current discharge risk:  Lack of support system Barriers to Discharge:  Continued Medical Work up   [employer],  [name], LCSWA 06/03/2017, 4:51 PM

## 2017-06-04 MED ORDER — TRAMADOL HCL 50 MG PO TABS: 50.0000 mg | ORAL_TABLET | Freq: Four times a day (QID) | ORAL | 0 refills | Status: DC | PRN

## 2017-06-04 MED ORDER — ONDANSETRON HCL 4 MG PO TABS: 4.0000 mg | ORAL_TABLET | Freq: Four times a day (QID) | ORAL | 0 refills | Status: AC | PRN

## 2017-06-04 MED ORDER — MUPIROCIN CALCIUM 2 % EX CREA: TOPICAL_CREAM | Freq: Every day | CUTANEOUS | 0 refills | Status: AC

## 2017-06-04 MED ORDER — PREDNISONE 10 MG (21) PO TBPK: 10.0000 mg | ORAL_TABLET | Freq: Every day | ORAL | 0 refills | Status: DC

## 2017-06-04 MED ORDER — ALLOPURINOL 100 MG PO TABS: 100.0000 mg | ORAL_TABLET | Freq: Every day | ORAL | Status: AC

## 2017-06-04 MED ORDER — BISOPROLOL FUMARATE 5 MG PO TABS: 5.0000 mg | ORAL_TABLET | Freq: Every day | ORAL | Status: AC

## 2017-06-04 MED ORDER — APIXABAN 5 MG PO TABS: 5.0000 mg | ORAL_TABLET | Freq: Two times a day (BID) | ORAL | Status: DC

## 2017-06-04 MED ORDER — CEPHALEXIN 500 MG PO CAPS: 500.0000 mg | ORAL_CAPSULE | Freq: Three times a day (TID) | ORAL | 0 refills | Status: DC

## 2017-06-04 NOTE — Discharge Summary (Signed)
[facility] - [place] at [facility]   PATIENT NAME: [name]    MR#:  [id number]  DATE OF BIRTH:  [birth year]  DATE OF ADMISSION:  06/01/2017 ADMITTING PHYSICIAN: Alford Highland, MD  DATE OF DISCHARGE:  06/04/17  PRIMARY CARE PHYSICIAN: Smitty Cords, DO    ADMISSION DIAGNOSIS:  Pain [R52] Chronic pain of both lower extremities [M79.604, M79.605, G89.29]  DISCHARGE DIAGNOSIS:  Right knee pain secondary to inflammatory arthritis  SECONDARY DIAGNOSIS:   Past Medical History:  Diagnosis Date  . Arthritis   . CAD (coronary artery disease)   . Congestive heart failure (CHF) (HCC) 06/14/2016   LVEF 40-45% 2016 echo; Dr. Darrold Junker  . COPD (chronic obstructive pulmonary disease) (HCC)   . Dementia   . Depression   . Diabetes mellitus without complication (HCC)   . Family history of adverse reaction to anesthesia    mom seizures   . GERD (gastroesophageal reflux disease)   . Heart disease   . History of blood clots in legs   . History of ischemic cardiomyopathy   . 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 Hyperlipidemia   . Hypertension   . Literacy level of illiterate   . Osteomyelitis (HCC)   . PN (peripheral neuropathy)   . Psoriasis     HOSPITAL COURSE:   1.Inflammatory  arthritis.  Probably gouty arthritis with prepatellar bursitis Patient was given IV Solu-Medrol and changed to p.o. Prednisone, taper as tolerated Allopurinol is added to the regimen as patient has hyperuricemia Analgesics and heating pad as needed Seen by rheumatology Dr. Gavin Potters , patient is refusing to see rheumatology as an outpatient PT is recommending skilled nursing facility   2. History of systolic congestive heart failure. On low-dose Lasix.Added low-dose bisoprolol.likely will have to add low-dose ACE inhibitor soon.  3. History of DVT on Eliquis 4. History of dementia and depression 5. Hyperlipidemia unspecified on atorvastatin 6.  GERD on Protonix 7. Neuropathy on gabapentin 8. Essential hypertension. Add bisoprolol  PT has a commended skilled nursing facility.  I had a lengthy discussion with the patient patient prefers going home with home health yesterday, patient changed his mind and does not feel comfortable to go home as he lives alone and does not think he can do better with home health PT , wants to go to rehab center discussed with Child psychotherapist.  Discharge patient to [facility]   DISCHARGE CONDITIONS:   Stable  CONSULTS OBTAINED:     PROCEDURES   DRUG ALLERGIES:   Allergies  Allergen Reactions  . Oxycodone-Acetaminophen Other (See Comments)    Causes the pt to be very irritable, says he can take this  . Percocet [Oxycodone-Acetaminophen] Other (See Comments)    Reaction:  Irritability     DISCHARGE MEDICATIONS:   Allergies as of 06/04/2017      Reactions   Oxycodone-acetaminophen Other (See Comments)   Causes the pt to be very irritable, says he can take this   Percocet [oxycodone-acetaminophen] Other (See Comments)   Reaction:  Irritability       Medication List    TAKE these medications   acetaminophen 500 MG tablet Commonly known as:  TYLENOL Take 500 mg by mouth every 8 (eight) hours as needed for mild pain or fever.   allopurinol 100 MG tablet Commonly known as:  ZYLOPRIM Take 1 tablet (100 mg total) by mouth daily. Start taking on:  06/05/2017   apixaban 5 MG Tabs tablet Commonly known as:  ELIQUIS Take 1  tablet (5 mg total) by mouth 2 (two) times daily. What changed:    how much to take  how to take this  when to take this  additional instructions   aspirin EC 81 MG tablet Take 1 tablet (81 mg total) by mouth daily.   atorvastatin 40 MG tablet Commonly known as:  LIPITOR Take 1 tablet (40 mg total) by mouth at bedtime.   bisoprolol 5 MG tablet Commonly known as:  ZEBETA Take 1 tablet (5 mg total) by mouth daily. Start taking on:  06/05/2017    cephALEXin 500 MG capsule Commonly known as:  KEFLEX Take 1 capsule (500 mg total) by mouth 3 (three) times daily.   furosemide 20 MG tablet Commonly known as:  LASIX Take 1 tablet (20 mg total) by mouth daily.   gabapentin 300 MG capsule Commonly known as:  NEURONTIN Take 2 capsules (600 mg total) by mouth 3 (three) times daily. Take 1 pill AM, Take 1 pill Mid-day, Take 2 pills PM x 3 days Take 1 pill AM, Take 2 pills Mid-day, Take 2 pills PM x 3 days Take 2 pills AM, Take 2 pills Mid-day, Take 2 pills PM   ibuprofen 200 MG tablet Commonly known as:  ADVIL,MOTRIN Take 200 mg by mouth every 6 (six) hours as needed for mild pain. Pt takes twice a day, am/pm   mupirocin cream 2 % Commonly known as:  BACTROBAN Apply topically daily. Start taking on:  06/05/2017   ondansetron 4 MG tablet Commonly known as:  ZOFRAN Take 1 tablet (4 mg total) by mouth every 6 (six) hours as needed for nausea.   pantoprazole 40 MG tablet Commonly known as:  PROTONIX Take 1 tablet (40 mg total) by mouth daily.   potassium chloride 10 MEQ tablet Commonly known as:  K-DUR Take 1 tablet (10 mEq total) by mouth daily.   predniSONE 10 MG (21) Tbpk tablet Commonly known as:  STERAPRED UNI-PAK 21 TAB Take 1 tablet (10 mg total) by mouth daily. Take  4 tablets by mouth for 1 day followed by  3 tablets by mouth for 1 day followed by  2 tablets by mouth for 1 day followed by  1 tablet by mouth for a day and stop   traMADol 50 MG tablet Commonly known as:  ULTRAM Take 1 tablet (50 mg total) by mouth every 6 (six) hours as needed for moderate pain or severe pain.   Wrist Brace/Right Large Misc Wear on the right wrist, cock-up wrist splint for carpal tunnel   TRUFORM STOCKINGS 20-30MMHG Misc Wear on both legs; put on first thing in the morning and remove before bed; do NOT sleep in them        DISCHARGE INSTRUCTIONS:   Follow-up with primary care physician in 4 to 5 days or sooner as  needed Follow-up with rheumatology as needed DIET:  Cardiac diet  DISCHARGE CONDITION:  Stable  ACTIVITY:  Activity as tolerated  OXYGEN:  Home Oxygen: No.   Oxygen Delivery: room air  DISCHARGE LOCATION:  nursing home   If you experience worsening of your admission symptoms, develop shortness of breath, life threatening emergency, suicidal or homicidal thoughts you must seek medical attention immediately by calling 911 or calling your MD immediately  if symptoms less severe.  You Must read complete instructions/literature along with all the possible adverse reactions/side effects for all the Medicines you take and that have been prescribed to you. Take any new Medicines after you have completely  understood and accpet all the possible adverse reactions/side effects.   Please note  You were cared for by a hospitalist during your hospital stay. If you have any questions about your discharge medications or the care you received while you were in the hospital after you are discharged, you can call the unit and asked to speak with the hospitalist on call if the hospitalist that took care of you is not available. Once you are discharged, your primary care physician will handle any further medical issues. Please note that NO REFILLS for any discharge medications will be authorized once you are discharged, as it is imperative that you return to your primary care physician (or establish a relationship with a primary care physician if you do not have one) for your aftercare needs so that they can reassess your need for medications and monitor your lab values.     Today  Chief Complaint  Patient presents with  . Leg Pain   Patient reports the right knee pain significantly improved, able to move his knee when compared to yesterday.  No other complaints  ROS:  CONSTITUTIONAL: Denies fevers, chills. Denies any fatigue, weakness.  EYES: Denies blurry vision, double vision, eye pain. EARS,  NOSE, THROAT: Denies tinnitus, ear pain, hearing loss. RESPIRATORY: Denies cough, wheeze, shortness of breath.  CARDIOVASCULAR: Denies chest pain, palpitations, edema.  GASTROINTESTINAL: Denies nausea, vomiting, diarrhea, abdominal pain. Denies bright red blood per rectum. GENITOURINARY: Denies dysuria, hematuria. ENDOCRINE: Denies nocturia or thyroid problems. HEMATOLOGIC AND LYMPHATIC: Denies easy bruising or bleeding. SKIN: Denies rash or lesion. MUSCULOSKELETAL: Denies pain in neck, back, shoulder, hips or arthritic symptoms.  Less right knee pain NEUROLOGIC: Denies paralysis, paresthesias.  PSYCHIATRIC: Denies anxiety or depressive symptoms.   VITAL SIGNS:  Blood pressure 124/61, pulse (!) 59, temperature 98.2 F (36.8 C), temperature source Oral, resp. rate 20, height 5\' 10"  (1.778 m), weight 91.6 kg (202 lb), SpO2 96 %.  I/O:    Intake/Output Summary (Last 24 hours) at 06/04/2017 1050 Last data filed at 06/04/2017 0900 Gross per 24 hour  Intake 1440 ml  Output 2200 ml  Net -760 ml    PHYSICAL EXAMINATION:  GENERAL:  83 y.o.-year-old patient lying in the bed with no acute distress.  EYES: Pupils equal, round, reactive to light and accommodation. No scleral icterus. Extraocular muscles intact.  HEENT: Head atraumatic, normocephalic. Oropharynx and nasopharynx clear.  NECK:  Supple, no jugular venous distention. No thyroid enlargement, no tenderness.  LUNGS: Normal breath sounds bilaterally, no wheezing, rales,rhonchi or crepitation. No use of accessory muscles of respiration.  CARDIOVASCULAR: S1, S2 normal. No murmurs, rubs, or gallops.  ABDOMEN: Soft, non-tender, non-distended. Bowel sounds present. No organomegaly or mass.  EXTREMITIES: No pedal edema, cyanosis, or clubbing.  NEUROLOGIC: Cranial nerves II through XII are intact. Muscle strength 5/5 in all extremities except right lower extremity.  The right knee is less tender and less edematous .sensation intact. Gait not  checked.  PSYCHIATRIC: The patient is alert and oriented x 3.  SKIN: No obvious rash, lesion, or ulcer.   DATA REVIEW:   CBC Recent Labs  Lab 06/02/17 1109  WBC 6.2  HGB 12.9*  HCT 36.8*  PLT 219    Chemistries  Recent Labs  Lab 06/02/17 1109 06/03/17 0422  NA 136  --   K 3.3*  --   CL 108  --   CO2 23  --   GLUCOSE 124*  --   BUN 15  --   CREATININE  1.10  --   CALCIUM 8.6*  --   AST  --  31  ALT  --  12*  ALKPHOS  --  87  BILITOT  --  0.4    Cardiac Enzymes No results for input(s): TROPONINI in the last 168 hours.  Microbiology Results  Results for orders placed or performed in visit on 07/08/16  Wound culture     Status: None   Collection Time: 07/08/16  3:50 PM  Result Value Ref Range Status   Gram Stain Moderate  Final   Gram Stain WBC present-both PMN and Mononuclear  Final   Gram Stain No Squamous Epithelial Cells Seen  Final   Gram Stain   Final    Abundant Gram Positive Cocci In Pairs In Chains In Clusters   Gram Stain Abundant Gram Negative Rods  Final   Organism ID, Bacteria Multiple Organisms Present,None Predominant  Final    Comment: No Staphylococcus aureus isolated NO GROUP A STREP (S. PYOGENES) ISOLATED     RADIOLOGY:  Dg Knee 2 Views Right  Result Date: 06/01/2017 CLINICAL DATA:  Pt reports that he fell on his right side approx a month ago, but stated that he did not have any recent injury. Pt says that he was sitting in his chair today when his right hip and knee randomly started hurting. Pt leg appears .*comment was truncated* EXAM: RIGHT KNEE - 1-2 VIEW COMPARISON:  None. FINDINGS: No fracture of the proximal tibia or distal femur. Patella is normal. No joint effusion. IMPRESSION: No fracture or dislocation. Electronically Signed   By: Genevive BiStewart  Edmunds M.D.   On: 06/01/2017 19:54   Dg Chest Portable 1 View  Result Date: 06/01/2017 CLINICAL DATA:  Fall.  Possible hip fracture EXAM: PORTABLE CHEST 1 VIEW COMPARISON:  04/29/2017 FINDINGS:  Cardiac enlargement without heart failure. AICD and prior CABG. Lungs clear without infiltrate or effusion. IMPRESSION: No active disease. Electronically Signed   By: Marlan Palauharles  Clark M.D.   On: 06/01/2017 19:58   Dg Hip Unilat With Pelvis 2-3 Views Right  Result Date: 06/01/2017 CLINICAL DATA:  Pt reports that he fell on his right side approx a month ago, but stated that he did not have any recent injury. Pt says that he was sitting in his chair today when his right hip and knee randomly started hurting. Pt leg appears .*comment was truncated* EXAM: DG HIP (WITH OR WITHOUT PELVIS) 2-3V RIGHT COMPARISON:  None. FINDINGS: Hips are located. No evidence of pelvic fracture or sacral fracture. Dedicated view of the RIGHT hip demonstrates no femoral neck fracture. IMPRESSION: No significant arthropathy or trauma the pelvis or RIGHT hip. Electronically Signed   By: Genevive BiStewart  Edmunds M.D.   On: 06/01/2017 19:56    EKG:   Orders placed or performed during the hospital encounter of 04/29/17  . ED EKG  . ED EKG  . EKG 12-Lead  . EKG 12-Lead      Management plans discussed with the patient, family and they are in agreement.  CODE STATUS:     Code Status Orders  (From admission, onward)        Start     Ordered   06/02/17 1046  Full code  Continuous     06/02/17 1046    Code Status History    Date Active Date Inactive Code Status Order ID Comments User Context   10/25/2016 2127 10/27/2016 1523 Full Code [id number]  Gracelyn NurseJohnston, John D, MD Inpatient   12/01/2015 2252 12/04/2015 2032  Full Code [id number]  Tonye Royalty, DO Inpatient   08/09/2015 1836 08/12/2015 1856 Full Code [id number]  Arnaldo Natal, MD Inpatient   11/14/2014 1558 11/15/2014 2135 Full Code [id number]  Auburn Bilberry, MD Inpatient      TOTAL TIME TAKING CARE OF THIS PATIENT: 43 minutes.   Note: This dictation was prepared with Dragon dictation along with smaller phrase technology. Any transcriptional errors that result from  this process are unintentional.   @[facility] @  on 06/04/2017 at 10:50 AM  Between 7am to 6pm - Pager - [phone]  After 6pm go to [email or web address] - password [email or web address] [facility]  [facility]  Office  [phone]  CC: Primary care physician; Smitty Cords, DO

## 2017-06-04 NOTE — Clinical Social Work Placement (Signed)
   CLINICAL SOCIAL WORK PLACEMENT  NOTE  Date:  06/04/2017  Patient Details  Name: [name] MRN: [id number] Date of Birth: 1934/05/25  Clinical Social Work is seeking post-discharge placement for this patient at the Skilled  Nursing Facility level of care (*CSW will initial, date and re-position this form in  chart as items are completed):  Yes   Patient/family provided with [facility] Clinical Social Work Department's list of facilities offering this level of care within the geographic area requested by the patient (or if unable, by the patient's family).  Yes   Patient/family informed of their freedom to choose among providers that offer the needed level of care, that participate in Medicare, Medicaid or managed care program needed by the patient, have an available bed and are willing to accept the patient.  Yes   Patient/family informed of [facility]'s ownership interest in [facility] and [facility], as well as of the fact that they are under no obligation to receive care at these facilities.  PASRR submitted to EDS on 06/03/17     PASRR number received on       Existing PASRR number confirmed on 06/03/17     FL2 transmitted to all facilities in geographic area requested by pt/family on 06/03/17     FL2 transmitted to all facilities within larger geographic area on       Patient informed that his/her managed care company has contracts with or will negotiate with certain facilities, including the following:        Yes   Patient/family informed of bed offers received.  Patient chooses bed at [facility]     Physician recommends and patient chooses bed at      Patient to be transferred to [facility] on  .  Patient to be [facility] facility [facility]     Patient family notified on   of transfer.  Name of family member notified:        PHYSICIAN Please sign FL2     Additional Comment:     _______________________________________________ Judi CongKaren M Marsena Taff, LCSW 06/04/2017, 10:33 AM

## 2017-06-04 NOTE — Clinical Social Work Note (Addendum)
CSW aware that the patient will discharge today. CSW will deliver the discharge packet once the discharge summary is available. CSW is following.  UPDATE: The patient will discharge today to [facility] via non-emergent EMS. The facility and patient are aware. The CSW attempted to contact family with no answer and no ability to leave a HIPPA compliant voicemail. The CSW will deliver the discharge packet as soon as possible at which time the CSW will sign off. Please consult should needs arise.  Argentina PonderKaren Martha Yesenia Locurto, MSW, Theresia MajorsLCSWA [phone]

## 2017-06-04 NOTE — Progress Notes (Signed)
[name]  A and O x 4. VSS. Pt tolerating diet well. No complaints of pain or nausea. prescriptions given. Pt voiced understanding of discharge instructions with no further questions. Pt discharged via EMS to [facility] healthcare.     Allergies as of 06/04/2017      Reactions   Oxycodone-acetaminophen Other (See Comments)   Causes the pt to be very irritable, says he can take this   Percocet [oxycodone-acetaminophen] Other (See Comments)   Reaction:  Irritability       Medication List    TAKE these medications   acetaminophen 500 MG tablet Commonly known as:  TYLENOL Take 500 mg by mouth every 8 (eight) hours as needed for mild pain or fever.   allopurinol 100 MG tablet Commonly known as:  ZYLOPRIM Take 1 tablet (100 mg total) by mouth daily. Start taking on:  06/05/2017   apixaban 5 MG Tabs tablet Commonly known as:  ELIQUIS Take 1 tablet (5 mg total) by mouth 2 (two) times daily. What changed:    how much to take  how to take this  when to take this  additional instructions   aspirin EC 81 MG tablet Take 1 tablet (81 mg total) by mouth daily.   atorvastatin 40 MG tablet Commonly known as:  LIPITOR Take 1 tablet (40 mg total) by mouth at bedtime.   bisoprolol 5 MG tablet Commonly known as:  ZEBETA Take 1 tablet (5 mg total) by mouth daily. Start taking on:  06/05/2017   cephALEXin 500 MG capsule Commonly known as:  KEFLEX Take 1 capsule (500 mg total) by mouth 3 (three) times daily.   furosemide 20 MG tablet Commonly known as:  LASIX Take 1 tablet (20 mg total) by mouth daily.   gabapentin 300 MG capsule Commonly known as:  NEURONTIN Take 2 capsules (600 mg total) by mouth 3 (three) times daily. Take 1 pill AM, Take 1 pill Mid-day, Take 2 pills PM x 3 days Take 1 pill AM, Take 2 pills Mid-day, Take 2 pills PM x 3 days Take 2 pills AM, Take 2 pills Mid-day, Take 2 pills PM   ibuprofen 200 MG tablet Commonly known as:  ADVIL,MOTRIN Take 200 mg by  mouth every 6 (six) hours as needed for mild pain. Pt takes twice a day, am/pm   mupirocin cream 2 % Commonly known as:  BACTROBAN Apply topically daily. Start taking on:  06/05/2017   ondansetron 4 MG tablet Commonly known as:  ZOFRAN Take 1 tablet (4 mg total) by mouth every 6 (six) hours as needed for nausea.   pantoprazole 40 MG tablet Commonly known as:  PROTONIX Take 1 tablet (40 mg total) by mouth daily.   potassium chloride 10 MEQ tablet Commonly known as:  K-DUR Take 1 tablet (10 mEq total) by mouth daily.   predniSONE 10 MG (21) Tbpk tablet Commonly known as:  STERAPRED UNI-PAK 21 TAB Take 1 tablet (10 mg total) by mouth daily. Take  4 tablets by mouth for 1 day followed by  3 tablets by mouth for 1 day followed by  2 tablets by mouth for 1 day followed by  1 tablet by mouth for a day and stop   traMADol 50 MG tablet Commonly known as:  ULTRAM Take 1 tablet (50 mg total) by mouth every 6 (six) hours as needed for moderate pain or severe pain.   Wrist Brace/Right Large Misc Wear on the right wrist, cock-up wrist splint for carpal tunnel  TRUFORM STOCKINGS 20-30MMHG Misc Wear on both legs; put on first thing in the morning and remove before bed; do NOT sleep in them       Vitals:   06/04/17 0944 06/04/17 1225  BP: 124/61 129/69  Pulse: (!) 59 63  Resp:  16  Temp:  98.9 F (37.2 C)  SpO2:  99%    [name]

## 2017-06-04 NOTE — Progress Notes (Signed)
RN call pt's family to notified them pt is going to be discharge today, but RN unable to reach family.

## 2017-06-04 NOTE — Progress Notes (Signed)
RN call report to [facility]. Report given to Barbara Graves LPN.  

## 2017-06-09 ENCOUNTER — Ambulatory Visit: Payer: Self-pay | Admitting: Family Medicine

## 2017-06-29 ENCOUNTER — Ambulatory Visit (INDEPENDENT_AMBULATORY_CARE_PROVIDER_SITE_OTHER): Payer: Medicare Other | Admitting: Family Medicine

## 2017-06-29 ENCOUNTER — Encounter: Payer: Self-pay | Admitting: Family Medicine

## 2017-06-29 VITALS — BP 120/54 | HR 62 | Temp 97.5°F | Resp 16 | Ht 72.0 in | Wt 216.0 lb

## 2017-06-29 DIAGNOSIS — M1A9XX0 Chronic gout, unspecified, without tophus (tophi): Secondary | ICD-10-CM | POA: Diagnosis not present

## 2017-06-29 DIAGNOSIS — R600 Localized edema: Secondary | ICD-10-CM

## 2017-06-29 DIAGNOSIS — M1990 Unspecified osteoarthritis, unspecified site: Secondary | ICD-10-CM

## 2017-06-29 DIAGNOSIS — M153 Secondary multiple arthritis: Secondary | ICD-10-CM | POA: Diagnosis not present

## 2017-06-29 DIAGNOSIS — E1140 Type 2 diabetes mellitus with diabetic neuropathy, unspecified: Secondary | ICD-10-CM

## 2017-06-29 DIAGNOSIS — I779 Disorder of arteries and arterioles, unspecified: Secondary | ICD-10-CM

## 2017-06-29 DIAGNOSIS — I82531 Chronic embolism and thrombosis of right popliteal vein: Secondary | ICD-10-CM

## 2017-06-29 DIAGNOSIS — G603 Idiopathic progressive neuropathy: Secondary | ICD-10-CM | POA: Diagnosis not present

## 2017-06-29 MED ORDER — APIXABAN 5 MG PO TABS: 5.0000 mg | ORAL_TABLET | Freq: Two times a day (BID) | ORAL | 2 refills | Status: AC

## 2017-06-29 NOTE — Patient Instructions (Addendum)
Thank you for coming to the office today.  Continue Eliquis  twice daily  Continue Allopurinol for gout daily  Continue Gabapentin  x 2 pills up to 3 times daily  Call Dr Alberteen [facility] Address: [address] [place], [place], [place] [address]  Phone: [phone]   Referral to Rheumatologist - Dr Gavin Potters to follow-up Gout and Joint Pain  [facility] [address] [place], [place] [address] Hours: M-Th 8-5pm / F 8-12noon Phone: [phone]  Referral sent to Vascular - call them within a week if you have not heard back to schedule your appointment  [name] [address] [place], [place] [address]  Main: [phone]    Please schedule a Follow-up Appointment to: Return in about 3 months (around 09/29/2017) for Diabetes A1c, Neuropathy / Follow-up Vascular/Rheum.  If you have any other questions or concerns, please feel free to call the office or send a message through MyChart. You may also schedule an earlier appointment if necessary.  Additionally, you may be receiving a survey about your experience at our office within a few days to 1 week by e-mail or mail. We value your feedback.  Saralyn Pilar, DO [facility], [place]

## 2017-06-29 NOTE — Progress Notes (Signed)
Subjective:    Patient ID: [name], male    DOB: [birth year], 83 y.o.   MRN: [id number]  [name] is a 83 y.o. male presenting on 06/29/2017 for Hospitalization Follow-up (burning, feels cold in feet, ache or shooting pain) and Back Pain   HPI   HOSPITAL FOLLOW-UP VISIT  Hospital/Location: [facility] Date of Admission: 06/01/17 Date of Discharge: 06/04/17 Transitions of care telephone call: not completed  Reason for Admission: Acute on chronic joint pain, inflammatory arthritis  FOLLOW-UP  - Hospital H&P and Discharge Summary have been reviewed - Patient presents today about 25 days after recent hospitalization. Brief summary of recent course prior to hospitalization, patient was seen in ED on 05/24/17 for chronic bilateral lower extremity pain and swelling, ultimately he had ultrasound of lower ext showed extension or progression of known chronic RLE DVT, also dx with cellulitis of foot, previously on eliquis but he was non compliant with this, he was restarted on eliquis and given keflex antibiotic, and referred to Vascular Dr Wyn Quaker ([facility] - note he was previously referred back in 11/2016 by prior PCP but apt was cancelled due to transportation), then he returned to ED on 4/17 due to worsening joint pain multiple areas, R knee acute pain, neuropathy feet painful, cannot walk, consult to Rheumatology Dr Gavin Potters, given prednisone and IV steroids, had extensive lab work-up, also w/ systolic CHF given diuresis with LE edema, dx with inflammatory arthritis vs gout and given allopurinol due to hyperuricemia, switched to PO prednisone taper, advised outpatient rheumatology but patient refused, he was discharged to SNF for PT to [facility] since he was unable to take care of himself on discharge.  - Today reports overall has not done well after discharge. Symptoms of lower extremity edema and pain and burning have persisted. He states was at SNF for 9 days, he had  complaints about the food and describes in lot of detail about the food, and eventually he said he "called the police" and they "took him home" - Finished keflex - He is asking about pain and swelling, similar topics to previous discussion and other providers have discussed with him. He does not recall referral to Vascular, despite 2 previous referrals placed. - He is not familiar with his medicines due to literacy issue and cannot tell me what he is taking but knows generally what type of meds he takes.  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  Denies any fever chills, drainage of pus or fluid from leg, chest pain, dyspnea   Depression screen [facility] 2/9 04/28/2017 03/10/2017 11/24/2016  Decreased Interest 1 0 0  Down, Depressed, Hopeless 1 0 0  PHQ - 2 Score 2 0 0  Altered sleeping 2 - -  Tired, decreased energy 1 - -  Change in appetite 2 - -  Feeling bad or failure about yourself  3 - -  Trouble concentrating 0 - -  Moving slowly or fidgety/restless 0 - -  Suicidal thoughts 3 - -  PHQ-9 Score 13 - -  Difficult doing work/chores Not difficult at all - -    Social History   Tobacco Use  . Smoking status: Former Smoker  Packs/day: 0.25    Years: 10.00    Pack years: 2.50    Types: Cigarettes, Cigars    Last attempt to quit: 10/18/1979    Years since quitting: 37.7  . Smokeless tobacco: Never Used  Substance Use Topics  . Alcohol use: No  . Drug use: No    Review of Systems Per HPI unless specifically indicated above     Objective:    BP (!) 120/54   Pulse 62   Temp (!) 97.5 F (36.4 C) (Oral)   Resp 16   Ht 6' (1.829 m)   Wt 216 lb (98 kg)   BMI 29.29 kg/m   Wt  Readings from Last 3 Encounters:  06/29/17 216 lb (98 kg)  06/01/17 202 lb (91.6 kg)  05/24/17 202 lb (91.6 kg)    Physical Exam  Constitutional: He is oriented to person, place, and time. He appears well-developed and well-nourished. No distress.  Chronically ill appearing 82 yr old male, slightly uncomfortable with neuropathy, cooperative  HENT:  Head: Normocephalic and atraumatic.  Mouth/Throat: Oropharynx is clear and moist.  Eyes: Conjunctivae are normal. Right eye exhibits no discharge. Left eye exhibits no discharge.  Cardiovascular: Normal rate, regular rhythm and normal heart sounds.  No murmur heard. Reduced distal pulses  Pulmonary/Chest: Effort normal. No respiratory distress. He has no wheezes. He has no rales.  Mild diffuse reduced air movement, no focal crackles or wheezing  Musculoskeletal: He exhibits edema (Dramatic worsening bilateral lower extremity +3 pitting edema with firm tense edema into feet).  Neurological: He is alert and oriented to person, place, and time.  Reduced sensation to light touch feet and hands  L hand finger missing distal portion of phalanx, chronic old injury healed  Skin: Skin is warm and dry. No rash noted. He is not diaphoretic. No erythema.  Darkening of skin on dorsal left foot, venous stasis changes. Left foot plantar aspect with blistering around toes forefoot. Without open ulceration.  Psychiatric: He has a normal mood and affect. His behavior is normal.  Well groomed, good eye contact, speech is normal but abnormal with nearly non stop thoughts, follows appropriate train of thought but sometimes goes off on tangents, difficulty focusing on asked questions. Seems mildly anxious.  Nursing note and vitals reviewed.    I have personally reviewed the radiology report from 05/24/17 Vascular Ultrasound.  CLINICAL DATA:  Pain and swelling with history of prior DVT  EXAM: RIGHT LOWER EXTREMITY VENOUS DOPPLER  ULTRASOUND  TECHNIQUE: Gray-scale sonography with graded compression, as well as color Doppler and duplex ultrasound were performed to evaluate the lower extremity deep venous systems from the level of the common femoral vein and including the common femoral, femoral, profunda femoral, popliteal and calf veins including the posterior tibial, peroneal and gastrocnemius veins when visible. The superficial great saphenous vein was also interrogated. Spectral Doppler was utilized to evaluate flow at rest and with distal augmentation maneuvers in the common femoral, femoral and popliteal veins.  COMPARISON:  02/27/2017  FINDINGS: Contralateral Common Femoral Vein: Respiratory phasicity is normal and symmetric with the symptomatic side. No evidence of thrombus. Normal compressibility.  Common Femoral Vein: No evidence of thrombus. Normal compressibility, respiratory phasicity and response to augmentation.  Saphenofemoral Junction: No evidence of thrombus. Normal compressibility and flow on color Doppler imaging.  Profunda Femoral Vein: No evidence of thrombus. Normal compressibility and flow on color Doppler imaging.  Femoral Vein: Nonocclusive thrombus is noted with decreased compressibility.  Popliteal Vein: Nonocclusive thrombus  is noted with decreased compressibility.  Calf Veins: Not well visualized due to overlying edema. Some flow is noted within the posterior tibial veins  Superficial Great Saphenous Vein: No evidence of thrombus. Normal compressibility.  Venous Reflux:  None.  Other Findings:  Diffuse calf edema  IMPRESSION: Nonocclusive thrombus is noted within the superficial femoral and popliteal vein on the right. This likely represents some progression from the prior exam with some degree of recanalization.   Electronically Signed   By: Alcide Clever M.D.   On: 05/24/2017  16:47  -------------------------------------------------------------------------------------------   I have personally reviewed the radiology report from 06/01/17 Hip X-ray.  CLINICAL DATA:  Pt reports that he fell on his right side approx a month ago, but stated that he did not have any recent injury. Pt says that he was sitting in his chair today when his right hip and knee randomly started hurting. Pt leg appears .*comment was truncated*  EXAM: DG HIP (WITH OR WITHOUT PELVIS) 2-3V RIGHT  COMPARISON:  None.  FINDINGS: Hips are located. No evidence of pelvic fracture or sacral fracture. Dedicated view of the RIGHT hip demonstrates no femoral neck fracture.  IMPRESSION: No significant arthropathy or trauma the pelvis or RIGHT hip.   Electronically Signed   By: Genevive Bi M.D.   On: 06/01/2017 19:56  Results for orders placed or performed during the hospital encounter of 06/01/17  ANA w/Reflex if Positive  Result Value Ref Range   Anit Nuclear Antibody(ANA) Positive (A) Negative  Sedimentation rate  Result Value Ref Range   Sed Rate 52 (H) 0 - 20 mm/hr  C-reactive protein  Result Value Ref Range   CRP 1.3 (H) <1.0 mg/dL  Rheumatoid factor  Result Value Ref Range   Rhuematoid fact SerPl-aCnc <10.0 0.0 - 13.9 IU/mL  Basic metabolic panel  Result Value Ref Range   Sodium 136 135 - 145 mmol/L   Potassium 3.3 (L) 3.5 - 5.1 mmol/L   Chloride 108 101 - 111 mmol/L   CO2 23 22 - 32 mmol/L   Glucose, Bld 124 (H) 65 - 99 mg/dL   BUN 15 6 - 20 mg/dL   Creatinine, Ser 4.69 0.61 - 1.24 mg/dL   Calcium 8.6 (L) 8.9 - 10.3 mg/dL   GFR calc non Af Amer >60 >60 mL/min   GFR calc Af Amer >60 >60 mL/min   Anion gap 5 5 - 15  CBC  Result Value Ref Range   WBC 6.2 3.8 - 10.6 K/uL   RBC 3.86 (L) 4.40 - 5.90 MIL/uL   Hemoglobin 12.9 (L) 13.0 - 18.0 g/dL   HCT 62.9 (L) 52.8 - 41.3 %   MCV 95.3 80.0 - 100.0 fL   MCH 33.5 26.0 - 34.0 pg   MCHC 35.1 32.0 - 36.0 g/dL    RDW 24.4 01.0 - 27.2 %   Platelets 219 150 - 440 K/uL  Uric acid  Result Value Ref Range   Uric Acid, Serum 9.8 (H) 4.4 - 7.6 mg/dL  Hepatic function panel  Result Value Ref Range   Total Protein 7.5 6.5 - 8.1 g/dL   Albumin 3.4 (L) 3.5 - 5.0 g/dL   AST 31 15 - 41 U/L   ALT 12 (L) 17 - 63 U/L   Alkaline Phosphatase 87 38 - 126 U/L   Total Bilirubin 0.4 0.3 - 1.2 mg/dL   Bilirubin, Direct <5.3 (L) 0.1 - 0.5 mg/dL   Indirect Bilirubin NOT CALCULATED 0.3 - 0.9 mg/dL  ENA+DNA/DS+ANTICH+CENTRO+JO.Marland KitchenMarland Kitchen  Result Value Ref Range   ds DNA Ab 1 0 - 9 IU/mL   Ribonucleic Protein <0.2 0.0 - 0.9 AI   ENA SM Ab Ser-aCnc <0.2 0.0 - 0.9 AI   Scleroderma (Scl-70) (ENA) Antibody, IgG <0.2 0.0 - 0.9 AI   SSA (Ro) (ENA) Antibody, IgG 0.3 0.0 - 0.9 AI   SSB (La) (ENA) Antibody, IgG <0.2 0.0 - 0.9 AI   Chromatin Ab SerPl-aCnc 0.2 0.0 - 0.9 AI   Anti JO-1 <0.2 0.0 - 0.9 AI   Centromere Ab Screen >8.0 (H) 0.0 - 0.9 AI   See below: Comment       Assessment & Plan:   Problem List Items Addressed This Visit    Bilateral lower extremity edema Severe edema, secondary to multifactorial w/ history of DVT venous stasis, PAD, and likely some aspect of CHF On Lasix  - anticipate needs higher dose among other therapy, not on compression and not elevating legs. Given DVT - emphasized needs to stay on Eliquis  BID - refilled this Review RICE therapy  Referral to [facility] - as previously recommended by ED about 1 month ago and by prior PCP 11/2016 - he will likely need Unna boots among other aggressive edema therapy, ultimately may need higher dose diuretic.     Peripheral arterial occlusive disease (HCC) Likely factor for chronic leg pain, has chronic DVT and venous stasis contributing On Eliquis Referral to Vascular    Relevant Medications   apixaban (ELIQUIS) 5 MG TABS tablet   Peripheral neuropathy Primary etiology of chronic pain, with known DM and severe edema bilateral On  high dose Gabapentin Trial on Tramadol uncertain if improved  Has seen Neurology before - goal to control DM / improve edema - ultimately may need nerve study    Post-traumatic osteoarthritis of multiple joints Chronic injuries and OA/DJD - however recent episode with concern possible RA vs inflammatory arthritis, multiple joint pain - had labs with abnormal ANA, centromere ab, other labs unremarkable  Referral to [facility] for further management and assistance with chronic joint pain    Right leg DVT (HCC) - Primary Refilled chronic anticoagulation - prior non adherence Continue Eliquis  BID   Relevant Medications   apixaban (ELIQUIS) 5 MG TABS tablet   Type 2 diabetes, controlled, with neuropathy (HCC) (Chronic) Needs to return for dedicated DM2 visit Concern w/ neuropathy and edema - see above      Orders Placed This Encounter  Procedures  . Ambulatory referral to Vascular Surgery    Referral Priority:   Routine    Referral Type:   Surgical    Referral Reason:   Specialty Services Required    Referred to Provider:   Annice Needy, MD    Requested Specialty:   Vascular Surgery    Number of Visits Requested:   1  . Ambulatory referral to Rheumatology    Referral Priority:   Routine    Referral Type:   Consultation    Referral Reason:   Specialty Services Required    Referred to Provider:   Kandyce Rud., MD    Requested Specialty:   Rheumatology    Number of Visits Requested:   1     Current Outpatient Medications:  .  acetaminophen (TYLENOL) 500 MG tablet, Take 500 mg by mouth every 8 (eight) hours as needed for mild pain or fever. , Disp: , Rfl:  .  allopurinol (ZYLOPRIM) 100 MG tablet, Take 1 tablet (  100 mg total) by mouth daily., Disp: , Rfl:  .  apixaban (ELIQUIS) 5 MG TABS tablet, Take 1 tablet (5 mg total) by mouth 2 (two) times daily., Disp: 60 tablet, Rfl: 2 .  aspirin EC 81 MG tablet, Take 1 tablet (81 mg total) by mouth daily., Disp: 30 tablet,  Rfl: 11 .  atorvastatin (LIPITOR) 40 MG tablet, Take 1 tablet (40 mg total) by mouth at bedtime., Disp: 90 tablet, Rfl: 1 .  bisoprolol (ZEBETA) 5 MG tablet, Take 1 tablet (5 mg total) by mouth daily., Disp: , Rfl:  .  Elastic Bandages & Supports (TRUFORM STOCKINGS 20-30MMHG) MISC, Wear on both legs; put on first thing in the morning and remove before bed; do NOT sleep in them, Disp: 2 each, Rfl: 0 .  Elastic Bandages & Supports (WRIST BRACE/RIGHT LARGE) MISC, Wear on the right wrist, cock-up wrist splint for carpal tunnel, Disp: 1 each, Rfl: 0 .  furosemide (LASIX) 20 MG tablet, Take 1 tablet (20 mg total) by mouth daily., Disp: 30 tablet, Rfl: 2 .  ibuprofen (ADVIL,MOTRIN) 200 MG tablet, Take 200 mg by mouth every 6 (six) hours as needed for mild pain. Pt takes twice a day, am/pm , Disp: , Rfl:  .  mupirocin cream (BACTROBAN) 2 %, Apply topically daily., Disp: 15 g, Rfl: 0 .  ondansetron (ZOFRAN) 4 MG tablet, Take 1 tablet (4 mg total) by mouth every 6 (six) hours as needed for nausea., Disp: 20 tablet, Rfl: 0 .  pantoprazole (PROTONIX) 40 MG tablet, Take 1 tablet (40 mg total) by mouth daily., Disp: 30 tablet, Rfl: 2 .  potassium chloride (K-DUR) 10 MEQ tablet, Take 1 tablet (10 mEq total) by mouth daily., Disp: 30 tablet, Rfl: 2 .  predniSONE (STERAPRED UNI-PAK 21 TAB) 10 MG (21) TBPK tablet, Take 1 tablet (10 mg total) by mouth daily. Take  4 tablets by mouth for 1 day followed by  3 tablets by mouth for 1 day followed by  2 tablets by mouth for 1 day followed by  1 tablet by mouth for a day and stop, Disp: 11 tablet, Rfl: 0 .  traMADol (ULTRAM) 50 MG tablet, Take 1 tablet (50 mg total) by mouth every 6 (six) hours as needed for moderate pain or severe pain., Disp: 20 tablet, Rfl: 0 .  gabapentin (NEURONTIN) 300 MG capsule, Take 2 capsules (600 mg total) by mouth 3 (three) times daily. Take 1 pill AM, Take 1 pill Mid-day, Take 2 pills PM x 3 days Take 1 pill AM, Take 2 pills Mid-day, Take 2 pills  PM x 3 days Take 2 pills AM, Take 2 pills Mid-day, Take 2 pills PM, Disp: 90 capsule, Rfl: 1   Meds ordered this encounter  Medications  . apixaban (ELIQUIS) 5 MG TABS tablet    Sig: Take 1 tablet (5 mg total) by mouth 2 (two) times daily.    Dispense:  60 tablet    Refill:  2      Follow up plan: Return in about 3 months (around 09/29/2017) for Diabetes A1c, Neuropathy / Follow-up Vascular/Rheum.  Saralyn Pilar, DO [facility] 06/29/2017, 11:00 PM

## 2017-07-06 ENCOUNTER — Other Ambulatory Visit: Payer: Self-pay

## 2017-07-06 ENCOUNTER — Encounter: Payer: Self-pay | Admitting: Emergency Medicine

## 2017-07-06 ENCOUNTER — Inpatient Hospital Stay
Admission: EM | Admit: 2017-07-06 | Discharge: 2017-07-13 | DRG: 623 | Disposition: A | Discharge status: Home Health | Payer: Medicare Other | Attending: Internal Medicine | Admitting: Internal Medicine

## 2017-07-06 DIAGNOSIS — F329 Major depressive disorder, single episode, unspecified: Secondary | ICD-10-CM | POA: Diagnosis present

## 2017-07-06 DIAGNOSIS — Z7901 Long term (current) use of anticoagulants: Secondary | ICD-10-CM

## 2017-07-06 DIAGNOSIS — I10 Essential (primary) hypertension: Secondary | ICD-10-CM | POA: Diagnosis not present

## 2017-07-06 DIAGNOSIS — Z8249 Family history of ischemic heart disease and other diseases of the circulatory system: Secondary | ICD-10-CM

## 2017-07-06 DIAGNOSIS — E11621 Type 2 diabetes mellitus with foot ulcer: Secondary | ICD-10-CM | POA: Diagnosis present

## 2017-07-06 DIAGNOSIS — K219 Gastro-esophageal reflux disease without esophagitis: Secondary | ICD-10-CM | POA: Diagnosis present

## 2017-07-06 DIAGNOSIS — F0151 Vascular dementia with behavioral disturbance: Secondary | ICD-10-CM | POA: Diagnosis not present

## 2017-07-06 DIAGNOSIS — Z951 Presence of aortocoronary bypass graft: Secondary | ICD-10-CM | POA: Diagnosis not present

## 2017-07-06 DIAGNOSIS — X58XXXS Exposure to other specified factors, sequela: Secondary | ICD-10-CM

## 2017-07-06 DIAGNOSIS — L97519 Non-pressure chronic ulcer of other part of right foot with unspecified severity: Secondary | ICD-10-CM | POA: Diagnosis present

## 2017-07-06 DIAGNOSIS — S069X9S Unspecified intracranial injury with loss of consciousness of unspecified duration, sequela: Secondary | ICD-10-CM

## 2017-07-06 DIAGNOSIS — Z86711 Personal history of pulmonary embolism: Secondary | ICD-10-CM

## 2017-07-06 DIAGNOSIS — E785 Hyperlipidemia, unspecified: Secondary | ICD-10-CM | POA: Diagnosis present

## 2017-07-06 DIAGNOSIS — I5022 Chronic systolic (congestive) heart failure: Secondary | ICD-10-CM | POA: Diagnosis present

## 2017-07-06 DIAGNOSIS — Z885 Allergy status to narcotic agent status: Secondary | ICD-10-CM

## 2017-07-06 DIAGNOSIS — L03116 Cellulitis of left lower limb: Secondary | ICD-10-CM | POA: Diagnosis present

## 2017-07-06 DIAGNOSIS — F0281 Dementia in other diseases classified elsewhere with behavioral disturbance: Secondary | ICD-10-CM

## 2017-07-06 DIAGNOSIS — J449 Chronic obstructive pulmonary disease, unspecified: Secondary | ICD-10-CM | POA: Diagnosis present

## 2017-07-06 DIAGNOSIS — M1990 Unspecified osteoarthritis, unspecified site: Secondary | ICD-10-CM | POA: Diagnosis present

## 2017-07-06 DIAGNOSIS — R110 Nausea: Secondary | ICD-10-CM | POA: Diagnosis not present

## 2017-07-06 DIAGNOSIS — L03119 Cellulitis of unspecified part of limb: Secondary | ICD-10-CM | POA: Diagnosis present

## 2017-07-06 DIAGNOSIS — M109 Gout, unspecified: Secondary | ICD-10-CM | POA: Diagnosis present

## 2017-07-06 DIAGNOSIS — Z7982 Long term (current) use of aspirin: Secondary | ICD-10-CM

## 2017-07-06 DIAGNOSIS — E1151 Type 2 diabetes mellitus with diabetic peripheral angiopathy without gangrene: Secondary | ICD-10-CM | POA: Diagnosis present

## 2017-07-06 DIAGNOSIS — Z79899 Other long term (current) drug therapy: Secondary | ICD-10-CM | POA: Diagnosis not present

## 2017-07-06 DIAGNOSIS — Z89022 Acquired absence of left finger(s): Secondary | ICD-10-CM

## 2017-07-06 DIAGNOSIS — E1142 Type 2 diabetes mellitus with diabetic polyneuropathy: Secondary | ICD-10-CM | POA: Diagnosis present

## 2017-07-06 DIAGNOSIS — E119 Type 2 diabetes mellitus without complications: Secondary | ICD-10-CM | POA: Diagnosis not present

## 2017-07-06 DIAGNOSIS — I70238 Atherosclerosis of native arteries of right leg with ulceration of other part of lower right leg: Secondary | ICD-10-CM | POA: Diagnosis not present

## 2017-07-06 DIAGNOSIS — R600 Localized edema: Secondary | ICD-10-CM | POA: Diagnosis not present

## 2017-07-06 DIAGNOSIS — L03115 Cellulitis of right lower limb: Secondary | ICD-10-CM | POA: Diagnosis present

## 2017-07-06 DIAGNOSIS — Z8739 Personal history of other diseases of the musculoskeletal system and connective tissue: Secondary | ICD-10-CM

## 2017-07-06 DIAGNOSIS — L97529 Non-pressure chronic ulcer of other part of left foot with unspecified severity: Secondary | ICD-10-CM | POA: Diagnosis present

## 2017-07-06 DIAGNOSIS — Z87891 Personal history of nicotine dependence: Secondary | ICD-10-CM

## 2017-07-06 DIAGNOSIS — I2510 Atherosclerotic heart disease of native coronary artery without angina pectoris: Secondary | ICD-10-CM | POA: Diagnosis present

## 2017-07-06 DIAGNOSIS — Z950 Presence of cardiac pacemaker: Secondary | ICD-10-CM | POA: Diagnosis not present

## 2017-07-06 DIAGNOSIS — I110 Hypertensive heart disease with heart failure: Secondary | ICD-10-CM | POA: Diagnosis present

## 2017-07-06 DIAGNOSIS — L97509 Non-pressure chronic ulcer of other part of unspecified foot with unspecified severity: Secondary | ICD-10-CM

## 2017-07-06 DIAGNOSIS — I70248 Atherosclerosis of native arteries of left leg with ulceration of other part of lower left leg: Secondary | ICD-10-CM | POA: Diagnosis not present

## 2017-07-06 LAB — CBC WITH DIFFERENTIAL/PLATELET
Basophils Absolute: 0 10*3/uL (ref 0–0.1)
Basophils Relative: 1 %
EOS ABS: 0.3 10*3/uL (ref 0–0.7)
EOS PCT: 4 %
HCT: 31.7 % — ABNORMAL LOW (ref 40.0–52.0)
HEMOGLOBIN: 11.3 g/dL — AB (ref 13.0–18.0)
LYMPHS ABS: 0.7 10*3/uL — AB (ref 1.0–3.6)
Lymphocytes Relative: 10 %
MCH: 33.2 pg (ref 26.0–34.0)
MCHC: 35.6 g/dL (ref 32.0–36.0)
MCV: 93.2 fL (ref 80.0–100.0)
Monocytes Absolute: 0.8 10*3/uL (ref 0.2–1.0)
Monocytes Relative: 11 %
Neutro Abs: 5.8 10*3/uL (ref 1.4–6.5)
Neutrophils Relative %: 74 %
PLATELETS: 329 10*3/uL (ref 150–440)
RBC: 3.41 MIL/uL — ABNORMAL LOW (ref 4.40–5.90)
RDW: 13.9 % (ref 11.5–14.5)
WBC: 7.7 10*3/uL (ref 3.8–10.6)

## 2017-07-06 LAB — GLUCOSE, CAPILLARY
Glucose-Capillary: 85 mg/dL (ref 65–99)
Glucose-Capillary: 114 mg/dL — ABNORMAL HIGH (ref 65–99)

## 2017-07-06 LAB — COMPREHENSIVE METABOLIC PANEL
ALT: 17 U/L (ref 17–63)
AST: 32 U/L (ref 15–41)
Albumin: 2.9 g/dL — ABNORMAL LOW (ref 3.5–5.0)
Alkaline Phosphatase: 72 U/L (ref 38–126)
Anion gap: 7 (ref 5–15)
BUN: 15 mg/dL (ref 6–20)
CHLORIDE: 107 mmol/L (ref 101–111)
CO2: 20 mmol/L — ABNORMAL LOW (ref 22–32)
CREATININE: 1.26 mg/dL — AB (ref 0.61–1.24)
Calcium: 8.7 mg/dL — ABNORMAL LOW (ref 8.9–10.3)
GFR, EST NON AFRICAN AMERICAN: 52 mL/min — AB (ref >60)
Glucose, Bld: 144 mg/dL — ABNORMAL HIGH (ref 65–99)
Potassium: 3.5 mmol/L (ref 3.5–5.1)
Sodium: 134 mmol/L — ABNORMAL LOW (ref 135–145)
TOTAL PROTEIN: 7.3 g/dL (ref 6.5–8.1)
Total Bilirubin: 0.6 mg/dL (ref 0.3–1.2)

## 2017-07-06 LAB — BRAIN NATRIURETIC PEPTIDE: B NATRIURETIC PEPTIDE 5: 259 pg/mL — AB (ref 0.0–100.0)

## 2017-07-06 LAB — HEMOGLOBIN A1C
HEMOGLOBIN A1C: 6.1 % — AB (ref 4.8–5.6)
MEAN PLASMA GLUCOSE: 128.37 mg/dL

## 2017-07-06 LAB — LACTIC ACID, PLASMA: Lactic Acid, Venous: 1.2 mmol/L (ref 0.5–1.9)

## 2017-07-06 MED ORDER — ALLOPURINOL 100 MG PO TABS
100.0000 mg | ORAL_TABLET | Freq: Every day | ORAL | Status: DC
  Administered 2017-07-07 – 2017-07-13 (×7): 100 mg via ORAL
  Filled 2017-07-06 (×9): qty 1

## 2017-07-06 MED ORDER — ONDANSETRON HCL 4 MG/2ML IJ SOLN: 4.0000 mg | Freq: Four times a day (QID) | INTRAMUSCULAR | Status: DC | PRN

## 2017-07-06 MED ORDER — GABAPENTIN 300 MG PO CAPS
600.0000 mg | ORAL_CAPSULE | Freq: Three times a day (TID) | ORAL | Status: DC
  Administered 2017-07-06 – 2017-07-13 (×20): 600 mg via ORAL
  Filled 2017-07-06 (×20): qty 2

## 2017-07-06 MED ORDER — ACETAMINOPHEN 325 MG PO TABS
650.0000 mg | ORAL_TABLET | Freq: Four times a day (QID) | ORAL | Status: DC | PRN
  Administered 2017-07-07 – 2017-07-08 (×3): 650 mg via ORAL
  Filled 2017-07-06 (×3): qty 2

## 2017-07-06 MED ORDER — APIXABAN 5 MG PO TABS
5.0000 mg | ORAL_TABLET | Freq: Two times a day (BID) | ORAL | Status: DC
  Administered 2017-07-06 – 2017-07-13 (×15): 5 mg via ORAL
  Filled 2017-07-06 (×15): qty 1

## 2017-07-06 MED ORDER — PANTOPRAZOLE SODIUM 40 MG PO TBEC
40.0000 mg | DELAYED_RELEASE_TABLET | Freq: Every day | ORAL | Status: DC
  Administered 2017-07-07 – 2017-07-13 (×7): 40 mg via ORAL
  Filled 2017-07-06 (×7): qty 1

## 2017-07-06 MED ORDER — VANCOMYCIN HCL IN DEXTROSE 1-5 GM/200ML-% IV SOLN
1000.0000 mg | Freq: Once | INTRAVENOUS | Status: AC
  Administered 2017-07-06: 1000 mg via INTRAVENOUS
  Filled 2017-07-06: qty 200

## 2017-07-06 MED ORDER — ASPIRIN EC 81 MG PO TBEC
81.0000 mg | DELAYED_RELEASE_TABLET | Freq: Every day | ORAL | Status: DC
  Administered 2017-07-07 – 2017-07-13 (×7): 81 mg via ORAL
  Filled 2017-07-06 (×7): qty 1

## 2017-07-06 MED ORDER — INSULIN ASPART 100 UNIT/ML ~~LOC~~ SOLN
0.0000 [IU] | Freq: Three times a day (TID) | SUBCUTANEOUS | Status: DC
  Administered 2017-07-08: 08:00:00 1 [IU] via SUBCUTANEOUS
  Filled 2017-07-06: qty 1

## 2017-07-06 MED ORDER — TRAMADOL HCL 50 MG PO TABS
50.0000 mg | ORAL_TABLET | Freq: Four times a day (QID) | ORAL | Status: DC | PRN
  Administered 2017-07-06 – 2017-07-13 (×11): 50 mg via ORAL
  Filled 2017-07-06 (×12): qty 1

## 2017-07-06 MED ORDER — PIPERACILLIN-TAZOBACTAM 3.375 G IVPB 30 MIN
3.3750 g | Freq: Once | INTRAVENOUS | Status: AC
  Administered 2017-07-06: 3.375 g via INTRAVENOUS
  Filled 2017-07-06: qty 50

## 2017-07-06 MED ORDER — FUROSEMIDE 20 MG PO TABS
20.0000 mg | ORAL_TABLET | Freq: Every day | ORAL | Status: DC
  Administered 2017-07-07 – 2017-07-13 (×7): 20 mg via ORAL
  Filled 2017-07-06 (×7): qty 1

## 2017-07-06 MED ORDER — ATORVASTATIN CALCIUM 20 MG PO TABS
40.0000 mg | ORAL_TABLET | Freq: Every day | ORAL | Status: DC
  Administered 2017-07-06 – 2017-07-12 (×7): 40 mg via ORAL
  Filled 2017-07-06 (×7): qty 2

## 2017-07-06 MED ORDER — ONDANSETRON HCL 4 MG/2ML IJ SOLN
4.0000 mg | Freq: Once | INTRAMUSCULAR | Status: AC
  Administered 2017-07-06: 4 mg via INTRAVENOUS
  Filled 2017-07-06: qty 2

## 2017-07-06 MED ORDER — ALBUTEROL SULFATE (2.5 MG/3ML) 0.083% IN NEBU: 2.5000 mg | INHALATION_SOLUTION | RESPIRATORY_TRACT | Status: DC | PRN

## 2017-07-06 MED ORDER — POLYETHYLENE GLYCOL 3350 17 G PO PACK: 17.0000 g | PACK | Freq: Every day | ORAL | Status: DC | PRN

## 2017-07-06 MED ORDER — POTASSIUM CHLORIDE CRYS ER 10 MEQ PO TBCR
10.0000 meq | EXTENDED_RELEASE_TABLET | Freq: Every day | ORAL | Status: DC
  Administered 2017-07-07 – 2017-07-13 (×7): 10 meq via ORAL
  Filled 2017-07-06 (×10): qty 1

## 2017-07-06 MED ORDER — SODIUM CHLORIDE 0.9 % IV SOLN
2.0000 g | Freq: Two times a day (BID) | INTRAVENOUS | Status: DC
  Administered 2017-07-06 – 2017-07-12 (×13): 2 g via INTRAVENOUS
  Filled 2017-07-06 (×17): qty 2

## 2017-07-06 MED ORDER — BISOPROLOL FUMARATE 5 MG PO TABS
5.0000 mg | ORAL_TABLET | Freq: Every day | ORAL | Status: DC
  Administered 2017-07-07 – 2017-07-13 (×7): 5 mg via ORAL
  Filled 2017-07-06 (×8): qty 1

## 2017-07-06 MED ORDER — ONDANSETRON HCL 4 MG PO TABS
4.0000 mg | ORAL_TABLET | Freq: Four times a day (QID) | ORAL | Status: DC | PRN
Start: 2017-07-06 — End: 2017-07-14

## 2017-07-06 MED ORDER — VANCOMYCIN HCL 10 G IV SOLR
1500.0000 mg | INTRAVENOUS | Status: DC
  Administered 2017-07-06 – 2017-07-09 (×4): 1500 mg via INTRAVENOUS
  Filled 2017-07-06 (×6): qty 1500

## 2017-07-06 MED ORDER — ACETAMINOPHEN 650 MG RE SUPP: 650.0000 mg | Freq: Four times a day (QID) | RECTAL | Status: DC | PRN

## 2017-07-06 MED ORDER — INSULIN ASPART 100 UNIT/ML ~~LOC~~ SOLN: 0.0000 [IU] | Freq: Every day | SUBCUTANEOUS | Status: DC

## 2017-07-06 MED ORDER — MORPHINE SULFATE (PF) 4 MG/ML IV SOLN
4.0000 mg | Freq: Once | INTRAVENOUS | Status: AC
  Administered 2017-07-06: 4 mg via INTRAVENOUS
  Filled 2017-07-06: qty 1

## 2017-07-06 NOTE — ED Provider Notes (Signed)
[facility] Emergency Department Provider Note  ____________________________________________  Time seen: Approximately 9:10 AM  I have reviewed the triage vital signs and the nursing notes.   HISTORY  Chief Complaint Leg Swelling   HPI SIRAJ DERMODY is a 83 y.o. male with a history of peripheral neuropathy, venous stasis, DVT on Eliquis, CHF, diabetes, CAD, hypertension, hyperlipidemia who presents for evaluation of bilateral leg pain.  He reports that the pain has been going on for a week.  The pain is burning and throbbing, severe, nonradiating.  He reports that he is noted redness on both of his legs that started yesterday and is rapidly ascending up his thigh.  He reports subjective fever and chills.  No nausea or vomiting.  No chest pain or shortness of breath.   Past Medical History:  Diagnosis Date  . Arthritis   . CAD (coronary artery disease)   . Congestive heart failure (CHF) (HCC) 06/14/2016   LVEF 40-45% 2016 echo; Dr. Darrold Junker  . COPD (chronic obstructive pulmonary disease) (HCC)   . Dementia   . Depression   . Diabetes mellitus without complication (HCC)   . Family history of adverse reaction to anesthesia    mom seizures   . GERD (gastroesophageal reflux disease)   . Heart disease   . History of blood clots in legs   . History of ischemic cardiomyopathy   . 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 Hyperlipidemia   . Hypertension   . Literacy level of illiterate   . Osteomyelitis (HCC)   . PN (peripheral neuropathy)   . Psoriasis     Patient Active Problem List   Diagnosis Date Noted  . Right knee pain 06/02/2017  . Post-traumatic osteoarthritis of multiple joints 04/28/2017  . Abnormality of plasma protein 03/25/2017  . Elevated beta-2 microglobulin 03/25/2017  . Right leg DVT (HCC) 03/15/2017  . GERD (gastroesophageal reflux disease) 03/10/2017  . Hyperlipidemia, unspecified 03/10/2017  . Peripheral arterial occlusive disease  (HCC) 11/24/2016  . Bilateral lower extremity edema 10/19/2016  . Congestive heart failure (CHF) (HCC) 06/14/2016  . Medication monitoring encounter 06/14/2016  . Hyperthyroidism 12/04/2015  . Unsteady gait 12/04/2015  . 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 Abnormal serum protein electrophoresis 12/05/2014  . Pressure ulcer 11/15/2014  . Hyperproteinemia 11/02/2014  . Hypoalbuminemia 11/02/2014  . Arthritis 10/23/2014  . Arteriosclerosis of coronary artery 10/23/2014  . Type 2 diabetes, controlled, with neuropathy (HCC) 10/23/2014  . Amputation of finger of left hand 10/23/2014  . History of pulmonary embolism 10/23/2014  . Literacy problem 10/23/2014  . Umbilical hernia without obstruction or gangrene 10/23/2014  . Peripheral neuropathy 10/23/2014  . History of osteomyelitis 10/23/2014  . Artificial cardiac pacemaker 08/10/2013  . H/O coronary artery bypass surgery 08/10/2013  . GIB (gastrointestinal bleeding) 07/18/2013  . Benign prostatic hyperplasia with urinary obstruction 09/27/2012  . Abnormal prostate specific antigen 09/27/2012  . Benign localized hyperplasia of prostate with urinary obstruction and other lower urinary tract symptoms (LUTS)(600.21) 09/27/2012  . Cardiomyopathy (HCC) 08/09/2011    Past Surgical History:  Procedure Laterality Date  . amputation 4th finger Left   . APPENDECTOMY    . CLAVICLE SURGERY  2012   open reduction and internal fixation of left clavicle  . CORONARY ARTERY BYPASS GRAFT  2002  . FOOT SURGERY    . IMPLANTABLE CARDIOVERTER DEFIBRILLATOR (ICD) GENERATOR CHANGE N/A 12/03/2015   Procedure: ICD GENERATOR  CHANGE;  Surgeon: Marcina Millard, MD;  Location: [facility] ORS;  Service: Cardiovascular;  Laterality: N/A;  . PACEMAKER INSERTION      Prior to Admission medications   Medication Sig Start Date End Date Taking?  Authorizing Provider  acetaminophen (TYLENOL) 500 MG tablet Take 500 mg by mouth every 8 (eight) hours as needed for mild pain or fever.     [provider]  allopurinol (ZYLOPRIM) 100 MG tablet Take 1 tablet (100 mg total) by mouth daily. 06/05/17   Ramonita Lab, MD  apixaban (ELIQUIS) 5 MG TABS tablet Take 1 tablet (5 mg total) by mouth 2 (two) times daily. 06/29/17   Karamalegos, Netta Neat, DO  aspirin EC 81 MG tablet Take 1 tablet (81 mg total) by mouth daily. 05/11/16   Kerman Passey, MD  atorvastatin (LIPITOR) 40 MG tablet Take 1 tablet (40 mg total) by mouth at bedtime. 12/17/16   Kerman Passey, MD  bisoprolol (ZEBETA) 5 MG tablet Take 1 tablet (5 mg total) by mouth daily. 06/05/17   Ramonita Lab, MD  Elastic Bandages & Supports (TRUFORM STOCKINGS 20-30MMHG) MISC Wear on both legs; put on first thing in the morning and remove before bed; do NOT sleep in them 03/24/17   [name], Janit Bern, MD  Elastic Bandages & Supports (WRIST BRACE/RIGHT LARGE) MISC Wear on the right wrist, cock-up wrist splint for carpal tunnel 03/24/17   Kerman Passey, MD  furosemide (LASIX) 20 MG tablet Take 1 tablet (20 mg total) by mouth daily. 04/28/17   Karamalegos, Netta Neat, DO  gabapentin (NEURONTIN) 300 MG capsule Take 2 capsules (600 mg total) by mouth 3 (three) times daily. Take 1 pill AM, Take 1 pill Mid-day, Take 2 pills PM x 3 days Take 1 pill AM, Take 2 pills Mid-day, Take 2 pills PM x 3 days Take 2 pills AM, Take 2 pills Mid-day, Take 2 pills PM 05/11/17 06/10/17  Menshew, Charlesetta Ivory, PA-C  ibuprofen (ADVIL,MOTRIN) 200 MG tablet Take 200 mg by mouth every 6 (six) hours as needed for mild pain. Pt takes twice a day, am/pm     [provider]  mupirocin cream (BACTROBAN) 2 % Apply topically daily. 06/05/17   Gouru, Deanna Artis, MD  ondansetron (ZOFRAN) 4 MG tablet Take 1 tablet (4 mg total) by mouth every 6 (six) hours as needed for nausea. 06/04/17   Gouru, Deanna Artis, MD  pantoprazole (PROTONIX) 40 MG  tablet Take 1 tablet (40 mg total) by mouth daily. 04/28/17   Karamalegos, Netta Neat, DO  potassium chloride (K-DUR) 10 MEQ tablet Take 1 tablet (10 mEq total) by mouth daily. 04/28/17   Karamalegos, Netta Neat, DO  predniSONE (STERAPRED UNI-PAK 21 TAB) 10 MG (21) TBPK tablet Take 1 tablet (10 mg total) by mouth daily. Take  4 tablets by mouth for 1 day followed by  3 tablets by mouth for 1 day followed by  2 tablets by mouth for 1 day followed by  1 tablet by mouth for a day and stop 06/04/17   Ramonita Lab, MD  traMADol (ULTRAM) 50 MG tablet Take 1 tablet (50 mg total) by mouth every 6 (six) hours as needed for moderate pain or severe pain. 06/04/17   Ramonita Lab, MD    Allergies Oxycodone-acetaminophen and Percocet [oxycodone-acetaminophen]  Family History  Problem Relation Age of Onset  . Cancer Mother        spine  . Hypertension Mother   . Mental illness Mother   . Cancer  Father        lung  . Asthma Father   . Allergic rhinitis Father   . Arthritis Father   . Tuberculosis Father   . Heart disease Father   . Gout Brother   . Allergic rhinitis Brother     Social History Social History   Tobacco Use  . Smoking status: Former Smoker    Packs/day: 0.25    Years: 10.00    Pack years: 2.50    Types: Cigarettes, Cigars    Last attempt to quit: 10/18/1979    Years since quitting: 37.7  . Smokeless tobacco: Never Used  Substance Use Topics  . Alcohol use: No  . Drug use: No    Review of Systems  Constitutional: Negative for fever. Eyes: Negative for visual changes. ENT: Negative for sore throat. Neck: No neck pain  Cardiovascular: Negative for chest pain. Respiratory: Negative for shortness of breath. Gastrointestinal: Negative for abdominal pain, vomiting or diarrhea. Genitourinary: Negative for dysuria. Musculoskeletal: Negative for back pain. + b/l leg pain Skin: Negative for rash. Neurological: Negative for headaches, weakness or numbness. Psych: No SI or  HI  ____________________________________________   PHYSICAL EXAM:  VITAL SIGNS: ED Triage Vitals  Enc Vitals Group     BP 07/06/17 0812 (!) 138/54     Pulse Rate 07/06/17 0812 69     Resp 07/06/17 0812 16     Temp 07/06/17 0812 100.3 F (37.9 C)     Temp Source 07/06/17 0812 Oral     SpO2 07/06/17 0812 99 %     Weight 07/06/17 0814 216 lb (98 kg)     Height 07/06/17 0814 6' (1.829 m)     Head Circumference --      Peak Flow --      Pain Score 07/06/17 0813 10     Pain Loc --      Pain Edu? --      Excl. in GC? --     Constitutional: Alert and oriented. Well appearing and in no apparent distress. HEENT:      Head: Normocephalic and atraumatic.         Eyes: Conjunctivae are normal. Sclera is non-icteric.       Mouth/Throat: Mucous membranes are moist.       Neck: Supple with no signs of meningismus. Cardiovascular: Regular rate and rhythm. No murmurs, gallops, or rubs. 2+ symmetrical distal pulses are present in all extremities. No JVD. Respiratory: Normal respiratory effort. Lungs are clear to auscultation bilaterally. No wheezes, crackles, or rhonchi.  Gastrointestinal: Soft, non tender, and non distended with positive bowel sounds. No rebound or guarding. Musculoskeletal: 3+ pitting edema bilateral lower extremities which feel warm to the touch with normal brisk capillary refill and erythema that starts on bilateral feet and is now streaking all the way up to his inner thighs, no crepitus or bulla. There are several ulcerations on bilateral feet with no pus   Neurologic: Normal speech and language. Face is symmetric. Moving all extremities. No gross focal neurologic deficits are appreciated. Skin: Skin is warm, dry and intact. No rash noted. Psychiatric: Mood and affect are normal. Speech and behavior are normal.  ____________________________________________   LABS (all labs ordered are listed, but only abnormal results are displayed)  Labs Reviewed  CBC WITH  DIFFERENTIAL/PLATELET - Abnormal; Notable for the following components:      Result Value   RBC 3.41 (*)    Hemoglobin 11.3 (*)    HCT 31.7 (*)  Lymphs Abs 0.7 (*)    All other components within normal limits  BRAIN NATRIURETIC PEPTIDE - Abnormal; Notable for the following components:   B Natriuretic Peptide 259.0 (*)    All other components within normal limits  COMPREHENSIVE METABOLIC PANEL - Abnormal; Notable for the following components:   Sodium 134 (*)    CO2 20 (*)    Glucose, Bld 144 (*)    Creatinine, Ser 1.26 (*)    Calcium 8.7 (*)    Albumin 2.9 (*)    GFR calc non Af Amer 52 (*)    All other components within normal limits  CULTURE, BLOOD (ROUTINE X 2)  CULTURE, BLOOD (ROUTINE X 2)  LACTIC ACID, PLASMA   ____________________________________________  EKG  none ____________________________________________  RADIOLOGY  none  ____________________________________________   PROCEDURES  Procedure(s) performed: None Procedures Critical Care performed: none ____________________________________________   INITIAL IMPRESSION / ASSESSMENT AND PLAN / ED COURSE   83 y.o. male with a history of peripheral neuropathy, venous stasis, DVT on Eliquis, CHF, diabetes, CAD, hypertension, hyperlipidemia who presents for evaluation of bilateral leg pain, swelling and erythema.  Clinically presentation is concerning for cellulitis.  Patient has a temp of 100.42F, due to significant comorbidities including PVD/ venous stasis, and poor management of his own health I believe patient needs admission for IV antibiotics and a wound care evaluation.  Patient does not meet sepsis criteria at this time.  Has normal white count.  Lactic and blood cultures are sent.  Will treat with Zosyn and Vancomycin and admit to the hospitalist. Patient is on Eliquis therefore low suspicion for DVT.      As part of my medical decision making, I reviewed the following data within the electronic medical  record:  Nursing notes reviewed and incorporated, Labs reviewed , Old chart reviewed, Discussed with admitting physician , Notes from prior ED visits and [place] Controlled Substance Database    Pertinent labs & imaging results that were available during my care of the patient were reviewed by me and considered in my medical decision making (see chart for details).    ____________________________________________   FINAL CLINICAL IMPRESSION(S) / ED DIAGNOSES  Final diagnoses:  Cellulitis of lower extremity, unspecified laterality      NEW MEDICATIONS STARTED DURING THIS VISIT:  ED Discharge Orders    None       Note:  This document was prepared using Dragon voice recognition software and may include unintentional dictation errors.    Don Perking, [place], MD 07/06/17 [phone]

## 2017-07-06 NOTE — ED Notes (Signed)
Crystal, EDT called to transport pt to the floor

## 2017-07-06 NOTE — Progress Notes (Signed)
Pharmacy Antibiotic Note  [name] is a 83 y.o. male admitted on 07/06/2017 with cellulitis.  Pharmacy has been consulted for Vancomycin and Cefepime dosing.  Plan: Vancomycin  IV every 18 hours.  Goal trough 15-20 mcg/mL. Cefepime 2g IV q12h  Height: 6' (182.9 cm) Weight: 211 lb 1.6 oz (95.8 kg) IBW/kg (Calculated) : 77.6  Temp (24hrs), Avg:98.9 F (37.2 C), Min:97.5 F (36.4 C), Max:100.3 F (37.9 C)  Recent Labs  Lab 07/06/17 0816 07/06/17 0914  WBC 7.7  --   CREATININE 1.26*  --   LATICACIDVEN  --  1.2    Estimated Creatinine Clearance: 56.2 mL/min (A) (by C-G formula based on SCr of 1.26 mg/dL (H)).    Allergies  Allergen Reactions  . Oxycodone-Acetaminophen Other (See Comments)    Causes the pt to be very irritable, says he can take this  . Percocet [Oxycodone-Acetaminophen] Other (See Comments)    Reaction:  Irritability     Antimicrobials this admission: Zosyn 5/22 x 1 in ED Vancomycin 5/22 >>  Cefepime 5/22 >>  Thank you for allowing pharmacy to be a part of this patient's care.  Clovia Cuff, PharmD, BCPS 07/06/2017 1:39 PM

## 2017-07-06 NOTE — ED Triage Notes (Signed)
Pt to ED via [facility] from home for bilateral hand and leg swelling. Pt states that this has been going on for about 1 week. Pt has significant swelling in both lower legs, redness noted. Pt is in NAD at this time.

## 2017-07-06 NOTE — Progress Notes (Signed)
B/l leg cellulitis. Inpatient admission IV abx

## 2017-07-06 NOTE — H&P (Signed)
[facility] [place] [facility]   PATIENT NAME: [name]    MR#:  [id number]  DATE OF BIRTH:  Aug 01, 1934  DATE OF ADMISSION:  07/06/2017  PRIMARY CARE PHYSICIAN: Smitty Cords, DO   REQUESTING/REFERRING PHYSICIAN: Dr. Don Perking  CHIEF COMPLAINT:   Chief Complaint  Patient presents with  . Leg Swelling    HISTORY OF PRESENT ILLNESS:  [name]  is a 83 y.o. male with a known history of diabetes, hypertension, DVT, chronic lower examinee the ulcers presents to the hospital complaining of worsening redness and pain of bilateral legs.  He has been found to have fever of 100.3, redness and swelling and is being admitted to the hospitalist service for IV antibiotics due to extensive nature of cellulitis.  He has used recent oral antibiotics that he does not remember the name for leg infection. No recent procedures.  He did see Dr. Graciela Husbands of podiatry over an year back for his right foot ulcer.  PAST MEDICAL HISTORY:   Past Medical History:  Diagnosis Date  . Arthritis   . CAD (coronary artery disease)   . Congestive heart failure (CHF) (HCC) 06/14/2016   LVEF 40-45% 2016 echo; Dr. Darrold Junker  . COPD (chronic obstructive pulmonary disease) (HCC)   . Dementia   . Depression   . Diabetes mellitus without complication (HCC)   . Family history of adverse reaction to anesthesia    mom seizures   . GERD (gastroesophageal reflux disease)   . Heart disease   . History of blood clots in legs   . History of ischemic cardiomyopathy   . 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 Hyperlipidemia   . Hypertension   . Literacy level of illiterate   . Osteomyelitis (HCC)   . PN (peripheral neuropathy)   . Psoriasis     PAST SURGICAL HISTORY:   Past Surgical History:  Procedure Laterality Date  . amputation 4th finger Left   . APPENDECTOMY    . CLAVICLE SURGERY  2012   open reduction and internal fixation of left clavicle  . CORONARY ARTERY BYPASS GRAFT   2002  . FOOT SURGERY    . IMPLANTABLE CARDIOVERTER DEFIBRILLATOR (ICD) GENERATOR CHANGE N/A 12/03/2015   Procedure: ICD GENERATOR CHANGE;  Surgeon: Marcina Millard, MD;  Location: [facility] ORS;  Service: Cardiovascular;  Laterality: N/A;  . PACEMAKER INSERTION      SOCIAL HISTORY:   Social History   Tobacco Use  . Smoking status: Former Smoker    Packs/day: 0.25    Years: 10.00    Pack years: 2.50    Types: Cigarettes, Cigars    Last attempt to quit: 10/18/1979    Years since quitting: 37.7  . Smokeless tobacco: Never Used  Substance Use Topics  . Alcohol use: No    FAMILY HISTORY:   Family History  Problem Relation Age of Onset  . Cancer Mother        spine  . Hypertension Mother   . Mental illness Mother   . Cancer Father        lung  . Asthma Father   . Allergic rhinitis Father   . Arthritis Father   . Tuberculosis Father   . Heart disease Father   . Gout Brother   . Allergic rhinitis Brother     DRUG ALLERGIES:   Allergies  Allergen Reactions  . Oxycodone-Acetaminophen Other (See Comments)    Causes the pt to be very irritable, says he can take this  . Percocet [  Oxycodone-Acetaminophen] Other (See Comments)    Reaction:  Irritability     REVIEW OF SYSTEMS:   Review of Systems  Constitutional: Positive for malaise/fatigue. Negative for chills and fever.  HENT: Negative for sore throat.   Eyes: Negative for blurred vision, double vision and pain.  Respiratory: Negative for cough, hemoptysis, shortness of breath and wheezing.   Cardiovascular: Negative for chest pain, palpitations, orthopnea and leg swelling.  Gastrointestinal: Negative for abdominal pain, constipation, diarrhea, heartburn, nausea and vomiting.  Genitourinary: Negative for dysuria and hematuria.  Musculoskeletal: Negative for back pain and joint pain.  Skin: Negative for rash.  Neurological: Negative for sensory change, speech change, focal weakness and headaches.  Endo/Heme/Allergies:  Does not bruise/bleed easily.  Psychiatric/Behavioral: Negative for depression. The patient is not nervous/anxious.     MEDICATIONS AT HOME:   Prior to Admission medications   Medication Sig Start Date End Date Taking? Authorizing Provider  acetaminophen (TYLENOL) 500 MG tablet Take 500 mg by mouth every 8 (eight) hours as needed for mild pain or fever.    Yes [provider]  allopurinol (ZYLOPRIM) 100 MG tablet Take 1 tablet (100 mg total) by mouth daily. 06/05/17  Yes Gouru, Deanna Artis, MD  apixaban (ELIQUIS) 5 MG TABS tablet Take 1 tablet (5 mg total) by mouth 2 (two) times daily. 06/29/17  Yes Karamalegos, Netta Neat, DO  aspirin EC 81 MG tablet Take 1 tablet (81 mg total) by mouth daily. 05/11/16  Yes [name], Janit Bern, MD  atorvastatin (LIPITOR) 40 MG tablet Take 1 tablet (40 mg total) by mouth at bedtime. 12/17/16  Yes [name], Janit Bern, MD  bisoprolol (ZEBETA) 5 MG tablet Take 1 tablet (5 mg total) by mouth daily. 06/05/17  Yes Gouru, Deanna Artis, MD  Elastic Bandages & Supports (TRUFORM STOCKINGS 20-30MMHG) MISC Wear on both legs; put on first thing in the morning and remove before bed; do NOT sleep in them 03/24/17  Yes [name], Janit Bern, MD  Elastic Bandages & Supports (WRIST BRACE/RIGHT LARGE) MISC Wear on the right wrist, cock-up wrist splint for carpal tunnel 03/24/17  Yes [name], Janit Bern, MD  furosemide (LASIX) 20 MG tablet Take 1 tablet (20 mg total) by mouth daily. 04/28/17  Yes Karamalegos, Netta Neat, DO  gabapentin (NEURONTIN) 300 MG capsule Take 2 capsules (600 mg total) by mouth 3 (three) times daily. Take 1 pill AM, Take 1 pill Mid-day, Take 2 pills PM x 3 days Take 1 pill AM, Take 2 pills Mid-day, Take 2 pills PM x 3 days Take 2 pills AM, Take 2 pills Mid-day, Take 2 pills PM 05/11/17 07/06/17 Yes Menshew, Charlesetta Ivory, PA-C  ibuprofen (ADVIL,MOTRIN) 200 MG tablet Take 200 mg by mouth every 6 (six) hours as needed for mild pain. Pt takes twice a day, am/pm    Yes [provider]   mupirocin cream (BACTROBAN) 2 % Apply topically daily. 06/05/17  Yes Gouru, Aruna, MD  ondansetron (ZOFRAN) 4 MG tablet Take 1 tablet (4 mg total) by mouth every 6 (six) hours as needed for nausea. 06/04/17  Yes Gouru, Deanna Artis, MD  pantoprazole (PROTONIX) 40 MG tablet Take 1 tablet (40 mg total) by mouth daily. 04/28/17  Yes Karamalegos, Netta Neat, DO  potassium chloride (K-DUR) 10 MEQ tablet Take 1 tablet (10 mEq total) by mouth daily. 04/28/17  Yes Karamalegos, Netta Neat, DO  traMADol (ULTRAM) 50 MG tablet Take 1 tablet (50 mg total) by mouth every 6 (six) hours as needed for moderate pain or severe  pain. 06/04/17  Yes Gouru, Deanna Artis, MD  predniSONE (STERAPRED UNI-PAK 21 TAB) 10 MG (21) TBPK tablet Take 1 tablet (10 mg total) by mouth daily. Take  4 tablets by mouth for 1 day followed by  3 tablets by mouth for 1 day followed by  2 tablets by mouth for 1 day followed by  1 tablet by mouth for a day and stop Patient not taking: Reported on 07/06/2017 06/04/17   Ramonita Lab, MD     VITAL SIGNS:  Blood pressure 127/67, pulse 63, temperature (!) 97.5 F (36.4 C), temperature source Oral, resp. rate 16, height 6' (1.829 m), weight 95.8 kg (211 lb 1.6 oz), SpO2 100 %.  PHYSICAL EXAMINATION:  Physical Exam  GENERAL:  83 y.o.-year-old patient lying in the bed with no acute distress.  EYES: Pupils equal, round, reactive to light and accommodation. No scleral icterus. Extraocular muscles intact.  HEENT: Head atraumatic, normocephalic. Oropharynx and nasopharynx clear. No oropharyngeal erythema, moist oral mucosa  NECK:  Supple, no jugular venous distention. No thyroid enlargement, no tenderness.  LUNGS: Normal breath sounds bilaterally, no wheezing, rales, rhonchi. No use of accessory muscles of respiration.  CARDIOVASCULAR: S1, S2 normal. No murmurs, rubs, or gallops.  ABDOMEN: Soft, nontender, nondistended. Bowel sounds present. No organomegaly or mass.  EXTREMITIES: No pedal edema, cyanosis, or  clubbing. + 2 pedal & radial pulses b/l.   NEUROLOGIC: Cranial nerves II through XII are intact. No focal Motor or sensory deficits appreciated b/l PSYCHIATRIC: The patient is alert and oriented x 3. Good affect.  SKIN: Bilateral lower committee erythema extending up to the knees from feet. Right foot plantar ulcer with some dry gangrene.  No discharge. Left foot plantar small ulcer  LABORATORY PANEL:   CBC Recent Labs  Lab 07/06/17 0816  WBC 7.7  HGB 11.3*  HCT 31.7*  PLT 329   ------------------------------------------------------------------------------------------------------------------  Chemistries  Recent Labs  Lab 07/06/17 0816  NA 134*  K 3.5  CL 107  CO2 20*  GLUCOSE 144*  BUN 15  CREATININE 1.26*  CALCIUM 8.7*  AST 32  ALT 17  ALKPHOS 72  BILITOT 0.6   ------------------------------------------------------------------------------------------------------------------  Cardiac Enzymes No results for input(s): TROPONINI in the last 168 hours. ------------------------------------------------------------------------------------------------------------------  RADIOLOGY:  No results found.   IMPRESSION AND PLAN:   *Bilateral leg cellulitis.  Start IV vancomycin and cefepime.  Extensive areas of cellulitis and patient will need inpatient treatment.  *Right foot ulcer.  Discussed with Dr. Ether Griffins of podiatry.  Will see patient.  May need debridement.  *Diabetes mellitus.  Sliding scale insulin.  *Hypertension.  Continue medications.  *History of DVT.  On Eliquis.  All the records are reviewed and case discussed with ED provider. Management plans discussed with the patient, family and they are in agreement.  CODE STATUS: FULL CODE  TOTAL TIME TAKING CARE OF THIS PATIENT: 45 minutes.   Molinda Bailiff Kathrin Folden M.D on 07/06/2017 at 2:31 PM  Between 7am to 6pm - Pager - [phone]  After 6pm go to [email or web address] - password [email or web address] [facility]  [facility] [place]  [facility]  Office  [phone]  CC: Primary care physician; Smitty Cords, DO  Note: This dictation was prepared with Dragon dictation along with smaller phrase technology. Any transcriptional errors that result from this process are unintentional.

## 2017-07-07 ENCOUNTER — Inpatient Hospital Stay: Payer: Medicare Other

## 2017-07-07 DIAGNOSIS — E119 Type 2 diabetes mellitus without complications: Secondary | ICD-10-CM

## 2017-07-07 DIAGNOSIS — I10 Essential (primary) hypertension: Secondary | ICD-10-CM

## 2017-07-07 DIAGNOSIS — I70248 Atherosclerosis of native arteries of left leg with ulceration of other part of lower left leg: Secondary | ICD-10-CM

## 2017-07-07 DIAGNOSIS — R600 Localized edema: Secondary | ICD-10-CM

## 2017-07-07 LAB — CBC
HCT: 30.7 % — ABNORMAL LOW (ref 40.0–52.0)
Hemoglobin: 10.4 g/dL — ABNORMAL LOW (ref 13.0–18.0)
MCH: 31.9 pg (ref 26.0–34.0)
MCHC: 34 g/dL (ref 32.0–36.0)
MCV: 93.8 fL (ref 80.0–100.0)
PLATELETS: 309 10*3/uL (ref 150–440)
RBC: 3.27 MIL/uL — AB (ref 4.40–5.90)
RDW: 13.8 % (ref 11.5–14.5)
WBC: 6.4 10*3/uL (ref 3.8–10.6)

## 2017-07-07 LAB — BASIC METABOLIC PANEL
Anion gap: 7 (ref 5–15)
BUN: 14 mg/dL (ref 6–20)
CHLORIDE: 106 mmol/L (ref 101–111)
CO2: 22 mmol/L (ref 22–32)
CREATININE: 1.14 mg/dL (ref 0.61–1.24)
Calcium: 8.2 mg/dL — ABNORMAL LOW (ref 8.9–10.3)
GFR calc Af Amer: >60 mL/min (ref >60)
GFR calc non Af Amer: 59 mL/min — ABNORMAL LOW (ref >60)
Glucose, Bld: 158 mg/dL — ABNORMAL HIGH (ref 65–99)
POTASSIUM: 3.8 mmol/L (ref 3.5–5.1)
Sodium: 135 mmol/L (ref 135–145)

## 2017-07-07 LAB — GLUCOSE, CAPILLARY
GLUCOSE-CAPILLARY: 105 mg/dL — AB (ref 65–99)
GLUCOSE-CAPILLARY: 90 mg/dL (ref 65–99)
Glucose-Capillary: 85 mg/dL (ref 65–99)

## 2017-07-07 LAB — POCT CBG MONITORING: CBG: 90

## 2017-07-07 MED ORDER — SODIUM CHLORIDE 0.9 % IV SOLN
INTRAVENOUS | Status: DC
  Administered 2017-07-07 – 2017-07-08 (×3): via INTRAVENOUS

## 2017-07-07 NOTE — Progress Notes (Signed)
[facility] [place] [facility]   PATIENT NAME: [name]    MR#:  [id number]  DATE OF BIRTH:  10-18-1934  SUBJECTIVE:  CHIEF COMPLAINT:   Chief Complaint  Patient presents with  . Leg Swelling   Redness improving in legs. Afebrile  REVIEW OF SYSTEMS:    Review of Systems  Constitutional: Negative for chills and fever.  HENT: Negative for sore throat.   Eyes: Negative for blurred vision, double vision and pain.  Respiratory: Negative for cough, hemoptysis, shortness of breath and wheezing.   Cardiovascular: Positive for leg swelling. Negative for chest pain, palpitations and orthopnea.  Gastrointestinal: Negative for abdominal pain, constipation, diarrhea, heartburn, nausea and vomiting.  Genitourinary: Negative for dysuria and hematuria.  Musculoskeletal: Positive for joint pain. Negative for back pain.  Skin: Negative for rash.  Neurological: Negative for sensory change, speech change, focal weakness and headaches.  Endo/Heme/Allergies: Does not bruise/bleed easily.  Psychiatric/Behavioral: Negative for depression. The patient is not nervous/anxious.     DRUG ALLERGIES:   Allergies  Allergen Reactions  . Oxycodone-Acetaminophen Other (See Comments)    Causes the pt to be very irritable, says he can take this  . Percocet [Oxycodone-Acetaminophen] Other (See Comments)    Reaction:  Irritability     VITALS:  Blood pressure (!) 113/56, pulse 77, temperature 99.4 F (37.4 C), temperature source Oral, resp. rate 18, height 6' (1.829 m), weight 97.5 kg (214 lb 15.2 oz), SpO2 97 %.  PHYSICAL EXAMINATION:   Physical Exam  GENERAL:  83 y.o.-year-old patient lying in the bed with no acute distress.  EYES: Pupils equal, round, reactive to light and accommodation. No scleral icterus. Extraocular muscles intact.  HEENT: Head atraumatic, normocephalic. Oropharynx and nasopharynx clear.  NECK:  Supple, no jugular venous distention. No thyroid  enlargement, no tenderness.  LUNGS: Normal breath sounds bilaterally, no wheezing, rales, rhonchi. No use of accessory muscles of respiration.  CARDIOVASCULAR: S1, S2 normal. No murmurs, rubs, or gallops.  ABDOMEN: Soft, nontender, nondistended. Bowel sounds present. No organomegaly or mass.  EXTREMITIES: Bilateral lower extremity edema with erythema extending up to the knee NEUROLOGIC: Cranial nerves II through XII are intact. No focal Motor or sensory deficits b/l.   PSYCHIATRIC: The patient is alert and oriented x 3.  SKIN: Bilateral foot ulcers  LABORATORY PANEL:   CBC Recent Labs  Lab 07/07/17 0525  WBC 6.4  HGB 10.4*  HCT 30.7*  PLT 309   ------------------------------------------------------------------------------------------------------------------ Chemistries  Recent Labs  Lab 07/06/17 0816 07/07/17 0525  NA 134* 135  K 3.5 3.8  CL 107 106  CO2 20* 22  GLUCOSE 144* 158*  BUN 15 14  CREATININE 1.26* 1.14  CALCIUM 8.7* 8.2*  AST 32  --   ALT 17  --   ALKPHOS 72  --   BILITOT 0.6  --    ------------------------------------------------------------------------------------------------------------------  Cardiac Enzymes No results for input(s): TROPONINI in the last 168 hours. ------------------------------------------------------------------------------------------------------------------  RADIOLOGY:  Dg Foot Complete Left  Result Date: 07/07/2017 CLINICAL DATA:  Bilateral foot ulcerations on the plantar aspect EXAM: LEFT FOOT - COMPLETE 3+ VIEW COMPARISON:  Left foot films of 10/23/2014 FINDINGS: Previous amputation of the distal portion of the fifth metatarsal is noted. There is indistinctness of the cortex of the base of the proximal phalanx of the right fifth toe and with adjacent soft tissue swelling active osteomyelitis cannot be excluded. Also there is indistinctness of the cortical margins of the distal tuft of the great toe  on the left and again  osteomyelitis cannot be excluded. MRI is recommended for more sensitive assessment if indicated clinically. Degenerative joint disease involves the left first MTP joint IMPRESSION: 1. Cannot exclude early osteomyelitis involving the base of the proximal phalanx of the left fifth toe. 2. Cannot exclude osteomyelitis involving the tuft of the distal phalanx of the left great toe. Consider MRI if further assessment is warranted. Electronically Signed   By: Dwyane Dee M.D.   On: 07/07/2017 10:23   Dg Foot Complete Right  Result Date: 07/07/2017 CLINICAL DATA:  Bilateral foot ulcers on the plantar aspect EXAM: RIGHT FOOT COMPLETE - 3+ VIEW COMPARISON:  Right foot films of 05/24/2016 FINDINGS: There is apparent focal demineralization of the head of the right first metatarsal with adjacent soft tissue swelling worrisome for active osteomyelitis. Also the tuft of the distal phalanx of the right great toe is not as well corticated as noted previously and again osteomyelitis is a definite consideration. No other interval change is seen. IMPRESSION: Focal demineralization of a portion of the head of the distal right first metatarsal with some indistinctness of the tuft of the distal phalanx of the right great toe, findings suspicious for active osteomyelitis. Electronically Signed   By: Dwyane Dee M.D.   On: 07/07/2017 10:19     ASSESSMENT AND PLAN:   * Bilateral leg cellulitis.  On IV vancomycin and cefepime.  Extensive areas of cellulitis. Some improvement today.  *Bilateral foot ulcers Concern regarding osteomyelitis in bilateral feet Vascular surgery consulted.  Evaluation for peripheral arterial disease with angiogram.  Culture sent by podiatry.  Appreciate input.  *Diabetes mellitus.  Sliding scale insulin.  *Hypertension.  Continue medications.  *History of DVT.  On Eliquis.  All the records are reviewed and case discussed with Care Management/Social Worker Management plans discussed with  the patient, family and they are in agreement.  CODE STATUS: FULL CODE  DVT Prophylaxis: SCDs  TOTAL TIME TAKING CARE OF THIS PATIENT: 35 minutes.   POSSIBLE D/C IN 1-2 DAYS, DEPENDING ON CLINICAL CONDITION.  Molinda Bailiff Sonam Wandel M.D on 07/07/2017 at 12:58 PM  Between 7am to 6pm - Pager - [phone]  After 6pm go to [email or web address] - password [email or web address] [facility]  [facility]  Office  [phone]  CC: Primary care physician; Smitty Cords, DO  Note: This dictation was prepared with Dragon dictation along with smaller phrase technology. Any transcriptional errors that result from this process are unintentional.

## 2017-07-07 NOTE — Progress Notes (Signed)
Chaplain responded to a page from nurse. Pt was alert and talked about his religious conversion and how he mistreated his mother , grandmother and wife.At this time he begin to mourn with very little tears  He talked about being a [employer] and religious matters. Chaplain asked what is it he wanted the chaplain to hear. He said he wanted the chaplains judgement on these matters. Chaplain told him he don't judge. Pt  seemed to be venting to clear his conscious. Chaplain prayed with pt for his peace and forgiveness of self. Chaplain will follow up.    07/07/17 1300  Clinical Encounter Type  Visited With Patient  Visit Type Initial  Referral From Chaplain  Spiritual Encounters  Spiritual Needs Prayer;Emotional

## 2017-07-07 NOTE — Consult Note (Signed)
ORTHOPAEDIC CONSULTATION  REQUESTING PHYSICIAN: Milagros Loll, MD  Chief Complaint: Bilateral lower extremity cellulitis  HPI: [name] is a 83 y.o. male who complains of redness to his lower legs.  He has a long-standing history of ulcers.  Has been over a year since he is last been evaluated by podiatry.  Has been admitted recently with cellulitis of his lower legs.  Past Medical History:  Diagnosis Date  . Arthritis   . CAD (coronary artery disease)   . Congestive heart failure (CHF) (HCC) 06/14/2016   LVEF 40-45% 2016 echo; Dr. Darrold Junker  . COPD (chronic obstructive pulmonary disease) (HCC)   . Dementia   . Depression   . Diabetes mellitus without complication (HCC)   . Family history of adverse reaction to anesthesia    mom seizures   . GERD (gastroesophageal reflux disease)   . Heart disease   . History of blood clots in legs   . History of ischemic cardiomyopathy   . 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 Hyperlipidemia   . Hypertension   . Literacy level of illiterate   . Osteomyelitis (HCC)   . PN (peripheral neuropathy)   . Psoriasis    Past Surgical History:  Procedure Laterality Date  . amputation 4th finger Left   . APPENDECTOMY    . CLAVICLE SURGERY  2012   open reduction and internal fixation of left clavicle  . CORONARY ARTERY BYPASS GRAFT  2002  . FOOT SURGERY    . IMPLANTABLE CARDIOVERTER DEFIBRILLATOR (ICD) GENERATOR CHANGE N/A 12/03/2015   Procedure: ICD GENERATOR CHANGE;  Surgeon: Marcina Millard, MD;  Location: [facility] ORS;  Service: Cardiovascular;  Laterality: N/A;  . PACEMAKER INSERTION     Social History   Socioeconomic History  . Marital status: Divorced    Spouse name: Not on file  . Number of children: Not on file  . Years of education: Not on file  . Highest education level: Not on file  Occupational History  . Not on file  Social Needs  . Financial resource strain: Not on file  . Food insecurity:    Worry: Not on file   Inability: Not on file  . Transportation needs:    Medical: Not on file    Non-medical: Not on file  Tobacco Use  . Smoking status: Former Smoker    Packs/day: 0.25    Years: 10.00    Pack years: 2.50    Types: Cigarettes, Cigars    Last attempt to quit: 10/18/1979    Years since quitting: 37.7  . Smokeless tobacco: Never Used  Substance and Sexual Activity  . Alcohol use: No  . Drug use: No  . Sexual activity: Yes  Lifestyle  . Physical activity:    Days per week: Not on file    Minutes per session: Not on file  . Stress: Not on file  Relationships  . Social connections:    Talks on phone: Not on file    Gets together: Not on file    Attends religious service: Not on file    Active member of club or organization: Not on file    Attends meetings of clubs or organizations: Not on file    Relationship status: Not on file  Other Topics Concern  . Not on file  Social History Narrative  . Not on file   Family History  Problem Relation Age of Onset  . Cancer Mother        spine  . Hypertension Mother   .  Mental illness Mother   . Cancer Father        lung  . Asthma Father   . Allergic rhinitis Father   . Arthritis Father   . Tuberculosis Father   . Heart disease Father   . Gout Brother   . Allergic rhinitis Brother    Allergies  Allergen Reactions  . Oxycodone-Acetaminophen Other (See Comments)    Causes the pt to be very irritable, says he can take this  . Percocet [Oxycodone-Acetaminophen] Other (See Comments)    Reaction:  Irritability    Prior to Admission medications   Medication Sig Start Date End Date Taking? Authorizing Provider  acetaminophen (TYLENOL) 500 MG tablet Take 500 mg by mouth every 8 (eight) hours as needed for mild pain or fever.    Yes [provider]  allopurinol (ZYLOPRIM) 100 MG tablet Take 1 tablet (100 mg total) by mouth daily. 06/05/17  Yes Gouru, Deanna Artis, MD  apixaban (ELIQUIS) 5 MG TABS tablet Take 1 tablet (5 mg total) by  mouth 2 (two) times daily. 06/29/17  Yes Karamalegos, Netta Neat, DO  aspirin EC 81 MG tablet Take 1 tablet (81 mg total) by mouth daily. 05/11/16  Yes [name], Janit Bern, MD  atorvastatin (LIPITOR) 40 MG tablet Take 1 tablet (40 mg total) by mouth at bedtime. 12/17/16  Yes [name], Janit Bern, MD  bisoprolol (ZEBETA) 5 MG tablet Take 1 tablet (5 mg total) by mouth daily. 06/05/17  Yes Gouru, Deanna Artis, MD  Elastic Bandages & Supports (TRUFORM STOCKINGS 20-30MMHG) MISC Wear on both legs; put on first thing in the morning and remove before bed; do NOT sleep in them 03/24/17  Yes [name], Janit Bern, MD  Elastic Bandages & Supports (WRIST BRACE/RIGHT LARGE) MISC Wear on the right wrist, cock-up wrist splint for carpal tunnel 03/24/17  Yes [name], Janit Bern, MD  furosemide (LASIX) 20 MG tablet Take 1 tablet (20 mg total) by mouth daily. 04/28/17  Yes Karamalegos, Netta Neat, DO  gabapentin (NEURONTIN) 300 MG capsule Take 2 capsules (600 mg total) by mouth 3 (three) times daily. Take 1 pill AM, Take 1 pill Mid-day, Take 2 pills PM x 3 days Take 1 pill AM, Take 2 pills Mid-day, Take 2 pills PM x 3 days Take 2 pills AM, Take 2 pills Mid-day, Take 2 pills PM 05/11/17 07/06/17 Yes Menshew, Charlesetta Ivory, PA-C  ibuprofen (ADVIL,MOTRIN) 200 MG tablet Take 200 mg by mouth every 6 (six) hours as needed for mild pain. Pt takes twice a day, am/pm    Yes [provider]  mupirocin cream (BACTROBAN) 2 % Apply topically daily. 06/05/17  Yes Gouru, Aruna, MD  ondansetron (ZOFRAN) 4 MG tablet Take 1 tablet (4 mg total) by mouth every 6 (six) hours as needed for nausea. 06/04/17  Yes Gouru, Deanna Artis, MD  pantoprazole (PROTONIX) 40 MG tablet Take 1 tablet (40 mg total) by mouth daily. 04/28/17  Yes Karamalegos, Netta Neat, DO  potassium chloride (K-DUR) 10 MEQ tablet Take 1 tablet (10 mEq total) by mouth daily. 04/28/17  Yes Karamalegos, Netta Neat, DO  traMADol (ULTRAM) 50 MG tablet Take 1 tablet (50 mg total) by mouth every 6 (six) hours as  needed for moderate pain or severe pain. 06/04/17  Yes Gouru, Deanna Artis, MD  predniSONE (STERAPRED UNI-PAK 21 TAB) 10 MG (21) TBPK tablet Take 1 tablet (10 mg total) by mouth daily. Take  4 tablets by mouth for 1 day followed by  3 tablets by mouth for 1  day followed by  2 tablets by mouth for 1 day followed by  1 tablet by mouth for a day and stop Patient not taking: Reported on 07/06/2017 06/04/17   Ramonita Lab, MD   No results found.  Positive ROS: All other systems have been reviewed and were otherwise negative with the exception of those mentioned in the HPI and as above.  12 point ROS was performed.  Physical Exam: General: Alert and oriented.  No apparent distress.  Vascular:  Left foot:Dorsalis Pedis:  absent Posterior Tibial:  absent  Right foot: Dorsalis Pedis:  absent Posterior Tibial:  absent  Neuro:absent protective sensation  Derm: Right foot: Noted diffuse ulceration to the right forefoot under the second third and fourth MTPJ's.  An area that probes deep into the webspace is noted.  Debridement performed with scissors down to subcutaneous tissue.  Deep wound culture performed to the area.  Noted diffuse cellulitis.  No purulent drainage.  Left foot: Small focal 1 cm ulceration under the left first MTPJ.  Noted subcutaneous tissue exposed to the region.  Debridement performed with scissors removing subcutaneous tissue.  Deep wound culture performed to the area.  Ortho/MS: Diffuse bilateral lower extremity edema.  Assessment: Diabetic foot ulcer bilateral feet to subcutaneous tissue and intermetatarsal space.  Diffuse bilateral lower extremity cellulitis  Diabetes with neuropathy   Plan: Excisional debridement performed to the left and right foot ulcers.  Debridement was performed with scissors down to subcutaneous tissue.  Ulcer on the left foot measures 1 cm in diameter.  Diffuse ulcer on the right foot measures approximately 4 cm with a central probing wound of  approximately 1 cm.  May need debridement but will ask vascular to evaluate first.  Deep wound cultures were performed today.  We will order x-rays of both feet.  Nonpalpable pulses noted today.  Will consult vascular surgery for evaluation.  Patient is at high risk of amputation with diabetic foot ulcers cellulitis neuropathy and nonpalpable pulses.  We will follow.    Irean Hong, DPM Cell [phone]   07/07/2017 8:08 AM

## 2017-07-07 NOTE — Consult Note (Signed)
[facility] Vascular Consult Note  MRN : [id number]  [name] is a 83 y.o. (Dec 03, 1934) male who presents with chief complaint of  Chief Complaint  Patient presents with  . 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  Patient is on IV vancomycin and cefepime.  The patient is a poor historian and is unable to provide an accurate history however it seems that he has had his lower extremity ulcerations for "weeks".  The patient does feel strongly that Jesus will heal these ulcerations however is open to our assistance in the matter.  The patient does experience bilateral lower extremity heel and foot pain. Unsure as to the wound care the patient has been receiving.  It does not seem that the patient engages in conservative therapy including wearing medical grade 1 compression socks or elevating his legs on a regular basis.  Right lower extremity symptoms, ulcerations and cellulitis seem to be worse when compared to the left. Patient denies any fever, nausea or vomiting.  Vascular surgery was consulted by podiatry, Dr. Ether Griffins for further recommendations including possible angiogram.  Current Facility-Administered Medications  Medication Dose Route Frequency Provider Last Rate Last Dose  . 0.9 %  sodium chloride infusion   Intravenous Continuous Kaylanni Ezelle A, PA-C      . acetaminophen (TYLENOL) tablet 650 mg  650 mg Oral Q6H PRN Milagros Loll, MD       Or  . acetaminophen (TYLENOL) suppository 650 mg  650 mg Rectal  Q6H PRN Sudini, Srikar, MD      . albuterol (PROVENTIL) (2.5 MG/3ML) 0.083% nebulizer solution 2.5 mg  2.5 mg Nebulization Q2H PRN Sudini, Srikar, MD      . allopurinol (ZYLOPRIM) tablet 100 mg  100 mg Oral Daily Milagros Loll, MD   100 mg at 07/07/17 1003  . apixaban (ELIQUIS) tablet 5 mg  5 mg Oral BID Milagros Loll, MD   5 mg at 07/07/17 1001  . aspirin EC tablet 81 mg  81 mg Oral Daily Milagros Loll, MD   81 mg at 07/07/17 1001  . atorvastatin (LIPITOR) tablet 40 mg  40 mg Oral QHS Milagros Loll, MD   40 mg at 07/06/17 2116  . bisoprolol (ZEBETA) tablet 5 mg  5 mg Oral Daily Milagros Loll, MD   5 mg at 07/07/17 1003  . ceFEPIme (MAXIPIME) 2 g in sodium chloride 0.9 % 100 mL IVPB  2 g Intravenous Q12H Milagros Loll, MD   Stopped at 07/07/17 1040  . furosemide (LASIX) tablet 20 mg  20 mg Oral Daily Milagros Loll, MD   20 mg at 07/07/17 1001  . gabapentin (NEURONTIN) capsule 600 mg  600 mg Oral TID Milagros Loll, MD   600 mg at 07/07/17 1001  . insulin aspart (novoLOG) injection 0-5 Units  0-5 Units Subcutaneous QHS Sudini, Srikar, MD      . insulin aspart (novoLOG) injection 0-9 Units  0-9 Units Subcutaneous TID WC Sudini, Srikar, MD      . ondansetron (ZOFRAN) tablet 4 mg  4 mg Oral Q6H PRN Milagros Loll, MD       Or  .  ondansetron (ZOFRAN) injection 4 mg  4 mg Intravenous Q6H PRN Sudini, Wardell Heath, MD      . pantoprazole (PROTONIX) EC tablet 40 mg  40 mg Oral Daily Milagros Loll, MD   40 mg at 07/07/17 1001  . polyethylene glycol (MIRALAX / GLYCOLAX) packet 17 g  17 g Oral Daily PRN Sudini, Srikar, MD      . potassium chloride (K-DUR,KLOR-CON) CR tablet 10 mEq  10 mEq Oral Daily Milagros Loll, MD   10 mEq at 07/07/17 1001  . traMADol (ULTRAM) tablet 50 mg  50 mg Oral Q6H PRN Milagros Loll, MD   50 mg at 07/07/17 1000  . vancomycin (VANCOCIN) 1,500 mg in sodium chloride 0.9 % 500 mL IVPB  1,500 mg Intravenous Q18H Milagros Loll, MD 250 mL/hr at 07/07/17 1244 1,500 mg at 07/07/17 1244    Past Medical History:  Diagnosis Date  . Arthritis   . CAD (coronary artery disease)   . Congestive heart failure (CHF) (HCC) 06/14/2016   LVEF 40-45% 2016 echo; Dr. Darrold Junker  . COPD (chronic obstructive pulmonary disease) (HCC)   . Dementia   . Depression   . Diabetes mellitus without complication (HCC)   . Family history of adverse reaction to anesthesia    mom seizures   . GERD (gastroesophageal reflux disease)   . Heart disease   . History of blood clots in legs   . History of ischemic cardiomyopathy   . 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 Hyperlipidemia   . Hypertension   . Literacy level of illiterate   . Osteomyelitis (HCC)   . PN (peripheral neuropathy)   . Psoriasis    Past Surgical History:  Procedure Laterality Date  . amputation 4th finger Left   . APPENDECTOMY    . CLAVICLE SURGERY  2012   open reduction and internal fixation of left clavicle  . CORONARY ARTERY BYPASS GRAFT  2002  . FOOT SURGERY    . IMPLANTABLE CARDIOVERTER DEFIBRILLATOR (ICD) GENERATOR CHANGE N/A 12/03/2015   Procedure: ICD GENERATOR CHANGE;  Surgeon: Marcina Millard, MD;  Location: [facility] ORS;  Service: Cardiovascular;  Laterality: N/A;  . PACEMAKER INSERTION     Social History Social History   Tobacco Use  . Smoking status: Former Smoker    Packs/day: 0.25    Years: 10.00    Pack years: 2.50    Types: Cigarettes, Cigars    Last attempt to quit: 10/18/1979    Years since quitting: 37.7  . Smokeless tobacco: Never Used  Substance Use Topics  . Alcohol use: No  . Drug use: No   Family History Family History  Problem Relation Age of Onset  . Cancer Mother        spine  . Hypertension Mother   . Mental illness Mother   . Cancer Father        lung  . Asthma Father   . Allergic rhinitis Father   . Arthritis Father   . Tuberculosis Father   . Heart disease Father   . Gout Brother   . Allergic rhinitis Brother   Patient denies any family history of peripheral artery  disease venous disease or renal disease  Allergies  Allergen Reactions  . Oxycodone-Acetaminophen Other (See Comments)    Causes the pt to be very irritable, says he can take this  . Percocet [Oxycodone-Acetaminophen] Other (See Comments)    Reaction:  Irritability    REVIEW OF SYSTEMS (Negative unless checked)  Constitutional: Weight loss  Fever  Chills Cardiac:   Chest pain   Chest pressure   Palpitations   Shortness of breath when laying flat   Shortness of breath at rest   Shortness of breath with exertion. Vascular:  Pain in legs with walking   Pain in legs at rest   Pain in legs when laying flat   Claudication   Pain in feet when walking  Pain in feet at rest  Pain in feet when laying flat   History of DVT   Phlebitis   Swelling in legs   Varicose veins   Non-healing ulcers Pulmonary:   Uses home oxygen   Productive cough   Hemoptysis   Wheeze  COPD   Asthma Neurologic:  Dizziness  Blackouts   Seizures   History of stroke   History of TIA  Aphasia   Temporary blindness   Dysphagia   Weakness or numbness in arms   Weakness or numbness in legs Musculoskeletal:  Arthritis   Joint swelling   Joint pain   Low back pain Hematologic:  Easy bruising  Easy bleeding   Hypercoagulable state   Anemic  Hepatitis Gastrointestinal:  Blood in stool   Vomiting blood  Gastroesophageal reflux/heartburn   Difficulty swallowing. Genitourinary:  Chronic kidney disease   Difficult urination  Frequent urination  Burning with urination   Blood in urine Skin:  Rashes   Ulcers   Wounds Psychological:  History of anxiety    History of major depression.  Physical Examination  Vitals:   07/06/17 1030 07/06/17 1110 07/06/17 1958 07/07/17 0334  BP: 122/62 127/67 132/60 (!) 113/56  Pulse: 62 63 67 77  Resp:  Temp:  (!) 97.5 F (36.4 C) 97.7 F (36.5 C) 99.4 F (37.4 C)   TempSrc:  Oral Oral Oral  SpO2: 95% 100% 100% 97%  Weight:  211 lb 1.6 oz (95.8 kg)  214 lb 15.2 oz (97.5 kg)  Height:  6' (1.829 m)     Body mass index is 29.15 kg/m. Gen:  WD/WN, NAD Head: [place]/AT, No temporalis wasting. Prominent temp pulse not noted. Ear/Nose/Throat: Hearing grossly intact, nares w/o erythema or drainage, oropharynx w/o Erythema/Exudate Eyes: Sclera non-icteric, conjunctiva clear Neck: Trachea midline.  No JVD.  Pulmonary:  Good air movement, respirations not labored, equal bilaterally.  Cardiac: RRR, normal S1, S2. Vascular:  Vessel Right Left  Radial Palpable Palpable  Ulnar Palpable Palpable  Brachial Palpable Palpable  Carotid Palpable, without bruit Palpable, without bruit  Aorta Not palpable N/A  Femoral Palpable Palpable  Popliteal Palpable Palpable  PT Palpable Palpable  DP Palpable Palpable   Right Lower Extremity: Ulceration noted to the right forefoot.  Diffuse cellulitis. Left lower extremity: Cellulitis noted.  Moderate to severe bilateral lower extremity edema noted  Gastrointestinal: soft, non-tender/non-distended. No guarding/reflex.  Musculoskeletal: M/S 5/5 throughout.  Extremities without ischemic changes.  No deformity or atrophy. No edema. Neurologic: Sensation grossly intact in extremities.  Symmetrical.  Speech is fluent. Motor exam as listed above. Psychiatric: Judgment intact, Mood & affect appropriate for pt's clinical situation. Dermatologic: No rashes or ulcers noted.  No cellulitis or open wounds. Lymph : No Cervical, Axillary, or Inguinal lymphadenopathy.  CBC Lab Results  Component Value Date   WBC 6.4 07/07/2017   HGB 10.4 (L) 07/07/2017   HCT 30.7 (L) 07/07/2017   MCV 93.8 07/07/2017   PLT 309 07/07/2017   BMET    Component Value Date/Time   NA 135 07/07/2017 0525   NA 139 07/18/2015 1655  NA 138 09/08/2013 1144   K 3.8 07/07/2017 0525   K 3.8 09/08/2013 1144   CL 106 07/07/2017 0525   CL 106 09/08/2013  1144   CO2 22 07/07/2017 0525   CO2 24 09/08/2013 1144   GLUCOSE 158 (H) 07/07/2017 0525   GLUCOSE 148 (H) 09/08/2013 1144   BUN 14 07/07/2017 0525   BUN 16 07/18/2015 1655   BUN 11 09/08/2013 1144   CREATININE 1.14 07/07/2017 0525   CREATININE 1.27 (H) 11/24/2016 1124   CALCIUM 8.2 (L) 07/07/2017 0525   CALCIUM 9.2 09/08/2013 1144   GFRNONAA 59 (L) 07/07/2017 0525   GFRNONAA 53 (L) 11/24/2016 1124   GFRAA >60 07/07/2017 0525   GFRAA 61 11/24/2016 1124   Estimated Creatinine Clearance: 62.6 mL/min (by C-G formula based on SCr of 1.14 mg/dL).  COAG Lab Results  Component Value Date   INR 0.97 11/25/2015   INR 1.09 08/09/2015   Radiology Dg Foot Complete Left  Result Date: 07/07/2017 CLINICAL DATA:  Bilateral foot ulcerations on the plantar aspect EXAM: LEFT FOOT - COMPLETE 3+ VIEW COMPARISON:  Left foot films of 10/23/2014 FINDINGS: Previous amputation of the distal portion of the fifth metatarsal is noted. There is indistinctness of the cortex of the base of the proximal phalanx of the right fifth toe and with adjacent soft tissue swelling active osteomyelitis cannot be excluded. Also there is indistinctness of the cortical margins of the distal tuft of the great toe on the left and again osteomyelitis cannot be excluded. MRI is recommended for more sensitive assessment if indicated clinically. Degenerative joint disease involves the left first MTP joint IMPRESSION: 1. Cannot exclude early osteomyelitis involving the base of the proximal phalanx of the left fifth toe. 2. Cannot exclude osteomyelitis involving the tuft of the distal phalanx of the left great toe. Consider MRI if further assessment is warranted. Electronically Signed   By: Dwyane Dee M.D.   On: 07/07/2017 10:23   Dg Foot Complete Right  Result Date: 07/07/2017 CLINICAL DATA:  Bilateral foot ulcers on the plantar aspect EXAM: RIGHT FOOT COMPLETE - 3+ VIEW COMPARISON:  Right foot films of 05/24/2016 FINDINGS: There is  apparent focal demineralization of the head of the right first metatarsal with adjacent soft tissue swelling worrisome for active osteomyelitis. Also the tuft of the distal phalanx of the right great toe is not as well corticated as noted previously and again osteomyelitis is a definite consideration. No other interval change is seen. IMPRESSION: Focal demineralization of a portion of the head of the distal right first metatarsal with some indistinctness of the tuft of the distal phalanx of the right great toe, findings suspicious for active osteomyelitis. Electronically Signed   By: Dwyane Dee M.D.   On: 07/07/2017 10:19   Assessment/Plan The patient is an 83 year old male with a past medical history of bilateral lower extremity edema, recurrent cellulitis peripheral artery disease who presents with extensive right foot ulcer and small left ulcer noted to the left foot. 1.  Peripheral artery disease: Patient with multiple risk factors for peripheral artery disease.  The patient does not have palpable pulses on exam.  The patient has recurrent cellulitis in the setting of nonhealing ulcerations.  Recommend starting with a right lower extremity angiogram to assess the patient's anatomy and degree of contributing peripheral artery disease.  At that time if appropriate an attempt to revascularize the leg can be made.  This will be scheduled for either today or next Wednesday depending on  scheduling.  The patient would also benefit from a left lower extremity angiogram however this can be scheduled as an outpatient. 2.  Lower extremity edema: Would apply 3 layer zinc oxide and wraps to the bilateral lower extremity to help control the patient's edema.  The patient should follow-up with [place] as an outpatient for formal work-up. 3. Diabetes: Encouraged good control as its slows the progression of atherosclerotic disease 4. Hypertension: Encouraged good control as its slows the progression of atherosclerotic  disease 5. Hyperlipidemia: Encouraged good control as its slows the progression of atherosclerotic disease  Discussed with Dr. Weldon Inches, PA-C  07/07/2017 2:34 PM  This note was created with Dragon medical transcription system.  Any error is purely unintentional.

## 2017-07-08 LAB — VANCOMYCIN, TROUGH: VANCOMYCIN TR: 13 ug/mL — AB (ref 15–20)

## 2017-07-08 LAB — GLUCOSE, CAPILLARY
GLUCOSE-CAPILLARY: 122 mg/dL — AB (ref 65–99)
Glucose-Capillary: 143 mg/dL — ABNORMAL HIGH (ref 65–99)
Glucose-Capillary: 105 mg/dL — ABNORMAL HIGH (ref 65–99)

## 2017-07-08 MED ORDER — LINEZOLID 600 MG PO TABS
600.0000 mg | ORAL_TABLET | Freq: Two times a day (BID) | ORAL | Status: DC
  Administered 2017-07-08 – 2017-07-09 (×2): 600 mg via ORAL
  Filled 2017-07-08 (×4): qty 1

## 2017-07-08 NOTE — Progress Notes (Signed)
Daily Progress Note   Subjective  - * No surgery found *  F.u b/l ulcers.  No complaints  Objective Vitals:   07/07/17 1434 07/07/17 1940 07/08/17 0353 07/08/17 1324  BP: 122/63 140/80 (!) 141/73 140/64  Pulse: 62 74 67 60  Resp: Temp: 97.6 F (36.4 C) (!) 97.5 F (36.4 C) 97.6 F (36.4 C)   TempSrc: Oral Oral Oral   SpO2: 100% 97% 95% 98%  Weight:   97.8 kg (215 lb 9.6 oz)   Height:        Physical Exam: Cellulitis is improved.  Ulcers without purulence.   Laboratory CBC    Component Value Date/Time   WBC 6.4 07/07/2017 0525   HGB 10.4 (L) 07/07/2017 0525   HGB 12.9 (L) 09/08/2013 1144   HCT 30.7 (L) 07/07/2017 0525   HCT 38.9 (L) 09/08/2013 1144   PLT 309 07/07/2017 0525   PLT 208 09/08/2013 1144    BMET    Component Value Date/Time   NA 135 07/07/2017 0525   NA 139 07/18/2015 1655   NA 138 09/08/2013 1144   K 3.8 07/07/2017 0525   K 3.8 09/08/2013 1144   CL 106 07/07/2017 0525   CL 106 09/08/2013 1144   CO2 22 07/07/2017 0525   CO2 24 09/08/2013 1144   GLUCOSE 158 (H) 07/07/2017 0525   GLUCOSE 148 (H) 09/08/2013 1144   BUN 14 07/07/2017 0525   BUN 16 07/18/2015 1655   BUN 11 09/08/2013 1144   CREATININE 1.14 07/07/2017 0525   CREATININE 1.27 (H) 11/24/2016 1124   CALCIUM 8.2 (L) 07/07/2017 0525   CALCIUM 9.2 09/08/2013 1144   GFRNONAA 59 (L) 07/07/2017 0525   GFRNONAA 53 (L) 11/24/2016 1124   GFRAA >60 07/07/2017 0525   GFRAA 61 11/24/2016 1124   Culture FEW WBC PRESENT, PREDOMINANTLY PMN  FEW GRAM NEGATIVE RODS  RARE GRAM POSITIVE COCCI  RARE GRAM POSITIVE RODS   Assessment/Planning: Diabetic foot ulcer b/l.   Stable from podiatry standpoint as cellulitis improved and WBC normal.  Xrays negative.  No obvious osteo at sites of ulcers.  Can monitor outpt.  Awaiting wound cultures.  Vascular recommeding angio possibly outpt.  Will need dressing changes.  Flush wound with saline and apply bulky dry dressing.  Should  minimize weight bearing to foot as tolerated.  PO shoes ordered.  F/U with podiatry in 2 weeks.  Gwyneth Revels A  07/08/2017, 2:38 PM

## 2017-07-08 NOTE — Care Management Important Message (Signed)
Important Message  Patient Details  Name: [name] MRN: [id number] Date of Birth: [birth year]   Medicare Important Message Given:  Yes    [name] 07/08/2017, 10:40 AM

## 2017-07-08 NOTE — Progress Notes (Signed)
MD notified of pt refusing FSBS and increased agitation. See new orders. Kinnie Scales, LPN

## 2017-07-08 NOTE — Progress Notes (Signed)
   07/08/17 1330  Clinical Encounter Type  Visited With Patient  Visit Type Initial  Referral From Nurse  Consult/Referral To Chaplain  Spiritual Encounters  Spiritual Needs Emotional;Other (Comment)   CH received a PG to check in on PT as he seemed to be upset about decisions from his past. CH went to PT's RM and found PT alert and willing to talk. PT gave CH little opportunity to interject in the conversation. CH felt that PT was lonely and wanted to talk to someone. CH actively listened and offered to come back and spend more time with PT. PT was agreeable to the offer.

## 2017-07-08 NOTE — Progress Notes (Signed)
[facility] [place] [facility]   PATIENT NAME: [name]    MR#:  [id number]  DATE OF BIRTH:  Jan 10, 1935  SUBJECTIVE:  CHIEF COMPLAINT:   Chief Complaint  Patient presents with  . Leg Swelling   Bilateral lower committee edema and redness still present but slowly improving.  REVIEW OF SYSTEMS:    Review of Systems  Constitutional: Negative for chills and fever.  HENT: Negative for sore throat.   Eyes: Negative for blurred vision, double vision and pain.  Respiratory: Negative for cough, hemoptysis, shortness of breath and wheezing.   Cardiovascular: Positive for leg swelling. Negative for chest pain, palpitations and orthopnea.  Gastrointestinal: Negative for abdominal pain, constipation, diarrhea, heartburn, nausea and vomiting.  Genitourinary: Negative for dysuria and hematuria.  Musculoskeletal: Positive for joint pain. Negative for back pain.  Skin: Negative for rash.  Neurological: Negative for sensory change, speech change, focal weakness and headaches.  Endo/Heme/Allergies: Does not bruise/bleed easily.  Psychiatric/Behavioral: Negative for depression. The patient is not nervous/anxious.     DRUG ALLERGIES:   Allergies  Allergen Reactions  . Oxycodone-Acetaminophen Other (See Comments)    Causes the pt to be very irritable, says he can take this  . Percocet [Oxycodone-Acetaminophen] Other (See Comments)    Reaction:  Irritability     VITALS:  Blood pressure (!) 141/73, pulse 67, temperature 97.6 F (36.4 C), temperature source Oral, resp. rate 18, height 6' (1.829 m), weight 97.8 kg (215 lb 9.6 oz), SpO2 95 %.  PHYSICAL EXAMINATION:   Physical Exam  GENERAL:  83 y.o.-year-old patient lying in the bed with no acute distress.  EYES: Pupils equal, round, reactive to light and accommodation. No scleral icterus. Extraocular muscles intact.  HEENT: Head atraumatic, normocephalic. Oropharynx and nasopharynx clear.  NECK:  Supple, no  jugular venous distention. No thyroid enlargement, no tenderness.  LUNGS: Normal breath sounds bilaterally, no wheezing, rales, rhonchi. No use of accessory muscles of respiration.  CARDIOVASCULAR: S1, S2 normal. No murmurs, rubs, or gallops.  ABDOMEN: Soft, nontender, nondistended. Bowel sounds present. No organomegaly or mass.  EXTREMITIES: Bilateral lower extremity edema with erythema extending up to the knee NEUROLOGIC: Cranial nerves II through XII are intact. No focal Motor or sensory deficits b/l.   PSYCHIATRIC: The patient is alert and oriented x 3.  SKIN: Bilateral foot ulcers  LABORATORY PANEL:   CBC Recent Labs  Lab 07/07/17 0525  WBC 6.4  HGB 10.4*  HCT 30.7*  PLT 309   ------------------------------------------------------------------------------------------------------------------ Chemistries  Recent Labs  Lab 07/06/17 0816 07/07/17 0525  NA 134* 135  K 3.5 3.8  CL 107 106  CO2 20* 22  GLUCOSE 144* 158*  BUN 15 14  CREATININE 1.26* 1.14  CALCIUM 8.7* 8.2*  AST 32  --   ALT 17  --   ALKPHOS 72  --   BILITOT 0.6  --    ------------------------------------------------------------------------------------------------------------------  Cardiac Enzymes No results for input(s): TROPONINI in the last 168 hours. ------------------------------------------------------------------------------------------------------------------  RADIOLOGY:  Dg Foot Complete Left  Result Date: 07/07/2017 CLINICAL DATA:  Bilateral foot ulcerations on the plantar aspect EXAM: LEFT FOOT - COMPLETE 3+ VIEW COMPARISON:  Left foot films of 10/23/2014 FINDINGS: Previous amputation of the distal portion of the fifth metatarsal is noted. There is indistinctness of the cortex of the base of the proximal phalanx of the right fifth toe and with adjacent soft tissue swelling active osteomyelitis cannot be excluded. Also there is indistinctness of the cortical margins of the  distal tuft of the  great toe on the left and again osteomyelitis cannot be excluded. MRI is recommended for more sensitive assessment if indicated clinically. Degenerative joint disease involves the left first MTP joint IMPRESSION: 1. Cannot exclude early osteomyelitis involving the base of the proximal phalanx of the left fifth toe. 2. Cannot exclude osteomyelitis involving the tuft of the distal phalanx of the left great toe. Consider MRI if further assessment is warranted. Electronically Signed   By: Dwyane Dee M.D.   On: 07/07/2017 10:23   Dg Foot Complete Right  Result Date: 07/07/2017 CLINICAL DATA:  Bilateral foot ulcers on the plantar aspect EXAM: RIGHT FOOT COMPLETE - 3+ VIEW COMPARISON:  Right foot films of 05/24/2016 FINDINGS: There is apparent focal demineralization of the head of the right first metatarsal with adjacent soft tissue swelling worrisome for active osteomyelitis. Also the tuft of the distal phalanx of the right great toe is not as well corticated as noted previously and again osteomyelitis is a definite consideration. No other interval change is seen. IMPRESSION: Focal demineralization of a portion of the head of the distal right first metatarsal with some indistinctness of the tuft of the distal phalanx of the right great toe, findings suspicious for active osteomyelitis. Electronically Signed   By: Dwyane Dee M.D.   On: 07/07/2017 10:19     ASSESSMENT AND PLAN:   * Bilateral leg cellulitis.  On IV vancomycin and cefepime.  Extensive areas of cellulitis.  Slowly improving  *Bilateral foot ulcers Concern regarding osteomyelitis in bilateral feet Vascular surgery consulted.  Evaluation for peripheral arterial disease with angiogram.  Waiting to be scheduled  *Diabetes mellitus.  Sliding scale insulin.  *Hypertension.  Continue medications.  *History of DVT.  On Eliquis.  All the records are reviewed and case discussed with Care Management/Social Worker Management plans discussed  with the patient, family and they are in agreement.  CODE STATUS: FULL CODE  DVT Prophylaxis: SCDs  TOTAL TIME TAKING CARE OF THIS PATIENT: 35 minutes.   POSSIBLE D/C IN 1-2 DAYS, DEPENDING ON CLINICAL CONDITION.  Molinda Bailiff Etana Beets M.D on 07/08/2017 at 11:26 AM  Between 7am to 6pm - Pager - [phone]  After 6pm go to [email or web address] - password [email or web address] [facility]  [facility] [place] [facility]  Office  [phone]  CC: Primary care physician; Smitty Cords, DO  Note: This dictation was prepared with Dragon dictation along with smaller phrase technology. Any transcriptional errors that result from this process are unintentional.

## 2017-07-08 NOTE — Progress Notes (Signed)
At change of shift in room rounds, pt refusing placement of new IV access.  Pt repeatedly asks why the IV in his right arm was removed a few days ago.  Explained to pt that I don't know, but it is probably because it didn't work.  Explained that pt needs a new IV in order to get antibiotics.  Pt refusing IV.  Informed pt that he would not get the recommended treatment without an IV.  Pt continues to argue.  Day shift nurse Selena Batten spoke with Dr Katheren Shams and she will put in new orders for oral antibiotic treatment. Henriette Combs RN

## 2017-07-09 LAB — GLUCOSE, CAPILLARY
GLUCOSE-CAPILLARY: 93 mg/dL (ref 65–99)
Glucose-Capillary: 102 mg/dL — ABNORMAL HIGH (ref 65–99)
Glucose-Capillary: 133 mg/dL — ABNORMAL HIGH (ref 65–99)

## 2017-07-09 MED ORDER — DULOXETINE HCL 30 MG PO CPEP
30.0000 mg | ORAL_CAPSULE | Freq: Every day | ORAL | Status: DC
  Administered 2017-07-09 – 2017-07-10 (×2): 30 mg via ORAL
  Filled 2017-07-09: qty 1

## 2017-07-09 MED ORDER — INSULIN ASPART 100 UNIT/ML ~~LOC~~ SOLN
0.0000 [IU] | Freq: Two times a day (BID) | SUBCUTANEOUS | Status: DC
  Administered 2017-07-09 – 2017-07-10 (×2): 1 [IU] via SUBCUTANEOUS
  Filled 2017-07-09 (×2): qty 1

## 2017-07-09 MED ORDER — SODIUM CHLORIDE 0.9% FLUSH
3.0000 mL | INTRAVENOUS | Status: DC | PRN
  Administered 2017-07-09 – 2017-07-11 (×6): 3 mL via INTRAVENOUS
  Filled 2017-07-09 (×5): qty 3

## 2017-07-09 MED ORDER — AMITRIPTYLINE HCL 25 MG PO TABS
25.0000 mg | ORAL_TABLET | Freq: Every day | ORAL | Status: DC
  Administered 2017-07-09: 21:00:00 25 mg via ORAL
  Filled 2017-07-09: qty 1

## 2017-07-09 NOTE — Progress Notes (Addendum)
On rounds, pt reports pain of 10/10.  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  Pt takes neurontin 600 mg 3 times a day.  Pt states that "no one is listening to me and the doctors are stupid." I gave the patient his Ultram and he states it hasn't worked since he has been here.  Spoke with Dr Katheren Shams and received verbal orders for additional medication to treat chronic pain to legs. Henriette Combs RN

## 2017-07-09 NOTE — Progress Notes (Signed)
[facility] -  at [facility]   PATIENT NAME: [name]    MR#:  [id number]  DATE OF BIRTH:  Jan 06, 1935  SUBJECTIVE: Patient complains of diffuse joint pains, body aches.  Also complains of feeling cold.  CHIEF COMPLAINT:   Chief Complaint  Patient presents with  . Leg Swelling   Bilateral lower committee edema and redness still present but slowly improving.  REVIEW OF SYSTEMS:    Review of Systems  Constitutional: Negative for chills and fever.  HENT: Negative for sore throat.   Eyes: Negative for blurred vision, double vision and pain.  Respiratory: Negative for cough, hemoptysis, shortness of breath and wheezing.   Cardiovascular: Positive for leg swelling. Negative for chest pain, palpitations and orthopnea.  Gastrointestinal: Negative for abdominal pain, constipation, diarrhea, heartburn, nausea and vomiting.  Genitourinary: Negative for dysuria and hematuria.  Musculoskeletal: Positive for joint pain. Negative for back pain.  Skin: Negative for rash.  Neurological: Negative for sensory change, speech change, focal weakness and headaches.  Endo/Heme/Allergies: Does not bruise/bleed easily.  Psychiatric/Behavioral: Negative for depression. The patient is not nervous/anxious.     DRUG ALLERGIES:   Allergies  Allergen Reactions  . Oxycodone-Acetaminophen Other (See Comments)    Causes the pt to be very irritable, says he can take this  . Percocet [Oxycodone-Acetaminophen] Other (See Comments)    Reaction:  Irritability     VITALS:  Blood pressure 133/66, pulse 64, temperature 98.4 F (36.9 C), temperature source Oral, resp. rate 20, height 6' (1.829 m), weight 95.2 kg (209 lb 14.1 oz), SpO2 92 %.  PHYSICAL EXAMINATION:   Physical Exam  GENERAL:  83 y.o.-year-old patient lying in the bed with no acute distress.  EYES: Pupils equal, round, reactive to light and accommodation. No scleral icterus. Extraocular muscles intact.  HEENT: Head  atraumatic, normocephalic. Oropharynx and nasopharynx clear.  NECK:  Supple, no jugular venous distention. No thyroid enlargement, no tenderness.  LUNGS: Normal breath sounds bilaterally, no wheezing, rales, rhonchi. No use of accessory muscles of respiration.  CARDIOVASCULAR: S1, S2 normal. No murmurs, rubs, or gallops.  ABDOMEN: Soft, nontender, nondistended. Bowel sounds present. No organomegaly or mass.  EXTREMITIES: Bilateral lower extremity edema with erythema extending up to the knee NEUROLOGIC: Cranial nerves II through XII are intact. No focal Motor or sensory deficits b/l.   PSYCHIATRIC: The patient is alert and oriented x 3.  SKIN: Bilateral foot ulcers  LABORATORY PANEL:   CBC Recent Labs  Lab 07/07/17 0525  WBC 6.4  HGB 10.4*  HCT 30.7*  PLT 309   ------------------------------------------------------------------------------------------------------------------ Chemistries  Recent Labs  Lab 07/06/17 0816 07/07/17 0525  NA 134* 135  K 3.5 3.8  CL 107 106  CO2 20* 22  GLUCOSE 144* 158*  BUN 15 14  CREATININE 1.26* 1.14  CALCIUM 8.7* 8.2*  AST 32  --   ALT 17  --   ALKPHOS 72  --   BILITOT 0.6  --    ------------------------------------------------------------------------------------------------------------------  Cardiac Enzymes No results for input(s): TROPONINI in the last 168 hours. ------------------------------------------------------------------------------------------------------------------  RADIOLOGY:  No results found.   ASSESSMENT AND PLAN:   * Bilateral leg cellulitis.  On IV vancomycin and cefepime.  Extensive areas of cellulitis.  Slowly improving, patient also is on Zyvox. Patient seen by podiatry. *Bilateral foot ulcers Concern regarding osteomyelitis in bilateral feet Vascular surgery consulted.  Evaluation for peripheral arterial disease with angiogram.  Right  legangiogram early next week, left leg angiogram can be done as  an  outpatient.*  Diabetes mellitus.  Sliding scale insulin.  Blood sugar controlled  *Hypertension.  Continue medications.  *History of DVT.  On Eliquis.  All the records are reviewed and case discussed with Care Management/Social Worker Management plans discussed with the patient, family and they are in agreement.  CODE STATUS: FULL CODE  DVT Prophylaxis: SCDs  TOTAL TIME TAKING CARE OF THIS PATIENT: 35 minutes.   POSSIBLE D/C IN 1-2 DAYS, DEPENDING ON CLINICAL CONDITION.  Katha Hamming M.D on 07/09/2017 at 12:47 PM  Between 7am to 6pm - Pager - [phone]  After 6pm go to [email or web address] - password [email or web address] [facility]  [facility] [place] [facility]  Office  [phone]  CC: Primary care physician; Smitty Cords, DO  Note: This dictation was prepared with Dragon dictation along with smaller phrase technology. Any transcriptional errors that result from this process are unintentional.

## 2017-07-09 NOTE — Progress Notes (Signed)
Pt agreed to restart Saline lock with IV antibiotics continued with pharmacy consult done in regards to po zyvox orders/administration. Tramadol effective for pain in LE's and elevation of legs. Pt has rapid speech and flighty ideas with limited ability to [employer] education. Up in chair x 1 hour and tolerated well. Dsgs changed to bilateral foot ulcers with dry bulky gauzewarpped with kerlix then taped in place with pt tolerating well.

## 2017-07-09 NOTE — Progress Notes (Signed)
Pharmacy Antibiotic Note  [name] is a 83 y.o. male admitted on 07/06/2017 with cellulitis.  Pharmacy has been consulted for Vancomycin and Cefepime dosing.  Plan: Vancomycin  IV every 18 hours.  Goal trough 15-20 mcg/mL. Cefepime 2g IV q12h   05/25 @ 0648 Patient's IV was taken out during day shift on 05/24. Spoke to RN that IV was taken out d/t possible occlusion. As of this time a new IV was attempted to be placed, but patient is refusing. Currently patient is on PO Zyvox -- will f/u on further therapy.  Height: 6' (182.9 cm) Weight: 209 lb 14.1 oz (95.2 kg) IBW/kg (Calculated) : 77.6  Temp (24hrs), Avg:98.2 F (36.8 C), Min:97.8 F (36.6 C), Max:98.5 F (36.9 C)  Recent Labs  Lab 07/06/17 0816 07/06/17 0914 07/07/17 0525 07/08/17 2246  WBC 7.7  --  6.4  --   CREATININE 1.26*  --  1.14  --   LATICACIDVEN  --  1.2  --   --   VANCOTROUGH  --   --   --  13*    Estimated Creatinine Clearance: 61.8 mL/min (by C-G formula based on SCr of 1.14 mg/dL).    Allergies  Allergen Reactions  . Oxycodone-Acetaminophen Other (See Comments)    Causes the pt to be very irritable, says he can take this  . Percocet [Oxycodone-Acetaminophen] Other (See Comments)    Reaction:  Irritability     Antimicrobials this admission: Zosyn 5/22 x 1 in ED Vancomycin 5/22 >>  Cefepime 5/22 >>  Thank you for allowing pharmacy to be a part of this patient's care.  Thomasene Ripple, PharmD, BCPS Clinical Pharmacist 07/09/2017

## 2017-07-10 DIAGNOSIS — S069X9S Unspecified intracranial injury with loss of consciousness of unspecified duration, sequela: Secondary | ICD-10-CM

## 2017-07-10 DIAGNOSIS — F0151 Vascular dementia with behavioral disturbance: Secondary | ICD-10-CM

## 2017-07-10 LAB — GLUCOSE, CAPILLARY
Glucose-Capillary: 81 mg/dL (ref 65–99)
Glucose-Capillary: 136 mg/dL — ABNORMAL HIGH (ref 65–99)
Glucose-Capillary: 103 mg/dL — ABNORMAL HIGH (ref 65–99)

## 2017-07-10 LAB — AEROBIC CULTURE  (SUPERFICIAL SPECIMEN): SPECIAL REQUESTS: NORMAL

## 2017-07-10 LAB — AEROBIC CULTURE W GRAM STAIN (SUPERFICIAL SPECIMEN)

## 2017-07-10 LAB — VANCOMYCIN, RANDOM: Vancomycin Rm: 13

## 2017-07-10 MED ORDER — VANCOMYCIN HCL 10 G IV SOLR
1250.0000 mg | Freq: Two times a day (BID) | INTRAVENOUS | Status: DC
  Administered 2017-07-10 – 2017-07-13 (×7): 1250 mg via INTRAVENOUS
  Filled 2017-07-10 (×10): qty 1250

## 2017-07-10 NOTE — Progress Notes (Signed)
[facility] - [place] at [facility]   PATIENT NAME: [name]    MR#:  [id number]  DATE OF BIRTH:  12-16-1934  SUBJECTIVE: Complains of of nausea, received a Elavil, Cymbalta last night  CHIEF COMPLAINT:   Chief Complaint  Patient presents with  . Leg Swelling   Bilateral lower leg edema and redness still present but slowly improving.  REVIEW OF SYSTEMS:    Review of Systems  Constitutional: Negative for chills and fever.  HENT: Negative for sore throat.   Eyes: Negative for blurred vision, double vision and pain.  Respiratory: Negative for cough, hemoptysis, shortness of breath and wheezing.   Cardiovascular: Positive for leg swelling. Negative for chest pain, palpitations and orthopnea.  Gastrointestinal: Positive for nausea. Negative for abdominal pain, constipation, diarrhea, heartburn and vomiting.  Genitourinary: Negative for dysuria and hematuria.  Musculoskeletal: Positive for joint pain. Negative for back pain.  Skin: Negative for rash.  Neurological: Negative for sensory change, speech change, focal weakness and headaches.  Endo/Heme/Allergies: Does not bruise/bleed easily.  Psychiatric/Behavioral: Negative for depression. The patient is not nervous/anxious.     DRUG ALLERGIES:   Allergies  Allergen Reactions  . Oxycodone-Acetaminophen Other (See Comments)    Causes the pt to be very irritable, says he can take this  . Percocet [Oxycodone-Acetaminophen] Other (See Comments)    Reaction:  Irritability     VITALS:  Blood pressure 139/66, pulse 70, temperature 98.1 F (36.7 C), resp. rate 20, height 6' (1.829 m), weight 95.7 kg (210 lb 15.7 oz), SpO2 94 %.  PHYSICAL EXAMINATION:   Physical Exam  GENERAL:  83 y.o.-year-old patient lying in the bed with no acute distress.  EYES: Pupils equal, round, reactive to light and accommodation. No scleral icterus. Extraocular muscles intact.  HEENT: Head atraumatic, normocephalic. Oropharynx and  nasopharynx clear.  NECK:  Supple, no jugular venous distention. No thyroid enlargement, no tenderness.  LUNGS: Normal breath sounds bilaterally, no wheezing, rales, rhonchi. No use of accessory muscles of respiration.  CARDIOVASCULAR: S1, S2 normal. No murmurs, rubs, or gallops.  ABDOMEN: Soft, nontender, nondistended. Bowel sounds present. No organomegaly or mass.  EXTREMITIES: Bilateral lower extremity edema with erythema extending up to the knee NEUROLOGIC: Cranial nerves II through XII are intact. No focal Motor or sensory deficits b/l.   PSYCHIATRIC: The patient is alert and oriented x 3.  SKIN: Bilateral foot ulcers  LABORATORY PANEL:   CBC Recent Labs  Lab 07/07/17 0525  WBC 6.4  HGB 10.4*  HCT 30.7*  PLT 309   ------------------------------------------------------------------------------------------------------------------ Chemistries  Recent Labs  Lab 07/06/17 0816 07/07/17 0525  NA 134* 135  K 3.5 3.8  CL 107 106  CO2 20* 22  GLUCOSE 144* 158*  BUN 15 14  CREATININE 1.26* 1.14  CALCIUM 8.7* 8.2*  AST 32  --   ALT 17  --   ALKPHOS 72  --   BILITOT 0.6  --    ------------------------------------------------------------------------------------------------------------------  Cardiac Enzymes No results for input(s): TROPONINI in the last 168 hours. ------------------------------------------------------------------------------------------------------------------  RADIOLOGY:  No results found.   ASSESSMENT AND PLAN:   * Bilateral leg cellulitis.  On IV vancomycin and cefepime.  Extensive areas of cellulitis.  Slowly improving, patient also is on Zyvox. Patient seen by podiatry. Vascular surgery consulted.  Evaluation for peripheral arterial disease with angiogram.  Right  Leg angiogram early next week, left leg angiogram can be done as an outpatient.*  Diabetes mellitus.  Sliding scale insulin.  Blood sugar controlled  *  Hypertension.  Continue  medications.  *History of DVT.  On Eliquis. History of CAD: Stable, history of pacemaker also. Depression: Started on Elavil, Cymbalta since last ngiht, but patient is very nauseous today so I discontinued the Elavil, Cymbalta.  Request psych consult  All the records are reviewed and case discussed with Care Management/Social Worker Management plans discussed with the patient, family and they are in agreement.  CODE STATUS: FULL CODE  DVT Prophylaxis: SCDs  TOTAL TIME TAKING CARE OF THIS PATIENT: 35 minutes.   POSSIBLE D/C IN 1-2 DAYS, DEPENDING ON CLINICAL CONDITION.  Katha Hamming M.D on 07/10/2017 at 12:22 PM  Between 7am to 6pm - Pager - [phone]  After 6pm go to [email or web address] - password [email or web address] [facility]  [facility] [place] [facility]  Office  [phone]  CC: Primary care physician; Smitty Cords, DO  Note: This dictation was prepared with Dragon dictation along with smaller phrase technology. Any transcriptional errors that result from this process are unintentional.

## 2017-07-10 NOTE — Consult Note (Signed)
[facility] Face-to-Face Psychiatry Consult   Reason for Consult: Consult for 83 year old man with multiple medical problems.  Question about "depression". 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 Cellulitis, leg [L03.119] 07/06/2017  . Right knee pain [M25.561] 06/02/2017  . Post-traumatic osteoarthritis of multiple joints [M15.3] 04/28/2017  . Abnormality of plasma protein [R77.9] 03/25/2017  . Elevated beta-2 microglobulin [R79.89] 03/25/2017  . Right leg DVT (HCC) [I82.401] 03/15/2017  . GERD (gastroesophageal reflux disease) [K21.9] 03/10/2017  . Hyperlipidemia, unspecified [E78.5] 03/10/2017  . Peripheral arterial occlusive disease (HCC) [I77.9] 11/24/2016  . Bilateral lower extremity edema [R60.0] 10/19/2016  . Congestive heart failure (CHF) (HCC) [I50.9] 06/14/2016  . Medication monitoring encounter [Z51.81] 06/14/2016  . Hyperthyroidism [E05.90] 12/04/2015  . Unsteady gait [R26.81] 12/04/2015  . 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 Abnormal serum protein electrophoresis [R77.8] 12/05/2014  . Pressure ulcer [L89.90] 11/15/2014  . Hyperproteinemia [E88.09] 11/02/2014  . Hypoalbuminemia [E88.09] 11/02/2014  . Arthritis [M19.90] 10/23/2014  . Arteriosclerosis of coronary artery [I25.10] 10/23/2014  . Type 2 diabetes, controlled, with neuropathy (HCC) [E11.40] 10/23/2014  . Amputation of finger of left hand [S68.119A] 10/23/2014  . History of pulmonary embolism [Z86.711] 10/23/2014  . Literacy problem [Z55.0] 10/23/2014  . Umbilical hernia without obstruction or gangrene [K42.9] 10/23/2014  . Peripheral  neuropathy [G62.9] 10/23/2014  . History of osteomyelitis [Z87.39] 10/23/2014  . Artificial cardiac pacemaker [Z95.0] 08/10/2013  . H/O coronary artery bypass surgery [Z95.1] 08/10/2013  . GIB (gastrointestinal bleeding) [K92.2] 07/18/2013  . Benign prostatic hyperplasia with urinary obstruction [N40.1, N13.8] 09/27/2012  . Abnormal prostate specific antigen [R97.20] 09/27/2012  . Benign localized hyperplasia of prostate with urinary obstruction and other lower urinary tract symptoms (LUTS)(600.21) [N40.1] 09/27/2012  . Cardiomyopathy (HCC) [I42.9] 08/09/2011    Total Time spent with patient: 1 hour  Subjective:   HARLES EVETTS is a 83 y.o. male patient admitted with "my feet hurt".  HPI: Patient seen chart reviewed.  Patient seen once previously by me in consultation.  83 year old man with multiple medical problems currently in the hospital for pain in his feet and hands.  Reportedly with diabetic foot ulcers.  Patient was cooperative with the interview and appeared to be trying to be forthcoming.  Patient states that he lives with his grandson.  When he is feeling okay he feels like his life has reasonable quality.  Mostly watches television during the day.  Patient says sometimes his mood gets bad when he thinks about how people in his family have treated him poorly.  This is actually exactly the same thing he said to me a couple years ago when I talked to him before.  He denies that he feels sad and depressed most of the time.  He says he sleeps okay and eats okay.  Denies any suicidal thoughts.  Denies any psychotic symptoms.  Does not appear to be on any psychiatric medicine outside the hospital.  Patient says he does not feel hopeless about his condition he feels like he has a good life with things to look forward to.  Social history: Evidently lives with his grandson.  He feels like he is adequately taken care of although he bemoans the fact that several  people in his family ignore  him.  Medical history: Patient has diabetes gout foot ulcers.  Most significantly probably to his long-term mental status he has a history of traumatic brain injuries twice previously in his life with sustained cognitive changes.  Substance abuse history: None  Past Psychiatric History: Patient was seen once previously by me and at that time presented pretty much the same way.  Did not report any mood symptoms or psychotic symptoms.  He has confusion and sometimes a tendency to repeat himself and be a little bit grumpy as a result of his brain injury.  There is no history of psychiatric hospitalizations no history of suicidality no clear support that I can find for a major depression.  No previous hospitalizations.  Risk to Self: Is patient at risk for suicide?: No Risk to Others:   Prior Inpatient Therapy:   Prior Outpatient Therapy:    Past Medical History:  Past Medical History:  Diagnosis Date  . Arthritis   . CAD (coronary artery disease)   . Congestive heart failure (CHF) (HCC) 06/14/2016   LVEF 40-45% 2016 echo; Dr. Darrold Junker  . COPD (chronic obstructive pulmonary disease) (HCC)   . Dementia   . Depression   . Diabetes mellitus without complication (HCC)   . Family history of adverse reaction to anesthesia    mom seizures   . GERD (gastroesophageal reflux disease)   . Heart disease   . History of blood clots in legs   . History of ischemic cardiomyopathy   . 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 Hyperlipidemia   . Hypertension   . Literacy level of illiterate   . Osteomyelitis (HCC)   . PN (peripheral neuropathy)   . Psoriasis     Past Surgical History:  Procedure Laterality Date  . amputation 4th finger Left   . APPENDECTOMY    . CLAVICLE SURGERY  2012   open reduction and internal fixation of left clavicle  . CORONARY ARTERY BYPASS GRAFT  2002  . FOOT SURGERY    . IMPLANTABLE CARDIOVERTER DEFIBRILLATOR (ICD) GENERATOR CHANGE N/A 12/03/2015   Procedure: ICD  GENERATOR CHANGE;  Surgeon: Marcina Millard, MD;  Location: [facility] ORS;  Service: Cardiovascular;  Laterality: N/A;  . PACEMAKER INSERTION     Family History:  Family History  Problem Relation Age of Onset  . Cancer Mother        spine  . Hypertension Mother   . Mental illness Mother   . Cancer Father        lung  . Asthma Father   . Allergic rhinitis Father   . Arthritis Father   . Tuberculosis Father   . Heart disease Father   . Gout Brother   . Allergic rhinitis Brother    Family Psychiatric  History: None known Social History:  Social History   Substance and Sexual Activity  Alcohol Use No     Social History   Substance and Sexual Activity  Drug Use No    Social History   Socioeconomic History  . Marital status: Divorced    Spouse name: Not on file  . Number of children: Not on file  . Years of education: Not on file  . Highest education level: Not on file  Occupational History  . Not on file  Social Needs  . Financial resource strain: Not on file  . Food insecurity:    Worry: Not on file    Inability: Not on file  . Transportation needs:  Medical: Not on file    Non-medical: Not on file  Tobacco Use  . Smoking status: Former Smoker    Packs/day: 0.25    Years: 10.00    Pack years: 2.50    Types: Cigarettes, Cigars    Last attempt to quit: 10/18/1979    Years since quitting: 37.7  . Smokeless tobacco: Never Used  Substance and Sexual Activity  . Alcohol use: No  . Drug use: No  . Sexual activity: Yes  Lifestyle  . Physical activity:    Days per week: Not on file    Minutes per session: Not on file  . Stress: Not on file  Relationships  . Social connections:    Talks on phone: Not on file    Gets together: Not on file    Attends religious service: Not on file    Active member of club or organization: Not on file    Attends meetings of clubs or organizations: Not on file    Relationship status: Not on file  Other Topics Concern  . Not  on file  Social History Narrative  . Not on file   Additional Social History:    Allergies:   Allergies  Allergen Reactions  . Oxycodone-Acetaminophen Other (See Comments)    Causes the pt to be very irritable, says he can take this  . Percocet [Oxycodone-Acetaminophen] Other (See Comments)    Reaction:  Irritability     Labs:  Results for orders placed or performed during the hospital encounter of 07/06/17 (from the past 48 hour(s))  Glucose, capillary     Status: Abnormal   Collection Time: 07/08/17  5:29 PM  Result Value Ref Range   Glucose-Capillary 105 (H) 65 - 99 mg/dL  Glucose, capillary     Status: Abnormal   Collection Time: 07/08/17  9:13 PM  Result Value Ref Range   Glucose-Capillary 122 (H) 65 - 99 mg/dL  Vancomycin, trough     Status: Abnormal   Collection Time: 07/08/17 10:46 PM  Result Value Ref Range   Vancomycin Tr 13 (L) 15 - 20 ug/mL    Comment: Performed at [facility], [address] [place], [place] [address]  Glucose, capillary     Status: Abnormal   Collection Time: 07/09/17  7:37 AM  Result Value Ref Range   Glucose-Capillary 102 (H) 65 - 99 mg/dL  Glucose, capillary     Status: None   Collection Time: 07/09/17 11:40 AM  Result Value Ref Range   Glucose-Capillary 93 65 - 99 mg/dL  Glucose, capillary     Status: Abnormal   Collection Time: 07/09/17  4:41 PM  Result Value Ref Range   Glucose-Capillary 133 (H) 65 - 99 mg/dL  Vancomycin, random     Status: None   Collection Time: 07/10/17  4:19 AM  Result Value Ref Range   Vancomycin Rm 13     Comment:        Random Vancomycin therapeutic range is dependent on dosage and time of specimen collection. A peak range is 20.0-40.0 ug/mL A trough range is 5.0-15.0 ug/mL        Performed at [facility], [address] [place], [place] [address]   Glucose, capillary     Status: None   Collection Time: 07/10/17  8:07 AM  Result Value Ref Range   Glucose-Capillary 81 65 - 99  mg/dL    Current Facility-Administered Medications  Medication Dose Route Frequency Provider Last Rate Last Dose  .  acetaminophen (TYLENOL) tablet 650 mg  650 mg Oral Q6H PRN Milagros Loll, MD   650 mg at 07/08/17 2105   Or  . acetaminophen (TYLENOL) suppository 650 mg  650 mg Rectal Q6H PRN Sudini, Wardell Heath, MD      . albuterol (PROVENTIL) (2.5 MG/3ML) 0.083% nebulizer solution 2.5 mg  2.5 mg Nebulization Q2H PRN Sudini, Srikar, MD      . allopurinol (ZYLOPRIM) tablet 100 mg  100 mg Oral Daily Milagros Loll, MD   100 mg at 07/10/17 0854  . apixaban (ELIQUIS) tablet 5 mg  5 mg Oral BID Milagros Loll, MD   5 mg at 07/10/17 0845  . aspirin EC tablet 81 mg  81 mg Oral Daily Milagros Loll, MD   81 mg at 07/10/17 0845  . atorvastatin (LIPITOR) tablet 40 mg  40 mg Oral QHS Milagros Loll, MD   40 mg at 07/09/17 2053  . bisoprolol (ZEBETA) tablet 5 mg  5 mg Oral Daily Milagros Loll, MD   5 mg at 07/10/17 0854  . ceFEPIme (MAXIPIME) 2 g in sodium chloride 0.9 % 100 mL IVPB  2 g Intravenous Q12H Milagros Loll, MD   Stopped at 07/10/17 0945  . furosemide (LASIX) tablet 20 mg  20 mg Oral Daily Milagros Loll, MD   20 mg at 07/10/17 0845  . gabapentin (NEURONTIN) capsule 600 mg  600 mg Oral TID Milagros Loll, MD   600 mg at 07/10/17 0843  . insulin aspart (novoLOG) injection 0-5 Units  0-5 Units Subcutaneous QHS Sudini, Srikar, MD      . insulin aspart (novoLOG) injection 0-9 Units  0-9 Units Subcutaneous BID WC Katha Hamming, MD   1 Units at 07/09/17 1647  . ondansetron (ZOFRAN) tablet 4 mg  4 mg Oral Q6H PRN Milagros Loll, MD       Or  . ondansetron (ZOFRAN) injection 4 mg  4 mg Intravenous Q6H PRN Sudini, Srikar, MD      . pantoprazole (PROTONIX) EC tablet 40 mg  40 mg Oral Daily Milagros Loll, MD   40 mg at 07/10/17 0843  . polyethylene glycol (MIRALAX / GLYCOLAX) packet 17 g  17 g Oral Daily PRN Sudini, Srikar, MD      . potassium chloride (K-DUR,KLOR-CON) CR tablet 10 mEq  10 mEq Oral  Daily Milagros Loll, MD   10 mEq at 07/10/17 0846  . sodium chloride flush (NS) 0.9 % injection 3 mL  3 mL Intravenous PRN Katha Hamming, MD   3 mL at 07/10/17 0946  . traMADol (ULTRAM) tablet 50 mg  50 mg Oral Q6H PRN Milagros Loll, MD   50 mg at 07/10/17 0843  . vancomycin (VANCOCIN) 1,250 mg in sodium chloride 0.9 % 250 mL IVPB  1,250 mg Intravenous Q12H Katha Hamming, MD   Stopped at 07/10/17 [phone]    Musculoskeletal: Strength & Muscle Tone: decreased Gait & Station: unable to stand Patient leans: N/A  Psychiatric Specialty Exam: Physical Exam  Nursing note and vitals reviewed. Constitutional: He appears well-developed and well-nourished.  HENT:  Head: Normocephalic and atraumatic.  Eyes: Pupils are equal, round, and reactive to light. Conjunctivae are normal.  Neck: Normal range of motion.  Cardiovascular: Regular rhythm and normal heart sounds.  Respiratory: Effort normal.  GI: Soft.  Musculoskeletal: Normal range of motion.  Neurological: He is alert.  Skin: Skin is warm and dry.  Psychiatric: He has a normal mood and affect. His behavior is normal. Judgment and thought content normal. His speech  is slurred. Cognition and memory are impaired.    Review of Systems  Constitutional: Negative.   HENT: Negative.   Eyes: Negative.   Respiratory: Negative.   Cardiovascular: Negative.   Gastrointestinal: Negative.   Musculoskeletal: Negative.   Skin: Negative.   Neurological: Negative.   Psychiatric/Behavioral: Negative.     Blood pressure 139/66, pulse 70, temperature 98.1 F (36.7 C), resp. rate 20, height 6' (1.829 m), weight 95.7 kg (210 lb 15.7 oz), SpO2 94 %.Body mass index is 28.61 kg/m.  General Appearance: Casual  Eye Contact:  Minimal  Speech:  Slow  Volume:  Decreased  Mood:  Euthymic  Affect:  Congruent  Thought Process:  Disorganized  Orientation:  Other:  He knows he is in the hospital and basically knows why he is here.  He is disoriented  as to the date.  Thought Content:  Rumination and Tangential  Suicidal Thoughts:  No  Homicidal Thoughts:  No  Memory:  Immediate;   Fair Recent;   Poor Remote;   Fair  Judgement:  Fair  Insight:  Fair  Psychomotor Activity:  Decreased  Concentration:  Concentration: Fair  Recall:  [employer] of Knowledge:  Fair  Language:  Fair  Akathisia:  No  Handed:  Right  AIMS (if indicated):     Assets:  Housing  ADL's:  Impaired  Cognition:  Impaired,  Mild  Sleep:        Treatment Plan Summary: Plan 83 year old man with a history of cognitive damage from traumatic brain injury and prior to that had a history of having been illiterate to begin with.  Patient is not reporting any current symptoms of depression.  No evidence of dangerousness.  No evidence of psychosis.  I do not think he requires antidepressant medicine particularly if it would add side effects or complicate his medical treatment.  I have discontinued the Cymbalta.  I do not see any need to add any other medicine at this point but I will follow as needed.  Disposition: No evidence of imminent risk to self or others at present.   Patient does not meet criteria for psychiatric inpatient admission. Supportive therapy provided about ongoing stressors.  Mordecai Rasmussen, MD 07/10/2017 3:19 PM

## 2017-07-10 NOTE — Progress Notes (Signed)
Pt reports feeling nauseated and feels "bad all over". Up to BR with large soft formed brown stool. Dr.Konidena in to see pt and updated; also advised of new med starts of elavil and cymbalta.

## 2017-07-10 NOTE — Progress Notes (Signed)
Noted slight intermittent expiratory wheeze- pt deep breathed with nonproductive cough done. . Reports he did not feel like eating lunch. Pt does not demonstrate ability to perform self care today. Slept at longer intervals with less labile moods. Dsgs changed on bilateral feet. Will continue to monitor.

## 2017-07-10 NOTE — Progress Notes (Signed)
Pharmacy Antibiotic Note  [name] is a 83 y.o. male admitted on 07/06/2017 with cellulitis.  Pharmacy has been consulted for Vancomycin and Cefepime dosing.  Plan: 05/26 @ 0400 VR 13 mcg/mL. Patient was having some IV issues so missed a vanc dose. Patient was previously on vanc 1.5g IV q18h last dose was 05/25 @ 1648. VR is a 12 hour level so patient's half life is 12 hours. Will start patient on vanc 1.25g IV q12h and will check a VT 05/27 @ 1700.  Ke 0.0558 T1/2 12 hrs Goal trough 15 - 20 mcg/mL (possible osteo)  Height: 6' (182.9 cm) Weight: 210 lb 15.7 oz (95.7 kg) IBW/kg (Calculated) : 77.6  Temp (24hrs), Avg:98.3 F (36.8 C), Min:97.8 F (36.6 C), Max:98.7 F (37.1 C)  Recent Labs  Lab 07/06/17 0816 07/06/17 0914 07/07/17 0525 07/08/17 2246 07/10/17 0419  WBC 7.7  --  6.4  --   --   CREATININE 1.26*  --  1.14  --   --   LATICACIDVEN  --  1.2  --   --   --   VANCOTROUGH  --   --   --  13*  --   VANCORANDOM  --   --   --   --  13    Estimated Creatinine Clearance: 62 mL/min (by C-G formula based on SCr of 1.14 mg/dL).    Allergies  Allergen Reactions  . Oxycodone-Acetaminophen Other (See Comments)    Causes the pt to be very irritable, says he can take this  . Percocet [Oxycodone-Acetaminophen] Other (See Comments)    Reaction:  Irritability     Antimicrobials this admission: Zosyn 5/22 x 1 in ED Vancomycin 5/22 >>  Cefepime 5/22 >>  Thank you for allowing pharmacy to be a part of this patient's care.  Thomasene Ripple, PharmD, BCPS Clinical Pharmacist 07/10/2017

## 2017-07-10 NOTE — Progress Notes (Signed)
Pt slept well most of the night after given Elavil and Cymbalta for chronic peripheral neuropathy to bilateral legs.  Henriette Combs RN

## 2017-07-11 LAB — CBC
HCT: 34.1 % — ABNORMAL LOW (ref 40.0–52.0)
Hemoglobin: 11.8 g/dL — ABNORMAL LOW (ref 13.0–18.0)
MCH: 32.6 pg (ref 26.0–34.0)
MCHC: 34.8 g/dL (ref 32.0–36.0)
MCV: 93.8 fL (ref 80.0–100.0)
Platelets: 368 10*3/uL (ref 150–440)
RBC: 3.63 MIL/uL — ABNORMAL LOW (ref 4.40–5.90)
RDW: 13.9 % (ref 11.5–14.5)
WBC: 9.2 10*3/uL (ref 3.8–10.6)

## 2017-07-11 LAB — CULTURE, BLOOD (ROUTINE X 2)
CULTURE: NO GROWTH
CULTURE: NO GROWTH
SPECIAL REQUESTS: ADEQUATE
SPECIAL REQUESTS: ADEQUATE

## 2017-07-11 LAB — AEROBIC CULTURE W GRAM STAIN (SUPERFICIAL SPECIMEN): Special Requests: NORMAL

## 2017-07-11 LAB — CREATININE, SERUM
CREATININE: 0.94 mg/dL (ref 0.61–1.24)
GFR calc Af Amer: >60 mL/min (ref >60)

## 2017-07-11 LAB — AEROBIC CULTURE  (SUPERFICIAL SPECIMEN)

## 2017-07-11 LAB — VANCOMYCIN, TROUGH: Vancomycin Tr: 15 ug/mL (ref 15–20)

## 2017-07-11 LAB — GLUCOSE, CAPILLARY: Glucose-Capillary: 152 mg/dL — ABNORMAL HIGH (ref 65–99)

## 2017-07-11 NOTE — Progress Notes (Signed)
Will make patient NPO tonight in case angio can be done tomorrow.  [name]

## 2017-07-11 NOTE — Progress Notes (Signed)
Moods less labile today. Slept at intervals. Reports pain overall less that he describes as neuropathy type pain with gabapentin reported beneficial. Denies foot pain currently. Noted paleness surrounding left foot wound with daily dsg changes. Refused a.m. CBG. Eating adequate amounts. VSS. Extensive foot care done.

## 2017-07-11 NOTE — Care Management Important Message (Signed)
Important Message  Patient Details  Name: [name] MRN: [id number] Date of Birth: May 10, 1934   Medicare Important Message Given:  Yes    Olegario Messier A Attikus Bartoszek 07/11/2017, 12:03 PM

## 2017-07-11 NOTE — Progress Notes (Signed)
[facility] [place] [facility]   PATIENT NAME: [name]    MR#:  [id number]  DATE OF BIRTH:  [birth year]  SUBJECTIVE: Complains of of nausea, received a Elavil, Cymbalta last night  CHIEF COMPLAINT:   Chief Complaint  Patient presents with  . Leg Swelling  improving, some leg pain, possible angio tomorrow REVIEW OF SYSTEMS:    Review of Systems  Constitutional: Negative for chills and fever.  HENT: Negative for sore throat.   Eyes: Negative for blurred vision, double vision and pain.  Respiratory: Negative for cough, hemoptysis, shortness of breath and wheezing.   Cardiovascular: Positive for leg swelling. Negative for chest pain, palpitations and orthopnea.  Gastrointestinal: Positive for nausea. Negative for abdominal pain, constipation, diarrhea, heartburn and vomiting.  Genitourinary: Negative for dysuria and hematuria.  Musculoskeletal: Positive for joint pain. Negative for back pain.  Skin: Negative for rash.  Neurological: Negative for sensory change, speech change, focal weakness and headaches.  Endo/Heme/Allergies: Does not bruise/bleed easily.  Psychiatric/Behavioral: Negative for depression. The patient is not nervous/anxious.    DRUG ALLERGIES:   Allergies  Allergen Reactions  . Oxycodone-Acetaminophen Other (See Comments)    Causes the pt to be very irritable, says he can take this  . Percocet [Oxycodone-Acetaminophen] Other (See Comments)    Reaction:  Irritability     VITALS:  Blood pressure 133/65, pulse 61, temperature 99.2 F (37.3 C), temperature source Oral, resp. rate 18, height 6' (1.829 m), weight 93.6 kg (206 lb 6.4 oz), SpO2 93 %.  PHYSICAL EXAMINATION:   Physical Exam  GENERAL:  83 y.o.-year-old patient lying in the bed with no acute distress.  EYES: Pupils equal, round, reactive to light and accommodation. No scleral icterus. Extraocular muscles intact.  HEENT: Head atraumatic, normocephalic. Oropharynx and  nasopharynx clear.  NECK:  Supple, no jugular venous distention. No thyroid enlargement, no tenderness.  LUNGS: Normal breath sounds bilaterally, no wheezing, rales, rhonchi. No use of accessory muscles of respiration.  CARDIOVASCULAR: S1, S2 normal. No murmurs, rubs, or gallops.  ABDOMEN: Soft, nontender, nondistended. Bowel sounds present. No organomegaly or mass.  EXTREMITIES: Bilateral lower extremity edema with erythema extending up to the knee NEUROLOGIC: Cranial nerves II through XII are intact. No focal Motor or sensory deficits b/l.   PSYCHIATRIC: The patient is alert and oriented x 3.  SKIN: Bilateral foot ulcers  LABORATORY PANEL:   CBC Recent Labs  Lab 07/11/17 1722  WBC 9.2  HGB 11.8*  HCT 34.1*  PLT 368   ------------------------------------------------------------------------------------------------------------------ Chemistries  Recent Labs  Lab 07/06/17 0816 07/07/17 0525 07/11/17 1722  NA 134* 135  --   K 3.5 3.8  --   CL 107 106  --   CO2 20* 22  --   GLUCOSE 144* 158*  --   BUN 15 14  --   CREATININE 1.26* 1.14 0.94  CALCIUM 8.7* 8.2*  --   AST 32  --   --   ALT 17  --   --   ALKPHOS 72  --   --   BILITOT 0.6  --   --    ------------------------------------------------------------------------------------------------------------------  Cardiac Enzymes No results for input(s): TROPONINI in the last 168 hours. ------------------------------------------------------------------------------------------------------------------  RADIOLOGY:  No results found.   ASSESSMENT AND PLAN:   * Bilateral leg cellulitis.  On IV vancomycin and cefepime.  Extensive areas of cellulitis.  Slowly improving Patient seen by podiatry - no intervention recommended Vascular surgery consulted.  Evaluation for peripheral arterial  disease with angiogram.  Right  Leg angiogram tomorrow/wednesday. left leg angiogram can be done as an outpatient.*  * Diabetes mellitus.   Sliding scale insulin.  Blood sugar controlled  *Hypertension.  Continue medications.  *History of DVT.  On Eliquis. *History of CAD: Stable, history of pacemaker also. *Depression: appreciate psych input, no meds need  All the records are reviewed and case discussed with Care Management/Social Worker Management plans discussed with the patient, nursing and they are in agreement.  CODE STATUS: FULL CODE  DVT Prophylaxis: SCDs  TOTAL TIME TAKING CARE OF THIS PATIENT: 35 minutes.   POSSIBLE D/C IN 1-2 DAYS, DEPENDING ON CLINICAL CONDITION.  Delfino Lovett M.D on 07/11/2017 at 10:36 PM  Between 7am to 6pm - Pager - [phone]  After 6pm go to [email or web address] - password [email or web address]  [facility]  Office  [phone]  CC: Primary care physician; Smitty Cords, DO  Note: This dictation was prepared with Dragon dictation along with smaller phrase technology. Any transcriptional errors that result from this process are unintentional.

## 2017-07-11 NOTE — Progress Notes (Signed)
Pt keeps stating that he is getting a cold here.  Pt spit a small amount of phlegm with a few flecks of blood in basin but continues to say that he threw up blood.  Unable to reason, instruct or teach pt.  Pt resistant to all comments and suggestions. Henriette Combs RN

## 2017-07-11 NOTE — Plan of Care (Signed)
  Problem: Education: Goal: Knowledge of General Education information will improve Outcome: Progressing   Problem: Clinical Measurements: Goal: Respiratory complications will improve Outcome: Progressing Goal: Cardiovascular complication will be avoided Outcome: Progressing   Problem: Activity: Goal: Risk for activity intolerance will decrease Outcome: Progressing   Problem: Nutrition: Goal: Adequate nutrition will be maintained Outcome: Progressing   Problem: Elimination: Goal: Will not experience complications related to bowel motility Outcome: Progressing Goal: Will not experience complications related to urinary retention Outcome: Progressing   Problem: Safety: Goal: Ability to remain free from injury will improve Outcome: Progressing   Problem: Skin Integrity: Goal: Risk for impaired skin integrity will decrease Outcome: Progressing

## 2017-07-11 NOTE — Progress Notes (Signed)
Pharmacy Antibiotic Note  [name] is a 83 y.o. male admitted on 07/06/2017 with cellulitis.  Pharmacy has been consulted for Vancomycin and Cefepime dosing.  Plan: 05/26 @ 0400 VR 13 mcg/mL. Patient was having some IV issues so missed a vanc dose. Patient was previously on vanc 1.5g IV q18h last dose was 05/25 @ 1648. VR is a 12 hour level so patient's half life is 12 hours. Will start patient on vanc 1.25g IV q12h and will check a VT 05/27 @ 1700.  Ke 0.0558 T1/2 12 hrs Goal trough 15 - 20 mcg/mL (possible osteo)  5/27 17:22 vancomycin trough 15 mcg/mL. Continue current regimen. Pharmacy will continue to monitor and adjust as needed to maintain vancomycin trough.  Height: 6' (182.9 cm) Weight: 206 lb 6.4 oz (93.6 kg) IBW/kg (Calculated) : 77.6  Temp (24hrs), Avg:98.1 F (36.7 C), Min:97.7 F (36.5 C), Max:98.6 F (37 C)  Recent Labs  Lab 07/06/17 0816 07/06/17 0914 07/07/17 0525 07/08/17 2246 07/10/17 0419 07/11/17 1722  WBC 7.7  --  6.4  --   --  9.2  CREATININE 1.26*  --  1.14  --   --  0.94  LATICACIDVEN  --  1.2  --   --   --   --   VANCOTROUGH  --   --   --  13*  --  15  VANCORANDOM  --   --   --   --  13  --     Estimated Creatinine Clearance: 74.5 mL/min (by C-G formula based on SCr of 0.94 mg/dL).    Allergies  Allergen Reactions  . Oxycodone-Acetaminophen Other (See Comments)    Causes the pt to be very irritable, says he can take this  . Percocet [Oxycodone-Acetaminophen] Other (See Comments)    Reaction:  Irritability     Antimicrobials this admission: Zosyn 5/22 x 1 in ED Vancomycin 5/22 >>  Cefepime 5/22 >>  Thank you for allowing pharmacy to be a part of this patient's care.  Latrell Reitan A. Dahlia Bailiff, PharmD, BCPS Clinical Pharmacist 07/11/2017

## 2017-07-12 ENCOUNTER — Encounter: Payer: Self-pay | Admitting: *Deleted

## 2017-07-12 ENCOUNTER — Encounter
Admission: EM | Disposition: A | Discharge status: Home Health | Payer: Self-pay | Source: Home / Self Care | Attending: Internal Medicine

## 2017-07-12 DIAGNOSIS — I70238 Atherosclerosis of native arteries of right leg with ulceration of other part of lower right leg: Secondary | ICD-10-CM

## 2017-07-12 HISTORY — PX: PERIPHERAL VASCULAR BALLOON ANGIOPLASTY: CATH118281

## 2017-07-12 LAB — BASIC METABOLIC PANEL
ANION GAP: 9 (ref 5–15)
BUN: 13 mg/dL (ref 6–20)
CO2: 20 mmol/L — AB (ref 22–32)
Calcium: 8.9 mg/dL (ref 8.9–10.3)
Chloride: 108 mmol/L (ref 101–111)
Creatinine, Ser: 0.95 mg/dL (ref 0.61–1.24)
GFR calc Af Amer: >60 mL/min (ref >60)
GLUCOSE: 93 mg/dL (ref 65–99)
Potassium: 3.8 mmol/L (ref 3.5–5.1)
Sodium: 137 mmol/L (ref 135–145)

## 2017-07-12 LAB — CBC
HCT: 32.7 % — ABNORMAL LOW (ref 40.0–52.0)
HEMOGLOBIN: 11.4 g/dL — AB (ref 13.0–18.0)
MCH: 32.4 pg (ref 26.0–34.0)
MCHC: 34.8 g/dL (ref 32.0–36.0)
MCV: 93.1 fL (ref 80.0–100.0)
Platelets: 388 10*3/uL (ref 150–440)
RBC: 3.51 MIL/uL — ABNORMAL LOW (ref 4.40–5.90)
RDW: 14.4 % (ref 11.5–14.5)
WBC: 8.2 10*3/uL (ref 3.8–10.6)

## 2017-07-12 LAB — GLUCOSE, CAPILLARY
GLUCOSE-CAPILLARY: 89 mg/dL (ref 65–99)
GLUCOSE-CAPILLARY: 81 mg/dL (ref 65–99)
Glucose-Capillary: 87 mg/dL (ref 65–99)

## 2017-07-12 SURGERY — PERIPHERAL VASCULAR BALLOON ANGIOPLASTY
Anesthesia: Moderate Sedation | Laterality: Right

## 2017-07-12 MED ORDER — MIDAZOLAM HCL 5 MG/5ML IJ SOLN
INTRAMUSCULAR | Status: AC
  Filled 2017-07-12: qty 5

## 2017-07-12 MED ORDER — DEXTROMETHORPHAN POLISTIREX ER 30 MG/5ML PO SUER
30.0000 mg | Freq: Once | ORAL | Status: AC
  Administered 2017-07-12: 30 mg via ORAL
  Filled 2017-07-12: qty 5

## 2017-07-12 MED ORDER — SODIUM CHLORIDE 0.9% FLUSH
3.0000 mL | Freq: Two times a day (BID) | INTRAVENOUS | Status: DC
  Administered 2017-07-12: 22:00:00 3 mL via INTRAVENOUS

## 2017-07-12 MED ORDER — HEPARIN SODIUM (PORCINE) 1000 UNIT/ML IJ SOLN
INTRAMUSCULAR | Status: DC | PRN
  Administered 2017-07-12: 5000 [IU] via INTRAVENOUS

## 2017-07-12 MED ORDER — HEPARIN SODIUM (PORCINE) 1000 UNIT/ML IJ SOLN
INTRAMUSCULAR | Status: AC
Start: 2017-07-12 — End: ?
  Filled 2017-07-12: qty 1

## 2017-07-12 MED ORDER — IOPAMIDOL (ISOVUE-300) INJECTION 61%
INTRAVENOUS | Status: DC | PRN
  Administered 2017-07-12: 50 mL via INTRA_ARTERIAL

## 2017-07-12 MED ORDER — LABETALOL HCL 5 MG/ML IV SOLN: 10.0000 mg | INTRAVENOUS | Status: DC | PRN

## 2017-07-12 MED ORDER — FENTANYL CITRATE (PF) 100 MCG/2ML IJ SOLN
INTRAMUSCULAR | Status: DC | PRN
  Administered 2017-07-12 (×3): 50 ug via INTRAVENOUS

## 2017-07-12 MED ORDER — MORPHINE SULFATE (PF) 2 MG/ML IV SOLN: 2.0000 mg | INTRAVENOUS | Status: DC | PRN

## 2017-07-12 MED ORDER — SODIUM CHLORIDE 0.9 % IV SOLN: 250.0000 mL | INTRAVENOUS | Status: DC | PRN

## 2017-07-12 MED ORDER — LIDOCAINE HCL (PF) 1 % IJ SOLN
INTRAMUSCULAR | Status: AC
  Filled 2017-07-12: qty 30

## 2017-07-12 MED ORDER — FENTANYL CITRATE (PF) 100 MCG/2ML IJ SOLN
INTRAMUSCULAR | Status: AC
  Filled 2017-07-12: qty 2

## 2017-07-12 MED ORDER — HYDRALAZINE HCL 20 MG/ML IJ SOLN: 5.0000 mg | INTRAMUSCULAR | Status: DC | PRN

## 2017-07-12 MED ORDER — CEFAZOLIN SODIUM-DEXTROSE 1-4 GM/50ML-% IV SOLN
1.0000 g | INTRAVENOUS | Status: AC
Start: 2017-07-13 — End: 2017-07-12
  Administered 2017-07-12: 1 g via INTRAVENOUS
  Filled 2017-07-12: qty 50

## 2017-07-12 MED ORDER — ACETAMINOPHEN 325 MG PO TABS: 650.0000 mg | ORAL_TABLET | ORAL | Status: DC | PRN

## 2017-07-12 MED ORDER — SODIUM CHLORIDE 0.9 % IV SOLN
INTRAVENOUS | Status: AC
  Administered 2017-07-12: 18:00:00 via INTRAVENOUS

## 2017-07-12 MED ORDER — ONDANSETRON HCL 4 MG/2ML IJ SOLN: 4.0000 mg | Freq: Four times a day (QID) | INTRAMUSCULAR | Status: DC | PRN

## 2017-07-12 MED ORDER — CEFAZOLIN SODIUM-DEXTROSE 1-4 GM/50ML-% IV SOLN
INTRAVENOUS | Status: AC
  Administered 2017-07-12: 1 g via INTRAVENOUS
  Filled 2017-07-12: qty 50

## 2017-07-12 MED ORDER — GUAIFENESIN-CODEINE 100-10 MG/5ML PO SOLN
5.0000 mL | ORAL | Status: DC | PRN
  Administered 2017-07-12: 05:00:00 5 mL via ORAL
  Filled 2017-07-12: qty 5

## 2017-07-12 MED ORDER — SODIUM CHLORIDE 0.9 % IV SOLN: INTRAVENOUS | Status: DC

## 2017-07-12 MED ORDER — MIDAZOLAM HCL 2 MG/2ML IJ SOLN
INTRAMUSCULAR | Status: DC | PRN
  Administered 2017-07-12: 1 mg via INTRAVENOUS
  Administered 2017-07-12: 2 mg via INTRAVENOUS
  Administered 2017-07-12 (×2): 1 mg via INTRAVENOUS

## 2017-07-12 MED ORDER — GUAIFENESIN ER 600 MG PO TB12
600.0000 mg | ORAL_TABLET | Freq: Once | ORAL | Status: AC
  Administered 2017-07-12: 06:00:00 600 mg via ORAL
  Filled 2017-07-12: qty 1

## 2017-07-12 MED ORDER — DM-GUAIFENESIN ER 30-600 MG PO TB12
1.0000 | ORAL_TABLET | Freq: Once | ORAL | Status: DC
  Filled 2017-07-12: qty 1

## 2017-07-12 MED ORDER — SODIUM CHLORIDE 0.9% FLUSH: 3.0000 mL | INTRAVENOUS | Status: DC | PRN

## 2017-07-12 MED ORDER — HEPARIN (PORCINE) IN NACL 1000-0.9 UT/500ML-% IV SOLN
INTRAVENOUS | Status: AC
  Filled 2017-07-12: qty 1000

## 2017-07-12 SURGICAL SUPPLY — 18 items
BALLN ULTRVRSE 3X100X150 (BALLOONS) ×2
BALLOON ULTRVRSE 3X100X150 (BALLOONS) ×1 IMPLANT
CATH CXI SUPP ANG 2.6FR 150CM (CATHETERS) ×2 IMPLANT
CATH PIG 70CM (CATHETERS) ×2 IMPLANT
CATH VERT 5FR 125CM (CATHETERS) ×2 IMPLANT
DEVICE PRESTO INFLATION (MISCELLANEOUS) ×4 IMPLANT
DEVICE SAFEGUARD 24CM (GAUZE/BANDAGES/DRESSINGS) ×2 IMPLANT
DEVICE STARCLOSE SE CLOSURE (Vascular Products) ×2 IMPLANT
DEVICE TORQUE .025-.038 (MISCELLANEOUS) ×2 IMPLANT
NEEDLE ENTRY 21GA 7CM ECHOTIP (NEEDLE) ×2 IMPLANT
PACK ANGIOGRAPHY (CUSTOM PROCEDURE TRAY) ×2 IMPLANT
SET INTRO CAPELLA COAXIAL (SET/KITS/TRAYS/PACK) ×2 IMPLANT
SHEATH BRITE TIP 5FRX11 (SHEATH) ×2 IMPLANT
SHEATH RAABE 6FR (SHEATH) ×2 IMPLANT
TUBING CONTRAST HIGH PRESS 72 (TUBING) ×2 IMPLANT
WIRE AQUATRACK .035X260CM (WIRE) ×2 IMPLANT
WIRE G V18X300CM (WIRE) ×2 IMPLANT
WIRE J 3MM .035X145CM (WIRE) ×2 IMPLANT

## 2017-07-12 NOTE — Op Note (Signed)
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 Introduction catheter into right lower extremity 3rd order catheter placement  2. Contrast injection right lower extremity for distal runoff with additional 3rd order  3. Percutaneous transluminal angioplasty to 3 mm right posterior tibial artery              4. Star close closure left common femoral arteriotomy  Anesthesia: Conscious sedation was administered under my direct supervision by the interventional radiology RN. IV Versed plus fentanyl were utilized. Continuous ECG, pulse oximetry and blood pressure was monitored throughout the entire procedure.  Conscious sedation was for a total of 53 minutes.  Sheath: 6 French Raby left common femoral artery  Contrast: 50 cc  Fluoroscopy Time: 6.1 minutes  Indications: [name] presents with increasing pain of the right lower extremity.  [name].  This suggests the patient is having limb threatening ischemia. The risks and benefits are reviewed all questions answered patient agrees to proceed.  Procedure:[name] is a 83 y.o. y.o. male who was identified and appropriate procedural time out was performed. The patient was then placed supine on the table and prepped and draped in the usual sterile fashion.   Ultrasound was placed in the sterile sleeve and the left groin was evaluated the left common femoral artery was echolucent and pulsatile indicating patency. Image was recorded for the permanent record and under real-time visualization a microneedle was inserted into the common femoral artery  followed by the microwire and then the micro-sheath. A J-wire was then advanced through the micro-sheath and a 5 [place] sheath was then inserted over a J-wire. J-wire was then advanced and a 5 French pigtail catheter was positioned at the level of T12.  AP projection of the aorta was then obtained. Pigtail catheter was repositioned to above the bifurcation and a LAO view of the pelvis was obtained. Subsequently a pigtail catheter with the stiff angle Glidewire was used to cross the aortic bifurcation the catheter wire were advanced down into the right distal external iliac artery. Oblique view of the femoral bifurcation was then obtained and subsequently the wire was reintroduced and the pigtail catheter negotiated into the SFA representing third order catheter placement. Distal runoff was then performed.  5000 units of heparin was then given and allowed to circulate and a 6 [place] Raby sheath was advanced up and over the bifurcation and positioned in the femoral artery  Straight catheter and stiff angle Glidewire were then negotiated down into the distal popliteal. Catheter was then advanced. Hand injection contrast demonstrated the tibial anatomy in detail.  The wire and catheter were then negotiated into the posterior tibial artery and the wire exchanged for a V 18 wire.  A 3 mm x 10 cm Ultraverse balloon was used to angioplasty the distal posterior tibial artery.  The inflation was for 2 minutes at 12 atm. Follow-up imaging demonstrated excellent patency with less than 5 % residual stenosis and preservation of the distal runoff.  After review of these images the sheath is pulled into the left external iliac oblique of the common femoral is obtained and a Star close device deployed. There no immediate Complications.  Findings: The abdominal aorta is opacified with a bolus injection contrast. Renal arteries are  single and patent. The aorta itself has diffuse disease but no hemodynamically significant  lesions. The common and external iliac arteries are widely patent bilaterally.  The right common femoral is widely patent as is the profunda femoris.  The SFA and popliteal demonstrate diffuse disease but there are no hemodynamically significant lesions.  The trifurcation is diseased with occlusion of the anterior tibial and peroneal artery.  The posterior tibial artery is generous in size and does fill the pedal arch.  In its distal one third centrally just above the level of the medial malleolus there is a segment of diffuse disease with multiple lesions that are greater than 70% and one focal lesion which is greater than 90%.  Following angioplasty the posterior tibial now is now widely patent and demonstrates in-line flow with less than 5% residual stenosis.  It is much improved and looks quite nice.    Summary: Successful recanalization right lower extremity for limb salvage   Disposition: Patient was taken to the recovery room in stable condition having tolerated the procedure well.  [name] 07/12/2017,4:35 PM

## 2017-07-12 NOTE — Progress Notes (Signed)
Patient complains of cough and congestion; T of 99; lungs sounded clear, diminished. Paged hospitalist. See new orders.

## 2017-07-12 NOTE — Progress Notes (Signed)
F/u foot ulcers. To angio today.  Wounds are improved.  No purulence today.    C/W wet to dry padded dressing. Wound culture with enterococcus and enterobacter sensitive to bactrim F/U with podiatry in outpt clinic in 2 weeks after d/c.

## 2017-07-12 NOTE — Plan of Care (Signed)
  Problem: Education: Goal: Knowledge of General Education information will improve Outcome: Progressing   Problem: Health Behavior/Discharge Planning: Goal: Ability to manage health-related needs will improve Outcome: Progressing   Problem: Elimination: Goal: Will not experience complications related to bowel motility Outcome: Progressing Goal: Will not experience complications related to urinary retention Outcome: Progressing   Problem: Pain Managment: Goal: General experience of comfort will improve Outcome: Progressing   Problem: Safety: Goal: Ability to remain free from injury will improve Outcome: Progressing

## 2017-07-12 NOTE — Progress Notes (Signed)
Patient refused 0700 blood glucose check.

## 2017-07-12 NOTE — Progress Notes (Signed)
[facility] - [place] at [facility]   PATIENT NAME: [name]    MR#:  [id number]  DATE OF BIRTH:  09/17/1934  SUBJECTIVE: Complains of of nausea, received a Elavil, Cymbalta last night  CHIEF COMPLAINT:   Chief Complaint  Patient presents with  . Leg Swelling  no complaints, waiting for angio today REVIEW OF SYSTEMS:    Review of Systems  Constitutional: Negative for chills and fever.  HENT: Negative for sore throat.   Eyes: Negative for blurred vision, double vision and pain.  Respiratory: Negative for cough, hemoptysis, shortness of breath and wheezing.   Cardiovascular: Positive for leg swelling. Negative for chest pain, palpitations and orthopnea.  Gastrointestinal: Positive for nausea. Negative for abdominal pain, constipation, diarrhea, heartburn and vomiting.  Genitourinary: Negative for dysuria and hematuria.  Musculoskeletal: Positive for joint pain. Negative for back pain.  Skin: Negative for rash.  Neurological: Negative for sensory change, speech change, focal weakness and headaches.  Endo/Heme/Allergies: Does not bruise/bleed easily.  Psychiatric/Behavioral: Negative for depression. The patient is not nervous/anxious.    DRUG ALLERGIES:   Allergies  Allergen Reactions  . Oxycodone-Acetaminophen Other (See Comments)    Causes the pt to be very irritable, says he can take this  . Percocet [Oxycodone-Acetaminophen] Other (See Comments)    Reaction:  Irritability     VITALS:  Blood pressure 139/64, pulse 63, temperature 97.7 F (36.5 C), temperature source Oral, resp. rate 18, height 6' (1.829 m), weight 92.2 kg (203 lb 4.8 oz), SpO2 95 %.  PHYSICAL EXAMINATION:   Physical Exam  GENERAL:  83 y.o.-year-old patient lying in the bed with no acute distress.  EYES: Pupils equal, round, reactive to light and accommodation. No scleral icterus. Extraocular muscles intact.  HEENT: Head atraumatic, normocephalic. Oropharynx and nasopharynx clear.   NECK:  Supple, no jugular venous distention. No thyroid enlargement, no tenderness.  LUNGS: Normal breath sounds bilaterally, no wheezing, rales, rhonchi. No use of accessory muscles of respiration.  CARDIOVASCULAR: S1, S2 normal. No murmurs, rubs, or gallops.  ABDOMEN: Soft, nontender, nondistended. Bowel sounds present. No organomegaly or mass.  EXTREMITIES: Bilateral lower extremity edema with erythema extending up to the knee NEUROLOGIC: Cranial nerves II through XII are intact. No focal Motor or sensory deficits b/l.   PSYCHIATRIC: The patient is alert and oriented x 3.  SKIN: Bilateral foot ulcers  LABORATORY PANEL:   CBC Recent Labs  Lab 07/12/17 0527  WBC 8.2  HGB 11.4*  HCT 32.7*  PLT 388   ------------------------------------------------------------------------------------------------------------------ Chemistries  Recent Labs  Lab 07/06/17 0816  07/12/17 0401  NA 134*   < > 137  K 3.5   < > 3.8  CL 107   < > 108  CO2 20*   < > 20*  GLUCOSE 144*   < > 93  BUN 15   < > 13  CREATININE 1.26*   < > 0.95  CALCIUM 8.7*   < > 8.9  AST 32  --   --   ALT 17  --   --   ALKPHOS 72  --   --   BILITOT 0.6  --   --    < > = values in this interval not displayed.   ------------------------------------------------------------------------------------------------------------------  Cardiac Enzymes No results for input(s): TROPONINI in the last 168 hours. ------------------------------------------------------------------------------------------------------------------  RADIOLOGY:  No results found.   ASSESSMENT AND PLAN:   * Bilateral leg cellulitis.  On IV vancomycin and cefepime.  Extensive areas of cellulitis.  Slowly improving Patient seen by podiatry - no intervention recommended - s/p angiogram with Successful recanalization right lower extremity for limb salvage - left leg angiogram can be done as an outpatient.  * Diabetes mellitus.  Sliding scale insulin.   Blood sugar controlled  *Hypertension.  Continue medications.  *History of DVT.  On Eliquis. *History of CAD: Stable, history of pacemaker also. *Depression: appreciate psych input, no meds need  All the records are reviewed and case discussed with Care Management/Social Worker Management plans discussed with the patient, nursing and they are in agreement.  CODE STATUS: FULL CODE  DVT Prophylaxis: SCDs  TOTAL TIME TAKING CARE OF THIS PATIENT: 35 minutes.   POSSIBLE D/C IN 1 DAYS, DEPENDING ON CLINICAL CONDITION.  Delfino Lovett M.D on 07/12/2017 at 8:27 PM  Between 7am to 6pm - Pager - [phone]  After 6pm go to [email or web address] - password [email or web address] [facility]  [facility] [place] [facility]  Office  [phone]  CC: Primary care physician; Smitty Cords, DO  Note: This dictation was prepared with Dragon dictation along with smaller phrase technology. Any transcriptional errors that result from this process are unintentional.

## 2017-07-13 ENCOUNTER — Encounter: Payer: Self-pay | Admitting: Vascular Surgery

## 2017-07-13 LAB — BASIC METABOLIC PANEL
Anion gap: 8 (ref 5–15)
BUN: 14 mg/dL (ref 6–20)
CHLORIDE: 110 mmol/L (ref 101–111)
CO2: 21 mmol/L — AB (ref 22–32)
CREATININE: 0.92 mg/dL (ref 0.61–1.24)
Calcium: 8.8 mg/dL — ABNORMAL LOW (ref 8.9–10.3)
GFR calc non Af Amer: >60 mL/min (ref >60)
Glucose, Bld: 103 mg/dL — ABNORMAL HIGH (ref 65–99)
Potassium: 3.9 mmol/L (ref 3.5–5.1)
Sodium: 139 mmol/L (ref 135–145)

## 2017-07-13 LAB — CBC
HEMATOCRIT: 33.8 % — AB (ref 40.0–52.0)
HEMOGLOBIN: 11.6 g/dL — AB (ref 13.0–18.0)
MCH: 32.2 pg (ref 26.0–34.0)
MCHC: 34.4 g/dL (ref 32.0–36.0)
MCV: 93.6 fL (ref 80.0–100.0)
Platelets: 416 10*3/uL (ref 150–440)
RBC: 3.61 MIL/uL — ABNORMAL LOW (ref 4.40–5.90)
RDW: 14.2 % (ref 11.5–14.5)
WBC: 8.6 10*3/uL (ref 3.8–10.6)

## 2017-07-13 LAB — GLUCOSE, CAPILLARY: GLUCOSE-CAPILLARY: 100 mg/dL — AB (ref 65–99)

## 2017-07-13 MED ORDER — GUAIFENESIN-DM 100-10 MG/5ML PO SYRP
10.0000 mL | ORAL_SOLUTION | Freq: Four times a day (QID) | ORAL | Status: DC | PRN
  Administered 2017-07-13: 05:00:00 10 mL via ORAL
  Filled 2017-07-13: qty 10

## 2017-07-13 MED ORDER — LEVOFLOXACIN 500 MG PO TABS: 500.0000 mg | ORAL_TABLET | Freq: Every day | ORAL | 0 refills | Status: AC

## 2017-07-13 MED ORDER — IBUPROFEN 600 MG PO TABS: 600.0000 mg | ORAL_TABLET | Freq: Four times a day (QID) | ORAL | 0 refills | Status: AC | PRN

## 2017-07-13 MED ORDER — AMOXICILLIN-POT CLAVULANATE 875-125 MG PO TABS: 1.0000 | ORAL_TABLET | Freq: Two times a day (BID) | ORAL | 0 refills | Status: DC

## 2017-07-13 MED ORDER — SULFAMETHOXAZOLE-TRIMETHOPRIM 800-160 MG PO TABS: 1.0000 | ORAL_TABLET | Freq: Two times a day (BID) | ORAL | 0 refills | Status: DC

## 2017-07-13 NOTE — Discharge Instructions (Signed)

## 2017-07-13 NOTE — Care Management Important Message (Signed)
Important Message  Patient Details  Name: [name] MRN: [id number] Date of Birth: Jul 25, 1934   Medicare Important Message Given:  Yes    Cosme Jacob A, RN 07/13/2017, 10:07 AM

## 2017-07-13 NOTE — Progress Notes (Addendum)
Patient discharge to home via grandson at 2215, discharge instructions given to patient by previous nurse Morrie Sheldon, RN) per patient and nurse.

## 2017-07-13 NOTE — Progress Notes (Signed)
[facility] - [place] at [facility]   PATIENT NAME: [name]    MR#:  [id number]  DATE OF BIRTH:  02-02-1935  SUBJECTIVE: Complains of of nausea, received a Elavil, Cymbalta last night  CHIEF COMPLAINT:   Chief Complaint  Patient presents with  . Leg Swelling  no complaints, wants to go home. Ambulated in hall with me without any difficulty REVIEW OF SYSTEMS:    Review of Systems  Constitutional: Negative for chills and fever.  HENT: Negative for sore throat.   Eyes: Negative for blurred vision, double vision and pain.  Respiratory: Negative for cough, hemoptysis, shortness of breath and wheezing.   Cardiovascular: Positive for leg swelling. Negative for chest pain, palpitations and orthopnea.  Gastrointestinal: Positive for nausea. Negative for abdominal pain, constipation, diarrhea, heartburn and vomiting.  Genitourinary: Negative for dysuria and hematuria.  Musculoskeletal: Positive for joint pain. Negative for back pain.  Skin: Negative for rash.  Neurological: Negative for sensory change, speech change, focal weakness and headaches.  Endo/Heme/Allergies: Does not bruise/bleed easily.  Psychiatric/Behavioral: Negative for depression. The patient is not nervous/anxious.    DRUG ALLERGIES:   Allergies  Allergen Reactions  . Oxycodone-Acetaminophen Other (See Comments)    Causes the pt to be very irritable, says he can take this  . Percocet [Oxycodone-Acetaminophen] Other (See Comments)    Reaction:  Irritability     VITALS:  Blood pressure (!) 146/77, pulse 71, temperature 98.4 F (36.9 C), temperature source Oral, resp. rate 20, height 6' (1.829 m), weight 91.2 kg (201 lb 1.6 oz), SpO2 95 %.  PHYSICAL EXAMINATION:   Physical Exam  GENERAL:  83 y.o.-year-old patient lying in the bed with no acute distress.  EYES: Pupils equal, round, reactive to light and accommodation. No scleral icterus. Extraocular muscles intact.  HEENT: Head atraumatic,  normocephalic. Oropharynx and nasopharynx clear.  NECK:  Supple, no jugular venous distention. No thyroid enlargement, no tenderness.  LUNGS: Normal breath sounds bilaterally, no wheezing, rales, rhonchi. No use of accessory muscles of respiration.  CARDIOVASCULAR: S1, S2 normal. No murmurs, rubs, or gallops.  ABDOMEN: Soft, nontender, nondistended. Bowel sounds present. No organomegaly or mass.  EXTREMITIES: Bilateral lower extremity edema with erythema extending up to the knee NEUROLOGIC: Cranial nerves II through XII are intact. No focal Motor or sensory deficits b/l.   PSYCHIATRIC: The patient is alert and oriented x 3.  SKIN: Bilateral foot ulcers  LABORATORY PANEL:   CBC Recent Labs  Lab 07/13/17 0509  WBC 8.6  HGB 11.6*  HCT 33.8*  PLT 416   ------------------------------------------------------------------------------------------------------------------ Chemistries  Recent Labs  Lab 07/13/17 0509  NA 139  K 3.9  CL 110  CO2 21*  GLUCOSE 103*  BUN 14  CREATININE 0.92  CALCIUM 8.8*   ------------------------------------------------------------------------------------------------------------------  Cardiac Enzymes No results for input(s): TROPONINI in the last 168 hours. ------------------------------------------------------------------------------------------------------------------  RADIOLOGY:  No results found.   ASSESSMENT AND PLAN:   * Bilateral leg cellulitis.  On IV vancomycin and cefepime.  Extensive areas of cellulitis.  Slowly improving Patient seen by podiatry - no intervention recommended - s/p angiogram with Successful recanalization right lower extremity for limb salvage - left leg angiogram can be done as an outpatient.  * Diabetes mellitus.  Sliding scale insulin.  Blood sugar controlled  *Hypertension.  Continue medications.  *History of DVT.  On Eliquis. *History of CAD: Stable, history of pacemaker also. *Depression: appreciate psych  input, no meds need   Ambulated in hallways without any difficulties.  Stable for D/C. All paper works have been done since morning but unfortunately unable to get in touch with family to get him home.  All the records are reviewed and case discussed with Care Management/Social Worker Management plans discussed with the patient, nursing and they are in agreement.  CODE STATUS: FULL CODE  DVT Prophylaxis: SCDs  TOTAL TIME TAKING CARE OF THIS PATIENT: 35 minutes.   POSSIBLE D/C IN 1 DAYS, DEPENDING ON CLINICAL CONDITION.  Delfino Lovett M.D on 07/13/2017 at 8:48 PM  Between 7am to 6pm - Pager - [phone]  After 6pm go to [email or web address] - password [email or web address] [facility]  [facility] [place] [facility]  Office  [phone]  CC: Primary care physician; Smitty Cords, DO  Note: This dictation was prepared with Dragon dictation along with smaller phrase technology. Any transcriptional errors that result from this process are unintentional.

## 2017-07-13 NOTE — Progress Notes (Signed)
Patient refused finger stick blood glucose check. Morning lab value of 103

## 2017-07-13 NOTE — Progress Notes (Signed)
Pt refused 0700 BS

## 2017-07-13 NOTE — Progress Notes (Signed)
Pt grandson Shylo Dillenbeck called three times this day. Phone went straight to voicemail.This nurse left two messages asking for a return phone call. This nurse also called his daughter Belicina twice, but did not get an answer. Pt to be D/C'd today if family can pick him up. Will continue to monitor.

## 2017-07-13 NOTE — Care Management Note (Addendum)
Case Management Note  Patient Details  Name: [name] MRN: [id number] Date of Birth: 1934-06-19  Subjective/Objective:    Admitted to [facility] with the diagnosis of  Cellulitis of both lower legs. Lucila [place] is in the home. Daughter is Belicina [phone]). Sees Dr. Lew Dawes in [place]. Prescriptions are filled at [facility] in [place]. [facility] in the home.  Health Care 06/04/17. Scales and Rolling walker in the home. Good appetite. States 2 falls in the last 6 months.  Discharge to home today per Dr. Sherryll Burger            Action/Plan: Will arrange Home Health/physical therapy per [facility]   Expected Discharge Date:  07/13/17               Expected Discharge Plan:     In-House Referral:     Discharge planning Services     Post Acute Care Choice:    Choice offered to:     DME Arranged:    DME Agency:     HH Arranged:   yes HH Agency:   [facility]  Status of Service:     If discussed at Long Length of Stay Meetings, dates discussed:    Additional Comments:  Gwenette Greet, RN MSN CCM Care Management [phone] 07/13/2017, 8:36 AM

## 2017-07-13 NOTE — Progress Notes (Signed)
Patient requested cough medicine without codeine. Paged hospitalist. See new orders.

## 2017-07-14 ENCOUNTER — Telehealth: Payer: Self-pay | Admitting: Family Medicine

## 2017-07-14 NOTE — Telephone Encounter (Signed)
Pt just left hospital and is requesting a home health visit.  His contact number is [phone]

## 2017-07-14 NOTE — Telephone Encounter (Signed)
Called [facility] and they will reach out to patient.

## 2017-07-15 ENCOUNTER — Telehealth: Payer: Self-pay

## 2017-07-15 NOTE — Telephone Encounter (Signed)
I have made the 2nd attempt to contact the patient or family member in charge, in order to follow up from recently being discharged from the hospital. I left a message on voicemail with date and time of hospital follow up appt.

## 2017-07-15 NOTE — Discharge Summary (Signed)
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 Arthritis   . CAD (coronary artery disease)   . Congestive heart failure (CHF) (HCC) 06/14/2016   LVEF 40-45% 2016 echo; Dr. Darrold Junker  . COPD (chronic obstructive pulmonary disease) (HCC)   . Dementia   . Depression   . Diabetes mellitus without complication (HCC)   . Family history of adverse reaction to anesthesia    mom seizures   . GERD (gastroesophageal reflux disease)   . Heart disease   . History of blood clots in legs   . History of ischemic cardiomyopathy   . 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 Hyperlipidemia   . Hypertension   . Literacy level of illiterate   . Osteomyelitis (HCC)   . PN (peripheral neuropathy)   . Psoriasis    HOSPITAL COURSE:   83 y.o. male with a known history of diabetes, hypertension, DVT, chronic lower examinee the ulcers admitted to the hospital complaining of worsening redness and pain of bilateral legs  * Bilateral leg cellulitis:  improving with Antibiotics Patient seen by podiatry - no intervention recommended, outpt f/up - s/p angiogram with Successful recanalization rightlower extremity for limb salvage - left leg angiogram can be done as an outpatient.  * Diabetes mellitus: Blood sugar controlled  *Hypertension. Continue medications.  *History of DVT. On  Eliquis.  *History of CAD: Stable, history of pacemaker also.  *Depression: seen by psych, no meds need  Ambulated in hallways without any difficulties. Stable for D/C. All paper works have been done since morning but unfortunately unable to get in touch with family to get him home.  Family picked him up very late at night after multiple attempts. DISCHARGE CONDITIONS:  stable CONSULTS OBTAINED:  Treatment Team:  Gwyneth Revels, DPM Dew, Marlow Baars, MD Clapacs, Jackquline Denmark, MD DRUG ALLERGIES:   Allergies  Allergen Reactions  . Oxycodone-Acetaminophen Other (See Comments)    Causes the pt to be very irritable, says he can take this  . Percocet [Oxycodone-Acetaminophen] Other (See Comments)    Reaction:  Irritability    DISCHARGE MEDICATIONS:   Allergies as of 07/13/2017      Reactions   Oxycodone-acetaminophen Other (See Comments)   Causes the pt to be very irritable, says he can take this   Percocet [oxycodone-acetaminophen] Other (See Comments)   Reaction:  Irritability       Medication List    STOP taking these medications   gabapentin 300 MG capsule Commonly known as:  NEURONTIN   predniSONE 10 MG (21) Tbpk tablet Commonly known as:  STERAPRED UNI-PAK 21 TAB   traMADol 50 MG tablet Commonly known as:  ULTRAM     TAKE these medications   acetaminophen 500 MG tablet Commonly known as:  TYLENOL Take 500 mg by mouth every 8 (eight) hours as needed for mild pain or fever.   allopurinol 100 MG tablet Commonly known as:  ZYLOPRIM  Take 1 tablet (100 mg total) by mouth daily.   apixaban 5 MG Tabs tablet Commonly known as:  ELIQUIS Take 1 tablet (5 mg total) by mouth 2 (two) times daily.   aspirin EC 81 MG tablet Take 1 tablet (81 mg total) by mouth daily.   atorvastatin 40 MG tablet Commonly known as:  LIPITOR Take 1 tablet (40 mg total) by mouth at bedtime.   bisoprolol 5 MG tablet Commonly known as:  ZEBETA Take 1 tablet (5 mg total) by mouth daily.     furosemide 20 MG tablet Commonly known as:  LASIX Take 1 tablet (20 mg total) by mouth daily.   ibuprofen 600 MG tablet Commonly known as:  ADVIL,MOTRIN Take 1 tablet (600 mg total) by mouth every 6 (six) hours as needed for mild pain or moderate pain. Pt takes twice a day, am/pm What changed:    medication strength  how much to take  reasons to take this   levofloxacin 500 MG tablet Commonly known as:  LEVAQUIN Take 1 tablet (500 mg total) by mouth daily for 5 days.   mupirocin cream 2 % Commonly known as:  BACTROBAN Apply topically daily.   ondansetron 4 MG tablet Commonly known as:  ZOFRAN Take 1 tablet (4 mg total) by mouth every 6 (six) hours as needed for nausea.   pantoprazole 40 MG tablet Commonly known as:  PROTONIX Take 1 tablet (40 mg total) by mouth daily.   potassium chloride 10 MEQ tablet Commonly known as:  K-DUR Take 1 tablet (10 mEq total) by mouth daily.   Wrist Brace/Right Large Misc Wear on the right wrist, cock-up wrist splint for carpal tunnel   TRUFORM STOCKINGS 20-30MMHG Misc Wear on both legs; put on first thing in the morning and remove before bed; do NOT sleep in them        DISCHARGE INSTRUCTIONS:   DIET:  Regular diet DISCHARGE CONDITION:  Good ACTIVITY:  Activity as tolerated OXYGEN:  Home Oxygen: No.  Oxygen Delivery: room air DISCHARGE LOCATION:  home with HHPT, RN - [facility] by [facility]. Resume home health  If you experience worsening of your admission symptoms, develop shortness of breath, life threatening emergency, suicidal or homicidal thoughts you must seek medical attention immediately by calling 911 or calling your MD immediately  if symptoms less severe.  You Must read complete instructions/literature along with all the possible adverse reactions/side effects for all the Medicines you take and that have been prescribed to you. Take any new Medicines after you have completely understood and accpet all the possible  adverse reactions/side effects.   Please note  You were cared for by a hospitalist during your hospital stay. If you have any questions about your discharge medications or the care you received while you were in the hospital after you are discharged, you can call the unit and asked to speak with the hospitalist on call if the hospitalist that took care of you is not available. Once you are discharged, your primary care physician will handle any further medical issues. Please note that NO REFILLS for any discharge medications will be authorized once you are discharged, as it is imperative that you return to your primary care physician (or establish a relationship with a primary care physician if you do not have one) for your aftercare needs so that they can reassess your need for medications and monitor your lab values.    On the day of Discharge:  VITAL SIGNS:  Blood pressure [name])  146/77, pulse 71, temperature 98.4 F (36.9 C), temperature source Oral, resp. rate 20, height 6' (1.829 m), weight 91.2 kg (201 lb 1.6 oz), SpO2 95 %. PHYSICAL EXAMINATION:  GENERAL:  83 y.o.-year-old patient lying in the bed with no acute distress.  EYES: Pupils equal, round, reactive to light and accommodation. No scleral icterus. Extraocular muscles intact.  HEENT: Head atraumatic, normocephalic. Oropharynx and nasopharynx clear.  NECK:  Supple, no jugular venous distention. No thyroid enlargement, no tenderness.  LUNGS: Normal breath sounds bilaterally, no wheezing, rales,rhonchi or crepitation. No use of accessory muscles of respiration.  CARDIOVASCULAR: S1, S2 normal. No murmurs, rubs, or gallops.  ABDOMEN: Soft, non-tender, non-distended. Bowel sounds present. No organomegaly or mass.  EXTREMITIES: No pedal edema, cyanosis, or clubbing.  NEUROLOGIC: Cranial nerves II through XII are intact. Muscle strength 5/5 in all extremities. Sensation intact. Gait not checked.  PSYCHIATRIC: The patient is alert and  oriented x 3.  SKIN: No obvious rash, lesion, or ulcer.  DATA REVIEW:   CBC Recent Labs  Lab 07/13/17 0509  WBC 8.6  HGB 11.6*  HCT 33.8*  PLT 416    Chemistries  Recent Labs  Lab 07/13/17 0509  NA 139  K 3.9  CL 110  CO2 21*  GLUCOSE 103*  BUN 14  CREATININE 0.92  CALCIUM 8.8*     Follow-up Information    Schnier, Latina Craver, MD. Go on 09/01/2017.   Specialties:  Vascular Surgery, Cardiology, Radiology, Vascular Surgery Why:  :00 PM Contact information: [address] [place] [address] [phone]        Smitty Cords, DO. Go on 07/20/2017.   Specialty:  Family Medicine Why:  :20 AM Contact information: [address] [place] [address] [phone]        Gwyneth Revels, DPM. Go on 07/28/2017.   Specialty:  Podiatry Why:  :00 AM Contact information: [address] [place] [address] [phone]           Management plans discussed with the patient, family and they are in agreement.  CODE STATUS: Prior   TOTAL TIME TAKING CARE OF THIS PATIENT: 45 minutes.    Delfino Lovett M.D on 07/15/2017 at 5:03 PM  Between 7am to 6pm - Pager - [phone]  After 6pm go to [email or web address] - Social research officer, government  [facility]  Office  [phone]  CC: Primary care physician; Smitty Cords, DO   Note: This dictation was prepared with Dragon dictation along with smaller phrase technology. Any transcriptional errors that result from this process are unintentional.

## 2017-07-15 NOTE — Telephone Encounter (Signed)
I have made the 1st attempt to contact the patient or family member in charge, in order to follow up from recently being discharged from the hospital. I left a message on voicemail but I will make another attempt at a different time.  

## 2017-07-18 ENCOUNTER — Other Ambulatory Visit: Payer: Self-pay | Admitting: Family Medicine

## 2017-07-18 DIAGNOSIS — K219 Gastro-esophageal reflux disease without esophagitis: Secondary | ICD-10-CM

## 2017-07-19 ENCOUNTER — Encounter: Payer: Self-pay | Admitting: Emergency Medicine

## 2017-07-19 ENCOUNTER — Telehealth: Payer: Self-pay

## 2017-07-19 ENCOUNTER — Emergency Department
Admission: EM | Admit: 2017-07-19 | Discharge: 2017-07-19 | Disposition: A | Discharge status: Home / Self Care | Payer: Medicare Other | Attending: Student in an Organized Health Care Education/Training Program | Admitting: Student in an Organized Health Care Education/Training Program

## 2017-07-19 DIAGNOSIS — S81802S Unspecified open wound, left lower leg, sequela: Secondary | ICD-10-CM

## 2017-07-19 DIAGNOSIS — J449 Chronic obstructive pulmonary disease, unspecified: Secondary | ICD-10-CM | POA: Diagnosis not present

## 2017-07-19 DIAGNOSIS — I110 Hypertensive heart disease with heart failure: Secondary | ICD-10-CM | POA: Diagnosis not present

## 2017-07-19 DIAGNOSIS — S81801D Unspecified open wound, right lower leg, subsequent encounter: Secondary | ICD-10-CM | POA: Insufficient documentation

## 2017-07-19 DIAGNOSIS — E119 Type 2 diabetes mellitus without complications: Secondary | ICD-10-CM | POA: Insufficient documentation

## 2017-07-19 DIAGNOSIS — I2510 Atherosclerotic heart disease of native coronary artery without angina pectoris: Secondary | ICD-10-CM | POA: Diagnosis not present

## 2017-07-19 DIAGNOSIS — Z79899 Other long term (current) drug therapy: Secondary | ICD-10-CM | POA: Insufficient documentation

## 2017-07-19 DIAGNOSIS — X58XXXA Exposure to other specified factors, initial encounter: Secondary | ICD-10-CM | POA: Diagnosis not present

## 2017-07-19 DIAGNOSIS — F0390 Unspecified dementia without behavioral disturbance: Secondary | ICD-10-CM | POA: Insufficient documentation

## 2017-07-19 DIAGNOSIS — Z951 Presence of aortocoronary bypass graft: Secondary | ICD-10-CM | POA: Insufficient documentation

## 2017-07-19 DIAGNOSIS — S81802D Unspecified open wound, left lower leg, subsequent encounter: Secondary | ICD-10-CM | POA: Diagnosis present

## 2017-07-19 DIAGNOSIS — E0590 Thyrotoxicosis, unspecified without thyrotoxic crisis or storm: Secondary | ICD-10-CM | POA: Insufficient documentation

## 2017-07-19 DIAGNOSIS — I509 Heart failure, unspecified: Secondary | ICD-10-CM | POA: Insufficient documentation

## 2017-07-19 DIAGNOSIS — Z7901 Long term (current) use of anticoagulants: Secondary | ICD-10-CM | POA: Insufficient documentation

## 2017-07-19 DIAGNOSIS — F329 Major depressive disorder, single episode, unspecified: Secondary | ICD-10-CM | POA: Insufficient documentation

## 2017-07-19 DIAGNOSIS — S81801S Unspecified open wound, right lower leg, sequela: Secondary | ICD-10-CM

## 2017-07-19 DIAGNOSIS — Z950 Presence of cardiac pacemaker: Secondary | ICD-10-CM | POA: Insufficient documentation

## 2017-07-19 DIAGNOSIS — Z7982 Long term (current) use of aspirin: Secondary | ICD-10-CM | POA: Insufficient documentation

## 2017-07-19 NOTE — ED Provider Notes (Signed)
[facility] Emergency Department Provider Note    First MD Initiated Contact with Patient 07/19/17 1113     (approximate)  I have reviewed the triage vital signs and the nursing notes.   HISTORY  Chief Complaint Leg Swelling    HPI [name] is a 83 y.o. male with an extensive past medical history well-known to this facility presents to the ER for evaluation of bilateral foot wounds and wants to have his left groin checked where they did percutaneous vascular intervention.  States his been compliant with his medications including antibiotics as well as Eliquis.  Patient has follow-up appointment tomorrow.  He is concerned because he does not feel that the home health nurses are coming to check on him enough but is otherwise doing well.  States that he is having family help out and he is feeling comfortable dressing and changing his own dressings.    Past Medical History:  Diagnosis Date  . Arthritis   . CAD (coronary artery disease)   . Congestive heart failure (CHF) (HCC) 06/14/2016   LVEF 40-45% 2016 echo; Dr. Darrold JunkerParaschos  . COPD (chronic obstructive pulmonary disease) (HCC)   . Dementia   . Depression   . Diabetes mellitus without complication (HCC)   . Family history of adverse reaction to anesthesia    mom seizures   . GERD (gastroesophageal reflux disease)   . Heart disease   . History of blood clots in legs   . History of ischemic cardiomyopathy   . 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 Hyperlipidemia   . Hypertension   . Literacy level of illiterate   . Osteomyelitis (HCC)   . PN (peripheral neuropathy)   . Psoriasis    Family History  Problem Relation Age of Onset  . Cancer Mother        spine  . Hypertension Mother   . Mental illness Mother   . Cancer Father        lung  . Asthma Father   . Allergic rhinitis Father   . Arthritis Father   . Tuberculosis Father   . Heart disease Father   . Gout Brother   . Allergic rhinitis  Brother    Past Surgical History:  Procedure Laterality Date  . amputation 4th finger Left   . APPENDECTOMY    . CARDIAC SURGERY    . CLAVICLE SURGERY  2012   open reduction and internal fixation of left clavicle  . CORONARY ARTERY BYPASS GRAFT  2002  . FOOT SURGERY    . IMPLANTABLE CARDIOVERTER DEFIBRILLATOR (ICD) GENERATOR CHANGE N/A 12/03/2015   Procedure: ICD GENERATOR CHANGE;  Surgeon: Marcina MillardAlexander Paraschos, MD;  Location: [facility] ORS;  Service: Cardiovascular;  Laterality: N/A;  . PACEMAKER INSERTION    . PERIPHERAL VASCULAR BALLOON ANGIOPLASTY Right 07/12/2017   Procedure: PERIPHERAL VASCULAR BALLOON ANGIOPLASTY;  Surgeon: Renford DillsSchnier, Gregory G, MD;  Location: [facility] INVASIVE CV LAB;  Service: Cardiovascular;  Laterality: Right;   Patient Active Problem List   Diagnosis Date Noted  . Cellulitis, leg 07/06/2017  . Right knee pain 06/02/2017  . Post-traumatic osteoarthritis of multiple joints 04/28/2017  . Abnormality of plasma protein 03/25/2017  . Elevated beta-2 microglobulin 03/25/2017  . Right leg DVT (HCC) 03/15/2017  . GERD (gastroesophageal reflux disease) 03/10/2017  . Hyperlipidemia, unspecified 03/10/2017  . Peripheral arterial occlusive disease (HCC) 11/24/2016  . Bilateral lower extremity edema 10/19/2016  . Congestive heart failure (CHF) (HCC) 06/14/2016  . Medication monitoring encounter 06/14/2016  . Hyperthyroidism  12/04/2015  . Unsteady gait 12/04/2015  . 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 Abnormal serum protein electrophoresis 12/05/2014  . Pressure ulcer 11/15/2014  . Hyperproteinemia 11/02/2014  . Hypoalbuminemia 11/02/2014  . Arthritis 10/23/2014  . Arteriosclerosis of coronary artery 10/23/2014  . Type 2 diabetes, controlled, with neuropathy (HCC) 10/23/2014  . Amputation of finger of left hand 10/23/2014  . History of pulmonary embolism  10/23/2014  . Literacy problem 10/23/2014  . Umbilical hernia without obstruction or gangrene 10/23/2014  . Peripheral neuropathy 10/23/2014  . History of osteomyelitis 10/23/2014  . Artificial cardiac pacemaker 08/10/2013  . H/O coronary artery bypass surgery 08/10/2013  . GIB (gastrointestinal bleeding) 07/18/2013  . Benign prostatic hyperplasia with urinary obstruction 09/27/2012  . Abnormal prostate specific antigen 09/27/2012  . Benign localized hyperplasia of prostate with urinary obstruction and other lower urinary tract symptoms (LUTS)(600.21) 09/27/2012  . Cardiomyopathy (HCC) 08/09/2011      Prior to Admission medications   Medication Sig Start Date End Date Taking? Authorizing Provider  acetaminophen (TYLENOL) 500 MG tablet Take 500 mg by mouth every 8 (eight) hours as needed for mild pain or fever.     [provider]  allopurinol (ZYLOPRIM) 100 MG tablet Take 1 tablet (100 mg total) by mouth daily. 06/05/17   Ramonita Lab, MD  apixaban (ELIQUIS) 5 MG TABS tablet Take 1 tablet (5 mg total) by mouth 2 (two) times daily. 06/29/17   Karamalegos, Netta Neat, DO  aspirin EC 81 MG tablet Take 1 tablet (81 mg total) by mouth daily. 05/11/16   Kerman Passey, MD  atorvastatin (LIPITOR) 40 MG tablet Take 1 tablet (40 mg total) by mouth at bedtime. 12/17/16   Kerman Passey, MD  bisoprolol (ZEBETA) 5 MG tablet Take 1 tablet (5 mg total) by mouth daily. 06/05/17   Ramonita Lab, MD  Elastic Bandages & Supports (TRUFORM STOCKINGS 20-30MMHG) MISC Wear on both legs; put on first thing in the morning and remove before bed; do NOT sleep in them 03/24/17   [name], Janit Bern, MD  Elastic Bandages & Supports (WRIST BRACE/RIGHT LARGE) MISC Wear on the right wrist, cock-up wrist splint for carpal tunnel 03/24/17   Kerman Passey, MD  furosemide (LASIX) 20 MG tablet Take 1 tablet (20 mg total) by mouth daily. 04/28/17   Karamalegos, Netta Neat, DO  ibuprofen (ADVIL,MOTRIN) 600 MG tablet Take 1  tablet (600 mg total) by mouth every 6 (six) hours as needed for mild pain or moderate pain. Pt takes twice a day, am/pm 07/13/17   Delfino Lovett, MD  mupirocin cream (BACTROBAN) 2 % Apply topically daily. 06/05/17   Gouru, Deanna Artis, MD  ondansetron (ZOFRAN) 4 MG tablet Take 1 tablet (4 mg total) by mouth every 6 (six) hours as needed for nausea. 06/04/17   Gouru, Deanna Artis, MD  pantoprazole (PROTONIX) 40 MG tablet Take 1 tablet (40 mg total) by mouth daily. 07/18/17   Karamalegos, Netta Neat, DO  potassium chloride (K-DUR) 10 MEQ tablet Take 1 tablet (10 mEq total) by mouth daily. 04/28/17   Smitty Cords, DO    Allergies Oxycodone-acetaminophen and Percocet [oxycodone-acetaminophen]    Social History Social History   Tobacco Use  . Smoking status: Former Smoker    Packs/day: 0.25    Years: 10.00    Pack years: 2.50    Types: Cigarettes, Cigars    Last attempt to quit: 10/18/1979  Years since quitting: 37.7  . Smokeless tobacco: Never Used  Substance Use Topics  . Alcohol use: No  . Drug use: No    Review of Systems Patient denies headaches, rhinorrhea, blurry vision, numbness, shortness of breath, chest pain, edema, cough, abdominal pain, nausea, vomiting, diarrhea, dysuria, fevers, rashes or hallucinations unless otherwise stated above in HPI. ____________________________________________   PHYSICAL EXAM:  VITAL SIGNS: Vitals:   07/19/17 0823  BP: 123/62  Pulse: 72  Resp: 18  Temp: 97.6 F (36.4 C)  SpO2: 98%    Constitutional: Alert and oriented.  Eyes: Conjunctivae are normal.  Head: Atraumatic. Nose: No congestion/rhinnorhea. Mouth/Throat: Mucous membranes are moist.   Neck: No stridor. Painless ROM.  Cardiovascular: Normal rate, regular rhythm. Grossly normal heart sounds.  Good peripheral circulation. Respiratory: Normal respiratory effort.  No retractions. Lungs CTAB. Gastrointestinal: Soft and nontender. No distention. No abdominal bruits. No CVA  tenderness. Genitourinary:  Musculoskeletal: Right greater than left lower extremity edema.  Cap refill was 2 seconds bilaterally.  Palpable right PT pulse.  Chronic insufficiency diabetic foot wounds.  No evidence of surrounding cellulitis.  Wound itself actually has well-appearing granulation tissue suggesting some component of re-healing.  Compartments are soft.  No crepitus.  No blistering.  Bilateral groins soft with palpable pulses.  Neurologic:  Normal speech and language. No gross focal neurologic deficits are appreciated. No facial droop Skin:  Skin is warm, dry and intact. No rash noted. Psychiatric: Mood and affect are normal. Speech and behavior are normal.  ____________________________________________   LABS (all labs ordered are listed, but only abnormal results are displayed)  No results found for this or any previous visit (from the past 24 hour(s)). ____________________________________________  EKG ____________________________________________   PROCEDURES  Procedure(s) performed:  Procedures    Critical Care performed: no ____________________________________________   INITIAL IMPRESSION / ASSESSMENT AND PLAN / ED COURSE  Pertinent labs & imaging results that were available during my care of the patient were reviewed by me and considered in my medical decision making (see chart for details).   DDX: cellulitis, chronic wounds, pad  [name] is a 83 y.o. who presents to the ED with chronic PAD and known foot wounds on antibiotics as well as anticoagulation presents for wound check.  Wounds are healing and well-appearing.  No evidence of surrounding cellulitis.  He is afebrile and hemodynamically stable.  Do not feel that further diagnostic testing clinically indicated at this time.  Quite frankly this is the best of seeing Mr. Nuckles in the past several months.  He has adequate and appropriate outpatient follow-up tomorrow.  Remainder of his physical exam is  reassuring.      As part of my medical decision making, I reviewed the following data within the electronic MEDICAL RECORD NUMBER Nursing notes reviewed and incorporated, Labs reviewed, notes from prior ED visits.  ____________________________________________   FINAL CLINICAL IMPRESSION(S) / ED DIAGNOSES  Final diagnoses:  Leg wound, left, sequela  Leg wound, right, sequela      NEW MEDICATIONS STARTED DURING THIS VISIT:  New Prescriptions   No medications on file     Note:  This document was prepared using Dragon voice recognition software and may include unintentional dictation errors.    Willy Eddy, MD 07/19/17 1135

## 2017-07-19 NOTE — ED Notes (Signed)
First Nurse Note:  Patient reviewed with Dr. Roxan Hockeyobinson, no orders at this time.

## 2017-07-19 NOTE — ED Triage Notes (Signed)
After looking at legs and not seeing any wounds, this RN clarified with patient.  Wound is to left hip, but patient's concern today is regarding the swelling and pain he is having bilaterally to his legs and feet.  Right leg appears more swollen than left leg.  Patient has history of heart transplant and congestive heart failure.

## 2017-07-19 NOTE — Telephone Encounter (Signed)
Flagged on EMMI report for not knowing who to call about changes in condition, not having transportation to follow up, and having other questions/problems.  Called and spoke with patient who mentioned he was in the ER earlier today (07/19/17) due to being cold and not warming up.  I asked if he knew about his upcoming appointments and relayed that he does have one tomorrow at [facility], however patient stated he may not be able to go due to not having transportation.  Relies on his grandson who works and takes care of other family members.  I encouraged him to call his doctor's office as early as he could to let them know if he had to cancel/reschedule due to not having transportation.   Asked if the patient was familiar with [facility] and he was, though did not want to use them as he was having issues regarding how far in advance they needed notification. At times it was hard to understand patient over phone and he kept saying things (people/situation) were "trifling".  He expressed frustration over recent hospital visits.  I thanked him for his feedback and informed him he would receive one more automated call in the next few days as a final check-in.

## 2017-07-19 NOTE — ED Notes (Signed)
Cab voucher given, [facility] called. Pt taken to lobby.

## 2017-07-19 NOTE — ED Triage Notes (Signed)
Patient presents to the ED post op left femoral vein surgery.  Patient has both feet wrapped.  Patient presents via EMS from home.  Patient states he believed home health was supposed to come to house to check on wound but no one has come.  EMS states they called the home health agency patient had a number for and they said they did not have patient in their system.  Patient is alert and oriented x 4.

## 2017-07-19 NOTE — Discharge Instructions (Addendum)
Keep legs elevated.  Continue take medications as prescribed.  Follow-up with Dr. Gilda CreaseSchnier as well as Dr. Ether GriffinsFowler in podiatry clinic.

## 2017-07-19 NOTE — ED Notes (Signed)
This RN called pts son at work to have him pick up patient, work states he no longer works there.

## 2017-07-19 NOTE — ED Notes (Signed)
Pt's grandson to pick him up.

## 2017-07-20 ENCOUNTER — Inpatient Hospital Stay: Payer: Self-pay | Admitting: Family Medicine

## 2017-07-28 ENCOUNTER — Telehealth: Payer: Self-pay | Admitting: Family Medicine

## 2017-07-28 NOTE — Telephone Encounter (Signed)
Patient was informed about his medication. He has no question.

## 2017-07-28 NOTE — Telephone Encounter (Signed)
[facility] needs to know med rec from hospital.  He was seen in hospital ED 6/4  He has questions about medications.  I have reviewed his current list in our system and it is up to date. There was no new rx on his discharge from ED and his med rec is correct.  If you could, please contact patient to review medication/questions and confirm correct med rec.  Saralyn PilarAlexander Rilen Shukla, DO [facility] 07/28/2017, 12:38 PM

## 2017-07-29 ENCOUNTER — Ambulatory Visit: Payer: Self-pay | Admitting: Family Medicine

## 2017-07-29 LAB — GLUCOSE, CAPILLARY
Glucose-Capillary: 117 mg/dL — ABNORMAL HIGH (ref 65–99)
Glucose-Capillary: 90 mg/dL (ref 65–99)

## 2017-08-10 ENCOUNTER — Other Ambulatory Visit (INDEPENDENT_AMBULATORY_CARE_PROVIDER_SITE_OTHER): Payer: Self-pay

## 2017-08-10 DIAGNOSIS — I779 Disorder of arteries and arterioles, unspecified: Secondary | ICD-10-CM

## 2017-08-24 ENCOUNTER — Ambulatory Visit: Payer: Medicare HMO

## 2017-08-26 ENCOUNTER — Telehealth: Payer: Self-pay | Admitting: Family Medicine

## 2017-08-26 NOTE — Telephone Encounter (Signed)
[facility]/17 [facility] after patient was previous referred, and they were unable to reach patient to schedule this new patient appointment. Attempted 5/20, 6/4, 6/25, and 7/10.  Concerns about patient's adherence to treatment recommendations and referrals.  Saralyn PilarAlexander Quinlee Sciarra, DO [facility] 08/26/2017, 9:36 AM

## 2017-09-01 ENCOUNTER — Ambulatory Visit (INDEPENDENT_AMBULATORY_CARE_PROVIDER_SITE_OTHER): Payer: Medicare HMO | Admitting: Vascular Surgery

## 2017-09-01 ENCOUNTER — Encounter (INDEPENDENT_AMBULATORY_CARE_PROVIDER_SITE_OTHER): Payer: Medicare HMO

## 2017-09-07 ENCOUNTER — Telehealth: Payer: Self-pay | Admitting: Family Medicine

## 2017-09-07 NOTE — Telephone Encounter (Signed)
There is a telephone note on 08/26/17 about a fax we received from [facility] about the attempted phone calls to patient.  I am not sure how else to help. If you would be able to try to reach patient that would be the next best option, by phone or if needed we can mail a letter to him to provide [facility] information and phone number.  Otherwise, his next apt with me is scheduled for 09/30/17 - I can review this again with him, however this was made abundantly clear to him at his last visit with him, and his AVS had the following information included:  Referral to Rheumatologist - Dr [facility] to follow-up Gout and Joint Pain  [facility] [place] [address] [place], [place] [address] Hours: M-Th 8-5pm / F 8-12noon Phone: [phone]  -----------------------------------  Ultimately if he remains non adherent to medical recommendations treatment plan and referral, I am not sure how else we can help him.  Saralyn PilarAlexander Amijah Timothy, DO [facility] 09/07/2017, 5:31 PM

## 2017-09-07 NOTE — Telephone Encounter (Signed)
[name] with  [facility] said she has tried calling pt since May to schedule appt and has not been able to contact him about referral.  Her call back [phone]

## 2017-09-07 NOTE — Telephone Encounter (Signed)
Incoming call

## 2017-09-08 NOTE — Telephone Encounter (Signed)
Try calling patient no VM set up will advised if he calls back.

## 2017-09-11 ENCOUNTER — Encounter: Payer: Self-pay | Admitting: Emergency Medicine

## 2017-09-11 ENCOUNTER — Other Ambulatory Visit: Payer: Self-pay

## 2017-09-11 ENCOUNTER — Emergency Department
Admission: EM | Admit: 2017-09-11 | Discharge: 2017-09-12 | Disposition: A | Discharge status: Home / Self Care | Payer: Medicare HMO | Attending: Emergency Medicine | Admitting: Emergency Medicine

## 2017-09-11 ENCOUNTER — Emergency Department: Payer: Medicare HMO

## 2017-09-11 DIAGNOSIS — I509 Heart failure, unspecified: Secondary | ICD-10-CM | POA: Insufficient documentation

## 2017-09-11 DIAGNOSIS — Y929 Unspecified place or not applicable: Secondary | ICD-10-CM | POA: Diagnosis not present

## 2017-09-11 DIAGNOSIS — Y998 Other external cause status: Secondary | ICD-10-CM | POA: Insufficient documentation

## 2017-09-11 DIAGNOSIS — R0602 Shortness of breath: Secondary | ICD-10-CM | POA: Insufficient documentation

## 2017-09-11 DIAGNOSIS — I110 Hypertensive heart disease with heart failure: Secondary | ICD-10-CM | POA: Insufficient documentation

## 2017-09-11 DIAGNOSIS — Z7982 Long term (current) use of aspirin: Secondary | ICD-10-CM | POA: Diagnosis not present

## 2017-09-11 DIAGNOSIS — R0600 Dyspnea, unspecified: Secondary | ICD-10-CM | POA: Diagnosis not present

## 2017-09-11 DIAGNOSIS — Z789 Other specified health status: Secondary | ICD-10-CM

## 2017-09-11 DIAGNOSIS — Y33XXXA Other specified events, undetermined intent, initial encounter: Secondary | ICD-10-CM | POA: Insufficient documentation

## 2017-09-11 DIAGNOSIS — E119 Type 2 diabetes mellitus without complications: Secondary | ICD-10-CM | POA: Diagnosis not present

## 2017-09-11 DIAGNOSIS — R600 Localized edema: Secondary | ICD-10-CM | POA: Diagnosis not present

## 2017-09-11 DIAGNOSIS — L97529 Non-pressure chronic ulcer of other part of left foot with unspecified severity: Secondary | ICD-10-CM | POA: Insufficient documentation

## 2017-09-11 DIAGNOSIS — Z87891 Personal history of nicotine dependence: Secondary | ICD-10-CM | POA: Diagnosis not present

## 2017-09-11 DIAGNOSIS — J449 Chronic obstructive pulmonary disease, unspecified: Secondary | ICD-10-CM | POA: Diagnosis not present

## 2017-09-11 DIAGNOSIS — Z7901 Long term (current) use of anticoagulants: Secondary | ICD-10-CM | POA: Diagnosis not present

## 2017-09-11 DIAGNOSIS — Z872 Personal history of diseases of the skin and subcutaneous tissue: Secondary | ICD-10-CM

## 2017-09-11 DIAGNOSIS — Z09 Encounter for follow-up examination after completed treatment for conditions other than malignant neoplasm: Secondary | ICD-10-CM

## 2017-09-11 DIAGNOSIS — I2510 Atherosclerotic heart disease of native coronary artery without angina pectoris: Secondary | ICD-10-CM | POA: Diagnosis not present

## 2017-09-11 DIAGNOSIS — Y939 Activity, unspecified: Secondary | ICD-10-CM | POA: Insufficient documentation

## 2017-09-11 DIAGNOSIS — L97519 Non-pressure chronic ulcer of other part of right foot with unspecified severity: Secondary | ICD-10-CM | POA: Diagnosis not present

## 2017-09-11 DIAGNOSIS — S91302A Unspecified open wound, left foot, initial encounter: Secondary | ICD-10-CM | POA: Diagnosis present

## 2017-09-11 LAB — BASIC METABOLIC PANEL
ANION GAP: 9 (ref 5–15)
BUN: 9 mg/dL (ref 8–23)
CHLORIDE: 108 mmol/L (ref 98–111)
CO2: 24 mmol/L (ref 22–32)
Calcium: 9.6 mg/dL (ref 8.9–10.3)
Creatinine, Ser: 1 mg/dL (ref 0.61–1.24)
GFR calc non Af Amer: >60 mL/min (ref >60)
Glucose, Bld: 92 mg/dL (ref 70–99)
POTASSIUM: 3 mmol/L — AB (ref 3.5–5.1)
SODIUM: 141 mmol/L (ref 135–145)

## 2017-09-11 LAB — CBC
HEMATOCRIT: 40.6 % (ref 40.0–52.0)
HEMOGLOBIN: 14.1 g/dL (ref 13.0–18.0)
MCH: 31.2 pg (ref 26.0–34.0)
MCHC: 34.8 g/dL (ref 32.0–36.0)
MCV: 89.7 fL (ref 80.0–100.0)
Platelets: 175 10*3/uL (ref 150–440)
RBC: 4.52 MIL/uL (ref 4.40–5.90)
RDW: 14.5 % (ref 11.5–14.5)
WBC: 4.8 10*3/uL (ref 3.8–10.6)

## 2017-09-11 LAB — TROPONIN I: Troponin I: <0.03 ng/mL (ref <0.03)

## 2017-09-11 MED ORDER — POTASSIUM CHLORIDE CRYS ER 20 MEQ PO TBCR
40.0000 meq | EXTENDED_RELEASE_TABLET | Freq: Once | ORAL | Status: AC
  Administered 2017-09-11: 40 meq via ORAL
  Filled 2017-09-11: qty 2
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
## 2017-09-11 NOTE — ED Notes (Signed)
Pt has swelling noted to right lower leg as well, which is greater than the left,   Red and Blue top tube sent to lab.

## 2017-09-11 NOTE — Discharge Instructions (Signed)
Follow up with your doctor as recommended.  If you develop any new or worsening symptoms, including but not limited to fever, chest pain, difficulty breathing or wheezing, persistent vomiting, worsening shortness of breath, or other symptoms that concern you, please return to the Emergency Department immediately.

## 2017-09-11 NOTE — ED Notes (Signed)
Left messages on both contact numbers on demo sheet in attempts to arrange transport home. Futile attempts, charge notified.
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
## 2017-09-11 NOTE — ED Triage Notes (Signed)
First Nurse Note:  Called EMS for c/o SOB and sores on the bottom of his feet.  VS wnl.  NAD

## 2017-09-11 NOTE — ED Notes (Signed)
Spoke with [employer] [employer] they will send officer to home address and have family call ER if applicable.
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
## 2017-09-15 ENCOUNTER — Other Ambulatory Visit: Payer: Self-pay | Admitting: *Deleted

## 2017-09-15 ENCOUNTER — Other Ambulatory Visit: Payer: Self-pay | Admitting: Family Medicine

## 2017-09-15 DIAGNOSIS — I5022 Chronic systolic (congestive) heart failure: Secondary | ICD-10-CM

## 2017-09-15 NOTE — Patient Outreach (Signed)
[facility]) Care Management  09/15/2017  [name] 1934-05-02 [id number]   Telephone Screen  Referral Date:  09/15/2017 [facility]  [facility] Reason for Referral:  6 or more ED visits in the past 6 months Insurance:  [employer]   Outreach Attempt:  Outreach attempt #1 to patient for telephone screening. No answer. RN Health Coach left HIPAA compliant voicemail message along with contact information.  Plan:  RN Health Coach will send unsuccessful outreach letter to patient.  RN Health Coach will make another outreach attempt to patient within 3-4 business days if no return call back from patient.   Rhae LernerFarrah Evanne Matsunaga RN [facility] Care [facility]  RN Health Coach [phone] [email or web address]

## 2017-09-16 ENCOUNTER — Other Ambulatory Visit: Payer: Self-pay | Admitting: *Deleted

## 2017-09-16 NOTE — Patient Outreach (Signed)
[facility]) Care Management  09/16/2017  [name] 12/09/1934 [id number]   Telephone Screen  Referral Date:  09/15/2017 Ref[facility] Source[facility]  [facility] Reason for Referral:  6 or more ED visits in the past 6 months Insurance:  [employer]   Outreach Attempt:  Outreach attempt #2 to patient for telephone screening. No answer and unable to leave voicemail message due to voicemail not being set up.  Plan:  RN Health Coach will make another outreach attempt to patient within 3-4 business days if no return call back from patient.  Rhae LernerFarrah Virgene Tirone RN [facility] Care [facility]  RN Health Coach [phone] [email or web address]

## 2017-09-19 ENCOUNTER — Encounter: Payer: Self-pay | Admitting: Family Medicine

## 2017-09-20 ENCOUNTER — Other Ambulatory Visit: Payer: Self-pay | Admitting: *Deleted

## 2017-09-20 NOTE — Patient Outreach (Signed)
[facility]) Care Management  09/20/2017  [name] [birth year] [id number]   Telephone Screen  Referral Date:09/15/2017 Referral Source:[facility] ED Census Reason for Referral:6 or more ED visits in the past 6 months Insurance:[employer] Medicare   Outreach Attempt:  Outreach attempt #3 to patient for telephone screening. No answer. RN Health Coach left HIPAA compliant voicemail message along with contact information.  Plan:  RN Health Coach will close case if no return call from patient within 10 day time period from Unsuccessful Letter being mailed to patient.  Kenneth LernerFarrah Zelta Enfield RN [facility] Care [facility]  RN Health Coach [phone] [name]

## 2017-09-26 ENCOUNTER — Telehealth: Payer: Self-pay | Admitting: Family Medicine

## 2017-09-26 NOTE — Telephone Encounter (Signed)
[name] [facility] said pt is trying to get into assisted living and needs a FL2 form  filled out with recommended level of care for pt.  Her call back number is [phone]

## 2017-09-26 NOTE — Telephone Encounter (Signed)
[facility] patient was requesting assisting and requesting office notes were faxed to her. Dr. Kirtland BouchardK is aware and patient has appointment on Friday.

## 2017-09-27 ENCOUNTER — Emergency Department: Payer: Medicare HMO

## 2017-09-27 ENCOUNTER — Other Ambulatory Visit: Payer: Self-pay

## 2017-09-27 ENCOUNTER — Emergency Department
Admission: EM | Admit: 2017-09-27 | Discharge: 2017-09-27 | Disposition: A | Discharge status: Home / Self Care | Payer: Medicare HMO | Attending: Emergency Medicine | Admitting: Emergency Medicine

## 2017-09-27 DIAGNOSIS — I2510 Atherosclerotic heart disease of native coronary artery without angina pectoris: Secondary | ICD-10-CM | POA: Insufficient documentation

## 2017-09-27 DIAGNOSIS — Z7982 Long term (current) use of aspirin: Secondary | ICD-10-CM | POA: Diagnosis not present

## 2017-09-27 DIAGNOSIS — M7989 Other specified soft tissue disorders: Secondary | ICD-10-CM | POA: Diagnosis not present

## 2017-09-27 DIAGNOSIS — Z95810 Presence of automatic (implantable) cardiac defibrillator: Secondary | ICD-10-CM | POA: Diagnosis not present

## 2017-09-27 DIAGNOSIS — Z7901 Long term (current) use of anticoagulants: Secondary | ICD-10-CM | POA: Diagnosis not present

## 2017-09-27 DIAGNOSIS — G629 Polyneuropathy, unspecified: Secondary | ICD-10-CM

## 2017-09-27 DIAGNOSIS — M79661 Pain in right lower leg: Secondary | ICD-10-CM | POA: Diagnosis not present

## 2017-09-27 DIAGNOSIS — Z87891 Personal history of nicotine dependence: Secondary | ICD-10-CM | POA: Diagnosis not present

## 2017-09-27 DIAGNOSIS — Z951 Presence of aortocoronary bypass graft: Secondary | ICD-10-CM | POA: Diagnosis not present

## 2017-09-27 DIAGNOSIS — R2241 Localized swelling, mass and lump, right lower limb: Secondary | ICD-10-CM | POA: Diagnosis present

## 2017-09-27 DIAGNOSIS — Z79899 Other long term (current) drug therapy: Secondary | ICD-10-CM | POA: Diagnosis not present

## 2017-09-27 DIAGNOSIS — M792 Neuralgia and neuritis, unspecified: Secondary | ICD-10-CM | POA: Diagnosis not present

## 2017-09-27 DIAGNOSIS — F329 Major depressive disorder, single episode, unspecified: Secondary | ICD-10-CM | POA: Diagnosis not present

## 2017-09-27 DIAGNOSIS — I509 Heart failure, unspecified: Secondary | ICD-10-CM | POA: Insufficient documentation

## 2017-09-27 DIAGNOSIS — R0602 Shortness of breath: Secondary | ICD-10-CM | POA: Insufficient documentation

## 2017-09-27 DIAGNOSIS — J449 Chronic obstructive pulmonary disease, unspecified: Secondary | ICD-10-CM | POA: Diagnosis not present

## 2017-09-27 LAB — CBC WITH DIFFERENTIAL/PLATELET
BASOS PCT: 1 %
Basophils Absolute: 0 10*3/uL (ref 0–0.1)
EOS ABS: 0.4 10*3/uL (ref 0–0.7)
EOS PCT: 7 %
HCT: 39.1 % — ABNORMAL LOW (ref 40.0–52.0)
HEMOGLOBIN: 13.5 g/dL (ref 13.0–18.0)
Lymphocytes Relative: 19 %
Lymphs Abs: 1 10*3/uL (ref 1.0–3.6)
MCH: 31.2 pg (ref 26.0–34.0)
MCHC: 34.4 g/dL (ref 32.0–36.0)
MCV: 90.7 fL (ref 80.0–100.0)
Monocytes Absolute: 0.4 10*3/uL (ref 0.2–1.0)
Monocytes Relative: 9 %
NEUTROS PCT: 64 %
Neutro Abs: 3.4 10*3/uL (ref 1.4–6.5)
PLATELETS: 190 10*3/uL (ref 150–440)
RBC: 4.31 MIL/uL — AB (ref 4.40–5.90)
RDW: 15.3 % — ABNORMAL HIGH (ref 11.5–14.5)
WBC: 5.2 10*3/uL (ref 3.8–10.6)

## 2017-09-27 LAB — COMPREHENSIVE METABOLIC PANEL
ALT: 12 U/L (ref 0–44)
ANION GAP: 7 (ref 5–15)
AST: 30 U/L (ref 15–41)
Albumin: 4 g/dL (ref 3.5–5.0)
Alkaline Phosphatase: 85 U/L (ref 38–126)
BILIRUBIN TOTAL: 0.9 mg/dL (ref 0.3–1.2)
BUN: 10 mg/dL (ref 8–23)
CO2: 25 mmol/L (ref 22–32)
Calcium: 9.5 mg/dL (ref 8.9–10.3)
Chloride: 110 mmol/L (ref 98–111)
Creatinine, Ser: 0.99 mg/dL (ref 0.61–1.24)
GFR calc Af Amer: >60 mL/min (ref >60)
Glucose, Bld: 146 mg/dL — ABNORMAL HIGH (ref 70–99)
POTASSIUM: 3.4 mmol/L — AB (ref 3.5–5.1)
Sodium: 142 mmol/L (ref 135–145)
TOTAL PROTEIN: 7.8 g/dL (ref 6.5–8.1)

## 2017-09-27 LAB — BRAIN NATRIURETIC PEPTIDE: B NATRIURETIC PEPTIDE 5: 194 pg/mL — AB (ref 0.0–100.0)

## 2017-09-27 LAB — TROPONIN I: Troponin I: <0.03 ng/mL (ref <0.03)

## 2017-09-27 NOTE — ED Notes (Signed)
Patient transported to X-ray 

## 2017-09-27 NOTE — ED Notes (Signed)
Called [facility] for transport to [address]  [place]

## 2017-09-27 NOTE — ED Triage Notes (Signed)
Pt arrived via ems from home with breathing difficulty and numbness and swelling to right leg. Pt has hx of CHF, pacemaker, MI and DM. CBG 123, 123/73, 97% RA, P-84. Pt NAD at present, respirations even and non labored.

## 2017-09-27 NOTE — Discharge Instructions (Addendum)
Follow up with Dr. Gwen PoundsKowalski in the office at 230.  They can readjust your pacemaker so it is a little more sensitive to when your heart rate drops.  Please talk to your primary care doctor about getting you some more help at home.  There is nothing I can do right here at this point.  Please return for any worse problems.  Ask your primary care for some more help with your diabetic neuropathy pain too.  They may want to start you on gabapentin or something similar.  Please return for any further problems.  Please also call Dr. Graciela HusbandsKlein the podiatrist.  He can assist you with getting your toenails and fingernails cared for.

## 2017-09-27 NOTE — ED Provider Notes (Addendum)
[facility] Emergency Department Provider Note   ____________________________________________   First MD Initiated Contact with Patient 09/27/17 1226     (approximate)  I have reviewed the triage vital signs and the nursing notes.   HISTORY  Chief Complaint Shortness of Breath Chief complaint is chest pain shortness of breath  HPI [name] is a 83 y.o. male patient complaining of increasing swelling in the right leg for a day or 2 and some chest pain shortness of breath worse today.  EMS reports that while he was coming in on the monitor they noticed his heart rate would go down into the 40s and then he would complain of chest pain shortness of breath and then his patient would kick in.  Symptoms would resolve.  This happened several times.  Patient also complains of several years of sharp stabbing pains in both feet and his left arm letter thought possibly to be diabetic neuropathy pain.   Past Medical History:  Diagnosis Date  . Arthritis   . CAD (coronary artery disease)   . Congestive heart failure (CHF) (HCC) 06/14/2016   LVEF 40-45% 2016 echo; Dr. Darrold Junker  . COPD (chronic obstructive pulmonary disease) (HCC)   . Depression   . Family history of adverse reaction to anesthesia    mom seizures   . GERD (gastroesophageal reflux disease)   . Heart disease   . History of blood clots in legs   . 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 Literacy level of illiterate   . Osteomyelitis (HCC)   . PN (peripheral neuropathy)   . Psoriasis     Patient Active Problem List   Diagnosis Date Noted  . Right knee pain 06/02/2017  . Post-traumatic osteoarthritis of multiple joints 04/28/2017  . Abnormality of plasma protein 03/25/2017  . Elevated beta-2 microglobulin 03/25/2017  . Right leg DVT (HCC) 03/15/2017  . GERD (gastroesophageal reflux disease) 03/10/2017  . Hyperlipidemia, unspecified 03/10/2017  . Peripheral arterial occlusive disease (HCC)  11/24/2016  . Bilateral lower extremity edema 10/19/2016  . Congestive heart failure (CHF) (HCC) 06/14/2016  . Hyperthyroidism 12/04/2015  . Unsteady gait 12/04/2015  . 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 Abnormal serum protein electrophoresis 12/05/2014  . Pressure ulcer 11/15/2014  . Hyperproteinemia 11/02/2014  . Hypoalbuminemia 11/02/2014  . Arthritis 10/23/2014  . Arteriosclerosis of coronary artery 10/23/2014  . Type 2 diabetes, controlled, with neuropathy (HCC) 10/23/2014  . Amputation of finger of left hand 10/23/2014  . History of pulmonary embolism 10/23/2014  . Literacy problem 10/23/2014  . Umbilical hernia without obstruction or gangrene 10/23/2014  . Peripheral neuropathy 10/23/2014  . Artificial cardiac pacemaker 08/10/2013  . H/O coronary artery bypass surgery 08/10/2013  . GIB (gastrointestinal bleeding) 07/18/2013  . Benign prostatic hyperplasia with urinary obstruction 09/27/2012  . Cardiomyopathy (HCC) 08/09/2011    Past Surgical History:  Procedure Laterality Date  . amputation 4th finger Left   . APPENDECTOMY    . CARDIAC SURGERY    . CLAVICLE SURGERY  2012   open reduction and internal fixation of left clavicle  . CORONARY ARTERY BYPASS GRAFT  2002  . FOOT SURGERY    . IMPLANTABLE CARDIOVERTER DEFIBRILLATOR (ICD) GENERATOR CHANGE N/A 12/03/2015   Procedure: ICD GENERATOR CHANGE;  Surgeon: Marcina Millard, MD;  Location: [facility] ORS;  Service: Cardiovascular;  Laterality: N/A;  . PACEMAKER INSERTION    . PERIPHERAL VASCULAR BALLOON ANGIOPLASTY Right 07/12/2017  Procedure: PERIPHERAL VASCULAR BALLOON ANGIOPLASTY;  Surgeon: Renford Dills, MD;  Location: [facility] INVASIVE CV LAB;  Service: Cardiovascular;  Laterality: Right;    Prior to Admission medications   Medication Sig Start Date End Date Taking? Authorizing Provider    acetaminophen (TYLENOL) 500 MG tablet Take 500 mg by mouth every 8 (eight) hours as needed for mild pain or fever.     [provider]  allopurinol (ZYLOPRIM) 100 MG tablet Take 1 tablet (100 mg total) by mouth daily. 06/05/17   Ramonita Lab, MD  apixaban (ELIQUIS) 5 MG TABS tablet Take 1 tablet (5 mg total) by mouth 2 (two) times daily. 06/29/17   Karamalegos, Netta Neat, DO  aspirin EC 81 MG tablet Take 1 tablet (81 mg total) by mouth daily. 05/11/16   Kerman Passey, MD  atorvastatin (LIPITOR) 40 MG tablet Take 1 tablet (40 mg total) by mouth at bedtime. 12/17/16   Kerman Passey, MD  bisoprolol (ZEBETA) 5 MG tablet Take 1 tablet (5 mg total) by mouth daily. 06/05/17   Ramonita Lab, MD  Elastic Bandages & Supports (TRUFORM STOCKINGS 20-30MMHG) MISC Wear on both legs; put on first thing in the morning and remove before bed; do NOT sleep in them 03/24/17   [name], Janit Bern, MD  Elastic Bandages & Supports (WRIST BRACE/RIGHT LARGE) MISC Wear on the right wrist, cock-up wrist splint for carpal tunnel 03/24/17   Kerman Passey, MD  furosemide (LASIX) 20 MG tablet Take 1 tablet (20 mg total) by mouth daily. 09/15/17   Karamalegos, Netta Neat, DO  ibuprofen (ADVIL,MOTRIN) 600 MG tablet Take 1 tablet (600 mg total) by mouth every 6 (six) hours as needed for mild pain or moderate pain. Pt takes twice a day, am/pm 07/13/17   Delfino Lovett, MD  mupirocin cream (BACTROBAN) 2 % Apply topically daily. 06/05/17   Gouru, Deanna Artis, MD  ondansetron (ZOFRAN) 4 MG tablet Take 1 tablet (4 mg total) by mouth every 6 (six) hours as needed for nausea. 06/04/17   Gouru, Deanna Artis, MD  pantoprazole (PROTONIX) 40 MG tablet Take 1 tablet (40 mg total) by mouth daily. 07/18/17   Karamalegos, Netta Neat, DO  potassium chloride (K-DUR,KLOR-CON) 10 MEQ tablet Take 1 tablet (10 mEq total) by mouth daily. 09/15/17   Smitty Cords, DO    Allergies Oxycodone-acetaminophen and Percocet [oxycodone-acetaminophen]  Family History   Problem Relation Age of Onset  . Cancer Mother        spine  . Hypertension Mother   . Mental illness Mother   . Cancer Father        lung  . Asthma Father   . Allergic rhinitis Father   . Arthritis Father   . Tuberculosis Father   . Heart disease Father   . Gout Brother   . Allergic rhinitis Brother     Social History Social History   Tobacco Use  . Smoking status: Former Smoker    Packs/day: 0.25    Years: 10.00    Pack years: 2.50    Types: Cigarettes, Cigars    Last attempt to quit: 10/18/1979    Years since quitting: 37.9  . Smokeless tobacco: Never Used  Substance Use Topics  . Alcohol use: No  . Drug use: No    Review of Systems  Constitutional: No fever/chills Eyes: No visual changes. ENT: No sore throat. Cardiovascular: Denies chest pain. Respiratory: Denies shortness of breath. Gastrointestinal: No abdominal pain.  No nausea, no vomiting.  No diarrhea.  No constipation. Genitourinary: Negative for dysuria. Musculoskeletal: Negative for back pain. Skin: Negative for rash. Neurological: Negative for headaches, focal weakness   ____________________________________________   PHYSICAL EXAM:  VITAL SIGNS: ED Triage Vitals  Enc Vitals Group     BP      Pulse      Resp      Temp      Temp src      SpO2      Weight      Height      Head Circumference      Peak Flow      Pain Score      Pain Loc      Pain Edu?      Excl. in GC?     Constitutional: Alert and oriented. Well appearing and in no acute distress. Eyes: Conjunctivae are normal. PER. EOMI. Head: Atraumatic. Nose: No congestion/rhinnorhea. Mouth/Throat: Mucous membranes are moist.  Oropharynx non-erythematous. Neck: No stridor.  Cardiovascular: Normal rate, regular rhythm. Grossly normal heart sounds.  Good peripheral circulation. Respiratory: Normal respiratory effort.  No retractions. Lungs CTAB. Gastrointestinal: Soft and nontender. No distention. No abdominal bruits. No CVA  tenderness. Musculoskeletal: Bilateral edema right much greater than left Neurologic:  Normal speech and language. No gross focal neurologic deficits are appreciated.  Patient does complain of the pain which is neuropathic in nature Skin:  Skin is warm, dry and intact. No rash noted. Psychiatric: Mood and affect are normal. Speech and behavior are normal.  ____________________________________________   LABS (all labs ordered are listed, but only abnormal results are displayed)  Labs Reviewed  COMPREHENSIVE METABOLIC PANEL - Abnormal; Notable for the following components:      Result Value   Potassium 3.4 (*)    Glucose, Bld 146 (*)    All other components within normal limits  BRAIN NATRIURETIC PEPTIDE - Abnormal; Notable for the following components:   B Natriuretic Peptide 194.0 (*)    All other components within normal limits  CBC WITH DIFFERENTIAL/PLATELET - Abnormal; Notable for the following components:   RBC 4.31 (*)    HCT 39.1 (*)    RDW 15.3 (*)    All other components within normal limits  TROPONIN I   ____________________________________________  EKG  EKG read interpreted by me shows a fully paced rhythm rate of 61 atrioventricular sequential pacer is what it appears to be ____________________________________________  RADIOLOGY  ED MD interpretation:   Official radiology report(s): Dg Chest 2 View  Result Date: 09/27/2017 CLINICAL DATA:  Shortness of breath EXAM: CHEST - 2 VIEW COMPARISON:  09/11/2017 FINDINGS: The lungs are hyperinflated likely secondary to COPD. There is no focal consolidation. There is no pleural effusion or pneumothorax. The heart and mediastinal contours are unremarkable. There is a 3 lead cardiac pacemaker. There is evidence of prior CABG. There is prior ORIF of the left clavicle. IMPRESSION: No active cardiopulmonary disease. Electronically Signed   By: Elige KoHetal  Patel   On: 09/27/2017 13:15   [place] Venous Img Lower Unilateral Right  Result  Date: 09/27/2017 CLINICAL DATA:  Swelling, pain EXAM: RIGHT LOWER EXTREMITY VENOUS DOPPLER ULTRASOUND TECHNIQUE: Gray-scale sonography with compression, as well as color and duplex ultrasound, were performed to evaluate the deep venous system from the level of the common femoral vein through the popliteal and proximal calf veins. COMPARISON:  05/24/2017 FINDINGS: The distal femoral vein is incompletely compressible. There is partially occlusive non compressible thrombus in the popliteal vein. Flow signal is evident on color Doppler.  Visualized posterior tibial veins are unremarkable. More centrally the common femoral vein, saphenofemoral junction, and deep femoral vein remain patent. IMPRESSION: 1. Chronic partially occlusive DVT in the right popliteal and distal femoral veins, similar extent of involvement compared to prior study of 05/24/2017. Electronically Signed   By: Corlis Leak  Hassell M.D.   On: 09/27/2017 14:15    ____________________________________________   PROCEDURES  Procedure(s) performed:   Procedures  Critical Care performed:   ____________________________________________   INITIAL IMPRESSION / ASSESSMENT AND PLAN / ED COURSE Discussed in detail with Dr. Rhae LernerKolosky.  They will see him in the office on Thursday and try to adjust the pacemaker so it is a little more sensitive and picks up pacing at a higher heart rate.  I will have patient follow-up with his primary care doctor to see if perhaps gabapentin or something similar could help with the diabetic neuropathy and also his primary care doctor can work on trying to get him more care at home.          ____________________________________________   FINAL CLINICAL IMPRESSION(S) / ED DIAGNOSES  Final diagnoses:  SOB (shortness of breath)  Neuropathy     ED Discharge Orders    None    Patient says he has not had anything to eat since breakfast.  I did give him a [place] sandwich and something to drink and some crackers but  he does not want that he wants something hot I do not have any hot food to give him unfortunately he is going to call his social worker but we will plan on discharging him I have asked him to follow-up with Dr. Rhae LernerKolosky he also needs to follow-up with podiatry and his primary care doctor.  He seems to be more than stable medically at this point.   Note:  This document was prepared using Dragon voice recognition software and may include unintentional dictation errors.    Arnaldo NatalMalinda, Paul F, MD 09/27/17 1548    Arnaldo NatalMalinda, Paul F, MD 09/27/17 [phone]

## 2017-09-28 ENCOUNTER — Other Ambulatory Visit: Payer: Self-pay | Admitting: *Deleted

## 2017-09-28 NOTE — Patient Outreach (Signed)
[facility] Care Management  09/28/2017  [name] 1935/01/26 [id number]   Telephone Screen/Case Closure  Referral Date:09/15/2017 Referral Source:[facility] ED Census Reason for Referral:6 or more ED visits in the past 6 months Insurance:[employer]   Outreach Attempt:  Multiple attempts to establish contact with patient without success. No response from letter mailed to patient. Case is being closed at this time.   Plan:  RN Health Coach will close case based on inability to establish contact with patient.   Rhae LernerFarrah Loranzo Desha RN [facility] Care Management  RN Health Coach [phone] [name]

## 2017-09-29 DIAGNOSIS — Z950 Presence of cardiac pacemaker: Secondary | ICD-10-CM | POA: Diagnosis not present

## 2017-09-29 DIAGNOSIS — E785 Hyperlipidemia, unspecified: Secondary | ICD-10-CM | POA: Diagnosis not present

## 2017-09-29 DIAGNOSIS — Z951 Presence of aortocoronary bypass graft: Secondary | ICD-10-CM | POA: Diagnosis not present

## 2017-09-30 ENCOUNTER — Ambulatory Visit: Payer: Self-pay | Admitting: Family Medicine

## 2017-10-04 ENCOUNTER — Emergency Department: Payer: Medicare HMO

## 2017-10-04 ENCOUNTER — Emergency Department
Admission: EM | Admit: 2017-10-04 | Discharge: 2017-10-04 | Disposition: A | Discharge status: Home / Self Care | Payer: Medicare HMO | Attending: Emergency Medicine | Admitting: Emergency Medicine

## 2017-10-04 DIAGNOSIS — R208 Other disturbances of skin sensation: Secondary | ICD-10-CM | POA: Diagnosis not present

## 2017-10-04 DIAGNOSIS — I2510 Atherosclerotic heart disease of native coronary artery without angina pectoris: Secondary | ICD-10-CM | POA: Insufficient documentation

## 2017-10-04 DIAGNOSIS — E1140 Type 2 diabetes mellitus with diabetic neuropathy, unspecified: Secondary | ICD-10-CM | POA: Insufficient documentation

## 2017-10-04 DIAGNOSIS — R209 Unspecified disturbances of skin sensation: Secondary | ICD-10-CM | POA: Diagnosis not present

## 2017-10-04 DIAGNOSIS — R0989 Other specified symptoms and signs involving the circulatory and respiratory systems: Secondary | ICD-10-CM | POA: Diagnosis not present

## 2017-10-04 DIAGNOSIS — J449 Chronic obstructive pulmonary disease, unspecified: Secondary | ICD-10-CM | POA: Insufficient documentation

## 2017-10-04 DIAGNOSIS — Z951 Presence of aortocoronary bypass graft: Secondary | ICD-10-CM | POA: Insufficient documentation

## 2017-10-04 DIAGNOSIS — Z79899 Other long term (current) drug therapy: Secondary | ICD-10-CM | POA: Diagnosis not present

## 2017-10-04 DIAGNOSIS — Z7901 Long term (current) use of anticoagulants: Secondary | ICD-10-CM | POA: Diagnosis not present

## 2017-10-04 DIAGNOSIS — Z7982 Long term (current) use of aspirin: Secondary | ICD-10-CM | POA: Diagnosis not present

## 2017-10-04 DIAGNOSIS — I509 Heart failure, unspecified: Secondary | ICD-10-CM | POA: Diagnosis not present

## 2017-10-04 DIAGNOSIS — Z87891 Personal history of nicotine dependence: Secondary | ICD-10-CM | POA: Diagnosis not present

## 2017-10-04 DIAGNOSIS — Z950 Presence of cardiac pacemaker: Secondary | ICD-10-CM | POA: Insufficient documentation

## 2017-10-04 LAB — BASIC METABOLIC PANEL
Anion gap: 8 (ref 5–15)
BUN: 9 mg/dL (ref 8–23)
CO2: 23 mmol/L (ref 22–32)
Calcium: 9.9 mg/dL (ref 8.9–10.3)
Chloride: 108 mmol/L (ref 98–111)
Creatinine, Ser: 1.13 mg/dL (ref 0.61–1.24)
GFR calc Af Amer: >60 mL/min (ref >60)
GFR calc non Af Amer: 59 mL/min — ABNORMAL LOW (ref >60)
Glucose, Bld: 121 mg/dL — ABNORMAL HIGH (ref 70–99)
Potassium: 3.4 mmol/L — ABNORMAL LOW (ref 3.5–5.1)
Sodium: 139 mmol/L (ref 135–145)

## 2017-10-04 LAB — CBC
HCT: 45 % (ref 40.0–52.0)
Hemoglobin: 15.3 g/dL (ref 13.0–18.0)
MCH: 31.8 pg (ref 26.0–34.0)
MCHC: 34.1 g/dL (ref 32.0–36.0)
MCV: 93.4 fL (ref 80.0–100.0)
Platelets: 188 10*3/uL (ref 150–440)
RBC: 4.82 MIL/uL (ref 4.40–5.90)
RDW: 15.5 % — ABNORMAL HIGH (ref 11.5–14.5)
WBC: 5.8 10*3/uL (ref 3.8–10.6)

## 2017-10-04 LAB — TROPONIN I: Troponin I: <0.03 ng/mL (ref <0.03)

## 2017-10-04 NOTE — ED Triage Notes (Addendum)
Blood drawn at this time, pt still stating the need for his appt at 3 pm. [place] Rn and this RN explained we aren't that MD office.

## 2017-10-04 NOTE — ED Triage Notes (Signed)
Pt in via EMS with c/o hand coldness and tingling. EMS reports pt has been seen here for the same 4 times recently.   FSBS 218

## 2017-10-04 NOTE — Discharge Instructions (Addendum)
Please seek medical attention for any high fevers, chest pain, shortness of breath, change in behavior, persistent vomiting, bloody stool or any other new or concerning symptoms.  

## 2017-10-04 NOTE — ED Notes (Signed)
Pt states that his hands are "freezing cold and none of the doctors can tell me why." Pt states that the coldness and numbness are getting worse.

## 2017-10-04 NOTE — ED Provider Notes (Signed)
[facility] Emergency Department Provider Note   ____________________________________________   I have reviewed the triage vital signs and the nursing notes.   HISTORY  Chief Complaint Cold extremities  History limited by: Not Limited   HPI [name] is a 83 y.o. male who presents to the emergency department today because of concern for cold hands and legs. States symptoms have been going on for the past 6-8 months. The patient states that he will be at church at times and people will shake his hand and be shocked by how cold they are. The patient states it is in both upper and lower extremities bilaterally. The patient is somewhat upset because he does not know why his cardiologist will not see him.    Per medical record review patient has a history of multiple emergency department visits in the past 6 months.  Past Medical History:  Diagnosis Date  . Arthritis   . CAD (coronary artery disease)   . Congestive heart failure (CHF) (HCC) 06/14/2016   LVEF 40-45% 2016 echo; Dr. Darrold JunkerParaschos  . COPD (chronic obstructive pulmonary disease) (HCC)   . Depression   . Family history of adverse reaction to anesthesia    mom seizures   . GERD (gastroesophageal reflux disease)   . Heart disease   . History of blood clots in legs   . 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 Literacy level of illiterate   . Osteomyelitis (HCC)   . PN (peripheral neuropathy)   . Psoriasis     Patient Active Problem List   Diagnosis Date Noted  . Right knee pain 06/02/2017  . Post-traumatic osteoarthritis of multiple joints 04/28/2017  . Abnormality of plasma protein 03/25/2017  . Elevated beta-2 microglobulin 03/25/2017  . Right leg DVT (HCC) 03/15/2017  . GERD (gastroesophageal reflux disease) 03/10/2017  . Hyperlipidemia, unspecified 03/10/2017  . Peripheral arterial occlusive disease (HCC) 11/24/2016  . Bilateral lower extremity edema 10/19/2016  . Congestive heart failure  (CHF) (HCC) 06/14/2016  . Hyperthyroidism 12/04/2015  . Unsteady gait 12/04/2015  . 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 Abnormal serum protein electrophoresis 12/05/2014  . Pressure ulcer 11/15/2014  . Hyperproteinemia 11/02/2014  . Hypoalbuminemia 11/02/2014  . Arthritis 10/23/2014  . Arteriosclerosis of coronary artery 10/23/2014  . Type 2 diabetes, controlled, with neuropathy (HCC) 10/23/2014  . Amputation of finger of left hand 10/23/2014  . History of pulmonary embolism 10/23/2014  . Literacy problem 10/23/2014  . Umbilical hernia without obstruction or gangrene 10/23/2014  . Peripheral neuropathy 10/23/2014  . Artificial cardiac pacemaker 08/10/2013  . H/O coronary artery bypass surgery 08/10/2013  . GIB (gastrointestinal bleeding) 07/18/2013  . Benign prostatic hyperplasia with urinary obstruction 09/27/2012  . Cardiomyopathy (HCC) 08/09/2011    Past Surgical History:  Procedure Laterality Date  . amputation 4th finger Left   . APPENDECTOMY    . CARDIAC SURGERY    . CLAVICLE SURGERY  2012   open reduction and internal fixation of left clavicle  . CORONARY ARTERY BYPASS GRAFT  2002  . FOOT SURGERY    . IMPLANTABLE CARDIOVERTER DEFIBRILLATOR (ICD) GENERATOR CHANGE N/A 12/03/2015   Procedure: ICD GENERATOR CHANGE;  Surgeon: Marcina MillardAlexander Paraschos, MD;  Location: [facility] ORS;  Service: Cardiovascular;  Laterality: N/A;  . PACEMAKER INSERTION    . PERIPHERAL VASCULAR BALLOON ANGIOPLASTY Right 07/12/2017   Procedure: PERIPHERAL VASCULAR BALLOON ANGIOPLASTY;  Surgeon: Renford DillsSchnier, Gregory G, MD;  [facility]  INVASIVE CV LAB;  Service: Cardiovascular;  Laterality: Right;    Prior to Admission medications   Medication Sig Start Date End Date Taking? Authorizing Provider  acetaminophen (TYLENOL) 500 MG tablet Take 500 mg by mouth every 8 (eight) hours as needed for  mild pain or fever.     [provider]  allopurinol (ZYLOPRIM) 100 MG tablet Take 1 tablet (100 mg total) by mouth daily. 06/05/17   Ramonita LabGouru, Aruna, MD  apixaban (ELIQUIS) 5 MG TABS tablet Take 1 tablet (5 mg total) by mouth 2 (two) times daily. 06/29/17   Karamalegos, Netta NeatAlexander J, DO  aspirin EC 81 MG tablet Take 1 tablet (81 mg total) by mouth daily. 05/11/16   Kerman PasseyLada, Melinda P, MD  atorvastatin (LIPITOR) 40 MG tablet Take 1 tablet (40 mg total) by mouth at bedtime. 12/17/16   Kerman PasseyLada, Melinda P, MD  bisoprolol (ZEBETA) 5 MG tablet Take 1 tablet (5 mg total) by mouth daily. 06/05/17   Ramonita LabGouru, Aruna, MD  Elastic Bandages & Supports (TRUFORM STOCKINGS 20-30MMHG) MISC Wear on both legs; put on first thing in the morning and remove before bed; do NOT sleep in them 03/24/17   [name], [name], MD  Elastic Bandages & Supports (WRIST BRACE/RIGHT LARGE) MISC Wear on the right wrist, cock-up wrist splint for carpal tunnel 03/24/17   Kerman PasseyLada, Melinda P, MD  furosemide (LASIX) 20 MG tablet Take 1 tablet (20 mg total) by mouth daily. 09/15/17   Karamalegos, Netta NeatAlexander J, DO  ibuprofen (ADVIL,MOTRIN) 600 MG tablet Take 1 tablet (600 mg total) by mouth every 6 (six) hours as needed for mild pain or moderate pain. Pt takes twice a day, am/pm 07/13/17   Delfino LovettShah, Vipul, MD  mupirocin cream (BACTROBAN) 2 % Apply topically daily. 06/05/17   Gouru, Deanna ArtisAruna, MD  ondansetron (ZOFRAN) 4 MG tablet Take 1 tablet (4 mg total) by mouth every 6 (six) hours as needed for nausea. 06/04/17   Gouru, Deanna ArtisAruna, MD  pantoprazole (PROTONIX) 40 MG tablet Take 1 tablet (40 mg total) by mouth daily. 07/18/17   Karamalegos, Netta NeatAlexander J, DO  potassium chloride (K-DUR,KLOR-CON) 10 MEQ tablet Take 1 tablet (10 mEq total) by mouth daily. 09/15/17   Smitty CordsKaramalegos, Alexander J, DO    Allergies Oxycodone-acetaminophen and Percocet [oxycodone-acetaminophen]  Family History  Problem Relation Age of Onset  . Cancer Mother        spine  . Hypertension Mother   . Mental  illness Mother   . Cancer Father        lung  . Asthma Father   . Allergic rhinitis Father   . Arthritis Father   . Tuberculosis Father   . Heart disease Father   . Gout Brother   . Allergic rhinitis Brother     Social History Social History   Tobacco Use  . Smoking status: Former Smoker    Packs/day: 0.25    Years: 10.00    Pack years: 2.50    Types: Cigarettes, Cigars    Last attempt to quit: 10/18/1979    Years since quitting: 37.9  . Smokeless tobacco: Never Used  Substance Use Topics  . Alcohol use: No  . Drug use: No    Review of Systems Constitutional: No fever/chills Eyes: No visual changes. ENT: No sore throat. Cardiovascular: Positive for occasional chest pain, none currently Respiratory: Denies shortness of breath. Gastrointestinal: No abdominal pain.  No nausea, no vomiting.  No diarrhea.   Genitourinary: Negative for dysuria. Musculoskeletal: Positive for cold hands and  feet. Skin: Negative for rash. Neurological: Negative for headaches, focal weakness or numbness.  ____________________________________________   PHYSICAL EXAM:  VITAL SIGNS: ED Triage Vitals  Enc Vitals Group     BP 10/04/17 1252 (!) 146/70     Pulse Rate 10/04/17 1252 60     Resp --      Temp 10/04/17 1252 97.8 F (36.6 C)     Temp Source 10/04/17 1252 Oral     SpO2 10/04/17 1250 100 %     Weight 10/04/17 1252 189 lb 14.1 oz (86.1 kg)     Height 10/04/17 1252 6' (1.829 m)     Head Circumference --      Peak Flow --      Pain Score 10/04/17 1252 0   Constitutional: Alert and oriented.  Eyes: Conjunctivae are normal.  ENT      Head: Normocephalic and atraumatic.      Nose: No congestion/rhinnorhea.      Mouth/Throat: Mucous membranes are moist.      Neck: No stridor. Hematological/Lymphatic/Immunilogical: No cervical lymphadenopathy. Cardiovascular: Normal rate, regular rhythm.  No murmurs, rubs, or gallops.  Respiratory: Normal respiratory effort without tachypnea nor  retractions. Breath sounds are clear and equal bilaterally. No wheezes/rales/rhonchi. Gastrointestinal: Soft and non tender. No rebound. No guarding.  Genitourinary: Deferred Musculoskeletal: Normal range of motion in all extremities. No lower extremity edema. Neurologic:  Normal speech and language. No gross focal neurologic deficits are appreciated.  Skin:  Skin is warm, dry and intact. No rash noted. Psychiatric: Mood and affect are normal. Speech and behavior are normal. Patient exhibits appropriate insight and judgment.  ____________________________________________    LABS (pertinent positives/negatives)  CBC wbc 5.8, hgb 15.3, plt 188 BMP na 139, k 3.4, glu 121, cr 1.13 Trop <0.03 ____________________________________________   EKG  None  ____________________________________________    RADIOLOGY  CXR No acute disease  ____________________________________________   PROCEDURES  Procedures  ____________________________________________   INITIAL IMPRESSION / ASSESSMENT AND PLAN / ED COURSE  Pertinent labs & imaging results that were available during my care of the patient were reviewed by me and considered in my medical decision making (see chart for details).   Patient presented because of concern for cold extremities. Also has been having occasional chest pain, none currently. Work up without any concerning findings. Discussed with the patient that he will have to follow up with his primary care and cardiologist.    ____________________________________________   FINAL CLINICAL IMPRESSION(S) / ED DIAGNOSES  Final diagnoses:  Cold hands and feet     Note: This dictation was prepared with Dragon dictation. Any transcriptional errors that result from this process are unintentional     Phineas Semen, MD 10/04/17 1651

## 2017-10-04 NOTE — ED Triage Notes (Addendum)
Pt presents via [facility] for numbness and coldness of the left hand. Pt has been seen here 4 times recently. Pt states he has an appt this afternoon at 3pm with   . Pt is able to use hand w/o difficulties. Pt is NAD and calling Dr. Darrold JunkerParaschos now to inform of ED visit. Pt is a diabetic and denies N/V/D as well as  Denies chest pain a this time.

## 2017-10-04 NOTE — ED Notes (Signed)
Pt is aware that he was a difficult stick and that he may need to be stuck again if lab refuses. [place] RN educated.

## 2017-10-05 ENCOUNTER — Telehealth: Payer: Self-pay | Admitting: Family Medicine

## 2017-10-05 NOTE — Telephone Encounter (Signed)
Pt was in ER yesterday and [facility]  His call back number is [phone]

## 2017-10-06 NOTE — Telephone Encounter (Signed)
Patient was advised to schedule appointment for hospital follow up and he also need appt  For FL2 forms he was repeating same complains over and over and didn't want to schedule appointment since he can't get any ride [facility]

## 2017-10-06 NOTE — Telephone Encounter (Signed)
Unable to reach patient he needs appointment for hospital follow up and he needs to contact [facility] but they will reach out to him as well.

## 2017-10-07 ENCOUNTER — Ambulatory Visit: Payer: Self-pay | Admitting: Family Medicine

## 2017-10-07 ENCOUNTER — Emergency Department
Admission: EM | Admit: 2017-10-07 | Discharge: 2017-10-07 | Disposition: A | Discharge status: Home / Self Care | Payer: Medicare HMO | Attending: Emergency Medicine | Admitting: Emergency Medicine

## 2017-10-07 ENCOUNTER — Other Ambulatory Visit: Payer: Self-pay

## 2017-10-07 DIAGNOSIS — T7431XA Adult psychological abuse, confirmed, initial encounter: Secondary | ICD-10-CM

## 2017-10-07 DIAGNOSIS — F0151 Vascular dementia with behavioral disturbance: Secondary | ICD-10-CM | POA: Diagnosis not present

## 2017-10-07 DIAGNOSIS — F0281 Dementia in other diseases classified elsewhere with behavioral disturbance: Secondary | ICD-10-CM

## 2017-10-07 DIAGNOSIS — I2510 Atherosclerotic heart disease of native coronary artery without angina pectoris: Secondary | ICD-10-CM | POA: Diagnosis not present

## 2017-10-07 DIAGNOSIS — E1140 Type 2 diabetes mellitus with diabetic neuropathy, unspecified: Secondary | ICD-10-CM | POA: Insufficient documentation

## 2017-10-07 DIAGNOSIS — Z7982 Long term (current) use of aspirin: Secondary | ICD-10-CM | POA: Insufficient documentation

## 2017-10-07 DIAGNOSIS — Z951 Presence of aortocoronary bypass graft: Secondary | ICD-10-CM | POA: Diagnosis not present

## 2017-10-07 DIAGNOSIS — Z87891 Personal history of nicotine dependence: Secondary | ICD-10-CM | POA: Diagnosis not present

## 2017-10-07 DIAGNOSIS — J449 Chronic obstructive pulmonary disease, unspecified: Secondary | ICD-10-CM | POA: Diagnosis not present

## 2017-10-07 DIAGNOSIS — T7631XA Adult psychological abuse, suspected, initial encounter: Secondary | ICD-10-CM | POA: Diagnosis present

## 2017-10-07 DIAGNOSIS — Z955 Presence of coronary angioplasty implant and graft: Secondary | ICD-10-CM | POA: Diagnosis not present

## 2017-10-07 DIAGNOSIS — S069X9S Unspecified intracranial injury with loss of consciousness of unspecified duration, sequela: Secondary | ICD-10-CM | POA: Diagnosis not present

## 2017-10-07 DIAGNOSIS — Z950 Presence of cardiac pacemaker: Secondary | ICD-10-CM | POA: Insufficient documentation

## 2017-10-07 DIAGNOSIS — F02818 Dementia in other diseases classified elsewhere, unspecified severity, with other behavioral disturbance: Secondary | ICD-10-CM

## 2017-10-07 DIAGNOSIS — Z79899 Other long term (current) drug therapy: Secondary | ICD-10-CM | POA: Diagnosis not present

## 2017-10-07 MED ORDER — CALCIUM CARBONATE ANTACID 500 MG PO CHEW
1.0000 | CHEWABLE_TABLET | Freq: Once | ORAL | Status: AC
  Administered 2017-10-07: 200 mg via ORAL
  Filled 2017-10-07: qty 1

## 2017-10-07 MED ORDER — ACETAMINOPHEN 325 MG PO TABS
650.0000 mg | ORAL_TABLET | Freq: Once | ORAL | Status: AC
  Administered 2017-10-07: 650 mg via ORAL

## 2017-10-07 MED ORDER — ACETAMINOPHEN 325 MG PO TABS
ORAL_TABLET | ORAL | Status: AC
  Filled 2017-10-07: qty 2

## 2017-10-07 NOTE — ED Notes (Signed)
Given drink.  

## 2017-10-07 NOTE — ED Notes (Signed)
C/o right foot 3rd digit pain. 10/10

## 2017-10-07 NOTE — ED Notes (Signed)
Called [facility] to report, sent to voicemail. Voicemail left to return phone call.
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
## 2017-10-07 NOTE — ED Notes (Signed)
No peripheral IV placed this visit.   Discharge instructions reviewed with patient. Questions fielded by this RN. Patient verbalizes understanding of instructions. Patient discharged home in stable condition per [place]. No acute distress noted at time of discharge.    

## 2017-10-07 NOTE — ED Notes (Signed)
Placed APS call to [facility].

## 2017-10-12 ENCOUNTER — Encounter: Payer: Self-pay | Admitting: Family Medicine

## 2017-10-12 ENCOUNTER — Telehealth: Payer: Self-pay | Admitting: Family Medicine

## 2017-10-12 NOTE — Progress Notes (Signed)
FORM COMPLETION - FL2 (Adult Care Home)  Indication: New FL2 Initial start date of services / admission: TBD Date of completed form: 10/12/17 Level of Care: ALF Contact: [name] [facility]  Date of last office visits: 06/29/17, multiple recent ED visits  Admitting Diagnosis / Medical Diagnosis List - ICD10 1. Neurocognitive disorder with TBI behavioral disturbances  - onset 2017 (S06.9X9S, F01.51) 2. Peripheral Arterial Occlusive Disease - onset 2018 (I77.9) 3. Peripheral Neuropathy - G62.9 4. Post Traumatic Osteoarthritis multiple joints (M15.3) 5. Type 2 Diabetes with neuropathy - (E11.40)  Patient Information: - Disoriented (Intermittently) - Ambulatory - Continent (Bladder, Bowel) - Functional Limitations (None) - Communication of needs: Verbal  - Respiration: Normal - Personal Care Assistance -  - Active - Skin Normal (venous stasis edema of legs) - Nutrition: Regular diet  Medication List: - Allopurinol 100mg  PO daily - Eliquis 5mg  PO BID - Aspirin 81mg  PO daily - Atorvastatin 40mg  PO nightly - Bisoprolol 5mg  PO daily - Furosemide 20mg  daily PO  - Pantoprazole 40mg  daily PO - Potassium KCl 10mEq PO daily  Forms completed, signed, dated and along with discharge letter from our practice, [place]ed to [place] [facility] at fax# [phone]  Kenneth PilarAlexander Kaitlyn Skowron, DO [facility] 10/12/2017, 12:24 PM

## 2017-10-12 NOTE — Telephone Encounter (Signed)
Our office has been in communication with patient's case worker from [place] [facility] - [name], who has discussed with our office manager Oneal GroutAshley Mann, and ultimately we have agreed to complete his FL2 form required prior to admission to Assisted Living Facility (ALF), however given repeated problems with communication and follow-up with our office, non adherence to treatment and referrals to specialist, repeated ED visits without contact our office, and several instances of verbally inappropriate behavior with staff, we have decided to discharge him from our practice and we will no longer be his PCP. He will need to relocate and perhaps use medical staff on site of potential new living arrangement if available.  Completed FL2 will be signed and [facility] at fax# [phone]. Additionally we will attach formal discharge letter as patient is no longer at current address.  Saralyn PilarAlexander Karamalegos, DO [facility] 10/12/2017, 12:07 PM

## 2017-11-01 ENCOUNTER — Other Ambulatory Visit: Payer: Self-pay

## 2017-11-01 ENCOUNTER — Emergency Department
Admission: EM | Admit: 2017-11-01 | Discharge: 2017-11-02 | Disposition: A | Discharge status: Home / Self Care | Payer: Medicare HMO | Attending: Emergency Medicine | Admitting: Emergency Medicine

## 2017-11-01 DIAGNOSIS — M25562 Pain in left knee: Secondary | ICD-10-CM | POA: Diagnosis present

## 2017-11-01 DIAGNOSIS — M109 Gout, unspecified: Secondary | ICD-10-CM | POA: Diagnosis not present

## 2017-11-01 DIAGNOSIS — Z87891 Personal history of nicotine dependence: Secondary | ICD-10-CM | POA: Diagnosis not present

## 2017-11-01 DIAGNOSIS — E1140 Type 2 diabetes mellitus with diabetic neuropathy, unspecified: Secondary | ICD-10-CM | POA: Diagnosis not present

## 2017-11-01 DIAGNOSIS — Z79899 Other long term (current) drug therapy: Secondary | ICD-10-CM | POA: Diagnosis not present

## 2017-11-01 DIAGNOSIS — Z86718 Personal history of other venous thrombosis and embolism: Secondary | ICD-10-CM | POA: Diagnosis not present

## 2017-11-01 DIAGNOSIS — Z7982 Long term (current) use of aspirin: Secondary | ICD-10-CM | POA: Diagnosis not present

## 2017-11-01 DIAGNOSIS — Z7901 Long term (current) use of anticoagulants: Secondary | ICD-10-CM | POA: Insufficient documentation

## 2017-11-01 DIAGNOSIS — R531 Weakness: Secondary | ICD-10-CM | POA: Diagnosis not present

## 2017-11-01 DIAGNOSIS — I2510 Atherosclerotic heart disease of native coronary artery without angina pectoris: Secondary | ICD-10-CM | POA: Diagnosis not present

## 2017-11-01 DIAGNOSIS — M1000 Idiopathic gout, unspecified site: Secondary | ICD-10-CM | POA: Diagnosis not present

## 2017-11-01 DIAGNOSIS — J449 Chronic obstructive pulmonary disease, unspecified: Secondary | ICD-10-CM | POA: Diagnosis not present

## 2017-11-01 DIAGNOSIS — I509 Heart failure, unspecified: Secondary | ICD-10-CM | POA: Insufficient documentation

## 2017-11-01 DIAGNOSIS — E785 Hyperlipidemia, unspecified: Secondary | ICD-10-CM | POA: Insufficient documentation

## 2017-11-01 DIAGNOSIS — B029 Zoster without complications: Secondary | ICD-10-CM | POA: Diagnosis not present

## 2017-11-01 DIAGNOSIS — Z951 Presence of aortocoronary bypass graft: Secondary | ICD-10-CM | POA: Diagnosis not present

## 2017-11-01 DIAGNOSIS — M79604 Pain in right leg: Secondary | ICD-10-CM | POA: Diagnosis not present

## 2017-11-01 LAB — COMPREHENSIVE METABOLIC PANEL
ALK PHOS: 106 U/L (ref 38–126)
ALT: 11 U/L (ref 0–44)
AST: 22 U/L (ref 15–41)
Albumin: 3.9 g/dL (ref 3.5–5.0)
Anion gap: 4 — ABNORMAL LOW (ref 5–15)
BILIRUBIN TOTAL: 0.9 mg/dL (ref 0.3–1.2)
BUN: 10 mg/dL (ref 8–23)
CALCIUM: 9.6 mg/dL (ref 8.9–10.3)
CO2: 31 mmol/L (ref 22–32)
CREATININE: 1.16 mg/dL (ref 0.61–1.24)
Chloride: 107 mmol/L (ref 98–111)
GFR calc non Af Amer: 57 mL/min — ABNORMAL LOW (ref >60)
Glucose, Bld: 111 mg/dL — ABNORMAL HIGH (ref 70–99)
Potassium: 3.5 mmol/L (ref 3.5–5.1)
Sodium: 142 mmol/L (ref 135–145)
TOTAL PROTEIN: 8 g/dL (ref 6.5–8.1)

## 2017-11-01 LAB — CBC WITH DIFFERENTIAL/PLATELET
BASOS ABS: 0.1 10*3/uL (ref 0–0.1)
Basophils Relative: 1 %
Eosinophils Absolute: 0.4 10*3/uL (ref 0–0.7)
Eosinophils Relative: 6 %
HEMATOCRIT: 43.8 % (ref 40.0–52.0)
Hemoglobin: 15 g/dL (ref 13.0–18.0)
LYMPHS ABS: 1 10*3/uL (ref 1.0–3.6)
Lymphocytes Relative: 18 %
MCH: 31.5 pg (ref 26.0–34.0)
MCHC: 34.3 g/dL (ref 32.0–36.0)
MCV: 92.1 fL (ref 80.0–100.0)
MONO ABS: 0.7 10*3/uL (ref 0.2–1.0)
Monocytes Relative: 11 %
NEUTROS ABS: 3.7 10*3/uL (ref 1.4–6.5)
Neutrophils Relative %: 64 %
Platelets: 191 10*3/uL (ref 150–440)
RBC: 4.76 MIL/uL (ref 4.40–5.90)
RDW: 16 % — ABNORMAL HIGH (ref 11.5–14.5)
WBC: 5.8 10*3/uL (ref 3.8–10.6)

## 2017-11-01 LAB — TROPONIN I: Troponin I: <0.03 ng/mL (ref <0.03)

## 2017-11-01 LAB — URIC ACID: Uric Acid, Serum: 9.2 mg/dL — ABNORMAL HIGH (ref 3.7–8.6)

## 2017-11-01 MED ORDER — KETOROLAC TROMETHAMINE 30 MG/ML IJ SOLN
15.0000 mg | Freq: Once | INTRAMUSCULAR | Status: AC
  Administered 2017-11-01: 15 mg via INTRAVENOUS
  Filled 2017-11-01: qty 1

## 2017-11-01 MED ORDER — METHYLPREDNISOLONE SODIUM SUCC 125 MG IJ SOLR
125.0000 mg | Freq: Once | INTRAMUSCULAR | Status: AC
  Administered 2017-11-01: 125 mg via INTRAVENOUS
  Filled 2017-11-01: qty 2

## 2017-11-01 MED ORDER — VALACYCLOVIR HCL 500 MG PO TABS
1000.0000 mg | ORAL_TABLET | Freq: Once | ORAL | Status: AC
  Administered 2017-11-01: 1000 mg via ORAL
  Filled 2017-11-01: qty 2

## 2017-11-01 NOTE — ED Notes (Signed)
Pt given sandwich tray 

## 2017-11-01 NOTE — ED Notes (Signed)
Attempted to call daughter with phone number given by pt, phone number was not a valid number

## 2017-11-01 NOTE — ED Notes (Signed)
Pt moved to hospital bed with new linen.

## 2017-11-01 NOTE — ED Triage Notes (Signed)
Pt picked up by [place] [facility], ems states he was on the floor. Pt complaints today include rash on the right posterior and anterior chest, left knee pain that he states prevents him from walking, and right ankle/foot/leg pain. Hernia noted on naval, pt states no issues with that at this time.

## 2017-11-01 NOTE — NC FL2 (Addendum)
[place] MEDICAID FL2 LEVEL OF CARE SCREENING TOOL     IDENTIFICATION  Patient Name: [name] Birthdate: 10/26/1934 Sex: male Admission Date (Current Location): 11/01/2017  [place] and [place] Number:  Chiropodist and Address:  [facility], [address] [place], [place] [address]      Provider Number: [id number]  Attending Physician Name and Address:  Emily Filbert, MD  Relative Name and Phone Number:       Current Level of Care: Hospital Recommended Level of Care: Skilled Nursing Facility Prior Approval Number:    Date Approved/Denied:   PASRR Number: [id number])  Discharge Plan: SNF    Current Diagnoses: Patient Active Problem List   Diagnosis Date Noted  . Right knee pain 06/02/2017  . Post-traumatic osteoarthritis of multiple joints 04/28/2017  . Abnormality of plasma protein 03/25/2017  . Elevated beta-2 microglobulin 03/25/2017  . Right leg DVT (HCC) 03/15/2017  . GERD (gastroesophageal reflux disease) 03/10/2017  . Hyperlipidemia, unspecified 03/10/2017  . Peripheral arterial occlusive disease (HCC) 11/24/2016  . Bilateral lower extremity edema 10/19/2016  . Congestive heart failure (CHF) (HCC) 06/14/2016  . Hyperthyroidism 12/04/2015  . Unsteady gait 12/04/2015  . 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 Abnormal serum protein electrophoresis 12/05/2014  . Pressure ulcer 11/15/2014  . Hyperproteinemia 11/02/2014  . Hypoalbuminemia 11/02/2014  . Arthritis 10/23/2014  . Arteriosclerosis of coronary artery 10/23/2014  . Type 2 diabetes, controlled, with neuropathy (HCC) 10/23/2014  . Amputation of finger of left hand 10/23/2014  . History of pulmonary embolism 10/23/2014  . Literacy problem 10/23/2014  . Umbilical hernia without obstruction or gangrene 10/23/2014  . Peripheral neuropathy  10/23/2014  . Artificial cardiac pacemaker 08/10/2013  . H/O coronary artery bypass surgery 08/10/2013  . GIB (gastrointestinal bleeding) 07/18/2013  . Benign prostatic hyperplasia with urinary obstruction 09/27/2012  . Cardiomyopathy (HCC) 08/09/2011    Orientation RESPIRATION BLADDER Height & Weight     Self, Time, Situation, Place  Normal Continent Weight: 191 lb 12.8 oz (87 kg) Height:  6' (182.9 cm)  BEHAVIORAL SYMPTOMS/MOOD NEUROLOGICAL BOWEL NUTRITION STATUS      Continent Diet(Diet: Regular )  AMBULATORY STATUS COMMUNICATION OF NEEDS Skin   Extensive Assist Verbally Other (Comment)(Shigles on chest. )                       Personal Care Assistance Level of Assistance  Bathing, Feeding, Dressing Bathing Assistance: Limited assistance Feeding assistance: Independent Dressing Assistance: Limited assistance     Functional Limitations Info  Sight, Hearing, Speech Sight Info: Adequate Hearing Info: Adequate Speech Info: Adequate    SPECIAL CARE FACTORS FREQUENCY  PT (By licensed PT), OT (By licensed OT)     PT Frequency: (5) OT Frequency: (5)            Contractures      Additional Factors Info  Code Status, Allergies Code Status Info: (Full Code. ) Allergies Info: (Oxycodone-acetaminophen, Percocet Oxycodone-acetaminophen)           Current Medications (11/01/2017):  This is the current hospital active medication list No current facility-administered medications for this encounter.    Current Outpatient Medications  Medication Sig Dispense Refill  . acetaminophen (TYLENOL) 500 MG tablet Take 500 mg by mouth every 8 (eight) hours as needed for mild pain or fever.     [name] allopurinol (ZYLOPRIM) 100 MG tablet Take  1 tablet (100 mg total) by mouth daily.    [name]. apixaban (ELIQUIS) 5 MG TABS tablet Take 1 tablet (5 mg total) by mouth 2 (two) times daily. 60 tablet 2  . aspirin EC 81 MG tablet Take 1 tablet (81 mg total) by mouth daily. 30 tablet 11  .  atorvastatin (LIPITOR) 40 MG tablet Take 1 tablet (40 mg total) by mouth at bedtime. 90 tablet 1  . bisoprolol (ZEBETA) 5 MG tablet Take 1 tablet (5 mg total) by mouth daily.    Clinical research associate. Elastic Bandages & Supports (TRUFORM STOCKINGS 20-30MMHG) MISC Wear on both legs; put on first thing in the morning and remove before bed; do NOT sleep in them 2 each 0  . Elastic Bandages & Supports (WRIST BRACE/RIGHT LARGE) MISC Wear on the right wrist, cock-up wrist splint for carpal tunnel 1 each 0  . furosemide (LASIX) 20 MG tablet Take 1 tablet (20 mg total) by mouth daily. 30 tablet 0  . ibuprofen (ADVIL,MOTRIN) 600 MG tablet Take 1 tablet (600 mg total) by mouth every 6 (six) hours as needed for mild pain or moderate pain. Pt takes twice a day, am/pm 60 tablet 0  . mupirocin cream (BACTROBAN) 2 % Apply topically daily. 15 g 0  . ondansetron (ZOFRAN) 4 MG tablet Take 1 tablet (4 mg total) by mouth every 6 (six) hours as needed for nausea. 20 tablet 0  . pantoprazole (PROTONIX) 40 MG tablet Take 1 tablet (40 mg total) by mouth daily. 30 tablet 2  . potassium chloride (K-DUR,KLOR-CON) 10 MEQ tablet Take 1 tablet (10 mEq total) by mouth daily. 30 tablet 2     Discharge Medications: Please see discharge summary for a list of discharge medications.  Relevant Imaging Results:  Relevant Lab Results:   Additional Information (SSN: [id number])  [name], [name] M, LCSW

## 2017-11-01 NOTE — ED Provider Notes (Addendum)
[facility] Emergency Department Provider Note       Time seen: ----------------------------------------- 1:06 PM on 11/01/2017 -----------------------------------------   I have reviewed the triage vital signs and the nursing notes.  HISTORY   Chief Complaint Leg Pain and Rash    HPI LENA FIELDHOUSE is a 83 y.o. male with a history of arthritis, coronary disease, CHF, COPD, depression, GERD, osteomyelitis who presents to the ED for difficult he walking.  Patient was picked up by [facility].  EMS states he was on the floor.  Patient's complaints today can include a rash on the right side of his chest as well as left knee pain that keeps him from walking.  He is also having other pain in his left leg.  He denies any recent illness or other complaints.  Past Medical History:  Diagnosis Date  . Arthritis   . CAD (coronary artery disease)   . Congestive heart failure (CHF) (HCC) 06/14/2016   LVEF 40-45% 2016 echo; Dr. Darrold Junker  . COPD (chronic obstructive pulmonary disease) (HCC)   . Depression   . Family history of adverse reaction to anesthesia    mom seizures   . GERD (gastroesophageal reflux disease)   . Heart disease   . History of blood clots in legs   . 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 Literacy level of illiterate   . Osteomyelitis (HCC)   . PN (peripheral neuropathy)   . Psoriasis     Patient Active Problem List   Diagnosis Date Noted  . Right knee pain 06/02/2017  . Post-traumatic osteoarthritis of multiple joints 04/28/2017  . Abnormality of plasma protein 03/25/2017  . Elevated beta-2 microglobulin 03/25/2017  . Right leg DVT (HCC) 03/15/2017  . GERD (gastroesophageal reflux disease) 03/10/2017  . Hyperlipidemia, unspecified 03/10/2017  . Peripheral arterial occlusive disease (HCC) 11/24/2016  . Bilateral lower extremity edema 10/19/2016  . Congestive heart failure (CHF) (HCC) 06/14/2016  . Hyperthyroidism 12/04/2015  .  Unsteady gait 12/04/2015  . 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 Abnormal serum protein electrophoresis 12/05/2014  . Pressure ulcer 11/15/2014  . Hyperproteinemia 11/02/2014  . Hypoalbuminemia 11/02/2014  . Arthritis 10/23/2014  . Arteriosclerosis of coronary artery 10/23/2014  . Type 2 diabetes, controlled, with neuropathy (HCC) 10/23/2014  . Amputation of finger of left hand 10/23/2014  . History of pulmonary embolism 10/23/2014  . Literacy problem 10/23/2014  . Umbilical hernia without obstruction or gangrene 10/23/2014  . Peripheral neuropathy 10/23/2014  . Artificial cardiac pacemaker 08/10/2013  . H/O coronary artery bypass surgery 08/10/2013  . GIB (gastrointestinal bleeding) 07/18/2013  . Benign prostatic hyperplasia with urinary obstruction 09/27/2012  . Cardiomyopathy (HCC) 08/09/2011    Past Surgical History:  Procedure Laterality Date  . amputation 4th finger Left   . APPENDECTOMY    . CARDIAC SURGERY    . CLAVICLE SURGERY  2012   open reduction and internal fixation of left clavicle  . CORONARY ARTERY BYPASS GRAFT  2002  . FOOT SURGERY    . IMPLANTABLE CARDIOVERTER DEFIBRILLATOR (ICD) GENERATOR CHANGE N/A 12/03/2015   Procedure: ICD GENERATOR CHANGE;  Surgeon: Marcina Millard, MD;  Location: [facility] ORS;  Service: Cardiovascular;  Laterality: N/A;  . PACEMAKER INSERTION    . PERIPHERAL VASCULAR BALLOON ANGIOPLASTY Right 07/12/2017   Procedure: PERIPHERAL VASCULAR BALLOON ANGIOPLASTY;  Surgeon: Renford Dills, MD;  Location: [facility] INVASIVE CV LAB;  Service: Cardiovascular;  Laterality: Right;  Allergies Oxycodone-acetaminophen and Percocet [oxycodone-acetaminophen]  Social History Social History   Tobacco Use  . Smoking status: Former Smoker    Packs/day: 0.25    Years: 10.00    Pack years: 2.50    Types: Cigarettes, Cigars    Last  attempt to quit: 10/18/1979    Years since quitting: 38.0  . Smokeless tobacco: Never Used  Substance Use Topics  . Alcohol use: No  . Drug use: No   Review of Systems Constitutional: Negative for fever. Cardiovascular: Negative for chest pain. Respiratory: Negative for shortness of breath. Gastrointestinal: Negative for abdominal pain, vomiting and diarrhea. Musculoskeletal: Positive for left leg and left knee pain Skin: Positive for right-sided chest rash and pain Neurological: Negative for headaches, focal weakness or numbness.  All systems negative/normal/unremarkable except as stated in the HPI  ____________________________________________   PHYSICAL EXAM:  VITAL SIGNS: ED Triage Vitals  Enc Vitals Group     BP 11/01/17 1200 120/63     Pulse Rate 11/01/17 1200 60     Resp --      Temp 11/01/17 1200 (!) 97.5 F (36.4 C)     Temp Source 11/01/17 1200 Oral     SpO2 11/01/17 1158 95 %     Weight 11/01/17 1202 191 lb 12.8 oz (87 kg)     Height 11/01/17 1202 6' (1.829 m)     Head Circumference --      Peak Flow --      Pain Score 11/01/17 1200 6     Pain Loc --      Pain Edu? --      Excl. in GC? --    Constitutional: Alert and oriented. Well appearing and in no distress. Eyes: Conjunctivae are normal. Normal extraocular movements. ENT   Head: Normocephalic and atraumatic.   Nose: No congestion/rhinnorhea.   Mouth/Throat: Mucous membranes are moist.   Neck: No stridor. Cardiovascular: Normal rate, regular rhythm. No murmurs, rubs, or gallops. Respiratory: Normal respiratory effort without tachypnea nor retractions. Breath sounds are clear and equal bilaterally. No wheezes/rales/rhonchi. Gastrointestinal: Soft and nontender. Normal bowel sounds.  Chronic umbilical hernia is noted. Musculoskeletal: Pain and limited range of motion of his extremities.  Right lower extremity is chronically elevated compared to left.  Pain with range of motion of the left  knee. Neurologic:  Normal speech and language. No gross focal neurologic deficits are appreciated.  Skin: Vesicular rash with erythema around T4 on the right side, this corresponds with a T4 distribution erythema and rash posteriorly as well Psychiatric: Mood and affect are normal. Speech and behavior are normal.  ____________________________________________  ED COURSE:  As part of my medical decision making, I reviewed the following data within the electronic MEDICAL RECORD NUMBER History obtained from family if available, nursing notes, old chart and ekg, as well as notes from prior ED visits. Patient presented for weakness, we will assess with labs and imaging as indicated at this time.   Procedures ____________________________________________   LABS (pertinent positives/negatives)  Labs Reviewed  CBC WITH DIFFERENTIAL/PLATELET - Abnormal; Notable for the following components:      Result Value   RDW 16.0 (*)    All other components within normal limits  COMPREHENSIVE METABOLIC PANEL - Abnormal; Notable for the following components:   Glucose, Bld 111 (*)    GFR calc non Af Amer 57 (*)    Anion gap 4 (*)    All other components within normal limits  URIC ACID - Abnormal; Notable for  the following components:   Uric Acid, Serum 9.2 (*)    All other components within normal limits  TROPONIN I  URINALYSIS, COMPLETE (UACMP) WITH MICROSCOPIC  ____________________________________________  EKG: Interpreted by me, AV dual paced rhythm with a rate of 62 bpm, some inhibition is noted  DIFFERENTIAL DIAGNOSIS   Dehydration, electrolyte abnormality, gout, shingles, occult infection  FINAL ASSESSMENT AND PLAN  Weakness, shingles, gout   Plan: The patient had presented for difficulty walking and was noted to have shingles on the right chest. Patient's labs did not reveal any acute process, uric acid was elevated which likely indicate some degree of gouty arthropathy.  He was given  prednisone as well as valacyclovir for shingles.  Prednisone will likely help his gout as well.  Patient states that he does not have anyone to take care of him at home and cannot go home.  We will consult social work for placement.   Ulice DashJohnathan E Williams, MD   Note: This note was generated in part or whole with voice recognition software. Voice recognition is usually quite accurate but there are transcription errors that can and very often do occur. I apologize for any typographical errors that were not detected and corrected.     Emily FilbertWilliams, Jonathan E, MD 11/01/17 1455    Emily FilbertWilliams, Jonathan E, MD 11/01/17 1456    Emily FilbertWilliams, Jonathan E, MD 11/22/17 [phone]

## 2017-11-01 NOTE — Progress Notes (Signed)
Clinical Social Worker (CSW) received consult from MD that patient needs to go to SNF because he can't walk. Per MD PT has been ordered and is pending. CSW made MD aware that patient has [employer]a and could take several days to make a decision about paying for SNF. Per MD patient will have to remain in the ED because it is unsafe to D/C home. FL2 complete and faxed out.  Per [facility] admissions coordinator at [facility] she can accept patient and start [facility] authorization today.   CSW met with patient and made him aware of above. Patient accepted bed offer from [facility]. Per patient he lives in [place] with his son however his son works most of the day and can't provide care for him. CSW explained to patient that Mcarthur Rossetti will have to approve SNF. Patient verbalized his understanding.   [employer], LCSW [phone]

## 2017-11-02 MED ORDER — PANTOPRAZOLE SODIUM 40 MG PO TBEC
40.0000 mg | DELAYED_RELEASE_TABLET | Freq: Every day | ORAL | Status: DC
  Administered 2017-11-02: 40 mg via ORAL
  Filled 2017-11-02: qty 1

## 2017-11-02 MED ORDER — ATORVASTATIN CALCIUM 20 MG PO TABS: 40.0000 mg | ORAL_TABLET | Freq: Every day | ORAL | Status: DC

## 2017-11-02 MED ORDER — BISOPROLOL FUMARATE 5 MG PO TABS
5.0000 mg | ORAL_TABLET | Freq: Every day | ORAL | Status: DC
  Administered 2017-11-02: 5 mg via ORAL
  Filled 2017-11-02: qty 1

## 2017-11-02 NOTE — Evaluation (Signed)
Physical Therapy Evaluation Patient Details Name: [name] MRN: [id number] DOB: Jun 17, 1934 Today's Date: 11/02/2017   History of Present Illness  presented to ER secondary to fall, LE pain/weakness.  Noted with rash to right chest (possible shingles?)  Clinical Impression  Upon evaluation, patient alert and oriented; follows commands.  Very hyperverbal (and perseverative on multiple medical comorbities, pains); constant cuing for redirection to task/conversation at hand.  Demonstrates ability to complete bed mobility with mod indep; sit/stand, basic transfers and gait (125') with RW, cga/close sup.  Mod UE reliance on RW with use of RW, with noted improvement in gait mechanics, safety and overall fluidity with use of assist device (tends to 'wall walk' or 'furniture cruise' during trial without assist device).  Do recommend continued use of RW at discharge; patient aware of recommendations.  Generally non-complaint with recommendations and suggestions/feedback provided by therapist throughout session. Would benefit from skilled PT to address above deficits and promote optimal return to PLOF; Recommend transition to HHPT, HHRN, [facility] upon discharge from acute hospitalization.'    Follow Up Recommendations [facility]    Equipment Recommendations  Rolling walker with 5" wheels    Recommendations for Other Services       Precautions / Restrictions Precautions Precautions: Fall Restrictions Weight Bearing Restrictions: No      Mobility  Bed Mobility Overal bed mobility: Modified Independent                Transfers Overall transfer level: Needs assistance Equipment used: Rolling walker (2 wheeled) Transfers: Sit to/from Stand Sit to Stand: Min guard;Supervision         General transfer comment: tends to pull on RW despite cuing  Ambulation/Gait Ambulation/Gait assistance: Min Emergency planning/management officerguard;Supervision Gait Distance (Feet): 25 Feet Assistive device:  None     Gait velocity interpretation: <1.31 ft/sec, indicative of household ambulator General Gait Details: inconsistent step height/lenght, slightly staggered with decreased weight acceptance/stance time R LE; prefers to reach for walls/furniture  Stairs            Wheelchair Mobility    Modified Rankin (Stroke Patients Only)       Balance Overall balance assessment: Needs assistance Sitting-balance support: No upper extremity supported;Feet supported Sitting balance-Leahy Scale: Good     Standing balance support: No upper extremity supported Standing balance-Leahy Scale: Fair Standing balance comment: maintains static stance without UE support, sup; limited functional reach outside immediate BOS                             Pertinent Vitals/Pain Pain Assessment: Faces Faces Pain Scale: Hurts a little bit Pain Location: R LE Pain Descriptors / Indicators: Aching;Grimacing;Guarding Pain Intervention(s): Limited activity within patient's tolerance;Monitored during session;Repositioned    Home Living Family/patient expects to be discharged to:: Private residence Living Arrangements: Alone Available Help at Discharge: Family;Available PRN/intermittently Type of Home: Apartment Home Access: Level entry     Home Layout: One level Home Equipment: Walker - standard;Cane - single point      Prior Function Level of Independence: Independent         Comments: Indep with ADLs, household moibilization; does endorse 'furniture walking' to maximize balance/safety.  Endorses multiple fall history, though unable to fully quantify/describe.     Hand Dominance   Dominant Hand: Right    Extremity/Trunk Assessment   Upper Extremity Assessment Upper Extremity Assessment: Overall WFL for tasks assessed    Lower Extremity Assessment Lower Extremity Assessment:  Overall WFL for tasks assessed(grossly at least 4/5 throughout)       Communication    Communication: No difficulties(very hyperverbal, perseverative on medical comorbidities; constant cuing for redirection to task/conversation at hand)  Cognition Arousal/Alertness: Awake/alert Behavior During Therapy: WFL for tasks assessed/performed Overall Cognitive Status: Within Functional Limits for tasks assessed                                        General Comments      Exercises Other Exercises Other Exercises: 125' with RW, cga/close sup-improved step height/length and overall gait fluidity/stability when using assist device; completes turns and changes of direction without LOB.  Do recommend continued use of RW at discharge; patient aware of recommendations, limited compliance.   Assessment/Plan    PT Assessment Patient needs continued PT services  PT Problem List Decreased activity tolerance;Decreased balance;Decreased mobility;Decreased coordination;Decreased cognition;Decreased knowledge of use of DME;Decreased safety awareness;Decreased knowledge of precautions       PT Treatment Interventions DME instruction;Gait training;Functional mobility training;Balance training;Therapeutic activities;Patient/family education;Therapeutic exercise    PT Goals (Current goals can be found in the Care Plan section)  Acute Rehab PT Goals Patient Stated Goal: to make my legs better PT Goal Formulation: With patient Time For Goal Achievement: 11/16/17 Potential to Achieve Goals: Good    Frequency Min 2X/week   Barriers to discharge Decreased caregiver support      Co-evaluation               AM-PAC PT "6 Clicks" Daily Activity  Outcome Measure Difficulty turning over in bed (including adjusting bedclothes, sheets and blankets)?: None Difficulty moving from lying on back to sitting on the side of the bed? : None Difficulty sitting down on and standing up from a chair with arms (e.g., wheelchair, bedside commode, etc,.)?: Unable Help needed moving to and  from a bed to chair (including a wheelchair)?: A Little Help needed walking in hospital room?: A Little Help needed climbing 3-5 steps with a railing? : A Little 6 Click Score: 18    End of Session Equipment Utilized During Treatment: Gait belt Activity Tolerance: Patient tolerated treatment well Patient left: in bed;with call bell/phone within reach;with bed alarm set Nurse Communication: Mobility status PT Visit Diagnosis: Unsteadiness on feet (R26.81);Muscle weakness (generalized) (M62.81);History of falling (Z91.81)    Time: 1610-9604 PT Time Calculation (min) (ACUTE ONLY): 18 min   Charges:   PT Evaluation $PT Eval Low Complexity: 1 Low PT Treatments $Gait Training: 8-22 mins        Colten Desroches H. Manson Passey, PT, DPT, NCS 11/02/17, 9:25 AM [phone]

## 2017-11-02 NOTE — Progress Notes (Signed)
LCSW spoke with EDP and she understands the patient is to return home with home health and placed a care mgmt consult. No further SW needs unless needs change.  Shundra Wirsing LCSW

## 2017-11-02 NOTE — Care Management (Addendum)
Spoke with Dr. Sharma CovertNorman in the emergency room. Physical therapy is recommending home with home Health. Spoke with [place] at [facility]. States they ended their services in June 2019. They can't take him back due to staffing issues. Spoke with Feliberto GottronJason Hinton, [facility] representative, Will see if [facility] can accept. Will need registered nurse added to services in the home. Gwenette GreetBrenda S Aixa Corsello RN MSN CCM Care Management [phone]

## 2017-11-02 NOTE — ED Notes (Signed)
Pt given large cup of water and cup of ice.

## 2017-11-02 NOTE — ED Notes (Signed)
Pt given breakfast tray and coffee 

## 2017-11-02 NOTE — Discharge Instructions (Addendum)
Return to the emergency department for any symptoms concerning to you. °

## 2017-11-02 NOTE — ED Notes (Signed)
Pt given meal tray.

## 2017-11-02 NOTE — ED Notes (Signed)
Meal Tray provided  

## 2017-11-02 NOTE — Progress Notes (Signed)
PT is recommending home health, [facility] will not cover SNF. Clinical Child psychotherapistocial Worker (CSW) made ED CSW aware and RN Sports coachcase manager. Please reconsult if future social work needs arise. CSW signing off.   Baker Hughes IncorporatedBailey Derryck Shahan, LCSW [phone]

## 2017-11-02 NOTE — ED Notes (Signed)
PT at bedside.

## 2017-11-04 DIAGNOSIS — J449 Chronic obstructive pulmonary disease, unspecified: Secondary | ICD-10-CM | POA: Diagnosis not present

## 2017-11-04 DIAGNOSIS — I509 Heart failure, unspecified: Secondary | ICD-10-CM | POA: Diagnosis not present

## 2017-11-04 DIAGNOSIS — I2510 Atherosclerotic heart disease of native coronary artery without angina pectoris: Secondary | ICD-10-CM | POA: Diagnosis not present

## 2017-11-04 DIAGNOSIS — F329 Major depressive disorder, single episode, unspecified: Secondary | ICD-10-CM | POA: Diagnosis not present

## 2017-11-04 DIAGNOSIS — B028 Zoster with other complications: Secondary | ICD-10-CM | POA: Diagnosis not present

## 2017-11-04 DIAGNOSIS — M1009 Idiopathic gout, multiple sites: Secondary | ICD-10-CM | POA: Diagnosis not present

## 2017-11-04 DIAGNOSIS — R419 Unspecified symptoms and signs involving cognitive functions and awareness: Secondary | ICD-10-CM | POA: Diagnosis not present

## 2017-11-04 DIAGNOSIS — S069X9S Unspecified intracranial injury with loss of consciousness of unspecified duration, sequela: Secondary | ICD-10-CM | POA: Diagnosis not present

## 2017-11-04 DIAGNOSIS — E119 Type 2 diabetes mellitus without complications: Secondary | ICD-10-CM | POA: Diagnosis not present

## 2017-11-06 ENCOUNTER — Emergency Department
Admission: EM | Admit: 2017-11-06 | Discharge: 2017-11-06 | Disposition: A | Discharge status: Home / Self Care | Payer: Medicare HMO | Attending: Emergency Medicine | Admitting: Emergency Medicine

## 2017-11-06 ENCOUNTER — Other Ambulatory Visit: Payer: Self-pay

## 2017-11-06 DIAGNOSIS — I2510 Atherosclerotic heart disease of native coronary artery without angina pectoris: Secondary | ICD-10-CM | POA: Diagnosis not present

## 2017-11-06 DIAGNOSIS — Z7901 Long term (current) use of anticoagulants: Secondary | ICD-10-CM | POA: Diagnosis not present

## 2017-11-06 DIAGNOSIS — M25561 Pain in right knee: Secondary | ICD-10-CM | POA: Diagnosis not present

## 2017-11-06 DIAGNOSIS — E119 Type 2 diabetes mellitus without complications: Secondary | ICD-10-CM | POA: Insufficient documentation

## 2017-11-06 DIAGNOSIS — Z950 Presence of cardiac pacemaker: Secondary | ICD-10-CM | POA: Insufficient documentation

## 2017-11-06 DIAGNOSIS — M25562 Pain in left knee: Secondary | ICD-10-CM | POA: Diagnosis not present

## 2017-11-06 DIAGNOSIS — B029 Zoster without complications: Secondary | ICD-10-CM

## 2017-11-06 DIAGNOSIS — F329 Major depressive disorder, single episode, unspecified: Secondary | ICD-10-CM | POA: Diagnosis not present

## 2017-11-06 DIAGNOSIS — Z87891 Personal history of nicotine dependence: Secondary | ICD-10-CM | POA: Insufficient documentation

## 2017-11-06 DIAGNOSIS — M25571 Pain in right ankle and joints of right foot: Secondary | ICD-10-CM | POA: Diagnosis not present

## 2017-11-06 DIAGNOSIS — Z79899 Other long term (current) drug therapy: Secondary | ICD-10-CM | POA: Diagnosis not present

## 2017-11-06 DIAGNOSIS — J449 Chronic obstructive pulmonary disease, unspecified: Secondary | ICD-10-CM | POA: Diagnosis not present

## 2017-11-06 DIAGNOSIS — M109 Gout, unspecified: Secondary | ICD-10-CM | POA: Diagnosis not present

## 2017-11-06 DIAGNOSIS — I509 Heart failure, unspecified: Secondary | ICD-10-CM | POA: Insufficient documentation

## 2017-11-06 DIAGNOSIS — M25572 Pain in left ankle and joints of left foot: Secondary | ICD-10-CM | POA: Diagnosis not present

## 2017-11-06 DIAGNOSIS — Z951 Presence of aortocoronary bypass graft: Secondary | ICD-10-CM | POA: Insufficient documentation

## 2017-11-06 MED ORDER — ONDANSETRON HCL 4 MG PO TABS
4.0000 mg | ORAL_TABLET | Freq: Once | ORAL | Status: AC
  Administered 2017-11-06: 4 mg via ORAL
  Filled 2017-11-06: qty 1

## 2017-11-06 MED ORDER — MORPHINE SULFATE (PF) 4 MG/ML IV SOLN
4.0000 mg | Freq: Once | INTRAVENOUS | Status: AC
  Administered 2017-11-06: 4 mg via INTRAMUSCULAR
  Filled 2017-11-06: qty 1

## 2017-11-06 NOTE — ED Provider Notes (Signed)
[facility] Emergency Department Provider Note  ___________________________________________   First MD Initiated Contact with Patient 11/06/17 0201     (approximate)  I have reviewed the triage vital signs and the nursing notes.   HISTORY  Chief Complaint Gout   HPI [name] is a 83 y.o. male with a history of gout and arthritis was presented emergency department bilateral knee as well as ankle pain.  He says that he was here several days ago and treated for his gout with prednisone and discharged home.  However, he says that the pain is been persistent and not worsening.  Says that the swelling has gone down to his bilateral lower extremities.   Past Medical History:  Diagnosis Date  . Arthritis   . CAD (coronary artery disease)   . Congestive heart failure (CHF) (HCC) 06/14/2016   LVEF 40-45% 2016 echo; Dr. Darrold Junker  . COPD (chronic obstructive pulmonary disease) (HCC)   . Depression   . Family history of adverse reaction to anesthesia    mom seizures   . GERD (gastroesophageal reflux disease)   . Heart disease   . History of blood clots in legs   . 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 Literacy level of illiterate   . Osteomyelitis (HCC)   . PN (peripheral neuropathy)   . Psoriasis     Patient Active Problem List   Diagnosis Date Noted  . Right knee pain 06/02/2017  . Post-traumatic osteoarthritis of multiple joints 04/28/2017  . Abnormality of plasma protein 03/25/2017  . Elevated beta-2 microglobulin 03/25/2017  . Right leg DVT (HCC) 03/15/2017  . GERD (gastroesophageal reflux disease) 03/10/2017  . Hyperlipidemia, unspecified 03/10/2017  . Peripheral arterial occlusive disease (HCC) 11/24/2016  . Bilateral lower extremity edema 10/19/2016  . Congestive heart failure (CHF) (HCC) 06/14/2016  . Hyperthyroidism 12/04/2015  . Unsteady gait 12/04/2015  . 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 Abnormal serum protein electrophoresis 12/05/2014  . Pressure ulcer 11/15/2014  . Hyperproteinemia 11/02/2014  . Hypoalbuminemia 11/02/2014  . Arthritis 10/23/2014  . Arteriosclerosis of coronary artery 10/23/2014  . Type 2 diabetes, controlled, with neuropathy (HCC) 10/23/2014  . Amputation of finger of left hand 10/23/2014  . History of pulmonary embolism 10/23/2014  . Literacy problem 10/23/2014  . Umbilical hernia without obstruction or gangrene 10/23/2014  . Peripheral neuropathy 10/23/2014  . Artificial cardiac pacemaker 08/10/2013  . H/O coronary artery bypass surgery 08/10/2013  . GIB (gastrointestinal bleeding) 07/18/2013  . Benign prostatic hyperplasia with urinary obstruction 09/27/2012  . Cardiomyopathy (HCC) 08/09/2011    Past Surgical History:  Procedure Laterality Date  . amputation 4th finger Left   . APPENDECTOMY    . CARDIAC SURGERY    . CLAVICLE SURGERY  2012   open reduction and internal fixation of left clavicle  . CORONARY ARTERY BYPASS GRAFT  2002  . FOOT SURGERY    . IMPLANTABLE CARDIOVERTER DEFIBRILLATOR (ICD) GENERATOR CHANGE N/A 12/03/2015   Procedure: ICD GENERATOR CHANGE;  Surgeon: Marcina Millard, MD;  Location: [facility] ORS;  Service: Cardiovascular;  Laterality: N/A;  . PACEMAKER INSERTION    . PERIPHERAL VASCULAR BALLOON ANGIOPLASTY Right 07/12/2017   Procedure: PERIPHERAL VASCULAR BALLOON ANGIOPLASTY;  Surgeon: Renford Dills, MD;  Location: [facility] INVASIVE CV LAB;  Service: Cardiovascular;  Laterality: Right;    Prior to Admission medications   Medication Sig Start Date End Date Taking? Authorizing Provider  acetaminophen (  TYLENOL) 500 MG tablet Take 500 mg by mouth every 8 (eight) hours as needed for mild pain or fever.     [provider]  allopurinol (ZYLOPRIM) 100 MG tablet Take 1 tablet (100 mg total) by mouth daily. Patient not taking: Reported on  11/02/2017 06/05/17   Ramonita LabGouru, Aruna, MD  apixaban (ELIQUIS) 5 MG TABS tablet Take 1 tablet (5 mg total) by mouth 2 (two) times daily. 06/29/17   Karamalegos, Netta NeatAlexander J, DO  aspirin EC 81 MG tablet Take 1 tablet (81 mg total) by mouth daily. Patient not taking: Reported on 11/02/2017 05/11/16   Kerman PasseyLada, Melinda P, MD  atorvastatin (LIPITOR) 40 MG tablet Take 1 tablet (40 mg total) by mouth at bedtime. 12/17/16   Kerman PasseyLada, Melinda P, MD  bisoprolol (ZEBETA) 5 MG tablet Take 1 tablet (5 mg total) by mouth daily. 06/05/17   Ramonita LabGouru, Aruna, MD  Elastic Bandages & Supports (TRUFORM STOCKINGS 20-30MMHG) MISC Wear on both legs; put on first thing in the morning and remove before bed; do NOT sleep in them 03/24/17   [name], [name], MD  Elastic Bandages & Supports (WRIST BRACE/RIGHT LARGE) MISC Wear on the right wrist, cock-up wrist splint for carpal tunnel 03/24/17   Kerman PasseyLada, Melinda P, MD  furosemide (LASIX) 20 MG tablet Take 1 tablet (20 mg total) by mouth daily. 09/15/17   Karamalegos, Netta NeatAlexander J, DO  ibuprofen (ADVIL,MOTRIN) 600 MG tablet Take 1 tablet (600 mg total) by mouth every 6 (six) hours as needed for mild pain or moderate pain. Pt takes twice a day, am/pm Patient not taking: Reported on 11/02/2017 07/13/17   Delfino LovettShah, Vipul, MD  mupirocin cream (BACTROBAN) 2 % Apply topically daily. Patient not taking: Reported on 11/02/2017 06/05/17   Ramonita LabGouru, Aruna, MD  ondansetron (ZOFRAN) 4 MG tablet Take 1 tablet (4 mg total) by mouth every 6 (six) hours as needed for nausea. Patient not taking: Reported on 11/02/2017 06/04/17   Ramonita LabGouru, Aruna, MD  pantoprazole (PROTONIX) 40 MG tablet Take 1 tablet (40 mg total) by mouth daily. 07/18/17   Karamalegos, Netta NeatAlexander J, DO  potassium chloride (K-DUR,KLOR-CON) 10 MEQ tablet Take 1 tablet (10 mEq total) by mouth daily. 09/15/17   Smitty CordsKaramalegos, Alexander J, DO    Allergies Oxycodone-acetaminophen and Percocet [oxycodone-acetaminophen]  Family History  Problem Relation Age of Onset  . Cancer Mother          spine  . Hypertension Mother   . Mental illness Mother   . Cancer Father        lung  . Asthma Father   . Allergic rhinitis Father   . Arthritis Father   . Tuberculosis Father   . Heart disease Father   . Gout Brother   . Allergic rhinitis Brother     Social History Social History   Tobacco Use  . Smoking status: Former Smoker    Packs/day: 0.25    Years: 10.00    Pack years: 2.50    Types: Cigarettes, Cigars    Last attempt to quit: 10/18/1979    Years since quitting: 38.0  . Smokeless tobacco: Never Used  Substance Use Topics  . Alcohol use: No  . Drug use: No    Review of Systems  Constitutional: No fever/chills Eyes: No visual changes. ENT: No sore throat. Cardiovascular: Denies chest pain. Respiratory: Denies shortness of breath. Gastrointestinal: No abdominal pain.  No nausea, no vomiting.  No diarrhea.  No constipation. Genitourinary: Negative for dysuria. Musculoskeletal: Negative for back pain. Skin:  Negative for rash. Neurological: Negative for headaches, focal weakness or numbness.   ____________________________________________   PHYSICAL EXAM:  VITAL SIGNS: ED Triage Vitals  Enc Vitals Group     BP 11/06/17 0201 (!) 149/80     Pulse Rate 11/06/17 0201 79     Resp 11/06/17 0201 16     Temp 11/06/17 0201 97.9 F (36.6 C)     Temp Source 11/06/17 0201 Oral     SpO2 11/06/17 0201 98 %     Weight 11/06/17 0202 191 lb (86.6 kg)     Height 11/06/17 0202 5' 10.5" (1.791 m)     Head Circumference --      Peak Flow --      Pain Score 11/06/17 0202 10     Pain Loc --      Pain Edu? --      Excl. in GC? --     Constitutional: Alert and oriented. Well appearing and in no acute distress. Eyes: Conjunctivae are normal.  Head: Atraumatic. Nose: No congestion/rhinnorhea. Mouth/Throat: Mucous membranes are moist.  Neck: No stridor.   Cardiovascular: Normal rate, regular rhythm. Grossly normal heart sounds.   Respiratory: Normal respiratory  effort.  No retractions. Lungs CTAB. Gastrointestinal: Soft and nontender. No distention. No CVA tenderness. Musculoskeletal: Tenderness palpation of the bilateral knees and ankles.  Right lower extremity with moderate edema and mild edema to left lower examinee.  Patient says that these are chronic changes and improved from previous.  No effusion, no erythema, induration or warmth.  Dorsalis pedis pulses are present and equal bilaterally.  Neurologic:  Normal speech and language. No gross focal neurologic deficits are appreciated. Skin: Several vesicles to the right side of the chest as well as the right thoracic back just with the patient's previously diagnosed shingles.  Patient says that the area is decreased and the symptoms are improved. Psychiatric: Mood and affect are normal. Speech and behavior are normal.  ____________________________________________   LABS (all labs ordered are listed, but only abnormal results are displayed)  Labs Reviewed - No data to display ____________________________________________  EKG   ____________________________________________  RADIOLOGY   ____________________________________________   PROCEDURES  Procedure(s) performed:   Procedures  Critical Care performed:   ____________________________________________   INITIAL IMPRESSION / ASSESSMENT AND PLAN / ED COURSE  Pertinent labs & imaging results that were available during my care of the patient were reviewed by me and considered in my medical decision making (see chart for details).  DDX: Gout, arthritis, chronic pain, DVT, cellulitis As part of my medical decision making, I reviewed the following data within the electronic MEDICAL RECORD NUMBER Notes from prior ED visits  ----------------------------------------- 5:05 AM on 11/06/2017 -----------------------------------------  Patient received morphine and now without any outward signs of pain.  However, he is complaining about being  discharged home and being cared for at home.  Patient with recurrent social issues.  Will consult social work. ____________________________________________   FINAL CLINICAL IMPRESSION(S) / ED DIAGNOSES  Gout.  NEW MEDICATIONS STARTED DURING THIS VISIT:  New Prescriptions   No medications on file     Note:  This document was prepared using Dragon voice recognition software and may include unintentional dictation errors.     Myrna Blazer, MD 11/06/17 [phone]

## 2017-11-06 NOTE — ED Notes (Signed)
While pushing pt outside (per his request) to await taxi he was yelling and cursing the Child psychotherapistsocial worker, all staff here, all medical staff, and the hospital - he was belligerent and uncooperative with discharge stating that he needed to be in a nursing facility and "not one of you mother fuckers care that I can't fucking take care of my God damn self" - attempted to calm pt - by the time we arrived to front door pt thanked me for the caring about him getting home safely

## 2017-11-06 NOTE — ED Triage Notes (Signed)
Pt stated that he is having gout flare up and he came and was seen two day ago for the same and was discharge but did not get any RX

## 2017-11-06 NOTE — Progress Notes (Signed)
LCSW reviewed the in home supports the patient he receives and he reported he is unhappy with their services. LCSW reviewed that he is to follow up with his community doctor for any increased services. He wishes to be placed in skilled nursing facility  And LCSW reviewed that he does not meet inpatient criteria. LCSW empowered patient to utilize all the community services available to him as he has a Child psychotherapistsocial worker in his home health services. No further needs  LCSW consulted EDP and EDRN and ED Charge and it is safe to send patient by cab as he has been medically cleared and requires no further hospital supports.  Delta Air LinesClaudine Mcihael Hinderman LCSW [phone]

## 2017-11-06 NOTE — Progress Notes (Signed)
LCSW reviewed notes and patient does not meet criteria for skilled nursing facility. He has home health and had PT already set up in his home.  In review of notes this patient is to DC home. Will consult with ED RN and EDP on this patient.  [employer] Air LinesClaudine Shamarion Coots LCSW [phone]

## 2017-11-06 NOTE — ED Notes (Signed)
Pt to be discharged home per social worker - ED secretary is aware and pt will have a cab called for transport to his home

## 2017-11-06 NOTE — ED Notes (Addendum)
Pt requested breakfast tray stating that he always get breakfast when he is in the ED - advised pt no diet order had been placed for him but that I could get him graham crackers and peanut butter - pt stated that he did not want this and that he would wait until sandwich tray came up at lunch Pt advised we were awaiting social work consult in regards to discharge

## 2017-11-07 DIAGNOSIS — I509 Heart failure, unspecified: Secondary | ICD-10-CM | POA: Diagnosis not present

## 2017-11-07 DIAGNOSIS — S069X9S Unspecified intracranial injury with loss of consciousness of unspecified duration, sequela: Secondary | ICD-10-CM | POA: Diagnosis not present

## 2017-11-07 DIAGNOSIS — R419 Unspecified symptoms and signs involving cognitive functions and awareness: Secondary | ICD-10-CM | POA: Diagnosis not present

## 2017-11-07 DIAGNOSIS — F329 Major depressive disorder, single episode, unspecified: Secondary | ICD-10-CM | POA: Diagnosis not present

## 2017-11-07 DIAGNOSIS — I2510 Atherosclerotic heart disease of native coronary artery without angina pectoris: Secondary | ICD-10-CM | POA: Diagnosis not present

## 2017-11-07 DIAGNOSIS — J449 Chronic obstructive pulmonary disease, unspecified: Secondary | ICD-10-CM | POA: Diagnosis not present

## 2017-11-07 DIAGNOSIS — E119 Type 2 diabetes mellitus without complications: Secondary | ICD-10-CM | POA: Diagnosis not present

## 2017-11-07 DIAGNOSIS — B028 Zoster with other complications: Secondary | ICD-10-CM | POA: Diagnosis not present

## 2017-11-07 DIAGNOSIS — M1009 Idiopathic gout, multiple sites: Secondary | ICD-10-CM | POA: Diagnosis not present

## 2017-11-08 DIAGNOSIS — F329 Major depressive disorder, single episode, unspecified: Secondary | ICD-10-CM | POA: Diagnosis not present

## 2017-11-08 DIAGNOSIS — M1009 Idiopathic gout, multiple sites: Secondary | ICD-10-CM | POA: Diagnosis not present

## 2017-11-08 DIAGNOSIS — J449 Chronic obstructive pulmonary disease, unspecified: Secondary | ICD-10-CM | POA: Diagnosis not present

## 2017-11-08 DIAGNOSIS — I2510 Atherosclerotic heart disease of native coronary artery without angina pectoris: Secondary | ICD-10-CM | POA: Diagnosis not present

## 2017-11-08 DIAGNOSIS — R419 Unspecified symptoms and signs involving cognitive functions and awareness: Secondary | ICD-10-CM | POA: Diagnosis not present

## 2017-11-08 DIAGNOSIS — S069X9S Unspecified intracranial injury with loss of consciousness of unspecified duration, sequela: Secondary | ICD-10-CM | POA: Diagnosis not present

## 2017-11-08 DIAGNOSIS — B028 Zoster with other complications: Secondary | ICD-10-CM | POA: Diagnosis not present

## 2017-11-08 DIAGNOSIS — I509 Heart failure, unspecified: Secondary | ICD-10-CM | POA: Diagnosis not present

## 2017-11-08 DIAGNOSIS — E119 Type 2 diabetes mellitus without complications: Secondary | ICD-10-CM | POA: Diagnosis not present

## 2017-11-09 DIAGNOSIS — I509 Heart failure, unspecified: Secondary | ICD-10-CM | POA: Diagnosis not present

## 2017-11-09 DIAGNOSIS — M1009 Idiopathic gout, multiple sites: Secondary | ICD-10-CM | POA: Diagnosis not present

## 2017-11-09 DIAGNOSIS — J449 Chronic obstructive pulmonary disease, unspecified: Secondary | ICD-10-CM | POA: Diagnosis not present

## 2017-11-09 DIAGNOSIS — S069X9S Unspecified intracranial injury with loss of consciousness of unspecified duration, sequela: Secondary | ICD-10-CM | POA: Diagnosis not present

## 2017-11-09 DIAGNOSIS — F329 Major depressive disorder, single episode, unspecified: Secondary | ICD-10-CM | POA: Diagnosis not present

## 2017-11-09 DIAGNOSIS — B028 Zoster with other complications: Secondary | ICD-10-CM | POA: Diagnosis not present

## 2017-11-09 DIAGNOSIS — E119 Type 2 diabetes mellitus without complications: Secondary | ICD-10-CM | POA: Diagnosis not present

## 2017-11-09 DIAGNOSIS — R419 Unspecified symptoms and signs involving cognitive functions and awareness: Secondary | ICD-10-CM | POA: Diagnosis not present

## 2017-11-09 DIAGNOSIS — I2510 Atherosclerotic heart disease of native coronary artery without angina pectoris: Secondary | ICD-10-CM | POA: Diagnosis not present

## 2017-11-10 DIAGNOSIS — J449 Chronic obstructive pulmonary disease, unspecified: Secondary | ICD-10-CM | POA: Diagnosis not present

## 2017-11-10 DIAGNOSIS — I509 Heart failure, unspecified: Secondary | ICD-10-CM | POA: Diagnosis not present

## 2017-11-10 DIAGNOSIS — F329 Major depressive disorder, single episode, unspecified: Secondary | ICD-10-CM | POA: Diagnosis not present

## 2017-11-10 DIAGNOSIS — S069X9S Unspecified intracranial injury with loss of consciousness of unspecified duration, sequela: Secondary | ICD-10-CM | POA: Diagnosis not present

## 2017-11-10 DIAGNOSIS — B028 Zoster with other complications: Secondary | ICD-10-CM | POA: Diagnosis not present

## 2017-11-10 DIAGNOSIS — E119 Type 2 diabetes mellitus without complications: Secondary | ICD-10-CM | POA: Diagnosis not present

## 2017-11-10 DIAGNOSIS — R419 Unspecified symptoms and signs involving cognitive functions and awareness: Secondary | ICD-10-CM | POA: Diagnosis not present

## 2017-11-10 DIAGNOSIS — I2510 Atherosclerotic heart disease of native coronary artery without angina pectoris: Secondary | ICD-10-CM | POA: Diagnosis not present

## 2017-11-10 DIAGNOSIS — M1009 Idiopathic gout, multiple sites: Secondary | ICD-10-CM | POA: Diagnosis not present

## 2017-11-14 DIAGNOSIS — M109 Gout, unspecified: Secondary | ICD-10-CM | POA: Diagnosis not present

## 2017-11-14 DIAGNOSIS — B028 Zoster with other complications: Secondary | ICD-10-CM | POA: Diagnosis not present

## 2017-11-15 DIAGNOSIS — Z885 Allergy status to narcotic agent status: Secondary | ICD-10-CM | POA: Diagnosis not present

## 2017-11-15 DIAGNOSIS — Z950 Presence of cardiac pacemaker: Secondary | ICD-10-CM | POA: Diagnosis not present

## 2017-11-15 DIAGNOSIS — B029 Zoster without complications: Secondary | ICD-10-CM | POA: Diagnosis not present

## 2017-11-15 DIAGNOSIS — Z85038 Personal history of other malignant neoplasm of large intestine: Secondary | ICD-10-CM | POA: Diagnosis not present

## 2017-11-15 DIAGNOSIS — B028 Zoster with other complications: Secondary | ICD-10-CM | POA: Diagnosis not present

## 2017-11-15 DIAGNOSIS — M109 Gout, unspecified: Secondary | ICD-10-CM | POA: Diagnosis not present

## 2017-11-15 DIAGNOSIS — E119 Type 2 diabetes mellitus without complications: Secondary | ICD-10-CM | POA: Diagnosis not present

## 2017-11-21 DIAGNOSIS — Z7984 Long term (current) use of oral hypoglycemic drugs: Secondary | ICD-10-CM | POA: Diagnosis not present

## 2017-11-21 DIAGNOSIS — E119 Type 2 diabetes mellitus without complications: Secondary | ICD-10-CM | POA: Diagnosis not present

## 2017-11-21 DIAGNOSIS — M1380 Other specified arthritis, unspecified site: Secondary | ICD-10-CM | POA: Diagnosis not present

## 2017-11-21 DIAGNOSIS — R6889 Other general symptoms and signs: Secondary | ICD-10-CM | POA: Diagnosis not present

## 2017-11-21 DIAGNOSIS — F17200 Nicotine dependence, unspecified, uncomplicated: Secondary | ICD-10-CM | POA: Diagnosis not present

## 2017-11-21 DIAGNOSIS — Z950 Presence of cardiac pacemaker: Secondary | ICD-10-CM | POA: Diagnosis not present

## 2017-11-21 DIAGNOSIS — K219 Gastro-esophageal reflux disease without esophagitis: Secondary | ICD-10-CM | POA: Diagnosis not present

## 2017-11-21 DIAGNOSIS — Z951 Presence of aortocoronary bypass graft: Secondary | ICD-10-CM | POA: Diagnosis not present

## 2017-11-21 DIAGNOSIS — M109 Gout, unspecified: Secondary | ICD-10-CM | POA: Diagnosis not present

## 2017-11-21 DIAGNOSIS — Z7689 Persons encountering health services in other specified circumstances: Secondary | ICD-10-CM | POA: Diagnosis not present

## 2017-11-21 DIAGNOSIS — R1084 Generalized abdominal pain: Secondary | ICD-10-CM | POA: Diagnosis not present

## 2017-11-21 DIAGNOSIS — I10 Essential (primary) hypertension: Secondary | ICD-10-CM | POA: Diagnosis not present

## 2017-11-21 DIAGNOSIS — M1990 Unspecified osteoarthritis, unspecified site: Secondary | ICD-10-CM | POA: Diagnosis not present

## 2017-11-25 DIAGNOSIS — I2510 Atherosclerotic heart disease of native coronary artery without angina pectoris: Secondary | ICD-10-CM | POA: Diagnosis not present

## 2017-11-25 DIAGNOSIS — K219 Gastro-esophageal reflux disease without esophagitis: Secondary | ICD-10-CM | POA: Diagnosis not present

## 2017-11-25 DIAGNOSIS — I10 Essential (primary) hypertension: Secondary | ICD-10-CM | POA: Diagnosis not present

## 2017-11-25 DIAGNOSIS — E119 Type 2 diabetes mellitus without complications: Secondary | ICD-10-CM | POA: Diagnosis not present

## 2017-11-25 DIAGNOSIS — R198 Other specified symptoms and signs involving the digestive system and abdomen: Secondary | ICD-10-CM | POA: Diagnosis not present

## 2017-11-25 DIAGNOSIS — K5900 Constipation, unspecified: Secondary | ICD-10-CM | POA: Diagnosis not present

## 2017-11-25 DIAGNOSIS — Z7984 Long term (current) use of oral hypoglycemic drugs: Secondary | ICD-10-CM | POA: Diagnosis not present

## 2017-11-25 DIAGNOSIS — M1990 Unspecified osteoarthritis, unspecified site: Secondary | ICD-10-CM | POA: Diagnosis not present

## 2017-11-30 DIAGNOSIS — M109 Gout, unspecified: Secondary | ICD-10-CM | POA: Diagnosis not present

## 2017-11-30 DIAGNOSIS — I82409 Acute embolism and thrombosis of unspecified deep veins of unspecified lower extremity: Secondary | ICD-10-CM | POA: Diagnosis not present

## 2017-11-30 DIAGNOSIS — M25562 Pain in left knee: Secondary | ICD-10-CM | POA: Diagnosis not present

## 2017-11-30 DIAGNOSIS — I509 Heart failure, unspecified: Secondary | ICD-10-CM | POA: Diagnosis not present

## 2017-11-30 DIAGNOSIS — K5641 Fecal impaction: Secondary | ICD-10-CM | POA: Diagnosis not present

## 2017-11-30 DIAGNOSIS — R531 Weakness: Secondary | ICD-10-CM | POA: Diagnosis not present

## 2017-11-30 DIAGNOSIS — R109 Unspecified abdominal pain: Secondary | ICD-10-CM | POA: Diagnosis not present

## 2017-11-30 DIAGNOSIS — I252 Old myocardial infarction: Secondary | ICD-10-CM | POA: Diagnosis not present

## 2017-11-30 DIAGNOSIS — I10 Essential (primary) hypertension: Secondary | ICD-10-CM | POA: Diagnosis not present

## 2017-11-30 DIAGNOSIS — K219 Gastro-esophageal reflux disease without esophagitis: Secondary | ICD-10-CM | POA: Diagnosis not present

## 2017-11-30 DIAGNOSIS — K6289 Other specified diseases of anus and rectum: Secondary | ICD-10-CM | POA: Diagnosis not present

## 2017-11-30 DIAGNOSIS — K5900 Constipation, unspecified: Secondary | ICD-10-CM | POA: Diagnosis not present

## 2017-11-30 DIAGNOSIS — I25811 Atherosclerosis of native coronary artery of transplanted heart without angina pectoris: Secondary | ICD-10-CM | POA: Diagnosis not present

## 2017-11-30 DIAGNOSIS — E1142 Type 2 diabetes mellitus with diabetic polyneuropathy: Secondary | ICD-10-CM | POA: Diagnosis not present

## 2017-11-30 DIAGNOSIS — I110 Hypertensive heart disease with heart failure: Secondary | ICD-10-CM | POA: Diagnosis not present

## 2017-11-30 DIAGNOSIS — M1990 Unspecified osteoarthritis, unspecified site: Secondary | ICD-10-CM | POA: Diagnosis not present

## 2017-12-01 DIAGNOSIS — M1990 Unspecified osteoarthritis, unspecified site: Secondary | ICD-10-CM | POA: Diagnosis not present

## 2017-12-01 DIAGNOSIS — I10 Essential (primary) hypertension: Secondary | ICD-10-CM | POA: Diagnosis not present

## 2017-12-01 DIAGNOSIS — K5900 Constipation, unspecified: Secondary | ICD-10-CM | POA: Diagnosis not present

## 2017-12-01 DIAGNOSIS — I82409 Acute embolism and thrombosis of unspecified deep veins of unspecified lower extremity: Secondary | ICD-10-CM | POA: Diagnosis not present

## 2017-12-08 DIAGNOSIS — R296 Repeated falls: Secondary | ICD-10-CM | POA: Diagnosis not present

## 2017-12-08 DIAGNOSIS — E785 Hyperlipidemia, unspecified: Secondary | ICD-10-CM | POA: Diagnosis not present

## 2017-12-08 DIAGNOSIS — I639 Cerebral infarction, unspecified: Secondary | ICD-10-CM | POA: Diagnosis not present

## 2017-12-08 DIAGNOSIS — I252 Old myocardial infarction: Secondary | ICD-10-CM | POA: Diagnosis not present

## 2017-12-08 DIAGNOSIS — R7989 Other specified abnormal findings of blood chemistry: Secondary | ICD-10-CM | POA: Diagnosis not present

## 2017-12-08 DIAGNOSIS — M1712 Unilateral primary osteoarthritis, left knee: Secondary | ICD-10-CM | POA: Diagnosis not present

## 2017-12-08 DIAGNOSIS — I110 Hypertensive heart disease with heart failure: Secondary | ICD-10-CM | POA: Diagnosis not present

## 2017-12-08 DIAGNOSIS — R51 Headache: Secondary | ICD-10-CM | POA: Diagnosis not present

## 2017-12-08 DIAGNOSIS — M25561 Pain in right knee: Secondary | ICD-10-CM | POA: Diagnosis not present

## 2017-12-08 DIAGNOSIS — I5023 Acute on chronic systolic (congestive) heart failure: Secondary | ICD-10-CM | POA: Diagnosis not present

## 2017-12-08 DIAGNOSIS — J90 Pleural effusion, not elsewhere classified: Secondary | ICD-10-CM | POA: Diagnosis not present

## 2017-12-08 DIAGNOSIS — E119 Type 2 diabetes mellitus without complications: Secondary | ICD-10-CM | POA: Diagnosis not present

## 2017-12-08 DIAGNOSIS — R531 Weakness: Secondary | ICD-10-CM | POA: Diagnosis not present

## 2017-12-08 DIAGNOSIS — I25811 Atherosclerosis of native coronary artery of transplanted heart without angina pectoris: Secondary | ICD-10-CM | POA: Diagnosis not present

## 2017-12-08 DIAGNOSIS — R0602 Shortness of breath: Secondary | ICD-10-CM | POA: Diagnosis not present

## 2017-12-08 DIAGNOSIS — M109 Gout, unspecified: Secondary | ICD-10-CM | POA: Diagnosis not present

## 2017-12-08 DIAGNOSIS — I214 Non-ST elevation (NSTEMI) myocardial infarction: Secondary | ICD-10-CM | POA: Diagnosis not present

## 2017-12-08 DIAGNOSIS — E1142 Type 2 diabetes mellitus with diabetic polyneuropathy: Secondary | ICD-10-CM | POA: Diagnosis not present

## 2017-12-08 DIAGNOSIS — E1151 Type 2 diabetes mellitus with diabetic peripheral angiopathy without gangrene: Secondary | ICD-10-CM | POA: Diagnosis not present

## 2017-12-09 DIAGNOSIS — E119 Type 2 diabetes mellitus without complications: Secondary | ICD-10-CM | POA: Diagnosis not present

## 2017-12-09 DIAGNOSIS — R7989 Other specified abnormal findings of blood chemistry: Secondary | ICD-10-CM | POA: Diagnosis not present

## 2017-12-09 DIAGNOSIS — R296 Repeated falls: Secondary | ICD-10-CM | POA: Diagnosis not present

## 2017-12-09 DIAGNOSIS — R531 Weakness: Secondary | ICD-10-CM | POA: Diagnosis not present

## 2017-12-10 DIAGNOSIS — R7989 Other specified abnormal findings of blood chemistry: Secondary | ICD-10-CM | POA: Diagnosis not present

## 2017-12-10 DIAGNOSIS — M1712 Unilateral primary osteoarthritis, left knee: Secondary | ICD-10-CM | POA: Diagnosis not present

## 2017-12-10 DIAGNOSIS — E119 Type 2 diabetes mellitus without complications: Secondary | ICD-10-CM | POA: Diagnosis not present

## 2017-12-10 DIAGNOSIS — I739 Peripheral vascular disease, unspecified: Secondary | ICD-10-CM | POA: Diagnosis not present

## 2017-12-10 DIAGNOSIS — R072 Precordial pain: Secondary | ICD-10-CM | POA: Diagnosis not present

## 2017-12-10 DIAGNOSIS — I420 Dilated cardiomyopathy: Secondary | ICD-10-CM | POA: Diagnosis not present

## 2017-12-10 DIAGNOSIS — R531 Weakness: Secondary | ICD-10-CM | POA: Diagnosis not present

## 2017-12-10 DIAGNOSIS — I639 Cerebral infarction, unspecified: Secondary | ICD-10-CM | POA: Diagnosis not present

## 2017-12-10 DIAGNOSIS — R296 Repeated falls: Secondary | ICD-10-CM | POA: Diagnosis not present

## 2017-12-11 DIAGNOSIS — E119 Type 2 diabetes mellitus without complications: Secondary | ICD-10-CM | POA: Diagnosis not present

## 2017-12-11 DIAGNOSIS — R7989 Other specified abnormal findings of blood chemistry: Secondary | ICD-10-CM | POA: Diagnosis not present

## 2017-12-11 DIAGNOSIS — I429 Cardiomyopathy, unspecified: Secondary | ICD-10-CM | POA: Diagnosis not present

## 2017-12-11 DIAGNOSIS — R0602 Shortness of breath: Secondary | ICD-10-CM | POA: Diagnosis not present

## 2017-12-11 DIAGNOSIS — I739 Peripheral vascular disease, unspecified: Secondary | ICD-10-CM | POA: Diagnosis not present

## 2017-12-11 DIAGNOSIS — R296 Repeated falls: Secondary | ICD-10-CM | POA: Diagnosis not present

## 2017-12-11 DIAGNOSIS — R531 Weakness: Secondary | ICD-10-CM | POA: Diagnosis not present

## 2017-12-12 DIAGNOSIS — E119 Type 2 diabetes mellitus without complications: Secondary | ICD-10-CM | POA: Diagnosis not present

## 2017-12-12 DIAGNOSIS — I429 Cardiomyopathy, unspecified: Secondary | ICD-10-CM | POA: Diagnosis not present

## 2017-12-12 DIAGNOSIS — R0602 Shortness of breath: Secondary | ICD-10-CM | POA: Diagnosis not present

## 2017-12-12 DIAGNOSIS — R296 Repeated falls: Secondary | ICD-10-CM | POA: Diagnosis not present

## 2017-12-12 DIAGNOSIS — R7989 Other specified abnormal findings of blood chemistry: Secondary | ICD-10-CM | POA: Diagnosis not present

## 2017-12-12 DIAGNOSIS — I739 Peripheral vascular disease, unspecified: Secondary | ICD-10-CM | POA: Diagnosis not present

## 2017-12-12 DIAGNOSIS — R531 Weakness: Secondary | ICD-10-CM | POA: Diagnosis not present

## 2017-12-13 DIAGNOSIS — M79672 Pain in left foot: Secondary | ICD-10-CM | POA: Diagnosis not present

## 2017-12-13 DIAGNOSIS — R7989 Other specified abnormal findings of blood chemistry: Secondary | ICD-10-CM | POA: Diagnosis not present

## 2017-12-13 DIAGNOSIS — E119 Type 2 diabetes mellitus without complications: Secondary | ICD-10-CM | POA: Diagnosis not present

## 2017-12-13 DIAGNOSIS — R531 Weakness: Secondary | ICD-10-CM | POA: Diagnosis not present

## 2017-12-13 DIAGNOSIS — R296 Repeated falls: Secondary | ICD-10-CM | POA: Diagnosis not present

## 2017-12-13 DIAGNOSIS — I739 Peripheral vascular disease, unspecified: Secondary | ICD-10-CM | POA: Diagnosis not present

## 2017-12-13 DIAGNOSIS — M79671 Pain in right foot: Secondary | ICD-10-CM | POA: Diagnosis not present

## 2017-12-14 DIAGNOSIS — R531 Weakness: Secondary | ICD-10-CM | POA: Diagnosis not present

## 2017-12-14 DIAGNOSIS — R296 Repeated falls: Secondary | ICD-10-CM | POA: Diagnosis not present

## 2017-12-14 DIAGNOSIS — R7989 Other specified abnormal findings of blood chemistry: Secondary | ICD-10-CM | POA: Diagnosis not present

## 2017-12-14 DIAGNOSIS — E119 Type 2 diabetes mellitus without complications: Secondary | ICD-10-CM | POA: Diagnosis not present

## 2017-12-15 DIAGNOSIS — R296 Repeated falls: Secondary | ICD-10-CM | POA: Diagnosis not present

## 2017-12-15 DIAGNOSIS — E119 Type 2 diabetes mellitus without complications: Secondary | ICD-10-CM | POA: Diagnosis not present

## 2017-12-15 DIAGNOSIS — R7989 Other specified abnormal findings of blood chemistry: Secondary | ICD-10-CM | POA: Diagnosis not present

## 2017-12-15 DIAGNOSIS — R531 Weakness: Secondary | ICD-10-CM | POA: Diagnosis not present

## 2017-12-16 DIAGNOSIS — R7989 Other specified abnormal findings of blood chemistry: Secondary | ICD-10-CM | POA: Diagnosis not present

## 2017-12-16 DIAGNOSIS — R531 Weakness: Secondary | ICD-10-CM | POA: Diagnosis not present

## 2017-12-16 DIAGNOSIS — R296 Repeated falls: Secondary | ICD-10-CM | POA: Diagnosis not present

## 2017-12-16 DIAGNOSIS — E119 Type 2 diabetes mellitus without complications: Secondary | ICD-10-CM | POA: Diagnosis not present

## 2017-12-17 DIAGNOSIS — R7989 Other specified abnormal findings of blood chemistry: Secondary | ICD-10-CM | POA: Diagnosis not present

## 2017-12-17 DIAGNOSIS — E119 Type 2 diabetes mellitus without complications: Secondary | ICD-10-CM | POA: Diagnosis not present

## 2017-12-17 DIAGNOSIS — R531 Weakness: Secondary | ICD-10-CM | POA: Diagnosis not present

## 2017-12-17 DIAGNOSIS — R296 Repeated falls: Secondary | ICD-10-CM | POA: Diagnosis not present

## 2017-12-18 DIAGNOSIS — R296 Repeated falls: Secondary | ICD-10-CM | POA: Diagnosis not present

## 2017-12-18 DIAGNOSIS — R531 Weakness: Secondary | ICD-10-CM | POA: Diagnosis not present

## 2017-12-18 DIAGNOSIS — R7989 Other specified abnormal findings of blood chemistry: Secondary | ICD-10-CM | POA: Diagnosis not present

## 2017-12-18 DIAGNOSIS — E119 Type 2 diabetes mellitus without complications: Secondary | ICD-10-CM | POA: Diagnosis not present

## 2017-12-19 DIAGNOSIS — E119 Type 2 diabetes mellitus without complications: Secondary | ICD-10-CM | POA: Diagnosis not present

## 2017-12-19 DIAGNOSIS — R531 Weakness: Secondary | ICD-10-CM | POA: Diagnosis not present

## 2017-12-19 DIAGNOSIS — R7989 Other specified abnormal findings of blood chemistry: Secondary | ICD-10-CM | POA: Diagnosis not present

## 2017-12-19 DIAGNOSIS — R296 Repeated falls: Secondary | ICD-10-CM | POA: Diagnosis not present

## 2017-12-20 DIAGNOSIS — R531 Weakness: Secondary | ICD-10-CM | POA: Diagnosis not present

## 2017-12-20 DIAGNOSIS — R296 Repeated falls: Secondary | ICD-10-CM | POA: Diagnosis not present

## 2017-12-20 DIAGNOSIS — E119 Type 2 diabetes mellitus without complications: Secondary | ICD-10-CM | POA: Diagnosis not present

## 2017-12-20 DIAGNOSIS — R7989 Other specified abnormal findings of blood chemistry: Secondary | ICD-10-CM | POA: Diagnosis not present

## 2017-12-21 DIAGNOSIS — R7989 Other specified abnormal findings of blood chemistry: Secondary | ICD-10-CM | POA: Diagnosis not present

## 2017-12-21 DIAGNOSIS — R531 Weakness: Secondary | ICD-10-CM | POA: Diagnosis not present

## 2017-12-21 DIAGNOSIS — E119 Type 2 diabetes mellitus without complications: Secondary | ICD-10-CM | POA: Diagnosis not present

## 2017-12-21 DIAGNOSIS — Z046 Encounter for general psychiatric examination, requested by authority: Secondary | ICD-10-CM | POA: Diagnosis not present

## 2017-12-21 DIAGNOSIS — I639 Cerebral infarction, unspecified: Secondary | ICD-10-CM | POA: Diagnosis not present

## 2017-12-21 DIAGNOSIS — R296 Repeated falls: Secondary | ICD-10-CM | POA: Diagnosis not present

## 2017-12-23 DIAGNOSIS — I110 Hypertensive heart disease with heart failure: Secondary | ICD-10-CM | POA: Diagnosis not present

## 2017-12-23 DIAGNOSIS — E1151 Type 2 diabetes mellitus with diabetic peripheral angiopathy without gangrene: Secondary | ICD-10-CM | POA: Diagnosis not present

## 2017-12-23 DIAGNOSIS — R32 Unspecified urinary incontinence: Secondary | ICD-10-CM | POA: Diagnosis not present

## 2017-12-23 DIAGNOSIS — M109 Gout, unspecified: Secondary | ICD-10-CM | POA: Diagnosis not present

## 2017-12-23 DIAGNOSIS — I872 Venous insufficiency (chronic) (peripheral): Secondary | ICD-10-CM | POA: Diagnosis not present

## 2017-12-23 DIAGNOSIS — I429 Cardiomyopathy, unspecified: Secondary | ICD-10-CM | POA: Diagnosis not present

## 2017-12-23 DIAGNOSIS — I2510 Atherosclerotic heart disease of native coronary artery without angina pectoris: Secondary | ICD-10-CM | POA: Diagnosis not present

## 2017-12-23 DIAGNOSIS — M1712 Unilateral primary osteoarthritis, left knee: Secondary | ICD-10-CM | POA: Diagnosis not present

## 2017-12-23 DIAGNOSIS — I5022 Chronic systolic (congestive) heart failure: Secondary | ICD-10-CM | POA: Diagnosis not present

## 2017-12-24 DIAGNOSIS — E119 Type 2 diabetes mellitus without complications: Secondary | ICD-10-CM | POA: Diagnosis not present

## 2017-12-24 DIAGNOSIS — R079 Chest pain, unspecified: Secondary | ICD-10-CM | POA: Diagnosis not present

## 2017-12-24 DIAGNOSIS — Z85038 Personal history of other malignant neoplasm of large intestine: Secondary | ICD-10-CM | POA: Diagnosis not present

## 2017-12-24 DIAGNOSIS — Z941 Heart transplant status: Secondary | ICD-10-CM | POA: Diagnosis not present

## 2017-12-24 DIAGNOSIS — I252 Old myocardial infarction: Secondary | ICD-10-CM | POA: Diagnosis not present

## 2017-12-24 DIAGNOSIS — K219 Gastro-esophageal reflux disease without esophagitis: Secondary | ICD-10-CM | POA: Diagnosis not present

## 2017-12-24 DIAGNOSIS — R1111 Vomiting without nausea: Secondary | ICD-10-CM | POA: Diagnosis not present

## 2017-12-24 DIAGNOSIS — M1990 Unspecified osteoarthritis, unspecified site: Secondary | ICD-10-CM | POA: Diagnosis not present

## 2017-12-24 DIAGNOSIS — Z95810 Presence of automatic (implantable) cardiac defibrillator: Secondary | ICD-10-CM | POA: Diagnosis not present

## 2017-12-24 DIAGNOSIS — I5020 Unspecified systolic (congestive) heart failure: Secondary | ICD-10-CM | POA: Diagnosis not present

## 2017-12-24 DIAGNOSIS — Z86718 Personal history of other venous thrombosis and embolism: Secondary | ICD-10-CM | POA: Diagnosis not present

## 2017-12-24 DIAGNOSIS — R52 Pain, unspecified: Secondary | ICD-10-CM | POA: Diagnosis not present

## 2017-12-24 DIAGNOSIS — R1084 Generalized abdominal pain: Secondary | ICD-10-CM | POA: Diagnosis not present

## 2017-12-26 DIAGNOSIS — M1712 Unilateral primary osteoarthritis, left knee: Secondary | ICD-10-CM | POA: Diagnosis not present

## 2017-12-26 DIAGNOSIS — I872 Venous insufficiency (chronic) (peripheral): Secondary | ICD-10-CM | POA: Diagnosis not present

## 2017-12-26 DIAGNOSIS — I110 Hypertensive heart disease with heart failure: Secondary | ICD-10-CM | POA: Diagnosis not present

## 2017-12-26 DIAGNOSIS — M109 Gout, unspecified: Secondary | ICD-10-CM | POA: Diagnosis not present

## 2017-12-26 DIAGNOSIS — I429 Cardiomyopathy, unspecified: Secondary | ICD-10-CM | POA: Diagnosis not present

## 2017-12-26 DIAGNOSIS — I5022 Chronic systolic (congestive) heart failure: Secondary | ICD-10-CM | POA: Diagnosis not present

## 2017-12-26 DIAGNOSIS — I2510 Atherosclerotic heart disease of native coronary artery without angina pectoris: Secondary | ICD-10-CM | POA: Diagnosis not present

## 2017-12-26 DIAGNOSIS — E1151 Type 2 diabetes mellitus with diabetic peripheral angiopathy without gangrene: Secondary | ICD-10-CM | POA: Diagnosis not present

## 2017-12-26 DIAGNOSIS — R32 Unspecified urinary incontinence: Secondary | ICD-10-CM | POA: Diagnosis not present

## 2017-12-27 ENCOUNTER — Inpatient Hospital Stay
Admit: 2017-12-27 | Discharge: 2017-12-27 | Disposition: A | Discharge status: Home Health | Payer: MEDICARE | Attending: Emergency Medicine

## 2017-12-27 ENCOUNTER — Emergency Department: Admit: 2017-12-27 | Payer: MEDICARE | Primary: Adult Health

## 2017-12-27 DIAGNOSIS — Z7689 Persons encountering health services in other specified circumstances: Secondary | ICD-10-CM | POA: Diagnosis not present

## 2017-12-27 DIAGNOSIS — Z950 Presence of cardiac pacemaker: Secondary | ICD-10-CM | POA: Diagnosis not present

## 2017-12-27 DIAGNOSIS — M109 Gout, unspecified: Secondary | ICD-10-CM | POA: Diagnosis not present

## 2017-12-27 DIAGNOSIS — R0902 Hypoxemia: Secondary | ICD-10-CM | POA: Diagnosis not present

## 2017-12-27 DIAGNOSIS — M25512 Pain in left shoulder: Secondary | ICD-10-CM | POA: Diagnosis not present

## 2017-12-27 DIAGNOSIS — S46811A Strain of other muscles, fascia and tendons at shoulder and upper arm level, right arm, initial encounter: Secondary | ICD-10-CM | POA: Diagnosis not present

## 2017-12-27 DIAGNOSIS — R202 Paresthesia of skin: Secondary | ICD-10-CM | POA: Diagnosis not present

## 2017-12-27 DIAGNOSIS — X58XXXA Exposure to other specified factors, initial encounter: Secondary | ICD-10-CM | POA: Diagnosis not present

## 2017-12-27 DIAGNOSIS — E1140 Type 2 diabetes mellitus with diabetic neuropathy, unspecified: Secondary | ICD-10-CM | POA: Diagnosis not present

## 2017-12-27 DIAGNOSIS — G8929 Other chronic pain: Secondary | ICD-10-CM | POA: Diagnosis not present

## 2017-12-27 DIAGNOSIS — Z79899 Other long term (current) drug therapy: Secondary | ICD-10-CM | POA: Diagnosis not present

## 2017-12-27 LAB — METABOLIC PANEL, COMPREHENSIVE
A-G Ratio: 0.8 — ABNORMAL LOW (ref 1.1–2.2)
ALT (SGPT): 30 U/L (ref 12–78)
AST (SGOT): 33 U/L (ref 15–37)
Albumin: 3.5 g/dL (ref 3.5–5.0)
Alk. phosphatase: 92 U/L (ref 45–117)
Anion gap: 10 mmol/L (ref 5–15)
BUN/Creatinine ratio: 14 (ref 12–20)
BUN: 19 MG/DL (ref 6–20)
Bilirubin, total: 0.7 MG/DL (ref 0.2–1.0)
CO2: 21 mmol/L (ref 21–32)
Calcium: 9.8 MG/DL (ref 8.5–10.1)
Chloride: 109 mmol/L — ABNORMAL HIGH (ref 97–108)
Creatinine: 1.34 MG/DL — ABNORMAL HIGH (ref 0.70–1.30)
GFR est AA: >60 mL/min/{1.73_m2} (ref >60)
GFR est non-AA: 51 mL/min/{1.73_m2} — ABNORMAL LOW (ref >60)
Globulin: 4.2 g/dL — ABNORMAL HIGH (ref 2.0–4.0)
Glucose: 80 mg/dL (ref 65–100)
Potassium: 4.8 mmol/L (ref 3.5–5.1)
Protein, total: 7.7 g/dL (ref 6.4–8.2)
Sodium: 140 mmol/L (ref 136–145)

## 2017-12-27 LAB — TROPONIN I: Troponin-I, Qt.: <0.05 ng/mL (ref <0.05)

## 2017-12-27 LAB — CBC WITH AUTOMATED DIFF
ABS. BASOPHILS: 0 10*3/uL (ref 0.0–0.1)
ABS. EOSINOPHILS: 0.5 10*3/uL — ABNORMAL HIGH (ref 0.0–0.4)
ABS. IMM. GRANS.: 0 10*3/uL (ref 0.00–0.04)
ABS. LYMPHOCYTES: 1.5 10*3/uL (ref 0.8–3.5)
ABS. MONOCYTES: 0.7 10*3/uL (ref 0.0–1.0)
ABS. NEUTROPHILS: 3.2 10*3/uL (ref 1.8–8.0)
ABSOLUTE NRBC: 0 10*3/uL (ref 0.00–0.01)
BASOPHILS: 1 % (ref 0–1)
EOSINOPHILS: 8 % — ABNORMAL HIGH (ref 0–7)
HCT: 42.8 % (ref 36.6–50.3)
HGB: 14.5 g/dL (ref 12.1–17.0)
IMMATURE GRANULOCYTES: 0 % (ref 0.0–0.5)
LYMPHOCYTES: 26 % (ref 12–49)
MCH: 30.9 PG (ref 26.0–34.0)
MCHC: 33.9 g/dL (ref 30.0–36.5)
MCV: 91.3 FL (ref 80.0–99.0)
MONOCYTES: 12 % (ref 5–13)
MPV: 10.9 FL (ref 8.9–12.9)
NEUTROPHILS: 53 % (ref 32–75)
NRBC: 0 PER 100 WBC
PLATELET: 178 10*3/uL (ref 150–400)
RBC: 4.69 M/uL (ref 4.10–5.70)
RDW: 13.3 % (ref 11.5–14.5)
WBC: 6 10*3/uL (ref 4.1–11.1)

## 2017-12-27 LAB — CBC WITH AUTO DIFFERENTIAL
Basophils %: 1 % (ref 0–1)
Basophils Absolute: 0 10*3/uL (ref 0.0–0.1)
Eosinophils %: 8 % — ABNORMAL HIGH (ref 0–7)
Eosinophils Absolute: 0.5 10*3/uL — ABNORMAL HIGH (ref 0.0–0.4)
Granulocyte Absolute Count: 0 10*3/uL (ref 0.00–0.04)
Hematocrit: 42.8 % (ref 36.6–50.3)
Hemoglobin: 14.5 g/dL (ref 12.1–17.0)
Immature Granulocytes: 0 % (ref 0.0–0.5)
Lymphocytes %: 26 % (ref 12–49)
Lymphocytes Absolute: 1.5 10*3/uL (ref 0.8–3.5)
MCH: 30.9 PG (ref 26.0–34.0)
MCHC: 33.9 g/dL (ref 30.0–36.5)
MCV: 91.3 FL (ref 80.0–99.0)
MPV: 10.9 FL (ref 8.9–12.9)
Monocytes %: 12 % (ref 5–13)
Monocytes Absolute: 0.7 10*3/uL (ref 0.0–1.0)
NRBC Absolute: 0 10*3/uL (ref 0.00–0.01)
Neutrophils %: 53 % (ref 32–75)
Neutrophils Absolute: 3.2 10*3/uL (ref 1.8–8.0)
Nucleated RBCs: 0 PER 100 WBC
Platelets: 178 10*3/uL (ref 150–400)
RBC: 4.69 M/uL (ref 4.10–5.70)
RDW: 13.3 % (ref 11.5–14.5)
WBC: 6 10*3/uL (ref 4.1–11.1)

## 2017-12-27 LAB — COMPREHENSIVE METABOLIC PANEL
ALT: 30 U/L (ref 12–78)
AST: 33 U/L (ref 15–37)
Albumin/Globulin Ratio: 0.8 — ABNORMAL LOW (ref 1.1–2.2)
Albumin: 3.5 g/dL (ref 3.5–5.0)
Alkaline Phosphatase: 92 U/L (ref 45–117)
Anion Gap: 10 mmol/L (ref 5–15)
BUN: 19 MG/DL (ref 6–20)
Bun/Cre Ratio: 14 (ref 12–20)
CO2: 21 mmol/L (ref 21–32)
Calcium: 9.8 MG/DL (ref 8.5–10.1)
Chloride: 109 mmol/L — ABNORMAL HIGH (ref 97–108)
Creatinine: 1.34 MG/DL — ABNORMAL HIGH (ref 0.70–1.30)
EGFR IF NonAfrican American: 51 mL/min/{1.73_m2} — ABNORMAL LOW (ref >60)
GFR African American: >60 mL/min/{1.73_m2} (ref >60)
Globulin: 4.2 g/dL — ABNORMAL HIGH (ref 2.0–4.0)
Glucose: 80 mg/dL (ref 65–100)
Potassium: 4.8 mmol/L (ref 3.5–5.1)
Sodium: 140 mmol/L (ref 136–145)
Total Bilirubin: 0.7 MG/DL (ref 0.2–1.0)
Total Protein: 7.7 g/dL (ref 6.4–8.2)

## 2017-12-27 LAB — TROPONIN: Troponin I: <0.05 ng/mL (ref <0.05)

## 2017-12-27 MED ORDER — KETOROLAC TROMETHAMINE 30 MG/ML INJECTION
30 mg/mL (1 mL) | Freq: Once | INTRAMUSCULAR | Status: AC
Start: 2017-12-27 — End: 2017-12-27
  Administered 2017-12-27: 18:00:00 via INTRAVENOUS

## 2017-12-27 MED ORDER — NAPROXEN 500 MG TAB
500 mg | ORAL_TABLET | Freq: Two times a day (BID) | ORAL | 0 refills | Status: AC
Start: 2017-12-27 — End: 2018-01-06

## 2017-12-27 MED ORDER — MORPHINE 2 MG/ML INJECTION
2 mg/mL | INTRAMUSCULAR | Status: AC
Start: 2017-12-27 — End: 2017-12-27
  Administered 2017-12-27: 17:00:00 via INTRAVENOUS

## 2017-12-27 MED FILL — MORPHINE 2 MG/ML INJECTION: 2 mg/mL | INTRAMUSCULAR | Qty: 1

## 2017-12-27 MED FILL — KETOROLAC TROMETHAMINE 30 MG/ML INJECTION: 30 mg/mL (1 mL) | INTRAMUSCULAR | Qty: 1
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
## 2017-12-27 NOTE — ED Notes (Signed)
[facility] transport called, eta within the hour.

## 2017-12-27 NOTE — ED Notes (Signed)
[facility] here to transport pt

## 2017-12-27 NOTE — ED Triage Notes (Signed)
Patient arrives to the emergency department via EMS with a chief complaint of L shoulder pain and bilateral foot tingling in pain. L shoulder pain pt reports that has been ongoing but today he called his home health nurse who told him to come the ED to have it evaluated. Pt also reports bilateral foot pain and tingling that has been ongoing for more than 2 years.
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
## 2017-12-27 NOTE — ED Notes (Signed)
[facility] transport called, eta within the hour.

## 2017-12-27 NOTE — ED Notes (Signed)
Patient arrives to the emergency department via EMS with a chief complaint of L shoulder pain and bilateral foot tingling in pain. L shoulder pain pt reports that has been ongoing but today he called his home health nurse who told him to come the ED to have it evaluated. Pt also reports bilateral foot pain and tingling that has been ongoing for more than 2 years.

## 2017-12-28 DIAGNOSIS — I2510 Atherosclerotic heart disease of native coronary artery without angina pectoris: Secondary | ICD-10-CM | POA: Diagnosis not present

## 2017-12-28 DIAGNOSIS — I5022 Chronic systolic (congestive) heart failure: Secondary | ICD-10-CM | POA: Diagnosis not present

## 2017-12-28 DIAGNOSIS — E1151 Type 2 diabetes mellitus with diabetic peripheral angiopathy without gangrene: Secondary | ICD-10-CM | POA: Diagnosis not present

## 2017-12-28 DIAGNOSIS — R32 Unspecified urinary incontinence: Secondary | ICD-10-CM | POA: Diagnosis not present

## 2017-12-28 DIAGNOSIS — I110 Hypertensive heart disease with heart failure: Secondary | ICD-10-CM | POA: Diagnosis not present

## 2017-12-28 DIAGNOSIS — M1712 Unilateral primary osteoarthritis, left knee: Secondary | ICD-10-CM | POA: Diagnosis not present

## 2017-12-28 DIAGNOSIS — M109 Gout, unspecified: Secondary | ICD-10-CM | POA: Diagnosis not present

## 2017-12-28 DIAGNOSIS — I429 Cardiomyopathy, unspecified: Secondary | ICD-10-CM | POA: Diagnosis not present

## 2017-12-28 DIAGNOSIS — I872 Venous insufficiency (chronic) (peripheral): Secondary | ICD-10-CM | POA: Diagnosis not present

## 2017-12-28 LAB — EKG, 12 LEAD, INITIAL
Atrial Rate: 62 {beats}/min
Calculated P Axis: 46 degrees
Calculated R Axis: 79 degrees
Calculated T Axis: 8 degrees
P-R Interval: 136 ms
Q-T Interval: 444 ms
QRS Duration: 122 ms
QTC Calculation (Bezet): 450 ms
Ventricular Rate: 62 {beats}/min

## 2017-12-28 LAB — EKG 12-LEAD
Atrial Rate: 62 {beats}/min
P Axis: 46 degrees
P-R Interval: 136 ms
Q-T Interval: 444 ms
QRS Duration: 122 ms
QTc Calculation (Bazett): 450 ms
R Axis: 79 degrees
T Axis: 8 degrees
Ventricular Rate: 62 {beats}/min

## 2018-01-02 DIAGNOSIS — I2510 Atherosclerotic heart disease of native coronary artery without angina pectoris: Secondary | ICD-10-CM | POA: Diagnosis not present

## 2018-01-02 DIAGNOSIS — I110 Hypertensive heart disease with heart failure: Secondary | ICD-10-CM | POA: Diagnosis not present

## 2018-01-02 DIAGNOSIS — I5022 Chronic systolic (congestive) heart failure: Secondary | ICD-10-CM | POA: Diagnosis not present

## 2018-01-02 DIAGNOSIS — I429 Cardiomyopathy, unspecified: Secondary | ICD-10-CM | POA: Diagnosis not present

## 2018-01-02 DIAGNOSIS — M1712 Unilateral primary osteoarthritis, left knee: Secondary | ICD-10-CM | POA: Diagnosis not present

## 2018-01-02 DIAGNOSIS — M109 Gout, unspecified: Secondary | ICD-10-CM | POA: Diagnosis not present

## 2018-01-02 DIAGNOSIS — I872 Venous insufficiency (chronic) (peripheral): Secondary | ICD-10-CM | POA: Diagnosis not present

## 2018-01-02 DIAGNOSIS — R32 Unspecified urinary incontinence: Secondary | ICD-10-CM | POA: Diagnosis not present

## 2018-01-02 DIAGNOSIS — E1151 Type 2 diabetes mellitus with diabetic peripheral angiopathy without gangrene: Secondary | ICD-10-CM | POA: Diagnosis not present

## 2018-01-03 DIAGNOSIS — M109 Gout, unspecified: Secondary | ICD-10-CM | POA: Diagnosis not present

## 2018-01-03 DIAGNOSIS — I429 Cardiomyopathy, unspecified: Secondary | ICD-10-CM | POA: Diagnosis not present

## 2018-01-03 DIAGNOSIS — E1151 Type 2 diabetes mellitus with diabetic peripheral angiopathy without gangrene: Secondary | ICD-10-CM | POA: Diagnosis not present

## 2018-01-03 DIAGNOSIS — I872 Venous insufficiency (chronic) (peripheral): Secondary | ICD-10-CM | POA: Diagnosis not present

## 2018-01-03 DIAGNOSIS — I2510 Atherosclerotic heart disease of native coronary artery without angina pectoris: Secondary | ICD-10-CM | POA: Diagnosis not present

## 2018-01-03 DIAGNOSIS — I110 Hypertensive heart disease with heart failure: Secondary | ICD-10-CM | POA: Diagnosis not present

## 2018-01-03 DIAGNOSIS — R32 Unspecified urinary incontinence: Secondary | ICD-10-CM | POA: Diagnosis not present

## 2018-01-03 DIAGNOSIS — I5022 Chronic systolic (congestive) heart failure: Secondary | ICD-10-CM | POA: Diagnosis not present

## 2018-01-03 DIAGNOSIS — M1712 Unilateral primary osteoarthritis, left knee: Secondary | ICD-10-CM | POA: Diagnosis not present

## 2018-01-04 DIAGNOSIS — Z09 Encounter for follow-up examination after completed treatment for conditions other than malignant neoplasm: Secondary | ICD-10-CM | POA: Diagnosis not present

## 2018-01-04 DIAGNOSIS — I110 Hypertensive heart disease with heart failure: Secondary | ICD-10-CM | POA: Diagnosis not present

## 2018-01-04 DIAGNOSIS — E1151 Type 2 diabetes mellitus with diabetic peripheral angiopathy without gangrene: Secondary | ICD-10-CM | POA: Diagnosis not present

## 2018-01-04 DIAGNOSIS — I5023 Acute on chronic systolic (congestive) heart failure: Secondary | ICD-10-CM | POA: Diagnosis not present

## 2018-01-04 DIAGNOSIS — I872 Venous insufficiency (chronic) (peripheral): Secondary | ICD-10-CM | POA: Diagnosis not present

## 2018-01-04 DIAGNOSIS — I2510 Atherosclerotic heart disease of native coronary artery without angina pectoris: Secondary | ICD-10-CM | POA: Diagnosis not present

## 2018-01-04 DIAGNOSIS — I429 Cardiomyopathy, unspecified: Secondary | ICD-10-CM | POA: Diagnosis not present

## 2018-01-04 DIAGNOSIS — I10 Essential (primary) hypertension: Secondary | ICD-10-CM | POA: Diagnosis not present

## 2018-01-04 DIAGNOSIS — R32 Unspecified urinary incontinence: Secondary | ICD-10-CM | POA: Diagnosis not present

## 2018-01-04 DIAGNOSIS — K219 Gastro-esophageal reflux disease without esophagitis: Secondary | ICD-10-CM | POA: Diagnosis not present

## 2018-01-04 DIAGNOSIS — I5022 Chronic systolic (congestive) heart failure: Secondary | ICD-10-CM | POA: Diagnosis not present

## 2018-01-04 DIAGNOSIS — Z0289 Encounter for other administrative examinations: Secondary | ICD-10-CM | POA: Diagnosis not present

## 2018-01-04 DIAGNOSIS — Z86718 Personal history of other venous thrombosis and embolism: Secondary | ICD-10-CM | POA: Diagnosis not present

## 2018-01-04 DIAGNOSIS — M1712 Unilateral primary osteoarthritis, left knee: Secondary | ICD-10-CM | POA: Diagnosis not present

## 2018-01-04 DIAGNOSIS — I739 Peripheral vascular disease, unspecified: Secondary | ICD-10-CM | POA: Diagnosis not present

## 2018-01-04 DIAGNOSIS — G629 Polyneuropathy, unspecified: Secondary | ICD-10-CM | POA: Diagnosis not present

## 2018-01-04 DIAGNOSIS — M109 Gout, unspecified: Secondary | ICD-10-CM | POA: Diagnosis not present

## 2018-01-04 DIAGNOSIS — E785 Hyperlipidemia, unspecified: Secondary | ICD-10-CM | POA: Diagnosis not present

## 2018-01-06 DIAGNOSIS — E1151 Type 2 diabetes mellitus with diabetic peripheral angiopathy without gangrene: Secondary | ICD-10-CM | POA: Diagnosis not present

## 2018-01-06 DIAGNOSIS — I872 Venous insufficiency (chronic) (peripheral): Secondary | ICD-10-CM | POA: Diagnosis not present

## 2018-01-06 DIAGNOSIS — R32 Unspecified urinary incontinence: Secondary | ICD-10-CM | POA: Diagnosis not present

## 2018-01-06 DIAGNOSIS — M1712 Unilateral primary osteoarthritis, left knee: Secondary | ICD-10-CM | POA: Diagnosis not present

## 2018-01-06 DIAGNOSIS — I5022 Chronic systolic (congestive) heart failure: Secondary | ICD-10-CM | POA: Diagnosis not present

## 2018-01-06 DIAGNOSIS — I429 Cardiomyopathy, unspecified: Secondary | ICD-10-CM | POA: Diagnosis not present

## 2018-01-06 DIAGNOSIS — M109 Gout, unspecified: Secondary | ICD-10-CM | POA: Diagnosis not present

## 2018-01-06 DIAGNOSIS — I2510 Atherosclerotic heart disease of native coronary artery without angina pectoris: Secondary | ICD-10-CM | POA: Diagnosis not present

## 2018-01-06 DIAGNOSIS — I110 Hypertensive heart disease with heart failure: Secondary | ICD-10-CM | POA: Diagnosis not present

## 2018-01-09 DIAGNOSIS — I872 Venous insufficiency (chronic) (peripheral): Secondary | ICD-10-CM | POA: Diagnosis not present

## 2018-01-09 DIAGNOSIS — I2510 Atherosclerotic heart disease of native coronary artery without angina pectoris: Secondary | ICD-10-CM | POA: Diagnosis not present

## 2018-01-09 DIAGNOSIS — I5022 Chronic systolic (congestive) heart failure: Secondary | ICD-10-CM | POA: Diagnosis not present

## 2018-01-09 DIAGNOSIS — I110 Hypertensive heart disease with heart failure: Secondary | ICD-10-CM | POA: Diagnosis not present

## 2018-01-09 DIAGNOSIS — M109 Gout, unspecified: Secondary | ICD-10-CM | POA: Diagnosis not present

## 2018-01-09 DIAGNOSIS — I429 Cardiomyopathy, unspecified: Secondary | ICD-10-CM | POA: Diagnosis not present

## 2018-01-09 DIAGNOSIS — E1151 Type 2 diabetes mellitus with diabetic peripheral angiopathy without gangrene: Secondary | ICD-10-CM | POA: Diagnosis not present

## 2018-01-09 DIAGNOSIS — M1712 Unilateral primary osteoarthritis, left knee: Secondary | ICD-10-CM | POA: Diagnosis not present

## 2018-01-09 DIAGNOSIS — R32 Unspecified urinary incontinence: Secondary | ICD-10-CM | POA: Diagnosis not present

## 2018-01-10 DIAGNOSIS — I2510 Atherosclerotic heart disease of native coronary artery without angina pectoris: Secondary | ICD-10-CM | POA: Diagnosis not present

## 2018-01-10 DIAGNOSIS — I110 Hypertensive heart disease with heart failure: Secondary | ICD-10-CM | POA: Diagnosis not present

## 2018-01-10 DIAGNOSIS — I429 Cardiomyopathy, unspecified: Secondary | ICD-10-CM | POA: Diagnosis not present

## 2018-01-10 DIAGNOSIS — I872 Venous insufficiency (chronic) (peripheral): Secondary | ICD-10-CM | POA: Diagnosis not present

## 2018-01-10 DIAGNOSIS — R32 Unspecified urinary incontinence: Secondary | ICD-10-CM | POA: Diagnosis not present

## 2018-01-10 DIAGNOSIS — M109 Gout, unspecified: Secondary | ICD-10-CM | POA: Diagnosis not present

## 2018-01-10 DIAGNOSIS — I5022 Chronic systolic (congestive) heart failure: Secondary | ICD-10-CM | POA: Diagnosis not present

## 2018-01-10 DIAGNOSIS — M1712 Unilateral primary osteoarthritis, left knee: Secondary | ICD-10-CM | POA: Diagnosis not present

## 2018-01-10 DIAGNOSIS — E1151 Type 2 diabetes mellitus with diabetic peripheral angiopathy without gangrene: Secondary | ICD-10-CM | POA: Diagnosis not present

## 2018-01-18 DIAGNOSIS — M1712 Unilateral primary osteoarthritis, left knee: Secondary | ICD-10-CM | POA: Diagnosis not present

## 2018-01-18 DIAGNOSIS — I872 Venous insufficiency (chronic) (peripheral): Secondary | ICD-10-CM | POA: Diagnosis not present

## 2018-01-18 DIAGNOSIS — M109 Gout, unspecified: Secondary | ICD-10-CM | POA: Diagnosis not present

## 2018-01-18 DIAGNOSIS — I110 Hypertensive heart disease with heart failure: Secondary | ICD-10-CM | POA: Diagnosis not present

## 2018-01-18 DIAGNOSIS — I2510 Atherosclerotic heart disease of native coronary artery without angina pectoris: Secondary | ICD-10-CM | POA: Diagnosis not present

## 2018-01-18 DIAGNOSIS — R32 Unspecified urinary incontinence: Secondary | ICD-10-CM | POA: Diagnosis not present

## 2018-01-18 DIAGNOSIS — E1151 Type 2 diabetes mellitus with diabetic peripheral angiopathy without gangrene: Secondary | ICD-10-CM | POA: Diagnosis not present

## 2018-01-18 DIAGNOSIS — I429 Cardiomyopathy, unspecified: Secondary | ICD-10-CM | POA: Diagnosis not present

## 2018-01-18 DIAGNOSIS — I5022 Chronic systolic (congestive) heart failure: Secondary | ICD-10-CM | POA: Diagnosis not present

## 2018-01-20 DIAGNOSIS — L97522 Non-pressure chronic ulcer of other part of left foot with fat layer exposed: Secondary | ICD-10-CM | POA: Diagnosis not present

## 2018-01-20 DIAGNOSIS — M2041 Other hammer toe(s) (acquired), right foot: Secondary | ICD-10-CM | POA: Diagnosis not present

## 2018-01-20 DIAGNOSIS — M2042 Other hammer toe(s) (acquired), left foot: Secondary | ICD-10-CM | POA: Diagnosis not present

## 2018-01-20 DIAGNOSIS — E1141 Type 2 diabetes mellitus with diabetic mononeuropathy: Secondary | ICD-10-CM | POA: Diagnosis not present

## 2018-01-20 DIAGNOSIS — B351 Tinea unguium: Secondary | ICD-10-CM | POA: Diagnosis not present

## 2018-01-20 DIAGNOSIS — L97521 Non-pressure chronic ulcer of other part of left foot limited to breakdown of skin: Secondary | ICD-10-CM | POA: Diagnosis not present

## 2018-01-25 DIAGNOSIS — L97521 Non-pressure chronic ulcer of other part of left foot limited to breakdown of skin: Secondary | ICD-10-CM | POA: Diagnosis not present

## 2018-01-25 DIAGNOSIS — I739 Peripheral vascular disease, unspecified: Secondary | ICD-10-CM | POA: Diagnosis not present

## 2018-01-25 DIAGNOSIS — E785 Hyperlipidemia, unspecified: Secondary | ICD-10-CM | POA: Diagnosis not present

## 2018-01-25 DIAGNOSIS — G629 Polyneuropathy, unspecified: Secondary | ICD-10-CM | POA: Diagnosis not present

## 2018-01-25 DIAGNOSIS — L97511 Non-pressure chronic ulcer of other part of right foot limited to breakdown of skin: Secondary | ICD-10-CM | POA: Diagnosis not present

## 2018-01-25 DIAGNOSIS — E11610 Type 2 diabetes mellitus with diabetic neuropathic arthropathy: Secondary | ICD-10-CM | POA: Diagnosis not present

## 2018-01-25 DIAGNOSIS — I10 Essential (primary) hypertension: Secondary | ICD-10-CM | POA: Diagnosis not present

## 2018-01-26 DIAGNOSIS — I872 Venous insufficiency (chronic) (peripheral): Secondary | ICD-10-CM | POA: Diagnosis not present

## 2018-01-26 DIAGNOSIS — I110 Hypertensive heart disease with heart failure: Secondary | ICD-10-CM | POA: Diagnosis not present

## 2018-01-26 DIAGNOSIS — I5022 Chronic systolic (congestive) heart failure: Secondary | ICD-10-CM | POA: Diagnosis not present

## 2018-01-26 DIAGNOSIS — M109 Gout, unspecified: Secondary | ICD-10-CM | POA: Diagnosis not present

## 2018-01-26 DIAGNOSIS — I2510 Atherosclerotic heart disease of native coronary artery without angina pectoris: Secondary | ICD-10-CM | POA: Diagnosis not present

## 2018-01-26 DIAGNOSIS — M1712 Unilateral primary osteoarthritis, left knee: Secondary | ICD-10-CM | POA: Diagnosis not present

## 2018-01-26 DIAGNOSIS — E1151 Type 2 diabetes mellitus with diabetic peripheral angiopathy without gangrene: Secondary | ICD-10-CM | POA: Diagnosis not present

## 2018-01-26 DIAGNOSIS — R32 Unspecified urinary incontinence: Secondary | ICD-10-CM | POA: Diagnosis not present

## 2018-01-26 DIAGNOSIS — I429 Cardiomyopathy, unspecified: Secondary | ICD-10-CM | POA: Diagnosis not present

## 2018-01-26 IMAGING — US US EXTREM LOW VENOUS BILAT
1 series · 13 of 24 positions shown · non-contrast
Comparison: Ultrasound dated 08/04/15

CLINICAL DATA: 81-year-old man with bilateral lower extremity
swelling and redness



[Series 1: us extrem low venous bilat · 0.08mm/px · 13 of 61 slices shown]
[im 1/61]
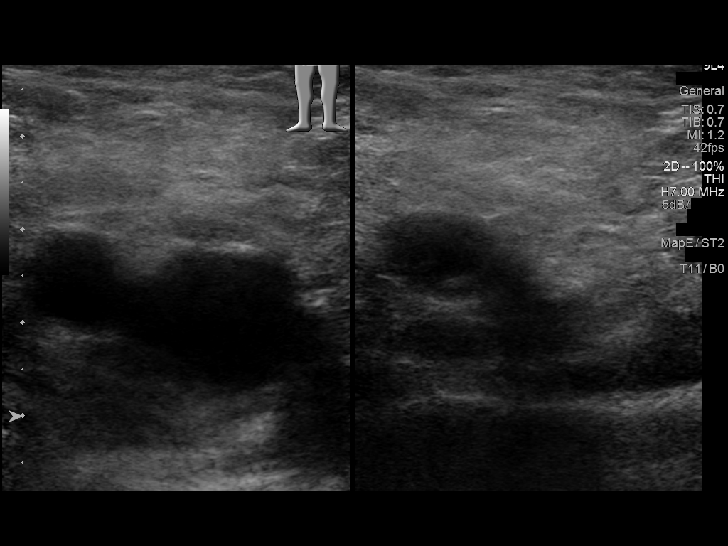
[im 6/61]
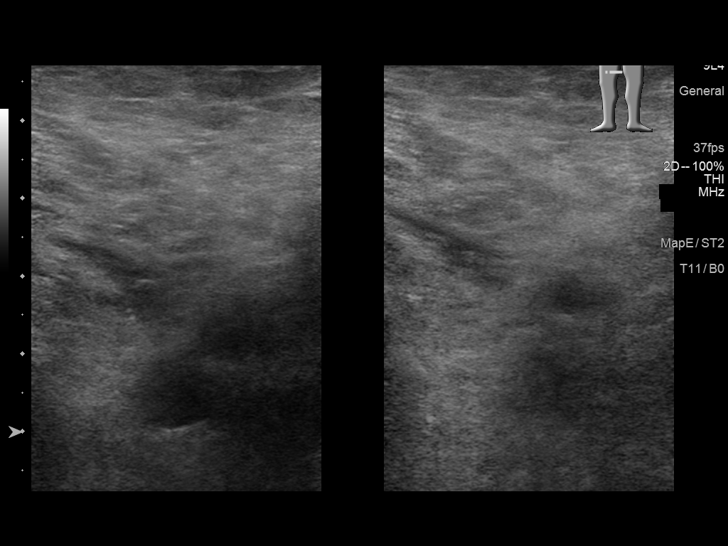
[im 11/61]
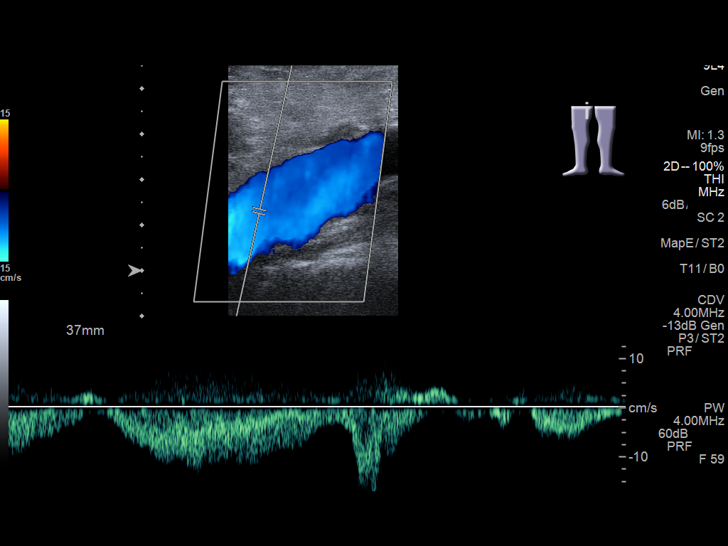
[im 16/61]
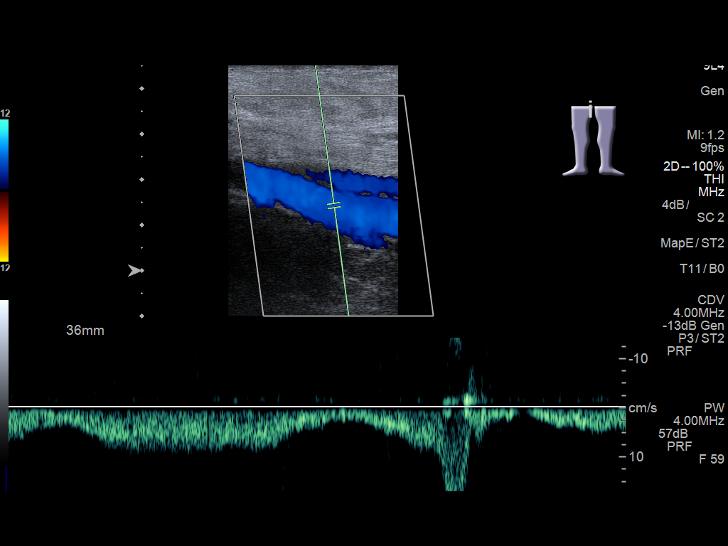
[im 21/61]
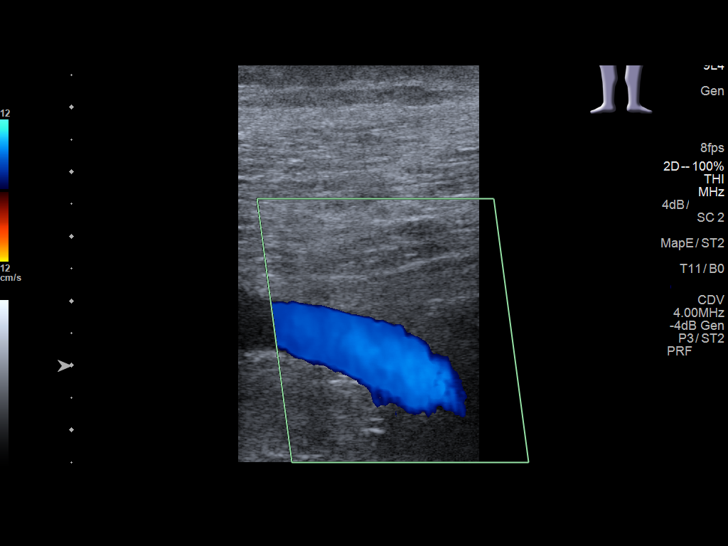
[im 27/61]
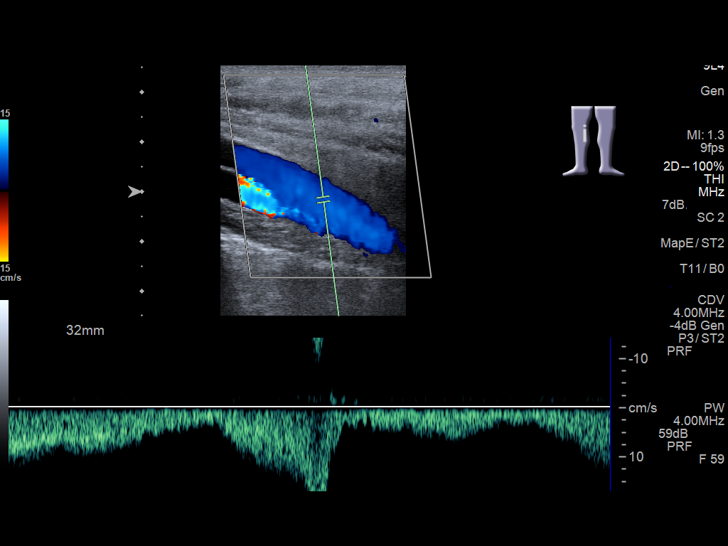
[im 32/61]
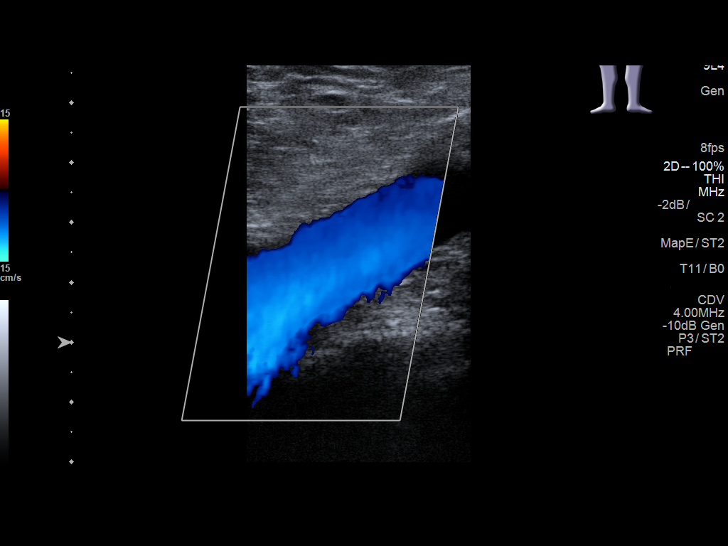
[im 34/61]
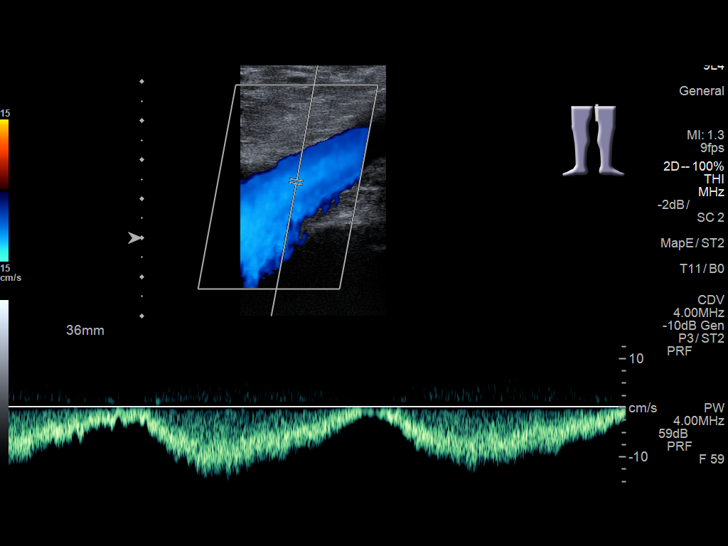
[im 40/61]
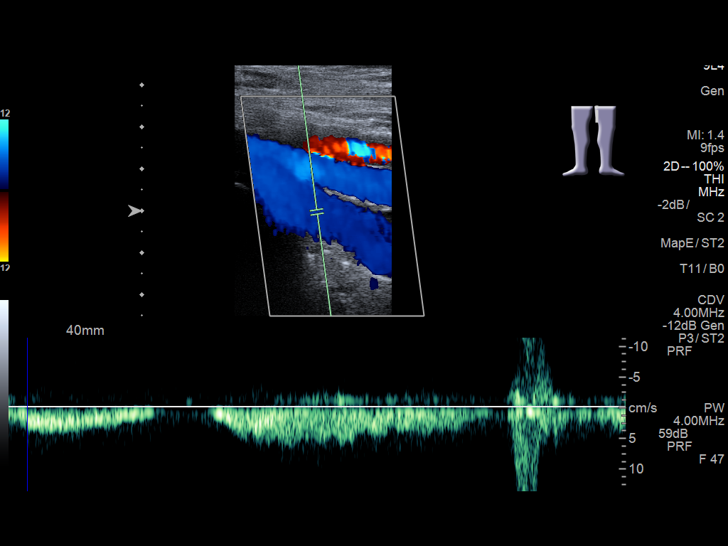
[im 45/61]
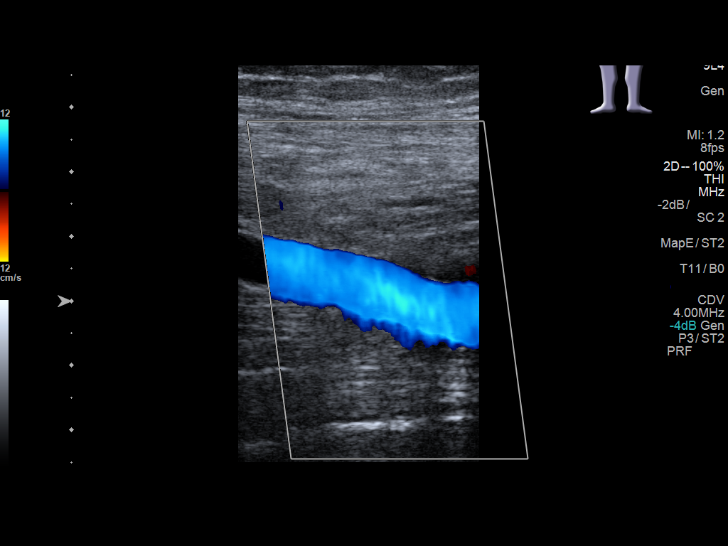
[im 50/61]
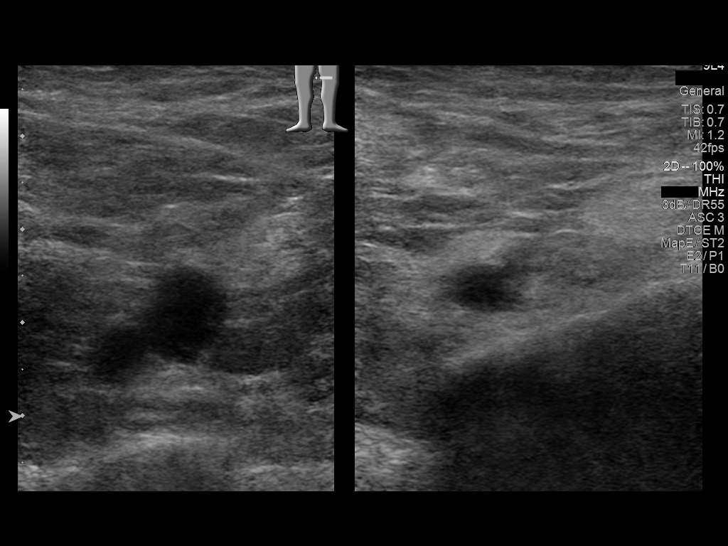
[im 55/61]
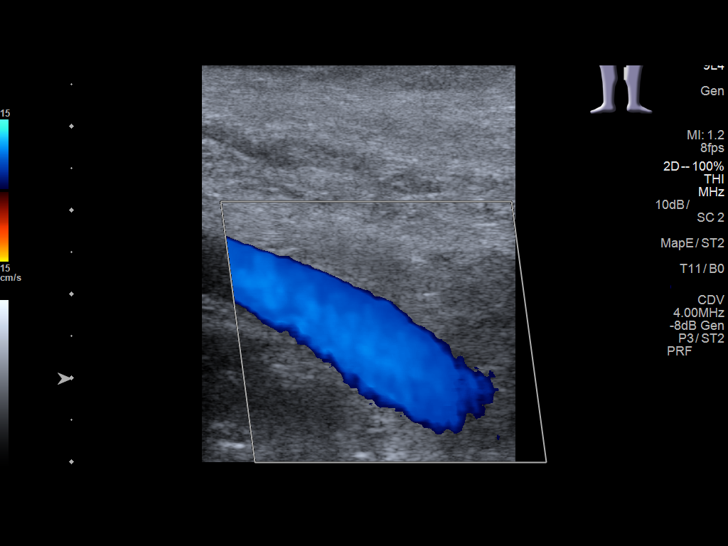
[im 61/61]
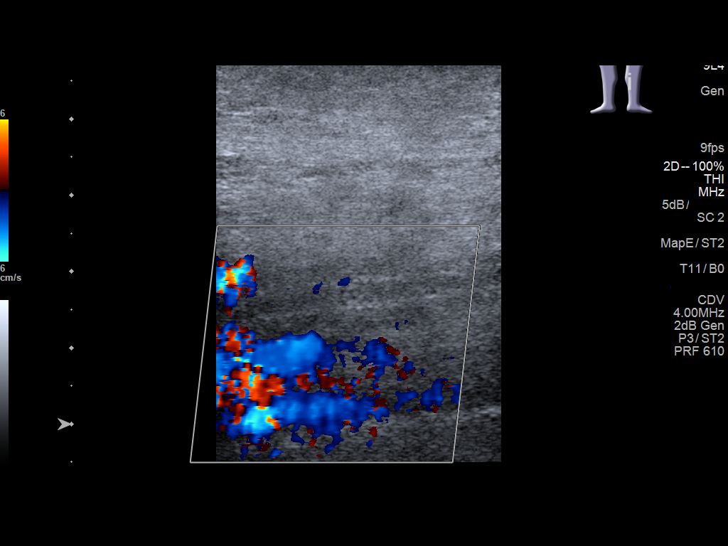

[13 of 24 positions shown; findings below may reference images not displayed]

FINDINGS: RIGHT LOWER EXTREMITY

Common Femoral Vein: No evidence of thrombus. Normal
compressibility, respiratory phasicity and response to augmentation.

Saphenofemoral Junction: No evidence of thrombus. Normal
compressibility and flow on color Doppler imaging.

Profunda Femoral Vein: No evidence of thrombus. Normal
compressibility and flow on color Doppler imaging.

Femoral Vein: No evidence of thrombus. Normal compressibility,
respiratory phasicity and response to augmentation.

Popliteal Vein: No evidence of thrombus. Normal compressibility,
respiratory phasicity and response to augmentation.

Calf Veins: No evidence of thrombus. Normal compressibility and flow
on color Doppler imaging.

Superficial Great Saphenous Vein: No evidence of thrombus. Normal
compressibility and flow on color Doppler imaging.

Venous Reflux:  None.

Other Findings:  None.

LEFT LOWER EXTREMITY

Common Femoral Vein: No evidence of thrombus. Normal
compressibility, respiratory phasicity and response to augmentation.

Saphenofemoral Junction: No evidence of thrombus. Normal
compressibility and flow on color Doppler imaging.

Profunda Femoral Vein: No evidence of thrombus. Normal
compressibility and flow on color Doppler imaging.

Femoral Vein: No evidence of thrombus. Normal compressibility,
respiratory phasicity and response to augmentation.

Popliteal Vein: No evidence of thrombus. Normal compressibility,
respiratory phasicity and response to augmentation.

Calf Veins: No evidence of thrombus. Normal compressibility and flow
on color Doppler imaging.

Superficial Great Saphenous Vein: No evidence of thrombus. Normal
compressibility and flow on color Doppler imaging.

Venous Reflux:  None.

Other Findings:  None.
IMPRESSION: No evidence of deep venous thrombosis in the bilateral lower
extremities.

## 2018-02-02 DIAGNOSIS — L97529 Non-pressure chronic ulcer of other part of left foot with unspecified severity: Secondary | ICD-10-CM | POA: Diagnosis not present

## 2018-02-02 DIAGNOSIS — L97519 Non-pressure chronic ulcer of other part of right foot with unspecified severity: Secondary | ICD-10-CM | POA: Diagnosis not present

## 2018-02-02 DIAGNOSIS — E11621 Type 2 diabetes mellitus with foot ulcer: Secondary | ICD-10-CM | POA: Diagnosis not present

## 2018-02-02 DIAGNOSIS — I739 Peripheral vascular disease, unspecified: Secondary | ICD-10-CM | POA: Diagnosis not present

## 2018-02-03 DIAGNOSIS — E785 Hyperlipidemia, unspecified: Secondary | ICD-10-CM | POA: Diagnosis not present

## 2018-02-03 DIAGNOSIS — R7303 Prediabetes: Secondary | ICD-10-CM | POA: Diagnosis not present

## 2018-02-03 DIAGNOSIS — I739 Peripheral vascular disease, unspecified: Secondary | ICD-10-CM | POA: Diagnosis not present

## 2018-02-03 DIAGNOSIS — L97511 Non-pressure chronic ulcer of other part of right foot limited to breakdown of skin: Secondary | ICD-10-CM | POA: Diagnosis not present

## 2018-02-03 DIAGNOSIS — L97512 Non-pressure chronic ulcer of other part of right foot with fat layer exposed: Secondary | ICD-10-CM | POA: Diagnosis not present

## 2018-02-03 DIAGNOSIS — L97521 Non-pressure chronic ulcer of other part of left foot limited to breakdown of skin: Secondary | ICD-10-CM | POA: Diagnosis not present

## 2018-02-03 DIAGNOSIS — L97522 Non-pressure chronic ulcer of other part of left foot with fat layer exposed: Secondary | ICD-10-CM | POA: Diagnosis not present

## 2018-02-03 DIAGNOSIS — K219 Gastro-esophageal reflux disease without esophagitis: Secondary | ICD-10-CM | POA: Diagnosis not present

## 2018-02-03 DIAGNOSIS — G629 Polyneuropathy, unspecified: Secondary | ICD-10-CM | POA: Diagnosis not present

## 2018-02-03 DIAGNOSIS — I10 Essential (primary) hypertension: Secondary | ICD-10-CM | POA: Diagnosis not present

## 2018-02-03 DIAGNOSIS — I5023 Acute on chronic systolic (congestive) heart failure: Secondary | ICD-10-CM | POA: Diagnosis not present

## 2018-09-05 IMAGING — CR DG CHEST 2V
2 series · 2 of 2 positions shown · non-contrast
Comparison: PA and lateral chest x-ray November 25, 2015

CLINICAL DATA: Shortness of breath. History of COPD, diabetes,
hypertension, previous CABG.

EXAM:
CHEST  2 VIEW

[chest pa]
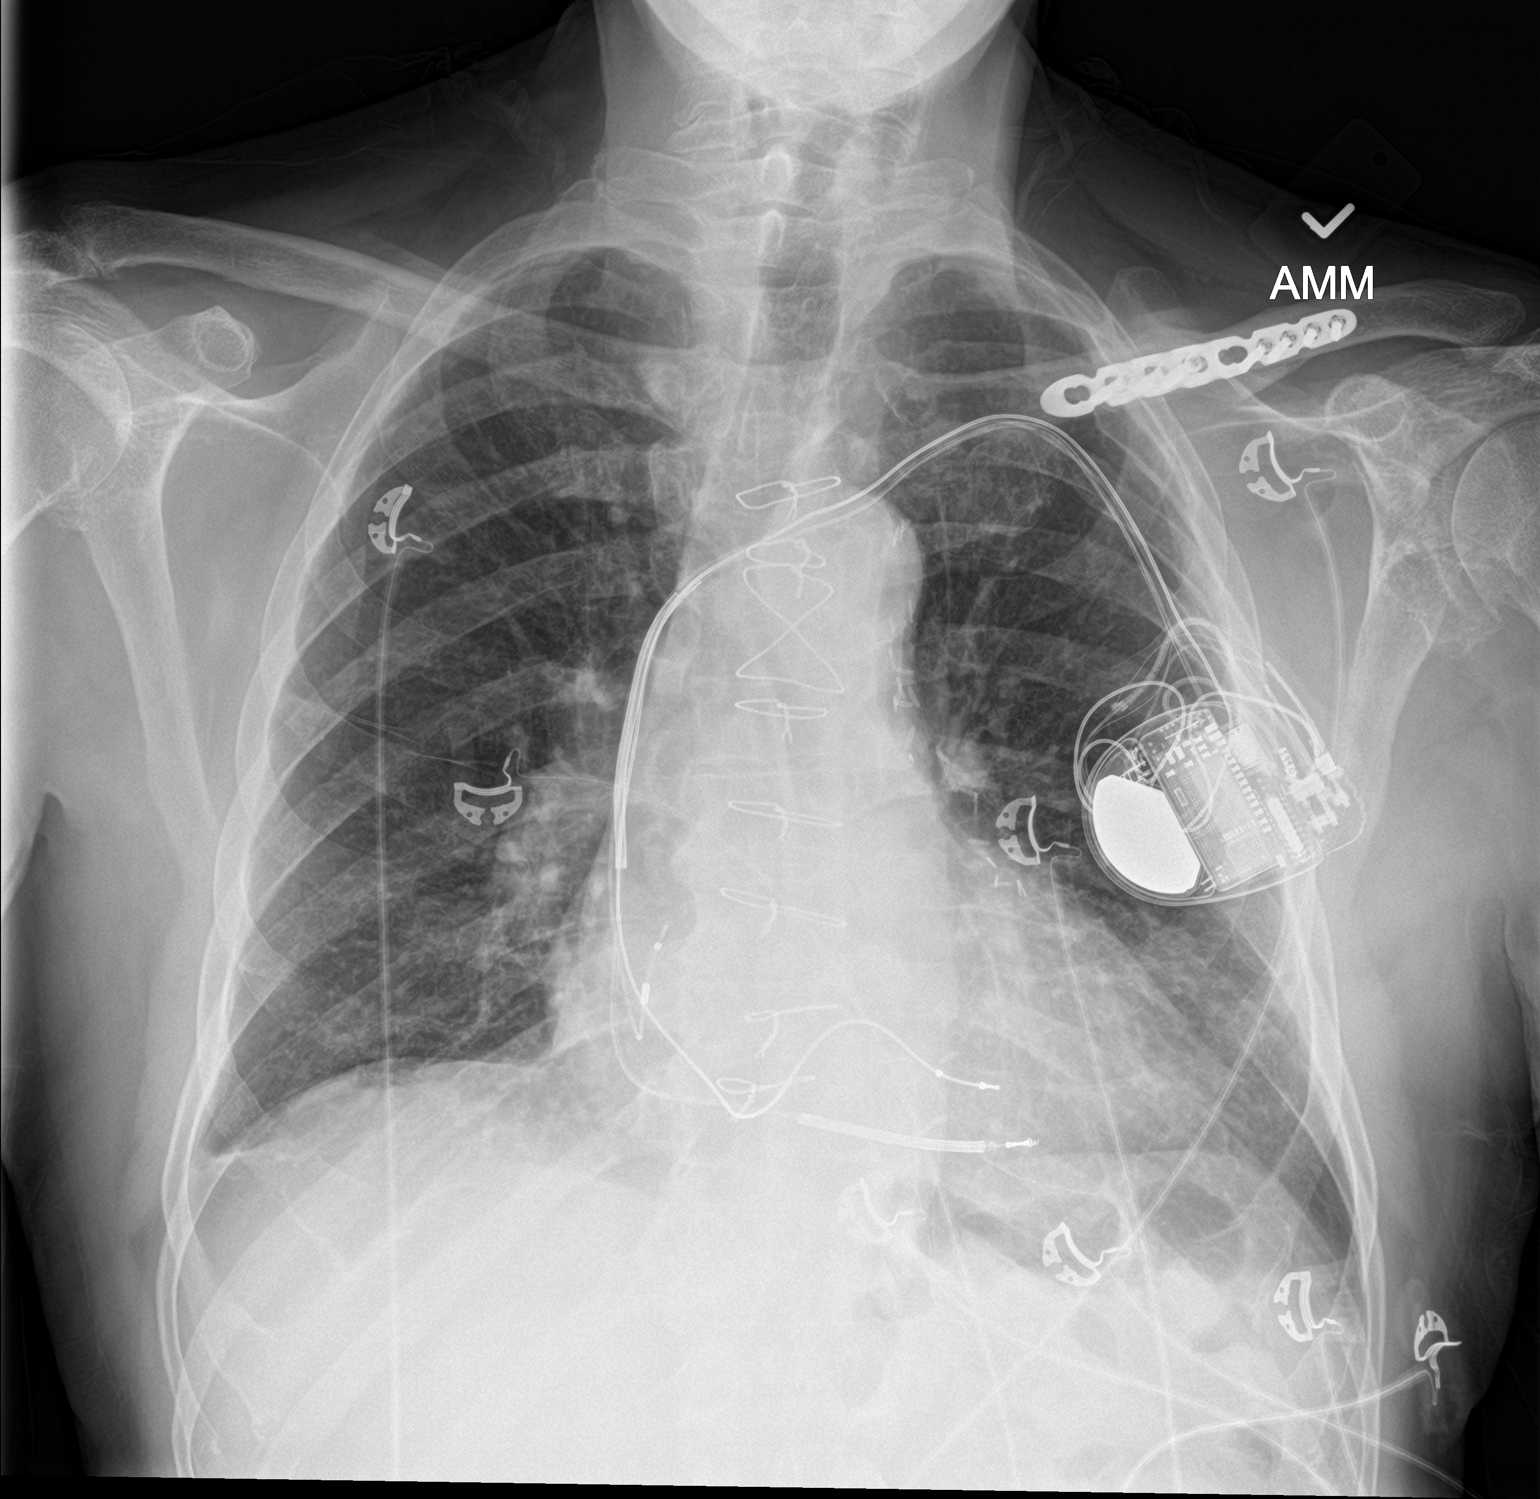

[chest lat]
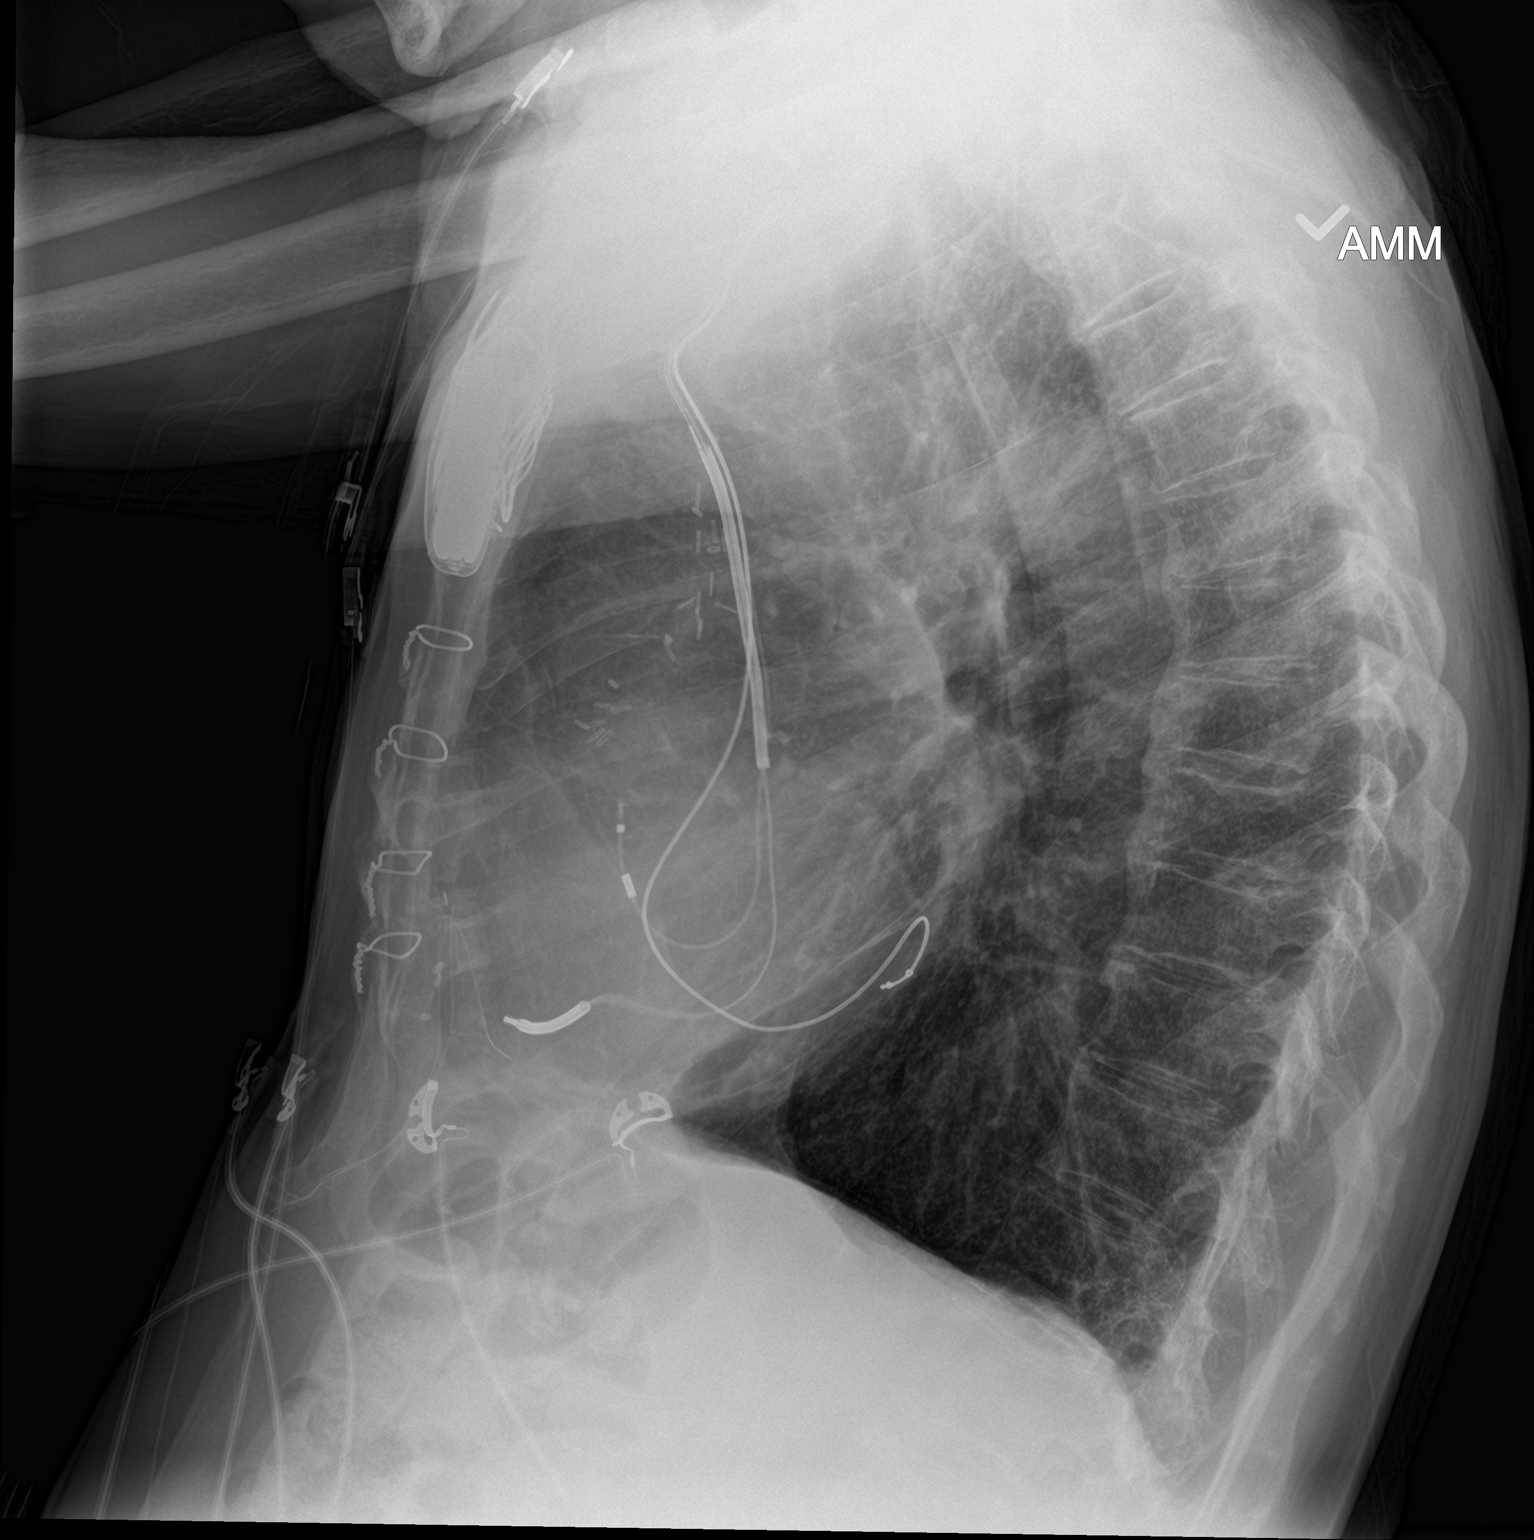

[2 of 2 positions shown; findings below may reference images not displayed]

FINDINGS: The lungs are mildly hyperinflated with hemidiaphragm flattening.
There is stable scarring at the right lung base. There is no
alveolar infiltrate or pleural effusion. The heart and pulmonary
vascularity are normal. There are post CABG changes. The ICD is in
stable position. The sternal wires are intact. There is
calcification in the wall of the aortic arch. There is mild
multilevel degenerative disc disease of the thoracic spine. The
patient has undergone plate and screw fixation of the mid shaft left
clavicular fracture in the past.
IMPRESSION: COPD.  Previous CABG.  No evidence of CHF nor pneumonia.

## 2018-09-06 ENCOUNTER — Encounter

## 2018-09-13 ENCOUNTER — Ambulatory Visit: Payer: MEDICARE | Primary: Adult Health

## 2018-09-29 ENCOUNTER — Inpatient Hospital Stay: Admit: 2018-09-29 | Payer: MEDICARE | Attending: Podiatrist | Primary: Adult Health

## 2018-09-29 DIAGNOSIS — I739 Peripheral vascular disease, unspecified: Secondary | ICD-10-CM

## 2018-09-30 LAB — ANKLE BRACHIAL INDEX
Left ABI: 1.03
Left TBI: 0.4
Left anterior tibial: 119 mmHg
Left arm BP: 112 mmHg
Left posterior tibial: 114 mmHg
Left toe pressure: 46 mmHg
Right ABI: 1.22
Right TBI: 0.44
Right anterior tibial: 140 mmHg
Right arm BP: 115 mmHg
Right posterior tibial: 128 mmHg
Right toe pressure: 51 mmHg

## 2018-09-30 LAB — VAS ANKLE BRACHIAL INDEX (ABI)
Left ABI: 1.03
Left TBI: 0.4
Left anterior tibial: 119 mmHg
Left arm BP: 112 mmHg
Left posterior tibial: 114 mmHg
Left toe pressure: 46 mmHg
Right ABI: 1.22
Right TBI: 0.44
Right anterior tibial: 140 mmHg
Right arm BP: 115 mmHg
Right posterior tibial: 128 mmHg
Right toe pressure: 51 mmHg

## 2018-10-21 ENCOUNTER — Emergency Department: Admit: 2018-10-22 | Payer: MEDICARE | Primary: Adult Health

## 2018-10-21 DIAGNOSIS — E860 Dehydration: Secondary | ICD-10-CM

## 2018-10-21 NOTE — ED Notes (Signed)
Assumed care of pt

## 2018-10-21 NOTE — ED Notes (Signed)
Sequeira MD reviewed discharge instructions with the patient. The patient verbalized understanding. All questions and concerns were addressed. The patient is discharged by wheelchair with instructions and prescriptions in hand.  Pt is alert and oriented x 4. Respirations are clear and unlabored.
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
## 2018-10-22 ENCOUNTER — Inpatient Hospital Stay
Admit: 2018-10-22 | Discharge: 2018-10-22 | Disposition: A | Discharge status: Home / Self Care | Payer: MEDICARE | Attending: Emergency Medicine

## 2018-10-22 LAB — CBC WITH AUTOMATED DIFF
ABS. BASOPHILS: 0 10*3/uL (ref 0.0–0.1)
ABS. EOSINOPHILS: 0.2 10*3/uL (ref 0.0–0.4)
ABS. IMM. GRANS.: 0 10*3/uL (ref 0.00–0.04)
ABS. LYMPHOCYTES: 1.1 10*3/uL (ref 0.8–3.5)
ABS. MONOCYTES: 0.6 10*3/uL (ref 0.0–1.0)
ABS. NEUTROPHILS: 6.4 10*3/uL (ref 1.8–8.0)
ABSOLUTE NRBC: 0 10*3/uL (ref 0.00–0.01)
BASOPHILS: 0 % (ref 0–1)
EOSINOPHILS: 2 % (ref 0–7)
HCT: 42.7 % (ref 36.6–50.3)
HGB: 14.4 g/dL (ref 12.1–17.0)
IMMATURE GRANULOCYTES: 1 % — ABNORMAL HIGH (ref 0.0–0.5)
LYMPHOCYTES: 14 % (ref 12–49)
MCH: 30.9 PG (ref 26.0–34.0)
MCHC: 33.7 g/dL (ref 30.0–36.5)
MCV: 91.6 FL (ref 80.0–99.0)
MONOCYTES: 7 % (ref 5–13)
MPV: 10.7 FL (ref 8.9–12.9)
NEUTROPHILS: 76 % — ABNORMAL HIGH (ref 32–75)
NRBC: 0 PER 100 WBC
PLATELET: 261 10*3/uL (ref 150–400)
RBC: 4.66 M/uL (ref 4.10–5.70)
RDW: 13.7 % (ref 11.5–14.5)
WBC: 8.3 10*3/uL (ref 4.1–11.1)

## 2018-10-22 LAB — NT-PRO BNP: NT pro-BNP: 541 PG/ML — ABNORMAL HIGH (ref <450)

## 2018-10-22 LAB — EKG, 12 LEAD, INITIAL
Atrial Rate: 62 {beats}/min
Calculated R Axis: -98 degrees
Calculated T Axis: 97 degrees
P-R Interval: 172 ms
Q-T Interval: 478 ms
QRS Duration: 144 ms
QTC Calculation (Bezet): 485 ms
Ventricular Rate: 62 {beats}/min

## 2018-10-22 LAB — METABOLIC PANEL, COMPREHENSIVE
A-G Ratio: 0.7 — ABNORMAL LOW (ref 1.1–2.2)
ALT (SGPT): 11 U/L — ABNORMAL LOW (ref 12–78)
AST (SGOT): 14 U/L — ABNORMAL LOW (ref 15–37)
Albumin: 3.3 g/dL — ABNORMAL LOW (ref 3.5–5.0)
Alk. phosphatase: 86 U/L (ref 45–117)
Anion gap: 4 mmol/L — ABNORMAL LOW (ref 5–15)
BUN/Creatinine ratio: 10 — ABNORMAL LOW (ref 12–20)
BUN: 18 MG/DL (ref 6–20)
Bilirubin, total: 0.4 MG/DL (ref 0.2–1.0)
CO2: 25 mmol/L (ref 21–32)
Calcium: 8.8 MG/DL (ref 8.5–10.1)
Chloride: 112 mmol/L — ABNORMAL HIGH (ref 97–108)
Creatinine: 1.72 MG/DL — ABNORMAL HIGH (ref 0.70–1.30)
GFR est AA: 46 mL/min/{1.73_m2} — ABNORMAL LOW (ref >60)
GFR est non-AA: 38 mL/min/{1.73_m2} — ABNORMAL LOW (ref >60)
Globulin: 4.6 g/dL — ABNORMAL HIGH (ref 2.0–4.0)
Glucose: 119 mg/dL — ABNORMAL HIGH (ref 65–100)
Potassium: 3.5 mmol/L (ref 3.5–5.1)
Protein, total: 7.9 g/dL (ref 6.4–8.2)
Sodium: 141 mmol/L (ref 136–145)

## 2018-10-22 LAB — TROPONIN I: Troponin-I, Qt.: <0.05 ng/mL (ref <0.05)

## 2018-10-22 LAB — COMPREHENSIVE METABOLIC PANEL
ALT: 11 U/L — ABNORMAL LOW (ref 12–78)
AST: 14 U/L — ABNORMAL LOW (ref 15–37)
Albumin/Globulin Ratio: 0.7 — ABNORMAL LOW (ref 1.1–2.2)
Albumin: 3.3 g/dL — ABNORMAL LOW (ref 3.5–5.0)
Alkaline Phosphatase: 86 U/L (ref 45–117)
Anion Gap: 4 mmol/L — ABNORMAL LOW (ref 5–15)
BUN: 18 MG/DL (ref 6–20)
Bun/Cre Ratio: 10 — ABNORMAL LOW (ref 12–20)
CO2: 25 mmol/L (ref 21–32)
Calcium: 8.8 MG/DL (ref 8.5–10.1)
Chloride: 112 mmol/L — ABNORMAL HIGH (ref 97–108)
Creatinine: 1.72 MG/DL — ABNORMAL HIGH (ref 0.70–1.30)
EGFR IF NonAfrican American: 38 mL/min/{1.73_m2} — ABNORMAL LOW (ref >60)
GFR African American: 46 mL/min/{1.73_m2} — ABNORMAL LOW (ref >60)
Globulin: 4.6 g/dL — ABNORMAL HIGH (ref 2.0–4.0)
Glucose: 119 mg/dL — ABNORMAL HIGH (ref 65–100)
Potassium: 3.5 mmol/L (ref 3.5–5.1)
Sodium: 141 mmol/L (ref 136–145)
Total Bilirubin: 0.4 MG/DL (ref 0.2–1.0)
Total Protein: 7.9 g/dL (ref 6.4–8.2)

## 2018-10-22 LAB — CBC WITH AUTO DIFFERENTIAL
Basophils %: 0 % (ref 0–1)
Basophils Absolute: 0 10*3/uL (ref 0.0–0.1)
Eosinophils %: 2 % (ref 0–7)
Eosinophils Absolute: 0.2 10*3/uL (ref 0.0–0.4)
Granulocyte Absolute Count: 0 10*3/uL (ref 0.00–0.04)
Hematocrit: 42.7 % (ref 36.6–50.3)
Hemoglobin: 14.4 g/dL (ref 12.1–17.0)
Immature Granulocytes: 1 % — ABNORMAL HIGH (ref 0.0–0.5)
Lymphocytes %: 14 % (ref 12–49)
Lymphocytes Absolute: 1.1 10*3/uL (ref 0.8–3.5)
MCH: 30.9 PG (ref 26.0–34.0)
MCHC: 33.7 g/dL (ref 30.0–36.5)
MCV: 91.6 FL (ref 80.0–99.0)
MPV: 10.7 FL (ref 8.9–12.9)
Monocytes %: 7 % (ref 5–13)
Monocytes Absolute: 0.6 10*3/uL (ref 0.0–1.0)
NRBC Absolute: 0 10*3/uL (ref 0.00–0.01)
Neutrophils %: 76 % — ABNORMAL HIGH (ref 32–75)
Neutrophils Absolute: 6.4 10*3/uL (ref 1.8–8.0)
Nucleated RBCs: 0 PER 100 WBC
Platelets: 261 10*3/uL (ref 150–400)
RBC: 4.66 M/uL (ref 4.10–5.70)
RDW: 13.7 % (ref 11.5–14.5)
WBC: 8.3 10*3/uL (ref 4.1–11.1)

## 2018-10-22 LAB — EKG 12-LEAD
Atrial Rate: 62 {beats}/min
P-R Interval: 172 ms
Q-T Interval: 478 ms
QRS Duration: 144 ms
QTc Calculation (Bazett): 485 ms
R Axis: -98 degrees
T Axis: 97 degrees
Ventricular Rate: 62 {beats}/min

## 2018-10-22 LAB — TROPONIN: Troponin I: <0.05 ng/mL (ref <0.05)

## 2018-10-22 LAB — PROBNP, N-TERMINAL: BNP: 541 PG/ML — ABNORMAL HIGH (ref <450)

## 2018-10-22 MED ORDER — SODIUM CHLORIDE 0.9% BOLUS IV
0.9 % | INTRAVENOUS | Status: AC
Start: 2018-10-22 — End: 2018-10-21
  Administered 2018-10-22: 01:00:00 via INTRAVENOUS

## 2018-10-22 MED FILL — SODIUM CHLORIDE 0.9 % IV: INTRAVENOUS | Qty: 500

## 2019-02-11 ENCOUNTER — Emergency Department: Admit: 2019-02-11 | Payer: MEDICARE | Primary: Adult Health

## 2019-02-11 ENCOUNTER — Inpatient Hospital Stay
Admit: 2019-02-11 | Discharge: 2019-02-12 | Disposition: A | Discharge status: Home / Self Care | Payer: MEDICARE | Attending: Emergency Medicine

## 2019-02-11 DIAGNOSIS — K5900 Constipation, unspecified: Secondary | ICD-10-CM

## 2019-02-11 MED ORDER — MAGNESIUM CITRATE ORAL SOLN
ORAL | Status: AC
Start: 2019-02-11 — End: 2019-02-11
  Administered 2019-02-11: via ORAL

## 2019-02-11 MED FILL — MAGNESIUM CITRATE ORAL SOLN: ORAL | Qty: 296

## 2019-02-11 NOTE — ED Notes (Signed)
Completed magnesium citrate. Tolerating PO without difficulty.

## 2019-02-11 NOTE — ED Notes (Signed)
Pt ambulatory to bathroom, reports he had a bowel movement. Is ready to go home. Discharge instructions given. Using phone to contact granddaughter for discharge transportation.

## 2019-02-11 NOTE — ED Notes (Signed)
Assumed care of pt from triage. Pt reports he has been unable to have a bowel movement for 5-6 days. Denies abdominal pain and vomiting. Notes he has not been eating as much due to feeling of fullness. Positioned for comfort on stretcher. Call bell within reach. Monitor x2.
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
## 2019-02-11 NOTE — ED Notes (Signed)
Pt ambulatory to bathroom, reports he had a bowel movement. Is ready to go home. Discharge instructions given. Using phone to contact granddaughter for discharge transportation.

## 2019-02-11 NOTE — ED Notes (Signed)
Completed magnesium citrate. Tolerating PO without difficulty.

## 2019-02-11 NOTE — ED Notes (Signed)
Assumed care of pt from triage. Pt reports he has been unable to have a bowel movement for 5-6 days. Denies abdominal pain and vomiting. Notes he has not been eating as much due to feeling of fullness. Positioned for comfort on stretcher. Call bell within reach. Monitor x2.

## 2019-02-12 MED ORDER — POLYETHYLENE GLYCOL 3350 100 % ORAL POWDER
17 gram/dose | Freq: Every day | ORAL | 0 refills | Status: AC
Start: 2019-02-12 — End: 2019-02-25

## 2019-03-03 IMAGING — CR DG FOOT COMPLETE 3+V*L*
1 series · 3 of 3 positions shown · non-contrast
Comparison: October 23, 2014

CLINICAL DATA: Ulcer along plantar aspect of foot. Diabetes
mellitus.

EXAM:
LEFT FOOT - COMPLETE 3+ VIEW

[Series 1: x foot ap left · 0.14mm/px · 3 of 3 slices shown]
[im 1/3]
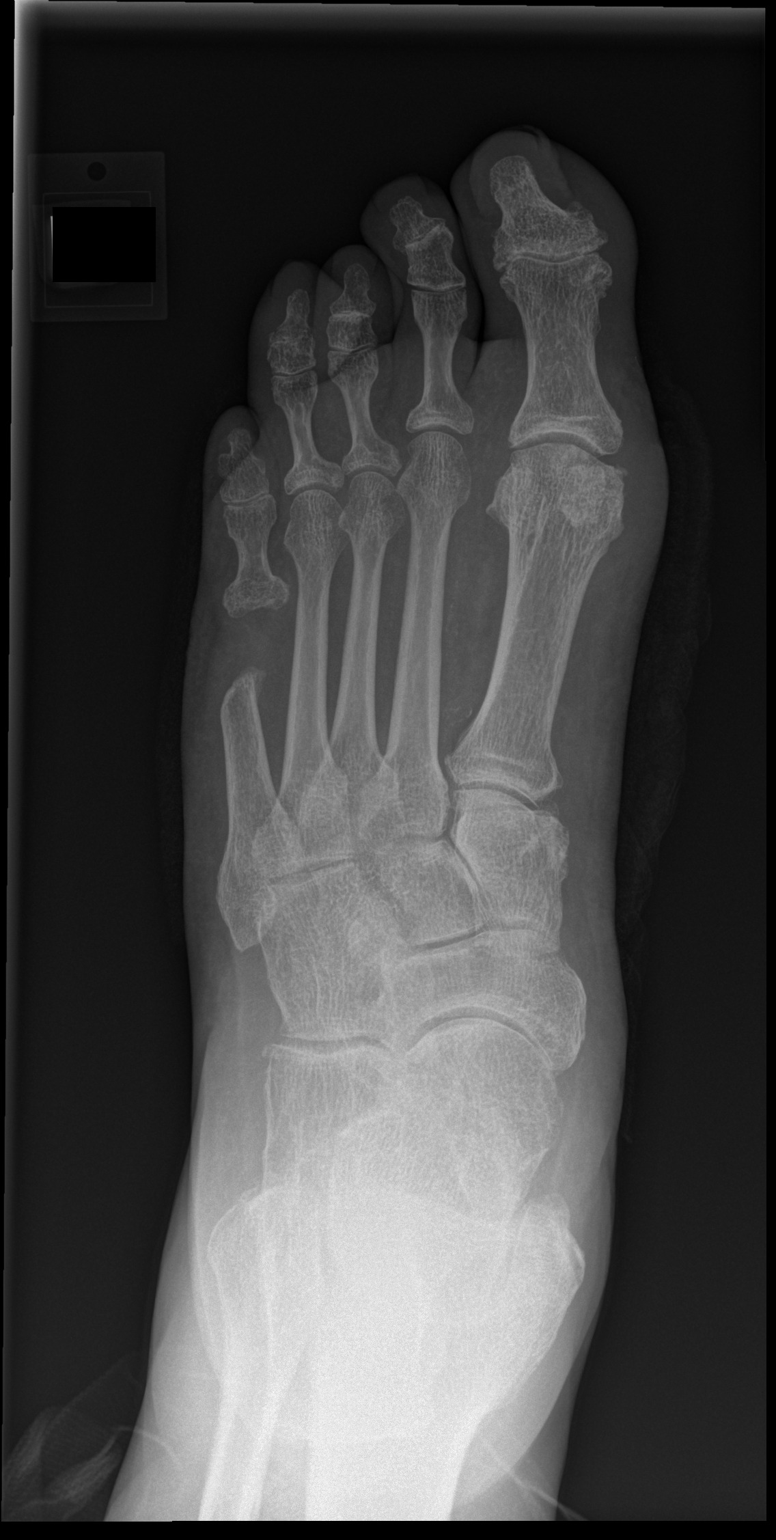
[im 2/3]
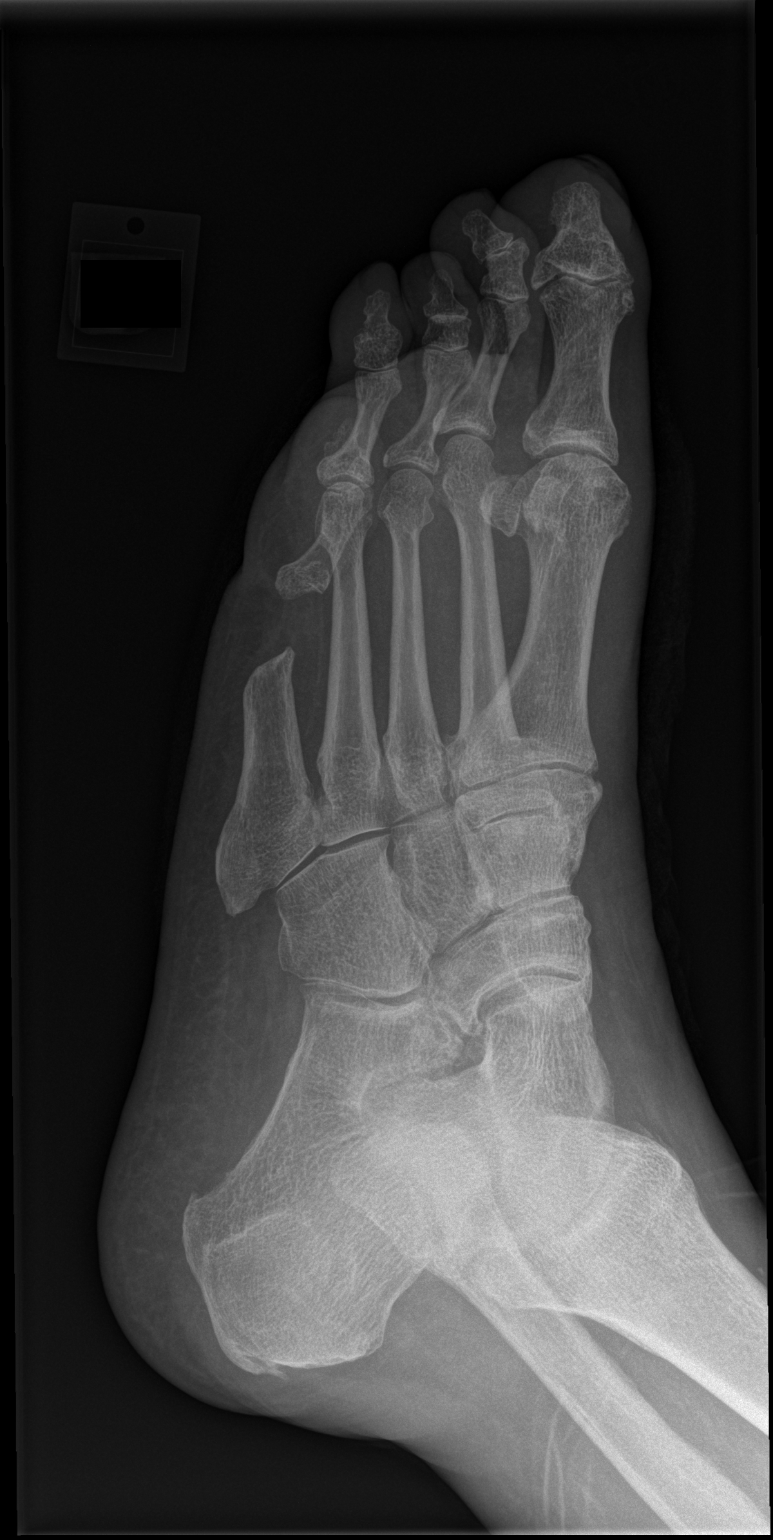
[im 3/3]
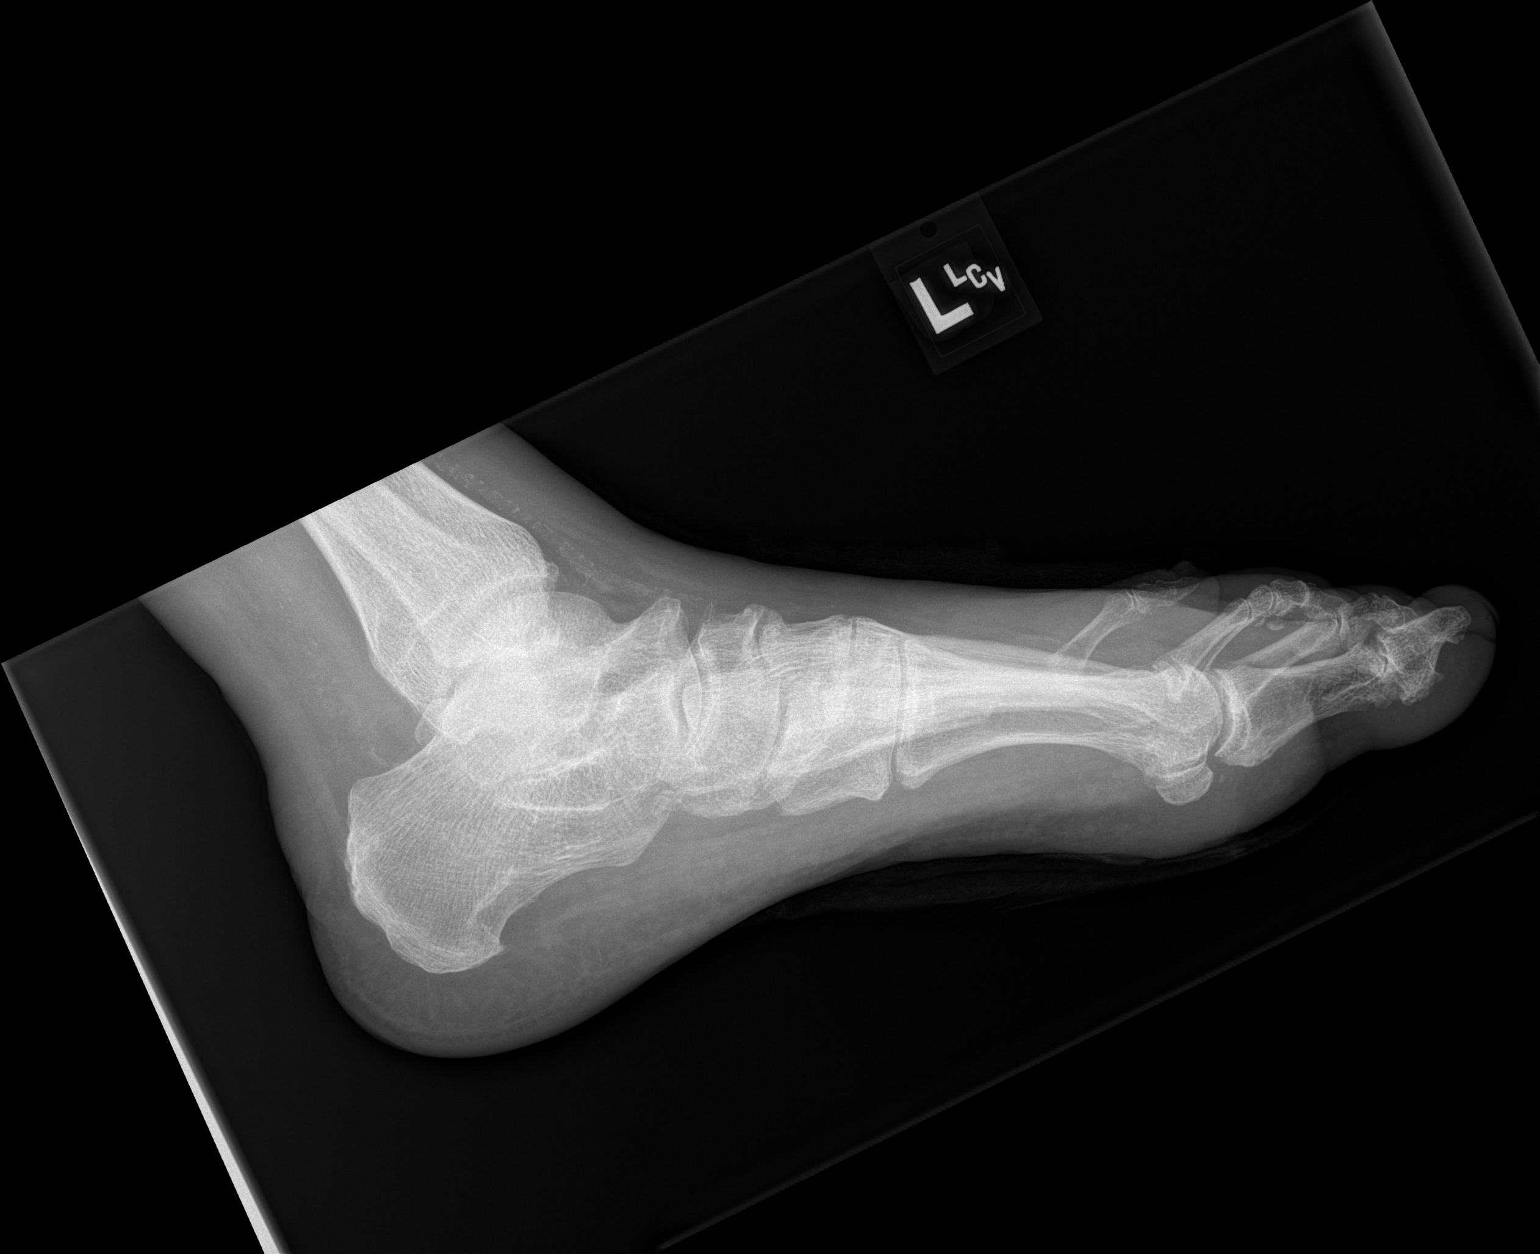

[3 of 3 positions shown; findings below may reference images not displayed]

FINDINGS: Frontal, oblique, and lateral views were obtained. The patient has
had surgical removal of the distal aspect of the fifth metatarsal,
unchanged. On the current examination, there is no demonstrable bony
destruction. No fracture or dislocation. There is narrowing of the
first MTP joint as well as all PIP and DIP joints. There is spurring
in the dorsal midfoot region. There are multiple foci of vascular
calcification. There is soft tissue swelling along the volar aspect
of the forefoot. No soft tissue air is seen in this area.
IMPRESSION: Status post previous surgical removal of the distal aspect of the
fifth metatarsal, unchanged. Areas of osteoarthritis at multiple
joints noted. Soft tissue swelling noted along the volar aspect of
the forefoot without soft tissue air in this area. No erosive change
or bony destruction is appreciable. Extensive arterial vascular
calcifications likely secondary to diabetes mellitus.

If there remains concern for osteomyelitis, either MR of the foot
pre and post contrast or nuclear medicine three-phase bone scan
could be helpful to further assess.

## 2019-05-17 ENCOUNTER — Inpatient Hospital Stay
Admit: 2019-05-17 | Discharge: 2019-05-17 | Disposition: A | Discharge status: Home / Self Care | Payer: MEDICARE | Attending: Emergency Medicine

## 2019-05-17 ENCOUNTER — Emergency Department: Admit: 2019-05-17 | Payer: MEDICARE | Primary: Adult Health

## 2019-05-17 DIAGNOSIS — L97512 Non-pressure chronic ulcer of other part of right foot with fat layer exposed: Secondary | ICD-10-CM

## 2019-05-17 LAB — CBC WITH AUTOMATED DIFF
ABS. BASOPHILS: 0 10*3/uL (ref 0.0–0.1)
ABS. EOSINOPHILS: 0.1 10*3/uL (ref 0.0–0.4)
ABS. IMM. GRANS.: 0 10*3/uL (ref 0.00–0.04)
ABS. LYMPHOCYTES: 1.1 10*3/uL (ref 0.8–3.5)
ABS. MONOCYTES: 0.6 10*3/uL (ref 0.0–1.0)
ABS. NEUTROPHILS: 5.7 10*3/uL (ref 1.8–8.0)
ABSOLUTE NRBC: 0 10*3/uL (ref 0.00–0.01)
BASOPHILS: 0 % (ref 0–1)
EOSINOPHILS: 1 % (ref 0–7)
HCT: 35.4 % — ABNORMAL LOW (ref 36.6–50.3)
HGB: 11.7 g/dL — ABNORMAL LOW (ref 12.1–17.0)
IMMATURE GRANULOCYTES: 0 % (ref 0.0–0.5)
LYMPHOCYTES: 15 % (ref 12–49)
MCH: 28.3 PG (ref 26.0–34.0)
MCHC: 33.1 g/dL (ref 30.0–36.5)
MCV: 85.5 FL (ref 80.0–99.0)
MONOCYTES: 8 % (ref 5–13)
MPV: 10.8 FL (ref 8.9–12.9)
NEUTROPHILS: 76 % — ABNORMAL HIGH (ref 32–75)
NRBC: 0 PER 100 WBC
PLATELET: 276 10*3/uL (ref 150–400)
RBC: 4.14 M/uL (ref 4.10–5.70)
RDW: 15.2 % — ABNORMAL HIGH (ref 11.5–14.5)
WBC: 7.4 10*3/uL (ref 4.1–11.1)

## 2019-05-17 LAB — POC TROPONIN-I
POC Troponin I: <0.04 ng/mL (ref 0.00–0.08)
Troponin-I (POC): <0.04 ng/mL (ref 0.00–0.08)

## 2019-05-17 LAB — GLUCOSE, POC: Glucose (POC): 95 mg/dL (ref 65–100)

## 2019-05-17 LAB — CK
CK: 196 U/L (ref 39–308)
Total CK: 196 U/L (ref 39–308)

## 2019-05-17 LAB — CBC WITH AUTO DIFFERENTIAL
Basophils %: 0 % (ref 0–1)
Basophils Absolute: 0 10*3/uL (ref 0.0–0.1)
Eosinophils %: 1 % (ref 0–7)
Eosinophils Absolute: 0.1 10*3/uL (ref 0.0–0.4)
Granulocyte Absolute Count: 0 10*3/uL (ref 0.00–0.04)
Hematocrit: 35.4 % — ABNORMAL LOW (ref 36.6–50.3)
Hemoglobin: 11.7 g/dL — ABNORMAL LOW (ref 12.1–17.0)
Immature Granulocytes: 0 % (ref 0.0–0.5)
Lymphocytes %: 15 % (ref 12–49)
Lymphocytes Absolute: 1.1 10*3/uL (ref 0.8–3.5)
MCH: 28.3 PG (ref 26.0–34.0)
MCHC: 33.1 g/dL (ref 30.0–36.5)
MCV: 85.5 FL (ref 80.0–99.0)
MPV: 10.8 FL (ref 8.9–12.9)
Monocytes %: 8 % (ref 5–13)
Monocytes Absolute: 0.6 10*3/uL (ref 0.0–1.0)
NRBC Absolute: 0 10*3/uL (ref 0.00–0.01)
Neutrophils %: 76 % — ABNORMAL HIGH (ref 32–75)
Neutrophils Absolute: 5.7 10*3/uL (ref 1.8–8.0)
Nucleated RBCs: 0 PER 100 WBC
Platelets: 276 10*3/uL (ref 150–400)
RBC: 4.14 M/uL (ref 4.10–5.70)
RDW: 15.2 % — ABNORMAL HIGH (ref 11.5–14.5)
WBC: 7.4 10*3/uL (ref 4.1–11.1)

## 2019-05-17 LAB — POCT GLUCOSE: POC Glucose: 95 mg/dL (ref 65–100)

## 2019-05-17 MED ORDER — NAPROXEN 500 MG TAB
500 mg | ORAL_TABLET | Freq: Two times a day (BID) | ORAL | 0 refills | Status: AC
Start: 2019-05-17 — End: 2019-05-27

## 2019-05-17 MED ORDER — CALCIUM CARBONATE 200 MG (500 MG) CHEWABLE TAB
200 mg calcium (500 mg) | ORAL | Status: AC
Start: 2019-05-17 — End: 2019-05-17
  Administered 2019-05-17: 13:00:00 via ORAL

## 2019-05-17 MED ORDER — GABAPENTIN 300 MG CAP
300 mg | ORAL_CAPSULE | Freq: Three times a day (TID) | ORAL | 0 refills | Status: AC
Start: 2019-05-17 — End: 2019-05-27

## 2019-05-17 MED ORDER — KETOROLAC TROMETHAMINE 30 MG/ML INJECTION
30 mg/mL (1 mL) | INTRAMUSCULAR | Status: AC
Start: 2019-05-17 — End: 2019-05-17
  Administered 2019-05-17: 10:00:00 via INTRAVENOUS

## 2019-05-17 MED FILL — ANTACID (CALCIUM CARBONATE) 200 MG CALCIUM (500 MG) CHEWABLE TABLET: 200 mg calcium (500 mg) | ORAL | Qty: 1

## 2019-05-17 MED FILL — KETOROLAC TROMETHAMINE 30 MG/ML INJECTION: 30 mg/mL (1 mL) | INTRAMUSCULAR | Qty: 1

## 2019-05-17 NOTE — ED Notes (Addendum)
TRANSFER - IN REPORT:    Verbal and bedside report received from Gray RN (name) on Michael Lozano.  Report consisted of patient???s Situation, Background, Assessment and   Recommendations(SBAR).   Information from the following report(s) SBAR and ED Summary was reviewed with the receiving nurse.  Opportunity for questions and clarification was provided.      0844: Pt discharged by Dr. Knorr. Pt provided with discharge instructions Rx and instructions on follow up care. Pt out of ED ambulatory. Pt declined wheelchair. Pt to wait in the waiting room for brother to pick up.
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
## 2019-05-17 NOTE — Progress Notes (Signed)
Spoke with patient on the phone and educated on wound culture results.  Cefdinir sent to preferred pharmacy based on susceptibility testing.  Additionally advised of red flag symptoms and strict ED precautions.

## 2019-05-17 NOTE — ED Notes (Signed)
Bedside shift change report given to Ellie, RN by Andrew, G., RN. Report included the following information SBAR, ED Summary, MAR and Recent Results.

## 2019-05-17 NOTE — ED Notes (Signed)
Wound dressed dry to dry.

## 2019-05-17 NOTE — Progress Notes (Signed)
Patient seen again yesterday, was given dose of IV vancomycin but was not discharged on antibiotic. Final results pending.

## 2019-05-17 NOTE — ED Notes (Signed)
Dirty dressing taken off pt R foot. Pt states changing own bandages.

## 2019-05-17 NOTE — Progress Notes (Signed)
No antibiotics
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
## 2019-05-17 NOTE — ED Notes (Signed)
Wound dressed dry to dry.

## 2019-05-17 NOTE — ED Notes (Signed)
Dirty dressing taken off pt R foot. Pt states changing own bandages.

## 2019-05-17 NOTE — Progress Notes (Signed)
Patient seen again yesterday, was given dose of IV vancomycin but was not discharged on antibiotic. Final results pending.

## 2019-05-17 NOTE — Progress Notes (Signed)
Spoke with patient on the phone and educated on wound culture results.  Cefdinir sent to preferred pharmacy based on susceptibility testing.  Additionally advised of red flag symptoms and strict ED precautions.

## 2019-05-17 NOTE — ED Notes (Signed)
TRANSFER - IN REPORT:    Verbal and bedside report received from [employer] (name) on [employer].  Report consisted of patient's Situation, Background, Assessment and   Recommendations(SBAR).   Information from the following report(s) SBAR and ED Summary was reviewed with the receiving nurse.  Opportunity for questions and clarification was provided.      9629: Pt discharged by Dr. Lourdes Sledge. Pt provided with discharge instructions Rx and instructions on follow up care. Pt out of ED ambulatory. Pt declined wheelchair. Pt to wait in the waiting room for brother to pick up.

## 2019-05-18 ENCOUNTER — Emergency Department: Admit: 2019-05-18 | Payer: MEDICARE | Primary: Adult Health

## 2019-05-18 ENCOUNTER — Inpatient Hospital Stay
Admit: 2019-05-18 | Discharge: 2019-05-19 | Disposition: A | Discharge status: Home / Self Care | Payer: MEDICARE | Attending: Emergency Medicine

## 2019-05-18 DIAGNOSIS — U071 COVID-19: Secondary | ICD-10-CM

## 2019-05-18 MED ORDER — SODIUM CHLORIDE 0.9 % IJ SYRG
INTRAMUSCULAR | Status: DC | PRN
Start: 2019-05-18 — End: 2019-05-19

## 2019-05-18 MED ORDER — VANCOMYCIN IN 0.9 % SODIUM CHLORIDE 1.75 GRAM/500 ML IV
1.75 gram/500 mL | INTRAVENOUS | Status: AC
Start: 2019-05-18 — End: 2019-05-18
  Administered 2019-05-19: 01:00:00 via INTRAVENOUS

## 2019-05-18 MED ORDER — ACETAMINOPHEN 500 MG TAB
500 mg | ORAL | Status: AC
Start: 2019-05-18 — End: 2019-05-18
  Administered 2019-05-19: 01:00:00 via ORAL

## 2019-05-18 MED FILL — VANCOMYCIN 10 GRAM IV SOLR: 10 gram | INTRAVENOUS | Qty: 1750

## 2019-05-18 MED FILL — ACETAMINOPHEN 500 MG TAB: 500 mg | ORAL | Qty: 2

## 2019-05-18 MED FILL — BD POSIFLUSH NORMAL SALINE 0.9 % INJECTION SYRINGE: INTRAMUSCULAR | Qty: 10
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
## 2019-05-18 NOTE — ED Notes (Signed)
Sequira MD reviewed discharge instructions with the patient.  The patient verbalized understanding.  All questions and concerns were addressed.  The patient declined a wheelchair and is discharged ambulatory in the care of family members with instructions and prescriptions in hand.  Pt is alert and oriented x 4.  Respirations are clear and unlabored.
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
## 2019-05-18 NOTE — ED Notes (Signed)
Myrlene Broker MD reviewed discharge instructions with the patient.  The patient verbalized understanding.  All questions and concerns were addressed.  The patient declined a wheelchair and is discharged ambulatory in the care of family members with instructions and prescriptions in hand.  Pt is alert and oriented x 4.  Respirations are clear and unlabored.
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
## 2019-05-19 LAB — CBC WITH AUTOMATED DIFF
ABS. BASOPHILS: 0 10*3/uL (ref 0.0–0.1)
ABS. EOSINOPHILS: 0 10*3/uL (ref 0.0–0.4)
ABS. IMM. GRANS.: 0 10*3/uL (ref 0.00–0.04)
ABS. LYMPHOCYTES: 0.5 10*3/uL — ABNORMAL LOW (ref 0.8–3.5)
ABS. MONOCYTES: 0.7 10*3/uL (ref 0.0–1.0)
ABS. NEUTROPHILS: 6.4 10*3/uL (ref 1.8–8.0)
ABSOLUTE NRBC: 0 10*3/uL (ref 0.00–0.01)
BASOPHILS: 0 % (ref 0–1)
EOSINOPHILS: 0 % (ref 0–7)
HCT: 35.1 % — ABNORMAL LOW (ref 36.6–50.3)
HGB: 11.6 g/dL — ABNORMAL LOW (ref 12.1–17.0)
IMMATURE GRANULOCYTES: 0 % (ref 0.0–0.5)
LYMPHOCYTES: 7 % — ABNORMAL LOW (ref 12–49)
MCH: 28.2 PG (ref 26.0–34.0)
MCHC: 33 g/dL (ref 30.0–36.5)
MCV: 85.2 FL (ref 80.0–99.0)
MONOCYTES: 9 % (ref 5–13)
MPV: 10.6 FL (ref 8.9–12.9)
NEUTROPHILS: 84 % — ABNORMAL HIGH (ref 32–75)
NRBC: 0 PER 100 WBC
PLATELET: 251 10*3/uL (ref 150–400)
RBC: 4.12 M/uL (ref 4.10–5.70)
RDW: 15.1 % — ABNORMAL HIGH (ref 11.5–14.5)
WBC: 7.6 10*3/uL (ref 4.1–11.1)

## 2019-05-19 LAB — EKG, 12 LEAD, INITIAL
Atrial Rate: 60 {beats}/min
Calculated R Axis: -89 degrees
Calculated T Axis: 99 degrees
P-R Interval: 162 ms
Q-T Interval: 550 ms
QRS Duration: 148 ms
QTC Calculation (Bezet): 550 ms
Ventricular Rate: 60 {beats}/min

## 2019-05-19 LAB — METABOLIC PANEL, COMPREHENSIVE
A-G Ratio: 0.6 — ABNORMAL LOW (ref 1.1–2.2)
ALT (SGPT): 19 U/L (ref 12–78)
AST (SGOT): 26 U/L (ref 15–37)
Albumin: 3.5 g/dL (ref 3.5–5.0)
Alk. phosphatase: 95 U/L (ref 45–117)
Anion gap: 7 mmol/L (ref 5–15)
BUN/Creatinine ratio: 12 (ref 12–20)
BUN: 19 MG/DL (ref 6–20)
Bilirubin, total: 0.5 MG/DL (ref 0.2–1.0)
CO2: 24 mmol/L (ref 21–32)
Calcium: 8.9 MG/DL (ref 8.5–10.1)
Chloride: 107 mmol/L (ref 97–108)
Creatinine: 1.61 MG/DL — ABNORMAL HIGH (ref 0.70–1.30)
GFR est AA: 50 mL/min/{1.73_m2} — ABNORMAL LOW (ref >60)
GFR est non-AA: 41 mL/min/{1.73_m2} — ABNORMAL LOW (ref >60)
Globulin: 5.5 g/dL — ABNORMAL HIGH (ref 2.0–4.0)
Glucose: 92 mg/dL (ref 65–100)
Potassium: 4.3 mmol/L (ref 3.5–5.1)
Protein, total: 9 g/dL — ABNORMAL HIGH (ref 6.4–8.2)
Sodium: 138 mmol/L (ref 136–145)

## 2019-05-19 LAB — POC LACTIC ACID: Lactic Acid (POC): 0.89 mmol/L (ref 0.40–2.00)

## 2019-05-19 LAB — TROPONIN I: Troponin-I, Qt.: <0.05 ng/mL (ref <0.05)

## 2019-05-19 LAB — COVID-19 RAPID TEST: COVID-19 rapid test: DETECTED — CR

## 2019-05-19 LAB — CBC WITH AUTO DIFFERENTIAL
Basophils %: 0 % (ref 0–1)
Basophils Absolute: 0 10*3/uL (ref 0.0–0.1)
Eosinophils %: 0 % (ref 0–7)
Eosinophils Absolute: 0 10*3/uL (ref 0.0–0.4)
Granulocyte Absolute Count: 0 10*3/uL (ref 0.00–0.04)
Hematocrit: 35.1 % — ABNORMAL LOW (ref 36.6–50.3)
Hemoglobin: 11.6 g/dL — ABNORMAL LOW (ref 12.1–17.0)
Immature Granulocytes: 0 % (ref 0.0–0.5)
Lymphocytes %: 7 % — ABNORMAL LOW (ref 12–49)
Lymphocytes Absolute: 0.5 10*3/uL — ABNORMAL LOW (ref 0.8–3.5)
MCH: 28.2 PG (ref 26.0–34.0)
MCHC: 33 g/dL (ref 30.0–36.5)
MCV: 85.2 FL (ref 80.0–99.0)
MPV: 10.6 FL (ref 8.9–12.9)
Monocytes %: 9 % (ref 5–13)
Monocytes Absolute: 0.7 10*3/uL (ref 0.0–1.0)
NRBC Absolute: 0 10*3/uL (ref 0.00–0.01)
Neutrophils %: 84 % — ABNORMAL HIGH (ref 32–75)
Neutrophils Absolute: 6.4 10*3/uL (ref 1.8–8.0)
Nucleated RBCs: 0 PER 100 WBC
Platelets: 251 10*3/uL (ref 150–400)
RBC: 4.12 M/uL (ref 4.10–5.70)
RDW: 15.1 % — ABNORMAL HIGH (ref 11.5–14.5)
WBC: 7.6 10*3/uL (ref 4.1–11.1)

## 2019-05-19 LAB — COMPREHENSIVE METABOLIC PANEL
ALT: 19 U/L (ref 12–78)
AST: 26 U/L (ref 15–37)
Albumin/Globulin Ratio: 0.6 — ABNORMAL LOW (ref 1.1–2.2)
Albumin: 3.5 g/dL (ref 3.5–5.0)
Alkaline Phosphatase: 95 U/L (ref 45–117)
Anion Gap: 7 mmol/L (ref 5–15)
BUN: 19 MG/DL (ref 6–20)
Bun/Cre Ratio: 12 (ref 12–20)
CO2: 24 mmol/L (ref 21–32)
Calcium: 8.9 MG/DL (ref 8.5–10.1)
Chloride: 107 mmol/L (ref 97–108)
Creatinine: 1.61 MG/DL — ABNORMAL HIGH (ref 0.70–1.30)
EGFR IF NonAfrican American: 41 mL/min/{1.73_m2} — ABNORMAL LOW (ref >60)
GFR African American: 50 mL/min/{1.73_m2} — ABNORMAL LOW (ref >60)
Globulin: 5.5 g/dL — ABNORMAL HIGH (ref 2.0–4.0)
Glucose: 92 mg/dL (ref 65–100)
Potassium: 4.3 mmol/L (ref 3.5–5.1)
Sodium: 138 mmol/L (ref 136–145)
Total Bilirubin: 0.5 MG/DL (ref 0.2–1.0)
Total Protein: 9 g/dL — ABNORMAL HIGH (ref 6.4–8.2)

## 2019-05-19 LAB — EKG 12-LEAD
Atrial Rate: 60 {beats}/min
P-R Interval: 162 ms
Q-T Interval: 550 ms
QRS Duration: 148 ms
QTc Calculation (Bazett): 550 ms
R Axis: -89 degrees
T Axis: 99 degrees
Ventricular Rate: 60 {beats}/min

## 2019-05-19 LAB — POCT LACTIC ACID: POC Lactic Acid: 0.89 mmol/L (ref 0.40–2.00)

## 2019-05-19 LAB — COVID-19, RAPID: SARS-CoV-2, Rapid: DETECTED — CR

## 2019-05-19 LAB — TROPONIN: Troponin I: <0.05 ng/mL (ref <0.05)

## 2019-05-19 MED ORDER — ACETAMINOPHEN 325 MG TABLET
325 mg | Freq: Four times a day (QID) | ORAL | Status: DC | PRN
Start: 2019-05-19 — End: 2019-05-19

## 2019-05-19 MED ORDER — TRAMADOL 50 MG TAB
50 mg | ORAL_TABLET | Freq: Four times a day (QID) | ORAL | 0 refills | Status: AC | PRN
Start: 2019-05-19 — End: 2019-05-21

## 2019-05-19 MED ORDER — KETOROLAC TROMETHAMINE 30 MG/ML INJECTION
30 mg/mL (1 mL) | INTRAMUSCULAR | Status: AC
Start: 2019-05-19 — End: 2019-05-18
  Administered 2019-05-19: 01:00:00 via INTRAVENOUS

## 2019-05-19 MED ORDER — ACETAMINOPHEN 325 MG TABLET
325 mg | ORAL_TABLET | Freq: Four times a day (QID) | ORAL | 0 refills | Status: DC | PRN
Start: 2019-05-19 — End: 2019-08-20

## 2019-05-19 MED FILL — KETOROLAC TROMETHAMINE 30 MG/ML INJECTION: 30 mg/mL (1 mL) | INTRAMUSCULAR | Qty: 1

## 2019-05-19 NOTE — Progress Notes (Signed)
Patient contacted regarding recent discharge and COVID-19 risk. Discussed COVID-19 related testing which was available at this time. Test results were positive. Patient informed of results, if available? yes    Outreach made within 2 business days of discharge: Yes    Care Transition Nurse/ Ambulatory Care Manager/ LPN Care Coordinator contacted the patient by telephone to perform post discharge assessment. Verified name and DOB with patient as identifiers.     Patient has following risk factors of: heart failure and diabetes. CTN/ACM/LPN reviewed discharge instructions, medical action plan and red flags related to discharge diagnosis. Reviewed and educated them on any new and changed medications related to discharge diagnosis.  Advised obtaining a 90-day supply of all daily and as-needed medications.     Advance Care Planning:   Does patient have an Advance Directive: currently not on file; patient declined education    Education provided regarding infection prevention, and signs and symptoms of COVID-19 and when to seek medical attention with patient who verbalized understanding. Discussed exposure protocols and quarantine from [facility] Guidelines ???Are you at higher risk for severe illness 2019??? and given an opportunity for questions and concerns. The patient agrees to contact the COVID-19 hotline [phone] or PCP office for questions related to their healthcare. CTN/ACM/LPN provided contact information for future reference.    From CDC: Are you at higher risk for severe illness?    ??? Wash your hands often.  ??? Avoid close contact (6 feet, which is about two arm lengths) with people who are sick.  ??? Put distance between yourself and other people if COVID-19 is spreading in your community.  ??? Clean and disinfect frequently touched surfaces.  ??? Avoid all cruise travel and non-essential air travel.  ??? Call your healthcare professional if you have concerns about COVID-19 and your underlying condition or if you are sick.     For more information on steps you can take to protect yourself, see CDC's How to Protect Yourself      Patient/family/caregiver given information for GetWell Loop and agrees to enroll no  Patient's preferred e-mail:  n/a  Patient's preferred phone number: n/a  Based on Loop alert triggers, patient will be contacted by nurse care manager for worsening symptoms.    Plan for follow-up call in 7-14 days based on severity of symptoms and risk factors.

## 2019-05-19 NOTE — Progress Notes (Signed)
 Patient contacted regarding recent discharge and COVID-19 risk. Discussed COVID-19 related testing which was available at this time. Test results were positive. Patient informed of results, if available? yes    Outreach made within 2 business days of discharge: Yes    Care Transition Nurse/ Ambulatory Care Manager/ LPN Care Coordinator contacted the patient by telephone to perform post discharge assessment. Verified name and DOB with patient as identifiers.     Patient has following risk factors of: heart failure and diabetes. CTN/ACM/LPN reviewed discharge instructions, medical action plan and red flags related to discharge diagnosis. Reviewed and educated them on any new and changed medications related to discharge diagnosis.  Advised obtaining a 90-day supply of all daily and as-needed medications.     Advance Care Planning:   Does patient have an Advance Directive: currently not on file; patient declined education    Education provided regarding infection prevention, and signs and symptoms of COVID-19 and when to seek medical attention with patient who verbalized understanding. Discussed exposure protocols and quarantine from [facility] Guidelines "Are you at higher risk for severe illness 2019" and given an opportunity for questions and concerns. The patient agrees to contact the COVID-19 hotline [phone] or PCP office for questions related to their healthcare. CTN/ACM/LPN provided contact information for future reference.    From CDC: Are you at higher risk for severe illness?    SABRA Hauser your hands often.  SABRA Avoid close contact (6 feet, which is about two arm lengths) with people who are sick.  . Put distance between yourself and other people if COVID-19 is spreading in your community.  . Clean and disinfect frequently touched surfaces.  . Avoid all cruise travel and non-essential air travel.  . Call your healthcare professional if you have concerns about COVID-19 and your underlying condition or if you are  sick.    For more information on steps you can take to protect yourself, see CDC's How to Protect Yourself      Patient/family/caregiver given information for GetWell Loop and agrees to enroll no  Patient's preferred e-mail:  n/a  Patient's preferred phone number: n/a  Based on Loop alert triggers, patient will be contacted by nurse care manager for worsening symptoms.    Plan for follow-up call in 7-14 days based on severity of symptoms and risk factors.

## 2019-05-20 LAB — CULTURE, WOUND W GRAM STAIN
GRAM STAIN: NONE SEEN
Gram Stain Result: NONE SEEN

## 2019-05-21 MED ORDER — CEFDINIR 300 MG CAP
300 mg | ORAL_CAPSULE | Freq: Two times a day (BID) | ORAL | 0 refills | Status: AC
Start: 2019-05-21 — End: 2019-05-28

## 2019-05-24 LAB — CULTURE, BLOOD
Culture result:: NO GROWTH
Culture result:: NO GROWTH

## 2019-05-24 LAB — CULTURE, BLOOD 1
Culture: NO GROWTH
Culture: NO GROWTH

## 2019-05-28 DIAGNOSIS — E860 Dehydration: Secondary | ICD-10-CM

## 2019-05-28 NOTE — ED Notes (Signed)
Assumed care of pt from K. Root, RN. Pt resting on stretcher in a position of comfort. Call bell within reach. Updated on plan of care. Voices no needs at this time.
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
## 2019-05-28 NOTE — ED Notes (Signed)
Assumed care of pt from K. Root, RN. Pt resting on stretcher in a position of comfort. Call bell within reach. Updated on plan of care. Voices no needs at this time.

## 2019-05-29 ENCOUNTER — Inpatient Hospital Stay
Admit: 2019-05-29 | Discharge: 2019-05-29 | Disposition: A | Discharge status: Home / Self Care | Payer: MEDICARE | Attending: Emergency Medicine

## 2019-05-29 LAB — CBC WITH AUTOMATED DIFF
ABS. BASOPHILS: 0 10*3/uL (ref 0.0–0.1)
ABS. EOSINOPHILS: 0.1 10*3/uL (ref 0.0–0.4)
ABS. IMM. GRANS.: 0 10*3/uL (ref 0.00–0.04)
ABS. LYMPHOCYTES: 0.7 10*3/uL — ABNORMAL LOW (ref 0.8–3.5)
ABS. MONOCYTES: 0.8 10*3/uL (ref 0.0–1.0)
ABS. NEUTROPHILS: 7.7 10*3/uL (ref 1.8–8.0)
ABSOLUTE NRBC: 0 10*3/uL (ref 0.00–0.01)
BASOPHILS: 0 % (ref 0–1)
EOSINOPHILS: 1 % (ref 0–7)
HCT: 43.3 % (ref 36.6–50.3)
HGB: 14.2 g/dL (ref 12.1–17.0)
IMMATURE GRANULOCYTES: 0 % (ref 0.0–0.5)
LYMPHOCYTES: 8 % — ABNORMAL LOW (ref 12–49)
MCH: 27.7 PG (ref 26.0–34.0)
MCHC: 32.8 g/dL (ref 30.0–36.5)
MCV: 84.4 FL (ref 80.0–99.0)
MONOCYTES: 9 % (ref 5–13)
MPV: 11.4 FL (ref 8.9–12.9)
NEUTROPHILS: 82 % — ABNORMAL HIGH (ref 32–75)
NRBC: 0 PER 100 WBC
PLATELET: 429 10*3/uL — ABNORMAL HIGH (ref 150–400)
RBC: 5.13 M/uL (ref 4.10–5.70)
RDW: 15.6 % — ABNORMAL HIGH (ref 11.5–14.5)
WBC: 9.3 10*3/uL (ref 4.1–11.1)

## 2019-05-29 LAB — METABOLIC PANEL, COMPREHENSIVE
A-G Ratio: 0.5 — ABNORMAL LOW (ref 1.1–2.2)
ALT (SGPT): 20 U/L (ref 12–78)
AST (SGOT): 39 U/L — ABNORMAL HIGH (ref 15–37)
Albumin: 3.5 g/dL (ref 3.5–5.0)
Alk. phosphatase: 96 U/L (ref 45–117)
Anion gap: 7 mmol/L (ref 5–15)
BUN/Creatinine ratio: 24 — ABNORMAL HIGH (ref 12–20)
BUN: 42 MG/DL — ABNORMAL HIGH (ref 6–20)
Bilirubin, total: 1.1 MG/DL — ABNORMAL HIGH (ref 0.2–1.0)
CO2: 19 mmol/L — ABNORMAL LOW (ref 21–32)
Calcium: 10.6 MG/DL — ABNORMAL HIGH (ref 8.5–10.1)
Chloride: 120 mmol/L — ABNORMAL HIGH (ref 97–108)
Creatinine: 1.72 MG/DL — ABNORMAL HIGH (ref 0.70–1.30)
GFR est AA: 46 mL/min/{1.73_m2} — ABNORMAL LOW (ref >60)
GFR est non-AA: 38 mL/min/{1.73_m2} — ABNORMAL LOW (ref >60)
Globulin: 6.8 g/dL — ABNORMAL HIGH (ref 2.0–4.0)
Glucose: 104 mg/dL — ABNORMAL HIGH (ref 65–100)
Potassium: 4.5 mmol/L (ref 3.5–5.1)
Protein, total: 10.3 g/dL — ABNORMAL HIGH (ref 6.4–8.2)
Sodium: 146 mmol/L — ABNORMAL HIGH (ref 136–145)

## 2019-05-29 LAB — COMPREHENSIVE METABOLIC PANEL
ALT: 20 U/L (ref 12–78)
AST: 39 U/L — ABNORMAL HIGH (ref 15–37)
Albumin/Globulin Ratio: 0.5 — ABNORMAL LOW (ref 1.1–2.2)
Albumin: 3.5 g/dL (ref 3.5–5.0)
Alkaline Phosphatase: 96 U/L (ref 45–117)
Anion Gap: 7 mmol/L (ref 5–15)
BUN: 42 MG/DL — ABNORMAL HIGH (ref 6–20)
Bun/Cre Ratio: 24 — ABNORMAL HIGH (ref 12–20)
CO2: 19 mmol/L — ABNORMAL LOW (ref 21–32)
Calcium: 10.6 MG/DL — ABNORMAL HIGH (ref 8.5–10.1)
Chloride: 120 mmol/L — ABNORMAL HIGH (ref 97–108)
Creatinine: 1.72 MG/DL — ABNORMAL HIGH (ref 0.70–1.30)
EGFR IF NonAfrican American: 38 mL/min/{1.73_m2} — ABNORMAL LOW (ref >60)
GFR African American: 46 mL/min/{1.73_m2} — ABNORMAL LOW (ref >60)
Globulin: 6.8 g/dL — ABNORMAL HIGH (ref 2.0–4.0)
Glucose: 104 mg/dL — ABNORMAL HIGH (ref 65–100)
Potassium: 4.5 mmol/L (ref 3.5–5.1)
Sodium: 146 mmol/L — ABNORMAL HIGH (ref 136–145)
Total Bilirubin: 1.1 MG/DL — ABNORMAL HIGH (ref 0.2–1.0)
Total Protein: 10.3 g/dL — ABNORMAL HIGH (ref 6.4–8.2)

## 2019-05-29 LAB — CBC WITH AUTO DIFFERENTIAL
Basophils %: 0 % (ref 0–1)
Basophils Absolute: 0 10*3/uL (ref 0.0–0.1)
Eosinophils %: 1 % (ref 0–7)
Eosinophils Absolute: 0.1 10*3/uL (ref 0.0–0.4)
Granulocyte Absolute Count: 0 10*3/uL (ref 0.00–0.04)
Hematocrit: 43.3 % (ref 36.6–50.3)
Hemoglobin: 14.2 g/dL (ref 12.1–17.0)
Immature Granulocytes: 0 % (ref 0.0–0.5)
Lymphocytes %: 8 % — ABNORMAL LOW (ref 12–49)
Lymphocytes Absolute: 0.7 10*3/uL — ABNORMAL LOW (ref 0.8–3.5)
MCH: 27.7 PG (ref 26.0–34.0)
MCHC: 32.8 g/dL (ref 30.0–36.5)
MCV: 84.4 FL (ref 80.0–99.0)
MPV: 11.4 FL (ref 8.9–12.9)
Monocytes %: 9 % (ref 5–13)
Monocytes Absolute: 0.8 10*3/uL (ref 0.0–1.0)
NRBC Absolute: 0 10*3/uL (ref 0.00–0.01)
Neutrophils %: 82 % — ABNORMAL HIGH (ref 32–75)
Neutrophils Absolute: 7.7 10*3/uL (ref 1.8–8.0)
Nucleated RBCs: 0 PER 100 WBC
Platelets: 429 10*3/uL — ABNORMAL HIGH (ref 150–400)
RBC: 5.13 M/uL (ref 4.10–5.70)
RDW: 15.6 % — ABNORMAL HIGH (ref 11.5–14.5)
WBC: 9.3 10*3/uL (ref 4.1–11.1)

## 2019-05-29 MED ORDER — OXYCODONE 5 MG TAB
5 mg | ORAL | Status: AC
Start: 2019-05-29 — End: 2019-05-28
  Administered 2019-05-29: 02:00:00 via ORAL

## 2019-05-29 MED ORDER — SODIUM CHLORIDE 0.9% BOLUS IV
0.9 % | Freq: Once | INTRAVENOUS | Status: AC
Start: 2019-05-29 — End: 2019-05-28
  Administered 2019-05-29: 02:00:00 via INTRAVENOUS

## 2019-05-29 MED FILL — SODIUM CHLORIDE 0.9 % IV: INTRAVENOUS | Qty: 1000

## 2019-05-29 MED FILL — OXYCODONE 5 MG TAB: 5 mg | ORAL | Qty: 1

## 2019-05-29 NOTE — ED Notes (Signed)
Pt given discharge instructions. Saline lock removed. Pt discharged via wheelchair. Pt to ED lobby to await ride. Spoke with granddaughter, Aaisha via telephone. She will coordinate discharge transportation.

## 2019-05-29 NOTE — ED Notes (Signed)
Pt given discharge instructions. Saline lock removed. Pt discharged via wheelchair. Pt to ED lobby to await ride. Spoke with granddaughter, Bernadene Bell via telephone. She will coordinate discharge transportation.

## 2019-05-30 NOTE — Progress Notes (Signed)
ED f/u 05/29/19 COVID LM f/u 1d

## 2019-05-30 NOTE — Progress Notes (Signed)
ED f/u 05/29/19 COVID LM f/u 1d

## 2019-05-31 NOTE — Progress Notes (Signed)
Second attempt to contact patient  LM  Episode resolved

## 2019-05-31 NOTE — Progress Notes (Signed)
Second attempt to contact patient  LM  Episode resolved

## 2019-06-02 ENCOUNTER — Inpatient Hospital Stay
Admit: 2019-06-02 | Discharge: 2019-06-18 | Disposition: A | Discharge status: Skilled Nursing Facility | Payer: MEDICARE | Attending: General Acute Care Hospital | Admitting: General Acute Care Hospital

## 2019-06-02 ENCOUNTER — Inpatient Hospital Stay: Admit: 2019-06-02 | Payer: MEDICARE | Primary: Adult Health

## 2019-06-02 ENCOUNTER — Emergency Department: Admit: 2019-06-02 | Payer: MEDICARE | Primary: Adult Health

## 2019-06-02 ENCOUNTER — Inpatient Hospital Stay: Payer: MEDICARE | Primary: Adult Health

## 2019-06-02 DIAGNOSIS — N179 Acute kidney failure, unspecified: Secondary | ICD-10-CM

## 2019-06-02 LAB — URINALYSIS W/ RFLX MICROSCOPIC
BACTERIA, URINE: NEGATIVE /hpf
Bacteria: NEGATIVE /hpf
Blood, Urine: NEGATIVE
Blood: NEGATIVE
Glucose, Ur: NEGATIVE mg/dL
Glucose: NEGATIVE mg/dL
Nitrite, Urine: POSITIVE — AB
Nitrites: POSITIVE — AB
Protein, UA: NEGATIVE mg/dL
Protein: NEGATIVE mg/dL
Specific Gravity, UA: 1.023 (ref 1.003–1.030)
Specific gravity: 1.023 (ref 1.003–1.030)
Urobilinogen, UA, POCT: 0.2 EU/dL (ref 0.2–1.0)
Urobilinogen: 0.2 EU/dL (ref 0.2–1.0)
pH (UA): 5 (ref 5.0–8.0)
pH, UA: 5 (ref 5.0–8.0)

## 2019-06-02 LAB — METABOLIC PANEL, COMPREHENSIVE
A-G Ratio: 0.5 — ABNORMAL LOW (ref 1.1–2.2)
ALT (SGPT): 26 U/L (ref 12–78)
AST (SGOT): 75 U/L — ABNORMAL HIGH (ref 15–37)
Albumin: 3.7 g/dL (ref 3.5–5.0)
Alk. phosphatase: 116 U/L (ref 45–117)
Anion gap: 15 mmol/L (ref 5–15)
BUN/Creatinine ratio: 13 (ref 12–20)
BUN: 102 MG/DL — ABNORMAL HIGH (ref 6–20)
Bilirubin, total: 1.7 MG/DL — ABNORMAL HIGH (ref 0.2–1.0)
CO2: 12 mmol/L — CL (ref 21–32)
Calcium: 10.8 MG/DL — ABNORMAL HIGH (ref 8.5–10.1)
Chloride: 135 mmol/L — ABNORMAL HIGH (ref 97–108)
Creatinine: 7.67 MG/DL — ABNORMAL HIGH (ref 0.70–1.30)
GFR est AA: 8 mL/min/{1.73_m2} — ABNORMAL LOW (ref >60)
GFR est non-AA: 7 mL/min/{1.73_m2} — ABNORMAL LOW (ref >60)
Globulin: 6.9 g/dL — ABNORMAL HIGH (ref 2.0–4.0)
Glucose: 138 mg/dL — ABNORMAL HIGH (ref 65–100)
Potassium: 5 mmol/L (ref 3.5–5.1)
Protein, total: 10.6 g/dL — ABNORMAL HIGH (ref 6.4–8.2)
Sodium: 162 mmol/L — CR (ref 136–145)

## 2019-06-02 LAB — CBC WITH AUTOMATED DIFF
ABS. BASOPHILS: 0.1 10*3/uL (ref 0.0–0.1)
ABS. BASOPHILS: 0 10*3/uL (ref 0.0–0.1)
ABS. EOSINOPHILS: 0 10*3/uL (ref 0.0–0.4)
ABS. EOSINOPHILS: 0 10*3/uL (ref 0.0–0.4)
ABS. IMM. GRANS.: 0.1 10*3/uL — ABNORMAL HIGH (ref 0.00–0.04)
ABS. IMM. GRANS.: 0.2 10*3/uL — ABNORMAL HIGH (ref 0.00–0.04)
ABS. LYMPHOCYTES: 1.1 10*3/uL (ref 0.8–3.5)
ABS. LYMPHOCYTES: 1.4 10*3/uL (ref 0.8–3.5)
ABS. MONOCYTES: 0.9 10*3/uL (ref 0.0–1.0)
ABS. MONOCYTES: 1.4 10*3/uL — ABNORMAL HIGH (ref 0.0–1.0)
ABS. NEUTROPHILS: 11.3 10*3/uL — ABNORMAL HIGH (ref 1.8–8.0)
ABS. NEUTROPHILS: 12.1 10*3/uL — ABNORMAL HIGH (ref 1.8–8.0)
ABSOLUTE NRBC: 0.03 10*3/uL — ABNORMAL HIGH (ref 0.00–0.01)
ABSOLUTE NRBC: 0.04 10*3/uL — ABNORMAL HIGH (ref 0.00–0.01)
BASOPHILS: 0 % (ref 0–1)
BASOPHILS: 0 % (ref 0–1)
EOSINOPHILS: 0 % (ref 0–7)
EOSINOPHILS: 0 % (ref 0–7)
HCT: 46.8 % (ref 36.6–50.3)
HCT: 35.6 % — ABNORMAL LOW (ref 36.6–50.3)
HGB: 13.7 g/dL (ref 12.1–17.0)
HGB: 11 g/dL — ABNORMAL LOW (ref 12.1–17.0)
IMMATURE GRANULOCYTES: 1 % — ABNORMAL HIGH (ref 0.0–0.5)
IMMATURE GRANULOCYTES: 1 % — ABNORMAL HIGH (ref 0.0–0.5)
LYMPHOCYTES: 8 % — ABNORMAL LOW (ref 12–49)
LYMPHOCYTES: 9 % — ABNORMAL LOW (ref 12–49)
MCH: 27.6 PG (ref 26.0–34.0)
MCH: 28.4 PG (ref 26.0–34.0)
MCHC: 29.3 g/dL — ABNORMAL LOW (ref 30.0–36.5)
MCHC: 30.9 g/dL (ref 30.0–36.5)
MCV: 94.2 FL (ref 80.0–99.0)
MCV: 91.8 FL (ref 80.0–99.0)
MONOCYTES: 6 % (ref 5–13)
MONOCYTES: 9 % (ref 5–13)
MPV: 12.1 FL (ref 8.9–12.9)
MPV: 11.6 FL (ref 8.9–12.9)
NEUTROPHILS: 85 % — ABNORMAL HIGH (ref 32–75)
NEUTROPHILS: 80 % — ABNORMAL HIGH (ref 32–75)
NRBC: 0.2 PER 100 WBC — ABNORMAL HIGH
NRBC: 0.3 PER 100 WBC — ABNORMAL HIGH
PLATELET: 297 10*3/uL (ref 150–400)
PLATELET: 317 10*3/uL (ref 150–400)
RBC: 4.97 M/uL (ref 4.10–5.70)
RBC: 3.88 M/uL — ABNORMAL LOW (ref 4.10–5.70)
RDW: 18.4 % — ABNORMAL HIGH (ref 11.5–14.5)
RDW: 16.8 % — ABNORMAL HIGH (ref 11.5–14.5)
WBC: 13.4 10*3/uL — ABNORMAL HIGH (ref 4.1–11.1)
WBC: 15 10*3/uL — ABNORMAL HIGH (ref 4.1–11.1)

## 2019-06-02 LAB — BILIRUBIN, CONFIRM: Bilirubin UA, confirm: POSITIVE — AB

## 2019-06-02 LAB — TYPE & SCREEN
ABO/Rh(D): B NEG
Antibody screen: NEGATIVE

## 2019-06-02 LAB — SAMPLES BEING HELD

## 2019-06-02 LAB — SODIUM, UR, RANDOM
Sodium,Ur: 14 MMOL/L
Sodium,urine random: 14 MMOL/L

## 2019-06-02 LAB — LACTIC ACID
Lactic Acid: 3.1 MMOL/L (ref 0.4–2.0)
Lactic Acid: 4.1 MMOL/L (ref 0.4–2.0)
Lactic acid: 3.1 MMOL/L — CR (ref 0.4–2.0)
Lactic acid: 4.1 MMOL/L — CR (ref 0.4–2.0)

## 2019-06-02 LAB — ETHYL ALCOHOL
ALCOHOL(ETHYL),SERUM: <10 MG/DL (ref <10)
Ethyl Alcohol: <10 MG/DL (ref <10)

## 2019-06-02 LAB — AMMONIA
Ammonia, plasma: 30 umol/L (ref <32)
Ammonia: 30 umol/L (ref <32)

## 2019-06-02 LAB — CK
CK: 1862 U/L — ABNORMAL HIGH (ref 39–308)
Total CK: 1862 U/L — ABNORMAL HIGH (ref 39–308)

## 2019-06-02 LAB — COMPREHENSIVE METABOLIC PANEL
ALT: 26 U/L (ref 12–78)
AST: 75 U/L — ABNORMAL HIGH (ref 15–37)
Albumin/Globulin Ratio: 0.5 — ABNORMAL LOW (ref 1.1–2.2)
Albumin: 3.7 g/dL (ref 3.5–5.0)
Alkaline Phosphatase: 116 U/L (ref 45–117)
Anion Gap: 15 mmol/L (ref 5–15)
BUN: 102 MG/DL — ABNORMAL HIGH (ref 6–20)
Bun/Cre Ratio: 13 (ref 12–20)
CO2: 12 mmol/L — CL (ref 21–32)
Calcium: 10.8 MG/DL — ABNORMAL HIGH (ref 8.5–10.1)
Chloride: 135 mmol/L — ABNORMAL HIGH (ref 97–108)
Creatinine: 7.67 MG/DL — ABNORMAL HIGH (ref 0.70–1.30)
EGFR IF NonAfrican American: 7 mL/min/{1.73_m2} — ABNORMAL LOW (ref >60)
GFR African American: 8 mL/min/{1.73_m2} — ABNORMAL LOW (ref >60)
Globulin: 6.9 g/dL — ABNORMAL HIGH (ref 2.0–4.0)
Glucose: 138 mg/dL — ABNORMAL HIGH (ref 65–100)
Potassium: 5 mmol/L (ref 3.5–5.1)
Sodium: 162 mmol/L (ref 136–145)
Total Bilirubin: 1.7 MG/DL — ABNORMAL HIGH (ref 0.2–1.0)
Total Protein: 10.6 g/dL — ABNORMAL HIGH (ref 6.4–8.2)

## 2019-06-02 LAB — CBC WITH AUTO DIFFERENTIAL
Basophils %: 0 % (ref 0–1)
Basophils %: 0 % (ref 0–1)
Basophils Absolute: 0.1 10*3/uL (ref 0.0–0.1)
Basophils Absolute: 0 10*3/uL (ref 0.0–0.1)
Eosinophils %: 0 % (ref 0–7)
Eosinophils %: 0 % (ref 0–7)
Eosinophils Absolute: 0 10*3/uL (ref 0.0–0.4)
Eosinophils Absolute: 0 10*3/uL (ref 0.0–0.4)
Granulocyte Absolute Count: 0.1 10*3/uL — ABNORMAL HIGH (ref 0.00–0.04)
Granulocyte Absolute Count: 0.2 10*3/uL — ABNORMAL HIGH (ref 0.00–0.04)
Hematocrit: 46.8 % (ref 36.6–50.3)
Hematocrit: 35.6 % — ABNORMAL LOW (ref 36.6–50.3)
Hemoglobin: 13.7 g/dL (ref 12.1–17.0)
Hemoglobin: 11 g/dL — ABNORMAL LOW (ref 12.1–17.0)
Immature Granulocytes: 1 % — ABNORMAL HIGH (ref 0.0–0.5)
Immature Granulocytes: 1 % — ABNORMAL HIGH (ref 0.0–0.5)
Lymphocytes %: 8 % — ABNORMAL LOW (ref 12–49)
Lymphocytes %: 9 % — ABNORMAL LOW (ref 12–49)
Lymphocytes Absolute: 1.1 10*3/uL (ref 0.8–3.5)
Lymphocytes Absolute: 1.4 10*3/uL (ref 0.8–3.5)
MCH: 27.6 PG (ref 26.0–34.0)
MCH: 28.4 PG (ref 26.0–34.0)
MCHC: 29.3 g/dL — ABNORMAL LOW (ref 30.0–36.5)
MCHC: 30.9 g/dL (ref 30.0–36.5)
MCV: 94.2 FL (ref 80.0–99.0)
MCV: 91.8 FL (ref 80.0–99.0)
MPV: 12.1 FL (ref 8.9–12.9)
MPV: 11.6 FL (ref 8.9–12.9)
Monocytes %: 6 % (ref 5–13)
Monocytes %: 9 % (ref 5–13)
Monocytes Absolute: 0.9 10*3/uL (ref 0.0–1.0)
Monocytes Absolute: 1.4 10*3/uL — ABNORMAL HIGH (ref 0.0–1.0)
NRBC Absolute: 0.03 10*3/uL — ABNORMAL HIGH (ref 0.00–0.01)
NRBC Absolute: 0.04 10*3/uL — ABNORMAL HIGH (ref 0.00–0.01)
Neutrophils %: 85 % — ABNORMAL HIGH (ref 32–75)
Neutrophils %: 80 % — ABNORMAL HIGH (ref 32–75)
Neutrophils Absolute: 11.3 10*3/uL — ABNORMAL HIGH (ref 1.8–8.0)
Neutrophils Absolute: 12.1 10*3/uL — ABNORMAL HIGH (ref 1.8–8.0)
Nucleated RBCs: 0.2 PER 100 WBC — ABNORMAL HIGH
Nucleated RBCs: 0.3 PER 100 WBC — ABNORMAL HIGH
Platelets: 297 10*3/uL (ref 150–400)
Platelets: 317 10*3/uL (ref 150–400)
RBC: 4.97 M/uL (ref 4.10–5.70)
RBC: 3.88 M/uL — ABNORMAL LOW (ref 4.10–5.70)
RDW: 18.4 % — ABNORMAL HIGH (ref 11.5–14.5)
RDW: 16.8 % — ABNORMAL HIGH (ref 11.5–14.5)
WBC: 13.4 10*3/uL — ABNORMAL HIGH (ref 4.1–11.1)
WBC: 15 10*3/uL — ABNORMAL HIGH (ref 4.1–11.1)

## 2019-06-02 LAB — TYPE AND SCREEN
ABO/Rh: B NEG
Antibody Screen: NEGATIVE

## 2019-06-02 LAB — BILIRUBIN, CONFIRMATORY: Bilirubin Urine: POSITIVE — AB

## 2019-06-02 MED ORDER — POLYETHYLENE GLYCOL 3350 17 GRAM (100 %) ORAL POWDER PACKET
17 gram | Freq: Every day | ORAL | Status: DC | PRN
Start: 2019-06-02 — End: 2019-06-18

## 2019-06-02 MED ORDER — PHARMACY VANCOMYCIN NOTE
Status: DC
Start: 2019-06-02 — End: 2019-06-04

## 2019-06-02 MED ORDER — PROMETHAZINE 25 MG TAB
25 mg | Freq: Four times a day (QID) | ORAL | Status: DC | PRN
Start: 2019-06-02 — End: 2019-06-18

## 2019-06-02 MED ORDER — ALBUTEROL SULFATE 0.083 % (0.83 MG/ML) SOLN FOR INHALATION
2.5 mg /3 mL (0.083 %) | RESPIRATORY_TRACT | Status: DC
Start: 2019-06-02 — End: 2019-06-03
  Administered 2019-06-03: 04:00:00 via RESPIRATORY_TRACT

## 2019-06-02 MED ORDER — ACETAMINOPHEN 325 MG TABLET
325 mg | Freq: Four times a day (QID) | ORAL | Status: DC | PRN
Start: 2019-06-02 — End: 2019-06-03
  Administered 2019-06-03: 16:00:00 via ORAL

## 2019-06-02 MED ORDER — CEFEPIME 2 GRAM SOLUTION FOR INJECTION
2 gram | INTRAMUSCULAR | Status: AC
Start: 2019-06-02 — End: 2019-06-02
  Administered 2019-06-02: 19:00:00 via INTRAVENOUS

## 2019-06-02 MED ORDER — SODIUM CHLORIDE 0.9% BOLUS IV
0.9 % | Freq: Once | INTRAVENOUS | Status: AC
Start: 2019-06-02 — End: 2019-06-02
  Administered 2019-06-02: 20:00:00 via INTRAVENOUS

## 2019-06-02 MED ORDER — SODIUM CHLORIDE 0.9 % IJ SYRG
INTRAMUSCULAR | Status: DC | PRN
Start: 2019-06-02 — End: 2019-06-18

## 2019-06-02 MED ORDER — SODIUM CHLORIDE 0.9 % IJ SYRG
Freq: Three times a day (TID) | INTRAMUSCULAR | Status: DC
Start: 2019-06-02 — End: 2019-06-18
  Administered 2019-06-03 – 2019-06-18 (×49): via INTRAVENOUS

## 2019-06-02 MED ORDER — SODIUM CHLORIDE 0.9 % INJECTION
40 mg | Freq: Once | INTRAMUSCULAR | Status: AC
Start: 2019-06-02 — End: 2019-06-02
  Administered 2019-06-02: 21:00:00 via INTRAVENOUS

## 2019-06-02 MED ORDER — ONDANSETRON (PF) 4 MG/2 ML INJECTION
4 mg/2 mL | INTRAMUSCULAR | Status: AC
Start: 2019-06-02 — End: 2019-06-03
  Administered 2019-06-02: 21:00:00

## 2019-06-02 MED ORDER — SODIUM BICARBONATE 8.4 % IV
18.4 mEq/mL (8.4 %) | INTRAVENOUS | Status: DC
Start: 2019-06-02 — End: 2019-06-04
  Administered 2019-06-02 – 2019-06-04 (×7): via INTRAVENOUS

## 2019-06-02 MED ORDER — CEFEPIME 1 GRAM SOLUTION FOR INJECTION
1 gram | INTRAMUSCULAR | Status: DC
Start: 2019-06-02 — End: 2019-06-03
  Administered 2019-06-03: 19:00:00 via INTRAVENOUS

## 2019-06-02 MED ORDER — ACETAMINOPHEN 650 MG RECTAL SUPPOSITORY
650 mg | Freq: Four times a day (QID) | RECTAL | Status: DC | PRN
Start: 2019-06-02 — End: 2019-06-03

## 2019-06-02 MED ORDER — SODIUM CHLORIDE 0.9% BOLUS IV
0.9 % | Freq: Once | INTRAVENOUS | Status: AC
Start: 2019-06-02 — End: 2019-06-02
  Administered 2019-06-02: 18:00:00 via INTRAVENOUS

## 2019-06-02 MED ORDER — ONDANSETRON (PF) 4 MG/2 ML INJECTION
4 mg/2 mL | Freq: Four times a day (QID) | INTRAMUSCULAR | Status: DC | PRN
Start: 2019-06-02 — End: 2019-06-18
  Administered 2019-06-02 – 2019-06-05 (×2): via INTRAVENOUS

## 2019-06-02 MED ORDER — VANCOMYCIN IN 0.9 % SODIUM CHLORIDE 1.75 GRAM/500 ML IV
1.75 gram/500 mL | Freq: Once | INTRAVENOUS | Status: AC
Start: 2019-06-02 — End: 2019-06-02
  Administered 2019-06-02: 20:00:00 via INTRAVENOUS

## 2019-06-02 MED ORDER — ONDANSETRON (PF) 4 MG/2 ML INJECTION
4 mg/2 mL | INTRAMUSCULAR | Status: AC
Start: 2019-06-02 — End: 2019-06-02
  Administered 2019-06-02: 21:00:00 via INTRAVENOUS

## 2019-06-02 MED FILL — ONDANSETRON (PF) 4 MG/2 ML INJECTION: 4 mg/2 mL | INTRAMUSCULAR | Qty: 4

## 2019-06-02 MED FILL — PHARMACY VANCOMYCIN NOTE: Qty: 1

## 2019-06-02 MED FILL — VANCOMYCIN 10 GRAM IV SOLR: 10 gram | INTRAVENOUS | Qty: 1750

## 2019-06-02 MED FILL — SODIUM CHLORIDE 0.9 % IV: INTRAVENOUS | Qty: 1000

## 2019-06-02 MED FILL — PROTONIX 40 MG INTRAVENOUS SOLUTION: 40 mg | INTRAVENOUS | Qty: 80

## 2019-06-02 MED FILL — MONOJECT PREFILL ADVANCED 0.9 % SODIUM CHLORIDE INJECTION SYRINGE: INTRAMUSCULAR | Qty: 40

## 2019-06-02 MED FILL — DEXTROSE 5% IN WATER (D5W) IV: INTRAVENOUS | Qty: 950

## 2019-06-02 MED FILL — CEFEPIME 2 GRAM SOLUTION FOR INJECTION: 2 gram | INTRAMUSCULAR | Qty: 2

## 2019-06-02 MED FILL — ONDANSETRON (PF) 4 MG/2 ML INJECTION: 4 mg/2 mL | INTRAMUSCULAR | Qty: 2

## 2019-06-02 MED FILL — SODIUM CHLORIDE 0.9 % IV: INTRAVENOUS | Qty: 500

## 2019-06-02 MED FILL — POLYETHYLENE GLYCOL 3350 17 GRAM (100 %) ORAL POWDER PACKET: 17 gram | ORAL | Qty: 1
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
## 2019-06-02 NOTE — Progress Notes (Addendum)
TRANSFER - IN REPORT:    Verbal report received from Kaylee, RN(name) on Michael Lozano  being received from ED(unit) for routine progression of care      Report consisted of patient???s Situation, Background, Assessment and   Recommendations(SBAR).     Information from the following report(s) SBAR, Kardex, ED Summary, MAR, Accordion, Recent Results and Cardiac Rhythm Apaced, NSR, PVCs was reviewed with the receiving nurse.    Opportunity for questions and clarification was provided.      Assessment completed upon patient???s arrival to unit and care assumed.     2150: pt settled to unit. Had large BM- soft brown, no signs of blood    2245: LA WDL- no further trending    00005: SARS COV PCR positive    0450: lab results discussed with NP J. Gill. No further orders/ changes to fluids. Trop 0.07, per NP no further trending at this time.     0600: Patient passed swallow eval. Discussed with NP Gill, as well as bloog glucose. Received diet order for patient and q6 bg orders.     0800: Bedside and Verbal shift change report given to dayshift RN (oncoming nurse) by Jenna Graham, RN (offgoing nurse). Report included the following information SBAR, Kardex, Procedure Summary, Intake/Output, MAR, Accordion and Recent Results.

## 2019-06-02 NOTE — ED Triage Notes (Signed)
Patient arrives to ED accompanied by grandson.  Grandson reports patient has had a steady decline in mentation and preforming ADLs since being diagnosed with COVID-19 3 weeks ago.  Grandson also reports "he has neuropathy real bad in both of his feet they don't have any circulation to them"

## 2019-06-02 NOTE — ED Notes (Signed)
Pt in CT. Pt is with RN

## 2019-06-02 NOTE — Progress Notes (Signed)
Day #1 of Cefepime  Indication:  Aspiration PNA  Current regimen:  2g IV q12h  Abx regimen: Cefepime, vancomycin  Recent Labs     06/02/19  1709 06/02/19  1415 06/02/19  1300   WBC 15.0*  --  13.4*   CREA  --  7.67*  --    BUN  --  102*  --      Est CrCl: ~0-5 ml/min; UO: none yet  Temp (24hrs), Avg:95.8 ??F (35.4 ??C), Min:94.5 ??F (34.7 ??C), Max:97 ??F (36.1 ??C)    Cultures: pBlood pending    Plan: Change to 1g IV q24h for CrCl < 11 ml/min per renal dosing protocol.

## 2019-06-02 NOTE — ED Notes (Signed)
While placing the foley catheter pt began to vomit dark coffee ground emesis.  Doctor Sreshta at bedside.   Pt was placed in Bair hugger.  Bladder temp 94.5

## 2019-06-02 NOTE — Progress Notes (Signed)
Admission Medication Reconciliation:    Information obtained from:  After Visit Summary 05/29/19  RxQuery data available??:  NO    Comments/Recommendations: Updated PTA meds/reviewed patient's allergies.    1)  Patient is a poor historian, Rx Query unavailable, used recent after visit summary but it is unclear what medications he is taking    2)  Medication changes (since last review):  Added  - 10 day course of gabapentin and naproxen, 7 day course of cefdinir starting 4/13    Adjusted  - None    Removed  - None     ??RxQuery pharmacy benefit data reflects medications filled and processed through the patient's insurance, however   this data does NOT capture whether the medication was picked up or is currently being taken by the patient.    Allergies:  Hydrocodone, Morphine, and Oxycodone    Significant PMH/Disease States:   Past Medical History:   Diagnosis Date    Chronic pain     diabetic neuropathy    Diabetes mellitus type II, non insulin dependent (HCC)     Other ill-defined conditions(799.89)     Gout    Thromboembolus (HCC)      Chief Complaint for this Admission:    Chief Complaint   Patient presents with    Shortness of Breath    Foot Ulcer    Dehydration     Prior to Admission Medications:   Prior to Admission Medications   Prescriptions Last Dose Informant Taking?   Ramipril 10 mg Tab   No   Sig: Take 10 mg by mouth daily. Indications: HYPERTENSION   acetaminophen (TYLENOL) 325 mg tablet   No   Sig: Take 2 Tabs by mouth every six (6) hours as needed for Pain.   atorvastatin (LIPITOR) 40 mg tablet   No   Sig: Take 40 mg by mouth daily.   cefdinir (OMNICEF) 300 mg capsule   No   Sig: Take 300 mg by mouth two (2) times a day.   gabapentin (NEURONTIN) 300 mg capsule   No   Sig: Take 600 mg by mouth three (3) times daily. Indications: neuropathic pain   naproxen (NAPROSYN) 500 mg tablet   No   Sig: Take 500 mg by mouth two (2) times daily (with meals).   omeprazole (PRILOSEC) 20 mg capsule   No   Sig: Take 20 mg  by mouth daily. Indications: GASTRIC ULCER      Facility-Administered Medications: None     Please contact the main inpatient pharmacy with any questions or concerns at [phone] and we will direct you to the clinical pharmacist covering this patient's care while in-house.   Michael Lozano

## 2019-06-02 NOTE — Progress Notes (Signed)
Pharmacist Note - Vancomycin Dosing    Consult provided for this 84 y.o. male for indication of aspiration PNA.  Antibiotic regimen(s): Vanc + Cefepime  Patient on vancomycin PTA? NO     Recent Labs     06/02/19  1709 06/02/19  1415 06/02/19  1300   WBC 15.0*  --  13.4*   CREA  --  7.67*  --    BUN  --  102*  --      Frequency of BMP: daily x2  Height: 177.8 cm  Weight: 77.6 kg  Est CrCl: 7.7 ml/min; UO: -- ml/kg/hr  Temp (24hrs), Avg:95.8 ??F (35.4 ??C), Min:94.5 ??F (34.7 ??C), Max:97 ??F (36.1 ??C)    Cultures:  4/17 blood - pending    Goal trough = 15 - 20 mcg/mL    Therapy will be initiated with a loading dose of 1750 mg IV x 1 and plan to dose by levels due to AKI.  Pharmacy to follow patient daily and order levels / make dose adjustments as appropriate.

## 2019-06-02 NOTE — H&P (Signed)
[facility]    ICU TEAM History and Physical    Name: Michael Lozano   DOB: [birth year]   MRN: [id number]   Date: 06/02/2019      Assessment:     ICU Problems:  1. AKI on CKD 3  - Place [facility]  - Nephrology consult  - Strict I and O     2. Dehydration  - IVF, sp 2.5 L   - Gentle resuscitation   -HD per renal    3. Hypernatremia   - Serial BMP Q6H   - Na 162, goal for 152 by am     4. Hypothermia  - Matt Holmes hugger for normothermia     5. Ro COVID  - Rapid covid pending  -Recent covid 4/2 +    6. Coffee ground emesis   -Serial CBCs Q6h  -GI consult    7. Leukocytosis   -Cultures pending  -Empiric abx therapy started     8. Lactic acidosis   - Trend LA  - Gentle hydration     9. Metabolic acidosis  -Bicarb drip 266mL/hr per renal       Plan:       Appreciate Consultants Input Nephrology, GI   Discussed Care Plan with Bedside RN  Documentation of Current Medications  Rest of Plan Below:     F - Feeding:  Pending when more awake   A - Analgesia: None  S - Sedation: None  T - DVT Prophylaxis: SCD's or Sequential Compression Device   H - Head of Bed: > 30 Degrees  U - Ulcer Prophylaxis: Not at this time   G - Glycemic Control: Insulin  S - Spontaneous Breathing Trial: N/A  B - Bowel Regimen: None needed at this time  I - Indwelling Catheter:   Tubes: None  Lines: Peripheral IV  Drains: Foley Catheter  D - De-escalation of Antibiotics: Cefepime  Vancomycin    Subjective:   Progress Note: 06/02/2019      Reason for ICU Admission: GIB    HPI:  Michael Lozano is a 85 y.o.  M with PMH of recent COVID, PM,  DM2,  And overall is a very poor historian. Information for HPI is obtained via chart and provider.  Per ER note "He has had a gradual decline, very poor p.o. intake to the point that he has not been eating or drinking for several days. ??He also is somnolent and less interactive. ??He has not fallen or had recurrent fevers. ??No vomiting, diarrhea, dyspnea." He apparently loves with is grandson who is 'never home'. While in ER pt was  found to be hypernatremic and hypotensive, ICU was consulted after coffee ground emesis     Overnight Events:   06/02/2019      POD:  * No surgery found *    S/P:       Active Problem List:     Problem List  Date Reviewed: 08/13/11          Codes Class    Hypernatremia ICD-10-CM: E87.0  ICD-9-CM: 276.0         AKI (acute kidney injury) (HCC) ICD-10-CM: N17.9  ICD-9-CM: 584.9         Cardiomyopathy (HCC) ICD-10-CM: I42.9  ICD-9-CM: 425.4     Overview Signed Aug 13, 2011  7:58 PM by Rodena Medin, MD     6/13 echo lvef 40% with multiple regional wall motion abnormalities.  Biventricular aicd, details unknown.  CAD (coronary artery disease) ICD-10-CM: I25.10  ICD-9-CM: 414.00     Overview Signed 08/09/2011  7:58 PM by Rodena Medin, MD     Multivessel bypass, details unknown                   Past Medical History:      has a past medical history of Chronic pain, Diabetes mellitus type II, non insulin dependent (HCC), Other ill-defined conditions(799.89), and Thromboembolus (HCC).    Past Surgical History:      has a past surgical history that includes hx appendectomy; pr cardiac surg procedure unlist; hx orthopaedic (11/12); and hx pacemaker.    Home Medications:     Prior to Admission medications    Medication Sig Start Date End Date Taking? Authorizing Provider   acetaminophen (TYLENOL) 325 mg tablet Take 2 Tabs by mouth every six (6) hours as needed for Pain. 05/18/19   Royanne Foots, MD   Ramipril 10 mg Tab Take 10 mg by mouth daily. Indications: HYPERTENSION    Provider, Historical   omeprazole (PRILOSEC) 20 mg capsule Take 20 mg by mouth daily. Indications: GASTRIC ULCER    Provider, Historical   atorvastatin (LIPITOR) 40 mg tablet Take 40 mg by mouth daily.    Provider, Historical       Allergies/Social/Family History:     Allergies   Allergen Reactions   ??? Hydrocodone Rash   ??? Morphine Rash   ??? Oxycodone Rash      Social History     Tobacco Use   ??? Smoking status: Unknown If Ever Smoked    Substance Use Topics   ??? Alcohol use: No      History reviewed. No pertinent family history.    Review of Systems:     Review of systems not obtained due to patient factors.    Objective:   Vital Signs:  Visit Vitals  BP (!) 103/39 (BP 1 Location: Right upper arm, BP Patient Position: At rest)   Pulse 72   Temp (!) 94.5 ??F (34.7 ??C)   Resp 20   Ht 5\' 10"  (1.778 m)   Wt 77.6 kg (171 lb)   SpO2 97%   BMI 24.54 kg/m??      O2 Device: None (Room air) Temp (24hrs), Avg:95.8 ??F (35.4 ??C), Min:94.5 ??F (34.7 ??C), Max:97 ??F (36.1 ??C)           Intake/Output:     Intake/Output Summary (Last 24 hours) at 06/02/2019 1800  Last data filed at 06/02/2019 1742  Gross per 24 hour   Intake 1100 ml   Output 300 ml   Net 800 ml       Physical Exam:    General:  Lethargic, looks emaciated    Eyes:  Sclera anicteric. Pupils equally round and reactive to light.   Mouth/Throat: Mucous membranes normal, oral pharynx clear   Neck: Supple   Lungs:   Clear to auscultation bilaterally, good effort   CV:  Regular rate and rhythm,no murmur, click, rub or gallop   Abdomen:   Soft, non-tender. bowel sounds normal. non-distended   Extremities: No cyanosis or edema   Skin: Skin color, texture, turgor normal. no acute rash or lesions   Lymph nodes: Cervical and supraclavicular normal   Musculoskeletal: No swelling or deformity   Lines/Devices:  Intact, no erythema, drainage or tenderness   Neuro- Alert to self and place, overall weak appearing        LABS AND  DATA:  Personally reviewed  Recent Labs     06/02/19  1300   WBC 13.4*   HGB 13.7   HCT 46.8   PLT 297     Recent Labs     06/02/19  1415   NA 162*   K 5.0   CL 135*   CO2 12*   BUN 102*   CREA 7.67*   GLU 138*   CA 10.8*     Recent Labs     06/02/19  1415   AP 116   TP 10.6*   ALB 3.7   GLOB 6.9*     No results for input(s): INR, PTP, APTT, INREXT in the last 72 hours.   No results for input(s): PHI, PCO2I, PO2I, FIO2I in the last 72 hours.  Recent Labs     06/02/19  1415   CPK 1,862*        Hemodynamics:   PAP:   CO:     Wedge:   CI:     CVP:    SVR:       PVR:       Ventilator Settings:  Mode Rate Tidal Volume Pressure FiO2 PEEP                    Peak airway pressure:      Minute ventilation:          MEDS: Reviewed    Chest X-Ray:  CXR Results  (Last 48 hours)               06/02/19 1227  XR CHEST PORT Final result    Impression:  No acute process identified.                Narrative:  Clinical history: Eval for Infiltrate   INDICATION:   Eval for Infiltrate   COMPARISON: 05/18/2019       FINDINGS:   AP portable upright view of the chest demonstrates a enlarged cardiopericardial   silhouette.There is no pleural effusion..There is no focal   consolidation..Cardiac pacer. Clavicle stabilization hardware.. Patient is on a   cardiac monitor.                    Multidisciplinary Rounds Completed:  No    ABCDEF Bundle/Checklist Completed:  Yes    SPECIAL EQUIPMENT  None    DISPOSITION  Stay in ICU    CRITICAL CARE CONSULTANT NOTE  I had a face to face encounter with the patient, reviewed and interpreted patient data including clinical events, labs, images, vital signs, I/O's, and examined patient.  I have discussed the case and the plan and management of the patient's care with the consulting services, the bedside nurses and the respiratory therapist.      NOTE OF PERSONAL INVOLVEMENT IN CARE   This patient has a high probability of imminent, clinically significant deterioration, which requires the highest level of preparedness to intervene urgently. I participated in the decision-making and personally managed or directed the management of the following life and organ supporting interventions that required my frequent assessment to treat or prevent imminent deterioration.    I personally spent 75 minutes of critical care time.  This is time spent at this critically ill patient's bedside actively involved in patient care as well as the coordination of care and discussions with the patient's family.  This  does not include any procedural time which has been billed separately.    Arta Silence, NP  [facility]
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
## 2019-06-02 NOTE — Other (Signed)
TRANSFER - OUT REPORT:    Verbal report given to [place], RN(name) on [employer]  being transferred to [employer]) for routine progression of care       Report consisted of patient???s Situation, Background, Assessment and   Recommendations(SBAR).     Information from the following report(s) SBAR, Kardex, Intake/Output, MAR and Recent Results was reviewed with the receiving nurse.    Lines:   Peripheral IV 06/02/19 Left;Upper Arm (Active)       Peripheral IV 06/02/19 Right Antecubital (Active)        Opportunity for questions and clarification was provided.      Patient transported with:   Monitor  Registered Nurse

## 2019-06-02 NOTE — ED Notes (Signed)
 Patient arrives to ED accompanied by grandson.  Grandson reports patient has had a steady decline in mentation and preforming ADLs since being diagnosed with COVID-19 3 weeks ago.  Grandson also reports he has neuropathy real bad in both of his feet they don't have any circulation to them

## 2019-06-02 NOTE — Progress Notes (Signed)
 TRANSFER - IN REPORT:    Verbal report received from Kaylee, RN(name) on [employer]  being received from ED(unit) for routine progression of care      Report consisted of patient's Situation, Background, Assessment and   Recommendations(SBAR).     Information from the following report(s) SBAR, Kardex, ED Summary, MAR, Accordion, Recent Results and Cardiac Rhythm Apaced, NSR, PVCs was reviewed with the receiving nurse.    Opportunity for questions and clarification was provided.      Assessment completed upon patient's arrival to unit and care assumed.     2150: pt settled to unit. Had large BM- soft brown, no signs of blood    2245: LA WDL- no further trending    00005: SARS COV PCR positive    0450: lab results discussed with NP DOROTHA Kays. No further orders/ changes to fluids. Trop 0.07, per NP no further trending at this time.     0600: Patient passed swallow eval. Discussed with NP Gill, as well as bloog glucose. Received diet order for patient and q6 bg orders.     0800: Bedside and Verbal shift change report given to dayshift RN (oncoming nurse) by Andriette Molly, RN (offgoing nurse). Report included the following information SBAR, Kardex, Procedure Summary, Intake/Output, MAR, Accordion and Recent Results.
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
## 2019-06-02 NOTE — ED Notes (Signed)
While placing the foley catheter pt began to vomit dark coffee ground emesis.  Doctor Lilla Shook at bedside.   Pt was placed in [employer]  Bladder temp 94.5

## 2019-06-02 NOTE — ED Notes (Signed)
Pt in CT. Pt is with RN

## 2019-06-02 NOTE — Progress Notes (Signed)
 Pharmacist Note - Vancomycin Dosing    Consult provided for this 85 y.o. male for indication of aspiration PNA.  Antibiotic regimen(s): Vanc + Cefepime  Patient on vancomycin PTA? NO     Recent Labs     06/02/19  1709 06/02/19  1415 06/02/19  1300   WBC 15.0*  --  13.4*   CREA  --  7.67*  --    BUN  --  102*  --      Frequency of BMP: daily x2  Height: 177.8 cm  Weight: 77.6 kg  Est CrCl: 7.7 ml/min; UO: -- ml/kg/hr  Temp (24hrs), Avg:95.8 F (35.4 C), Min:94.5 F (34.7 C), Max:97 F (36.1 C)    Cultures:  4/17 blood - pending    Goal trough = 15 - 20 mcg/mL    Therapy will be initiated with a loading dose of 1750 mg IV x 1 and plan to dose by levels due to AKI.  Pharmacy to follow patient daily and order levels / make dose adjustments as appropriate.
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
## 2019-06-02 NOTE — Progress Notes (Signed)
 Day #1 of Cefepime  Indication:  Aspiration PNA  Current regimen:  2g IV q12h  Abx regimen: Cefepime, vancomycin  Recent Labs     06/02/19  1709 06/02/19  1415 06/02/19  1300   WBC 15.0*  --  13.4*   CREA  --  7.67*  --    BUN  --  102*  --      Est CrCl: ~0-5 ml/min; UO: none yet  Temp (24hrs), Avg:95.8 F (35.4 C), Min:94.5 F (34.7 C), Max:97 F (36.1 C)    Cultures: pBlood pending    Plan: Change to 1g IV q24h for CrCl < 11 ml/min per renal dosing protocol.

## 2019-06-02 NOTE — Progress Notes (Signed)
 Admission Medication Reconciliation:    Information obtained from:  After Visit Summary 05/29/19  RxQuery data available:  NO    Comments/Recommendations: Updated PTA meds/reviewed patient's allergies.    1)  Patient is a poor historian, Rx Query unavailable, used recent after visit summary but it is unclear what medications he is taking    2)  Medication changes (since last review):  Added  - 10 day course of gabapentin and naproxen, 7 day course of cefdinir starting 4/13    Adjusted  - None    Removed  - None     RxQuery pharmacy benefit data reflects medications filled and processed through the patient's insurance, however   this data does NOT capture whether the medication was picked up or is currently being taken by the patient.    Allergies:  Hydrocodone, Morphine, and Oxycodone    Significant PMH/Disease States:   Past Medical History:   Diagnosis Date    Chronic pain     diabetic neuropathy    Diabetes mellitus type II, non insulin dependent (HCC)     Other ill-defined conditions(799.89)     Gout    Thromboembolus (HCC)      Chief Complaint for this Admission:    Chief Complaint   Patient presents with    Shortness of Breath    Foot Ulcer    Dehydration     Prior to Admission Medications:   Prior to Admission Medications   Prescriptions Last Dose Informant Taking?   Ramipril 10 mg Tab   No   Sig: Take 10 mg by mouth daily. Indications: HYPERTENSION   acetaminophen (TYLENOL) 325 mg tablet   No   Sig: Take 2 Tabs by mouth every six (6) hours as needed for Pain.   atorvastatin (LIPITOR) 40 mg tablet   No   Sig: Take 40 mg by mouth daily.   cefdinir (OMNICEF) 300 mg capsule   No   Sig: Take 300 mg by mouth two (2) times a day.   gabapentin (NEURONTIN) 300 mg capsule   No   Sig: Take 600 mg by mouth three (3) times daily. Indications: neuropathic pain   naproxen (NAPROSYN) 500 mg tablet   No   Sig: Take 500 mg by mouth two (2) times daily (with meals).   omeprazole (PRILOSEC) 20 mg capsule   No   Sig: Take 20 mg  by mouth daily. Indications: GASTRIC ULCER      Facility-Administered Medications: None     Please contact the main inpatient pharmacy with any questions or concerns at [phone] and we will direct you to the clinical pharmacist covering this patient's care while in-house.   Ozell ONEIDA Siad

## 2019-06-02 NOTE — H&P (Signed)
H&P by Arta Silence, NP at 06/02/19 1759                Author: Arta Silence, NP  Service: Nurse Practitioner  Author Type: Nurse Practitioner       Filed: 06/02/19 1904  Date of Service: 06/02/19 1759  Status: Signed           Editor: Arta Silence, NP (Nurse Practitioner)  Cosigner: Lavonia Dana, MD at 06/03/19 [place]      ICU TEAM History and Physical         Name:  Michael Lozano        DOB:  Sep 17, 1934     MRN:  [id number]        Date:  06/02/2019           Assessment:        ICU Problems:   1.  AKI on CKD 3   - Place [facility]   - Nephrology consult   - Strict I and O       2.  Dehydration   - IVF, sp 2.5 L    - Gentle resuscitation    -HD per renal      3.  Hypernatremia    - Serial BMP Q6H    - Na 162, goal for 152 by am       4.  Hypothermia   - [place]ill [place] hugger for normothermia       5.  Ro COVID   - Rapid covid pending   -Recent covid 4/2 +      6.  Coffee ground emesis    -Serial CBCs Q6h   -GI consult      7.  Leukocytosis    -Cultures pending   -Empiric abx therapy started       8.  Lactic acidosis    - Trend LA   - Gentle hydration       9.  Metabolic acidosis   -Bicarb drip 222mL/hr per renal            Plan:           Appreciate Consultants Input Nephrology, GI    Discussed Care Plan with Bedside RN   Documentation of Current Medications   Rest of Plan Below:       F - Feeding:  Pending when more awake    A - Analgesia: None   S - Sedation: None   T - DVT Prophylaxis: SCD's or Sequential Compression Device    H - Head of Bed: > 30 Degrees   U - Ulcer Prophylaxis: Not at this time    G - Glycemic Control: Insulin   S - Spontaneous Breathing Trial: N/A   B - Bowel Regimen: None needed at this time   I - Indwelling Catheter:    Tubes: None   Lines: Peripheral IV   Drains: Foley Catheter   D - De-escalation of Antibiotics: Cefepime   Vancomycin        Subjective:     Progress Note: 06/02/2019        Reason for ICU Admission: GIB      HPI:   Michael Lozano is a 85 y.o.   M with PMH of recent COVID, PM,  DM2,  And overall is a very poor historian. Information for HPI is  obtained via chart and provider.  Per ER note "He  has had a gradual decline, very poor p.o. intake to the point that he has not been eating or drinking for several days. ??He also is somnolent and less interactive. ??He has not fallen or had recurrent fevers. ??No vomiting, diarrhea, dyspnea."  He apparently loves with is grandson who is 'never home'. While in ER pt was found to be hypernatremic and hypotensive, ICU was consulted after coffee ground emesis       Overnight Events:    06/02/2019         POD:   * No surgery found *      S/P:            Active Problem List:           Problem List   Date Reviewed:  08/14/2011                       Codes  Class            Hypernatremia  ICD-10-CM: E87.0   ICD-9-CM: 276.0                        AKI (acute kidney injury) (HCC)  ICD-10-CM: N17.9   ICD-9-CM: 584.9                        Cardiomyopathy (HCC)  ICD-10-CM: I42.9   ICD-9-CM: 425.4            Overview Signed 08-14-2011  7:58 PM by Carmelia Roller III, MD            6/13 echo lvef 40% with multiple regional wall motion abnormalities.   Biventricular aicd, details unknown.                                  CAD (coronary artery disease)  ICD-10-CM: I25.10   ICD-9-CM: 414.00            Overview Signed 08/14/11  7:58 PM by Rodena Medin, MD            Multivessel bypass, details unknown                                        Past Medical History:         has a past medical history of Chronic pain, Diabetes mellitus type II, non insulin dependent (HCC), Other ill-defined conditions(799.89), and Thromboembolus (HCC).        Past Surgical History:         has a past surgical history that includes hx appendectomy; pr cardiac surg procedure unlist; hx orthopaedic (11/12); and hx pacemaker.        Home Medications:          Prior to Admission medications             Medication  Sig  Start Date  End Date  Taking?  Authorizing  Provider            acetaminophen (TYLENOL) 325 mg tablet  Take 2 Tabs by mouth every six (6) hours as needed for Pain.  05/18/19      Royanne Foots, MD     Ramipril 10 mg Tab  Take 10 mg  by mouth daily. Indications: HYPERTENSION        Provider, Historical     omeprazole (PRILOSEC) 20 mg capsule  Take 20 mg by mouth daily. Indications: GASTRIC ULCER        Provider, Historical            atorvastatin (LIPITOR) 40 mg tablet  Take 40 mg by mouth daily.        Provider, Historical             Allergies/Social/Family History:          Allergies        Allergen  Reactions         ?  Hydrocodone  Rash     ?  Morphine  Rash         ?  Oxycodone  Rash           Social History          Tobacco Use         ?  Smoking status:  Unknown If Ever Smoked       Substance Use Topics         ?  Alcohol use:  No         History reviewed. No pertinent family history.        Review of Systems:        Review of systems not obtained due to patient factors.        Objective:     Vital Signs:   Visit Vitals      BP  (!) 103/39 (BP 1 Location: Right upper arm, BP Patient Position: At rest)     Pulse  72     Temp  (!) 94.5 ??F (34.7 ??C)     Resp  20     Ht  5\' 10"  (1.778 m)     Wt  77.6 kg (171 lb)     SpO2  97%        BMI  24.54 kg/m??        O2 Device:  None (Room air) Temp (24hrs), Avg:95.8 ??F (35.4 ??C), Min:94.5 ??F (34.7 ??C), Max:97 ??F (36.1 ??C)               Intake/Output:       Intake/Output Summary (Last 24 hours) at 06/02/2019 1800   Last data filed at 06/02/2019 1742     Gross per 24 hour        Intake  1100 ml        Output  300 ml        Net  800 ml           Physical Exam:         General:   Lethargic, looks emaciated         Eyes:   Sclera anicteric. Pupils equally round and reactive to light.        Mouth/Throat:  Mucous membranes normal, oral pharynx clear        Neck:  Supple     Lungs:    Clear to auscultation bilaterally, good effort     CV:   Regular rate and rhythm,no murmur, click, rub or gallop     Abdomen:    Soft,  non-tender. bowel sounds normal. non-distended     Extremities:  No cyanosis or edema     Skin:  Skin color, texture, turgor normal. no acute rash or lesions     Lymph nodes:  Cervical and supraclavicular normal     Musculoskeletal:  No swelling or deformity     Lines/Devices:   Intact, no erythema, drainage or tenderness     Neuro-  Alert to self and place, overall weak appearing            LABS AND  DATA: Personally reviewed     Recent Labs           06/02/19   1300     WBC  13.4*     HGB  13.7     HCT  46.8        PLT  297          Recent Labs           06/02/19   1415     NA  162*     K  5.0     CL  135*     CO2  12*     BUN  102*     CREA  7.67*     GLU  138*        CA  10.8*          Recent Labs           06/02/19   1415     AP  116     TP  10.6*     ALB  3.7        GLOB  6.9*        No results for input(s): INR, PTP, APTT, INREXT in the last 72 hours.    No results for input(s): PHI, PCO2I, PO2I, FIO2I in the last 72 hours.     Recent Labs           06/02/19   1415        CPK  1,862*           Hemodynamics:         PAP:     CO:        Wedge:     CI:        CVP:      SVR:                 PVR:           Ventilator Settings:          Mode  Rate  Tidal Volume  Pressure  FiO2  PEEP                                        Peak airway pressure:            Minute ventilation:               MEDS: Reviewed      Chest X-Ray:     CXR Results   (Last 48 hours)                                    06/02/19 1227    XR CHEST PORT  Final result            Impression:    No acute process identified.  Narrative:    Clinical history: Eval for Infiltrate      INDICATION:   Eval for Infiltrate      COMPARISON: 05/18/2019             FINDINGS:      AP portable upright view of the chest demonstrates a enlarged cardiopericardial      silhouette.There is no pleural effusion..There is no focal      consolidation..Cardiac pacer. Clavicle stabilization hardware.. Patient is on a      cardiac monitor.                                       Multidisciplinary Rounds Completed:  No      ABCDEF Bundle/Checklist Completed:   Yes      SPECIAL EQUIPMENT   None      DISPOSITION   Stay in ICU      CRITICAL CARE CONSULTANT NOTE   I had a face to face encounter with the patient, reviewed and interpreted patient data including clinical events, labs, images, vital signs, I/O's, and examined patient.  I have discussed the case and  the plan and management of the patient's care with the consulting services, the bedside nurses and the respiratory therapist.        NOTE OF PERSONAL INVOLVEMENT IN CARE    This patient has a high probability of imminent, clinically significant deterioration, which requires the highest level of preparedness to intervene urgently. I participated in the decision-making and  personally managed or directed the management of the following life and organ supporting interventions that required my frequent assessment to treat or prevent imminent deterioration.      I personally spent 75 minutes of critical care time.  This is time spent at this critically ill patient's bedside actively involved in patient care as well as the coordination of care and discussions  with the patient's family.  This does not include any procedural time which has been billed separately.      Nila NephewSara H Atha Mcbain, NP         [employer]

## 2019-06-03 ENCOUNTER — Inpatient Hospital Stay: Admit: 2019-06-03 | Payer: MEDICARE | Primary: Adult Health

## 2019-06-03 LAB — CBC W/O DIFF
ABSOLUTE NRBC: 0.02 10*3/uL — ABNORMAL HIGH (ref 0.00–0.01)
ABSOLUTE NRBC: 0.02 10*3/uL — ABNORMAL HIGH (ref 0.00–0.01)
ABSOLUTE NRBC: 0.02 10*3/uL — ABNORMAL HIGH (ref 0.00–0.01)
ABSOLUTE NRBC: 0.03 10*3/uL — ABNORMAL HIGH (ref 0.00–0.01)
HCT: 34.3 % — ABNORMAL LOW (ref 36.6–50.3)
HCT: 30.7 % — ABNORMAL LOW (ref 36.6–50.3)
HCT: 32.6 % — ABNORMAL LOW (ref 36.6–50.3)
HCT: 31.6 % — ABNORMAL LOW (ref 36.6–50.3)
HGB: 10.7 g/dL — ABNORMAL LOW (ref 12.1–17.0)
HGB: 9.5 g/dL — ABNORMAL LOW (ref 12.1–17.0)
HGB: 10.3 g/dL — ABNORMAL LOW (ref 12.1–17.0)
HGB: 10.1 g/dL — ABNORMAL LOW (ref 12.1–17.0)
MCH: 28.1 PG (ref 26.0–34.0)
MCH: 27.9 PG (ref 26.0–34.0)
MCH: 28.1 PG (ref 26.0–34.0)
MCH: 28 PG (ref 26.0–34.0)
MCHC: 31.2 g/dL (ref 30.0–36.5)
MCHC: 30.9 g/dL (ref 30.0–36.5)
MCHC: 31.6 g/dL (ref 30.0–36.5)
MCHC: 32 g/dL (ref 30.0–36.5)
MCV: 90 FL (ref 80.0–99.0)
MCV: 90.3 FL (ref 80.0–99.0)
MCV: 88.8 FL (ref 80.0–99.0)
MCV: 87.5 FL (ref 80.0–99.0)
MPV: 11.5 FL (ref 8.9–12.9)
MPV: 12 FL (ref 8.9–12.9)
MPV: 11.8 FL (ref 8.9–12.9)
MPV: 11.6 FL (ref 8.9–12.9)
NRBC: 0.1 PER 100 WBC — ABNORMAL HIGH
NRBC: 0.1 PER 100 WBC — ABNORMAL HIGH
NRBC: 0.2 PER 100 WBC — ABNORMAL HIGH
NRBC: 0.2 PER 100 WBC — ABNORMAL HIGH
PLATELET: 294 10*3/uL (ref 150–400)
PLATELET: 290 10*3/uL (ref 150–400)
PLATELET: 262 10*3/uL (ref 150–400)
PLATELET: 250 10*3/uL (ref 150–400)
RBC: 3.81 M/uL — ABNORMAL LOW (ref 4.10–5.70)
RBC: 3.4 M/uL — ABNORMAL LOW (ref 4.10–5.70)
RBC: 3.67 M/uL — ABNORMAL LOW (ref 4.10–5.70)
RBC: 3.61 M/uL — ABNORMAL LOW (ref 4.10–5.70)
RDW: 16.7 % — ABNORMAL HIGH (ref 11.5–14.5)
RDW: 17 % — ABNORMAL HIGH (ref 11.5–14.5)
RDW: 17.1 % — ABNORMAL HIGH (ref 11.5–14.5)
RDW: 16.6 % — ABNORMAL HIGH (ref 11.5–14.5)
WBC: 14.1 10*3/uL — ABNORMAL HIGH (ref 4.1–11.1)
WBC: 14.6 10*3/uL — ABNORMAL HIGH (ref 4.1–11.1)
WBC: 12.8 10*3/uL — ABNORMAL HIGH (ref 4.1–11.1)
WBC: 12.3 10*3/uL — ABNORMAL HIGH (ref 4.1–11.1)

## 2019-06-03 LAB — METABOLIC PANEL, BASIC
Anion gap: 9 mmol/L (ref 5–15)
BUN/Creatinine ratio: 17 (ref 12–20)
BUN: 110 MG/DL — ABNORMAL HIGH (ref 6–20)
CO2: 17 mmol/L — ABNORMAL LOW (ref 21–32)
Calcium: 8.8 MG/DL (ref 8.5–10.1)
Chloride: 137 mmol/L — ABNORMAL HIGH (ref 97–108)
Creatinine: 6.6 MG/DL — ABNORMAL HIGH (ref 0.70–1.30)
GFR est AA: 10 mL/min/{1.73_m2} — ABNORMAL LOW (ref >60)
GFR est non-AA: 8 mL/min/{1.73_m2} — ABNORMAL LOW (ref >60)
Glucose: 130 mg/dL — ABNORMAL HIGH (ref 65–100)
Potassium: 4.9 mmol/L (ref 3.5–5.1)
Sodium: 163 mmol/L — CR (ref 136–145)

## 2019-06-03 LAB — METABOLIC PANEL, COMPREHENSIVE
A-G Ratio: 0.6 — ABNORMAL LOW (ref 1.1–2.2)
A-G Ratio: 0.7 — ABNORMAL LOW (ref 1.1–2.2)
A-G Ratio: 0.6 — ABNORMAL LOW (ref 1.1–2.2)
ALT (SGPT): 22 U/L (ref 12–78)
ALT (SGPT): 20 U/L (ref 12–78)
ALT (SGPT): 20 U/L (ref 12–78)
AST (SGOT): 57 U/L — ABNORMAL HIGH (ref 15–37)
AST (SGOT): 57 U/L — ABNORMAL HIGH (ref 15–37)
AST (SGOT): 60 U/L — ABNORMAL HIGH (ref 15–37)
Albumin: 2.6 g/dL — ABNORMAL LOW (ref 3.5–5.0)
Albumin: 2.6 g/dL — ABNORMAL LOW (ref 3.5–5.0)
Albumin: 2.4 g/dL — ABNORMAL LOW (ref 3.5–5.0)
Alk. phosphatase: 84 U/L (ref 45–117)
Alk. phosphatase: 85 U/L (ref 45–117)
Alk. phosphatase: 74 U/L (ref 45–117)
Anion gap: 11 mmol/L (ref 5–15)
Anion gap: 6 mmol/L (ref 5–15)
Anion gap: 9 mmol/L (ref 5–15)
BUN/Creatinine ratio: 15 (ref 12–20)
BUN/Creatinine ratio: 16 (ref 12–20)
BUN/Creatinine ratio: 15 (ref 12–20)
BUN: 104 MG/DL — ABNORMAL HIGH (ref 6–20)
BUN: 102 MG/DL — ABNORMAL HIGH (ref 6–20)
BUN: 94 MG/DL — ABNORMAL HIGH (ref 6–20)
Bilirubin, total: 1 MG/DL (ref 0.2–1.0)
Bilirubin, total: 1.1 MG/DL — ABNORMAL HIGH (ref 0.2–1.0)
Bilirubin, total: 1 MG/DL (ref 0.2–1.0)
CO2: 16 mmol/L — ABNORMAL LOW (ref 21–32)
CO2: 20 mmol/L — ABNORMAL LOW (ref 21–32)
CO2: 21 mmol/L (ref 21–32)
Calcium: 8.3 MG/DL — ABNORMAL LOW (ref 8.5–10.1)
Calcium: 8 MG/DL — ABNORMAL LOW (ref 8.5–10.1)
Calcium: 8 MG/DL — ABNORMAL LOW (ref 8.5–10.1)
Chloride: 134 mmol/L — ABNORMAL HIGH (ref 97–108)
Chloride: 129 mmol/L — ABNORMAL HIGH (ref 97–108)
Chloride: 128 mmol/L — ABNORMAL HIGH (ref 97–108)
Creatinine: 6.81 MG/DL — ABNORMAL HIGH (ref 0.70–1.30)
Creatinine: 6.56 MG/DL — ABNORMAL HIGH (ref 0.70–1.30)
Creatinine: 6.25 MG/DL — ABNORMAL HIGH (ref 0.70–1.30)
GFR est AA: 9 mL/min/{1.73_m2} — ABNORMAL LOW (ref >60)
GFR est AA: 10 mL/min/{1.73_m2} — ABNORMAL LOW (ref >60)
GFR est AA: 10 mL/min/{1.73_m2} — ABNORMAL LOW (ref >60)
GFR est non-AA: 8 mL/min/{1.73_m2} — ABNORMAL LOW (ref >60)
GFR est non-AA: 8 mL/min/{1.73_m2} — ABNORMAL LOW (ref >60)
GFR est non-AA: 9 mL/min/{1.73_m2} — ABNORMAL LOW (ref >60)
Globulin: 4.1 g/dL — ABNORMAL HIGH (ref 2.0–4.0)
Globulin: 3.8 g/dL (ref 2.0–4.0)
Globulin: 3.9 g/dL (ref 2.0–4.0)
Glucose: 154 mg/dL — ABNORMAL HIGH (ref 65–100)
Glucose: 175 mg/dL — ABNORMAL HIGH (ref 65–100)
Glucose: 133 mg/dL — ABNORMAL HIGH (ref 65–100)
Potassium: 4.5 mmol/L (ref 3.5–5.1)
Potassium: 4.3 mmol/L (ref 3.5–5.1)
Potassium: 3.8 mmol/L (ref 3.5–5.1)
Protein, total: 6.7 g/dL (ref 6.4–8.2)
Protein, total: 6.4 g/dL (ref 6.4–8.2)
Protein, total: 6.3 g/dL — ABNORMAL LOW (ref 6.4–8.2)
Sodium: 161 mmol/L — CR (ref 136–145)
Sodium: 155 mmol/L — ABNORMAL HIGH (ref 136–145)
Sodium: 158 mmol/L — ABNORMAL HIGH (ref 136–145)

## 2019-06-03 LAB — TROPONIN I: Troponin-I, Qt.: 0.07 ng/mL — ABNORMAL HIGH (ref <0.05)

## 2019-06-03 LAB — EKG, 12 LEAD, INITIAL
Atrial Rate: 67 {beats}/min
Atrial Rate: 75 {beats}/min
Calculated P Axis: 0 degrees
Calculated P Axis: 70 degrees
Calculated R Axis: 68 degrees
Calculated R Axis: 83 degrees
Calculated T Axis: -6 degrees
Calculated T Axis: -90 degrees
P-R Interval: 110 ms
P-R Interval: 158 ms
Q-T Interval: 484 ms
Q-T Interval: 498 ms
QRS Duration: 118 ms
QRS Duration: 144 ms
QTC Calculation (Bezet): 526 ms
QTC Calculation (Bezet): 540 ms
Ventricular Rate: 67 {beats}/min
Ventricular Rate: 75 {beats}/min

## 2019-06-03 LAB — GLUCOSE, POC
Glucose (POC): 181 mg/dL — ABNORMAL HIGH (ref 65–100)
Glucose (POC): 158 mg/dL — ABNORMAL HIGH (ref 65–100)
Glucose (POC): 168 mg/dL — ABNORMAL HIGH (ref 65–100)

## 2019-06-03 LAB — RESPIRATORY VIRUS PANEL W/COVID-19, PCR
Adenovirus: NOT DETECTED
Adenovirus: NOT DETECTED
B. parapertussis, PCR: NOT DETECTED
BORDETELLA PARAPERTUSSIS PCR, BORPAR: NOT DETECTED
Bordetella pertussis - PCR: NOT DETECTED
Bordetella pertussis by PCR: NOT DETECTED
CORONAVIRUS 229E: NOT DETECTED
CORONAVIRUS HKU1: NOT DETECTED
CORONAVIRUS NL63: NOT DETECTED
CORONAVIRUS OC43: NOT DETECTED
Chlamydophila pneumoniae DNA, QL, PCR: NOT DETECTED
Chlamydophilia pneumoniae by PCR: NOT DETECTED
Coronavirus 229E: NOT DETECTED
Coronavirus CVNL63: NOT DETECTED
Coronavirus HKU1: NOT DETECTED
Coronavirus OC43: NOT DETECTED
H1N1 by PCR: NOT DETECTED
INFLUENZA A H1: NOT DETECTED
INFLUENZA A H1N1 PCR: NOT DETECTED
INFLUENZA A: NOT DETECTED
INFLUENZA B: NOT DETECTED
Influenza A H3: NOT DETECTED
Influenza A, subtype H1: NOT DETECTED
Influenza A, subtype H3: NOT DETECTED
Influenza A: NOT DETECTED
Influenza B: NOT DETECTED
Metapneumovirus: NOT DETECTED
Metapneumovirus: NOT DETECTED
Mycoplasma pneumoniae DNA, QL, PCR: NOT DETECTED
Mycoplasma pneumoniae by PCR: NOT DETECTED
PARAINFLUENZA 4: NOT DETECTED
Parainfluenza 1: NOT DETECTED
Parainfluenza 1: NOT DETECTED
Parainfluenza 2: NOT DETECTED
Parainfluenza 2: NOT DETECTED
Parainfluenza 3: NOT DETECTED
Parainfluenza 3: NOT DETECTED
Parainfluenza virus 4: NOT DETECTED
RSV by PCR: NOT DETECTED
RSV by PCR: NOT DETECTED
Rhinovirus Enterovirus PCR: NOT DETECTED
Rhinovirus and Enterovirus: NOT DETECTED
SARS Coronavirus-2: POSITIVE — AB
SARS-CoV-2, PCR: POSITIVE — AB

## 2019-06-03 LAB — BLOOD GAS, ARTERIAL
BASE DEFICIT: 10.1 mmol/L
BASE DEFICIT: 8.1 mmol/L
BICARBONATE: 14 mmol/L — ABNORMAL LOW (ref 22–26)
BICARBONATE: 15 mmol/L — ABNORMAL LOW (ref 22–26)
O2 SAT: 97 % (ref 92–97)
O2 SAT: 97 % (ref 92–97)
PCO2: 27 mmHg — ABNORMAL LOW (ref 35–45)
PCO2: 29 mmHg — ABNORMAL LOW (ref 35–45)
PO2: 91 mmHg (ref 80–100)
PO2: 93 mmHg (ref 80–100)
pH: 7.32 — ABNORMAL LOW (ref 7.35–7.45)
pH: 7.37 (ref 7.35–7.45)

## 2019-06-03 LAB — SAMPLES BEING HELD

## 2019-06-03 LAB — MAGNESIUM
Magnesium: 3.4 mg/dL — ABNORMAL HIGH (ref 1.6–2.4)
Magnesium: 3.4 mg/dL — ABNORMAL HIGH (ref 1.6–2.4)
Magnesium: 3.4 mg/dL — ABNORMAL HIGH (ref 1.6–2.4)
Magnesium: 3.4 mg/dL — ABNORMAL HIGH (ref 1.6–2.4)

## 2019-06-03 LAB — CK
CK: 1372 U/L — ABNORMAL HIGH (ref 39–308)
Total CK: 1372 U/L — ABNORMAL HIGH (ref 39–308)

## 2019-06-03 LAB — HEMOGLOBIN A1C WITH EAG
Est. average glucose: 131 mg/dL
Hemoglobin A1c: 6.2 % — ABNORMAL HIGH (ref 4.0–5.6)

## 2019-06-03 LAB — PHOSPHORUS
Phosphorus: 4.6 MG/DL (ref 2.6–4.7)
Phosphorus: 4.6 MG/DL (ref 2.6–4.7)
Phosphorus: 3.7 MG/DL (ref 2.6–4.7)
Phosphorus: 3.7 MG/DL (ref 2.6–4.7)

## 2019-06-03 LAB — PTT: aPTT: 29.6 s (ref 22.1–31.0)

## 2019-06-03 LAB — VANCOMYCIN, RANDOM: Vancomycin, random: 18.7 UG/ML

## 2019-06-03 LAB — LACTIC ACID
Lactic Acid: 1.7 MMOL/L (ref 0.4–2.0)
Lactic acid: 1.7 MMOL/L (ref 0.4–2.0)

## 2019-06-03 LAB — COMPREHENSIVE METABOLIC PANEL
ALT: 22 U/L (ref 12–78)
ALT: 20 U/L (ref 12–78)
ALT: 20 U/L (ref 12–78)
AST: 57 U/L — ABNORMAL HIGH (ref 15–37)
AST: 57 U/L — ABNORMAL HIGH (ref 15–37)
AST: 60 U/L — ABNORMAL HIGH (ref 15–37)
Albumin/Globulin Ratio: 0.6 — ABNORMAL LOW (ref 1.1–2.2)
Albumin/Globulin Ratio: 0.7 — ABNORMAL LOW (ref 1.1–2.2)
Albumin/Globulin Ratio: 0.6 — ABNORMAL LOW (ref 1.1–2.2)
Albumin: 2.6 g/dL — ABNORMAL LOW (ref 3.5–5.0)
Albumin: 2.6 g/dL — ABNORMAL LOW (ref 3.5–5.0)
Albumin: 2.4 g/dL — ABNORMAL LOW (ref 3.5–5.0)
Alkaline Phosphatase: 84 U/L (ref 45–117)
Alkaline Phosphatase: 85 U/L (ref 45–117)
Alkaline Phosphatase: 74 U/L (ref 45–117)
Anion Gap: 11 mmol/L (ref 5–15)
Anion Gap: 6 mmol/L (ref 5–15)
Anion Gap: 9 mmol/L (ref 5–15)
BUN: 104 MG/DL — ABNORMAL HIGH (ref 6–20)
BUN: 102 MG/DL — ABNORMAL HIGH (ref 6–20)
BUN: 94 MG/DL — ABNORMAL HIGH (ref 6–20)
Bun/Cre Ratio: 15 (ref 12–20)
Bun/Cre Ratio: 16 (ref 12–20)
Bun/Cre Ratio: 15 (ref 12–20)
CO2: 16 mmol/L — ABNORMAL LOW (ref 21–32)
CO2: 20 mmol/L — ABNORMAL LOW (ref 21–32)
CO2: 21 mmol/L (ref 21–32)
Calcium: 8.3 MG/DL — ABNORMAL LOW (ref 8.5–10.1)
Calcium: 8 MG/DL — ABNORMAL LOW (ref 8.5–10.1)
Calcium: 8 MG/DL — ABNORMAL LOW (ref 8.5–10.1)
Chloride: 134 mmol/L — ABNORMAL HIGH (ref 97–108)
Chloride: 129 mmol/L — ABNORMAL HIGH (ref 97–108)
Chloride: 128 mmol/L — ABNORMAL HIGH (ref 97–108)
Creatinine: 6.81 MG/DL — ABNORMAL HIGH (ref 0.70–1.30)
Creatinine: 6.56 MG/DL — ABNORMAL HIGH (ref 0.70–1.30)
Creatinine: 6.25 MG/DL — ABNORMAL HIGH (ref 0.70–1.30)
EGFR IF NonAfrican American: 8 mL/min/{1.73_m2} — ABNORMAL LOW (ref >60)
EGFR IF NonAfrican American: 8 mL/min/{1.73_m2} — ABNORMAL LOW (ref >60)
EGFR IF NonAfrican American: 9 mL/min/{1.73_m2} — ABNORMAL LOW (ref >60)
GFR African American: 9 mL/min/{1.73_m2} — ABNORMAL LOW (ref >60)
GFR African American: 10 mL/min/{1.73_m2} — ABNORMAL LOW (ref >60)
GFR African American: 10 mL/min/{1.73_m2} — ABNORMAL LOW (ref >60)
Globulin: 4.1 g/dL — ABNORMAL HIGH (ref 2.0–4.0)
Globulin: 3.8 g/dL (ref 2.0–4.0)
Globulin: 3.9 g/dL (ref 2.0–4.0)
Glucose: 154 mg/dL — ABNORMAL HIGH (ref 65–100)
Glucose: 175 mg/dL — ABNORMAL HIGH (ref 65–100)
Glucose: 133 mg/dL — ABNORMAL HIGH (ref 65–100)
Potassium: 4.5 mmol/L (ref 3.5–5.1)
Potassium: 4.3 mmol/L (ref 3.5–5.1)
Potassium: 3.8 mmol/L (ref 3.5–5.1)
Sodium: 161 mmol/L (ref 136–145)
Sodium: 155 mmol/L — ABNORMAL HIGH (ref 136–145)
Sodium: 158 mmol/L — ABNORMAL HIGH (ref 136–145)
Total Bilirubin: 1 MG/DL (ref 0.2–1.0)
Total Bilirubin: 1.1 MG/DL — ABNORMAL HIGH (ref 0.2–1.0)
Total Bilirubin: 1 MG/DL (ref 0.2–1.0)
Total Protein: 6.7 g/dL (ref 6.4–8.2)
Total Protein: 6.4 g/dL (ref 6.4–8.2)
Total Protein: 6.3 g/dL — ABNORMAL LOW (ref 6.4–8.2)

## 2019-06-03 LAB — CBC
Hematocrit: 34.3 % — ABNORMAL LOW (ref 36.6–50.3)
Hematocrit: 30.7 % — ABNORMAL LOW (ref 36.6–50.3)
Hematocrit: 32.6 % — ABNORMAL LOW (ref 36.6–50.3)
Hematocrit: 31.6 % — ABNORMAL LOW (ref 36.6–50.3)
Hemoglobin: 10.7 g/dL — ABNORMAL LOW (ref 12.1–17.0)
Hemoglobin: 9.5 g/dL — ABNORMAL LOW (ref 12.1–17.0)
Hemoglobin: 10.3 g/dL — ABNORMAL LOW (ref 12.1–17.0)
Hemoglobin: 10.1 g/dL — ABNORMAL LOW (ref 12.1–17.0)
MCH: 28.1 PG (ref 26.0–34.0)
MCH: 27.9 PG (ref 26.0–34.0)
MCH: 28.1 PG (ref 26.0–34.0)
MCH: 28 PG (ref 26.0–34.0)
MCHC: 31.2 g/dL (ref 30.0–36.5)
MCHC: 30.9 g/dL (ref 30.0–36.5)
MCHC: 31.6 g/dL (ref 30.0–36.5)
MCHC: 32 g/dL (ref 30.0–36.5)
MCV: 90 FL (ref 80.0–99.0)
MCV: 90.3 FL (ref 80.0–99.0)
MCV: 88.8 FL (ref 80.0–99.0)
MCV: 87.5 FL (ref 80.0–99.0)
MPV: 11.5 FL (ref 8.9–12.9)
MPV: 12 FL (ref 8.9–12.9)
MPV: 11.8 FL (ref 8.9–12.9)
MPV: 11.6 FL (ref 8.9–12.9)
NRBC Absolute: 0.02 10*3/uL — ABNORMAL HIGH (ref 0.00–0.01)
NRBC Absolute: 0.02 10*3/uL — ABNORMAL HIGH (ref 0.00–0.01)
NRBC Absolute: 0.02 10*3/uL — ABNORMAL HIGH (ref 0.00–0.01)
NRBC Absolute: 0.03 10*3/uL — ABNORMAL HIGH (ref 0.00–0.01)
Nucleated RBCs: 0.1 PER 100 WBC — ABNORMAL HIGH
Nucleated RBCs: 0.1 PER 100 WBC — ABNORMAL HIGH
Nucleated RBCs: 0.2 PER 100 WBC — ABNORMAL HIGH
Nucleated RBCs: 0.2 PER 100 WBC — ABNORMAL HIGH
Platelets: 294 10*3/uL (ref 150–400)
Platelets: 290 10*3/uL (ref 150–400)
Platelets: 262 10*3/uL (ref 150–400)
Platelets: 250 10*3/uL (ref 150–400)
RBC: 3.81 M/uL — ABNORMAL LOW (ref 4.10–5.70)
RBC: 3.4 M/uL — ABNORMAL LOW (ref 4.10–5.70)
RBC: 3.67 M/uL — ABNORMAL LOW (ref 4.10–5.70)
RBC: 3.61 M/uL — ABNORMAL LOW (ref 4.10–5.70)
RDW: 16.7 % — ABNORMAL HIGH (ref 11.5–14.5)
RDW: 17 % — ABNORMAL HIGH (ref 11.5–14.5)
RDW: 17.1 % — ABNORMAL HIGH (ref 11.5–14.5)
RDW: 16.6 % — ABNORMAL HIGH (ref 11.5–14.5)
WBC: 14.1 10*3/uL — ABNORMAL HIGH (ref 4.1–11.1)
WBC: 14.6 10*3/uL — ABNORMAL HIGH (ref 4.1–11.1)
WBC: 12.8 10*3/uL — ABNORMAL HIGH (ref 4.1–11.1)
WBC: 12.3 10*3/uL — ABNORMAL HIGH (ref 4.1–11.1)

## 2019-06-03 LAB — BASIC METABOLIC PANEL
Anion Gap: 9 mmol/L (ref 5–15)
BUN: 110 MG/DL — ABNORMAL HIGH (ref 6–20)
Bun/Cre Ratio: 17 (ref 12–20)
CO2: 17 mmol/L — ABNORMAL LOW (ref 21–32)
Calcium: 8.8 MG/DL (ref 8.5–10.1)
Chloride: 137 mmol/L — ABNORMAL HIGH (ref 97–108)
Creatinine: 6.6 MG/DL — ABNORMAL HIGH (ref 0.70–1.30)
EGFR IF NonAfrican American: 8 mL/min/{1.73_m2} — ABNORMAL LOW (ref >60)
GFR African American: 10 mL/min/{1.73_m2} — ABNORMAL LOW (ref >60)
Glucose: 130 mg/dL — ABNORMAL HIGH (ref 65–100)
Potassium: 4.9 mmol/L (ref 3.5–5.1)
Sodium: 163 mmol/L (ref 136–145)

## 2019-06-03 LAB — EKG 12-LEAD
Atrial Rate: 67 {beats}/min
Atrial Rate: 75 {beats}/min
P Axis: 0 degrees
P Axis: 70 degrees
P-R Interval: 110 ms
P-R Interval: 158 ms
Q-T Interval: 484 ms
Q-T Interval: 498 ms
QRS Duration: 118 ms
QRS Duration: 144 ms
QTc Calculation (Bazett): 526 ms
QTc Calculation (Bazett): 540 ms
R Axis: 68 degrees
R Axis: 83 degrees
T Axis: -6 degrees
T Axis: -90 degrees
Ventricular Rate: 67 {beats}/min
Ventricular Rate: 75 {beats}/min

## 2019-06-03 LAB — ARTERIAL BLOOD GASES
Base Deficit: 10.1 mmol/L
Base Deficit: 8.1 mmol/L
HCO3: 14 mmol/L — ABNORMAL LOW (ref 22–26)
HCO3: 15 mmol/L — ABNORMAL LOW (ref 22–26)
O2 Sat: 97 % (ref 92–97)
O2 Sat: 97 % (ref 92–97)
PCO2: 27 mmHg — ABNORMAL LOW (ref 35–45)
PCO2: 29 mmHg — ABNORMAL LOW (ref 35–45)
PO2: 91 mmHg (ref 80–100)
PO2: 93 mmHg (ref 80–100)
pH: 7.32 — ABNORMAL LOW (ref 7.35–7.45)
pH: 7.37 (ref 7.35–7.45)

## 2019-06-03 LAB — APTT: aPTT: 29.6 s (ref 22.1–31.0)

## 2019-06-03 LAB — POCT GLUCOSE
POC Glucose: 181 mg/dL — ABNORMAL HIGH (ref 65–100)
POC Glucose: 158 mg/dL — ABNORMAL HIGH (ref 65–100)
POC Glucose: 168 mg/dL — ABNORMAL HIGH (ref 65–100)

## 2019-06-03 LAB — HEMOGLOBIN A1C W/EAG
Hemoglobin A1C: 6.2 % — ABNORMAL HIGH (ref 4.0–5.6)
eAG: 131 mg/dL

## 2019-06-03 LAB — TROPONIN: Troponin I: 0.07 ng/mL — ABNORMAL HIGH (ref <0.05)

## 2019-06-03 LAB — VANCOMYCIN LEVEL, RANDOM: Vancomycin Rm: 18.7 UG/ML

## 2019-06-03 MED ORDER — PHARMACY VANCOMYCIN NOTE
Freq: Once | Status: AC
Start: 2019-06-03 — End: 2019-06-03
  Administered 2019-06-03: 19:00:00

## 2019-06-03 MED ORDER — PANTOPRAZOLE 40 MG IV SOLR
40 mg | Freq: Two times a day (BID) | INTRAVENOUS | Status: DC
Start: 2019-06-03 — End: 2019-06-05
  Administered 2019-06-03 – 2019-06-05 (×5): via INTRAVENOUS

## 2019-06-03 MED ORDER — ACETAMINOPHEN 325 MG TABLET
325 mg | Freq: Four times a day (QID) | ORAL | Status: DC | PRN
Start: 2019-06-03 — End: 2019-06-18
  Administered 2019-06-07 – 2019-06-12 (×3): via ORAL

## 2019-06-03 MED ORDER — GLUCAGON 1 MG INJECTION
1 mg | INTRAMUSCULAR | Status: DC | PRN
Start: 2019-06-03 — End: 2019-06-18

## 2019-06-03 MED ORDER — SODIUM CHLORIDE 0.9 % IV PIGGY BACK
1 gram | INTRAVENOUS | Status: DC
Start: 2019-06-03 — End: 2019-06-05
  Administered 2019-06-04: 18:00:00 via INTRAVENOUS

## 2019-06-03 MED ORDER — SODIUM CHLORIDE 0.9 % IV PIGGY BACK
1 gram | INTRAVENOUS | Status: DC
Start: 2019-06-03 — End: 2019-06-03
  Administered 2019-06-03: 22:00:00 via INTRAVENOUS

## 2019-06-03 MED ORDER — FENTANYL CITRATE (PF) 50 MCG/ML IJ SOLN
50 mcg/mL | INTRAMUSCULAR | Status: DC | PRN
Start: 2019-06-03 — End: 2019-06-03
  Administered 2019-06-03 (×2): via INTRAVENOUS

## 2019-06-03 MED ORDER — DEXAMETHASONE SODIUM PHOSPHATE (PF) 10 MG/ML INJECTION
10 mg/mL | INTRAMUSCULAR | Status: DC
Start: 2019-06-03 — End: 2019-06-04
  Administered 2019-06-03: 22:00:00 via INTRAVENOUS

## 2019-06-03 MED ORDER — PHARMACY VANCOMYCIN NOTE
Freq: Once | Status: AC
Start: 2019-06-03 — End: 2019-06-04
  Administered 2019-06-04: 08:00:00

## 2019-06-03 MED ORDER — ALBUTEROL SULFATE HFA 90 MCG/ACTUATION AEROSOL INHALER
90 mcg/actuation | RESPIRATORY_TRACT | Status: DC | PRN
Start: 2019-06-03 — End: 2019-06-18

## 2019-06-03 MED ORDER — ACETAMINOPHEN 650 MG RECTAL SUPPOSITORY
650 mg | Freq: Four times a day (QID) | RECTAL | Status: DC | PRN
Start: 2019-06-03 — End: 2019-06-18

## 2019-06-03 MED ORDER — DEXTROSE 5% IN WATER (D5W) IV
INTRAVENOUS | Status: DC
Start: 2019-06-03 — End: 2019-06-04
  Administered 2019-06-03 – 2019-06-04 (×5): via INTRAVENOUS

## 2019-06-03 MED ORDER — ZINC SULFATE 220 MG (50 MG ELEMENTAL ZINC) CAP
50 mg zinc (220 mg) | Freq: Every day | ORAL | Status: DC
Start: 2019-06-03 — End: 2019-06-05
  Administered 2019-06-04 – 2019-06-05 (×2): via ORAL

## 2019-06-03 MED ORDER — METRONIDAZOLE IN SODIUM CHLORIDE (ISO-OSM) 500 MG/100 ML IV PIGGY BACK
500 mg/100 mL | Freq: Two times a day (BID) | INTRAVENOUS | Status: DC
Start: 2019-06-03 — End: 2019-06-06
  Administered 2019-06-04 – 2019-06-07 (×7): via INTRAVENOUS

## 2019-06-03 MED ORDER — DEXTROSE 50% IN WATER (D50W) IV SYRG
INTRAVENOUS | Status: DC | PRN
Start: 2019-06-03 — End: 2019-06-18

## 2019-06-03 MED ORDER — GLUCOSE 4 GRAM CHEWABLE TAB
4 gram | ORAL | Status: DC | PRN
Start: 2019-06-03 — End: 2019-06-18

## 2019-06-03 MED ORDER — INSULIN LISPRO 100 UNIT/ML INJECTION
100 unit/mL | Freq: Four times a day (QID) | SUBCUTANEOUS | Status: DC
Start: 2019-06-03 — End: 2019-06-05
  Administered 2019-06-03 – 2019-06-05 (×7): via SUBCUTANEOUS

## 2019-06-03 MED ORDER — FENTANYL CITRATE (PF) 50 MCG/ML IJ SOLN
50 mcg/mL | INTRAMUSCULAR | Status: AC | PRN
Start: 2019-06-03 — End: 2019-06-03
  Administered 2019-06-03 (×2): via INTRAVENOUS

## 2019-06-03 MED FILL — DEXTROSE 5% IN WATER (D5W) IV: INTRAVENOUS | Qty: 950

## 2019-06-03 MED FILL — PROTONIX 40 MG INTRAVENOUS SOLUTION: 40 mg | INTRAVENOUS | Qty: 40

## 2019-06-03 MED FILL — DEXTROSE 5% IN WATER (D5W) IV: INTRAVENOUS | Qty: 1000

## 2019-06-03 MED FILL — INSULIN LISPRO 100 UNIT/ML INJECTION: 100 unit/mL | SUBCUTANEOUS | Qty: 1

## 2019-06-03 MED FILL — DEXAMETHASONE SODIUM PHOSPHATE (PF) 10 MG/ML INJECTION: 10 mg/mL | INTRAMUSCULAR | Qty: 1

## 2019-06-03 MED FILL — CEFTRIAXONE 1 GRAM SOLUTION FOR INJECTION: 1 gram | INTRAMUSCULAR | Qty: 1

## 2019-06-03 MED FILL — POLYETHYLENE GLYCOL 3350 17 GRAM (100 %) ORAL POWDER PACKET: 17 gram | ORAL | Qty: 1

## 2019-06-03 MED FILL — PHARMACY VANCOMYCIN NOTE: Qty: 1

## 2019-06-03 MED FILL — FENTANYL CITRATE (PF) 50 MCG/ML IJ SOLN: 50 mcg/mL | INTRAMUSCULAR | Qty: 2

## 2019-06-03 MED FILL — CEFEPIME 1 GRAM SOLUTION FOR INJECTION: 1 gram | INTRAMUSCULAR | Qty: 1

## 2019-06-03 MED FILL — ACETAMINOPHEN 325 MG TABLET: 325 mg | ORAL | Qty: 2

## 2019-06-03 MED FILL — VENTOLIN HFA 90 MCG/ACTUATION AEROSOL INHALER: 90 mcg/actuation | RESPIRATORY_TRACT | Qty: 8

## 2019-06-03 MED FILL — METRO I.V. 500 MG/100 ML INTRAVENOUS PIGGYBACK: 500 mg/100 mL | INTRAVENOUS | Qty: 100
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
## 2019-06-03 NOTE — Progress Notes (Addendum)
Bedside shift change report given to Todd, RN (oncoming nurse) by Melissa S., RN (offgoing nurse). Report included the following information SBAR, Kardex, Intake/Output, MAR, Recent Results and Cardiac Rhythm Paced.     Bedside shift change report given to Kayla, RN (oncoming nurse) by Todd, RN (offgoing nurse). Report included the following information SBAR, Kardex, Intake/Output, MAR, Recent Results and Cardiac Rhythm Paced.

## 2019-06-03 NOTE — Progress Notes (Signed)
NAME: Michael Lozano        DOB:  1934/04/20        MRN:  [id number]         ??              Assessment   :                                               Plan:  AKI on CKD stage 3 (creatinine~ 1.7 at baseline?)  Rhabdomyolysis  Severe Hypernatremia  Covid  Hypotension  Metabolic acidosis  Hypercalcemia  UTI Creatinine 7.7 to 6.8; foley - oliguric;  prerenal +/- ATN    No urgent need for KRT, but is not too far from needing  ??  Give hypotonic balanced crystalloid at 150 ml/hr; prn NS bolus for drop in BP - base fluid D5 in case some "starvation" ketoacidosis - ketones on urine  ??  CK 1862 to 1372    Na 162 to 161   ??  Ca 10.8 to 8.3  ??  Holding Ramipril and statin for now       Subjective:     Chief Complaint:  Seen through ICU window (to limit spread of covid and conserve ppe). I spoke with ICU nurse.    Review of Systems:    Symptom Y/N Comments  Symptom Y/N Comments   Fever/Chills    Chest Pain     Poor Appetite    Edema     Cough    Abdominal Pain     Sputum    Joint Pain     SOB/DOE    Pruritis/Rash     Nausea/vomit    Tolerating PT/OT     Diarrhea    Tolerating Diet     Constipation    Other       Could not obtain due to:      Objective:     VITALS:   Last 24hrs VS reviewed since prior progress note. Most recent are:  Visit Vitals  BP (!) 103/48   Pulse 62   Temp 97.8 ??F (36.6 ??C)   Resp 12   Ht 5\' 10"  (1.778 m)   Wt 76 kg (167 lb 8.8 oz)   SpO2 100%   BMI 24.04 kg/m??       Intake/Output Summary (Last 24 hours) at 06/03/2019 0819  Last data filed at 06/03/2019 0800  Gross per 24 hour   Intake 5896.67 ml   Output 575 ml   Net 5321.67 ml      Telemetry Reviewed:     PHYSICAL EXAM:  General: NAD      Lab Data Reviewed: (see below)    Medications Reviewed: (see below)    PMH/SH reviewed - no change compared to H&P  ________________________________________________________________________  Care Plan discussed with:  Patient     Family      RN      Care Manager                    Consultant:          Comments   >50% of visit spent in counseling and coordination of care       ________________________________________________________________________  06/05/2019, MD     Procedures: see electronic medical records for all procedures/Xrays and details which  were not copied into  this note but were reviewed prior to creation of Plan.      LABS:  Recent Labs     06/03/19  0303 06/02/19  2016   WBC 14.6* 14.1*   HGB 9.5* 10.7*   HCT 30.7* 34.3*   PLT 290 294     Recent Labs     06/03/19  0303 06/02/19  2018 06/02/19  1415   NA 161* 163* 162*   K 4.5 4.9 5.0   CL 134* 137* 135*   CO2 16* 17* 12*   BUN 104* 110* 102*   CREA 6.81* 6.60* 7.67*   GLU 154* 130* 138*   CA 8.3* 8.8 10.8*   MG 3.4* 3.4*  --    PHOS 3.7 4.6  --      Recent Labs     06/03/19  0303 06/02/19  1415   AP 84 116   TP 6.7 10.6*   ALB 2.6* 3.7   GLOB 4.1* 6.9*     Recent Labs     06/03/19  0303   APTT 29.6      No results for input(s): FE, TIBC, PSAT, FERR in the last 72 hours.   No results found for: FOL, RBCF   Recent Labs     06/03/19  0431 06/02/19  2015   PH 7.37 7.32*   PCO2 27* 29*   PO2 93 91     Recent Labs     06/03/19  0303 06/02/19  1415   CPK 1,372* 1,862*     No components found for: [facility]  Lab Results   Component Value Date/Time    Color DARK YELLOW 06/02/2019 01:00 PM    Appearance CLOUDY (A) 06/02/2019 01:00 PM    Specific gravity 1.023 06/02/2019 01:00 PM    pH (UA) 5.0 06/02/2019 01:00 PM    Protein Negative 06/02/2019 01:00 PM    Glucose Negative 06/02/2019 01:00 PM    Ketone TRACE (A) 06/02/2019 01:00 PM    Bilirubin NEGATIVE  08/09/2011 11:25 PM    Urobilinogen 0.2 06/02/2019 01:00 PM    Nitrites Positive (A) 06/02/2019 01:00 PM    Leukocyte Esterase SMALL (A) 06/02/2019 01:00 PM    Epithelial cells FEW 06/02/2019 01:00 PM    Bacteria Negative 06/02/2019 01:00 PM    WBC 0-4 06/02/2019 01:00 PM    RBC 0-5 06/02/2019 01:00 PM       MEDICATIONS:  Current Facility-Administered  Medications   Medication Dose Route Frequency   ??? albuterol (PROVENTIL HFA, VENTOLIN HFA, PROAIR HFA) inhaler 1 Puff  1 Puff Inhalation Q4H PRN   ??? glucose chewable tablet 16 g  4 Tab Oral PRN   ??? dextrose (D50W) injection syrg 12.5-25 g  25-50 mL IntraVENous PRN   ??? glucagon (GLUCAGEN) injection 1 mg  1 mg IntraMUSCular PRN   ??? insulin lispro (HUMALOG) injection   SubCUTAneous Q6H   ??? sodium bicarbonate (8.4%) 50 mEq in dextrose 5% 1,000 mL infusion   IntraVENous CONTINUOUS   ??? sodium chloride (NS) flush 5-40 mL  5-40 mL IntraVENous Q8H   ??? sodium chloride (NS) flush 5-40 mL  5-40 mL IntraVENous PRN   ??? acetaminophen (TYLENOL) tablet 650 mg  650 mg Oral Q6H PRN    Or   ??? acetaminophen (TYLENOL) suppository 650 mg  650 mg Rectal Q6H PRN   ??? polyethylene glycol (MIRALAX) packet 17 g  17 g Oral DAILY PRN   ??? promethazine (PHENERGAN) tablet 12.5 mg  12.5 mg  Oral Q6H PRN    Or   ??? ondansetron (ZOFRAN) injection 4 mg  4 mg IntraVENous Q6H PRN   ??? cefepime (MAXIPIME) 1 g in 0.9% sodium chloride (MBP/ADV) 50 mL MBP  1 g IntraVENous Q24H   ??? Vancomycin - pharmacy to dose   Other Rx Dosing/Monitoring   ??? dextrose 5% infusion  100 mL/hr IntraVENous CONTINUOUS
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
## 2019-06-03 NOTE — Progress Notes (Signed)
Shift Summary-Patient sleeping/drowsy at times. Has woken up twice moaning in pain secondary to pain in lower legs, NP aware., fentanyl given IVX2, 25mcq with relief. Patient oriented to name, periodic confusion. Speech mumbled and difficult to understand at times. Increasing urine output throughout day via Foley. When awake patient tolerated sips of liquids, icecream, and applesauce. Had CT of right foot today as ordered. Continues on Bicarb gtt and D5 gtt. Serial labs.  1930-Bedside and Verbal shift change report given to Todd RN (oncoming nurse) by Melissa S RN (offgoing nurse). Report included the following information SBAR, Kardex, ED Summary, Intake/Output, MAR, Recent Results and Cardiac Rhythm paced.

## 2019-06-03 NOTE — Progress Notes (Signed)
Pharmacist Note - Vancomycin Dosing  Therapy day 2  Indication:  Aspiration PNA  Current regimen: Dosing by levels    Recent Labs     06/03/19  1505 06/03/19  0848 06/03/19  0847 06/03/19  0303   WBC 12.3* 12.8*  --  14.6*   CREA 6.25*  --  6.56* 6.81*   BUN 94*  --  102* 104*       A Random Level resulted at 18.7 mcg/mL which was obtained 23.5 hrs post-dose.       Goal trough: 15 - 20 mcg/mL       Plan: Spoke with Dr. Sinnatamby about changing from vancomycin to linezolid due to renal function. She was concerned about a DDI w/ fentanyl and we discussed possible alternatives. She would like to stop the prn fentanyl and try scheduled acetaminophen and then switch to linezolid tomorrow.

## 2019-06-03 NOTE — Progress Notes (Signed)
Pharmacist Note - Vancomycin Dosing  Therapy day 2  Indication:  Aspiration PNA  Current regimen: Dosing by levels- most recent dose given was load 1750 mg on 4/17 @ 15:35    Recent Labs     06/03/19  1505 06/03/19  0848 06/03/19  0847 06/03/19  0303 06/02/19  2018   WBC 12.3* 12.8*  --  14.6*  --    CREA  --   --  6.56* 6.81* 6.60*   BUN  --   --  102* 104* 110*       A Random Level resulted at 18.7 mcg/mL which was obtained 23.5 hrs post-dose.       Goal trough: 15 - 20 mcg/mL     Plan: Will repeat random level with AM labs. Pharmacy will continue to monitor this patient daily for changes in clinical status and renal function.

## 2019-06-03 NOTE — Progress Notes (Signed)
0730-Bedside and Verbal shift change report given to Melissa S RN (oncoming nurse) by Kaylee RN (offgoing nurse). Report included the following information SBAR, Kardex, ED Summary, Intake/Output, MAR, Recent Results and Cardiac Rhythm SR.

## 2019-06-03 NOTE — Progress Notes (Addendum)
[facility]    ICU TEAM Progress Note    Name: Michael Lozano   DOB: Dec 23, 1934   MRN: [id number]   Date: 06/03/2019      Assessment:   Reason for ICU Admission: GIB  ??  HPI:  Mate??is a 85 y.o.????M with PMH of recent COVID, PM, ??DM2,  And overall is a very poor historian. Information for HPI is obtained via chart and provider.  Per ER note "He has had a gradual decline, very poor p.o. intake to the point that he has not been eating or drinking for several days. ??He also is somnolent and less interactive. ??He has not fallen or had recurrent fevers. ??No vomiting, diarrhea, dyspnea." He apparently loves with is grandson who is 'never home'. While in ER pt was found to be hypernatremic and hypotensive, ICU was consulted after coffee ground emesis   ??      POD:  * No surgery found *    S/P:       Plan:     ICU Problems:  1. AKI on CKD 3  - Place FC  -Cr 6.8, baseline 1.7?  -HD per renal, not a need at this time  -IVF-bicarb and d5 for gentle hydration   -Urine output monitor closely,   - Nephrology consult  - Strict I and O   ??  2. Dehydration  - IVF, sp 2.5 L   - Gentle resuscitation with IVF  -HD per renal  ??  3. Hypernatremia   - Serial BMP Q6H   - Na 158, down from 162  ??  4. Hypothermia  - Matt Holmes hugger for normothermia   ??  5. Ro COVID  -COVID +-see treatment below   -Recent covid 4/2 +  -On RA, cont to monitor, add supplemental oxygen to keep sats >92%  ??  6. Coffee ground emesis   -Serial CBCs Q6h  -GI consult  -PPI BID   -Transfuse hgb <7  -NGT to LWS-minimal output   ??  7. Leukocytosis   -Cultures pending  -Empiric abx therapy started   -UA+  -Urine culture pending, gram negative rods, await final   -On cefepime, will dc and add rocephin with vanc  -MRSA pending   ??  8. Lactic acidosis   - Trend LA  - Gentle hydration   ??  9. Metabolic acidosis  -Bicarb drip 129mL/hr per renal     10.OM  -ID consult, appreciate input   -Podiatry consulted, appreciate input  -Cont broad spectrum coverage     COVID  Treatment:  a. Vit C and Zinc once taking PO    b. Was first diagnosed 4/2 with COVID PNA, not a candidate for remdesivir or toci   c. Steroids decadron 6 mg Q24 X10 days     F - Feeding:  NPO dt coffee ground emesis, diet per GI  A - Analgesia: Fentanyl  S - Sedation: None  T - DVT Prophylaxis: SCD's or Sequential Compression Device   C - Code Status: Full Code  H - Head of Bed: > 30 Degrees  U - Ulcer Prophylaxis: Protonix (pantoprazole)   G - Glycemic Control: Insulin  S - Spontaneous Breathing Trial: No  B - Bowel Regimen: Docusate (Colace)  I - Indwelling Catheter:   Tubes: None  Lines: Peripheral IV  Drains: None  D - De-escalation of Antibiotics:dc cefepime and add rocephin, with vanc, will dc once MRSA results     Subjective:   Overnight Events:  06/03/2019        Objective:     Visit Vitals  BP (!) 103/48   Pulse 62   Temp 97.8 ??F (36.6 ??C)   Resp 12   Ht 5\' 10"  (1.778 m)   Wt 76 kg (167 lb 8.8 oz)   SpO2 100%   BMI 24.04 kg/m??      O2 Device: None (Room air) Temp (24hrs), Avg:97.1 ??F (36.2 ??C), Min:94.5 ??F (34.7 ??C), Max:98 ??F (36.7 ??C)           Hemodynamics:   PAP:   CO:     Wedge:   CI:     CVP:    SVR:       PVR:       Ventilator Settings:  Mode Rate Tidal Volume Pressure FiO2 PEEP                    Peak airway pressure:      Minute ventilation:          Intake/Output:     Intake/Output Summary (Last 24 hours) at 06/03/2019 0908  Last data filed at 06/03/2019 0842  Gross per 24 hour   Intake 5896.67 ml   Output 610 ml   Net 5286.67 ml       Physical Exam:  ??  General:  Lethargic, looks emaciated    Eyes:  Sclera anicteric. Pupils equally round and reactive to light.   Mouth/Throat: Mucous membranes normal, oral pharynx clear   Neck: Supple   Lungs:   Clear to auscultation bilaterally, good effort   CV:  Regular rate and rhythm,no murmur, click, rub or gallop   Abdomen:   Soft, non-tender. bowel sounds normal. non-distended   Extremities: No cyanosis or edema   Skin: Skin color, texture, turgor normal.  no acute rash or lesions   Lymph nodes: Cervical and supraclavicular normal   Musculoskeletal: No swelling or deformity   Lines/Devices:  Intact, no erythema, drainage or tenderness   Neuro- Alert to self and place, overall weak appearing            Labs & Data: Reviewed    Medications: Reviewed    Chest X-Ray:    TTE:    Multidisciplinary Rounds Completed:  No    ABCDEF Bundle/Checklist Completed: Yes    SPECIAL EQUIPMENT: None    DISPOSITION: Stay in ICU    CRITICAL CARE CONSULTANT NOTE  I had a face to face encounter with the patient, reviewed and interpreted patient data including clinical events, labs, images, vital signs, I/O's, and examined patient.  I have discussed the case and the plan and management of the patient's care with the consulting services, the bedside nurses and the respiratory therapist.      NOTE OF PERSONAL INVOLVEMENT IN CARE   This patient has a high probability of imminent, clinically significant deterioration, which requires the highest level of preparedness to intervene urgently. I participated in the decision-making and personally managed or directed the management of the following life and organ supporting interventions that required my frequent assessment to treat or prevent imminent deterioration.    I personally spent 60 minutes of critical care time.  This is time spent at this critically ill patient's bedside actively involved in patient care as well as the coordination of care and discussions with the patient's family.  This does not include any procedural time which has been billed separately.    Arta Silence, NP    [facility]  06/03/2019

## 2019-06-03 NOTE — Progress Notes (Signed)
Progress Notes by Nila Nephew, NP at 06/03/19 [phone]                Author: Nila Nephew, NP  Service: Nurse Practitioner  Author Type: Nurse Practitioner       Filed: 06/03/19 1646  Date of Service: 06/03/19 0908  Status: Addendum          Editor: Nila Nephew, NP (Nurse Practitioner)          Related Notes: Original Note by Nila Nephew, NP (Nurse Practitioner) filed at 06/03/19 1645                        [facility]      ICU TEAM Progress Note         Name:  Michael Lozano        DOB:  07-Apr-1934     MRN:  [id number]        Date:  06/03/2019           Assessment:     Reason for ICU Admission: GIB   ??   HPI:   Michael Lozano??is a 85 y.o.????M with PMH of recent COVID, PM, ??DM2,  And overall is a very poor historian. Information for HPI is obtained via chart and provider.  Per ER note "He has had a gradual decline, very poor p.o. intake to the point  that he has not been eating or drinking for several days. ??He also is somnolent and less interactive. ??He has not fallen or had recurrent fevers. ??No vomiting, diarrhea, dyspnea." He apparently loves with is grandson who is 'never home'. While  in ER pt was found to be hypernatremic and hypotensive, ICU was consulted after coffee ground emesis    ??         POD:   * No surgery found *      S/P:            Plan:        ICU Problems:   1.  AKI on CKD 3   - Place FC   -Cr 6.8, baseline 1.7?   -HD per renal, not a need at this time   -IVF-bicarb and d5 for gentle hydration    -Urine output monitor closely,    - Nephrology consult   - Strict I and O    ??   2.  Dehydration   - IVF, sp 2.5 L    - Gentle resuscitation with IVF   -HD per renal   ??   3.  Hypernatremia    - Serial BMP Q6H    - Na 158, down from 162   ??   4.  Hypothermia   - Matt Holmes hugger for normothermia    ??   5.  Ro COVID   -COVID +-see treatment below    -Recent covid 4/2 +   -On RA, cont to monitor, add supplemental oxygen to keep sats >92%   ??   6.  Coffee ground emesis    -Serial CBCs Q6h    -GI consult   -PPI BID    -Transfuse hgb <7   -NGT to LWS-minimal output    ??   7.  Leukocytosis    -Cultures pending   -Empiric abx therapy started    -UA+   -Urine culture pending, gram negative rods, await final    -On cefepime, will dc and add rocephin with vanc   -MRSA  pending    ??   8.  Lactic acidosis    - Trend LA   - Gentle hydration    ??   9.  Metabolic acidosis   -Bicarb drip 161mL/hr per renal       10.OM   -ID consult, appreciate input    -Podiatry consulted, appreciate input   -Cont broad spectrum coverage       COVID Treatment:   a.  Vit C and Zinc once taking PO     b.  Was first diagnosed 4/2 with COVID PNA, not a candidate for remdesivir or toci    c.  Steroids decadron 6 mg Q24 X10 days       F - Feeding:  NPO dt coffee ground emesis, diet per GI   A - Analgesia: Fentanyl   S - Sedation: None   T - DVT Prophylaxis: SCD's or Sequential Compression Device    C - Code Status: Full Code   H - Head of Bed: > 30 Degrees   U - Ulcer Prophylaxis: Protonix (pantoprazole)    G - Glycemic Control: Insulin   S - Spontaneous Breathing Trial: No   B - Bowel Regimen: Docusate (Colace)   I - Indwelling Catheter:    Tubes: None   Lines: Peripheral IV   Drains: None   D - De-escalation of Antibiotics:dc cefepime and add rocephin, with vanc, will dc once MRSA results         Subjective:     Overnight Events:    06/03/2019              Objective:        Visit Vitals      BP  (!) 103/48     Pulse  62     Temp  97.8 ??F (36.6 ??C)     Resp  12     Ht  5\' 10"  (1.778 m)     Wt  76 kg (167 lb 8.8 oz)     SpO2  100%        BMI  24.04 kg/m??        O2 Device:  None (Room air) Temp (24hrs), Avg:97.1 ??F (36.2 ??C), Min:94.5 ??F (34.7 ??C), Max:98 ??F (36.7 ??C)               Hemodynamics:         PAP:     CO:        Wedge:     CI:        CVP:      SVR:                 PVR:           Ventilator Settings:          Mode  Rate  Tidal Volume  Pressure  FiO2  PEEP                                        Peak airway pressure:             Minute ventilation:               Intake/Output:       Intake/Output Summary (Last 24 hours) at 06/03/2019 0908   Last data filed at 06/03/2019 0842     Gross per 24 hour  Intake  5896.67 ml        Output  610 ml        Net  5286.67 ml           Physical Exam:   ??      General:   Lethargic, looks emaciated      Eyes:   Sclera anicteric. Pupils equally round and reactive to light.     Mouth/Throat:  Mucous membranes normal, oral pharynx clear     Neck:  Supple     Lungs:    Clear to auscultation bilaterally, good effort     CV:   Regular rate and rhythm,no murmur, click, rub or gallop     Abdomen:    Soft, non-tender. bowel sounds normal. non-distended     Extremities:  No cyanosis or edema     Skin:  Skin color, texture, turgor normal. no acute rash or lesions     Lymph nodes:  Cervical and supraclavicular normal     Musculoskeletal:  No swelling or deformity     Lines/Devices:   Intact, no erythema, drainage or tenderness     Neuro-  Alert to self and place, overall weak appearing                  Labs & Data: Reviewed      Medications: Reviewed      Chest X-Ray:      TTE:      Multidisciplinary Rounds Completed:  No      ABCDEF Bundle/Checklist Completed: Yes      SPECIAL EQUIPMENT: None      DISPOSITION: Stay in ICU      CRITICAL CARE CONSULTANT NOTE   I had a face to face encounter with the patient, reviewed and interpreted patient data including clinical events, labs, images, vital signs, I/O's, and examined patient.  I have discussed the case and  the plan and management of the patient's care with the consulting services, the bedside nurses and the respiratory therapist.        NOTE OF PERSONAL INVOLVEMENT IN CARE    This patient has a high probability of imminent, clinically significant deterioration, which requires the highest level of preparedness to intervene urgently. I participated in the decision-making and personally managed or directed the management of the  following life and organ supporting  interventions that required my frequent assessment to treat or prevent imminent deterioration.      I personally spent 60 minutes of critical care time.  This is time spent at this critically ill patient's bedside actively involved in patient care as well as the coordination of care and discussions  with the patient's family.  This does not include any procedural time which has been billed separately.      Arta Silence, NP      [facility]   06/03/2019

## 2019-06-03 NOTE — Progress Notes (Signed)
Progress  Notes by Sherrlyn Hock, MD at 06/03/19 [phone]                Author: Sherrlyn Hock, MD  Service: Nephrology  Author Type: Physician       Filed: 06/03/19 1001  Date of Service: 06/03/19 0819  Status: Signed          Editor: Ameenah Prosser, Loyal Jacobson, MD (Physician)                                                                                                 NAME: Michael Lozano         DOB:  Jun 27, 1934         MRN:  [id number]            ??               Assessment   :                                                Plan:      AKI on CKD stage 3 (creatinine~ 1.7 at baseline?)   Rhabdomyolysis   Severe Hypernatremia   Covid   Hypotension   Metabolic acidosis   Hypercalcemia   UTI  Creatinine 7.7 to 6.8; foley - oliguric;  prerenal +/- ATN      No urgent need for KRT, but is not too far from needing   ??   Give hypotonic balanced crystalloid at 150 ml/hr; prn NS bolus for drop in BP - base fluid D5 in case some "starvation" ketoacidosis - ketones on urine   ??   CK 1862 to 1372      Na 162 to 161    ??   Ca 10.8 to 8.3   ??   Holding Ramipril and statin for now             Subjective:        Chief Complaint:  Seen through ICU window (to limit spread of covid and conserve ppe). I spoke with ICU nurse.      Review of Systems:              Symptom  Y/N  Comments    Symptom  Y/N  Comments             Fever/Chills        Chest Pain                 Poor Appetite        Edema                 Cough        Abdominal Pain         Sputum        Joint Pain         SOB/DOE        Pruritis/Rash         Nausea/vomit  Tolerating PT/OT         Diarrhea        Tolerating Diet                 Constipation        Other               Could not obtain due to:            Objective:        VITALS:    Last 24hrs VS reviewed since prior progress note. Most recent are:   Visit Vitals      BP  (!) 103/48     Pulse  62     Temp  97.8 ??F (36.6 ??C)     Resp  12     Ht  5\' 10"  (1.778 m)     Wt  76 kg (167 lb 8.8 oz)     SpO2  100%        BMI   24.04 kg/m??           Intake/Output Summary (Last 24 hours) at 06/03/2019 0819   Last data filed at 06/03/2019 0800     Gross per 24 hour        Intake  5896.67 ml        Output  575 ml        Net  5321.67 ml         Telemetry Reviewed:       PHYSICAL EXAM:   General: NAD         Lab Data Reviewed: (see below)      Medications Reviewed: (see below)      PMH/SH reviewed - no change compared to H&P   ________________________________________________________________________   Care Plan discussed with:       Patient             Family              RN             Care Manager                            Consultant:                     Comments         >50% of visit spent in counseling and coordination of care            ________________________________________________________________________   06/05/2019, MD       Procedures: see electronic medical records for all procedures/Xrays and details which   were not copied into this note but were reviewed prior to creation of Plan.        LABS:     Recent Labs            06/03/19   0303  06/02/19   2016     WBC  14.6*  14.1*     HGB  9.5*  10.7*     HCT  30.7*  34.3*         PLT  290  294          Recent Labs             06/03/19   0303  06/02/19   2018  06/02/19   1415     NA  161*  163*  162*  K  4.5  4.9  5.0     CL  134*  137*  135*     CO2  16*  17*  12*     BUN  104*  110*  102*     CREA  6.81*  6.60*  7.67*     GLU  154*  130*  138*     CA  8.3*  8.8  10.8*     MG  3.4*  3.4*   --           PHOS  3.7  4.6   --           Recent Labs            06/03/19   0303  06/02/19   1415     AP  84  116     TP  6.7  10.6*     ALB  2.6*  3.7         GLOB  4.1*  6.9*          Recent Labs           06/03/19   0303        APTT  29.6         No results for input(s): FE, TIBC, PSAT, FERR in the last 72 hours.    No results found for: FOL, RBCF      Recent Labs            06/03/19   0431  06/02/19   2015     PH  7.37  7.32*     PCO2  27*  29*         PO2  93  91          Recent Labs             06/03/19   0303  06/02/19   1415         CPK  1,372*  1,862*        No components found for: [facility]     Lab Results         Component  Value  Date/Time            Color  DARK YELLOW  06/02/2019 01:00 PM       Appearance  CLOUDY (A)  06/02/2019 01:00 PM       Specific gravity  1.023  06/02/2019 01:00 PM       pH (UA)  5.0  06/02/2019 01:00 PM       Protein  Negative  06/02/2019 01:00 PM       Glucose  Negative  06/02/2019 01:00 PM       Ketone  TRACE (A)  06/02/2019 01:00 PM       Bilirubin  NEGATIVE   08/09/2011 11:25 PM       Urobilinogen  0.2  06/02/2019 01:00 PM       Nitrites  Positive (A)  06/02/2019 01:00 PM       Leukocyte Esterase  SMALL (A)  06/02/2019 01:00 PM       Epithelial cells  FEW  06/02/2019 01:00 PM       Bacteria  Negative  06/02/2019 01:00 PM       WBC  0-4  06/02/2019 01:00 PM            RBC  0-5  06/02/2019 01:00 PM           MEDICATIONS:  Current Facility-Administered Medications          Medication  Dose  Route  Frequency           ?  albuterol (PROVENTIL HFA, VENTOLIN HFA, PROAIR HFA) inhaler 1 Puff   1 Puff  Inhalation  Q4H PRN     ?  glucose chewable tablet 16 g   4 Tab  Oral  PRN     ?  dextrose (D50W) injection syrg 12.5-25 g   25-50 mL  IntraVENous  PRN     ?  glucagon (GLUCAGEN) injection 1 mg   1 mg  IntraMUSCular  PRN     ?  insulin lispro (HUMALOG) injection     SubCUTAneous  Q6H     ?  sodium bicarbonate (8.4%) 50 mEq in dextrose 5% 1,000 mL infusion     IntraVENous  CONTINUOUS     ?  sodium chloride (NS) flush 5-40 mL   5-40 mL  IntraVENous  Q8H     ?  sodium chloride (NS) flush 5-40 mL   5-40 mL  IntraVENous  PRN     ?  acetaminophen (TYLENOL) tablet 650 mg   650 mg  Oral  Q6H PRN          Or           ?  acetaminophen (TYLENOL) suppository 650 mg   650 mg  Rectal  Q6H PRN     ?  polyethylene glycol (MIRALAX) packet 17 g   17 g  Oral  DAILY PRN     ?  promethazine (PHENERGAN) tablet 12.5 mg   12.5 mg  Oral  Q6H PRN          Or           ?  ondansetron (ZOFRAN) injection 4  mg   4 mg  IntraVENous  Q6H PRN     ?  cefepime (MAXIPIME) 1 g in 0.9% sodium chloride (MBP/ADV) 50 mL MBP   1 g  IntraVENous  Q24H     ?  Vancomycin - pharmacy to dose     Other  Rx Dosing/Monitoring           ?  dextrose 5% infusion   100 mL/hr  IntraVENous  CONTINUOUS

## 2019-06-03 NOTE — Progress Notes (Signed)
Shift Summary-Patient sleeping/drowsy at times. Has woken up twice moaning in pain secondary to pain in lower legs, NP aware., fentanyl given IVX2, with relief. Patient oriented to name, periodic confusion. Speech mumbled and difficult to understand at times. Increasing urine output throughout day via Foley. When awake patient tolerated sips of liquids, icecream, and applesauce. Had CT of right foot today as ordered. Continues on Bicarb gtt and D5 gtt. Serial labs.  1930-Bedside and Verbal shift change report given to Tawanna Cooler RN (oncoming nurse) by Katrinka Blazing RN (offgoing nurse). Report included the following information SBAR, Kardex, ED Summary, Intake/Output, MAR, Recent Results and Cardiac Rhythm paced.

## 2019-06-03 NOTE — Progress Notes (Signed)
0730-Bedside and Verbal shift change report given to Katrinka Blazing RN (oncoming nurse) by Lisette Abu RN (offgoing nurse). Report included the following information SBAR, Kardex, ED Summary, Intake/Output, MAR, Recent Results and Cardiac Rhythm SR.
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
## 2019-06-03 NOTE — Progress Notes (Signed)
Bedside shift change report given to Tawanna Cooler, RN (oncoming nurse) by Katrinka Blazing., RN (offgoing nurse). Report included the following information SBAR, Kardex, Intake/Output, MAR, Recent Results and Cardiac Rhythm Paced.     Bedside shift change report given to Dorathy Daft, Charity fundraiser (oncoming nurse) by Tawanna Cooler, RN (offgoing nurse). Report included the following information SBAR, Kardex, Intake/Output, MAR, Recent Results and Cardiac Rhythm Paced.

## 2019-06-03 NOTE — Progress Notes (Signed)
Pharmacist Note - Vancomycin Dosing  Therapy day 2  Indication:  Aspiration PNA  Current regimen: Dosing by levels- most recent dose given was load 1750 mg on 4/17 @ 15:35    Recent Labs     06/03/19  1505 06/03/19  0848 06/03/19  0847 06/03/19  0303 06/02/19  2018   WBC 12.3* 12.8*  --  14.6*  --    CREA  --   --  6.56* 6.81* 6.60*   BUN  --   --  102* 104* 110*       A Random Level resulted at 18.7 mcg/mL which was obtained 23.5 hrs post-dose.       Goal trough: 15 - 20 mcg/mL     Plan: Will repeat random level with AM labs. Pharmacy will continue to monitor this patient daily for changes in clinical status and renal function.
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
## 2019-06-03 NOTE — Progress Notes (Signed)
Pharmacist Note - Vancomycin Dosing  Therapy day 2  Indication:  Aspiration PNA  Current regimen: Dosing by levels    Recent Labs     06/03/19  1505 06/03/19  0848 06/03/19  0847 06/03/19  0303   WBC 12.3* 12.8*  --  14.6*   CREA 6.25*  --  6.56* 6.81*   BUN 94*  --  102* 104*       A Random Level resulted at 18.7 mcg/mL which was obtained 23.5 hrs post-dose.       Goal trough: 15 - 20 mcg/mL       Plan: Spoke with Dr. Driscilla Moats about changing from vancomycin to linezolid due to renal function. She was concerned about a DDI w/ fentanyl and we discussed possible alternatives. She would like to stop the prn fentanyl and try scheduled acetaminophen and then switch to linezolid tomorrow.

## 2019-06-04 DIAGNOSIS — N179 Acute kidney failure, unspecified: Principal | ICD-10-CM

## 2019-06-04 LAB — METABOLIC PANEL, COMPREHENSIVE
A-G Ratio: 0.5 — ABNORMAL LOW (ref 1.1–2.2)
A-G Ratio: 0.5 — ABNORMAL LOW (ref 1.1–2.2)
A-G Ratio: 0.5 — ABNORMAL LOW (ref 1.1–2.2)
ALT (SGPT): 24 U/L (ref 12–78)
ALT (SGPT): 24 U/L (ref 12–78)
ALT (SGPT): 23 U/L (ref 12–78)
AST (SGOT): 71 U/L — ABNORMAL HIGH (ref 15–37)
AST (SGOT): 64 U/L — ABNORMAL HIGH (ref 15–37)
AST (SGOT): 59 U/L — ABNORMAL HIGH (ref 15–37)
Albumin: 2.4 g/dL — ABNORMAL LOW (ref 3.5–5.0)
Albumin: 2.3 g/dL — ABNORMAL LOW (ref 3.5–5.0)
Albumin: 2.3 g/dL — ABNORMAL LOW (ref 3.5–5.0)
Alk. phosphatase: 82 U/L (ref 45–117)
Alk. phosphatase: 85 U/L (ref 45–117)
Alk. phosphatase: 77 U/L (ref 45–117)
Anion gap: 9 mmol/L (ref 5–15)
Anion gap: 6 mmol/L (ref 5–15)
Anion gap: 6 mmol/L (ref 5–15)
BUN/Creatinine ratio: 15 (ref 12–20)
BUN/Creatinine ratio: 16 (ref 12–20)
BUN/Creatinine ratio: 16 (ref 12–20)
BUN: 82 MG/DL — ABNORMAL HIGH (ref 6–20)
BUN: 71 MG/DL — ABNORMAL HIGH (ref 6–20)
BUN: 63 MG/DL — ABNORMAL HIGH (ref 6–20)
Bilirubin, total: 0.8 MG/DL (ref 0.2–1.0)
Bilirubin, total: 0.7 MG/DL (ref 0.2–1.0)
Bilirubin, total: 0.6 MG/DL (ref 0.2–1.0)
CO2: 18 mmol/L — ABNORMAL LOW (ref 21–32)
CO2: 23 mmol/L (ref 21–32)
CO2: 24 mmol/L (ref 21–32)
Calcium: 8.4 MG/DL — ABNORMAL LOW (ref 8.5–10.1)
Calcium: 8.1 MG/DL — ABNORMAL LOW (ref 8.5–10.1)
Calcium: 7.8 MG/DL — ABNORMAL LOW (ref 8.5–10.1)
Chloride: 123 mmol/L — ABNORMAL HIGH (ref 97–108)
Chloride: 118 mmol/L — ABNORMAL HIGH (ref 97–108)
Chloride: 118 mmol/L — ABNORMAL HIGH (ref 97–108)
Creatinine: 5.53 MG/DL — ABNORMAL HIGH (ref 0.70–1.30)
Creatinine: 4.35 MG/DL — ABNORMAL HIGH (ref 0.70–1.30)
Creatinine: 3.84 MG/DL — ABNORMAL HIGH (ref 0.70–1.30)
GFR est AA: 12 mL/min/{1.73_m2} — ABNORMAL LOW (ref >60)
GFR est AA: 16 mL/min/{1.73_m2} — ABNORMAL LOW (ref >60)
GFR est AA: 18 mL/min/{1.73_m2} — ABNORMAL LOW (ref >60)
GFR est non-AA: 10 mL/min/{1.73_m2} — ABNORMAL LOW (ref >60)
GFR est non-AA: 13 mL/min/{1.73_m2} — ABNORMAL LOW (ref >60)
GFR est non-AA: 15 mL/min/{1.73_m2} — ABNORMAL LOW (ref >60)
Globulin: 4.9 g/dL — ABNORMAL HIGH (ref 2.0–4.0)
Globulin: 4.7 g/dL — ABNORMAL HIGH (ref 2.0–4.0)
Globulin: 4.5 g/dL — ABNORMAL HIGH (ref 2.0–4.0)
Glucose: 203 mg/dL — ABNORMAL HIGH (ref 65–100)
Glucose: 150 mg/dL — ABNORMAL HIGH (ref 65–100)
Glucose: 122 mg/dL — ABNORMAL HIGH (ref 65–100)
Potassium: 4.4 mmol/L (ref 3.5–5.1)
Potassium: 3.8 mmol/L (ref 3.5–5.1)
Potassium: 3.6 mmol/L (ref 3.5–5.1)
Protein, total: 7.3 g/dL (ref 6.4–8.2)
Protein, total: 7 g/dL (ref 6.4–8.2)
Protein, total: 6.8 g/dL (ref 6.4–8.2)
Sodium: 150 mmol/L — ABNORMAL HIGH (ref 136–145)
Sodium: 147 mmol/L — ABNORMAL HIGH (ref 136–145)
Sodium: 148 mmol/L — ABNORMAL HIGH (ref 136–145)

## 2019-06-04 LAB — GLUCOSE, POC
Glucose (POC): 209 mg/dL — ABNORMAL HIGH (ref 65–100)
Glucose (POC): 193 mg/dL — ABNORMAL HIGH (ref 65–100)
Glucose (POC): 142 mg/dL — ABNORMAL HIGH (ref 65–100)
Glucose (POC): 128 mg/dL — ABNORMAL HIGH (ref 65–100)

## 2019-06-04 LAB — S.PNEUMO AG, UR/CSF
Streptococcus pneumoniae Ag: NEGATIVE
Streptococcus pneumoniae Ag: NEGATIVE

## 2019-06-04 LAB — CBC W/O DIFF
ABSOLUTE NRBC: 0 10*3/uL (ref 0.00–0.01)
ABSOLUTE NRBC: 0.02 10*3/uL — ABNORMAL HIGH (ref 0.00–0.01)
HCT: 27.1 % — ABNORMAL LOW (ref 36.6–50.3)
HCT: 27.4 % — ABNORMAL LOW (ref 36.6–50.3)
HGB: 8.7 g/dL — ABNORMAL LOW (ref 12.1–17.0)
HGB: 8.9 g/dL — ABNORMAL LOW (ref 12.1–17.0)
MCH: 28.2 PG (ref 26.0–34.0)
MCH: 28 PG (ref 26.0–34.0)
MCHC: 32.1 g/dL (ref 30.0–36.5)
MCHC: 32.5 g/dL (ref 30.0–36.5)
MCV: 88 FL (ref 80.0–99.0)
MCV: 86.2 FL (ref 80.0–99.0)
MPV: 12.1 FL (ref 8.9–12.9)
MPV: 11.8 FL (ref 8.9–12.9)
NRBC: 0 PER 100 WBC
NRBC: 0.2 PER 100 WBC — ABNORMAL HIGH
PLATELET: 202 10*3/uL (ref 150–400)
PLATELET: 214 10*3/uL (ref 150–400)
RBC: 3.08 M/uL — ABNORMAL LOW (ref 4.10–5.70)
RBC: 3.18 M/uL — ABNORMAL LOW (ref 4.10–5.70)
RDW: 16.6 % — ABNORMAL HIGH (ref 11.5–14.5)
RDW: 16.4 % — ABNORMAL HIGH (ref 11.5–14.5)
WBC: 16 10*3/uL — ABNORMAL HIGH (ref 4.1–11.1)
WBC: 13.2 10*3/uL — ABNORMAL HIGH (ref 4.1–11.1)

## 2019-06-04 LAB — LACTIC ACID
Lactic Acid: 2.8 MMOL/L (ref 0.4–2.0)
Lactic acid: 2.8 MMOL/L — CR (ref 0.4–2.0)

## 2019-06-04 LAB — METABOLIC PANEL, BASIC
Anion gap: 8 mmol/L (ref 5–15)
BUN/Creatinine ratio: 15 (ref 12–20)
BUN: 76 MG/DL — ABNORMAL HIGH (ref 6–20)
CO2: 21 mmol/L (ref 21–32)
Calcium: 7.8 MG/DL — ABNORMAL LOW (ref 8.5–10.1)
Chloride: 119 mmol/L — ABNORMAL HIGH (ref 97–108)
Creatinine: 4.95 MG/DL — ABNORMAL HIGH (ref 0.70–1.30)
GFR est AA: 14 mL/min/{1.73_m2} — ABNORMAL LOW (ref >60)
GFR est non-AA: 11 mL/min/{1.73_m2} — ABNORMAL LOW (ref >60)
Glucose: 196 mg/dL — ABNORMAL HIGH (ref 65–100)
Potassium: 3.8 mmol/L (ref 3.5–5.1)
Sodium: 148 mmol/L — ABNORMAL HIGH (ref 136–145)

## 2019-06-04 LAB — LEGIONELLA PNEUMOPHILA AG, URINE
L pneumophila S1 Ag, urine: NEGATIVE
L pneumophila S1 Ag, urine: NEGATIVE

## 2019-06-04 LAB — SED RATE (ESR): Sed rate, automated: 83 mm/hr — ABNORMAL HIGH (ref 0–20)

## 2019-06-04 LAB — VANCOMYCIN, RANDOM: Vancomycin, random: 14.8 UG/ML

## 2019-06-04 LAB — COMPREHENSIVE METABOLIC PANEL
ALT: 24 U/L (ref 12–78)
ALT: 24 U/L (ref 12–78)
ALT: 23 U/L (ref 12–78)
AST: 71 U/L — ABNORMAL HIGH (ref 15–37)
AST: 64 U/L — ABNORMAL HIGH (ref 15–37)
AST: 59 U/L — ABNORMAL HIGH (ref 15–37)
Albumin/Globulin Ratio: 0.5 — ABNORMAL LOW (ref 1.1–2.2)
Albumin/Globulin Ratio: 0.5 — ABNORMAL LOW (ref 1.1–2.2)
Albumin/Globulin Ratio: 0.5 — ABNORMAL LOW (ref 1.1–2.2)
Albumin: 2.4 g/dL — ABNORMAL LOW (ref 3.5–5.0)
Albumin: 2.3 g/dL — ABNORMAL LOW (ref 3.5–5.0)
Albumin: 2.3 g/dL — ABNORMAL LOW (ref 3.5–5.0)
Alkaline Phosphatase: 82 U/L (ref 45–117)
Alkaline Phosphatase: 85 U/L (ref 45–117)
Alkaline Phosphatase: 77 U/L (ref 45–117)
Anion Gap: 9 mmol/L (ref 5–15)
Anion Gap: 6 mmol/L (ref 5–15)
Anion Gap: 6 mmol/L (ref 5–15)
BUN: 82 MG/DL — ABNORMAL HIGH (ref 6–20)
BUN: 71 MG/DL — ABNORMAL HIGH (ref 6–20)
BUN: 63 MG/DL — ABNORMAL HIGH (ref 6–20)
Bun/Cre Ratio: 15 (ref 12–20)
Bun/Cre Ratio: 16 (ref 12–20)
Bun/Cre Ratio: 16 (ref 12–20)
CO2: 18 mmol/L — ABNORMAL LOW (ref 21–32)
CO2: 23 mmol/L (ref 21–32)
CO2: 24 mmol/L (ref 21–32)
Calcium: 8.4 MG/DL — ABNORMAL LOW (ref 8.5–10.1)
Calcium: 8.1 MG/DL — ABNORMAL LOW (ref 8.5–10.1)
Calcium: 7.8 MG/DL — ABNORMAL LOW (ref 8.5–10.1)
Chloride: 123 mmol/L — ABNORMAL HIGH (ref 97–108)
Chloride: 118 mmol/L — ABNORMAL HIGH (ref 97–108)
Chloride: 118 mmol/L — ABNORMAL HIGH (ref 97–108)
Creatinine: 5.53 MG/DL — ABNORMAL HIGH (ref 0.70–1.30)
Creatinine: 4.35 MG/DL — ABNORMAL HIGH (ref 0.70–1.30)
Creatinine: 3.84 MG/DL — ABNORMAL HIGH (ref 0.70–1.30)
EGFR IF NonAfrican American: 10 mL/min/{1.73_m2} — ABNORMAL LOW (ref >60)
EGFR IF NonAfrican American: 13 mL/min/{1.73_m2} — ABNORMAL LOW (ref >60)
EGFR IF NonAfrican American: 15 mL/min/{1.73_m2} — ABNORMAL LOW (ref >60)
GFR African American: 12 mL/min/{1.73_m2} — ABNORMAL LOW (ref >60)
GFR African American: 16 mL/min/{1.73_m2} — ABNORMAL LOW (ref >60)
GFR African American: 18 mL/min/{1.73_m2} — ABNORMAL LOW (ref >60)
Globulin: 4.9 g/dL — ABNORMAL HIGH (ref 2.0–4.0)
Globulin: 4.7 g/dL — ABNORMAL HIGH (ref 2.0–4.0)
Globulin: 4.5 g/dL — ABNORMAL HIGH (ref 2.0–4.0)
Glucose: 203 mg/dL — ABNORMAL HIGH (ref 65–100)
Glucose: 150 mg/dL — ABNORMAL HIGH (ref 65–100)
Glucose: 122 mg/dL — ABNORMAL HIGH (ref 65–100)
Potassium: 4.4 mmol/L (ref 3.5–5.1)
Potassium: 3.8 mmol/L (ref 3.5–5.1)
Potassium: 3.6 mmol/L (ref 3.5–5.1)
Sodium: 150 mmol/L — ABNORMAL HIGH (ref 136–145)
Sodium: 147 mmol/L — ABNORMAL HIGH (ref 136–145)
Sodium: 148 mmol/L — ABNORMAL HIGH (ref 136–145)
Total Bilirubin: 0.8 MG/DL (ref 0.2–1.0)
Total Bilirubin: 0.7 MG/DL (ref 0.2–1.0)
Total Bilirubin: 0.6 MG/DL (ref 0.2–1.0)
Total Protein: 7.3 g/dL (ref 6.4–8.2)
Total Protein: 7 g/dL (ref 6.4–8.2)
Total Protein: 6.8 g/dL (ref 6.4–8.2)

## 2019-06-04 LAB — BASIC METABOLIC PANEL
Anion Gap: 8 mmol/L (ref 5–15)
BUN: 76 MG/DL — ABNORMAL HIGH (ref 6–20)
Bun/Cre Ratio: 15 (ref 12–20)
CO2: 21 mmol/L (ref 21–32)
Calcium: 7.8 MG/DL — ABNORMAL LOW (ref 8.5–10.1)
Chloride: 119 mmol/L — ABNORMAL HIGH (ref 97–108)
Creatinine: 4.95 MG/DL — ABNORMAL HIGH (ref 0.70–1.30)
EGFR IF NonAfrican American: 11 mL/min/{1.73_m2} — ABNORMAL LOW (ref >60)
GFR African American: 14 mL/min/{1.73_m2} — ABNORMAL LOW (ref >60)
Glucose: 196 mg/dL — ABNORMAL HIGH (ref 65–100)
Potassium: 3.8 mmol/L (ref 3.5–5.1)
Sodium: 148 mmol/L — ABNORMAL HIGH (ref 136–145)

## 2019-06-04 LAB — POCT GLUCOSE
POC Glucose: 209 mg/dL — ABNORMAL HIGH (ref 65–100)
POC Glucose: 193 mg/dL — ABNORMAL HIGH (ref 65–100)
POC Glucose: 142 mg/dL — ABNORMAL HIGH (ref 65–100)
POC Glucose: 128 mg/dL — ABNORMAL HIGH (ref 65–100)

## 2019-06-04 LAB — CBC
Hematocrit: 27.1 % — ABNORMAL LOW (ref 36.6–50.3)
Hematocrit: 27.4 % — ABNORMAL LOW (ref 36.6–50.3)
Hemoglobin: 8.7 g/dL — ABNORMAL LOW (ref 12.1–17.0)
Hemoglobin: 8.9 g/dL — ABNORMAL LOW (ref 12.1–17.0)
MCH: 28.2 PG (ref 26.0–34.0)
MCH: 28 PG (ref 26.0–34.0)
MCHC: 32.1 g/dL (ref 30.0–36.5)
MCHC: 32.5 g/dL (ref 30.0–36.5)
MCV: 88 FL (ref 80.0–99.0)
MCV: 86.2 FL (ref 80.0–99.0)
MPV: 12.1 FL (ref 8.9–12.9)
MPV: 11.8 FL (ref 8.9–12.9)
NRBC Absolute: 0 10*3/uL (ref 0.00–0.01)
NRBC Absolute: 0.02 10*3/uL — ABNORMAL HIGH (ref 0.00–0.01)
Nucleated RBCs: 0 PER 100 WBC
Nucleated RBCs: 0.2 PER 100 WBC — ABNORMAL HIGH
Platelets: 202 10*3/uL (ref 150–400)
Platelets: 214 10*3/uL (ref 150–400)
RBC: 3.08 M/uL — ABNORMAL LOW (ref 4.10–5.70)
RBC: 3.18 M/uL — ABNORMAL LOW (ref 4.10–5.70)
RDW: 16.6 % — ABNORMAL HIGH (ref 11.5–14.5)
RDW: 16.4 % — ABNORMAL HIGH (ref 11.5–14.5)
WBC: 16 10*3/uL — ABNORMAL HIGH (ref 4.1–11.1)
WBC: 13.2 10*3/uL — ABNORMAL HIGH (ref 4.1–11.1)

## 2019-06-04 LAB — LEGIONELLA ANTIGEN, URINE
L. pneumophila Serogp 1 Ur Ag: NEGATIVE
L. pneumophila Serogp 1 Ur Ag: NEGATIVE

## 2019-06-04 LAB — VANCOMYCIN LEVEL, RANDOM: Vancomycin Rm: 14.8 UG/ML

## 2019-06-04 LAB — SEDIMENTATION RATE: Sed Rate: 83 mm/hr — ABNORMAL HIGH (ref 0–20)

## 2019-06-04 MED ORDER — HYDROMORPHONE (PF) 1 MG/ML IJ SOLN
1 mg/mL | INTRAMUSCULAR | Status: DC | PRN
Start: 2019-06-04 — End: 2019-06-18
  Administered 2019-06-04 – 2019-06-05 (×6): via INTRAVENOUS

## 2019-06-04 MED ORDER — DOCUSATE SODIUM 50 MG/5 ML ORAL LIQUID
50 mg/5 mL | Freq: Every day | ORAL | Status: DC
Start: 2019-06-04 — End: 2019-06-05

## 2019-06-04 MED ORDER — ACETAMINOPHEN 325 MG TABLET
325 mg | Freq: Four times a day (QID) | ORAL | Status: DC
Start: 2019-06-04 — End: 2019-06-18
  Administered 2019-06-04 – 2019-06-18 (×55): via ORAL

## 2019-06-04 MED FILL — ACETAMINOPHEN 325 MG TABLET: 325 mg | ORAL | Qty: 2

## 2019-06-04 MED FILL — HYDROMORPHONE (PF) 1 MG/ML IJ SOLN: 1 mg/mL | INTRAMUSCULAR | Qty: 1

## 2019-06-04 MED FILL — ZINC-220 50 MG ZINC (220 MG) CAPSULE: 50 mg zinc (220 mg) | ORAL | Qty: 1

## 2019-06-04 MED FILL — METRO I.V. 500 MG/100 ML INTRAVENOUS PIGGYBACK: 500 mg/100 mL | INTRAVENOUS | Qty: 100

## 2019-06-04 MED FILL — PROTONIX 40 MG INTRAVENOUS SOLUTION: 40 mg | INTRAVENOUS | Qty: 40

## 2019-06-04 MED FILL — DEXTROSE 5% IN WATER (D5W) IV: INTRAVENOUS | Qty: 950

## 2019-06-04 MED FILL — CEFEPIME 1 GRAM SOLUTION FOR INJECTION: 1 gram | INTRAMUSCULAR | Qty: 1

## 2019-06-04 MED FILL — INSULIN LISPRO 100 UNIT/ML INJECTION: 100 unit/mL | SUBCUTANEOUS | Qty: 1

## 2019-06-04 NOTE — Other (Signed)
Michael Lozano NURSE SPECIALIST CONSULT     Initial Presentation   Michael Lozano is a 85 y.o. male with 3 week history of known COVID-19 viral infection who presented to the ED 06/02/19 with his grandson who reported a steady decline in mentation and ADLs.  Blood work in ED significant for AKI, metabolic acidosis, leukocytosis with hypernatremia and admitted for medical management.      HX:   Past Medical History:   Diagnosis Date   ??? Chronic pain     diabetic neuropathy   ??? Diabetes mellitus type II, non insulin dependent ([place])    ??? Other ill-defined conditions(799.89)     Gout   ??? Thromboembolus ([place])     PVD  Osteomyelitis    DX: AKI, Rhabdomyolysis, Metabolic Acidosis, UTI    TX: IV Fluids     Hospital course   Clinical progress has been uncomplicated.     Diabetes    Patient has known Type 2 diabetes, treated PTA.     Family history unknown for diabetes.     Admission BG 138 and A1c 6.2% indicate acceptable diabetes control.     Ambulatory blood glucose management provided by primary care provider: Elmer Picker, NP.    Consulted by Michael Lutes, NP for advanced diabetes nursing assessment and care, specifically related to   [x] Home management assessment      Diabetes-related medical history  Acute complications: None  Neurological complications  Peripheral neuropathy  Microvascular disease  Nephropathy  Macrovascular disease  Peripheral vascular disease and Foot wounds    Diabetes medication history  Drug class Currently in use Discontinued Never used   Biguanide      DDP-4 inhibitor       Sulfonylurea      Thiazolidinedione      GLP-1 RA      SGLT-2 inhibitors      Basal insulin      Bolus insulin      Fixed Dose  Combinations        Subjective   ???I don't have diabetes.???    Nephrology following: HD on hold, gentle D5 IVF  Hypernatremia: Na 148 this am  GI consulted for coffee ground emesis: PPI, trending CBC  Leukocytosis, UA+ with GNR, Wound cx with light E.cloacae  complex, Providencia rettgeri  Podiatry: Osteomyelitis R foot, CT pending  COVID: Decadron 35m x10 days    A1C 6.2%  Glucose 193-209 in the past 24 hrs  5 units correctional insulin   IV abox: flagyl, cefepime  WBC 13.2  GFR 11  Lac 2.8  COVID + on PCR and Rapid  Objective   Physical exam  General Alert, oriented, on HFNC    Visit Vitals  BP (!) 123/58   Pulse 60   Temp 97.4 ??F (36.3 ??C)   Resp 14   Ht 5' 10" (1.778 m)   Wt 76 kg (167 lb 8.8 oz)   SpO2 100%   BMI 24.04 kg/m??     Deferred due to COVID-19     Laboratory      CBC W/O DIFF    Collection Time: 06/04/19  5:31 AM   Result Value Ref Range    WBC 13.2 (H) 4.1 - 11.1 K/uL    RBC 3.18 (L) 4.10 - 5.70 M/uL    HGB 8.9 (L) 12.1 - 17.0 g/dL    HCT 27.4 (L) 36.6 - 50.3 %    MCV 86.2  80.0 - 99.0 FL    MCH 28.0 26.0 - 34.0 PG    MCHC 32.5 30.0 - 36.5 g/dL    RDW 16.4 (H) 11.5 - 14.5 %    PLATELET 214 150 - 400 K/uL    MPV 11.8 8.9 - 12.9 FL    NRBC 0.2 (H) 0 PER 100 WBC    ABSOLUTE NRBC 0.02 (H) 0.00 - 0.01 K/uL         METABOLIC PANEL, COMPREHENSIVE    Collection Time: 06/03/19 11:16 PM   Result Value Ref Range    Sodium 150 (H) 136 - 145 mmol/L    Potassium 4.4 3.5 - 5.1 mmol/L    Chloride 123 (H) 97 - 108 mmol/L    CO2 18 (L) 21 - 32 mmol/L    Anion gap 9 5 - 15 mmol/L    Glucose 203 (H) 65 - 100 mg/dL    BUN 82 (H) 6 - 20 MG/DL    Creatinine 5.53 (H) 0.70 - 1.30 MG/DL    BUN/Creatinine ratio 15 12 - 20      GFR est AA 12 (L) >60 ml/min/1.31m    GFR est non-AA 10 (L) >60 ml/min/1.784m   Calcium 8.4 (L) 8.5 - 10.1 MG/DL    Bilirubin, total 0.8 0.2 - 1.0 MG/DL    ALT (SGPT) 24 12 - 78 U/L    AST (SGOT) 71 (H) 15 - 37 U/L    Alk. phosphatase 82 45 - 117 U/L    Protein, total 7.3 6.4 - 8.2 g/dL    Albumin 2.4 (L) 3.5 - 5.0 g/dL    Globulin 4.9 (H) 2.0 - 4.0 g/dL    A-G Ratio 0.5 (L) 1.1 - 2.2         Factors impacting BG management  Factor Dose Comments   Nutrition:  Carb-controlled meals     60 grams/meal      Drugs:  IV antibiotics    Decadron IV antibiotics       Decadron '6mg'$  daily (Day 2/10)          Pain Right foot pain    Infection: COVID-19     UTI GNR    AKI GFR 11, Baseline 38?    Other: Foot wound Podiatry following  Wound cx with light E.cloacae complex, Providencia rettgeri      Blood glucose pattern        Assessment and Plan   Nursing Diagnosis Risk for unstable blood glucose pattern   Nursing Intervention Domain 5250 Decision-making Support   Nursing Interventions Examined current inpatient diabetes control   Explored factors facilitating and impeding inpatient management  Identified self-management practices impeding diabetes control  Explored corrective strategies with responsible inpatient provider      Evaluation   Michael Lozano an [phone] old gentleman with a history of Type 2 Diabetes admitted with known Michael Lozano complicated by dehydration, AKI, UTI and right foot wound.  Little is known about diabetes related history but A1C on admission 6.2% with glucose of 136 indicates adequate glucose control PTA.  I am unable to find history of antihyperglycemic agents in previous documentation but patient has verbalized that he does not take medications for diabetes.      Glucose now 200 due to: D5 IVF for hypernatremia management, decadron '6mg'$  once daily, AKI with GFR 11, UTI and foot infection.    As sodium improved to 148, can consider IVF adjustment to first address hyperglycemia.  Otherwise, he may  temporarily need low dose basal insulin during acute illness.       Recommendations   1. Adjust to non-dextrose containing IVF once appropriate  2. Continue POC glucose ACHS with correctional insulin at HIGH sensitivity (ESRD)    3.  If off dextrose IVF and glucose remains over goal, adjust diet to consistent carbohydrate diet and Use of Subcutaneous Insulin Order set [id number])  Insulin Dosing Specific recommendation   Basal                                      (Based on weight, BMI & GFR) [x] 0.15 units/kg/D (low dose)          Billing Code(s)   [x] [phone]      Before making these care recommendations, I personally reviewed the hosptialization record, including laboratory and diagnostic data, medications and examined the patient at bedside (circumstances permitting).  Total minutes:  [place], CNS  Diabetes Clinical Nurse Specialist  [facility]  Access via Perfect Serve

## 2019-06-04 NOTE — Progress Notes (Addendum)
[facility] [address]       GI PROGRESS NOTE  Octavia Heir, [place] office  [phone] NP in-hospital cell phone M-F until 4:30  After 5pm or on weekends, please call operator for physician on call      NAME: Michael Lozano   DOB:  05-Dec-1934   MRN:  [id number]       Subjective:   No GI complaints. Discussed with RN, no signs of GI blood loss.    Objective:     VITALS:   Last 24hrs VS reviewed since prior progress note. Most recent are:  Visit Vitals  BP (!) 100/59   Pulse 86   Temp 96.9 ??F (36.1 ??C)   Resp 17   Ht '5\' 10"'$  (1.778 m)   Wt 76 kg (167 lb 8.8 oz)   SpO2 100%   BMI 24.04 kg/m??       PHYSICAL EXAM: evaluated through ICU window due to +COVID  General: No acute distress????  Neurologic:?? Alert    HEENT: EOMI, no scleral icterus   Lungs:  No increased WOB  Heart:   regular rate  Psych:???? Not anxious or agitated.    Lab Data Reviewed:     Recent Results (from the past 24 hour(s))   METABOLIC PANEL, COMPREHENSIVE    Collection Time: 06/03/19  3:05 PM   Result Value Ref Range    Sodium 158 (H) 136 - 145 mmol/L    Potassium 3.8 3.5 - 5.1 mmol/L    Chloride 128 (H) 97 - 108 mmol/L    CO2 21 21 - 32 mmol/L    Anion gap 9 5 - 15 mmol/L    Glucose 133 (H) 65 - 100 mg/dL    BUN 94 (H) 6 - 20 MG/DL    Creatinine 6.25 (H) 0.70 - 1.30 MG/DL    BUN/Creatinine ratio 15 12 - 20      GFR est AA 10 (L) >60 ml/min/1.23m    GFR est non-AA 9 (L) >60 ml/min/1.732m   Calcium 8.0 (L) 8.5 - 10.1 MG/DL    Bilirubin, total 1.0 0.2 - 1.0 MG/DL    ALT (SGPT) 20 12 - 78 U/L    AST (SGOT) 60 (H) 15 - 37 U/L    Alk. phosphatase 74 45 - 117 U/L    Protein, total 6.3 (L) 6.4 - 8.2 g/dL    Albumin 2.4 (L) 3.5 - 5.0 g/dL    Globulin 3.9 2.0 - 4.0 g/dL    A-G Ratio 0.6 (L) 1.1 - 2.2     CBC W/O DIFF    Collection Time: 06/03/19  3:05 PM   Result Value Ref Range    WBC 12.3 (H) 4.1 - 11.1 K/uL    RBC 3.61 (L) 4.10 - 5.70 M/uL    HGB 10.1 (L) 12.1 - 17.0 g/dL    HCT 31.6 (L) 36.6 -  50.3 %    MCV 87.5 80.0 - 99.0 FL    MCH 28.0 26.0 - 34.0 PG    MCHC 32.0 30.0 - 36.5 g/dL    RDW 16.6 (H) 11.5 - 14.5 %    PLATELET 250 150 - 400 K/uL    MPV 11.6 8.9 - 12.9 FL    NRBC 0.2 (H) 0 PER 100 WBC    ABSOLUTE NRBC 0.03 (H) 0.00 - 0.01 K/uL   VANCOMYCIN, RANDOM    Collection Time: 06/03/19  3:05 PM  Result Value Ref Range    Vancomycin, random 18.7 UG/ML   GLUCOSE, POC    Collection Time: 06/03/19  5:34 PM   Result Value Ref Range    Glucose (POC) 168 (H) 65 - 100 mg/dL    Performed by Larey Dresser MELISSA    GLUCOSE, POC    Collection Time: 06/03/19 11:14 PM   Result Value Ref Range    Glucose (POC) 209 (H) 65 - 100 mg/dL    Performed by BENOIT TODD    METABOLIC PANEL, COMPREHENSIVE    Collection Time: 06/03/19 11:16 PM   Result Value Ref Range    Sodium 150 (H) 136 - 145 mmol/L    Potassium 4.4 3.5 - 5.1 mmol/L    Chloride 123 (H) 97 - 108 mmol/L    CO2 18 (L) 21 - 32 mmol/L    Anion gap 9 5 - 15 mmol/L    Glucose 203 (H) 65 - 100 mg/dL    BUN 82 (H) 6 - 20 MG/DL    Creatinine 5.53 (H) 0.70 - 1.30 MG/DL    BUN/Creatinine ratio 15 12 - 20      GFR est AA 12 (L) >60 ml/min/1.42m    GFR est non-AA 10 (L) >60 ml/min/1.744m   Calcium 8.4 (L) 8.5 - 10.1 MG/DL    Bilirubin, total 0.8 0.2 - 1.0 MG/DL    ALT (SGPT) 24 12 - 78 U/L    AST (SGOT) 71 (H) 15 - 37 U/L    Alk. phosphatase 82 45 - 117 U/L    Protein, total 7.3 6.4 - 8.2 g/dL    Albumin 2.4 (L) 3.5 - 5.0 g/dL    Globulin 4.9 (H) 2.0 - 4.0 g/dL    A-G Ratio 0.5 (L) 1.1 - 2.2     CBC W/O DIFF    Collection Time: 06/03/19 11:16 PM   Result Value Ref Range    WBC 16.0 (H) 4.1 - 11.1 K/uL    RBC 3.08 (L) 4.10 - 5.70 M/uL    HGB 8.7 (L) 12.1 - 17.0 g/dL    HCT 27.1 (L) 36.6 - 50.3 %    MCV 88.0 80.0 - 99.0 FL    MCH 28.2 26.0 - 34.0 PG    MCHC 32.1 30.0 - 36.5 g/dL    RDW 16.6 (H) 11.5 - 14.5 %    PLATELET 202 150 - 400 K/uL    MPV 12.1 8.9 - 12.9 FL    NRBC 0.0 0 PER 100 WBC    ABSOLUTE NRBC 0.00 0.00 - 0.01 K/uL   GLUCOSE, POC    Collection Time: 06/04/19  5:30  AM   Result Value Ref Range    Glucose (POC) 193 (H) 65 - 100 mg/dL    Performed by BECorrie MckusickODD    METABOLIC PANEL, BASIC    Collection Time: 06/04/19  5:31 AM   Result Value Ref Range    Sodium 148 (H) 136 - 145 mmol/L    Potassium 3.8 3.5 - 5.1 mmol/L    Chloride 119 (H) 97 - 108 mmol/L    CO2 21 21 - 32 mmol/L    Anion gap 8 5 - 15 mmol/L    Glucose 196 (H) 65 - 100 mg/dL    BUN 76 (H) 6 - 20 MG/DL    Creatinine 4.95 (H) 0.70 - 1.30 MG/DL    BUN/Creatinine ratio 15 12 - 20      GFR est AA 14 (L) >60 ml/min/1.7330m  GFR est non-AA 11 (L) >60 ml/min/1.20m    Calcium 7.8 (L) 8.5 - 10.1 MG/DL   CBC W/O DIFF    Collection Time: 06/04/19  5:31 AM   Result Value Ref Range    WBC 13.2 (H) 4.1 - 11.1 K/uL    RBC 3.18 (L) 4.10 - 5.70 M/uL    HGB 8.9 (L) 12.1 - 17.0 g/dL    HCT 27.4 (L) 36.6 - 50.3 %    MCV 86.2 80.0 - 99.0 FL    MCH 28.0 26.0 - 34.0 PG    MCHC 32.5 30.0 - 36.5 g/dL    RDW 16.4 (H) 11.5 - 14.5 %    PLATELET 214 150 - 400 K/uL    MPV 11.8 8.9 - 12.9 FL    NRBC 0.2 (H) 0 PER 100 WBC    ABSOLUTE NRBC 0.02 (H) 0.00 - 0.01 K/uL   LACTIC ACID    Collection Time: 06/04/19  5:31 AM   Result Value Ref Range    Lactic acid 2.8 (HH) 0.4 - 2.0 MMOL/L   VANCOMYCIN, RANDOM    Collection Time: 06/04/19  5:31 AM   Result Value Ref Range    Vancomycin, random 14.8 UG/ML   SED RATE (ESR)    Collection Time: 06/04/19  5:31 AM   Result Value Ref Range    Sed rate, automated 83 (H) 0 - 20 mm/hr   GLUCOSE, POC    Collection Time: 06/04/19 12:00 PM   Result Value Ref Range    Glucose (POC) 142 (H) 65 - 100 mg/dL    Performed by OMyer HaffRN    METABOLIC PANEL, COMPREHENSIVE    Collection Time: 06/04/19 12:47 PM   Result Value Ref Range    Sodium 147 (H) 136 - 145 mmol/L    Potassium 3.8 3.5 - 5.1 mmol/L    Chloride 118 (H) 97 - 108 mmol/L    CO2 23 21 - 32 mmol/L    Anion gap 6 5 - 15 mmol/L    Glucose 150 (H) 65 - 100 mg/dL    BUN 71 (H) 6 - 20 MG/DL    Creatinine 4.35 (H) 0.70 - 1.30 MG/DL    BUN/Creatinine ratio 16 12 -  20      GFR est AA 16 (L) >60 ml/min/1.743m   GFR est non-AA 13 (L) >60 ml/min/1.7331m  Calcium 8.1 (L) 8.5 - 10.1 MG/DL    Bilirubin, total 0.7 0.2 - 1.0 MG/DL    ALT (SGPT) 24 12 - 78 U/L    AST (SGOT) 64 (H) 15 - 37 U/L    Alk. phosphatase 85 45 - 117 U/L    Protein, total 7.0 6.4 - 8.2 g/dL    Albumin 2.3 (L) 3.5 - 5.0 g/dL    Globulin 4.7 (H) 2.0 - 4.0 g/dL    A-G Ratio 0.5 (L) 1.1 - 2.2              Assessment:   ?? Mild coffee grounds from NG tube: hgb 8.9  ?? AKI  ?? COVID 19  ?? Hypernatremia      Patient Active Problem List   Diagnosis Code   ??? Cardiomyopathy ([place]42.9   ??? CAD (coronary artery disease) I25.10   ??? Hypernatremia E87.0   ??? AKI (acute kidney injury) ([place]CC[place]17.9   ??? GI bleed K92.2     Plan:   ?? BID PPI  ?? Monitor CBC  ?? Continue with conservative  management, will be available to see again on request       Signed By: Redgie Grayer, NP     06/04/2019  2:13 PM       Agree with above  Will sign off

## 2019-06-04 NOTE — Progress Notes (Addendum)
Podiatry  -Pt seen through ICU window.  Pt resting and in no apparent distress.   -CT right lower extremity completed but not read.  Per my interpretation, CT scan does not show osteo along submet 2 region.  Awaiting official read from radiologist.   -Continue antibxs per ID.    -If CT scan negative, no surgical intervention needed from my standpoint.   -Will follow    Addendum: CT negative for osteo.  No surgical intervention needed from my standpoint.  Recommend wound care and short course of antibxs.  Will sign off.  Reconsult PRN.

## 2019-06-04 NOTE — Progress Notes (Signed)
[facility]    ICU TEAM Progress Note    Name: Michael Lozano   DOB: 06-Jun-1934   MRN: [id number]   Date: 06/04/2019      Assessment:   Reason for ICU Admission: GIB  ??  HPI:  Michael Lozano??is a 85 y.o.????M with PMH of recent COVID, PM, ??DM2,  And overall is a very poor historian. Information for HPI is obtained via chart and provider.  Per ER note "He has had a gradual decline, very poor p.o. intake to the point that he has not been eating or drinking for several days. ??He also is somnolent and less interactive. ??He has not fallen or had recurrent fevers. ??No vomiting, diarrhea, dyspnea." He apparently lives with is grandson who is 'never home'. While in ER pt was found to be hypernatremic and hypotensive, ICU was consulted after coffee ground emesis       POD:  * No surgery found *    S/P:       Plan:     ICU Problems:  1. AKI on CKD 3  - Place FC  -Cr 4.9 this am, baseline 1.7?  -HD per renal, not a need at this time  -IVF-D5W with 45meq sodium bicarb at 164ml per renal   -Urine output monitor closely,   - Nephrology consult  - Strict I and O   ??  2. Dehydration  - IVF, sp 2.5 L   - Gentle resuscitation with IVF  -HD per renal  ??  3. Hypernatremia   - Serial BMP Q6H   - Na 148-better  ??  4. Hypothermia  - [place]ill [place]ta hugger for normothermia   ??  5. Ro COVID  -COVID +-see treatment below   -Recent covid 4/2 +  -On RA, cont to monitor, add supplemental oxygen to keep sats >92%  ??  6. Coffee ground emesis   -Serial CBCs Q6h  -GI consult  -PPI BID   -Transfuse hgb <7  -NGT to LWS-minimal output   ??  7. Leukocytosis   -Cultures pending  -Empiric abx therapy started   -UA+  -Urine culture pending, gram negative rods, await final   -Abx per ID   -MRSA pending   ??  8. Lactic acidosis   - Trend LA  - Gentle hydration   ??  9. Metabolic acidosis  -W0J +81XBJ sodium Bicarb drip 176mL/hr per renal     10.Non healing wound on the RLE OM?   -ID consult, appreciate input   -Podiatry consulted, appreciate input  -Cont broad spectrum  coverage     COVID Treatment:  a. Vit C and Zinc once taking PO    b. Was first diagnosed 4/2 with COVID PNA, not a candidate for remdesivir or toci   c. Steroids decadron 6 mg Q24 X10 days     F - Feeding:  NPO dt coffee ground emesis, diet per GI  A - Analgesia: Fentanyl  S - Sedation: None  T - DVT Prophylaxis: SCD's or Sequential Compression Device   C - Code Status: Full Code  H - Head of Bed: > 30 Degrees  U - Ulcer Prophylaxis: Protonix (pantoprazole)   G - Glycemic Control: Insulin  S - Spontaneous Breathing Trial: No  B - Bowel Regimen: Docusate (Colace)  I - Indwelling Catheter:   Tubes: None  Lines: Peripheral IV  Drains: None  D - De-escalation of Antibiotics:per ID, appreciate input     Subjective:   Overnight Events:   06/04/2019  Objective:     Visit Vitals  BP 114/64 (BP 1 Location: Right upper arm, BP Patient Position: At rest)   Pulse 66   Temp 97.9 ??F (36.6 ??C)   Resp 14   Ht 5\' 10"  (1.778 m)   Wt 76 kg (167 lb 8.8 oz)   SpO2 99%   BMI 24.04 kg/m??      O2 Device: None (Room air) Temp (24hrs), Avg:97.8 ??F (36.6 ??C), Min:97.4 ??F (36.3 ??C), Max:98.1 ??F (36.7 ??C)           Hemodynamics:   PAP:   CO:     Wedge:   CI:     CVP:    SVR:       PVR:       Ventilator Settings:  Mode Rate Tidal Volume Pressure FiO2 PEEP                    Peak airway pressure:      Minute ventilation:          Intake/Output:     Intake/Output Summary (Last 24 hours) at 06/04/2019 1225  Last data filed at 06/04/2019 1200  Gross per 24 hour   Intake 6350 ml   Output 3270 ml   Net 3080 ml       Physical Exam:  ??  General:  Lethargic, looks emaciated    Eyes:  Sclera anicteric. Pupils equally round and reactive to light.   Mouth/Throat: Mucous membranes normal, oral pharynx clear   Neck: Supple   Lungs:   Clear to auscultation bilaterally, good effort   CV:  Regular rate and rhythm,no murmur, click, rub or gallop   Abdomen:   Soft, non-tender. bowel sounds normal. non-distended   Extremities: No cyanosis or edema   Skin: Skin  color, texture, turgor normal. no acute rash or lesions   Lymph nodes: Cervical and supraclavicular normal   Musculoskeletal: No swelling or deformity   Lines/Devices:  Intact, no erythema, drainage or tenderness   Neuro- Alert to self and place, overall weak appearing            Labs & Data: Reviewed    Medications: Reviewed    Chest X-Ray:    TTE:    Multidisciplinary Rounds Completed:  No    ABCDEF Bundle/Checklist Completed: Yes    SPECIAL EQUIPMENT: None    DISPOSITION: Stay in ICU    CRITICAL CARE CONSULTANT NOTE  I had a face to face encounter with the patient, reviewed and interpreted patient data including clinical events, labs, images, vital signs, I/O's, and examined patient.  I have discussed the case and the plan and management of the patient's care with the consulting services, the bedside nurses and the respiratory therapist.      NOTE OF PERSONAL INVOLVEMENT IN CARE   This patient has a high probability of imminent, clinically significant deterioration, which requires the highest level of preparedness to intervene urgently. I participated in the decision-making and personally managed or directed the management of the following life and organ supporting interventions that required my frequent assessment to treat or prevent imminent deterioration.    I personally spent 60 minutes of critical care time.  This is time spent at this critically ill patient's bedside actively involved in patient care as well as the coordination of care and discussions with the patient's family.  This does not include any procedural time which has been billed separately.    06/06/2019, NP  [facility]  06/04/2019

## 2019-06-04 NOTE — Progress Notes (Signed)
NAME: Michael Lozano        DOB:  September 13, 1934        MRN:  [id number]         ??              Assessment   :                                               Plan:  AKI on CKD stage 3 (creatinine~ 1.7 at baseline?)  Rhabdomyolysis  Severe Hypernatremia  Covid  Hypotension  Metabolic acidosis  Hypercalcemia  UTI Creatinine showing improvement (peak 7.7mg /dl); Down to 4.9mg /dl. Non-oliguric;  prerenal +/- ATN    Na improving to 148-> 15meq drop over last 26hrs (avg 0.96meq/hr so  okay).    Lets stop IV D5W and decrease D5W +79meq Sodium Bicarbonate to 100cc/hr  ??  CK 1862 to 1372    ??  Holding Ramipril and statin for now    Discussed with ICU team       Subjective:     Chief Complaint:  Seen through ICU window (to limit spread of covid and conserve ppe). I spoke with ICU team during their rounds. UOP 2.6L yesterday. Remains on RA.    Review of Systems:    Symptom Y/N Comments  Symptom Y/N Comments   Fever/Chills    Chest Pain     Poor Appetite    Edema     Cough    Abdominal Pain     Sputum    Joint Pain     SOB/DOE    Pruritis/Rash     Nausea/vomit    Tolerating PT/OT     Diarrhea    Tolerating Diet     Constipation    Other       Could not obtain due to:      Objective:     VITALS:   Last 24hrs VS reviewed since prior progress note. Most recent are:  Visit Vitals  BP 114/64 (BP 1 Location: Right upper arm, BP Patient Position: At rest)   Pulse 66   Temp 96.9 ??F (36.1 ??C)   Resp 14   Ht 5\' 10"  (1.778 m)   Wt 76 kg (167 lb 8.8 oz)   SpO2 99%   BMI 24.04 kg/m??       Intake/Output Summary (Last 24 hours) at 06/04/2019 1235  Last data filed at 06/04/2019 1200  Gross per 24 hour   Intake 6350 ml   Output 3270 ml   Net 3080 ml      Telemetry Reviewed:     PHYSICAL EXAM:  General: NAD      Lab Data Reviewed: (see below)    Medications Reviewed: (see below)    PMH/SH reviewed - no change compared to H&P   ________________________________________________________________________  Care Plan discussed with:  Patient     Family      RN     Care Manager                    Consultant:          Comments   >50% of visit spent in counseling and coordination of care       ________________________________________________________________________  Yarrow Linhart Sherrye Payor, MD     Procedures: see electronic medical records for all procedures/Xrays and details which  were not  copied into this note but were reviewed prior to creation of Plan.      LABS:  Recent Labs     06/04/19  0531 06/03/19  2316   WBC 13.2* 16.0*   HGB 8.9* 8.7*   HCT 27.4* 27.1*   PLT 214 202     Recent Labs     06/04/19  0531 06/03/19  2316 06/03/19  1505 06/03/19  0303 06/03/19  0303 06/02/19  2018   NA 148* 150* 158*   < > 161* 163*   K 3.8 4.4 3.8   < > 4.5 4.9   CL 119* 123* 128*   < > 134* 137*   CO2 21 18* 21   < > 16* 17*   BUN 76* 82* 94*   < > 104* 110*   CREA 4.95* 5.53* 6.25*   < > 6.81* 6.60*   GLU 196* 203* 133*   < > 154* 130*   CA 7.8* 8.4* 8.0*   < > 8.3* 8.8   MG  --   --   --   --  3.4* 3.4*   PHOS  --   --   --   --  3.7 4.6    < > = values in this interval not displayed.     Recent Labs     06/03/19  2316 06/03/19  1505 06/03/19  0847   AP 82 74 85   TP 7.3 6.3* 6.4   ALB 2.4* 2.4* 2.6*   GLOB 4.9* 3.9 3.8     Recent Labs     06/03/19  0303   APTT 29.6      No results for input(s): FE, TIBC, PSAT, FERR in the last 72 hours.   No results found for: FOL, RBCF   Recent Labs     06/03/19  0431 06/02/19  2015   PH 7.37 7.32*   PCO2 27* 29*   PO2 93 91     Recent Labs     06/03/19  0303 06/02/19  1415   CPK 1,372* 1,862*     No components found for: [facility]  Lab Results   Component Value Date/Time    Color DARK YELLOW 06/02/2019 01:00 PM    Appearance CLOUDY (A) 06/02/2019 01:00 PM    Specific gravity 1.023 06/02/2019 01:00 PM    pH (UA) 5.0 06/02/2019 01:00 PM    Protein Negative 06/02/2019 01:00 PM    Glucose Negative 06/02/2019 01:00 PM    Ketone TRACE (A)  06/02/2019 01:00 PM    Bilirubin NEGATIVE  08/09/2011 11:25 PM    Urobilinogen 0.2 06/02/2019 01:00 PM    Nitrites Positive (A) 06/02/2019 01:00 PM    Leukocyte Esterase SMALL (A) 06/02/2019 01:00 PM    Epithelial cells FEW 06/02/2019 01:00 PM    Bacteria Negative 06/02/2019 01:00 PM    WBC 0-4 06/02/2019 01:00 PM    RBC 0-5 06/02/2019 01:00 PM       MEDICATIONS:  Current Facility-Administered Medications   Medication Dose Route Frequency   ??? HYDROmorphone (PF) (DILAUDID) injection 0.5 mg  0.5 mg IntraVENous Q4H PRN   ??? [START ON 06/05/2019] docusate (COLACE) 50 mg/5 mL oral liquid 100 mg  100 mg Oral DAILY   ??? albuterol (PROVENTIL HFA, VENTOLIN HFA, PROAIR HFA) inhaler 1 Puff  1 Puff Inhalation Q4H PRN   ??? glucose chewable tablet 16 g  4 Tab Oral PRN   ??? dextrose (D50W) injection syrg 12.5-25 g  25-50  mL IntraVENous PRN   ??? glucagon (GLUCAGEN) injection 1 mg  1 mg IntraMUSCular PRN   ??? insulin lispro (HUMALOG) injection   SubCUTAneous Q6H   ??? pantoprazole (PROTONIX) 40 mg in 0.9% sodium chloride 10 mL injection  40 mg IntraVENous Q12H   ??? acetaminophen (TYLENOL) tablet 650 mg  650 mg Oral Q6H PRN    Or   ??? acetaminophen (TYLENOL) suppository 650 mg  650 mg Rectal Q6H PRN   ??? zinc sulfate (ZINCATE) 50 mg zinc (220 mg) capsule 1 Cap  1 Cap Oral DAILY   ??? cefepime (MAXIPIME) 1 g in 0.9% sodium chloride (MBP/ADV) 50 mL MBP  1 g IntraVENous Q24H   ??? metroNIDAZOLE (FLAGYL) IVPB premix 500 mg  500 mg IntraVENous Q12H   ??? acetaminophen (TYLENOL) tablet 650 mg  650 mg Oral Q6H   ??? sodium bicarbonate (8.4%) 50 mEq in dextrose 5% 1,000 mL infusion   IntraVENous CONTINUOUS   ??? sodium chloride (NS) flush 5-40 mL  5-40 mL IntraVENous Q8H   ??? sodium chloride (NS) flush 5-40 mL  5-40 mL IntraVENous PRN   ??? polyethylene glycol (MIRALAX) packet 17 g  17 g Oral DAILY PRN   ??? promethazine (PHENERGAN) tablet 12.5 mg  12.5 mg Oral Q6H PRN    Or   ??? ondansetron (ZOFRAN) injection 4 mg  4 mg IntraVENous Q6H PRN   ??? Vancomycin - pharmacy to  dose   Other Rx Dosing/Monitoring

## 2019-06-04 NOTE — Progress Notes (Signed)
Infectious Disease Progress Note       Subjective:     Michael Lozano seen  He says he has had foot wound for "2 years". Says sensation is not good in feet  Patient denied fever, chills, nausea, vomiting diarrhea  Says he has not been walking at home  Denied  Cough or shortness of breath  Asking to have NG tube out and lemonade to drink     Discussed with ICU team            Objective:    Vitals:   Patient Vitals for the past 24 hrs:   Temp Pulse Resp BP SpO2   06/04/19 1200 96.9 ??F (36.1 ??C) 66 14 114/64 99 %   06/04/19 1100 ??? 60 10 (!) 96/46 99 %   06/04/19 1000 ??? 62 12 (!) 117/49 99 %   06/04/19 0900 ??? 81 18 100/67 100 %   06/04/19 0800 97.9 ??F (36.6 ??C) 61 12 (!) 117/52 100 %   06/04/19 0700 ??? 60 14 ??? 100 %   06/04/19 0600 ??? 62 21 (!) 123/58 99 %   06/04/19 0500 ??? 60 13 (!) 107/49 100 %   06/04/19 0400 97.4 ??F (36.3 ??C) 62 14 (!) 106/50 100 %   06/04/19 0300 ??? 63 13 (!) 108/45 99 %   06/04/19 0200 ??? 70 14 (!) 112/57 100 %   06/04/19 0100 ??? 64 12 (!) 110/57 100 %   06/04/19 0000 97.7 ??F (36.5 ??C) 69 16 (!) 105/55 100 %   06/03/19 2300 ??? 60 12 (!) 109/52 100 %   06/03/19 2200 ??? 63 14 (!) 96/49 100 %   06/03/19 2100 ??? 60 11 (!) 112/55 100 %   06/03/19 2000 98.1 ??F (36.7 ??C) 63 14 (!) 103/51 100 %   06/03/19 1900 ??? 71 25 (!) 110/59 99 %   06/03/19 1800 ??? 73 13 (!) 98/52 99 %   06/03/19 1700 97.9 ??F (36.6 ??C) 74 23 (!) 104/55 99 %   06/03/19 1600 ??? 76 21 (!) 94/44 99 %   06/03/19 1510 ??? 81 17 (!) 99/40 100 %   06/03/19 1500 ??? 79 17 (!) 77/51 100 %   06/03/19 1400 ??? 65 18 (!) 99/49 100 %       Physical Exam:  Gen: No apparent distress  HEENT:   + NG, no  scleral icterus, no thrush,  CV: S1,2 heard regularly on monitor  Lungs: non labored breathing   Abdomen: soft, non tender,   Skin: no rash on face, extremities   Psych:  non tearful  Neuro: awake, alert, knew his name, knew he was at [facility], no tremors   Musculoskeletal:  + R plantar wound, no surrounding erythema, no discharge, + chronic venous discoloration seen on  feet     Medications:    Current Facility-Administered Medications:   ???  HYDROmorphone (PF) (DILAUDID) injection 0.5 mg, 0.5 mg, IntraVENous, Q4H PRN, Lockard, Sara H, NP, 0.5 mg at 06/04/19 1012  ???  [START ON 06/05/2019] docusate (COLACE) 50 mg/5 mL oral liquid 100 mg, 100 mg, Oral, DAILY, Lockard, Sara H, NP  ???  albuterol (PROVENTIL HFA, VENTOLIN HFA, PROAIR HFA) inhaler 1 Puff, 1 Puff, Inhalation, Q4H PRN, Erline Levine, NP  ???  glucose chewable tablet 16 g, 4 Tab, Oral, PRN, Erline Levine, NP  ???  dextrose (D50W) injection syrg 12.5-25 g, 25-50 mL, IntraVENous, PRN, Erline Levine, NP  ???  glucagon (  GLUCAGEN) injection 1 mg, 1 mg, IntraMUSCular, PRN, Erline Levine, NP  ???  insulin lispro (HUMALOG) injection, , SubCUTAneous, Q6H, Erline Levine, NP, 2 Units at 06/04/19 1232  ???  pantoprazole (PROTONIX) 40 mg in 0.9% sodium chloride 10 mL injection, 40 mg, IntraVENous, Q12H, Paula Libra, MD, 40 mg at 06/04/19 0830  ???  acetaminophen (TYLENOL) tablet 650 mg, 650 mg, Oral, Q6H PRN **OR** acetaminophen (TYLENOL) suppository 650 mg, 650 mg, Rectal, Q6H PRN, Lockard, Remi Deter, NP  ???  zinc sulfate (ZINCATE) 50 mg zinc (220 mg) capsule 1 Cap, 1 Cap, Oral, DAILY, Lockard, Remi Deter, NP, 1 Cap at 06/04/19 0831  ???  cefepime (MAXIPIME) 1 g in 0.9% sodium chloride (MBP/ADV) 50 mL MBP, 1 g, IntraVENous, Q24H, Sinnatamby, Diane S, MD  ???  metroNIDAZOLE (FLAGYL) IVPB premix 500 mg, 500 mg, IntraVENous, Q12H, Sinnatamby, Diane S, MD, Last Rate: 100 mL/hr at 06/04/19 0834, 500 mg at 06/04/19 0834  ???  acetaminophen (TYLENOL) tablet 650 mg, 650 mg, Oral, Q6H, Sinnatamby, Diane S, MD, 650 mg at 06/04/19 1158  ???  sodium bicarbonate (8.4%) 50 mEq in dextrose 5% 1,000 mL infusion, , IntraVENous, CONTINUOUS, Patel, Deep V, MD, Last Rate: 100 mL/hr at 06/04/19 1238, Rate Change at 06/04/19 1238  ???  sodium chloride (NS) flush 5-40 mL, 5-40 mL, IntraVENous, Q8H, Lockard, Sara H, NP, 10 mL at 06/04/19 6045  ???  sodium chloride (NS) flush 5-40 mL, 5-40  mL, IntraVENous, PRN, Jessie Foot, Remi Deter, NP  ???  polyethylene glycol (MIRALAX) packet 17 g, 17 g, Oral, DAILY PRN, Jessie Foot, Remi Deter, NP  ???  promethazine (PHENERGAN) tablet 12.5 mg, 12.5 mg, Oral, Q6H PRN **OR** ondansetron (ZOFRAN) injection 4 mg, 4 mg, IntraVENous, Q6H PRN, Arta Silence, NP, 4 mg at 06/02/19 1918  ???  Vancomycin - pharmacy to dose, , Other, Rx Dosing/Monitoring, Lockard, Remi Deter, NP      Labs:  Recent Results (from the past 24 hour(s))   METABOLIC PANEL, COMPREHENSIVE    Collection Time: 06/03/19  3:05 PM   Result Value Ref Range    Sodium 158 (H) 136 - 145 mmol/L    Potassium 3.8 3.5 - 5.1 mmol/L    Chloride 128 (H) 97 - 108 mmol/L    CO2 21 21 - 32 mmol/L    Anion gap 9 5 - 15 mmol/L    Glucose 133 (H) 65 - 100 mg/dL    BUN 94 (H) 6 - 20 MG/DL    Creatinine 6.25 (H) 0.70 - 1.30 MG/DL    BUN/Creatinine ratio 15 12 - 20      GFR est AA 10 (L) >60 ml/min/1.21m    GFR est non-AA 9 (L) >60 ml/min/1.746m   Calcium 8.0 (L) 8.5 - 10.1 MG/DL    Bilirubin, total 1.0 0.2 - 1.0 MG/DL    ALT (SGPT) 20 12 - 78 U/L    AST (SGOT) 60 (H) 15 - 37 U/L    Alk. phosphatase 74 45 - 117 U/L    Protein, total 6.3 (L) 6.4 - 8.2 g/dL    Albumin 2.4 (L) 3.5 - 5.0 g/dL    Globulin 3.9 2.0 - 4.0 g/dL    A-G Ratio 0.6 (L) 1.1 - 2.2     CBC W/O DIFF    Collection Time: 06/03/19  3:05 PM   Result Value Ref Range    WBC 12.3 (H) 4.1 - 11.1 K/uL    RBC 3.61 (L) 4.10 -  5.70 M/uL    HGB 10.1 (L) 12.1 - 17.0 g/dL    HCT 31.6 (L) 36.6 - 50.3 %    MCV 87.5 80.0 - 99.0 FL    MCH 28.0 26.0 - 34.0 PG    MCHC 32.0 30.0 - 36.5 g/dL    RDW 16.6 (H) 11.5 - 14.5 %    PLATELET 250 150 - 400 K/uL    MPV 11.6 8.9 - 12.9 FL    NRBC 0.2 (H) 0 PER 100 WBC    ABSOLUTE NRBC 0.03 (H) 0.00 - 0.01 K/uL   VANCOMYCIN, RANDOM    Collection Time: 06/03/19  3:05 PM   Result Value Ref Range    Vancomycin, random 18.7 UG/ML   GLUCOSE, POC    Collection Time: 06/03/19  5:34 PM   Result Value Ref Range    Glucose (POC) 168 (H) 65 - 100 mg/dL    Performed by  Larey Dresser MELISSA    GLUCOSE, POC    Collection Time: 06/03/19 11:14 PM   Result Value Ref Range    Glucose (POC) 209 (H) 65 - 100 mg/dL    Performed by BENOIT TODD    METABOLIC PANEL, COMPREHENSIVE    Collection Time: 06/03/19 11:16 PM   Result Value Ref Range    Sodium 150 (H) 136 - 145 mmol/L    Potassium 4.4 3.5 - 5.1 mmol/L    Chloride 123 (H) 97 - 108 mmol/L    CO2 18 (L) 21 - 32 mmol/L    Anion gap 9 5 - 15 mmol/L    Glucose 203 (H) 65 - 100 mg/dL    BUN 82 (H) 6 - 20 MG/DL    Creatinine 5.53 (H) 0.70 - 1.30 MG/DL    BUN/Creatinine ratio 15 12 - 20      GFR est AA 12 (L) >60 ml/min/1.35m    GFR est non-AA 10 (L) >60 ml/min/1.771m   Calcium 8.4 (L) 8.5 - 10.1 MG/DL    Bilirubin, total 0.8 0.2 - 1.0 MG/DL    ALT (SGPT) 24 12 - 78 U/L    AST (SGOT) 71 (H) 15 - 37 U/L    Alk. phosphatase 82 45 - 117 U/L    Protein, total 7.3 6.4 - 8.2 g/dL    Albumin 2.4 (L) 3.5 - 5.0 g/dL    Globulin 4.9 (H) 2.0 - 4.0 g/dL    A-G Ratio 0.5 (L) 1.1 - 2.2     CBC W/O DIFF    Collection Time: 06/03/19 11:16 PM   Result Value Ref Range    WBC 16.0 (H) 4.1 - 11.1 K/uL    RBC 3.08 (L) 4.10 - 5.70 M/uL    HGB 8.7 (L) 12.1 - 17.0 g/dL    HCT 27.1 (L) 36.6 - 50.3 %    MCV 88.0 80.0 - 99.0 FL    MCH 28.2 26.0 - 34.0 PG    MCHC 32.1 30.0 - 36.5 g/dL    RDW 16.6 (H) 11.5 - 14.5 %    PLATELET 202 150 - 400 K/uL    MPV 12.1 8.9 - 12.9 FL    NRBC 0.0 0 PER 100 WBC    ABSOLUTE NRBC 0.00 0.00 - 0.01 K/uL   GLUCOSE, POC    Collection Time: 06/04/19  5:30 AM   Result Value Ref Range    Glucose (POC) 193 (H) 65 - 100 mg/dL    Performed by BECorrie MckusickODD    METABOLIC PANEL, BASIC  Collection Time: 06/04/19  5:31 AM   Result Value Ref Range    Sodium 148 (H) 136 - 145 mmol/L    Potassium 3.8 3.5 - 5.1 mmol/L    Chloride 119 (H) 97 - 108 mmol/L    CO2 21 21 - 32 mmol/L    Anion gap 8 5 - 15 mmol/L    Glucose 196 (H) 65 - 100 mg/dL    BUN 76 (H) 6 - 20 MG/DL    Creatinine 4.95 (H) 0.70 - 1.30 MG/DL    BUN/Creatinine ratio 15 12 - 20      GFR est AA 14  (L) >60 ml/min/1.72m    GFR est non-AA 11 (L) >60 ml/min/1.731m   Calcium 7.8 (L) 8.5 - 10.1 MG/DL   CBC W/O DIFF    Collection Time: 06/04/19  5:31 AM   Result Value Ref Range    WBC 13.2 (H) 4.1 - 11.1 K/uL    RBC 3.18 (L) 4.10 - 5.70 M/uL    HGB 8.9 (L) 12.1 - 17.0 g/dL    HCT 27.4 (L) 36.6 - 50.3 %    MCV 86.2 80.0 - 99.0 FL    MCH 28.0 26.0 - 34.0 PG    MCHC 32.5 30.0 - 36.5 g/dL    RDW 16.4 (H) 11.5 - 14.5 %    PLATELET 214 150 - 400 K/uL    MPV 11.8 8.9 - 12.9 FL    NRBC 0.2 (H) 0 PER 100 WBC    ABSOLUTE NRBC 0.02 (H) 0.00 - 0.01 K/uL   LACTIC ACID    Collection Time: 06/04/19  5:31 AM   Result Value Ref Range    Lactic acid 2.8 (HH) 0.4 - 2.0 MMOL/L   VANCOMYCIN, RANDOM    Collection Time: 06/04/19  5:31 AM   Result Value Ref Range    Vancomycin, random 14.8 UG/ML   SED RATE (ESR)    Collection Time: 06/04/19  5:31 AM   Result Value Ref Range    Sed rate, automated 83 (H) 0 - 20 mm/hr   GLUCOSE, POC    Collection Time: 06/04/19 12:00 PM   Result Value Ref Range    Glucose (POC) 142 (H) 65 - 100 mg/dL    Performed by OdMyer HaffN            Micro:     Blood: 06/02/19  Special Requests: NO SPECIAL REQUESTS  ?? Preliminary   Culture result: NO GROWTH 2 DAYS              Urine:   Specimen Information: Cath Urine   ??    Component Value Ref Range & Units Status   Special Requests: NO SPECIAL REQUESTS  ?? Preliminary   Colony Count >100,000   COLONIES/mL  ?? Preliminary   Culture result: GRAM NEGATIVE RODSAbnormal           05/17/19 Wound   Foot; Wound    ??    Component Value Ref Range & Units Status   Special Requests: NO SPECIAL REQUESTS  ?? Final   GRAM STAIN NO WBC'S SEEN  ?? Final   GRAM STAIN FEW GRAM POSITIVE COCCI  ?? Final   GRAM STAIN FEW GRAM POSITIVE RODS  ?? Final   Culture result: Abnormal   ?? Final   LIGHT ENTEROBACTER CLOACAE COMPLEX    Culture result: LIGHT PROVIDENCIA RETTGERIAbnormal   ?? Final   Culture result:  ?? Final   LIGHT MIXED SKIN  FLORA ISOLATED    Susceptibility     Enterobacter cloacae  complex Providencia rettgeri     MIC MIC    Amikacin ($) <=2 ug/mL S <=2 ug/mL S    Ampicillin ($)   >=32 ug/mL R    Ampicillin/sulbactam ($)   >=32 ug/mL R    Cefazolin ($) >=64 ug/mL R >=64 ug/mL R    Cefepime ($$) <=1 ug/mL S <=1 ug/mL S    Cefoxitin >=64 ug/mL R <=4 ug/mL S    Ceftazidime ($) <=1 ug/mL S <=1 ug/mL S    Ceftriaxone ($) <=1 ug/mL S <=1 ug/mL S    Ciprofloxacin ($) <=0.25 ug/mL S <=0.25 ug/mL S    Gentamicin ($) <=1 ug/mL S <=1 ug/mL S    Levofloxacin ($) <=0.12 ug/mL S <=0.12 ug/mL S    Meropenem ($$) <=0.25 ug/mL S <=0.25 ug/mL S    Piperacillin/Tazobac ($) <=4 ug/mL S <=4 ug/mL S    Tobramycin ($) 8 ug/mL I <=1 ug/mL S    Trimeth/Sulfa >=320 ug/mL R              Result History          Pathology:  08/12/2011  FINAL PATHOLOGIC DIAGNOSIS  1. Bone, left fifth metatarsal head, biopsy:  Acute osteomyelitis  2. Bone, left fifth metatarsal head, biopsy:  Acute osteomyelitis      Imaging:  CT RLE  ??  FINDINGS: The evaluation for infection is partially limited secondary to the  lack of IV contrast. There is an ulcer plantar to the second MTP joint measuring  17 x 20 mm. There is no evidence of a drainable fluid collection. There is no  subcutaneous emphysema.  ??  The bones are mildly osteopenic. There is dorsal subluxation at the first MTP  joint. There are extensive erosive changes involving the first MTP joint that  have a chronic appearance. The patient is status post fifth MTP arthrotomy. No  acute fracture or dislocation. No definite evidence of acute osteomyelitis.  ??  IMPRESSION  ??  1. Ulcer plantar to the second MTP joint. No evidence of a focal drainable fluid  collection.  2. Dorsal subluxation at the first MTP joint with erosive changes that appear  chronic. These findings may be related to inflammatory arthropathy, gout, or  prior septic arthritis. No definite evidence of acute osteomyelitis.    CT A/P  FINDINGS:   LOWER THORAX: Atelectasis at the lung bases  LIVER: Probable cyst left hepatic  lobe  BILIARY TREE: Stones in a nondistended gallbladder. CBD is not dilated.  SPLEEN: within normal limits.  PANCREAS: No focal abnormality.  ADRENALS: Unremarkable.  KIDNEYS/URETERS: Probable cyst left kidney but no hydronephrosis  STOMACH: Unremarkable.  SMALL BOWEL: No dilatation or wall thickening.  COLON: Diverticulosis of the left colon. No evidence of acute diverticulitis  APPENDIX: Surgically absent  PERITONEUM: No ascites or pneumoperitoneum.  RETROPERITONEUM: No lymphadenopathy or aortic aneurysm. There are vascular  calcifications  REPRODUCTIVE ORGANS: Not enlarged  URINARY BLADDER: Decompressed by Foley  BONES: No destructive bone lesion.  ABDOMINAL WALL: Umbilical hernia containing fat  ADDITIONAL COMMENTS: N/A  ??  IMPRESSION  ??  1. No hydronephrosis  2. Diverticulosis. No evidence of acute diverticulitis  3. Gallstones. No evidence of acute cholecystitis    CT head  ??  FINDINGS:  Intervals and cisterns are mildly enlarged compatible with the overall degree of  volume loss. Encephalomalacia right posterior frontal lobe and parietal lobe.  Mild low density  within the periventricular white matter. There is no  intracranial hemorrhage, extra-axial collection, or mass effect. The basilar  cisterns are open. No CT evidence of acute infarct.  ??  The bone windows demonstrate no abnormalities. Minimal sinus mucosal  inflammatory change.  ??  IMPRESSION    Encephalomalacia posterior left frontal and parietal lobe. No acute intracranial  Abnormality    06/02/19  Indication: Coffee-ground emesis, leukocytosis  ??  Comparison to earlier the same day. Portable exam obtained at [facility] demonstrates  minimal right lower lobe infiltrate, slightly increased compared to the prior  exam.  ??  IMPRESSION  Minimal right lower lobe infiltrate is slightly increased in the  interval.  ??    06/02/19  FINDINGS: Three views of the right foot demonstrate that is post surgical  correction of the distal fifth digit. Hyperextension at the  first MTP joint with  erosive change.. The soft tissues are within normal limits.  ??  IMPRESSION  ??  No definite plain film evidence for ostomy myelitis..    Assessment / Plan    Michael Lozano is a 85 year old gentleman with a history of diabetes, left diabetic foot infection, osteomyelitis status post left fifth metatarsal head resection in 2013, chronic right plantar wound diagnosed, indwelling cardiac device/pacemaker with SARS-CoV-2 on 05/18/2019. He was seen in the emergency room on 4/2 and 05/28/2019.  He presented with fever generalized body aches on 05/18/2019 and had a positive rapid SARS-CoV-2.  Appears he was discharged home and went back to the ER on 05/28/2019 with a chief complaint of fatigue and not eating or drinking.  He was brought to the emergency room by his grandson per the ER note with decline and very poor p.o. intake.  There was also concerns for periods of disorientation and confusion.  Initially found to be hyponatremic with a sodium of 163 and a creatinine of 6.6 and a BUN of 130.  Labs also showed elevated CK, leukocytosis in the range of 12-16, anemia, elevated inflammatory markers.  His urine on 06/02/2019 had 0-4 white cells and negative for bacteria.  Blood culture sent during this admission is negative.  Urine culture shows greater than 100,000 gram-negative rods.  Wound culture report in his foot wound from 05/17/19 resulted with Enterobacter cloacae and Providencia rettgeri, light mixed skin flora.  He has had imaging with an x-ray as well as a CT scan of the right lower extremity and reported as no evidence of focal drainable fluid collection and no evidence of acute osteomyelitis.  He was started on antibiotics which included vancomycin cefepime and Flagyl.      1) chronic right lower extremity plantar wound, DM foot wound, Wound cx  05/17/19 + for Enterobacter and Providentia  2)severe hypernatremia  3) AKI  4) COVID + 05/17/19 , 06/02/19, on room air   5)Concern for GI bleed and risk for  aspiration   6) DM        In regards to the right plantar wound, appears chronic without any cellulitis  Wound cultures from 05/17/2019 + for Enterobacter , Providentia for which he is on Cefepime.  Cefepime can cause akinetic seizures and altered mental status in the elderly and continue to monitor.  Seems clinically patient is improved and will continue for now  Continue flagyl given risk for aspiration and coverage    will hold vancomycin and monitor as there are no signs of cellulitis around the right plantar wound  Needs good wound care  Seen by podiatry  Supportive care for SARS-CoV-2  Hyponatremia improving  If he worsens clinically will need to re consider MRSA empiric coverage           Thank you for the opportunity to participate in the care of this patient.   Please contact with questions or concerns.      Genova Kiner R Lateefah Mallery, DO   1:51 PM

## 2019-06-04 NOTE — Progress Notes (Signed)
Physical Therapy Screening:    An InBasket screening referral was triggered for physical therapy based on results obtained during the nursing admission assessment.  The patient???s chart was reviewed and the patient is appropriate for a skilled therapy evaluation if there is a decline in functional mobility from baseline.  Please order a consult for physical therapy if you are in agreement and would like an evaluation to be completed.  Thank you.    Maggie Thornhill, PT

## 2019-06-04 NOTE — Progress Notes (Addendum)
Verbal shift change report given to Kayla RN (oncoming nurse) by Todd RN (offgoing nurse). Report included the following information SBAR, Kardex, Intake/Output, MAR, Recent Results, Med Rec Status, Alarm Parameters  and Dual Neuro Assessment.     1430: Wound care in room. Dressing wound, swabbed and culture sent off.       Verbal shift change report given to Todd RN (oncoming nurse) by Kayla RN (offgoing nurse). Report included the following information SBAR, Kardex, Intake/Output, MAR, Recent Results, Med Rec Status, Alarm Parameters  and Dual Neuro Assessment.

## 2019-06-04 NOTE — Wound Image (Signed)
WOCN Note:     New consult placed by RN for right plantar diabetic foot ulcer    Chart shows:  Patient admitted on 06/02/19 with AKI, GI bleed, positive for Covid 19  Past Medical History:   Diagnosis Date   ??? Chronic pain     diabetic neuropathy   ??? Diabetes mellitus type II, non insulin dependent ([place])    ??? Other ill-defined conditions(799.89)     Gout   ??? Thromboembolus ([place])      Wound Culture obtained on 06/04/19 with results pending  WBC = 13.2 on 06/04/19  Glucose = 150 on 06/04/19    Xray to right foot on 4/17 shows no definite plain film evidence for osteomyelitis. CT sacn to right foot on 4/18 shows no definite evidence of acute osteomyelitis.    Assessment:   A&O x 3, Appropriately conversational, forgetful  Reports no pain   Mobility: moderate assistance with repositioning.  Continence: Incontinent of stool,  has a Foley  Last Braden Score 13  Surface: Stryker in-touch mattress  Diet: diabetic consistent carb    Pt has consult with diabetic educator    Bilateral heel,buttocks, and sacral skin intact and without erythema   Heels offloaded with pillows     Lower Extremity Assessment:    Pulses: right foot with palpable pedal and posterior tibial pulse +1  Cap refill: <3sec  Temperature: warm  Insensate     1. POA right plantar diabetic foot ulcer   Full thickness   1.7 x 1.6 x 0.8 cm  Undermining from 12 to 12 o'clock with deepest measuring 1 cm at 12 o'clock   No bone palpated  Base is 80% smooth red tissue, 20% yellow slough   yellow exudate, moderate amount  no odor   unattached edges  Periwound with callus & without erythema  Wound cultured    Patient is being followed by podiatry    Wound Recommendations:      Cleanse wound to right plantar foot with normal saline or wound cleanser, pack undermining and wound bed with opticel ag rope, cover with gauze and kerlix daily    PI Prevention:  Turn/reposition approximately every 2 hours  Offload heels  with pillows at all times in bed.  Sacral Foam dressing: lift to assess regularly; change as needed. Discontinue if incontinent.   Hydraguard Barrier Cream to buttocks and sacrum TID  and as needed with incontinence care   Keep HOB 30 degrees or less to decrease shearing and pressure unless medically contraindicated. If HOB is to be over 30 degrees, raise knees first then [facility] to prevent sliding   Minimize layers of linen/pads under patient to optimize support surface to one  sheet and one incontinence pad     Discussed with NP, Juanetta Gosling and RN, Lonn Georgia    Teaching completed with:   [x]  Patient           []  Family member       []  Caregiver          []  Nursing  []  Other    Topics Discussed: wound care, offloading right foot, infection prevention    Patient/Caregiver Teaching:Level of patient/caregiver understanding able to:   []  Indicates understanding       [x]  Needs reinforcement  []  Unsuccessful      []  Verbal Understanding  []  Demonstrated understanding       []  No evidence of learning  []  Refused teaching         []   N/A    Transition of Care: Plan to follow weekly and as needed while admitted to hospital.     Jennye Boroughs BSN, RN, [facility], [facility]  Certified Wound and Ostomy Nurse  office [phone]  pager [phone] or call operator to page

## 2019-06-04 NOTE — Progress Notes (Signed)
Bedside shift change report given to Todd, RN (oncoming nurse) by Kayla, RN (offgoing nurse). Report included the following information SBAR, Kardex, Intake/Output, MAR, Recent Results and Cardiac Rhythm Paced.

## 2019-06-04 NOTE — Progress Notes (Signed)
Progress Notes by Corinna Lines, MD at 06/04/19 1413                Author: Corinna Lines, MD  Service: Gastroenterology  Author Type: Physician       Filed: 06/04/19 1817  Date of Service: 06/04/19 1413  Status: Addendum          Editor: Corinna Lines, MD (Physician)          Related Notes: Original Note by Redgie Grayer, NP (Nurse Practitioner) filed at 06/04/19 [place] [address]            GI PROGRESS NOTE   Octavia Heir, [place] office   [phone] NP in-hospital cell phone M-F until 4:30   After 5pm or on weekends, please call operator for physician on call         NAME: Michael Lozano    DOB:  1934-10-18    MRN:  [id number]            Subjective:     No GI complaints. Discussed with RN, no signs of GI blood loss.        Objective:        VITALS:    Last 24hrs VS reviewed since prior progress note. Most recent are:   Visit Vitals      BP  (!) 100/59     Pulse  86     Temp  96.9 ??F (36.1 ??C)     Resp  17     Ht  '5\' 10"'  (1.778 m)     Wt  76 kg (167 lb 8.8 oz)     SpO2  100%        BMI  24.04 kg/m??           PHYSICAL EXAM: evaluated through ICU window due to +COVID   General: No acute distress????   Neurologic:?? Alert     HEENT: EOMI, no scleral icterus    Lungs:  No increased WOB   Heart:   regular rate   Psych:???? Not anxious or agitated.      Lab Data Reviewed:         Recent Results (from the past 24 hour(s))     METABOLIC PANEL, COMPREHENSIVE          Collection Time: 06/03/19  3:05 PM         Result  Value  Ref Range            Sodium  158 (H)  136 - 145 mmol/L       Potassium  3.8  3.5 - 5.1 mmol/L       Chloride  128 (H)  97 - 108 mmol/L       CO2  21  21 - 32 mmol/L       Anion gap  9  5 - 15 mmol/L       Glucose  133 (H)  65 - 100 mg/dL       BUN  94 (H)  6 - 20 MG/DL       Creatinine  6.25 (H)  0.70 - 1.30 MG/DL       BUN/Creatinine ratio  15  12 - 20         GFR est AA  10 (L)  >60  ml/min/1.12m       GFR est non-AA  9 (L)  >60 ml/min/1.756m      Calcium  8.0 (L)  8.5 - 10.1 MG/DL       Bilirubin, total  1.0  0.2 - 1.0 MG/DL       ALT (SGPT)  20  12 - 78 U/L       AST (SGOT)  60 (H)  15 - 37 U/L       Alk. phosphatase  74  45 - 117 U/L       Protein, total  6.3 (L)  6.4 - 8.2 g/dL       Albumin  2.4 (L)  3.5 - 5.0 g/dL       Globulin  3.9  2.0 - 4.0 g/dL       A-G Ratio  0.6 (L)  1.1 - 2.2         CBC W/O DIFF          Collection Time: 06/03/19  3:05 PM         Result  Value  Ref Range            WBC  12.3 (H)  4.1 - 11.1 K/uL       RBC  3.61 (L)  4.10 - 5.70 M/uL       HGB  10.1 (L)  12.1 - 17.0 g/dL       HCT  31.6 (L)  36.6 - 50.3 %       MCV  87.5  80.0 - 99.0 FL       MCH  28.0  26.0 - 34.0 PG       MCHC  32.0  30.0 - 36.5 g/dL       RDW  16.6 (H)  11.5 - 14.5 %       PLATELET  250  150 - 400 K/uL       MPV  11.6  8.9 - 12.9 FL       NRBC  0.2 (H)  0 PER 100 WBC       ABSOLUTE NRBC  0.03 (H)  0.00 - 0.01 K/uL       VANCOMYCIN, RANDOM          Collection Time: 06/03/19  3:05 PM         Result  Value  Ref Range            Vancomycin, random  18.7  UG/ML       GLUCOSE, POC          Collection Time: 06/03/19  5:34 PM         Result  Value  Ref Range            Glucose (POC)  168 (H)  65 - 100 mg/dL       Performed by  STForsanPOC          Collection Time: 06/03/19 11:14 PM         Result  Value  Ref Range            Glucose (POC)  209 (H)  65 - 100 mg/dL       Performed by  BECorrie MckusickODD         METABOLIC PANEL, COMPREHENSIVE          Collection Time: 06/03/19 11:16 PM         Result  Value  Ref Range            Sodium  150 (H)  136 - 145 mmol/L       Potassium  4.4  3.5 - 5.1 mmol/L       Chloride  123 (H)  97 - 108 mmol/L       CO2  18 (L)  21 - 32 mmol/L       Anion gap  9  5 - 15 mmol/L       Glucose  203 (H)  65 - 100 mg/dL       BUN  82 (H)  6 - 20 MG/DL       Creatinine  5.53 (H)  0.70 - 1.30 MG/DL       BUN/Creatinine ratio  15  12 - 20         GFR est AA  12  (L)  >60 ml/min/1.82m       GFR est non-AA  10 (L)  >60 ml/min/1.760m      Calcium  8.4 (L)  8.5 - 10.1 MG/DL       Bilirubin, total  0.8  0.2 - 1.0 MG/DL       ALT (SGPT)  24  12 - 78 U/L       AST (SGOT)  71 (H)  15 - 37 U/L       Alk. phosphatase  82  45 - 117 U/L       Protein, total  7.3  6.4 - 8.2 g/dL       Albumin  2.4 (L)  3.5 - 5.0 g/dL       Globulin  4.9 (H)  2.0 - 4.0 g/dL       A-G Ratio  0.5 (L)  1.1 - 2.2         CBC W/O DIFF          Collection Time: 06/03/19 11:16 PM         Result  Value  Ref Range            WBC  16.0 (H)  4.1 - 11.1 K/uL       RBC  3.08 (L)  4.10 - 5.70 M/uL       HGB  8.7 (L)  12.1 - 17.0 g/dL       HCT  27.1 (L)  36.6 - 50.3 %       MCV  88.0  80.0 - 99.0 FL       MCH  28.2  26.0 - 34.0 PG       MCHC  32.1  30.0 - 36.5 g/dL       RDW  16.6 (H)  11.5 - 14.5 %       PLATELET  202  150 - 400 K/uL       MPV  12.1  8.9 - 12.9 FL       NRBC  0.0  0 PER 100 WBC       ABSOLUTE NRBC  0.00  0.00 - 0.01 K/uL       GLUCOSE, POC          Collection Time: 06/04/19  5:30 AM         Result  Value  Ref Range            Glucose (POC)  193 (H)  65 - 100 mg/dL       Performed by  BECorrie MckusickODD  METABOLIC PANEL, BASIC          Collection Time: 06/04/19  5:31 AM         Result  Value  Ref Range            Sodium  148 (H)  136 - 145 mmol/L       Potassium  3.8  3.5 - 5.1 mmol/L       Chloride  119 (H)  97 - 108 mmol/L       CO2  21  21 - 32 mmol/L       Anion gap  8  5 - 15 mmol/L       Glucose  196 (H)  65 - 100 mg/dL       BUN  76 (H)  6 - 20 MG/DL       Creatinine  4.95 (H)  0.70 - 1.30 MG/DL       BUN/Creatinine ratio  15  12 - 20              GFR est AA  14 (L)  >60 ml/min/1.81m            GFR est non-AA  11 (L)  >60 ml/min/1.71m      Calcium  7.8 (L)  8.5 - 10.1 MG/DL       CBC W/O DIFF          Collection Time: 06/04/19  5:31 AM         Result  Value  Ref Range            WBC  13.2 (H)  4.1 - 11.1 K/uL       RBC  3.18 (L)  4.10 - 5.70 M/uL       HGB  8.9 (L)  12.1 - 17.0 g/dL        HCT  27.4 (L)  36.6 - 50.3 %       MCV  86.2  80.0 - 99.0 FL       MCH  28.0  26.0 - 34.0 PG       MCHC  32.5  30.0 - 36.5 g/dL       RDW  16.4 (H)  11.5 - 14.5 %       PLATELET  214  150 - 400 K/uL       MPV  11.8  8.9 - 12.9 FL       NRBC  0.2 (H)  0 PER 100 WBC       ABSOLUTE NRBC  0.02 (H)  0.00 - 0.01 K/uL       LACTIC ACID          Collection Time: 06/04/19  5:31 AM         Result  Value  Ref Range            Lactic acid  2.8 (HH)  0.4 - 2.0 MMOL/L       VANCOMYCIN, RANDOM          Collection Time: 06/04/19  5:31 AM         Result  Value  Ref Range            Vancomycin, random  14.8  UG/ML       SED RATE (ESR)          Collection Time: 06/04/19  5:31 AM         Result  Value  Ref Range  Sed rate, automated  83 (H)  0 - 20 mm/hr       GLUCOSE, POC          Collection Time: 06/04/19 12:00 PM         Result  Value  Ref Range            Glucose (POC)  142 (H)  65 - 100 mg/dL       Performed by  Myer Haff RN         METABOLIC PANEL, COMPREHENSIVE          Collection Time: 06/04/19 12:47 PM         Result  Value  Ref Range            Sodium  147 (H)  136 - 145 mmol/L       Potassium  3.8  3.5 - 5.1 mmol/L       Chloride  118 (H)  97 - 108 mmol/L       CO2  23  21 - 32 mmol/L       Anion gap  6  5 - 15 mmol/L       Glucose  150 (H)  65 - 100 mg/dL       BUN  71 (H)  6 - 20 MG/DL       Creatinine  4.35 (H)  0.70 - 1.30 MG/DL       BUN/Creatinine ratio  16  12 - 20         GFR est AA  16 (L)  >60 ml/min/1.35m       GFR est non-AA  13 (L)  >60 ml/min/1.749m      Calcium  8.1 (L)  8.5 - 10.1 MG/DL       Bilirubin, total  0.7  0.2 - 1.0 MG/DL       ALT (SGPT)  24  12 - 78 U/L       AST (SGOT)  64 (H)  15 - 37 U/L       Alk. phosphatase  85  45 - 117 U/L       Protein, total  7.0  6.4 - 8.2 g/dL       Albumin  2.3 (L)  3.5 - 5.0 g/dL       Globulin  4.7 (H)  2.0 - 4.0 g/dL            A-G Ratio  0.5 (L)  1.1 - 2.2                      Assessment:     ??    Mild coffee grounds from NG tube: hgb 8.9   ??  AKI    ??  COVID 19   ??  Hypernatremia           Patient Active Problem List        Diagnosis  Code         ?  Cardiomyopathy ([facility] [place] I42.9     ?  CAD (coronary artery disease)  I25.10     ?  Hypernatremia  E87.0     ?  AKI (acute kidney injury) ([facility] N17.9         ?  GI bleed  K92.2          Plan:     ??    BID PPI   ??  Monitor CBC   ??  Continue with conservative management, will be available to see again on request             Signed By:  Redgie Grayer, NP        06/04/2019  2:13 PM           Agree with above   Will sign off

## 2019-06-04 NOTE — Progress Notes (Signed)
Physical Therapy Screening:    An InBasket screening referral was triggered for physical therapy based on results obtained during the nursing admission assessment.  The patient's chart was reviewed and the patient is appropriate for a skilled therapy evaluation if there is a decline in functional mobility from baseline.  Please order a consult for physical therapy if you are in agreement and would like an evaluation to be completed.  Thank you.    Maggie Thornhill, PT

## 2019-06-04 NOTE — Group Note (Signed)
Diabetes  Mgmt by Michael Lozano, CNS at 06/04/19 0809                Author: Mittie Lozano, CNS  Service: --  Author Type: Clinical Nurse Specialist       Filed: 06/04/19 0900  Date of Service: 06/04/19 0809  Status: Signed          Editor: Michael Lozano, CNS (Clinical Nurse Specialist)               [place]         Initial Presentation     Michael Lozano is a 85 y.o.  male with 3 week history of known COVID-19 viral infection who presented to the ED 06/02/19 with his grandson who reported a steady decline in  mentation and ADLs.  Blood work in ED significant for AKI, metabolic acidosis, leukocytosis with hypernatremia and admitted for medical management.        HX:      Past Medical History:        Diagnosis  Date         ?  Chronic pain            diabetic neuropathy         ?  Diabetes mellitus type II, non insulin dependent ([place])       ?  Other ill-defined conditions(799.89)            Gout         ?  Thromboembolus ([place])        PVD   Osteomyelitis      DX: AKI, Rhabdomyolysis, Metabolic Acidosis, UTI      TX: IV Fluids         Hospital course     Clinical progress has been uncomplicated.         Diabetes      Patient has known Type 2 diabetes, treated PTA.       Family history unknown for diabetes.       Admission BG 138 and A1c 6.2% indicate acceptable diabetes control.       Ambulatory blood glucose management provided by primary care provider: Elmer Picker, NP.      Consulted by Michael Lutes, NP for advanced diabetes nursing assessment and care, specifically  related to    '[x]'  Home management assessment         Diabetes-related medical history   Acute complications: None   Neurological complications   Peripheral neuropathy   Microvascular disease   Nephropathy   Macrovascular disease   Peripheral vascular disease and Foot wounds      Diabetes medication history        Drug class  Currently in use  Discontinued  Never used           Biguanide                DDP-4 inhibitor                 Sulfonylurea                Thiazolidinedione                GLP-1 RA                SGLT-2 inhibitors                Basal insulin  Bolus insulin                Fixed Dose  Combinations                Subjective     I don't have diabetes.      Nephrology following: HD on hold, gentle D5 IVF   Hypernatremia: Na 148 this am   GI consulted for coffee ground emesis: PPI, trending CBC   Leukocytosis, UA+ with GNR, Wound cx with light E.cloacae complex, Providencia rettgeri   Podiatry: Osteomyelitis R foot, CT pending   COVID: Decadron 48m x10 days      A1C 6.2%   Glucose 193-209 in the past 24 hrs   5 units correctional insulin    IV abox: flagyl, cefepime   WBC 13.2   GFR 11   Lac 2.8   COVID + on PCR and Rapid     Objective     Physical exam   General Alert, oriented, on HFNC      Visit Vitals      BP  (!) 123/58     Pulse  60     Temp  97.4 ??F (36.3 ??C)     Resp  14     Ht  '5\' 10"'  (1.778 m)     Wt  76 kg (167 lb 8.8 oz)     SpO2  100%        BMI  24.04 kg/m??        Deferred due to COVID-19       Laboratory           CBC W/O DIFF          Collection Time: 06/04/19  5:31 AM         Result  Value  Ref Range            WBC  13.2 (H)  4.1 - 11.1 K/uL       RBC  3.18 (L)  4.10 - 5.70 M/uL       HGB  8.9 (L)  12.1 - 17.0 g/dL       HCT  27.4 (L)  36.6 - 50.3 %       MCV  86.2  80.0 - 99.0 FL       MCH  28.0  26.0 - 34.0 PG       MCHC  32.5  30.0 - 36.5 g/dL       RDW  16.4 (H)  11.5 - 14.5 %       PLATELET  214  150 - 400 K/uL       MPV  11.8  8.9 - 12.9 FL       NRBC  0.2 (H)  0 PER 100 WBC            ABSOLUTE NRBC  0.02 (H)  0.00 - 0.01 K/uL                METABOLIC PANEL, COMPREHENSIVE          Collection Time: 06/03/19 11:16 PM         Result  Value  Ref Range            Sodium  150 (H)  136 - 145 mmol/L       Potassium  4.4  3.5 - 5.1 mmol/L       Chloride  123 (H)  97 - 108 mmol/L       CO2  18 (L)  21 - 32 mmol/L       Anion gap  9  5 - 15  mmol/L       Glucose  203 (H)  65 - 100 mg/dL       BUN  82 (H)  6 - 20 MG/DL       Creatinine  5.53 (H)  0.70 - 1.30 MG/DL       BUN/Creatinine ratio  15  12 - 20         GFR est AA  12 (L)  >60 ml/min/1.257m       GFR est non-AA  10 (L)  >60 ml/min/1.747m      Calcium  8.4 (L)  8.5 - 10.1 MG/DL       Bilirubin, total  0.8  0.2 - 1.0 MG/DL       ALT (SGPT)  24  12 - 78 U/L       AST (SGOT)  71 (H)  15 - 37 U/L       Alk. phosphatase  82  45 - 117 U/L       Protein, total  7.3  6.4 - 8.2 g/dL       Albumin  2.4 (L)  3.5 - 5.0 g/dL       Globulin  4.9 (H)  2.0 - 4.0 g/dL            A-G Ratio  0.5 (L)  1.1 - 2.2             Factors impacting BG management       Factor  Dose  Comments         Nutrition:   Carb-controlled meals        60 grams/meal              Drugs:   IV antibiotics      Decadron  IV antibiotics         Decadron 57m69maily (Day 2/10)                    Pain  Right foot pain           Infection: COVID-19             UTI  GNR       AKI  GFR 11, Baseline 38?           Other: Foot wound  Podiatry following   Wound cx with light E.cloacae complex, Providencia rettgeri          Blood glucose pattern                Assessment and Plan        Nursing Diagnosis  Risk for unstable blood glucose pattern     Nursing Intervention Domain  5250 Decision-making Support        Nursing Interventions  Examined current inpatient diabetes control    Explored factors facilitating and impeding inpatient management   Identified self-management practices impeding diabetes control   Explored corrective strategies with responsible inpatient provider           Evaluation     ArtChalmer Lozano an 84 11ar old gentleman with a history of Type 2 Diabetes admitted with known [id number] complicated by dehydration, AKI, UTI and right foot wound.  Little  is known about diabetes related history but A1C on admission 6.2% with glucose of 136 indicates adequate glucose control PTA.  I am unable to find history of antihyperglycemic  agents in previous documentation but patient has verbalized that he does not  take medications for diabetes.        Glucose now 200 due to: D5 IVF for hypernatremia management, decadron 64m once daily, AKI with GFR 11, UTI and foot infection.      As sodium improved to 148, can consider IVF adjustment to first address hyperglycemia.  Otherwise, he may temporarily need low dose basal insulin during acute illness.            Recommendations     1. Adjust to non-dextrose containing IVF once appropriate   2. Continue POC glucose ACHS with correctional insulin at HIGH sensitivity (ESRD)      3.  If off dextrose IVF and glucose remains over goal, adjust diet to consistent carbohydrate diet and Use of Subcutaneous Insulin Order set  ([id number]       Insulin  Dosing  Specific recommendation         Basal                                      (Based on weight, BMI & GFR)  '[x]'   0.15 units/kg/D (low dose)                  Billing Code(s)     '[x]'  [phone]      Before making these care recommendations, I personally reviewed the hosptialization record, including laboratory and diagnostic data, medications and examined the patient at bedside (circumstances permitting).   Total minutes:  [place] CNS   Diabetes Clinical Nurse Specialist   [facility]   Access via [employer]

## 2019-06-04 NOTE — Progress Notes (Signed)
Verbal shift change report given to Dorathy Daft RN (oncoming nurse) by Delena Serve (offgoing nurse). Report included the following information SBAR, Kardex, Intake/Output, MAR, Recent Results, Med Rec Status, Alarm Parameters  and Dual Neuro Assessment.     1430: Wound care in room. Dressing wound, swabbed and culture sent off.       Verbal shift change report given to Tawanna Cooler RN (oncoming nurse) by Dorathy Daft RN (offgoing nurse). Report included the following information SBAR, Kardex, Intake/Output, MAR, Recent Results, Med Rec Status, Alarm Parameters  and Dual Neuro Assessment.

## 2019-06-04 NOTE — Progress Notes (Signed)
Progress Notes by Nila Nephew, NP at 06/04/19 1225                Author: Nila Nephew, NP  Service: Nurse Practitioner  Author Type: Nurse Practitioner       Filed: 06/04/19 1608  Date of Service: 06/04/19 1225  Status: Signed          Editor: Nila Nephew, NP (Nurse Practitioner)                        [facility]      ICU TEAM Progress Note         Name:  Michael Lozano        DOB:  [birth year]     MRN:  [id number]        Date:  06/04/2019           Assessment:     Reason for ICU Admission: GIB   ??   HPI:   Sehaj??is a 85 y.o.????M with PMH of recent COVID, PM, ??DM2,  And overall is a very poor historian. Information for HPI is obtained via chart and provider.  Per ER note "He has had a gradual decline, very poor p.o. intake to the point  that he has not been eating or drinking for several days. ??He also is somnolent and less interactive. ??He has not fallen or had recurrent fevers. ??No vomiting, diarrhea, dyspnea." He apparently lives with is grandson who is 'never home'. While  in ER pt was found to be hypernatremic and hypotensive, ICU was consulted after coffee ground emesis          POD:   * No surgery found *      S/P:            Plan:        ICU Problems:   1.  AKI on CKD 3   - Place FC   -Cr 4.9 this am, baseline 1.7?   -HD per renal, not a need at this time   -IVF-D5W with sodium bicarb at per renal    -Urine output monitor closely,    - Nephrology consult   - Strict I and O    ??   2.  Dehydration   - IVF, sp 2.5 L    - Gentle resuscitation with IVF   -HD per renal   ??   3.  Hypernatremia    - Serial BMP Q6H    - Na 148-better   ??   4.  Hypothermia   - Matt Holmes hugger for normothermia    ??   5.  Ro COVID   -COVID +-see treatment below    -Recent covid 4/2 +   -On RA, cont to monitor, add supplemental oxygen to keep sats >92%   ??   6.  Coffee ground emesis    -Serial CBCs Q6h   -GI consult   -PPI BID    -Transfuse hgb <7   -NGT to LWS-minimal output    ??   7.  Leukocytosis     -Cultures pending   -Empiric abx therapy started    -UA+   -Urine culture pending, gram negative rods, await final    -Abx per ID    -MRSA pending    ??   8.  Lactic acidosis    - Trend LA   - Gentle hydration    ??   9.  Metabolic acidosis   -E9F +81OFB sodium Bicarb drip 138mL/hr per renal       10.Non healing wound on the RLE OM?    -ID consult, appreciate input    -Podiatry consulted, appreciate input   -Cont broad spectrum coverage       COVID Treatment:   a.  Vit C and Zinc once taking PO     b.  Was first diagnosed 4/2 with COVID PNA, not a candidate for remdesivir or toci    c.  Steroids decadron 6 mg Q24 X10 days       F - Feeding:  NPO dt coffee ground emesis, diet per GI   A - Analgesia: Fentanyl   S - Sedation: None   T - DVT Prophylaxis: SCD's or Sequential Compression Device    C - Code Status: Full Code   H - Head of Bed: > 30 Degrees   U - Ulcer Prophylaxis: Protonix (pantoprazole)    G - Glycemic Control: Insulin   S - Spontaneous Breathing Trial: No   B - Bowel Regimen: Docusate (Colace)   I - Indwelling Catheter:    Tubes: None   Lines: Peripheral IV   Drains: None   D - De-escalation of Antibiotics:per ID, appreciate input         Subjective:     Overnight Events:    06/04/2019              Objective:        Visit Vitals      BP  114/64 (BP 1 Location: Right upper arm, BP Patient Position: At rest)     Pulse  66     Temp  97.9 ??F (36.6 ??C)     Resp  14     Ht  5\' 10"  (1.778 m)     Wt  76 kg (167 lb 8.8 oz)     SpO2  99%        BMI  24.04 kg/m??         O2 Device: None (Room air) Temp (24hrs), Avg:97.8 ??F (36.6 ??C), Min:97.4 ??F (36.3 ??C), Max:98.1 ??F (36.7 ??C)               Hemodynamics:         PAP:     CO:        Wedge:     CI:        CVP:      SVR:                 PVR:           Ventilator Settings:          Mode  Rate  Tidal Volume  Pressure  FiO2  PEEP                                        Peak airway pressure:            Minute ventilation:               Intake/Output:       Intake/Output  Summary (Last 24 hours) at 06/04/2019 1225   Last data filed at 06/04/2019 1200     Gross per 24 hour        Intake  6350 ml        Output  3270 ml  Net  3080 ml           Physical Exam:   ??      General:   Lethargic, looks emaciated      Eyes:   Sclera anicteric. Pupils equally round and reactive to light.     Mouth/Throat:  Mucous membranes normal, oral pharynx clear     Neck:  Supple     Lungs:    Clear to auscultation bilaterally, good effort     CV:   Regular rate and rhythm,no murmur, click, rub or gallop     Abdomen:    Soft, non-tender. bowel sounds normal. non-distended     Extremities:  No cyanosis or edema     Skin:  Skin color, texture, turgor normal. no acute rash or lesions     Lymph nodes:  Cervical and supraclavicular normal     Musculoskeletal:  No swelling or deformity     Lines/Devices:   Intact, no erythema, drainage or tenderness     Neuro-  Alert to self and place, overall weak appearing                  Labs & Data: Reviewed      Medications: Reviewed      Chest X-Ray:      TTE:      Multidisciplinary Rounds Completed:  No      ABCDEF Bundle/Checklist Completed: Yes      SPECIAL EQUIPMENT: None      DISPOSITION: Stay in ICU      CRITICAL CARE CONSULTANT NOTE   I had a face to face encounter with the patient, reviewed and interpreted patient data including clinical events, labs, images, vital signs, I/O's, and examined patient.  I have discussed the case and  the plan and management of the patient's care with the consulting services, the bedside nurses and the respiratory therapist.        NOTE OF PERSONAL INVOLVEMENT IN CARE    This patient has a high probability of imminent, clinically significant deterioration, which requires the highest level of preparedness to intervene urgently. I participated in the decision-making and personally managed or directed the management of the  following life and organ supporting interventions that required my frequent assessment to treat or prevent  imminent deterioration.      I personally spent 60 minutes of critical care time.  This is time spent at this critically ill patient's bedside actively involved in patient care as well as the coordination of care and discussions  with the patient's family.  This does not include any procedural time which has been billed separately.      Nila Nephew, NP      [facility]   06/04/2019

## 2019-06-04 NOTE — Progress Notes (Signed)
Bedside shift change report given to Tawanna Cooler, RN (oncoming nurse) by Dorathy Daft, RN (offgoing nurse). Report included the following information SBAR, Kardex, Intake/Output, MAR, Recent Results and Cardiac Rhythm Paced.

## 2019-06-04 NOTE — Progress Notes (Signed)
Podiatry  -Pt seen through ICU window.  Pt resting and in no apparent distress.   -CT right lower extremity completed but not read.  Per my interpretation, CT scan does not show osteo along submet 2 region.  Awaiting official read from radiologist.   -Continue antibxs per ID.    -If CT scan negative, no surgical intervention needed from my standpoint.   -Will follow    Addendum: CT negative for osteo.  No surgical intervention needed from my standpoint.  Recommend wound care and short course of antibxs.  Will sign off.  Reconsult PRN.

## 2019-06-04 NOTE — Progress Notes (Signed)
Infectious Disease Progress Note       Subjective:     Michael Lozano seen  He says he has had foot wound for "2 years". Says sensation is not good in feet  Patient denied fever, chills, nausea, vomiting diarrhea  Says he has not been walking at home  Denied  Cough or shortness of breath  Asking to have NG tube out and lemonade to drink     Discussed with ICU team            Objective:    Vitals:   Patient Vitals for the past 24 hrs:   Temp Pulse Resp BP SpO2   06/04/19 1200 96.9 ??F (36.1 ??C) 66 14 114/64 99 %   06/04/19 1100 ??? 60 10 (!) 96/46 99 %   06/04/19 1000 ??? 62 12 (!) 117/49 99 %   06/04/19 0900 ??? 81 18 100/67 100 %   06/04/19 0800 97.9 ??F (36.6 ??C) 61 12 (!) 117/52 100 %   06/04/19 0700 ??? 60 14 ??? 100 %   06/04/19 0600 ??? 62 21 (!) 123/58 99 %   06/04/19 0500 ??? 60 13 (!) 107/49 100 %   06/04/19 0400 97.4 ??F (36.3 ??C) 62 14 (!) 106/50 100 %   06/04/19 0300 ??? 63 13 (!) 108/45 99 %   06/04/19 0200 ??? 70 14 (!) 112/57 100 %   06/04/19 0100 ??? 64 12 (!) 110/57 100 %   06/04/19 0000 97.7 ??F (36.5 ??C) 69 16 (!) 105/55 100 %   06/03/19 2300 ??? 60 12 (!) 109/52 100 %   06/03/19 2200 ??? 63 14 (!) 96/49 100 %   06/03/19 2100 ??? 60 11 (!) 112/55 100 %   06/03/19 2000 98.1 ??F (36.7 ??C) 63 14 (!) 103/51 100 %   06/03/19 1900 ??? 71 25 (!) 110/59 99 %   06/03/19 1800 ??? 73 13 (!) 98/52 99 %   06/03/19 1700 97.9 ??F (36.6 ??C) 74 23 (!) 104/55 99 %   06/03/19 1600 ??? 76 21 (!) 94/44 99 %   06/03/19 1510 ??? 81 17 (!) 99/40 100 %   06/03/19 1500 ??? 79 17 (!) 77/51 100 %   06/03/19 1400 ??? 65 18 (!) 99/49 100 %       Physical Exam:  Gen: No apparent distress  HEENT:   + NG, no  scleral icterus, no thrush,  CV: S1,2 heard regularly on monitor  Lungs: non labored breathing   Abdomen: soft, non tender,   Skin: no rash on face, extremities   Psych:  non tearful  Neuro: awake, alert, knew his name, knew he was at [facility], no tremors   Musculoskeletal:  + R plantar wound, no surrounding erythema, no discharge, + chronic venous discoloration seen on  feet     Medications:    Current Facility-Administered Medications:   ???  HYDROmorphone (PF) (DILAUDID) injection 0.5 mg, 0.5 mg, IntraVENous, Q4H PRN, Lockard, Sara H, NP, 0.5 mg at 06/04/19 1012  ???  [START ON 06/05/2019] docusate (COLACE) 50 mg/5 mL oral liquid 100 mg, 100 mg, Oral, DAILY, Lockard, Sara H, NP  ???  albuterol (PROVENTIL HFA, VENTOLIN HFA, PROAIR HFA) inhaler 1 Puff, 1 Puff, Inhalation, Q4H PRN, Erline Levine, NP  ???  glucose chewable tablet 16 g, 4 Tab, Oral, PRN, Erline Levine, NP  ???  dextrose (D50W) injection syrg 12.5-25 g, 25-50 mL, IntraVENous, PRN, Erline Levine, NP  ???  glucagon (  GLUCAGEN) injection 1 mg, 1 mg, IntraMUSCular, PRN, Erline Levine, NP  ???  insulin lispro (HUMALOG) injection, , SubCUTAneous, Q6H, Erline Levine, NP, 2 Units at 06/04/19 1232  ???  pantoprazole (PROTONIX) 40 mg in 0.9% sodium chloride 10 mL injection, 40 mg, IntraVENous, Q12H, Paula Libra, MD, 40 mg at 06/04/19 0830  ???  acetaminophen (TYLENOL) tablet 650 mg, 650 mg, Oral, Q6H PRN **OR** acetaminophen (TYLENOL) suppository 650 mg, 650 mg, Rectal, Q6H PRN, Lockard, Remi Deter, NP  ???  zinc sulfate (ZINCATE) 50 mg zinc (220 mg) capsule 1 Cap, 1 Cap, Oral, DAILY, Lockard, Remi Deter, NP, 1 Cap at 06/04/19 0831  ???  cefepime (MAXIPIME) 1 g in 0.9% sodium chloride (MBP/ADV) 50 mL MBP, 1 g, IntraVENous, Q24H, Sinnatamby, Diane S, MD  ???  metroNIDAZOLE (FLAGYL) IVPB premix 500 mg, 500 mg, IntraVENous, Q12H, Sinnatamby, Diane S, MD, Last Rate: 100 mL/hr at 06/04/19 0834, 500 mg at 06/04/19 0834  ???  acetaminophen (TYLENOL) tablet 650 mg, 650 mg, Oral, Q6H, Sinnatamby, Diane S, MD, 650 mg at 06/04/19 1158  ???  sodium bicarbonate (8.4%) 50 mEq in dextrose 5% 1,000 mL infusion, , IntraVENous, CONTINUOUS, Patel, Deep V, MD, Last Rate: 100 mL/hr at 06/04/19 1238, Rate Change at 06/04/19 1238  ???  sodium chloride (NS) flush 5-40 mL, 5-40 mL, IntraVENous, Q8H, Lockard, Sara H, NP, 10 mL at 06/04/19 6045  ???  sodium chloride (NS) flush 5-40 mL, 5-40  mL, IntraVENous, PRN, Jessie Foot, Remi Deter, NP  ???  polyethylene glycol (MIRALAX) packet 17 g, 17 g, Oral, DAILY PRN, Jessie Foot, Remi Deter, NP  ???  promethazine (PHENERGAN) tablet 12.5 mg, 12.5 mg, Oral, Q6H PRN **OR** ondansetron (ZOFRAN) injection 4 mg, 4 mg, IntraVENous, Q6H PRN, Arta Silence, NP, 4 mg at 06/02/19 1918  ???  Vancomycin - pharmacy to dose, , Other, Rx Dosing/Monitoring, Lockard, Remi Deter, NP      Labs:  Recent Results (from the past 24 hour(s))   METABOLIC PANEL, COMPREHENSIVE    Collection Time: 06/03/19  3:05 PM   Result Value Ref Range    Sodium 158 (H) 136 - 145 mmol/L    Potassium 3.8 3.5 - 5.1 mmol/L    Chloride 128 (H) 97 - 108 mmol/L    CO2 21 21 - 32 mmol/L    Anion gap 9 5 - 15 mmol/L    Glucose 133 (H) 65 - 100 mg/dL    BUN 94 (H) 6 - 20 MG/DL    Creatinine 6.25 (H) 0.70 - 1.30 MG/DL    BUN/Creatinine ratio 15 12 - 20      GFR est AA 10 (L) >60 ml/min/1.21m    GFR est non-AA 9 (L) >60 ml/min/1.746m   Calcium 8.0 (L) 8.5 - 10.1 MG/DL    Bilirubin, total 1.0 0.2 - 1.0 MG/DL    ALT (SGPT) 20 12 - 78 U/L    AST (SGOT) 60 (H) 15 - 37 U/L    Alk. phosphatase 74 45 - 117 U/L    Protein, total 6.3 (L) 6.4 - 8.2 g/dL    Albumin 2.4 (L) 3.5 - 5.0 g/dL    Globulin 3.9 2.0 - 4.0 g/dL    A-G Ratio 0.6 (L) 1.1 - 2.2     CBC W/O DIFF    Collection Time: 06/03/19  3:05 PM   Result Value Ref Range    WBC 12.3 (H) 4.1 - 11.1 K/uL    RBC 3.61 (L) 4.10 -  5.70 M/uL    HGB 10.1 (L) 12.1 - 17.0 g/dL    HCT 31.6 (L) 36.6 - 50.3 %    MCV 87.5 80.0 - 99.0 FL    MCH 28.0 26.0 - 34.0 PG    MCHC 32.0 30.0 - 36.5 g/dL    RDW 16.6 (H) 11.5 - 14.5 %    PLATELET 250 150 - 400 K/uL    MPV 11.6 8.9 - 12.9 FL    NRBC 0.2 (H) 0 PER 100 WBC    ABSOLUTE NRBC 0.03 (H) 0.00 - 0.01 K/uL   VANCOMYCIN, RANDOM    Collection Time: 06/03/19  3:05 PM   Result Value Ref Range    Vancomycin, random 18.7 UG/ML   GLUCOSE, POC    Collection Time: 06/03/19  5:34 PM   Result Value Ref Range    Glucose (POC) 168 (H) 65 - 100 mg/dL    Performed by  Larey Dresser MELISSA    GLUCOSE, POC    Collection Time: 06/03/19 11:14 PM   Result Value Ref Range    Glucose (POC) 209 (H) 65 - 100 mg/dL    Performed by BENOIT TODD    METABOLIC PANEL, COMPREHENSIVE    Collection Time: 06/03/19 11:16 PM   Result Value Ref Range    Sodium 150 (H) 136 - 145 mmol/L    Potassium 4.4 3.5 - 5.1 mmol/L    Chloride 123 (H) 97 - 108 mmol/L    CO2 18 (L) 21 - 32 mmol/L    Anion gap 9 5 - 15 mmol/L    Glucose 203 (H) 65 - 100 mg/dL    BUN 82 (H) 6 - 20 MG/DL    Creatinine 5.53 (H) 0.70 - 1.30 MG/DL    BUN/Creatinine ratio 15 12 - 20      GFR est AA 12 (L) >60 ml/min/1.35m    GFR est non-AA 10 (L) >60 ml/min/1.771m   Calcium 8.4 (L) 8.5 - 10.1 MG/DL    Bilirubin, total 0.8 0.2 - 1.0 MG/DL    ALT (SGPT) 24 12 - 78 U/L    AST (SGOT) 71 (H) 15 - 37 U/L    Alk. phosphatase 82 45 - 117 U/L    Protein, total 7.3 6.4 - 8.2 g/dL    Albumin 2.4 (L) 3.5 - 5.0 g/dL    Globulin 4.9 (H) 2.0 - 4.0 g/dL    A-G Ratio 0.5 (L) 1.1 - 2.2     CBC W/O DIFF    Collection Time: 06/03/19 11:16 PM   Result Value Ref Range    WBC 16.0 (H) 4.1 - 11.1 K/uL    RBC 3.08 (L) 4.10 - 5.70 M/uL    HGB 8.7 (L) 12.1 - 17.0 g/dL    HCT 27.1 (L) 36.6 - 50.3 %    MCV 88.0 80.0 - 99.0 FL    MCH 28.2 26.0 - 34.0 PG    MCHC 32.1 30.0 - 36.5 g/dL    RDW 16.6 (H) 11.5 - 14.5 %    PLATELET 202 150 - 400 K/uL    MPV 12.1 8.9 - 12.9 FL    NRBC 0.0 0 PER 100 WBC    ABSOLUTE NRBC 0.00 0.00 - 0.01 K/uL   GLUCOSE, POC    Collection Time: 06/04/19  5:30 AM   Result Value Ref Range    Glucose (POC) 193 (H) 65 - 100 mg/dL    Performed by BECorrie MckusickODD    METABOLIC PANEL, BASIC  Collection Time: 06/04/19  5:31 AM   Result Value Ref Range    Sodium 148 (H) 136 - 145 mmol/L    Potassium 3.8 3.5 - 5.1 mmol/L    Chloride 119 (H) 97 - 108 mmol/L    CO2 21 21 - 32 mmol/L    Anion gap 8 5 - 15 mmol/L    Glucose 196 (H) 65 - 100 mg/dL    BUN 76 (H) 6 - 20 MG/DL    Creatinine 4.95 (H) 0.70 - 1.30 MG/DL    BUN/Creatinine ratio 15 12 - 20      GFR est AA 14  (L) >60 ml/min/1.72m    GFR est non-AA 11 (L) >60 ml/min/1.731m   Calcium 7.8 (L) 8.5 - 10.1 MG/DL   CBC W/O DIFF    Collection Time: 06/04/19  5:31 AM   Result Value Ref Range    WBC 13.2 (H) 4.1 - 11.1 K/uL    RBC 3.18 (L) 4.10 - 5.70 M/uL    HGB 8.9 (L) 12.1 - 17.0 g/dL    HCT 27.4 (L) 36.6 - 50.3 %    MCV 86.2 80.0 - 99.0 FL    MCH 28.0 26.0 - 34.0 PG    MCHC 32.5 30.0 - 36.5 g/dL    RDW 16.4 (H) 11.5 - 14.5 %    PLATELET 214 150 - 400 K/uL    MPV 11.8 8.9 - 12.9 FL    NRBC 0.2 (H) 0 PER 100 WBC    ABSOLUTE NRBC 0.02 (H) 0.00 - 0.01 K/uL   LACTIC ACID    Collection Time: 06/04/19  5:31 AM   Result Value Ref Range    Lactic acid 2.8 (HH) 0.4 - 2.0 MMOL/L   VANCOMYCIN, RANDOM    Collection Time: 06/04/19  5:31 AM   Result Value Ref Range    Vancomycin, random 14.8 UG/ML   SED RATE (ESR)    Collection Time: 06/04/19  5:31 AM   Result Value Ref Range    Sed rate, automated 83 (H) 0 - 20 mm/hr   GLUCOSE, POC    Collection Time: 06/04/19 12:00 PM   Result Value Ref Range    Glucose (POC) 142 (H) 65 - 100 mg/dL    Performed by OdMyer HaffN            Micro:     Blood: 06/02/19  Special Requests: NO SPECIAL REQUESTS  ?? Preliminary   Culture result: NO GROWTH 2 DAYS              Urine:   Specimen Information: Cath Urine   ??    Component Value Ref Range & Units Status   Special Requests: NO SPECIAL REQUESTS  ?? Preliminary   Colony Count >100,000   COLONIES/mL  ?? Preliminary   Culture result: GRAM NEGATIVE RODSAbnormal           05/17/19 Wound   Foot; Wound    ??    Component Value Ref Range & Units Status   Special Requests: NO SPECIAL REQUESTS  ?? Final   GRAM STAIN NO WBC'S SEEN  ?? Final   GRAM STAIN FEW GRAM POSITIVE COCCI  ?? Final   GRAM STAIN FEW GRAM POSITIVE RODS  ?? Final   Culture result: Abnormal   ?? Final   LIGHT ENTEROBACTER CLOACAE COMPLEX    Culture result: LIGHT PROVIDENCIA RETTGERIAbnormal   ?? Final   Culture result:  ?? Final   LIGHT MIXED SKIN  FLORA ISOLATED    Susceptibility     Enterobacter cloacae  complex Providencia rettgeri     MIC MIC    Amikacin ($) <=2 ug/mL S <=2 ug/mL S    Ampicillin ($)   >=32 ug/mL R    Ampicillin/sulbactam ($)   >=32 ug/mL R    Cefazolin ($) >=64 ug/mL R >=64 ug/mL R    Cefepime ($$) <=1 ug/mL S <=1 ug/mL S    Cefoxitin >=64 ug/mL R <=4 ug/mL S    Ceftazidime ($) <=1 ug/mL S <=1 ug/mL S    Ceftriaxone ($) <=1 ug/mL S <=1 ug/mL S    Ciprofloxacin ($) <=0.25 ug/mL S <=0.25 ug/mL S    Gentamicin ($) <=1 ug/mL S <=1 ug/mL S    Levofloxacin ($) <=0.12 ug/mL S <=0.12 ug/mL S    Meropenem ($$) <=0.25 ug/mL S <=0.25 ug/mL S    Piperacillin/Tazobac ($) <=4 ug/mL S <=4 ug/mL S    Tobramycin ($) 8 ug/mL I <=1 ug/mL S    Trimeth/Sulfa >=320 ug/mL R              Result History          Pathology:  08/12/2011  FINAL PATHOLOGIC DIAGNOSIS  1. Bone, left fifth metatarsal head, biopsy:  Acute osteomyelitis  2. Bone, left fifth metatarsal head, biopsy:  Acute osteomyelitis      Imaging:  CT RLE  ??  FINDINGS: The evaluation for infection is partially limited secondary to the  lack of IV contrast. There is an ulcer plantar to the second MTP joint measuring  17 x 20 mm. There is no evidence of a drainable fluid collection. There is no  subcutaneous emphysema.  ??  The bones are mildly osteopenic. There is dorsal subluxation at the first MTP  joint. There are extensive erosive changes involving the first MTP joint that  have a chronic appearance. The patient is status post fifth MTP arthrotomy. No  acute fracture or dislocation. No definite evidence of acute osteomyelitis.  ??  IMPRESSION  ??  1. Ulcer plantar to the second MTP joint. No evidence of a focal drainable fluid  collection.  2. Dorsal subluxation at the first MTP joint with erosive changes that appear  chronic. These findings may be related to inflammatory arthropathy, gout, or  prior septic arthritis. No definite evidence of acute osteomyelitis.    CT A/P  FINDINGS:   LOWER THORAX: Atelectasis at the lung bases  LIVER: Probable cyst left hepatic  lobe  BILIARY TREE: Stones in a nondistended gallbladder. CBD is not dilated.  SPLEEN: within normal limits.  PANCREAS: No focal abnormality.  ADRENALS: Unremarkable.  KIDNEYS/URETERS: Probable cyst left kidney but no hydronephrosis  STOMACH: Unremarkable.  SMALL BOWEL: No dilatation or wall thickening.  COLON: Diverticulosis of the left colon. No evidence of acute diverticulitis  APPENDIX: Surgically absent  PERITONEUM: No ascites or pneumoperitoneum.  RETROPERITONEUM: No lymphadenopathy or aortic aneurysm. There are vascular  calcifications  REPRODUCTIVE ORGANS: Not enlarged  URINARY BLADDER: Decompressed by Foley  BONES: No destructive bone lesion.  ABDOMINAL WALL: Umbilical hernia containing fat  ADDITIONAL COMMENTS: N/A  ??  IMPRESSION  ??  1. No hydronephrosis  2. Diverticulosis. No evidence of acute diverticulitis  3. Gallstones. No evidence of acute cholecystitis    CT head  ??  FINDINGS:  Intervals and cisterns are mildly enlarged compatible with the overall degree of  volume loss. Encephalomalacia right posterior frontal lobe and parietal lobe.  Mild low density  within the periventricular white matter. There is no  intracranial hemorrhage, extra-axial collection, or mass effect. The basilar  cisterns are open. No CT evidence of acute infarct.  ??  The bone windows demonstrate no abnormalities. Minimal sinus mucosal  inflammatory change.  ??  IMPRESSION    Encephalomalacia posterior left frontal and parietal lobe. No acute intracranial  Abnormality    06/02/19  Indication: Coffee-ground emesis, leukocytosis  ??  Comparison to earlier the same day. Portable exam obtained at [facility] demonstrates  minimal right lower lobe infiltrate, slightly increased compared to the prior  exam.  ??  IMPRESSION  Minimal right lower lobe infiltrate is slightly increased in the  interval.  ??    06/02/19  FINDINGS: Three views of the right foot demonstrate that is post surgical  correction of the distal fifth digit. Hyperextension at the  first MTP joint with  erosive change.. The soft tissues are within normal limits.  ??  IMPRESSION  ??  No definite plain film evidence for ostomy myelitis..    Assessment / Plan    Michael Cairns is a 85 year old gentleman with a history of diabetes, left diabetic foot infection, osteomyelitis status post left fifth metatarsal head resection in 2013, chronic right plantar wound diagnosed, indwelling cardiac device/pacemaker with SARS-CoV-2 on 05/18/2019. He was seen in the emergency room on 4/2 and 05/28/2019.  He presented with fever generalized body aches on 05/18/2019 and had a positive rapid SARS-CoV-2.  Appears he was discharged home and went back to the ER on 05/28/2019 with a chief complaint of fatigue and not eating or drinking.  He was brought to the emergency room by his grandson per the ER note with decline and very poor p.o. intake.  There was also concerns for periods of disorientation and confusion.  Initially found to be hyponatremic with a sodium of 163 and a creatinine of 6.6 and a BUN of 130.  Labs also showed elevated CK, leukocytosis in the range of 12-16, anemia, elevated inflammatory markers.  His urine on 06/02/2019 had 0-4 white cells and negative for bacteria.  Blood culture sent during this admission is negative.  Urine culture shows greater than 100,000 gram-negative rods.  Wound culture report in his foot wound from 05/17/19 resulted with Enterobacter cloacae and Providencia rettgeri, light mixed skin flora.  He has had imaging with an x-ray as well as a CT scan of the right lower extremity and reported as no evidence of focal drainable fluid collection and no evidence of acute osteomyelitis.  He was started on antibiotics which included vancomycin cefepime and Flagyl.      1) chronic right lower extremity plantar wound, DM foot wound, Wound cx  05/17/19 + for Enterobacter and Providentia  2)severe hypernatremia  3) AKI  4) COVID + 05/17/19 , 06/02/19, on room air   5)Concern for GI bleed and risk for  aspiration   6) DM        In regards to the right plantar wound, appears chronic without any cellulitis  Wound cultures from 05/17/2019 + for Enterobacter , Providentia for which he is on Cefepime.  Cefepime can cause akinetic seizures and altered mental status in the elderly and continue to monitor.  Seems clinically patient is improved and will continue for now  Continue flagyl given risk for aspiration and coverage    will hold vancomycin and monitor as there are no signs of cellulitis around the right plantar wound  Needs good wound care  Seen by podiatry  Supportive care for SARS-CoV-2  Hyponatremia improving  If he worsens clinically will need to re consider MRSA empiric coverage           Thank you for the opportunity to participate in the care of this patient.   Please contact with questions or concerns.      Genova Kiner R Lateefah Mallery, DO   1:51 PM

## 2019-06-04 NOTE — Progress Notes (Signed)
Progress  Notes by Talbert Forest, MD at 06/04/19 1235                Author: Talbert Forest, MD  Service: Nephrology  Author Type: Physician       Filed: 06/04/19 1240  Date of Service: 06/04/19 1235  Status: Signed          Editor: Talbert Forest, MD (Physician)                                                                                                 NAME: Michael Lozano         DOB:  08-11-1934         MRN:  [id number]            ??               Assessment   :                                                Plan:      AKI on CKD stage 3 (creatinine~ 1.7 at baseline?)   Rhabdomyolysis   Severe Hypernatremia   Covid   Hypotension   Metabolic acidosis   Hypercalcemia   UTI  Creatinine showing improvement (peak 7.7mg /dl); Down to 4.9mg /dl. Non-oliguric;  prerenal +/- ATN      Na improving to 148-> drop over last 26hrs (avg 0.55meq/hr so  okay).      Lets stop IV D5W and decrease D5W +9meq Sodium Bicarbonate to 100cc/hr   ??   CK 1862 to 1372      ??   Holding Ramipril and statin for now      Discussed with ICU team             Subjective:        Chief Complaint:  Seen through ICU window (to limit spread of covid and conserve ppe). I spoke with ICU team during their rounds. UOP 2.6L yesterday. Remains on RA.      Review of Systems:              Symptom  Y/N  Comments    Symptom  Y/N  Comments             Fever/Chills        Chest Pain                 Poor Appetite        Edema                 Cough        Abdominal Pain         Sputum        Joint Pain         SOB/DOE        Pruritis/Rash         Nausea/vomit        Tolerating PT/OT  Diarrhea        Tolerating Diet                 Constipation        Other               Could not obtain due to:            Objective:        VITALS:    Last 24hrs VS reviewed since prior progress note. Most recent are:   Visit Vitals      BP  114/64 (BP 1 Location: Right upper arm, BP Patient Position: At rest)     Pulse  66     Temp  96.9 ??F (36.1 ??C)     Resp  14      Ht  5\' 10"  (1.778 m)     Wt  76 kg (167 lb 8.8 oz)     SpO2  99%        BMI  24.04 kg/m??           Intake/Output Summary (Last 24 hours) at 06/04/2019 1235   Last data filed at 06/04/2019 1200     Gross per 24 hour        Intake  6350 ml        Output  3270 ml        Net  3080 ml         Telemetry Reviewed:       PHYSICAL EXAM:   General: NAD         Lab Data Reviewed: (see below)      Medications Reviewed: (see below)      PMH/SH reviewed - no change compared to H&P   ________________________________________________________________________   Care Plan discussed with:       Patient             Family              RN             Care Manager                            Consultant:                     Comments         >50% of visit spent in counseling and coordination of care            ________________________________________________________________________   Timothea Bodenheimer 06/06/2019, MD       Procedures: see electronic medical records for all procedures/Xrays and details which   were not copied into this note but were reviewed prior to creation of Plan.        LABS:     Recent Labs            06/04/19   0531  06/03/19   2316     WBC  13.2*  16.0*     HGB  8.9*  8.7*     HCT  27.4*  27.1*         PLT  214  202          Recent Labs                06/04/19   0531  06/03/19   2316  06/03/19   1505  06/03/19   0303  06/03/19   0303  06/02/19   2018     NA  148*  150*  158*    < >  161*  163*     K  3.8  4.4  3.8    < >  4.5  4.9     CL  119*  123*  128*    < >  134*  137*     CO2  21  18*  21    < >  16*  17*     BUN  76*  82*  94*    < >  104*  110*     CREA  4.95*  5.53*  6.25*    < >  6.81*  6.60*     GLU  196*  203*  133*    < >  154*  130*     CA  7.8*  8.4*  8.0*    < >  8.3*  8.8     MG   --    --    --    --   3.4*  3.4*     PHOS   --    --    --    --   3.7  4.6        < > = values in this interval not displayed.          Recent Labs             06/03/19   2316  06/03/19   1505  06/03/19   0847     AP  82  74  85     TP  7.3   6.3*  6.4     ALB  2.4*  2.4*  2.6*          GLOB  4.9*  3.9  3.8          Recent Labs           06/03/19   0303        APTT  29.6         No results for input(s): FE, TIBC, PSAT, FERR in the last 72 hours.    No results found for: FOL, RBCF      Recent Labs            06/03/19   0431  06/02/19   2015     PH  7.37  7.32*     PCO2  27*  29*         PO2  93  91          Recent Labs            06/03/19   0303  06/02/19   1415         CPK  1,372*  1,862*        No components found for: [facility]     Lab Results         Component  Value  Date/Time            Color  DARK YELLOW  06/02/2019 01:00 PM       Appearance  CLOUDY (A)  06/02/2019 01:00 PM       Specific gravity  1.023  06/02/2019 01:00 PM       pH (UA)  5.0  06/02/2019 01:00 PM       Protein  Negative  06/02/2019 01:00 PM       Glucose  Negative  06/02/2019  01:00 PM       Ketone  TRACE (A)  06/02/2019 01:00 PM       Bilirubin  NEGATIVE   08/09/2011 11:25 PM       Urobilinogen  0.2  06/02/2019 01:00 PM       Nitrites  Positive (A)  06/02/2019 01:00 PM       Leukocyte Esterase  SMALL (A)  06/02/2019 01:00 PM       Epithelial cells  FEW  06/02/2019 01:00 PM       Bacteria  Negative  06/02/2019 01:00 PM       WBC  0-4  06/02/2019 01:00 PM            RBC  0-5  06/02/2019 01:00 PM           MEDICATIONS:     Current Facility-Administered Medications          Medication  Dose  Route  Frequency           ?  HYDROmorphone (PF) (DILAUDID) injection 0.5 mg   0.5 mg  IntraVENous  Q4H PRN     ?  [START ON 06/05/2019] docusate (COLACE) 50 mg/5 mL oral liquid 100 mg   100 mg  Oral  DAILY     ?  albuterol (PROVENTIL HFA, VENTOLIN HFA, PROAIR HFA) inhaler 1 Puff   1 Puff  Inhalation  Q4H PRN     ?  glucose chewable tablet 16 g   4 Tab  Oral  PRN     ?  dextrose (D50W) injection syrg 12.5-25 g   25-50 mL  IntraVENous  PRN     ?  glucagon (GLUCAGEN) injection 1 mg   1 mg  IntraMUSCular  PRN     ?  insulin lispro (HUMALOG) injection     SubCUTAneous  Q6H     ?  pantoprazole (PROTONIX) 40  mg in 0.9% sodium chloride 10 mL injection   40 mg  IntraVENous  Q12H     ?  acetaminophen (TYLENOL) tablet 650 mg   650 mg  Oral  Q6H PRN          Or           ?  acetaminophen (TYLENOL) suppository 650 mg   650 mg  Rectal  Q6H PRN     ?  zinc sulfate (ZINCATE) 50 mg zinc (220 mg) capsule 1 Cap   1 Cap  Oral  DAILY     ?  cefepime (MAXIPIME) 1 g in 0.9% sodium chloride (MBP/ADV) 50 mL MBP   1 g  IntraVENous  Q24H     ?  metroNIDAZOLE (FLAGYL) IVPB premix 500 mg   500 mg  IntraVENous  Q12H     ?  acetaminophen (TYLENOL) tablet 650 mg   650 mg  Oral  Q6H     ?  sodium bicarbonate (8.4%) 50 mEq in dextrose 5% 1,000 mL infusion     IntraVENous  CONTINUOUS     ?  sodium chloride (NS) flush 5-40 mL   5-40 mL  IntraVENous  Q8H     ?  sodium chloride (NS) flush 5-40 mL   5-40 mL  IntraVENous  PRN     ?  polyethylene glycol (MIRALAX) packet 17 g   17 g  Oral  DAILY PRN     ?  promethazine (PHENERGAN) tablet 12.5 mg   12.5 mg  Oral  Q6H PRN  Or           ?  ondansetron (ZOFRAN) injection 4 mg   4 mg  IntraVENous  Q6H PRN           ?  Vancomycin - pharmacy to dose     Other  Rx Dosing/Monitoring

## 2019-06-05 LAB — CBC W/O DIFF
ABSOLUTE NRBC: 0 10*3/uL (ref 0.00–0.01)
HCT: 28.6 % — ABNORMAL LOW (ref 36.6–50.3)
HGB: 9.3 g/dL — ABNORMAL LOW (ref 12.1–17.0)
MCH: 28 PG (ref 26.0–34.0)
MCHC: 32.5 g/dL (ref 30.0–36.5)
MCV: 86.1 FL (ref 80.0–99.0)
MPV: 12 FL (ref 8.9–12.9)
NRBC: 0 PER 100 WBC
PLATELET: 197 10*3/uL (ref 150–400)
RBC: 3.32 M/uL — ABNORMAL LOW (ref 4.10–5.70)
RDW: 16.3 % — ABNORMAL HIGH (ref 11.5–14.5)
WBC: 10 10*3/uL (ref 4.1–11.1)

## 2019-06-05 LAB — METABOLIC PANEL, COMPREHENSIVE
A-G Ratio: 0.5 — ABNORMAL LOW (ref 1.1–2.2)
ALT (SGPT): 27 U/L (ref 12–78)
AST (SGOT): 57 U/L — ABNORMAL HIGH (ref 15–37)
Albumin: 2.3 g/dL — ABNORMAL LOW (ref 3.5–5.0)
Alk. phosphatase: 76 U/L (ref 45–117)
Anion gap: 8 mmol/L (ref 5–15)
BUN/Creatinine ratio: 17 (ref 12–20)
BUN: 53 MG/DL — ABNORMAL HIGH (ref 6–20)
Bilirubin, total: 0.7 MG/DL (ref 0.2–1.0)
CO2: 23 mmol/L (ref 21–32)
Calcium: 8.1 MG/DL — ABNORMAL LOW (ref 8.5–10.1)
Chloride: 117 mmol/L — ABNORMAL HIGH (ref 97–108)
Creatinine: 3.15 MG/DL — ABNORMAL HIGH (ref 0.70–1.30)
GFR est AA: 23 mL/min/{1.73_m2} — ABNORMAL LOW (ref >60)
GFR est non-AA: 19 mL/min/{1.73_m2} — ABNORMAL LOW (ref >60)
Globulin: 4.6 g/dL — ABNORMAL HIGH (ref 2.0–4.0)
Glucose: 113 mg/dL — ABNORMAL HIGH (ref 65–100)
Potassium: 3.7 mmol/L (ref 3.5–5.1)
Protein, total: 6.9 g/dL (ref 6.4–8.2)
Sodium: 148 mmol/L — ABNORMAL HIGH (ref 136–145)

## 2019-06-05 LAB — GLUCOSE, POC
Glucose (POC): 142 mg/dL — ABNORMAL HIGH (ref 65–100)
Glucose (POC): 118 mg/dL — ABNORMAL HIGH (ref 65–100)
Glucose (POC): 159 mg/dL — ABNORMAL HIGH (ref 65–100)
Glucose (POC): 117 mg/dL — ABNORMAL HIGH (ref 65–100)

## 2019-06-05 LAB — CULTURE, URINE
Colonies Counted: >100000
Colony Count: >100000

## 2019-06-05 LAB — LACTIC ACID
Lactic Acid: 1.4 MMOL/L (ref 0.4–2.0)
Lactic acid: 1.4 MMOL/L (ref 0.4–2.0)

## 2019-06-05 LAB — COMPREHENSIVE METABOLIC PANEL
ALT: 27 U/L (ref 12–78)
AST: 57 U/L — ABNORMAL HIGH (ref 15–37)
Albumin/Globulin Ratio: 0.5 — ABNORMAL LOW (ref 1.1–2.2)
Albumin: 2.3 g/dL — ABNORMAL LOW (ref 3.5–5.0)
Alkaline Phosphatase: 76 U/L (ref 45–117)
Anion Gap: 8 mmol/L (ref 5–15)
BUN: 53 MG/DL — ABNORMAL HIGH (ref 6–20)
Bun/Cre Ratio: 17 (ref 12–20)
CO2: 23 mmol/L (ref 21–32)
Calcium: 8.1 MG/DL — ABNORMAL LOW (ref 8.5–10.1)
Chloride: 117 mmol/L — ABNORMAL HIGH (ref 97–108)
Creatinine: 3.15 MG/DL — ABNORMAL HIGH (ref 0.70–1.30)
EGFR IF NonAfrican American: 19 mL/min/{1.73_m2} — ABNORMAL LOW (ref >60)
GFR African American: 23 mL/min/{1.73_m2} — ABNORMAL LOW (ref >60)
Globulin: 4.6 g/dL — ABNORMAL HIGH (ref 2.0–4.0)
Glucose: 113 mg/dL — ABNORMAL HIGH (ref 65–100)
Potassium: 3.7 mmol/L (ref 3.5–5.1)
Sodium: 148 mmol/L — ABNORMAL HIGH (ref 136–145)
Total Bilirubin: 0.7 MG/DL (ref 0.2–1.0)
Total Protein: 6.9 g/dL (ref 6.4–8.2)

## 2019-06-05 LAB — POCT GLUCOSE
POC Glucose: 142 mg/dL — ABNORMAL HIGH (ref 65–100)
POC Glucose: 118 mg/dL — ABNORMAL HIGH (ref 65–100)
POC Glucose: 159 mg/dL — ABNORMAL HIGH (ref 65–100)
POC Glucose: 117 mg/dL — ABNORMAL HIGH (ref 65–100)

## 2019-06-05 LAB — CBC
Hematocrit: 28.6 % — ABNORMAL LOW (ref 36.6–50.3)
Hemoglobin: 9.3 g/dL — ABNORMAL LOW (ref 12.1–17.0)
MCH: 28 PG (ref 26.0–34.0)
MCHC: 32.5 g/dL (ref 30.0–36.5)
MCV: 86.1 FL (ref 80.0–99.0)
MPV: 12 FL (ref 8.9–12.9)
NRBC Absolute: 0 10*3/uL (ref 0.00–0.01)
Nucleated RBCs: 0 PER 100 WBC
Platelets: 197 10*3/uL (ref 150–400)
RBC: 3.32 M/uL — ABNORMAL LOW (ref 4.10–5.70)
RDW: 16.3 % — ABNORMAL HIGH (ref 11.5–14.5)
WBC: 10 10*3/uL (ref 4.1–11.1)

## 2019-06-05 MED ORDER — CHOLECALCIFEROL (VITAMIN D3) 1,000 UNIT (25 MCG) TAB
Freq: Every day | ORAL | Status: DC
Start: 2019-06-05 — End: 2019-06-18
  Administered 2019-06-05 – 2019-06-18 (×14): via ORAL

## 2019-06-05 MED ORDER — ASCORBIC ACID 500 MG TAB
500 mg | Freq: Two times a day (BID) | ORAL | Status: AC
Start: 2019-06-05 — End: 2019-06-09
  Administered 2019-06-05 – 2019-06-09 (×10): via ORAL

## 2019-06-05 MED ORDER — DEXTROSE 5% IN WATER (D5W) IV
INTRAVENOUS | Status: DC
Start: 2019-06-05 — End: 2019-06-14
  Administered 2019-06-05 – 2019-06-13 (×14): via INTRAVENOUS

## 2019-06-05 MED ORDER — GABAPENTIN 300 MG CAP
300 mg | Freq: Three times a day (TID) | ORAL | Status: DC
Start: 2019-06-05 — End: 2019-06-05
  Administered 2019-06-05: 14:00:00 via ORAL

## 2019-06-05 MED ORDER — CEFEPIME 2 GRAM SOLUTION FOR INJECTION
2 gram | INTRAMUSCULAR | Status: DC
Start: 2019-06-05 — End: 2019-06-08
  Administered 2019-06-05 – 2019-06-09 (×4): via INTRAVENOUS

## 2019-06-05 MED ORDER — DOCUSATE SODIUM 100 MG CAP
100 mg | Freq: Every day | ORAL | Status: DC
Start: 2019-06-05 — End: 2019-06-18
  Administered 2019-06-06 – 2019-06-18 (×11): via ORAL

## 2019-06-05 MED ORDER — POTASSIUM CHLORIDE SR 10 MEQ TAB
10 mEq | ORAL | Status: AC
Start: 2019-06-05 — End: 2019-06-04
  Administered 2019-06-05: via ORAL

## 2019-06-05 MED ORDER — INSULIN LISPRO 100 UNIT/ML INJECTION
100 unit/mL | Freq: Four times a day (QID) | SUBCUTANEOUS | Status: DC
Start: 2019-06-05 — End: 2019-06-12
  Administered 2019-06-05 – 2019-06-12 (×9): via SUBCUTANEOUS

## 2019-06-05 MED ORDER — POTASSIUM CHLORIDE SR 10 MEQ TAB
10 mEq | ORAL | Status: AC
Start: 2019-06-05 — End: 2019-06-05
  Administered 2019-06-05: 15:00:00 via ORAL

## 2019-06-05 MED ORDER — ZINC SULFATE 220 MG (50 MG ELEMENTAL ZINC) CAP
50 mg zinc (220 mg) | Freq: Every day | ORAL | Status: AC
Start: 2019-06-05 — End: 2019-06-13
  Administered 2019-06-06 – 2019-06-13 (×8): via ORAL

## 2019-06-05 MED ORDER — PANTOPRAZOLE 40 MG TAB, DELAYED RELEASE
40 mg | Freq: Two times a day (BID) | ORAL | Status: DC
Start: 2019-06-05 — End: 2019-06-18
  Administered 2019-06-05 – 2019-06-18 (×26): via ORAL

## 2019-06-05 MED ORDER — PANTOPRAZOLE 40 MG TAB, DELAYED RELEASE
40 mg | Freq: Every day | ORAL | Status: DC
Start: 2019-06-05 — End: 2019-06-05

## 2019-06-05 MED ORDER — GABAPENTIN 300 MG CAP
300 mg | Freq: Two times a day (BID) | ORAL | Status: DC
Start: 2019-06-05 — End: 2019-06-18
  Administered 2019-06-05 – 2019-06-18 (×27): via ORAL

## 2019-06-05 MED ORDER — SUCRALFATE 1 GRAM TAB
1 gram | Freq: Four times a day (QID) | ORAL | Status: DC
Start: 2019-06-05 — End: 2019-06-18
  Administered 2019-06-05 – 2019-06-18 (×56): via ORAL

## 2019-06-05 MED FILL — DOCU 50 MG/5 ML ORAL LIQUID: 50 mg/5 mL | ORAL | Qty: 10

## 2019-06-05 MED FILL — HYDROMORPHONE (PF) 1 MG/ML IJ SOLN: 1 mg/mL | INTRAMUSCULAR | Qty: 1

## 2019-06-05 MED FILL — CEFEPIME 1 GRAM SOLUTION FOR INJECTION: 1 gram | INTRAMUSCULAR | Qty: 1

## 2019-06-05 MED FILL — INSULIN LISPRO 100 UNIT/ML INJECTION: 100 unit/mL | SUBCUTANEOUS | Qty: 1

## 2019-06-05 MED FILL — ONDANSETRON (PF) 4 MG/2 ML INJECTION: 4 mg/2 mL | INTRAMUSCULAR | Qty: 2

## 2019-06-05 MED FILL — K-TAB 10 MEQ TABLET,EXTENDED RELEASE: 10 mEq | ORAL | Qty: 4

## 2019-06-05 MED FILL — VITAMIN C 500 MG TABLET: 500 mg | ORAL | Qty: 1

## 2019-06-05 MED FILL — DEXTROSE 5% IN WATER (D5W) IV: INTRAVENOUS | Qty: 1000

## 2019-06-05 MED FILL — GABAPENTIN 300 MG CAP: 300 mg | ORAL | Qty: 1

## 2019-06-05 MED FILL — ACETAMINOPHEN 325 MG TABLET: 325 mg | ORAL | Qty: 2

## 2019-06-05 MED FILL — CEFEPIME 2 GRAM SOLUTION FOR INJECTION: 2 gram | INTRAMUSCULAR | Qty: 2

## 2019-06-05 MED FILL — SUCRALFATE 1 GRAM TAB: 1 gram | ORAL | Qty: 1

## 2019-06-05 MED FILL — METRO I.V. 500 MG/100 ML INTRAVENOUS PIGGYBACK: 500 mg/100 mL | INTRAVENOUS | Qty: 100

## 2019-06-05 MED FILL — PANTOPRAZOLE 40 MG TAB, DELAYED RELEASE: 40 mg | ORAL | Qty: 1

## 2019-06-05 MED FILL — PROTONIX 40 MG INTRAVENOUS SOLUTION: 40 mg | INTRAVENOUS | Qty: 40

## 2019-06-05 MED FILL — CHOLECALCIFEROL (VITAMIN D3) 1,000 UNIT (25 MCG) TAB: ORAL | Qty: 2

## 2019-06-05 MED FILL — ZINC-220 50 MG ZINC (220 MG) CAPSULE: 50 mg zinc (220 mg) | ORAL | Qty: 1

## 2019-06-05 NOTE — Progress Notes (Addendum)
TOC: anticipate d/c to SNF pending clinical progression and therapy recommendations;  Covid positive; Will need insurance auth & Covid test for SNF placment; BLS transport; Will need 2nd IMM letter prior to d/c    RUR: 20%  Reason for Admission:   AKI, Covid positive, Hypothermia, GIB                    RUR Score:   20%               PCP: First and Last name:   Galen Daft, NP     Name of Practice:    Are you a current patient: Yes/No: YES   Approximate date of last visit: within last 6 months    Can you participate in a virtual visit if needed:   YES  Do you (patient/family) have any concerns for transition/discharge?                 Daughter expressed concerns that pt needs extra help with ADLS at home and thinks pt will need rehab at d/c. Daughter also expressed that pt has had difficulty affording medications.  Plan for utilizing home health:     Anticipate d/c to SNF pending clinical progression  Current Advanced Directive/Advance Care Plan:  Full Code      Healthcare Decision Maker:   Click here to complete [place] including selection of the Healthcare Decision Maker Relationship (ie "Primary")          Michael Lozano, daughter, [phone]    Transition of Care Plan:        1220-CM reviewed pt chart & contacted pt's daughter, Michael Lozano, [phone], in lieu of covid.  As per dtr, pt resides with grand-daughter, Michael Lozano, [phone], in a split level home with 7 steps to entrance. Home has a very steep driveway and has 7 steps to access 2nd floor bedroom.    Prior to admission, pt was needing more assistance with ADLs & IADLs. Pt has dme of cane only. Pt uses [facility] for meds with daughter expressed that pt has had difficulty affording medications.     Cm confirmed pcp and demographics.    Daughter reported that pt had prior [facility] for wound care a few years and a prior stay in two nursing homes for rehab, one in [place], [place] area as well as [facility].     Anticipate  BLS transport.  CM to follow.  Dorian Heckle RN BSN CCM  Care Management Interventions  PCP Verified by CM: Yes  Palliative Care Criteria Met (RRAT>21 & CHF Dx)?: No  Mode of Transport at Discharge: BLS  Transition of Care Consult (CM Consult): Discharge Planning  Discharge Durable Medical Equipment: No(pt owns a cane )  Health Maintenance Reviewed: Yes  Current Support Network: Own Home(lives with grand daughter )  Confirm Follow Up Transport: (BLS transport )  The Plan for Transition of Care is Related to the Following Treatment Goals : medical and therapy clearance   Discharge Location  Discharge Placement: Skilled nursing facility

## 2019-06-05 NOTE — Progress Notes (Signed)
Infectious Disease Progress Note       Subjective:     Mr Lupercio seen  Say his L leg hurts not R foot with ulcer  Discussed with ICU team            Objective:    Vitals:   Patient Vitals for the past 24 hrs:   Temp Pulse Resp BP SpO2   06/05/19 1300 ??? 61 9 119/60 100 %   06/05/19 1200 97.4 ??F (36.3 ??C) 62 11 126/61 100 %   06/05/19 1100 ??? 64 13 (!) 119/59 100 %   06/05/19 1000 ??? 61 (!) 7 124/64 100 %   06/05/19 0900 ??? 60 13 127/66 100 %   06/05/19 0800 97.8 ??F (36.6 ??C) 60 8 137/65 98 %   06/05/19 0700 ??? 60 (!) 31 116/81 99 %   06/05/19 0600 ??? 61 14 127/65 98 %   06/05/19 0500 ??? 61 12 125/62 99 %   06/05/19 0400 97.5 ??F (36.4 ??C) 62 12 124/62 98 %   06/05/19 0300 ??? 63 14 131/63 99 %   06/05/19 0200 ??? 72 18 (!) 96/49 100 %   06/05/19 0100 ??? 62 14 (!) 110/58 98 %   06/05/19 0000 (!) 96.7 ??F (35.9 ??C) 60 21 (!) 116/58 98 %   06/04/19 2300 ??? 69 16 121/67 98 %   06/04/19 2200 ??? 66 17 (!) 99/54 99 %   06/04/19 2100 ??? 62 9 (!) 104/49 99 %   06/04/19 2000 97.7 ??F (36.5 ??C) 61 12 (!) 104/49 99 %   06/04/19 1900 ??? 78 18 (!) 108/53 99 %   06/04/19 1800 ??? 63 16 (!) 105/58 98 %   06/04/19 1700 ??? 61 19 (!) 103/51 99 %   06/04/19 1600 ??? 60 10 (!) 109/54 98 %   06/04/19 1500 ??? 70 12 (!) 93/51 98 %       Physical Exam:  Gen: No apparent distress  HEENT:   No scleral icterus, no thrush   CV: S1,2 heard regularly on monitor  Lungs: non labored breathing   Abdomen: soft, non tender,   Skin: no rash on face, extremities   Psych:  non tearful  Neuro: awake, alert, knew his name, knew he was at [facility], no tremors   Musculoskeletal:  + R plantar wound, no surrounding erythema  Medications:    Current Facility-Administered Medications:   ???  insulin lispro (HUMALOG) injection, , SubCUTAneous, AC&HS, Theodore Demark R, NP-C, 2 Units at 06/05/19 1102  ???  gabapentin (NEURONTIN) capsule 300 mg, 300 mg, Oral, BID, Theodore Demark R, NP-C, 300 mg at 06/05/19 1057  ???  [START ON 06/06/2019] zinc sulfate (ZINCATE) 50 mg zinc (220 mg) capsule 1 Cap,  1 Cap, Oral, DAILY, Theodore Demark R, NP-C  ???  ascorbic acid (vitamin C) (VITAMIN C) tablet 500 mg, 500 mg, Oral, BID, Theodore Demark R, NP-C, 500 mg at 06/05/19 1314  ???  pantoprazole (PROTONIX) tablet 40 mg, 40 mg, Oral, BID, Theodore Demark R, NP-C  ???  cholecalciferol (VITAMIN D3) (1000 Units /25 mcg) tablet 2,000 Units, 2,000 Units, Oral, DAILY, Theodore Demark R, NP-C, 2,000 Units at 06/05/19 1314  ???  sucralfate (CARAFATE) tablet 1 g, 1 g, Oral, AC&HS, Meyer Russel, NP-C  ???  cefepime (MAXIPIME) 2 g in 0.9% sodium chloride (MBP/ADV) 100 mL MBP, 2 g, IntraVENous, Q24H, Matthew, Dellyn R, NP-C  ???  HYDROmorphone (PF) (DILAUDID) injection 0.5 mg, 0.5 mg, IntraVENous, Q4H  PRN, Arta Silence, NP, 0.5 mg at 06/05/19 0454  ???  dextrose 5% infusion, 150 mL/hr, IntraVENous, CONTINUOUS, Patel, Deep V, MD, Last Rate: 150 mL/hr at 06/05/19 1314, 150 mL/hr at 06/05/19 1314  ???  albuterol (PROVENTIL HFA, VENTOLIN HFA, PROAIR HFA) inhaler 1 Puff, 1 Puff, Inhalation, Q4H PRN, Erline Levine, NP  ???  glucose chewable tablet 16 g, 4 Tab, Oral, PRN, Erline Levine, NP  ???  dextrose (D50W) injection syrg 12.5-25 g, 25-50 mL, IntraVENous, PRN, Erline Levine, NP  ???  glucagon (GLUCAGEN) injection 1 mg, 1 mg, IntraMUSCular, PRN, Erline Levine, NP  ???  acetaminophen (TYLENOL) tablet 650 mg, 650 mg, Oral, Q6H PRN **OR** acetaminophen (TYLENOL) suppository 650 mg, 650 mg, Rectal, Q6H PRN, Lockard, Remi Deter, NP  ???  metroNIDAZOLE (FLAGYL) IVPB premix 500 mg, 500 mg, IntraVENous, Q12H, Sinnatamby, Diane S, MD, Last Rate: 100 mL/hr at 06/05/19 0835, 500 mg at 06/05/19 0835  ???  acetaminophen (TYLENOL) tablet 650 mg, 650 mg, Oral, Q6H, Sinnatamby, Diane S, MD, 650 mg at 06/05/19 1100  ???  sodium chloride (NS) flush 5-40 mL, 5-40 mL, IntraVENous, Q8H, Lockard, Sara H, NP, 10 mL at 06/05/19 1315  ???  sodium chloride (NS) flush 5-40 mL, 5-40 mL, IntraVENous, PRN, Lockard, Remi Deter, NP  ???  polyethylene glycol (MIRALAX) packet 17 g, 17 g, Oral, DAILY PRN,  Jessie Foot, Remi Deter, NP  ???  promethazine (PHENERGAN) tablet 12.5 mg, 12.5 mg, Oral, Q6H PRN **OR** ondansetron (ZOFRAN) injection 4 mg, 4 mg, IntraVENous, Q6H PRN, Arta Silence, NP, 4 mg at 06/05/19 1320      Labs:  Recent Results (from the past 24 hour(s))   GLUCOSE, POC    Collection Time: 06/04/19  6:16 PM   Result Value Ref Range    Glucose (POC) 128 (H) 65 - 100 mg/dL    Performed by Myer Haff RN    METABOLIC PANEL, COMPREHENSIVE    Collection Time: 06/04/19  6:25 PM   Result Value Ref Range    Sodium 148 (H) 136 - 145 mmol/L    Potassium 3.6 3.5 - 5.1 mmol/L    Chloride 118 (H) 97 - 108 mmol/L    CO2 24 21 - 32 mmol/L    Anion gap 6 5 - 15 mmol/L    Glucose 122 (H) 65 - 100 mg/dL    BUN 63 (H) 6 - 20 MG/DL    Creatinine 3.84 (H) 0.70 - 1.30 MG/DL    BUN/Creatinine ratio 16 12 - 20      GFR est AA 18 (L) >60 ml/min/1.67m    GFR est non-AA 15 (L) >60 ml/min/1.772m   Calcium 7.8 (L) 8.5 - 10.1 MG/DL    Bilirubin, total 0.6 0.2 - 1.0 MG/DL    ALT (SGPT) 23 12 - 78 U/L    AST (SGOT) 59 (H) 15 - 37 U/L    Alk. phosphatase 77 45 - 117 U/L    Protein, total 6.8 6.4 - 8.2 g/dL    Albumin 2.3 (L) 3.5 - 5.0 g/dL    Globulin 4.5 (H) 2.0 - 4.0 g/dL    A-G Ratio 0.5 (L) 1.1 - 2.2     CULTURE, WOUND W GRAM STAIN    Collection Time: 06/04/19  6:25 PM    Specimen: Ulcer; Wound   Result Value Ref Range    Special Requests: NO SPECIAL REQUESTS      GRAM STAIN OCCASIONAL WBCS SEEN  GRAM STAIN 1+ GRAM VARIABLE RODS      GRAM STAIN OCCASIONAL GRAM POSITIVE COCCI IN PAIRS      Culture result: PENDING    GLUCOSE, POC    Collection Time: 06/04/19 11:28 PM   Result Value Ref Range    Glucose (POC) 142 (H) 65 - 100 mg/dL    Performed by BENOIT TODD    METABOLIC PANEL, COMPREHENSIVE    Collection Time: 06/05/19  4:28 AM   Result Value Ref Range    Sodium 148 (H) 136 - 145 mmol/L    Potassium 3.7 3.5 - 5.1 mmol/L    Chloride 117 (H) 97 - 108 mmol/L    CO2 23 21 - 32 mmol/L    Anion gap 8 5 - 15 mmol/L    Glucose 113 (H) 65 - 100  mg/dL    BUN 53 (H) 6 - 20 MG/DL    Creatinine 3.15 (H) 0.70 - 1.30 MG/DL    BUN/Creatinine ratio 17 12 - 20      GFR est AA 23 (L) >60 ml/min/1.70m    GFR est non-AA 19 (L) >60 ml/min/1.775m   Calcium 8.1 (L) 8.5 - 10.1 MG/DL    Bilirubin, total 0.7 0.2 - 1.0 MG/DL    ALT (SGPT) 27 12 - 78 U/L    AST (SGOT) 57 (H) 15 - 37 U/L    Alk. phosphatase 76 45 - 117 U/L    Protein, total 6.9 6.4 - 8.2 g/dL    Albumin 2.3 (L) 3.5 - 5.0 g/dL    Globulin 4.6 (H) 2.0 - 4.0 g/dL    A-G Ratio 0.5 (L) 1.1 - 2.2     LACTIC ACID    Collection Time: 06/05/19  4:28 AM   Result Value Ref Range    Lactic acid 1.4 0.4 - 2.0 MMOL/L   CBC W/O DIFF    Collection Time: 06/05/19  4:28 AM   Result Value Ref Range    WBC 10.0 4.1 - 11.1 K/uL    RBC 3.32 (L) 4.10 - 5.70 M/uL    HGB 9.3 (L) 12.1 - 17.0 g/dL    HCT 28.6 (L) 36.6 - 50.3 %    MCV 86.1 80.0 - 99.0 FL    MCH 28.0 26.0 - 34.0 PG    MCHC 32.5 30.0 - 36.5 g/dL    RDW 16.3 (H) 11.5 - 14.5 %    PLATELET 197 150 - 400 K/uL    MPV 12.0 8.9 - 12.9 FL    NRBC 0.0 0 PER 100 WBC    ABSOLUTE NRBC 0.00 0.00 - 0.01 K/uL   GLUCOSE, POC    Collection Time: 06/05/19  5:33 AM   Result Value Ref Range    Glucose (POC) 118 (H) 65 - 100 mg/dL    Performed by BECorrie MckusickODD    GLUCOSE, POC    Collection Time: 06/05/19 11:00 AM   Result Value Ref Range    Glucose (POC) 159 (H) 65 - 100 mg/dL    Performed by CaLudwig Lean          Micro:     Blood: 06/02/19  Special Requests: NO SPECIAL REQUESTS  ?? Preliminary   Culture result: NO GROWTH 2 DAYS              Urine:   Specimen Information: Cath Urine   ??    Component Value Ref Range & Units Status   Special Requests: NO SPECIAL REQUESTS  ??  Preliminary   Colony Count >100,000   COLONIES/mL  ?? Preliminary   Culture result: GRAM NEGATIVE RODSAbnormal           05/17/19 Wound   Foot; Wound    ??    Component Value Ref Range & Units Status   Special Requests: NO SPECIAL REQUESTS  ?? Final   GRAM STAIN NO WBC'S SEEN  ?? Final   GRAM STAIN FEW GRAM POSITIVE  COCCI  ?? Final   GRAM STAIN FEW GRAM POSITIVE RODS  ?? Final   Culture result: Abnormal   ?? Final   LIGHT ENTEROBACTER CLOACAE COMPLEX    Culture result: LIGHT PROVIDENCIA RETTGERIAbnormal   ?? Final   Culture result:  ?? Final   LIGHT MIXED SKIN FLORA ISOLATED    Susceptibility     Enterobacter cloacae complex Providencia rettgeri     MIC MIC    Amikacin ($) <=2 ug/mL S <=2 ug/mL S    Ampicillin ($)   >=32 ug/mL R    Ampicillin/sulbactam ($)   >=32 ug/mL R    Cefazolin ($) >=64 ug/mL R >=64 ug/mL R    Cefepime ($$) <=1 ug/mL S <=1 ug/mL S    Cefoxitin >=64 ug/mL R <=4 ug/mL S    Ceftazidime ($) <=1 ug/mL S <=1 ug/mL S    Ceftriaxone ($) <=1 ug/mL S <=1 ug/mL S    Ciprofloxacin ($) <=0.25 ug/mL S <=0.25 ug/mL S    Gentamicin ($) <=1 ug/mL S <=1 ug/mL S    Levofloxacin ($) <=0.12 ug/mL S <=0.12 ug/mL S    Meropenem ($$) <=0.25 ug/mL S <=0.25 ug/mL S    Piperacillin/Tazobac ($) <=4 ug/mL S <=4 ug/mL S    Tobramycin ($) 8 ug/mL I <=1 ug/mL S    Trimeth/Sulfa >=320 ug/mL R              Result History          Pathology:  08/12/2011  FINAL PATHOLOGIC DIAGNOSIS  1. Bone, left fifth metatarsal head, biopsy:  Acute osteomyelitis  2. Bone, left fifth metatarsal head, biopsy:  Acute osteomyelitis      Imaging:  CT RLE  ??  FINDINGS: The evaluation for infection is partially limited secondary to the  lack of IV contrast. There is an ulcer plantar to the second MTP joint measuring  17 x 20 mm. There is no evidence of a drainable fluid collection. There is no  subcutaneous emphysema.  ??  The bones are mildly osteopenic. There is dorsal subluxation at the first MTP  joint. There are extensive erosive changes involving the first MTP joint that  have a chronic appearance. The patient is status post fifth MTP arthrotomy. No  acute fracture or dislocation. No definite evidence of acute osteomyelitis.  ??  IMPRESSION  ??  1. Ulcer plantar to the second MTP joint. No evidence of a focal drainable fluid  collection.  2. Dorsal subluxation at  the first MTP joint with erosive changes that appear  chronic. These findings may be related to inflammatory arthropathy, gout, or  prior septic arthritis. No definite evidence of acute osteomyelitis.    CT A/P  FINDINGS:   LOWER THORAX: Atelectasis at the lung bases  LIVER: Probable cyst left hepatic lobe  BILIARY TREE: Stones in a nondistended gallbladder. CBD is not dilated.  SPLEEN: within normal limits.  PANCREAS: No focal abnormality.  ADRENALS: Unremarkable.  KIDNEYS/URETERS: Probable cyst left kidney but no hydronephrosis  STOMACH: Unremarkable.  SMALL BOWEL:  No dilatation or wall thickening.  COLON: Diverticulosis of the left colon. No evidence of acute diverticulitis  APPENDIX: Surgically absent  PERITONEUM: No ascites or pneumoperitoneum.  RETROPERITONEUM: No lymphadenopathy or aortic aneurysm. There are vascular  calcifications  REPRODUCTIVE ORGANS: Not enlarged  URINARY BLADDER: Decompressed by Foley  BONES: No destructive bone lesion.  ABDOMINAL WALL: Umbilical hernia containing fat  ADDITIONAL COMMENTS: N/A  ??  IMPRESSION  ??  1. No hydronephrosis  2. Diverticulosis. No evidence of acute diverticulitis  3. Gallstones. No evidence of acute cholecystitis    CT head  ??  FINDINGS:  Intervals and cisterns are mildly enlarged compatible with the overall degree of  volume loss. Encephalomalacia right posterior frontal lobe and parietal lobe.  Mild low density within the periventricular white matter. There is no  intracranial hemorrhage, extra-axial collection, or mass effect. The basilar  cisterns are open. No CT evidence of acute infarct.  ??  The bone windows demonstrate no abnormalities. Minimal sinus mucosal  inflammatory change.  ??  IMPRESSION    Encephalomalacia posterior left frontal and parietal lobe. No acute intracranial  Abnormality    06/02/19  Indication: Coffee-ground emesis, leukocytosis  ??  Comparison to earlier the same day. Portable exam obtained at [facility] demonstrates  minimal right lower lobe  infiltrate, slightly increased compared to the prior  exam.  ??  IMPRESSION  Minimal right lower lobe infiltrate is slightly increased in the  interval.  ??    06/02/19  FINDINGS: Three views of the right foot demonstrate that is post surgical  correction of the distal fifth digit. Hyperextension at the first MTP joint with  erosive change.. The soft tissues are within normal limits.  ??  IMPRESSION  ??  No definite plain film evidence for ostomy myelitis..    Assessment / Plan    Mr Purk is a 85 year old gentleman with a history of diabetes, left diabetic foot infection, osteomyelitis status post left fifth metatarsal head resection in 2013, chronic right plantar wound diagnosed, indwelling cardiac device/pacemaker with SARS-CoV-2 on 05/18/2019. He was seen in the emergency room on 4/2 and 05/28/2019.  He presented with fever generalized body aches on 05/18/2019 and had a positive rapid SARS-CoV-2.  Appears he was discharged home and went back to the ER on 05/28/2019 with a chief complaint of fatigue and not eating or drinking.  He was brought to the emergency room by his grandson per the ER note with decline and very poor p.o. intake.  There was also concerns for periods of disorientation and confusion.  Initially found to be hyponatremic with a sodium of 163 and a creatinine of 6.6 and a BUN of 130.  Labs also showed elevated CK, leukocytosis in the range of 12-16, anemia, elevated inflammatory markers.  His urine on 06/02/2019 had 0-4 white cells and negative for bacteria.  Blood culture sent during this admission is negative.  Urine culture shows greater than 100,000 gram-negative rods.  Wound culture report in his foot wound from 05/17/19 resulted with Enterobacter cloacae and Providencia rettgeri, light mixed skin flora.  He has had imaging with an x-ray as well as a CT scan of the right lower extremity and reported as no evidence of focal drainable fluid collection and no evidence of acute osteomyelitis.  He was  started on antibiotics which included vancomycin cefepime and Flagyl.      1) chronic right lower extremity plantar wound, DM foot wound, Wound cx  05/17/19 + for Enterobacter and Providentia  2)severe  hypernatremia  3) AKI  4) COVID + 05/17/19 , 06/02/19, on room air   5)Concern for GI bleed and risk for aspiration   6) DM  7) Urine cx wt Pseudomonas, Ecoli        In regards to the right plantar wound, appears chronic without any cellulitis  Wound cultures from 05/17/2019 + for Enterobacter , Providentia for which he is on Cefepime.  Cefepime can cause akinetic seizures and altered mental status in the elderly and continue to monitor.  Seems clinically patient is improved and will continue for now  Continue flagyl given risk for aspiration and coverage    will hold vancomycin and monitor as there are no signs of cellulitis around the right plantar wound  Needs good wound care  Seen by podiatry  Supportive care for SARS-CoV-2              Thank you for the opportunity to participate in the care of this patient.   Please contact with questions or concerns.      Narcisa Ganesh R Tiffny Gemmer, DO   2:04 PM

## 2019-06-05 NOTE — Progress Notes (Signed)
Day #4 of Cefepime  Indication:  aspiration PNA, UTI  Current regimen:  2G IV q24h  Abx regimen: Cefepime + Flagyl  Recent Labs     06/05/19  0428 06/04/19  1825 06/04/19  1247 06/04/19  0531 06/03/19  2316   WBC 10.0  --   --  13.2* 16.0*   CREA 3.15* 3.84* 4.35* 4.95* 5.53*   BUN 53* 63* 71* 76* 82*     Est CrCl: ~20 ml/min; UO: 1.4 ml/kg/hr  Temp (24hrs), Avg:97.4 ??F (36.3 ??C), Min:96.7 ??F (35.9 ??C), Max:97.8 ??F (36.6 ??C)    Cultures:   4/17 Urine: Pseudomonas luteola   4/17 Blood: NGTD    Plan: Change to cefepime 2 g q24h for CrCl 11-29 ml/min

## 2019-06-05 NOTE — Progress Notes (Signed)
[facility]    ICU TEAM Progress Note    Name: Michael Lozano   DOB: 1934-09-13   MRN: [id number]   Date: 06/05/2019      Assessment:   Reason for ICU Admission: GIB, Hypernatremia  ??  HPI:  Michael Lozano??is a 85 y.o.????M with PMH of recent COVID, PM, ??DM2,  And overall is a very poor historian. Information for HPI is obtained via chart and provider.  Per ER note "He has had a gradual decline, very poor p.o. intake to the point that he has not been eating or drinking for several days. ??He also is somnolent and less interactive. ??He has not fallen or had recurrent fevers. ??No vomiting, diarrhea, dyspnea." He apparently lives with is grandson who is 'never home'. While in ER pt was found to be hypernatremic and hypotensive, ICU was consulted after coffee ground emesis.     Subjective:   Overnight events:  06/05/19  No acute events overnight  No further coffee ground emesis noted. Denies N/V  NA correcting per Nephrology. Continue Dextrose gtt, increased to 177ml/hr per nephro  No DM2 Hbg A1c 6.2, may have had in the past  Neuropathy limb pain, complains of more pain in lower left foot, (foot without wound, esp in last 3 toes) - restart gabapentin, dose lowered due to renal function  ID following - continue cefepime and flagyl  Wound care for right foot wound.  Transfer to telemetry today    POD:  * No surgery found *    S/P:       Plan:     ICU Problems:  1. AKI on CKD 3 - improving  - Nephrology following, discussed with Dr. Enrique Sack   - Cr 3.15 this am, baseline 1.7?  - HD not a need at this time per renal  - VF-D5W increased to 150 ml/hr  - slow NA correction  - Sodium bicarb d/c  - Urine output monitor closely  - Strict I and O   ??  2. Dehydration  - IVF per nephrology  - Poor PO intake - Carafate ACHS for appetite stimulation    3. Hypernatremia - improved  - Serial BMP Q6H   ??  5. COVID +  - Recent covid 4/2 +  - On RA, cont to monitor, add supplemental oxygen to keep sats >92%  - Zinc, Vit C 10 days.  ??  6.  Coffee ground emesis   -CBC daily  -GI following  -PPI BID   -Transfuse hgb <7  -NGT d/c  ??  7. Leukocytosis   - Blood cx NGTD  - Wound: 06/04/19 - OCCASIONAL GRAM POSITIVE COCCI; 05/17/19 LIGHT ENTEROBACTER CLOACAE COMPLEX, LIGHT PROVIDENCIA RETTGERI   -UA+, Urine culture 06/02/19 - PSEUDOMONAS LUTEOLA , Culture result: ESCHERICHIA COLI       -Abx per ID, on Cefepime and Flagyl     8. Non-healing wound on the RLE/ ?OM  - Wound care consulted and following  - ID following, appreciate input   - Podiatry consulted, appreciate input  - Cont broad spectrum coverage     COVID Treatment:  a. Vit C and Zinc and Vit D  b. Was first diagnosed 4/2 with COVID PNA, not a candidate for remdesivir or toci   c. If respiratory status worsens and he is requiring O2 then consider starting  decadron 6 mg Q24 X10 days.    F - Feeding:  Reg diet  A - Analgesia: Dilaudid and Acetaminophen  S -  Sedation: None  T - DVT Prophylaxis: SCD's or Sequential Compression Device   C - Code Status: Full Code  H - Head of Bed: > 30 Degrees  U - Ulcer Prophylaxis: Protonix (pantoprazole)  PO  G - Glycemic Control: Insulin SSI  S - Spontaneous Breathing Trial: No  B - Bowel Regimen: Docusate (Colace)  I - Indwelling Catheter:   Tubes: None  Lines: Peripheral IV  Drains: None  D - De-escalation of Antibiotics: per ID, Cefepime, Flagyl    Objective:     Visit Vitals  BP (!) 119/59   Pulse 64   Temp 97.8 ??F (36.6 ??C)   Resp 13   Ht 5\' 10"  (1.778 m)   Wt 76 kg (167 lb 8.8 oz)   SpO2 100%   BMI 24.04 kg/m??      O2 Device: None (Room air) Temp (24hrs), Avg:97.4 ??F (36.3 ??C), Min:96.7 ??F (35.9 ??C), Max:97.8 ??F (36.6 ??C)           Hemodynamics:   PAP:   CO:     Wedge:   CI:     CVP:    SVR:       PVR:       Ventilator Settings:  Mode Rate Tidal Volume Pressure FiO2 PEEP                    Peak airway pressure:      Minute ventilation:          Intake/Output:     Intake/Output Summary (Last 24 hours) at 06/05/2019 1243  Last data filed at 06/05/2019 1100  Gross per  24 hour   Intake 3030.83 ml   Output 2175 ml   Net 855.83 ml       Physical Exam:  General:  Awake, alert, oriented, looks emaciated    Eyes:  Sclera anicteric. Pupils equally round and reactive to light.   Mouth/Throat: Mucous membranes dry, oral pharynx clear   Neck: Supple   Lungs:   Clear to auscultation bilaterally, good effort   CV:  Regular rate and rhythm,no murmur, click, rub or gallop. Pacer left chest wall    Abdomen:   Soft, non-tender. bowel sounds normal. non-distended   Extremities: No cyanosis or edema   Skin: Skin color, texture, turgor normal. Wound with dressing intact to right foot.    Lymph nodes: Cervical and supraclavicular normal   Musculoskeletal: No swelling or deformity   Lines/Devices:  Intact, no erythema, drainage or tenderness   Neuro- Alert to self and place, generalized weakness, follows commands appropriately, MAE.         Labs & Data: Reviewed    Medications: Reviewed    Chest X-Ray:  06/02/19  Study Result    Indication: Coffee-ground emesis, leukocytosis  ??  Comparison to earlier the same day. Portable exam obtained at 1942 demonstrates  minimal right lower lobe infiltrate, slightly increased compared to the prior  exam.  ??  IMPRESSION  Minimal right lower lobe infiltrate is slightly increased in the  interval.  ??     X-ray Rt foot Min 3 view 06/02/19  FINDINGS: Three views of the right foot demonstrate that is post surgical  correction of the distal fifth digit. Hyperextension at the first MTP joint with  erosive change.. The soft tissues are within normal limits.  ??  IMPRESSION  ??  No definite plain film evidence for ostomy myelitis..    TTE:   08/09/2011  SUMMARY:  Procedure information: Systolic blood pressure was 109 mmHg, at the start   of the study. Diastolic blood pressure was 62 mmHg, at the start of the   study.     Left ventricle: Systolic function was normal. Ejection fraction was   estimated to be 40 %. There was moderate hypokinesis of the mid   anteroseptal,  basal-mid inferior, and apical wall(s).     Tricuspid valve: There was mild regurgitation.     Multidisciplinary Rounds Completed:  Yes    ABCDEF Bundle/Checklist Completed: Yes    SPECIAL EQUIPMENT: None    DISPOSITION: Transfer to non-ICU bed    CRITICAL CARE CONSULTANT NOTE  I had a face to face encounter with the patient, reviewed and interpreted patient data including clinical events, labs, images, vital signs, I/O's, and examined patient.  I have discussed the case and the plan and management of the patient's care with the consulting services, the bedside nurses and the respiratory therapist.      NOTE OF PERSONAL INVOLVEMENT IN CARE   This patient has a high probability of imminent, clinically significant deterioration, which requires the highest level of preparedness to intervene urgently. I participated in the decision-making and personally managed or directed the management of the following life and organ supporting interventions that required my frequent assessment to treat or prevent imminent deterioration.    I personally spent 40 minutes of critical care time.  This is time spent at this critically ill patient's bedside actively involved in patient care as well as the coordination of care and discussions with the patient's family.  This does not include any procedural time which has been billed separately.      Meyer Russel AGACNP-BC     [facility]

## 2019-06-05 NOTE — Progress Notes (Signed)
Bedside shift change report given to Todd, RN (oncoming nurse) by Mallory, RN (offgoing nurse). Report included the following information SBAR, Kardex, Intake/Output, MAR, Recent Results and Cardiac Rhythm Paced.

## 2019-06-05 NOTE — Progress Notes (Signed)
Michael Lozano  Hospitalist Group     ICU Transfer/Accept Summary     This patient is being transferred OUT OF THE ICU  DATE OF TRANSFER: 06/05/2019       PATIENT ID: Michael Lozano  MRN: [id number]   DATE OF BIRTH: 07-Aug-1934    PRIMARY CARE PROVIDER: Park Pope, NP   DATE OF ADMISSION: 06/02/2019 12:08 PM    ATTENDING PHYSICIAN: Raynelle Highland, NP  CONSULTATIONS:   IP CONSULT TO NEPHROLOGY  IP CONSULT TO GASTROENTEROLOGY  IP CONSULT TO INTENSIVIST  IP CONSULT TO INFECTIOUS DISEASES  IP CONSULT TO PODIATRY  IP CONSULT TO HOSPITALIST    PROCEDURES/SURGERIES:   * No surgery found *    REASON FOR ADMISSION: <principal problem not specified>     HOSPITAL PROBLEM LIST:  Patient Active Problem List   Diagnosis Code   ??? Cardiomyopathy (HCC) I42.9   ??? CAD (coronary artery disease) I25.10   ??? Hypernatremia E87.0   ??? AKI (acute kidney injury) (HCC) N17.9   ??? GI bleed K92.2         Brief HPI and Hospital Course:    "HPI:  Michael Lozano??is a 85 y.o.????M with PMH of recent COVID, PM, ??DM2, ??And overall??is a very??poor historian.??Information for HPI is obtained via chart and provider.????Per ER note "He has had a gradual decline, very poor p.o. intake to the point that he has not been eating or drinking for several days. ??He also is somnolent and less interactive. ??He has not fallen or had recurrent fevers. ??No vomiting, diarrhea, dyspnea."??He apparently lives with is grandson who is 'never home'. While in ER pt was found to be hypernatremic and hypotensive, ICU was consulted after coffee ground emesis."     04/20: Seen and examined patient sitting up in bed.  States that he is feeling fine wants a hamburger.  Noted to be oriented x3 with intermittent confusion. ? Baseline.     Assessment and Plan:    1. AKI??on CKD 3 - improving  - Nephrology following, discussed with Dr. Enrique Sack   - Cr 3.15 this am, baseline 1.7?  - HD not a need at this time per renal  - VF-D5W increased to 150 ml/hr  - slow NA correction  - Sodium  bicarb d/c  - Urine output monitor closely  - Strict I and O??  ??  2. Dehydration  - IVF per nephrology  - Poor PO intake - Carafate ACHS for appetite stimulation  ??  3. Hypernatremia??- improved  - Serial BMP Q6H   - Na 148 this morning.   ??  5. COVID +  - Recent covid 4/2 +  - On RA, cont to monitor, add supplemental oxygen to keep sats >92%  - Zinc, Vit C 10 days.   - Per CDC Covid guidelines, likely can come off of isolation precautions as positive test 18 days ago.   ??  6. Coffee ground emesis??  -CBC daily  -GI signed off- will be available if needed  -PPI BID   -Transfuse hgb <7  -NGT d/c  ??  7. Leukocytosis??  - Blood cx NGTD  - Wound: 06/04/19 - OCCASIONAL GRAM POSITIVE COCCI; 05/17/19 LIGHT ENTEROBACTER CLOACAE COMPLEX, LIGHT PROVIDENCIA RETTGERI   -UA+, Urine culture 06/02/19 - PSEUDOMONAS LUTEOLA , Culture result: ESCHERICHIA COLI                                                   -  Abx per ID, on Cefepime and Flagyl   ??  8. Non-healing wound on the RLE/ ?OM  - Wound care consulted and following  - ID following, appreciate input   - Podiatry consulted- Recommends wound care and antibiotics.   - Cont broad spectrum coverage   ??  COVID Treatment:  a. Vit C and Zinc and Vit D  b. Was first diagnosed 4/2 with COVID PNA, not a candidate for remdesivir or toci   c. If respiratory status worsens and he is requiring O2 then consider starting  decadron 6 mg Q24 X10 days         PHYSICAL EXAMINATION:  Visit Vitals  BP 119/60   Pulse 61   Temp 97.4 ??F (36.3 ??C)   Resp 9   Ht 5\' 10"  (1.778 m)   Wt 76 kg (167 lb 8.8 oz)   SpO2 100%   BMI 24.04 kg/m??       General:          Alert, cooperative, no distress, answers questions  HEENT:           Atraumatic, MMM            Neck:               Supple, symmetrical,  thyroid: non tender  Lungs:             Clear to auscultation bilaterally on room air.  No Wheezing or Rhonchi. No rales.  Heart:              Regular  rhythm,  No  murmur   No edema  Abdomen:       Soft, non-tender. Not  distended.  Bowel sounds normal  Extremities:     No cyanosis.  No clubbing,  +2 distal pulses  Skin:                Not pale.  Not Jaundiced  No rashes. Wound to right foot- dressing is clean, dry, and intact.   Psych:             Not anxious or agitated.  Neurologic:      Alert, moves all extremities, oriented X 2-3, with intermittent confusion.     CODE STATUS:  X Full Code    DNR    Partial    Comfort Care     Signed:   Hollice Espy, NP  Date of Service:  06/05/2019  1:37 PM

## 2019-06-05 NOTE — Other (Signed)
[facility]    CLINICAL NURSE SPECIALIST CONSULT   FOLLOW UP NOTE    Initial Presentation   Michael Lozano Michael Lozano is a 85 y.o. male with 3 week history of known COVID-19 viral infection who presented to the ED 06/02/19 with his grandson who reported a steady decline in mentation and ADLs.  Blood work in ED significant for AKI, metabolic acidosis, leukocytosis with hypernatremia and admitted for medical management.      HX:   Past Medical History:   Diagnosis Date   ??? Chronic pain     diabetic neuropathy   ??? Diabetes mellitus type II, non insulin dependent ([place])    ??? Other ill-defined conditions(799.89)     Gout   ??? Thromboembolus ([place])     PVD  Osteomyelitis    DX: AKI, Rhabdomyolysis, Metabolic Acidosis, UTI    TX: IV Fluids     Hospital course   Clinical progress has been uncomplicated.     Diabetes    Patient has known Type 2 diabetes, treated PTA.     Family history unknown for diabetes.     Admission BG 138 and A1c 6.2% indicate acceptable diabetes control.     Ambulatory blood glucose management provided by primary care provider: Elmer Picker, NP.    Consulted by Michael Lutes, NP for advanced diabetes nursing assessment and care, specifically related to   [x]  Home management assessment      Diabetes-related medical history  Acute complications: None  Neurological complications  Peripheral neuropathy  Microvascular disease  Nephropathy  Macrovascular disease  Peripheral vascular disease and Foot wounds    Diabetes medication history  Drug class Currently in use Discontinued Never used   Biguanide      DDP-4 inhibitor       Sulfonylurea      Thiazolidinedione      GLP-1 RA      SGLT-2 inhibitors      Basal insulin      Bolus insulin      Fixed Dose  Combinations        Subjective   Nephrology following: HD on hold, D5 IVF at 132m/hr  Hypernatremia: Na 148 this am  GI consulted for coffee ground emesis: PPI, trending CBC. They signed off  Leukocytosis, UA+ with GNR, Wound cx with light  E.cloacae complex, Providencia rettgeri  Podiatry: Osteomyelitis R foot, CT pending  COVID: Decadron 66mx10 days    A1C 6.2%  Glucose 118-142 in the past 24 hrs  4 units correctional insulin   IV abox: flagyl, cefepime  WBC 10  GFR 19    COVID + on PCR and Rapid  Objective   Physical exam  General Awake, eating in bed, on Room Air.  Able to visualize through ICU doors. Unable to reach via bedside phone.    Visit Vitals  BP 137/65   Pulse 60   Temp 97.5 ??F (36.4 ??C)   Resp 8   Ht 5' 10"  (1.778 m)   Wt 76 kg (167 lb 8.8 oz)   SpO2 98%   BMI 24.04 kg/m??     Deferred due to COVID-19     Laboratory      CBC W/O DIFF    Collection Time: 06/05/19  4:28 AM   Result Value Ref Range    WBC 10.0 4.1 - 11.1 K/uL    RBC 3.32 (L) 4.10 - 5.70 M/uL    HGB 9.3 (L) 12.1 - 17.0 g/dL  HCT 28.6 (L) 36.6 - 50.3 %    MCV 86.1 80.0 - 99.0 FL    MCH 28.0 26.0 - 34.0 PG    MCHC 32.5 30.0 - 36.5 g/dL    RDW 16.3 (H) 11.5 - 14.5 %    PLATELET 197 150 - 400 K/uL    MPV 12.0 8.9 - 12.9 FL    NRBC 0.0 0 PER 100 WBC    ABSOLUTE NRBC 0.00 0.00 - 0.01 K/uL         METABOLIC PANEL, COMPREHENSIVE    Collection Time: 06/05/19  4:28 AM   Result Value Ref Range    Sodium 148 (H) 136 - 145 mmol/L    Potassium 3.7 3.5 - 5.1 mmol/L    Chloride 117 (H) 97 - 108 mmol/L    CO2 23 21 - 32 mmol/L    Anion gap 8 5 - 15 mmol/L    Glucose 113 (H) 65 - 100 mg/dL    BUN 53 (H) 6 - 20 MG/DL    Creatinine 3.15 (H) 0.70 - 1.30 MG/DL    BUN/Creatinine ratio 17 12 - 20      GFR est AA 23 (L) >60 ml/min/1.80m    GFR est non-AA 19 (L) >60 ml/min/1.765m   Calcium 8.1 (L) 8.5 - 10.1 MG/DL    Bilirubin, total 0.7 0.2 - 1.0 MG/DL    ALT (SGPT) 27 12 - 78 U/L    AST (SGOT) 57 (H) 15 - 37 U/L    Alk. phosphatase 76 45 - 117 U/L    Protein, total 6.9 6.4 - 8.2 g/dL    Albumin 2.3 (L) 3.5 - 5.0 g/dL    Globulin 4.6 (H) 2.0 - 4.0 g/dL    A-G Ratio 0.5 (L) 1.1 - 2.2         Factors impacting BG management  Factor Dose Comments   Nutrition:  Carb-controlled meals     60  grams/meal      Drugs:  IV antibiotics    Decadron IV antibiotics      Decadron 11m72maily (Day 2/10)          Pain Right foot pain    Infection: COVID-19     UTI GNR    AKI GFR 11, Baseline 38?    Other: Foot wound Podiatry following  Wound cx with light E.cloacae complex, Providencia rettgeri      Blood glucose pattern        Assessment and Plan   Nursing Diagnosis Risk for unstable blood glucose pattern   Nursing Intervention Domain 5250 Decision-making Support   Nursing Interventions Examined current inpatient diabetes control   Explored factors facilitating and impeding inpatient management  Identified self-management practices impeding diabetes control  Explored corrective strategies with responsible inpatient provider      Evaluation   Michael Lozano Michael Lozano an 84 66ar old gentleman with a history of Type 2 Diabetes admitted with known [id number] complicated by dehydration, AKI, UTI and right foot wound.  Little is known about diabetes related history but A1C on admission 6.2% with glucose of 136 indicates adequate glucose control PTA.  I am unable to find history of antihyperglycemic agents in previous documentation but patient has verbalized that he does not take medications for diabetes.      Glucose was 200 yesterday due to: D5 IVF for hypernatremia management, decadron 11mg60mce daily, AKI with GFR 11, UTI and foot infection.  Decadron was discontinued and glucose now 113 with  D5IVF at 154m/hr without the use of antihyperglycemic agents.  Given glucose is in goal with dextrose in IVF, I expect that it will continue to be in goal if steroids are not resumed.    Recommendations   1. Adjust to non-dextrose containing IVF once appropriate    2. Continue POC glucose ACHS with correctional insulin at HIGH sensitivity (ESRD)    3.  If off dextrose IVF and glucose remains over goal, adjust diet to consistent carbohydrate diet and Use of Subcutaneous Insulin Order set (1935)  Insulin Dosing Specific  recommendation   Basal                                      (Based on weight, BMI & GFR) [x]  0.15 units/kg/D (low dose)          Billing Code(s)   [x]  99231    Before making these care recommendations, I personally reviewed the hosptialization record, including laboratory and diagnostic data, medications and examined the patient at bedside (circumstances permitting).  Total minutes:  182     LMittie Bodo CNS  Diabetes Clinical Nurse Specialist  [facility]  Access via [employer]

## 2019-06-05 NOTE — ACP (Advance Care Planning) (Signed)
Advance Care Planning     Advance Care Planning Activator (Inpatient)  Conversation Note      Date of ACP Conversation: 06/05/19     Conversation Conducted with:  Daughter, Linda Gouveia, [phone]  ACP Activator: Jane Reilly    Health Care Decision Maker:    Current Designated Health Care Decision Maker: Daughter, Linda Gouveia, [phone]      Care Preferences    Ventilation:  "If you were in your present state of health and suddenly became very ill and were unable to breathe on your own, what would your preference be about the use of a ventilator (breathing machine) if it were available to you?"      If patient would desire the use of a ventilator (breathing machine), answer YES    "If your health worsens and it becomes clear that your chance of recovery is unlikely, what would your preference be about the use of a ventilator (breathing machine) if it were available to you?"     Would the patient desire the use of a ventilator (breathing machine)?  YES      Resuscitation  "CPR works best to restart the heart when there is a sudden event, like a heart attack, in someone who is otherwise healthy. Unfortunately, CPR does not typically restart the heart for people who have serious health conditions or who are very sick."    "In the event your heart stopped as a result of an underlying serious health condition, would you want attempts to be made to restart your heart (answer "yes" for attempt to resuscitate) or would you prefer a natural death (answer "no" for do not attempt to resuscitate)?" YES      [x] Yes  [] No   Educated Patient / Decision Maker regarding differences between Advance Directives and portable DNR orders.    Length of ACP Conversation in minutes:  10 minutes   Conversation Outcomes:  [x] ACP discussion completed  [] Existing advance directive reviewed with patient; no changes to patient's previously recorded wishes     [] New Advance Directive completed   [] Portable Do Not Resuscitate prepared  for Provider review and signature  [] POLST/POST/MOLST/MOST prepared for Provider review and signature      Follow-up plan:    [] Schedule follow-up conversation to continue planning  [] Referred individual to Provider for additional questions/concerns   [x] Advised patient/agent/surrogate to review completed ACP document and update if needed with changes in condition, patient preferences or care setting     [] This note routed to one or more involved healthcare providers

## 2019-06-05 NOTE — Other (Signed)
Primary Nurse Marlise Eves, RN and Irving Burton, RN performed a dual skin assessment on this patient Impairment noted- see wound doc flow sheet  Braden score is 14

## 2019-06-05 NOTE — Progress Notes (Signed)
NAME: Michael Lozano        DOB:  01-03-1935        MRN:  [id number]         ??              Assessment   :                                               Plan:  AKI on CKD stage 3 (creatinine~ 1.7 at baseline?)  Rhabdomyolysis  Severe Hypernatremia  Covid  Hypotension  Metabolic acidosis  Hypercalcemia  UTI Creatinine showing improvement (peak 7.7mg /dl); Down to 3.1mg /dl. Non-oliguric;  prerenal +/- ATN    Na improved to 148 but now stuck.   Increase D5W to 150cc/hr    Oral KCl x1 dose  ??  CK 1862 to 1372    ??COVID treatment->ID on board    Holding Ramipril and statin for now    Stable for transfer out of ICU    Discussed with ICU team       Subjective:     Chief Complaint:  Seen through ICU window (to limit spread of covid and conserve ppe). I spoke with ICU team. UOP 2.6L yesterday. Remains on RA.    Review of Systems:    Symptom Y/N Comments  Symptom Y/N Comments   Fever/Chills    Chest Pain     Poor Appetite    Edema     Cough    Abdominal Pain     Sputum    Joint Pain     SOB/DOE    Pruritis/Rash     Nausea/vomit    Tolerating PT/OT     Diarrhea    Tolerating Diet     Constipation    Other       Could not obtain due to:      Objective:     VITALS:   Last 24hrs VS reviewed since prior progress note. Most recent are:  Visit Vitals  BP (!) 119/59   Pulse 64   Temp 97.8 ??F (36.6 ??C)   Resp 13   Ht 5\' 10"  (1.778 m)   Wt 76 kg (167 lb 8.8 oz)   SpO2 100%   BMI 24.04 kg/m??       Intake/Output Summary (Last 24 hours) at 06/05/2019 1220  Last data filed at 06/05/2019 1100  Gross per 24 hour   Intake 3030.83 ml   Output 2175 ml   Net 855.83 ml      Telemetry Reviewed:     PHYSICAL EXAM:  General: NAD      Lab Data Reviewed: (see below)    Medications Reviewed: (see below)    PMH/SH reviewed - no change compared to H&P  ________________________________________________________________________  Care Plan discussed with:  Patient     Family       RN Y    Care Manager                    Consultant:          Comments   >50% of visit spent in counseling and coordination of care       ________________________________________________________________________  Shauntee Karp 06/07/2019, MD     Procedures: see electronic medical records for all procedures/Xrays and details which  were not copied into this note but were reviewed  prior to creation of Plan.      LABS:  Recent Labs     06/05/19  0428 06/04/19  0531   WBC 10.0 13.2*   HGB 9.3* 8.9*   HCT 28.6* 27.4*   PLT 197 214     Recent Labs     06/05/19  0428 06/04/19  1825 06/04/19  1247 06/03/19  0303 06/03/19  0303 06/02/19  2018   NA 148* 148* 147*   < > 161* 163*   K 3.7 3.6 3.8   < > 4.5 4.9   CL 117* 118* 118*   < > 134* 137*   CO2 23 24 23    < > 16* 17*   BUN 53* 63* 71*   < > 104* 110*   CREA 3.15* 3.84* 4.35*   < > 6.81* 6.60*   GLU 113* 122* 150*   < > 154* 130*   CA 8.1* 7.8* 8.1*   < > 8.3* 8.8   MG  --   --   --   --  3.4* 3.4*   PHOS  --   --   --   --  3.7 4.6    < > = values in this interval not displayed.     Recent Labs     06/05/19  0428 06/04/19  1825 06/04/19  1247   AP 76 77 85   TP 6.9 6.8 7.0   ALB 2.3* 2.3* 2.3*   GLOB 4.6* 4.5* 4.7*     Recent Labs     06/03/19  0303   APTT 29.6      No results for input(s): FE, TIBC, PSAT, FERR in the last 72 hours.   No results found for: FOL, RBCF   Recent Labs     06/03/19  0431 06/02/19  2015   PH 7.37 7.32*   PCO2 27* 29*   PO2 93 91     Recent Labs     06/03/19  0303 06/02/19  1415   CPK 1,372* 1,862*     No components found for: [facility]  Lab Results   Component Value Date/Time    Color DARK YELLOW 06/02/2019 01:00 PM    Appearance CLOUDY (A) 06/02/2019 01:00 PM    Specific gravity 1.023 06/02/2019 01:00 PM    pH (UA) 5.0 06/02/2019 01:00 PM    Protein Negative 06/02/2019 01:00 PM    Glucose Negative 06/02/2019 01:00 PM    Ketone TRACE (A) 06/02/2019 01:00 PM    Bilirubin NEGATIVE  08/09/2011 11:25 PM    Urobilinogen 0.2 06/02/2019 01:00 PM    Nitrites Positive  (A) 06/02/2019 01:00 PM    Leukocyte Esterase SMALL (A) 06/02/2019 01:00 PM    Epithelial cells FEW 06/02/2019 01:00 PM    Bacteria Negative 06/02/2019 01:00 PM    WBC 0-4 06/02/2019 01:00 PM    RBC 0-5 06/02/2019 01:00 PM       MEDICATIONS:  Current Facility-Administered Medications   Medication Dose Route Frequency   ??? insulin lispro (HUMALOG) injection   SubCUTAneous AC&HS   ??? gabapentin (NEURONTIN) capsule 300 mg  300 mg Oral BID   ??? [START ON 06/06/2019] zinc sulfate (ZINCATE) 50 mg zinc (220 mg) capsule 1 Cap  1 Cap Oral DAILY   ??? ascorbic acid (vitamin C) (VITAMIN C) tablet 500 mg  500 mg Oral BID   ??? pantoprazole (PROTONIX) tablet 40 mg  40 mg Oral BID   ??? cholecalciferol (VITAMIN D3) (1000 Units /25  mcg) tablet 2,000 Units  2,000 Units Oral DAILY   ??? HYDROmorphone (PF) (DILAUDID) injection 0.5 mg  0.5 mg IntraVENous Q4H PRN   ??? dextrose 5% infusion  150 mL/hr IntraVENous CONTINUOUS   ??? albuterol (PROVENTIL HFA, VENTOLIN HFA, PROAIR HFA) inhaler 1 Puff  1 Puff Inhalation Q4H PRN   ??? glucose chewable tablet 16 g  4 Tab Oral PRN   ??? dextrose (D50W) injection syrg 12.5-25 g  25-50 mL IntraVENous PRN   ??? glucagon (GLUCAGEN) injection 1 mg  1 mg IntraMUSCular PRN   ??? acetaminophen (TYLENOL) tablet 650 mg  650 mg Oral Q6H PRN    Or   ??? acetaminophen (TYLENOL) suppository 650 mg  650 mg Rectal Q6H PRN   ??? cefepime (MAXIPIME) 1 g in 0.9% sodium chloride (MBP/ADV) 50 mL MBP  1 g IntraVENous Q24H   ??? metroNIDAZOLE (FLAGYL) IVPB premix 500 mg  500 mg IntraVENous Q12H   ??? acetaminophen (TYLENOL) tablet 650 mg  650 mg Oral Q6H   ??? sodium chloride (NS) flush 5-40 mL  5-40 mL IntraVENous Q8H   ??? sodium chloride (NS) flush 5-40 mL  5-40 mL IntraVENous PRN   ??? polyethylene glycol (MIRALAX) packet 17 g  17 g Oral DAILY PRN   ??? promethazine (PHENERGAN) tablet 12.5 mg  12.5 mg Oral Q6H PRN    Or   ??? ondansetron (ZOFRAN) injection 4 mg  4 mg IntraVENous Q6H PRN

## 2019-06-05 NOTE — Progress Notes (Signed)
Progress Notes by Raynelle Highland, NP at 06/05/19 1337                Author: Raynelle Highland, NP  Service: Nurse Practitioner  Author Type: Acute Care Nurse Practitioner       Filed: 06/05/19 1348  Date of Service: 06/05/19 1337  Status: Attested           Editor: Raynelle Highland, NP (Acute Care Nurse Practitioner)  Cosigner: Rebeca Allegra, MD at 06/06/19 1037          Attestation signed by Rebeca Allegra, MD at 06/06/19 1037          I evaluated this patient, key findings were discussed with the NP,    On exam   CVS: S1S2 heard   Skin: warm    key components of medical care were performed.   Patient hospitalized for AKI (acute kidney injury) (HCC) [N17.9]   Hypernatremia [E87.0]   GI bleed [K92.2]   Care plan discussed with NP, key components of MDM performed   Please see NP note for details. Agree with NP assessment and Plan                                     .     [facility]         ICU Transfer/Accept Summary          This patient is being transferred OUT OF THE ICU   DATE OF TRANSFER: 06/05/2019           PATIENT ID: Michael Lozano   MRN: [id number]    DATE OF BIRTH: June 01, 1934     PRIMARY CARE PROVIDER: Park Pope, NP     DATE OF ADMISSION: 06/02/2019 12:08 PM     ATTENDING PHYSICIAN: Raynelle Highland, NP   CONSULTATIONS:    IP CONSULT TO NEPHROLOGY   IP CONSULT TO GASTROENTEROLOGY   IP CONSULT TO INTENSIVIST   IP CONSULT TO INFECTIOUS DISEASES   IP CONSULT TO PODIATRY   IP CONSULT TO HOSPITALIST      PROCEDURES/SURGERIES:    * No surgery found *      REASON FOR ADMISSION: <principal problem not specified>       HOSPITAL PROBLEM LIST:     Patient Active Problem List        Diagnosis  Code         ?  Cardiomyopathy (HCC)  I42.9     ?  CAD (coronary artery disease)  I25.10     ?  Hypernatremia  E87.0     ?  AKI (acute kidney injury) (HCC)  N17.9         ?  GI bleed  K92.2                Brief HPI and Hospital Course:      "HPI:   Michael Lozano??is a 85 y.o.????M with  PMH of recent COVID, PM, ??DM2, ??And overall??is a very??poor historian.??Information for HPI is obtained via chart and provider.????Per  ER note "He has had a gradual decline, very poor p.o. intake to the point that he has not been eating or drinking for several days. ??He also is somnolent and less interactive. ??He has not fallen or had recurrent fevers. ??No vomiting, diarrhea,  dyspnea."??He apparently lives with is grandson who is 'never home'. While  in ER pt was found to be hypernatremic and hypotensive, ICU was consulted after coffee ground emesis."       04/20: Seen and examined patient sitting up in bed.  States that he is feeling fine wants a hamburger.   Noted to be oriented x3 with intermittent confusion. ? Baseline.         Assessment and Plan:      1.  AKI??on CKD 3 - improving   - Nephrology following, discussed with Dr. Marygrace Drought    - Cr 3.15 this am, baseline 1.7?   - HD not a need at this time per renal   - VF-D5W increased to 150 ml/hr  - slow NA correction   - Sodium bicarb d/c   - Urine output monitor closely   - Strict I and O??   ??   2.  Dehydration   - IVF per nephrology   - Poor PO intake - Carafate ACHS for appetite stimulation   ??   3.  Hypernatremia??- improved   - Serial BMP Q6H    - Na 148 this morning.    ??   5.  COVID +   - Recent covid 4/2 +   - On RA, cont to monitor, add supplemental oxygen to keep sats >92%   - Zinc, Vit C 10 days.    - Per CDC Covid guidelines, likely can come off of isolation precautions as positive test 18 days ago.    ??   6.  Coffee ground emesis??   -CBC daily   -GI signed off- will be available if needed   -PPI BID    -Transfuse hgb <7   -NGT d/c   ??   7.  Leukocytosis??   - Blood cx NGTD   - Wound: 06/04/19 - OCCASIONAL GRAM POSITIVE COCCI; 05/17/19 LIGHT ENTEROBACTER CLOACAE COMPLEX, LIGHT PROVIDENCIA RETTGERI    -UA+, Urine culture 06/02/19 - PSEUDOMONAS LUTEOLA , Culture result: ESCHERICHIA COLI                                                    -Abx per ID, on Cefepime  and Flagyl    ??   8. Non-healing wound on the RLE/ ?OM   - Wound care consulted and following   - ID following, appreciate input    - Podiatry consulted- Recommends wound care and antibiotics.    - Cont broad spectrum coverage    ??   COVID Treatment:   a.  Vit C and Zinc and Vit D   b.  Was first diagnosed 4/2 with COVID PNA, not a candidate for remdesivir or toci    c.  If respiratory status worsens and he is requiring O2 then consider starting  decadron 6 mg Q24 X10 days              PHYSICAL EXAMINATION:   Visit Vitals      BP  119/60     Pulse  61     Temp  97.4 ??F (36.3 ??C)     Resp  9     Ht  5\' 10"  (1.778 m)     Wt  76 kg (167 lb 8.8 oz)     SpO2  100%        BMI  24.04 kg/m??  General:          Alert, cooperative, no distress, answers questions   HEENT:           Atraumatic, MMM             Neck:               Supple, symmetrical,  thyroid: non tender   Lungs:             Clear to auscultation bilaterally on room air.  No Wheezing or Rhonchi. No rales.   Heart:              Regular  rhythm,  No  murmur   No edema   Abdomen:       Soft, non-tender. Not distended.  Bowel sounds normal   Extremities:     No cyanosis.  No clubbing,  +2 distal pulses   Skin:                Not pale.  Not Jaundiced  No rashes. Wound to right foot- dressing is clean, dry, and intact.    Psych:             Not anxious or agitated.   Neurologic:      Alert, moves all extremities, oriented X 2-3, with intermittent confusion.       CODE STATUS:      X  Full Code       DNR       Partial          Comfort Care        Signed:    Raynelle Highland, NP   Date of Service:  06/05/2019   1:37 PM

## 2019-06-05 NOTE — Progress Notes (Signed)
Bedside shift change report given to Tawanna Cooler, Charity fundraiser (oncoming nurse) by Leatha Gilding, RN (offgoing nurse). Report included the following information SBAR, Kardex, Intake/Output, MAR, Recent Results and Cardiac Rhythm Paced.

## 2019-06-05 NOTE — Progress Notes (Signed)
Progress  Notes by Talbert Forest, MD at 06/05/19 1220                Author: Talbert Forest, MD  Service: Nephrology  Author Type: Physician       Filed: 06/05/19 1222  Date of Service: 06/05/19 1220  Status: Signed          Editor: Talbert Forest, MD (Physician)                                                                                                 NAME: Michael Lozano         DOB:  [birth year]         MRN:  [id number]            ??               Assessment   :                                                Plan:      AKI on CKD stage 3 (creatinine~ 1.7 at baseline?)   Rhabdomyolysis   Severe Hypernatremia   Covid   Hypotension   Metabolic acidosis   Hypercalcemia   UTI  Creatinine showing improvement (peak 7.7mg /dl); Down to 3.1mg /dl. Non-oliguric;  prerenal +/- ATN      Na improved to 148 but now stuck.    Increase D5W to 150cc/hr      Oral KCl x1 dose   ??   CK 1862 to 1372      ??COVID treatment->ID on board      Holding Ramipril and statin for now      Stable for transfer out of ICU      Discussed with ICU team             Subjective:        Chief Complaint:  Seen through ICU window (to limit spread of covid and conserve ppe). I spoke with ICU team. UOP 2.6L yesterday. Remains on RA.      Review of Systems:              Symptom  Y/N  Comments    Symptom  Y/N  Comments             Fever/Chills        Chest Pain                 Poor Appetite        Edema                 Cough        Abdominal Pain         Sputum        Joint Pain         SOB/DOE        Pruritis/Rash         Nausea/vomit  Tolerating PT/OT         Diarrhea        Tolerating Diet                 Constipation        Other               Could not obtain due to:            Objective:        VITALS:    Last 24hrs VS reviewed since prior progress note. Most recent are:   Visit Vitals      BP  (!) 119/59     Pulse  64     Temp  97.8 ??F (36.6 ??C)     Resp  13     Ht  5\' 10"  (1.778 m)     Wt  76 kg (167 lb 8.8 oz)     SpO2  100%         BMI  24.04 kg/m??           Intake/Output Summary (Last 24 hours) at 06/05/2019 1220   Last data filed at 06/05/2019 1100     Gross per 24 hour        Intake  3030.83 ml        Output  2175 ml        Net  855.83 ml         Telemetry Reviewed:       PHYSICAL EXAM:   General: NAD         Lab Data Reviewed: (see below)      Medications Reviewed: (see below)      PMH/SH reviewed - no change compared to H&P   ________________________________________________________________________   Care Plan discussed with:       Patient             Family              RN  Y           Care Manager                            Consultant:                     Comments         >50% of visit spent in counseling and coordination of care            ________________________________________________________________________   Hamid Brookens 06/07/2019, MD       Procedures: see electronic medical records for all procedures/Xrays and details which   were not copied into this note but were reviewed prior to creation of Plan.        LABS:     Recent Labs            06/05/19   0428  06/04/19   0531     WBC  10.0  13.2*     HGB  9.3*  8.9*     HCT  28.6*  27.4*         PLT  197  214          Recent Labs                06/05/19   0428  06/04/19   1825  06/04/19   1247  06/03/19   0303  06/03/19   0303  06/02/19   2018     NA  148*  148*  147*    < >  161*  163*     K  3.7  3.6  3.8    < >  4.5  4.9     CL  117*  118*  118*    < >  134*  137*     CO2  23  24  23     < >  16*  17*     BUN  53*  63*  71*    < >  104*  110*     CREA  3.15*  3.84*  4.35*    < >  6.81*  6.60*     GLU  113*  122*  150*    < >  154*  130*     CA  8.1*  7.8*  8.1*    < >  8.3*  8.8     MG   --    --    --    --   3.4*  3.4*     PHOS   --    --    --    --   3.7  4.6        < > = values in this interval not displayed.          Recent Labs             06/05/19   0428  06/04/19   1825  06/04/19   1247     AP  76  77  85     TP  6.9  6.8  7.0     ALB  2.3*  2.3*  2.3*          GLOB  4.6*  4.5*  4.7*           Recent Labs           06/03/19   0303        APTT  29.6         No results for input(s): FE, TIBC, PSAT, FERR in the last 72 hours.    No results found for: FOL, RBCF      Recent Labs            06/03/19   0431  06/02/19   2015     PH  7.37  7.32*     PCO2  27*  29*         PO2  93  91          Recent Labs            06/03/19   0303  06/02/19   1415         CPK  1,372*  1,862*        No components found for: [facility]     Lab Results         Component  Value  Date/Time            Color  DARK YELLOW  06/02/2019 01:00 PM       Appearance  CLOUDY (A)  06/02/2019 01:00 PM       Specific gravity  1.023  06/02/2019 01:00 PM       pH (UA)  5.0  06/02/2019 01:00 PM       Protein  Negative  06/02/2019 01:00 PM       Glucose  Negative  06/02/2019  01:00 PM       Ketone  TRACE (A)  06/02/2019 01:00 PM       Bilirubin  NEGATIVE   08/09/2011 11:25 PM       Urobilinogen  0.2  06/02/2019 01:00 PM       Nitrites  Positive (A)  06/02/2019 01:00 PM       Leukocyte Esterase  SMALL (A)  06/02/2019 01:00 PM       Epithelial cells  FEW  06/02/2019 01:00 PM       Bacteria  Negative  06/02/2019 01:00 PM       WBC  0-4  06/02/2019 01:00 PM            RBC  0-5  06/02/2019 01:00 PM           MEDICATIONS:     Current Facility-Administered Medications          Medication  Dose  Route  Frequency           ?  insulin lispro (HUMALOG) injection     SubCUTAneous  AC&HS     ?  gabapentin (NEURONTIN) capsule 300 mg   300 mg  Oral  BID     ?  [START ON 06/06/2019] zinc sulfate (ZINCATE) 50 mg zinc (220 mg) capsule 1 Cap   1 Cap  Oral  DAILY     ?  ascorbic acid (vitamin C) (VITAMIN C) tablet 500 mg   500 mg  Oral  BID     ?  pantoprazole (PROTONIX) tablet 40 mg   40 mg  Oral  BID     ?  cholecalciferol (VITAMIN D3) (1000 Units /25 mcg) tablet 2,000 Units   2,000 Units  Oral  DAILY     ?  HYDROmorphone (PF) (DILAUDID) injection 0.5 mg   0.5 mg  IntraVENous  Q4H PRN     ?  dextrose 5% infusion   150 mL/hr  IntraVENous  CONTINUOUS     ?  albuterol  (PROVENTIL HFA, VENTOLIN HFA, PROAIR HFA) inhaler 1 Puff   1 Puff  Inhalation  Q4H PRN     ?  glucose chewable tablet 16 g   4 Tab  Oral  PRN     ?  dextrose (D50W) injection syrg 12.5-25 g   25-50 mL  IntraVENous  PRN     ?  glucagon (GLUCAGEN) injection 1 mg   1 mg  IntraMUSCular  PRN     ?  acetaminophen (TYLENOL) tablet 650 mg   650 mg  Oral  Q6H PRN          Or           ?  acetaminophen (TYLENOL) suppository 650 mg   650 mg  Rectal  Q6H PRN     ?  cefepime (MAXIPIME) 1 g in 0.9% sodium chloride (MBP/ADV) 50 mL MBP   1 g  IntraVENous  Q24H     ?  metroNIDAZOLE (FLAGYL) IVPB premix 500 mg   500 mg  IntraVENous  Q12H     ?  acetaminophen (TYLENOL) tablet 650 mg   650 mg  Oral  Q6H     ?  sodium chloride (NS) flush 5-40 mL   5-40 mL  IntraVENous  Q8H     ?  sodium chloride (NS) flush 5-40 mL   5-40 mL  IntraVENous  PRN     ?  polyethylene glycol (MIRALAX) packet 17 g   17 g  Oral  DAILY PRN     ?  promethazine (PHENERGAN) tablet 12.5 mg   12.5 mg  Oral  Q6H PRN          Or           ?  ondansetron (ZOFRAN) injection 4 mg   4 mg  IntraVENous  Q6H PRN

## 2019-06-05 NOTE — Group Note (Signed)
Diabetes  Mgmt by Mittie Bodo, CNS at 06/05/19 6948                Author: Mittie Bodo, CNS  Service: --  Author Type: Clinical Nurse Specialist       Filed: 06/05/19 0919  Date of Service: 06/05/19 0907  Status: Signed          Editor: Mittie Bodo, CNS (Clinical Nurse Specialist)               [place] SPECIALIST CONSULT    FOLLOW UP NOTE        Initial Presentation     Mervyn Pflaum is a 85 y.o.  male with 3 week history of known COVID-19 viral infection who presented to the ED 06/02/19 with his grandson who reported a steady decline in  mentation and ADLs.  Blood work in ED significant for AKI, metabolic acidosis, leukocytosis with hypernatremia and admitted for medical management.        HX:      Past Medical History:        Diagnosis  Date         ?  Chronic pain            diabetic neuropathy         ?  Diabetes mellitus type II, non insulin dependent ([place])       ?  Other ill-defined conditions(799.89)            Gout         ?  Thromboembolus ([place])        PVD   Osteomyelitis      DX: AKI, Rhabdomyolysis, Metabolic Acidosis, UTI      TX: IV Fluids         Hospital course     Clinical progress has been uncomplicated.         Diabetes      Patient has known Type 2 diabetes, treated PTA.       Family history unknown for diabetes.       Admission BG 138 and A1c 6.2% indicate acceptable diabetes control.       Ambulatory blood glucose management provided by primary care provider: Elmer Picker, NP.      Consulted by Reginal Lutes, NP for advanced diabetes nursing assessment and care, specifically  related to    '[x]'  Home management assessment         Diabetes-related medical history   Acute complications: None   Neurological complications   Peripheral neuropathy   Microvascular disease   Nephropathy   Macrovascular disease   Peripheral vascular disease and Foot wounds      Diabetes medication history        Drug class  Currently in use  Discontinued   Never used          Biguanide                DDP-4 inhibitor                 Sulfonylurea                Thiazolidinedione                GLP-1 RA                SGLT-2 inhibitors  Basal insulin                Bolus insulin                Fixed Dose  Combinations                Subjective     Nephrology following: HD on hold, D5 IVF at 160m/hr   Hypernatremia: Na 148 this am   GI consulted for coffee ground emesis: PPI, trending CBC. They signed off   Leukocytosis, UA+ with GNR, Wound cx with light E.cloacae complex, Providencia rettgeri   Podiatry: Osteomyelitis R foot, CT pending   COVID: Decadron [id number] days      A1C 6.2%   Glucose 118-142 in the past 24 hrs   4 units correctional insulin    IV abox: flagyl, cefepime   WBC 10   GFR 19      COVID + on PCR and Rapid     Objective     Physical exam   General Awake, eating in bed, on Room Air.  Able to visualize through ICU doors. Unable to reach via bedside phone.      Visit Vitals      BP  137/65     Pulse  60     Temp  97.5 ??F (36.4 ??C)     Resp  8     Ht  '5\' 10"'  (1.778 m)     Wt  76 kg (167 lb 8.8 oz)     SpO2  98%        BMI  24.04 kg/m??        Deferred due to COVID-19       Laboratory           CBC W/O DIFF          Collection Time: 06/05/19  4:28 AM         Result  Value  Ref Range            WBC  10.0  4.1 - 11.1 K/uL       RBC  3.32 (L)  4.10 - 5.70 M/uL       HGB  9.3 (L)  12.1 - 17.0 g/dL       HCT  28.6 (L)  36.6 - 50.3 %       MCV  86.1  80.0 - 99.0 FL       MCH  28.0  26.0 - 34.0 PG       MCHC  32.5  30.0 - 36.5 g/dL       RDW  16.3 (H)  11.5 - 14.5 %       PLATELET  197  150 - 400 K/uL       MPV  12.0  8.9 - 12.9 FL       NRBC  0.0  0 PER 100 WBC            ABSOLUTE NRBC  0.00  0.00 - 0.01 K/uL                METABOLIC PANEL, COMPREHENSIVE          Collection Time: 06/05/19  4:28 AM         Result  Value  Ref Range            Sodium  148 (H)  136 - 145 mmol/L       Potassium  3.7  3.5 -  5.1 mmol/L       Chloride  117 (H)  97 - 108  mmol/L       CO2  23  21 - 32 mmol/L       Anion gap  8  5 - 15 mmol/L       Glucose  113 (H)  65 - 100 mg/dL       BUN  53 (H)  6 - 20 MG/DL       Creatinine  3.15 (H)  0.70 - 1.30 MG/DL       BUN/Creatinine ratio  17  12 - 20         GFR est AA  23 (L)  >60 ml/min/1.57m       GFR est non-AA  19 (L)  >60 ml/min/1.728m      Calcium  8.1 (L)  8.5 - 10.1 MG/DL       Bilirubin, total  0.7  0.2 - 1.0 MG/DL       ALT (SGPT)  27  12 - 78 U/L       AST (SGOT)  57 (H)  15 - 37 U/L       Alk. phosphatase  76  45 - 117 U/L       Protein, total  6.9  6.4 - 8.2 g/dL       Albumin  2.3 (L)  3.5 - 5.0 g/dL       Globulin  4.6 (H)  2.0 - 4.0 g/dL            A-G Ratio  0.5 (L)  1.1 - 2.2             Factors impacting BG management       Factor  Dose  Comments         Nutrition:   Carb-controlled meals        60 grams/meal              Drugs:   IV antibiotics      Decadron  IV antibiotics         Decadron 26m39maily (Day 2/10)                    Pain  Right foot pain           Infection: COVID-19             UTI  GNR       AKI  GFR 11, Baseline 38?           Other: Foot wound  Podiatry following   Wound cx with light E.cloacae complex, Providencia rettgeri          Blood glucose pattern                Assessment and Plan        Nursing Diagnosis  Risk for unstable blood glucose pattern     Nursing Intervention Domain  5250 Decision-making Support        Nursing Interventions  Examined current inpatient diabetes control    Explored factors facilitating and impeding inpatient management   Identified self-management practices impeding diabetes control   Explored corrective strategies with responsible inpatient provider           Evaluation     ArtMuaad Boehning an 84 41ar old gentleman with a history of Type 2 Diabetes admitted with known [id number] complicated by dehydration, AKI, UTI and right foot wound.  Little  is known about diabetes related history but A1C on admission 6.2% with glucose of 136 indicates adequate glucose  control PTA.  I am unable to find history of antihyperglycemic agents in previous documentation but patient has verbalized that he does not  take medications for diabetes.        Glucose was 200 yesterday due to: D5 IVF for hypernatremia management, decadron 68m once daily, AKI with GFR 11, UTI and foot infection.  Decadron was discontinued and glucose now 113 with D5IVF at 1259mhr without the use of antihyperglycemic agents.   Given glucose is in goal with dextrose in IVF, I expect that it will continue to be in goal if steroids are not resumed.        Recommendations     1. Adjust to non-dextrose containing IVF once appropriate      2. Continue POC glucose ACHS with correctional insulin at HIGH sensitivity (ESRD)      3.  If off dextrose IVF and glucose remains over goal, adjust diet to consistent carbohydrate diet and Use of Subcutaneous Insulin Order set  (1935)       Insulin  Dosing  Specific recommendation         Basal                                      (Based on weight, BMI & GFR)  '[x]'   0.15 units/kg/D (low dose)                  Billing Code(s)     '[x]'  99231      Before making these care recommendations, I personally reviewed the hosptialization record, including laboratory and diagnostic data, medications and examined the patient at bedside (circumstances permitting).   Total minutes:  1557       LaMittie BodoCNS   Diabetes Clinical Nurse Specialist   [facility]   Access via [employer]

## 2019-06-05 NOTE — Progress Notes (Signed)
 Day #4 of Cefepime  Indication:  aspiration PNA, UTI  Current regimen:  2G IV q24h  Abx regimen: Cefepime + Flagyl  Recent Labs     06/05/19  0428 06/04/19  1825 06/04/19  1247 06/04/19  0531 06/03/19  2316   WBC 10.0  --   --  13.2* 16.0*   CREA 3.15* 3.84* 4.35* 4.95* 5.53*   BUN 53* 63* 71* 76* 82*     Est CrCl: ~20 ml/min; UO: 1.4 ml/kg/hr  Temp (24hrs), Avg:97.4 F (36.3 C), Min:96.7 F (35.9 C), Max:97.8 F (36.6 C)    Cultures:   4/17 Urine: Pseudomonas luteola   4/17 Blood: NGTD    Plan: Change to cefepime 2 g q24h for CrCl 11-29 ml/min

## 2019-06-05 NOTE — Progress Notes (Signed)
Infectious Disease Progress Note       Subjective:     Michael Lozano seen  Say his L leg hurts not R foot with ulcer  Discussed with ICU team            Objective:    Vitals:   Patient Vitals for the past 24 hrs:   Temp Pulse Resp BP SpO2   06/05/19 1300 ??? 61 9 119/60 100 %   06/05/19 1200 97.4 ??F (36.3 ??C) 62 11 126/61 100 %   06/05/19 1100 ??? 64 13 (!) 119/59 100 %   06/05/19 1000 ??? 61 (!) 7 124/64 100 %   06/05/19 0900 ??? 60 13 127/66 100 %   06/05/19 0800 97.8 ??F (36.6 ??C) 60 8 137/65 98 %   06/05/19 0700 ??? 60 (!) 31 116/81 99 %   06/05/19 0600 ??? 61 14 127/65 98 %   06/05/19 0500 ??? 61 12 125/62 99 %   06/05/19 0400 97.5 ??F (36.4 ??C) 62 12 124/62 98 %   06/05/19 0300 ??? 63 14 131/63 99 %   06/05/19 0200 ??? 72 18 (!) 96/49 100 %   06/05/19 0100 ??? 62 14 (!) 110/58 98 %   06/05/19 0000 (!) 96.7 ??F (35.9 ??C) 60 21 (!) 116/58 98 %   06/04/19 2300 ??? 69 16 121/67 98 %   06/04/19 2200 ??? 66 17 (!) 99/54 99 %   06/04/19 2100 ??? 62 9 (!) 104/49 99 %   06/04/19 2000 97.7 ??F (36.5 ??C) 61 12 (!) 104/49 99 %   06/04/19 1900 ??? 78 18 (!) 108/53 99 %   06/04/19 1800 ??? 63 16 (!) 105/58 98 %   06/04/19 1700 ??? 61 19 (!) 103/51 99 %   06/04/19 1600 ??? 60 10 (!) 109/54 98 %   06/04/19 1500 ??? 70 12 (!) 93/51 98 %       Physical Exam:  Gen: No apparent distress  HEENT:   No scleral icterus, no thrush   CV: S1,2 heard regularly on monitor  Lungs: non labored breathing   Abdomen: soft, non tender,   Skin: no rash on face, extremities   Psych:  non tearful  Neuro: awake, alert, knew his name, knew he was at [facility], no tremors   Musculoskeletal:  + R plantar wound, no surrounding erythema  Medications:    Current Facility-Administered Medications:   ???  insulin lispro (HUMALOG) injection, , SubCUTAneous, AC&HS, Theodore Demark R, NP-C, 2 Units at 06/05/19 1102  ???  gabapentin (NEURONTIN) capsule 300 mg, 300 mg, Oral, BID, Theodore Demark R, NP-C, 300 mg at 06/05/19 1057  ???  [START ON 06/06/2019] zinc sulfate (ZINCATE) 50 mg zinc (220 mg) capsule 1 Cap,  1 Cap, Oral, DAILY, Theodore Demark R, NP-C  ???  ascorbic acid (vitamin C) (VITAMIN C) tablet 500 mg, 500 mg, Oral, BID, Theodore Demark R, NP-C, 500 mg at 06/05/19 1314  ???  pantoprazole (PROTONIX) tablet 40 mg, 40 mg, Oral, BID, Theodore Demark R, NP-C  ???  cholecalciferol (VITAMIN D3) (1000 Units /25 mcg) tablet 2,000 Units, 2,000 Units, Oral, DAILY, Theodore Demark R, NP-C, 2,000 Units at 06/05/19 1314  ???  sucralfate (CARAFATE) tablet 1 g, 1 g, Oral, AC&HS, Meyer Russel, NP-C  ???  cefepime (MAXIPIME) 2 g in 0.9% sodium chloride (MBP/ADV) 100 mL MBP, 2 g, IntraVENous, Q24H, Matthew, Dellyn R, NP-C  ???  HYDROmorphone (PF) (DILAUDID) injection 0.5 mg, 0.5 mg, IntraVENous, Q4H  PRN, Arta Silence, NP, 0.5 mg at 06/05/19 0454  ???  dextrose 5% infusion, 150 mL/hr, IntraVENous, CONTINUOUS, Patel, Deep V, MD, Last Rate: 150 mL/hr at 06/05/19 1314, 150 mL/hr at 06/05/19 1314  ???  albuterol (PROVENTIL HFA, VENTOLIN HFA, PROAIR HFA) inhaler 1 Puff, 1 Puff, Inhalation, Q4H PRN, Erline Levine, NP  ???  glucose chewable tablet 16 g, 4 Tab, Oral, PRN, Erline Levine, NP  ???  dextrose (D50W) injection syrg 12.5-25 g, 25-50 mL, IntraVENous, PRN, Erline Levine, NP  ???  glucagon (GLUCAGEN) injection 1 mg, 1 mg, IntraMUSCular, PRN, Erline Levine, NP  ???  acetaminophen (TYLENOL) tablet 650 mg, 650 mg, Oral, Q6H PRN **OR** acetaminophen (TYLENOL) suppository 650 mg, 650 mg, Rectal, Q6H PRN, Lockard, Remi Deter, NP  ???  metroNIDAZOLE (FLAGYL) IVPB premix 500 mg, 500 mg, IntraVENous, Q12H, Sinnatamby, Diane S, MD, Last Rate: 100 mL/hr at 06/05/19 0835, 500 mg at 06/05/19 0835  ???  acetaminophen (TYLENOL) tablet 650 mg, 650 mg, Oral, Q6H, Sinnatamby, Diane S, MD, 650 mg at 06/05/19 1100  ???  sodium chloride (NS) flush 5-40 mL, 5-40 mL, IntraVENous, Q8H, Lockard, Sara H, NP, 10 mL at 06/05/19 1315  ???  sodium chloride (NS) flush 5-40 mL, 5-40 mL, IntraVENous, PRN, Lockard, Remi Deter, NP  ???  polyethylene glycol (MIRALAX) packet 17 g, 17 g, Oral, DAILY PRN,  Jessie Foot, Remi Deter, NP  ???  promethazine (PHENERGAN) tablet 12.5 mg, 12.5 mg, Oral, Q6H PRN **OR** ondansetron (ZOFRAN) injection 4 mg, 4 mg, IntraVENous, Q6H PRN, Arta Silence, NP, 4 mg at 06/05/19 1320      Labs:  Recent Results (from the past 24 hour(s))   GLUCOSE, POC    Collection Time: 06/04/19  6:16 PM   Result Value Ref Range    Glucose (POC) 128 (H) 65 - 100 mg/dL    Performed by Myer Haff RN    METABOLIC PANEL, COMPREHENSIVE    Collection Time: 06/04/19  6:25 PM   Result Value Ref Range    Sodium 148 (H) 136 - 145 mmol/L    Potassium 3.6 3.5 - 5.1 mmol/L    Chloride 118 (H) 97 - 108 mmol/L    CO2 24 21 - 32 mmol/L    Anion gap 6 5 - 15 mmol/L    Glucose 122 (H) 65 - 100 mg/dL    BUN 63 (H) 6 - 20 MG/DL    Creatinine 3.84 (H) 0.70 - 1.30 MG/DL    BUN/Creatinine ratio 16 12 - 20      GFR est AA 18 (L) >60 ml/min/1.67m    GFR est non-AA 15 (L) >60 ml/min/1.772m   Calcium 7.8 (L) 8.5 - 10.1 MG/DL    Bilirubin, total 0.6 0.2 - 1.0 MG/DL    ALT (SGPT) 23 12 - 78 U/L    AST (SGOT) 59 (H) 15 - 37 U/L    Alk. phosphatase 77 45 - 117 U/L    Protein, total 6.8 6.4 - 8.2 g/dL    Albumin 2.3 (L) 3.5 - 5.0 g/dL    Globulin 4.5 (H) 2.0 - 4.0 g/dL    A-G Ratio 0.5 (L) 1.1 - 2.2     CULTURE, WOUND W GRAM STAIN    Collection Time: 06/04/19  6:25 PM    Specimen: Ulcer; Wound   Result Value Ref Range    Special Requests: NO SPECIAL REQUESTS      GRAM STAIN OCCASIONAL WBCS SEEN  GRAM STAIN 1+ GRAM VARIABLE RODS      GRAM STAIN OCCASIONAL GRAM POSITIVE COCCI IN PAIRS      Culture result: PENDING    GLUCOSE, POC    Collection Time: 06/04/19 11:28 PM   Result Value Ref Range    Glucose (POC) 142 (H) 65 - 100 mg/dL    Performed by BENOIT TODD    METABOLIC PANEL, COMPREHENSIVE    Collection Time: 06/05/19  4:28 AM   Result Value Ref Range    Sodium 148 (H) 136 - 145 mmol/L    Potassium 3.7 3.5 - 5.1 mmol/L    Chloride 117 (H) 97 - 108 mmol/L    CO2 23 21 - 32 mmol/L    Anion gap 8 5 - 15 mmol/L    Glucose 113 (H) 65 - 100  mg/dL    BUN 53 (H) 6 - 20 MG/DL    Creatinine 3.15 (H) 0.70 - 1.30 MG/DL    BUN/Creatinine ratio 17 12 - 20      GFR est AA 23 (L) >60 ml/min/1.70m    GFR est non-AA 19 (L) >60 ml/min/1.775m   Calcium 8.1 (L) 8.5 - 10.1 MG/DL    Bilirubin, total 0.7 0.2 - 1.0 MG/DL    ALT (SGPT) 27 12 - 78 U/L    AST (SGOT) 57 (H) 15 - 37 U/L    Alk. phosphatase 76 45 - 117 U/L    Protein, total 6.9 6.4 - 8.2 g/dL    Albumin 2.3 (L) 3.5 - 5.0 g/dL    Globulin 4.6 (H) 2.0 - 4.0 g/dL    A-G Ratio 0.5 (L) 1.1 - 2.2     LACTIC ACID    Collection Time: 06/05/19  4:28 AM   Result Value Ref Range    Lactic acid 1.4 0.4 - 2.0 MMOL/L   CBC W/O DIFF    Collection Time: 06/05/19  4:28 AM   Result Value Ref Range    WBC 10.0 4.1 - 11.1 K/uL    RBC 3.32 (L) 4.10 - 5.70 M/uL    HGB 9.3 (L) 12.1 - 17.0 g/dL    HCT 28.6 (L) 36.6 - 50.3 %    MCV 86.1 80.0 - 99.0 FL    MCH 28.0 26.0 - 34.0 PG    MCHC 32.5 30.0 - 36.5 g/dL    RDW 16.3 (H) 11.5 - 14.5 %    PLATELET 197 150 - 400 K/uL    MPV 12.0 8.9 - 12.9 FL    NRBC 0.0 0 PER 100 WBC    ABSOLUTE NRBC 0.00 0.00 - 0.01 K/uL   GLUCOSE, POC    Collection Time: 06/05/19  5:33 AM   Result Value Ref Range    Glucose (POC) 118 (H) 65 - 100 mg/dL    Performed by BECorrie MckusickODD    GLUCOSE, POC    Collection Time: 06/05/19 11:00 AM   Result Value Ref Range    Glucose (POC) 159 (H) 65 - 100 mg/dL    Performed by CaLudwig Lean          Micro:     Blood: 06/02/19  Special Requests: NO SPECIAL REQUESTS  ?? Preliminary   Culture result: NO GROWTH 2 DAYS              Urine:   Specimen Information: Cath Urine   ??    Component Value Ref Range & Units Status   Special Requests: NO SPECIAL REQUESTS  ??  Preliminary   Colony Count >100,000   COLONIES/mL  ?? Preliminary   Culture result: GRAM NEGATIVE RODSAbnormal           05/17/19 Wound   Foot; Wound    ??    Component Value Ref Range & Units Status   Special Requests: NO SPECIAL REQUESTS  ?? Final   GRAM STAIN NO WBC'S SEEN  ?? Final   GRAM STAIN FEW GRAM POSITIVE  COCCI  ?? Final   GRAM STAIN FEW GRAM POSITIVE RODS  ?? Final   Culture result: Abnormal   ?? Final   LIGHT ENTEROBACTER CLOACAE COMPLEX    Culture result: LIGHT PROVIDENCIA RETTGERIAbnormal   ?? Final   Culture result:  ?? Final   LIGHT MIXED SKIN FLORA ISOLATED    Susceptibility     Enterobacter cloacae complex Providencia rettgeri     MIC MIC    Amikacin ($) <=2 ug/mL S <=2 ug/mL S    Ampicillin ($)   >=32 ug/mL R    Ampicillin/sulbactam ($)   >=32 ug/mL R    Cefazolin ($) >=64 ug/mL R >=64 ug/mL R    Cefepime ($$) <=1 ug/mL S <=1 ug/mL S    Cefoxitin >=64 ug/mL R <=4 ug/mL S    Ceftazidime ($) <=1 ug/mL S <=1 ug/mL S    Ceftriaxone ($) <=1 ug/mL S <=1 ug/mL S    Ciprofloxacin ($) <=0.25 ug/mL S <=0.25 ug/mL S    Gentamicin ($) <=1 ug/mL S <=1 ug/mL S    Levofloxacin ($) <=0.12 ug/mL S <=0.12 ug/mL S    Meropenem ($$) <=0.25 ug/mL S <=0.25 ug/mL S    Piperacillin/Tazobac ($) <=4 ug/mL S <=4 ug/mL S    Tobramycin ($) 8 ug/mL I <=1 ug/mL S    Trimeth/Sulfa >=320 ug/mL R              Result History          Pathology:  08/12/2011  FINAL PATHOLOGIC DIAGNOSIS  1. Bone, left fifth metatarsal head, biopsy:  Acute osteomyelitis  2. Bone, left fifth metatarsal head, biopsy:  Acute osteomyelitis      Imaging:  CT RLE  ??  FINDINGS: The evaluation for infection is partially limited secondary to the  lack of IV contrast. There is an ulcer plantar to the second MTP joint measuring  17 x 20 mm. There is no evidence of a drainable fluid collection. There is no  subcutaneous emphysema.  ??  The bones are mildly osteopenic. There is dorsal subluxation at the first MTP  joint. There are extensive erosive changes involving the first MTP joint that  have a chronic appearance. The patient is status post fifth MTP arthrotomy. No  acute fracture or dislocation. No definite evidence of acute osteomyelitis.  ??  IMPRESSION  ??  1. Ulcer plantar to the second MTP joint. No evidence of a focal drainable fluid  collection.  2. Dorsal subluxation at  the first MTP joint with erosive changes that appear  chronic. These findings may be related to inflammatory arthropathy, gout, or  prior septic arthritis. No definite evidence of acute osteomyelitis.    CT A/P  FINDINGS:   LOWER THORAX: Atelectasis at the lung bases  LIVER: Probable cyst left hepatic lobe  BILIARY TREE: Stones in a nondistended gallbladder. CBD is not dilated.  SPLEEN: within normal limits.  PANCREAS: No focal abnormality.  ADRENALS: Unremarkable.  KIDNEYS/URETERS: Probable cyst left kidney but no hydronephrosis  STOMACH: Unremarkable.  SMALL BOWEL:  No dilatation or wall thickening.  COLON: Diverticulosis of the left colon. No evidence of acute diverticulitis  APPENDIX: Surgically absent  PERITONEUM: No ascites or pneumoperitoneum.  RETROPERITONEUM: No lymphadenopathy or aortic aneurysm. There are vascular  calcifications  REPRODUCTIVE ORGANS: Not enlarged  URINARY BLADDER: Decompressed by Foley  BONES: No destructive bone lesion.  ABDOMINAL WALL: Umbilical hernia containing fat  ADDITIONAL COMMENTS: N/A  ??  IMPRESSION  ??  1. No hydronephrosis  2. Diverticulosis. No evidence of acute diverticulitis  3. Gallstones. No evidence of acute cholecystitis    CT head  ??  FINDINGS:  Intervals and cisterns are mildly enlarged compatible with the overall degree of  volume loss. Encephalomalacia right posterior frontal lobe and parietal lobe.  Mild low density within the periventricular white matter. There is no  intracranial hemorrhage, extra-axial collection, or mass effect. The basilar  cisterns are open. No CT evidence of acute infarct.  ??  The bone windows demonstrate no abnormalities. Minimal sinus mucosal  inflammatory change.  ??  IMPRESSION    Encephalomalacia posterior left frontal and parietal lobe. No acute intracranial  Abnormality    06/02/19  Indication: Coffee-ground emesis, leukocytosis  ??  Comparison to earlier the same day. Portable exam obtained at [facility] demonstrates  minimal right lower lobe  infiltrate, slightly increased compared to the prior  exam.  ??  IMPRESSION  Minimal right lower lobe infiltrate is slightly increased in the  interval.  ??    06/02/19  FINDINGS: Three views of the right foot demonstrate that is post surgical  correction of the distal fifth digit. Hyperextension at the first MTP joint with  erosive change.. The soft tissues are within normal limits.  ??  IMPRESSION  ??  No definite plain film evidence for ostomy myelitis..    Assessment / Plan    Michael Lozano is a 85 year old gentleman with a history of diabetes, left diabetic foot infection, osteomyelitis status post left fifth metatarsal head resection in 2013, chronic right plantar wound diagnosed, indwelling cardiac device/pacemaker with SARS-CoV-2 on 05/18/2019. He was seen in the emergency room on 4/2 and 05/28/2019.  He presented with fever generalized body aches on 05/18/2019 and had a positive rapid SARS-CoV-2.  Appears he was discharged home and went back to the ER on 05/28/2019 with a chief complaint of fatigue and not eating or drinking.  He was brought to the emergency room by his grandson per the ER note with decline and very poor p.o. intake.  There was also concerns for periods of disorientation and confusion.  Initially found to be hyponatremic with a sodium of 163 and a creatinine of 6.6 and a BUN of 130.  Labs also showed elevated CK, leukocytosis in the range of 12-16, anemia, elevated inflammatory markers.  His urine on 06/02/2019 had 0-4 white cells and negative for bacteria.  Blood culture sent during this admission is negative.  Urine culture shows greater than 100,000 gram-negative rods.  Wound culture report in his foot wound from 05/17/19 resulted with Enterobacter cloacae and Providencia rettgeri, light mixed skin flora.  He has had imaging with an x-ray as well as a CT scan of the right lower extremity and reported as no evidence of focal drainable fluid collection and no evidence of acute osteomyelitis.  He was  started on antibiotics which included vancomycin cefepime and Flagyl.      1) chronic right lower extremity plantar wound, DM foot wound, Wound cx  05/17/19 + for Enterobacter and Providentia  2)severe  hypernatremia  3) AKI  4) COVID + 05/17/19 , 06/02/19, on room air   5)Concern for GI bleed and risk for aspiration   6) DM  7) Urine cx wt Pseudomonas, Ecoli        In regards to the right plantar wound, appears chronic without any cellulitis  Wound cultures from 05/17/2019 + for Enterobacter , Providentia for which he is on Cefepime.  Cefepime can cause akinetic seizures and altered mental status in the elderly and continue to monitor.  Seems clinically patient is improved and will continue for now  Continue flagyl given risk for aspiration and coverage    will hold vancomycin and monitor as there are no signs of cellulitis around the right plantar wound  Needs good wound care  Seen by podiatry  Supportive care for SARS-CoV-2              Thank you for the opportunity to participate in the care of this patient.   Please contact with questions or concerns.      Narcisa Ganesh R Tiffny Gemmer, DO   2:04 PM

## 2019-06-05 NOTE — Progress Notes (Signed)
 TOC: anticipate d/c to SNF pending clinical progression and therapy recommendations;  Covid positive; Will need insurance auth & Covid test for SNF placment; BLS transport; Will need 2nd IMM letter prior to d/c    RUR: 20%  Reason for Admission:   AKI, Covid positive, Hypothermia, GIB                    RUR Score:   20%               PCP: First and Last name:   Wolm Mariel BRAVO, NP     Name of Practice:    Are you a current patient: Yes/No: YES   Approximate date of last visit: within last 6 months    Can you participate in a virtual visit if needed:   YES  Do you (patient/family) have any concerns for transition/discharge?                 Daughter expressed concerns that pt needs extra help with ADLS at home and thinks pt will need rehab at d/c. Daughter also expressed that pt has had difficulty affording medications.  Plan for utilizing home health:     Anticipate d/c to SNF pending clinical progression  Current Advanced Directive/Advance Care Plan:  Full Code      Healthcare Decision Maker:   Click here to complete HealthCare Decision Makers including selection of the Healthcare Decision Maker Relationship (ie Primary)          Michael Lozano, daughter, [phone]    Transition of Care Plan:        1220-CM reviewed pt chart & contacted pt's daughter, Michael Lozano, [phone], in lieu of covid.  As per dtr, pt resides with grand-daughter, Michael Lozano, [phone], in a split level home with 7 steps to entrance. Home has a very steep driveway and has 7 steps to access 2nd floor bedroom.    Prior to admission, pt was needing more assistance with ADLs & IADLs. Pt has dme of cane only. Pt uses [facility] for meds with daughter expressed that pt has had difficulty affording medications.     Cm confirmed pcp and demographics.    Daughter reported that pt had prior [facility] for wound care a few years and a prior stay in two nursing homes for rehab, one in [place], [place] area as well as [facility].     Anticipate  BLS transport.  CM to follow.  Slater Perna RN BSN CCM  Care Management Interventions  PCP Verified by CM: Yes  Palliative Care Criteria Met (RRAT>21 & CHF Dx)?: No  Mode of Transport at Discharge: BLS  Transition of Care Consult (CM Consult): Discharge Planning  Discharge Durable Medical Equipment: No(pt owns a cane )  Health Maintenance Reviewed: Yes  Current Support Network: Own Home(lives with grand daughter )  Confirm Follow Up Transport: (BLS transport )  The Plan for Transition of Care is Related to the Following Treatment Goals : medical and therapy clearance   Discharge Location  Discharge Placement: Skilled nursing facility

## 2019-06-05 NOTE — Progress Notes (Signed)
Progress Notes by Jeanell Sparrow, NP-C at 06/05/19 1134                Author: Jeanell Sparrow, NP-C  Service: Internal Medicine  Author Type: Nurse Practitioner       Filed: 06/05/19 1249  Date of Service: 06/05/19 1134  Status: Signed          Editor: Jeanell Sparrow, NP-C (Nurse Practitioner)                        [facility]      ICU TEAM Progress Note         Name:  Michael Lozano        DOB:  February 18, 1934     MRN:  [id number]        Date:  06/05/2019           Assessment:     Reason for ICU Admission: GIB, Hypernatremia   ??   HPI:   Michael Lozano??is a 85 y.o.????M with PMH of recent COVID, PM, ??DM2,  And overall is a very poor historian. Information for HPI is obtained via chart and provider.  Per ER note "He has had a gradual decline, very poor p.o. intake to the point  that he has not been eating or drinking for several days. ??He also is somnolent and less interactive. ??He has not fallen or had recurrent fevers. ??No vomiting, diarrhea, dyspnea." He apparently lives with is grandson who is 'never home'. While  in ER pt was found to be hypernatremic and hypotensive, ICU was consulted after coffee ground emesis.         Subjective:     Overnight events:   06/05/19   No acute events overnight   No further coffee ground emesis noted. Denies N/V   NA correcting per Nephrology. Continue Dextrose gtt, increased to 168ml/hr per nephro   No DM2 Hbg A1c 6.2, may have had in the past   Neuropathy limb pain, complains of more pain in lower left foot, (foot without wound, esp in last 3 toes) - restart gabapentin, dose lowered due to renal function   ID following - continue cefepime and flagyl   Wound care for right foot wound.   Transfer to telemetry today      POD:   * No surgery found *      S/P:            Plan:        ICU Problems:   1.  AKI on CKD 3 - improving   - Nephrology following, discussed with Dr. Enrique Sack    - Cr 3.15 this am, baseline 1.7?   - HD not a need at this time per renal   - VF-D5W  increased to 150 ml/hr  - slow NA correction   - Sodium bicarb d/c   - Urine output monitor closely   - Strict I and O    ??   2.  Dehydration   - IVF per nephrology   - Poor PO intake - Carafate ACHS for appetite stimulation      3.  Hypernatremia - improved   - Serial BMP Q6H    ??   5.  COVID +   - Recent covid 4/2 +   - On RA, cont to monitor, add supplemental oxygen to keep sats >92%   - Zinc, Vit C 10 days.   ??   6.  Coffee ground emesis    -CBC daily   -GI following   -PPI BID    -Transfuse hgb <7   -NGT d/c   ??   7.  Leukocytosis    - Blood cx NGTD   - Wound: 06/04/19 - OCCASIONAL GRAM POSITIVE COCCI; 05/17/19 LIGHT ENTEROBACTER CLOACAE COMPLEX, LIGHT PROVIDENCIA RETTGERI    -UA+, Urine culture 06/02/19 - PSEUDOMONAS LUTEOLA , Culture result: ESCHERICHIA COLI        -Abx per ID, on Cefepime and Flagyl       8. Non-healing wound on the RLE/ ?OM   - Wound care consulted and following   - ID following, appreciate input    - Podiatry consulted, appreciate input   - Cont broad spectrum coverage       COVID Treatment:   a.  Vit C and Zinc and Vit D   b.  Was first diagnosed 4/2 with COVID PNA, not a candidate for remdesivir or toci    c.  If respiratory status worsens and he is requiring O2 then consider starting  decadron 6 mg Q24 X10 days.      F - Feeding:  Reg diet   A - Analgesia: Dilaudid and Acetaminophen   S - Sedation: None   T - DVT Prophylaxis: SCD's or Sequential Compression Device    C - Code Status: Full Code   H - Head of Bed: > 30 Degrees   U - Ulcer Prophylaxis: Protonix (pantoprazole)  PO   G - Glycemic Control: Insulin SSI   S - Spontaneous Breathing Trial: No   B - Bowel Regimen: Docusate (Colace)   I - Indwelling Catheter:    Tubes: None   Lines: Peripheral IV   Drains: None   D - De-escalation of Antibiotics: per ID, Cefepime, Flagyl        Objective:        Visit Vitals      BP  (!) 119/59     Pulse  64     Temp  97.8 ??F (36.6 ??C)     Resp  13     Ht  5\' 10"  (1.778 m)     Wt  76 kg (167 lb 8.8 oz)      SpO2  100%        BMI  24.04 kg/m??         O2 Device: None (Room air) Temp (24hrs), Avg:97.4 ??F (36.3 ??C), Min:96.7 ??F (35.9 ??C), Max:97.8 ??F (36.6 ??C)               Hemodynamics:         PAP:     CO:        Wedge:     CI:        CVP:      SVR:                 PVR:           Ventilator Settings:          Mode  Rate  Tidal Volume  Pressure  FiO2  PEEP                                        Peak airway pressure:            Minute ventilation:  Intake/Output:       Intake/Output Summary (Last 24 hours) at 06/05/2019 1243   Last data filed at 06/05/2019 1100     Gross per 24 hour        Intake  3030.83 ml        Output  2175 ml        Net  855.83 ml           Physical Exam:      General:   Awake, alert, oriented, looks emaciated      Eyes:   Sclera anicteric. Pupils equally round and reactive to light.     Mouth/Throat:  Mucous membranes dry, oral pharynx clear     Neck:  Supple     Lungs:    Clear to auscultation bilaterally, good effort     CV:   Regular rate and rhythm,no murmur, click, rub or gallop. Pacer left chest wall      Abdomen:    Soft, non-tender. bowel sounds normal. non-distended     Extremities:  No cyanosis or edema     Skin:  Skin color, texture, turgor normal. Wound with dressing intact to right foot.      Lymph nodes:  Cervical and supraclavicular normal     Musculoskeletal:  No swelling or deformity     Lines/Devices:   Intact, no erythema, drainage or tenderness     Neuro-  Alert to self and place, generalized weakness, follows commands appropriately, MAE.              Labs & Data: Reviewed      Medications: Reviewed      Chest X-Ray:   06/02/19   Study Result        Indication: Coffee-ground emesis, leukocytosis   ??   Comparison to earlier the same day. Portable exam obtained at 1942 demonstrates   minimal right lower lobe infiltrate, slightly increased compared to the prior   exam.   ??   IMPRESSION   Minimal right lower lobe infiltrate is slightly increased in the   interval.   ??         X-ray Rt foot Min 3 view 06/02/19   FINDINGS: Three views of the right foot demonstrate that is post surgical   correction of the distal fifth digit. Hyperextension at the first MTP joint with   erosive change.. The soft tissues are within normal limits.   ??   IMPRESSION   ??   No definite plain film evidence for ostomy myelitis..      TTE:    08/09/2011   SUMMARY:   Procedure information: Systolic blood pressure was 109 mmHg, at the start   of the study. Diastolic blood pressure was 62 mmHg, at the start of the    study.     Left ventricle: Systolic function was normal. Ejection fraction was   estimated to be 40 %. There was moderate hypokinesis of the mid   anteroseptal, basal-mid inferior, and apical wall(s).     Tricuspid valve: There was  mild regurgitation.       Multidisciplinary Rounds Completed:  Yes      ABCDEF Bundle/Checklist Completed: Yes      SPECIAL EQUIPMENT: None      DISPOSITION: Transfer to non-ICU bed      CRITICAL CARE CONSULTANT NOTE   I had a face to face encounter with the patient, reviewed and interpreted patient data including clinical events, labs, images, vital signs, I/O's, and examined  patient.  I have discussed the case and  the plan and management of the patient's care with the consulting services, the bedside nurses and the respiratory therapist.        NOTE OF PERSONAL INVOLVEMENT IN CARE    This patient has a high probability of imminent, clinically significant deterioration, which requires the highest level of preparedness to intervene urgently. I participated in the decision-making and personally managed or directed the management of the  following life and organ supporting interventions that required my frequent assessment to treat or prevent imminent deterioration.      I personally spent 40 minutes of critical care time.  This is time spent at this critically ill patient's bedside actively involved in patient care as well as the coordination of care and discussions  with  the patient's family.  This does not include any procedural time which has been billed separately.         Jeanell Sparrow AGACNP-BC      Critical Care Medicine   [facility]

## 2019-06-05 NOTE — ACP (Advance Care Planning) (Signed)
Advance Care Planning     Advance Care Planning Activator (Inpatient)  Conversation Note      Date of ACP Conversation: 06/05/19     Conversation Conducted with:  Daughter, Michael Lozano, [phone]  ACP Activator: Lurena Nida    Health Care Decision Maker:    Current Designated Health Care Decision Maker: Daughter, Michael Lozano, [phone]      Care Preferences    Ventilation:  "If you were in your present state of health and suddenly became very ill and were unable to breathe on your own, what would your preference be about the use of a ventilator (breathing machine) if it were available to you?"      If patient would desire the use of a ventilator (breathing machine), answer YES    "If your health worsens and it becomes clear that your chance of recovery is unlikely, what would your preference be about the use of a ventilator (breathing machine) if it were available to you?"     Would the patient desire the use of a ventilator (breathing machine)?  YES      Resuscitation  "CPR works best to restart the heart when there is a sudden event, like a heart attack, in someone who is otherwise healthy. Unfortunately, CPR does not typically restart the heart for people who have serious health conditions or who are very sick."    "In the event your heart stopped as a result of an underlying serious health condition, would you want attempts to be made to restart your heart (answer "yes" for attempt to resuscitate) or would you prefer a natural death (answer "no" for do not attempt to resuscitate)?" YES      [x]  Yes  []  No   Educated Patient / Decision Maker regarding differences between Advance Directives and portable DNR orders.    Length of ACP Conversation in minutes:  10 minutes   Conversation Outcomes:  [x]  ACP discussion completed  []  Existing advance directive reviewed with patient; no changes to patient's previously recorded wishes     []  New Advance Directive completed   []  Portable Do Not Resuscitate prepared  for Provider review and signature  []  POLST/POST/MOLST/MOST prepared for Provider review and signature      Follow-up plan:    []  Schedule follow-up conversation to continue planning  []  Referred individual to Provider for additional questions/concerns   [x]  Advised patient/agent/surrogate to review completed ACP document and update if needed with changes in condition, patient preferences or care setting     []  This note routed to one or more involved healthcare providers

## 2019-06-06 LAB — CBC W/O DIFF
ABSOLUTE NRBC: 0 10*3/uL (ref 0.00–0.01)
HCT: 31.8 % — ABNORMAL LOW (ref 36.6–50.3)
HGB: 10.2 g/dL — ABNORMAL LOW (ref 12.1–17.0)
MCH: 28 PG (ref 26.0–34.0)
MCHC: 32.1 g/dL (ref 30.0–36.5)
MCV: 87.4 FL (ref 80.0–99.0)
MPV: 12.2 FL (ref 8.9–12.9)
NRBC: 0 PER 100 WBC
PLATELET: 197 10*3/uL (ref 150–400)
RBC: 3.64 M/uL — ABNORMAL LOW (ref 4.10–5.70)
RDW: 16.6 % — ABNORMAL HIGH (ref 11.5–14.5)
WBC: 6.9 10*3/uL (ref 4.1–11.1)

## 2019-06-06 LAB — GLUCOSE, POC
Glucose (POC): 154 mg/dL — ABNORMAL HIGH (ref 65–100)
Glucose (POC): 130 mg/dL — ABNORMAL HIGH (ref 65–100)
Glucose (POC): 153 mg/dL — ABNORMAL HIGH (ref 65–100)
Glucose (POC): 172 mg/dL — ABNORMAL HIGH (ref 65–100)

## 2019-06-06 LAB — METABOLIC PANEL, BASIC
Anion gap: 7 mmol/L (ref 5–15)
BUN/Creatinine ratio: 16 (ref 12–20)
BUN: 36 MG/DL — ABNORMAL HIGH (ref 6–20)
CO2: 22 mmol/L (ref 21–32)
Calcium: 8 MG/DL — ABNORMAL LOW (ref 8.5–10.1)
Chloride: 114 mmol/L — ABNORMAL HIGH (ref 97–108)
Creatinine: 2.32 MG/DL — ABNORMAL HIGH (ref 0.70–1.30)
GFR est AA: 33 mL/min/{1.73_m2} — ABNORMAL LOW (ref >60)
GFR est non-AA: 27 mL/min/{1.73_m2} — ABNORMAL LOW (ref >60)
Glucose: 134 mg/dL — ABNORMAL HIGH (ref 65–100)
Potassium: 3.9 mmol/L (ref 3.5–5.1)
Sodium: 143 mmol/L (ref 136–145)

## 2019-06-06 LAB — MAGNESIUM
Magnesium: 2.3 mg/dL (ref 1.6–2.4)
Magnesium: 2.3 mg/dL (ref 1.6–2.4)

## 2019-06-06 LAB — PHOSPHORUS
Phosphorus: 2.1 MG/DL — ABNORMAL LOW (ref 2.6–4.7)
Phosphorus: 2.1 MG/DL — ABNORMAL LOW (ref 2.6–4.7)

## 2019-06-06 LAB — LACTIC ACID
Lactic Acid: 2.1 MMOL/L (ref 0.4–2.0)
Lactic acid: 2.1 MMOL/L — CR (ref 0.4–2.0)

## 2019-06-06 LAB — BASIC METABOLIC PANEL
Anion Gap: 7 mmol/L (ref 5–15)
BUN: 36 MG/DL — ABNORMAL HIGH (ref 6–20)
Bun/Cre Ratio: 16 (ref 12–20)
CO2: 22 mmol/L (ref 21–32)
Calcium: 8 MG/DL — ABNORMAL LOW (ref 8.5–10.1)
Chloride: 114 mmol/L — ABNORMAL HIGH (ref 97–108)
Creatinine: 2.32 MG/DL — ABNORMAL HIGH (ref 0.70–1.30)
EGFR IF NonAfrican American: 27 mL/min/{1.73_m2} — ABNORMAL LOW (ref >60)
GFR African American: 33 mL/min/{1.73_m2} — ABNORMAL LOW (ref >60)
Glucose: 134 mg/dL — ABNORMAL HIGH (ref 65–100)
Potassium: 3.9 mmol/L (ref 3.5–5.1)
Sodium: 143 mmol/L (ref 136–145)

## 2019-06-06 LAB — POCT GLUCOSE
POC Glucose: 154 mg/dL — ABNORMAL HIGH (ref 65–100)
POC Glucose: 130 mg/dL — ABNORMAL HIGH (ref 65–100)
POC Glucose: 153 mg/dL — ABNORMAL HIGH (ref 65–100)
POC Glucose: 172 mg/dL — ABNORMAL HIGH (ref 65–100)

## 2019-06-06 LAB — CBC
Hematocrit: 31.8 % — ABNORMAL LOW (ref 36.6–50.3)
Hemoglobin: 10.2 g/dL — ABNORMAL LOW (ref 12.1–17.0)
MCH: 28 PG (ref 26.0–34.0)
MCHC: 32.1 g/dL (ref 30.0–36.5)
MCV: 87.4 FL (ref 80.0–99.0)
MPV: 12.2 FL (ref 8.9–12.9)
NRBC Absolute: 0 10*3/uL (ref 0.00–0.01)
Nucleated RBCs: 0 PER 100 WBC
Platelets: 197 10*3/uL (ref 150–400)
RBC: 3.64 M/uL — ABNORMAL LOW (ref 4.10–5.70)
RDW: 16.6 % — ABNORMAL HIGH (ref 11.5–14.5)
WBC: 6.9 10*3/uL (ref 4.1–11.1)

## 2019-06-06 MED ORDER — ALBUMIN, HUMAN 25 % IV
25 % | Freq: Three times a day (TID) | INTRAVENOUS | Status: DC | PRN
Start: 2019-06-06 — End: 2019-06-18

## 2019-06-06 MED FILL — ZINC-220 50 MG ZINC (220 MG) CAPSULE: 50 mg zinc (220 mg) | ORAL | Qty: 1

## 2019-06-06 MED FILL — ACETAMINOPHEN 325 MG TABLET: 325 mg | ORAL | Qty: 2

## 2019-06-06 MED FILL — VITAMIN C 500 MG TABLET: 500 mg | ORAL | Qty: 1

## 2019-06-06 MED FILL — GABAPENTIN 300 MG CAP: 300 mg | ORAL | Qty: 1

## 2019-06-06 MED FILL — DOK 100 MG CAPSULE: 100 mg | ORAL | Qty: 1

## 2019-06-06 MED FILL — CHOLECALCIFEROL (VITAMIN D3) 1,000 UNIT (25 MCG) TAB: ORAL | Qty: 2

## 2019-06-06 MED FILL — INSULIN LISPRO 100 UNIT/ML INJECTION: 100 unit/mL | SUBCUTANEOUS | Qty: 1

## 2019-06-06 MED FILL — SUCRALFATE 1 GRAM TAB: 1 gram | ORAL | Qty: 1

## 2019-06-06 MED FILL — METRO I.V. 500 MG/100 ML INTRAVENOUS PIGGYBACK: 500 mg/100 mL | INTRAVENOUS | Qty: 100

## 2019-06-06 MED FILL — PANTOPRAZOLE 40 MG TAB, DELAYED RELEASE: 40 mg | ORAL | Qty: 1

## 2019-06-06 MED FILL — VENTOLIN HFA 90 MCG/ACTUATION AEROSOL INHALER: 90 mcg/actuation | RESPIRATORY_TRACT | Qty: 8
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
## 2019-06-06 NOTE — Progress Notes (Addendum)
Bedside shift change report given to Mo, RN (oncoming nurse) by Regan, RN (offgoing nurse). Report included the following information SBAR, Kardex, ED Summary, OR Summary, Intake/Output, MAR, Accordion, Recent Results and Cardiac Rhythm V-Paced.       0350 - pt arrived to unit.  0413 - Critical lactic: 2.1 - NP notified. No orders.        TRANSFER - IN REPORT:    Verbal report received from Todd, RN (name) on Michael Lozano  being received from ICU (unit) for routine progression of care      Report consisted of patient???s Situation, Background, Assessment and   Recommendations(SBAR).     Information from the following report(s) SBAR, Kardex, ED Summary, OR Summary, Intake/Output, MAR, Accordion, Recent Results and Cardiac Rhythm V Paced was reviewed with the receiving nurse.    Opportunity for questions and clarification was provided.      Assessment completed upon patient???s arrival to unit and care assumed.

## 2019-06-06 NOTE — Progress Notes (Signed)
ID follow up note  Chart reviewed  Briefly discussed with Podiatry yesterday   Wound is chronic in R foot without surrounding cellulitis   Urine with Pseudomonas and ecoli  Suspect dehydration to play a large role in presentation and hypernatremia   Covering for possible UTI and organisms in wound cx but probably colonization   Recommend completing Cefepime for a 7 day course on 06/09/19  On flagyl to cover aspiration . Stop on 06/07/19.  Follow final wound cx     Michael Lozano R Michael Rosch, DO  3:03 PM

## 2019-06-06 NOTE — Progress Notes (Addendum)
Patient does not have IV access. Attempted 5+ times and called IV team no response. Physician notified.

## 2019-06-06 NOTE — Progress Notes (Signed)
Bedside and Verbal shift change report given to Danielle (oncoming nurse) by Mo (offgoing nurse). Report included the following information SBAR, Kardex, MAR, Recent Results and Cardiac Rhythm NSR.
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
## 2019-06-06 NOTE — Progress Notes (Addendum)
Transition Plan of Care  RUR 20% Med  Covid positive  Will need PT/OT evaluation to determine level of care needed at discharge. Orders placed. At baseline he lives in a 2 story home and uses a cane to assist with ambulation.  Michael Lozano R Dermot Gremillion, RN CRM  Ext [phone]

## 2019-06-06 NOTE — Progress Notes (Signed)
[facility]                                                                                          Hospitalist Progress Note  Tammi Sou, MD  Answering service: [phone] OR [phone] from in house phone              Progress Note    Patient: Michael Lozano MRN: [id number]  SSN: [id number]    Date of Birth: 11/07/1934  Age: 85 y.o.  Sex: male      Admit Date: 06/02/2019    LOS: 4 days     Subjective:     Patient initially admitted to ICU due to renal failure, hyponatremia and hypotension.  Patient was transferred out of ICU 4/21 for hospitalist service to assume care.  Patient complaining of neuropathy in his hands and legs and does not think that he has ability to take care of himself at home.    Objective:     Vitals:    06/06/19 0500 06/06/19 0700 06/06/19 0732 06/06/19 1246   BP: (!) 98/50 92/75 (!) 104/58 (!) 94/59   Pulse: 62 70 71 100   Resp: 15 15 14 25    Temp:   98.3 ??F (36.8 ??C) 98.2 ??F (36.8 ??C)   SpO2: 100% 100% 100% 100%   Weight: 81.2 kg (179 lb)      Height:            Intake and Output:  Current Shift: No intake/output data recorded.  Last three shifts: 04/19 1901 - 04/21 0700  In: 4425 [P.O.:120; I.V.:4305]  Out: 3200 [Urine:3200]    Physical Exam:   GENERAL: alert, cooperative, no distress, appears stated age  THROAT & NECK: normal and no erythema or exudates noted.   LUNG: clear to auscultation bilaterally  HEART: regular rate and rhythm, S1, S2 normal, no murmur, click, rub or gallop  ABDOMEN: soft, non-tender. Bowel sounds normal. No masses,  no organomegaly  EXTREMITIES:  extremities normal, atraumatic, no cyanosis or edema  SKIN: no rash or abnormalities  NEUROLOGIC: AOx3.   PSYCHIATRIC: non focal    Lab/Data Review:  All lab results for the last 24 hours reviewed.     Recent Results (from the past 24 hour(s))   GLUCOSE, POC    Collection Time: 06/05/19  3:54 PM   Result Value Ref Range    Glucose (POC) 117 (H) 65 - 100 mg/dL    Performed by [name], POC    Collection Time: 06/05/19 11:04 PM   Result Value Ref Range    Glucose (POC) 154 (H) 65 - 100 mg/dL    Performed by BENOIT TODD    LACTIC ACID    Collection Time: 06/06/19  3:03 AM   Result Value Ref Range    Lactic acid 2.1 (HH) 0.4 - 2.0 MMOL/L   MAGNESIUM    Collection Time: 06/06/19  3:03 AM   Result Value Ref Range    Magnesium 2.3 1.6 - 2.4 mg/dL   PHOSPHORUS    Collection Time: 06/06/19  3:03  AM   Result Value Ref Range    Phosphorus 2.1 (L) 2.6 - 4.7 MG/DL   METABOLIC PANEL, BASIC    Collection Time: 06/06/19  3:03 AM   Result Value Ref Range    Sodium 143 136 - 145 mmol/L    Potassium 3.9 3.5 - 5.1 mmol/L    Chloride 114 (H) 97 - 108 mmol/L    CO2 22 21 - 32 mmol/L    Anion gap 7 5 - 15 mmol/L    Glucose 134 (H) 65 - 100 mg/dL    BUN 36 (H) 6 - 20 MG/DL    Creatinine 2.83 (H) 0.70 - 1.30 MG/DL    BUN/Creatinine ratio 16 12 - 20      GFR est AA 33 (L) >60 ml/min/1.20m2    GFR est non-AA 27 (L) >60 ml/min/1.77m2    Calcium 8.0 (L) 8.5 - 10.1 MG/DL   CBC W/O DIFF    Collection Time: 06/06/19  3:03 AM   Result Value Ref Range    WBC 6.9 4.1 - 11.1 K/uL    RBC 3.64 (L) 4.10 - 5.70 M/uL    HGB 10.2 (L) 12.1 - 17.0 g/dL    HCT 15.1 (L) 76.1 - 50.3 %    MCV 87.4 80.0 - 99.0 FL    MCH 28.0 26.0 - 34.0 PG    MCHC 32.1 30.0 - 36.5 g/dL    RDW 60.7 (H) 37.1 - 14.5 %    PLATELET 197 150 - 400 K/uL    MPV 12.2 8.9 - 12.9 FL    NRBC 0.0 0 PER 100 WBC    ABSOLUTE NRBC 0.00 0.00 - 0.01 K/uL   GLUCOSE, POC    Collection Time: 06/06/19  9:28 AM   Result Value Ref Range    Glucose (POC) 130 (H) 65 - 100 mg/dL    Performed by [place] Spaphay P    GLUCOSE, POC    Collection Time: 06/06/19 12:25 PM   Result Value Ref Range    Glucose (POC) 153 (H) 65 - 100 mg/dL    Performed by [place] Spaphay P         Imaging:    No results found.       Assessment and Plan:     AKI  -AKI on CKD stage III  -Improving serum creatinine on IV fluid  -Nephrology following  -Monitor intake and output     Hypernatremia  -Also sodium level is improving and now normalized  -Currently on D5W at 150 mL/h  -Nephrology following    Right lower extremity ulcers  -Wound culture with gram-positive cocci and light Enterobacter in light procidentia  -Seems chronic without cellulitis as per ID  -ID following, patient on cefepime and Flagyl    Urinary tract infection  -Urine culture with Pseudomonas and E. Coli  -Continue with current antibiotics    GI bleed  -Patient had coffee-ground emesis on admission  -GI evaluated the patient and recommending conservative management  -H&H stable, continue PPI    Covid pneumonia   -Chest x-ray with minimal right lower lobe infiltrate  -Patient is not a candidate for remdesivir or Decadron as patient not hypoxic  -Continue vitamin C and zinc  -On Flagyl to cover aspiration    Discharge disposition: Likely next 48 hours pending progress.    Signed By: Fredrik Cove, MD     June 06, 2019

## 2019-06-06 NOTE — Progress Notes (Signed)
NAME: Michael Lozano        DOB:  Jun 27, 1934        MRN:  [id number]         ??              Assessment   :                                               Plan:  AKI on CKD stage 3 (creatinine~ 1.7 at baseline?)  Rhabdomyolysis  Severe Hypernatremia  Covid  Hypotension  Metabolic acidosis  Hypercalcemia  UTI Creatinine showing improvement (peak 7.7mg /dl); Down to 2.3mg /dl. Non-oliguric;  prerenal +/- ATN    Na improved 148 to 143 .   Decrease D5W to 100cc/hr once PIV is established    ??  CK 1862 to 1372    ??COVID treatment->ID on board    Holding Ramipril and statin for now    Stable for transfer out of ICU    Discussed with ICU team       Subjective:     Chief Complaint:  Chart reviewed remotely (to limit spread of covid and conserve ppe). I spoke with RN. UOP 2.2L yesterday. Remains on RA.  Lost PIV at around noon. Borderline hypotensive    Review of Systems:    Symptom Y/N Comments  Symptom Y/N Comments   Fever/Chills    Chest Pain     Poor Appetite    Edema     Cough    Abdominal Pain     Sputum    Joint Pain     SOB/DOE    Pruritis/Rash     Nausea/vomit    Tolerating PT/OT     Diarrhea    Tolerating Diet     Constipation    Other       Could not obtain due to:      Objective:     VITALS:   Last 24hrs VS reviewed since prior progress note. Most recent are:  Visit Vitals  BP 107/61   Pulse 67   Temp 98.2 ??F (36.8 ??C)   Resp 13   Ht 5\' 10"  (1.778 m)   Wt 81.2 kg (179 lb)   SpO2 99%   BMI 25.68 kg/m??       Intake/Output Summary (Last 24 hours) at 06/06/2019 1641  Last data filed at 06/06/2019 0949  Gross per 24 hour   Intake 1300 ml   Output 1275 ml   Net 25 ml      Telemetry Reviewed:     PHYSICAL EXAM:  Deferred      Lab Data Reviewed: (see below)    Medications Reviewed: (see below)    PMH/SH reviewed - no change compared to H&P  ________________________________________________________________________  Care Plan discussed with:  Patient      Family      RN Y    Care Manager                    Consultant:          Comments   >50% of visit spent in counseling and coordination of care       ________________________________________________________________________  Everlene Cunning 06/08/2019, MD     Procedures: see electronic medical records for all procedures/Xrays and details which  were not copied into this note but were reviewed prior  to creation of Plan.      LABS:  Recent Labs     06/06/19  0303 06/05/19  0428   WBC 6.9 10.0   HGB 10.2* 9.3*   HCT 31.8* 28.6*   PLT 197 197     Recent Labs     06/06/19  0303 06/05/19  0428 06/04/19  1825   NA 143 148* 148*   K 3.9 3.7 3.6   CL 114* 117* 118*   CO2 22 23 24    BUN 36* 53* 63*   CREA 2.32* 3.15* 3.84*   GLU 134* 113* 122*   CA 8.0* 8.1* 7.8*   MG 2.3  --   --    PHOS 2.1*  --   --      Recent Labs     06/05/19  0428 06/04/19  1825 06/04/19  1247   AP 76 77 85   TP 6.9 6.8 7.0   ALB 2.3* 2.3* 2.3*   GLOB 4.6* 4.5* 4.7*     No results for input(s): INR, PTP, APTT, INREXT, INREXT in the last 72 hours.   No results for input(s): FE, TIBC, PSAT, FERR in the last 72 hours.   No results found for: FOL, RBCF   No results for input(s): PH, PCO2, PO2 in the last 72 hours.  No results for input(s): CPK, CKMB in the last 72 hours.    No lab exists for component: TROPONINI  No components found for: [facility]  Lab Results   Component Value Date/Time    Color DARK YELLOW 06/02/2019 01:00 PM    Appearance CLOUDY (A) 06/02/2019 01:00 PM    Specific gravity 1.023 06/02/2019 01:00 PM    pH (UA) 5.0 06/02/2019 01:00 PM    Protein Negative 06/02/2019 01:00 PM    Glucose Negative 06/02/2019 01:00 PM    Ketone TRACE (A) 06/02/2019 01:00 PM    Bilirubin NEGATIVE  08/09/2011 11:25 PM    Urobilinogen 0.2 06/02/2019 01:00 PM    Nitrites Positive (A) 06/02/2019 01:00 PM    Leukocyte Esterase SMALL (A) 06/02/2019 01:00 PM    Epithelial cells FEW 06/02/2019 01:00 PM    Bacteria Negative 06/02/2019 01:00 PM    WBC 0-4 06/02/2019 01:00 PM    RBC 0-5  06/02/2019 01:00 PM       MEDICATIONS:  Current Facility-Administered Medications   Medication Dose Route Frequency   ??? albumin human 25% (BUMINATE) solution 12.5 g  12.5 g IntraVENous Q8H PRN   ??? insulin lispro (HUMALOG) injection   SubCUTAneous AC&HS   ??? gabapentin (NEURONTIN) capsule 300 mg  300 mg Oral BID   ??? zinc sulfate (ZINCATE) 50 mg zinc (220 mg) capsule 1 Cap  1 Cap Oral DAILY   ??? ascorbic acid (vitamin C) (VITAMIN C) tablet 500 mg  500 mg Oral BID   ??? pantoprazole (PROTONIX) tablet 40 mg  40 mg Oral BID   ??? cholecalciferol (VITAMIN D3) (1000 Units /25 mcg) tablet 2,000 Units  2,000 Units Oral DAILY   ??? sucralfate (CARAFATE) tablet 1 g  1 g Oral AC&HS   ??? cefepime (MAXIPIME) 2 g in 0.9% sodium chloride (MBP/ADV) 100 mL MBP  2 g IntraVENous Q24H   ??? docusate sodium (COLACE) capsule 100 mg  100 mg Oral DAILY   ??? HYDROmorphone (PF) (DILAUDID) injection 0.5 mg  0.5 mg IntraVENous Q4H PRN   ??? dextrose 5% infusion  100 mL/hr IntraVENous CONTINUOUS   ??? albuterol (PROVENTIL HFA, VENTOLIN HFA, PROAIR  HFA) inhaler 1 Puff  1 Puff Inhalation Q4H PRN   ??? glucose chewable tablet 16 g  4 Tab Oral PRN   ??? dextrose (D50W) injection syrg 12.5-25 g  25-50 mL IntraVENous PRN   ??? glucagon (GLUCAGEN) injection 1 mg  1 mg IntraMUSCular PRN   ??? acetaminophen (TYLENOL) tablet 650 mg  650 mg Oral Q6H PRN    Or   ??? acetaminophen (TYLENOL) suppository 650 mg  650 mg Rectal Q6H PRN   ??? metroNIDAZOLE (FLAGYL) IVPB premix 500 mg  500 mg IntraVENous Q12H   ??? acetaminophen (TYLENOL) tablet 650 mg  650 mg Oral Q6H   ??? sodium chloride (NS) flush 5-40 mL  5-40 mL IntraVENous Q8H   ??? sodium chloride (NS) flush 5-40 mL  5-40 mL IntraVENous PRN   ??? polyethylene glycol (MIRALAX) packet 17 g  17 g Oral DAILY PRN   ??? promethazine (PHENERGAN) tablet 12.5 mg  12.5 mg Oral Q6H PRN    Or   ??? ondansetron (ZOFRAN) injection 4 mg  4 mg IntraVENous Q6H PRN

## 2019-06-06 NOTE — Progress Notes (Signed)
Patient does not have IV access. Attempted 5+ times and called IV team no response. Physician notified.

## 2019-06-06 NOTE — Progress Notes (Signed)
ID follow up note  Chart reviewed  Briefly discussed with Podiatry yesterday   Wound is chronic in R foot without surrounding cellulitis   Urine with Pseudomonas and ecoli  Suspect dehydration to play a large role in presentation and hypernatremia   Covering for possible UTI and organisms in wound cx but probably colonization   Recommend completing Cefepime for a 7 day course on 06/09/19  On flagyl to cover aspiration . Stop on 06/07/19.  Follow final wound cx     Kathlyne Loud R Lanissa Cashen, DO  3:03 PM

## 2019-06-06 NOTE — Progress Notes (Signed)
Progress  Notes by Talbert Forest, MD at 06/06/19 1641                Author: Talbert Forest, MD  Service: Nephrology  Author Type: Physician       Filed: 06/06/19 1646  Date of Service: 06/06/19 1641  Status: Signed          Editor: Talbert Forest, MD (Physician)                                                                                                 NAME: Michael Lozano         DOB:  Jun 22, 1934         MRN:  [id number]            ??               Assessment   :                                                Plan:      AKI on CKD stage 3 (creatinine~ 1.7 at baseline?)   Rhabdomyolysis   Severe Hypernatremia   Covid   Hypotension   Metabolic acidosis   Hypercalcemia   UTI  Creatinine showing improvement (peak 7.7mg /dl); Down to 2.3mg /dl. Non-oliguric;  prerenal +/- ATN      Na improved 148 to 143 .    Decrease D5W to 100cc/hr once PIV is established      ??   CK 1862 to 1372      ??COVID treatment->ID on board      Holding Ramipril and statin for now      Stable for transfer out of ICU      Discussed with ICU team             Subjective:        Chief Complaint:  Chart reviewed remotely (to limit spread of covid and conserve ppe). I spoke with RN. UOP 2.2L yesterday. Remains on RA.   Lost PIV at around noon. Borderline hypotensive      Review of Systems:              Symptom  Y/N  Comments    Symptom  Y/N  Comments             Fever/Chills        Chest Pain                 Poor Appetite        Edema                 Cough        Abdominal Pain         Sputum        Joint Pain         SOB/DOE        Pruritis/Rash         Nausea/vomit  Tolerating PT/OT         Diarrhea        Tolerating Diet                 Constipation        Other               Could not obtain due to:            Objective:        VITALS:    Last 24hrs VS reviewed since prior progress note. Most recent are:   Visit Vitals      BP  107/61     Pulse  67     Temp  98.2 ??F (36.8 ??C)     Resp  13     Ht  5\' 10"  (1.778 m)     Wt  81.2 kg (179  lb)     SpO2  99%        BMI  25.68 kg/m??           Intake/Output Summary (Last 24 hours) at 06/06/2019 1641   Last data filed at 06/06/2019 0949     Gross per 24 hour        Intake  1300 ml        Output  1275 ml        Net  25 ml         Telemetry Reviewed:       PHYSICAL EXAM:   Deferred         Lab Data Reviewed: (see below)      Medications Reviewed: (see below)      PMH/SH reviewed - no change compared to H&P   ________________________________________________________________________   Care Plan discussed with:       Patient             Family              RN  Y           Care Manager                            Consultant:                     Comments         >50% of visit spent in counseling and coordination of care            ________________________________________________________________________   Onix Jumper Sherrye Payor, MD       Procedures: see electronic medical records for all procedures/Xrays and details which   were not copied into this note but were reviewed prior to creation of Plan.        LABS:     Recent Labs            06/06/19   0303  06/05/19   0428     WBC  6.9  10.0     HGB  10.2*  9.3*     HCT  31.8*  28.6*         PLT  197  197          Recent Labs             06/06/19   0303  06/05/19   0428  06/04/19   1825     NA  143  148*  148*     K  3.9  3.7  3.6     CL  114*  117*  118*     CO2  22  23  24      BUN  36*  53*  63*     CREA  2.32*  3.15*  3.84*     GLU  134*  113*  122*     CA  8.0*  8.1*  7.8*     MG  2.3   --    --           PHOS  2.1*   --    --           Recent Labs             06/05/19   0428  06/04/19   1825  06/04/19   1247     AP  76  77  85     TP  6.9  6.8  7.0     ALB  2.3*  2.3*  2.3*          GLOB  4.6*  4.5*  4.7*        No results for input(s): INR, PTP, APTT, INREXT, INREXT in the last 72 hours.    No results for input(s): FE, TIBC, PSAT, FERR in the last 72 hours.    No results found for: FOL, RBCF    No results for input(s): PH, PCO2, PO2 in the last 72 hours.   No results for  input(s): CPK, CKMB in the last 72 hours.      No lab exists for component: TROPONINI   No components found for: [facility]     Lab Results         Component  Value  Date/Time            Color  DARK YELLOW  06/02/2019 01:00 PM       Appearance  CLOUDY (A)  06/02/2019 01:00 PM       Specific gravity  1.023  06/02/2019 01:00 PM       pH (UA)  5.0  06/02/2019 01:00 PM       Protein  Negative  06/02/2019 01:00 PM       Glucose  Negative  06/02/2019 01:00 PM       Ketone  TRACE (A)  06/02/2019 01:00 PM       Bilirubin  NEGATIVE   08/09/2011 11:25 PM       Urobilinogen  0.2  06/02/2019 01:00 PM       Nitrites  Positive (A)  06/02/2019 01:00 PM       Leukocyte Esterase  SMALL (A)  06/02/2019 01:00 PM       Epithelial cells  FEW  06/02/2019 01:00 PM       Bacteria  Negative  06/02/2019 01:00 PM       WBC  0-4  06/02/2019 01:00 PM            RBC  0-5  06/02/2019 01:00 PM           MEDICATIONS:     Current Facility-Administered Medications          Medication  Dose  Route  Frequency           ?  albumin human 25% (BUMINATE) solution 12.5 g   12.5 g  IntraVENous  Q8H PRN     ?  insulin lispro (HUMALOG) injection     SubCUTAneous  AC&HS     ?  gabapentin (NEURONTIN) capsule 300 mg   300 mg  Oral  BID     ?  zinc sulfate (ZINCATE) 50 mg zinc (220 mg) capsule 1 Cap   1 Cap  Oral  DAILY     ?  ascorbic acid (vitamin C) (VITAMIN C) tablet 500 mg   500 mg  Oral  BID     ?  pantoprazole (PROTONIX) tablet 40 mg   40 mg  Oral  BID     ?  cholecalciferol (VITAMIN D3) (1000 Units /25 mcg) tablet 2,000 Units   2,000 Units  Oral  DAILY     ?  sucralfate (CARAFATE) tablet 1 g   1 g  Oral  AC&HS     ?  cefepime (MAXIPIME) 2 g in 0.9% sodium chloride (MBP/ADV) 100 mL MBP   2 g  IntraVENous  Q24H     ?  docusate sodium (COLACE) capsule 100 mg   100 mg  Oral  DAILY     ?  HYDROmorphone (PF) (DILAUDID) injection 0.5 mg   0.5 mg  IntraVENous  Q4H PRN     ?  dextrose 5% infusion   100 mL/hr  IntraVENous  CONTINUOUS     ?  albuterol (PROVENTIL HFA,  VENTOLIN HFA, PROAIR HFA) inhaler 1 Puff   1 Puff  Inhalation  Q4H PRN     ?  glucose chewable tablet 16 g   4 Tab  Oral  PRN           ?  dextrose (D50W) injection syrg 12.5-25 g   25-50 mL  IntraVENous  PRN           ?  glucagon (GLUCAGEN) injection 1 mg   1 mg  IntraMUSCular  PRN     ?  acetaminophen (TYLENOL) tablet 650 mg   650 mg  Oral  Q6H PRN          Or           ?  acetaminophen (TYLENOL) suppository 650 mg   650 mg  Rectal  Q6H PRN     ?  metroNIDAZOLE (FLAGYL) IVPB premix 500 mg   500 mg  IntraVENous  Q12H     ?  acetaminophen (TYLENOL) tablet 650 mg   650 mg  Oral  Q6H     ?  sodium chloride (NS) flush 5-40 mL   5-40 mL  IntraVENous  Q8H     ?  sodium chloride (NS) flush 5-40 mL   5-40 mL  IntraVENous  PRN     ?  polyethylene glycol (MIRALAX) packet 17 g   17 g  Oral  DAILY PRN     ?  promethazine (PHENERGAN) tablet 12.5 mg   12.5 mg  Oral  Q6H PRN          Or           ?  ondansetron (ZOFRAN) injection 4 mg   4 mg  IntraVENous  Q6H PRN
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
## 2019-06-06 NOTE — Progress Notes (Signed)
Transition Plan of Care  RUR 20% Med  Covid positive  Will need PT/OT evaluation to determine level of care needed at discharge. Orders placed. At baseline he lives in a 2 story home and uses a cane to assist with ambulation.  Wilber Bihari, RN CRM  Ext [phone]

## 2019-06-06 NOTE — Progress Notes (Signed)
Bedside and Verbal shift change report given to Danielle (Cabin crew) by Tonie Griffith (offgoing nurse). Report included the following information SBAR, Kardex, MAR, Recent Results and Cardiac Rhythm NSR.

## 2019-06-06 NOTE — Progress Notes (Signed)
Bedside shift change report given to Mo, RN (oncoming nurse) by Lysle Dingwall, RN (offgoing nurse). Report included the following information SBAR, Kardex, ED Summary, OR Summary, Intake/Output, MAR, Accordion, Recent Results and Cardiac Rhythm V-Paced.       0272 - pt arrived to unit.  5366 - Critical lactic: 2.1 - NP notified. No orders.        TRANSFER - IN REPORT:    Verbal report received from Tawanna Cooler, Charity fundraiser (name) on Michael Lozano  being received from ICU (unit) for routine progression of care      Report consisted of patient's Situation, Background, Assessment and   Recommendations(SBAR).     Information from the following report(s) SBAR, Kardex, ED Summary, OR Summary, Intake/Output, MAR, Accordion, Recent Results and Cardiac Rhythm V Paced was reviewed with the receiving nurse.    Opportunity for questions and clarification was provided.      Assessment completed upon patient's arrival to unit and care assumed.

## 2019-06-06 NOTE — Progress Notes (Signed)
Progress  Notes by Fredrik Cove, MD at 06/06/19 1346                Author: Fredrik Cove, MD  Service: Hospitalist  Author Type: Physician       Filed: 06/06/19 1355  Date of Service: 06/06/19 1346  Status: Signed          Editor: Fredrik Cove, MD (Physician)                                    [facility]                                                                                              Hospitalist Progress Note   Fredrik Cove, MD   Answering service: [phone] OR [phone] from in house phone                     Progress Note          Patient: Michael Lozano  MRN: [id number]   SSN: [id number]          Date of Birth: 07/03/1934   Age: 85 y.o.   Sex: male         Admit Date: 06/02/2019     LOS: 4 days         Subjective:        Patient initially admitted to ICU due to renal failure, hyponatremia and hypotension.  Patient was transferred out of ICU 4/21 for hospitalist service to assume care.  Patient complaining of neuropathy in his hands and legs and does not think that he  has ability to take care of himself at home.        Objective:          Vitals:             06/06/19 0500  06/06/19 0700  06/06/19 0732  06/06/19 1246           BP:  (!) 98/50  92/75  (!) 104/58  (!) 94/59     Pulse:  62  70  71  100     Resp:  15  15  14  25      Temp:      98.3 ??F (36.8 ??C)  98.2 ??F (36.8 ??C)     SpO2:  100%  100%  100%  100%     Weight:  81.2 kg (179 lb)                 Height:                    Intake and Output:   Current Shift: No intake/output data recorded.   Last three shifts: 04/19 1901 - 04/21 0700   In: 4425 [P.O.:120; I.V.:4305]   Out: 3200 [Urine:3200]      Physical Exam:    GENERAL: alert, cooperative, no distress, appears stated age   THROAT & NECK: normal and  no erythema or exudates noted.    LUNG: clear to auscultation bilaterally   HEART: regular rate and rhythm, S1, S2 normal, no murmur, click, rub or gallop   ABDOMEN: soft, non-tender. Bowel sounds normal. No  masses,  no organomegaly   EXTREMITIES:  extremities normal, atraumatic, no cyanosis or edema   SKIN: no rash or abnormalities   NEUROLOGIC: AOx3.    PSYCHIATRIC: non focal      Lab/Data Review:   All lab results for the last 24 hours reviewed.         Recent Results (from the past 24 hour(s))     GLUCOSE, POC          Collection Time: 06/05/19  3:54 PM         Result  Value  Ref Range            Glucose (POC)  117 (H)  65 - 100 mg/dL       Performed by  Chauncey Fischer         GLUCOSE, POC          Collection Time: 06/05/19 11:04 PM         Result  Value  Ref Range            Glucose (POC)  154 (H)  65 - 100 mg/dL       Performed by  BENOIT TODD         LACTIC ACID          Collection Time: 06/06/19  3:03 AM         Result  Value  Ref Range            Lactic acid  2.1 (HH)  0.4 - 2.0 MMOL/L       MAGNESIUM          Collection Time: 06/06/19  3:03 AM         Result  Value  Ref Range            Magnesium  2.3  1.6 - 2.4 mg/dL       PHOSPHORUS          Collection Time: 06/06/19  3:03 AM         Result  Value  Ref Range            Phosphorus  2.1 (L)  2.6 - 4.7 MG/DL       METABOLIC PANEL, BASIC          Collection Time: 06/06/19  3:03 AM         Result  Value  Ref Range            Sodium  143  136 - 145 mmol/L       Potassium  3.9  3.5 - 5.1 mmol/L       Chloride  114 (H)  97 - 108 mmol/L       CO2  22  21 - 32 mmol/L       Anion gap  7  5 - 15 mmol/L       Glucose  134 (H)  65 - 100 mg/dL       BUN  36 (H)  6 - 20 MG/DL       Creatinine  8.18 (H)  0.70 - 1.30 MG/DL       BUN/Creatinine ratio  16  12 - 20         GFR est AA  33 (  L)  >60 ml/min/1.40m2       GFR est non-AA  27 (L)  >60 ml/min/1.76m2       Calcium  8.0 (L)  8.5 - 10.1 MG/DL       CBC W/O DIFF          Collection Time: 06/06/19  3:03 AM         Result  Value  Ref Range            WBC  6.9  4.1 - 11.1 K/uL       RBC  3.64 (L)  4.10 - 5.70 M/uL       HGB  10.2 (L)  12.1 - 17.0 g/dL       HCT  31.8 (L)  36.6 - 50.3 %       MCV  87.4  80.0 - 99.0 FL        MCH  28.0  26.0 - 34.0 PG       MCHC  32.1  30.0 - 36.5 g/dL       RDW  16.6 (H)  11.5 - 14.5 %       PLATELET  197  150 - 400 K/uL       MPV  12.2  8.9 - 12.9 FL       NRBC  0.0  0 PER 100 WBC       ABSOLUTE NRBC  0.00  0.00 - 0.01 K/uL       GLUCOSE, POC          Collection Time: 06/06/19  9:28 AM         Result  Value  Ref Range            Glucose (POC)  130 (H)  65 - 100 mg/dL       Performed by  Douangphoumy Spaphay P         GLUCOSE, POC          Collection Time: 06/06/19 12:25 PM         Result  Value  Ref Range            Glucose (POC)  153 (H)  65 - 100 mg/dL            Performed by  Douangphoumy Spaphay P              Imaging:      No results found.            Assessment and Plan:        AKI   -AKI on CKD stage III   -Improving serum creatinine on IV fluid   -Nephrology following   -Monitor intake and output      Hypernatremia   -Also sodium level is improving and now normalized   -Currently on D5W at 150 mL/h   -Nephrology following      Right lower extremity ulcers   -Wound culture with gram-positive cocci and light Enterobacter in light procidentia   -Seems chronic without cellulitis as per ID   -ID following, patient on cefepime and Flagyl      Urinary tract infection   -Urine culture with Pseudomonas and E. Coli   -Continue with current antibiotics      GI bleed   -Patient had coffee-ground emesis on admission   -GI evaluated the patient and recommending conservative management   -H&H stable, continue PPI      Covid pneumonia    -Chest x-ray  with minimal right lower lobe infiltrate   -Patient is not a candidate for remdesivir or Decadron as patient not hypoxic   -Continue vitamin C and zinc   -On Flagyl to cover aspiration      Discharge disposition: Likely next 48 hours pending progress.         Signed By:  Fredrik Cove, MD           June 06, 2019

## 2019-06-07 LAB — CBC WITH AUTOMATED DIFF
ABS. BASOPHILS: 0 10*3/uL (ref 0.0–0.1)
ABS. EOSINOPHILS: 0.2 10*3/uL (ref 0.0–0.4)
ABS. IMM. GRANS.: 0 10*3/uL (ref 0.00–0.04)
ABS. LYMPHOCYTES: 1 10*3/uL (ref 0.8–3.5)
ABS. MONOCYTES: 0.7 10*3/uL (ref 0.0–1.0)
ABS. NEUTROPHILS: 4.9 10*3/uL (ref 1.8–8.0)
ABSOLUTE NRBC: 0 10*3/uL (ref 0.00–0.01)
BASOPHILS: 0 % (ref 0–1)
EOSINOPHILS: 3 % (ref 0–7)
HCT: 31.5 % — ABNORMAL LOW (ref 36.6–50.3)
HGB: 10.1 g/dL — ABNORMAL LOW (ref 12.1–17.0)
IMMATURE GRANULOCYTES: 0 % (ref 0.0–0.5)
LYMPHOCYTES: 15 % (ref 12–49)
MCH: 28.2 PG (ref 26.0–34.0)
MCHC: 32.1 g/dL (ref 30.0–36.5)
MCV: 88 FL (ref 80.0–99.0)
MONOCYTES: 10 % (ref 5–13)
MPV: 12.3 FL (ref 8.9–12.9)
NEUTROPHILS: 72 % (ref 32–75)
NRBC: 0 PER 100 WBC
PLATELET: 205 10*3/uL (ref 150–400)
RBC: 3.58 M/uL — ABNORMAL LOW (ref 4.10–5.70)
RDW: 16.9 % — ABNORMAL HIGH (ref 11.5–14.5)
WBC: 6.9 10*3/uL (ref 4.1–11.1)

## 2019-06-07 LAB — GLUCOSE, POC
Glucose (POC): 168 mg/dL — ABNORMAL HIGH (ref 65–100)
Glucose (POC): 118 mg/dL — ABNORMAL HIGH (ref 65–100)
Glucose (POC): 183 mg/dL — ABNORMAL HIGH (ref 65–100)
Glucose (POC): 155 mg/dL — ABNORMAL HIGH (ref 65–100)
Glucose (POC): 96 mg/dL (ref 65–100)

## 2019-06-07 LAB — SAMPLES BEING HELD: SAMPLES BEING HELD: 1

## 2019-06-07 LAB — CULTURE, WOUND W GRAM STAIN

## 2019-06-07 LAB — METABOLIC PANEL, BASIC
Anion gap: 9 mmol/L (ref 5–15)
BUN/Creatinine ratio: 14 (ref 12–20)
BUN: 29 MG/DL — ABNORMAL HIGH (ref 6–20)
CO2: 20 mmol/L — ABNORMAL LOW (ref 21–32)
Calcium: 7.8 MG/DL — ABNORMAL LOW (ref 8.5–10.1)
Chloride: 115 mmol/L — ABNORMAL HIGH (ref 97–108)
Creatinine: 2.02 MG/DL — ABNORMAL HIGH (ref 0.70–1.30)
GFR est AA: 39 mL/min/{1.73_m2} — ABNORMAL LOW (ref >60)
GFR est non-AA: 32 mL/min/{1.73_m2} — ABNORMAL LOW (ref >60)
Glucose: 78 mg/dL (ref 65–100)
Potassium: 4.1 mmol/L (ref 3.5–5.1)
Sodium: 144 mmol/L (ref 136–145)

## 2019-06-07 LAB — PHOSPHORUS
Phosphorus: 1.3 MG/DL — ABNORMAL LOW (ref 2.6–4.7)
Phosphorus: 1.3 MG/DL — ABNORMAL LOW (ref 2.6–4.7)

## 2019-06-07 LAB — MAGNESIUM
Magnesium: 2.2 mg/dL (ref 1.6–2.4)
Magnesium: 2.2 mg/dL (ref 1.6–2.4)

## 2019-06-07 LAB — CULTURE, BLOOD, PAIRED
Culture result:: NO GROWTH
Culture: NO GROWTH

## 2019-06-07 LAB — LACTIC ACID
Lactic Acid: 2.4 MMOL/L (ref 0.4–2.0)
Lactic acid: 2.4 MMOL/L — CR (ref 0.4–2.0)

## 2019-06-07 LAB — CBC WITH AUTO DIFFERENTIAL
Basophils %: 0 % (ref 0–1)
Basophils Absolute: 0 10*3/uL (ref 0.0–0.1)
Eosinophils %: 3 % (ref 0–7)
Eosinophils Absolute: 0.2 10*3/uL (ref 0.0–0.4)
Granulocyte Absolute Count: 0 10*3/uL (ref 0.00–0.04)
Hematocrit: 31.5 % — ABNORMAL LOW (ref 36.6–50.3)
Hemoglobin: 10.1 g/dL — ABNORMAL LOW (ref 12.1–17.0)
Immature Granulocytes: 0 % (ref 0.0–0.5)
Lymphocytes %: 15 % (ref 12–49)
Lymphocytes Absolute: 1 10*3/uL (ref 0.8–3.5)
MCH: 28.2 PG (ref 26.0–34.0)
MCHC: 32.1 g/dL (ref 30.0–36.5)
MCV: 88 FL (ref 80.0–99.0)
MPV: 12.3 FL (ref 8.9–12.9)
Monocytes %: 10 % (ref 5–13)
Monocytes Absolute: 0.7 10*3/uL (ref 0.0–1.0)
NRBC Absolute: 0 10*3/uL (ref 0.00–0.01)
Neutrophils %: 72 % (ref 32–75)
Neutrophils Absolute: 4.9 10*3/uL (ref 1.8–8.0)
Nucleated RBCs: 0 PER 100 WBC
Platelets: 205 10*3/uL (ref 150–400)
RBC: 3.58 M/uL — ABNORMAL LOW (ref 4.10–5.70)
RDW: 16.9 % — ABNORMAL HIGH (ref 11.5–14.5)
WBC: 6.9 10*3/uL (ref 4.1–11.1)

## 2019-06-07 LAB — POCT GLUCOSE
POC Glucose: 168 mg/dL — ABNORMAL HIGH (ref 65–100)
POC Glucose: 118 mg/dL — ABNORMAL HIGH (ref 65–100)
POC Glucose: 183 mg/dL — ABNORMAL HIGH (ref 65–100)
POC Glucose: 155 mg/dL — ABNORMAL HIGH (ref 65–100)
POC Glucose: 96 mg/dL (ref 65–100)

## 2019-06-07 LAB — BASIC METABOLIC PANEL
Anion Gap: 9 mmol/L (ref 5–15)
BUN: 29 MG/DL — ABNORMAL HIGH (ref 6–20)
Bun/Cre Ratio: 14 (ref 12–20)
CO2: 20 mmol/L — ABNORMAL LOW (ref 21–32)
Calcium: 7.8 MG/DL — ABNORMAL LOW (ref 8.5–10.1)
Chloride: 115 mmol/L — ABNORMAL HIGH (ref 97–108)
Creatinine: 2.02 MG/DL — ABNORMAL HIGH (ref 0.70–1.30)
EGFR IF NonAfrican American: 32 mL/min/{1.73_m2} — ABNORMAL LOW (ref >60)
GFR African American: 39 mL/min/{1.73_m2} — ABNORMAL LOW (ref >60)
Glucose: 78 mg/dL (ref 65–100)
Potassium: 4.1 mmol/L (ref 3.5–5.1)
Sodium: 144 mmol/L (ref 136–145)

## 2019-06-07 MED ORDER — POTASSIUM & SODIUM PHOSPHATES 280 MG-160 MG-250 MG ORAL POWDER PACKET
280-160-250 mg | Freq: Two times a day (BID) | ORAL | Status: DC
Start: 2019-06-07 — End: 2019-06-08
  Administered 2019-06-07 – 2019-06-08 (×2): via ORAL

## 2019-06-07 MED ORDER — SODIUM CHLORIDE 0.9 % IV
3 mmol/mL | Freq: Once | INTRAVENOUS | Status: AC
Start: 2019-06-07 — End: 2019-06-07
  Administered 2019-06-07: 17:00:00 via INTRAVENOUS

## 2019-06-07 MED ORDER — METRONIDAZOLE 250 MG TAB
250 mg | Freq: Two times a day (BID) | ORAL | Status: DC
Start: 2019-06-07 — End: 2019-06-07
  Administered 2019-06-07 (×2): via ORAL

## 2019-06-07 MED FILL — ACETAMINOPHEN 325 MG TABLET: 325 mg | ORAL | Qty: 2

## 2019-06-07 MED FILL — ZINC-220 50 MG ZINC (220 MG) CAPSULE: 50 mg zinc (220 mg) | ORAL | Qty: 1

## 2019-06-07 MED FILL — PANTOPRAZOLE 40 MG TAB, DELAYED RELEASE: 40 mg | ORAL | Qty: 1

## 2019-06-07 MED FILL — SUCRALFATE 1 GRAM TAB: 1 gram | ORAL | Qty: 1

## 2019-06-07 MED FILL — GABAPENTIN 300 MG CAP: 300 mg | ORAL | Qty: 1

## 2019-06-07 MED FILL — PHOS-NAK 280 MG-160 MG-250 MG ORAL POWDER PACKET: 280-160-250 mg | ORAL | Qty: 2

## 2019-06-07 MED FILL — DOK 100 MG CAPSULE: 100 mg | ORAL | Qty: 1

## 2019-06-07 MED FILL — INSULIN LISPRO 100 UNIT/ML INJECTION: 100 unit/mL | SUBCUTANEOUS | Qty: 1

## 2019-06-07 MED FILL — PHOS-NAK 280 MG-160 MG-250 MG ORAL POWDER PACKET: 280-160-250 mg | ORAL | Qty: 1

## 2019-06-07 MED FILL — METRONIDAZOLE 250 MG TAB: 250 mg | ORAL | Qty: 2

## 2019-06-07 MED FILL — VITAMIN C 500 MG TABLET: 500 mg | ORAL | Qty: 1

## 2019-06-07 MED FILL — SODIUM PHOSPHATE 3 MMOLE/ML IV: 3 mmol/mL | INTRAVENOUS | Qty: 10

## 2019-06-07 MED FILL — CEFEPIME 2 GRAM SOLUTION FOR INJECTION: 2 gram | INTRAMUSCULAR | Qty: 2

## 2019-06-07 MED FILL — CHOLECALCIFEROL (VITAMIN D3) 1,000 UNIT (25 MCG) TAB: ORAL | Qty: 2

## 2019-06-07 NOTE — Progress Notes (Signed)
Bedside and Verbal shift change report given to Rob (oncoming nurse) by Mo (offgoing nurse). Report included the following information SBAR, Kardex, MAR, Recent Results and Cardiac Rhythm NSR.

## 2019-06-07 NOTE — Progress Notes (Signed)
Physician Progress Note      PATIENT:               Michael Lozano, Michael Lozano  CSN #:                  [id number]  DOB:                       05/19/1934  ADMIT DATE:       06/02/2019 12:08 PM  DISCH DATE:  RESPONDING  PROVIDER #:        Tammy Wickliffe W Asta Corbridge MD          QUERY TEXT:    Patient admitted with AKI. Patient with recent Covid infection and noted positive Covid test on 4/17.  If possible, please document in progress notes and discharge summary if patient has an active Covid-19 infection or if patient is testing positive for Covid-19 without a current active infection:    The medical record reflects the following:  Risk Factors: 84 y/o male admitted with AKI, hx of recent COVID infection, PM,  DM2,  Clinical Indicators: poor p.o. intake, somnolent and less interactive, hypernatremic and hypotensive, coffee ground emesis, per lab result 4/17 "Sars detected" per CXR Minimal right lower lobe infiltrate is slightly increased in the interval  Treatment: Cefepime, IVF bolus 2.5L, Rocephin, Decadron, Zinc, Vanco  Options provided:  -- Active Covid-19 infection is being treated and/or evaluated on current encounter  -- Positive Covid test result without an active current Covid-19 infection  -- Other - I will add my own diagnosis  -- Disagree - Not applicable / Not valid  -- Disagree - Clinically unable to determine / Unknown  -- Refer to Clinical Documentation Reviewer    PROVIDER RESPONSE TEXT:    Patient is testing positive for Covid without an active Covid-19 infection.    Query created by: Fowler, Joyce on 06/05/2019 12:13 PM      Electronically signed by:  Cassie Henkels W Noni Stonesifer MD 06/07/2019 12:19 PM

## 2019-06-07 NOTE — Progress Notes (Signed)
Infectious Disease Progress Note       Subjective:     Michael Lozano seen  Complains of nausea              Objective:    Vitals:   Patient Vitals for the past 24 hrs:   Temp Pulse Resp BP SpO2   06/07/19 0651 97.9 ??F (36.6 ??C) 62 26 (!) 103/53 ???   06/07/19 0415 98 ??F (36.7 ??C) 64 13 (!) 102/57 94 %   06/07/19 0145 98.4 ??F (36.9 ??C) 70 16 96/65 95 %   06/06/19 2035 ??? ??? ??? (!) 101/59 ???   06/06/19 2033 ??? 70 22 (!) 101/59 93 %   06/06/19 1922 97.4 ??F (36.3 ??C) 81 16 (!) 100/55 95 %   06/06/19 1543 98.2 ??F (36.8 ??C) 67 13 107/61 99 %   06/06/19 1246 98.2 ??F (36.8 ??C) 100 25 (!) 94/59 100 %       Physical Exam:  Gen: No apparent distress  HEENT:   No scleral icterus, no thrush   CV: S1,2 heard regularly on monitor  Lungs: non labored breathing   Abdomen: soft, non tender,   Skin: no rash on face, extremities   Musculoskeletal:  + R plantar wound, no surrounding erythema    Medications:    Current Facility-Administered Medications:   ???  sodium phosphate 30 mmol in 0.9% sodium chloride 250 mL infusion, , IntraVENous, ONCE, Pervis Hocking, MD  ???  potassium, sodium phosphates (NEUTRA-PHOS) packet 2 Packet, 2 Packet, Oral, BID, Patel, Deep V, MD  ???  albumin human 25% (BUMINATE) solution 12.5 g, 12.5 g, IntraVENous, Q8H PRN, Posey Pronto, Deep V, MD  ???  insulin lispro (HUMALOG) injection, , SubCUTAneous, AC&HS, Meyer Russel, NP-C, Stopped at 06/06/19 2200  ???  gabapentin (NEURONTIN) capsule 300 mg, 300 mg, Oral, BID, Theodore Demark R, NP-C, 300 mg at 06/07/19 1610  ???  zinc sulfate (ZINCATE) 50 mg zinc (220 mg) capsule 1 Cap, 1 Cap, Oral, DAILY, Theodore Demark R, NP-C, 1 Cap at 06/07/19 9604  ???  ascorbic acid (vitamin C) (VITAMIN C) tablet 500 mg, 500 mg, Oral, BID, Theodore Demark R, NP-C, 500 mg at 06/07/19 5409  ???  pantoprazole (PROTONIX) tablet 40 mg, 40 mg, Oral, BID, Theodore Demark R, NP-C, 40 mg at 06/07/19 8119  ???  cholecalciferol (VITAMIN D3) (1000 Units /25 mcg) tablet 2,000 Units, 2,000 Units, Oral, DAILY, Theodore Demark R, NP-C, 2,000 Units at 06/07/19 1478  ???  sucralfate (CARAFATE) tablet 1 g, 1 g, Oral, AC&HS, Theodore Demark R, NP-C, 1 g at 06/07/19 2956  ???  cefepime (MAXIPIME) 2 g in 0.9% sodium chloride (MBP/ADV) 100 mL MBP, 2 g, IntraVENous, Q24H, Iziah Cates R, DO, Last Rate: 200 mL/hr at 06/05/19 1549, 2 g at 06/05/19 1549  ???  docusate sodium (COLACE) capsule 100 mg, 100 mg, Oral, DAILY, Theodore Demark R, NP-C, 100 mg at 06/07/19 2130  ???  HYDROmorphone (PF) (DILAUDID) injection 0.5 mg, 0.5 mg, IntraVENous, Q4H PRN, Arta Silence, NP, 0.5 mg at 06/05/19 0835  ???  dextrose 5% infusion, 100 mL/hr, IntraVENous, CONTINUOUS, Patel, Deep V, MD, Last Rate: 100 mL/hr at 06/07/19 0020, 100 mL/hr at 06/07/19 0020  ???  albuterol (PROVENTIL HFA, VENTOLIN HFA, PROAIR HFA) inhaler 1 Puff, 1 Puff, Inhalation, Q4H PRN, Erline Levine, NP  ???  glucose chewable tablet 16 g, 4 Tab, Oral, PRN, Erline Levine, NP  ???  dextrose (D50W) injection syrg 12.5-25 g,  25-50 mL, IntraVENous, PRN, Tonette Bihari, NP  ???  glucagon (GLUCAGEN) injection 1 mg, 1 mg, IntraMUSCular, PRN, Tonette Bihari, NP  ???  acetaminophen (TYLENOL) tablet 650 mg, 650 mg, Oral, Q6H PRN **OR** acetaminophen (TYLENOL) suppository 650 mg, 650 mg, Rectal, Q6H PRN, Bevelyn Buckles, Kasandra Knudsen, NP  ???  acetaminophen (TYLENOL) tablet 650 mg, 650 mg, Oral, Q6H, Sinnatamby, Diane S, MD, 650 mg at 06/07/19 6606  ???  sodium chloride (NS) flush 5-40 mL, 5-40 mL, IntraVENous, Q8H, Lockard, Sara H, NP, 10 mL at 06/07/19 3016  ???  sodium chloride (NS) flush 5-40 mL, 5-40 mL, IntraVENous, PRN, Bevelyn Buckles, Kasandra Knudsen, NP  ???  polyethylene glycol (MIRALAX) packet 17 g, 17 g, Oral, DAILY PRN, Bevelyn Buckles, Kasandra Knudsen, NP  ???  promethazine (PHENERGAN) tablet 12.5 mg, 12.5 mg, Oral, Q6H PRN **OR** ondansetron (ZOFRAN) injection 4 mg, 4 mg, IntraVENous, Q6H PRN, Nila Nephew, NP, 4 mg at 06/05/19 1320      Labs:  Recent Results (from the past 24 hour(s))   GLUCOSE, POC    Collection Time: 06/06/19  5:14 PM   Result Value  Ref Range    Glucose (POC) 172 (H) 65 - 100 mg/dL    Performed by Saunders Revel    GLUCOSE, POC    Collection Time: 06/06/19  8:38 PM   Result Value Ref Range    Glucose (POC) 168 (H) 65 - 100 mg/dL    Performed by Ezra Sites    LACTIC ACID    Collection Time: 06/07/19  1:10 AM   Result Value Ref Range    Lactic acid 2.4 (HH) 0.4 - 2.0 MMOL/L   MAGNESIUM    Collection Time: 06/07/19  1:10 AM   Result Value Ref Range    Magnesium 2.2 1.6 - 2.4 mg/dL   PHOSPHORUS    Collection Time: 06/07/19  1:10 AM   Result Value Ref Range    Phosphorus 1.3 (L) 2.6 - 4.7 MG/DL   METABOLIC PANEL, BASIC    Collection Time: 06/07/19  1:10 AM   Result Value Ref Range    Sodium 144 136 - 145 mmol/L    Potassium 4.1 3.5 - 5.1 mmol/L    Chloride 115 (H) 97 - 108 mmol/L    CO2 20 (L) 21 - 32 mmol/L    Anion gap 9 5 - 15 mmol/L    Glucose 78 65 - 100 mg/dL    BUN 29 (H) 6 - 20 MG/DL    Creatinine 0.10 (H) 0.70 - 1.30 MG/DL    BUN/Creatinine ratio 14 12 - 20      GFR est AA 39 (L) >60 ml/min/1.1m2    GFR est non-AA 32 (L) >60 ml/min/1.54m2    Calcium 7.8 (L) 8.5 - 10.1 MG/DL   CBC WITH AUTOMATED DIFF    Collection Time: 06/07/19  1:10 AM   Result Value Ref Range    WBC 6.9 4.1 - 11.1 K/uL    RBC 3.58 (L) 4.10 - 5.70 M/uL    HGB 10.1 (L) 12.1 - 17.0 g/dL    HCT 93.2 (L) 35.5 - 50.3 %    MCV 88.0 80.0 - 99.0 FL    MCH 28.2 26.0 - 34.0 PG    MCHC 32.1 30.0 - 36.5 g/dL    RDW 73.2 (H) 20.2 - 14.5 %    PLATELET 205 150 - 400 K/uL    MPV 12.3 8.9 - 12.9 FL    NRBC 0.0 0 PER 100  WBC    ABSOLUTE NRBC 0.00 0.00 - 0.01 K/uL    NEUTROPHILS 72 32 - 75 %    LYMPHOCYTES 15 12 - 49 %    MONOCYTES 10 5 - 13 %    EOSINOPHILS 3 0 - 7 %    BASOPHILS 0 0 - 1 %    IMMATURE GRANULOCYTES 0 0.0 - 0.5 %    ABS. NEUTROPHILS 4.9 1.8 - 8.0 K/UL    ABS. LYMPHOCYTES 1.0 0.8 - 3.5 K/UL    ABS. MONOCYTES 0.7 0.0 - 1.0 K/UL    ABS. EOSINOPHILS 0.2 0.0 - 0.4 K/UL    ABS. BASOPHILS 0.0 0.0 - 0.1 K/UL    ABS. IMM. GRANS. 0.0 0.00 - 0.04 K/UL    DF AUTOMATED      SAMPLES BEING HELD    Collection Time: 06/07/19  1:10 AM   Result Value Ref Range    SAMPLES BEING HELD 1BLUE,1LAV,1GOLD,1RED     COMMENT        Add-on orders for these samples will be processed based on acceptable specimen integrity and analyte stability, which may vary by analyte.   SAMPLES BEING HELD    Collection Time: 06/07/19  1:10 AM   Result Value Ref Range    SAMPLES BEING HELD  1 BLUE     COMMENT        Add-on orders for these samples will be processed based on acceptable specimen integrity and analyte stability, which may vary by analyte.   GLUCOSE, POC    Collection Time: 06/07/19  9:15 AM   Result Value Ref Range    Glucose (POC) 118 (H) 65 - 100 mg/dL    Performed by Fabio Pierce    GLUCOSE, POC    Collection Time: 06/07/19 12:19 PM   Result Value Ref Range    Glucose (POC) 183 (H) 65 - 100 mg/dL    Performed by Dorna Bloom            Micro:     Blood: 06/02/19  Special Requests: NO SPECIAL REQUESTS  ?? Preliminary   Culture result: NO GROWTH 2 DAYS              Urine:   Specimen Information: Cath Urine   ??    Component Value Ref Range & Units Status   Special Requests: NO SPECIAL REQUESTS  ?? Preliminary   Colony Count >100,000   COLONIES/mL  ?? Preliminary   Culture result: GRAM NEGATIVE RODSAbnormal           05/17/19 Wound   Foot; Wound    ??    Component Value Ref Range & Units Status   Special Requests: NO SPECIAL REQUESTS  ?? Final   GRAM STAIN NO WBC'S SEEN  ?? Final   GRAM STAIN FEW GRAM POSITIVE COCCI  ?? Final   GRAM STAIN FEW GRAM POSITIVE RODS  ?? Final   Culture result: Abnormal   ?? Final   LIGHT ENTEROBACTER CLOACAE COMPLEX    Culture result: LIGHT PROVIDENCIA RETTGERIAbnormal   ?? Final   Culture result:  ?? Final   LIGHT MIXED SKIN FLORA ISOLATED    Susceptibility     Enterobacter cloacae complex Providencia rettgeri     MIC MIC    Amikacin ($) <=2 ug/mL S <=2 ug/mL S    Ampicillin ($)   >=32 ug/mL R    Ampicillin/sulbactam ($)   >=32 ug/mL R    Cefazolin ($) >=64 ug/mL R >=64 ug/mL  R    Cefepime  ($$) <=1 ug/mL S <=1 ug/mL S    Cefoxitin >=64 ug/mL R <=4 ug/mL S    Ceftazidime ($) <=1 ug/mL S <=1 ug/mL S    Ceftriaxone ($) <=1 ug/mL S <=1 ug/mL S    Ciprofloxacin ($) <=0.25 ug/mL S <=0.25 ug/mL S    Gentamicin ($) <=1 ug/mL S <=1 ug/mL S    Levofloxacin ($) <=0.12 ug/mL S <=0.12 ug/mL S    Meropenem ($$) <=0.25 ug/mL S <=0.25 ug/mL S    Piperacillin/Tazobac ($) <=4 ug/mL S <=4 ug/mL S    Tobramycin ($) 8 ug/mL I <=1 ug/mL S    Trimeth/Sulfa >=320 ug/mL R              Result History          Pathology:  08/12/2011  FINAL PATHOLOGIC DIAGNOSIS  1. Bone, left fifth metatarsal head, biopsy:  Acute osteomyelitis  2. Bone, left fifth metatarsal head, biopsy:  Acute osteomyelitis      Imaging:  CT RLE  ??  FINDINGS: The evaluation for infection is partially limited secondary to the  lack of IV contrast. There is an ulcer plantar to the second MTP joint measuring  17 x 20 mm. There is no evidence of a drainable fluid collection. There is no  subcutaneous emphysema.  ??  The bones are mildly osteopenic. There is dorsal subluxation at the first MTP  joint. There are extensive erosive changes involving the first MTP joint that  have a chronic appearance. The patient is status post fifth MTP arthrotomy. No  acute fracture or dislocation. No definite evidence of acute osteomyelitis.  ??  IMPRESSION  ??  1. Ulcer plantar to the second MTP joint. No evidence of a focal drainable fluid  collection.  2. Dorsal subluxation at the first MTP joint with erosive changes that appear  chronic. These findings may be related to inflammatory arthropathy, gout, or  prior septic arthritis. No definite evidence of acute osteomyelitis.    CT A/P  FINDINGS:   LOWER THORAX: Atelectasis at the lung bases  LIVER: Probable cyst left hepatic lobe  BILIARY TREE: Stones in a nondistended gallbladder. CBD is not dilated.  SPLEEN: within normal limits.  PANCREAS: No focal abnormality.  ADRENALS: Unremarkable.  KIDNEYS/URETERS: Probable cyst left kidney  but no hydronephrosis  STOMACH: Unremarkable.  SMALL BOWEL: No dilatation or wall thickening.  COLON: Diverticulosis of the left colon. No evidence of acute diverticulitis  APPENDIX: Surgically absent  PERITONEUM: No ascites or pneumoperitoneum.  RETROPERITONEUM: No lymphadenopathy or aortic aneurysm. There are vascular  calcifications  REPRODUCTIVE ORGANS: Not enlarged  URINARY BLADDER: Decompressed by Foley  BONES: No destructive bone lesion.  ABDOMINAL WALL: Umbilical hernia containing fat  ADDITIONAL COMMENTS: N/A  ??  IMPRESSION  ??  1. No hydronephrosis  2. Diverticulosis. No evidence of acute diverticulitis  3. Gallstones. No evidence of acute cholecystitis    CT head  ??  FINDINGS:  Intervals and cisterns are mildly enlarged compatible with the overall degree of  volume loss. Encephalomalacia right posterior frontal lobe and parietal lobe.  Mild low density within the periventricular white matter. There is no  intracranial hemorrhage, extra-axial collection, or mass effect. The basilar  cisterns are open. No CT evidence of acute infarct.  ??  The bone windows demonstrate no abnormalities. Minimal sinus mucosal  inflammatory change.  ??  IMPRESSION    Encephalomalacia posterior left frontal and parietal lobe. No acute intracranial  Abnormality  06/02/19  Indication: Coffee-ground emesis, leukocytosis  ??  Comparison to earlier the same day. Portable exam obtained at 1942 demonstrates  minimal right lower lobe infiltrate, slightly increased compared to the prior  exam.  ??  IMPRESSION  Minimal right lower lobe infiltrate is slightly increased in the  interval.  ??    06/02/19  FINDINGS: Three views of the right foot demonstrate that is post surgical  correction of the distal fifth digit. Hyperextension at the first MTP joint with  erosive change.. The soft tissues are within normal limits.  ??  IMPRESSION  ??  No definite plain film evidence for ostomy myelitis..    Assessment / Plan    Michael Lozano is a 85 year old  gentleman with a history of diabetes, left diabetic foot infection, osteomyelitis status post left fifth metatarsal head resection in 2013, chronic right plantar wound diagnosed, indwelling cardiac device/pacemaker with SARS-CoV-2 on 05/18/2019. He was seen in the emergency room on 4/2 and 05/28/2019.  He presented with fever generalized body aches on 05/18/2019 and had a positive rapid SARS-CoV-2.  Appears he was discharged home and went back to the ER on 05/28/2019 with a chief complaint of fatigue and not eating or drinking.  He was brought to the emergency room by his grandson per the ER note with decline and very poor p.o. intake.  There was also concerns for periods of disorientation and confusion.  Initially found to be hyponatremic with a sodium of 163 and a creatinine of 6.6 and a BUN of 130.  Labs also showed elevated CK, leukocytosis in the range of 12-16, anemia, elevated inflammatory markers.  His urine on 06/02/2019 had 0-4 white cells and negative for bacteria.  Blood culture sent during this admission is negative.  Urine culture shows greater than 100,000 gram-negative rods.  Wound culture report in his foot wound from 05/17/19 resulted with Enterobacter cloacae and Providencia rettgeri, light mixed skin flora.  He has had imaging with an x-ray as well as a CT scan of the right lower extremity and reported as no evidence of focal drainable fluid collection and no evidence of acute osteomyelitis.  He was started on antibiotics which included vancomycin cefepime and Flagyl.      1) chronic right lower extremity plantar wound, DM foot wound, Wound cx  05/17/19 + for Enterobacter and Providentia  2)severe hypernatremia  3) AKI  4) COVID + 05/17/19 , 06/02/19, on room air   5)Concern for GI bleed and risk for aspiration   6) DM  7) Urine cx wt Pseudomonas, Ecoli        In regards to the right plantar wound, appears chronic without any cellulitis  Wound cultures from 05/17/2019 + for Enterobacter , Providentia for which  he is on Cefepime.  Cefepime can cause akinetic seizures and altered mental status in the elderly and continue to monitor.   tolerating well so far except for nausea  Antiemetics per primary team   Stop cefepime 06/09/19  Stop flagyl today  Needs good wound care  Supportive care for SARS-CoV-2  Will sign off            Thank you for the opportunity to participate in the care of this patient.   Please contact with questions or concerns.      Arlyce Harman, DO   12:38 PM

## 2019-06-07 NOTE — Progress Notes (Addendum)
[facility]                                                                                          Hospitalist Progress Note  Pervis Hocking, MD  Answering service: [phone] OR [phone] from in house phone              Progress Note    Patient: Michael Lozano MRN: [id number]  SSN: [id number]    Date of Birth: Jul 07, 1934  Age: 85 y.o.  Sex: male      Admit Date: 06/02/2019    LOS: 5 days     Subjective:     Patient initially admitted to ICU due to renal failure, hyponatremia and hypotension.  Patient was transferred out of ICU 4/21 for hospitalist service to assume care.  Patient complaining of neuropathy in his hands and legs and does not think that he has ability to take care of himself at home.    No acute overnight events. No pain. NAD    Objective:     Vitals:    06/07/19 0145 06/07/19 0415 06/07/19 0651 06/07/19 0919   BP: 96/65 (!) 102/57 (!) 103/53    Pulse: 70 64 62    Resp: 16 13 26     Temp: 98.4 ??F (36.9 ??C) 98 ??F (36.7 ??C) 97.9 ??F (36.6 ??C)    SpO2: 95% 94%     Weight:    81.2 kg (179 lb)   Height:            Intake and Output:  Current Shift: No intake/output data recorded.  Last three shifts: 04/20 1901 - 04/22 0700  In: 1240 [P.O.:390; I.V.:850]  Out: 2235 [Urine:2235]    Physical Exam:   GENERAL: alert, cooperative, no distress  LUNG: clear to auscultation bilaterally  HEART: regular rate and rhythm  ABDOMEN: soft, non-tender  EXTREMITIES:  extremities normal, atraumatic, no cyanosis or edema  SKIN: no rash or abnormalities  NEUROLOGIC: AOx3  PSYCHIATRIC: non focal    Lab/Data Review:  All lab results for the last 24 hours reviewed.     Recent Results (from the past 24 hour(s))   GLUCOSE, POC    Collection Time: 06/06/19 12:25 PM   Result Value Ref Range    Glucose (POC) 153 (H) 65 - 100 mg/dL    Performed by Douangphoumy Spaphay P    GLUCOSE, POC    Collection Time: 06/06/19  5:14 PM   Result Value Ref Range    Glucose (POC) 172 (H) 65 - 100 mg/dL     Performed by [place], POC    Collection Time: 06/06/19  8:38 PM   Result Value Ref Range    Glucose (POC) 168 (H) 65 - 100 mg/dL    Performed by Noah Charon    LACTIC ACID    Collection Time: 06/07/19  1:10 AM   Result Value Ref Range    Lactic acid 2.4 (HH) 0.4 - 2.0 MMOL/L   MAGNESIUM    Collection Time: 06/07/19  1:10 AM   Result Value Ref Range  Magnesium 2.2 1.6 - 2.4 mg/dL   PHOSPHORUS    Collection Time: 06/07/19  1:10 AM   Result Value Ref Range    Phosphorus 1.3 (L) 2.6 - 4.7 MG/DL   METABOLIC PANEL, BASIC    Collection Time: 06/07/19  1:10 AM   Result Value Ref Range    Sodium 144 136 - 145 mmol/L    Potassium 4.1 3.5 - 5.1 mmol/L    Chloride 115 (H) 97 - 108 mmol/L    CO2 20 (L) 21 - 32 mmol/L    Anion gap 9 5 - 15 mmol/L    Glucose 78 65 - 100 mg/dL    BUN 29 (H) 6 - 20 MG/DL    Creatinine 6.70 (H) 0.70 - 1.30 MG/DL    BUN/Creatinine ratio 14 12 - 20      GFR est AA 39 (L) >60 ml/min/1.86m2    GFR est non-AA 32 (L) >60 ml/min/1.55m2    Calcium 7.8 (L) 8.5 - 10.1 MG/DL   CBC WITH AUTOMATED DIFF    Collection Time: 06/07/19  1:10 AM   Result Value Ref Range    WBC 6.9 4.1 - 11.1 K/uL    RBC 3.58 (L) 4.10 - 5.70 M/uL    HGB 10.1 (L) 12.1 - 17.0 g/dL    HCT 14.1 (L) 03.0 - 50.3 %    MCV 88.0 80.0 - 99.0 FL    MCH 28.2 26.0 - 34.0 PG    MCHC 32.1 30.0 - 36.5 g/dL    RDW 13.1 (H) 43.8 - 14.5 %    PLATELET 205 150 - 400 K/uL    MPV 12.3 8.9 - 12.9 FL    NRBC 0.0 0 PER 100 WBC    ABSOLUTE NRBC 0.00 0.00 - 0.01 K/uL    NEUTROPHILS 72 32 - 75 %    LYMPHOCYTES 15 12 - 49 %    MONOCYTES 10 5 - 13 %    EOSINOPHILS 3 0 - 7 %    BASOPHILS 0 0 - 1 %    IMMATURE GRANULOCYTES 0 0.0 - 0.5 %    ABS. NEUTROPHILS 4.9 1.8 - 8.0 K/UL    ABS. LYMPHOCYTES 1.0 0.8 - 3.5 K/UL    ABS. MONOCYTES 0.7 0.0 - 1.0 K/UL    ABS. EOSINOPHILS 0.2 0.0 - 0.4 K/UL    ABS. BASOPHILS 0.0 0.0 - 0.1 K/UL    ABS. IMM. GRANS. 0.0 0.00 - 0.04 K/UL    DF AUTOMATED     SAMPLES BEING HELD    Collection Time: 06/07/19  1:10  AM   Result Value Ref Range    SAMPLES BEING HELD 1BLUE,1LAV,1GOLD,1RED     COMMENT        Add-on orders for these samples will be processed based on acceptable specimen integrity and analyte stability, which may vary by analyte.   SAMPLES BEING HELD    Collection Time: 06/07/19  1:10 AM   Result Value Ref Range    SAMPLES BEING HELD  1 BLUE     COMMENT        Add-on orders for these samples will be processed based on acceptable specimen integrity and analyte stability, which may vary by analyte.   GLUCOSE, POC    Collection Time: 06/07/19  9:15 AM   Result Value Ref Range    Glucose (POC) 118 (H) 65 - 100 mg/dL    Performed by Julieanne Manson         Imaging:  No  results found.     Assessment and Plan:     AKI on CKD stage III  -Improving serum creatinine on IV fluid  -Nephrology following  -Monitor intake and output    Hypernatremia improving  -Currently on D5W  -Nephrology following    Right lower extremity ulcers  Appreciate ID:  Wound is chronic in R foot without surrounding cellulitis   Urine with Pseudomonas and ecoli  Suspect dehydration to play a large role in presentation and hypernatremia   Covering for possible UTI and organisms in wound cx but probably colonization   Recommend completing Cefepime for a 7 day course on 06/09/19  On flagyl to cover aspiration . Stop on 06/07/19.  wound cx with light pseudomonas and providencia    Urinary tract infection  -Urine culture with Pseudomonas and E. Coli  -Continue with current antibiotics    GI bleed  -Patient had coffee-ground emesis on admission  -GI evaluated the patient and recommending conservative management  -H&H stable, continue PPI    Covid pneumonia   -Chest x-ray with minimal right lower lobe infiltrate  -Patient is not a candidate for remdesivir or Decadron as patient not hypoxic  -Continue vitamin C and zinc  -On Flagyl to cover aspiration    Hypophos, replete    Discharge disposition: SNF, Likely 24-48h    Signed By: Dominga Ferry, MD     June 07, 2019

## 2019-06-07 NOTE — Progress Notes (Addendum)
0240-  Critical value received- lactic 2.4. NP notified.        Verbal shift change report given to Mo, RN (oncoming nurse) by Danielle, RN (offgoing nurse). Report included the following information SBAR, Kardex, ED Summary, Procedure Summary, Intake/Output, MAR, Accordion, Recent Results, Med Rec Status and Cardiac Rhythm NSR     Patient Vitals for the past 12 hrs:   Temp Pulse Resp BP SpO2   06/07/19 0651 97.9 ??F (36.6 ??C) 62 26 (!) 103/53 ???   06/07/19 0415 98 ??F (36.7 ??C) 64 13 (!) 102/57 94 %   06/07/19 0145 98.4 ??F (36.9 ??C) 70 16 96/65 95 %   06/06/19 2035 ??? ??? ??? (!) 101/59 ???   06/06/19 2033 ??? 70 22 (!) 101/59 93 %     .

## 2019-06-07 NOTE — Progress Notes (Addendum)
NAME: Michael Lozano        DOB:  19-May-1934        MRN:  [id number]         ??              Assessment   :                                               Plan:  AKI on CKD stage 3 (creatinine~ 1.7 at baseline?)  Rhabdomyolysis  Severe Hypernatremia  Covid  Hypotension  Metabolic acidosis  Hypercalcemia  UTI Creatinine showing improvement (peak 7.7mg /dl); Down to 2.0mg /dl today. Non-oliguric;  prerenal +/- ATN    Na improved overall but slight bump up from 143 to 144 due to being off D5W yesterday for several hours   .   Ct D5W at 100cc/hr    Neutra-Phos supplementation  ??  CK 1862 to 1372    ??COVID treatment->ID on board    Holding Ramipril and statin for now           Subjective:     Chief Complaint:  Chart reviewed remotely (to limit spread of covid and conserve ppe). I spoke with RN. No events overnight. No more issues with access. IV D5W running    Review of Systems:    Symptom Y/N Comments  Symptom Y/N Comments   Fever/Chills    Chest Pain     Poor Appetite    Edema     Cough    Abdominal Pain     Sputum    Joint Pain     SOB/DOE    Pruritis/Rash     Nausea/vomit    Tolerating PT/OT     Diarrhea    Tolerating Diet     Constipation    Other       Could not obtain due to:      Objective:     VITALS:   Last 24hrs VS reviewed since prior progress note. Most recent are:  Visit Vitals  BP (!) 95/53   Pulse 61   Temp 97.9 ??F (36.6 ??C)   Resp 15   Ht 5\' 10"  (1.778 m)   Wt 81.2 kg (179 lb)   SpO2 97%   BMI 25.68 kg/m??       Intake/Output Summary (Last 24 hours) at 06/07/2019 1642  Last data filed at 06/07/2019 06/09/2019  Gross per 24 hour   Intake 390 ml   Output 1460 ml   Net -1070 ml      Telemetry Reviewed:     PHYSICAL EXAM:  Deferred      Lab Data Reviewed: (see below)    Medications Reviewed: (see below)    PMH/SH reviewed - no change compared to H&P  ________________________________________________________________________  Care Plan discussed  with:  Patient     Family      RN Y    Care Manager                    Consultant:          Comments   >50% of visit spent in counseling and coordination of care       ________________________________________________________________________  Michael Lozano [id number], MD     Procedures: see electronic medical records for all procedures/Xrays and details which  were not copied into this note but  were reviewed prior to creation of Plan.      LABS:  Recent Labs     06/07/19  0110 06/06/19  0303   WBC 6.9 6.9   HGB 10.1* 10.2*   HCT 31.5* 31.8*   PLT 205 197     Recent Labs     06/07/19  0110 06/06/19  0303 06/05/19  0428   NA 144 143 148*   K 4.1 3.9 3.7   CL 115* 114* 117*   CO2 20* 22 23   BUN 29* 36* 53*   CREA 2.02* 2.32* 3.15*   GLU 78 134* 113*   CA 7.8* 8.0* 8.1*   MG 2.2 2.3  --    PHOS 1.3* 2.1*  --      Recent Labs     06/05/19  0428 06/04/19  1825   AP 76 77   TP 6.9 6.8   ALB 2.3* 2.3*   GLOB 4.6* 4.5*     No results for input(s): INR, PTP, APTT, INREXT, INREXT in the last 72 hours.   No results for input(s): FE, TIBC, PSAT, FERR in the last 72 hours.   No results found for: FOL, RBCF   No results for input(s): PH, PCO2, PO2 in the last 72 hours.  No results for input(s): CPK, CKMB in the last 72 hours.    No lab exists for component: TROPONINI  No components found for: [facility]  Lab Results   Component Value Date/Time    Color DARK YELLOW 06/02/2019 01:00 PM    Appearance CLOUDY (A) 06/02/2019 01:00 PM    Specific gravity 1.023 06/02/2019 01:00 PM    pH (UA) 5.0 06/02/2019 01:00 PM    Protein Negative 06/02/2019 01:00 PM    Glucose Negative 06/02/2019 01:00 PM    Ketone TRACE (A) 06/02/2019 01:00 PM    Bilirubin NEGATIVE  08/09/2011 11:25 PM    Urobilinogen 0.2 06/02/2019 01:00 PM    Nitrites Positive (A) 06/02/2019 01:00 PM    Leukocyte Esterase SMALL (A) 06/02/2019 01:00 PM    Epithelial cells FEW 06/02/2019 01:00 PM    Bacteria Negative 06/02/2019 01:00 PM    WBC 0-4 06/02/2019 01:00 PM    RBC 0-5 06/02/2019 01:00 PM        MEDICATIONS:  Current Facility-Administered Medications   Medication Dose Route Frequency   ??? sodium phosphate 30 mmol in 0.9% sodium chloride 250 mL infusion   IntraVENous ONCE   ??? potassium, sodium phosphates (NEUTRA-PHOS) packet 2 Packet  2 Packet Oral BID   ??? albumin human 25% (BUMINATE) solution 12.5 g  12.5 g IntraVENous Q8H PRN   ??? insulin lispro (HUMALOG) injection   SubCUTAneous AC&HS   ??? gabapentin (NEURONTIN) capsule 300 mg  300 mg Oral BID   ??? zinc sulfate (ZINCATE) 50 mg zinc (220 mg) capsule 1 Cap  1 Cap Oral DAILY   ??? ascorbic acid (vitamin C) (VITAMIN C) tablet 500 mg  500 mg Oral BID   ??? pantoprazole (PROTONIX) tablet 40 mg  40 mg Oral BID   ??? cholecalciferol (VITAMIN D3) (1000 Units /25 mcg) tablet 2,000 Units  2,000 Units Oral DAILY   ??? sucralfate (CARAFATE) tablet 1 g  1 g Oral AC&HS   ??? cefepime (MAXIPIME) 2 g in 0.9% sodium chloride (MBP/ADV) 100 mL MBP  2 g IntraVENous Q24H   ??? docusate sodium (COLACE) capsule 100 mg  100 mg Oral DAILY   ??? HYDROmorphone (PF) (DILAUDID) injection 0.5 mg  0.5 mg IntraVENous Q4H PRN   ??? dextrose 5% infusion  100 mL/hr IntraVENous CONTINUOUS   ??? albuterol (PROVENTIL HFA, VENTOLIN HFA, PROAIR HFA) inhaler 1 Puff  1 Puff Inhalation Q4H PRN   ??? glucose chewable tablet 16 g  4 Tab Oral PRN   ??? dextrose (D50W) injection syrg 12.5-25 g  25-50 mL IntraVENous PRN   ??? glucagon (GLUCAGEN) injection 1 mg  1 mg IntraMUSCular PRN   ??? acetaminophen (TYLENOL) tablet 650 mg  650 mg Oral Q6H PRN    Or   ??? acetaminophen (TYLENOL) suppository 650 mg  650 mg Rectal Q6H PRN   ??? acetaminophen (TYLENOL) tablet 650 mg  650 mg Oral Q6H   ??? sodium chloride (NS) flush 5-40 mL  5-40 mL IntraVENous Q8H   ??? sodium chloride (NS) flush 5-40 mL  5-40 mL IntraVENous PRN   ??? polyethylene glycol (MIRALAX) packet 17 g  17 g Oral DAILY PRN   ??? promethazine (PHENERGAN) tablet 12.5 mg  12.5 mg Oral Q6H PRN    Or   ??? ondansetron (ZOFRAN) injection 4 mg  4 mg IntraVENous Q6H PRN

## 2019-06-07 NOTE — Progress Notes (Signed)
1425- Patient had a 18 beat run of v-tach asymptomatic physician notified.

## 2019-06-07 NOTE — Progress Notes (Signed)
Problem: Falls - Risk of  Goal: *Absence of Falls  Description: Document Schmid Fall Risk and appropriate interventions in the flowsheet.  Outcome: Progressing Towards Goal  Note: Fall Risk Interventions:  Mobility Interventions: Patient to call before getting OOB, Bed/chair exit alarm, Communicate number of staff needed for ambulation/transfer, Utilize walker, cane, or other assistive device    Mentation Interventions: Bed/chair exit alarm, Door open when patient unattended, Room close to nurse's station    Medication Interventions: Patient to call before getting OOB, Teach patient to arise slowly, Bed/chair exit alarm, Evaluate medications/consider consulting pharmacy    Elimination Interventions: Stay With Me (per policy), Patient to call for help with toileting needs, Call light in reach, Bed/chair exit alarm              Problem: Patient Education: Go to Patient Education Activity  Goal: Patient/Family Education  Outcome: Progressing Towards Goal     Problem: Pressure Injury - Risk of  Goal: *Prevention of pressure injury  Description: Document Braden Scale and appropriate interventions in the flowsheet.  Outcome: Progressing Towards Goal  Note: Pressure Injury Interventions:  Sensory Interventions: Keep linens dry and wrinkle-free, Minimize linen layers, Float heels    Moisture Interventions: Minimize layers, Absorbent underpads, Internal/External urinary devices    Activity Interventions: Increase time out of bed, Pressure redistribution bed/mattress(bed type), PT/OT evaluation    Mobility Interventions: Pressure redistribution bed/mattress (bed type), Turn and reposition approx. every two hours(pillow and wedges)    Nutrition Interventions: Document food/fluid/supplement intake    Friction and Shear Interventions: Apply protective barrier, creams and emollients                Problem: Patient Education: Go to Patient Education Activity  Goal: Patient/Family Education  Outcome: Progressing Towards Goal      Problem: Discharge Planning  Goal: *Discharge to safe environment  Outcome: Progressing Towards Goal     Problem: Patient Education: Go to Patient Education Activity  Goal: Patient/Family Education  Outcome: Progressing Towards Goal     Problem: Patient Education: Go to Patient Education Activity  Goal: Patient/Family Education  Outcome: Progressing Towards Goal

## 2019-06-07 NOTE — Progress Notes (Signed)
0240-  Critical value received- lactic 2.4. NP notified.        Verbal shift change report given to Mo, RN (Cabin crew) by Duwayne Heck, RN (offgoing nurse). Report included the following information SBAR, Kardex, ED Summary, Procedure Summary, Intake/Output, MAR, Accordion, Recent Results, Med Rec Status and Cardiac Rhythm NSR     Patient Vitals for the past 12 hrs:   Temp Pulse Resp BP SpO2   06/07/19 0651 97.9 F (36.6 C) 62 26 (!) 103/53 --   06/07/19 0415 98 F (36.7 C) 64 13 (!) 102/57 94 %   06/07/19 0145 98.4 F (36.9 C) 70 16 96/65 95 %   06/06/19 2035 -- -- -- (!) 101/59 --   06/06/19 2033 -- 70 22 (!) 101/59 93 %     .

## 2019-06-07 NOTE — Progress Notes (Signed)
Infectious Disease Progress Note       Subjective:     Michael Lozano seen  Complains of nausea              Objective:    Vitals:   Patient Vitals for the past 24 hrs:   Temp Pulse Resp BP SpO2   06/07/19 0651 97.9 ??F (36.6 ??C) 62 26 (!) 103/53 ???   06/07/19 0415 98 ??F (36.7 ??C) 64 13 (!) 102/57 94 %   06/07/19 0145 98.4 ??F (36.9 ??C) 70 16 96/65 95 %   06/06/19 2035 ??? ??? ??? (!) 101/59 ???   06/06/19 2033 ??? 70 22 (!) 101/59 93 %   06/06/19 1922 97.4 ??F (36.3 ??C) 81 16 (!) 100/55 95 %   06/06/19 1543 98.2 ??F (36.8 ??C) 67 13 107/61 99 %   06/06/19 1246 98.2 ??F (36.8 ??C) 100 25 (!) 94/59 100 %       Physical Exam:  Gen: No apparent distress  HEENT:   No scleral icterus, no thrush   CV: S1,2 heard regularly on monitor  Lungs: non labored breathing   Abdomen: soft, non tender,   Skin: no rash on face, extremities   Musculoskeletal:  + R plantar wound, no surrounding erythema    Medications:    Current Facility-Administered Medications:   ???  sodium phosphate 30 mmol in 0.9% sodium chloride 250 mL infusion, , IntraVENous, ONCE, Pervis Hocking, MD  ???  potassium, sodium phosphates (NEUTRA-PHOS) packet 2 Packet, 2 Packet, Oral, BID, Patel, Deep V, MD  ???  albumin human 25% (BUMINATE) solution 12.5 g, 12.5 g, IntraVENous, Q8H PRN, Posey Pronto, Deep V, MD  ???  insulin lispro (HUMALOG) injection, , SubCUTAneous, AC&HS, Meyer Russel, NP-C, Stopped at 06/06/19 2200  ???  gabapentin (NEURONTIN) capsule 300 mg, 300 mg, Oral, BID, Theodore Demark R, NP-C, 300 mg at 06/07/19 1610  ???  zinc sulfate (ZINCATE) 50 mg zinc (220 mg) capsule 1 Cap, 1 Cap, Oral, DAILY, Theodore Demark R, NP-C, 1 Cap at 06/07/19 9604  ???  ascorbic acid (vitamin C) (VITAMIN C) tablet 500 mg, 500 mg, Oral, BID, Theodore Demark R, NP-C, 500 mg at 06/07/19 5409  ???  pantoprazole (PROTONIX) tablet 40 mg, 40 mg, Oral, BID, Theodore Demark R, NP-C, 40 mg at 06/07/19 8119  ???  cholecalciferol (VITAMIN D3) (1000 Units /25 mcg) tablet 2,000 Units, 2,000 Units, Oral, DAILY, Theodore Demark R, NP-C, 2,000 Units at 06/07/19 1478  ???  sucralfate (CARAFATE) tablet 1 g, 1 g, Oral, AC&HS, Theodore Demark R, NP-C, 1 g at 06/07/19 2956  ???  cefepime (MAXIPIME) 2 g in 0.9% sodium chloride (MBP/ADV) 100 mL MBP, 2 g, IntraVENous, Q24H, Iziah Cates R, DO, Last Rate: 200 mL/hr at 06/05/19 1549, 2 g at 06/05/19 1549  ???  docusate sodium (COLACE) capsule 100 mg, 100 mg, Oral, DAILY, Theodore Demark R, NP-C, 100 mg at 06/07/19 2130  ???  HYDROmorphone (PF) (DILAUDID) injection 0.5 mg, 0.5 mg, IntraVENous, Q4H PRN, Arta Silence, NP, 0.5 mg at 06/05/19 0835  ???  dextrose 5% infusion, 100 mL/hr, IntraVENous, CONTINUOUS, Patel, Deep V, MD, Last Rate: 100 mL/hr at 06/07/19 0020, 100 mL/hr at 06/07/19 0020  ???  albuterol (PROVENTIL HFA, VENTOLIN HFA, PROAIR HFA) inhaler 1 Puff, 1 Puff, Inhalation, Q4H PRN, Erline Levine, NP  ???  glucose chewable tablet 16 g, 4 Tab, Oral, PRN, Erline Levine, NP  ???  dextrose (D50W) injection syrg 12.5-25 g,  25-50 mL, IntraVENous, PRN, Tonette Bihari, NP  ???  glucagon (GLUCAGEN) injection 1 mg, 1 mg, IntraMUSCular, PRN, Tonette Bihari, NP  ???  acetaminophen (TYLENOL) tablet 650 mg, 650 mg, Oral, Q6H PRN **OR** acetaminophen (TYLENOL) suppository 650 mg, 650 mg, Rectal, Q6H PRN, Bevelyn Buckles, Kasandra Knudsen, NP  ???  acetaminophen (TYLENOL) tablet 650 mg, 650 mg, Oral, Q6H, Sinnatamby, Diane S, MD, 650 mg at 06/07/19 6606  ???  sodium chloride (NS) flush 5-40 mL, 5-40 mL, IntraVENous, Q8H, Lockard, Sara H, NP, 10 mL at 06/07/19 3016  ???  sodium chloride (NS) flush 5-40 mL, 5-40 mL, IntraVENous, PRN, Bevelyn Buckles, Kasandra Knudsen, NP  ???  polyethylene glycol (MIRALAX) packet 17 g, 17 g, Oral, DAILY PRN, Bevelyn Buckles, Kasandra Knudsen, NP  ???  promethazine (PHENERGAN) tablet 12.5 mg, 12.5 mg, Oral, Q6H PRN **OR** ondansetron (ZOFRAN) injection 4 mg, 4 mg, IntraVENous, Q6H PRN, Nila Nephew, NP, 4 mg at 06/05/19 1320      Labs:  Recent Results (from the past 24 hour(s))   GLUCOSE, POC    Collection Time: 06/06/19  5:14 PM   Result Value  Ref Range    Glucose (POC) 172 (H) 65 - 100 mg/dL    Performed by Saunders Revel    GLUCOSE, POC    Collection Time: 06/06/19  8:38 PM   Result Value Ref Range    Glucose (POC) 168 (H) 65 - 100 mg/dL    Performed by Ezra Sites    LACTIC ACID    Collection Time: 06/07/19  1:10 AM   Result Value Ref Range    Lactic acid 2.4 (HH) 0.4 - 2.0 MMOL/L   MAGNESIUM    Collection Time: 06/07/19  1:10 AM   Result Value Ref Range    Magnesium 2.2 1.6 - 2.4 mg/dL   PHOSPHORUS    Collection Time: 06/07/19  1:10 AM   Result Value Ref Range    Phosphorus 1.3 (L) 2.6 - 4.7 MG/DL   METABOLIC PANEL, BASIC    Collection Time: 06/07/19  1:10 AM   Result Value Ref Range    Sodium 144 136 - 145 mmol/L    Potassium 4.1 3.5 - 5.1 mmol/L    Chloride 115 (H) 97 - 108 mmol/L    CO2 20 (L) 21 - 32 mmol/L    Anion gap 9 5 - 15 mmol/L    Glucose 78 65 - 100 mg/dL    BUN 29 (H) 6 - 20 MG/DL    Creatinine 0.10 (H) 0.70 - 1.30 MG/DL    BUN/Creatinine ratio 14 12 - 20      GFR est AA 39 (L) >60 ml/min/1.1m2    GFR est non-AA 32 (L) >60 ml/min/1.54m2    Calcium 7.8 (L) 8.5 - 10.1 MG/DL   CBC WITH AUTOMATED DIFF    Collection Time: 06/07/19  1:10 AM   Result Value Ref Range    WBC 6.9 4.1 - 11.1 K/uL    RBC 3.58 (L) 4.10 - 5.70 M/uL    HGB 10.1 (L) 12.1 - 17.0 g/dL    HCT 93.2 (L) 35.5 - 50.3 %    MCV 88.0 80.0 - 99.0 FL    MCH 28.2 26.0 - 34.0 PG    MCHC 32.1 30.0 - 36.5 g/dL    RDW 73.2 (H) 20.2 - 14.5 %    PLATELET 205 150 - 400 K/uL    MPV 12.3 8.9 - 12.9 FL    NRBC 0.0 0 PER 100  WBC    ABSOLUTE NRBC 0.00 0.00 - 0.01 K/uL    NEUTROPHILS 72 32 - 75 %    LYMPHOCYTES 15 12 - 49 %    MONOCYTES 10 5 - 13 %    EOSINOPHILS 3 0 - 7 %    BASOPHILS 0 0 - 1 %    IMMATURE GRANULOCYTES 0 0.0 - 0.5 %    ABS. NEUTROPHILS 4.9 1.8 - 8.0 K/UL    ABS. LYMPHOCYTES 1.0 0.8 - 3.5 K/UL    ABS. MONOCYTES 0.7 0.0 - 1.0 K/UL    ABS. EOSINOPHILS 0.2 0.0 - 0.4 K/UL    ABS. BASOPHILS 0.0 0.0 - 0.1 K/UL    ABS. IMM. GRANS. 0.0 0.00 - 0.04 K/UL    DF AUTOMATED      SAMPLES BEING HELD    Collection Time: 06/07/19  1:10 AM   Result Value Ref Range    SAMPLES BEING HELD 1BLUE,1LAV,1GOLD,1RED     COMMENT        Add-on orders for these samples will be processed based on acceptable specimen integrity and analyte stability, which may vary by analyte.   SAMPLES BEING HELD    Collection Time: 06/07/19  1:10 AM   Result Value Ref Range    SAMPLES BEING HELD  1 BLUE     COMMENT        Add-on orders for these samples will be processed based on acceptable specimen integrity and analyte stability, which may vary by analyte.   GLUCOSE, POC    Collection Time: 06/07/19  9:15 AM   Result Value Ref Range    Glucose (POC) 118 (H) 65 - 100 mg/dL    Performed by Fabio Pierce    GLUCOSE, POC    Collection Time: 06/07/19 12:19 PM   Result Value Ref Range    Glucose (POC) 183 (H) 65 - 100 mg/dL    Performed by Dorna Bloom            Micro:     Blood: 06/02/19  Special Requests: NO SPECIAL REQUESTS  ?? Preliminary   Culture result: NO GROWTH 2 DAYS              Urine:   Specimen Information: Cath Urine   ??    Component Value Ref Range & Units Status   Special Requests: NO SPECIAL REQUESTS  ?? Preliminary   Colony Count >100,000   COLONIES/mL  ?? Preliminary   Culture result: GRAM NEGATIVE RODSAbnormal           05/17/19 Wound   Foot; Wound    ??    Component Value Ref Range & Units Status   Special Requests: NO SPECIAL REQUESTS  ?? Final   GRAM STAIN NO WBC'S SEEN  ?? Final   GRAM STAIN FEW GRAM POSITIVE COCCI  ?? Final   GRAM STAIN FEW GRAM POSITIVE RODS  ?? Final   Culture result: Abnormal   ?? Final   LIGHT ENTEROBACTER CLOACAE COMPLEX    Culture result: LIGHT PROVIDENCIA RETTGERIAbnormal   ?? Final   Culture result:  ?? Final   LIGHT MIXED SKIN FLORA ISOLATED    Susceptibility     Enterobacter cloacae complex Providencia rettgeri     MIC MIC    Amikacin ($) <=2 ug/mL S <=2 ug/mL S    Ampicillin ($)   >=32 ug/mL R    Ampicillin/sulbactam ($)   >=32 ug/mL R    Cefazolin ($) >=64 ug/mL R >=64 ug/mL  R     Cefepime ($$) <=1 ug/mL S <=1 ug/mL S    Cefoxitin >=64 ug/mL R <=4 ug/mL S    Ceftazidime ($) <=1 ug/mL S <=1 ug/mL S    Ceftriaxone ($) <=1 ug/mL S <=1 ug/mL S    Ciprofloxacin ($) <=0.25 ug/mL S <=0.25 ug/mL S    Gentamicin ($) <=1 ug/mL S <=1 ug/mL S    Levofloxacin ($) <=0.12 ug/mL S <=0.12 ug/mL S    Meropenem ($$) <=0.25 ug/mL S <=0.25 ug/mL S    Piperacillin/Tazobac ($) <=4 ug/mL S <=4 ug/mL S    Tobramycin ($) 8 ug/mL I <=1 ug/mL S    Trimeth/Sulfa >=320 ug/mL R              Result History          Pathology:  08/12/2011  FINAL PATHOLOGIC DIAGNOSIS  1. Bone, left fifth metatarsal head, biopsy:  Acute osteomyelitis  2. Bone, left fifth metatarsal head, biopsy:  Acute osteomyelitis      Imaging:  CT RLE  ??  FINDINGS: The evaluation for infection is partially limited secondary to the  lack of IV contrast. There is an ulcer plantar to the second MTP joint measuring  17 x 20 mm. There is no evidence of a drainable fluid collection. There is no  subcutaneous emphysema.  ??  The bones are mildly osteopenic. There is dorsal subluxation at the first MTP  joint. There are extensive erosive changes involving the first MTP joint that  have a chronic appearance. The patient is status post fifth MTP arthrotomy. No  acute fracture or dislocation. No definite evidence of acute osteomyelitis.  ??  IMPRESSION  ??  1. Ulcer plantar to the second MTP joint. No evidence of a focal drainable fluid  collection.  2. Dorsal subluxation at the first MTP joint with erosive changes that appear  chronic. These findings may be related to inflammatory arthropathy, gout, or  prior septic arthritis. No definite evidence of acute osteomyelitis.    CT A/P  FINDINGS:   LOWER THORAX: Atelectasis at the lung bases  LIVER: Probable cyst left hepatic lobe  BILIARY TREE: Stones in a nondistended gallbladder. CBD is not dilated.  SPLEEN: within normal limits.  PANCREAS: No focal abnormality.  ADRENALS: Unremarkable.  KIDNEYS/URETERS: Probable cyst  left kidney but no hydronephrosis  STOMACH: Unremarkable.  SMALL BOWEL: No dilatation or wall thickening.  COLON: Diverticulosis of the left colon. No evidence of acute diverticulitis  APPENDIX: Surgically absent  PERITONEUM: No ascites or pneumoperitoneum.  RETROPERITONEUM: No lymphadenopathy or aortic aneurysm. There are vascular  calcifications  REPRODUCTIVE ORGANS: Not enlarged  URINARY BLADDER: Decompressed by Foley  BONES: No destructive bone lesion.  ABDOMINAL WALL: Umbilical hernia containing fat  ADDITIONAL COMMENTS: N/A  ??  IMPRESSION  ??  1. No hydronephrosis  2. Diverticulosis. No evidence of acute diverticulitis  3. Gallstones. No evidence of acute cholecystitis    CT head  ??  FINDINGS:  Intervals and cisterns are mildly enlarged compatible with the overall degree of  volume loss. Encephalomalacia right posterior frontal lobe and parietal lobe.  Mild low density within the periventricular white matter. There is no  intracranial hemorrhage, extra-axial collection, or mass effect. The basilar  cisterns are open. No CT evidence of acute infarct.  ??  The bone windows demonstrate no abnormalities. Minimal sinus mucosal  inflammatory change.  ??  IMPRESSION    Encephalomalacia posterior left frontal and parietal lobe. No acute intracranial  Abnormality  06/02/19  Indication: Coffee-ground emesis, leukocytosis  ??  Comparison to earlier the same day. Portable exam obtained at 1942 demonstrates  minimal right lower lobe infiltrate, slightly increased compared to the prior  exam.  ??  IMPRESSION  Minimal right lower lobe infiltrate is slightly increased in the  interval.  ??    06/02/19  FINDINGS: Three views of the right foot demonstrate that is post surgical  correction of the distal fifth digit. Hyperextension at the first MTP joint with  erosive change.. The soft tissues are within normal limits.  ??  IMPRESSION  ??  No definite plain film evidence for ostomy myelitis..    Assessment / Plan    Michael Lozano is a  85 year old gentleman with a history of diabetes, left diabetic foot infection, osteomyelitis status post left fifth metatarsal head resection in 2013, chronic right plantar wound diagnosed, indwelling cardiac device/pacemaker with SARS-CoV-2 on 05/18/2019. He was seen in the emergency room on 4/2 and 05/28/2019.  He presented with fever generalized body aches on 05/18/2019 and had a positive rapid SARS-CoV-2.  Appears he was discharged home and went back to the ER on 05/28/2019 with a chief complaint of fatigue and not eating or drinking.  He was brought to the emergency room by his grandson per the ER note with decline and very poor p.o. intake.  There was also concerns for periods of disorientation and confusion.  Initially found to be hyponatremic with a sodium of 163 and a creatinine of 6.6 and a BUN of 130.  Labs also showed elevated CK, leukocytosis in the range of 12-16, anemia, elevated inflammatory markers.  His urine on 06/02/2019 had 0-4 white cells and negative for bacteria.  Blood culture sent during this admission is negative.  Urine culture shows greater than 100,000 gram-negative rods.  Wound culture report in his foot wound from 05/17/19 resulted with Enterobacter cloacae and Providencia rettgeri, light mixed skin flora.  He has had imaging with an x-ray as well as a CT scan of the right lower extremity and reported as no evidence of focal drainable fluid collection and no evidence of acute osteomyelitis.  He was started on antibiotics which included vancomycin cefepime and Flagyl.      1) chronic right lower extremity plantar wound, DM foot wound, Wound cx  05/17/19 + for Enterobacter and Providentia  2)severe hypernatremia  3) AKI  4) COVID + 05/17/19 , 06/02/19, on room air   5)Concern for GI bleed and risk for aspiration   6) DM  7) Urine cx wt Pseudomonas, Ecoli        In regards to the right plantar wound, appears chronic without any cellulitis  Wound cultures from 05/17/2019 + for Enterobacter , Providentia  for which he is on Cefepime.  Cefepime can cause akinetic seizures and altered mental status in the elderly and continue to monitor.   tolerating well so far except for nausea  Antiemetics per primary team   Stop cefepime 06/09/19  Stop flagyl today  Needs good wound care  Supportive care for SARS-CoV-2  Will sign off            Thank you for the opportunity to participate in the care of this patient.   Please contact with questions or concerns.      Arlyce Harman, DO   12:38 PM

## 2019-06-07 NOTE — Progress Notes (Signed)
Physician Progress Note      PATIENT:               Michael Lozano, Michael Lozano  CSN #:                  [id number]  DOB:                       Jun 27, 1934  ADMIT DATE:       06/02/2019 12:08 PM  DISCH DATE:  RESPONDING  PROVIDER #:        Dominga Ferry MD          QUERY TEXT:    Patient admitted with AKI. Patient with recent Covid infection and noted positive Covid test on 4/17.  If possible, please document in progress notes and discharge summary if patient has an active Covid-19 infection or if patient is testing positive for Covid-19 without a current active infection:    The medical record reflects the following:  Risk Factors: 85 y/o male admitted with AKI, hx of recent COVID infection, PM,  DM2,  Clinical Indicators: poor p.o. intake, somnolent and less interactive, hypernatremic and hypotensive, coffee ground emesis, per lab result 4/17 "Sars detected" per CXR Minimal right lower lobe infiltrate is slightly increased in the interval  Treatment: Cefepime, IVF bolus 2.5L, Rocephin, Decadron, Zinc, Vanco  Options provided:  -- Active Covid-19 infection is being treated and/or evaluated on current encounter  -- Positive Covid test result without an active current Covid-19 infection  -- Other - I will add my own diagnosis  -- Disagree - Not applicable / Not valid  -- Disagree - Clinically unable to determine / Unknown  -- Refer to Clinical Documentation Reviewer    PROVIDER RESPONSE TEXT:    Patient is testing positive for Covid without an active Covid-19 infection.    Query created by: Prudencio Burly on 06/05/2019 12:13 PM      Electronically signed by:  Dominga Ferry MD 06/07/2019 12:19 PM

## 2019-06-07 NOTE — Progress Notes (Signed)
Problem: Falls - Risk of  Goal: *Absence of Falls  Description: Document Michael Lozano Fall Risk and appropriate interventions in the flowsheet.  Outcome: Progressing Towards Goal  Note: Fall Risk Interventions:  Mobility Interventions: Patient to call before getting OOB, Bed/chair exit alarm, Communicate number of staff needed for ambulation/transfer, Utilize walker, cane, or other assistive device    Mentation Interventions: Bed/chair exit alarm, Door open when patient unattended, Room close to nurse's station    Medication Interventions: Patient to call before getting OOB, Teach patient to arise slowly, Bed/chair exit alarm, Evaluate medications/consider consulting pharmacy    Elimination Interventions: Stay With Me (per policy), Patient to call for help with toileting needs, Call light in reach, Bed/chair exit alarm              Problem: Patient Education: Go to Patient Education Activity  Goal: Patient/Family Education  Outcome: Progressing Towards Goal     Problem: Pressure Injury - Risk of  Goal: *Prevention of pressure injury  Description: Document Braden Scale and appropriate interventions in the flowsheet.  Outcome: Progressing Towards Goal  Note: Pressure Injury Interventions:  Sensory Interventions: Keep linens dry and wrinkle-free, Minimize linen layers, Float heels    Moisture Interventions: Minimize layers, Absorbent underpads, Internal/External urinary devices    Activity Interventions: Increase time out of bed, Pressure redistribution bed/mattress(bed type), PT/OT evaluation    Mobility Interventions: Pressure redistribution bed/mattress (bed type), Turn and reposition approx. every two hours(pillow and wedges)    Nutrition Interventions: Document food/fluid/supplement intake    Friction and Shear Interventions: Apply protective barrier, creams and emollients                Problem: Patient Education: Go to Patient Education Activity  Goal: Patient/Family Education  Outcome: Progressing Towards Goal      Problem: Discharge Planning  Goal: *Discharge to safe environment  Outcome: Progressing Towards Goal     Problem: Patient Education: Go to Patient Education Activity  Goal: Patient/Family Education  Outcome: Progressing Towards Goal     Problem: Patient Education: Go to Patient Education Activity  Goal: Patient/Family Education  Outcome: Progressing Towards Goal

## 2019-06-07 NOTE — Progress Notes (Signed)
Progress  Notes by Philmore Pali, MD at 06/07/19 1642                Author: Philmore Pali, MD  Service: Nephrology  Author Type: Physician       Filed: 06/07/19 1645  Date of Service: 06/07/19 1642  Status: Addendum          Editor: Philmore Pali, MD (Physician)          Related Notes: Original Note by Philmore Pali, MD (Physician) filed at 06/07/19 1644                                                                                                 NAME: Michael Lozano         DOB:  06-15-1934         MRN:  [id number]            ??               Assessment   :                                                Plan:      AKI on CKD stage 3 (creatinine~ 1.7 at baseline?)   Rhabdomyolysis   Severe Hypernatremia   Covid   Hypotension   Metabolic acidosis   Hypercalcemia   UTI  Creatinine showing improvement (peak 7.7mg /dl); Down to 2.0mg /dl today. Non-oliguric;  prerenal +/- ATN      Na improved overall but slight bump up from 143 to 144 due to being off D5W yesterday for several hours    .    Ct D5W at 100cc/hr      Neutra-Phos supplementation   ??   CK 1862 to 1372      ??COVID treatment->ID on board      Holding Ramipril and statin for now                   Subjective:        Chief Complaint:  Chart reviewed remotely (to limit spread of covid and conserve ppe). I spoke with RN. No events overnight. No more issues with access. IV D5W running      Review of Systems:              Symptom  Y/N  Comments    Symptom  Y/N  Comments             Fever/Chills        Chest Pain                 Poor Appetite        Edema                 Cough        Abdominal Pain         Sputum        Joint Pain  SOB/DOE        Pruritis/Rash         Nausea/vomit        Tolerating PT/OT         Diarrhea        Tolerating Diet                 Constipation        Other               Could not obtain due to:            Objective:        VITALS:    Last 24hrs VS reviewed since prior progress note. Most recent are:   Visit Vitals      BP  (!)  95/53     Pulse  61     Temp  97.9 ??F (36.6 ??C)     Resp  15     Ht  5\' 10"  (1.778 m)     Wt  81.2 kg (179 lb)     SpO2  97%        BMI  25.68 kg/m??           Intake/Output Summary (Last 24 hours) at 06/07/2019 1642   Last data filed at 06/07/2019 06/09/2019     Gross per 24 hour        Intake  390 ml        Output  1460 ml        Net  -1070 ml         Telemetry Reviewed:       PHYSICAL EXAM:   Deferred         Lab Data Reviewed: (see below)      Medications Reviewed: (see below)      PMH/SH reviewed - no change compared to H&P   ________________________________________________________________________   Care Plan discussed with:       Patient             Family              RN  Y           Care Manager                            Consultant:                     Comments         >50% of visit spent in counseling and coordination of care            ________________________________________________________________________   Stefani Baik [id number], MD       Procedures: see electronic medical records for all procedures/Xrays and details which   were not copied into this note but were reviewed prior to creation of Plan.        LABS:     Recent Labs            06/07/19   0110  06/06/19   0303     WBC  6.9  6.9     HGB  10.1*  10.2*     HCT  31.5*  31.8*         PLT  205  197          Recent Labs             06/07/19   0110  06/06/19  0303  06/05/19   0428     NA  144  143  148*     K  4.1  3.9  3.7     CL  115*  114*  117*     CO2  20*  22  23     BUN  29*  36*  53*     CREA  2.02*  2.32*  3.15*     GLU  78  134*  113*     CA  7.8*  8.0*  8.1*     MG  2.2  2.3   --           PHOS  1.3*  2.1*   --           Recent Labs            06/05/19   0428  06/04/19   1825     AP  76  77     TP  6.9  6.8     ALB  2.3*  2.3*         GLOB  4.6*  4.5*        No results for input(s): INR, PTP, APTT, INREXT, INREXT in the last 72 hours.    No results for input(s): FE, TIBC, PSAT, FERR in the last 72 hours.    No results found for: FOL, RBCF    No results for  input(s): PH, PCO2, PO2 in the last 72 hours.   No results for input(s): CPK, CKMB in the last 72 hours.      No lab exists for component: TROPONINI   No components found for: [facility]     Lab Results         Component  Value  Date/Time            Color  DARK YELLOW  06/02/2019 01:00 PM       Appearance  CLOUDY (A)  06/02/2019 01:00 PM       Specific gravity  1.023  06/02/2019 01:00 PM       pH (UA)  5.0  06/02/2019 01:00 PM       Protein  Negative  06/02/2019 01:00 PM       Glucose  Negative  06/02/2019 01:00 PM       Ketone  TRACE (A)  06/02/2019 01:00 PM       Bilirubin  NEGATIVE   08/09/2011 11:25 PM       Urobilinogen  0.2  06/02/2019 01:00 PM       Nitrites  Positive (A)  06/02/2019 01:00 PM       Leukocyte Esterase  SMALL (A)  06/02/2019 01:00 PM       Epithelial cells  FEW  06/02/2019 01:00 PM       Bacteria  Negative  06/02/2019 01:00 PM       WBC  0-4  06/02/2019 01:00 PM            RBC  0-5  06/02/2019 01:00 PM           MEDICATIONS:     Current Facility-Administered Medications          Medication  Dose  Route  Frequency           ?  sodium phosphate 30 mmol in 0.9% sodium chloride 250 mL infusion     IntraVENous  ONCE     ?  potassium, sodium phosphates (NEUTRA-PHOS) packet 2 Packet  2 Packet  Oral  BID     ?  albumin human 25% (BUMINATE) solution 12.5 g   12.5 g  IntraVENous  Q8H PRN     ?  insulin lispro (HUMALOG) injection     SubCUTAneous  AC&HS     ?  gabapentin (NEURONTIN) capsule 300 mg   300 mg  Oral  BID     ?  zinc sulfate (ZINCATE) 50 mg zinc (220 mg) capsule 1 Cap   1 Cap  Oral  DAILY     ?  ascorbic acid (vitamin C) (VITAMIN C) tablet 500 mg   500 mg  Oral  BID     ?  pantoprazole (PROTONIX) tablet 40 mg   40 mg  Oral  BID     ?  cholecalciferol (VITAMIN D3) (1000 Units /25 mcg) tablet 2,000 Units   2,000 Units  Oral  DAILY     ?  sucralfate (CARAFATE) tablet 1 g   1 g  Oral  AC&HS     ?  cefepime (MAXIPIME) 2 g in 0.9% sodium chloride (MBP/ADV) 100 mL MBP   2 g  IntraVENous  Q24H     ?   docusate sodium (COLACE) capsule 100 mg   100 mg  Oral  DAILY     ?  HYDROmorphone (PF) (DILAUDID) injection 0.5 mg   0.5 mg  IntraVENous  Q4H PRN           ?  dextrose 5% infusion   100 mL/hr  IntraVENous  CONTINUOUS           ?  albuterol (PROVENTIL HFA, VENTOLIN HFA, PROAIR HFA) inhaler 1 Puff   1 Puff  Inhalation  Q4H PRN     ?  glucose chewable tablet 16 g   4 Tab  Oral  PRN     ?  dextrose (D50W) injection syrg 12.5-25 g   25-50 mL  IntraVENous  PRN     ?  glucagon (GLUCAGEN) injection 1 mg   1 mg  IntraMUSCular  PRN     ?  acetaminophen (TYLENOL) tablet 650 mg   650 mg  Oral  Q6H PRN          Or           ?  acetaminophen (TYLENOL) suppository 650 mg   650 mg  Rectal  Q6H PRN     ?  acetaminophen (TYLENOL) tablet 650 mg   650 mg  Oral  Q6H     ?  sodium chloride (NS) flush 5-40 mL   5-40 mL  IntraVENous  Q8H     ?  sodium chloride (NS) flush 5-40 mL   5-40 mL  IntraVENous  PRN     ?  polyethylene glycol (MIRALAX) packet 17 g   17 g  Oral  DAILY PRN     ?  promethazine (PHENERGAN) tablet 12.5 mg   12.5 mg  Oral  Q6H PRN          Or           ?  ondansetron (ZOFRAN) injection 4 mg   4 mg  IntraVENous  Q6H PRN

## 2019-06-07 NOTE — Progress Notes (Signed)
Progress  Notes by Dominga Ferry, MD at 06/07/19 1037                Author: Dominga Ferry, MD  Service: Internal Medicine  Author Type: Physician       Filed: 06/07/19 1054  Date of Service: 06/07/19 1037  Status: Addendum          Editor: Dominga Ferry, MD (Physician)          Related Notes: Original Note by Dominga Ferry, MD (Physician) filed at 06/07/19 1053                                    [facility]                                                                                              Hospitalist Progress Note   Dominga Ferry, MD   Answering service: [phone] OR [phone] from in house phone                     Progress Note          Patient: Michael Lozano  MRN: [id number]   SSN: [id number]          Date of Birth: May 09, 1934   Age: 85 y.o.   Sex: male         Admit Date: 06/02/2019     LOS: 5 days         Subjective:        Patient initially admitted to ICU due to renal failure, hyponatremia and hypotension.  Patient was transferred out of ICU 4/21 for hospitalist service to assume care.  Patient complaining of neuropathy in his hands and legs and does not think that he  has ability to take care of himself at home.      No acute overnight events. No pain. NAD        Objective:          Vitals:             06/07/19 0145  06/07/19 0415  06/07/19 0651  06/07/19 0919           BP:  96/65  (!) 102/57  (!) 103/53       Pulse:  70  64  62       Resp:  16  13  26        Temp:  98.4 ??F (36.9 ??C)  98 ??F (36.7 ??C)  97.9 ??F (36.6 ??C)       SpO2:  95%  94%         Weight:        81.2 kg (179 lb)           Height:                    Intake and Output:   Current Shift: No intake/output data recorded.   Last three shifts: 04/20 1901 -  04/22 0700   In: 1240 [P.O.:390; I.V.:850]   Out: 2235 [Urine:2235]      Physical Exam:    GENERAL: alert, cooperative, no distress   LUNG: clear to auscultation bilaterally   HEART: regular rate and rhythm   ABDOMEN: soft, non-tender    EXTREMITIES:  extremities normal, atraumatic, no cyanosis or edema   SKIN: no rash or abnormalities   NEUROLOGIC: AOx3   PSYCHIATRIC: non focal      Lab/Data Review:   All lab results for the last 24 hours reviewed.         Recent Results (from the past 24 hour(s))     GLUCOSE, POC          Collection Time: 06/06/19 12:25 PM         Result  Value  Ref Range            Glucose (POC)  153 (H)  65 - 100 mg/dL       Performed by  Douangphoumy Spaphay P         GLUCOSE, POC          Collection Time: 06/06/19  5:14 PM         Result  Value  Ref Range            Glucose (POC)  172 (H)  65 - 100 mg/dL       Performed by  Daisy, POC          Collection Time: 06/06/19  8:38 PM         Result  Value  Ref Range            Glucose (POC)  168 (H)  65 - 100 mg/dL       Performed by  Noah Charon         LACTIC ACID          Collection Time: 06/07/19  1:10 AM         Result  Value  Ref Range            Lactic acid  2.4 (HH)  0.4 - 2.0 MMOL/L       MAGNESIUM          Collection Time: 06/07/19  1:10 AM         Result  Value  Ref Range            Magnesium  2.2  1.6 - 2.4 mg/dL       PHOSPHORUS          Collection Time: 06/07/19  1:10 AM         Result  Value  Ref Range            Phosphorus  1.3 (L)  2.6 - 4.7 MG/DL       METABOLIC PANEL, BASIC          Collection Time: 06/07/19  1:10 AM         Result  Value  Ref Range            Sodium  144  136 - 145 mmol/L       Potassium  4.1  3.5 - 5.1 mmol/L       Chloride  115 (H)  97 - 108 mmol/L       CO2  20 (L)  21 - 32 mmol/L       Anion gap  9  5 - 15 mmol/L       Glucose  78  65 - 100 mg/dL       BUN  29 (H)  6 - 20 MG/DL       Creatinine  9.50 (H)  0.70 - 1.30 MG/DL       BUN/Creatinine ratio  14  12 - 20         GFR est AA  39 (L)  >60 ml/min/1.74m2       GFR est non-AA  32 (L)  >60 ml/min/1.60m2       Calcium  7.8 (L)  8.5 - 10.1 MG/DL       CBC WITH AUTOMATED DIFF          Collection Time: 06/07/19  1:10 AM         Result  Value  Ref Range             WBC  6.9  4.1 - 11.1 K/uL       RBC  3.58 (L)  4.10 - 5.70 M/uL       HGB  10.1 (L)  12.1 - 17.0 g/dL       HCT  93.2 (L)  67.1 - 50.3 %       MCV  88.0  80.0 - 99.0 FL       MCH  28.2  26.0 - 34.0 PG       MCHC  32.1  30.0 - 36.5 g/dL       RDW  24.5 (H)  80.9 - 14.5 %       PLATELET  205  150 - 400 K/uL       MPV  12.3  8.9 - 12.9 FL       NRBC  0.0  0 PER 100 WBC       ABSOLUTE NRBC  0.00  0.00 - 0.01 K/uL       NEUTROPHILS  72  32 - 75 %       LYMPHOCYTES  15  12 - 49 %       MONOCYTES  10  5 - 13 %       EOSINOPHILS  3  0 - 7 %       BASOPHILS  0  0 - 1 %       IMMATURE GRANULOCYTES  0  0.0 - 0.5 %       ABS. NEUTROPHILS  4.9  1.8 - 8.0 K/UL       ABS. LYMPHOCYTES  1.0  0.8 - 3.5 K/UL       ABS. MONOCYTES  0.7  0.0 - 1.0 K/UL       ABS. EOSINOPHILS  0.2  0.0 - 0.4 K/UL       ABS. BASOPHILS  0.0  0.0 - 0.1 K/UL       ABS. IMM. GRANS.  0.0  0.00 - 0.04 K/UL       DF  AUTOMATED          SAMPLES BEING HELD          Collection Time: 06/07/19  1:10 AM         Result  Value  Ref Range            SAMPLES BEING HELD  1BLUE,1LAV,1GOLD,1RED         COMMENT                  Add-on orders for these samples  will be processed based on acceptable specimen integrity and analyte stability, which may vary by analyte.       SAMPLES BEING HELD          Collection Time: 06/07/19  1:10 AM         Result  Value  Ref Range            SAMPLES BEING HELD   1 BLUE         COMMENT                  Add-on orders for these samples will be processed based on acceptable specimen integrity and analyte stability, which may vary by analyte.       GLUCOSE, POC          Collection Time: 06/07/19  9:15 AM         Result  Value  Ref Range            Glucose (POC)  118 (H)  65 - 100 mg/dL            Performed by  Julieanne Manson              Imaging:   No results found.         Assessment and Plan:        AKI on CKD stage III   -Improving serum creatinine on IV fluid   -Nephrology following   -Monitor intake and output      Hypernatremia improving    -Currently on D5W   -Nephrology following      Right lower extremity ulcers   Appreciate ID:   Wound is chronic in R foot without surrounding cellulitis    Urine with Pseudomonas and ecoli   Suspect dehydration to play a large role in presentation and hypernatremia    Covering for possible UTI and organisms in wound cx but probably colonization    Recommend completing Cefepime for a 7 day course on 06/09/19   On flagyl to cover aspiration . Stop on 06/07/19.   wound cx with light pseudomonas and providencia      Urinary tract infection   -Urine culture with Pseudomonas and E. Coli   -Continue with current antibiotics      GI bleed   -Patient had coffee-ground emesis on admission   -GI evaluated the patient and recommending conservative management   -H&H stable, continue PPI      Covid pneumonia    -Chest x-ray with minimal right lower lobe infiltrate   -Patient is not a candidate for remdesivir or Decadron as patient not hypoxic   -Continue vitamin C and zinc   -On Flagyl to cover aspiration      Hypophos, replete      Discharge disposition: SNF, Likely 24-48h         Signed By:  Dominga Ferry, MD           June 07, 2019

## 2019-06-07 NOTE — Progress Notes (Signed)
Bedside and Verbal shift change report given to Rob Control and instrumentation engineer) by Tonie Griffith (offgoing nurse). Report included the following information SBAR, Kardex, MAR, Recent Results and Cardiac Rhythm NSR.

## 2019-06-07 NOTE — Progress Notes (Signed)
1425- Patient had a 18 beat run of v-tach asymptomatic physician notified.

## 2019-06-08 LAB — METABOLIC PANEL, BASIC
Anion gap: 7 mmol/L (ref 5–15)
BUN/Creatinine ratio: 15 (ref 12–20)
BUN: 22 MG/DL — ABNORMAL HIGH (ref 6–20)
CO2: 21 mmol/L (ref 21–32)
Calcium: 8 MG/DL — ABNORMAL LOW (ref 8.5–10.1)
Chloride: 115 mmol/L — ABNORMAL HIGH (ref 97–108)
Creatinine: 1.48 MG/DL — ABNORMAL HIGH (ref 0.70–1.30)
GFR est AA: 55 mL/min/{1.73_m2} — ABNORMAL LOW (ref >60)
GFR est non-AA: 46 mL/min/{1.73_m2} — ABNORMAL LOW (ref >60)
Glucose: 139 mg/dL — ABNORMAL HIGH (ref 65–100)
Potassium: 4 mmol/L (ref 3.5–5.1)
Sodium: 143 mmol/L (ref 136–145)

## 2019-06-08 LAB — GLUCOSE, POC
Glucose (POC): 197 mg/dL — ABNORMAL HIGH (ref 65–100)
Glucose (POC): 120 mg/dL — ABNORMAL HIGH (ref 65–100)
Glucose (POC): 181 mg/dL — ABNORMAL HIGH (ref 65–100)
Glucose (POC): 99 mg/dL (ref 65–100)

## 2019-06-08 LAB — PHOSPHORUS
Phosphorus: 1.8 MG/DL — ABNORMAL LOW (ref 2.6–4.7)
Phosphorus: 1.8 MG/DL — ABNORMAL LOW (ref 2.6–4.7)

## 2019-06-08 LAB — MAGNESIUM
Magnesium: 2.1 mg/dL (ref 1.6–2.4)
Magnesium: 2.1 mg/dL (ref 1.6–2.4)

## 2019-06-08 LAB — CK
CK: 122 U/L (ref 39–308)
Total CK: 122 U/L (ref 39–308)

## 2019-06-08 LAB — BASIC METABOLIC PANEL
Anion Gap: 7 mmol/L (ref 5–15)
BUN: 22 MG/DL — ABNORMAL HIGH (ref 6–20)
Bun/Cre Ratio: 15 (ref 12–20)
CO2: 21 mmol/L (ref 21–32)
Calcium: 8 MG/DL — ABNORMAL LOW (ref 8.5–10.1)
Chloride: 115 mmol/L — ABNORMAL HIGH (ref 97–108)
Creatinine: 1.48 MG/DL — ABNORMAL HIGH (ref 0.70–1.30)
EGFR IF NonAfrican American: 46 mL/min/{1.73_m2} — ABNORMAL LOW (ref >60)
GFR African American: 55 mL/min/{1.73_m2} — ABNORMAL LOW (ref >60)
Glucose: 139 mg/dL — ABNORMAL HIGH (ref 65–100)
Potassium: 4 mmol/L (ref 3.5–5.1)
Sodium: 143 mmol/L (ref 136–145)

## 2019-06-08 LAB — POCT GLUCOSE
POC Glucose: 197 mg/dL — ABNORMAL HIGH (ref 65–100)
POC Glucose: 120 mg/dL — ABNORMAL HIGH (ref 65–100)
POC Glucose: 181 mg/dL — ABNORMAL HIGH (ref 65–100)
POC Glucose: 99 mg/dL (ref 65–100)

## 2019-06-08 MED ORDER — SODIUM CHLORIDE 0.9 % IV
3 mmol/mL | Freq: Once | INTRAVENOUS | Status: AC
Start: 2019-06-08 — End: 2019-06-08
  Administered 2019-06-08: 16:00:00 via INTRAVENOUS

## 2019-06-08 MED ORDER — CEFEPIME 2 GRAM SOLUTION FOR INJECTION
2 gram | Freq: Two times a day (BID) | INTRAMUSCULAR | Status: AC
Start: 2019-06-08 — End: 2019-06-10
  Administered 2019-06-08 – 2019-06-10 (×4): via INTRAVENOUS

## 2019-06-08 MED ORDER — AMMONIUM LACTATE 12 % LOTION
12 % | Freq: Every day | CUTANEOUS | Status: DC
Start: 2019-06-08 — End: 2019-06-18
  Administered 2019-06-09 – 2019-06-18 (×10): via TOPICAL

## 2019-06-08 MED FILL — GABAPENTIN 300 MG CAP: 300 mg | ORAL | Qty: 1

## 2019-06-08 MED FILL — INSULIN LISPRO 100 UNIT/ML INJECTION: 100 unit/mL | SUBCUTANEOUS | Qty: 1

## 2019-06-08 MED FILL — CHOLECALCIFEROL (VITAMIN D3) 1,000 UNIT (25 MCG) TAB: ORAL | Qty: 2

## 2019-06-08 MED FILL — PHOS-NAK 280 MG-160 MG-250 MG ORAL POWDER PACKET: 280-160-250 mg | ORAL | Qty: 2

## 2019-06-08 MED FILL — PANTOPRAZOLE 40 MG TAB, DELAYED RELEASE: 40 mg | ORAL | Qty: 1

## 2019-06-08 MED FILL — AMMONIUM LACTATE 12 % LOTION: 12 % | CUTANEOUS | Qty: 227

## 2019-06-08 MED FILL — SODIUM PHOSPHATE 3 MMOLE/ML IV: 3 mmol/mL | INTRAVENOUS | Qty: 10

## 2019-06-08 MED FILL — SUCRALFATE 1 GRAM TAB: 1 gram | ORAL | Qty: 1

## 2019-06-08 MED FILL — ZINC-220 50 MG ZINC (220 MG) CAPSULE: 50 mg zinc (220 mg) | ORAL | Qty: 1

## 2019-06-08 MED FILL — ACETAMINOPHEN 325 MG TABLET: 325 mg | ORAL | Qty: 2

## 2019-06-08 MED FILL — DOK 100 MG CAPSULE: 100 mg | ORAL | Qty: 1

## 2019-06-08 MED FILL — VITAMIN C 500 MG TABLET: 500 mg | ORAL | Qty: 1

## 2019-06-08 MED FILL — CEFEPIME 2 GRAM SOLUTION FOR INJECTION: 2 gram | INTRAMUSCULAR | Qty: 2

## 2019-06-08 NOTE — Progress Notes (Signed)
Problem: Self Care Deficits Care Plan (Adult)  Goal: *Acute Goals and Plan of Care (Insert Text)  Description:   FUNCTIONAL STATUS PRIOR TO ADMISSION: Patient was modified independent using a cane for functional mobility, was modified independent for basic and instrumental ADLs however increased debility over the past couple of weeks requiring increased assist for ADLs/IADLs and mobility.     HOME SUPPORT: The patient lived with granddaughter and her significant other but did not require assist until recently.    Occupational Therapy Goals  Initiated 06/06/2019  1.  Patient will perform grooming at the sink with minimal assistance/contact guard assist within 7 day(s).  2.  Patient will perform bathing with minimal assistance/contact guard assist within 7 day(s).  3.  Patient will perform lower body dressing with minimal assistance/contact guard assist within 7 day(s).  4.  Patient will perform toilet transfers with minimal assistance/contact guard assist within 7 day(s).  5.  Patient will perform all aspects of toileting with minimal assistance/contact guard assist within 7 day(s).  6.  Patient will participate in upper extremity therapeutic exercise/activities with supervision/set-up for 5 minutes within 7 day(s).    7.  Patient will utilize energy conservation techniques during functional activities with verbal and visual cues within 7 day(s).            Outcome: Progressing Towards Goal     OCCUPATIONAL THERAPY TREATMENT  Patient: Michael Lozano (85 y.o. male)  Date: 06/08/2019  Diagnosis: AKI (acute kidney injury) (HCC) [N17.9]  Hypernatremia [E87.0]  GI bleed [K92.2] <principal problem not specified>       Precautions: PWB(RLE in off loading shoe)  Chart, occupational therapy assessment, plan of care, and goals were reviewed.    ASSESSMENT  Patient continues with skilled OT services and is progressing towards goals however remains limited by decreased balance, activity tolerance, cognition, distal reach, safety  awareness, strength, balance, and coordination noted with EOB lower body dressing techniques this session. Patient with increased agitation this session requiring mod encouragement to participation. Educated on purpose and donning of R post-op shoe with Min A and min cues for coordination/technique. Continue to recommend d/c to IPR to maximize safety & functional independence as he is unsafe to return home.     Current Level of Function Impacting Discharge (ADLs): Min-Mod A for ADLs & brief mobility with RW    Other factors to consider for discharge: fall risk, R foot PWB, PLOF, cognition, PMH         PLAN :  Patient continues to benefit from skilled intervention to address the above impairments.  Continue treatment per established plan of care to address goals.    Recommend with staff: Recommend with nursing, ADLs with assist PRN, OOB to chair 3x/day via rolling walker & R post-op shoe and toileting via beside commode. Thank you for completing as able in order to maintain patient strength, endurance and independence.     Recommendation for discharge: (in order for the patient to meet his/her long term goals)  Therapy up to 5 days/week in SNF setting    This discharge recommendation:  Has been made in collaboration with the attending provider and/or case management    IF patient discharges home will need the following DME: To be determined (TBD)         SUBJECTIVE:   Patient stated ???I want some food! I need to eat, they don't feed me??? stated multiple times     OBJECTIVE DATA SUMMARY:   Cognitive/Behavioral Status:  Neurologic State:   Alert  Orientation Level: Oriented X4  Cognition: Decreased attention/concentration;Follows commands;Impaired decision making;Poor safety awareness;Impulsive;Memory loss     Perseveration: Perseverates during conversation("I need food")  Safety/Judgement: Decreased awareness of environment;Decreased awareness of need for assistance;Decreased insight into deficits;Decreased awareness of  need for safety    Functional Mobility and Transfers for ADLs:  Bed Mobility:  Supine to Sit: Contact guard assistance  Scooting: Stand-by assistance    Transfers:  Sit to Stand: Minimum assistance;Assist x2  Bed to Chair: Minimum assistance(with rolling walker)    Balance:  Sitting: Intact  Standing: Impaired  Standing - Static: Fair  Standing - Dynamic : Fair    ADL Intervention:  Feeding  Feeding Assistance: Set-up(seated EOB)  Food to Mouth: Independent  Drink to Mouth: Independent    Lower Body Dressing Assistance  Dressing Assistance: Minimum assistance  Shoes with Velcro: Minimum assistance(R post-op shoe)  Leg Crossed Method Used: Yes  Position Performed: Seated edge of bed  Cues: Physical assistance;Tactile cues provided;Verbal cues provided;Visual cues provided    Cognitive Retraining  Safety/Judgement: Decreased awareness of environment;Decreased awareness of need for assistance;Decreased insight into deficits;Decreased awareness of need for safety    Pain:  LLE pain reported    Activity Tolerance:   Fair    After treatment patient left in no apparent distress:   Supine in bed, Call bell within reach, and Side rails x 3    COMMUNICATION/COLLABORATION:   The patient???s plan of care was discussed with: Physical therapy assistant and Registered nurse.     Daine Floras, OTD, OTR/L  Time Calculation: 19 mins

## 2019-06-08 NOTE — Progress Notes (Signed)
Problem: Mobility Impaired (Adult and Pediatric)  Goal: *Acute Goals and Plan of Care (Insert Text)  Description: FUNCTIONAL STATUS PRIOR TO ADMISSION: Patient was modified independent using a single point cane for functional mobility.    HOME SUPPORT PRIOR TO ADMISSION: The patient lived with granddaughter and family but did not require assist until recently.     Physical Therapy Goals  Initiated 06/06/2019  1.  Patient will move from supine to sit and sit to supine , scoot up and down, and roll side to side in bed with modified independence within 7 day(s).    2.  Patient will transfer from bed to chair and chair to bed with supervision/set-up using the least restrictive device within 7 day(s).  3.  Patient will perform sit to stand with supervision/set-up within 7 day(s).  4.  Patient will ambulate with minimal assistance/contact guard assist for 150 feet with the least restrictive device within 7 day(s).   5.  Patient will ascend/descend 2 stairs with one handrail(s) with minimal assistance/contact guard assist within 7 day(s).      Outcome: Progressing Towards Goal    PHYSICAL THERAPY TREATMENT  Patient: Michael Lozano (85 y.o. male)  Date: 06/08/2019  Diagnosis: AKI (acute kidney injury) (HCC) [N17.9]  Hypernatremia [E87.0]  GI bleed [K92.2] <principal problem not specified>       Precautions: PWB(RLE in off loading shoe)  Chart, physical therapy assessment, plan of care and goals were reviewed.    ASSESSMENT  Patient continues with skilled PT services and is progressing towards goals. Pt was agreeable to OOB with encouragement. Pt reports LE feeling weak. Pt was able to stand and pivot to the chair with rolling walker.      Current Level of Function Impacting Discharge (mobility/balance): Min A x2    Other factors to consider for discharge: decrease mobility and independence          PLAN :  Patient continues to benefit from skilled intervention to address the above impairments.  Continue treatment per  established plan of care.  to address goals.    Recommendation for discharge: (in order for the patient to meet his/her long term goals)  Therapy up to 5 days/week in SNF setting    This discharge recommendation:  Has not yet been discussed the attending provider and/or case management    IF patient discharges home will need the following DME: to be determined (TBD)       SUBJECTIVE:   Patient stated ???My legs are weak.???    OBJECTIVE DATA SUMMARY:   Critical Behavior:  Neurologic State: Alert  Orientation Level: Oriented X4  Cognition: Decreased attention/concentration, Poor safety awareness     Functional Mobility Training:  Bed Mobility:     Supine to Sit: Stand-by assistance              Transfers:  Sit to Stand: Minimum assistance;Assist x2  Stand to Sit: Moderate assistance        Bed to Chair: Minimum assistance(with rolling walker)                    Balance:  Sitting: Intact  Standing: Impaired  Standing - Static: Fair  Standing - Dynamic : Fair  Ambulation/Gait Training:  Stairs:              Therapeutic Exercises:     Pain Rating:  Did not limit    Activity Tolerance:   requires frequent rest breaks    After treatment patient left in no apparent distress:   Sitting in chair and Call bell within reach    COMMUNICATION/COLLABORATION:   The patient???s plan of care was discussed with: Registered nurse.     Denym Rahimi C Isadore Bokhari, PTA   Time Calculation: 16 mins

## 2019-06-08 NOTE — Progress Notes (Signed)
Renal Dosing/Monitoring  Medication: cefepime   Current regimen:  2 grams every 24 hr  Recent Labs     06/08/19  0657 06/07/19  0110 06/06/19  0303   CREA 1.48* 2.02* 2.32*   BUN 22* 29* 36*     Estimated CrCl:  40 ml/min  Plan: Change to 2 grams Q12 hr for CrCl 30-59 mL/min.

## 2019-06-08 NOTE — Progress Notes (Addendum)
NAME: Michael Lozano        DOB:  September 25, 1934        MRN:  [id number]         ??              Assessment   :                                               Plan:  AKI on CKD stage 3 (creatinine~ 1.7 at baseline?)  Rhabdomyolysis  Severe Hypernatremia  Covid  Hypotension  Metabolic acidosis  Hypercalcemia  UTI Creatinine showing improvement (peak 7.7mg /dl); Down to 1.4mg /dl today. Non-oliguric;  prerenal +/- ATN    Na improving     Decrease D5W to 50cc/hr    Neutra-Phos supplementation  ??  CK 1862 to 1372    ??COVID treatment->ID on board    Holding Ramipril and statin for now    Will sign off. Call us back if needed.         Subjective:     Chief Complaint:  Chart reviewed remotely (to limit spread of covid and conserve ppe).    Review of Systems:    Symptom Y/N Comments  Symptom Y/N Comments   Fever/Chills    Chest Pain     Poor Appetite    Edema     Cough    Abdominal Pain     Sputum    Joint Pain     SOB/DOE    Pruritis/Rash     Nausea/vomit    Tolerating PT/OT     Diarrhea    Tolerating Diet     Constipation    Other       Could not obtain due to:      Objective:     VITALS:   Last 24hrs VS reviewed since prior progress note. Most recent are:  Visit Vitals  BP 125/74 (BP 1 Location: Right upper arm, BP Patient Position: Sitting)   Pulse 69   Temp 98.1 ??F (36.7 ??C)   Resp 16   Ht 5\' 10"  (1.778 m)   Wt 81.2 kg (179 lb)   SpO2 95%   BMI 25.68 kg/m??       Intake/Output Summary (Last 24 hours) at 06/08/2019 1724  Last data filed at 06/08/2019 1340  Gross per 24 hour   Intake 899.56 ml   Output ???   Net 899.56 ml      Telemetry Reviewed:     PHYSICAL EXAM:  Deferred      Lab Data Reviewed: (see below)    Medications Reviewed: (see below)    PMH/SH reviewed - no change compared to H&P  ________________________________________________________________________  Care Plan discussed with:  Patient     Family      RN     Care Manager                     Consultant:          Comments   >50% of visit spent in counseling and coordination of care       ________________________________________________________________________  Stefano Trulson 06/10/2019, MD     Procedures: see electronic medical records for all procedures/Xrays and details which  were not copied into this note but were reviewed prior to creation of Plan.      LABS:  Recent Labs  06/07/19  0110 06/06/19  0303   WBC 6.9 6.9   HGB 10.1* 10.2*   HCT 31.5* 31.8*   PLT 205 197     Recent Labs     06/08/19  0657 06/07/19  0110 06/06/19  0303   NA 143 144 143   K 4.0 4.1 3.9   CL 115* 115* 114*   CO2 21 20* 22   BUN 22* 29* 36*   CREA 1.48* 2.02* 2.32*   GLU 139* 78 134*   CA 8.0* 7.8* 8.0*   MG 2.1 2.2 2.3   PHOS 1.8* 1.3* 2.1*     No results for input(s): AP, TBIL, TP, ALB, GLOB, GGT, AML, LPSE in the last 72 hours.    No lab exists for component: SGOT, GPT, AMYP, HLPSE  No results for input(s): INR, PTP, APTT, INREXT, INREXT in the last 72 hours.   No results for input(s): FE, TIBC, PSAT, FERR in the last 72 hours.   No results found for: FOL, RBCF   No results for input(s): PH, PCO2, PO2 in the last 72 hours.  Recent Labs     06/08/19  0657   CPK 122     No components found for: [facility]  Lab Results   Component Value Date/Time    Color DARK YELLOW 06/02/2019 01:00 PM    Appearance CLOUDY (A) 06/02/2019 01:00 PM    Specific gravity 1.023 06/02/2019 01:00 PM    pH (UA) 5.0 06/02/2019 01:00 PM    Protein Negative 06/02/2019 01:00 PM    Glucose Negative 06/02/2019 01:00 PM    Ketone TRACE (A) 06/02/2019 01:00 PM    Bilirubin NEGATIVE  08/09/2011 11:25 PM    Urobilinogen 0.2 06/02/2019 01:00 PM    Nitrites Positive (A) 06/02/2019 01:00 PM    Leukocyte Esterase SMALL (A) 06/02/2019 01:00 PM    Epithelial cells FEW 06/02/2019 01:00 PM    Bacteria Negative 06/02/2019 01:00 PM    WBC 0-4 06/02/2019 01:00 PM    RBC 0-5 06/02/2019 01:00 PM       MEDICATIONS:  Current Facility-Administered Medications   Medication Dose Route  Frequency   ??? cefepime (MAXIPIME) 2 g in 0.9% sodium chloride (MBP/ADV) 100 mL MBP  2 g IntraVENous Q12H   ??? sodium phosphate 30 mmol in 0.9% sodium chloride 250 mL infusion   IntraVENous ONCE   ??? [START ON 06/09/2019] ammonium lactate (LAC-HYDRIN) 12 % lotion   Topical DAILY   ??? albumin human 25% (BUMINATE) solution 12.5 g  12.5 g IntraVENous Q8H PRN   ??? insulin lispro (HUMALOG) injection   SubCUTAneous AC&HS   ??? gabapentin (NEURONTIN) capsule 300 mg  300 mg Oral BID   ??? zinc sulfate (ZINCATE) 50 mg zinc (220 mg) capsule 1 Cap  1 Cap Oral DAILY   ??? ascorbic acid (vitamin C) (VITAMIN C) tablet 500 mg  500 mg Oral BID   ??? pantoprazole (PROTONIX) tablet 40 mg  40 mg Oral BID   ??? cholecalciferol (VITAMIN D3) (1000 Units /25 mcg) tablet 2,000 Units  2,000 Units Oral DAILY   ??? sucralfate (CARAFATE) tablet 1 g  1 g Oral AC&HS   ??? docusate sodium (COLACE) capsule 100 mg  100 mg Oral DAILY   ??? HYDROmorphone (PF) (DILAUDID) injection 0.5 mg  0.5 mg IntraVENous Q4H PRN   ??? dextrose 5% infusion  100 mL/hr IntraVENous CONTINUOUS   ??? albuterol (PROVENTIL HFA, VENTOLIN HFA, PROAIR HFA) inhaler 1 Puff  1 Puff Inhalation  Q4H PRN   ??? glucose chewable tablet 16 g  4 Tab Oral PRN   ??? dextrose (D50W) injection syrg 12.5-25 g  25-50 mL IntraVENous PRN   ??? glucagon (GLUCAGEN) injection 1 mg  1 mg IntraMUSCular PRN   ??? acetaminophen (TYLENOL) tablet 650 mg  650 mg Oral Q6H PRN    Or   ??? acetaminophen (TYLENOL) suppository 650 mg  650 mg Rectal Q6H PRN   ??? acetaminophen (TYLENOL) tablet 650 mg  650 mg Oral Q6H   ??? sodium chloride (NS) flush 5-40 mL  5-40 mL IntraVENous Q8H   ??? sodium chloride (NS) flush 5-40 mL  5-40 mL IntraVENous PRN   ??? polyethylene glycol (MIRALAX) packet 17 g  17 g Oral DAILY PRN   ??? promethazine (PHENERGAN) tablet 12.5 mg  12.5 mg Oral Q6H PRN    Or   ??? ondansetron (ZOFRAN) injection 4 mg  4 mg IntraVENous Q6H PRN

## 2019-06-08 NOTE — Progress Notes (Signed)
[facility]                                                                                          Hospitalist Progress Note  Pervis Hocking, MD  Answering service: [phone] OR [phone] from in house phone              Progress Note    Patient: Michael Lozano MRN: [id number]  SSN: [id number]    Date of Birth: 1935-02-07  Age: 85 y.o.  Sex: male      Admit Date: 06/02/2019    LOS: 6 days     Subjective:     Patient initially admitted to ICU due to renal failure, hyponatremia and hypotension.  Patient was transferred out of ICU 4/21 for hospitalist service to assume care.  Patient complaining of neuropathy in his hands and legs and does not think that he has ability to take care of himself at home.    Pt had 18 beat run of v-tach, asymptomatic. No acute overnight events. No pain. NAD    Objective:     Vitals:    06/07/19 1830 06/08/19 0000 06/08/19 0400 06/08/19 0700   BP: (!) 104/58 (!) 99/55 (!) 105/54 (!) 108/56   Pulse: 70 75 70 65   Resp: 19 16 17 12    Temp: 98 ??F (36.7 ??C) 97.6 ??F (36.4 ??C) 97.8 ??F (36.6 ??C) 98.1 ??F (36.7 ??C)   SpO2: 100% 99% 98% 100%   Weight:       Height:            Intake and Output:  Current Shift: No intake/output data recorded.  Last three shifts: 04/21 1901 - 04/23 0700  In: 390 [P.O.:390]  Out: 1785 [Urine:1785]    Physical Exam:   GENERAL: alert, cooperative, no distress  LUNG: clear to auscultation bilaterally  HEART: regular rate and rhythm  ABDOMEN: soft, non-tender  EXTREMITIES:  extremities normal, atraumatic, no cyanosis or edema  SKIN: no rash or abnormalities  NEUROLOGIC: nonfocal    Lab/Data Review:  All lab results for the last 24 hours reviewed.     Recent Results (from the past 24 hour(s))   GLUCOSE, POC    Collection Time: 06/07/19 12:19 PM   Result Value Ref Range    Glucose (POC) 183 (H) 65 - 100 mg/dL    Performed by Dorna Bloom    GLUCOSE, POC    Collection Time: 06/07/19 12:45 PM   Result Value Ref Range    Glucose (POC) 155  (H) 65 - 100 mg/dL    Performed by Fabio Pierce    GLUCOSE, POC    Collection Time: 06/07/19  4:52 PM   Result Value Ref Range    Glucose (POC) 96 65 - 100 mg/dL    Performed by Fabio Pierce    GLUCOSE, POC    Collection Time: 06/07/19  9:36 PM   Result Value Ref Range    Glucose (POC) 197 (H) 65 - 100 mg/dL    Performed by Loraine    Collection Time: 06/08/19  6:57  AM   Result Value Ref Range    Magnesium 2.1 1.6 - 2.4 mg/dL   PHOSPHORUS    Collection Time: 06/08/19  6:57 AM   Result Value Ref Range    Phosphorus 1.8 (L) 2.6 - 4.7 MG/DL   CK    Collection Time: 06/08/19  6:57 AM   Result Value Ref Range    CK 122 39 - 308 U/L   METABOLIC PANEL, BASIC    Collection Time: 06/08/19  6:57 AM   Result Value Ref Range    Sodium 143 136 - 145 mmol/L    Potassium 4.0 3.5 - 5.1 mmol/L    Chloride 115 (H) 97 - 108 mmol/L    CO2 21 21 - 32 mmol/L    Anion gap 7 5 - 15 mmol/L    Glucose 139 (H) 65 - 100 mg/dL    BUN 22 (H) 6 - 20 MG/DL    Creatinine 0.93 (H) 0.70 - 1.30 MG/DL    BUN/Creatinine ratio 15 12 - 20      GFR est AA 55 (L) >60 ml/min/1.23m2    GFR est non-AA 46 (L) >60 ml/min/1.15m2    Calcium 8.0 (L) 8.5 - 10.1 MG/DL   GLUCOSE, POC    Collection Time: 06/08/19  8:32 AM   Result Value Ref Range    Glucose (POC) 120 (H) 65 - 100 mg/dL    Performed by Franco Nones         Imaging:  No results found.     Assessment and Plan:     AKI on CKD stage III  -Improving serum creatinine on IV fluid  -Nephrology following  -Monitor intake and output  Holding ACEi and statin for now  CPK now wnl    Hypernatremia improving   -Currently on D5W  -Nephrology following    Right lower extremity ulcers  Appreciate ID: wound, appears chronic without any cellulitis  Wound cultures from 05/17/2019 + for Enterobacter , Providentia for which he is on Cefepime.  Cefepime can cause akinetic seizures and altered mental status in the elderly and continue to monitor.   tolerating well so far except for nausea  Antiemetics  per primary team   Stop cefepime 06/09/19, Stop flagyl today, ID signed off  Needs good wound care    Urinary tract infection  -Urine culture with Pseudomonas and E. Coli  -Continue with antibiotics as above    GI bleed  -Patient had coffee-ground emesis on admission  -GI evaluated the patient and recommending conservative management  -H&H stable, continue PPI    Covid pneumonia   -Chest x-ray with minimal right lower lobe infiltrate  -Patient is not a candidate for remdesivir or Decadron as patient not hypoxic  -Continue vitamin C and zinc  -Flagyl to cover aspiration, ends today  Supportive care  On RA    Hypophos, replete    Discharge disposition: plan SNF 4/24 after abx completed, CM to assist    Signed By: Dominga Ferry, MD     June 08, 2019

## 2019-06-08 NOTE — Wound Image (Signed)
WOCN Note:   ??  Follow up visit to reassess right plantar diabetic foot ulcer  ??  Chart shows:  Patient admitted on 06/02/19 with AKI, GI bleed, positive for Covid 19       Past Medical History:   Diagnosis Date   ??? Chronic pain ??   ?? diabetic neuropathy   ??? Diabetes mellitus type II, non insulin dependent (HCC) ??   ??? Other ill-defined conditions(799.89) ??   ?? Gout   ??? Thromboembolus (HCC) ??   ??  Wound Culture obtained on 06/04/19 which grew Pseudomonas Aeruginosa and Providencia Rettgeri  WBC = 6.9 on 06/07/19  Glucose = 139 on 06/08/19  HgbA1C = 6.2 on 06/03/19, seen by diabetic educator on 06/05/19    Xray to right foot on 4/17 shows no definite plain film evidence for osteomyelitis. CT sacn to right foot on 4/18 shows no definite evidence of acute osteomyelitis.    Assessment:   Patient has head covered with blanket, irritated at this time, does not  want to converse at this time  Reports no pain   Mobility: moderate assistance with repositioning.  Continence: Incontinent of urine and stool  Last Braden Score 16  Surface: Total care mattress  Diet: Regular with Ensure supplements    Bilateral heel,buttocks, and sacral skin intact and without erythema   Heels offloaded with pillows   ??  Lower Extremity Assessment:  ??  Pulses: right foot with palpable pedal and posterior tibial pulse +1  Cap refill: <3sec  Temperature: warm  Insensate     1. POA right plantar diabetic foot ulcer   Full thickness   1.7 x 1.6 x 0.8 cm  No bone palpated  Base is 60% smooth red tissue, 40% yellow slough   serosang exudate, moderate amount  no odor   unattached edges  Periwound with callus & without erythema  ??  Patient is being followed by podiatry  ??  Wound Recommendations:    ??  Cleanse wound to right plantar foot with  wound cleanser, apply lac hydrin lotion to foot and periwound, then pack  wound bed with honey sheet, cover with plain optifoam and kerlix daily. * If need more honey  sheet or optifoam, can be found on 5E    Lac Hydrin 12% lotion to bilateral feet daily, avoid applying in between toes    PI Prevention:  Turn/reposition approximately every 2 hours  Offload heels with pillows at all times in bed.  Sacral Foam dressing: lift to assess regularly; change as needed. Discontinue if incontinent.   Hydraguard Barrier Cream to buttocks and sacrum TID  and as needed with incontinence care   Keep HOB 30 degrees or less to decrease shearing and pressure unless medically contraindicated. If HOB is to be over 30 degrees, raise knees first then [facility] to prevent sliding   Minimize layers of linen/pads under patient to optimize support surface to one  sheet and one incontinence pad     Patient has offloading Darco shoe in room. Attempted to educate patient on shoe. Patient irritated at this time and is not interested in learning.     Discussed with MD, Dr. Katrinka Blazing and RN, Misty Stanley    Transition of Care: Plan to follow weekly and as needed while admitted to hospital.   ??  Jennye Boroughs BSN, RN, [facility], [facility]  Certified Wound and Ostomy Nurse  office [phone]  pager [phone] or call operator to page

## 2019-06-08 NOTE — Progress Notes (Signed)
Renal Dosing/Monitoring  Medication: cefepime   Current regimen:  2 grams every 24 hr  Recent Labs     06/08/19  0657 06/07/19  0110 06/06/19  0303   CREA 1.48* 2.02* 2.32*   BUN 22* 29* 36*     Estimated CrCl:  40 ml/min  Plan: Change to 2 grams Q12 hr for CrCl 30-59 mL/min.

## 2019-06-08 NOTE — Progress Notes (Signed)
Progress  Notes by Dominga Ferry, MD at 06/08/19 1016                Author: Dominga Ferry, MD  Service: Internal Medicine  Author Type: Physician       Filed: 06/08/19 1023  Date of Service: 06/08/19 1016  Status: Signed          Editor: Dominga Ferry, MD (Physician)                                    [facility]                                                                                              Hospitalist Progress Note   Dominga Ferry, MD   Answering service: [phone] OR [phone] from in house phone                     Progress Note          Patient: Michael Lozano  MRN: [id number]   SSN: [id number]          Date of Birth: Nov 02, 1934   Age: 85 y.o.   Sex: male         Admit Date: 06/02/2019     LOS: 6 days         Subjective:        Patient initially admitted to ICU due to renal failure, hyponatremia and hypotension.  Patient was transferred out of ICU 4/21 for hospitalist service to assume care.  Patient complaining of neuropathy in his hands and legs and does not think that he  has ability to take care of himself at home.      Pt had 18 beat run of v-tach, asymptomatic. No acute overnight events. No pain. NAD        Objective:          Vitals:             06/07/19 1830  06/08/19 0000  06/08/19 0400  06/08/19 0700           BP:  (!) 104/58  (!) 99/55  (!) 105/54  (!) 108/56     Pulse:  70  75  70  65     Resp:  19  16  17  12      Temp:  98 ??F (36.7 ??C)  97.6 ??F (36.4 ??C)  97.8 ??F (36.6 ??C)  98.1 ??F (36.7 ??C)     SpO2:  100%  99%  98%  100%     Weight:                   Height:                    Intake and Output:   Current Shift: No intake/output data recorded.   Last three shifts: 04/21 1901 - 04/23 0700   In: 390 [P.O.:390]   Out: 1785 [Urine:1785]  Physical Exam:    GENERAL: alert, cooperative, no distress   LUNG: clear to auscultation bilaterally   HEART: regular rate and rhythm   ABDOMEN: soft, non-tender   EXTREMITIES:  extremities normal, atraumatic,  no cyanosis or edema   SKIN: no rash or abnormalities   NEUROLOGIC: nonfocal      Lab/Data Review:   All lab results for the last 24 hours reviewed.         Recent Results (from the past 24 hour(s))     GLUCOSE, POC          Collection Time: 06/07/19 12:19 PM         Result  Value  Ref Range            Glucose (POC)  183 (H)  65 - 100 mg/dL       Performed by  Dorna Bloom         GLUCOSE, POC          Collection Time: 06/07/19 12:45 PM         Result  Value  Ref Range            Glucose (POC)  155 (H)  65 - 100 mg/dL       Performed by  Fabio Pierce         GLUCOSE, POC          Collection Time: 06/07/19  4:52 PM         Result  Value  Ref Range            Glucose (POC)  96  65 - 100 mg/dL       Performed by  Fabio Pierce         GLUCOSE, POC          Collection Time: 06/07/19  9:36 PM         Result  Value  Ref Range            Glucose (POC)  197 (H)  65 - 100 mg/dL       Performed by  Dolores Frame         MAGNESIUM          Collection Time: 06/08/19  6:57 AM         Result  Value  Ref Range            Magnesium  2.1  1.6 - 2.4 mg/dL       PHOSPHORUS          Collection Time: 06/08/19  6:57 AM         Result  Value  Ref Range            Phosphorus  1.8 (L)  2.6 - 4.7 MG/DL       CK          Collection Time: 06/08/19  6:57 AM         Result  Value  Ref Range            CK  122  39 - 308 U/L       METABOLIC PANEL, BASIC          Collection Time: 06/08/19  6:57 AM         Result  Value  Ref Range            Sodium  143  136 - 145 mmol/L       Potassium  4.0  3.5 - 5.1  mmol/L       Chloride  115 (H)  97 - 108 mmol/L       CO2  21  21 - 32 mmol/L       Anion gap  7  5 - 15 mmol/L       Glucose  139 (H)  65 - 100 mg/dL       BUN  22 (H)  6 - 20 MG/DL       Creatinine  9.32 (H)  0.70 - 1.30 MG/DL       BUN/Creatinine ratio  15  12 - 20         GFR est AA  55 (L)  >60 ml/min/1.48m2       GFR est non-AA  46 (L)  >60 ml/min/1.68m2       Calcium  8.0 (L)  8.5 - 10.1 MG/DL       GLUCOSE, POC          Collection Time:  06/08/19  8:32 AM         Result  Value  Ref Range            Glucose (POC)  120 (H)  65 - 100 mg/dL            Performed by  Franco Nones              Imaging:   No results found.         Assessment and Plan:        AKI on CKD stage III   -Improving serum creatinine on IV fluid   -Nephrology following   -Monitor intake and output   Holding ACEi and statin for now   CPK now wnl      Hypernatremia improving    -Currently on D5W   -Nephrology following      Right lower extremity ulcers   Appreciate ID: wound, appears chronic without any cellulitis   Wound cultures from 05/17/2019 + for Enterobacter , Providentia for which he is on Cefepime.  Cefepime can cause akinetic seizures and altered mental status in the elderly and continue to monitor.   tolerating well so far except for nausea   Antiemetics per primary team    Stop cefepime 06/09/19, Stop flagyl today, ID signed off   Needs good wound care      Urinary tract infection   -Urine culture with Pseudomonas and E. Coli   -Continue with antibiotics as above      GI bleed   -Patient had coffee-ground emesis on admission   -GI evaluated the patient and recommending conservative management   -H&H stable, continue PPI      Covid pneumonia    -Chest x-ray with minimal right lower lobe infiltrate   -Patient is not a candidate for remdesivir or Decadron as patient not hypoxic   -Continue vitamin C and zinc   -Flagyl to cover aspiration, ends today   Supportive care   On RA      Hypophos, replete      Discharge disposition: plan SNF 4/24 after abx completed, CM to assist         Signed By:  Dominga Ferry, MD           June 08, 2019

## 2019-06-08 NOTE — Progress Notes (Signed)
 Problem: Mobility Impaired (Adult and Pediatric)  Goal: *Acute Goals and Plan of Care (Insert Text)  Description: FUNCTIONAL STATUS PRIOR TO ADMISSION: Patient was modified independent using a single point cane for functional mobility.    HOME SUPPORT PRIOR TO ADMISSION: The patient lived with granddaughter and family but did not require assist until recently.     Physical Therapy Goals  Initiated 06/06/2019  1.  Patient will move from supine to sit and sit to supine , scoot up and down, and roll side to side in bed with modified independence within 7 day(s).    2.  Patient will transfer from bed to chair and chair to bed with supervision/set-up using the least restrictive device within 7 day(s).  3.  Patient will perform sit to stand with supervision/set-up within 7 day(s).  4.  Patient will ambulate with minimal assistance/contact guard assist for 150 feet with the least restrictive device within 7 day(s).   5.  Patient will ascend/descend 2 stairs with one handrail(s) with minimal assistance/contact guard assist within 7 day(s).      Outcome: Progressing Towards Goal    PHYSICAL THERAPY TREATMENT  Patient: Michael Lozano (85 y.o. male)  Date: 06/08/2019  Diagnosis: AKI (acute kidney injury) (HCC) [N17.9]  Hypernatremia [E87.0]  GI bleed [K92.2] <principal problem not specified>       Precautions: PWB(RLE in off loading shoe)  Chart, physical therapy assessment, plan of care and goals were reviewed.    ASSESSMENT  Patient continues with skilled PT services and is progressing towards goals. Pt was agreeable to OOB with encouragement. Pt reports LE feeling weak. Pt was able to stand and pivot to the chair with rolling walker.      Current Level of Function Impacting Discharge (mobility/balance): Min A x2    Other factors to consider for discharge: decrease mobility and independence          PLAN :  Patient continues to benefit from skilled intervention to address the above impairments.  Continue treatment per  established plan of care.  to address goals.    Recommendation for discharge: (in order for the patient to meet his/her long term goals)  Therapy up to 5 days/week in SNF setting    This discharge recommendation:  Has not yet been discussed the attending provider and/or case management    IF patient discharges home will need the following DME: to be determined (TBD)       SUBJECTIVE:   Patient stated "My legs are weak."    OBJECTIVE DATA SUMMARY:   Critical Behavior:  Neurologic State: Alert  Orientation Level: Oriented X4  Cognition: Decreased attention/concentration, Poor safety awareness     Functional Mobility Training:  Bed Mobility:     Supine to Sit: Stand-by assistance              Transfers:  Sit to Stand: Minimum assistance;Assist x2  Stand to Sit: Moderate assistance        Bed to Chair: Minimum assistance(with rolling walker)                    Balance:  Sitting: Intact  Standing: Impaired  Standing - Static: Fair  Standing - Dynamic : Fair  Ambulation/Gait Training:  Stairs:              Therapeutic Exercises:     Pain Rating:  Did not limit    Activity Tolerance:   requires frequent rest breaks    After treatment patient left in no apparent distress:   Sitting in chair and Call bell within reach    COMMUNICATION/COLLABORATION:   The patient's plan of care was discussed with: Registered nurse.     Lauraine JAYSON Eva, PTA   Time Calculation: 16 mins

## 2019-06-08 NOTE — Progress Notes (Signed)
 Problem: Self Care Deficits Care Plan (Adult)  Goal: *Acute Goals and Plan of Care (Insert Text)  Description:   FUNCTIONAL STATUS PRIOR TO ADMISSION: Patient was modified independent using a cane for functional mobility, was modified independent for basic and instrumental ADLs however increased debility over the past couple of weeks requiring increased assist for ADLs/IADLs and mobility.     HOME SUPPORT: The patient lived with granddaughter and her significant other but did not require assist until recently.    Occupational Therapy Goals  Initiated 06/06/2019  1.  Patient will perform grooming at the sink with minimal assistance/contact guard assist within 7 day(s).  2.  Patient will perform bathing with minimal assistance/contact guard assist within 7 day(s).  3.  Patient will perform lower body dressing with minimal assistance/contact guard assist within 7 day(s).  4.  Patient will perform toilet transfers with minimal assistance/contact guard assist within 7 day(s).  5.  Patient will perform all aspects of toileting with minimal assistance/contact guard assist within 7 day(s).  6.  Patient will participate in upper extremity therapeutic exercise/activities with supervision/set-up for 5 minutes within 7 day(s).    7.  Patient will utilize energy conservation techniques during functional activities with verbal and visual cues within 7 day(s).            Outcome: Progressing Towards Goal     OCCUPATIONAL THERAPY TREATMENT  Patient: Michael Lozano (85 y.o. male)  Date: 06/08/2019  Diagnosis: AKI (acute kidney injury) (HCC) [N17.9]  Hypernatremia [E87.0]  GI bleed [K92.2] <principal problem not specified>       Precautions: PWB(RLE in off loading shoe)  Chart, occupational therapy assessment, plan of care, and goals were reviewed.    ASSESSMENT  Patient continues with skilled OT services and is progressing towards goals however remains limited by decreased balance, activity tolerance, cognition, distal reach, safety  awareness, strength, balance, and coordination noted with EOB lower body dressing techniques this session. Patient with increased agitation this session requiring mod encouragement to participation. Educated on purpose and donning of R post-op shoe with Min A and min cues for coordination/technique. Continue to recommend d/c to IPR to maximize safety & functional independence as he is unsafe to return home.     Current Level of Function Impacting Discharge (ADLs): Min-Mod A for ADLs & brief mobility with RW    Other factors to consider for discharge: fall risk, R foot PWB, PLOF, cognition, PMH         PLAN :  Patient continues to benefit from skilled intervention to address the above impairments.  Continue treatment per established plan of care to address goals.    Recommend with staff: Recommend with nursing, ADLs with assist PRN, OOB to chair 3x/day via rolling walker & R post-op shoe and toileting via beside commode. Thank you for completing as able in order to maintain patient strength, endurance and independence.     Recommendation for discharge: (in order for the patient to meet his/her long term goals)  Therapy up to 5 days/week in SNF setting    This discharge recommendation:  Has been made in collaboration with the attending provider and/or case management    IF patient discharges home will need the following DME: To be determined (TBD)         SUBJECTIVE:   Patient stated "I want some food! I need to eat, they don't feed me" stated multiple times     OBJECTIVE DATA SUMMARY:   Cognitive/Behavioral Status:  Neurologic State:  Alert  Orientation Level: Oriented X4  Cognition: Decreased attention/concentration;Follows commands;Impaired decision making;Poor safety awareness;Impulsive;Memory loss     Perseveration: Perseverates during conversation(I need food)  Safety/Judgement: Decreased awareness of environment;Decreased awareness of need for assistance;Decreased insight into deficits;Decreased awareness of  need for safety    Functional Mobility and Transfers for ADLs:  Bed Mobility:  Supine to Sit: Contact guard assistance  Scooting: Stand-by assistance    Transfers:  Sit to Stand: Minimum assistance;Assist x2  Bed to Chair: Minimum assistance(with rolling walker)    Balance:  Sitting: Intact  Standing: Impaired  Standing - Static: Fair  Standing - Dynamic : Fair    ADL Intervention:  Feeding  Feeding Assistance: Set-up(seated EOB)  Food to Mouth: Independent  Drink to Mouth: Independent    Lower Body Dressing Assistance  Dressing Assistance: Minimum assistance  Shoes with Velcro: Minimum assistance(R post-op shoe)  Leg Crossed Method Used: Yes  Position Performed: Seated edge of bed  Cues: Physical assistance;Tactile cues provided;Verbal cues provided;Visual cues provided    Cognitive Retraining  Safety/Judgement: Decreased awareness of environment;Decreased awareness of need for assistance;Decreased insight into deficits;Decreased awareness of need for safety    Pain:  LLE pain reported    Activity Tolerance:   Fair    After treatment patient left in no apparent distress:   Supine in bed, Call bell within reach, and Side rails x 3    COMMUNICATION/COLLABORATION:   The patient's plan of care was discussed with: Physical therapy assistant and Registered nurse.     Vernell LOISE Ellen, OTD, OTR/L  Time Calculation: 19 mins

## 2019-06-08 NOTE — Progress Notes (Signed)
Progress  Notes by Talbert Forest, MD at 06/08/19 1724                Author: Talbert Forest, MD  Service: Nephrology  Author Type: Physician       Filed: 06/08/19 1726  Date of Service: 06/08/19 1724  Status: Addendum          Editor: Talbert Forest, MD (Physician)          Related Notes: Original Note by Talbert Forest, MD (Physician) filed at 06/08/19 1726                                                                                                 NAME: Michael Lozano         DOB:  02/01/1935         MRN:  [id number]            ??               Assessment   :                                                Plan:      AKI on CKD stage 3 (creatinine~ 1.7 at baseline?)   Rhabdomyolysis   Severe Hypernatremia   Covid   Hypotension   Metabolic acidosis   Hypercalcemia   UTI  Creatinine showing improvement (peak 7.7mg /dl); Down to 1.4mg /dl today. Non-oliguric;  prerenal +/- ATN      Na improving       Decrease D5W to 50cc/hr      Neutra-Phos supplementation   ??   CK 1862 to 1372      ??COVID treatment->ID on board      Holding Ramipril and statin for now      Will sign off. Call us back if needed.                Subjective:        Chief Complaint:  Chart reviewed remotely (to limit spread of covid and conserve ppe).      Review of Systems:              Symptom  Y/N  Comments    Symptom  Y/N  Comments             Fever/Chills        Chest Pain                 Poor Appetite        Edema                 Cough        Abdominal Pain         Sputum        Joint Pain         SOB/DOE        Pruritis/Rash         Nausea/vomit  Tolerating PT/OT         Diarrhea        Tolerating Diet                 Constipation        Other               Could not obtain due to:            Objective:        VITALS:    Last 24hrs VS reviewed since prior progress note. Most recent are:   Visit Vitals      BP  125/74 (BP 1 Location: Right upper arm, BP Patient Position: Sitting)     Pulse  69     Temp  98.1 ??F (36.7 ??C)     Resp  16     Ht   5\' 10"  (1.778 m)     Wt  81.2 kg (179 lb)     SpO2  95%        BMI  25.68 kg/m??           Intake/Output Summary (Last 24 hours) at 06/08/2019 1724   Last data filed at 06/08/2019 1340     Gross per 24 hour        Intake  899.56 ml        Output  --        Net  899.56 ml         Telemetry Reviewed:       PHYSICAL EXAM:   Deferred         Lab Data Reviewed: (see below)      Medications Reviewed: (see below)      PMH/SH reviewed - no change compared to H&P   ________________________________________________________________________   Care Plan discussed with:       Patient             Family              RN             Care Manager                            Consultant:                     Comments         >50% of visit spent in counseling and coordination of care            ________________________________________________________________________   Samanyu Tinnell Sherrye Payor, MD       Procedures: see electronic medical records for all procedures/Xrays and details which   were not copied into this note but were reviewed prior to creation of Plan.        LABS:     Recent Labs            06/07/19   0110  06/06/19   0303     WBC  6.9  6.9     HGB  10.1*  10.2*     HCT  31.5*  31.8*         PLT  205  197          Recent Labs             06/08/19   0657  06/07/19   0110  06/06/19   0303     NA  143  144  143     K  4.0  4.1  3.9     CL  115*  115*  114*     CO2  21  20*  22     BUN  22*  29*  36*     CREA  1.48*  2.02*  2.32*     GLU  139*  78  134*     CA  8.0*  7.8*  8.0*     MG  2.1  2.2  2.3          PHOS  1.8*  1.3*  2.1*        No results for input(s): AP, TBIL, TP, ALB, GLOB, GGT, AML, LPSE in the last 72 hours.      No lab exists for component: SGOT, GPT, AMYP, HLPSE   No results for input(s): INR, PTP, APTT, INREXT, INREXT in the last 72 hours.    No results for input(s): FE, TIBC, PSAT, FERR in the last 72 hours.    No results found for: FOL, RBCF    No results for input(s): PH, PCO2, PO2 in the last 72 hours.     Recent Labs            06/08/19   0657        CPK  122        No components found for: [facility]     Lab Results         Component  Value  Date/Time            Color  DARK YELLOW  06/02/2019 01:00 PM       Appearance  CLOUDY (A)  06/02/2019 01:00 PM       Specific gravity  1.023  06/02/2019 01:00 PM       pH (UA)  5.0  06/02/2019 01:00 PM       Protein  Negative  06/02/2019 01:00 PM       Glucose  Negative  06/02/2019 01:00 PM       Ketone  TRACE (A)  06/02/2019 01:00 PM       Bilirubin  NEGATIVE   08/09/2011 11:25 PM       Urobilinogen  0.2  06/02/2019 01:00 PM       Nitrites  Positive (A)  06/02/2019 01:00 PM       Leukocyte Esterase  SMALL (A)  06/02/2019 01:00 PM       Epithelial cells  FEW  06/02/2019 01:00 PM       Bacteria  Negative  06/02/2019 01:00 PM       WBC  0-4  06/02/2019 01:00 PM            RBC  0-5  06/02/2019 01:00 PM           MEDICATIONS:     Current Facility-Administered Medications          Medication  Dose  Route  Frequency           ?  cefepime (MAXIPIME) 2 g in 0.9% sodium chloride (MBP/ADV) 100 mL MBP   2 g  IntraVENous  Q12H     ?  sodium phosphate 30 mmol in 0.9% sodium chloride 250 mL infusion     IntraVENous  ONCE     ?  [START ON 06/09/2019] ammonium lactate (LAC-HYDRIN) 12 % lotion     Topical  DAILY     ?  albumin human 25% (BUMINATE) solution 12.5  g   12.5 g  IntraVENous  Q8H PRN     ?  insulin lispro (HUMALOG) injection     SubCUTAneous  AC&HS     ?  gabapentin (NEURONTIN) capsule 300 mg   300 mg  Oral  BID     ?  zinc sulfate (ZINCATE) 50 mg zinc (220 mg) capsule 1 Cap   1 Cap  Oral  DAILY     ?  ascorbic acid (vitamin C) (VITAMIN C) tablet 500 mg   500 mg  Oral  BID     ?  pantoprazole (PROTONIX) tablet 40 mg   40 mg  Oral  BID     ?  cholecalciferol (VITAMIN D3) (1000 Units /25 mcg) tablet 2,000 Units   2,000 Units  Oral  DAILY     ?  sucralfate (CARAFATE) tablet 1 g   1 g  Oral  AC&HS     ?  docusate sodium (COLACE) capsule 100 mg   100 mg  Oral  DAILY     ?  HYDROmorphone (PF) (DILAUDID) injection 0.5  mg   0.5 mg  IntraVENous  Q4H PRN     ?  dextrose 5% infusion   100 mL/hr  IntraVENous  CONTINUOUS           ?  albuterol (PROVENTIL HFA, VENTOLIN HFA, PROAIR HFA) inhaler 1 Puff   1 Puff  Inhalation  Q4H PRN           ?  glucose chewable tablet 16 g   4 Tab  Oral  PRN     ?  dextrose (D50W) injection syrg 12.5-25 g   25-50 mL  IntraVENous  PRN     ?  glucagon (GLUCAGEN) injection 1 mg   1 mg  IntraMUSCular  PRN     ?  acetaminophen (TYLENOL) tablet 650 mg   650 mg  Oral  Q6H PRN          Or           ?  acetaminophen (TYLENOL) suppository 650 mg   650 mg  Rectal  Q6H PRN     ?  acetaminophen (TYLENOL) tablet 650 mg   650 mg  Oral  Q6H     ?  sodium chloride (NS) flush 5-40 mL   5-40 mL  IntraVENous  Q8H     ?  sodium chloride (NS) flush 5-40 mL   5-40 mL  IntraVENous  PRN     ?  polyethylene glycol (MIRALAX) packet 17 g   17 g  Oral  DAILY PRN     ?  promethazine (PHENERGAN) tablet 12.5 mg   12.5 mg  Oral  Q6H PRN          Or           ?  ondansetron (ZOFRAN) injection 4 mg   4 mg  IntraVENous  Q6H PRN

## 2019-06-09 LAB — CBC W/O DIFF
ABSOLUTE NRBC: 0 10*3/uL (ref 0.00–0.01)
HCT: 29.1 % — ABNORMAL LOW (ref 36.6–50.3)
HGB: 9.2 g/dL — ABNORMAL LOW (ref 12.1–17.0)
MCH: 28 PG (ref 26.0–34.0)
MCHC: 31.6 g/dL (ref 30.0–36.5)
MCV: 88.7 FL (ref 80.0–99.0)
MPV: 12.5 FL (ref 8.9–12.9)
NRBC: 0 PER 100 WBC
PLATELET: 145 10*3/uL — ABNORMAL LOW (ref 150–400)
RBC: 3.28 M/uL — ABNORMAL LOW (ref 4.10–5.70)
RDW: 17.3 % — ABNORMAL HIGH (ref 11.5–14.5)
WBC: 7.4 10*3/uL (ref 4.1–11.1)

## 2019-06-09 LAB — GLUCOSE, POC
Glucose (POC): 118 mg/dL — ABNORMAL HIGH (ref 65–100)
Glucose (POC): 126 mg/dL — ABNORMAL HIGH (ref 65–100)
Glucose (POC): 141 mg/dL — ABNORMAL HIGH (ref 65–100)
Glucose (POC): 172 mg/dL — ABNORMAL HIGH (ref 65–100)

## 2019-06-09 LAB — METABOLIC PANEL, BASIC
Anion gap: 8 mmol/L (ref 5–15)
BUN/Creatinine ratio: 14 (ref 12–20)
BUN: 18 MG/DL (ref 6–20)
CO2: 18 mmol/L — ABNORMAL LOW (ref 21–32)
Calcium: 8.3 MG/DL — ABNORMAL LOW (ref 8.5–10.1)
Chloride: 115 mmol/L — ABNORMAL HIGH (ref 97–108)
Creatinine: 1.33 MG/DL — ABNORMAL HIGH (ref 0.70–1.30)
GFR est AA: >60 mL/min/{1.73_m2} (ref >60)
GFR est non-AA: 51 mL/min/{1.73_m2} — ABNORMAL LOW (ref >60)
Glucose: 155 mg/dL — ABNORMAL HIGH (ref 65–100)
Potassium: 4.3 mmol/L (ref 3.5–5.1)
Sodium: 141 mmol/L (ref 136–145)

## 2019-06-09 LAB — PHOSPHORUS
Phosphorus: 2 MG/DL — ABNORMAL LOW (ref 2.6–4.7)
Phosphorus: 2 MG/DL — ABNORMAL LOW (ref 2.6–4.7)

## 2019-06-09 LAB — LACTIC ACID
Lactic Acid: 2 MMOL/L (ref 0.4–2.0)
Lactic acid: 2 MMOL/L (ref 0.4–2.0)

## 2019-06-09 LAB — MAGNESIUM
Magnesium: 1.9 mg/dL (ref 1.6–2.4)
Magnesium: 1.9 mg/dL (ref 1.6–2.4)

## 2019-06-09 LAB — POCT GLUCOSE
POC Glucose: 118 mg/dL — ABNORMAL HIGH (ref 65–100)
POC Glucose: 126 mg/dL — ABNORMAL HIGH (ref 65–100)
POC Glucose: 141 mg/dL — ABNORMAL HIGH (ref 65–100)
POC Glucose: 172 mg/dL — ABNORMAL HIGH (ref 65–100)

## 2019-06-09 LAB — BASIC METABOLIC PANEL
Anion Gap: 8 mmol/L (ref 5–15)
BUN: 18 MG/DL (ref 6–20)
Bun/Cre Ratio: 14 (ref 12–20)
CO2: 18 mmol/L — ABNORMAL LOW (ref 21–32)
Calcium: 8.3 MG/DL — ABNORMAL LOW (ref 8.5–10.1)
Chloride: 115 mmol/L — ABNORMAL HIGH (ref 97–108)
Creatinine: 1.33 MG/DL — ABNORMAL HIGH (ref 0.70–1.30)
EGFR IF NonAfrican American: 51 mL/min/{1.73_m2} — ABNORMAL LOW (ref >60)
GFR African American: >60 mL/min/{1.73_m2} (ref >60)
Glucose: 155 mg/dL — ABNORMAL HIGH (ref 65–100)
Potassium: 4.3 mmol/L (ref 3.5–5.1)
Sodium: 141 mmol/L (ref 136–145)

## 2019-06-09 LAB — CBC
Hematocrit: 29.1 % — ABNORMAL LOW (ref 36.6–50.3)
Hemoglobin: 9.2 g/dL — ABNORMAL LOW (ref 12.1–17.0)
MCH: 28 PG (ref 26.0–34.0)
MCHC: 31.6 g/dL (ref 30.0–36.5)
MCV: 88.7 FL (ref 80.0–99.0)
MPV: 12.5 FL (ref 8.9–12.9)
NRBC Absolute: 0 10*3/uL (ref 0.00–0.01)
Nucleated RBCs: 0 PER 100 WBC
Platelets: 145 10*3/uL — ABNORMAL LOW (ref 150–400)
RBC: 3.28 M/uL — ABNORMAL LOW (ref 4.10–5.70)
RDW: 17.3 % — ABNORMAL HIGH (ref 11.5–14.5)
WBC: 7.4 10*3/uL (ref 4.1–11.1)

## 2019-06-09 MED FILL — ACETAMINOPHEN 325 MG TABLET: 325 mg | ORAL | Qty: 2

## 2019-06-09 MED FILL — GABAPENTIN 300 MG CAP: 300 mg | ORAL | Qty: 1

## 2019-06-09 MED FILL — SUCRALFATE 1 GRAM TAB: 1 gram | ORAL | Qty: 1

## 2019-06-09 MED FILL — ZINC-220 50 MG ZINC (220 MG) CAPSULE: 50 mg zinc (220 mg) | ORAL | Qty: 1

## 2019-06-09 MED FILL — PANTOPRAZOLE 40 MG TAB, DELAYED RELEASE: 40 mg | ORAL | Qty: 1

## 2019-06-09 MED FILL — CEFEPIME 2 GRAM SOLUTION FOR INJECTION: 2 gram | INTRAMUSCULAR | Qty: 2

## 2019-06-09 MED FILL — CHOLECALCIFEROL (VITAMIN D3) 1,000 UNIT (25 MCG) TAB: ORAL | Qty: 2

## 2019-06-09 MED FILL — VITAMIN C 500 MG TABLET: 500 mg | ORAL | Qty: 1

## 2019-06-09 MED FILL — VENTOLIN HFA 90 MCG/ACTUATION AEROSOL INHALER: 90 mcg/actuation | RESPIRATORY_TRACT | Qty: 8

## 2019-06-09 MED FILL — DOK 100 MG CAPSULE: 100 mg | ORAL | Qty: 1

## 2019-06-09 MED FILL — INSULIN LISPRO 100 UNIT/ML INJECTION: 100 unit/mL | SUBCUTANEOUS | Qty: 1

## 2019-06-09 NOTE — Progress Notes (Signed)
TRANSFER - OUT REPORT:    Verbal report given to Jennifer (name) on Michael Lozano  being transferred to 6E(unit) for routine progression of care       Report consisted of patient???s Situation, Background, Assessment and   Recommendations(SBAR).     Information from the following report(s) SBAR, Kardex, MAR, Recent Results and Cardiac Rhythm V-Paced was reviewed with the receiving nurse.    Lines:   Peripheral IV 06/06/19 Left Hand (Active)   Site Assessment Clean, dry, & intact 06/09/19 0854   Phlebitis Assessment 0 06/09/19 0854   Infiltration Assessment 0 06/09/19 0854   Dressing Status Clean, dry, & intact 06/09/19 0854   Dressing Type Transparent 06/09/19 0854   Hub Color/Line Status Blue;Infusing 06/09/19 0854   Action Taken Open ports on tubing capped 06/09/19 0854   Alcohol Cap Used Yes 06/09/19 0854        Opportunity for questions and clarification was provided.      Patient transported with:   Tech

## 2019-06-09 NOTE — Progress Notes (Signed)
Problem: Falls - Risk of  Goal: *Absence of Falls  Description: Document Schmid Fall Risk and appropriate interventions in the flowsheet.  Outcome: Progressing Towards Goal  Note: Fall Risk Interventions:  Mobility Interventions: Patient to call before getting OOB    Mentation Interventions: Bed/chair exit alarm, Adequate sleep, hydration, pain control    Medication Interventions: Bed/chair exit alarm, Evaluate medications/consider consulting pharmacy, Patient to call before getting OOB    Elimination Interventions: Call light in reach, Patient to call for help with toileting needs, Stay With Me (per policy)              Problem: Patient Education: Go to Patient Education Activity  Goal: Patient/Family Education  Outcome: Progressing Towards Goal     Problem: Pressure Injury - Risk of  Goal: *Prevention of pressure injury  Description: Document Braden Scale and appropriate interventions in the flowsheet.  Outcome: Progressing Towards Goal  Note: Pressure Injury Interventions:  Sensory Interventions: Assess need for specialty bed, Check visual cues for pain, Keep linens dry and wrinkle-free, Maintain/enhance activity level, Minimize linen layers    Moisture Interventions: Absorbent underpads, Check for incontinence Q2 hours and as needed, Minimize layers    Activity Interventions: Increase time out of bed    Mobility Interventions: Pressure redistribution bed/mattress (bed type)    Nutrition Interventions: Document food/fluid/supplement intake    Friction and Shear Interventions: Lift sheet, Minimize layers                Problem: Patient Education: Go to Patient Education Activity  Goal: Patient/Family Education  Outcome: Progressing Towards Goal     Problem: Discharge Planning  Goal: *Discharge to safe environment  Outcome: Progressing Towards Goal     Problem: Patient Education: Go to Patient Education Activity  Goal: Patient/Family Education  Outcome: Progressing Towards Goal     Problem: Patient Education: Go  to Patient Education Activity  Goal: Patient/Family Education  Outcome: Progressing Towards Goal

## 2019-06-09 NOTE — Progress Notes (Signed)
Progress Note    Patient: Michael Lozano MRN: [id number]  SSN: [id number]    Date of Birth: [birth year]  Age: 85 y.o.  Sex: male      Admit Date: 06/02/2019    LOS: 7 days            Assessment/plan:   1.  Generalized weakness/deconditioning.  Case management has been consulted for rehab placement.  2.  Recent COVID-19 virus infection.  Patient remains in contact and droplet isolation.   3.  Diabetes mellitus.  Currently just on sliding scale insulin.  4.  Acute kidney injury present on admission is now resolved.  Creatinine appears to be close to baseline.  5.  Diabetic neuropathy.  Continue gabapentin.      Current Facility-Administered Medications:   ???  ammonium lactate (LAC-HYDRIN) 12 % lotion, , Topical, DAILY, Dominga Ferry, MD, Given at 06/09/19 1213  ???  albumin human 25% (BUMINATE) solution 12.5 g, 12.5 g, IntraVENous, Q8H PRN, Allena Katz, Deep V, MD  ???  insulin lispro (HUMALOG) injection, , SubCUTAneous, AC&HS, Jeanell Sparrow, NP-C, Stopped at 06/09/19 2200  ???  gabapentin (NEURONTIN) capsule 300 mg, 300 mg, Oral, BID, Burnice Logan R, NP-C, 300 mg at 06/09/19 1700  ???  zinc sulfate (ZINCATE) 50 mg zinc (220 mg) capsule 1 Cap, 1 Cap, Oral, DAILY, Burnice Logan R, NP-C, 1 Cap at 06/09/19 7169  ???  pantoprazole (PROTONIX) tablet 40 mg, 40 mg, Oral, BID, Burnice Logan R, NP-C, 40 mg at 06/09/19 1712  ???  cholecalciferol (VITAMIN D3) (1000 Units /25 mcg) tablet 2,000 Units, 2,000 Units, Oral, DAILY, Burnice Logan R, NP-C, 2,000 Units at 06/09/19 0854  ???  sucralfate (CARAFATE) tablet 1 g, 1 g, Oral, AC&HS, Burnice Logan R, NP-C, 1 g at 06/09/19 2339  ???  docusate sodium (COLACE) capsule 100 mg, 100 mg, Oral, DAILY, Burnice Logan R, NP-C, 100 mg at 06/09/19 0854  ???  HYDROmorphone (PF) (DILAUDID) injection 0.5 mg, 0.5 mg, IntraVENous, Q4H PRN, Nila Nephew, NP, 0.5 mg at 06/05/19 0835  ???  dextrose 5% infusion, 50 mL/hr, IntraVENous, CONTINUOUS, Patel, Deep V, MD, Last Rate: 50 mL/hr at 06/09/19 2341,  50 mL/hr at 06/09/19 2341  ???  albuterol (PROVENTIL HFA, VENTOLIN HFA, PROAIR HFA) inhaler 1 Puff, 1 Puff, Inhalation, Q4H PRN, Tonette Bihari, NP  ???  glucose chewable tablet 16 g, 4 Tab, Oral, PRN, Tonette Bihari, NP  ???  dextrose (D50W) injection syrg 12.5-25 g, 25-50 mL, IntraVENous, PRN, Tonette Bihari, NP  ???  glucagon (GLUCAGEN) injection 1 mg, 1 mg, IntraMUSCular, PRN, Tonette Bihari, NP  ???  acetaminophen (TYLENOL) tablet 650 mg, 650 mg, Oral, Q6H PRN, 650 mg at 06/07/19 2030 **OR** acetaminophen (TYLENOL) suppository 650 mg, 650 mg, Rectal, Q6H PRN, Nila Nephew, NP  ???  acetaminophen (TYLENOL) tablet 650 mg, 650 mg, Oral, Q6H, Sinnatamby, Diane S, MD, 650 mg at 06/09/19 2339  ???  sodium chloride (NS) flush 5-40 mL, 5-40 mL, IntraVENous, Q8H, Lockard, Sara H, NP, 10 mL at 06/09/19 2340  ???  sodium chloride (NS) flush 5-40 mL, 5-40 mL, IntraVENous, PRN, Lockard, Kasandra Knudsen, NP  ???  polyethylene glycol (MIRALAX) packet 17 g, 17 g, Oral, DAILY PRN, Lockard, Kasandra Knudsen, NP  ???  promethazine (PHENERGAN) tablet 12.5 mg, 12.5 mg, Oral, Q6H PRN **OR** ondansetron (ZOFRAN) injection 4 mg, 4 mg, IntraVENous, Q6H PRN, Nila Nephew, NP, 4 mg at 06/05/19 1320    Interval summary:  Patient  was admitted to the intensive care unit on 06/02/2019 after presenting to the emergency department with acute kidney injury, hypernatremia.  Patient had been recently diagnosed with COVID-19 on 05/18/2019, and tested positive again on 06/02/2019.  Per emergency department records, P "He has had a gradual decline, very poor p.o. intake to the point that he has not been eating or drinking for several days. ??He also is somnolent and less interactive. ??He has not fallen or had recurrent fevers".  Patient was also hypothermic and hypotensive at admission.  There was concern for sepsis, and he was started on IV fluids, and initial antibiotics IV cefepime and IV vancomycin.  Mild coffee-ground emesis was noted via nasogastric tube and gastroenterologist was  consulted.  They recommended supportive care with either PPI twice daily or Pepcid twice daily, and felt that EGD was not indicated.  He has been followed by infectious disease specialist.  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  However, cultures on 05/17/2019 from the wound was positive for light E.cloacae complex, Providencia rettgeri sensitive to cefepime.   ID recommended Zyvox instead of vancomycin due to the potential contribution to nephrotoxicity.  IV Flagyl was added for concern for aspiration, and continued until 06/08/2019.  It was documented by ID on 06/06/2019 that  organisms in wound culture were probably colonization. urine cultures came back positive for  Pseudomonas and ecoli. Infectious disease specialist has been following throughout, and commended to continue IV cefepime for 1 week, stopping on the night of 06/09/2019.    Subjective:     Patient has no complaints at this time, except for feeling weak.  He was living at home with family, and in this home, there are stairs which states that he certainly cannot manage at this time.  Patient states that he usually ambulates without any assistive devices.  Patient is interested in short-term inpatient rehab possibly skilled nursing facility long-term.  Patient states that he sometimes has pain in the right foot, but it is much improved.  Patient denies any pain anywhere else, nausea, vomiting, shortness of breath.  Patient has right plantar ulcer which is clean-based on exam.  Patient has not had any fever per review of vital signs for the last 48 hours.    Objective:     Vitals:    06/09/19 0707 06/09/19 1113 06/09/19 1304 06/09/19 1442   BP: (!) 107/52 125/63 119/69 111/61   Pulse: 60 67 69 61   Resp: 15 18 16 17    Temp: 98 ??F (36.7 ??C) 97.8 ??F (36.6 ??C) 98.2 ??F (36.8 ??C) 98.6 ??F (37 ??C)   SpO2: 98% 97% 98% 96%   Oxygen therapy  room air  room air  room air   room air        Intake and Output:  Current Shift: No intake/output data recorded.  Last three shifts: 04/22 1901 - 04/24 0700  In: 899.6 [P.O.:480; I.V.:419.6]  Out: -     Physical Exam:   Estimated body mass index is 23.66 kg/m?? as calculated from the following:    Height as of this encounter: 5\' 10"  (1.778 m).    Weight as of this encounter: 74.8 kg (164 lb 14.4 oz).   General: In no acute distress.  Well developed, well nourished.  Head: Normocephalic, atraumatic.  Eyes: Anicteric sclera.  PERRL.  Extraocular muscles intact.  ENT: External ears and nose appear normal.  Oral  mucosa moist.  Neck: Supple.  No jugular venous distention.  Heart: Regular rate and rhythm.  No murmurs appreciated.  Chest: Symmetrical excursion.  Clear to auscultation bilaterally.  Abdomen: Soft, nontender.  No abnormal distention. Bowel sounds are present throughout.  Extremities: No gross deformities.  No particular edema, no cyanosis.  Feet are warm to touch.  Neurological: Being all extremities spontaneously with at least antigravity strength.  Alert, oriented X3.  Skin: No jaundice.  Patient has a clean-based quarter sized ulcer about 1 cm deep on the plantar aspect of his right foot at the location of the second metatarsal.      Lab/Data Review:  Recent Results (from the past 24 hour(s))   LACTIC ACID    Collection Time: 06/09/19 12:45 AM   Result Value Ref Range    Lactic acid 2.0 0.4 - 2.0 MMOL/L   MAGNESIUM    Collection Time: 06/09/19 12:45 AM   Result Value Ref Range    Magnesium 1.9 1.6 - 2.4 mg/dL   PHOSPHORUS    Collection Time: 06/09/19 12:45 AM   Result Value Ref Range    Phosphorus 2.0 (L) 2.6 - 4.7 MG/DL   METABOLIC PANEL, BASIC    Collection Time: 06/09/19 12:45 AM   Result Value Ref Range    Sodium 141 136 - 145 mmol/L    Potassium 4.3 3.5 - 5.1 mmol/L    Chloride 115 (H) 97 - 108 mmol/L    CO2 18 (L) 21 - 32 mmol/L    Anion gap 8 5 - 15 mmol/L    Glucose 155 (H) 65 - 100 mg/dL    BUN 18 6 - 20 MG/DL    Creatinine  2.82 (H) 0.70 - 1.30 MG/DL    BUN/Creatinine ratio 14 12 - 20      GFR est AA >60 >60 ml/min/1.61m2    GFR est non-AA 51 (L) >60 ml/min/1.14m2    Calcium 8.3 (L) 8.5 - 10.1 MG/DL   CBC W/O DIFF    Collection Time: 06/09/19 12:45 AM   Result Value Ref Range    WBC 7.4 4.1 - 11.1 K/uL    RBC 3.28 (L) 4.10 - 5.70 M/uL    HGB 9.2 (L) 12.1 - 17.0 g/dL    HCT 06.0 (L) 15.6 - 50.3 %    MCV 88.7 80.0 - 99.0 FL    MCH 28.0 26.0 - 34.0 PG    MCHC 31.6 30.0 - 36.5 g/dL    RDW 15.3 (H) 79.4 - 14.5 %    PLATELET 145 (L) 150 - 400 K/uL    MPV 12.5 8.9 - 12.9 FL    NRBC 0.0 0 PER 100 WBC    ABSOLUTE NRBC 0.00 0.00 - 0.01 K/uL   GLUCOSE, POC    Collection Time: 06/09/19  8:11 AM   Result Value Ref Range    Glucose (POC) 126 (H) 65 - 100 mg/dL    Performed by Carmon Ginsberg.    GLUCOSE, POC    Collection Time: 06/09/19 11:14 AM   Result Value Ref Range    Glucose (POC) 172 (H) 65 - 100 mg/dL    Performed by Carmon Ginsberg.    GLUCOSE, POC    Collection Time: 06/09/19  4:15 PM   Result Value Ref Range    Glucose (POC) 141 (H) 65 - 100 mg/dL    Performed by [place]          Signed By: Vladimir Creeks,  DO     June 09, 2019

## 2019-06-09 NOTE — Progress Notes (Signed)
Problem: Falls - Risk of  Goal: *Absence of Falls  Description: Document Schmid Fall Risk and appropriate interventions in the flowsheet.  Outcome: Progressing Towards Goal  Note: Fall Risk Interventions:  Mobility Interventions: Patient to call before getting OOB    Mentation Interventions: Door open when patient unattended    Medication Interventions: Assess postural VS orthostatic hypotension    Elimination Interventions: Call light in reach, Patient to call for help with toileting needs              Problem: Patient Education: Go to Patient Education Activity  Goal: Patient/Family Education  Outcome: Progressing Towards Goal     Problem: Pressure Injury - Risk of  Goal: *Prevention of pressure injury  Description: Document Braden Scale and appropriate interventions in the flowsheet.  Outcome: Progressing Towards Goal  Note: Pressure Injury Interventions:  Sensory Interventions: Assess need for specialty bed, Check visual cues for pain, Keep linens dry and wrinkle-free, Maintain/enhance activity level, Minimize linen layers    Moisture Interventions: Absorbent underpads, Check for incontinence Q2 hours and as needed, Minimize layers    Activity Interventions: Increase time out of bed, Pressure redistribution bed/mattress(bed type)    Mobility Interventions: Pressure redistribution bed/mattress (bed type)    Nutrition Interventions: Document food/fluid/supplement intake    Friction and Shear Interventions: Lift sheet, Minimize layers                Problem: Patient Education: Go to Patient Education Activity  Goal: Patient/Family Education  Outcome: Progressing Towards Goal     Problem: Discharge Planning  Goal: *Discharge to safe environment  Outcome: Progressing Towards Goal     Problem: Patient Education: Go to Patient Education Activity  Goal: Patient/Family Education  Outcome: Progressing Towards Goal     Problem: Patient Education: Go to Patient Education Activity  Goal: Patient/Family Education  Outcome:  Progressing Towards Goal

## 2019-06-09 NOTE — Progress Notes (Signed)
Progress  Notes by Vladimir CreeksKhan, Emylee Decelle H, DO at 06/09/19 1614                Author: Vladimir CreeksKhan, Chaos Carlile H, DO  Service: Internal Medicine  Author Type: Physician       Filed: 06/10/19 0128  Date of Service: 06/09/19 1614  Status: Signed          Editor: Vladimir CreeksKhan, Bron Snellings H, DO (Physician)                               Progress Note          Patient: Michael Lozano  MRN: [id number]   SSN: [id number]          Date of Birth: 21-Sep-1934   Age: 85 y.o.   Sex: male         Admit Date: 06/02/2019     LOS: 7 days                   Assessment/plan :     1.  Generalized weakness/deconditioning.  Case management has been consulted for rehab placement.   2.  Recent COVID-19 virus infection.  Patient remains in contact and droplet isolation.    3.  Diabetes mellitus.  Currently just on sliding scale insulin.   4.  Acute kidney injury present on admission is now resolved.  Creatinine appears to be close to baseline.   5.  Diabetic neuropathy.  Continue gabapentin.         Current Facility-Administered Medications:    ?  ammonium lactate (LAC-HYDRIN) 12 % lotion, , Topical, DAILY, Dominga FerrySmith, Cara W, MD, Given at 06/09/19 1213   ?  albumin human 25% (BUMINATE) solution 12.5 g, 12.5 g, IntraVENous, Q8H PRN, Patel, Deep V, MD   ?  insulin lispro (HUMALOG) injection, , SubCUTAneous, AC&HS, Burnice LoganMatthew, Dellyn R, NP-C, Stopped at 06/09/19 2200   ?  gabapentin (NEURONTIN) capsule 300 mg, 300 mg, Oral, BID, Burnice LoganMatthew, Dellyn R, NP-C, 300 mg at 06/09/19 1700   ?  zinc sulfate (ZINCATE) 50 mg zinc (220 mg) capsule 1 Cap, 1 Cap, Oral, DAILY, Burnice LoganMatthew, Dellyn R, NP-C, 1 Cap at 06/09/19 [id number]   ?  pantoprazole (PROTONIX) tablet 40 mg, 40 mg, Oral, BID, Burnice LoganMatthew, Dellyn R, NP-C, 40 mg at 06/09/19 1712   ?  cholecalciferol (VITAMIN D3) (1000 Units /25 mcg) tablet 2,000 Units, 2,000 Units, Oral, DAILY, Burnice LoganMatthew, Dellyn R, NP-C, 2,000 Units at 06/09/19 [id number]   ?  sucralfate (CARAFATE) tablet 1 g, 1 g, Oral, AC&HS, Burnice LoganMatthew, Dellyn R, NP-C, 1 g at 06/09/19 2339   ?  docusate  sodium (COLACE) capsule 100 mg, 100 mg, Oral, DAILY, Burnice LoganMatthew, Dellyn R, NP-C, 100 mg at 06/09/19 0854   ?  HYDROmorphone (PF) (DILAUDID) injection 0.5 mg, 0.5 mg, IntraVENous, Q4H PRN, Nila NephewLockard, Sara H, NP, 0.5 mg at 06/05/19 0835   ?  dextrose 5% infusion, 50 mL/hr, IntraVENous, CONTINUOUS, Patel, Deep V, MD, Last Rate: 50 mL/hr at 06/09/19 2341, 50 mL/hr at 06/09/19 2341   ?  albuterol (PROVENTIL HFA, VENTOLIN HFA, PROAIR HFA) inhaler 1 Puff, 1 Puff, Inhalation, Q4H PRN, Tonette BihariGill, James M, NP   ?  glucose chewable tablet 16 g, 4 Tab, Oral, PRN, Tonette BihariGill, James M, NP   ?  dextrose (D50W) injection syrg 12.5-25 g, 25-50 mL, IntraVENous, PRN, Tonette BihariGill, James M, NP   ?  glucagon (GLUCAGEN) injection 1 mg, 1 mg, IntraMUSCular, PRN, Delane GingerGill,  Alyson Locket, NP   ?  acetaminophen (TYLENOL) tablet 650 mg, 650 mg, Oral, Q6H PRN, 650 mg at 06/07/19 2030 **OR** acetaminophen (TYLENOL) suppository 650 mg, 650 mg, Rectal, Q6H PRN, Arta Silence, NP   ?  acetaminophen (TYLENOL) tablet 650 mg, 650 mg, Oral, Q6H, Sinnatamby, Marylu Lund, MD, 650 mg at 06/09/19 2339   ?  sodium chloride (NS) flush 5-40 mL, 5-40 mL, IntraVENous, Q8H, Lockard, Sara H, NP, 10 mL at 06/09/19 2340   ?  sodium chloride (NS) flush 5-40 mL, 5-40 mL, IntraVENous, PRN, Lockard, Remi Deter, NP   ?  polyethylene glycol (MIRALAX) packet 17 g, 17 g, Oral, DAILY PRN, Lockard, Remi Deter, NP   ?  promethazine (PHENERGAN) tablet 12.5 mg, 12.5 mg, Oral, Q6H PRN **OR** ondansetron (ZOFRAN) injection 4 mg, 4 mg, IntraVENous, Q6H  PRN, Lockard, Remi Deter, NP, 4 mg at 06/05/19 1320      Interval summary:   Patient was admitted to the intensive care unit on 06/02/2019 after presenting to the emergency department with acute kidney injury, hypernatremia.  Patient had been recently diagnosed with COVID-19  on 05/18/2019, and tested positive again on 06/02/2019.  Per emergency department records, P "He has had a gradual decline, very poor p.o. intake to the point that he has not been eating or drinking for  several days. ??He also is somnolent and less interactive.  ??He has not fallen or had recurrent fevers".  Patient was also hypothermic and hypotensive at admission.  There was concern for sepsis,  and he was started on IV fluids, and initial antibiotics IV cefepime and IV vancomycin.  Mild coffee-ground emesis was noted via nasogastric tube and gastroenterologist was consulted.  They recommended supportive care with either PPI twice daily or Pepcid  twice daily, and felt that EGD was not indicated.  He has been followed by infectious disease specialist.  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  However,  cultures on 05/17/2019 from the wound was positive for light E.cloacae complex, Providencia rettgeri sensitive to cefepime.    ID recommended Zyvox instead of vancomycin due to the potential contribution to nephrotoxicity.  IV Flagyl was added for concern for aspiration, and continued until 06/08/2019.  It was documented by  ID on 06/06/2019 that  organisms in wound culture were probably colonization. urine cultures came back positive for  Pseudomonas and ecoli. Infectious disease specialist has been following throughout, and commended to continue IV cefepime for 1 week, stopping  on the night of 06/09/2019.        Subjective:        Patient has no complaints at this time, except for feeling weak.  He was living at home with family, and in this home, there are stairs which states that he certainly cannot manage at this time.  Patient  states that he usually ambulates without any assistive devices.  Patient is interested in short-term inpatient rehab possibly skilled nursing facility long-term.  Patient states that he sometimes has pain in the right foot, but it is much improved.  Patient  denies any pain anywhere else, nausea, vomiting, shortness of breath.  Patient has right plantar ulcer which is clean-based on exam.  Patient  has not had any fever per review of vital signs for the last 48 hours.        Objective:          Vitals:  06/09/19 0707  06/09/19 1113  06/09/19 1304  06/09/19 1442           BP:  (!) 107/52  125/63  119/69  111/61     Pulse:  60  67  69  61     Resp:  15  18  16  17      Temp:  98 ??F (36.7 ??C)  97.8 ??F (36.6 ??C)  98.2 ??F (36.8 ??C)  98.6 ??F (37 ??C)     SpO2:  98%  97%  98%  96%     Oxygen therapy   room air   room air   room air   room air            Intake and Output:   Current Shift: No intake/output data recorded.   Last three shifts: 04/22 1901 - 04/24 0700   In: 899.6 [P.O.:480; I.V.:419.6]   Out: -       Physical Exam:    Estimated body mass index is 23.66 kg/m?? as calculated from the following:     Height as of this encounter: 5\' 10"  (1.778 m).     Weight as of this encounter: 74.8 kg (164 lb 14.4 oz).    General: In no acute distress.  Well developed, well nourished.  Head: Normocephalic, atraumatic.  Eyes: Anicteric sclera.  PERRL.  Extraocular muscles intact.  ENT: External ears and nose appear normal.  Oral mucosa moist.  Neck: Supple.   No jugular venous distention.   Heart: Regular rate and rhythm.  No murmurs appreciated.  Chest: Symmetrical excursion.  Clear to auscultation bilaterally.  Abdomen: Soft, nontender.  No abnormal distention. Bowel sounds are present throughout.  Extremities: No gross deformities.   No particular edema, no cyanosis.  Feet are warm to touch.  Neurological: Being all extremities spontaneously with at least antigravity strength.  Alert, oriented X3.  Skin: No jaundice.  Patient has a clean-based quarter sized ulcer about 1  cm deep on the plantar aspect of his right foot at the location of the second metatarsal.         Lab/Data Review:     Recent Results (from the past 24 hour(s))     LACTIC ACID          Collection Time: 06/09/19 12:45 AM         Result  Value  Ref Range            Lactic acid  2.0  0.4 - 2.0 MMOL/L       MAGNESIUM          Collection Time:  06/09/19 12:45 AM         Result  Value  Ref Range            Magnesium  1.9  1.6 - 2.4 mg/dL       PHOSPHORUS          Collection Time: 06/09/19 12:45 AM         Result  Value  Ref Range            Phosphorus  2.0 (L)  2.6 - 4.7 MG/DL       METABOLIC PANEL, BASIC          Collection Time: 06/09/19 12:45 AM         Result  Value  Ref Range            Sodium  141  136 - 145 mmol/L  Potassium  4.3  3.5 - 5.1 mmol/L       Chloride  115 (H)  97 - 108 mmol/L       CO2  18 (L)  21 - 32 mmol/L       Anion gap  8  5 - 15 mmol/L       Glucose  155 (H)  65 - 100 mg/dL       BUN  18  6 - 20 MG/DL       Creatinine  3.53 (H)  0.70 - 1.30 MG/DL       BUN/Creatinine ratio  14  12 - 20         GFR est AA  >60  >60 ml/min/1.64m2       GFR est non-AA  51 (L)  >60 ml/min/1.33m2       Calcium  8.3 (L)  8.5 - 10.1 MG/DL       CBC W/O DIFF          Collection Time: 06/09/19 12:45 AM         Result  Value  Ref Range            WBC  7.4  4.1 - 11.1 K/uL       RBC  3.28 (L)  4.10 - 5.70 M/uL       HGB  9.2 (L)  12.1 - 17.0 g/dL       HCT  29.9 (L)  24.2 - 50.3 %       MCV  88.7  80.0 - 99.0 FL       MCH  28.0  26.0 - 34.0 PG       MCHC  31.6  30.0 - 36.5 g/dL       RDW  68.3 (H)  41.9 - 14.5 %       PLATELET  145 (L)  150 - 400 K/uL       MPV  12.5  8.9 - 12.9 FL       NRBC  0.0  0 PER 100 WBC       ABSOLUTE NRBC  0.00  0.00 - 0.01 K/uL       GLUCOSE, POC          Collection Time: 06/09/19  8:11 AM         Result  Value  Ref Range            Glucose (POC)  126 (H)  65 - 100 mg/dL       Performed by  Carmon Ginsberg.         GLUCOSE, POC          Collection Time: 06/09/19 11:14 AM         Result  Value  Ref Range            Glucose (POC)  172 (H)  65 - 100 mg/dL       Performed by  Carmon Ginsberg.         GLUCOSE, POC          Collection Time: 06/09/19  4:15 PM         Result  Value  Ref Range            Glucose (POC)  141 (H)  65 - 100 mg/dL            Performed by  [place]  Signed By:  Vladimir Creeks, DO            June 09, 2019

## 2019-06-09 NOTE — Progress Notes (Signed)
 TRANSFER - OUT REPORT:    Verbal report given to [name] (name) on Michael Lozano  being transferred to 6E(unit) for routine progression of care       Report consisted of patient's Situation, Background, Assessment and   Recommendations(SBAR).     Information from the following report(s) SBAR, Kardex, [facility], Recent Results and Cardiac Rhythm V-Paced was reviewed with the receiving nurse.    Lines:   Peripheral IV 06/06/19 Left Hand (Active)   Site Assessment Clean, dry, & intact 06/09/19 0854   Phlebitis Assessment 0 06/09/19 0854   Infiltration Assessment 0 06/09/19 0854   Dressing Status Clean, dry, & intact 06/09/19 0854   Dressing Type Transparent 06/09/19 0854   Hub Color/Line Status Blue;Infusing 06/09/19 0854   Action Taken Open ports on tubing capped 06/09/19 0854   Alcohol Cap Used Yes 06/09/19 0854        Opportunity for questions and clarification was provided.      Patient transported with:   [employer]

## 2019-06-09 NOTE — Progress Notes (Signed)
Problem: Falls - Risk of  Goal: *Absence of Falls  Description: Document Bridgette Habermann Fall Risk and appropriate interventions in the flowsheet.  Outcome: Progressing Towards Goal  Note: Fall Risk Interventions:  Mobility Interventions: Patient to call before getting OOB    Mentation Interventions: Bed/chair exit alarm, Adequate sleep, hydration, pain control    Medication Interventions: Bed/chair exit alarm, Evaluate medications/consider consulting pharmacy, Patient to call before getting OOB    Elimination Interventions: Call light in reach, Patient to call for help with toileting needs, Stay With Me (per policy)              Problem: Patient Education: Go to Patient Education Activity  Goal: Patient/Family Education  Outcome: Progressing Towards Goal     Problem: Pressure Injury - Risk of  Goal: *Prevention of pressure injury  Description: Document Braden Scale and appropriate interventions in the flowsheet.  Outcome: Progressing Towards Goal  Note: Pressure Injury Interventions:  Sensory Interventions: Assess need for specialty bed, Check visual cues for pain, Keep linens dry and wrinkle-free, Maintain/enhance activity level, Minimize linen layers    Moisture Interventions: Absorbent underpads, Check for incontinence Q2 hours and as needed, Minimize layers    Activity Interventions: Increase time out of bed    Mobility Interventions: Pressure redistribution bed/mattress (bed type)    Nutrition Interventions: Document food/fluid/supplement intake    Friction and Shear Interventions: Lift sheet, Minimize layers                Problem: Patient Education: Go to Patient Education Activity  Goal: Patient/Family Education  Outcome: Progressing Towards Goal     Problem: Discharge Planning  Goal: *Discharge to safe environment  Outcome: Progressing Towards Goal     Problem: Patient Education: Go to Patient Education Activity  Goal: Patient/Family Education  Outcome: Progressing Towards Goal     Problem: Patient Education: Go  to Patient Education Activity  Goal: Patient/Family Education  Outcome: Progressing Towards Goal

## 2019-06-09 NOTE — Progress Notes (Signed)
Problem: Falls - Risk of  Goal: *Absence of Falls  Description: Document Michael Lozano Fall Risk and appropriate interventions in the flowsheet.  Outcome: Progressing Towards Goal  Note: Fall Risk Interventions:  Mobility Interventions: Patient to call before getting OOB    Mentation Interventions: Door open when patient unattended    Medication Interventions: Assess postural VS orthostatic hypotension    Elimination Interventions: Call light in reach, Patient to call for help with toileting needs              Problem: Patient Education: Go to Patient Education Activity  Goal: Patient/Family Education  Outcome: Progressing Towards Goal     Problem: Pressure Injury - Risk of  Goal: *Prevention of pressure injury  Description: Document Braden Scale and appropriate interventions in the flowsheet.  Outcome: Progressing Towards Goal  Note: Pressure Injury Interventions:  Sensory Interventions: Assess need for specialty bed, Check visual cues for pain, Keep linens dry and wrinkle-free, Maintain/enhance activity level, Minimize linen layers    Moisture Interventions: Absorbent underpads, Check for incontinence Q2 hours and as needed, Minimize layers    Activity Interventions: Increase time out of bed, Pressure redistribution bed/mattress(bed type)    Mobility Interventions: Pressure redistribution bed/mattress (bed type)    Nutrition Interventions: Document food/fluid/supplement intake    Friction and Shear Interventions: Lift sheet, Minimize layers                Problem: Patient Education: Go to Patient Education Activity  Goal: Patient/Family Education  Outcome: Progressing Towards Goal     Problem: Discharge Planning  Goal: *Discharge to safe environment  Outcome: Progressing Towards Goal     Problem: Patient Education: Go to Patient Education Activity  Goal: Patient/Family Education  Outcome: Progressing Towards Goal     Problem: Patient Education: Go to Patient Education Activity  Goal: Patient/Family Education  Outcome:  Progressing Towards Goal

## 2019-06-10 LAB — GLUCOSE, POC
Glucose (POC): 94 mg/dL (ref 65–100)
Glucose (POC): 113 mg/dL — ABNORMAL HIGH (ref 65–100)
Glucose (POC): 122 mg/dL — ABNORMAL HIGH (ref 65–100)
Glucose (POC): 520 mg/dL — ABNORMAL HIGH (ref 65–100)

## 2019-06-10 LAB — POCT GLUCOSE
POC Glucose: 94 mg/dL (ref 65–100)
POC Glucose: 113 mg/dL — ABNORMAL HIGH (ref 65–100)
POC Glucose: 122 mg/dL — ABNORMAL HIGH (ref 65–100)
POC Glucose: 520 mg/dL — ABNORMAL HIGH (ref 65–100)

## 2019-06-10 MED ORDER — INSULIN LISPRO 100 UNIT/ML INJECTION
100 unit/mL | Freq: Once | SUBCUTANEOUS | Status: AC
Start: 2019-06-10 — End: 2019-06-10
  Administered 2019-06-10: 21:00:00 via SUBCUTANEOUS

## 2019-06-10 MED FILL — GABAPENTIN 300 MG CAP: 300 mg | ORAL | Qty: 1

## 2019-06-10 MED FILL — SUCRALFATE 1 GRAM TAB: 1 gram | ORAL | Qty: 1

## 2019-06-10 MED FILL — INSULIN LISPRO 100 UNIT/ML INJECTION: 100 unit/mL | SUBCUTANEOUS | Qty: 1

## 2019-06-10 MED FILL — CHOLECALCIFEROL (VITAMIN D3) 1,000 UNIT (25 MCG) TAB: ORAL | Qty: 2

## 2019-06-10 MED FILL — ACETAMINOPHEN 325 MG TABLET: 325 mg | ORAL | Qty: 2

## 2019-06-10 MED FILL — PANTOPRAZOLE 40 MG TAB, DELAYED RELEASE: 40 mg | ORAL | Qty: 1

## 2019-06-10 MED FILL — CEFEPIME 2 GRAM SOLUTION FOR INJECTION: 2 gram | INTRAMUSCULAR | Qty: 2

## 2019-06-10 MED FILL — ZINC-220 50 MG ZINC (220 MG) CAPSULE: 50 mg zinc (220 mg) | ORAL | Qty: 1

## 2019-06-10 MED FILL — DOK 100 MG CAPSULE: 100 mg | ORAL | Qty: 1

## 2019-06-10 NOTE — Progress Notes (Signed)
Progress Note    Patient: Michael Lozano MRN: [id number]  SSN: [id number]    Date of Birth: 21-Dec-1934  Age: 85 y.o.  Sex: male      Admit Date: 06/02/2019    LOS: 8 days            Assessment/plan:   1.  Generalized weakness/deconditioning.  Case management has been consulted for rehab placement.  2.  Recent COVID-19 virus infection.  Patient remains in contact and droplet isolation. Has taste changes and is frustrated with it  3.  Diabetes mellitus.  Currently on sliding scale insulin. Daughter sent a huge basket of food shich he has been snacking out of. His Glucose was >500 this afternoon and additional insulin was provided  4.  Acute kidney injury present on admission is now resolved.  Creatinine appears to be close to baseline.  5.  Diabetic neuropathy.  Continue gabapentin.      Current Facility-Administered Medications:   ???  ammonium lactate (LAC-HYDRIN) 12 % lotion, , Topical, DAILY, Pervis Hocking, MD, Given at 06/10/19 [phone]  ???  albumin human 25% (BUMINATE) solution 12.5 g, 12.5 g, IntraVENous, Q8H PRN, Posey Pronto, Deep V, MD  ???  insulin lispro (HUMALOG) injection, , SubCUTAneous, AC&HS, Meyer Russel, NP-C, Stopped at 06/09/19 2200  ???  gabapentin (NEURONTIN) capsule 300 mg, 300 mg, Oral, BID, Theodore Demark R, NP-C, 300 mg at 06/10/19 1700  ???  zinc sulfate (ZINCATE) 50 mg zinc (220 mg) capsule 1 Cap, 1 Cap, Oral, DAILY, Theodore Demark R, NP-C, 1 Cap at 06/10/19 [phone]  ???  pantoprazole (PROTONIX) tablet 40 mg, 40 mg, Oral, BID, Theodore Demark R, NP-C, 40 mg at 06/10/19 1700  ???  cholecalciferol (VITAMIN D3) (1000 Units /25 mcg) tablet 2,000 Units, 2,000 Units, Oral, DAILY, Theodore Demark R, NP-C, 2,000 Units at 06/10/19 0941  ???  sucralfate (CARAFATE) tablet 1 g, 1 g, Oral, AC&HS, Theodore Demark R, NP-C, 1 g at 06/10/19 1700  ???  docusate sodium (COLACE) capsule 100 mg, 100 mg, Oral, DAILY, Theodore Demark R, NP-C, 100 mg at 06/09/19 0854  ???  HYDROmorphone (PF) (DILAUDID) injection 0.5 mg, 0.5 mg,  IntraVENous, Q4H PRN, Arta Silence, NP, 0.5 mg at 06/05/19 0835  ???  dextrose 5% infusion, 50 mL/hr, IntraVENous, CONTINUOUS, Patel, Deep V, MD, Last Rate: 50 mL/hr at 06/09/19 2341, 50 mL/hr at 06/09/19 2341  ???  albuterol (PROVENTIL HFA, VENTOLIN HFA, PROAIR HFA) inhaler 1 Puff, 1 Puff, Inhalation, Q4H PRN, Erline Levine, NP  ???  glucose chewable tablet 16 g, 4 Tab, Oral, PRN, Erline Levine, NP  ???  dextrose (D50W) injection syrg 12.5-25 g, 25-50 mL, IntraVENous, PRN, Erline Levine, NP  ???  glucagon (GLUCAGEN) injection 1 mg, 1 mg, IntraMUSCular, PRN, Erline Levine, NP  ???  acetaminophen (TYLENOL) tablet 650 mg, 650 mg, Oral, Q6H PRN, 650 mg at 06/07/19 2030 **OR** acetaminophen (TYLENOL) suppository 650 mg, 650 mg, Rectal, Q6H PRN, Arta Silence, NP  ???  acetaminophen (TYLENOL) tablet 650 mg, 650 mg, Oral, Q6H, Sinnatamby, Diane S, MD, 650 mg at 06/10/19 1700  ???  sodium chloride (NS) flush 5-40 mL, 5-40 mL, IntraVENous, Q8H, Lockard, Sara H, NP, 10 mL at 06/10/19 1424  ???  sodium chloride (NS) flush 5-40 mL, 5-40 mL, IntraVENous, PRN, Lockard, Remi Deter, NP  ???  polyethylene glycol (MIRALAX) packet 17 g, 17 g, Oral, DAILY PRN, Lockard, Remi Deter, NP  ???  promethazine (PHENERGAN) tablet 12.5 mg, 12.5  mg, Oral, Q6H PRN **OR** ondansetron (ZOFRAN) injection 4 mg, 4 mg, IntraVENous, Q6H PRN, Lockard, Remi Deter, NP, 4 mg at 06/05/19 1320    Interval summary:  Patient was admitted to the intensive care unit on 06/02/2019 after presenting to the emergency department with acute kidney injury, hypernatremia.  Patient had been recently diagnosed with COVID-19 on 05/18/2019, and tested positive again on 06/02/2019.  Per emergency department records, P "He has had a gradual decline, very poor p.o. intake to the point that he has not been eating or drinking for several days. ??He also is somnolent and less interactive. ??He has not fallen or had recurrent fevers".  Patient was also hypothermic and hypotensive at admission.  There was concern for  sepsis, and he was started on IV fluids, and initial antibiotics IV cefepime and IV vancomycin.  Mild coffee-ground emesis was noted via nasogastric tube and gastroenterologist was consulted.  They recommended supportive care with either PPI twice daily or Pepcid twice daily, and felt that EGD was not indicated.  He has been followed by infectious disease specialist.  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  However, cultures on 05/17/2019 from the wound was positive for light E.cloacae complex, Providencia rettgeri sensitive to cefepime.   ID recommended Zyvox instead of vancomycin due to the potential contribution to nephrotoxicity.  IV Flagyl was added for concern for aspiration, and continued until 06/08/2019.  It was documented by ID on 06/06/2019 that  organisms in wound culture were probably colonization. urine cultures came back positive for  Pseudomonas and ecoli. Infectious disease specialist has been following throughout, and commended to continue IV cefepime for 1 week, stopping on the night of 06/09/2019.      06/10/2019: Patient's only complaint is he needs mac and cheese, potato chips and pepsi. He said sugar  Has nothing to do with it. He is happy about the basket his daughter brought him.     Subjective:     Patient has no complaints at this time, except for feeling weak.  He was living at home with family, and in this home, there are stairs which states that he certainly cannot manage at this time.  Patient states that he usually ambulates without any assistive devices.  Patient is interested in short-term inpatient rehab possibly skilled nursing facility long-term.  Patient states that he sometimes has pain in the right foot, but it is much improved.  Patient denies any pain anywhere else, nausea, vomiting, shortness of breath.  Patient has right plantar ulcer which is clean-based on exam.  Patient has not  had any fever per review of vital signs for the last 48 hours.    Objective:     Vitals:    06/09/19 0707 06/09/19 1113 06/09/19 1304 06/09/19 1442   BP: (!) 107/52 125/63 119/69 111/61   Pulse: 60 67 69 61   Resp: _0 Temp: 98 ??F (36.7 ??C) 97.8 ??F (36.6 ??C) 98.2 ??F (36.8 ??C) 98.6 ??F (37 ??C)   SpO2: 98% 97% 98% 96%   Oxygen therapy  room air  room air  room air  room air        Intake and Output:  Current Shift: No intake/output data recorded.  Last three shifts: No intake/output data recorded.    Physical Exam:   Estimated body mass index is 27.66 kg/m?? as calculated from the following:  Height as of this encounter: _0  (1.778 m).    Weight as of this encounter: 87.5 kg (192 lb 12.8 oz).   General: In no acute distress.  Well developed, well nourished.  Head: Normocephalic, atraumatic.  Eyes: Anicteric sclera.  PERRL.  Extraocular muscles intact.  ENT: External ears and nose appear normal.  Oral mucosa moist.  Neck: Supple.  No jugular venous distention.  Heart: Regular rate and rhythm.  No murmurs appreciated.  Chest: Symmetrical excursion.  Clear to auscultation bilaterally.  Abdomen: Soft, nontender.  No abnormal distention. Bowel sounds are present throughout.  Extremities: No gross deformities.  No particular edema, no cyanosis.  Feet are warm to touch.  Neurological: Being all extremities spontaneously with at least antigravity strength.  Alert, oriented X3.  Skin: No jaundice.  Patient has a clean-based quarter sized ulcer about 1 cm deep on the plantar aspect of his right foot at the location of the second metatarsal.      Lab/Data Review:  Recent Results (from the past 24 hour(s))   LACTIC ACID    Collection Time: 06/09/19 12:45 AM   Result Value Ref Range    Lactic acid 2.0 0.4 - 2.0 MMOL/L   MAGNESIUM    Collection Time: 06/09/19 12:45 AM   Result Value Ref Range    Magnesium 1.9 1.6 - 2.4 mg/dL   PHOSPHORUS    Collection Time: 06/09/19 12:45 AM   Result Value Ref Range    Phosphorus 2.0  (L) 2.6 - 4.7 MG/DL   METABOLIC PANEL, BASIC    Collection Time: 06/09/19 12:45 AM   Result Value Ref Range    Sodium 141 136 - 145 mmol/L    Potassium 4.3 3.5 - 5.1 mmol/L    Chloride 115 (H) 97 - 108 mmol/L    CO2 18 (L) 21 - 32 mmol/L    Anion gap 8 5 - 15 mmol/L    Glucose 155 (H) 65 - 100 mg/dL    BUN 18 6 - 20 MG/DL    Creatinine 1.33 (H) 0.70 - 1.30 MG/DL    BUN/Creatinine ratio 14 12 - 20      GFR est AA >60 >60 ml/min/1.84m    GFR est non-AA 51 (L) >60 ml/min/1.716m   Calcium 8.3 (L) 8.5 - 10.1 MG/DL   CBC W/O DIFF    Collection Time: 06/09/19 12:45 AM   Result Value Ref Range    WBC 7.4 4.1 - 11.1 K/uL    RBC 3.28 (L) 4.10 - 5.70 M/uL    HGB 9.2 (L) 12.1 - 17.0 g/dL    HCT 29.1 (L) 36.6 - 50.3 %    MCV 88.7 80.0 - 99.0 FL    MCH 28.0 26.0 - 34.0 PG    MCHC 31.6 30.0 - 36.5 g/dL    RDW 17.3 (H) 11.5 - 14.5 %    PLATELET 145 (L) 150 - 400 K/uL    MPV 12.5 8.9 - 12.9 FL    NRBC 0.0 0 PER 100 WBC    ABSOLUTE NRBC 0.00 0.00 - 0.01 K/uL   GLUCOSE, POC    Collection Time: 06/09/19  8:11 AM   Result Value Ref Range    Glucose (POC) 126 (H) 65 - 100 mg/dL    Performed by [facility]   GLUCOSE, POC    Collection Time: 06/09/19 11:14 AM   Result Value Ref Range    Glucose (POC) 172 (H) 65 - 100 mg/dL  Performed by [facility].    GLUCOSE, POC    Collection Time: 06/09/19  4:15 PM   Result Value Ref Range    Glucose (POC) 141 (H) 65 - 100 mg/dL    Performed by [facility]  [place]          Signed By: Sue Lush, MD     June 10, 2019

## 2019-06-10 NOTE — Progress Notes (Signed)
Patient refusing labs. Will inform day shift.

## 2019-06-10 NOTE — Progress Notes (Signed)
Patient refusing labs. Will inform day shift.

## 2019-06-10 NOTE — Progress Notes (Signed)
Progress Notes by Sue Lush, MD at 06/10/19 1808                Author: Sue Lush, MD  Service: Hospitalist  Author Type: Physician       Filed: 06/10/19 1812  Date of Service: 06/10/19 1808  Status: Signed          Editor: Sue Lush, MD (Physician)                               Progress Note          Patient: Michael Lozano  MRN: [id number]    SSN: [id number]           Date of Birth: 09-Feb-1935    Age: 85 y.o.    Sex: male          Admit Date: 06/02/2019      LOS: 8 days                   Assessment/plan :     1.  Generalized weakness/deconditioning.  Case management has been consulted for rehab placement.   2.  Recent COVID-19 virus infection.  Patient remains in contact and droplet isolation. Has taste changes and is frustrated with it   3.  Diabetes mellitus.  Currently on sliding scale insulin. Daughter sent a huge basket of food shich he has been snacking out of. His Glucose was >500 this afternoon and additional insulin was provided   4.  Acute kidney injury present on admission is now resolved.  Creatinine appears to be close to baseline.   5.  Diabetic neuropathy.  Continue gabapentin.         Current Facility-Administered Medications:    ?  ammonium lactate (LAC-HYDRIN) 12 % lotion, , Topical, DAILY, Pervis Hocking, MD, Given at 06/10/19 [phone]   ?  albumin human 25% (BUMINATE) solution 12.5 g, 12.5 g, IntraVENous, Q8H PRN, Patel, Deep V, MD   ?  insulin lispro (HUMALOG) injection, , SubCUTAneous, AC&HS, Theodore Demark R, NP-C, Stopped at 06/09/19 2200   ?  gabapentin (NEURONTIN) capsule 300 mg, 300 mg, Oral, BID, Theodore Demark R, NP-C, 300 mg at 06/10/19 1700   ?  zinc sulfate (ZINCATE) 50 mg zinc (220 mg) capsule 1 Cap, 1 Cap, Oral, DAILY, Theodore Demark R, NP-C, 1 Cap at 06/10/19 7628   ?  pantoprazole (PROTONIX) tablet 40 mg, 40 mg, Oral, BID, Theodore Demark R, NP-C, 40 mg at 06/10/19 1700   ?  cholecalciferol (VITAMIN D3) (1000 Units /25 mcg) tablet 2,000 Units, 2,000  Units, Oral, DAILY, Theodore Demark R, NP-C, 2,000 Units at 06/10/19 0941   ?  sucralfate (CARAFATE) tablet 1 g, 1 g, Oral, AC&HS, Theodore Demark R, NP-C, 1 g at 06/10/19 1700   ?  docusate sodium (COLACE) capsule 100 mg, 100 mg, Oral, DAILY, Theodore Demark R, NP-C, 100 mg at 06/09/19 0854   ?  HYDROmorphone (PF) (DILAUDID) injection 0.5 mg, 0.5 mg, IntraVENous, Q4H PRN, Arta Silence, NP, 0.5 mg at 06/05/19 0835   ?  dextrose 5% infusion, 50 mL/hr, IntraVENous, CONTINUOUS, Patel, Deep V, MD, Last Rate: 50 mL/hr at 06/09/19 2341, 50 mL/hr at 06/09/19 2341   ?  albuterol (PROVENTIL HFA, VENTOLIN HFA, PROAIR HFA) inhaler 1 Puff, 1 Puff, Inhalation, Q4H PRN, Erline Levine, NP   ?  glucose chewable tablet 16 g, 4 Tab, Oral, PRN, Gordy Levan,  Alyson Locket, NP   ?  dextrose (D50W) injection syrg 12.5-25 g, 25-50 mL, IntraVENous, PRN, Erline Levine, NP   ?  glucagon (GLUCAGEN) injection 1 mg, 1 mg, IntraMUSCular, PRN, Erline Levine, NP   ?  acetaminophen (TYLENOL) tablet 650 mg, 650 mg, Oral, Q6H PRN, 650 mg at 06/07/19 2030 **OR** acetaminophen (TYLENOL) suppository 650 mg, 650 mg, Rectal, Q6H PRN, Arta Silence, NP   ?  acetaminophen (TYLENOL) tablet 650 mg, 650 mg, Oral, Q6H, Sinnatamby, Diane S, MD, 650 mg at 06/10/19 1700   ?  sodium chloride (NS) flush 5-40 mL, 5-40 mL, IntraVENous, Q8H, Lockard, Sara H, NP, 10 mL at 06/10/19 1424   ?  sodium chloride (NS) flush 5-40 mL, 5-40 mL, IntraVENous, PRN, Lockard, Remi Deter, NP   ?  polyethylene glycol (MIRALAX) packet 17 g, 17 g, Oral, DAILY PRN, Lockard, Remi Deter, NP   ?  promethazine (PHENERGAN) tablet 12.5 mg, 12.5 mg, Oral, Q6H PRN **OR** ondansetron (ZOFRAN) injection 4 mg, 4 mg, IntraVENous, Q6H PRN, Lockard, Remi Deter, NP, 4 mg at 06/05/19 1320      Interval summary:   Patient was admitted to the intensive care unit on 06/02/2019 after presenting to the emergency department with acute kidney injury, hypernatremia.  Patient had been recently diagnosed with COVID-19  on 05/18/2019, and  tested positive again on 06/02/2019.  Per emergency department records, P "He has had a gradual decline, very poor p.o. intake to the point that he has not been eating or drinking for several days. ??He also is somnolent and less interactive.  ??He has not fallen or had recurrent fevers".  Patient was also hypothermic and hypotensive at admission.  There was concern for sepsis,  and he was started on IV fluids, and initial antibiotics IV cefepime and IV vancomycin.  Mild coffee-ground emesis was noted via nasogastric tube and gastroenterologist was consulted.  They recommended supportive care with either PPI twice daily or Pepcid  twice daily, and felt that EGD was not indicated.  He has been followed by infectious disease specialist.  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  However,  cultures on 05/17/2019 from the wound was positive for light E.cloacae complex, Providencia rettgeri sensitive to cefepime.    ID recommended Zyvox instead of vancomycin due to the potential contribution to nephrotoxicity.  IV Flagyl was added for concern for aspiration, and continued until 06/08/2019.  It was documented by  ID on 06/06/2019 that  organisms in wound culture were probably colonization. urine cultures came back positive for  Pseudomonas and ecoli. Infectious disease specialist has been following throughout, and commended to continue IV cefepime for 1 week, stopping  on the night of 06/09/2019.         06/10/2019: Patient's only complaint is he needs mac and cheese, potato chips and pepsi. He said sugar  Has nothing to do with it. He is happy about the basket his daughter brought him.         Subjective:        Patient has no complaints at this time, except for feeling weak.  He was living at home with family, and in this home, there are stairs which states that he certainly cannot manage at this time.  Patient  states that he usually  ambulates without any assistive devices.  Patient is interested in short-term inpatient rehab possibly skilled nursing facility long-term.  Patient states that he sometimes has pain in the right foot, but it is much improved.  Patient  denies any pain anywhere else, nausea, vomiting, shortness of breath.  Patient has right plantar ulcer which is clean-based on exam.  Patient has not had any fever per review of vital signs for the last 48 hours.        Objective:          Vitals:             06/09/19 0707  06/09/19 1113  06/09/19 1304  06/09/19 1442           BP:  (!) 107/52  125/63  119/69  111/61     Pulse:  60  67  69  61     Resp:  _0 Temp:  98 ??F (36.7 ??C)  97.8 ??F (36.6 ??C)  98.2 ??F (36.8 ??C)  98.6 ??F (37 ??C)     SpO2:  98%  97%  98%  96%     Oxygen therapy   room air   room air   room air   room air            Intake and Output:   Current Shift: No intake/output data recorded.   Last three shifts: No intake/output data recorded.      Physical Exam:    Estimated body mass index is 27.66 kg/m?? as calculated from the following:     Height as of this encounter: _1  (1.778 m).     Weight as of this encounter: 87.5 kg (192 lb 12.8 oz).    General: In no acute distress.  Well developed, well nourished.  Head: Normocephalic, atraumatic.  Eyes: Anicteric sclera.  PERRL.  Extraocular muscles intact.  ENT: External ears and nose appear normal.  Oral mucosa moist.  Neck: Supple.   No jugular venous distention.   Heart: Regular rate and rhythm.  No murmurs appreciated.  Chest: Symmetrical excursion.  Clear to auscultation bilaterally.  Abdomen: Soft, nontender.  No abnormal distention. Bowel sounds are present throughout.  Extremities: No gross deformities.   No particular edema, no cyanosis.  Feet are warm to touch.  Neurological: Being all extremities spontaneously with at least antigravity strength.  Alert, oriented X3.  Skin: No jaundice.  Patient has a clean-based quarter sized ulcer about 1  cm  deep on the plantar aspect of his right foot at the location of the second metatarsal.         Lab/Data Review:     Recent Results (from the past 24 hour(s))     LACTIC ACID          Collection Time: 06/09/19 12:45 AM         Result  Value  Ref Range            Lactic acid  2.0  0.4 - 2.0 MMOL/L       MAGNESIUM          Collection Time: 06/09/19 12:45 AM         Result  Value  Ref Range            Magnesium  1.9  1.6 - 2.4 mg/dL       PHOSPHORUS          Collection Time: 06/09/19 12:45 AM         Result  Value  Ref Range  Phosphorus  2.0 (L)  2.6 - 4.7 MG/DL       METABOLIC PANEL, BASIC          Collection Time: 06/09/19 12:45 AM         Result  Value  Ref Range            Sodium  141  136 - 145 mmol/L       Potassium  4.3  3.5 - 5.1 mmol/L       Chloride  115 (H)  97 - 108 mmol/L       CO2  18 (L)  21 - 32 mmol/L       Anion gap  8  5 - 15 mmol/L       Glucose  155 (H)  65 - 100 mg/dL       BUN  18  6 - 20 MG/DL       Creatinine  1.33 (H)  0.70 - 1.30 MG/DL       BUN/Creatinine ratio  14  12 - 20         GFR est AA  >60  >60 ml/min/1.52m       GFR est non-AA  51 (L)  >60 ml/min/1.749m      Calcium  8.3 (L)  8.5 - 10.1 MG/DL       CBC W/O DIFF          Collection Time: 06/09/19 12:45 AM         Result  Value  Ref Range            WBC  7.4  4.1 - 11.1 K/uL       RBC  3.28 (L)  4.10 - 5.70 M/uL       HGB  9.2 (L)  12.1 - 17.0 g/dL       HCT  29.1 (L)  36.6 - 50.3 %       MCV  88.7  80.0 - 99.0 FL       MCH  28.0  26.0 - 34.0 PG       MCHC  31.6  30.0 - 36.5 g/dL       RDW  17.3 (H)  11.5 - 14.5 %       PLATELET  145 (L)  150 - 400 K/uL       MPV  12.5  8.9 - 12.9 FL       NRBC  0.0  0 PER 100 WBC       ABSOLUTE NRBC  0.00  0.00 - 0.01 K/uL       GLUCOSE, POC          Collection Time: 06/09/19  8:11 AM         Result  Value  Ref Range            Glucose (POC)  126 (H)  65 - 100 mg/dL       Performed by  [facility]        GLUCOSE, POC          Collection Time: 06/09/19 11:14 AM         Result  Value   Ref Range            Glucose (POC)  172 (H)  65 - 100 mg/dL       Performed by  [facility]        GLUCOSE, POC  Collection Time: 06/09/19  4:15 PM         Result  Value  Ref Range            Glucose (POC)  141 (H)  65 - 100 mg/dL            Performed by  [facility]  [place]                   Signed By:  Sue Lush, MD           June 10, 2019

## 2019-06-11 LAB — GLUCOSE, POC
Glucose (POC): 104 mg/dL — ABNORMAL HIGH (ref 65–100)
Glucose (POC): 96 mg/dL (ref 65–100)
Glucose (POC): 142 mg/dL — ABNORMAL HIGH (ref 65–100)
Glucose (POC): 118 mg/dL — ABNORMAL HIGH (ref 65–100)

## 2019-06-11 LAB — POCT GLUCOSE
POC Glucose: 104 mg/dL — ABNORMAL HIGH (ref 65–100)
POC Glucose: 96 mg/dL (ref 65–100)
POC Glucose: 142 mg/dL — ABNORMAL HIGH (ref 65–100)
POC Glucose: 118 mg/dL — ABNORMAL HIGH (ref 65–100)

## 2019-06-11 MED FILL — INSULIN LISPRO 100 UNIT/ML INJECTION: 100 unit/mL | SUBCUTANEOUS | Qty: 1

## 2019-06-11 MED FILL — SUCRALFATE 1 GRAM TAB: 1 gram | ORAL | Qty: 1

## 2019-06-11 MED FILL — PANTOPRAZOLE 40 MG TAB, DELAYED RELEASE: 40 mg | ORAL | Qty: 1

## 2019-06-11 MED FILL — GABAPENTIN 300 MG CAP: 300 mg | ORAL | Qty: 1

## 2019-06-11 MED FILL — ACETAMINOPHEN 325 MG TABLET: 325 mg | ORAL | Qty: 2

## 2019-06-11 MED FILL — ZINC-220 50 MG ZINC (220 MG) CAPSULE: 50 mg zinc (220 mg) | ORAL | Qty: 1

## 2019-06-11 MED FILL — DOK 100 MG CAPSULE: 100 mg | ORAL | Qty: 1

## 2019-06-11 MED FILL — CHOLECALCIFEROL (VITAMIN D3) 1,000 UNIT (25 MCG) TAB: ORAL | Qty: 2

## 2019-06-11 NOTE — Progress Notes (Addendum)
Comprehensive Nutrition Assessment    Type and Reason for Visit: Initial, RD nutrition re-screen/LOS      Nutrition Recommendations/Plan:      1. Consider d/c D5 @ 50 mL/hr given improvements in PO (especially if planning d/c so we can see how his BG will trend without it)  2. Recommend rechecking Phos and give repletion if indicated  3. Will continue current diet regimen for now.  Encourage PO intake.  4. Can we get a standing weight?  Wt ranging 164 lb- 192 lb this admission.      Nutrition Assessment:     PMHx includes DM and recent COVID 19 infection.  Remains COVID+.  Admitted with poor PO intake, AKI which has resolved.  Awaiting insurance auth for rehab placement.    Noted c/o taste changes, poor appetite - although improving.  Does not like much of our food, but has found some tolerable options (hamburgers, grilled cheese, french fries).  Daughter has brought him basket full of snacks which he has been eating out of and much happier since.  Noted  BG was >500 yesterday, insulin given.  Remains solely on correctional scale at this time.  Unclear what meds he was taking PTA, but HbA1C only 6.2%, so well controlled.    Unclear if taking Ensure Enlive, but does have high sugar content.  However, pt is still on D5@ 50 mL/hr.. BG well controlled at this time (other than yesterday's episode).      Appears recent wt loss, but given severity/ time frame, I ?accuracy.  Noted majority of measured wts (those with recorded sources) are in the 160s - where as additional wts up between 179-192 lb.  My guess would be weight taken on 05/17/19 if carried over from admission in Sept 2020.  However, unclear whether went from 198 lb to 164 lb vs just 192 lb over past 3 weeks.  Would be helpful to obtain standing weight for accuracy - but may not be possible with current foot wound.    For now, do not see any need to make changes.  RN to try and find out whether drinking ONS at all, or if we can d/c.  I recommend stopping D5 to  make sure he can maintain BG and hydration prior to leaving..       Malnutrition Assessment:  Malnutrition Status:  At risk for malnutrition (but not enough info - possible signicant wt loss)        Nutritionally Significant Medications:   Vit D3, D5@ 50 mL/hr, Colace, correctional scale, Protonix, carafate, Zincate  PRN: Buminate      Estimated Daily Nutrient Needs:  Energy (kcal): 1900(MSJ x 1.2)  Protein (g): 95(1.1 gm/kg or ~20%)  Fluid (ml/day): 1 mL/kcal  Weight used: 87.5 kg    Nutrition Related Findings:   Edema: none  Last BM: 4/25 - soft      Wounds:    Full thickness, Diabetic ulcer   Per WOCN 4/23:  POA??right plantar diabetic foot ulcer??  Full thickness??      Current Nutrition Therapies:  Diet: Regular  Supplements: Ensure Enlive BID @ breakfast and dinner; Ensure High Protein at lunch  Meal intake:   Patient Vitals for the past 168 hrs:   % Diet Eaten   06/08/19 1340 76 - 100%   06/08/19 0900 76 - 100%         Anthropometric Measures:  ?? Height:  5' 10" (177.8 cm)  ?? Current Body Wt:  87.5 kg (  192 lb 12.8 oz)   ?? Ideal Body Wt:  166 lbs:  116.1 %   ?? BMI Category:  Overweight (BMI 25.0-29.9)       Wt Readings from Last 20 Encounters:   06/10/19 87.5 kg (192 lb 12.8 oz) - source not specified   06/09/19 74.8 kg (164 lb 14.4 oz) - bed   05/28/19 77.7 kg (171 lb 4.8 oz)   05/18/19 89.8 kg (198 lb)   05/17/19 93.4 kg (205 lb 14.6 oz)   10/21/18 93.4 kg (206 lb)   08/18/11 86.8 kg (191 lb 5.8 oz)   07/23/11 90.2 kg (198 lb 14.4 oz)       Nutrition Diagnosis:   ?? Increased nutrient needs related to increased demand for energy/nutrients as evidenced by wounds      Nutrition Interventions:   Food and/or Nutrient Delivery: Continue current diet, Continue oral nutrition supplement  Nutrition Education and Counseling: No recommendations at this time  Coordination of Nutrition Care: Continue to monitor while inpatient, Interdisciplinary rounds    Goals:  PO >50% of most meals with 1 ONS daily over next 7 days        Nutrition Monitoring and Evaluation:   Behavioral-Environmental Outcomes: None identified  Food/Nutrient Intake Outcomes: Food and nutrient intake, Supplement intake, IVF intake  Physical Signs/Symptoms Outcomes: Biochemical data, GI status, Meal time behavior, Skin, Weight    Discharge Planning:    Continue current diet, Continue oral nutrition supplement(if taking ONS)     Recent Labs     06/09/19  0045   GLU 155*   BUN 18   CREA 1.33*   NA 141   K 4.3   CL 115*   CO2 18*   CA 8.3*   PHOS 2.0*   MG 1.9       Recent Labs     06/09/19  0045   LAC 2.0       Recent Labs     06/09/19  0045   WBC 7.4   HGB 9.2*   HCT 29.1*   PLT 145*       Recent Labs     06/11/19  1131 06/11/19  0647 06/10/19  2118 06/10/19  1623 06/10/19  1102 06/10/19  0716 06/09/19  2314 06/09/19  1615 06/09/19  1114 06/09/19  0811 06/08/19  2015 06/08/19  1727   GLUCPOC 142* 96 104* 520* 122* 113* 94 141* 172* 126* 118* 99       Lab Results   Component Value Date/Time    Hemoglobin A1c 6.2 (H) 06/03/2019 08:48 AM    Hemoglobin A1c 5.8 08/09/2011 12:15 PM         Meghan Jarome Matin, RD  Available via [employer]

## 2019-06-11 NOTE — Progress Notes (Addendum)
Transition of Care: TBD; likely a SNF; [facility] has accepted; awaiting insurance auth     Transport Plan: likely BLS (not set up yet)    RUR: 25%    Main contact- daughter- Linda- [phone]    Patient is covid positive; need a SNF;     0930: this CM called Diane at [facility] to inform of patient needing SNF; left message; waiting for call back    1000: Stacy from [facility] has called and accepted patient pending insurance auth  1145: this CM called patients daughter, Linda Gouveia [phone] to inform of discharge plan; she verbalized understanding    1500: Diane at [facility] called back; patient is not a [facility] patient        CM following  Cary Butcher, RN, CRM

## 2019-06-11 NOTE — Progress Notes (Signed)
Problem: Mobility Impaired (Adult and Pediatric)  Goal: *Acute Goals and Plan of Care (Insert Text)  Description: FUNCTIONAL STATUS PRIOR TO ADMISSION: Patient was modified independent using a single point cane for functional mobility.    HOME SUPPORT PRIOR TO ADMISSION: The patient lived with granddaughter and family but did not require assist until recently.     Physical Therapy Goals  Initiated 06/06/2019  1.  Patient will move from supine to sit and sit to supine , scoot up and down, and roll side to side in bed with modified independence within 7 day(s).    2.  Patient will transfer from bed to chair and chair to bed with supervision/set-up using the least restrictive device within 7 day(s).  3.  Patient will perform sit to stand with supervision/set-up within 7 day(s).  4.  Patient will ambulate with minimal assistance/contact guard assist for 150 feet with the least restrictive device within 7 day(s).   5.  Patient will ascend/descend 2 stairs with one handrail(s) with minimal assistance/contact guard assist within 7 day(s).      Outcome: Progressing Towards Goal     PHYSICAL THERAPY TREATMENT  Patient: Michael Lozano (85 y.o. male)  Date: 06/11/2019  Diagnosis: AKI (acute kidney injury) ([place]) [N17.9]  Hypernatremia [E87.0]  GI bleed [K92.2] <principal problem not specified>       Precautions: PWB(RLE in off loading shoe)  Chart, physical therapy assessment, plan of care and goals were reviewed.    ASSESSMENT  Patient continues with skilled PT services and is slowly progressing towards goals however remains irritable and noncompliant. Pt received in bed c/o "not being fed", noted lunch tray 100% completed, three empty ice cream containers. Pt admits to ice cream but denies that he has been fed lunch, stating "someone else must have eaten it." Pt participates in standing however noncompliant with PWB and shoe, states "I have been doing this for years and its fine" and does not follow cues to sit down and place  shoe. Pt ambulates to chair and begins c/o needing food again, looks through his basket and begins eating. Pt given all needs requested, refuses further mobility and TE.    Current Level of Function Impacting Discharge (mobility/balance): min A     Other factors to consider for discharge: patient noncompliant         PLAN :  Patient continues to benefit from skilled intervention to address the above impairments.  Continue treatment per established plan of care.  to address goals.    Recommendation for discharge: (in order for the patient to meet his/her long term goals)  Therapy up to 5 days/week in SNF setting    This discharge recommendation:  Has been made in collaboration with the attending provider and/or case management    IF patient discharges home will need the following DME: rolling walker       SUBJECTIVE:   Patient stated ???you wanted to walk, so im walking, leave me alone.???    OBJECTIVE DATA SUMMARY:   Critical Behavior:  Neurologic State: Alert, Irritable  Orientation Level: Disoriented to situation, Disoriented to time, Oriented to person, Oriented to place  Cognition: Decreased attention/concentration, Decreased command following, Impaired decision making, Impulsive, Poor safety awareness, Memory loss  Safety/Judgement: Decreased awareness of environment, Decreased awareness of need for assistance, Decreased insight into deficits, Decreased awareness of need for safety  Functional Mobility Training:  Bed Mobility:     Supine to Sit: Supervision  Sit to Supine: Supervision  Scooting: Supervision  Transfers:  Sit to Stand: Supervision  Stand to Sit: Supervision                             Balance:  Sitting: Intact  Standing: Intact;With support(not following commands from PT for weight bearing)  Standing - Static: Constant support;Fair  Standing - Dynamic : Constant support;Fair  Ambulation/Gait Training:  Distance (ft): 25 Feet (ft)  Assistive Device: Gait belt;Orthotic device;Walker, rolling   Ambulation - Level of Assistance: Contact guard assistance        Gait Abnormalities: Shuffling gait  Right Side Weight Bearing: Partial (%)(in offloading shoe, noncompliant)     Base of Support: Widened     Speed/Cadence: Shuffled;Slow  Step Length: Right shortened;Left shortened    Pain Rating:  Denies pain, c/o hunger    Activity Tolerance:   Fair and requires rest breaks    After treatment patient left in no apparent distress:   Sitting in chair, Call bell within reach, and Bed / chair alarm activated    COMMUNICATION/COLLABORATION:   The patient???s plan of care was discussed with: Registered nurse.     Rhett Bannister Hutcherson, PT   Time Calculation: 19 mins

## 2019-06-11 NOTE — Progress Notes (Signed)
Progress Note    Patient: Michael Lozano MRN: [id number]  SSN: [id number]    Date of Birth: Dec 10, 1934  Age: 85 y.o.  Sex: male      Admit Date: 06/02/2019    LOS: 9 days      Awaiting insurance Authorization for placement      Assessment/plan:   1.  Generalized weakness/deconditioning.  Case management has been consulted for rehab placement.  2.  Recent COVID-19 virus infection.  Patient remains in contact and droplet isolation. Has taste changes and is frustrated with it  3.  Diabetes mellitus.  Currently on sliding scale insulin. Daughter sent a huge basket of food shich he has been snacking out of. His Glucose remains irregular as a result  4.  Acute kidney injury present on admission is now resolved.  Creatinine appears to be close to baseline.  5.  Diabetic neuropathy.  Continue gabapentin.      Current Facility-Administered Medications:   ???  ammonium lactate (LAC-HYDRIN) 12 % lotion, , Topical, DAILY, Pervis Hocking, MD, Given at 06/11/19 0955  ???  albumin human 25% (BUMINATE) solution 12.5 g, 12.5 g, IntraVENous, Q8H PRN, Posey Pronto, Deep V, MD  ???  insulin lispro (HUMALOG) injection, , SubCUTAneous, AC&HS, Meyer Russel, NP-C, 2 Units at 06/11/19 1228  ???  gabapentin (NEURONTIN) capsule 300 mg, 300 mg, Oral, BID, Theodore Demark R, NP-C, 300 mg at 06/11/19 0954  ???  zinc sulfate (ZINCATE) 50 mg zinc (220 mg) capsule 1 Cap, 1 Cap, Oral, DAILY, Theodore Demark R, NP-C, 1 Cap at 06/11/19 6063  ???  pantoprazole (PROTONIX) tablet 40 mg, 40 mg, Oral, BID, Theodore Demark R, NP-C, 40 mg at 06/11/19 0160  ???  cholecalciferol (VITAMIN D3) (1000 Units /25 mcg) tablet 2,000 Units, 2,000 Units, Oral, DAILY, Theodore Demark R, NP-C, 2,000 Units at 06/11/19 0954  ???  sucralfate (CARAFATE) tablet 1 g, 1 g, Oral, AC&HS, Theodore Demark R, NP-C, 1 g at 06/11/19 1228  ???  docusate sodium (COLACE) capsule 100 mg, 100 mg, Oral, DAILY, Theodore Demark R, NP-C, 100 mg at 06/11/19 0954  ???  HYDROmorphone (PF) (DILAUDID) injection 0.5  mg, 0.5 mg, IntraVENous, Q4H PRN, Arta Silence, NP, 0.5 mg at 06/05/19 0835  ???  dextrose 5% infusion, 50 mL/hr, IntraVENous, CONTINUOUS, Patel, Deep V, MD, Last Rate: 50 mL/hr at 06/11/19 0048, 50 mL/hr at 06/11/19 0048  ???  albuterol (PROVENTIL HFA, VENTOLIN HFA, PROAIR HFA) inhaler 1 Puff, 1 Puff, Inhalation, Q4H PRN, Erline Levine, NP  ???  glucose chewable tablet 16 g, 4 Tab, Oral, PRN, Erline Levine, NP  ???  dextrose (D50W) injection syrg 12.5-25 g, 25-50 mL, IntraVENous, PRN, Erline Levine, NP  ???  glucagon (GLUCAGEN) injection 1 mg, 1 mg, IntraMUSCular, PRN, Erline Levine, NP  ???  acetaminophen (TYLENOL) tablet 650 mg, 650 mg, Oral, Q6H PRN, 650 mg at 06/07/19 2030 **OR** acetaminophen (TYLENOL) suppository 650 mg, 650 mg, Rectal, Q6H PRN, Arta Silence, NP  ???  acetaminophen (TYLENOL) tablet 650 mg, 650 mg, Oral, Q6H, Sinnatamby, Diane S, MD, 650 mg at 06/11/19 1228  ???  sodium chloride (NS) flush 5-40 mL, 5-40 mL, IntraVENous, Q8H, Lockard, Sara H, NP, 10 mL at 06/10/19 2115  ???  sodium chloride (NS) flush 5-40 mL, 5-40 mL, IntraVENous, PRN, Lockard, Remi Deter, NP  ???  polyethylene glycol (MIRALAX) packet 17 g, 17 g, Oral, DAILY PRN, Lockard, Remi Deter, NP  ???  promethazine (PHENERGAN) tablet 12.5 mg,  12.5 mg, Oral, Q6H PRN **OR** ondansetron (ZOFRAN) injection 4 mg, 4 mg, IntraVENous, Q6H PRN, Lockard, Remi Deter, NP, 4 mg at 06/05/19 1320    Interval summary:  Patient was admitted to the intensive care unit on 06/02/2019 after presenting to the emergency department with acute kidney injury, hypernatremia.  Patient had been recently diagnosed with COVID-19 on 05/18/2019, and tested positive again on 06/02/2019.  Per emergency department records, P "He has had a gradual decline, very poor p.o. intake to the point that he has not been eating or drinking for several days. ??He also is somnolent and less interactive. ??He has not fallen or had recurrent fevers".  Patient was also hypothermic and hypotensive at admission.  There was  concern for sepsis, and he was started on IV fluids, and initial antibiotics IV cefepime and IV vancomycin.  Mild coffee-ground emesis was noted via nasogastric tube and gastroenterologist was consulted.  They recommended supportive care with either PPI twice daily or Pepcid twice daily, and felt that EGD was not indicated.  He has been followed by infectious disease specialist.  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  However, cultures on 05/17/2019 from the wound was positive for light E.cloacae complex, Providencia rettgeri sensitive to cefepime.   ID recommended Zyvox instead of vancomycin due to the potential contribution to nephrotoxicity.  IV Flagyl was added for concern for aspiration, and continued until 06/08/2019.  It was documented by ID on 06/06/2019 that  organisms in wound culture were probably colonization. urine cultures came back positive for  Pseudomonas and ecoli. Infectious disease specialist has been following throughout, and commended to continue IV cefepime for 1 week, stopping on the night of 06/09/2019.      06/10/2019: Patient's only complaint is he needs mac and cheese, potato chips and pepsi. He said sugar  Has nothing to do with it. He is happy about the basket his daughter brought him.     Subjective:     Patient has no complaints at this time, except for feeling weak.  He was living at home with family, and in this home, there are stairs which states that he certainly cannot manage at this time.  Patient states that he usually ambulates without any assistive devices.  Patient is interested in short-term inpatient rehab possibly skilled nursing facility long-term.  Patient states that he sometimes has pain in the right foot, but it is much improved.  Patient denies any pain anywhere else, nausea, vomiting, shortness of breath.  Patient has right plantar ulcer which is clean-based on exam.   Patient has not had any fever per review of vital signs for the last 48 hours.    Objective:     Vitals:    06/09/19 0707 06/09/19 1113 06/09/19 1304 06/09/19 1442   BP: (!) 107/52 125/63 119/69 111/61   Pulse: 60 67 69 61   Resp: _0 Temp: 98 ??F (36.7 ??C) 97.8 ??F (36.6 ??C) 98.2 ??F (36.8 ??C) 98.6 ??F (37 ??C)   SpO2: 98% 97% 98% 96%   Oxygen therapy  room air  room air  room air  room air        Intake and Output:  Current Shift: No intake/output data recorded.  Last three shifts: No intake/output data recorded.    Physical Exam:   Estimated body mass index is 27.66 kg/m?? as calculated from the following:  Height as of this encounter: _0  (1.778 m).    Weight as of this encounter: 87.5 kg (192 lb 12.8 oz).   General: In no acute distress.  Well developed, well nourished.  Head: Normocephalic, atraumatic.  Eyes: Anicteric sclera.  PERRL.  Extraocular muscles intact.  ENT: External ears and nose appear normal.  Oral mucosa moist.  Neck: Supple.  No jugular venous distention.  Heart: Regular rate and rhythm.  No murmurs appreciated.  Chest: Symmetrical excursion.  Clear to auscultation bilaterally.  Abdomen: Soft, nontender.  No abnormal distention. Bowel sounds are present throughout.  Extremities: No gross deformities.  No particular edema, no cyanosis.  Feet are warm to touch.  Neurological: Being all extremities spontaneously with at least antigravity strength.  Alert, oriented X3.  Skin: No jaundice.  Patient has a clean-based quarter sized ulcer about 1 cm deep on the plantar aspect of his right foot at the location of the second metatarsal.      Lab/Data Review:  Recent Results (from the past 24 hour(s))   LACTIC ACID    Collection Time: 06/09/19 12:45 AM   Result Value Ref Range    Lactic acid 2.0 0.4 - 2.0 MMOL/L   MAGNESIUM    Collection Time: 06/09/19 12:45 AM   Result Value Ref Range    Magnesium 1.9 1.6 - 2.4 mg/dL   PHOSPHORUS    Collection Time: 06/09/19 12:45 AM   Result Value Ref Range     Phosphorus 2.0 (L) 2.6 - 4.7 MG/DL   METABOLIC PANEL, BASIC    Collection Time: 06/09/19 12:45 AM   Result Value Ref Range    Sodium 141 136 - 145 mmol/L    Potassium 4.3 3.5 - 5.1 mmol/L    Chloride 115 (H) 97 - 108 mmol/L    CO2 18 (L) 21 - 32 mmol/L    Anion gap 8 5 - 15 mmol/L    Glucose 155 (H) 65 - 100 mg/dL    BUN 18 6 - 20 MG/DL    Creatinine 1.33 (H) 0.70 - 1.30 MG/DL    BUN/Creatinine ratio 14 12 - 20      GFR est AA >60 >60 ml/min/1.90m    GFR est non-AA 51 (L) >60 ml/min/1.766m   Calcium 8.3 (L) 8.5 - 10.1 MG/DL   CBC W/O DIFF    Collection Time: 06/09/19 12:45 AM   Result Value Ref Range    WBC 7.4 4.1 - 11.1 K/uL    RBC 3.28 (L) 4.10 - 5.70 M/uL    HGB 9.2 (L) 12.1 - 17.0 g/dL    HCT 29.1 (L) 36.6 - 50.3 %    MCV 88.7 80.0 - 99.0 FL    MCH 28.0 26.0 - 34.0 PG    MCHC 31.6 30.0 - 36.5 g/dL    RDW 17.3 (H) 11.5 - 14.5 %    PLATELET 145 (L) 150 - 400 K/uL    MPV 12.5 8.9 - 12.9 FL    NRBC 0.0 0 PER 100 WBC    ABSOLUTE NRBC 0.00 0.00 - 0.01 K/uL   GLUCOSE, POC    Collection Time: 06/09/19  8:11 AM   Result Value Ref Range    Glucose (POC) 126 (H) 65 - 100 mg/dL    Performed by [facility]   GLUCOSE, POC    Collection Time: 06/09/19 11:14 AM   Result Value Ref Range    Glucose (POC) 172 (H) 65 - 100 mg/dL  Performed by [facility].    GLUCOSE, POC    Collection Time: 06/09/19  4:15 PM   Result Value Ref Range    Glucose (POC) 141 (H) 65 - 100 mg/dL    Performed by [facility]  [place]          Signed By: Sue Lush, MD     June 11, 2019

## 2019-06-11 NOTE — Progress Notes (Signed)
 Comprehensive Nutrition Assessment    Type and Reason for Visit: Initial, RD nutrition re-screen/LOS      Nutrition Recommendations/Plan:      1. Consider d/c D5 @ 50 mL/hr given improvements in PO (especially if planning d/c so we can see how his BG will trend without it)  2. Recommend rechecking Phos and give repletion if indicated  3. Will continue current diet regimen for now.  Encourage PO intake.  4. Can we get a standing weight?  Wt ranging 164 lb- 192 lb this admission.      Nutrition Assessment:     PMHx includes DM and recent COVID 19 infection.  Remains COVID+.  Admitted with poor PO intake, AKI which has resolved.  Awaiting insurance auth for rehab placement.    Noted c/o taste changes, poor appetite - although improving.  Does not like much of our food, but has found some tolerable options (hamburgers, grilled cheese, french fries).  Daughter has brought him basket full of snacks which he has been eating out of and much happier since.  Noted  BG was >500 yesterday, insulin given.  Remains solely on correctional scale at this time.  Unclear what meds he was taking PTA, but HbA1C only 6.2%, so well controlled.    Unclear if taking Ensure Enlive, but does have high sugar content.  However, pt is still on D5@ 50 mL/hr.. BG well controlled at this time (other than yesterday's episode).      Appears recent wt loss, but given severity/ time frame, I ?accuracy.  Noted majority of measured wts (those with recorded sources) are in the 160s - where as additional wts up between 179-192 lb.  My guess would be weight taken on 05/17/19 if carried over from admission in Sept 2020.  However, unclear whether went from 198 lb to 164 lb vs just 192 lb over past 3 weeks.  Would be helpful to obtain standing weight for accuracy - but may not be possible with current foot wound.    For now, do not see any need to make changes.  RN to try and find out whether drinking ONS at all, or if we can d/c.  I recommend stopping D5 to  make sure he can maintain BG and hydration prior to leaving..       Malnutrition Assessment:  Malnutrition Status:  At risk for malnutrition (but not enough info - possible signicant wt loss)        Nutritionally Significant Medications:   Vit D3, D5@ 50 mL/hr, Colace, correctional scale, Protonix, carafate, Zincate  PRN: Buminate      Estimated Daily Nutrient Needs:  Energy (kcal): 1900(MSJ x 1.2)  Protein (g): 95(1.1 gm/kg or ~20%)  Fluid (ml/day): 1 mL/kcal  Weight used: 87.5 kg    Nutrition Related Findings:   Edema: none  Last BM: 4/25 - soft      Wounds:    Full thickness, Diabetic ulcer   Per WOCN 4/23:  POAright plantar diabetic foot ulcer  Full thickness      Current Nutrition Therapies:  Diet: Regular  Supplements: Ensure Enlive BID @ breakfast and dinner; Ensure High Protein at lunch  Meal intake:   Patient Vitals for the past 168 hrs:   % Diet Eaten   06/08/19 1340 76 - 100%   06/08/19 0900 76 - 100%         Anthropometric Measures:   Height:  5' 10 (177.8 cm)   Current Body Wt:  87.5 kg (  192 lb 12.8 oz)    Ideal Body Wt:  166 lbs:  116.1 %    BMI Category:  Overweight (BMI 25.0-29.9)       Wt Readings from Last 20 Encounters:   06/10/19 87.5 kg (192 lb 12.8 oz) - source not specified   06/09/19 74.8 kg (164 lb 14.4 oz) - bed   05/28/19 77.7 kg (171 lb 4.8 oz)   05/18/19 89.8 kg (198 lb)   05/17/19 93.4 kg (205 lb 14.6 oz)   10/21/18 93.4 kg (206 lb)   08/18/11 86.8 kg (191 lb 5.8 oz)   07/23/11 90.2 kg (198 lb 14.4 oz)       Nutrition Diagnosis:    Increased nutrient needs related to increased demand for energy/nutrients as evidenced by wounds      Nutrition Interventions:   Food and/or Nutrient Delivery: Continue current diet, Continue oral nutrition supplement  Nutrition Education and Counseling: No recommendations at this time  Coordination of Nutrition Care: Continue to monitor while inpatient, Interdisciplinary rounds    Goals:  PO >50% of most meals with 1 ONS daily over next 7 days        Nutrition Monitoring and Evaluation:   Behavioral-Environmental Outcomes: None identified  Food/Nutrient Intake Outcomes: Food and nutrient intake, Supplement intake, IVF intake  Physical Signs/Symptoms Outcomes: Biochemical data, GI status, Meal time behavior, Skin, Weight    Discharge Planning:    Continue current diet, Continue oral nutrition supplement(if taking ONS)     Recent Labs     06/09/19  0045   GLU 155*   BUN 18   CREA 1.33*   NA 141   K 4.3   CL 115*   CO2 18*   CA 8.3*   PHOS 2.0*   MG 1.9       Recent Labs     06/09/19  0045   LAC 2.0       Recent Labs     06/09/19  0045   WBC 7.4   HGB 9.2*   HCT 29.1*   PLT 145*       Recent Labs     06/11/19  1131 06/11/19  0647 06/10/19  2118 06/10/19  1623 06/10/19  1102 06/10/19  0716 06/09/19  2314 06/09/19  1615 06/09/19  1114 06/09/19  0811 06/08/19  2015 06/08/19  1727   GLUCPOC 142* 96 104* 520* 122* 113* 94 141* 172* 126* 118* 99       Lab Results   Component Value Date/Time    Hemoglobin A1c 6.2 (H) 06/03/2019 08:48 AM    Hemoglobin A1c 5.8 08/09/2011 12:15 PM         Meghan JONELLE Molt, RD  Available via [employer]

## 2019-06-11 NOTE — Progress Notes (Signed)
Progress Notes by Sue Lush, MD at 06/11/19 1750                Author: Sue Lush, MD  Service: Hospitalist  Author Type: Physician       Filed: 06/11/19 1751  Date of Service: 06/11/19 1750  Status: Signed          Editor: Sue Lush, MD (Physician)                               Progress Note          Patient: Michael Lozano  MRN: [id number]    SSN: [id number]           Date of Birth: [birth year]    Age: 85 y.o.    Sex: male          Admit Date: 06/02/2019      LOS: 9 days        Awaiting insurance Authorization for placement           Assessment/plan :     1.  Generalized weakness/deconditioning.  Case management has been consulted for rehab placement.   2.  Recent COVID-19 virus infection.  Patient remains in contact and droplet isolation. Has taste changes and is frustrated with it   3.  Diabetes mellitus.  Currently on sliding scale insulin. Daughter sent a huge basket of food shich he has been snacking out of. His Glucose remains irregular as a result   4.  Acute kidney injury present on admission is now resolved.  Creatinine appears to be close to baseline.   5.  Diabetic neuropathy.  Continue gabapentin.         Current Facility-Administered Medications:    ?  ammonium lactate (LAC-HYDRIN) 12 % lotion, , Topical, DAILY, Pervis Hocking, MD, Given at 06/11/19 [id number]   ?  albumin human 25% (BUMINATE) solution 12.5 g, 12.5 g, IntraVENous, Q8H PRN, Patel, Deep V, MD   ?  insulin lispro (HUMALOG) injection, , SubCUTAneous, AC&HS, Theodore Demark R, NP-C, 2 Units at 06/11/19 1228   ?  gabapentin (NEURONTIN) capsule 300 mg, 300 mg, Oral, BID, Theodore Demark R, NP-C, 300 mg at 06/11/19 4174   ?  zinc sulfate (ZINCATE) 50 mg zinc (220 mg) capsule 1 Cap, 1 Cap, Oral, DAILY, Theodore Demark R, NP-C, 1 Cap at 06/11/19 0814   ?  pantoprazole (PROTONIX) tablet 40 mg, 40 mg, Oral, BID, Theodore Demark R, NP-C, 40 mg at 06/11/19 4818   ?  cholecalciferol (VITAMIN D3) (1000 Units /25 mcg) tablet 2,000  Units, 2,000 Units, Oral, DAILY, Theodore Demark R, NP-C, 2,000 Units at 06/11/19 5631   ?  sucralfate (CARAFATE) tablet 1 g, 1 g, Oral, AC&HS, Theodore Demark R, NP-C, 1 g at 06/11/19 1228   ?  docusate sodium (COLACE) capsule 100 mg, 100 mg, Oral, DAILY, Theodore Demark R, NP-C, 100 mg at 06/11/19 0954   ?  HYDROmorphone (PF) (DILAUDID) injection 0.5 mg, 0.5 mg, IntraVENous, Q4H PRN, Arta Silence, NP, 0.5 mg at 06/05/19 0835   ?  dextrose 5% infusion, 50 mL/hr, IntraVENous, CONTINUOUS, Patel, Deep V, MD, Last Rate: 50 mL/hr at 06/11/19 0048, 50 mL/hr at 06/11/19 0048   ?  albuterol (PROVENTIL HFA, VENTOLIN HFA, PROAIR HFA) inhaler 1 Puff, 1 Puff, Inhalation, Q4H PRN, Erline Levine, NP   ?  glucose chewable tablet 16 g, 4 Tab, Oral, PRN,  Erline Levine, NP   ?  dextrose (D50W) injection syrg 12.5-25 g, 25-50 mL, IntraVENous, PRN, Erline Levine, NP   ?  glucagon (GLUCAGEN) injection 1 mg, 1 mg, IntraMUSCular, PRN, Erline Levine, NP   ?  acetaminophen (TYLENOL) tablet 650 mg, 650 mg, Oral, Q6H PRN, 650 mg at 06/07/19 2030 **OR** acetaminophen (TYLENOL) suppository 650 mg, 650 mg, Rectal, Q6H PRN, Arta Silence, NP   ?  acetaminophen (TYLENOL) tablet 650 mg, 650 mg, Oral, Q6H, Sinnatamby, Diane S, MD, 650 mg at 06/11/19 1228   ?  sodium chloride (NS) flush 5-40 mL, 5-40 mL, IntraVENous, Q8H, Lockard, Remi Deter, NP, 10 mL at 06/10/19 2115   ?  sodium chloride (NS) flush 5-40 mL, 5-40 mL, IntraVENous, PRN, Lockard, Remi Deter, NP   ?  polyethylene glycol (MIRALAX) packet 17 g, 17 g, Oral, DAILY PRN, Lockard, Remi Deter, NP   ?  promethazine (PHENERGAN) tablet 12.5 mg, 12.5 mg, Oral, Q6H PRN **OR** ondansetron (ZOFRAN) injection 4 mg, 4 mg, IntraVENous, Q6H PRN, Lockard, Remi Deter, NP, 4 mg at 06/05/19 1320      Interval summary:   Patient was admitted to the intensive care unit on 06/02/2019 after presenting to the emergency department with acute kidney injury, hypernatremia.  Patient had been recently diagnosed with COVID-19  on  05/18/2019, and tested positive again on 06/02/2019.  Per emergency department records, P "He has had a gradual decline, very poor p.o. intake to the point that he has not been eating or drinking for several days. ??He also is somnolent and less interactive.  ??He has not fallen or had recurrent fevers".  Patient was also hypothermic and hypotensive at admission.  There was concern for sepsis,  and he was started on IV fluids, and initial antibiotics IV cefepime and IV vancomycin.  Mild coffee-ground emesis was noted via nasogastric tube and gastroenterologist was consulted.  They recommended supportive care with either PPI twice daily or Pepcid  twice daily, and felt that EGD was not indicated.  He has been followed by infectious disease specialist.  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  However,  cultures on 05/17/2019 from the wound was positive for light E.cloacae complex, Providencia rettgeri sensitive to cefepime.    ID recommended Zyvox instead of vancomycin due to the potential contribution to nephrotoxicity.  IV Flagyl was added for concern for aspiration, and continued until 06/08/2019.  It was documented by  ID on 06/06/2019 that  organisms in wound culture were probably colonization. urine cultures came back positive for  Pseudomonas and ecoli. Infectious disease specialist has been following throughout, and commended to continue IV cefepime for 1 week, stopping  on the night of 06/09/2019.         06/10/2019: Patient's only complaint is he needs mac and cheese, potato chips and pepsi. He said sugar  Has nothing to do with it. He is happy about the basket his daughter brought him.         Subjective:        Patient has no complaints at this time, except for feeling weak.  He was living at home with family, and in this home, there are stairs which states that he certainly cannot manage at this time.  Patient  states  that he usually ambulates without any assistive devices.  Patient is interested in short-term inpatient rehab possibly skilled nursing facility  long-term.  Patient states that he sometimes has pain in the right foot, but it is much improved.  Patient  denies any pain anywhere else, nausea, vomiting, shortness of breath.  Patient has right plantar ulcer which is clean-based on exam.  Patient has not had any fever per review of vital signs for the last 48 hours.        Objective:          Vitals:             06/09/19 0707  06/09/19 1113  06/09/19 1304  06/09/19 1442           BP:  (!) 107/52  125/63  119/69  111/61     Pulse:  60  67  69  61     Resp:  _0 Temp:  98 ??F (36.7 ??C)  97.8 ??F (36.6 ??C)  98.2 ??F (36.8 ??C)  98.6 ??F (37 ??C)     SpO2:  98%  97%  98%  96%     Oxygen therapy   room air   room air   room air   room air            Intake and Output:   Current Shift: No intake/output data recorded.   Last three shifts: No intake/output data recorded.      Physical Exam:    Estimated body mass index is 27.66 kg/m?? as calculated from the following:     Height as of this encounter: _1  (1.778 m).     Weight as of this encounter: 87.5 kg (192 lb 12.8 oz).    General: In no acute distress.  Well developed, well nourished.  Head: Normocephalic, atraumatic.  Eyes: Anicteric sclera.  PERRL.  Extraocular muscles intact.  ENT: External ears and nose appear normal.  Oral mucosa moist.  Neck: Supple.   No jugular venous distention.   Heart: Regular rate and rhythm.  No murmurs appreciated.  Chest: Symmetrical excursion.  Clear to auscultation bilaterally.  Abdomen: Soft, nontender.  No abnormal distention. Bowel sounds are present throughout.  Extremities: No gross deformities.   No particular edema, no cyanosis.  Feet are warm to touch.  Neurological: Being all extremities spontaneously with at least antigravity strength.  Alert, oriented X3.  Skin: No jaundice.  Patient has a clean-based quarter sized ulcer  about 1  cm deep on the plantar aspect of his right foot at the location of the second metatarsal.         Lab/Data Review:     Recent Results (from the past 24 hour(s))     LACTIC ACID          Collection Time: 06/09/19 12:45 AM         Result  Value  Ref Range            Lactic acid  2.0  0.4 - 2.0 MMOL/L       MAGNESIUM          Collection Time: 06/09/19 12:45 AM         Result  Value  Ref Range            Magnesium  1.9  1.6 - 2.4 mg/dL       PHOSPHORUS          Collection Time: 06/09/19 12:45 AM         Result  Value  Ref Range  Phosphorus  2.0 (L)  2.6 - 4.7 MG/DL       METABOLIC PANEL, BASIC          Collection Time: 06/09/19 12:45 AM         Result  Value  Ref Range            Sodium  141  136 - 145 mmol/L       Potassium  4.3  3.5 - 5.1 mmol/L       Chloride  115 (H)  97 - 108 mmol/L       CO2  18 (L)  21 - 32 mmol/L       Anion gap  8  5 - 15 mmol/L       Glucose  155 (H)  65 - 100 mg/dL       BUN  18  6 - 20 MG/DL       Creatinine  1.33 (H)  0.70 - 1.30 MG/DL       BUN/Creatinine ratio  14  12 - 20         GFR est AA  >60  >60 ml/min/1.77m       GFR est non-AA  51 (L)  >60 ml/min/1.767m      Calcium  8.3 (L)  8.5 - 10.1 MG/DL       CBC W/O DIFF          Collection Time: 06/09/19 12:45 AM         Result  Value  Ref Range            WBC  7.4  4.1 - 11.1 K/uL       RBC  3.28 (L)  4.10 - 5.70 M/uL       HGB  9.2 (L)  12.1 - 17.0 g/dL       HCT  29.1 (L)  36.6 - 50.3 %       MCV  88.7  80.0 - 99.0 FL       MCH  28.0  26.0 - 34.0 PG       MCHC  31.6  30.0 - 36.5 g/dL       RDW  17.3 (H)  11.5 - 14.5 %       PLATELET  145 (L)  150 - 400 K/uL       MPV  12.5  8.9 - 12.9 FL       NRBC  0.0  0 PER 100 WBC       ABSOLUTE NRBC  0.00  0.00 - 0.01 K/uL       GLUCOSE, POC          Collection Time: 06/09/19  8:11 AM         Result  Value  Ref Range            Glucose (POC)  126 (H)  65 - 100 mg/dL       Performed by  [facility]        GLUCOSE, POC          Collection Time: 06/09/19 11:14 AM          Result  Value  Ref Range            Glucose (POC)  172 (H)  65 - 100 mg/dL       Performed by  [facility]        GLUCOSE, POC  Collection Time: 06/09/19  4:15 PM         Result  Value  Ref Range            Glucose (POC)  141 (H)  65 - 100 mg/dL            Performed by  [place]                   Signed By:  Sue Lush, MD           June 11, 2019

## 2019-06-11 NOTE — Progress Notes (Signed)
 Problem: Mobility Impaired (Adult and Pediatric)  Goal: *Acute Goals and Plan of Care (Insert Text)  Description: FUNCTIONAL STATUS PRIOR TO ADMISSION: Patient was modified independent using a single point cane for functional mobility.    HOME SUPPORT PRIOR TO ADMISSION: The patient lived with granddaughter and family but did not require assist until recently.     Physical Therapy Goals  Initiated 06/06/2019  1.  Patient will move from supine to sit and sit to supine , scoot up and down, and roll side to side in bed with modified independence within 7 day(s).    2.  Patient will transfer from bed to chair and chair to bed with supervision/set-up using the least restrictive device within 7 day(s).  3.  Patient will perform sit to stand with supervision/set-up within 7 day(s).  4.  Patient will ambulate with minimal assistance/contact guard assist for 150 feet with the least restrictive device within 7 day(s).   5.  Patient will ascend/descend 2 stairs with one handrail(s) with minimal assistance/contact guard assist within 7 day(s).      Outcome: Progressing Towards Goal     PHYSICAL THERAPY TREATMENT  Patient: Michael Lozano (85 y.o. male)  Date: 06/11/2019  Diagnosis: AKI (acute kidney injury) (HCC) [N17.9]  Hypernatremia [E87.0]  GI bleed [K92.2] <principal problem not specified>       Precautions: PWB(RLE in off loading shoe)  Chart, physical therapy assessment, plan of care and goals were reviewed.    ASSESSMENT  Patient continues with skilled PT services and is slowly progressing towards goals however remains irritable and noncompliant. Pt received in bed c/o not being fed, noted lunch tray 100% completed, three empty ice cream containers. Pt admits to ice cream but denies that he has been fed lunch, stating someone else must have eaten it. Pt participates in standing however noncompliant with PWB and shoe, states I have been doing this for years and its fine and does not follow cues to sit down and place  shoe. Pt ambulates to chair and begins c/o needing food again, looks through his basket and begins eating. Pt given all needs requested, refuses further mobility and TE.    Current Level of Function Impacting Discharge (mobility/balance): min A     Other factors to consider for discharge: patient noncompliant         PLAN :  Patient continues to benefit from skilled intervention to address the above impairments.  Continue treatment per established plan of care.  to address goals.    Recommendation for discharge: (in order for the patient to meet his/her long term goals)  Therapy up to 5 days/week in SNF setting    This discharge recommendation:  Has been made in collaboration with the attending provider and/or case management    IF patient discharges home will need the following DME: rolling walker       SUBJECTIVE:   Patient stated "you wanted to walk, so im walking, leave me alone."    OBJECTIVE DATA SUMMARY:   Critical Behavior:  Neurologic State: Alert, Irritable  Orientation Level: Disoriented to situation, Disoriented to time, Oriented to person, Oriented to place  Cognition: Decreased attention/concentration, Decreased command following, Impaired decision making, Impulsive, Poor safety awareness, Memory loss  Safety/Judgement: Decreased awareness of environment, Decreased awareness of need for assistance, Decreased insight into deficits, Decreased awareness of need for safety  Functional Mobility Training:  Bed Mobility:     Supine to Sit: Supervision  Sit to Supine: Supervision  Scooting: Supervision  Transfers:  Sit to Stand: Supervision  Stand to Sit: Supervision                             Balance:  Sitting: Intact  Standing: Intact;With support(not following commands from PT for weight bearing)  Standing - Static: Constant support;Fair  Standing - Dynamic : Constant support;Fair  Ambulation/Gait Training:  Distance (ft): 25 Feet (ft)  Assistive Device: Gait belt;Orthotic device;Walker,  rolling  Ambulation - Level of Assistance: Contact guard assistance        Gait Abnormalities: Shuffling gait  Right Side Weight Bearing: Partial (%)(in offloading shoe, noncompliant)     Base of Support: Widened     Speed/Cadence: Shuffled;Slow  Step Length: Right shortened;Left shortened    Pain Rating:  Denies pain, c/o hunger    Activity Tolerance:   Fair and requires rest breaks    After treatment patient left in no apparent distress:   Sitting in chair, Call bell within reach, and Bed / chair alarm activated    COMMUNICATION/COLLABORATION:   The patient's plan of care was discussed with: Registered nurse.     Asberry PARAS Hutcherson, PT   Time Calculation: 19 mins

## 2019-06-11 NOTE — Progress Notes (Signed)
Transition of Care: TBD; likely a SNF; [facility] has accepted; awaiting insurance [employer], [employer] Plan: likely BLS (not set up yet)    RUR: 25%    Main contact- daughterBonita Quin- [phone]    Patient is covid positive; need a SNF;     0930: this CM called Diane at [facility] to inform of patient needing SNF; left message; waiting for call back    1000: Stacy from [facility] has called and accepted patient pending insurance auth  1145: this CM called patients daughter, Delorise Jackson [phone] to inform of discharge plan; she verbalized understanding    1500: Diane at [facility] called back; patient is not a [facility] patient        CM following  Fernand Parkins, Charity fundraiser, CRM

## 2019-06-12 LAB — GLUCOSE, POC
Glucose (POC): 75 mg/dL (ref 65–100)
Glucose (POC): 115 mg/dL — ABNORMAL HIGH (ref 65–100)
Glucose (POC): 126 mg/dL — ABNORMAL HIGH (ref 65–100)
Glucose (POC): 130 mg/dL — ABNORMAL HIGH (ref 65–100)

## 2019-06-12 LAB — POCT GLUCOSE
POC Glucose: 75 mg/dL (ref 65–100)
POC Glucose: 115 mg/dL — ABNORMAL HIGH (ref 65–100)
POC Glucose: 126 mg/dL — ABNORMAL HIGH (ref 65–100)
POC Glucose: 130 mg/dL — ABNORMAL HIGH (ref 65–100)

## 2019-06-12 MED FILL — GABAPENTIN 300 MG CAP: 300 mg | ORAL | Qty: 1

## 2019-06-12 MED FILL — DOK 100 MG CAPSULE: 100 mg | ORAL | Qty: 1

## 2019-06-12 MED FILL — SUCRALFATE 1 GRAM TAB: 1 gram | ORAL | Qty: 1

## 2019-06-12 MED FILL — CHOLECALCIFEROL (VITAMIN D3) 1,000 UNIT (25 MCG) TAB: ORAL | Qty: 2

## 2019-06-12 MED FILL — ACETAMINOPHEN 325 MG TABLET: 325 mg | ORAL | Qty: 2

## 2019-06-12 MED FILL — PANTOPRAZOLE 40 MG TAB, DELAYED RELEASE: 40 mg | ORAL | Qty: 1

## 2019-06-12 MED FILL — ZINC-220 50 MG ZINC (220 MG) CAPSULE: 50 mg zinc (220 mg) | ORAL | Qty: 1

## 2019-06-12 NOTE — Progress Notes (Signed)
Problem: Self Care Deficits Care Plan (Adult)  Goal: *Acute Goals and Plan of Care (Insert Text)  Description:   FUNCTIONAL STATUS PRIOR TO ADMISSION: Patient was modified independent using a cane for functional mobility, was modified independent for basic and instrumental ADLs however increased debility over the past couple of weeks requiring increased assist for ADLs/IADLs and mobility.     HOME SUPPORT: The patient lived with granddaughter and her significant other but did not require assist until recently.    Occupational Therapy Goals  Weekly Re-Assessment 06/12/19, goals modified below  Initiated 06/06/2019  1.  Patient will perform grooming at the sink with minimal assistance/contact guard assist within 7 day(s). UPGRADED to SBA 4/27  2.  Patient will perform bathing with minimal assistance/contact guard assist within 7 day(s). CONTINUE  3.  Patient will perform lower body dressing with minimal assistance/contact guard assist within 7 day(s). MET, UPGRADED to SBA 4/27  4.  Patient will perform toilet transfers with minimal assistance/contact guard assist within 7 day(s). MET, UPGRADED to SBA 4/27  5.  Patient will perform all aspects of toileting with minimal assistance/contact guard assist within 7 day(s). MET, UPGRADED to SBA 4/27  6.  Patient will participate in upper extremity therapeutic exercise/activities with supervision/set-up for 5 minutes within 7 day(s).  CONTINUE  7.  Patient will utilize energy conservation techniques during functional activities with verbal and visual cues within 7 day(s). CONTINUE            Outcome: Progressing Towards Goal   OCCUPATIONAL THERAPY TREATMENT/WEEKLY RE-ASSESSMENT  Patient: Michael Lozano (85 y.o. male)  Date: 06/12/2019  Diagnosis: AKI (acute kidney injury) ([place]) [N17.9]  Hypernatremia [E87.0]  GI bleed [K92.2] <principal problem not specified>       Precautions: PWB(RLE in off loading shoe)  Chart, occupational therapy assessment, plan of care, and goals were  reviewed.    ASSESSMENT  Patient continues with skilled OT services and is progressing towards goals, 4/7 upgraded above, however remains limited by decreased balance, activity tolerance, volition, safety awareness, cognition, insight into deficits, and strength. He continues with slight irritability & perseveration on food, requiring increased time and education for functional activity, largely SBA-CGA for limited mobility with RW support and R post-op shoe with fair-poor RLE PWB carryover. He continues to require up to [facility] for distal lower body dressing with MAX education & cues for safety awareness d/t impulsivity & poor insight. Continue to recommend d/c to SNF to maximize safety & functional independence.      Current Level of Function Impacting Discharge (ADLs): SPV-Min A for ADLs and mobility with RW    Other factors to consider for discharge: fall risk, COVID+, lives with granddaughter who also had COVID, high PLOF         PLAN :  Goals have been updated based on progression since last assessment. Patient continues to benefit from skilled intervention to address the above impairments. Continue to follow patient 3 times a week to address goals.    Recommend with staff: Recommend with nursing, ADLs with assist PRN, OOB to chair 3x/day via walker and toileting via functional mobility to and from bathroom. Thank you for completing as able in order to maintain patient strength, endurance and independence.     Recommendation for discharge: (in order for the patient to meet his/her long term goals)  Therapy up to 5 days/week in SNF setting    This discharge recommendation:  Has been made in collaboration with the attending provider and/or case  management    IF patient discharges home will need the following DME: To be determined (TBD)         SUBJECTIVE:   Patient stated ???You got any macaroni and cheese, I haven't eaten in forever.???    OBJECTIVE DATA SUMMARY:   Cognitive/Behavioral Status:  Neurologic State:  Alert;Agitated  Orientation Level: Oriented to person;Disoriented to place;Disoriented to situation;Disoriented to time  Cognition: Decreased attention/concentration;Decreased command following;Impaired decision making;Impulsive;Poor safety awareness;Memory loss  Perception: Appears intact  Perseveration: Perseverates during conversation(wanting to eat, hungry)  Safety/Judgement: Decreased awareness of environment;Decreased awareness of need for assistance;Decreased awareness of need for safety;Lack of insight into deficits    Functional Mobility and Transfers for ADLs:  Bed Mobility:  Supine to Sit: Supervision  Sit to Supine: Supervision  Scooting: Supervision    Transfers:  Sit to Stand: Stand-by assistance;Contact guard assistance(icnr A with ambulation)  Functional Transfers  Bathroom Mobility: Contact guard assistance  Toilet Transfer : Contact guard assistance       Balance:  Sitting: Intact  Standing: Impaired;With support(RW, R post-op shoe)  Standing - Static: Good  Standing - Dynamic : Good;Fair    ADL Intervention:  Lower Body Dressing Assistance  Dressing Assistance: Minimum assistance  Pants With Elastic Waist: Contact guard assistance(simulated)  Shoes with Velcro: Minimum assistance(w/ R post-op shoe)  Leg Crossed Method Used: Yes  Position Performed: Seated edge of bed;Standing  Cues: Verbal cues provided;Tactile cues provided;Visual cues provided    Toileting  Toileting Assistance: Contact guard assistance(simulated in bathroom)  Bladder Hygiene: Supervision  Bowel Hygiene: Contact guard assistance  Clothing Management: Contact guard assistance  Cues: Tactile cues provided;Visual cues provided;Verbal cues provided  Adaptive Equipment: Grab bars;Walker    Cognitive Retraining  Safety/Judgement: Decreased awareness of environment;Decreased awareness of need for assistance;Decreased awareness of need for safety;Lack of insight into deficits    Therapeutic Exercises:   Ambulation to door & in/out of  bathroom with up to CGA and RW support, mod cues for safety awareness    Pain:  L foot pain reported    Activity Tolerance:   Fair    After treatment patient left in no apparent distress:   Supine in bed, Call bell within reach, Bed / chair alarm activated, and Side rails x 3    COMMUNICATION/COLLABORATION:   The patient???s plan of care was discussed with: Physical therapist and Registered nurse.     Tawni Millers, OTD, OTR/L  Time Calculation: 24 mins

## 2019-06-12 NOTE — Progress Notes (Addendum)
Transition of Care: TBD; needs a SNF; [facility] has accepted; insurance policy is questionable at this time; apparently  patients [employer] policy expired on 3/31 and his [employer] coverage is not valid; CM team lead is aware and working on this  ??  Transport Plan: likely BLS (not set up yet)  ??  RUR: 25%  ??  Main contact- daughter- Linda- [phone]  ??  Patient is covid positive; need a SNF  ??  1300: Stacy at [facility] has called to give correct insurance information- patient has [employer] Medicare- [id number] per patients daughter     1300: this CM started insurance auth with [employer]; when this CM called [employer] number/policy has expired as of 05/16/19; called patients daughter to explain; she is calling [employer] now to work it out  ??  ??  CM following  Cary Butcher, RN, CRM

## 2019-06-12 NOTE — Progress Notes (Signed)
Progress Note    Patient: Michael Lozano MRN: [id number]  SSN: [id number]    Date of Birth: [birth year]  Age: 85 y.o.  Sex: male      Admit Date: 06/02/2019    LOS: 10 days      Awaiting insurance Authorization for placement at [facility]    06/12/2019: Patient till complaining of not enough food. He has been snacking out of a large figt box of food from his daughter      Assessment/plan:   1.  Generalized weakness/deconditioning.  Case management has been consulted for rehab placement.  2.  Recent COVID-19 virus infection.  Patient remains in contact and droplet isolation. Has taste changes and is frustrated with it  3.  Diabetes mellitus.  Currently on sliding scale insulin. Daughter sent a huge basket of food shich he has been snacking out of. His Glucose remains irregular as a result  4.  Acute kidney injury present on admission is now resolved.  Creatinine appears to be close to baseline.  5.  Diabetic neuropathy.  Continue gabapentin.      Current Facility-Administered Medications:   ???  ammonium lactate (LAC-HYDRIN) 12 % lotion, , Topical, DAILY, Pervis Hocking, MD, Given at 06/12/19 1000  ???  albumin human 25% (BUMINATE) solution 12.5 g, 12.5 g, IntraVENous, Q8H PRN, Posey Pronto, Deep V, MD  ???  insulin lispro (HUMALOG) injection, , SubCUTAneous, AC&HS, Meyer Russel, NP-C, Stopped at 06/11/19 1630  ???  gabapentin (NEURONTIN) capsule 300 mg, 300 mg, Oral, BID, Theodore Demark R, NP-C, 300 mg at 06/12/19 0959  ???  zinc sulfate (ZINCATE) 50 mg zinc (220 mg) capsule 1 Cap, 1 Cap, Oral, DAILY, Theodore Demark R, NP-C, 1 Cap at 06/12/19 0932  ???  pantoprazole (PROTONIX) tablet 40 mg, 40 mg, Oral, BID, Theodore Demark R, NP-C, 40 mg at 06/12/19 3557  ???  cholecalciferol (VITAMIN D3) (1000 Units /25 mcg) tablet 2,000 Units, 2,000 Units, Oral, DAILY, Theodore Demark R, NP-C, 2,000 Units at 06/12/19 0959  ???  sucralfate (CARAFATE) tablet 1 g, 1 g, Oral, AC&HS, Theodore Demark R, NP-C, 1 g at 06/12/19 0753  ???  docusate sodium  (COLACE) capsule 100 mg, 100 mg, Oral, DAILY, Theodore Demark R, NP-C, 100 mg at 06/12/19 0959  ???  HYDROmorphone (PF) (DILAUDID) injection 0.5 mg, 0.5 mg, IntraVENous, Q4H PRN, Arta Silence, NP, 0.5 mg at 06/05/19 0835  ???  dextrose 5% infusion, 50 mL/hr, IntraVENous, CONTINUOUS, Patel, Deep V, MD, Last Rate: 50 mL/hr at 06/12/19 0507, 50 mL/hr at 06/12/19 0507  ???  albuterol (PROVENTIL HFA, VENTOLIN HFA, PROAIR HFA) inhaler 1 Puff, 1 Puff, Inhalation, Q4H PRN, Erline Levine, NP  ???  glucose chewable tablet 16 g, 4 Tab, Oral, PRN, Erline Levine, NP  ???  dextrose (D50W) injection syrg 12.5-25 g, 25-50 mL, IntraVENous, PRN, Erline Levine, NP  ???  glucagon (GLUCAGEN) injection 1 mg, 1 mg, IntraMUSCular, PRN, Erline Levine, NP  ???  acetaminophen (TYLENOL) tablet 650 mg, 650 mg, Oral, Q6H PRN, 650 mg at 06/11/19 2153 **OR** acetaminophen (TYLENOL) suppository 650 mg, 650 mg, Rectal, Q6H PRN, Arta Silence, NP  ???  acetaminophen (TYLENOL) tablet 650 mg, 650 mg, Oral, Q6H, Sinnatamby, Marylu Lund, MD, 650 mg at 06/12/19 0748  ???  sodium chloride (NS) flush 5-40 mL, 5-40 mL, IntraVENous, Q8H, Lockard, Sara H, NP, 10 mL at 06/12/19 0600  ???  sodium chloride (NS) flush 5-40 mL, 5-40 mL, IntraVENous, PRN, Lockard, Remi Deter,  NP  ???  polyethylene glycol (MIRALAX) packet 17 g, 17 g, Oral, DAILY PRN, Lockard, Remi Deter, NP  ???  promethazine (PHENERGAN) tablet 12.5 mg, 12.5 mg, Oral, Q6H PRN **OR** ondansetron (ZOFRAN) injection 4 mg, 4 mg, IntraVENous, Q6H PRN, Lockard, Remi Deter, NP, 4 mg at 06/05/19 1320    Interval summary:  Patient was admitted to the intensive care unit on 06/02/2019 after presenting to the emergency department with acute kidney injury, hypernatremia.  Patient had been recently diagnosed with COVID-19 on 05/18/2019, and tested positive again on 06/02/2019.  Per emergency department records, P "He has had a gradual decline, very poor p.o. intake to the point that he has not been eating or drinking for several days. ??He also is  somnolent and less interactive. ??He has not fallen or had recurrent fevers".  Patient was also hypothermic and hypotensive at admission.  There was concern for sepsis, and he was started on IV fluids, and initial antibiotics IV cefepime and IV vancomycin.  Mild coffee-ground emesis was noted via nasogastric tube and gastroenterologist was consulted.  They recommended supportive care with either PPI twice daily or Pepcid twice daily, and felt that EGD was not indicated.  He has been followed by infectious disease specialist.  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  However, cultures on 05/17/2019 from the wound was positive for light E.cloacae complex, Providencia rettgeri sensitive to cefepime.   ID recommended Zyvox instead of vancomycin due to the potential contribution to nephrotoxicity.  IV Flagyl was added for concern for aspiration, and continued until 06/08/2019.  It was documented by ID on 06/06/2019 that  organisms in wound culture were probably colonization. urine cultures came back positive for  Pseudomonas and ecoli. Infectious disease specialist has been following throughout, and commended to continue IV cefepime for 1 week, stopping on the night of 06/09/2019.      06/10/2019: Patient's only complaint is he needs mac and cheese, potato chips and pepsi. He said sugar  Has nothing to do with it. He is happy about the basket his daughter brought him.     Subjective:     Patient has no complaints at this time, except for feeling weak.  He was living at home with family, and in this home, there are stairs which states that he certainly cannot manage at this time.  Patient states that he usually ambulates without any assistive devices.  Patient is interested in short-term inpatient rehab possibly skilled nursing facility long-term.  Patient states that he sometimes has pain in the right foot, but it is much improved.   Patient denies any pain anywhere else, nausea, vomiting, shortness of breath.  Patient has right plantar ulcer which is clean-based on exam.  Patient has not had any fever per review of vital signs for the last 48 hours.    Objective:     Vitals:    06/09/19 0707 06/09/19 1113 06/09/19 1304 06/09/19 1442   BP: (!) 107/52 125/63 119/69 111/61   Pulse: 60 67 69 61   Resp: _0 Temp: 98 ??F (36.7 ??C) 97.8 ??F (36.6 ??C) 98.2 ??F (36.8 ??C) 98.6 ??F (37 ??C)   SpO2: 98% 97% 98% 96%   Oxygen therapy  room air  room air  room air  room air        Intake and Output:  Current Shift: No intake/output data recorded.  Last three shifts: No intake/output data recorded.    Physical Exam:   Estimated body mass index is 27.66 kg/m?? as calculated from the following:    Height as of this encounter: _0  (1.778 m).    Weight as of this encounter: 87.5 kg (192 lb 12.8 oz).   General: In no acute distress.  Well developed, well nourished.  Head: Normocephalic, atraumatic.  Eyes: Anicteric sclera.  PERRL.  Extraocular muscles intact.  ENT: External ears and nose appear normal.  Oral mucosa moist.  Neck: Supple.  No jugular venous distention.  Heart: Regular rate and rhythm.  No murmurs appreciated.  Chest: Symmetrical excursion.  Clear to auscultation bilaterally.  Abdomen: Soft, nontender.  No abnormal distention. Bowel sounds are present throughout.  Extremities: No gross deformities.  No particular edema, no cyanosis.  Feet are warm to touch.  Neurological: Being all extremities spontaneously with at least antigravity strength.  Alert, oriented X3.  Skin: No jaundice.  Patient has a clean-based quarter sized ulcer about 1 cm deep on the plantar aspect of his right foot at the location of the second metatarsal.      Lab/Data Review:  Recent Results (from the past 24 hour(s))   LACTIC ACID    Collection Time: 06/09/19 12:45 AM   Result Value Ref Range    Lactic acid 2.0 0.4 - 2.0 MMOL/L   MAGNESIUM    Collection Time: 06/09/19  12:45 AM   Result Value Ref Range    Magnesium 1.9 1.6 - 2.4 mg/dL   PHOSPHORUS    Collection Time: 06/09/19 12:45 AM   Result Value Ref Range    Phosphorus 2.0 (L) 2.6 - 4.7 MG/DL   METABOLIC PANEL, BASIC    Collection Time: 06/09/19 12:45 AM   Result Value Ref Range    Sodium 141 136 - 145 mmol/L    Potassium 4.3 3.5 - 5.1 mmol/L    Chloride 115 (H) 97 - 108 mmol/L    CO2 18 (L) 21 - 32 mmol/L    Anion gap 8 5 - 15 mmol/L    Glucose 155 (H) 65 - 100 mg/dL    BUN 18 6 - 20 MG/DL    Creatinine 1.33 (H) 0.70 - 1.30 MG/DL    BUN/Creatinine ratio 14 12 - 20      GFR est AA >60 >60 ml/min/1.4m    GFR est non-AA 51 (L) >60 ml/min/1.765m   Calcium 8.3 (L) 8.5 - 10.1 MG/DL   CBC W/O DIFF    Collection Time: 06/09/19 12:45 AM   Result Value Ref Range    WBC 7.4 4.1 - 11.1 K/uL    RBC 3.28 (L) 4.10 - 5.70 M/uL    HGB 9.2 (L) 12.1 - 17.0 g/dL    HCT 29.1 (L) 36.6 - 50.3 %    MCV 88.7 80.0 - 99.0 FL    MCH 28.0 26.0 - 34.0 PG    MCHC 31.6 30.0 - 36.5 g/dL    RDW 17.3 (H) 11.5 - 14.5 %    PLATELET 145 (L) 150 - 400 K/uL    MPV 12.5 8.9 - 12.9 FL    NRBC 0.0 0 PER 100 WBC    ABSOLUTE NRBC 0.00 0.00 - 0.01 K/uL   GLUCOSE, POC    Collection Time: 06/09/19  8:11 AM   Result Value Ref Range    Glucose (POC) 126 (H) 65 - 100 mg/dL    Performed by [facility]  GLUCOSE, POC    Collection Time: 06/09/19 11:14 AM   Result Value Ref Range    Glucose (POC) 172 (H) 65 - 100 mg/dL    Performed by [employer] [facility].    GLUCOSE, POC    Collection Time: 06/09/19  4:15 PM   Result Value Ref Range    Glucose (POC) 141 (H) 65 - 100 mg/dL    Performed by [place]          Signed By: Sue Lush, MD     June 12, 2019

## 2019-06-12 NOTE — Progress Notes (Signed)
 Transition of Care: TBD; needs a SNF; [facility] has accepted; insurance policy is questionable at this time; apparently  patients [employer] policy expired on 3/31 and his [employer] coverage is not valid; CM team lead is aware and working on this    Transport Plan: likely BLS (not set up yet)    RUR: 25%    Main contact- daughterGLENWOOD Handing- [phone]    Patient is covid positive; need a SNF    1300: Stacy at [facility] has called to give correct insurance information- patient has Mylene [phone] [id number] per patients daughter     47: this CM started insurance auth with [facility]; when this CM called [employer] number/policy has expired as of 05/16/19; called patients daughter to explain; she is calling [employer] now to work it out      CM following  Rodgers Mo[place], Charity fundraiser, CRM

## 2019-06-12 NOTE — Progress Notes (Signed)
Progress Notes by Sue Lush, MD at 06/12/19 1024                Author: Sue Lush, MD  Service: Hospitalist  Author Type: Physician       Filed: 06/12/19 1025  Date of Service: 06/12/19 1024  Status: Signed          Editor: Sue Lush, MD (Physician)                               Progress Note          Patient: Michael Lozano  MRN: [id number]    SSN: [id number]           Date of Birth: 1934/09/10    Age: 85 y.o.    Sex: male          Admit Date: 06/02/2019      LOS: 10 days        Awaiting insurance Authorization for placement at [facility]      06/12/2019: Patient till complaining of not enough food. He has been snacking out of a large figt box of food from his daughter           Assessment/plan :     1.  Generalized weakness/deconditioning.  Case management has been consulted for rehab placement.   2.  Recent COVID-19 virus infection.  Patient remains in contact and droplet isolation. Has taste changes and is frustrated with it   3.  Diabetes mellitus.  Currently on sliding scale insulin. Daughter sent a huge basket of food shich he has been snacking out of. His Glucose remains irregular as a result   4.  Acute kidney injury present on admission is now resolved.  Creatinine appears to be close to baseline.   5.  Diabetic neuropathy.  Continue gabapentin.         Current Facility-Administered Medications:    ?  ammonium lactate (LAC-HYDRIN) 12 % lotion, , Topical, DAILY, Pervis Hocking, MD, Given at 06/12/19 1000   ?  albumin human 25% (BUMINATE) solution 12.5 g, 12.5 g, IntraVENous, Q8H PRN, Patel, Deep V, MD   ?  insulin lispro (HUMALOG) injection, , SubCUTAneous, AC&HS, Theodore Demark R, NP-C, Stopped at 06/11/19 1630   ?  gabapentin (NEURONTIN) capsule 300 mg, 300 mg, Oral, BID, Theodore Demark R, NP-C, 300 mg at 06/12/19 2706   ?  zinc sulfate (ZINCATE) 50 mg zinc (220 mg) capsule 1 Cap, 1 Cap, Oral, DAILY, Theodore Demark R, NP-C, 1 Cap at 06/12/19 2376   ?  pantoprazole (PROTONIX)  tablet 40 mg, 40 mg, Oral, BID, Theodore Demark R, NP-C, 40 mg at 06/12/19 2831   ?  cholecalciferol (VITAMIN D3) (1000 Units /25 mcg) tablet 2,000 Units, 2,000 Units, Oral, DAILY, Theodore Demark R, NP-C, 2,000 Units at 06/12/19 5176   ?  sucralfate (CARAFATE) tablet 1 g, 1 g, Oral, AC&HS, Theodore Demark R, NP-C, 1 g at 06/12/19 0753   ?  docusate sodium (COLACE) capsule 100 mg, 100 mg, Oral, DAILY, Theodore Demark R, NP-C, 100 mg at 06/12/19 0959   ?  HYDROmorphone (PF) (DILAUDID) injection 0.5 mg, 0.5 mg, IntraVENous, Q4H PRN, Arta Silence, NP, 0.5 mg at 06/05/19 0835   ?  dextrose 5% infusion, 50 mL/hr, IntraVENous, CONTINUOUS, Patel, Deep V, MD, Last Rate: 50 mL/hr at 06/12/19 0507, 50 mL/hr at 06/12/19 0507   ?  albuterol (PROVENTIL HFA,  VENTOLIN HFA, PROAIR HFA) inhaler 1 Puff, 1 Puff, Inhalation, Q4H PRN, Erline Levine, NP   ?  glucose chewable tablet 16 g, 4 Tab, Oral, PRN, Erline Levine, NP   ?  dextrose (D50W) injection syrg 12.5-25 g, 25-50 mL, IntraVENous, PRN, Erline Levine, NP   ?  glucagon (GLUCAGEN) injection 1 mg, 1 mg, IntraMUSCular, PRN, Erline Levine, NP   ?  acetaminophen (TYLENOL) tablet 650 mg, 650 mg, Oral, Q6H PRN, 650 mg at 06/11/19 2153 **OR** acetaminophen (TYLENOL) suppository 650 mg, 650 mg, Rectal, Q6H PRN, Arta Silence, NP   ?  acetaminophen (TYLENOL) tablet 650 mg, 650 mg, Oral, Q6H, Sinnatamby, Marylu Lund, MD, 650 mg at 06/12/19 0748   ?  sodium chloride (NS) flush 5-40 mL, 5-40 mL, IntraVENous, Q8H, Lockard, Sara H, NP, 10 mL at 06/12/19 0600   ?  sodium chloride (NS) flush 5-40 mL, 5-40 mL, IntraVENous, PRN, Lockard, Remi Deter, NP   ?  polyethylene glycol (MIRALAX) packet 17 g, 17 g, Oral, DAILY PRN, Lockard, Remi Deter, NP   ?  promethazine (PHENERGAN) tablet 12.5 mg, 12.5 mg, Oral, Q6H PRN **OR** ondansetron (ZOFRAN) injection 4 mg, 4 mg, IntraVENous, Q6H PRN, Lockard, Remi Deter, NP, 4 mg at 06/05/19 1320      Interval summary:   Patient was admitted to the intensive care unit on  06/02/2019 after presenting to the emergency department with acute kidney injury, hypernatremia.  Patient had been recently diagnosed with COVID-19  on 05/18/2019, and tested positive again on 06/02/2019.  Per emergency department records, P "He has had a gradual decline, very poor p.o. intake to the point that he has not been eating or drinking for several days. ??He also is somnolent and less interactive.  ??He has not fallen or had recurrent fevers".  Patient was also hypothermic and hypotensive at admission.  There was concern for sepsis,  and he was started on IV fluids, and initial antibiotics IV cefepime and IV vancomycin.  Mild coffee-ground emesis was noted via nasogastric tube and gastroenterologist was consulted.  They recommended supportive care with either PPI twice daily or Pepcid  twice daily, and felt that EGD was not indicated.  He has been followed by infectious disease specialist.  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  However,  cultures on 05/17/2019 from the wound was positive for light E.cloacae complex, Providencia rettgeri sensitive to cefepime.    ID recommended Zyvox instead of vancomycin due to the potential contribution to nephrotoxicity.  IV Flagyl was added for concern for aspiration, and continued until 06/08/2019.  It was documented by  ID on 06/06/2019 that  organisms in wound culture were probably colonization. urine cultures came back positive for  Pseudomonas and ecoli. Infectious disease specialist has been following throughout, and commended to continue IV cefepime for 1 week, stopping  on the night of 06/09/2019.         06/10/2019: Patient's only complaint is he needs mac and cheese, potato chips and pepsi. He said sugar  Has nothing to do with it. He is happy about the basket his daughter brought him.         Subjective:        Patient has no complaints at this time, except for feeling  weak.  He was living at home with family, and in this home, there are stairs which states that he certainly  cannot manage at this time.  Patient  states that he usually ambulates without any assistive devices.  Patient is interested in short-term inpatient rehab possibly skilled nursing facility long-term.  Patient states that he sometimes has pain in the right foot, but it is much improved.  Patient  denies any pain anywhere else, nausea, vomiting, shortness of breath.  Patient has right plantar ulcer which is clean-based on exam.  Patient has not had any fever per review of vital signs for the last 48 hours.        Objective:          Vitals:             06/09/19 0707  06/09/19 1113  06/09/19 1304  06/09/19 1442           BP:  (!) 107/52  125/63  119/69  111/61     Pulse:  60  67  69  61     Resp:  _0 Temp:  98 ??F (36.7 ??C)  97.8 ??F (36.6 ??C)  98.2 ??F (36.8 ??C)  98.6 ??F (37 ??C)     SpO2:  98%  97%  98%  96%     Oxygen therapy   room air   room air   room air   room air            Intake and Output:   Current Shift: No intake/output data recorded.   Last three shifts: No intake/output data recorded.      Physical Exam:    Estimated body mass index is 27.66 kg/m?? as calculated from the following:     Height as of this encounter: _1  (1.778 m).     Weight as of this encounter: 87.5 kg (192 lb 12.8 oz).    General: In no acute distress.  Well developed, well nourished.  Head: Normocephalic, atraumatic.  Eyes: Anicteric sclera.  PERRL.  Extraocular muscles intact.  ENT: External ears and nose appear normal.  Oral mucosa moist.  Neck: Supple.   No jugular venous distention.   Heart: Regular rate and rhythm.  No murmurs appreciated.  Chest: Symmetrical excursion.  Clear to auscultation bilaterally.  Abdomen: Soft, nontender.  No abnormal distention. Bowel sounds are present throughout.  Extremities: No gross deformities.   No particular edema, no cyanosis.  Feet are warm to touch.  Neurological: Being  all extremities spontaneously with at least antigravity strength.  Alert, oriented X3.  Skin: No jaundice.  Patient has a clean-based quarter sized ulcer about 1  cm deep on the plantar aspect of his right foot at the location of the second metatarsal.         Lab/Data Review:     Recent Results (from the past 24 hour(s))     LACTIC ACID          Collection Time: 06/09/19 12:45 AM         Result  Value  Ref Range            Lactic acid  2.0  0.4 - 2.0 MMOL/L       MAGNESIUM          Collection Time: 06/09/19 12:45 AM         Result  Value  Ref Range            Magnesium  1.9  1.6 - 2.4 mg/dL       PHOSPHORUS  Collection Time: 06/09/19 12:45 AM         Result  Value  Ref Range            Phosphorus  2.0 (L)  2.6 - 4.7 MG/DL       METABOLIC PANEL, BASIC          Collection Time: 06/09/19 12:45 AM         Result  Value  Ref Range            Sodium  141  136 - 145 mmol/L       Potassium  4.3  3.5 - 5.1 mmol/L       Chloride  115 (H)  97 - 108 mmol/L       CO2  18 (L)  21 - 32 mmol/L       Anion gap  8  5 - 15 mmol/L       Glucose  155 (H)  65 - 100 mg/dL       BUN  18  6 - 20 MG/DL       Creatinine  1.33 (H)  0.70 - 1.30 MG/DL       BUN/Creatinine ratio  14  12 - 20         GFR est AA  >60  >60 ml/min/1.73m       GFR est non-AA  51 (L)  >60 ml/min/1.769m      Calcium  8.3 (L)  8.5 - 10.1 MG/DL       CBC W/O DIFF          Collection Time: 06/09/19 12:45 AM         Result  Value  Ref Range            WBC  7.4  4.1 - 11.1 K/uL       RBC  3.28 (L)  4.10 - 5.70 M/uL       HGB  9.2 (L)  12.1 - 17.0 g/dL       HCT  29.1 (L)  36.6 - 50.3 %       MCV  88.7  80.0 - 99.0 FL       MCH  28.0  26.0 - 34.0 PG       MCHC  31.6  30.0 - 36.5 g/dL       RDW  17.3 (H)  11.5 - 14.5 %       PLATELET  145 (L)  150 - 400 K/uL       MPV  12.5  8.9 - 12.9 FL       NRBC  0.0  0 PER 100 WBC       ABSOLUTE NRBC  0.00  0.00 - 0.01 K/uL       GLUCOSE, POC          Collection Time: 06/09/19  8:11 AM         Result  Value  Ref Range             Glucose (POC)  126 (H)  65 - 100 mg/dL       Performed by  [facility]        GLUCOSE, POC          Collection Time: 06/09/19 11:14 AM         Result  Value  Ref Range            Glucose (POC)  172 (H)  65 - 100 mg/dL       Performed by  [facility].         GLUCOSE, POC          Collection Time: 06/09/19  4:15 PM         Result  Value  Ref Range            Glucose (POC)  141 (H)  65 - 100 mg/dL            Performed by  [place]                   Signed By:  Sue Lush, MD           June 12, 2019

## 2019-06-12 NOTE — Progress Notes (Signed)
 Problem: Self Care Deficits Care Plan (Adult)  Goal: *Acute Goals and Plan of Care (Insert Text)  Description:   FUNCTIONAL STATUS PRIOR TO ADMISSION: Patient was modified independent using a cane for functional mobility, was modified independent for basic and instrumental ADLs however increased debility over the past couple of weeks requiring increased assist for ADLs/IADLs and mobility.     HOME SUPPORT: The patient lived with granddaughter and her significant other but did not require assist until recently.    Occupational Therapy Goals  Weekly Re-Assessment 06/12/19, goals modified below  Initiated 06/06/2019  1.  Patient will perform grooming at the sink with minimal assistance/contact guard assist within 7 day(s). UPGRADED to SBA 4/27  2.  Patient will perform bathing with minimal assistance/contact guard assist within 7 day(s). CONTINUE  3.  Patient will perform lower body dressing with minimal assistance/contact guard assist within 7 day(s). MET, UPGRADED to SBA 4/27  4.  Patient will perform toilet transfers with minimal assistance/contact guard assist within 7 day(s). MET, UPGRADED to SBA 4/27  5.  Patient will perform all aspects of toileting with minimal assistance/contact guard assist within 7 day(s). MET, UPGRADED to SBA 4/27  6.  Patient will participate in upper extremity therapeutic exercise/activities with supervision/set-up for 5 minutes within 7 day(s).  CONTINUE  7.  Patient will utilize energy conservation techniques during functional activities with verbal and visual cues within 7 day(s). CONTINUE            Outcome: Progressing Towards Goal   OCCUPATIONAL THERAPY TREATMENT/WEEKLY RE-ASSESSMENT  Patient: Michael Lozano (85 y.o. male)  Date: 06/12/2019  Diagnosis: AKI (acute kidney injury) (HCC) [N17.9]  Hypernatremia [E87.0]  GI bleed [K92.2] <principal problem not specified>       Precautions: PWB(RLE in off loading shoe)  Chart, occupational therapy assessment, plan of care, and goals were  reviewed.    ASSESSMENT  Patient continues with skilled OT services and is progressing towards goals, 4/7 upgraded above, however remains limited by decreased balance, activity tolerance, volition, safety awareness, cognition, insight into deficits, and strength. He continues with slight irritability & perseveration on food, requiring increased time and education for functional activity, largely SBA-CGA for limited mobility with RW support and R post-op shoe with fair-poor RLE PWB carryover. He continues to require up to Min A for distal lower body dressing with MAX education & cues for safety awareness d/t impulsivity & poor insight. Continue to recommend d/c to SNF to maximize safety & functional independence.      Current Level of Function Impacting Discharge (ADLs): SPV-Min A for ADLs and mobility with RW    Other factors to consider for discharge: fall risk, COVID+, lives with granddaughter who also had COVID, high PLOF         PLAN :  Goals have been updated based on progression since last assessment. Patient continues to benefit from skilled intervention to address the above impairments. Continue to follow patient 3 times a week to address goals.    Recommend with staff: Recommend with nursing, ADLs with assist PRN, OOB to chair 3x/day via walker and toileting via functional mobility to and from bathroom. Thank you for completing as able in order to maintain patient strength, endurance and independence.     Recommendation for discharge: (in order for the patient to meet his/her long term goals)  Therapy up to 5 days/week in SNF setting    This discharge recommendation:  Has been made in collaboration with the attending provider and/or case  management    IF patient discharges home will need the following DME: To be determined (TBD)         SUBJECTIVE:   Patient stated "You got any macaroni and cheese, I haven't eaten in forever."    OBJECTIVE DATA SUMMARY:   Cognitive/Behavioral Status:  Neurologic State:  Alert;Agitated  Orientation Level: Oriented to person;Disoriented to place;Disoriented to situation;Disoriented to time  Cognition: Decreased attention/concentration;Decreased command following;Impaired decision making;Impulsive;Poor safety awareness;Memory loss  Perception: Appears intact  Perseveration: Perseverates during conversation(wanting to eat, hungry)  Safety/Judgement: Decreased awareness of environment;Decreased awareness of need for assistance;Decreased awareness of need for safety;Lack of insight into deficits    Functional Mobility and Transfers for ADLs:  Bed Mobility:  Supine to Sit: Supervision  Sit to Supine: Supervision  Scooting: Supervision    Transfers:  Sit to Stand: Stand-by assistance;Contact guard assistance(icnr A with ambulation)  Functional Transfers  Bathroom Mobility: Contact guard assistance  Toilet Transfer : Contact guard assistance       Balance:  Sitting: Intact  Standing: Impaired;With support(RW, R post-op shoe)  Standing - Static: Good  Standing - Dynamic : Good;Fair    ADL Intervention:  Lower Body Dressing Assistance  Dressing Assistance: Minimum assistance  Pants With Elastic Waist: Contact guard assistance(simulated)  Shoes with Velcro: Minimum assistance(w/ R post-op shoe)  Leg Crossed Method Used: Yes  Position Performed: Seated edge of bed;Standing  Cues: Verbal cues provided;Tactile cues provided;Visual cues provided    Toileting  Toileting Assistance: Contact guard assistance(simulated in bathroom)  Bladder Hygiene: Supervision  Bowel Hygiene: Contact guard assistance  Clothing Management: Contact guard assistance  Cues: Tactile cues provided;Visual cues provided;Verbal cues provided  Adaptive Equipment: Grab bars;Walker    Cognitive Retraining  Safety/Judgement: Decreased awareness of environment;Decreased awareness of need for assistance;Decreased awareness of need for safety;Lack of insight into deficits    Therapeutic Exercises:   Ambulation to door & in/out of  bathroom with up to CGA and RW support, mod cues for safety awareness    Pain:  L foot pain reported    Activity Tolerance:   Fair    After treatment patient left in no apparent distress:   Supine in bed, Call bell within reach, Bed / chair alarm activated, and Side rails x 3    COMMUNICATION/COLLABORATION:   The patient's plan of care was discussed with: Physical therapist and Registered nurse.     Vernell LOISE Ellen, OTD, OTR/L  Time Calculation: 24 mins

## 2019-06-13 LAB — GLUCOSE, POC: Glucose (POC): 96 mg/dL (ref 65–100)

## 2019-06-13 LAB — POCT GLUCOSE: POC Glucose: 96 mg/dL (ref 65–100)

## 2019-06-13 MED FILL — ACETAMINOPHEN 325 MG TABLET: 325 mg | ORAL | Qty: 2

## 2019-06-13 MED FILL — CHOLECALCIFEROL (VITAMIN D3) 1,000 UNIT (25 MCG) TAB: ORAL | Qty: 2

## 2019-06-13 MED FILL — PANTOPRAZOLE 40 MG TAB, DELAYED RELEASE: 40 mg | ORAL | Qty: 1

## 2019-06-13 MED FILL — SUCRALFATE 1 GRAM TAB: 1 gram | ORAL | Qty: 1

## 2019-06-13 MED FILL — GABAPENTIN 300 MG CAP: 300 mg | ORAL | Qty: 1

## 2019-06-13 MED FILL — ZINC-220 50 MG ZINC (220 MG) CAPSULE: 50 mg zinc (220 mg) | ORAL | Qty: 1

## 2019-06-13 MED FILL — DOK 100 MG CAPSULE: 100 mg | ORAL | Qty: 1

## 2019-06-13 NOTE — Progress Notes (Addendum)
Progress Note    Patient: Michael Lozano MRN: [id number]  SSN: [id number]    Date of Birth: 1934-07-01  Age: 85 y.o.  Sex: male      Admit Date: 06/02/2019    LOS: 11 days      Awaiting insurance Authorization for placement at [facility]    06/12/2019: Patient till complaining of not enough food. He has been snacking out of a large figt box of food from his daughter    06/12/2019: Patient is under the blanket, head included, and refused to take his head out or allow a physical exam today. He states there is not enough junk food in the hospital. He needs more chips options. He has no other complaints. Case management is still working on placement      Assessment/plan:   1.  Generalized weakness/deconditioning.  Case management has been consulted for rehab placement.    2.  Recent COVID-19 virus infection.  Patient remains in contact and droplet isolation. Has taste changes and is frustrated with it    3.  Diabetes mellitus.  Currently on sliding scale insulin. Daughter sent a huge basket of food shich he has been snacking out of. Glucose stabl in the normal ranges long enough, we DCd the finger sticks    4.  Acute kidney injury present on admission is now resolved.  Creatinine appears to be close to baseline. DCd renal panels    5.  Diabetic neuropathy.  Continue gabapentin.      Current Facility-Administered Medications:   ???  ammonium lactate (LAC-HYDRIN) 12 % lotion, , Topical, DAILY, Pervis Hocking, MD, Given at 06/13/19 1039  ???  albumin human 25% (BUMINATE) solution 12.5 g, 12.5 g, IntraVENous, Q8H PRN, Posey Pronto, Deep V, MD  ???  gabapentin (NEURONTIN) capsule 300 mg, 300 mg, Oral, BID, Theodore Demark R, NP-C, 300 mg at 06/13/19 1036  ???  pantoprazole (PROTONIX) tablet 40 mg, 40 mg, Oral, BID, Theodore Demark R, NP-C, 40 mg at 06/13/19 1036  ???  cholecalciferol (VITAMIN D3) (1000 Units /25 mcg) tablet 2,000 Units, 2,000 Units, Oral, DAILY, Theodore Demark R, NP-C, 2,000 Units at 06/13/19 1036  ???  sucralfate  (CARAFATE) tablet 1 g, 1 g, Oral, AC&HS, Theodore Demark R, NP-C, 1 g at 06/13/19 1036  ???  docusate sodium (COLACE) capsule 100 mg, 100 mg, Oral, DAILY, Theodore Demark R, NP-C, 100 mg at 06/13/19 1037  ???  HYDROmorphone (PF) (DILAUDID) injection 0.5 mg, 0.5 mg, IntraVENous, Q4H PRN, Arta Silence, NP, 0.5 mg at 06/05/19 0835  ???  dextrose 5% infusion, 50 mL/hr, IntraVENous, CONTINUOUS, Patel, Deep V, MD, Last Rate: 50 mL/hr at 06/12/19 2233, 50 mL/hr at 06/12/19 2233  ???  albuterol (PROVENTIL HFA, VENTOLIN HFA, PROAIR HFA) inhaler 1 Puff, 1 Puff, Inhalation, Q4H PRN, Erline Levine, NP  ???  glucose chewable tablet 16 g, 4 Tab, Oral, PRN, Erline Levine, NP  ???  dextrose (D50W) injection syrg 12.5-25 g, 25-50 mL, IntraVENous, PRN, Erline Levine, NP  ???  glucagon (GLUCAGEN) injection 1 mg, 1 mg, IntraMUSCular, PRN, Erline Levine, NP  ???  acetaminophen (TYLENOL) tablet 650 mg, 650 mg, Oral, Q6H PRN, 650 mg at 06/11/19 2153 **OR** acetaminophen (TYLENOL) suppository 650 mg, 650 mg, Rectal, Q6H PRN, Arta Silence, NP  ???  acetaminophen (TYLENOL) tablet 650 mg, 650 mg, Oral, Q6H, Sinnatamby, Diane S, MD, 650 mg at 06/13/19 0715  ???  sodium chloride (NS) flush 5-40 mL, 5-40 mL, IntraVENous, Q8H, Lockard,  Remi Deter, NP, 10 mL at 06/13/19 1400  ???  sodium chloride (NS) flush 5-40 mL, 5-40 mL, IntraVENous, PRN, Lockard, Remi Deter, NP  ???  polyethylene glycol (MIRALAX) packet 17 g, 17 g, Oral, DAILY PRN, Lockard, Remi Deter, NP  ???  promethazine (PHENERGAN) tablet 12.5 mg, 12.5 mg, Oral, Q6H PRN **OR** ondansetron (ZOFRAN) injection 4 mg, 4 mg, IntraVENous, Q6H PRN, Lockard, Remi Deter, NP, 4 mg at 06/05/19 1320    Interval summary:  Patient was admitted to the intensive care unit on 06/02/2019 after presenting to the emergency department with acute kidney injury, hypernatremia.  Patient had been recently diagnosed with COVID-19 on 05/18/2019, and tested positive again on 06/02/2019.  Per emergency department records, P "He has had a gradual decline,  very poor p.o. intake to the point that he has not been eating or drinking for several days. ??He also is somnolent and less interactive. ??He has not fallen or had recurrent fevers".  Patient was also hypothermic and hypotensive at admission.  There was concern for sepsis, and he was started on IV fluids, and initial antibiotics IV cefepime and IV vancomycin.  Mild coffee-ground emesis was noted via nasogastric tube and gastroenterologist was consulted.  They recommended supportive care with either PPI twice daily or Pepcid twice daily, and felt that EGD was not indicated.  He has been followed by infectious disease specialist.  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  However, cultures on 05/17/2019 from the wound was positive for light E.cloacae complex, Providencia rettgeri sensitive to cefepime.   ID recommended Zyvox instead of vancomycin due to the potential contribution to nephrotoxicity.  IV Flagyl was added for concern for aspiration, and continued until 06/08/2019.  It was documented by ID on 06/06/2019 that  organisms in wound culture were probably colonization. urine cultures came back positive for  Pseudomonas and ecoli. Infectious disease specialist has been following throughout, and commended to continue IV cefepime for 1 week, stopping on the night of 06/09/2019.      06/10/2019: Patient's only complaint is he needs mac and cheese, potato chips and pepsi. He said sugar  Has nothing to do with it. He is happy about the basket his daughter brought him.     Subjective:     Patient has no complaints at this time, except for feeling weak.  He was living at home with family, and in this home, there are stairs which states that he certainly cannot manage at this time.  Patient states that he usually ambulates without any assistive devices.  Patient is interested in short-term inpatient rehab possibly skilled nursing  facility long-term.  Patient states that he sometimes has pain in the right foot, but it is much improved.  Patient denies any pain anywhere else, nausea, vomiting, shortness of breath.  Patient has right plantar ulcer which is clean-based on exam.  Patient has not had any fever per review of vital signs for the last 48 hours.    Objective:     Vitals:    06/09/19 0707 06/09/19 1113 06/09/19 1304 06/09/19 1442   BP: (!) 107/52 125/63 119/69 111/61   Pulse: 60 67 69 61   Resp: _0 Temp: 98 ??F (36.7 ??C) 97.8 ??F (36.6 ??C) 98.2 ??F (36.8 ??C) 98.6 ??F (37 ??C)   SpO2: 98% 97% 98% 96%   Oxygen therapy  room air  room  air  room air  room air        Intake and Output:  Current Shift: No intake/output data recorded.  Last three shifts: No intake/output data recorded.    Physical Exam:   Estimated body mass index is 29.04 kg/m?? as calculated from the following:    Height as of this encounter: _0  (1.778 m).    Weight as of this encounter: 91.8 kg (202 lb 6.4 oz).   General: In no acute distress.  Well developed, well nourished.  Head: Normocephalic, atraumatic.  Eyes: Anicteric sclera.  PERRL.  Extraocular muscles intact.  ENT: External ears and nose appear normal.  Oral mucosa moist.  Neck: Supple.  No jugular venous distention.  Heart: Regular rate and rhythm.  No murmurs appreciated.  Chest: Symmetrical excursion.  Clear to auscultation bilaterally.  Abdomen: Soft, nontender.  No abnormal distention. Bowel sounds are present throughout.  Extremities: No gross deformities.  No particular edema, no cyanosis.  Feet are warm to touch.  Neurological: Being all extremities spontaneously with at least antigravity strength.  Alert, oriented X3.  Skin: No jaundice.  Patient has a clean-based quarter sized ulcer about 1 cm deep on the plantar aspect of his right foot at the location of the second metatarsal.      Lab/Data Review:  Recent Results (from the past 24 hour(s))   LACTIC ACID    Collection Time: 06/09/19 12:45 AM    Result Value Ref Range    Lactic acid 2.0 0.4 - 2.0 MMOL/L   MAGNESIUM    Collection Time: 06/09/19 12:45 AM   Result Value Ref Range    Magnesium 1.9 1.6 - 2.4 mg/dL   PHOSPHORUS    Collection Time: 06/09/19 12:45 AM   Result Value Ref Range    Phosphorus 2.0 (L) 2.6 - 4.7 MG/DL   METABOLIC PANEL, BASIC    Collection Time: 06/09/19 12:45 AM   Result Value Ref Range    Sodium 141 136 - 145 mmol/L    Potassium 4.3 3.5 - 5.1 mmol/L    Chloride 115 (H) 97 - 108 mmol/L    CO2 18 (L) 21 - 32 mmol/L    Anion gap 8 5 - 15 mmol/L    Glucose 155 (H) 65 - 100 mg/dL    BUN 18 6 - 20 MG/DL    Creatinine 1.33 (H) 0.70 - 1.30 MG/DL    BUN/Creatinine ratio 14 12 - 20      GFR est AA >60 >60 ml/min/1.54m    GFR est non-AA 51 (L) >60 ml/min/1.725m   Calcium 8.3 (L) 8.5 - 10.1 MG/DL   CBC W/O DIFF    Collection Time: 06/09/19 12:45 AM   Result Value Ref Range    WBC 7.4 4.1 - 11.1 K/uL    RBC 3.28 (L) 4.10 - 5.70 M/uL    HGB 9.2 (L) 12.1 - 17.0 g/dL    HCT 29.1 (L) 36.6 - 50.3 %    MCV 88.7 80.0 - 99.0 FL    MCH 28.0 26.0 - 34.0 PG    MCHC 31.6 30.0 - 36.5 g/dL    RDW 17.3 (H) 11.5 - 14.5 %    PLATELET 145 (L) 150 - 400 K/uL    MPV 12.5 8.9 - 12.9 FL    NRBC 0.0 0 PER 100 WBC    ABSOLUTE NRBC 0.00 0.00 - 0.01 K/uL   GLUCOSE, POC    Collection Time: 06/09/19  8:11 AM   Result  Value Ref Range    Glucose (POC) 126 (H) 65 - 100 mg/dL    Performed by [facility].    GLUCOSE, POC    Collection Time: 06/09/19 11:14 AM   Result Value Ref Range    Glucose (POC) 172 (H) 65 - 100 mg/dL    Performed by [employer] [facility].    GLUCOSE, POC    Collection Time: 06/09/19  4:15 PM   Result Value Ref Range    Glucose (POC) 141 (H) 65 - 100 mg/dL    Performed by [facility]  [place]          Signed By: Sue Lush, MD     June 13, 2019

## 2019-06-13 NOTE — Progress Notes (Addendum)
Transition of Care: TBD; needs a SNF;??[facility] has accepted; insurance policy is questionable at this time; apparently patients [employer] policy [id number] expired on 3/31 and his [employer] coverage is not valid when SNF tries to verify although it was e verified by hospital; CM team lead is aware and working on this  ??  Transport Plan: likely BLS??(not set up yet)  ??  RUR: 25%  ??  Main contact- daughter- Linda- [phone]  ??  1435: patients insurance information still questionable at this time; patients family unable to provide further information at this time; this CM called patients daughter Linda to f/u; she states that they are working on why the [employer] policy was not auto-renewed and has expired; she had no updates    1500: this CM faxed Stacy at [facility] insurance attachments to review again    1638: updated attending by [employer] text  ??  ??  CM following  Cary Butcher, RN, CRM

## 2019-06-13 NOTE — Progress Notes (Signed)
Physical Therapy  Attempted to work with patient.  He is refusing to participate today.states he will move when he wants and not now. States will go to "a home" and doesn't need to get up.  Will follow up again tomorrow.  Shalane Florendo S Delmore Sear, PT

## 2019-06-13 NOTE — Progress Notes (Signed)
Physical Therapy  Attempted to work with patient.  He is refusing to participate today.states he will move when he wants and not now. States will go to "a home" and doesn't need to get up.  Will follow up again tomorrow.  Gerre Pebbles, PT

## 2019-06-13 NOTE — Progress Notes (Signed)
Progress Notes by Sue Lush, MD at 06/13/19 1507                Author: Sue Lush, MD  Service: Hospitalist  Author Type: Physician       Filed: 06/13/19 1510  Date of Service: 06/13/19 1507  Status: Addendum          Editor: Sue Lush, MD (Physician)          Related Notes: Original Note by Sue Lush, MD (Physician) filed at 06/13/19 1509                               Progress Note          Patient: Michael Lozano  MRN: [id number]    SSN: [id number]           Date of Birth: 12/16/1934    Age: 85 y.o.    Sex: male          Admit Date: 06/02/2019      LOS: 11 days        Awaiting insurance Authorization for placement at [facility]      06/12/2019: Patient till complaining of not enough food. He has been snacking out of a large figt box of food from his daughter      06/12/2019: Patient is under the blanket, head included, and refused to take his head out or allow a physical exam today. He states there is not enough junk food in the hospital. He needs more chips options. He has no other complaints. Case management  is still working on placement           Assessment/plan :     1.  Generalized weakness/deconditioning.  Case management has been consulted for rehab placement.      2.  Recent COVID-19 virus infection.  Patient remains in contact and droplet isolation. Has taste changes and is frustrated with it      3.  Diabetes mellitus.  Currently on sliding scale insulin. Daughter sent a huge basket of food shich he has been snacking out of. Glucose stabl in the normal ranges long enough, we DCd the finger sticks      4.  Acute kidney injury present on admission is now resolved.  Creatinine appears to be close to baseline. DCd renal panels      5.  Diabetic neuropathy.  Continue gabapentin.         Current Facility-Administered Medications:    ?  ammonium lactate (LAC-HYDRIN) 12 % lotion, , Topical, DAILY, Pervis Hocking, MD, Given at 06/13/19 1039   ?  albumin human 25% (BUMINATE)  solution 12.5 g, 12.5 g, IntraVENous, Q8H PRN, Patel, Deep V, MD   ?  gabapentin (NEURONTIN) capsule 300 mg, 300 mg, Oral, BID, Theodore Demark R, NP-C, 300 mg at 06/13/19 1036   ?  pantoprazole (PROTONIX) tablet 40 mg, 40 mg, Oral, BID, Theodore Demark R, NP-C, 40 mg at 06/13/19 1036   ?  cholecalciferol (VITAMIN D3) (1000 Units /25 mcg) tablet 2,000 Units, 2,000 Units, Oral, DAILY, Theodore Demark R, NP-C, 2,000 Units at 06/13/19 1036   ?  sucralfate (CARAFATE) tablet 1 g, 1 g, Oral, AC&HS, Theodore Demark R, NP-C, 1 g at 06/13/19 1036   ?  docusate sodium (COLACE) capsule 100 mg, 100 mg, Oral, DAILY, Theodore Demark R, NP-C, 100 mg at 06/13/19 1037   ?  HYDROmorphone (PF) (  DILAUDID) injection 0.5 mg, 0.5 mg, IntraVENous, Q4H PRN, Arta Silence, NP, 0.5 mg at 06/05/19 0835   ?  dextrose 5% infusion, 50 mL/hr, IntraVENous, CONTINUOUS, Patel, Deep V, MD, Last Rate: 50 mL/hr at 06/12/19 2233, 50 mL/hr at 06/12/19 2233   ?  albuterol (PROVENTIL HFA, VENTOLIN HFA, PROAIR HFA) inhaler 1 Puff, 1 Puff, Inhalation, Q4H PRN, Erline Levine, NP   ?  glucose chewable tablet 16 g, 4 Tab, Oral, PRN, Erline Levine, NP   ?  dextrose (D50W) injection syrg 12.5-25 g, 25-50 mL, IntraVENous, PRN, Erline Levine, NP   ?  glucagon (GLUCAGEN) injection 1 mg, 1 mg, IntraMUSCular, PRN, Erline Levine, NP   ?  acetaminophen (TYLENOL) tablet 650 mg, 650 mg, Oral, Q6H PRN, 650 mg at 06/11/19 2153 **OR** acetaminophen (TYLENOL) suppository 650 mg, 650 mg, Rectal, Q6H PRN, Arta Silence, NP   ?  acetaminophen (TYLENOL) tablet 650 mg, 650 mg, Oral, Q6H, Sinnatamby, Marylu Lund, MD, 650 mg at 06/13/19 0715   ?  sodium chloride (NS) flush 5-40 mL, 5-40 mL, IntraVENous, Q8H, Lockard, Sara H, NP, 10 mL at 06/13/19 1400   ?  sodium chloride (NS) flush 5-40 mL, 5-40 mL, IntraVENous, PRN, Lockard, Remi Deter, NP   ?  polyethylene glycol (MIRALAX) packet 17 g, 17 g, Oral, DAILY PRN, Lockard, Remi Deter, NP   ?  promethazine (PHENERGAN) tablet 12.5 mg, 12.5 mg,  Oral, Q6H PRN **OR** ondansetron (ZOFRAN) injection 4 mg, 4 mg, IntraVENous, Q6H PRN, Lockard, Remi Deter, NP, 4 mg at 06/05/19 1320      Interval summary:   Patient was admitted to the intensive care unit on 06/02/2019 after presenting to the emergency department with acute kidney injury, hypernatremia.  Patient had been recently diagnosed with COVID-19  on 05/18/2019, and tested positive again on 06/02/2019.  Per emergency department records, P "He has had a gradual decline, very poor p.o. intake to the point that he has not been eating or drinking for several days. ??He also is somnolent and less interactive.  ??He has not fallen or had recurrent fevers".  Patient was also hypothermic and hypotensive at admission.  There was concern for sepsis,  and he was started on IV fluids, and initial antibiotics IV cefepime and IV vancomycin.  Mild coffee-ground emesis was noted via nasogastric tube and gastroenterologist was consulted.  They recommended supportive care with either PPI twice daily or Pepcid  twice daily, and felt that EGD was not indicated.  He has been followed by infectious disease specialist.  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  However,  cultures on 05/17/2019 from the wound was positive for light E.cloacae complex, Providencia rettgeri sensitive to cefepime.    ID recommended Zyvox instead of vancomycin due to the potential contribution to nephrotoxicity.  IV Flagyl was added for concern for aspiration, and continued until 06/08/2019.  It was documented by  ID on 06/06/2019 that  organisms in wound culture were probably colonization. urine cultures came back positive for  Pseudomonas and ecoli. Infectious disease specialist has been following throughout, and commended to continue IV cefepime for 1 week, stopping  on the night of 06/09/2019.         06/10/2019: Patient's only complaint is he needs mac and cheese, potato  chips and pepsi. He said sugar  Has nothing to do with it. He is happy about the  basket his daughter brought him.         Subjective:        Patient has no complaints at this time, except for feeling weak.  He was living at home with family, and in this home, there are stairs which states that he certainly cannot manage at this time.  Patient  states that he usually ambulates without any assistive devices.  Patient is interested in short-term inpatient rehab possibly skilled nursing facility long-term.  Patient states that he sometimes has pain in the right foot, but it is much improved.  Patient  denies any pain anywhere else, nausea, vomiting, shortness of breath.  Patient has right plantar ulcer which is clean-based on exam.  Patient has not had any fever per review of vital signs for the last 48 hours.        Objective:          Vitals:             06/09/19 0707  06/09/19 1113  06/09/19 1304  06/09/19 1442           BP:  (!) 107/52  125/63  119/69  111/61     Pulse:  60  67  69  61     Resp:  _0 Temp:  98 ??F (36.7 ??C)  97.8 ??F (36.6 ??C)  98.2 ??F (36.8 ??C)  98.6 ??F (37 ??C)     SpO2:  98%  97%  98%  96%     Oxygen therapy   room air   room air   room air   room air            Intake and Output:   Current Shift: No intake/output data recorded.   Last three shifts: No intake/output data recorded.      Physical Exam:    Estimated body mass index is 29.04 kg/m?? as calculated from the following:     Height as of this encounter: _1  (1.778 m).     Weight as of this encounter: 91.8 kg (202 lb 6.4 oz).    General: In no acute distress.  Well developed, well nourished.  Head: Normocephalic, atraumatic.  Eyes: Anicteric sclera.  PERRL.  Extraocular muscles intact.  ENT: External ears and nose appear normal.  Oral mucosa moist.  Neck: Supple.   No jugular venous distention.   Heart: Regular rate and rhythm.  No murmurs appreciated.  Chest: Symmetrical excursion.  Clear to auscultation  bilaterally.  Abdomen: Soft, nontender.  No abnormal distention. Bowel sounds are present throughout.  Extremities: No gross deformities.   No particular edema, no cyanosis.  Feet are warm to touch.  Neurological: Being all extremities spontaneously with at least antigravity strength.  Alert, oriented X3.  Skin: No jaundice.  Patient has a clean-based quarter sized ulcer about 1  cm deep on the plantar aspect of his right foot at the location of the second metatarsal.         Lab/Data Review:     Recent Results (from the past 24 hour(s))     LACTIC ACID          Collection Time: 06/09/19 12:45 AM         Result  Value  Ref Range            Lactic acid  2.0  0.4 - 2.0 MMOL/L       MAGNESIUM  Collection Time: 06/09/19 12:45 AM         Result  Value  Ref Range            Magnesium  1.9  1.6 - 2.4 mg/dL       PHOSPHORUS          Collection Time: 06/09/19 12:45 AM         Result  Value  Ref Range            Phosphorus  2.0 (L)  2.6 - 4.7 MG/DL       METABOLIC PANEL, BASIC          Collection Time: 06/09/19 12:45 AM         Result  Value  Ref Range            Sodium  141  136 - 145 mmol/L       Potassium  4.3  3.5 - 5.1 mmol/L       Chloride  115 (H)  97 - 108 mmol/L       CO2  18 (L)  21 - 32 mmol/L       Anion gap  8  5 - 15 mmol/L       Glucose  155 (H)  65 - 100 mg/dL       BUN  18  6 - 20 MG/DL       Creatinine  1.33 (H)  0.70 - 1.30 MG/DL       BUN/Creatinine ratio  14  12 - 20         GFR est AA  >60  >60 ml/min/1.26m       GFR est non-AA  51 (L)  >60 ml/min/1.766m      Calcium  8.3 (L)  8.5 - 10.1 MG/DL       CBC W/O DIFF          Collection Time: 06/09/19 12:45 AM         Result  Value  Ref Range            WBC  7.4  4.1 - 11.1 K/uL       RBC  3.28 (L)  4.10 - 5.70 M/uL       HGB  9.2 (L)  12.1 - 17.0 g/dL       HCT  29.1 (L)  36.6 - 50.3 %       MCV  88.7  80.0 - 99.0 FL       MCH  28.0  26.0 - 34.0 PG       MCHC  31.6  30.0 - 36.5 g/dL       RDW  17.3 (H)  11.5 - 14.5 %       PLATELET  145 (L)  150 -  400 K/uL       MPV  12.5  8.9 - 12.9 FL       NRBC  0.0  0 PER 100 WBC       ABSOLUTE NRBC  0.00  0.00 - 0.01 K/uL       GLUCOSE, POC          Collection Time: 06/09/19  8:11 AM         Result  Value  Ref Range            Glucose (POC)  126 (H)  65 - 100 mg/dL       Performed by  [facility]  GLUCOSE, POC          Collection Time: 06/09/19 11:14 AM         Result  Value  Ref Range            Glucose (POC)  172 (H)  65 - 100 mg/dL       Performed by  [employer].         GLUCOSE, POC          Collection Time: 06/09/19  4:15 PM         Result  Value  Ref Range            Glucose (POC)  141 (H)  65 - 100 mg/dL            Performed by  [facility]  [place]                   Signed By:  Sue Lush, MD           June 13, 2019

## 2019-06-13 NOTE — Progress Notes (Signed)
 Transition of Care: TBD; needs a SNF;[facility] has accepted; insurance policy is questionable at this time; apparently patients [employer] policy [id number] expired on 3/31 and his [employer] coverage is not valid when SNF tries to verify although it was e verified by hospital; CM team lead is aware and working on this    Transport Plan: likely BLS(not set up yet)    RUR: 25%    Main contact- daughterGLENWOOD Handing- [phone]    1435: patients insurance information still questionable at this time; patients family unable to provide further information at this time; this CM called patients daughter Handing to f/u; she states that they are working on why the [employer] was not auto-renewed and has expired; she had no updates    1500: this CM faxed Stacy at [employer] attachments to review again    1638: updated attending by [employer] text      CM following  Rodgers Moors, RN, CRM

## 2019-06-14 MED FILL — PANTOPRAZOLE 40 MG TAB, DELAYED RELEASE: 40 mg | ORAL | Qty: 1

## 2019-06-14 MED FILL — SUCRALFATE 1 GRAM TAB: 1 gram | ORAL | Qty: 1

## 2019-06-14 MED FILL — GABAPENTIN 300 MG CAP: 300 mg | ORAL | Qty: 1

## 2019-06-14 MED FILL — ACETAMINOPHEN 325 MG TABLET: 325 mg | ORAL | Qty: 2

## 2019-06-14 MED FILL — DOK 100 MG CAPSULE: 100 mg | ORAL | Qty: 1

## 2019-06-14 MED FILL — CHOLECALCIFEROL (VITAMIN D3) 1,000 UNIT (25 MCG) TAB: ORAL | Qty: 2

## 2019-06-14 NOTE — Progress Notes (Signed)
Progress Note    Patient: Michael Lozano MRN: [id number]  SSN: [id number]    Date of Birth: 08/27/1934  Age: 85 y.o.  Sex: male      Admit Date: 06/02/2019    LOS: 12 days        Patient is a pleasant 35 YOCM w/Hx DM, Diabetic neuropathy, and debility who was admitted for [place] on 02 June 2019, who has recovered, and is awaiting placement for rehab in a SNF. He is not currently getting any labs, but he is working with PT/OT and being monitored.     Awaiting insurance Authorization for placement at [facility]    06/12/2019: Patient till complaining of not enough food. He has been snacking out of a large figt box of food from his daughter    06/13/2019: Patient is under the blanket, head included, and refused to take his head out or allow a physical exam today. He states there is not enough junk food in the hospital. He needs more chips options. He has no other complaints. Case management is still working on placement    06/13/2019: Patient worked with PT today. He is still complaining that we need better food in the hospital. He is pleasant today with no further complaints.     Assessment/plan:   1.  Generalized weakness/deconditioning.  Case management has been consulted for rehab placement.    2.  Recent COVID-19 virus infection.  Patient remains in contact and droplet isolation. Has taste changes and is frustrated with it    3.  Diabetes mellitus.  Currently on sliding scale insulin. Daughter sent a huge basket of food which he is still snacking out of. Glucose stabl in the normal ranges long enough, we DCd the finger sticks    4.  Acute kidney injury present on admission is now resolved.  Creatinine appears to be close to baseline. DCd renal panels    5.  Diabetic neuropathy.  Continue gabapentin.      Current Facility-Administered Medications:   ???  ammonium lactate (LAC-HYDRIN) 12 % lotion, , Topical, DAILY, Pervis Hocking, MD, Given at 06/14/19 [phone]  ???  albumin human 25% (BUMINATE) solution 12.5 g, 12.5 g,  IntraVENous, Q8H PRN, Posey Pronto, Deep V, MD  ???  gabapentin (NEURONTIN) capsule 300 mg, 300 mg, Oral, BID, Theodore Demark R, NP-C, 300 mg at 06/14/19 3810  ???  pantoprazole (PROTONIX) tablet 40 mg, 40 mg, Oral, BID, Theodore Demark R, NP-C, 40 mg at 06/14/19 1751  ???  cholecalciferol (VITAMIN D3) (1000 Units /25 mcg) tablet 2,000 Units, 2,000 Units, Oral, DAILY, Theodore Demark R, NP-C, 2,000 Units at 06/14/19 0258  ???  sucralfate (CARAFATE) tablet 1 g, 1 g, Oral, AC&HS, Theodore Demark R, NP-C, 1 g at 06/14/19 1243  ???  docusate sodium (COLACE) capsule 100 mg, 100 mg, Oral, DAILY, Theodore Demark R, NP-C, 100 mg at 06/14/19 5277  ???  HYDROmorphone (PF) (DILAUDID) injection 0.5 mg, 0.5 mg, IntraVENous, Q4H PRN, Arta Silence, NP, 0.5 mg at 06/05/19 0835  ???  albuterol (PROVENTIL HFA, VENTOLIN HFA, PROAIR HFA) inhaler 1 Puff, 1 Puff, Inhalation, Q4H PRN, Erline Levine, NP  ???  glucose chewable tablet 16 g, 4 Tab, Oral, PRN, Erline Levine, NP  ???  dextrose (D50W) injection syrg 12.5-25 g, 25-50 mL, IntraVENous, PRN, Erline Levine, NP  ???  glucagon (GLUCAGEN) injection 1 mg, 1 mg, IntraMUSCular, PRN, Erline Levine, NP  ???  acetaminophen (TYLENOL) tablet 650 mg, 650 mg, Oral,  Q6H PRN, 650 mg at 06/11/19 2153 **OR** acetaminophen (TYLENOL) suppository 650 mg, 650 mg, Rectal, Q6H PRN, Arta Silence, NP  ???  acetaminophen (TYLENOL) tablet 650 mg, 650 mg, Oral, Q6H, Sinnatamby, Diane S, MD, 650 mg at 06/14/19 1243  ???  sodium chloride (NS) flush 5-40 mL, 5-40 mL, IntraVENous, Q8H, Lockard, Sara H, NP, 10 mL at 06/14/19 0617  ???  sodium chloride (NS) flush 5-40 mL, 5-40 mL, IntraVENous, PRN, Lockard, Remi Deter, NP  ???  polyethylene glycol (MIRALAX) packet 17 g, 17 g, Oral, DAILY PRN, Lockard, Remi Deter, NP  ???  promethazine (PHENERGAN) tablet 12.5 mg, 12.5 mg, Oral, Q6H PRN **OR** ondansetron (ZOFRAN) injection 4 mg, 4 mg, IntraVENous, Q6H PRN, Lockard, Remi Deter, NP, 4 mg at 06/05/19 1320    Interval summary:  Patient was admitted to the intensive  care unit on 06/02/2019 after presenting to the emergency department with acute kidney injury, hypernatremia.  Patient had been recently diagnosed with COVID-19 on 05/18/2019, and tested positive again on 06/02/2019.  Per emergency department records, P "He has had a gradual decline, very poor p.o. intake to the point that he has not been eating or drinking for several days. ??He also is somnolent and less interactive. ??He has not fallen or had recurrent fevers".  Patient was also hypothermic and hypotensive at admission.  There was concern for sepsis, and he was started on IV fluids, and initial antibiotics IV cefepime and IV vancomycin.  Mild coffee-ground emesis was noted via nasogastric tube and gastroenterologist was consulted.  They recommended supportive care with either PPI twice daily or Pepcid twice daily, and felt that EGD was not indicated.  He has been followed by infectious disease specialist.  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  However, cultures on 05/17/2019 from the wound was positive for light E.cloacae complex, Providencia rettgeri sensitive to cefepime.   ID recommended Zyvox instead of vancomycin due to the potential contribution to nephrotoxicity.  IV Flagyl was added for concern for aspiration, and continued until 06/08/2019.  It was documented by ID on 06/06/2019 that  organisms in wound culture were probably colonization. urine cultures came back positive for  Pseudomonas and ecoli. Infectious disease specialist has been following throughout, and commended to continue IV cefepime for 1 week, stopping on the night of 06/09/2019.      06/10/2019: Patient's only complaint is he needs mac and cheese, potato chips and pepsi. He said sugar  Has nothing to do with it. He is happy about the basket his daughter brought him.     Subjective:     Patient has no complaints at this time, except for feeling weak.  He  was living at home with family, and in this home, there are stairs which states that he certainly cannot manage at this time.  Patient states that he usually ambulates without any assistive devices.  Patient is interested in short-term inpatient rehab possibly skilled nursing facility long-term.  Patient states that he sometimes has pain in the right foot, but it is much improved.  Patient denies any pain anywhere else, nausea, vomiting, shortness of breath.  Patient has right plantar ulcer which is clean-based on exam.  Patient has not had any fever per review of vital signs for the last 48 hours.    Objective:     Vitals:    06/09/19 0707 06/09/19 1113 06/09/19 1304 06/09/19 1442  BP: (!) 107/52 125/63 119/69 111/61   Pulse: 60 67 69 61   Resp: _0 Temp: 98 ??F (36.7 ??C) 97.8 ??F (36.6 ??C) 98.2 ??F (36.8 ??C) 98.6 ??F (37 ??C)   SpO2: 98% 97% 98% 96%   Oxygen therapy  room air  room air  room air  room air        Intake and Output:  Current Shift: No intake/output data recorded.  Last three shifts: No intake/output data recorded.    Physical Exam:   Estimated body mass index is 29.04 kg/m?? as calculated from the following:    Height as of this encounter: _1  (1.778 m).    Weight as of this encounter: 91.8 kg (202 lb 6.4 oz).   General: In no acute distress.  Well developed, well nourished.  Head: Normocephalic, atraumatic.  Eyes: Anicteric sclera.  PERRL.  Extraocular muscles intact.  ENT: External ears and nose appear normal.  Oral mucosa moist.  Neck: Supple.  No jugular venous distention.  Heart: Regular rate and rhythm.  No murmurs appreciated.  Chest: Symmetrical excursion.  Clear to auscultation bilaterally.  Abdomen: Soft, nontender.  No abnormal distention. Bowel sounds are present throughout.  Extremities: No gross deformities.  No particular edema, no cyanosis.  Feet are warm to touch.  Neurological: Being all extremities spontaneously with at least antigravity strength.  Alert, oriented X3.   Skin: No jaundice.  Patient has a clean-based quarter sized ulcer about 1 cm deep on the plantar aspect of his right foot at the location of the second metatarsal.      Lab/Data Review:  Recent Results (from the past 24 hour(s))   LACTIC ACID    Collection Time: 06/09/19 12:45 AM   Result Value Ref Range    Lactic acid 2.0 0.4 - 2.0 MMOL/L   MAGNESIUM    Collection Time: 06/09/19 12:45 AM   Result Value Ref Range    Magnesium 1.9 1.6 - 2.4 mg/dL   PHOSPHORUS    Collection Time: 06/09/19 12:45 AM   Result Value Ref Range    Phosphorus 2.0 (L) 2.6 - 4.7 MG/DL   METABOLIC PANEL, BASIC    Collection Time: 06/09/19 12:45 AM   Result Value Ref Range    Sodium 141 136 - 145 mmol/L    Potassium 4.3 3.5 - 5.1 mmol/L    Chloride 115 (H) 97 - 108 mmol/L    CO2 18 (L) 21 - 32 mmol/L    Anion gap 8 5 - 15 mmol/L    Glucose 155 (H) 65 - 100 mg/dL    BUN 18 6 - 20 MG/DL    Creatinine 1.33 (H) 0.70 - 1.30 MG/DL    BUN/Creatinine ratio 14 12 - 20      GFR est AA >60 >60 ml/min/1.65m    GFR est non-AA 51 (L) >60 ml/min/1.766m   Calcium 8.3 (L) 8.5 - 10.1 MG/DL   CBC W/O DIFF    Collection Time: 06/09/19 12:45 AM   Result Value Ref Range    WBC 7.4 4.1 - 11.1 K/uL    RBC 3.28 (L) 4.10 - 5.70 M/uL    HGB 9.2 (L) 12.1 - 17.0 g/dL    HCT 29.1 (L) 36.6 - 50.3 %    MCV 88.7 80.0 - 99.0 FL    MCH 28.0 26.0 - 34.0 PG    MCHC 31.6 30.0 - 36.5 g/dL    RDW 17.3 (H) 11.5 - 14.5 %  PLATELET 145 (L) 150 - 400 K/uL    MPV 12.5 8.9 - 12.9 FL    NRBC 0.0 0 PER 100 WBC    ABSOLUTE NRBC 0.00 0.00 - 0.01 K/uL   GLUCOSE, POC    Collection Time: 06/09/19  8:11 AM   Result Value Ref Range    Glucose (POC) 126 (H) 65 - 100 mg/dL    Performed by [facility].    GLUCOSE, POC    Collection Time: 06/09/19 11:14 AM   Result Value Ref Range    Glucose (POC) 172 (H) 65 - 100 mg/dL    Performed by [facility].    GLUCOSE, POC    Collection Time: 06/09/19  4:15 PM   Result Value Ref Range    Glucose (POC) 141 (H) 65 - 100 mg/dL    Performed by [place]   [place]          Signed By: Sue Lush, MD     June 14, 2019

## 2019-06-14 NOTE — Progress Notes (Addendum)
Transition of Care: TBD;??needs??a SNF;??[facility] have both accepted;??insurance policy is questionable at this time;??apparently??patients [employer] policy [id number] expired on 3/31 and his Blue Cross/Mediblue coverage is not valid when [facility] tries to verify although it was e verified by hospital;??[facility] is also running the Mediblue insurance policy at this time  ??  Transport Plan: likely BLS??(not set up yet)  ??  RUR: 25%  ??  Main contact- daughter- Linda- [phone]    Barriers to discharge:  -insurance validity  ??  1050: patients insurance information still questionable at this time at [facility]; the other SNF accepting patient is [facility]; called contact at [facility]; they will run the mediblue insurance today; will fax the insurance attachments to [facility]  ??    ??  ??  CM following  Cary Butcher, RN, CRM

## 2019-06-14 NOTE — Progress Notes (Signed)
Physical Therapy  Have attempted to work with patient several times and patient is refusing.  Patient states he doesn't need help to walk and "I have been walking on this foot for 2 years.  I don't need no help."  Explained to patient that he was supposed not put full weight on his foot but he resists this.  At this time patient not appropriate for PT as he refuses and feels no need.  Please re-consult if indicated.  Jasmaine Rochel S Jaylenn Baiza, PT

## 2019-06-14 NOTE — Progress Notes (Signed)
Progress Notes by Sue Lush, MD at 06/14/19 1340                Author: Sue Lush, MD  Service: Hospitalist  Author Type: Physician       Filed: 06/14/19 1343  Date of Service: 06/14/19 1340  Status: Signed          Editor: Sue Lush, MD (Physician)                               Progress Note          Patient: Michael Lozano  MRN: [id number]    SSN: [id number]           Date of Birth: 1934/11/23    Age: 85 y.o.    Sex: male          Admit Date: 06/02/2019      LOS: 12 days           Patient is a pleasant 4 YOCM w/Hx DM, Diabetic neuropathy, and debility who was admitted for [place] on 02 June 2019, who has recovered, and is awaiting placement for rehab in a SNF. He is not currently getting any labs, but he is working with PT/OT and  being monitored.       Awaiting insurance Authorization for placement at [facility]      06/12/2019: Patient till complaining of not enough food. He has been snacking out of a large figt box of food from his daughter      06/13/2019: Patient is under the blanket, head included, and refused to take his head out or allow a physical exam today. He states there is not enough junk food in the hospital. He needs more chips options. He has no other complaints. Case management  is still working on placement      06/13/2019: Patient worked with PT today. He is still complaining that we need better food in the hospital. He is pleasant today with no further complaints.         Assessment/plan :     1.  Generalized weakness/deconditioning.  Case management has been consulted for rehab placement.      2.  Recent COVID-19 virus infection.  Patient remains in contact and droplet isolation. Has taste changes and is frustrated with it      3.  Diabetes mellitus.  Currently on sliding scale insulin. Daughter sent a huge basket of food which he is still snacking out of. Glucose stabl in the normal ranges long enough, we DCd the finger sticks      4.  Acute kidney injury present on  admission is now resolved.  Creatinine appears to be close to baseline. DCd renal panels      5.  Diabetic neuropathy.  Continue gabapentin.         Current Facility-Administered Medications:    ?  ammonium lactate (LAC-HYDRIN) 12 % lotion, , Topical, DAILY, Pervis Hocking, MD, Given at 06/14/19 [phone]   ?  albumin human 25% (BUMINATE) solution 12.5 g, 12.5 g, IntraVENous, Q8H PRN, Patel, Deep V, MD   ?  gabapentin (NEURONTIN) capsule 300 mg, 300 mg, Oral, BID, Theodore Demark R, NP-C, 300 mg at 06/14/19 0347   ?  pantoprazole (PROTONIX) tablet 40 mg, 40 mg, Oral, BID, Theodore Demark R, NP-C, 40 mg at 06/14/19 4259   ?  cholecalciferol (VITAMIN D3) (1000 Units /25 mcg) tablet 2,000  Units, 2,000 Units, Oral, DAILY, Meyer Russel, NP-C, 2,000 Units at 06/14/19 3557   ?  sucralfate (CARAFATE) tablet 1 g, 1 g, Oral, AC&HS, Theodore Demark R, NP-C, 1 g at 06/14/19 1243   ?  docusate sodium (COLACE) capsule 100 mg, 100 mg, Oral, DAILY, Theodore Demark R, NP-C, 100 mg at 06/14/19 3220   ?  HYDROmorphone (PF) (DILAUDID) injection 0.5 mg, 0.5 mg, IntraVENous, Q4H PRN, Arta Silence, NP, 0.5 mg at 06/05/19 0835   ?  albuterol (PROVENTIL HFA, VENTOLIN HFA, PROAIR HFA) inhaler 1 Puff, 1 Puff, Inhalation, Q4H PRN, Erline Levine, NP   ?  glucose chewable tablet 16 g, 4 Tab, Oral, PRN, Erline Levine, NP   ?  dextrose (D50W) injection syrg 12.5-25 g, 25-50 mL, IntraVENous, PRN, Erline Levine, NP   ?  glucagon (GLUCAGEN) injection 1 mg, 1 mg, IntraMUSCular, PRN, Erline Levine, NP   ?  acetaminophen (TYLENOL) tablet 650 mg, 650 mg, Oral, Q6H PRN, 650 mg at 06/11/19 2153 **OR** acetaminophen (TYLENOL) suppository 650 mg, 650 mg, Rectal, Q6H PRN, Arta Silence, NP   ?  acetaminophen (TYLENOL) tablet 650 mg, 650 mg, Oral, Q6H, Sinnatamby, Diane S, MD, 650 mg at 06/14/19 1243   ?  sodium chloride (NS) flush 5-40 mL, 5-40 mL, IntraVENous, Q8H, Lockard, Sara H, NP, 10 mL at 06/14/19 2542   ?  sodium chloride (NS) flush 5-40 mL, 5-40  mL, IntraVENous, PRN, Lockard, Remi Deter, NP   ?  polyethylene glycol (MIRALAX) packet 17 g, 17 g, Oral, DAILY PRN, Lockard, Remi Deter, NP   ?  promethazine (PHENERGAN) tablet 12.5 mg, 12.5 mg, Oral, Q6H PRN **OR** ondansetron (ZOFRAN) injection 4 mg, 4 mg, IntraVENous, Q6H PRN, Lockard, Remi Deter, NP, 4 mg at 06/05/19 1320      Interval summary:   Patient was admitted to the intensive care unit on 06/02/2019 after presenting to the emergency department with acute kidney injury, hypernatremia.  Patient had been recently diagnosed with COVID-19  on 05/18/2019, and tested positive again on 06/02/2019.  Per emergency department records, P "He has had a gradual decline, very poor p.o. intake to the point that he has not been eating or drinking for several days. ??He also is somnolent and less interactive.  ??He has not fallen or had recurrent fevers".  Patient was also hypothermic and hypotensive at admission.  There was concern for sepsis,  and he was started on IV fluids, and initial antibiotics IV cefepime and IV vancomycin.  Mild coffee-ground emesis was noted via nasogastric tube and gastroenterologist was consulted.  They recommended supportive care with either PPI twice daily or Pepcid  twice daily, and felt that EGD was not indicated.  He has been followed by infectious disease specialist.  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  However,  cultures on 05/17/2019 from the wound was positive for light E.cloacae complex, Providencia rettgeri sensitive to cefepime.    ID recommended Zyvox instead of vancomycin due to the potential contribution to nephrotoxicity.  IV Flagyl was added for concern for aspiration, and continued until 06/08/2019.  It was documented by  ID on 06/06/2019 that  organisms in wound culture were probably colonization. urine cultures came back positive for  Pseudomonas and ecoli. Infectious disease specialist has been  following throughout, and commended to continue IV cefepime for 1 week, stopping  on the night of  06/09/2019.         06/10/2019: Patient's only complaint is he needs mac and cheese, potato chips and pepsi. He said sugar  Has nothing to do with it. He is happy about the basket his daughter brought him.         Subjective:        Patient has no complaints at this time, except for feeling weak.  He was living at home with family, and in this home, there are stairs which states that he certainly cannot manage at this time.  Patient  states that he usually ambulates without any assistive devices.  Patient is interested in short-term inpatient rehab possibly skilled nursing facility long-term.  Patient states that he sometimes has pain in the right foot, but it is much improved.  Patient  denies any pain anywhere else, nausea, vomiting, shortness of breath.  Patient has right plantar ulcer which is clean-based on exam.  Patient has not had any fever per review of vital signs for the last 48 hours.        Objective:          Vitals:             06/09/19 0707  06/09/19 1113  06/09/19 1304  06/09/19 1442           BP:  (!) 107/52  125/63  119/69  111/61     Pulse:  60  67  69  61     Resp:  _0 Temp:  98 ??F (36.7 ??C)  97.8 ??F (36.6 ??C)  98.2 ??F (36.8 ??C)  98.6 ??F (37 ??C)     SpO2:  98%  97%  98%  96%     Oxygen therapy   room air   room air   room air   room air            Intake and Output:   Current Shift: No intake/output data recorded.   Last three shifts: No intake/output data recorded.      Physical Exam:    Estimated body mass index is 29.04 kg/m?? as calculated from the following:     Height as of this encounter: _1  (1.778 m).     Weight as of this encounter: 91.8 kg (202 lb 6.4 oz).    General: In no acute distress.  Well developed, well nourished.  Head: Normocephalic, atraumatic.  Eyes: Anicteric sclera.  PERRL.  Extraocular muscles intact.  ENT: External ears and nose appear normal.  Oral mucosa  moist.  Neck: Supple.   No jugular venous distention.   Heart: Regular rate and rhythm.  No murmurs appreciated.  Chest: Symmetrical excursion.  Clear to auscultation bilaterally.  Abdomen: Soft, nontender.  No abnormal distention. Bowel sounds are present throughout.  Extremities: No gross deformities.   No particular edema, no cyanosis.  Feet are warm to touch.  Neurological: Being all extremities spontaneously with at least antigravity strength.  Alert, oriented X3.  Skin: No jaundice.  Patient has a clean-based quarter sized ulcer about 1  cm deep on the plantar aspect of his right foot at the location of the second metatarsal.         Lab/Data Review:     Recent Results (from the past 24 hour(s))     LACTIC ACID          Collection Time: 06/09/19 12:45 AM  Result  Value  Ref Range            Lactic acid  2.0  0.4 - 2.0 MMOL/L       MAGNESIUM          Collection Time: 06/09/19 12:45 AM         Result  Value  Ref Range            Magnesium  1.9  1.6 - 2.4 mg/dL       PHOSPHORUS          Collection Time: 06/09/19 12:45 AM         Result  Value  Ref Range            Phosphorus  2.0 (L)  2.6 - 4.7 MG/DL       METABOLIC PANEL, BASIC          Collection Time: 06/09/19 12:45 AM         Result  Value  Ref Range            Sodium  141  136 - 145 mmol/L       Potassium  4.3  3.5 - 5.1 mmol/L       Chloride  115 (H)  97 - 108 mmol/L       CO2  18 (L)  21 - 32 mmol/L       Anion gap  8  5 - 15 mmol/L       Glucose  155 (H)  65 - 100 mg/dL       BUN  18  6 - 20 MG/DL       Creatinine  1.33 (H)  0.70 - 1.30 MG/DL       BUN/Creatinine ratio  14  12 - 20         GFR est AA  >60  >60 ml/min/1.54m       GFR est non-AA  51 (L)  >60 ml/min/1.765m      Calcium  8.3 (L)  8.5 - 10.1 MG/DL       CBC W/O DIFF          Collection Time: 06/09/19 12:45 AM         Result  Value  Ref Range            WBC  7.4  4.1 - 11.1 K/uL       RBC  3.28 (L)  4.10 - 5.70 M/uL       HGB  9.2 (L)  12.1 - 17.0 g/dL       HCT  29.1 (L)  36.6 - 50.3  %       MCV  88.7  80.0 - 99.0 FL       MCH  28.0  26.0 - 34.0 PG       MCHC  31.6  30.0 - 36.5 g/dL       RDW  17.3 (H)  11.5 - 14.5 %       PLATELET  145 (L)  150 - 400 K/uL       MPV  12.5  8.9 - 12.9 FL       NRBC  0.0  0 PER 100 WBC       ABSOLUTE NRBC  0.00  0.00 - 0.01 K/uL       GLUCOSE, POC          Collection Time: 06/09/19  8:11 AM  Result  Value  Ref Range            Glucose (POC)  126 (H)  65 - 100 mg/dL       Performed by  [facility].         GLUCOSE, POC          Collection Time: 06/09/19 11:14 AM         Result  Value  Ref Range            Glucose (POC)  172 (H)  65 - 100 mg/dL       Performed by  [facility].         GLUCOSE, POC          Collection Time: 06/09/19  4:15 PM         Result  Value  Ref Range            Glucose (POC)  141 (H)  65 - 100 mg/dL            Performed by  [facility]  [place]                   Signed By:  Sue Lush, MD           June 14, 2019

## 2019-06-14 NOTE — Progress Notes (Signed)
Transition of Care: TBD;needsa SNF;[facility] have both accepted;insurance policy is questionable at this time;apparentlypatients [employer] policy [id number] expired on 3/31 and his Blue Cross/Mediblue coverage is not valid when [facility] tries to verify although it was e verified by hospital;[facility] is also running the [facility] policy at this time    Transport Plan: likely BLS(not set up yet)    RUR: 25%    Main contact- daughterBonita Quin- [phone]    Barriers to discharge:  -insurance validity    1050: patients insurance information still questionable at this time at [facility]; the other SNF accepting patient is [facility] [place]; called contact at [place]; they will run the [employer] today; will fax the insurance attachments to [facility]          CM following  Fernand Parkins, Charity fundraiser, CRM

## 2019-06-14 NOTE — Progress Notes (Signed)
Physical Therapy  Have attempted to work with patient several times and patient is refusing.  Patient states he doesn't need help to walk and "I have been walking on this foot for 2 years.  I don't need no help."  Explained to patient that he was supposed not put full weight on his foot but he resists this.  At this time patient not appropriate for PT as he refuses and feels no need.  Please re-consult if indicated.  Gerre Pebbles, PT

## 2019-06-15 LAB — SARS-COV-2: SARS-CoV-2: NOT DETECTED

## 2019-06-15 LAB — COVID-19: SARS-CoV-2: NOT DETECTED

## 2019-06-15 MED FILL — GABAPENTIN 300 MG CAP: 300 mg | ORAL | Qty: 1

## 2019-06-15 MED FILL — DOK 100 MG CAPSULE: 100 mg | ORAL | Qty: 1

## 2019-06-15 MED FILL — SUCRALFATE 1 GRAM TAB: 1 gram | ORAL | Qty: 1

## 2019-06-15 MED FILL — PANTOPRAZOLE 40 MG TAB, DELAYED RELEASE: 40 mg | ORAL | Qty: 1

## 2019-06-15 MED FILL — ACETAMINOPHEN 325 MG TABLET: 325 mg | ORAL | Qty: 2

## 2019-06-15 MED FILL — CHOLECALCIFEROL (VITAMIN D3) 1,000 UNIT (25 MCG) TAB: ORAL | Qty: 2

## 2019-06-15 NOTE — Wound Image (Signed)
WOCN Note:     Follow up visit to reassess right plantar diabetic foot ulcer  ??  Chart shows:  Patient admitted on??06/02/19??with AKI, GI bleed, positive for Covid 19    Past Medical History:   Diagnosis Date   ??? Chronic pain     diabetic neuropathy   ??? Diabetes mellitus type II, non insulin dependent (HCC)    ??? Other ill-defined conditions(799.89)     Gout   ??? Thromboembolus (HCC)      Wound Culture obtained on??06/04/19??which grew Pseudomonas Aeruginosa and Providencia Rettgeri    Xray to right foot on 4/17 shows no definite plain film evidence for osteomyelitis. CT sacn to right foot on 4/18 shows no definite evidence of acute osteomyelitis.    Assessment:   Alert and oriented x 4  Reports no pain   Mobility:??moderate??assistance with repositioning.  Continence: Incontinent of??urine and stool  Last Braden Score??21  Surface:??Total care??mattress  Diet:??Regular with Ensure supplements    Bilateral heel,buttocks, and sacral skin intact and without erythema   Heels offloaded with pillows     Lower Extremity Assessment:  ??  Pulses:??right foot with palpable pedal and posterior tibial pulse +1  Cap refill:??<3sec  Temperature:??warm  Insensate??  ??  1. POA??right plantar diabetic foot ulcer??  Full thickness??  2.2??x 1.6??x 0.4??cm  No bone palpated  Base is??100% nongranulating red tissue  serosang??exudate, moderate??amount  no??odor   unattached??edges  Periwound??with intact??&??without erythema  ??  Patient was seen by podiatry during this hospital admission    All areas appear stable or improving with no new concerns to report to physician     Wound Recommendations:      Continue current treatment of cleansing wound to right plantar foot with  wound cleanser, apply lac hydrin lotion to foot and periwound, then pack  wound bed with honey sheet, cover with plain optifoam and kerlix daily. * If need more honey sheet or optifoam, can be found on 5E  ??  Lac Hydrin 12% lotion to  bilateral feet daily, avoid applying in between toes    PI Prevention:  Turn/reposition approximately every 2 hours  Offload heels??with pillows at all times in bed.  Sacral Foam dressing: lift to assess regularly; change as needed. Discontinue if incontinent.   Hydraguard Barrier Cream to buttocks and sacrum TID ??and as needed with incontinence care   Keep HOB 30 degrees or less??to decrease shearing and pressure unless medically contraindicated. If HOB is to be over 30 degrees, raise knees first then [facility] to prevent sliding   Minimize layers of linen/pads under patient to optimize support surface to one ??sheet and one incontinence pad   ??  Patient has offloading Darco shoe in room. Pt is non-compliant with use    Discussed with RN, Candise Bowens    Transition of Care: Plan to follow weekly and as needed while admitted to hospital.     Jennye Boroughs BSN, RN, [facility], CWON  Certified Wound and Ostomy Nurse  office [phone]  pager [phone] or call operator to page

## 2019-06-15 NOTE — Progress Notes (Addendum)
Transition of Care: TBD;??needs??a SNF;??[facility] have both accepted;??insurance policy is questionable at this time;??apparently??patients [employer] policy??[id number]??expired on 3/31 and his Blue Cross/[employer] coverage is not valid??when??SNF tries to verify although it was e verified by hospital; [place] is currently trying to get AUTH with [employer] medicare  ??  Transport Plan: likely BLS??(not set up yet)  ??  RUR: 25%  ??  Main contact- daughterBonita Quin- [phone]  ??  Barriers to discharge:  -insurance validity; [place] SNF is trying to get auth  ??  1000: patients insurance information still questionable at this time:  [place] tried to run the [employer] and the birthdate (according to [employer]) is incorrect; this CM confirmed birthdate [birth year] with copy of drivers license and faxed to both [place] and [facility]; also faxed copy of Humana card to both facilities; also called patients daughter Bonita Quin to followup on what she has found out about the [employer]- the [phone] is now "out of service" and the home number [phone]-the VM is "full" and cannot accept messages;     1430: this CM got a call from [place] at [facility]; she has spoken again with med[employer]lue; apparently  the patients DOB according to medicare is 08/01/1936 when his correct DOB is 09-20-1934; a form (SNF/Rehab Precert Worksheet") from med[employer]lue was sent to [place] to be filled out and faxed back with clinicals; this CM helped fill out the form and faxed it back to [place] with all PT/OT notes, last MD progress note and H & P; Colette Ribas will finish filling it out and fax it to [employer] today and start the auth; Trula Ore thinks Berkley Harvey will be authorized on Monday 5/3    1622: this CM sent perfectserve message to attending to inform    1630: called patients daughter to update; no answer; could not leave message; VM is full  1630: called patients granddaughter to updateKellie Moor- no answer; left message for return call  ??  ??   CM following  Fernand Parkins, Charity fundraiser, CRM

## 2019-06-15 NOTE — Progress Notes (Addendum)
Progress Note    Patient: Michael Lozano MRN: [id number]  SSN: [id number]    Date of Birth: 17-Aug-1934  Age: 85 y.o.  Sex: male      Admit Date: 06/02/2019    LOS: 13 days        Patient is a pleasant 10 YOCM w/Hx DM, Diabetic neuropathy, and debility who was admitted for [place] on 02 June 2019, who has recovered, and is awaiting placement for rehab in a SNF. He is not currently getting any labs, but he is working with PT/OT and being monitored.     Awaiting insurance Authorization for placement at [facility]    06/12/2019: Patient till complaining of not enough food. He has been snacking out of a large figt box of food from his daughter    06/13/2019: Patient is under the blanket, head included, and refused to take his head out or allow a physical exam today. He states there is not enough junk food in the hospital. He needs more chips options. He has no other complaints. Case management is still working on placement    06/14/2019: Patient worked with PT today. He is still complaining that we need better food in the hospital. He is pleasant today with no further complaints.     06/15/2019: Patient has complaint of mild abdominal pain. RN gave tylenol which resolved complaint. He has a reducible periumbilical hernia on exam, otherwise normal exam. Patient has no further complaints. Case management is working on Production manager, but due to [employer] there is no placement yet.  Update from CM - patient may DC to [place] early next week    Assessment/plan:   1.  Generalized weakness/deconditioning.  Case management has been consulted for rehab placement.    2.  Recent COVID-19 virus infection.  Patient remains in contact and droplet isolation. Has taste changes and is frustrated with it    3.  Diabetes mellitus.  Currently on sliding scale insulin. Daughter sent a huge basket of food which he is still snacking out of. Glucose stabl in the normal ranges long enough, we DCd the finger sticks    4.  Acute kidney  injury present on admission is now resolved.  Creatinine appears to be close to baseline. DCd renal panels    5.  Diabetic neuropathy.  Continue gabapentin.      Current Facility-Administered Medications:   ???  ammonium lactate (LAC-HYDRIN) 12 % lotion, , Topical, DAILY, Pervis Hocking, MD, Given at 06/15/19 1022  ???  albumin human 25% (BUMINATE) solution 12.5 g, 12.5 g, IntraVENous, Q8H PRN, Posey Pronto, Deep V, MD  ???  gabapentin (NEURONTIN) capsule 300 mg, 300 mg, Oral, BID, Theodore Demark R, NP-C, 300 mg at 06/15/19 1022  ???  pantoprazole (PROTONIX) tablet 40 mg, 40 mg, Oral, BID, Theodore Demark R, NP-C, 40 mg at 06/15/19 1022  ???  cholecalciferol (VITAMIN D3) (1000 Units /25 mcg) tablet 2,000 Units, 2,000 Units, Oral, DAILY, Theodore Demark R, NP-C, 2,000 Units at 06/15/19 1022  ???  sucralfate (CARAFATE) tablet 1 g, 1 g, Oral, AC&HS, Theodore Demark R, NP-C, 1 g at 06/15/19 1156  ???  docusate sodium (COLACE) capsule 100 mg, 100 mg, Oral, DAILY, Theodore Demark R, NP-C, 100 mg at 06/14/19 0902  ???  HYDROmorphone (PF) (DILAUDID) injection 0.5 mg, 0.5 mg, IntraVENous, Q4H PRN, Arta Silence, NP, 0.5 mg at 06/05/19 0835  ???  albuterol (PROVENTIL HFA, VENTOLIN HFA, PROAIR HFA) inhaler 1 Puff, 1 Puff, Inhalation, Q4H PRN, Reginal Lutes  M, NP  ???  glucose chewable tablet 16 g, 4 Tab, Oral, PRN, Erline Levine, NP  ???  dextrose (D50W) injection syrg 12.5-25 g, 25-50 mL, IntraVENous, PRN, Erline Levine, NP  ???  glucagon (GLUCAGEN) injection 1 mg, 1 mg, IntraMUSCular, PRN, Erline Levine, NP  ???  acetaminophen (TYLENOL) tablet 650 mg, 650 mg, Oral, Q6H PRN, 650 mg at 06/11/19 2153 **OR** acetaminophen (TYLENOL) suppository 650 mg, 650 mg, Rectal, Q6H PRN, Arta Silence, NP  ???  acetaminophen (TYLENOL) tablet 650 mg, 650 mg, Oral, Q6H, Sinnatamby, Diane S, MD, 650 mg at 06/15/19 0830  ???  sodium chloride (NS) flush 5-40 mL, 5-40 mL, IntraVENous, Q8H, Lockard, Sara H, NP, 10 mL at 06/15/19 1403  ???  sodium chloride (NS) flush 5-40 mL, 5-40 mL,  IntraVENous, PRN, Lockard, Remi Deter, NP  ???  polyethylene glycol (MIRALAX) packet 17 g, 17 g, Oral, DAILY PRN, Lockard, Remi Deter, NP  ???  promethazine (PHENERGAN) tablet 12.5 mg, 12.5 mg, Oral, Q6H PRN **OR** ondansetron (ZOFRAN) injection 4 mg, 4 mg, IntraVENous, Q6H PRN, Lockard, Remi Deter, NP, 4 mg at 06/05/19 1320    Interval summary:  Patient was admitted to the intensive care unit on 06/02/2019 after presenting to the emergency department with acute kidney injury, hypernatremia.  Patient had been recently diagnosed with COVID-19 on 05/18/2019, and tested positive again on 06/02/2019.  Per emergency department records, P "He has had a gradual decline, very poor p.o. intake to the point that he has not been eating or drinking for several days. ??He also is somnolent and less interactive. ??He has not fallen or had recurrent fevers".  Patient was also hypothermic and hypotensive at admission.  There was concern for sepsis, and he was started on IV fluids, and initial antibiotics IV cefepime and IV vancomycin.  Mild coffee-ground emesis was noted via nasogastric tube and gastroenterologist was consulted.  They recommended supportive care with either PPI twice daily or Pepcid twice daily, and felt that EGD was not indicated.  He has been followed by infectious disease specialist.  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  However, cultures on 05/17/2019 from the wound was positive for light E.cloacae complex, Providencia rettgeri sensitive to cefepime.   ID recommended Zyvox instead of vancomycin due to the potential contribution to nephrotoxicity.  IV Flagyl was added for concern for aspiration, and continued until 06/08/2019.  It was documented by ID on 06/06/2019 that  organisms in wound culture were probably colonization. urine cultures came back positive for  Pseudomonas and ecoli. Infectious disease specialist has been following  throughout, and commended to continue IV cefepime for 1 week, stopping on the night of 06/09/2019.      06/10/2019: Patient's only complaint is he needs mac and cheese, potato chips and pepsi. He said sugar  Has nothing to do with it. He is happy about the basket his daughter brought him.     Subjective:     Patient has no complaints at this time, except for feeling weak.  He was living at home with family, and in this home, there are stairs which states that he certainly cannot manage at this time.  Patient states that he usually ambulates without any assistive devices.  Patient is interested in short-term inpatient rehab possibly skilled nursing facility long-term.  Patient states that he sometimes has pain in the right foot, but it is  much improved.  Patient denies any pain anywhere else, nausea, vomiting, shortness of breath.  Patient has right plantar ulcer which is clean-based on exam.  Patient has not had any fever per review of vital signs for the last 48 hours.    Objective:     Vitals:    06/09/19 0707 06/09/19 1113 06/09/19 1304 06/09/19 1442   BP: (!) 107/52 125/63 119/69 111/61   Pulse: 60 67 69 61   Resp: _0 Temp: 98 ??F (36.7 ??C) 97.8 ??F (36.6 ??C) 98.2 ??F (36.8 ??C) 98.6 ??F (37 ??C)   SpO2: 98% 97% 98% 96%   Oxygen therapy  room air  room air  room air  room air        Intake and Output:  Current Shift: No intake/output data recorded.  Last three shifts: No intake/output data recorded.    Physical Exam:   Estimated body mass index is 29.04 kg/m?? as calculated from the following:    Height as of this encounter: _1  (1.778 m).    Weight as of this encounter: 91.8 kg (202 lb 6.4 oz).   General: In no acute distress.  Well developed, well nourished.  Head: Normocephalic, atraumatic.  Eyes: Anicteric sclera.  PERRL.  Extraocular muscles intact.  ENT: External ears and nose appear normal.  Oral mucosa moist.  Neck: Supple.  No jugular venous distention.  Heart: Regular rate and rhythm.  No murmurs  appreciated.  Chest: Symmetrical excursion.  Clear to auscultation bilaterally.  Abdomen: Soft, nontender.  No abnormal distention. Bowel sounds are present throughout.  Extremities: No gross deformities.  No particular edema, no cyanosis.  Feet are warm to touch.  Neurological: Being all extremities spontaneously with at least antigravity strength.  Alert, oriented X3.  Skin: No jaundice.  Patient has a clean-based quarter sized ulcer about 1 cm deep on the plantar aspect of his right foot at the location of the second metatarsal.      Lab/Data Review:  Recent Results (from the past 24 hour(s))   LACTIC ACID    Collection Time: 06/09/19 12:45 AM   Result Value Ref Range    Lactic acid 2.0 0.4 - 2.0 MMOL/L   MAGNESIUM    Collection Time: 06/09/19 12:45 AM   Result Value Ref Range    Magnesium 1.9 1.6 - 2.4 mg/dL   PHOSPHORUS    Collection Time: 06/09/19 12:45 AM   Result Value Ref Range    Phosphorus 2.0 (L) 2.6 - 4.7 MG/DL   METABOLIC PANEL, BASIC    Collection Time: 06/09/19 12:45 AM   Result Value Ref Range    Sodium 141 136 - 145 mmol/L    Potassium 4.3 3.5 - 5.1 mmol/L    Chloride 115 (H) 97 - 108 mmol/L    CO2 18 (L) 21 - 32 mmol/L    Anion gap 8 5 - 15 mmol/L    Glucose 155 (H) 65 - 100 mg/dL    BUN 18 6 - 20 MG/DL    Creatinine 1.33 (H) 0.70 - 1.30 MG/DL    BUN/Creatinine ratio 14 12 - 20      GFR est AA >60 >60 ml/min/1.63m    GFR est non-AA 51 (L) >60 ml/min/1.787m   Calcium 8.3 (L) 8.5 - 10.1 MG/DL   CBC W/O DIFF    Collection Time: 06/09/19 12:45 AM   Result Value Ref Range    WBC 7.4 4.1 - 11.1 K/uL  RBC 3.28 (L) 4.10 - 5.70 M/uL    HGB 9.2 (L) 12.1 - 17.0 g/dL    HCT 29.1 (L) 36.6 - 50.3 %    MCV 88.7 80.0 - 99.0 FL    MCH 28.0 26.0 - 34.0 PG    MCHC 31.6 30.0 - 36.5 g/dL    RDW 17.3 (H) 11.5 - 14.5 %    PLATELET 145 (L) 150 - 400 K/uL    MPV 12.5 8.9 - 12.9 FL    NRBC 0.0 0 PER 100 WBC    ABSOLUTE NRBC 0.00 0.00 - 0.01 K/uL   GLUCOSE, POC    Collection Time: 06/09/19  8:11 AM   Result Value Ref  Range    Glucose (POC) 126 (H) 65 - 100 mg/dL    Performed by [employer].    GLUCOSE, POC    Collection Time: 06/09/19 11:14 AM   Result Value Ref Range    Glucose (POC) 172 (H) 65 - 100 mg/dL    Performed by [employer].    GLUCOSE, POC    Collection Time: 06/09/19  4:15 PM   Result Value Ref Range    Glucose (POC) 141 (H) 65 - 100 mg/dL    Performed by [place]          Signed By: Sue Lush, MD     June 15, 2019

## 2019-06-15 NOTE — Progress Notes (Signed)
Spiritual Care Assessment/Progress Note  [facility]      NAME: Michael Lozano      MRN: [id number]  AGE: 84 y.o. SEX: male  Religious Affiliation: Christian   Language: English     06/15/2019     Total Time (in minutes): 19     Spiritual Assessment begun in [facility] through conversation with:         [x]Patient        [] Family    [] Friend(s)        Reason for Consult: Initial/Spiritual assessment, patient floor     Spiritual beliefs: (Please include comment if needed)     [] Identifies with a faith tradition:         [] Supported by a faith community:            [] Claims no spiritual orientation:           [] Seeking spiritual identity:                [] Adheres to an individual form of spirituality:           [x] Not able to assess:                           Identified resources for coping:      [] Prayer                               [] Music                  [] Guided Imagery     [] Family/friends                 [] Pet visits     [] Devotional reading                         [x] Unknown     [] Other:                                               Interventions offered during this visit: (See comments for more details)                Plan of Care:     [] Support spiritual and/or cultural needs    [] Support AMD and/or advance care planning process      [] Support grieving process   [] Coordinate Rites and/or Rituals    [] Coordination with community clergy   [] No spiritual needs identified at this time   [] Detailed Plan of Care below (See Comments)  [] Make referral to Music Therapy  [] Make referral to Pet Therapy     [] Make referral to Addiction services  [] Make referral to [facility]  [] Make referral to Spiritual Care Partner  [] No future visits requested        [x] Follow up upon further referrals     Comments: Chaplain for initial visit. Pt has contact precautions so chaplain called pt's room phone. Pt did not answer. Chaplain talked with pt's RN. Please contact Spiritual Care  Services for further support.     Chaplain Whitney Caswell   [place], M.Div, MACE   [phone])

## 2019-06-15 NOTE — Progress Notes (Signed)
Problem: Falls - Risk of  Goal: *Absence of Falls  Description: Document Michael Lozano Fall Risk and appropriate interventions in the flowsheet.  Outcome: Progressing Towards Goal  Note: Fall Risk Interventions:  Mobility Interventions: Communicate number of staff needed for ambulation/transfer    Mentation Interventions: Bed/chair exit alarm, Door open when patient unattended, Toileting rounds, Update white board    Medication Interventions: Patient to call before getting OOB    Elimination Interventions: Call light in reach, Bed/chair exit alarm, Stay With Me (per policy), Patient to call for help with toileting needs, Toileting schedule/hourly rounds    History of Falls Interventions: Bed/chair exit alarm, Door open when patient unattended         Problem: Patient Education: Go to Patient Education Activity  Goal: Patient/Family Education  Outcome: Progressing Towards Goal     Problem: Pressure Injury - Risk of  Goal: *Prevention of pressure injury  Description: Document Braden Scale and appropriate interventions in the flowsheet.  Outcome: Progressing Towards Goal  Note: Pressure Injury Interventions:  Sensory Interventions: Assess changes in LOC    Moisture Interventions: Minimize layers, Moisture barrier, Maintain skin hydration (lotion/cream), Absorbent underpads    Activity Interventions: Increase time out of bed    Mobility Interventions: Chair cushion, Assess need for specialty bed, Turn and reposition approx. every two hours(pillow and wedges)    Nutrition Interventions: Document food/fluid/supplement intake    Friction and Shear Interventions: HOB 30 degrees or less                Problem: Patient Education: Go to Patient Education Activity  Goal: Patient/Family Education  Outcome: Progressing Towards Goal     Problem: Discharge Planning  Goal: *Discharge to safe environment  Outcome: Progressing Towards Goal     Problem: Patient Education: Go to Patient Education Activity  Goal: Patient/Family Education   Outcome: Progressing Towards Goal

## 2019-06-15 NOTE — Progress Notes (Signed)
Problem: Self Care Deficits Care Plan (Adult)  Goal: *Acute Goals and Plan of Care (Insert Text)  Description:   FUNCTIONAL STATUS PRIOR TO ADMISSION: Patient was modified independent using a cane for functional mobility, was modified independent for basic and instrumental ADLs however increased debility over the past couple of weeks requiring increased assist for ADLs/IADLs and mobility.     HOME SUPPORT: The patient lived with granddaughter and her significant other but did not require assist until recently.    Occupational Therapy Goals  Weekly Re-Assessment 06/12/19, goals modified below  Initiated 06/06/2019  1.  Patient will perform grooming at the sink with minimal assistance/contact guard assist within 7 day(s). UPGRADED to SBA 4/27  2.  Patient will perform bathing with minimal assistance/contact guard assist within 7 day(s). CONTINUE  3.  Patient will perform lower body dressing with minimal assistance/contact guard assist within 7 day(s). MET, UPGRADED to SBA 4/27  4.  Patient will perform toilet transfers with minimal assistance/contact guard assist within 7 day(s). MET, UPGRADED to SBA 4/27  5.  Patient will perform all aspects of toileting with minimal assistance/contact guard assist within 7 day(s). MET, UPGRADED to SBA 4/27  6.  Patient will participate in upper extremity therapeutic exercise/activities with supervision/set-up for 5 minutes within 7 day(s).  CONTINUE  7.  Patient will utilize energy conservation techniques during functional activities with verbal and visual cues within 7 day(s). CONTINUE            Outcome: Not Met    OCCUPATIONAL THERAPY TREATMENT/DISCHARGE  Patient: Michael Lozano (85 y.o. male)  Date: 06/15/2019  Diagnosis: AKI (acute kidney injury) ([place]) [N17.9]  Hypernatremia [E87.0]  GI bleed [K92.2] <principal problem not specified>       Precautions: PWB(RLE in off loading shoe)  Chart, occupational therapy assessment, plan of care, and goals were reviewed.    ASSESSMENT   Patient continues with skilled OT services and has not progressed towards goals. Pt continues to present with decreased safety awareness, decreased insight into deficits and need to wear offloading shoe on R foot. Pt pleasant with OT today, but refused to don Darco Wedge and his regular shoes in prep for OOB activity. He also refused to use RW. Pt with overall fair mobility to bathroom without walker where he performed ADL at sink with intact balance. Pt has been up around room without AD nor his wedge shoe. Pt stated he adheres to his partial WB (heel) but this was not observed during session. Despite extension education on safety concerns without RW or offloading shoe, pt continued to disregard OT's suggestions. He is not receptive at this time to any education. Will sign off from acute OT.    Current Level of Function (ADLs/self-care): supervision to SBA    Other factors to consider for discharge: non healing R foot wound, refusal to wear offloading shoe nor use RW         PLAN :  Rationale for discharge: Patient not participating in therapy  Recommend with staff: OOB to chair for meals, encourage RW as able  Recommendation for discharge: (in order for the patient to meet his/her long term goals)  No skilled therapy for OT    This discharge recommendation:  Has not yet been discussed the attending provider and/or case management    IF patient discharges home will need the following VFI:EPPI       SUBJECTIVE:   Patient stated ???you know what. I used to have this at home. Guess where  it is now? The dumpster." (regarding his RW when OT encouraged pt to use his walker to mobilize to bathroom. Pt pleasant with Pryor Curia but just adamant he didn't need it)    OBJECTIVE DATA SUMMARY:   Cognitive/Behavioral Status:  Neurologic State: Alert  Orientation Level: Oriented X4  Cognition: Follows commands             Functional Mobility and Transfers for ADLs:  Bed Mobility:  Supine to Sit: Supervision  Sit to Supine: Supervision     Transfers:  Sit to Stand: Stand-by assistance     Bed to Chair: Stand-by assistance    Balance:  Sitting: Intact;Without support  Standing: Impaired;Without support  Standing - Static: Fair  Standing - Dynamic : Fair    ADL Intervention:       Grooming  Brushing Teeth: Supervision                 Pain:  none    Activity Tolerance:   Good    After treatment patient left in no apparent distress:   Supine in bed, Heels elevated for pressure relief, Call bell within reach, Bed / chair alarm activated, and Side rails x 3    COMMUNICATION/COLLABORATION:   The patient???s plan of care was discussed with: Physical therapist and Registered nurse.     Franne Forts, OT  Time Calculation: 12 mins

## 2019-06-15 NOTE — Progress Notes (Signed)
0014: Attempted to swab for COVID, pt refusing at this time. Will attempt again in the morning.    Stephanie Dowdy, RN

## 2019-06-15 NOTE — Progress Notes (Signed)
0014: Attempted to swab for COVID, pt refusing at this time. Will attempt again in the morning.    Ardeen Jourdain, RN

## 2019-06-15 NOTE — Progress Notes (Signed)
Spiritual Care Assessment/Progress Note  [facility]      NAME: Michael Lozano      MRN: [id number]  AGE: 85 y.o. SEX: male  Religious Affiliation: Christian   Language: English     06/15/2019     Total Time (in minutes): 19     Spiritual Assessment begun in [facility] 6E MED ONCOLOGY through conversation with:         [x] Patient        []  Family    []  Friend(s)        Reason for Consult: Initial/Spiritual assessment, patient floor     Spiritual beliefs: (Please include comment if needed)     []  Identifies with a faith tradition:         []  Supported by a faith community:            []  Claims no spiritual orientation:           []  Seeking spiritual identity:                []  Adheres to an individual form of spirituality:           [x]  Not able to assess:                           Identified resources for coping:      []  Prayer                               []  Music                  []  Guided Imagery     []  Family/friends                 []  Pet visits     []  Devotional reading                         [x]  Unknown     []  Other:                                               Interventions offered during this visit: (See comments for more details)                Plan of Care:     []  Support spiritual and/or cultural needs    []  Support AMD and/or advance care planning process      []  Support grieving process   []  Coordinate Rites and/or Rituals    []  Coordination with community clergy   []  No spiritual needs identified at this time   []  Detailed Plan of Care below (See Comments)  []  Make referral to Music Therapy  []  Make referral to Pet Therapy     []  Make referral to Addiction services  []  Make referral to [facility] Passages  []  Make referral to Spiritual Care Partner  []  No future visits requested        [x]  Follow up upon further referrals     Comments: Chaplain for initial visit. Pt has contact precautions so chaplain called pt's room phone. Pt did not answer. Chaplain talked with pt's RN. Please contact Spiritual Care  Services for further support.     Chaplain  Daou, M.Div, MACE   [phone])

## 2019-06-15 NOTE — Progress Notes (Signed)
Progress Notes by Sue Lush, MD at 06/15/19 1423                Author: Sue Lush, MD  Service: Hospitalist  Author Type: Physician       Filed: 06/15/19 1717  Date of Service: 06/15/19 1423  Status: Addendum          Editor: Sue Lush, MD (Physician)          Related Notes: Original Note by Sue Lush, MD (Physician) filed at 06/15/19 1424                               Progress Note          Patient: Michael Lozano  MRN: [id number]    SSN: [id number]           Date of Birth: 01-24-1935    Age: 85 y.o.    Sex: male          Admit Date: 06/02/2019      LOS: 13 days           Patient is a pleasant 36 YOCM w/Hx DM, Diabetic neuropathy, and debility who was admitted for [place] on 02 June 2019, who has recovered, and is awaiting placement for rehab in a SNF. He is not currently getting any labs, but he is working with PT/OT and  being monitored.       Awaiting insurance Authorization for placement at [facility]      06/12/2019: Patient till complaining of not enough food. He has been snacking out of a large figt box of food from his daughter      06/13/2019: Patient is under the blanket, head included, and refused to take his head out or allow a physical exam today. He states there is not enough junk food in the hospital. He needs more chips options. He has no other complaints. Case management  is still working on placement      06/14/2019: Patient worked with PT today. He is still complaining that we need better food in the hospital. He is pleasant today with no further complaints.       06/15/2019: Patient has complaint of mild abdominal pain. RN gave tylenol which resolved complaint. He has a reducible periumbilical hernia on exam, otherwise normal exam. Patient has no further complaints. Case management is working on Production manager, but due  to [employer] there is no placement yet.  Update from CM - patient may DC to [place] early next week        Assessment/plan :     1.  Generalized  weakness/deconditioning.  Case management has been consulted for rehab placement.      2.  Recent COVID-19 virus infection.  Patient remains in contact and droplet isolation. Has taste changes and is frustrated with it      3.  Diabetes mellitus.  Currently on sliding scale insulin. Daughter sent a huge basket of food which he is still snacking out of. Glucose stabl in the normal ranges long enough, we DCd the finger sticks      4.  Acute kidney injury present on admission is now resolved.  Creatinine appears to be close to baseline. DCd renal panels      5.  Diabetic neuropathy.  Continue gabapentin.         Current Facility-Administered Medications:    ?  ammonium lactate (LAC-HYDRIN) 12 % lotion, ,  Topical, DAILY, Pervis Hocking, MD, Given at 06/15/19 1022   ?  albumin human 25% (BUMINATE) solution 12.5 g, 12.5 g, IntraVENous, Q8H PRN, Patel, Deep V, MD   ?  gabapentin (NEURONTIN) capsule 300 mg, 300 mg, Oral, BID, Theodore Demark R, NP-C, 300 mg at 06/15/19 1022   ?  pantoprazole (PROTONIX) tablet 40 mg, 40 mg, Oral, BID, Theodore Demark R, NP-C, 40 mg at 06/15/19 1022   ?  cholecalciferol (VITAMIN D3) (1000 Units /25 mcg) tablet 2,000 Units, 2,000 Units, Oral, DAILY, Theodore Demark R, NP-C, 2,000 Units at 06/15/19 1022   ?  sucralfate (CARAFATE) tablet 1 g, 1 g, Oral, AC&HS, Theodore Demark R, NP-C, 1 g at 06/15/19 1156   ?  docusate sodium (COLACE) capsule 100 mg, 100 mg, Oral, DAILY, Theodore Demark R, NP-C, 100 mg at 06/14/19 6962   ?  HYDROmorphone (PF) (DILAUDID) injection 0.5 mg, 0.5 mg, IntraVENous, Q4H PRN, Arta Silence, NP, 0.5 mg at 06/05/19 0835   ?  albuterol (PROVENTIL HFA, VENTOLIN HFA, PROAIR HFA) inhaler 1 Puff, 1 Puff, Inhalation, Q4H PRN, Erline Levine, NP   ?  glucose chewable tablet 16 g, 4 Tab, Oral, PRN, Erline Levine, NP   ?  dextrose (D50W) injection syrg 12.5-25 g, 25-50 mL, IntraVENous, PRN, Erline Levine, NP   ?  glucagon (GLUCAGEN) injection 1 mg, 1 mg, IntraMUSCular, PRN, Erline Levine, NP   ?  acetaminophen (TYLENOL) tablet 650 mg, 650 mg, Oral, Q6H PRN, 650 mg at 06/11/19 2153 **OR** acetaminophen (TYLENOL) suppository 650 mg, 650 mg, Rectal, Q6H PRN, Arta Silence, NP   ?  acetaminophen (TYLENOL) tablet 650 mg, 650 mg, Oral, Q6H, Sinnatamby, Diane S, MD, 650 mg at 06/15/19 0830   ?  sodium chloride (NS) flush 5-40 mL, 5-40 mL, IntraVENous, Q8H, Lockard, Sara H, NP, 10 mL at 06/15/19 1403   ?  sodium chloride (NS) flush 5-40 mL, 5-40 mL, IntraVENous, PRN, Lockard, Remi Deter, NP   ?  polyethylene glycol (MIRALAX) packet 17 g, 17 g, Oral, DAILY PRN, Lockard, Remi Deter, NP   ?  promethazine (PHENERGAN) tablet 12.5 mg, 12.5 mg, Oral, Q6H PRN **OR** ondansetron (ZOFRAN) injection 4 mg, 4 mg, IntraVENous, Q6H PRN, Lockard, Remi Deter, NP, 4 mg at 06/05/19 1320      Interval summary:   Patient was admitted to the intensive care unit on 06/02/2019 after presenting to the emergency department with acute kidney injury, hypernatremia.  Patient had been recently diagnosed with COVID-19  on 05/18/2019, and tested positive again on 06/02/2019.  Per emergency department records, P "He has had a gradual decline, very poor p.o. intake to the point that he has not been eating or drinking for several days. ??He also is somnolent and less interactive.  ??He has not fallen or had recurrent fevers".  Patient was also hypothermic and hypotensive at admission.  There was concern for sepsis,  and he was started on IV fluids, and initial antibiotics IV cefepime and IV vancomycin.  Mild coffee-ground emesis was noted via nasogastric tube and gastroenterologist was consulted.  They recommended supportive care with either PPI twice daily or Pepcid  twice daily, and felt that EGD was not indicated.  He has been followed by infectious disease specialist.  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  However,  cultures on 05/17/2019 from the  wound was positive for  light E.cloacae complex, Providencia rettgeri sensitive to cefepime.    ID recommended Zyvox instead of vancomycin due to the potential contribution to nephrotoxicity.  IV Flagyl was added for concern for aspiration, and continued until 06/08/2019.  It was documented by  ID on 06/06/2019 that  organisms in wound culture were probably colonization. urine cultures came back positive for  Pseudomonas and ecoli. Infectious disease specialist has been following throughout, and commended to continue IV cefepime for 1 week, stopping  on the night of 06/09/2019.         06/10/2019: Patient's only complaint is he needs mac and cheese, potato chips and pepsi. He said sugar  Has nothing to do with it. He is happy about the basket his daughter brought him.         Subjective:        Patient has no complaints at this time, except for feeling weak.  He was living at home with family, and in this home, there are stairs which states that he certainly cannot manage at this time.  Patient  states that he usually ambulates without any assistive devices.  Patient is interested in short-term inpatient rehab possibly skilled nursing facility long-term.  Patient states that he sometimes has pain in the right foot, but it is much improved.  Patient  denies any pain anywhere else, nausea, vomiting, shortness of breath.  Patient has right plantar ulcer which is clean-based on exam.  Patient has not had any fever per review of vital signs for the last 48 hours.        Objective:          Vitals:             06/09/19 0707  06/09/19 1113  06/09/19 1304  06/09/19 1442           BP:  (!) 107/52  125/63  119/69  111/61     Pulse:  60  67  69  61     Resp:  _0 Temp:  98 ??F (36.7 ??C)  97.8 ??F (36.6 ??C)  98.2 ??F (36.8 ??C)  98.6 ??F (37 ??C)     SpO2:  98%  97%  98%  96%     Oxygen therapy   room air   room air   room air   room air            Intake and Output:   Current Shift: No intake/output data recorded.   Last  three shifts: No intake/output data recorded.      Physical Exam:    Estimated body mass index is 29.04 kg/m?? as calculated from the following:     Height as of this encounter: _1  (1.778 m).     Weight as of this encounter: 91.8 kg (202 lb 6.4 oz).    General: In no acute distress.  Well developed, well nourished.  Head: Normocephalic, atraumatic.  Eyes: Anicteric sclera.  PERRL.  Extraocular muscles intact.  ENT: External ears and nose appear normal.  Oral mucosa moist.  Neck: Supple.   No jugular venous distention.   Heart: Regular rate and rhythm.  No murmurs appreciated.  Chest: Symmetrical excursion.  Clear to auscultation bilaterally.  Abdomen: Soft, nontender.  No abnormal distention. Bowel sounds are present throughout.  Extremities: No gross deformities.   No particular edema, no cyanosis.  Feet are warm to touch.  Neurological: Being all extremities spontaneously with  at least antigravity strength.  Alert, oriented X3.  Skin: No jaundice.  Patient has a clean-based quarter sized ulcer about 1  cm deep on the plantar aspect of his right foot at the location of the second metatarsal.         Lab/Data Review:     Recent Results (from the past 24 hour(s))     LACTIC ACID          Collection Time: 06/09/19 12:45 AM         Result  Value  Ref Range            Lactic acid  2.0  0.4 - 2.0 MMOL/L       MAGNESIUM          Collection Time: 06/09/19 12:45 AM         Result  Value  Ref Range            Magnesium  1.9  1.6 - 2.4 mg/dL       PHOSPHORUS          Collection Time: 06/09/19 12:45 AM         Result  Value  Ref Range            Phosphorus  2.0 (L)  2.6 - 4.7 MG/DL       METABOLIC PANEL, BASIC          Collection Time: 06/09/19 12:45 AM         Result  Value  Ref Range            Sodium  141  136 - 145 mmol/L       Potassium  4.3  3.5 - 5.1 mmol/L       Chloride  115 (H)  97 - 108 mmol/L       CO2  18 (L)  21 - 32 mmol/L       Anion gap  8  5 - 15 mmol/L       Glucose  155 (H)  65 - 100 mg/dL       BUN  18   6 - 20 MG/DL       Creatinine  1.33 (H)  0.70 - 1.30 MG/DL       BUN/Creatinine ratio  14  12 - 20         GFR est AA  >60  >60 ml/min/1.24m       GFR est non-AA  51 (L)  >60 ml/min/1.71m      Calcium  8.3 (L)  8.5 - 10.1 MG/DL       CBC W/O DIFF          Collection Time: 06/09/19 12:45 AM         Result  Value  Ref Range            WBC  7.4  4.1 - 11.1 K/uL       RBC  3.28 (L)  4.10 - 5.70 M/uL       HGB  9.2 (L)  12.1 - 17.0 g/dL       HCT  29.1 (L)  36.6 - 50.3 %       MCV  88.7  80.0 - 99.0 FL       MCH  28.0  26.0 - 34.0 PG       MCHC  31.6  30.0 - 36.5 g/dL       RDW  17.3 (H)  11.5 -  14.5 %       PLATELET  145 (L)  150 - 400 K/uL       MPV  12.5  8.9 - 12.9 FL       NRBC  0.0  0 PER 100 WBC       ABSOLUTE NRBC  0.00  0.00 - 0.01 K/uL       GLUCOSE, POC          Collection Time: 06/09/19  8:11 AM         Result  Value  Ref Range            Glucose (POC)  126 (H)  65 - 100 mg/dL       Performed by  [facility].         GLUCOSE, POC          Collection Time: 06/09/19 11:14 AM         Result  Value  Ref Range            Glucose (POC)  172 (H)  65 - 100 mg/dL       Performed by  [facility].         GLUCOSE, POC          Collection Time: 06/09/19  4:15 PM         Result  Value  Ref Range            Glucose (POC)  141 (H)  65 - 100 mg/dL            Performed by  [place]                   Signed By:  Sue Lush, MD           June 15, 2019

## 2019-06-15 NOTE — Progress Notes (Signed)
Transition of Care: TBD;needsa SNF;[facility] have both accepted;insurance policy is questionable at this time;apparentlypatients Humana [id number] expired on 3/31 and his Blue Cross/[employer] coverage is not validwhenSNF tries to verify although it was e verified by hospital; [place] is currently trying to get AUTH with [employer] medicare    Transport Plan: likely BLS(not set up yet)    RUR: 25%    Main contact- daughterBonita Quin- [phone]    Barriers to discharge:  -insurance validity; [place] SNF is trying to get auth    1000: patients insurance information still questionable at this time:  [place] tried to run the [employer] and the birthdate (according to [employer]) is incorrect; this CM confirmed birthdate 03-14-1934 with copy of drivers license and faxed to both [place] and [facility]; also faxed copy of Humana card to both facilities; also called patients daughter Bonita Quin to followup on what she has found out about the [employer]- the [phone] is now "out of service" and the home number [phone]-the VM is "full" and cannot accept messages;     1430: this CM got a call from [place] at [place]; she has spoken again with med[employer]lue; apparently  the patients DOB according to medicare is 08/01/1936 when his correct DOB is 1934-10-12; a form (SNF/Rehab Precert Worksheet") from med[employer]lue was sent to [place] to be filled out and faxed back with clinicals; this CM helped fill out the form and faxed it back to [place] with all PT/OT notes, last MD progress note and H & P; Colette Ribas will finish filling it out and fax it to [employer] today and start the auth; Trula Ore thinks Berkley Harvey will be authorized on Monday 5/3    1622: this CM sent perfectserve message to attending to inform    1630: called patients daughter to update; no answer; could not leave message; VM is full  1630: called patients granddaughter to updateKellie Moor- no answer; left message for return  call      CM following  Fernand Parkins, Charity fundraiser, CRM

## 2019-06-15 NOTE — Progress Notes (Signed)
Problem: Falls - Risk of  Goal: *Absence of Falls  Description: Document Michael Lozano Fall Risk and appropriate interventions in the flowsheet.  Outcome: Progressing Towards Goal  Note: Fall Risk Interventions:  Mobility Interventions: Communicate number of staff needed for ambulation/transfer    Mentation Interventions: Bed/chair exit alarm, Door open when patient unattended, Toileting rounds, Update white board    Medication Interventions: Patient to call before getting OOB    Elimination Interventions: Call light in reach, Bed/chair exit alarm, Stay With Me (per policy), Patient to call for help with toileting needs, Toileting schedule/hourly rounds    History of Falls Interventions: Bed/chair exit alarm, Door open when patient unattended         Problem: Patient Education: Go to Patient Education Activity  Goal: Patient/Family Education  Outcome: Progressing Towards Goal     Problem: Pressure Injury - Risk of  Goal: *Prevention of pressure injury  Description: Document Braden Scale and appropriate interventions in the flowsheet.  Outcome: Progressing Towards Goal  Note: Pressure Injury Interventions:  Sensory Interventions: Assess changes in LOC    Moisture Interventions: Minimize layers, Moisture barrier, Maintain skin hydration (lotion/cream), Absorbent underpads    Activity Interventions: Increase time out of bed    Mobility Interventions: Chair cushion, Assess need for specialty bed, Turn and reposition approx. every two hours(pillow and wedges)    Nutrition Interventions: Document food/fluid/supplement intake    Friction and Shear Interventions: HOB 30 degrees or less                Problem: Patient Education: Go to Patient Education Activity  Goal: Patient/Family Education  Outcome: Progressing Towards Goal     Problem: Discharge Planning  Goal: *Discharge to safe environment  Outcome: Progressing Towards Goal     Problem: Patient Education: Go to Patient Education Activity  Goal: Patient/Family  Education  Outcome: Progressing Towards Goal

## 2019-06-15 NOTE — Progress Notes (Signed)
Problem: Self Care Deficits Care Plan (Adult)  Goal: *Acute Goals and Plan of Care (Insert Text)  Description:   FUNCTIONAL STATUS PRIOR TO ADMISSION: Patient was modified independent using a cane for functional mobility, was modified independent for basic and instrumental ADLs however increased debility over the past couple of weeks requiring increased assist for ADLs/IADLs and mobility.     HOME SUPPORT: The patient lived with granddaughter and her significant other but did not require assist until recently.    Occupational Therapy Goals  Weekly Re-Assessment 06/12/19, goals modified below  Initiated 06/06/2019  1.  Patient will perform grooming at the sink with minimal assistance/contact guard assist within 7 day(s). UPGRADED to SBA 4/27  2.  Patient will perform bathing with minimal assistance/contact guard assist within 7 day(s). CONTINUE  3.  Patient will perform lower body dressing with minimal assistance/contact guard assist within 7 day(s). MET, UPGRADED to SBA 4/27  4.  Patient will perform toilet transfers with minimal assistance/contact guard assist within 7 day(s). MET, UPGRADED to SBA 4/27  5.  Patient will perform all aspects of toileting with minimal assistance/contact guard assist within 7 day(s). MET, UPGRADED to SBA 4/27  6.  Patient will participate in upper extremity therapeutic exercise/activities with supervision/set-up for 5 minutes within 7 day(s).  CONTINUE  7.  Patient will utilize energy conservation techniques during functional activities with verbal and visual cues within 7 day(s). CONTINUE            Outcome: Not Met    OCCUPATIONAL THERAPY TREATMENT/DISCHARGE  Patient: Michael Lozano (85 y.o. male)  Date: 06/15/2019  Diagnosis: AKI (acute kidney injury) (HCC) [N17.9]  Hypernatremia [E87.0]  GI bleed [K92.2] <principal problem not specified>       Precautions: PWB(RLE in off loading shoe)  Chart, occupational therapy assessment, plan of care, and goals were  reviewed.    ASSESSMENT  Patient continues with skilled OT services and has not progressed towards goals. Pt continues to present with decreased safety awareness, decreased insight into deficits and need to wear offloading shoe on R foot. Pt pleasant with OT today, but refused to don Darco Wedge and his regular shoes in prep for OOB activity. He also refused to use RW. Pt with overall fair mobility to bathroom without walker where he performed ADL at sink with intact balance. Pt has been up around room without AD nor his wedge shoe. Pt stated he adheres to his partial WB (heel) but this was not observed during session. Despite extension education on safety concerns without RW or offloading shoe, pt continued to disregard OT's suggestions. He is not receptive at this time to any education. Will sign off from acute OT.    Current Level of Function (ADLs/self-care): supervision to SBA    Other factors to consider for discharge: non healing R foot wound, refusal to wear offloading shoe nor use RW         PLAN :  Rationale for discharge: Patient not participating in therapy  Recommend with staff: OOB to chair for meals, encourage RW as able  Recommendation for discharge: (in order for the patient to meet his/her long term goals)  No skilled therapy for OT    This discharge recommendation:  Has not yet been discussed the attending provider and/or case management    IF patient discharges home will need the following XWR:UEAV       SUBJECTIVE:   Patient stated "you know what. I used to have this at home. Guess where  it is now? The dumpster." (regarding his RW when OT encouraged pt to use his walker to mobilize to bathroom. Pt pleasant with Michael Lozano but just adamant he didn't need it)    OBJECTIVE DATA SUMMARY:   Cognitive/Behavioral Status:  Neurologic State: Alert  Orientation Level: Oriented X4  Cognition: Follows commands             Functional Mobility and Transfers for ADLs:  Bed Mobility:  Supine to Sit:  Supervision  Sit to Supine: Supervision    Transfers:  Sit to Stand: Stand-by assistance     Bed to Chair: Stand-by assistance    Balance:  Sitting: Intact;Without support  Standing: Impaired;Without support  Standing - Static: Fair  Standing - Dynamic : Fair    ADL Intervention:       Grooming  Brushing Teeth: Supervision                 Pain:  none    Activity Tolerance:   Good    After treatment patient left in no apparent distress:   Supine in bed, Heels elevated for pressure relief, Call bell within reach, Bed / chair alarm activated, and Side rails x 3    COMMUNICATION/COLLABORATION:   The patient's plan of care was discussed with: Physical therapist and Registered nurse.     Haze Rushing, OT  Time Calculation: 12 mins

## 2019-06-16 MED FILL — CHOLECALCIFEROL (VITAMIN D3) 1,000 UNIT (25 MCG) TAB: ORAL | Qty: 1

## 2019-06-16 MED FILL — GABAPENTIN 300 MG CAP: 300 mg | ORAL | Qty: 1

## 2019-06-16 MED FILL — ACETAMINOPHEN 325 MG TABLET: 325 mg | ORAL | Qty: 2

## 2019-06-16 MED FILL — SUCRALFATE 1 GRAM TAB: 1 gram | ORAL | Qty: 1

## 2019-06-16 MED FILL — PANTOPRAZOLE 40 MG TAB, DELAYED RELEASE: 40 mg | ORAL | Qty: 1

## 2019-06-16 MED FILL — DOK 100 MG CAPSULE: 100 mg | ORAL | Qty: 1

## 2019-06-16 NOTE — Progress Notes (Addendum)
[facility]                                                                                                                                                  Hospitalist Progress Note  Salem Senate, MD  Answering service: [phone] OR [phone] from in house phone  ??      Date of Service:  06/16/2019  NAME:  Michael Lozano  DOB:  [birth year]  MRN:  [id number]  ??  ??  Admission Summary:   Gurinder??is a 85 y.o.????M with PMH of recent COVID, PM, ??DM2, ??And overall??is a very??poor historian.??Information for HPI is obtained via chart and provider.????Per ER note "He has had a gradual decline, very poor p.o. intake to the point that he has not been eating or drinking for several days. ??He also is somnolent and less interactive. ??He has not fallen or had recurrent fevers. ??No vomiting, diarrhea, dyspnea."??He apparently loves with is grandson who is 'never home'. While in ER pt was found to be hypernatremic and hypotensive, ICU was consulted after coffee ground emesis??  ??  Interval history    Patient was admitted to the intensive care unit on 06/02/2019 after presenting to the emergency department with acute kidney injury, hypernatremia. ??Patient had been recently diagnosed with COVID-19 on 05/18/2019, and tested positive again on 06/02/2019. ??Per emergency department records, P "He has had a gradual decline, very poor p.o. intake to the point that he has not been eating or drinking for several days. ??He also is somnolent and less interactive. ??He has not fallen or had recurrent fevers".????Patient was also hypothermic and hypotensive at admission. ??There was concern for sepsis, and he was started on IV fluids, and initial antibiotics IV cefepime and IV vancomycin. ??Mild coffee-ground emesis was noted via nasogastric tube and gastroenterologist was consulted. ??They recommended supportive care with either PPI twice daily or Pepcid twice daily, and felt that EGD was not indicated. ??He has been  followed by infectious disease specialist. ??Patient has a right plantar foot diabetic wound just felt to be chronic by podiatrist who evaluated him during this hospital admission. ??However, cultures on 05/17/2019 from the wound was positive for light E.cloacae??complex, Providencia rettgeri sensitive to cefepime. ????ID recommended Zyvox instead of vancomycin due to the potential contribution to nephrotoxicity. ??IV Flagyl was added for concern for aspiration, and continued until 06/08/2019. ??It was documented by ID on 06/06/2019 that ??organisms in wound culture were probably colonization. urine cultures came back positive for ??Pseudomonas and ecoli. Infectious disease specialist has been following throughout, and commended to continue IV cefepime for 1 week, stopping on the night of 06/09/2019.  ??  Awaiting insurance Authorization for placement at [facility]  ??  06/12/2019: Patient till complaining of not enough food. He has been snacking out of a large figt box of food  from his daughter  ??  06/13/2019: Patient is under the blanket, head included, and refused to take his head out or allow a physical exam today. He states there is not enough junk food in the hospital. He needs more chips options. He has no other complaints. Case management is still working on placement  ??  06/14/2019: Patient worked with PT today. He is still complaining that we need better food in the hospital. He is pleasant today with no further complaints.   ??  06/15/2019: Patient has complaint of mild abdominal pain. RN gave tylenol which resolved complaint. He has a reducible periumbilical hernia on exam, otherwise normal exam. Patient has no further complaints. Case management is working on Engineer, water, but due to [employer] there is no placement yet. ??  Update from CM - patient may DC to [place] early next week  ??  Subjective:   No acute events overnight  Complains of some neuropathic pain of both feet  Off isolation as repeat Covid test is  negative  ??  Assessment & Plan:   ??  1. ??Generalized weakness/deconditioning. ??  Case management has been consulted for rehab placement.  Awaiting placement  ??  2. ??Recent COVID-19 virus infection. ??  Patient off contact and droplet isolation. Has taste changes and is frustrated with it  ??  3. ??Diabetes mellitus and Diabetic neuropathy. ??  Continue gabapentin.  Currently on sliding scale insulin. Daughter sent a huge basket of food which he is still snacking out of. Glucose stabl in the normal ranges long enough, we DCd the finger sticks  ??  4. Acute kidney injury present on admission is now resolved. ??  Creatinine appears to be close to baseline. DCd renal panels  ??  5. Chronic right plantar diabetic wound  Completed antibiotics as per ID recommendations  ??  6. Upper GI bleed  Mild coffee-ground emesis was noted via nasogastric tube and gastroenterologist was consulted. ??They recommended supportive care with either PPI twice daily or Pepcid twice daily, and felt that EGD was not indicated.   ??  Code status: full  DVT prophylaxis: SCDs  ??  Care Plan discussed with: Patient/Family  Anticipated Disposition: SNF/LTC  .  Patient lives with a niece at home  Anticipated Discharge: 24 hours to 48 hours   ??              Hospital Problems  Date Reviewed: 2019/06/19   ??       Codes Class Noted POA   ?? Hypernatremia ICD-10-CM: E87.0  ICD-9-CM: 276.0 ?? 06/02/2019 Unknown   ?? ??   ?? AKI (acute kidney injury) (HCC) ICD-10-CM: N17.9  ICD-9-CM: 584.9 ?? 06/02/2019 Unknown   ?? ??   ?? GI bleed ICD-10-CM: K92.2  ICD-9-CM: 578.9 ?? 06/02/2019 Unknown   ?? ??   ??   ??  ??  ??  Review of Systems:   A comprehensive review of systems was negative except for that written in the HPI.   ??  ??  Vital Signs:    Last 24hrs VS reviewed since prior progress note. Most recent are:  Visit Vitals  BP (!) 110/54 (BP 1 Location: Left upper arm, BP Patient Position: At rest)   Pulse 64   Temp 97.4 ??F (36.3 ??C)   Resp 18   Ht 5\' 10"  (1.778 m)   Wt 91.8 kg (202 lb 6.4  oz)   SpO2 98%   BMI 29.04 kg/m??   ??  ??  No intake or output data in the 24 hours ending 06/17/19 1811   ??  Physical Examination:   ??  I had a face to face encounter with this patient and independently examined them on 06/17/2019 as outlined below:  ??                                                Constitutional:  No acute distress, cooperative, pleasant??   ENT:  Oral mucosa moist, oropharynx benign.  Poor dentition.   Resp:  CTA bilaterally. No wheezing/rhonchi/rales. No accessory muscle use   CV:  Regular rhythm, normal rate, no murmurs, gallops, rubs    GI:  Soft, non distended, non tender. normoactive bowel sounds, no hepatosplenomegaly     Musculoskeletal:  No edema, warm, 2+ pulses throughout.  Right foot plantar ulcer with no signs of infection.    Neurologic:  Moves all extremities.  AAOx3, CN II-XII reviewed                         Psych:  Good insight, Not anxious nor agitated.  ??   ??  Data Review:    Review and/or order of clinical lab test  ??  ??  Labs:   No results for input(s): WBC, HGB, HCT, PLT, HGBEXT, HCTEXT, PLTEXT in the last 72 hours.  No results for input(s): NA, K, CL, CO2, BUN, CREA, GLU, CA, MG, PHOS, URICA in the last 72 hours.  No results for input(s): ALT, AP, TBIL, TBILI, TP, ALB, GLOB, GGT, AML, LPSE in the last 72 hours.  ??  No lab exists for component: SGOT, GPT, AMYP, HLPSE  No results for input(s): INR, PTP, APTT, INREXT in the last 72 hours.   No results for input(s): FE, TIBC, PSAT, FERR in the last 72 hours.   No results found for: FOL, RBCF   No results for input(s): PH, PCO2, PO2 in the last 72 hours.  No results for input(s): CPK, CKNDX, TROIQ in the last 72 hours.  ??  No lab exists for component: CPKMB        Lab Results   Component Value Date/Time   ?? Cholesterol, total 118 08/10/2011 04:30 AM   ?? HDL Cholesterol 31 08/10/2011 04:30 AM   ?? LDL, calculated 73.4 08/10/2011 04:30 AM   ?? Triglyceride 68 08/10/2011 04:30 AM   ?? CHOL/HDL Ratio 3.8 08/10/2011 04:30 AM   ??        Lab  Results   Component Value Date/Time   ?? Glucose (POC) 96 06/13/2019 12:44 PM   ?? Glucose (POC) 130 (H) 06/12/2019 12:22 PM   ?? Glucose (POC) 126 (H) 06/12/2019 06:49 AM   ?? Glucose (POC) 115 (H) 06/12/2019 01:02 AM   ?? Glucose (POC) 75 06/11/2019 09:37 PM   ??        Lab Results   Component Value Date/Time   ?? Color DARK YELLOW 06/02/2019 01:00 PM   ?? Appearance CLOUDY (A) 06/02/2019 01:00 PM   ?? Specific gravity 1.023 06/02/2019 01:00 PM   ?? pH (UA) 5.0 06/02/2019 01:00 PM   ?? Protein Negative 06/02/2019 01:00 PM   ?? Glucose Negative 06/02/2019 01:00 PM   ?? Ketone TRACE (A) 06/02/2019 01:00 PM   ?? Bilirubin NEGATIVE  08/09/2011 11:25 PM   ?? Urobilinogen 0.2 06/02/2019  01:00 PM   ?? Nitrites Positive (A) 06/02/2019 01:00 PM   ?? Leukocyte Esterase SMALL (A) 06/02/2019 01:00 PM   ?? Epithelial cells FEW 06/02/2019 01:00 PM   ?? Bacteria Negative 06/02/2019 01:00 PM   ?? WBC 0-4 06/02/2019 01:00 PM   ?? RBC 0-5 06/02/2019 01:00 PM   ??  ??  ??  Medications Reviewed:   ??         Current Facility-Administered Medications   Medication Dose Route Frequency   ??? ammonium lactate (LAC-HYDRIN) 12 % lotion   Topical DAILY   ??? albumin human 25% (BUMINATE) solution 12.5 g  12.5 g IntraVENous Q8H PRN   ??? gabapentin (NEURONTIN) capsule 300 mg  300 mg Oral BID   ??? pantoprazole (PROTONIX) tablet 40 mg  40 mg Oral BID   ??? cholecalciferol (VITAMIN D3) (1000 Units /25 mcg) tablet 2,000 Units  2,000 Units Oral DAILY   ??? sucralfate (CARAFATE) tablet 1 g  1 g Oral AC&HS   ??? docusate sodium (COLACE) capsule 100 mg  100 mg Oral DAILY   ??? HYDROmorphone (PF) (DILAUDID) injection 0.5 mg  0.5 mg IntraVENous Q4H PRN   ??? albuterol (PROVENTIL HFA, VENTOLIN HFA, PROAIR HFA) inhaler 1 Puff  1 Puff Inhalation Q4H PRN   ??? glucose chewable tablet 16 g  4 Tab Oral PRN   ??? dextrose (D50W) injection syrg 12.5-25 g  25-50 mL IntraVENous PRN   ??? glucagon (GLUCAGEN) injection 1 mg  1 mg IntraMUSCular PRN   ??? acetaminophen (TYLENOL) tablet 650 mg  650 mg Oral Q6H PRN    ?? Or   ??? acetaminophen (TYLENOL) suppository 650 mg  650 mg Rectal Q6H PRN   ??? acetaminophen (TYLENOL) tablet 650 mg  650 mg Oral Q6H   ??? sodium chloride (NS) flush 5-40 mL  5-40 mL IntraVENous Q8H   ??? sodium chloride (NS) flush 5-40 mL  5-40 mL IntraVENous PRN   ??? polyethylene glycol (MIRALAX) packet 17 g  17 g Oral DAILY PRN   ??? promethazine (PHENERGAN) tablet 12.5 mg  12.5 mg Oral Q6H PRN   ?? Or   ??? ondansetron (ZOFRAN) injection 4 mg  4 mg IntraVENous Q6H PRN   ??  ______________________________________________________________________  EXPECTED LENGTH OF STAY: 5d 9h  ACTUAL LENGTH OF STAY:          14  ??                                                                                                         Joanna Hews, MD   ??

## 2019-06-16 NOTE — Progress Notes (Signed)
Progress Notes by Salem SenateAl-Dabbagh, Adrieanna Boteler M, MD at 06/16/19 1013                Author: Salem SenateAl-Dabbagh, Gonzalo Waymire M, MD  Service: Internal Medicine  Author Type: Physician       Filed: 06/17/19 1825  Date of Service: 06/16/19 1013  Status: Addendum          Editor: Salem SenateAl-Dabbagh, [place] Flett M, MD (Physician)          Related Notes: Original Note by Salem SenateAl-Dabbagh, Jaylie Neaves M, MD (Physician) filed at 06/17/19 0257                                  [facility]                                                                                                                                                     Hospitalist Progress Note   Salem SenateHaydar M Al-Dabbagh, MD   Answering service: [phone] OR [phone] from in house phone   ??          Date of Service:  06/16/2019   NAME:  Michael Lozano   DOB:  18-Apr-1934   MRN:  [id number]   ??   ??   Admission Summary:      Carmino??is a 85 y.o.????M with PMH of recent COVID, PM, ??DM2, ??And overall??is a very??poor historian.??Information for HPI is obtained via chart and provider.????Per  ER note "He has had a gradual decline, very poor p.o. intake to the point that he has not been eating or drinking for several days. ??He also is somnolent and less interactive. ??He has not fallen or had recurrent fevers. ??No vomiting, diarrhea,  dyspnea."??He apparently loves with is grandson who is 'never home'. While in ER pt was found to be hypernatremic and hypotensive, ICU was consulted after coffee ground emesis??   ??   Interval history       Patient was admitted to the intensive care unit on 06/02/2019 after presenting to the emergency department with acute kidney injury, hypernatremia. ??Patient had been recently diagnosed  with COVID-19 on 05/18/2019, and tested positive again on 06/02/2019. ??Per emergency department records, P "He has had a gradual decline, very poor p.o. intake to the point that he has not been eating or drinking for several days. ??He also is somnolent  and less interactive. ??He  has not fallen or had recurrent fevers".????Patient was also hypothermic and hypotensive at admission. ??There was concern for sepsis, and he  was started on IV fluids, and initial antibiotics IV cefepime and IV vancomycin. ??Mild coffee-ground emesis was noted via nasogastric tube and gastroenterologist was consulted. ??They recommended supportive care with either PPI twice daily or Pepcid  twice daily, and  felt that EGD was not indicated. ??He has been followed by infectious disease specialist. ??Patient has a right plantar foot diabetic wound just felt to be chronic by podiatrist who evaluated him during this hospital admission. ??However,  cultures on 05/17/2019 from the wound was positive for light E.cloacae??complex, Providencia rettgeri sensitive to cefepime. ????ID recommended Zyvox instead of vancomycin due to the potential contribution to nephrotoxicity. ??IV Flagyl was added  for concern for aspiration, and continued until 06/08/2019. ??It was documented by ID on 06/06/2019 that ??organisms in wound culture were probably colonization. urine cultures came back positive for ??Pseudomonas and ecoli. Infectious disease specialist  has been following throughout, and commended to continue IV cefepime for 1 week, stopping on the night of 06/09/2019.   ??   Awaiting insurance Authorization for placement at [facility]   ??   06/12/2019: Patient till complaining of not enough food. He has been snacking out of a large figt box of food from his daughter   ??   06/13/2019: Patient is under the blanket, head included, and refused to take his head out or allow a physical exam today. He states there is not enough junk food in the hospital. He needs more chips options. He has no other complaints. Case management  is still working on placement   ??   06/14/2019: Patient worked with PT today. He is still complaining that we need better food in the hospital. He is pleasant today with no further complaints.    ??   06/15/2019: Patient has complaint of  mild abdominal pain. RN gave tylenol which resolved complaint. He has a reducible periumbilical hernia on exam, otherwise normal exam. Patient has no further complaints. Case management is working on [facility], water, but due  to [employer] there is no placement yet. ??   Update from CM - patient may DC to [place] early next week   ??   Subjective:      No acute events overnight   Complains of some neuropathic pain of both feet   Off isolation as repeat Covid test is negative   ??   Assessment & Plan:      ??   1. ??Generalized weakness/deconditioning. ??   Case management has been consulted for rehab placement.  Awaiting placement   ??   2. ??Recent COVID-19 virus infection. ??   Patient off contact and droplet isolation. Has taste changes and is frustrated with it   ??   3. ??Diabetes mellitus and Diabetic neuropathy. ??   Continue gabapentin.   Currently on sliding scale insulin. Daughter sent a huge basket of food which he is still snacking out of. Glucose stabl in the normal ranges long enough, we DCd the finger sticks   ??   4. Acute kidney injury present on admission is now resolved. ??   Creatinine appears to be close to baseline. DCd renal panels   ??   5. Chronic right plantar diabetic wound   Completed antibiotics as per ID recommendations   ??   6. Upper GI bleed   Mild coffee-ground emesis was noted via nasogastric tube and gastroenterologist was consulted. ??They recommended supportive care with either PPI twice daily or Pepcid twice daily, and felt that EGD was not indicated.    ??   Code status: full   DVT prophylaxis: SCDs   ??   Care Plan discussed with: Patient/Family   Anticipated Disposition: SNF/LTC  .  Patient lives with a niece at home   Anticipated Discharge:  24 hours to 48 hours      ??                           Hospital Problems   Date Reviewed: 09-Jun-2019      ??           Codes  Class  Noted  POA      ??  Hypernatremia  ICD-10-CM: E87.0   ICD-9-CM: 276.0  ??  06/02/2019  Unknown      ??  ??      ??  AKI (acute  kidney injury) (HCC)  ICD-10-CM: N17.9   ICD-9-CM: 584.9  ??  06/02/2019  Unknown      ??  ??      ??  GI bleed  ICD-10-CM: K92.2   ICD-9-CM: 578.9  ??  06/02/2019  Unknown      ??  ??      ??      ??   ??   ??   Review of Systems:      A comprehensive review of systems was negative except for that written in the HPI.    ??   ??   Vital Signs:       Last 24hrs VS reviewed since prior progress note. Most recent are:   Visit Vitals   BP  (!) 110/54 (BP 1 Location: Left upper arm, BP Patient Position: At rest)      Pulse  64      Temp  97.4 ??F (36.3 ??C)      Resp  18      Ht  5\' 10"  (1.778 m)      Wt  91.8 kg (202 lb 6.4 oz)      SpO2  98%      BMI  29.04 kg/m??      ??   ??   No intake or output data in the 24 hours ending 06/17/19 1811    ??   Physical Examination:      ??   I had a face to face encounter with this patient and independently examined them on 06/17/2019 as outlined below:   ??                                                  Constitutional:   No acute distress, cooperative, pleasant??      ENT:   Oral mucosa moist, oropharynx benign.  Poor dentition.      Resp:   CTA bilaterally. No wheezing/rhonchi/rales. No accessory muscle use      CV:   Regular rhythm, normal rate, no murmurs, gallops, rubs       GI:   Soft, non distended, non tender. normoactive bowel sounds, no hepatosplenomegaly        Musculoskeletal:   No edema, warm, 2+ pulses throughout.  Right foot plantar ulcer with no signs of infection.       Neurologic:   Moves all extremities.  AAOx3, CN II-XII reviewed                            Psych:  Good insight, Not anxious nor agitated.   ??      ??   Data Review:  Review and/or order of clinical lab test   ??   ??   Labs:      No results for input(s): WBC, HGB, HCT, PLT, HGBEXT, HCTEXT, PLTEXT in the last 72 hours.   No results for input(s): NA, K, CL, CO2, BUN, CREA, GLU, CA, MG, PHOS, URICA in the last 72 hours.   No results for input(s): ALT, AP, TBIL, TBILI, TP, ALB, GLOB, GGT, AML, LPSE in the last 72 hours.    ??   No lab exists for component: SGOT, GPT, AMYP, HLPSE   No results for input(s): INR, PTP, APTT, INREXT in the last 72 hours.    No results for input(s): FE, TIBC, PSAT, FERR in the last 72 hours.    No results found for: FOL, RBCF    No results for input(s): PH, PCO2, PO2 in the last 72 hours.   No results for input(s): CPK, CKNDX, TROIQ in the last 72 hours.   ??   No lab exists for component: CPKMB               Lab Results      Component  Value  Date/Time      ??  Cholesterol, total  118  08/10/2011 04:30 AM      ??  HDL Cholesterol  31  08/10/2011 04:30 AM      ??  LDL, calculated  73.4  08/10/2011 04:30 AM      ??  Triglyceride  68  08/10/2011 04:30 AM      ??  CHOL/HDL Ratio  3.8  08/10/2011 04:30 AM      ??               Lab Results      Component  Value  Date/Time      ??  Glucose (POC)  96  06/13/2019 12:44 PM      ??  Glucose (POC)  130 (H)  06/12/2019 12:22 PM      ??  Glucose (POC)  126 (H)  06/12/2019 06:49 AM      ??  Glucose (POC)  115 (H)  06/12/2019 01:02 AM      ??  Glucose (POC)  75  06/11/2019 09:37 PM      ??               Lab Results      Component  Value  Date/Time      ??  Color  DARK YELLOW  06/02/2019 01:00 PM      ??  Appearance  CLOUDY (A)  06/02/2019 01:00 PM      ??  Specific gravity  1.023  06/02/2019 01:00 PM      ??  pH (UA)  5.0  06/02/2019 01:00 PM      ??  Protein  Negative  06/02/2019 01:00 PM      ??  Glucose  Negative  06/02/2019 01:00 PM      ??  Ketone  TRACE (A)  06/02/2019 01:00 PM      ??  Bilirubin  NEGATIVE   08/09/2011 11:25 PM      ??  Urobilinogen  0.2  06/02/2019 01:00 PM      ??  Nitrites  Positive (A)  06/02/2019 01:00 PM      ??  Leukocyte Esterase  SMALL (A)  06/02/2019 01:00 PM      ??  Epithelial cells  FEW  06/02/2019 01:00 PM      ??  Bacteria  Negative  06/02/2019 01:00 PM      ??  WBC  0-4  06/02/2019 01:00 PM      ??  RBC  0-5  06/02/2019 01:00 PM      ??   ??   ??   Medications Reviewed:      ??                 Current Facility-Administered Medications      Medication  Dose  Route   Frequency      ?  ammonium lactate (LAC-HYDRIN) 12 % lotion     Topical  DAILY      ?  albumin human 25% (BUMINATE) solution 12.5 g   12.5 g  IntraVENous  Q8H PRN      ?  gabapentin (NEURONTIN) capsule 300 mg   300 mg  Oral  BID      ?  pantoprazole (PROTONIX) tablet 40 mg   40 mg  Oral  BID      ?  cholecalciferol (VITAMIN D3) (1000 Units /25 mcg) tablet 2,000 Units   2,000 Units  Oral  DAILY      ?  sucralfate (CARAFATE) tablet 1 g   1 g  Oral  AC&HS      ?  docusate sodium (COLACE) capsule 100 mg   100 mg  Oral  DAILY      ?  HYDROmorphone (PF) (DILAUDID) injection 0.5 mg   0.5 mg  IntraVENous  Q4H PRN      ?  albuterol (PROVENTIL HFA, VENTOLIN HFA, PROAIR HFA) inhaler 1 Puff   1 Puff  Inhalation  Q4H PRN      ?  glucose chewable tablet 16 g   4 Tab  Oral  PRN      ?  dextrose (D50W) injection syrg 12.5-25 g   25-50 mL  IntraVENous  PRN      ?  glucagon (GLUCAGEN) injection 1 mg   1 mg  IntraMUSCular  PRN      ?  acetaminophen (TYLENOL) tablet 650 mg   650 mg  Oral  Q6H PRN      ??  Or      ?  acetaminophen (TYLENOL) suppository 650 mg   650 mg  Rectal  Q6H PRN      ?  acetaminophen (TYLENOL) tablet 650 mg   650 mg  Oral  Q6H      ?  sodium chloride (NS) flush 5-40 mL   5-40 mL  IntraVENous  Q8H      ?  sodium chloride (NS) flush 5-40 mL   5-40 mL  IntraVENous  PRN      ?  polyethylene glycol (MIRALAX) packet 17 g   17 g  Oral  DAILY PRN      ?  promethazine (PHENERGAN) tablet 12.5 mg   12.5 mg  Oral  Q6H PRN      ??  Or      ?  ondansetron (ZOFRAN) injection 4 mg   4 mg  IntraVENous  Q6H PRN      ??   ______________________________________________________________________   EXPECTED LENGTH OF STAY: 5d 9h   ACTUAL LENGTH OF STAY:          14   ??  Salem Senate, MD    ??

## 2019-06-17 MED FILL — ACETAMINOPHEN 325 MG TABLET: 325 mg | ORAL | Qty: 2

## 2019-06-17 MED FILL — PANTOPRAZOLE 40 MG TAB, DELAYED RELEASE: 40 mg | ORAL | Qty: 1

## 2019-06-17 MED FILL — SUCRALFATE 1 GRAM TAB: 1 gram | ORAL | Qty: 1

## 2019-06-17 MED FILL — DOK 100 MG CAPSULE: 100 mg | ORAL | Qty: 1

## 2019-06-17 MED FILL — CHOLECALCIFEROL (VITAMIN D3) 1,000 UNIT (25 MCG) TAB: ORAL | Qty: 2

## 2019-06-17 MED FILL — GABAPENTIN 300 MG CAP: 300 mg | ORAL | Qty: 1

## 2019-06-17 NOTE — Progress Notes (Signed)
Problem: Falls - Risk of  Goal: *Absence of Falls  Description: Document Schmid Fall Risk and appropriate interventions in the flowsheet.  Outcome: Not Progressing Towards Goal  Note: Fall Risk Interventions:  Mobility Interventions: Bed/chair exit alarm, Communicate number of staff needed for ambulation/transfer, Patient to call before getting OOB    Mentation Interventions: Bed/chair exit alarm, Door open when patient unattended, Toileting rounds, Update white board    Medication Interventions: Evaluate medications/consider consulting pharmacy, Patient to call before getting OOB, Teach patient to arise slowly    Elimination Interventions: Call light in reach, Patient to call for help with toileting needs, Toileting schedule/hourly rounds    History of Falls Interventions: Door open when patient unattended

## 2019-06-17 NOTE — Progress Notes (Signed)
[facility]                                                                                          Hospitalist Progress Note  Salem Senate, MD  Answering service: [phone] OR [phone] from in house phone        Date of Service:  06/17/2019  NAME:  Michael Lozano  DOB:  04/23/1934  MRN:  [id number]      Admission Summary:   [place]??is a 85 y.o.????M with PMH of recent COVID, PM, ??DM2,  And overall is a very poor historian. Information for HPI is obtained via chart and provider.  Per ER note "He has had a gradual decline, very poor p.o. intake to the point that he has not been eating or drinking for several days. ??He also is somnolent and less interactive. ??He has not fallen or had recurrent fevers. ??No vomiting, diarrhea, dyspnea." He apparently loves with is grandson who is 'never home'. While in ER pt was found to be hypernatremic and hypotensive, ICU was consulted after coffee ground emesis     Interval history    Patient was admitted to the intensive care unit on 06/02/2019 after presenting to the emergency department with acute kidney injury, hypernatremia.  Patient had been recently diagnosed with COVID-19 on 05/18/2019, and tested positive again on 06/02/2019.  Per emergency department records, P "He has had a gradual decline, very poor p.o. intake to the point that he has not been eating or drinking for several days. ??He also is somnolent and less interactive. ??He has not fallen or had recurrent fevers".  Patient was also hypothermic and hypotensive at admission.  There was concern for sepsis, and he was started on IV fluids, and initial antibiotics IV cefepime and IV vancomycin.  Mild coffee-ground emesis was noted via nasogastric tube and gastroenterologist was consulted.  They recommended supportive care with either PPI twice daily or Pepcid twice daily, and felt that EGD was not indicated.  He has been followed by infectious disease specialist.  Patient has a right  plantar foot diabetic wound just felt to be chronic by podiatrist who evaluated him during this hospital admission.  However, cultures on 05/17/2019 from the wound was positive for light E.cloacae??complex, Providencia rettgeri sensitive to cefepime.   ID recommended Zyvox instead of vancomycin due to the potential contribution to nephrotoxicity.  IV Flagyl was added for concern for aspiration, and continued until 06/08/2019.  It was documented by ID on 06/06/2019 that  organisms in wound culture were probably colonization. urine cultures came back positive for  Pseudomonas and ecoli. Infectious disease specialist has been following throughout, and commended to continue IV cefepime for 1 week, stopping on the night of 06/09/2019.    Awaiting insurance Authorization for placement at [facility]  ??  06/12/2019: Patient till complaining of not enough food. He has been snacking out of a large figt box of food from his daughter  ??  06/13/2019: Patient is under the blanket, head included, and refused to take his head out or allow a physical exam today. He states there is not  enough junk food in the hospital. He needs more chips options. He has no other complaints. Case management is still working on placement  ??  06/14/2019: Patient worked with PT today. He is still complaining that we need better food in the hospital. He is pleasant today with no further complaints.   ??  06/15/2019: Patient has complaint of mild abdominal pain. RN gave tylenol which resolved complaint. He has a reducible periumbilical hernia on exam, otherwise normal exam. Patient has no further complaints. Case management is working on [facility], water, but due to [employer] there is no placement yet.    Update from CM - patient may DC to [place] early next week    Subjective:   No acute events overnight  Complains of some neuropathic pain of both feet  Off isolation as repeat Covid test is negative    Assessment & Plan:     1.  Generalized  weakness/deconditioning.    Case management has been consulted for rehab placement.  Awaiting placement  ??  2.  Recent COVID-19 virus infection.    Patient off contact and droplet isolation. Has taste changes and is frustrated with it  ??  3.  Diabetes mellitus and Diabetic neuropathy.    Continue gabapentin.  Currently on sliding scale insulin. Daughter sent a huge basket of food which he is still snacking out of. Glucose stabl in the normal ranges long enough, we DCd the finger sticks    4. Acute kidney injury present on admission is now resolved.    Creatinine appears to be close to baseline. DCd renal panels  ??  5. Chronic right plantar diabetic wound  Completed antibiotics as per ID recommendations    6. Upper GI bleed  Mild coffee-ground emesis was noted via nasogastric tube and gastroenterologist was consulted.  They recommended supportive care with either PPI twice daily or Pepcid twice daily, and felt that EGD was not indicated.     Code status: full  DVT prophylaxis: SCDs    Care Plan discussed with: Patient/Family  Anticipated Disposition: SNF/LTC  .  Patient lives with a niece at home  Anticipated Discharge: 24 hours to 48 hours     Hospital Problems  Date Reviewed: 06/27/19          Codes Class Noted POA    Hypernatremia ICD-10-CM: E87.0  ICD-9-CM: 276.0  06/02/2019 Unknown        AKI (acute kidney injury) [facility]) ICD-10-CM: N17.9  ICD-9-CM: 584.9  06/02/2019 Unknown        GI bleed ICD-10-CM: K92.2  ICD-9-CM: 578.9  06/02/2019 Unknown                Review of Systems:   A comprehensive review of systems was negative except for that written in the HPI.       Vital Signs:    Last 24hrs VS reviewed since prior progress note. Most recent are:  Visit Vitals  BP (!) 110/54 (BP 1 Location: Left upper arm, BP Patient Position: At rest)   Pulse 64   Temp 97.4 ??F (36.3 ??C)   Resp 18   Ht 5\' 10"  (1.778 m)   Wt 91.8 kg (202 lb 6.4 oz)   SpO2 98%   BMI 29.04 kg/m??       No intake or output data in the 24 hours ending  06/17/19 1811     Physical Examination:     I had a face to face encounter with this patient and independently examined  them on 06/17/2019 as outlined below:          Constitutional:  No acute distress, cooperative, pleasant    ENT:  Oral mucosa moist, oropharynx benign.  Poor dentition.   Resp:  CTA bilaterally. No wheezing/rhonchi/rales. No accessory muscle use   CV:  Regular rhythm, normal rate, no murmurs, gallops, rubs    GI:  Soft, non distended, non tender. normoactive bowel sounds, no hepatosplenomegaly     Musculoskeletal:  No edema, warm, 2+ pulses throughout.  Right foot plantar ulcer with no signs of infection.    Neurologic:  Moves all extremities.  AAOx3, CN II-XII reviewed     Psych:  Good insight, Not anxious nor agitated.       Data Review:    Review and/or order of clinical lab test      Labs:   No results for input(s): WBC, HGB, HCT, PLT, HGBEXT, HCTEXT, PLTEXT in the last 72 hours.  No results for input(s): NA, K, CL, CO2, BUN, CREA, GLU, CA, MG, PHOS, URICA in the last 72 hours.  No results for input(s): ALT, AP, TBIL, TBILI, TP, ALB, GLOB, GGT, AML, LPSE in the last 72 hours.    No lab exists for component: SGOT, GPT, AMYP, HLPSE  No results for input(s): INR, PTP, APTT, INREXT in the last 72 hours.   No results for input(s): FE, TIBC, PSAT, FERR in the last 72 hours.   No results found for: FOL, RBCF   No results for input(s): PH, PCO2, PO2 in the last 72 hours.  No results for input(s): CPK, CKNDX, TROIQ in the last 72 hours.    No lab exists for component: CPKMB  Lab Results   Component Value Date/Time    Cholesterol, total 118 08/10/2011 04:30 AM    HDL Cholesterol 31 08/10/2011 04:30 AM    LDL, calculated 73.4 08/10/2011 04:30 AM    Triglyceride 68 08/10/2011 04:30 AM    CHOL/HDL Ratio 3.8 08/10/2011 04:30 AM     Lab Results   Component Value Date/Time    Glucose (POC) 96 06/13/2019 12:44 PM    Glucose (POC) 130 (H) 06/12/2019 12:22 PM    Glucose (POC) 126 (H) 06/12/2019 06:49 AM    Glucose  (POC) 115 (H) 06/12/2019 01:02 AM    Glucose (POC) 75 06/11/2019 09:37 PM     Lab Results   Component Value Date/Time    Color DARK YELLOW 06/02/2019 01:00 PM    Appearance CLOUDY (A) 06/02/2019 01:00 PM    Specific gravity 1.023 06/02/2019 01:00 PM    pH (UA) 5.0 06/02/2019 01:00 PM    Protein Negative 06/02/2019 01:00 PM    Glucose Negative 06/02/2019 01:00 PM    Ketone TRACE (A) 06/02/2019 01:00 PM    Bilirubin NEGATIVE  08/09/2011 11:25 PM    Urobilinogen 0.2 06/02/2019 01:00 PM    Nitrites Positive (A) 06/02/2019 01:00 PM    Leukocyte Esterase SMALL (A) 06/02/2019 01:00 PM    Epithelial cells FEW 06/02/2019 01:00 PM    Bacteria Negative 06/02/2019 01:00 PM    WBC 0-4 06/02/2019 01:00 PM    RBC 0-5 06/02/2019 01:00 PM         Medications Reviewed:     Current Facility-Administered Medications   Medication Dose Route Frequency   ??? ammonium lactate (LAC-HYDRIN) 12 % lotion   Topical DAILY   ??? albumin human 25% (BUMINATE) solution 12.5 g  12.5 g IntraVENous Q8H PRN   ??? gabapentin (NEURONTIN) capsule  300 mg  300 mg Oral BID   ??? pantoprazole (PROTONIX) tablet 40 mg  40 mg Oral BID   ??? cholecalciferol (VITAMIN D3) (1000 Units /25 mcg) tablet 2,000 Units  2,000 Units Oral DAILY   ??? sucralfate (CARAFATE) tablet 1 g  1 g Oral AC&HS   ??? docusate sodium (COLACE) capsule 100 mg  100 mg Oral DAILY   ??? HYDROmorphone (PF) (DILAUDID) injection 0.5 mg  0.5 mg IntraVENous Q4H PRN   ??? albuterol (PROVENTIL HFA, VENTOLIN HFA, PROAIR HFA) inhaler 1 Puff  1 Puff Inhalation Q4H PRN   ??? glucose chewable tablet 16 g  4 Tab Oral PRN   ??? dextrose (D50W) injection syrg 12.5-25 g  25-50 mL IntraVENous PRN   ??? glucagon (GLUCAGEN) injection 1 mg  1 mg IntraMUSCular PRN   ??? acetaminophen (TYLENOL) tablet 650 mg  650 mg Oral Q6H PRN    Or   ??? acetaminophen (TYLENOL) suppository 650 mg  650 mg Rectal Q6H PRN   ??? acetaminophen (TYLENOL) tablet 650 mg  650 mg Oral Q6H   ??? sodium chloride (NS) flush 5-40 mL  5-40 mL IntraVENous Q8H   ??? sodium  chloride (NS) flush 5-40 mL  5-40 mL IntraVENous PRN   ??? polyethylene glycol (MIRALAX) packet 17 g  17 g Oral DAILY PRN   ??? promethazine (PHENERGAN) tablet 12.5 mg  12.5 mg Oral Q6H PRN    Or   ??? ondansetron (ZOFRAN) injection 4 mg  4 mg IntraVENous Q6H PRN     ______________________________________________________________________  EXPECTED LENGTH OF STAY: 5d 9h  ACTUAL LENGTH OF STAY:          15                 Dejanique Ruehl Denyse Dago, MD

## 2019-06-17 NOTE — Progress Notes (Signed)
Problem: Falls - Risk of  Goal: *Absence of Falls  Description: Document Michael Lozano Fall Risk and appropriate interventions in the flowsheet.  Outcome: Not Progressing Towards Goal  Note: Fall Risk Interventions:  Mobility Interventions: Bed/chair exit alarm, Communicate number of staff needed for ambulation/transfer, Patient to call before getting OOB    Mentation Interventions: Bed/chair exit alarm, Door open when patient unattended, Toileting rounds, Update white board    Medication Interventions: Evaluate medications/consider consulting pharmacy, Patient to call before getting OOB, Teach patient to arise slowly    Elimination Interventions: Call light in reach, Patient to call for help with toileting needs, Toileting schedule/hourly rounds    History of Falls Interventions: Door open when patient unattended

## 2019-06-17 NOTE — Progress Notes (Signed)
Progress Notes by Salem Senate, MD at 06/17/19 9038                Author: Salem Senate, MD  Service: Internal Medicine  Author Type: Physician       Filed: 06/17/19 1824  Date of Service: 06/17/19 0257  Status: Signed          Editor: Salem Senate, MD (Physician)                    [facility]                                                                                              Hospitalist Progress Note   Salem Senate, MD   Answering service: [phone] OR [phone] from in house phone            Date of Service:  06/17/2019   NAME:  Michael Lozano   DOB:  [birth year]   MRN:  [id number]           Admission Summary:        Michael Lozano??is a 85 y.o.????M with PMH of recent COVID, PM, ??DM2,  And overall is a very poor historian. Information for HPI is obtained via chart and provider.  Per ER note "He  has had a gradual decline, very poor p.o. intake to the point that he has not been eating or drinking for several days. ??He also is somnolent and less interactive. ??He has not fallen or had recurrent fevers. ??No vomiting, diarrhea, dyspnea." He  apparently loves with is grandson who is 'never home'. While in ER pt was found to be hypernatremic and hypotensive, ICU was consulted after coffee ground emesis       Interval history         Patient was admitted to the intensive care unit on 06/02/2019 after presenting to the emergency department with acute kidney injury, hypernatremia.  Patient had been recently diagnosed with  COVID-19 on 05/18/2019, and tested positive again on 06/02/2019.  Per emergency department records, P "He has had a gradual decline, very poor p.o. intake to the point that he has not been eating or drinking for several days. ??He also is somnolent and  less interactive. ??He has not fallen or had recurrent fevers".  Patient was also hypothermic and hypotensive at admission.  There was concern for sepsis, and he was started  on IV fluids, and  initial antibiotics IV cefepime and IV vancomycin.  Mild coffee-ground emesis was noted via nasogastric tube and gastroenterologist was consulted.  They recommended supportive care with either PPI twice daily or Pepcid twice daily,  and felt that EGD was not indicated.  He has been followed by infectious disease specialist.  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  However, cultures  on 05/17/2019 from the wound was positive for light E.cloacae??complex, Providencia rettgeri sensitive to cefepime.   ID recommended Zyvox instead of vancomycin due to the potential contribution to nephrotoxicity.  IV Flagyl was added for concern for  aspiration, and continued until 06/08/2019.  It was documented by ID on 06/06/2019 that  organisms in wound culture were probably colonization. urine cultures came back positive for  Pseudomonas and ecoli. Infectious disease specialist has been following  throughout, and commended to continue IV cefepime for 1 week, stopping on the night of 06/09/2019.      Awaiting insurance Authorization for placement at [facility]   ??   06/12/2019: Patient till complaining of not enough food. He has been snacking out of a large figt box of food from his daughter   ??   06/13/2019: Patient is under the blanket, head included, and refused to take his head out or allow a physical exam today. He states there is not enough junk food in the hospital. He needs more chips options. He has no other complaints. Case management  is still working on placement   ??   06/14/2019: Patient worked with PT today. He is still complaining that we need better food in the hospital. He is pleasant today with no further complaints.    ??   06/15/2019: Patient has complaint of mild abdominal pain. RN gave tylenol which resolved complaint. He has a reducible periumbilical hernia on exam, otherwise normal exam. Patient has no further complaints.  Case management is working on Engineer, water, but due  to [employer] there is no placement yet.     Update from CM - patient may DC to [place] early next week      Subjective:     No acute events overnight   Complains of some neuropathic pain of both feet   Off isolation as repeat Covid test is negative        Assessment & Plan:          1.  Generalized weakness/deconditioning.     Case management has been consulted for rehab placement.  Awaiting placement   ??   2.  Recent COVID-19 virus infection.     Patient off contact and droplet isolation. Has taste changes and is frustrated with it   ??   3.  Diabetes mellitus and Diabetic neuropathy.     Continue gabapentin.   Currently on sliding scale insulin. Daughter sent a huge basket of food which he is still snacking out of. Glucose stabl in the normal ranges long enough, we DCd the finger sticks      4. Acute kidney injury present on admission is now resolved.     Creatinine appears to be close to baseline. DCd renal panels   ??   5. Chronic right plantar diabetic wound   Completed antibiotics as per ID recommendations      6. Upper GI bleed   Mild coffee-ground emesis was noted via nasogastric tube and gastroenterologist was consulted.  They recommended supportive care with either PPI twice daily or Pepcid twice daily, and felt that EGD was not indicated.       Code status: full   DVT prophylaxis: SCDs      Care Plan discussed with: Patient/Family   Anticipated Disposition: SNF/LTC  .  Patient lives with a niece at home   Anticipated Discharge: 24 hours to 48 hours           Hospital Problems   Date Reviewed:  06/05/2019                         Codes  Class  Noted  POA              Hypernatremia  ICD-10-CM: E87.0   ICD-9-CM: 276.0    06/02/2019  Unknown                        AKI (acute kidney injury) ([place])  ICD-10-CM: N17.9   ICD-9-CM: 584.9    06/02/2019  Unknown                        GI bleed  ICD-10-CM: K92.2   ICD-9-CM: 578.9    06/02/2019  Unknown                                Review of Systems:     A comprehensive review of systems was negative except for that written in the HPI.            Vital Signs:      Last 24hrs VS reviewed since prior progress note. Most recent are:   Visit Vitals      BP  (!) 110/54 (BP 1 Location: Left upper arm, BP Patient Position: At rest)     Pulse  64     Temp  97.4 ??F (36.3 ??C)     Resp  18     Ht  5\' 10"  (1.778 m)     Wt  91.8 kg (202 lb 6.4 oz)     SpO2  98%        BMI  29.04 kg/m??           No intake or output data in the 24 hours ending 06/17/19 1811         Physical Examination:        I had a face to face encounter with this patient and independently examined them on 06/17/2019 as outlined below:                Constitutional:   No acute distress, cooperative, pleasant      ENT:   Oral mucosa moist, oropharynx benign.  Poor dentition.     Resp:   CTA bilaterally. No wheezing/rhonchi/rales. No accessory muscle use        CV:   Regular rhythm, normal rate, no murmurs, gallops, rubs         GI:   Soft, non distended, non tender. normoactive bowel sounds, no hepatosplenomegaly          Musculoskeletal:   No edema, warm, 2+ pulses throughout.  Right foot plantar ulcer with no signs of infection.         Neurologic:   Moves all extremities.  AAOx3, CN II-XII reviewed       Psych:  Good insight, Not anxious nor agitated.               Data Review:      Review and/or order of clinical lab test           Labs:     No results for input(s): WBC, HGB, HCT, PLT, HGBEXT, HCTEXT, PLTEXT in the last 72 hours.   No results for input(s): NA, K, CL, CO2, BUN, CREA, GLU, CA, MG, PHOS, URICA in the last 72 hours.   No results for input(s): ALT, AP, TBIL, TBILI, TP, ALB, GLOB, GGT, AML, LPSE in the last 72 hours.      No lab exists for component:  SGOT, GPT, AMYP, HLPSE   No results for input(s): INR, PTP, APTT, INREXT in the last 72 hours.    No results for input(s): FE, TIBC, PSAT, FERR in the last 72 hours.    No results found for: FOL, RBCF    No results  for input(s): PH, PCO2, PO2 in the last 72 hours.   No results for input(s): CPK, CKNDX, TROIQ in the last 72 hours.      No lab exists for component: CPKMB     Lab Results         Component  Value  Date/Time            Cholesterol, total  118  08/10/2011 04:30 AM       HDL Cholesterol  31  08/10/2011 04:30 AM       LDL, calculated  73.4  08/10/2011 04:30 AM       Triglyceride  68  08/10/2011 04:30 AM            CHOL/HDL Ratio  3.8  08/10/2011 04:30 AM          Lab Results         Component  Value  Date/Time            Glucose (POC)  96  06/13/2019 12:44 PM       Glucose (POC)  130 (H)  06/12/2019 12:22 PM       Glucose (POC)  126 (H)  06/12/2019 06:49 AM       Glucose (POC)  115 (H)  06/12/2019 01:02 AM            Glucose (POC)  75  06/11/2019 09:37 PM          Lab Results         Component  Value  Date/Time            Color  DARK YELLOW  06/02/2019 01:00 PM       Appearance  CLOUDY (A)  06/02/2019 01:00 PM       Specific gravity  1.023  06/02/2019 01:00 PM       pH (UA)  5.0  06/02/2019 01:00 PM       Protein  Negative  06/02/2019 01:00 PM       Glucose  Negative  06/02/2019 01:00 PM       Ketone  TRACE (A)  06/02/2019 01:00 PM       Bilirubin  NEGATIVE   08/09/2011 11:25 PM       Urobilinogen  0.2  06/02/2019 01:00 PM       Nitrites  Positive (A)  06/02/2019 01:00 PM       Leukocyte Esterase  SMALL (A)  06/02/2019 01:00 PM       Epithelial cells  FEW  06/02/2019 01:00 PM       Bacteria  Negative  06/02/2019 01:00 PM       WBC  0-4  06/02/2019 01:00 PM            RBC  0-5  06/02/2019 01:00 PM                Medications Reviewed:          Current Facility-Administered Medications          Medication  Dose  Route  Frequency           ?  ammonium lactate (LAC-HYDRIN) 12 % lotion     Topical  DAILY     ?  albumin human 25% (BUMINATE) solution 12.5 g   12.5 g  IntraVENous  Q8H PRN     ?  gabapentin (NEURONTIN) capsule 300 mg   300 mg  Oral  BID     ?  pantoprazole (PROTONIX) tablet 40 mg   40 mg  Oral  BID     ?   cholecalciferol (VITAMIN D3) (1000 Units /25 mcg) tablet 2,000 Units   2,000 Units  Oral  DAILY     ?  sucralfate (CARAFATE) tablet 1 g   1 g  Oral  AC&HS     ?  docusate sodium (COLACE) capsule 100 mg   100 mg  Oral  DAILY     ?  HYDROmorphone (PF) (DILAUDID) injection 0.5 mg   0.5 mg  IntraVENous  Q4H PRN     ?  albuterol (PROVENTIL HFA, VENTOLIN HFA, PROAIR HFA) inhaler 1 Puff   1 Puff  Inhalation  Q4H PRN     ?  glucose chewable tablet 16 g   4 Tab  Oral  PRN     ?  dextrose (D50W) injection syrg 12.5-25 g   25-50 mL  IntraVENous  PRN     ?  glucagon (GLUCAGEN) injection 1 mg   1 mg  IntraMUSCular  PRN     ?  acetaminophen (TYLENOL) tablet 650 mg   650 mg  Oral  Q6H PRN          Or           ?  acetaminophen (TYLENOL) suppository 650 mg   650 mg  Rectal  Q6H PRN     ?  acetaminophen (TYLENOL) tablet 650 mg   650 mg  Oral  Q6H     ?  sodium chloride (NS) flush 5-40 mL   5-40 mL  IntraVENous  Q8H     ?  sodium chloride (NS) flush 5-40 mL   5-40 mL  IntraVENous  PRN     ?  polyethylene glycol (MIRALAX) packet 17 g   17 g  Oral  DAILY PRN     ?  promethazine (PHENERGAN) tablet 12.5 mg   12.5 mg  Oral  Q6H PRN          Or           ?  ondansetron (ZOFRAN) injection 4 mg   4 mg  IntraVENous  Q6H PRN        ______________________________________________________________________   EXPECTED LENGTH OF STAY: 5d 9h   ACTUAL LENGTH OF STAY:          15                    Corvin Sorbo Denyse DagoM Al-Dabbagh, MD

## 2019-06-18 LAB — GLUCOSE, POC
Glucose (POC): 111 mg/dL — ABNORMAL HIGH (ref 65–100)
Glucose (POC): 167 mg/dL — ABNORMAL HIGH (ref 65–100)

## 2019-06-18 LAB — POCT GLUCOSE
POC Glucose: 111 mg/dL — ABNORMAL HIGH (ref 65–100)
POC Glucose: 167 mg/dL — ABNORMAL HIGH (ref 65–100)

## 2019-06-18 MED ORDER — GABAPENTIN 300 MG CAP
300 mg | ORAL_CAPSULE | Freq: Three times a day (TID) | ORAL | 0 refills | Status: DC
Start: 2019-06-18 — End: 2019-06-18

## 2019-06-18 MED ORDER — PANTOPRAZOLE 40 MG TAB, DELAYED RELEASE
40 mg | ORAL_TABLET | Freq: Two times a day (BID) | ORAL | 0 refills | Status: AC
Start: 2019-06-18 — End: 2019-07-18

## 2019-06-18 MED ORDER — PANTOPRAZOLE 40 MG TAB, DELAYED RELEASE
40 mg | ORAL_TABLET | Freq: Two times a day (BID) | ORAL | 0 refills | Status: DC
Start: 2019-06-18 — End: 2019-06-18

## 2019-06-18 MED ORDER — GABAPENTIN 300 MG CAP
300 mg | ORAL_CAPSULE | Freq: Three times a day (TID) | ORAL | 0 refills | Status: AC
Start: 2019-06-18 — End: 2019-07-18

## 2019-06-18 MED FILL — ACETAMINOPHEN 325 MG TABLET: 325 mg | ORAL | Qty: 2

## 2019-06-18 MED FILL — CHOLECALCIFEROL (VITAMIN D3) 1,000 UNIT (25 MCG) TAB: ORAL | Qty: 2

## 2019-06-18 MED FILL — SUCRALFATE 1 GRAM TAB: 1 gram | ORAL | Qty: 1

## 2019-06-18 MED FILL — PANTOPRAZOLE 40 MG TAB, DELAYED RELEASE: 40 mg | ORAL | Qty: 1

## 2019-06-18 MED FILL — DOK 100 MG CAPSULE: 100 mg | ORAL | Qty: 1

## 2019-06-18 MED FILL — GABAPENTIN 300 MG CAP: 300 mg | ORAL | Qty: 1

## 2019-06-18 NOTE — Progress Notes (Addendum)
Transition of Care Plan  ??? RUR- 19%   ??? DISPOSITION: The disposition plan is to transition to a SNF; The pt has been accepted by [facility] and [facility]   o Call Report: [phone] ask for the [place]      ??? F/U with PCP/Specialist    ??? Transport: Medical transport; 4:30pm transport scheduled with [facility]; The pt is now off of isolation-COVID negative       This cm contacted the facility to identify If there was an update pertaining to obtaining insurance auth. The rep stated that the pt's insurance Josem Kaufmann is still pending and they will update once obtained.      11:20    This cm spoke with Christine Admission liaison with [facility] regarding insurance [place]. Altha Harm stated that the insurance agency is requesting updated PT/OT notes. This cm informed the rep that the pt did not work with PT/OT over the weekend and that his last note was from 4/30. This cm faxed the OT note from 4/30 to [phone].     This cm perfect served the attending inquiring if PT/OT could be re-consulted, due to services being d/c. Consults pending.      This cm met with the pt at bedside to discuss the importance of working with the skilled therapies. The pt stated that he is willing to work with them if it will improve his chances of be approved for SNF.       1:55  This cm was informed that insurance auth had been received and they are available to accept the pt today.   4:30pm transport requested with [facility].         CM: Dunn. MSW,   [phone]

## 2019-06-18 NOTE — Discharge Summary (Addendum)
Discharge Summary       PATIENT ID: Michael Lozano  MRN: [id number]   DATE OF BIRTH: [birth year]    DATE OF ADMISSION: 06/02/2019 12:08 PM    DATE OF DISCHARGE: 06/18/19     PRIMARY CARE PROVIDER: Park Pope, NP     ATTENDING PHYSICIAN: Salem Senate, MD    DISCHARGING PROVIDER: Salem Senate, MD    To contact this individual call [phone] and ask the operator to page.  If unavailable ask to be transferred the Adult Hospitalist Department.    CONSULTATIONS: IP CONSULT TO NEPHROLOGY  IP CONSULT TO GASTROENTEROLOGY  IP CONSULT TO INTENSIVIST  IP CONSULT TO INFECTIOUS DISEASES  IP CONSULT TO PODIATRY  IP CONSULT TO HOSPITALIST    PROCEDURES/SURGERIES: * No surgery found *    ADMITTING DIAGNOSES & HOSPITAL COURSE:   Admission Summary:   Tyrae??is a 85 y.o.????M with PMH of recent COVID, PM, ??DM2, ??And overall??is a very??poor historian.??Information for HPI is obtained via chart and provider.????Per ER note "He has had a gradual decline, very poor p.o. intake to the point that he has not been eating or drinking for several days. ??He also is somnolent and less interactive. ??He has not fallen or had recurrent fevers. ??No vomiting, diarrhea, dyspnea."??He apparently loves with is grandson who is 'never home'. While in ER pt was found to be hypernatremic and hypotensive, ICU was consulted after coffee ground emesis??  ??  Interval history    Patient was admitted to the intensive care unit on 06/02/2019 after presenting to the emergency department with acute kidney injury, hypernatremia. ??Patient had been recently diagnosed with COVID-19 on 05/18/2019, and tested positive again on 06/02/2019. ??Per emergency department records, P "He has had a gradual decline, very poor p.o. intake to the point that he has not been eating or drinking for several days. ??He also is somnolent and less interactive. ??He has not fallen or had recurrent fevers".????Patient was also hypothermic and hypotensive at admission. ??    ??Mild  coffee-ground emesis was noted via nasogastric tube and gastroenterologist was consulted. ??They recommended supportive care with either PPI twice daily or Pepcid twice daily, and felt that EGD was not indicated.  Avoid nonsteroidals.    There was concern for sepsis, and he was started on IV fluids, and initial antibiotics IV cefepime and IV vancomycin.  ??He has been followed by infectious disease specialist. ??Patient has a right plantar foot diabetic wound just felt to be chronic by podiatrist who evaluated him during this hospital admission. ??However, cultures on 05/17/2019 from the wound was positive for light E.cloacae??complex, Providencia rettgeri sensitive to cefepime. ????ID recommended Zyvox instead of vancomycin due to the potential contribution to nephrotoxicity. ??IV Flagyl was added for concern for aspiration, and continued until 06/08/2019. ??It was documented by ID on 06/06/2019 that ??organisms in wound culture were probably colonization. urine cultures came back positive for ??Pseudomonas and ecoli. Infectious disease specialist has been following throughout, and commended to continue IV cefepime for 1 week, stopping on the night of 06/09/2019.    Prior to discharge, patient Covid test done again and came back negative.    DISCHARGE DIAGNOSES:      1. ??Generalized weakness/deconditioning. ??  2. ??Recent COVID-19 virus infection. ??  3. ??Diabetes mellitus and Diabetic neuropathy. ??  4. Acute kidney injury present on admission is now resolved. ??  5. Chronic right plantar diabetic wound  6. Upper GI bleed       PENDING TEST RESULTS:  None     FOLLOW UP APPOINTMENTS:    Follow-up Information     Follow up With Specialties Details Why Contact Info    Park Pope, NP Nurse Practitioner In 1 week  [address]  [place] [address]  [phone]      Park Pope, NP Nurse Practitioner   [address]  [place] [address]  [phone]             ADDITIONAL CARE RECOMMENDATIONS:     DIET:  Diabetic Diet  Oral Nutritional Supplements: GlucernaTwice daily    ACTIVITY: Activity as tolerated    WOUND CARE: as per protocol for right planter ulcer    EQUIPMENT needed: none       DISCHARGE MEDICATIONS:  Current Discharge Medication List      START taking these medications    Details   pantoprazole (PROTONIX) 40 mg tablet Take 1 Tab by mouth two (2) times a day for 30 days. Continue twice a day for 1 month and then daily.  Qty: 60 Tab, Refills: 0         CONTINUE these medications which have CHANGED    Details   gabapentin (NEURONTIN) 300 mg capsule Take 1 Cap by mouth three (3) times daily for 30 days. Max Daily Amount: 900 mg. Indications: neuropathic pain  Qty: 90 Cap, Refills: 0    Associated Diagnoses: Neuropathy         CONTINUE these medications which have NOT CHANGED    Details   acetaminophen (TYLENOL) 325 mg tablet Take 2 Tabs by mouth every six (6) hours as needed for Pain.  Qty: 20 Tab, Refills: 0      atorvastatin (LIPITOR) 40 mg tablet Take 40 mg by mouth daily.         STOP taking these medications       naproxen (NAPROSYN) 500 mg tablet Comments:   Reason for Stopping:         cefdinir (OMNICEF) 300 mg capsule Comments:   Reason for Stopping:         Ramipril 10 mg Tab Comments:   Reason for Stopping:         omeprazole (PRILOSEC) 20 mg capsule Comments:   Reason for Stopping:                 NOTIFY YOUR PHYSICIAN FOR ANY OF THE FOLLOWING:   Fever over 101 degrees for 24 hours.   Chest pain, shortness of breath, fever, chills, nausea, vomiting, diarrhea, change in mentation, falling, weakness, bleeding. Severe pain or pain not relieved by medications.  Or, any other signs or symptoms that you may have questions about.    DISPOSITION:    Home With:   OT  PT  HH  RN      x  SNF/Rehab    Independent/assisted living    Hospice    Other:       PATIENT CONDITION AT DISCHARGE:     Functional status    Poor     Deconditioned    x Independent      Cognition   x  Lucid     Forgetful     Dementia       Catheters/lines (plus indication)    Foley     PICC     PEG    x None      Code status   x  Full code     DNR  PHYSICAL EXAMINATION AT DISCHARGE:     Constitutional:  No acute distress, cooperative, pleasant??   ENT:  Oral mucosa moist, oropharynx benign.  Poor dentition.   Resp:  CTA bilaterally. No wheezing/rhonchi/rales. No accessory muscle use   CV:  Regular rhythm, normal rate, no murmurs, gallops, rubs    GI:  Soft, non distended, non tender. normoactive bowel sounds, no hepatosplenomegaly     Musculoskeletal:  No edema, warm, 2+ pulses throughout.  Right foot plantar ulcer with no signs of infection.    Neurologic:  Moves all extremities.  AAOx3, CN II-XII reviewed                         Psych:  Good insight, Not anxious nor agitated.        CHRONIC MEDICAL DIAGNOSES:  Problem List as of 06/18/2019 Date Reviewed: 06-08-2019          Codes Class Noted - Resolved    Hypernatremia ICD-10-CM: E87.0  ICD-9-CM: 276.0  06/02/2019 - Present        AKI (acute kidney injury) ([place]) ICD-10-CM: N17.9  ICD-9-CM: 584.9  06/02/2019 - Present        GI bleed ICD-10-CM: K92.2  ICD-9-CM: 578.9  06/02/2019 - Present        Cardiomyopathy ([place]) ICD-10-CM: I42.9  ICD-9-CM: 425.4  08/09/2011 - Present    Overview Signed 08/09/2011  7:58 PM by Daphene Jaeger III, MD     6/13 echo lvef 40% with multiple regional wall motion abnormalities.  Biventricular aicd, details unknown.             CAD (coronary artery disease) ICD-10-CM: I25.10  ICD-9-CM: 414.00  08/09/2011 - Present    Overview Signed 08/09/2011  7:58 PM by Anise Salvo, MD     Multivessel bypass, details unknown                   Greater than 30 minutes were spent with the patient on counseling and coordination of care    Signed:   Joanna Hews, MD  06/18/2019  4:00 PM

## 2019-06-18 NOTE — Progress Notes (Signed)
Problem: Falls - Risk of  Goal: *Absence of Falls  Description: Document Schmid Fall Risk and appropriate interventions in the flowsheet.  Outcome: Progressing Towards Goal  Note: Fall Risk Interventions:  Mobility Interventions: Communicate number of staff needed for ambulation/transfer    Mentation Interventions: Bed/chair exit alarm    Medication Interventions: Evaluate medications/consider consulting pharmacy    Elimination Interventions: Call light in reach    History of Falls Interventions: Door open when patient unattended    Problem: Patient Education: Go to Patient Education Activity  Goal: Patient/Family Education  Outcome: Progressing Towards Goal     Problem: Pressure Injury - Risk of  Goal: *Prevention of pressure injury  Description: Document Braden Scale and appropriate interventions in the flowsheet.  Outcome: Progressing Towards Goal  Note: Pressure Injury Interventions:  Sensory Interventions: Assess changes in LOC, Float heels, Keep linens dry and wrinkle-free, Maintain/enhance activity level, Turn and reposition approx. every two hours (pillows and wedges if needed)    Moisture Interventions: Maintain skin hydration (lotion/cream), Minimize layers, Moisture barrier, Absorbent underpads    Activity Interventions: Increase time out of bed    Mobility Interventions: Chair cushion, Turn and reposition approx. every two hours(pillow and wedges)    Nutrition Interventions: Document food/fluid/supplement intake    Friction and Shear Interventions: HOB 30 degrees or less    Problem: Patient Education: Go to Patient Education Activity  Goal: Patient/Family Education  Outcome: Progressing Towards Goal     Problem: Discharge Planning  Description: See CM notes.  Jane Reilly RN BSN CCM    Goal: *Discharge to safe environment  Outcome: Progressing Towards Goal     Problem: Patient Education: Go to Patient Education Activity  Goal: Patient/Family Education  Outcome: Progressing Towards Goal     Problem:  Patient Education: Go to Patient Education Activity  Goal: Patient/Family Education  Outcome: Progressing Towards Goal     Problem: Nutrition Deficit  Goal: *Optimize nutritional status  Outcome: Progressing Towards Goal

## 2019-06-18 NOTE — Progress Notes (Signed)
Comprehensive Nutrition Assessment    Type and Reason for Visit: Reassess      Nutrition Recommendations/Plan:      Continue current diet regimen      Nutrition Assessment:     PMHx includes DM and recent COVID 19 infection.  Admitted with poor PO intake, AKI which has resolved.  Awaiting insurance auth for rehab placement - appears he has been accepted and may possibly d/c today.    Discussed in rounds.  Overall - continues to eat very well despite c/o taste changes.  He may be bored, but frequently requesting new / additional trays and snack options.  Complains we aren't feeding him enough despite being since double portions, snacks, and ONS.        Malnutrition Assessment:  Malnutrition Status:  At risk for malnutrition (but not enough info - possible signicant wt loss)        Nutritionally Significant Medications:   Vit D3, Colace, correctional scale, Protonix, carafate  PRN: Buminate      Estimated Daily Nutrient Needs:  Energy (kcal): 1900(MSJ x 1.2)  Protein (g): 95(1.1 gm/kg or ~20%)  Fluid (ml/day): 1 mL/kcal  Weight used: 87.5 kg    Nutrition Related Findings:   Edema: none  Last BM: 5/2 - soft      Wounds:    Full thickness, Diabetic ulcer   Per WOCN 4/23:  POA??right plantar diabetic foot ulcer??  Full thickness??      Current Nutrition Therapies:  Diet: Regular, double portions  Supplements: Ensure Enlive BID at breakfast and dinner; Ensure High Protein at lunch   Meal intake: No data found.  Supplement intake: No data found.        Anthropometric Measures:  ?? Height:  5' 10" (177.8 cm)  ?? Current Body Wt:  87.5 kg (192 lb 12.8 oz)   ?? Ideal Body Wt:  166 lbs:  116.1 %   ?? BMI Category:  Overweight (BMI 25.0-29.9)       Wt Readings from Last 20 Encounters:   06/10/19 87.5 kg (192 lb 12.8 oz) - source not specified   06/09/19 74.8 kg (164 lb 14.4 oz) - bed   05/28/19 77.7 kg (171 lb 4.8 oz)   05/18/19 89.8 kg (198 lb)   05/17/19 93.4 kg (205 lb 14.6 oz)   10/21/18 93.4 kg (206 lb)   08/18/11 86.8 kg (191 lb  5.8 oz)   07/23/11 90.2 kg (198 lb 14.4 oz)       Nutrition Diagnosis:   ?? Increased nutrient needs related to increased demand for energy/nutrients as evidenced by wounds      Nutrition Interventions:   Food and/or Nutrient Delivery: Continue current diet, Continue oral nutrition supplement  Nutrition Education and Counseling: No recommendations at this time  Coordination of Nutrition Care: Continue to monitor while inpatient, Interdisciplinary rounds    Goals:  PO >50% of most meals with 1 ONS daily over next 7 days       Nutrition Monitoring and Evaluation:   Behavioral-Environmental Outcomes: None identified  Food/Nutrient Intake Outcomes: Food and nutrient intake, Supplement intake, IVF intake  Physical Signs/Symptoms Outcomes: Biochemical data, GI status, Meal time behavior, Skin, Weight    Discharge Planning:    Continue current diet, Continue oral nutrition supplement(if taking ONS)     No results for input(s): GLU, BUN, CREA, NA, K, CL, CO2, CA, PHOS, MG in the last 72 hours.    No results for input(s): LAC in the last 72   hours.    No results for input(s): WBC, HGB, HCT, PLT, HGBEXT, HCTEXT, PLTEXT, HGBEXT, HCTEXT, PLTEXT, HGBEXT, HCTEXT, PLTEXT in the last 72 hours.    Recent Labs     06/18/19  1100   GLUCPOC 111*       Lab Results   Component Value Date/Time    Hemoglobin A1c 6.2 (H) 06/03/2019 08:48 AM    Hemoglobin A1c 5.8 08/09/2011 12:15 PM         Meghan Jarome Matin, RD  Available via Perfect[employer]

## 2019-06-18 NOTE — Progress Notes (Addendum)
1730 Called [facility] to give report but was put on hold and eventually was hung up on. Will try again later.    1830 Attempted to give report again to [facility], left on hold and then was hung up on again.    1926 Called for a third time and informed them that the patient is en route and that my shift was ending. I was put on hold again and hung up on. AVS was sent with patient to the facility as well as the med rec, prescriptions, and H&P.

## 2019-06-18 NOTE — Progress Notes (Addendum)
Transition of Care Plan to SNF/Rehab    SNF/Rehab Transition:  Patient has been accepted to [facility] and meets criteria for admission.   Patient will transported by [facility] and expected to leave at 4:40pm.    Communication to Patient/Family:  Met with patient and they are agreeable to the transition plan.    Communication to SNF/Rehab:  Bedside RN, Anderson Malta, has been notified to update the transition plan to the facility and call report (phone number [phone]).[name]  Discharge information has been updated on the AVS.     Discharge instructions to be fax'd to facility at [facility] # [phone]).       Nursing Please include all hard scripts for controlled substances, med rec and dc summary, and AVS in packet.     Reviewed and confirmed with facility, [facility], can manage the patient care needs for the following:     SNF/Rehab Transition:  Patient to follow-up with Home Health: [facility])  PCP/Specialist:    Reviewed and confirmed with facility, Admission Liaison they can manage the patient care needs for the following:     [place]ta [place] with (X) only those applicable:    Medication:  [x]   Medications will be available at the facility  []   IV Antibiotics  []   Controlled Substance - hard copy to be sent with patient   []   Weekly Labs   Documents:  [x]  Hard RX  [x]  MAR  [x]  Kardex  [x]  AVS  [] Transfer Summary  [x] Discharge   Equipment:  []   CPAP/BiPAP  []   Wound Vacuum  []   Foley or Urinary Device  []   PICC/Central Line  []   Nebulizer  []   Ventilator   Treatment:  [] Isolation (for MRSA, VRE, etc.)  [] Surgical Drain Management  [] Tracheostomy Care  [] Dressing Changes  [] Dialysis with transportation and chair time.  [] PEG Care  [] Oxygen  [] Daily Weights for Heart Failure   Dietary:  [] Any diet limitations  [] Tube Feedings   [] Total Parenteral Management (TPN)   Eligible for Medicaid Long Term Services and Supports  Yes:  []  Eligible for medical assistance or will become  eligible within 180 days and UAI completed.   []  Provider/Patient and/or support system has requested screening.  []  UAI copy provided to patient or responsible party,.  []  UAI unavailable at discharge will send once processed to SNF provider.  []  UAI unavailable at discharged mailed to patient  No:   [x]  Private pay and is not financially eligible for Medicaid within the next 180 days.  []  Reside out-of-state.  []  A residents of a state owned/operated facility that is licensed  by Department of [place] and [facility] or [facility]  []  Enrollment in [place] hospice services  []  Non [place] citizen  []  Patient /Family declines to have screening completed or provide financial information for screening     Financial Resources:  Medicaid    []  Initiated and application pending   []  Full coverage     Advanced Care Plan:  [] Surrogate Decision Maker of Care  [] POA  [x] Communicated Code Status , "Full")    Other     CM: Brenda. MSW,   [phone]

## 2019-06-18 NOTE — Discharge Summary (Signed)
Discharge Summary by Salem Senate, MD at 06/18/19 1600                Author: Salem Senate, MD  Service: Internal Medicine  Author Type: Physician       Filed: 06/18/19 1611  Date of Service: 06/18/19 1600  Status: Addendum          Editor: Salem Senate, MD (Physician)          Related Notes: Original Note by Salem Senate, MD (Physician) filed at 06/18/19 1610                       Discharge Summary           PATIENT ID: Michael Lozano   MRN: [id number]     DATE OF BIRTH: [birth year]      DATE OF ADMISSION: 06/02/2019 12:08 PM      DATE OF DISCHARGE:  06/18/19       PRIMARY CARE PROVIDER:  Park Pope, NP       ATTENDING PHYSICIAN: Salem Senate, MD      DISCHARGING PROVIDER:  Salem Senate, MD     To contact this individual call [phone] and ask the operator to page.  If unavailable ask to be transferred the Adult Hospitalist Department.      CONSULTATIONS: IP CONSULT TO NEPHROLOGY   IP CONSULT TO GASTROENTEROLOGY   IP CONSULT TO INTENSIVIST   IP CONSULT TO INFECTIOUS DISEASES   IP CONSULT TO PODIATRY   IP CONSULT TO HOSPITALIST      PROCEDURES/SURGERIES: * No surgery found *      ADMITTING DIAGNOSES & HOSPITAL  COURSE:      Admission Summary:     Michael Lozano??is a 85 y.o.????M with PMH of recent COVID, PM, ??DM2, ??And overall??is a very??poor historian.??Information for HPI is obtained via  chart and provider.????Per ER note "He has had a gradual decline, very poor p.o. intake to the point that he has not been eating or drinking for several days. ??He also is somnolent and less interactive. ??He has not fallen or had recurrent fevers.  ??No vomiting, diarrhea, dyspnea."??He apparently loves with is grandson who is 'never home'. While in ER pt was found to be hypernatremic and hypotensive, ICU was consulted after coffee ground emesis??   ??     Interval history      Patient was admitted to the intensive care unit on 06/02/2019 after presenting to the emergency department with  acute kidney injury, hypernatremia. ??Patient  had been recently diagnosed with COVID-19 on 05/18/2019, and tested positive again on 06/02/2019. ??Per emergency department records, P "He has had a gradual decline, very poor p.o. intake to the point that he has not been eating or drinking for several  days. ??He also is somnolent and less interactive. ??He has not fallen or had recurrent fevers".????Patient was also hypothermic and  hypotensive at admission. ??      ??Mild coffee-ground emesis was noted via nasogastric tube and gastroenterologist was consulted. ??They recommended supportive care with either PPI twice daily or Pepcid twice  daily, and felt that EGD was not indicated.  Avoid nonsteroidals.      There was concern for sepsis, and he was started on IV fluids, and initial antibiotics IV cefepime and IV vancomycin.  ??He has been followed by infectious disease specialist.  ??Patient has a right plantar foot diabetic wound just felt to be chronic by podiatrist who  evaluated him during this hospital admission. ??However, cultures on 05/17/2019 from the wound was positive for light E.cloacae??complex, Providencia rettgeri  sensitive to cefepime. ????ID recommended Zyvox instead of vancomycin due to the potential contribution to nephrotoxicity. ??IV Flagyl was added for concern for aspiration, and continued until 06/08/2019. ??It was documented by ID on 06/06/2019  that ??organisms in wound culture were probably colonization. urine cultures came back positive for ??Pseudomonas and ecoli. Infectious disease specialist has been following throughout, and commended to continue IV cefepime for 1 week, stopping on  the night of 06/09/2019.      Prior to discharge, patient Covid test done again and came back negative.        DISCHARGE DIAGNOSES:            1. ??Generalized weakness/deconditioning. ??   2. ??Recent COVID-19 virus infection. ??   3. ??Diabetes mellitus and Diabetic neuropathy. ??   4. Acute kidney injury present on admission is now  resolved. ??   5. Chronic right plantar diabetic wound   6. Upper GI bleed           PENDING TEST RESULTS:    None       FOLLOW UP APPOINTMENTS:       Follow-up Information               Follow up With  Specialties  Details  Why  Contact Info              Galen Daft, NP  Nurse Practitioner  In 1 week    [address]   [place] [address]   [phone]                 Galen Daft, NP  Nurse Practitioner      [address]   [place] [phone]   [phone]                    ADDITIONAL CARE RECOMMENDATIONS:       DIET: Diabetic Diet   Oral Nutritional Supplements: GlucernaTwice daily      ACTIVITY: Activity as tolerated      WOUND CARE: as per protocol for right planter ulcer      EQUIPMENT needed: none          DISCHARGE MEDICATIONS:     Current Discharge Medication List              START taking these medications          Details        pantoprazole (PROTONIX) 40 mg tablet  Take 1 Tab by mouth two (2) times a day for 30 days. Continue twice a day for 1 month and then daily.   Qty: 60 Tab, Refills:  0                     CONTINUE these medications which have CHANGED          Details        gabapentin (NEURONTIN) 300 mg capsule  Take 1 Cap by mouth three (3) times daily for 30 days. Max Daily Amount: 900 mg. Indications: neuropathic pain   Qty: 90 Cap, Refills:  0          Associated Diagnoses: Neuropathy                     CONTINUE these medications which have NOT CHANGED  Details        acetaminophen (TYLENOL) 325 mg tablet  Take 2 Tabs by mouth every six (6) hours as needed for Pain.   Qty: 20 Tab, Refills:  0               atorvastatin (LIPITOR) 40 mg tablet  Take 40 mg by mouth daily.                     STOP taking these medications                  naproxen (NAPROSYN) 500 mg tablet  Comments:    Reason for Stopping:                      cefdinir (OMNICEF) 300 mg capsule  Comments:    Reason for Stopping:                      Ramipril 10 mg Tab  Comments:    Reason for  Stopping:                      omeprazole (PRILOSEC) 20 mg capsule  Comments:    Reason for Stopping:                                NOTIFY YOUR PHYSICIAN FOR ANY OF THE FOLLOWING:    Fever over 101 degrees for 24 hours.    Chest pain, shortness of breath, fever, chills, nausea, vomiting, diarrhea, change in mentation, falling, weakness, bleeding. Severe pain or pain not relieved by medications.   Or, any other signs or symptoms that you may have questions about.      DISPOSITION:         Home With:     OT    PT    HH    RN                 x   SNF/Rehab       Independent/assisted living          Hospice          Other:           PATIENT CONDITION AT DISCHARGE:       Functional status         Poor        Deconditioned         x  Independent         Cognition       x   Lucid        Forgetful           Dementia         Catheters/lines (plus indication)         Foley        PICC        PEG         x  None         Code status       x   Full code           DNR         PHYSICAL EXAMINATION AT DISCHARGE:          Constitutional:   No acute distress, cooperative, pleasant??     ENT:   Oral mucosa moist, oropharynx benign.  Poor dentition.  Resp:   CTA bilaterally. No wheezing/rhonchi/rales. No accessory muscle use     CV:   Regular rhythm, normal rate, no murmurs, gallops, rubs      GI:   Soft, non distended, non tender. normoactive bowel sounds, no hepatosplenomegaly       Musculoskeletal:   No edema, warm, 2+ pulses throughout.  Right foot plantar ulcer with no signs of infection.      Neurologic:   Moves all extremities.  AAOx3, CN II-XII reviewed                           Psych:  Good insight, Not anxious nor agitated.            CHRONIC MEDICAL DIAGNOSES:      Problem List  as of 06/18/2019  Date Reviewed:  2019/06/15                        Codes  Class  Noted - Resolved             Hypernatremia  ICD-10-CM: E87.0   ICD-9-CM: 276.0    06/02/2019 - Present                       AKI (acute kidney injury) (HCC)  ICD-10-CM:  N17.9   ICD-9-CM: 584.9    06/02/2019 - Present                       GI bleed  ICD-10-CM: K92.2   ICD-9-CM: 578.9    06/02/2019 - Present                       Cardiomyopathy (HCC)  ICD-10-CM: I42.9   ICD-9-CM: 425.4    08/09/2011 - Present          Overview Signed 08/09/2011  7:58 PM by Carmelia Roller III, MD            6/13 echo lvef 40% with multiple regional wall motion abnormalities.   Biventricular aicd, details unknown.                                   CAD (coronary artery disease)  ICD-10-CM: I25.10   ICD-9-CM: 414.00    08/09/2011 - Present          Overview Signed 08/09/2011  7:58 PM by Rodena Medin, MD            Multivessel bypass, details unknown                                      Greater than 30 minutes were spent with the patient on counseling and coordination of care      Signed:    Salem Senate, MD   06/18/2019   4:00 PM

## 2019-06-18 NOTE — Progress Notes (Signed)
Problem: Falls - Risk of  Goal: *Absence of Falls  Description: Document Bridgette Habermann Fall Risk and appropriate interventions in the flowsheet.  Outcome: Progressing Towards Goal  Note: Fall Risk Interventions:  Mobility Interventions: Communicate number of staff needed for ambulation/transfer    Mentation Interventions: Bed/chair exit alarm    Medication Interventions: Evaluate medications/consider consulting pharmacy    Elimination Interventions: Call light in reach    History of Falls Interventions: Door open when patient unattended    Problem: Patient Education: Go to Patient Education Activity  Goal: Patient/Family Education  Outcome: Progressing Towards Goal     Problem: Pressure Injury - Risk of  Goal: *Prevention of pressure injury  Description: Document Braden Scale and appropriate interventions in the flowsheet.  Outcome: Progressing Towards Goal  Note: Pressure Injury Interventions:  Sensory Interventions: Assess changes in LOC, Float heels, Keep linens dry and wrinkle-free, Maintain/enhance activity level, Turn and reposition approx. every two hours (pillows and wedges if needed)    Moisture Interventions: Maintain skin hydration (lotion/cream), Minimize layers, Moisture barrier, Absorbent underpads    Activity Interventions: Increase time out of bed    Mobility Interventions: Chair cushion, Turn and reposition approx. every two hours(pillow and wedges)    Nutrition Interventions: Document food/fluid/supplement intake    Friction and Shear Interventions: HOB 30 degrees or less    Problem: Patient Education: Go to Patient Education Activity  Goal: Patient/Family Education  Outcome: Progressing Towards Goal     Problem: Discharge Planning  Description: See CM notes.  Lurena Nida RN BSN CCM    Goal: *Discharge to safe environment  Outcome: Progressing Towards Goal     Problem: Patient Education: Go to Patient Education Activity  Goal: Patient/Family Education  Outcome: Progressing Towards Goal     Problem:  Patient Education: Go to Patient Education Activity  Goal: Patient/Family Education  Outcome: Progressing Towards Goal     Problem: Nutrition Deficit  Goal: *Optimize nutritional status  Outcome: Progressing Towards Goal

## 2019-06-18 NOTE — Progress Notes (Signed)
1730 Called [facility] to give report but was put on hold and eventually was hung up on. Will try again later.    1830 Attempted to give report again to [place], left on hold and then was hung up on again.    1926 Called for a third time and informed them that the patient is en route and that my shift was ending. I was put on hold again and hung up on. AVS was sent with patient to the facility as well as the med rec, prescriptions, and H&P.

## 2019-06-18 NOTE — Progress Notes (Signed)
 Comprehensive Nutrition Assessment    Type and Reason for Visit: Reassess      Nutrition Recommendations/Plan:      Continue current diet regimen      Nutrition Assessment:     PMHx includes DM and recent COVID 19 infection.  Admitted with poor PO intake, AKI which has resolved.  Awaiting insurance auth for rehab placement - appears he has been accepted and may possibly d/c today.    Discussed in rounds.  Overall - continues to eat very well despite c/o taste changes.  He may be bored, but frequently requesting new / additional trays and snack options.  Complains we aren't feeding him enough despite being since double portions, snacks, and ONS.        Malnutrition Assessment:  Malnutrition Status:  At risk for malnutrition (but not enough info - possible signicant wt loss)        Nutritionally Significant Medications:   Vit D3, Colace, correctional scale, Protonix, carafate  PRN: Buminate      Estimated Daily Nutrient Needs:  Energy (kcal): 1900(MSJ x 1.2)  Protein (g): 95(1.1 gm/kg or ~20%)  Fluid (ml/day): 1 mL/kcal  Weight used: 87.5 kg    Nutrition Related Findings:   Edema: none  Last BM: 5/2 - soft      Wounds:    Full thickness, Diabetic ulcer   Per WOCN 4/23:  POAright plantar diabetic foot ulcer  Full thickness      Current Nutrition Therapies:  Diet: Regular, double portions  Supplements: Ensure Enlive BID at breakfast and dinner; Ensure High Protein at lunch   Meal intake: No data found.  Supplement intake: No data found.        Anthropometric Measures:   Height:  5' 10 (177.8 cm)   Current Body Wt:  87.5 kg (192 lb 12.8 oz)    Ideal Body Wt:  166 lbs:  116.1 %    BMI Category:  Overweight (BMI 25.0-29.9)       Wt Readings from Last 20 Encounters:   06/10/19 87.5 kg (192 lb 12.8 oz) - source not specified   06/09/19 74.8 kg (164 lb 14.4 oz) - bed   05/28/19 77.7 kg (171 lb 4.8 oz)   05/18/19 89.8 kg (198 lb)   05/17/19 93.4 kg (205 lb 14.6 oz)   10/21/18 93.4 kg (206 lb)   08/18/11 86.8 kg (191 lb  5.8 oz)   07/23/11 90.2 kg (198 lb 14.4 oz)       Nutrition Diagnosis:    Increased nutrient needs related to increased demand for energy/nutrients as evidenced by wounds      Nutrition Interventions:   Food and/or Nutrient Delivery: Continue current diet, Continue oral nutrition supplement  Nutrition Education and Counseling: No recommendations at this time  Coordination of Nutrition Care: Continue to monitor while inpatient, Interdisciplinary rounds    Goals:  PO >50% of most meals with 1 ONS daily over next 7 days       Nutrition Monitoring and Evaluation:   Behavioral-Environmental Outcomes: None identified  Food/Nutrient Intake Outcomes: Food and nutrient intake, Supplement intake, IVF intake  Physical Signs/Symptoms Outcomes: Biochemical data, GI status, Meal time behavior, Skin, Weight    Discharge Planning:    Continue current diet, Continue oral nutrition supplement(if taking ONS)     No results for input(s): GLU, BUN, CREA, NA, K, CL, CO2, CA, PHOS, MG in the last 72 hours.    No results for input(s): LAC in the last 72  hours.    No results for input(s): WBC, HGB, HCT, PLT, HGBEXT, HCTEXT, PLTEXT, HGBEXT, HCTEXT, PLTEXT, HGBEXT, HCTEXT, PLTEXT in the last 72 hours.    Recent Labs     06/18/19  1100   GLUCPOC 111*       Lab Results   Component Value Date/Time    Hemoglobin A1c 6.2 (H) 06/03/2019 08:48 AM    Hemoglobin A1c 5.8 08/09/2011 12:15 PM         Meghan JONELLE Molt, RD  Available via [employer]

## 2019-06-18 NOTE — Progress Notes (Signed)
 Transition of Care Plan to SNF/Rehab    SNF/Rehab Transition:  Patient has been accepted to [facility] and meets criteria for admission.   Patient will transported by [facility] and expected to leave at 4:40pm.    Communication to Patient/Family:  Met with patient and they are agreeable to the transition plan.    Communication to SNF/Rehab:  Bedside RN, Michael Lozano, has been notified to update the transition plan to the facility and call report (phone number [phone]).[name]  Discharge information has been updated on the AVS.     Discharge instructions to be fax'd to facility at [facility] # [phone]).       Nursing Please include all hard scripts for controlled substances, med rec and dc summary, and AVS in packet.     Reviewed and confirmed with facility, Michael Lozano, can manage the patient care needs for the following:     SNF/Rehab Transition:  Patient to follow-up with Home Health: [facility], other,none)  PCP/Specialist:    Reviewed and confirmed with facility, Admission Liaison they can manage the patient care needs for the following:     Michael Lozano with (X) only those applicable:    Medication:  [x]   Medications will be available at the facility  []   IV Antibiotics  []   Controlled Substance - hard copy to be sent with patient   []   Weekly Labs   Documents:  [x]  Hard RX  [x]  MAR  [x]  Kardex  [x]  AVS  [] Transfer Summary  [x] Discharge   Equipment:  []   CPAP/BiPAP  []   Wound Vacuum  []   Foley or Urinary Device  []   PICC/Central Line  []   Nebulizer  []   Ventilator   Treatment:  [] Isolation (for MRSA, VRE, etc.)  [] Surgical Drain Management  [] Tracheostomy Care  [] Dressing Changes  [] Dialysis with transportation and chair time.  [] PEG Care  [] Oxygen  [] Daily Weights for Heart Failure   Dietary:  [] Any diet limitations  [] Tube Feedings   [] Total Parenteral Management (TPN)   Eligible for Medicaid Long Term Services and Supports  Yes:  []  Eligible for medical assistance or will become  eligible within 180 days and UAI completed.   []  Provider/Patient and/or support system has requested screening.  []  UAI copy provided to patient or responsible party,.  []  UAI unavailable at discharge will send once processed to SNF provider.  []  UAI unavailable at discharged mailed to patient  No:   [x]  Private pay and is not financially eligible for Medicaid within the next 180 days.  []  Reside out-of-state.  []  A residents of a state owned/operated facility that is licensed  by Department of Behavioral Health and Developmental Services or [facility]  []  Enrollment in [place] hospice services  []  Non [place]  citizen  []  Patient /Family declines to have screening completed or provide financial information for screening     Financial Resources:  Medicaid    []  Initiated and application pending   []  Full coverage     Advanced Care Plan:  [] Surrogate Decision Maker of Care  [] POA  [x] Communicated Code Status , Full)    Other     CM: Michael HICKMAN JR. MSW,   [phone]

## 2019-06-18 NOTE — Progress Notes (Signed)
Transition of Care Plan  . RUR- 19%   . DISPOSITION: The disposition plan is to transition to a SNF; The pt has been accepted by [facility]   o Call Report: [phone] ask for the [facility] [id number]  o [facility]      . F/U with PCP/Specialist    . Transport: Medical transport; 4:30pm transport scheduled with [facility]; The pt is now off of isolation-COVID negative       This cm contacted the facility to identify If there was an update pertaining to obtaining insurance auth. The rep stated that the pt's insurance Berkley Harvey is still pending and they will update once obtained.      11:20    This cm spoke with Christine Admission liaison with [facility] regarding insurance auth. Wynona Canes stated that the insurance agency is requesting updated PT/OT notes. This cm informed the rep that the pt did not work with PT/OT over the weekend and that his last note was from 4/30. This cm faxed the OT note from 4/30 to [phone].     This cm perfect served the attending inquiring if PT/OT could be re-consulted, due to services being d/c. Consults pending.      This cm met with the pt at bedside to discuss the importance of working with the skilled therapies. The pt stated that he is willing to work with them if it will improve his chances of be approved for SNF.       1:55  This cm was informed that insurance auth had been received and they are available to accept the pt today.   4:30pm transport requested with [facility].         CM: DERWIN Ned Clines MSW,   [phone]

## 2019-08-07 ENCOUNTER — Inpatient Hospital Stay
Admit: 2019-08-07 | Discharge: 2019-08-07 | Disposition: A | Discharge status: Home / Self Care | Payer: MEDICARE | Attending: Emergency Medicine

## 2019-08-07 ENCOUNTER — Emergency Department: Payer: MEDICARE | Primary: Adult Health

## 2019-08-07 ENCOUNTER — Emergency Department: Admit: 2019-08-07 | Payer: MEDICARE | Primary: Adult Health

## 2019-08-07 DIAGNOSIS — L97512 Non-pressure chronic ulcer of other part of right foot with fat layer exposed: Secondary | ICD-10-CM

## 2019-08-07 LAB — CBC WITH AUTOMATED DIFF
ABS. BASOPHILS: 0 10*3/uL (ref 0.0–0.1)
ABS. EOSINOPHILS: 0.2 10*3/uL (ref 0.0–0.4)
ABS. IMM. GRANS.: 0 10*3/uL (ref 0.00–0.04)
ABS. LYMPHOCYTES: 1.4 10*3/uL (ref 0.8–3.5)
ABS. MONOCYTES: 0.7 10*3/uL (ref 0.0–1.0)
ABS. NEUTROPHILS: 3.9 10*3/uL (ref 1.8–8.0)
ABSOLUTE NRBC: 0 10*3/uL (ref 0.00–0.01)
BASOPHILS: 0 % (ref 0–1)
EOSINOPHILS: 4 % (ref 0–7)
HCT: 31.7 % — ABNORMAL LOW (ref 36.6–50.3)
HGB: 10.6 g/dL — ABNORMAL LOW (ref 12.1–17.0)
IMMATURE GRANULOCYTES: 0 % (ref 0.0–0.5)
LYMPHOCYTES: 22 % (ref 12–49)
MCH: 28.5 PG (ref 26.0–34.0)
MCHC: 33.4 g/dL (ref 30.0–36.5)
MCV: 85.2 FL (ref 80.0–99.0)
MONOCYTES: 12 % (ref 5–13)
MPV: 10.6 FL (ref 8.9–12.9)
NEUTROPHILS: 62 % (ref 32–75)
NRBC: 0 PER 100 WBC
PLATELET: 289 10*3/uL (ref 150–400)
RBC: 3.72 M/uL — ABNORMAL LOW (ref 4.10–5.70)
RDW: 17.2 % — ABNORMAL HIGH (ref 11.5–14.5)
WBC: 6.3 10*3/uL (ref 4.1–11.1)

## 2019-08-07 LAB — METABOLIC PANEL, COMPREHENSIVE
A-G Ratio: 0.7 — ABNORMAL LOW (ref 1.1–2.2)
ALT (SGPT): 18 U/L (ref 12–78)
AST (SGOT): 29 U/L (ref 15–37)
Albumin: 3.4 g/dL — ABNORMAL LOW (ref 3.5–5.0)
Alk. phosphatase: 100 U/L (ref 45–117)
Anion gap: 5 mmol/L (ref 5–15)
BUN/Creatinine ratio: 9 — ABNORMAL LOW (ref 12–20)
BUN: 11 MG/DL (ref 6–20)
Bilirubin, total: 0.5 MG/DL (ref 0.2–1.0)
CO2: 23 mmol/L (ref 21–32)
Calcium: 8.9 MG/DL (ref 8.5–10.1)
Chloride: 112 mmol/L — ABNORMAL HIGH (ref 97–108)
Creatinine: 1.29 MG/DL (ref 0.70–1.30)
GFR est AA: >60 mL/min/{1.73_m2} (ref >60)
GFR est non-AA: 53 mL/min/{1.73_m2} — ABNORMAL LOW (ref >60)
Globulin: 5.1 g/dL — ABNORMAL HIGH (ref 2.0–4.0)
Glucose: 98 mg/dL (ref 65–100)
Potassium: 3.8 mmol/L (ref 3.5–5.1)
Protein, total: 8.5 g/dL — ABNORMAL HIGH (ref 6.4–8.2)
Sodium: 140 mmol/L (ref 136–145)

## 2019-08-07 LAB — BLOOD GAS,CHEM8,LACTIC ACID POC
BICARBONATE: 22 mmol/L
Base deficit (POC): 2.5 mmol/L
CO2, POC: 23 MMOL/L (ref 19–24)
Calcium, ionized (POC): 1.16 mmol/L (ref 1.12–1.32)
Chloride, POC: 111 MMOL/L — ABNORMAL HIGH (ref 100–108)
Creatinine, POC: 1.3 MG/DL (ref 0.6–1.3)
Glucose, POC: 105 MG/DL (ref 74–106)
Lactic Acid (POC): 2.21 mmol/L — CR (ref 0.40–2.00)
Potassium, POC: 3.9 MMOL/L (ref 3.5–5.5)
Sodium, POC: 145 MMOL/L (ref 136–145)
pCO2, venous (POC): 36.2 MMHG — ABNORMAL LOW (ref 41–51)
pH, venous (POC): 7.39 (ref 7.32–7.42)
pO2, venous (POC): 44 mmHg — ABNORMAL HIGH (ref 25–40)

## 2019-08-07 LAB — C REACTIVE PROTEIN, QT: C-Reactive protein: <0.29 mg/dL (ref 0.00–0.60)

## 2019-08-07 LAB — POC TROPONIN-I
POC Troponin I: <0.04 ng/mL (ref 0.00–0.08)
Troponin-I (POC): <0.04 ng/mL (ref 0.00–0.08)

## 2019-08-07 LAB — POC LACTIC ACID: Lactic Acid (POC): 1.27 mmol/L (ref 0.40–2.00)

## 2019-08-07 LAB — COMPREHENSIVE METABOLIC PANEL
ALT: 18 U/L (ref 12–78)
AST: 29 U/L (ref 15–37)
Albumin/Globulin Ratio: 0.7 — ABNORMAL LOW (ref 1.1–2.2)
Albumin: 3.4 g/dL — ABNORMAL LOW (ref 3.5–5.0)
Alkaline Phosphatase: 100 U/L (ref 45–117)
Anion Gap: 5 mmol/L (ref 5–15)
BUN: 11 MG/DL (ref 6–20)
Bun/Cre Ratio: 9 — ABNORMAL LOW (ref 12–20)
CO2: 23 mmol/L (ref 21–32)
Calcium: 8.9 MG/DL (ref 8.5–10.1)
Chloride: 112 mmol/L — ABNORMAL HIGH (ref 97–108)
Creatinine: 1.29 MG/DL (ref 0.70–1.30)
EGFR IF NonAfrican American: 53 mL/min/{1.73_m2} — ABNORMAL LOW (ref >60)
GFR African American: >60 mL/min/{1.73_m2} (ref >60)
Globulin: 5.1 g/dL — ABNORMAL HIGH (ref 2.0–4.0)
Glucose: 98 mg/dL (ref 65–100)
Potassium: 3.8 mmol/L (ref 3.5–5.1)
Sodium: 140 mmol/L (ref 136–145)
Total Bilirubin: 0.5 MG/DL (ref 0.2–1.0)
Total Protein: 8.5 g/dL — ABNORMAL HIGH (ref 6.4–8.2)

## 2019-08-07 LAB — CBC WITH AUTO DIFFERENTIAL
Basophils %: 0 % (ref 0–1)
Basophils Absolute: 0 10*3/uL (ref 0.0–0.1)
Eosinophils %: 4 % (ref 0–7)
Eosinophils Absolute: 0.2 10*3/uL (ref 0.0–0.4)
Granulocyte Absolute Count: 0 10*3/uL (ref 0.00–0.04)
Hematocrit: 31.7 % — ABNORMAL LOW (ref 36.6–50.3)
Hemoglobin: 10.6 g/dL — ABNORMAL LOW (ref 12.1–17.0)
Immature Granulocytes: 0 % (ref 0.0–0.5)
Lymphocytes %: 22 % (ref 12–49)
Lymphocytes Absolute: 1.4 10*3/uL (ref 0.8–3.5)
MCH: 28.5 PG (ref 26.0–34.0)
MCHC: 33.4 g/dL (ref 30.0–36.5)
MCV: 85.2 FL (ref 80.0–99.0)
MPV: 10.6 FL (ref 8.9–12.9)
Monocytes %: 12 % (ref 5–13)
Monocytes Absolute: 0.7 10*3/uL (ref 0.0–1.0)
NRBC Absolute: 0 10*3/uL (ref 0.00–0.01)
Neutrophils %: 62 % (ref 32–75)
Neutrophils Absolute: 3.9 10*3/uL (ref 1.8–8.0)
Nucleated RBCs: 0 PER 100 WBC
Platelets: 289 10*3/uL (ref 150–400)
RBC: 3.72 M/uL — ABNORMAL LOW (ref 4.10–5.70)
RDW: 17.2 % — ABNORMAL HIGH (ref 11.5–14.5)
WBC: 6.3 10*3/uL (ref 4.1–11.1)

## 2019-08-07 LAB — POCT LACTIC ACID: POC Lactic Acid: 1.27 mmol/L (ref 0.40–2.00)

## 2019-08-07 LAB — C-REACTIVE PROTEIN: CRP: <0.29 mg/dL (ref 0.00–0.60)

## 2019-08-07 MED ORDER — SODIUM CHLORIDE 0.9 % IV PIGGY BACK
1 gram | INTRAVENOUS | Status: AC
Start: 2019-08-07 — End: 2019-08-07
  Administered 2019-08-07: 17:00:00 via INTRAVENOUS

## 2019-08-07 MED ORDER — VANCOMYCIN IN 0.9 % SODIUM CHLORIDE 2 GRAM/500 ML IV
2 gram/500 mL | INTRAVENOUS | Status: AC
Start: 2019-08-07 — End: 2019-08-07
  Administered 2019-08-07: 18:00:00 via INTRAVENOUS

## 2019-08-07 MED ORDER — DOXYCYCLINE HYCLATE 100 MG TAB
100 mg | ORAL_TABLET | Freq: Two times a day (BID) | ORAL | 0 refills | Status: AC
Start: 2019-08-07 — End: 2019-08-14

## 2019-08-07 MED ORDER — SODIUM CHLORIDE 0.9% BOLUS IV
0.9 % | Freq: Once | INTRAVENOUS | Status: AC
Start: 2019-08-07 — End: 2019-08-07
  Administered 2019-08-07: 17:00:00 via INTRAVENOUS

## 2019-08-07 MED ORDER — VANCOMYCIN 10 GRAM IV SOLR
10 gram | INTRAVENOUS | Status: DC
Start: 2019-08-07 — End: 2019-08-07
  Administered 2019-08-07: 17:00:00 via INTRAVENOUS

## 2019-08-07 MED ORDER — LORAZEPAM 2 MG/ML IJ SOLN
2 mg/mL | INTRAMUSCULAR | Status: DC
Start: 2019-08-07 — End: 2019-08-07

## 2019-08-07 MED FILL — VANCOMYCIN IN 0.9 % SODIUM CHLORIDE 2 GRAM/500 ML IV: 2 gram/500 mL | INTRAVENOUS | Qty: 500

## 2019-08-07 MED FILL — CEFTRIAXONE 1 GRAM SOLUTION FOR INJECTION: 1 gram | INTRAMUSCULAR | Qty: 1

## 2019-08-07 MED FILL — SODIUM CHLORIDE 0.9 % IV: INTRAVENOUS | Qty: 1000

## 2019-08-07 NOTE — ED Notes (Signed)
Pt ambulatory to restroom with steady gait
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
## 2019-08-07 NOTE — ED Notes (Signed)
 Pt received from triage with c/o right foot pain. 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 Pt reports he has had this ulcer for a while and was told to come to the ER if it started to become painful. There is trace swelling noted and has good PMS of extremity. Pt denies any fevers at home or any other s/s. He is A&Ox4 and AV paced on cardiac monitor.

## 2019-08-09 LAB — EKG, 12 LEAD, INITIAL
Atrial Rate: 65 {beats}/min
Calculated P Axis: 53 degrees
Calculated R Axis: -99 degrees
Calculated T Axis: 73 degrees
P-R Interval: 162 ms
Q-T Interval: 466 ms
QRS Duration: 138 ms
QTC Calculation (Bezet): 484 ms
Ventricular Rate: 65 {beats}/min

## 2019-08-09 LAB — EKG 12-LEAD
Atrial Rate: 65 {beats}/min
P Axis: 53 degrees
P-R Interval: 162 ms
Q-T Interval: 466 ms
QRS Duration: 138 ms
QTc Calculation (Bazett): 484 ms
R Axis: -99 degrees
T Axis: 73 degrees
Ventricular Rate: 65 {beats}/min

## 2019-08-20 ENCOUNTER — Emergency Department: Admit: 2019-08-20 | Payer: MEDICARE | Primary: Adult Health

## 2019-08-20 ENCOUNTER — Inpatient Hospital Stay
Admit: 2019-08-20 | Discharge: 2019-08-20 | Disposition: A | Discharge status: Home / Self Care | Payer: MEDICARE | Attending: Emergency Medicine

## 2019-08-20 DIAGNOSIS — E11621 Type 2 diabetes mellitus with foot ulcer: Secondary | ICD-10-CM

## 2019-08-20 LAB — METABOLIC PANEL, COMPREHENSIVE
A-G Ratio: 0.7 — ABNORMAL LOW (ref 1.1–2.2)
ALT (SGPT): 16 U/L (ref 12–78)
AST (SGOT): 26 U/L (ref 15–37)
Albumin: 4 g/dL (ref 3.5–5.0)
Alk. phosphatase: 104 U/L (ref 45–117)
Anion gap: 3 mmol/L — ABNORMAL LOW (ref 5–15)
BUN/Creatinine ratio: 11 — ABNORMAL LOW (ref 12–20)
BUN: 17 MG/DL (ref 6–20)
Bilirubin, total: 0.3 MG/DL (ref 0.2–1.0)
CO2: 28 mmol/L (ref 21–32)
Calcium: 9.6 MG/DL (ref 8.5–10.1)
Chloride: 108 mmol/L (ref 97–108)
Creatinine: 1.54 MG/DL — ABNORMAL HIGH (ref 0.70–1.30)
GFR est AA: 53 mL/min/{1.73_m2} — ABNORMAL LOW (ref >60)
GFR est non-AA: 43 mL/min/{1.73_m2} — ABNORMAL LOW (ref >60)
Globulin: 5.4 g/dL — ABNORMAL HIGH (ref 2.0–4.0)
Glucose: 95 mg/dL (ref 65–100)
Potassium: 4.6 mmol/L (ref 3.5–5.1)
Protein, total: 9.4 g/dL — ABNORMAL HIGH (ref 6.4–8.2)
Sodium: 139 mmol/L (ref 136–145)

## 2019-08-20 LAB — CBC WITH AUTOMATED DIFF
ABS. BASOPHILS: 0 10*3/uL (ref 0.0–0.1)
ABS. EOSINOPHILS: 0.3 10*3/uL (ref 0.0–0.4)
ABS. IMM. GRANS.: 0 10*3/uL (ref 0.00–0.04)
ABS. LYMPHOCYTES: 1.3 10*3/uL (ref 0.8–3.5)
ABS. MONOCYTES: 0.8 10*3/uL (ref 0.0–1.0)
ABS. NEUTROPHILS: 4.6 10*3/uL (ref 1.8–8.0)
ABSOLUTE NRBC: 0 10*3/uL (ref 0.00–0.01)
BASOPHILS: 1 % (ref 0–1)
EOSINOPHILS: 4 % (ref 0–7)
HCT: 33.3 % — ABNORMAL LOW (ref 36.6–50.3)
HGB: 11 g/dL — ABNORMAL LOW (ref 12.1–17.0)
IMMATURE GRANULOCYTES: 0 % (ref 0.0–0.5)
LYMPHOCYTES: 18 % (ref 12–49)
MCH: 28.1 PG (ref 26.0–34.0)
MCHC: 33 g/dL (ref 30.0–36.5)
MCV: 84.9 FL (ref 80.0–99.0)
MONOCYTES: 11 % (ref 5–13)
MPV: 10.6 FL (ref 8.9–12.9)
NEUTROPHILS: 66 % (ref 32–75)
NRBC: 0 PER 100 WBC
PLATELET: 411 10*3/uL — ABNORMAL HIGH (ref 150–400)
RBC: 3.92 M/uL — ABNORMAL LOW (ref 4.10–5.70)
RDW: 17 % — ABNORMAL HIGH (ref 11.5–14.5)
WBC: 6.9 10*3/uL (ref 4.1–11.1)

## 2019-08-20 LAB — SED RATE (ESR): Sed rate, automated: 78 mm/hr — ABNORMAL HIGH (ref 0–20)

## 2019-08-20 LAB — CBC WITH AUTO DIFFERENTIAL
Basophils %: 1 % (ref 0–1)
Basophils Absolute: 0 10*3/uL (ref 0.0–0.1)
Eosinophils %: 4 % (ref 0–7)
Eosinophils Absolute: 0.3 10*3/uL (ref 0.0–0.4)
Granulocyte Absolute Count: 0 10*3/uL (ref 0.00–0.04)
Hematocrit: 33.3 % — ABNORMAL LOW (ref 36.6–50.3)
Hemoglobin: 11 g/dL — ABNORMAL LOW (ref 12.1–17.0)
Immature Granulocytes: 0 % (ref 0.0–0.5)
Lymphocytes %: 18 % (ref 12–49)
Lymphocytes Absolute: 1.3 10*3/uL (ref 0.8–3.5)
MCH: 28.1 PG (ref 26.0–34.0)
MCHC: 33 g/dL (ref 30.0–36.5)
MCV: 84.9 FL (ref 80.0–99.0)
MPV: 10.6 FL (ref 8.9–12.9)
Monocytes %: 11 % (ref 5–13)
Monocytes Absolute: 0.8 10*3/uL (ref 0.0–1.0)
NRBC Absolute: 0 10*3/uL (ref 0.00–0.01)
Neutrophils %: 66 % (ref 32–75)
Neutrophils Absolute: 4.6 10*3/uL (ref 1.8–8.0)
Nucleated RBCs: 0 PER 100 WBC
Platelets: 411 10*3/uL — ABNORMAL HIGH (ref 150–400)
RBC: 3.92 M/uL — ABNORMAL LOW (ref 4.10–5.70)
RDW: 17 % — ABNORMAL HIGH (ref 11.5–14.5)
WBC: 6.9 10*3/uL (ref 4.1–11.1)

## 2019-08-20 LAB — COMPREHENSIVE METABOLIC PANEL
ALT: 16 U/L (ref 12–78)
AST: 26 U/L (ref 15–37)
Albumin/Globulin Ratio: 0.7 — ABNORMAL LOW (ref 1.1–2.2)
Albumin: 4 g/dL (ref 3.5–5.0)
Alkaline Phosphatase: 104 U/L (ref 45–117)
Anion Gap: 3 mmol/L — ABNORMAL LOW (ref 5–15)
BUN: 17 MG/DL (ref 6–20)
Bun/Cre Ratio: 11 — ABNORMAL LOW (ref 12–20)
CO2: 28 mmol/L (ref 21–32)
Calcium: 9.6 MG/DL (ref 8.5–10.1)
Chloride: 108 mmol/L (ref 97–108)
Creatinine: 1.54 MG/DL — ABNORMAL HIGH (ref 0.70–1.30)
EGFR IF NonAfrican American: 43 mL/min/{1.73_m2} — ABNORMAL LOW (ref >60)
GFR African American: 53 mL/min/{1.73_m2} — ABNORMAL LOW (ref >60)
Globulin: 5.4 g/dL — ABNORMAL HIGH (ref 2.0–4.0)
Glucose: 95 mg/dL (ref 65–100)
Potassium: 4.6 mmol/L (ref 3.5–5.1)
Sodium: 139 mmol/L (ref 136–145)
Total Bilirubin: 0.3 MG/DL (ref 0.2–1.0)
Total Protein: 9.4 g/dL — ABNORMAL HIGH (ref 6.4–8.2)

## 2019-08-20 LAB — SEDIMENTATION RATE: Sed Rate: 78 mm/hr — ABNORMAL HIGH (ref 0–20)

## 2019-08-20 MED ORDER — DOXYCYCLINE HYCLATE 100 MG TAB
100 mg | ORAL_TABLET | Freq: Two times a day (BID) | ORAL | 0 refills | Status: AC
Start: 2019-08-20 — End: 2019-08-27

## 2019-08-20 MED ORDER — ACETAMINOPHEN 325 MG TABLET
325 mg | ORAL_TABLET | Freq: Four times a day (QID) | ORAL | 0 refills | Status: DC | PRN
Start: 2019-08-20 — End: 2019-09-16

## 2019-08-20 MED ORDER — AMOXICILLIN CLAVULANATE 875 MG-125 MG TAB
875-125 mg | ORAL_TABLET | Freq: Two times a day (BID) | ORAL | 0 refills | Status: DC
Start: 2019-08-20 — End: 2019-09-16

## 2019-08-20 MED ORDER — BACITRACIN 500 UNIT/G OINTMENT
500 unit/gram | Freq: Three times a day (TID) | CUTANEOUS | 0 refills | Status: AC
Start: 2019-08-20 — End: 2019-08-30

## 2019-08-20 NOTE — ED Notes (Signed)
1920: Spoke to patient's granddaughter in regards to patient discharge. Granddaughter to set up [place] ride home.
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
## 2019-08-20 NOTE — ED Notes (Signed)
1842: Assumed care of patient, DR at bedside    1849: X- Ray at bedside    1930: I have reviewed discharge instructions with the patient.  The patient verbalized understanding. No questions at this time

## 2019-08-21 LAB — CRP, HIGH SENSITIVITY
CRP High Sensitivity: 1.2 mg/L
CRP, High sensitivity: 1.2 mg/L

## 2019-09-10 ENCOUNTER — Emergency Department: Admit: 2019-09-10 | Payer: MEDICARE | Primary: Adult Health

## 2019-09-10 ENCOUNTER — Inpatient Hospital Stay
Admit: 2019-09-10 | Discharge: 2019-09-10 | Disposition: A | Discharge status: Home / Self Care | Payer: MEDICARE | Attending: Emergency Medicine

## 2019-09-10 DIAGNOSIS — E11621 Type 2 diabetes mellitus with foot ulcer: Secondary | ICD-10-CM

## 2019-09-10 MED ORDER — DOXYCYCLINE HYCLATE 100 MG TAB
100 mg | ORAL_TABLET | Freq: Two times a day (BID) | ORAL | 0 refills | Status: AC
Start: 2019-09-10 — End: 2019-09-17

## 2019-09-10 MED ORDER — ACETAMINOPHEN 325 MG TABLET
325 mg | ORAL_TABLET | ORAL | 0 refills | Status: AC | PRN
Start: 2019-09-10 — End: ?
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
## 2019-09-10 NOTE — ED Notes (Signed)
Non adherent dry dressing applied to wound on bottom of R foot.

## 2019-09-10 NOTE — ED Notes (Signed)
Discharge instructions and Rx reviewed with and given to pt.  Questions answered.  Spoke with pt's granddaughter, Heloise Ochoa, re discharge instructions and Rx.  She states to this RN that she will arrange transportation home for pt and will call him on his cell phone to let him know the plan.  Pt informed of this, cell phone in his hand.  Pt escorted ambulatory with cane to WR by ER RN.

## 2019-09-15 ENCOUNTER — Inpatient Hospital Stay
Admit: 2019-09-15 | Discharge: 2019-09-15 | Disposition: A | Discharge status: Home / Self Care | Payer: MEDICARE | Attending: Emergency Medical Services

## 2019-09-15 DIAGNOSIS — M79671 Pain in right foot: Secondary | ICD-10-CM

## 2019-09-15 MED ORDER — ACETAMINOPHEN 500 MG TAB
500 mg | ORAL | Status: AC
Start: 2019-09-15 — End: 2019-09-15
  Administered 2019-09-15: 12:00:00 via ORAL

## 2019-09-15 MED FILL — ACETAMINOPHEN 500 MG TAB: 500 mg | ORAL | Qty: 2

## 2019-09-15 NOTE — ED Notes (Signed)
Assumed care of pt via EMS stretcher.  Pt is A&O x 4 and reports CC of bilateral foot pain x 2 weeks. Pt has a wound on the bottom of his right foot. Pt ambulatory from the ems stretcher to the bed with his cane. Pt rates pain at this time 9 out of 10. Dr. Mitchell Heir evaluating pt at this time. Pt resting on stretcher in POC with call bell in reach.  Pt placed on monitor x 2. VSS at this time.       1308: Wound care done by this RN on pt right foot at this time.     0800: Medicated pt per orders.     62: Spoke with pt brother's wife. They will come and pick pt up. Pt will wait out in the waiting room.     6578: Pt discharged by Dr. Mitchell Heir. Pt provided with discharge instructions Rx and instructions on follow up care. Pt out of ED via wheelchair accompanied by ED tech Adam to the waiting room.
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
## 2019-09-16 ENCOUNTER — Emergency Department: Admit: 2019-09-16 | Payer: MEDICARE | Primary: Adult Health

## 2019-09-16 ENCOUNTER — Inpatient Hospital Stay
Admit: 2019-09-16 | Discharge: 2019-09-16 | Disposition: A | Discharge status: Home / Self Care | Payer: MEDICARE | Attending: Emergency Medicine

## 2019-09-16 DIAGNOSIS — E11621 Type 2 diabetes mellitus with foot ulcer: Secondary | ICD-10-CM

## 2019-09-16 LAB — CBC WITH AUTOMATED DIFF
ABS. BASOPHILS: 0 10*3/uL (ref 0.0–0.1)
ABS. EOSINOPHILS: 0.3 10*3/uL (ref 0.0–0.4)
ABS. IMM. GRANS.: 0 10*3/uL (ref 0.00–0.04)
ABS. LYMPHOCYTES: 1 10*3/uL (ref 0.8–3.5)
ABS. MONOCYTES: 0.6 10*3/uL (ref 0.0–1.0)
ABS. NEUTROPHILS: 4.4 10*3/uL (ref 1.8–8.0)
ABSOLUTE NRBC: 0 10*3/uL (ref 0.00–0.01)
BASOPHILS: 1 % (ref 0–1)
EOSINOPHILS: 4 % (ref 0–7)
HCT: 31.9 % — ABNORMAL LOW (ref 36.6–50.3)
HGB: 10.5 g/dL — ABNORMAL LOW (ref 12.1–17.0)
IMMATURE GRANULOCYTES: 0 % (ref 0.0–0.5)
LYMPHOCYTES: 16 % (ref 12–49)
MCH: 27.3 PG (ref 26.0–34.0)
MCHC: 32.9 g/dL (ref 30.0–36.5)
MCV: 82.9 FL (ref 80.0–99.0)
MONOCYTES: 9 % (ref 5–13)
MPV: 10.6 FL (ref 8.9–12.9)
NEUTROPHILS: 70 % (ref 32–75)
NRBC: 0 PER 100 WBC
PLATELET: 321 10*3/uL (ref 150–400)
RBC: 3.85 M/uL — ABNORMAL LOW (ref 4.10–5.70)
RDW: 16.4 % — ABNORMAL HIGH (ref 11.5–14.5)
WBC: 6.3 10*3/uL (ref 4.1–11.1)

## 2019-09-16 LAB — METABOLIC PANEL, COMPREHENSIVE
A-G Ratio: 0.6 — ABNORMAL LOW (ref 1.1–2.2)
ALT (SGPT): 26 U/L (ref 12–78)
AST (SGOT): 28 U/L (ref 15–37)
Albumin: 3.4 g/dL — ABNORMAL LOW (ref 3.5–5.0)
Alk. phosphatase: 108 U/L (ref 45–117)
Anion gap: 8 mmol/L (ref 5–15)
BUN/Creatinine ratio: 13 (ref 12–20)
BUN: 17 MG/DL (ref 6–20)
Bilirubin, total: 0.4 MG/DL (ref 0.2–1.0)
CO2: 19 mmol/L — ABNORMAL LOW (ref 21–32)
Calcium: 9.4 MG/DL (ref 8.5–10.1)
Chloride: 114 mmol/L — ABNORMAL HIGH (ref 97–108)
Creatinine: 1.32 MG/DL — ABNORMAL HIGH (ref 0.70–1.30)
GFR est AA: >60 mL/min/{1.73_m2} (ref >60)
GFR est non-AA: 52 mL/min/{1.73_m2} — ABNORMAL LOW (ref >60)
Globulin: 6 g/dL — ABNORMAL HIGH (ref 2.0–4.0)
Glucose: 97 mg/dL (ref 65–100)
Potassium: 4.2 mmol/L (ref 3.5–5.1)
Protein, total: 9.4 g/dL — ABNORMAL HIGH (ref 6.4–8.2)
Sodium: 141 mmol/L (ref 136–145)

## 2019-09-16 LAB — CBC WITH AUTO DIFFERENTIAL
Basophils %: 1 % (ref 0–1)
Basophils Absolute: 0 10*3/uL (ref 0.0–0.1)
Eosinophils %: 4 % (ref 0–7)
Eosinophils Absolute: 0.3 10*3/uL (ref 0.0–0.4)
Granulocyte Absolute Count: 0 10*3/uL (ref 0.00–0.04)
Hematocrit: 31.9 % — ABNORMAL LOW (ref 36.6–50.3)
Hemoglobin: 10.5 g/dL — ABNORMAL LOW (ref 12.1–17.0)
Immature Granulocytes: 0 % (ref 0.0–0.5)
Lymphocytes %: 16 % (ref 12–49)
Lymphocytes Absolute: 1 10*3/uL (ref 0.8–3.5)
MCH: 27.3 PG (ref 26.0–34.0)
MCHC: 32.9 g/dL (ref 30.0–36.5)
MCV: 82.9 FL (ref 80.0–99.0)
MPV: 10.6 FL (ref 8.9–12.9)
Monocytes %: 9 % (ref 5–13)
Monocytes Absolute: 0.6 10*3/uL (ref 0.0–1.0)
NRBC Absolute: 0 10*3/uL (ref 0.00–0.01)
Neutrophils %: 70 % (ref 32–75)
Neutrophils Absolute: 4.4 10*3/uL (ref 1.8–8.0)
Nucleated RBCs: 0 PER 100 WBC
Platelets: 321 10*3/uL (ref 150–400)
RBC: 3.85 M/uL — ABNORMAL LOW (ref 4.10–5.70)
RDW: 16.4 % — ABNORMAL HIGH (ref 11.5–14.5)
WBC: 6.3 10*3/uL (ref 4.1–11.1)

## 2019-09-16 LAB — COMPREHENSIVE METABOLIC PANEL
ALT: 26 U/L (ref 12–78)
AST: 28 U/L (ref 15–37)
Albumin/Globulin Ratio: 0.6 — ABNORMAL LOW (ref 1.1–2.2)
Albumin: 3.4 g/dL — ABNORMAL LOW (ref 3.5–5.0)
Alkaline Phosphatase: 108 U/L (ref 45–117)
Anion Gap: 8 mmol/L (ref 5–15)
BUN: 17 MG/DL (ref 6–20)
Bun/Cre Ratio: 13 (ref 12–20)
CO2: 19 mmol/L — ABNORMAL LOW (ref 21–32)
Calcium: 9.4 MG/DL (ref 8.5–10.1)
Chloride: 114 mmol/L — ABNORMAL HIGH (ref 97–108)
Creatinine: 1.32 MG/DL — ABNORMAL HIGH (ref 0.70–1.30)
EGFR IF NonAfrican American: 52 mL/min/{1.73_m2} — ABNORMAL LOW (ref >60)
GFR African American: >60 mL/min/{1.73_m2} (ref >60)
Globulin: 6 g/dL — ABNORMAL HIGH (ref 2.0–4.0)
Glucose: 97 mg/dL (ref 65–100)
Potassium: 4.2 mmol/L (ref 3.5–5.1)
Sodium: 141 mmol/L (ref 136–145)
Total Bilirubin: 0.4 MG/DL (ref 0.2–1.0)
Total Protein: 9.4 g/dL — ABNORMAL HIGH (ref 6.4–8.2)

## 2019-09-16 MED ORDER — KETOROLAC TROMETHAMINE 30 MG/ML INJECTION
30 mg/mL (1 mL) | INTRAMUSCULAR | Status: AC
Start: 2019-09-16 — End: 2019-09-16
  Administered 2019-09-16: 14:00:00 via INTRAVENOUS

## 2019-09-16 MED FILL — KETOROLAC TROMETHAMINE 30 MG/ML INJECTION: 30 mg/mL (1 mL) | INTRAMUSCULAR | Qty: 1

## 2019-09-16 NOTE — ED Notes (Signed)
Pt arrives via squad reporting fever and gen unwellness. EMS reports vs were [facility]DL and Pt walked out of the house to meet them. Pt was discharged from this ED yesterday.

## 2019-09-16 NOTE — Progress Notes (Signed)
14:44PM  Outbound call to patient's daughter Vaughan Basta,  [phone].  Identified self, role, and nature of the call.  Daughter voiced she will try to reach her daughter to get the information for the accepting [facility] agency.  Provided direct contact information.    Caren Hazy, MSW  [phone]      13:42PM Outbound F/U call to grand daughter Elise Benne, @ [phone].  No answer and unable to leave a voice message b/c mailbox is full.  Petra Kuba of the call re: name of [facility] agency.        Caren Hazy, MSW  [phone]      12:17PM Outbound F/U call to patient's grand daughter Elise Benne, @ [phone].  No answer and unable to leave a voice message d/t mailbox being full.  Will attempt to reach again.      Caren Hazy, MSW  [phone]      12:12PM Outbound call to patient's grand daughter Elise Benne, @ [phone].  No answer, and unable to leave a voice message d/t mailbox is full.  Will try to reach again.        Caren Hazy, MSW  [phone]        Met w/patient and informed he's open w/HH and supposed to come out tomorrow.  Patient unable to recall the name of the agency.  Requested Probation officer reach out to his grand daughter, as she may know the name of the [facility] agency.        Caren Hazy, MSW  [phone]
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
## 2019-09-16 NOTE — ED Notes (Signed)
1115: Bedside and Verbal shift change report given to by Lissa (offgoing nurse) to Emma/Maddie Physiological scientist). Report included the following information SBAR, Kardex, ED Summary, [facility] and Recent Results.     1510: Doctor at bedside    1526: I have reviewed discharge instructions with the patient.  The patient verbalized understanding. Patient has no questions at this time. Patients daughter called an Benedetto Goad for his ride home.

## 2019-09-16 NOTE — ED Notes (Signed)
Change of shift reports at bedside with Kara Mead and Maddie RNs; provided pt with a coffee

## 2019-09-16 NOTE — ED Notes (Signed)
Pt in xray

## 2019-09-16 NOTE — ED Notes (Signed)
1500: MD updating patient's granddaughter at this time. Granddaughter to set up Benedetto Goad ride home for patient at this time.

## 2019-10-01 DIAGNOSIS — I951 Orthostatic hypotension: Secondary | ICD-10-CM

## 2019-10-02 ENCOUNTER — Emergency Department: Admit: 2019-10-02 | Payer: MEDICARE | Primary: Adult Health

## 2019-10-02 ENCOUNTER — Inpatient Hospital Stay
Admit: 2019-10-02 | Discharge: 2019-10-07 | Disposition: A | Discharge status: Home / Self Care | Payer: MEDICARE | Attending: Internal Medicine | Admitting: Internal Medicine

## 2019-10-02 LAB — METABOLIC PANEL, COMPREHENSIVE
A-G Ratio: 0.6 — ABNORMAL LOW (ref 1.1–2.2)
ALT (SGPT): 19 U/L (ref 12–78)
AST (SGOT): 24 U/L (ref 15–37)
Albumin: 4.3 g/dL (ref 3.5–5.0)
Alk. phosphatase: 121 U/L — ABNORMAL HIGH (ref 45–117)
Anion gap: 12 mmol/L (ref 5–15)
BUN/Creatinine ratio: 19 (ref 12–20)
BUN: 39 MG/DL — ABNORMAL HIGH (ref 6–20)
Bilirubin, total: 0.7 MG/DL (ref 0.2–1.0)
CO2: 19 mmol/L — ABNORMAL LOW (ref 21–32)
Calcium: 10.7 MG/DL — ABNORMAL HIGH (ref 8.5–10.1)
Chloride: 108 mmol/L (ref 97–108)
Creatinine: 2.08 MG/DL — ABNORMAL HIGH (ref 0.70–1.30)
GFR est AA: 37 mL/min/{1.73_m2} — ABNORMAL LOW (ref >60)
GFR est non-AA: 31 mL/min/{1.73_m2} — ABNORMAL LOW (ref >60)
Globulin: 6.7 g/dL — ABNORMAL HIGH (ref 2.0–4.0)
Glucose: 100 mg/dL (ref 65–100)
Potassium: 4.3 mmol/L (ref 3.5–5.1)
Protein, total: 11 g/dL — ABNORMAL HIGH (ref 6.4–8.2)
Sodium: 139 mmol/L (ref 136–145)

## 2019-10-02 LAB — EKG, 12 LEAD, INITIAL
Atrial Rate: 65 {beats}/min
Calculated P Axis: 68 degrees
Calculated R Axis: 106 degrees
Calculated T Axis: 46 degrees
P-R Interval: 146 ms
Q-T Interval: 468 ms
QRS Duration: 138 ms
QTC Calculation (Bezet): 486 ms
Ventricular Rate: 65 {beats}/min

## 2019-10-02 LAB — TROPONIN I: Troponin-I, Qt.: <0.05 ng/mL (ref <0.05)

## 2019-10-02 LAB — CBC WITH AUTOMATED DIFF
ABS. BASOPHILS: 0 10*3/uL (ref 0.0–0.1)
ABS. EOSINOPHILS: 0.1 10*3/uL (ref 0.0–0.4)
ABS. IMM. GRANS.: 0 10*3/uL (ref 0.00–0.04)
ABS. LYMPHOCYTES: 1.4 10*3/uL (ref 0.8–3.5)
ABS. MONOCYTES: 0.6 10*3/uL (ref 0.0–1.0)
ABS. NEUTROPHILS: 5.8 10*3/uL (ref 1.8–8.0)
ABSOLUTE NRBC: 0 10*3/uL (ref 0.00–0.01)
BASOPHILS: 1 % (ref 0–1)
EOSINOPHILS: 1 % (ref 0–7)
HCT: 39.5 % (ref 36.6–50.3)
HGB: 13.1 g/dL (ref 12.1–17.0)
IMMATURE GRANULOCYTES: 0 % (ref 0.0–0.5)
LYMPHOCYTES: 17 % (ref 12–49)
MCH: 27 PG (ref 26.0–34.0)
MCHC: 33.2 g/dL (ref 30.0–36.5)
MCV: 81.4 FL (ref 80.0–99.0)
MONOCYTES: 8 % (ref 5–13)
MPV: 10.9 FL (ref 8.9–12.9)
NEUTROPHILS: 74 % (ref 32–75)
NRBC: 0 PER 100 WBC
PLATELET: 387 10*3/uL (ref 150–400)
RBC: 4.85 M/uL (ref 4.10–5.70)
RDW: 15.9 % — ABNORMAL HIGH (ref 11.5–14.5)
WBC: 7.9 10*3/uL (ref 4.1–11.1)

## 2019-10-02 LAB — URINALYSIS W/ REFLEX CULTURE
BACTERIA, URINE: NEGATIVE /hpf
Bacteria: NEGATIVE /hpf
Bilirubin, Urine: NEGATIVE
Bilirubin: NEGATIVE
Blood, Urine: NEGATIVE
Blood: NEGATIVE
Glucose, Ur: NEGATIVE mg/dL
Glucose: NEGATIVE mg/dL
Leukocyte Esterase, Urine: NEGATIVE
Leukocyte Esterase: NEGATIVE
Nitrite, Urine: NEGATIVE
Nitrites: NEGATIVE
Protein, UA: NEGATIVE mg/dL
Protein: NEGATIVE mg/dL
Specific Gravity, UA: 1.015 (ref 1.003–1.030)
Specific gravity: 1.015 (ref 1.003–1.030)
Urobilinogen, UA, POCT: 0.2 EU/dL (ref 0.2–1.0)
Urobilinogen: 0.2 EU/dL (ref 0.2–1.0)
pH (UA): 5.5 (ref 5.0–8.0)
pH, UA: 5.5 (ref 5.0–8.0)

## 2019-10-02 LAB — POC LACTIC ACID: Lactic Acid (POC): 1.45 mmol/L (ref 0.40–2.00)

## 2019-10-02 LAB — LIPASE
Lipase: 347 U/L (ref 73–393)
Lipase: 347 U/L (ref 73–393)

## 2019-10-02 LAB — CBC WITH AUTO DIFFERENTIAL
Basophils %: 1 % (ref 0–1)
Basophils Absolute: 0 10*3/uL (ref 0.0–0.1)
Eosinophils %: 1 % (ref 0–7)
Eosinophils Absolute: 0.1 10*3/uL (ref 0.0–0.4)
Granulocyte Absolute Count: 0 10*3/uL (ref 0.00–0.04)
Hematocrit: 39.5 % (ref 36.6–50.3)
Hemoglobin: 13.1 g/dL (ref 12.1–17.0)
Immature Granulocytes: 0 % (ref 0.0–0.5)
Lymphocytes %: 17 % (ref 12–49)
Lymphocytes Absolute: 1.4 10*3/uL (ref 0.8–3.5)
MCH: 27 PG (ref 26.0–34.0)
MCHC: 33.2 g/dL (ref 30.0–36.5)
MCV: 81.4 FL (ref 80.0–99.0)
MPV: 10.9 FL (ref 8.9–12.9)
Monocytes %: 8 % (ref 5–13)
Monocytes Absolute: 0.6 10*3/uL (ref 0.0–1.0)
NRBC Absolute: 0 10*3/uL (ref 0.00–0.01)
Neutrophils %: 74 % (ref 32–75)
Neutrophils Absolute: 5.8 10*3/uL (ref 1.8–8.0)
Nucleated RBCs: 0 PER 100 WBC
Platelets: 387 10*3/uL (ref 150–400)
RBC: 4.85 M/uL (ref 4.10–5.70)
RDW: 15.9 % — ABNORMAL HIGH (ref 11.5–14.5)
WBC: 7.9 10*3/uL (ref 4.1–11.1)

## 2019-10-02 LAB — EKG 12-LEAD
Atrial Rate: 65 {beats}/min
P Axis: 68 degrees
P-R Interval: 146 ms
Q-T Interval: 468 ms
QRS Duration: 138 ms
QTc Calculation (Bazett): 486 ms
R Axis: 106 degrees
T Axis: 46 degrees
Ventricular Rate: 65 {beats}/min

## 2019-10-02 LAB — COMPREHENSIVE METABOLIC PANEL
ALT: 19 U/L (ref 12–78)
AST: 24 U/L (ref 15–37)
Albumin/Globulin Ratio: 0.6 — ABNORMAL LOW (ref 1.1–2.2)
Albumin: 4.3 g/dL (ref 3.5–5.0)
Alkaline Phosphatase: 121 U/L — ABNORMAL HIGH (ref 45–117)
Anion Gap: 12 mmol/L (ref 5–15)
BUN: 39 MG/DL — ABNORMAL HIGH (ref 6–20)
Bun/Cre Ratio: 19 (ref 12–20)
CO2: 19 mmol/L — ABNORMAL LOW (ref 21–32)
Calcium: 10.7 MG/DL — ABNORMAL HIGH (ref 8.5–10.1)
Chloride: 108 mmol/L (ref 97–108)
Creatinine: 2.08 MG/DL — ABNORMAL HIGH (ref 0.70–1.30)
EGFR IF NonAfrican American: 31 mL/min/{1.73_m2} — ABNORMAL LOW (ref >60)
GFR African American: 37 mL/min/{1.73_m2} — ABNORMAL LOW (ref >60)
Globulin: 6.7 g/dL — ABNORMAL HIGH (ref 2.0–4.0)
Glucose: 100 mg/dL (ref 65–100)
Potassium: 4.3 mmol/L (ref 3.5–5.1)
Sodium: 139 mmol/L (ref 136–145)
Total Bilirubin: 0.7 MG/DL (ref 0.2–1.0)
Total Protein: 11 g/dL — ABNORMAL HIGH (ref 6.4–8.2)

## 2019-10-02 LAB — POCT LACTIC ACID: POC Lactic Acid: 1.45 mmol/L (ref 0.40–2.00)

## 2019-10-02 LAB — TROPONIN: Troponin I: <0.05 ng/mL (ref <0.05)

## 2019-10-02 MED ORDER — SENNOSIDES-DOCUSATE SODIUM 8.6 MG-50 MG TAB
Freq: Two times a day (BID) | ORAL | Status: AC
Start: 2019-10-02 — End: 2019-10-04
  Administered 2019-10-02 – 2019-10-04 (×4): via ORAL

## 2019-10-02 MED ORDER — ONDANSETRON 4 MG TAB, RAPID DISSOLVE
4 mg | Freq: Three times a day (TID) | ORAL | Status: DC | PRN
Start: 2019-10-02 — End: 2019-10-07

## 2019-10-02 MED ORDER — ATENOLOL 25 MG TAB
25 mg | Freq: Every day | ORAL | Status: DC
Start: 2019-10-02 — End: 2019-10-05
  Administered 2019-10-03: 13:00:00 via ORAL

## 2019-10-02 MED ORDER — SODIUM CHLORIDE 0.9 % IV
Freq: Once | INTRAVENOUS | Status: AC
Start: 2019-10-02 — End: 2019-10-02
  Administered 2019-10-02: 18:00:00 via INTRAVENOUS

## 2019-10-02 MED ORDER — POLYETHYLENE GLYCOL 3350 100 % ORAL POWDER
17 gram/dose | Freq: Every day | ORAL | 0 refills | Status: AC
Start: 2019-10-02 — End: ?

## 2019-10-02 MED ORDER — PANTOPRAZOLE 40 MG TAB, DELAYED RELEASE
40 mg | Freq: Every day | ORAL | Status: DC
Start: 2019-10-02 — End: 2019-10-07
  Administered 2019-10-03 – 2019-10-07 (×5): via ORAL

## 2019-10-02 MED ORDER — SODIUM CHLORIDE 0.9% BOLUS IV
0.9 % | INTRAVENOUS | Status: AC
Start: 2019-10-02 — End: 2019-10-02
  Administered 2019-10-02: 07:00:00 via INTRAVENOUS

## 2019-10-02 MED ORDER — SODIUM CHLORIDE 0.9 % IJ SYRG
INTRAMUSCULAR | Status: DC | PRN
Start: 2019-10-02 — End: 2019-10-07
  Administered 2019-10-03: 07:00:00 via INTRAVENOUS

## 2019-10-02 MED ORDER — POLYETHYLENE GLYCOL 3350 17 GRAM (100 %) ORAL POWDER PACKET
17 gram | Freq: Every day | ORAL | Status: DC | PRN
Start: 2019-10-02 — End: 2019-10-07
  Administered 2019-10-03 – 2019-10-06 (×2): via ORAL

## 2019-10-02 MED ORDER — ACETAMINOPHEN 325 MG TABLET
325 mg | Freq: Four times a day (QID) | ORAL | Status: DC | PRN
Start: 2019-10-02 — End: 2019-10-07
  Administered 2019-10-02 – 2019-10-05 (×8): via ORAL

## 2019-10-02 MED ORDER — APIXABAN 5 MG TABLET
5 mg | Freq: Two times a day (BID) | ORAL | Status: DC
Start: 2019-10-02 — End: 2019-10-02

## 2019-10-02 MED ORDER — FAMOTIDINE (PF) 20 MG/2 ML IV
20 mg/2 mL | INTRAVENOUS | Status: AC
Start: 2019-10-02 — End: 2019-10-02
  Administered 2019-10-02: 15:00:00 via INTRAVENOUS

## 2019-10-02 MED ORDER — LIDOCAINE 2 % MUCOSAL SOLN
2 % | Freq: Once | Status: AC
Start: 2019-10-02 — End: 2019-10-02

## 2019-10-02 MED ORDER — ONDANSETRON (PF) 4 MG/2 ML INJECTION
4 mg/2 mL | Freq: Four times a day (QID) | INTRAMUSCULAR | Status: DC | PRN
Start: 2019-10-02 — End: 2019-10-07
  Administered 2019-10-03: 09:00:00 via INTRAVENOUS

## 2019-10-02 MED ORDER — SODIUM CHLORIDE 0.9% BOLUS IV
0.9 % | Freq: Once | INTRAVENOUS | Status: AC
Start: 2019-10-02 — End: 2019-10-02
  Administered 2019-10-02: 15:00:00 via INTRAVENOUS

## 2019-10-02 MED ORDER — SODIUM CHLORIDE 0.9 % IJ SYRG
Freq: Three times a day (TID) | INTRAMUSCULAR | Status: DC
Start: 2019-10-02 — End: 2019-10-07
  Administered 2019-10-03 – 2019-10-07 (×11): via INTRAVENOUS

## 2019-10-02 MED FILL — SODIUM CHLORIDE 0.9 % IV: INTRAVENOUS | Qty: 1000

## 2019-10-02 MED FILL — MAG-AL 200 MG-200 MG/5 ML ORAL SUSPENSION: 200-200 mg/5 mL | ORAL | Qty: 30

## 2019-10-02 MED FILL — BD POSIFLUSH NORMAL SALINE 0.9 % INJECTION SYRINGE: INTRAMUSCULAR | Qty: 40

## 2019-10-02 MED FILL — FAMOTIDINE (PF) 20 MG/2 ML IV: 20 mg/2 mL | INTRAVENOUS | Qty: 2

## 2019-10-02 MED FILL — DOK PLUS 8.6 MG-50 MG TABLET: ORAL | Qty: 1

## 2019-10-02 MED FILL — ATENOLOL 25 MG TAB: 25 mg | ORAL | Qty: 1

## 2019-10-02 MED FILL — MAPAP (ACETAMINOPHEN) 325 MG TABLET: 325 mg | ORAL | Qty: 2

## 2019-10-02 NOTE — Progress Notes (Signed)
 11:50 AM  CM has placed referrals for [facility] and [facility] via [facility], referral to [facility] via [employer]. CM talked with patient access who confirm that pt's insurance of [employer] is accurate per chart, and that they do not show pt having Medicaid. Per chart review from previous admission, previous CM had verified that pt is not a caremore pt. CM will send referral to [facility] to screen pt for Medicaid. Anticipating that pt will need LTSS screening completed.     11:29 AM  CM discussed case with ED MD who states that she feels pt requires observation admission. CM met with pt again at bedside to discuss recommendations for SNF. CM provided freedom of choice and pt identifies three facilities where he would like referrals placed to: top choice is [facility], second choice is [facility], third choice is [facility]. FOC form completed and placed on bedside chart. CM to send referrals to the above facilities. Awaiting updated insurance information.       Original note:  CM acknowledges consult. PT/OT consults in place, CM talked with PT/OT following evaluation who state that pt will benefit from SNF. Awaiting completed noted. CM met with pt at bedside. Pt unable to verify address, states that he [place] [address] in 2 level home with 7-8 STE. He says that his granddaughter, Avi, lives with him but is never there, therefore he lives alone. He states that he cannot continue to live alone as he needs more support in the home. Pt voices that he wants to go and live in a nursing home. Pt has previous SNF hx at [facility], [facility], and [facility]. Pt states that he only has a cane (at bedside), no other DME at home. He reports that on his bad days when he is feeling bad, he is able to ambulate and crawls to bathroom, etc. He has a hard time ambulating up and down stairs in home. Pt states that he manages his medications at home. He does not have other family nearby, states his granddaughter  is his only support. Granddaughter transports to appts. Pt did not give details on how often his granddaughter is with him, just states that she is never there. Anticipating need for SNF based on PT/OT consults. Awaiting updated insurance information from patient access as pt reports he has [employer]. Pt will need insurance authorization to transition to SNF. Also requested them to see if pt has Medicaid. CM to follow up with pt.    Lavanda Plum, CLAUDIO  Care Manager, [facility]  [phone]

## 2019-10-02 NOTE — ED Notes (Signed)
 TRANSFER - IN REPORT:    Verbal and bedside report received from [place] RN (name) on [employer].  Report consisted of patient's Situation, Background, Assessment and   Recommendations(SBAR).   Information from the following report(s) SBAR and ED Summary was reviewed with the receiving nurse.  Opportunity for questions and clarification was provided.      1004: Left a voicemail for rehab services in regard to PT and OT evaluation.     1109: Medicated pt per orders.     1124: Case management at bedside.     1454: Patient is being transferred to [facility] Medical Oncology, Room # 1139.  Report given to Norleen, RN on [employer] for routine progression of care.  Report consisted of the following information SBAR, ED Summary and Recent Results.  Patient transferred to receiving unit by: transport (RN or tech name).    Outstanding consults needed: No     Next labs due: No     The following personal items will be sent with the patient during transfer to the floor:    All valuables:    Cardiac monitoring ordered: Yes    The following CURRENT information was reported to the receiving RN:    Code status: Full Code at time of transfer    Last set of vital signs:  Vital Signs  Level of Consciousness: Alert (0) (10/02/19 1115)  Temp: 97.5 F (36.4 C) (10/02/19 1115)  Temp Source: Oral (10/02/19 1115)  Pulse (Heart Rate): 70 (10/02/19 1115)  Heart Rate Source: Monitor (10/02/19 1115)  Resp Rate: 16 (10/02/19 1115)  BP: (!) 145/91 (10/02/19 1115)  MAP (Monitor): 107 (10/02/19 1115)  MAP (Calculated): 109 (10/02/19 1115)  BP 1 Location: Right upper arm (10/02/19 1039)  BP 1 Method: Automatic (10/02/19 1039)  BP Patient Position: Supine (10/02/19 1039)  MEWS Score: 1 (10/02/19 1115)         Oxygen Therapy  O2 Sat (%): 98 % (10/02/19 1115)  Pulse via Oximetry: 85 beats per minute (10/02/19 1115)  O2 Device: None (10/02/19 0554)      Last pain assessment:  Pain 1  Pain Scale 1: Numeric (0 - 10)  Pain Intensity 1:  7  Patient Stated Pain Goal: 0  Pain Onset 1: Pain has been ongoing  Pain Location 1: Abdomen, Leg, Foot, Shoulder (Bilateral)  Pain Description 1: Sore      Wounds: Yes    Urinary catheter: voiding  Is there a foley order: No     LDAs:       Peripheral IV 10/02/19 Left Forearm (Active)   Site Assessment Clean, dry, & intact 10/02/19 0025   Phlebitis Assessment 0 10/02/19 0025   Infiltration Assessment 0 10/02/19 0025   Dressing Status Clean, dry, & intact 10/02/19 0025   Dressing Type Transparent 10/02/19 0025         Opportunity for questions and clarification was provided.    Leeroy KATHEE Bun

## 2019-10-02 NOTE — Progress Notes (Signed)
Vitals:    10/02/19 1030 10/02/19 1034 10/02/19 1036 10/02/19 1039   BP: (!) 132/94 133/77 108/70 (!) 160/90   BP 1 Location: Right upper arm Right upper arm Right upper arm Right upper arm   BP Patient Position: Supine;At rest Sitting Standing Supine   Pulse:       Temp:       Resp:       Height:       Weight:       SpO2:         Drue Novel, OTR/L
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
## 2019-10-02 NOTE — Progress Notes (Signed)
End of Shift Note    Bedside shift change report given to Melody (oncoming nurse) by Otilio Connors, RN (offgoing nurse).  Report included the following information SBAR, Kardex and ED Summary    Shift worked:  7a-7p     Shift summary and any significant changes:     pt doing fine     Concerns for physician to address:  none     Zone phone for oncoming shift:   [phone]       Activity:     Number times ambulated in hallways past shift: 0  Number of times OOB to chair past shift: 0    Cardiac:   Cardiac Monitoring: Yes           Access:   Current line(s): PIV     Genitourinary:   Urinary status: voiding    Respiratory:   O2 Device: None  Chronic home O2 use?: YES  Incentive spirometer at bedside: YES     GI:  Last Bowel Movement Date: 10/02/19  Current diet:  ADULT DIET Regular  Passing flatus: NO  Tolerating current diet: YES       Pain Management:   Patient states pain is manageable on current regimen: YES    Skin:  Braden Score: 15  Interventions: increase time out of bed    Patient Safety:  Fall Score: Total Score: 2  Interventions: gripper socks and pt to call before getting OOB       Length of Stay:  Expected LOS: 2d 4h  Actual LOS: 0      Otilio Connors, RN

## 2019-10-02 NOTE — ED Notes (Signed)
Report given to Ellie, RN. They were informed of patient chief complaint, current status, orders completed (to include IV access/medications/radiology testing), outstanding orders that still need to be completed, and the treatment plan. Ensured no questions or concerns regarding the patient prior to departure.

## 2019-10-02 NOTE — ED Notes (Signed)
Patient brought back from CT via stretcher at this time.

## 2019-10-02 NOTE — ED Notes (Signed)
Patient brought back from radiolgoy via stretcher at this time.

## 2019-10-02 NOTE — Progress Notes (Signed)
 Transition of Care Plan:    RUR: Not listed on chart at this time   Disposition: Anticipating SNF, referrals pending for [facility], [facility], and [facility]   COVID test: Pt has not been vaccinated, will need rapid COVID test resulted prior to d/c  Follow up appointments: PCP  DME needed: Pt has a cane.   Transportation at Discharge: Anticipating need for BLS   Keys or means to access home: Pending disposition       IM Medicare Letter: Will need 2nd IMM Letter   Is patient a BCPI-A Bundle: N/A                                                                            If yes, was Bundle Letter given?: N/A  Caregiver Contact: Pt's daughter, Rock Titus squibb) [phone]  Discharge Caregiver contacted prior to discharge?  Unit CM to follow.    Reason for Admission:  Acute kidney injury likely prerenal, CKD stage 3, orthostatis hypotension due to poor oral intake, constipation, chronic right foot ulcer, etc.                     RUR Score: Not listed on chart at this time                     Plan for utilizing home health:  No plans at this time, anticipating need for SNF at d/c        PCP: First and Last name:  Tommas Mariel BRAVO, NP     Name of Practice:    Are you a current patient: Yes/No: Yes   Approximate date of last visit: Uncertain   Can you participate in a virtual visit with your PCP: No                    Current Advanced Directive/Advance Care Plan: Full Code   Advance Care Planning     General Advance Care Planning (ACP) Conversation      Date of Conversation:10/02/2019  Conducted with: Patient with Decision Making Capacity    Healthcare Decision Maker:     Primary Decision Maker: Titus Rock - Daughter - [phone]    Today we documented Decision Maker(s) consistent with Legal Next of Kin hierarchy.    Content/Action Overview:   DECLINED ACP conversation - will revisit periodically   Reviewed DNR/DNI and patient elects Full Code (Attempt Resuscitation)    Length of Voluntary ACP Conversation in  minutes:  <16 minutes (Non-Billable)    Database administrator             Healthcare Decision Maker: TY - children      Primary Decision Maker: Gouveia,Linda - Daughter - [phone]                  Transition of Care Plan:   Anticipating d/c to SNF    Please see previous CM note. CM reviewed chart. CM completed assessment with pt at bedside. CM introduced self/role, verified demographics, and discussed discharge planning. Pending referrals placed for SNF, please follow up. Pt[facility] top choice is [facility], CM received call from Naomie in admissions at [employer] p) [phone] inquiring about pt's  status. Pt is unvaccinated and will need a rapid COVID test prior to d/c. Please f/u with Naomie to further discuss when auth needs to be initiated. CM has sent request to [employer] for Medicaid screening for pt.    Unit care management will continue to follow for transition of care planning needs.                       Care Management Interventions  PCP Verified by CM: Yes  Palliative Care Criteria Met (RRAT>21 & CHF Dx)?: No  Mode of Transport at Discharge: Other (see comment) (Anticipating need for BLS transport )  Transition of Care Consult (CM Consult): Discharge Planning  Discharge Durable Medical Equipment: No (Pt has a cane)  Physical Therapy Consult: Yes  Occupational Therapy Consult: Yes  Speech Therapy Consult: No  Current Support Network: Relative's Home (Pt lives with his granddaughter, who he reports is never home ; very limited support reported in community)  Confirm Follow Up Transport: Family (Pt's granddaughter)  Freedom of Choice List was Provided with Basic Dialogue that Supports the Patient's Individualized Plan of Care/Goals, Treatment Preferences and Shares the Quality Data Associated with the Providers?: Yes  Discharge Location  Discharge Placement: Skilled nursing facility (Anticipating SNF; referrals pending)    Lavanda Romeo FLORENCE  Care Manager, [facility]  [phone]

## 2019-10-02 NOTE — ED Notes (Signed)
Patient transported to CT via stretcher at this time.
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
## 2019-10-02 NOTE — ED Notes (Signed)
Walked urine specimens down to the lab at this time.

## 2019-10-02 NOTE — ED Notes (Signed)
Patient has concern about going back home due to not being able to do their ADL's on their own
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
## 2019-10-02 NOTE — Progress Notes (Signed)
Received notification from bedside RN about patient with regards to: patient restless secondary to foot pain, recently given Tylenol. Reports no relief of discomfort. Would benefit form sleeping aid  VS: BP 145/75, HR 63, RR 18, O2 sat 100% on RA    Intervention given: Tylenol PM q hs, Tramadol PO x 1 dose ordered

## 2019-10-02 NOTE — Progress Notes (Signed)
Pharmacy Medication Reconciliation     The patient was interviewed regarding current PTA medication list, use and drug allergies;  patient present in room and obtained permission from patient to discuss drug regimen with visitor(s) present. The patient was questioned regarding use of any other inhalers, topical products, over the counter medications, herbal medications, vitamin products or ophthalmic/nasal/otic medication use.     Allergy Update: Hydrocodone, Morphine, and Oxycodone    Recommendations/Findings:   The following amendments were made to the patient's active medication list on file at [facility]:   1) Additions:   +pantoprazole  +atorvastatin  2) Deletions:   -apixaban  -bisoprolol  -furosemide  -lisinopril  -omeprazole    3) Changes: none  Pertinent Findings:   -patient denies taking any BP meds, diuretics, or blood thinners    Clarified PTA med list with patient interview, rx query, and [place] query. PTA medication list was corrected to the following:     Prior to Admission Medications   Prescriptions Last Dose Informant Taking?   acetaminophen (TYLENOL) 325 mg tablet  Self Yes   Sig: Take 2 Tablets by mouth every four (4) hours as needed for Pain.   atorvastatin (LIPITOR) 40 mg tablet 10/02/2019 at Unknown time Self Yes   Sig: Take 40 mg by mouth daily.   pantoprazole (PROTONIX) 40 mg tablet  Self Yes   Sig: Take 40 mg by mouth two (2) times a day.      Facility-Administered Medications: None        Thank you,  Silver Huguenin, [facility]

## 2019-10-03 LAB — METABOLIC PANEL, COMPREHENSIVE
A-G Ratio: 0.6 — ABNORMAL LOW (ref 1.1–2.2)
ALT (SGPT): 43 U/L (ref 12–78)
AST (SGOT): 70 U/L — ABNORMAL HIGH (ref 15–37)
Albumin: 3.3 g/dL — ABNORMAL LOW (ref 3.5–5.0)
Alk. phosphatase: 95 U/L (ref 45–117)
Anion gap: 6 mmol/L (ref 5–15)
BUN/Creatinine ratio: 16 (ref 12–20)
BUN: 29 MG/DL — ABNORMAL HIGH (ref 6–20)
Bilirubin, total: 0.6 MG/DL (ref 0.2–1.0)
CO2: 19 mmol/L — ABNORMAL LOW (ref 21–32)
Calcium: 9.1 MG/DL (ref 8.5–10.1)
Chloride: 112 mmol/L — ABNORMAL HIGH (ref 97–108)
Creatinine: 1.84 MG/DL — ABNORMAL HIGH (ref 0.70–1.30)
GFR est AA: 43 mL/min/{1.73_m2} — ABNORMAL LOW (ref >60)
GFR est non-AA: 35 mL/min/{1.73_m2} — ABNORMAL LOW (ref >60)
Globulin: 5.1 g/dL — ABNORMAL HIGH (ref 2.0–4.0)
Glucose: 96 mg/dL (ref 65–100)
Potassium: 3.9 mmol/L (ref 3.5–5.1)
Protein, total: 8.4 g/dL — ABNORMAL HIGH (ref 6.4–8.2)
Sodium: 137 mmol/L (ref 136–145)

## 2019-10-03 LAB — CBC WITH AUTOMATED DIFF
ABS. BASOPHILS: 0 10*3/uL (ref 0.0–0.1)
ABS. EOSINOPHILS: 0 10*3/uL (ref 0.0–0.4)
ABS. IMM. GRANS.: 0.1 10*3/uL — ABNORMAL HIGH (ref 0.00–0.04)
ABS. LYMPHOCYTES: 0.9 10*3/uL (ref 0.8–3.5)
ABS. MONOCYTES: 1.4 10*3/uL — ABNORMAL HIGH (ref 0.0–1.0)
ABS. NEUTROPHILS: 12.9 10*3/uL — ABNORMAL HIGH (ref 1.8–8.0)
ABSOLUTE NRBC: 0 10*3/uL (ref 0.00–0.01)
BASOPHILS: 0 % (ref 0–1)
EOSINOPHILS: 0 % (ref 0–7)
HCT: 32.6 % — ABNORMAL LOW (ref 36.6–50.3)
HGB: 10.5 g/dL — ABNORMAL LOW (ref 12.1–17.0)
IMMATURE GRANULOCYTES: 1 % — ABNORMAL HIGH (ref 0.0–0.5)
LYMPHOCYTES: 6 % — ABNORMAL LOW (ref 12–49)
MCH: 26.2 PG (ref 26.0–34.0)
MCHC: 32.2 g/dL (ref 30.0–36.5)
MCV: 81.3 FL (ref 80.0–99.0)
MONOCYTES: 9 % (ref 5–13)
MPV: 11.1 FL (ref 8.9–12.9)
NEUTROPHILS: 84 % — ABNORMAL HIGH (ref 32–75)
NRBC: 0 PER 100 WBC
PLATELET: 272 10*3/uL (ref 150–400)
RBC: 4.01 M/uL — ABNORMAL LOW (ref 4.10–5.70)
RDW: 15.7 % — ABNORMAL HIGH (ref 11.5–14.5)
WBC: 15.4 10*3/uL — ABNORMAL HIGH (ref 4.1–11.1)

## 2019-10-03 LAB — COMPREHENSIVE METABOLIC PANEL
ALT: 43 U/L (ref 12–78)
AST: 70 U/L — ABNORMAL HIGH (ref 15–37)
Albumin/Globulin Ratio: 0.6 — ABNORMAL LOW (ref 1.1–2.2)
Albumin: 3.3 g/dL — ABNORMAL LOW (ref 3.5–5.0)
Alkaline Phosphatase: 95 U/L (ref 45–117)
Anion Gap: 6 mmol/L (ref 5–15)
BUN: 29 MG/DL — ABNORMAL HIGH (ref 6–20)
Bun/Cre Ratio: 16 (ref 12–20)
CO2: 19 mmol/L — ABNORMAL LOW (ref 21–32)
Calcium: 9.1 MG/DL (ref 8.5–10.1)
Chloride: 112 mmol/L — ABNORMAL HIGH (ref 97–108)
Creatinine: 1.84 MG/DL — ABNORMAL HIGH (ref 0.70–1.30)
EGFR IF NonAfrican American: 35 mL/min/{1.73_m2} — ABNORMAL LOW (ref >60)
GFR African American: 43 mL/min/{1.73_m2} — ABNORMAL LOW (ref >60)
Globulin: 5.1 g/dL — ABNORMAL HIGH (ref 2.0–4.0)
Glucose: 96 mg/dL (ref 65–100)
Potassium: 3.9 mmol/L (ref 3.5–5.1)
Sodium: 137 mmol/L (ref 136–145)
Total Bilirubin: 0.6 MG/DL (ref 0.2–1.0)
Total Protein: 8.4 g/dL — ABNORMAL HIGH (ref 6.4–8.2)

## 2019-10-03 LAB — CBC WITH AUTO DIFFERENTIAL
Basophils %: 0 % (ref 0–1)
Basophils Absolute: 0 10*3/uL (ref 0.0–0.1)
Eosinophils %: 0 % (ref 0–7)
Eosinophils Absolute: 0 10*3/uL (ref 0.0–0.4)
Granulocyte Absolute Count: 0.1 10*3/uL — ABNORMAL HIGH (ref 0.00–0.04)
Hematocrit: 32.6 % — ABNORMAL LOW (ref 36.6–50.3)
Hemoglobin: 10.5 g/dL — ABNORMAL LOW (ref 12.1–17.0)
Immature Granulocytes: 1 % — ABNORMAL HIGH (ref 0.0–0.5)
Lymphocytes %: 6 % — ABNORMAL LOW (ref 12–49)
Lymphocytes Absolute: 0.9 10*3/uL (ref 0.8–3.5)
MCH: 26.2 PG (ref 26.0–34.0)
MCHC: 32.2 g/dL (ref 30.0–36.5)
MCV: 81.3 FL (ref 80.0–99.0)
MPV: 11.1 FL (ref 8.9–12.9)
Monocytes %: 9 % (ref 5–13)
Monocytes Absolute: 1.4 10*3/uL — ABNORMAL HIGH (ref 0.0–1.0)
NRBC Absolute: 0 10*3/uL (ref 0.00–0.01)
Neutrophils %: 84 % — ABNORMAL HIGH (ref 32–75)
Neutrophils Absolute: 12.9 10*3/uL — ABNORMAL HIGH (ref 1.8–8.0)
Nucleated RBCs: 0 PER 100 WBC
Platelets: 272 10*3/uL (ref 150–400)
RBC: 4.01 M/uL — ABNORMAL LOW (ref 4.10–5.70)
RDW: 15.7 % — ABNORMAL HIGH (ref 11.5–14.5)
WBC: 15.4 10*3/uL — ABNORMAL HIGH (ref 4.1–11.1)

## 2019-10-03 MED ORDER — ACETAMINOPHEN 500 MG TAB
500 mg | Freq: Every evening | ORAL | Status: DC | PRN
Start: 2019-10-03 — End: 2019-10-07
  Administered 2019-10-03 – 2019-10-07 (×3): via ORAL

## 2019-10-03 MED ORDER — TRAMADOL 50 MG TAB
50 mg | Freq: Once | ORAL | Status: AC
Start: 2019-10-03 — End: 2019-10-02
  Administered 2019-10-03: 02:00:00 via ORAL

## 2019-10-03 MED ORDER — ENOXAPARIN 40 MG/0.4 ML SUB-Q SYRINGE
40 mg/0.4 mL | SUBCUTANEOUS | Status: DC
Start: 2019-10-03 — End: 2019-10-07
  Administered 2019-10-04 – 2019-10-07 (×4): via SUBCUTANEOUS

## 2019-10-03 MED FILL — PANTOPRAZOLE 40 MG TAB, DELAYED RELEASE: 40 mg | ORAL | Qty: 1

## 2019-10-03 MED FILL — TRAMADOL 50 MG TAB: 50 mg | ORAL | Qty: 1

## 2019-10-03 MED FILL — MAPAP (ACETAMINOPHEN) 325 MG TABLET: 325 mg | ORAL | Qty: 2

## 2019-10-03 MED FILL — HEALTHYLAX 17 GRAM ORAL POWDER PACKET: 17 gram | ORAL | Qty: 1

## 2019-10-03 MED FILL — ONDANSETRON (PF) 4 MG/2 ML INJECTION: 4 mg/2 mL | INTRAMUSCULAR | Qty: 2

## 2019-10-03 MED FILL — ACETAMINOPHEN 500 MG TAB: 500 mg | ORAL | Qty: 1

## 2019-10-03 MED FILL — ATENOLOL 25 MG TAB: 25 mg | ORAL | Qty: 1

## 2019-10-03 MED FILL — DOK PLUS 8.6 MG-50 MG TABLET: ORAL | Qty: 1

## 2019-10-03 NOTE — Progress Notes (Signed)
End of Shift Note    Bedside shift change report given to  (oncoming nurse) by Barbaraann Barthel, RN (offgoing nurse).  Report included the following information SBAR    Shift worked:  3p-7p     Shift summary and any significant changes:    Pt resting comfortably in room   Concerns for physician to address:    Zone phone for oncoming shift:       Activity:  Activity Level: Up with Assistance  Number times ambulated in hallways past shift: 0  Number of times OOB to chair past shift: 0    Cardiac:   Cardiac Monitoring: Yes      Cardiac Rhythm: Pacer Undersensing    Access:   Current line(s): PIV     Genitourinary:   Urinary status: voiding    Respiratory:   O2 Device: None (Room air)  Chronic home O2 use?: NO  Incentive spirometer at bedside: NO     GI:  Last Bowel Movement Date: 10/02/19  Current diet:  ADULT DIET Regular  Passing flatus: YES  Tolerating current diet: YES       Pain Management:   Patient states pain is manageable on current regimen: YES    Skin:  Braden Score: 18  Interventions: increase time out of bed    Patient Safety:  Fall Score: Total Score: 2  Interventions: bed/chair alarm, assistive device (walker, cane, etc), gripper socks and pt to call before getting OOB       Length of Stay:  Expected LOS: 2d 4h  Actual LOS: 1      Barbaraann Barthel, RN

## 2019-10-03 NOTE — Progress Notes (Signed)
End of Shift Note    Bedside shift change report given to Belet RN (oncoming nurse) by Clancy Gourd, RN (offgoing nurse).  Report included the following information SBAR, Kardex, ED Summary, Intake/Output, MAR and Recent Results    Shift worked:  1900-0700     Shift summary and any significant changes:     Patient demanding and rude.  Patient confused  Patient medicate with tylenol for c/o pain to abdomen and foot wound.  Patient nausea x1.  Medicate with Zofran with good relief.  Vitals stable.  Tolerating PO well.     Concerns for physician to address:  see above     Zone phone for oncoming shift:  [phone]     Activity:  Activity Level: Up with Assistance  Number times ambulated in hallways past shift: 0  Number of times OOB to chair past shift: 3    Cardiac:   Cardiac Monitoring: Yes      Cardiac Rhythm: Pacer Undersensing    Access:   Current line(s): PIV     Genitourinary:   Urinary status: voiding, incontinent and foley    Respiratory:   O2 Device: None (Room air)  Chronic home O2 use?: NO  Incentive spirometer at bedside: NO     GI:  Last Bowel Movement Date: 10/02/19  Current diet:  ADULT DIET Regular  Passing flatus: YES  Tolerating current diet: YES       Pain Management:   Patient states pain is manageable on current regimen: No.      Skin:  Braden Score: 15  Interventions: increase time out of bed, foam dressing and PT/OT consult    Patient Safety:  Fall Score: Total Score: 2  Interventions: bed/chair alarm, assistive device (walker, cane, etc), gripper socks, pt to call before getting OOB and stay with me (per policy)       Length of Stay:  Expected LOS: 2d 4h  Actual LOS: 1      Melody A Sarmiento, RN

## 2019-10-03 NOTE — Progress Notes (Signed)
Progress Notes by Gwynneth Aliment., NP at 10/03/19 1442                Author: Gwynneth Aliment., NP  Service: Internal Medicine  Author Type: Nurse Practitioner       Filed: 10/03/19 1822  Date of Service: 10/03/19 1442  Status: Signed          Editor: Gwynneth Aliment., NP (Nurse Practitioner)               I reviewed pertinent labs and imaging, and discussed /agreed on the plan of care with Dr. Riley Kill.           Hospitalist Progress Note      NAME: Michael Lozano    DOB:  1934/08/09    MRN:  [id number]          Assessment / Plan:     Acute kidney injury likely prerenal   CKD stage 3   - Baseline Cr 1.2-1.5   -admitted Cr at 2.08 likely due to poor oral intake - pre renal    - Cr at 1.84 this morning    - cont gentle IV hydration    - monitor in am       Orthostatic hypotension due to poor oral intake   - orthostatic on admission    -will cont to monitor       Constipation   Abdominal pain likely due to constipation- resolved    - CT showed large amount of rectal stool , urinary bladder wall thickening    - pt had BM after SSE       Chronic right foot ulcer   - no signs or erythema or tenderness    -appreciate wound care nurse - recc  Right plantar DM wound: MWF, cleanse with wound cleanser spray and 4x4, wipe clean. Apply a small amount of Therahoney gel to a piece of Opticell AG and cover wound.  Secure with dry 4x4 and tape.      History of DVT    - not on chronic AC       Hypertension   GERD   -Continue bisoprolol or formulary   -Hold lisinopril and Lasix due to acute kidney injury   - hypotensive today - monitor    -Continue home PPI      Body mass index is 26.85 kg/m??.      Estimated discharge date: August 20   Barriers:      Code status: Full   Prophylaxis: Lovenox   Recommended Disposition: Home w/Family          Subjective:        Chief Complaint / Reason for Physician Visit   "I am doing fine ".  Discussed with RN events overnight. Pt seen in bed offers no complaints discussed renal  function with pt and need for hydration    Review of Systems:           Symptom  Y/N  Comments    Symptom  Y/N  Comments             Fever/Chills  n      Chest Pain  n               Poor Appetite  n      Edema  n               Cough  n      Abdominal Pain  n  Sputum  n      Joint Pain         SOB/DOE  n      Pruritis/Rash  n       Nausea/vomit  n      Tolerating PT/OT  n       Diarrhea  n      Tolerating Diet  y               Constipation        Other               Could NOT obtain due to:            Objective:        VITALS:    Last 24hrs VS reviewed since prior progress note. Most recent are:   Patient Vitals for the past 24 hrs:            Temp  Pulse  Resp  BP  SpO2            10/03/19 1430  97.3 ??F (36.3 ??C)  75  20  (!) 106/59  93 %            10/03/19 1135  97.2 ??F (36.2 ??C)  65  19  (!) 109/50  98 %     10/03/19 0831  98.2 ??F (36.8 ??C)  63  20  127/73  98 %     10/02/19 2353  98.5 ??F (36.9 ??C)  87  18  137/64  98 %     10/02/19 1936  97.5 ??F (36.4 ??C)  79  18  136/74  100 %            10/02/19 1604  97.7 ??F (36.5 ??C)  63  18  (!) 145/75  100 %           Intake/Output Summary (Last 24 hours) at 10/03/2019 1443   Last data filed at 10/03/2019 4008     Gross per 24 hour        Intake  680 ml        Output  --        Net  680 ml            I had a face to face encounter and independently examined this patient on 10/03/2019, as outlined below:   PHYSICAL EXAM:   General: Thin malnourished  Alert, cooperative,   EENT:  EOMI. Anicteric sclerae. MMM   Resp:  CTA bilaterally, no wheezing or rales.  No accessory muscle use   CV:  S1S2,  No edema   GI:  Soft, Non distended, Non tender.  +Bowel sounds + flatus    Neurologic:  Alert and oriented X 1, normal speech,    Psych:   . Not anxious nor agitated   Skin:  No rashes.  No jaundice      Reviewed most current lab test results and cultures  YES   Reviewed most current radiology test results   YES   Review and summation of old records today    NO   Reviewed patient's  current orders and MAR    YES   PMH/SH reviewed - no change compared to H&P   ________________________________________________________________________   Care Plan discussed with:           Comments         Patient  x  Family              RN  x       Care Manager             Consultant                                  Multidiciplinary team rounds were held today with case manager, nursing, pharmacist and Higher education careers adviser.  Patient's plan of care was discussed; medications  were reviewed and discharge planning was addressed.       ________________________________________________________________________   Total NON critical care TIME:  30 Minutes      Total CRITICAL CARE TIME Spent:   Minutes non procedure based              Comments         >50% of visit spent in counseling and coordination of care         ________________________________________________________________________   Nolon Stalls. Thomes Cake, NP       Procedures: see electronic medical records for all procedures/Xrays and details which were not copied into this note but were reviewed prior to creation of Plan.        LABS:   I reviewed today's most current labs and imaging studies.   Pertinent labs include:     Recent Labs            10/03/19   0504  10/02/19   0023     WBC  15.4*  7.9     HGB  10.5*  13.1     HCT  32.6*  39.5         PLT  272  387          Recent Labs            10/03/19   0249  10/02/19   0023     NA  137  139     K  3.9  4.3     CL  112*  108     CO2  19*  19*     GLU  96  100     BUN  29*  39*     CREA  1.84*  2.08*     CA  9.1  10.7*     ALB  3.3*  4.3     TBILI  0.6  0.7         ALT  43  19           Signed: Ruthanne Mcneish A. Thomes Cake, NP

## 2019-10-03 NOTE — Progress Notes (Signed)
 Problem: Mobility Impaired (Adult and Pediatric)  Goal: *Acute Goals and Plan of Care (Insert Text)  Description: FUNCTIONAL STATUS PRIOR TO ADMISSION: Patient was modified independent using a single point cane for functional mobility. But patient reports multiple recent falls and crawling on floor at times due to dizziness and fear of falling.     HOME SUPPORT PRIOR TO ADMISSION: The patient lived with granddaughter but reports she is never home. Reports he doesn't need assistance at baseline except for granddaughter drives him to appointments.     Physical Therapy Goals  Initiated 10/02/2019  1.  Patient will move from supine to sit and sit to supine , scoot up and down, and roll side to side in bed with modified independence within 7 day(s).    2.  Patient will transfer from bed to chair and chair to bed with modified independence using the least restrictive device within 7 day(s).  3.  Patient will perform sit to stand with modified independence within 7 day(s).  4.  Patient will ambulate with modified independence for 100 feet with the least restrictive device within 7 day(s).      Outcome: Progressing Towards Goal   PHYSICAL THERAPY TREATMENT  Patient: Michael Lozano (85 y.o. male)  Date: 10/03/2019  Diagnosis: AKI (acute kidney injury) (HCC) [N17.9]  Orthostatic hypotension [I95.1]  Right foot ulcer (HCC) [L97.519] <principal problem not specified>       Precautions: Fall  Chart, physical therapy assessment, plan of care and goals were reviewed.    ASSESSMENT  Patient continues with skilled PT services and is progressing towards goals. Pt agreeable to participate. He is able to come to edge of bed and scoot with CGA and extra time. He comes to stand with cane for support with CGA and extra time. He amb with SPC with CGA with flexed posture and shortened steps, slowed pace. Improved amb from eval. Pt still presents with fall risk with forward shift of center of gravity. No c/o dizziness with amb. Tolerated  session well. Pt continues appropriate for SNF rehab to maximize function and safety.    Current Level of Function Impacting Discharge (mobility/balance): Overall CGA, slow moving needing extra time    Other factors to consider for discharge: fall risk, needs supervision         PLAN :  Patient continues to benefit from skilled intervention to address the above impairments.  Continue treatment per established plan of care.  to address goals.    Recommendation for discharge: (in order for the patient to meet his/her long term goals)  Therapy up to 5 days/week in SNF setting    This discharge recommendation:  Has been made in collaboration with the attending provider and/or case management    IF patient discharges home will need the following DME: patient owns DME required for discharge       SUBJECTIVE:   Patient stated "I walk fast. Do you want me to run?."    OBJECTIVE DATA SUMMARY:   Critical Behavior:  Neurologic State: Alert  Orientation Level: Oriented X4  Cognition: Follows commands  Safety/Judgement: Awareness of environment, Fall prevention, Decreased insight into deficits  Functional Mobility Training:  Bed Mobility:     Supine to Sit: Contact guard assistance;Additional time     Scooting: Contact guard assistance;Additional time        Transfers:  Sit to Stand: Contact guard assistance;Additional time  Stand to Sit: Contact guard assistance;Additional time        Bed to  Chair: Contact guard assistance;Additional time;Other (comment) (with cane)                    Balance:  Sitting: Intact  Standing: Impaired  Standing - Static: Fair;Other (comment) (with cane)  Standing - Dynamic : Fair;Other (comment) (with cane)  Ambulation/Gait Training:  Distance (ft): 100 Feet (ft) (x2)  Assistive Device: Gait belt;Cane, straight  Ambulation - Level of Assistance: Contact guard assistance        Gait Abnormalities: Decreased step clearance;Other (flexed posture at trunk and LEs)              Speed/Cadence: Slow;Pace  decreased (<100 feet/min)  Step Length: Right shortened;Left shortened               Activity Tolerance:   Fair    After treatment patient left in no apparent distress:   Sitting in chair, Call bell within reach, and Bed / chair alarm activated    COMMUNICATION/COLLABORATION:   The patient's plan of care was discussed with: Registered nurse and Case management.     Devere FORBES Salinas Doornik, PT   Time Calculation: 23 mins

## 2019-10-03 NOTE — Progress Notes (Signed)
Problem: Falls - Risk of  Goal: *Absence of Falls  Description: Document Bridgette Habermann Fall Risk and appropriate interventions in the flowsheet.  Outcome: Progressing Towards Goal  Note: Fall Risk Interventions:  Mobility Interventions: Bed/chair exit alarm, Communicate number of staff needed for ambulation/transfer, OT consult for ADLs, Patient to call before getting OOB, PT Consult for mobility concerns, PT Consult for assist device competence, Utilize walker, cane, or other assistive device         Medication Interventions: Bed/chair exit alarm, Patient to call before getting OOB, Teach patient to arise slowly                   Problem: Patient Education: Go to Patient Education Activity  Goal: Patient/Family Education  Outcome: Progressing Towards Goal     Problem: Pain - Acute  Goal: *Control of acute pain  Outcome: Progressing Towards Goal

## 2019-10-04 LAB — CBC W/O DIFF
ABSOLUTE NRBC: 0 10*3/uL (ref 0.00–0.01)
HCT: 30.4 % — ABNORMAL LOW (ref 36.6–50.3)
HGB: 10.2 g/dL — ABNORMAL LOW (ref 12.1–17.0)
MCH: 26.8 PG (ref 26.0–34.0)
MCHC: 33.6 g/dL (ref 30.0–36.5)
MCV: 80 FL (ref 80.0–99.0)
MPV: 10.9 FL (ref 8.9–12.9)
NRBC: 0 PER 100 WBC
PLATELET: 234 10*3/uL (ref 150–400)
RBC: 3.8 M/uL — ABNORMAL LOW (ref 4.10–5.70)
RDW: 15.6 % — ABNORMAL HIGH (ref 11.5–14.5)
WBC: 15.2 10*3/uL — ABNORMAL HIGH (ref 4.1–11.1)

## 2019-10-04 LAB — METABOLIC PANEL, BASIC
Anion gap: 6 mmol/L (ref 5–15)
BUN/Creatinine ratio: 12 (ref 12–20)
BUN: 23 MG/DL — ABNORMAL HIGH (ref 6–20)
CO2: 21 mmol/L (ref 21–32)
Calcium: 9 MG/DL (ref 8.5–10.1)
Chloride: 110 mmol/L — ABNORMAL HIGH (ref 97–108)
Creatinine: 1.93 MG/DL — ABNORMAL HIGH (ref 0.70–1.30)
GFR est AA: 41 mL/min/{1.73_m2} — ABNORMAL LOW (ref >60)
GFR est non-AA: 33 mL/min/{1.73_m2} — ABNORMAL LOW (ref >60)
Glucose: 89 mg/dL (ref 65–100)
Potassium: 3.8 mmol/L (ref 3.5–5.1)
Sodium: 137 mmol/L (ref 136–145)

## 2019-10-04 LAB — HEMOGLOBIN A1C WITH EAG
Est. average glucose: 117 mg/dL
Hemoglobin A1c: 5.7 % — ABNORMAL HIGH (ref 4.0–5.6)

## 2019-10-04 LAB — CBC
Hematocrit: 30.4 % — ABNORMAL LOW (ref 36.6–50.3)
Hemoglobin: 10.2 g/dL — ABNORMAL LOW (ref 12.1–17.0)
MCH: 26.8 PG (ref 26.0–34.0)
MCHC: 33.6 g/dL (ref 30.0–36.5)
MCV: 80 FL (ref 80.0–99.0)
MPV: 10.9 FL (ref 8.9–12.9)
NRBC Absolute: 0 10*3/uL (ref 0.00–0.01)
Nucleated RBCs: 0 PER 100 WBC
Platelets: 234 10*3/uL (ref 150–400)
RBC: 3.8 M/uL — ABNORMAL LOW (ref 4.10–5.70)
RDW: 15.6 % — ABNORMAL HIGH (ref 11.5–14.5)
WBC: 15.2 10*3/uL — ABNORMAL HIGH (ref 4.1–11.1)

## 2019-10-04 LAB — BASIC METABOLIC PANEL
Anion Gap: 6 mmol/L (ref 5–15)
BUN: 23 MG/DL — ABNORMAL HIGH (ref 6–20)
Bun/Cre Ratio: 12 (ref 12–20)
CO2: 21 mmol/L (ref 21–32)
Calcium: 9 MG/DL (ref 8.5–10.1)
Chloride: 110 mmol/L — ABNORMAL HIGH (ref 97–108)
Creatinine: 1.93 MG/DL — ABNORMAL HIGH (ref 0.70–1.30)
EGFR IF NonAfrican American: 33 mL/min/{1.73_m2} — ABNORMAL LOW (ref >60)
GFR African American: 41 mL/min/{1.73_m2} — ABNORMAL LOW (ref >60)
Glucose: 89 mg/dL (ref 65–100)
Potassium: 3.8 mmol/L (ref 3.5–5.1)
Sodium: 137 mmol/L (ref 136–145)

## 2019-10-04 LAB — HEMOGLOBIN A1C W/EAG
Hemoglobin A1C: 5.7 % — ABNORMAL HIGH (ref 4.0–5.6)
eAG: 117 mg/dL

## 2019-10-04 MED ORDER — SODIUM CHLORIDE 0.9 % IV
INTRAVENOUS | Status: DC
Start: 2019-10-04 — End: 2019-10-07
  Administered 2019-10-04 – 2019-10-06 (×4): via INTRAVENOUS

## 2019-10-04 MED ORDER — GABAPENTIN 100 MG CAP
100 mg | Freq: Three times a day (TID) | ORAL | Status: DC
Start: 2019-10-04 — End: 2019-10-04

## 2019-10-04 MED ORDER — GABAPENTIN 100 MG CAP
100 mg | Freq: Two times a day (BID) | ORAL | Status: DC
Start: 2019-10-04 — End: 2019-10-07
  Administered 2019-10-04 – 2019-10-07 (×6): via ORAL

## 2019-10-04 MED FILL — DOK PLUS 8.6 MG-50 MG TABLET: ORAL | Qty: 1

## 2019-10-04 MED FILL — ATENOLOL 25 MG TAB: 25 mg | ORAL | Qty: 1

## 2019-10-04 MED FILL — ENOXAPARIN 40 MG/0.4 ML SUB-Q SYRINGE: 40 mg/0.4 mL | SUBCUTANEOUS | Qty: 0.4

## 2019-10-04 MED FILL — MAPAP (ACETAMINOPHEN) 325 MG TABLET: 325 mg | ORAL | Qty: 2

## 2019-10-04 MED FILL — PANTOPRAZOLE 40 MG TAB, DELAYED RELEASE: 40 mg | ORAL | Qty: 1

## 2019-10-04 MED FILL — GABAPENTIN 100 MG CAP: 100 mg | ORAL | Qty: 1

## 2019-10-04 MED FILL — SODIUM CHLORIDE 0.9 % IV: INTRAVENOUS | Qty: 1000

## 2019-10-04 NOTE — Progress Notes (Signed)
Problem: Falls - Risk of  Goal: *Absence of Falls  Description: Document Bridgette Habermann Fall Risk and appropriate interventions in the flowsheet.  Outcome: Progressing Towards Goal  Note: Fall Risk Interventions:  Mobility Interventions: Communicate number of staff needed for ambulation/transfer, Patient to call before getting OOB    Mentation Interventions: Door open when patient unattended, More frequent rounding, Reorient patient, Room close to nurse's station    Medication Interventions: Patient to call before getting OOB, Teach patient to arise slowly                   Problem: Patient Education: Go to Patient Education Activity  Goal: Patient/Family Education  Outcome: Progressing Towards Goal     Problem: Patient Education: Go to Patient Education Activity  Goal: Patient/Family Education  Outcome: Progressing Towards Goal     Problem: Patient Education: Go to Patient Education Activity  Goal: Patient/Family Education  Outcome: Progressing Towards Goal     Problem: Pain - Acute  Goal: *Control of acute pain  Outcome: Progressing Towards Goal     Problem: Patient Education: Go to Patient Education Activity  Goal: Patient/Family Education  Outcome: Progressing Towards Goal     Problem: Pressure Injury - Risk of  Goal: *Prevention of pressure injury  Description: Document Braden Scale and appropriate interventions in the flowsheet.  Outcome: Progressing Towards Goal  Note: Pressure Injury Interventions:  Sensory Interventions: Keep linens dry and wrinkle-free, Maintain/enhance activity level, Minimize linen layers, Monitor skin under medical devices    Moisture Interventions: Absorbent underpads    Activity Interventions: Increase time out of bed, PT/OT evaluation    Mobility Interventions: Assess need for specialty bed, HOB 30 degrees or less, PT/OT evaluation    Nutrition Interventions: Document food/fluid/supplement intake    Friction and Shear Interventions: Lift sheet, Lift team/patient mobility team, Minimize  layers                Problem: Patient Education: Go to Patient Education Activity  Goal: Patient/Family Education  Outcome: Progressing Towards Goal

## 2019-10-04 NOTE — Progress Notes (Signed)
Problem: Self Care Deficits Care Plan (Adult)  Goal: *Acute Goals and Plan of Care (Insert Text)  Description:   FUNCTIONAL STATUS PRIOR TO ADMISSION: Patient was modified independent using a single point cane for functional mobility. Patient was modified independent for basic and instrumental ADLs. Patient has been experiencing increase in falls and is concerned about returning home alone. Patient reported his granddaughter lived with him but has not been home in over a month.      HOME SUPPORT: The patient lived alone with no local support.    Occupational Therapy Goals  Initiated 10/02/2019  1.  Patient will perform grooming standing at sink with supervision/set-up within 7 day(s).  2.  Patient will perform seated sponge bathing with supervision/set-up within 7 day(s).  3.  Patient will perform lower body dressing using AE PRN with supervision/set-up within 7 day(s).  4.  Patient will perform toilet transfers using LRAD with supervision/set-up within 7 day(s).  5.  Patient will perform all aspects of toileting with supervision/set-up within 7 day(s).  6.  Patient will participate in upper extremity therapeutic exercise/activities with supervision/set-up for 5 minutes within 7 day(s).    7.  Patient will utilize energy conservation techniques during functional activities with verbal cues within 7 day(s).   Outcome: Progressing Towards Goal    OCCUPATIONAL THERAPY TREATMENT  Patient: Michael Lozano (85 y.o. male)  Date: 10/04/2019  Diagnosis: AKI (acute kidney injury) (HCC) [N17.9]  Orthostatic hypotension [I95.1]  Right foot ulcer (HCC) [L97.519] <principal problem not specified>       Precautions: Fall  Chart, occupational therapy assessment, plan of care, and goals were reviewed.    ASSESSMENT  Patient continues with skilled OT services and is progressing towards goals.  Patient participates well with encouragement. He requires overall CG to SBA for functional mobility during ADL. Patient presents with  intermittent confusion and decreased short term memory; requires verbal and visual cues throughout for safety and proper techniques. BP did drop slightly with standing, but he was asymptomatic, and her recovered well with extended period of time in standing. Continue to recommend SNF upon discharge.       PLAN :  Patient continues to benefit from skilled intervention to address the above impairments.  Continue treatment per established plan of care to address goals.    Recommendation for discharge: (in order for the patient to meet his/her long term goals)  Therapy up to 5 days/week in SNF setting    This discharge recommendation:  Has been made in collaboration with the attending provider and/or case management    IF patient discharges home will need the following DME: Defer to facility       SUBJECTIVE:   Patient stated ???I'm not thinking straight this morning.???    OBJECTIVE DATA SUMMARY:   Cognitive/Behavioral Status:  Neurologic State: Alert;Confused  Orientation Level: Oriented X4 (intermittent confusion)  Cognition: Follows commands;Impaired decision making;Decreased attention/concentration  Perception: Appears intact  Perseveration: Perseverates during conversation  Safety/Judgement: Decreased insight into deficits;Decreased awareness of need for safety    Functional Mobility and Transfers for ADLs:  Bed Mobility:       Transfers:     Functional Transfers  Bathroom Mobility: Contact guard assistance  Toilet Transfer : Contact guard assistance  Cues: Verbal cues provided;Visual cues provided       Balance:  Sitting: Intact  Standing: Impaired  Standing - Static: Fair  Standing - Dynamic : Fair    ADL Intervention:  Feeding  Feeding Assistance: Modified independent  Grooming  Position Performed: Standing  Washing Face: Stand-by assistance  Washing Hands: Stand-by assistance  Brushing Teeth: Stand-by assistance  Brushing/Combing Hair: Stand-by assistance  Cues: Verbal cues provided;Visual cues provided                    Lower Body Dressing Assistance  Socks: Moderate assistance  Position Performed: Seated edge of bed         Cognitive Retraining  Safety/Judgement: Decreased insight into deficits;Decreased awareness of need for safety    Pain:  No complaints    Activity Tolerance:   Fair    After treatment patient left in no apparent distress:   Sitting in chair, Call bell within reach, and Bed / chair alarm activated    COMMUNICATION/COLLABORATION:   The patient???s plan of care was discussed with: Physical therapist and Registered nurse.     Casimiro Needle, OTR/L  Time Calculation: 35 mins

## 2019-10-04 NOTE — Progress Notes (Signed)
End of Shift Note    Bedside shift change report given to Fayrene Fearing (Cabin crew) by Montez Morita (offgoing nurse).  Report included the following information SBAR, Kardex, Intake/Output, MAR, Accordion and Recent Results    Shift worked:  7a-7p     Shift summary and any significant changes:     PIV infiltrated - day shift unsuccessful and  PICC team unable to come insert new PIV - will pass on to night nurse;      Concerns for physician to address:       Zone phone for oncoming shift:          Activity:  Activity Level: Up with Assistance  Number times ambulated in hallways past shift: 1  Number of times OOB to chair past shift: 1    Cardiac:   Cardiac Monitoring: No      Cardiac Rhythm: Ventricular Paced    Access:   Current line(s): no access     Genitourinary:   Urinary status: voiding    Respiratory:   O2 Device: None (Room air)  Chronic home O2 use?: NO  Incentive spirometer at bedside: NO     GI:  Last Bowel Movement Date: 10/02/19  Current diet:  ADULT DIET Regular  DIET ONE TIME MESSAGE  Passing flatus: YES  Tolerating current diet: YES       Pain Management:   Patient states pain is manageable on current regimen: NO    Skin:  Braden Score: 18  Interventions: increase time out of bed    Patient Safety:  Fall Score: Total Score: 2  Interventions: pt to call before getting OOB       Length of Stay:  Expected LOS: 2d 4h  Actual LOS: 2      Montez Morita                          '

## 2019-10-04 NOTE — Progress Notes (Signed)
 Transition of Care Plan:    RUR: 32% high risk   Disposition: Anticipating SNF-accepted by [facility]   COVID test: Pt has not been vaccinated, will need rapid COVID test resulted prior to d/c  Follow up appointments: PCP and specialist as indicated  DME needed: Pt has a cane.   Transportation at Discharge: Anticipating need for BLS   Keys or means to access home: Pt has access to home       IM Medicare Letter: Will need 2nd IMM Letter   Is patient a BCPI-A Bundle: N/A                                                                                       If yes, was Bundle Letter given?: N/A  Caregiver Contact: Pt's daughter, Rock Titus squibb) [phone]  Discharge Caregiver contacted prior to discharge?  yes    Initial note:  Chart reviewed. CM sent referrals for SNF placement upon d/c to [facility] and [facility]  Pt was accepted by both facilities.  CM met with pt to review SNF options and pt chose [facility].  Verbal Freedom of choice verified.  CM spoke with Naomie in admissions at [facility] to confirm acceptance and bed availability for possible d/c tomorrow (10/04/19). Dorothy confirmed bed availability for tomorrow and will begin the authorization process pending receipt of updated clinicals.  CM will continue for f/u with [facility] re: admission and updated clinicals.    Lucie Hillock, MSN

## 2019-10-04 NOTE — Progress Notes (Signed)
 Problem: Mobility Impaired (Adult and Pediatric)  Goal: *Acute Goals and Plan of Care (Insert Text)  Description: FUNCTIONAL STATUS PRIOR TO ADMISSION: Patient was modified independent using a single point cane for functional mobility. But patient reports multiple recent falls and crawling on floor at times due to dizziness and fear of falling.     HOME SUPPORT PRIOR TO ADMISSION: The patient lived with granddaughter but reports she is never home. Reports he doesn't need assistance at baseline except for granddaughter drives him to appointments.     Physical Therapy Goals  Initiated 10/02/2019  1.  Patient will move from supine to sit and sit to supine , scoot up and down, and roll side to side in bed with modified independence within 7 day(s).    2.  Patient will transfer from bed to chair and chair to bed with modified independence using the least restrictive device within 7 day(s).  3.  Patient will perform sit to stand with modified independence within 7 day(s).  4.  Patient will ambulate with modified independence for 100 feet with the least restrictive device within 7 day(s).      Outcome: Progressing Towards Goal   PHYSICAL THERAPY TREATMENT  Patient: Michael Lozano (85 y.o. male)  Date: 10/04/2019  Diagnosis: AKI (acute kidney injury) (HCC) [N17.9]  Orthostatic hypotension [I95.1]  Right foot ulcer (HCC) [L97.519] <principal problem not specified>       Precautions: Fall  Chart, physical therapy assessment, plan of care and goals were reviewed.    ASSESSMENT  Patient continues with skilled PT services and is progressing towards goals. Pt cooperative today. Follows commands well but does exhibit some intermittent confusion especially regarding time and hx of events. He is able to complete bed mobility with SBA to supervision. He transfers with CGA. He amb with slowed pace, flexed posture and CGA with use of SPC for 100'. Vitals as follows:  Vitals:    10/04/19 0800 10/04/19 1004 10/04/19 1009 10/04/19 1018    BP: (!) 102/54 105/74 (!) 100/57 116/62   BP 1 Location: Right arm Left arm Left arm Left arm   BP Patient Position: At rest Sitting Standing Standing;Other (Comment)  Comment: post standing ~5 min for ADL   Pulse: 64 83 (!) 103 (!) 102   Temp: 98.4 F (36.9 C)      Resp: 18      Height:       Weight:       SpO2: 98% 99%          Current Level of Function Impacting Discharge (mobility/balance): Overall supervision to CGA, uses SPC    Other factors to consider for discharge: Pt alone much of time at baseline with hx of falls         PLAN :  Patient continues to benefit from skilled intervention to address the above impairments.  Continue treatment per established plan of care.  to address goals.    Recommendation for discharge: (in order for the patient to meet his/her long term goals)  Therapy up to 5 days/week in SNF setting    This discharge recommendation:  Has been made in collaboration with the attending provider and/or case management    IF patient discharges home will need the following DME: patient owns DME required for discharge       SUBJECTIVE:   Patient stated "I'm 85 years old....(then later) I'm 85 yrs old."    OBJECTIVE DATA SUMMARY:   Critical Behavior:  Neurologic State: Alert,  Confused  Orientation Level: Oriented X4 (intermittent confusion)  Cognition: Follows commands, Impaired decision making, Decreased attention/concentration  Safety/Judgement: Decreased insight into deficits, Decreased awareness of need for safety  Functional Mobility Training:  Bed Mobility:  Rolling: Supervision  Supine to Sit: Stand-by assistance     Scooting: Stand-by assistance        Transfers:  Sit to Stand: Contact guard assistance;Additional time  Stand to Sit: Contact guard assistance        Bed to Chair: Contact guard assistance;Additional time                    Balance:  Sitting: Intact  Standing: Impaired  Standing - Static: Fair  Standing - Dynamic : Fair  Ambulation/Gait Training:  Distance (ft): 100 Feet  (ft)  Assistive Device: Gait belt;Cane, straight  Ambulation - Level of Assistance: Contact guard assistance        Gait Abnormalities: Decreased step clearance;Other (flexed posture)              Speed/Cadence: Slow;Pace decreased (<100 feet/min)  Step Length: Right shortened;Left shortened               Activity Tolerance:   Fair    After treatment patient left in no apparent distress:   Sitting in chair, Call bell within reach, and Bed / chair alarm activated    COMMUNICATION/COLLABORATION:   The patient's plan of care was discussed with: Occupational therapist, Registered nurse, and Case management.     Devere FORBES Salinas Doornik, PT   Time Calculation: 35 mins

## 2019-10-04 NOTE — Progress Notes (Signed)
End of Shift Note    Bedside shift change report given to Katie (oncoming nurse) by Rana Snare (offgoing nurse).  Report included the following information SBAR, Kardex, Intake/Output and MAR    Shift worked:  7p-7a     Shift summary and any significant changes:     No change in pt condition.  Tylenol given once for foot pain.  Pt upset when time to draw morning labs and cursed at this RN stating "F#ck this, I don't know why yall keep doing this every day. It's my body." Pt eventually agreed to allow RN to draw labs, but used foul language through the whole process.  Pt confused with call bell.  Consisently calls out, but when call bell answered pt says he hit the wrong button, education provided.      Concerns for physician to address: WBC still 15 this morning with labs.       Zone phone for oncoming shift:          Activity:  Activity Level: Up with Assistance  Number times ambulated in hallways past shift: 0  Number of times OOB to chair past shift: 0    Cardiac:   Cardiac Monitoring: Yes      Cardiac Rhythm: Ventricular Paced    Access:   Current line(s): PIV     Genitourinary:   Urinary status: voiding    Respiratory:   O2 Device: None (Room air)  Chronic home O2 use?: NO  Incentive spirometer at bedside: NO     GI:  Last Bowel Movement Date: 10/02/19  Current diet:  ADULT DIET Regular  Passing flatus: YES  Tolerating current diet: YES       Pain Management:   Patient states pain is manageable on current regimen: YES    Skin:  Braden Score: 18  Interventions: increase time out of bed and PT/OT consult    Patient Safety:  Fall Score: Total Score: 2  Interventions: gripper socks, pt to call before getting OOB and stay with me (per policy)       Length of Stay:  Expected LOS: 2d 4h  Actual LOS: 2      Rana Snare

## 2019-10-04 NOTE — Progress Notes (Signed)
Progress Notes by Gwynneth Aliment., NP at 10/04/19 1537                Author: Gwynneth Aliment., NP  Service: Internal Medicine  Author Type: Nurse Practitioner       Filed: 10/04/19 1710  Date of Service: 10/04/19 1537  Status: Signed          Editor: Gwynneth Aliment., NP (Nurse Practitioner)               I reviewed pertinent labs and imaging, and discussed /agreed on the plan of care with Dr. Riley Kill.           Hospitalist Progress Note      NAME: Burrel Legrand    DOB:  02/13/1935    MRN:  [id number]          Assessment / Plan:     Acute kidney injury likely prerenal   CKD stage 3   - Baseline Cr 1.2-1.5   -admitted Cr at 2.08 likely due to poor oral intake - pre renal    - Cr at 1.84 this morning    - cont gentle IV hydration    - monitor in am       Orthostatic hypotension due to poor oral intake   - orthostatic on admission but improved    - Sbp 100-116 no drop in systolic with position change    - will cont to monitor       Constipation   Abdominal pain likely due to constipation- resolved    - CT showed large amount of rectal stool , urinary bladder wall thickening    - pt had BM after SSE       Chronic right foot ulcer   - no signs or erythema or tenderness    -appreciate wound care nurse - recc  Right plantar DM wound: MWF, cleanse with wound cleanser spray and 4x4, wipe clean. Apply a small amount  of Therahoney gel to a piece of Opticell AG and cover wound. Secure with dry 4x4 and tape.   - C/o nerve pain in bil foot states comes and goes -added Gabapentin 100 mg trial       History of DVT    - not on chronic AC       Hypertension   GERD   -Continue bisoprolol or formulary   -Hold lisinopril and Lasix due to acute kidney injury   - hypotensive today - monitor    -Continue home PPI      Leukocytosis    -WBC 15.2  , afebrile  No sign of infection    - will check procal in morning       Body mass index is 26.85 kg/m??.      Estimated discharge date: August 20   Barriers:      Code  status: Full   Prophylaxis: Lovenox   Recommended Disposition: Home w/Family          Subjective:        Chief Complaint / Reason for Physician Visit   "my feet hurt  ".  Discussed with RN events overnight. Pt  Seen discussed wound care and management of pain . Pt anxious for discharge to SNF    Review of Systems:           Symptom  Y/N  Comments    Symptom  Y/N  Comments             Fever/Chills  n      Chest Pain  n               Poor Appetite  n      Edema  n               Cough  n      Abdominal Pain  n       Sputum  n      Joint Pain         SOB/DOE  n      Pruritis/Rash  n       Nausea/vomit  n      Tolerating PT/OT  n       Diarrhea  n      Tolerating Diet  y               Constipation        Other               Could NOT obtain due to:            Objective:        VITALS:    Last 24hrs VS reviewed since prior progress note. Most recent are:   Patient Vitals for the past 24 hrs:            Temp  Pulse  Resp  BP  SpO2            10/04/19 1530  98.4 ??F (36.9 ??C)  64  20  (!) 116/57  96 %            10/04/19 1018  --  (!) 102  --  116/62  --     10/04/19 1009  --  (!) 103  --  (!) 100/57  --     10/04/19 1004  --  83  --  105/74  99 %     10/04/19 0800  98.4 ??F (36.9 ??C)  64  18  (!) 102/54  98 %     10/04/19 0751  --  65  --  --  --     10/04/19 0353  99.4 ??F (37.4 ??C)  68  18  115/62  100 %     10/03/19 2314  98.9 ??F (37.2 ??C)  68  18  (!) 101/56  96 %            10/03/19 1956  98.3 ??F (36.8 ??C)  60  18  (!) 119/58  98 %        No intake or output data in the 24 hours ending 10/04/19 1537       I had a face to face encounter and independently examined this patient on 10/04/2019, as outlined below:   PHYSICAL EXAM:   General: Thin malnourished  Alert, cooperative,   EENT:  EOMI. Anicteric sclerae. MMM   Resp:   no wheezing or rales.  No accessory muscle use   CV:  S1S2,  No edema   GI:  Soft, Non distended, Non tender.  +Bowel sounds + flatus    Neurologic:  Alert and oriented X 3, normal speech,    Psych:   .  Not anxious nor agitated   Skin:  No rashes.  No jaundice wound rot plantar R foot       Reviewed most current lab test results and cultures  YES   Reviewed most current radiology test results   YES   Review and summation of  old records today    NO   Reviewed patient's current orders and MAR    YES   PMH/SH reviewed - no change compared to H&P   ________________________________________________________________________   Care Plan discussed with:           Comments         Patient  x           Family              RN  x       Care Manager             Consultant                                  Multidiciplinary team rounds were held today with case manager, nursing, pharmacist and Higher education careers adviser.  Patient's plan of care was discussed; medications  were reviewed and discharge planning was addressed.       ________________________________________________________________________   Total NON critical care TIME:  30 Minutes      Total CRITICAL CARE TIME Spent:   Minutes non procedure based              Comments         >50% of visit spent in counseling and coordination of care         ________________________________________________________________________   Nolon Stalls. Thomes Cake, NP       Procedures: see electronic medical records for all procedures/Xrays and details which were not copied into this note but were reviewed prior to creation of Plan.        LABS:   I reviewed today's most current labs and imaging studies.   Pertinent labs include:     Recent Labs             10/04/19   0342  10/03/19   0504  10/02/19   0023     WBC  15.2*  15.4*  7.9     HGB  10.2*  10.5*  13.1     HCT  30.4*  32.6*  39.5          PLT  234  272  387          Recent Labs             10/04/19   0342  10/03/19   0249  10/02/19   0023     NA  137  137  139     K  3.8  3.9  4.3     CL  110*  112*  108     CO2  21  19*  19*     GLU  89  96  100     BUN  23*  29*  39*     CREA  1.93*  1.84*  2.08*     CA  9.0  9.1  10.7*     ALB   --   3.3*   4.3     TBILI   --   0.6  0.7          ALT   --   43  19           Signed: Melane Windholz A. Thomes Cake, NP

## 2019-10-05 LAB — METABOLIC PANEL, BASIC
Anion gap: 7 mmol/L (ref 5–15)
BUN/Creatinine ratio: 13 (ref 12–20)
BUN: 23 MG/DL — ABNORMAL HIGH (ref 6–20)
CO2: 21 mmol/L (ref 21–32)
Calcium: 9.3 MG/DL (ref 8.5–10.1)
Chloride: 108 mmol/L (ref 97–108)
Creatinine: 1.82 MG/DL — ABNORMAL HIGH (ref 0.70–1.30)
GFR est AA: 43 mL/min/{1.73_m2} — ABNORMAL LOW (ref >60)
GFR est non-AA: 36 mL/min/{1.73_m2} — ABNORMAL LOW (ref >60)
Glucose: 107 mg/dL — ABNORMAL HIGH (ref 65–100)
Potassium: 3.9 mmol/L (ref 3.5–5.1)
Sodium: 136 mmol/L (ref 136–145)

## 2019-10-05 LAB — CBC W/O DIFF
ABSOLUTE NRBC: 0 10*3/uL (ref 0.00–0.01)
HCT: 28.9 % — ABNORMAL LOW (ref 36.6–50.3)
HGB: 9.6 g/dL — ABNORMAL LOW (ref 12.1–17.0)
MCH: 26.3 PG (ref 26.0–34.0)
MCHC: 33.2 g/dL (ref 30.0–36.5)
MCV: 79.2 FL — ABNORMAL LOW (ref 80.0–99.0)
MPV: 11.8 FL (ref 8.9–12.9)
NRBC: 0 PER 100 WBC
PLATELET: 212 10*3/uL (ref 150–400)
RBC: 3.65 M/uL — ABNORMAL LOW (ref 4.10–5.70)
RDW: 15.8 % — ABNORMAL HIGH (ref 11.5–14.5)
WBC: 12.8 10*3/uL — ABNORMAL HIGH (ref 4.1–11.1)

## 2019-10-05 LAB — COVID-19 RAPID TEST: COVID-19 rapid test: NOT DETECTED

## 2019-10-05 LAB — PROCALCITONIN
Procalcitonin: 0.21 ng/mL
Procalcitonin: 0.21 ng/mL

## 2019-10-05 LAB — BASIC METABOLIC PANEL
Anion Gap: 7 mmol/L (ref 5–15)
BUN: 23 MG/DL — ABNORMAL HIGH (ref 6–20)
Bun/Cre Ratio: 13 (ref 12–20)
CO2: 21 mmol/L (ref 21–32)
Calcium: 9.3 MG/DL (ref 8.5–10.1)
Chloride: 108 mmol/L (ref 97–108)
Creatinine: 1.82 MG/DL — ABNORMAL HIGH (ref 0.70–1.30)
EGFR IF NonAfrican American: 36 mL/min/{1.73_m2} — ABNORMAL LOW (ref >60)
GFR African American: 43 mL/min/{1.73_m2} — ABNORMAL LOW (ref >60)
Glucose: 107 mg/dL — ABNORMAL HIGH (ref 65–100)
Potassium: 3.9 mmol/L (ref 3.5–5.1)
Sodium: 136 mmol/L (ref 136–145)

## 2019-10-05 LAB — CBC
Hematocrit: 28.9 % — ABNORMAL LOW (ref 36.6–50.3)
Hemoglobin: 9.6 g/dL — ABNORMAL LOW (ref 12.1–17.0)
MCH: 26.3 PG (ref 26.0–34.0)
MCHC: 33.2 g/dL (ref 30.0–36.5)
MCV: 79.2 FL — ABNORMAL LOW (ref 80.0–99.0)
MPV: 11.8 FL (ref 8.9–12.9)
NRBC Absolute: 0 10*3/uL (ref 0.00–0.01)
Nucleated RBCs: 0 PER 100 WBC
Platelets: 212 10*3/uL (ref 150–400)
RBC: 3.65 M/uL — ABNORMAL LOW (ref 4.10–5.70)
RDW: 15.8 % — ABNORMAL HIGH (ref 11.5–14.5)
WBC: 12.8 10*3/uL — ABNORMAL HIGH (ref 4.1–11.1)

## 2019-10-05 LAB — COVID-19, RAPID: SARS-CoV-2, Rapid: NOT DETECTED

## 2019-10-05 MED FILL — MAPAP (ACETAMINOPHEN) 325 MG TABLET: 325 mg | ORAL | Qty: 2

## 2019-10-05 MED FILL — HEALTHYLAX 17 GRAM ORAL POWDER PACKET: 17 gram | ORAL | Qty: 1

## 2019-10-05 MED FILL — ENOXAPARIN 40 MG/0.4 ML SUB-Q SYRINGE: 40 mg/0.4 mL | SUBCUTANEOUS | Qty: 0.4

## 2019-10-05 MED FILL — ATENOLOL 25 MG TAB: 25 mg | ORAL | Qty: 1

## 2019-10-05 MED FILL — ACETAMINOPHEN 500 MG TAB: 500 mg | ORAL | Qty: 1

## 2019-10-05 MED FILL — GABAPENTIN 100 MG CAP: 100 mg | ORAL | Qty: 1

## 2019-10-05 MED FILL — PANTOPRAZOLE 40 MG TAB, DELAYED RELEASE: 40 mg | ORAL | Qty: 1

## 2019-10-05 NOTE — Progress Notes (Signed)
 Transition of Care Plan:    RUR:33% high risk  Disposition:Anticipating home with home health  COVID test:negative  Follow up appointments:PCPand specialist as indicated  DME needed:Pt has a cane.  Transportation at Discharge:Anticipating need for BLS  Keys or means to access home:Pt has access to home  IM Medicare Letter:Will need 2nd IMM Letter  Is patient a BCPI-A Bundle:N/A  If yes, was Bundle Letter given?:N/A  Caregiver Contact:Pt's daughter, Rock Blotter [phone]  Discharge Caregiver contacted prior to discharge?yes    Initial note:  Chart reviewed.  CM spoke with Naomie from admissions at [facility] re: authorization.  Dorothy confirmed that insurance CM has indicated that pt does not meet criteria for admission.  Informed that referral has been sent to medical director for final review.  It is likely that referral will be denied and pt will return home with [facility]    CM met with pt to discuss d/c plan if pt goes home and having HHC.  Also dicussed Medicaid screening for additional resources.  CM also spoke with Waddell from [employer] re: pt refusal of Medicaid screening.  Waddell agreed to speak with pt again. Pt does not have a much help at home as he would like and gave CM permission to further discuss with daughter Rock.      Spoke with Rock re: the above information.  She is agreeable to d/c home with HHC.  [facility] is the preferred agency.  Rock states that her granddaughter Avi will pick up pt for d/c.  Pt would benefit from Meals on Wheels if agreeable. CM will continue to work on this d/c plan.    Lucie Hillock, MSN

## 2019-10-05 NOTE — Progress Notes (Signed)
Problem: Self Care Deficits Care Plan (Adult)  Goal: *Acute Goals and Plan of Care (Insert Text)  Description:   FUNCTIONAL STATUS PRIOR TO ADMISSION: Patient was modified independent using a single point cane for functional mobility. Patient was modified independent for basic and instrumental ADLs. Patient has been experiencing increase in falls and is concerned about returning home alone. Patient reported his granddaughter lived with him but has not been home in over a month.      HOME SUPPORT: The patient lived alone with no local support.    Occupational Therapy Goals  Initiated 10/02/2019  1.  Patient will perform grooming standing at sink with supervision/set-up within 7 day(s).  2.  Patient will perform seated sponge bathing with supervision/set-up within 7 day(s).  3.  Patient will perform lower body dressing using AE PRN with supervision/set-up within 7 day(s).  4.  Patient will perform toilet transfers using LRAD with supervision/set-up within 7 day(s).  5.  Patient will perform all aspects of toileting with supervision/set-up within 7 day(s).  6.  Patient will participate in upper extremity therapeutic exercise/activities with supervision/set-up for 5 minutes within 7 day(s).    7.  Patient will utilize energy conservation techniques during functional activities with verbal cues within 7 day(s).   Outcome: Progressing Towards Goal    OCCUPATIONAL THERAPY TREATMENT  Patient: Michael Lozano (85 y.o. male)  Date: 10/05/2019  Diagnosis: AKI (acute kidney injury) (HCC) [N17.9]  Orthostatic hypotension [I95.1]  Right foot ulcer (HCC) [L97.519] <principal problem not specified>       Precautions: Fall  Chart, occupational therapy assessment, plan of care, and goals were reviewed.    ASSESSMENT  Patient continues with skilled OT services and is progressing towards goals.  He is somewhat cantankerous, but is agreeable with encouragement and distraction. He is requiring overall Min A to S for ADL, but also requires  cues for safety and sequencing due to cognition. Continue to recommend rehab.          PLAN :  Patient continues to benefit from skilled intervention to address the above impairments.  Continue treatment per established plan of care to address goals.    Recommendation for discharge: (in order for the patient to meet his/her long term goals)  Therapy up to 5 days/week in SNF setting    This discharge recommendation:  Has been made in collaboration with the attending provider and/or case management    IF patient discharges home will need the following DME: Defer to facility       SUBJECTIVE:   Patient stated ???They're all so trifling.??? (cursing throughout, but is easily distracted)    OBJECTIVE DATA SUMMARY:   Cognitive/Behavioral Status:  Neurologic State: Alert;Confused  Orientation Level: Disoriented to time  Cognition: Follows commands;Impaired decision making;Memory loss;Poor safety awareness  Perception: Appears intact  Perseveration: Perseverates during conversation  Safety/Judgement: Decreased awareness of need for safety;Decreased awareness of need for assistance;Decreased insight into deficits    Functional Mobility and Transfers for ADLs:  Bed Mobility:  Supine to Sit: Supervision  Scooting: Supervision    Transfers:  Sit to Stand: Contact guard assistance     Bed to Chair: Contact guard assistance    Balance:  Sitting: Intact  Standing: Impaired  Standing - Static: Good  Standing - Dynamic : Fair;Constant support    ADL Intervention:         Upper Body Dressing Assistance  Hospital Gown: Minimum  assistance (to tie only)  Cues: Verbal cues provided;Visual cues provided  Lower Body Dressing Assistance  Socks: Supervision  Shoes with Cloth Laces: Moderate assistance;Maximum assistance  Leg Crossed Method Used: Yes  Position Performed: Seated edge of bed  Cues: Verbal cues provided;Visual cues provided    Patient completed some UE exercises (quickly) while seated in the chair, requiring visual and verbal  cues for proper technique. Exercises completed to increase strength and activity tolerance for ADL.    Cognitive Retraining  Safety/Judgement: Decreased awareness of need for safety;Decreased awareness of need for assistance;Decreased insight into deficits    Pain:  No complaints    Activity Tolerance:   Fair    After treatment patient left in no apparent distress:   Sitting in chair, Call bell within reach, and Bed / chair alarm activated    COMMUNICATION/COLLABORATION:   The patient???s plan of care was discussed with: Physical therapist and Registered nurse.     Casimiro Needle, OTR/L  Time Calculation: 26 mins

## 2019-10-05 NOTE — Progress Notes (Signed)
Progress Notes by Gwynneth Aliment., NP at 10/05/19 1506                Author: Gwynneth Aliment., NP  Service: Internal Medicine  Author Type: Nurse Practitioner       Filed: 10/05/19 1518  Date of Service: 10/05/19 1506  Status: Signed          Editor: Gwynneth Aliment., NP (Nurse Practitioner)               I reviewed pertinent labs and imaging, and discussed /agreed on the plan of care with Dr. Riley Kill.           Hospitalist Progress Note      NAME: Michael Lozano    DOB:  02-19-1934    MRN:  [id number]          Assessment / Plan:     Acute kidney injury likely prerenal   CKD stage 3   - Baseline Cr 1.2-1.5   -admitted Cr at 2.08 likely due to poor oral intake - pre renal    - Cr at 1.82 this morning 2.08 initially slowly trending    - cont gentle IV hydration    - monitor in am       Orthostatic hypotension due to poor oral intake   - orthostatic on admission but improved    - Sbp 100-116 no drop in systolic with position change    - will cont to monitor       Constipation -resolved    Abdominal pain likely due to constipation- resolved    - CT showed large amount of rectal stool , urinary bladder wall thickening    - pt had BM after SSE       Chronic right foot ulcer-    - no signs or erythema or tenderness    -appreciate wound care nurse - recc  Right plantar DM wound: MWF, cleanse with wound cleanser spray and 4x4, wipe clean. Apply a small amount  of Therahoney gel to a piece of Opticell AG and cover wound. Secure with dry 4x4 and tape.   - C/o nerve pain in bil foot states comes and goes -added Gabapentin 100 mg trial    - up walking with PT states pain improved       History of DVT    - not on chronic AC       Hypertension   GERD   sbp 99-120s    - will consider dc atenolol    -monitor  -   -Continue home PPI      Leukocytosis - resolved    -WBC 15.2  >> 12.8 , afebrile  No sign of infection    - Pro cal 0.22 normal    - no need for bx      Body mass index is 26.85 kg/m??.      Estimated  discharge date: August 21   Barriers: Insurance authorization       Code status: Full   Prophylaxis: Lovenox   Recommended Disposition: Home w/Family          Subjective:        Chief Complaint / Reason for Physician Visit   "my feet hurt  ".  Discussed with RN events overnight. Pt  Seen discussed wound care and management of pain . Pt anxious for discharge to SNF    Review of Systems:           Symptom  Y/N  Comments    Symptom  Y/N  Comments             Fever/Chills  n      Chest Pain  n               Poor Appetite  n      Edema  n               Cough  n      Abdominal Pain  n       Sputum  n      Joint Pain         SOB/DOE  n      Pruritis/Rash  n       Nausea/vomit  n      Tolerating PT/OT  n       Diarrhea  n      Tolerating Diet  y               Constipation        Other               Could NOT obtain due to:            Objective:        VITALS:    Last 24hrs VS reviewed since prior progress note. Most recent are:   Patient Vitals for the past 24 hrs:            Temp  Pulse  Resp  BP  SpO2            10/05/19 0800  98.7 ??F (37.1 ??C)  75  18  (!) 99/50  97 %            10/04/19 2253  98.2 ??F (36.8 ??C)  68  18  127/61  99 %     10/04/19 1920  --  --  --  (!) 116/52  --     10/04/19 1900  97.5 ??F (36.4 ??C)  (!) 54  22  (!) 103/51  98 %            10/04/19 1530  98.4 ??F (36.9 ??C)  64  20  (!) 116/57  96 %           Intake/Output Summary (Last 24 hours) at 10/05/2019 1507   Last data filed at 10/04/2019 1920     Gross per 24 hour        Intake  0 ml        Output  --        Net  0 ml            I had a face to face encounter and independently examined this patient on 10/05/2019, as outlined below:   PHYSICAL EXAM:   General: Thin malnourished  Alert, cooperative,   EENT:  EOMI. Anicteric sclerae. MMM missing teeth    Resp:   no wheezing or rales.  No accessory muscle use   CV:  S1S2,  No edema   GI:  Soft, Non distended, Non tender.  +Bowel sounds + flatus    Neurologic:  Alert and oriented X 3, normal speech,     Psych:   . Not anxious nor agitated   Skin:  No rashes.  No jaundice wound rot plantar R foot       Reviewed most current lab test results and cultures  YES   Reviewed most current radiology test results  YES   Review and summation of old records today    NO   Reviewed patient's current orders and MAR    YES   PMH/SH reviewed - no change compared to H&P   ________________________________________________________________________   Care Plan discussed with:           Comments         Patient  x           Family              RN  x       Care Manager             Consultant                                  Multidiciplinary team rounds were held today with case manager, nursing, pharmacist and Higher education careers adviser.  Patient's plan of care was discussed; medications  were reviewed and discharge planning was addressed.       ________________________________________________________________________   Total NON critical care TIME:  30 Minutes      Total CRITICAL CARE TIME Spent:   Minutes non procedure based              Comments         >50% of visit spent in counseling and coordination of care         ________________________________________________________________________   Nolon Stalls. Thomes Cake, NP       Procedures: see electronic medical records for all procedures/Xrays and details which were not copied into this note but were reviewed prior to creation of Plan.        LABS:   I reviewed today's most current labs and imaging studies.   Pertinent labs include:     Recent Labs             10/05/19   0053  10/04/19   0342  10/03/19   0504     WBC  12.8*  15.2*  15.4*     HGB  9.6*  10.2*  10.5*     HCT  28.9*  30.4*  32.6*          PLT  212  234  272          Recent Labs             10/05/19   0053  10/04/19   0342  10/03/19   0249     NA  136  137  137     K  3.9  3.8  3.9     CL  108  110*  112*     CO2  21  21  19*     GLU  107*  89  96     BUN  23*  23*  29*     CREA  1.82*  1.93*  1.84*     CA  9.3  9.0  9.1     ALB    --    --   3.3*     TBILI   --    --   0.6          ALT   --    --   43           Signed: Darrelle Barrell A. Thomes Cake, NP

## 2019-10-05 NOTE — Progress Notes (Signed)
 21: Day nurses unable to gain IV access.  PICC team was called, but d/t heavy case load they were unable to come see pt.  Nurse into attempt to gain IV access and draw blood work.  Yesterday pt was cursing at this RN and was clear that he did not want anymore needles.  This RN attempted to educate pt on need for IV and fluids.  Pt began raising voice and cursing at RN.  RN educated pt again.  Pt reluctant, but allowed RN to start IV.  Pt used foul language through the whole process of starting IV and obtaining lab work stating I'll be damned if they try to stick me with any more needles.  I'll just suffer being sick before I get stuck with another f#cking needle. This RN was able to start new IV, restart IV fluids, and obtain morning lab work.

## 2019-10-05 NOTE — Progress Notes (Signed)
 Problem: Mobility Impaired (Adult and Pediatric)  Goal: *Acute Goals and Plan of Care (Insert Text)  Description: FUNCTIONAL STATUS PRIOR TO ADMISSION: Patient was modified independent using a single point cane for functional mobility. But patient reports multiple recent falls and crawling on floor at times due to dizziness and fear of falling.     HOME SUPPORT PRIOR TO ADMISSION: The patient lived with granddaughter but reports she is never home. Reports he doesn't need assistance at baseline except for granddaughter drives him to appointments.     Physical Therapy Goals  Initiated 10/02/2019  1.  Patient will move from supine to sit and sit to supine , scoot up and down, and roll side to side in bed with modified independence within 7 day(s).    2.  Patient will transfer from bed to chair and chair to bed with modified independence using the least restrictive device within 7 day(s).  3.  Patient will perform sit to stand with modified independence within 7 day(s).  4.  Patient will ambulate with modified independence for 100 feet with the least restrictive device within 7 day(s).      Outcome: Progressing Towards Goal   PHYSICAL THERAPY TREATMENT  Patient: Michael Lozano (85 y.o. male)  Date: 10/05/2019  Diagnosis: AKI (acute kidney injury) (HCC) [N17.9]  Orthostatic hypotension [I95.1]  Right foot ulcer (HCC) [L97.519] <principal problem not specified>       Precautions: Fall  Chart, physical therapy assessment, plan of care and goals were reviewed.    ASSESSMENT  Patient continues with skilled PT services and is progressing towards goals. Pt cooperative but focused on complaints about food and care. He still exhibits some confusion stating facts differently with repeated report during session. He is supervision for bed mobility. He transfers with SBA to CGA with some extra time needed. He amb with SPC with CGA with wide based gait with flexed posture and increased lateral weight shift. He has no c/o dizziness  during session. He was able to progress to standing exercises with UE support: partial knee bends, marching, heel raises and hip abduction all x10 reps. Pt tolerated well.    Current Level of Function Impacting Discharge (mobility/balance): Overall supervision to CGA, uses SPC    Other factors to consider for discharge: lived alone, fall hx and continued fall risk         PLAN :  Patient continues to benefit from skilled intervention to address the above impairments.  Continue treatment per established plan of care.  to address goals.    Recommendation for discharge: (in order for the patient to meet his/her long term goals)  Therapy up to 5 days/week in SNF setting    This discharge recommendation:  Has been made in collaboration with the attending provider and/or case management    IF patient discharges home will need the following DME: patient owns DME required for discharge       SUBJECTIVE:   Patient stated "My food was cold."    OBJECTIVE DATA SUMMARY:   Critical Behavior:  Neurologic State: Alert, Confused  Orientation Level: Disoriented to time  Cognition: Follows commands, Impaired decision making, Memory loss, Poor safety awareness  Safety/Judgement: Decreased awareness of need for safety, Decreased awareness of need for assistance, Decreased insight into deficits  Functional Mobility Training:  Bed Mobility:     Supine to Sit: Supervision     Scooting: Supervision        Transfers:  Sit to Stand: Stand-by assistance;Additional time  Stand to Sit: Stand-by assistance        Bed to Chair: Contact guard assistance                    Balance:  Sitting: Intact  Standing: Impaired  Standing - Static: Good;Other (comment) (with at least unilateral UE support)  Standing - Dynamic : Fair;Other (comment) (with SPC)  Ambulation/Gait Training:  Distance (ft): 110 Feet (ft)  Assistive Device: Gait belt;Cane, straight  Ambulation - Level of Assistance: Contact guard assistance        Gait Abnormalities: Decreased step  clearance (flexed posture)              Speed/Cadence: Slow;Pace decreased (<100 feet/min)  Step Length: Right shortened;Left shortened            Activity Tolerance:   Fair    After treatment patient left in no apparent distress:   Sitting in chair, Call bell within reach, and Bed / chair alarm activated    COMMUNICATION/COLLABORATION:   The patient's plan of care was discussed with: Occupational therapist, Registered nurse, and Case management.     Michael Lozano, PT   Time Calculation: 26 mins

## 2019-10-05 NOTE — Progress Notes (Signed)
End of Shift Note    Bedside shift change report given to Melissa (oncoming nurse) by Rana Snare (offgoing nurse).  Report included the following information SBAR, Kardex, Intake/Output and Accordion    Shift worked:  7p-7a     Shift summary and any significant changes:     No change in pt condition.  PRN Tylenol twice for foot pain.  New IV started.  Fluids restarted.  Labs drawn.  Pt rested on and off. Pt ambulated with standby assistance twice to bathroom during night.      Concerns for physician to address:       Zone phone for oncoming shift:   [phone]       Activity:  Activity Level: Up with Assistance  Number times ambulated in hallways past shift: 0  Number of times OOB to chair past shift: 0    Cardiac:   Cardiac Monitoring: No      Cardiac Rhythm: Ventricular Paced    Access:   Current line(s): PIV     Genitourinary:   Urinary status: voiding    Respiratory:   O2 Device: None (Room air)  Chronic home O2 use?: NO  Incentive spirometer at bedside: NO     GI:  Last Bowel Movement Date: 10/03/19  Current diet:  ADULT DIET Regular  DIET ONE TIME MESSAGE  Passing flatus: YES  Tolerating current diet: YES       Pain Management:   Patient states pain is manageable on current regimen: NO    Skin:  Braden Score: 18  Interventions: increase time out of bed    Patient Safety:  Fall Score: Total Score: 2  Interventions: gripper socks, pt to call before getting OOB and stay with me (per policy)       Length of Stay:  Expected LOS: 2d 4h  Actual LOS: 3      Rana Snare

## 2019-10-05 NOTE — Progress Notes (Signed)
End of Shift Note    Bedside shift change report given to  (oncoming nurse) by Barbaraann Barthel, RN (offgoing nurse).  Report included the following information SBAR    Shift worked:  7a-7p     Shift summary and any significant changes:    Pt resting comfortably in room, completed wound care per orders, assisted in ADLs PRN   Concerns for physician to address:    Zone phone for oncoming shift:  [phone]     Activity:  Activity Level: Up with Assistance  Number times ambulated in hallways past shift: 0  Number of times OOB to chair past shift: 0    Cardiac:   Cardiac Monitoring: No      Cardiac Rhythm: Ventricular Paced    Access:   Current line(s): PIV     Genitourinary:   Urinary status: voiding    Respiratory:   O2 Device: None (Room air)  Chronic home O2 use?: NO  Incentive spirometer at bedside: NO     GI:  Last Bowel Movement Date: 10/03/19  Current diet:  ADULT DIET Regular  DIET ONE TIME MESSAGE  Passing flatus: YES  Tolerating current diet: YES       Pain Management:   Patient states pain is manageable on current regimen: YES    Skin:  Braden Score: 18  Interventions: float heels and increase time out of bed    Patient Safety:  Fall Score: Total Score: 2  Interventions: bed/chair alarm, assistive device (walker, cane, etc), gripper socks and pt to call before getting OOB       Length of Stay:  Expected LOS: 2d 4h  Actual LOS: 3      Barbaraann Barthel, RN

## 2019-10-05 NOTE — Progress Notes (Signed)
 Transition of Care Plan:    RUR:32% high risk  Disposition:Anticipating SNF-accepted by [facility]  COVID test:Pt has not been vaccinated, will need rapid COVID test resulted prior to d/c  Follow up appointments:PCP and specialist as indicated  DME needed:Pt has a cane.  Transportation at Discharge:Anticipating need for BLS  Keys or means to access home:Pt has access to home  IM Medicare Letter:Will need 2nd IMM Letter  Is patient a BCPI-A Bundle:N/A  If yes, was Bundle Letter given?:N/A  Caregiver Contact:Pt's daughter, Rock Blotter [phone]  Discharge Caregiver contacted prior to discharge?yes    Initial note: Chart reviewed. CM confirmed with Naomie from admissions at [facility] that the authorization process has been initiated and notified her of his negative Covid test.  CM will continue to monitor for any d/c needs.    Lucie Hillock, MSN

## 2019-10-05 NOTE — Progress Notes (Signed)
Problem: Falls - Risk of  Goal: *Absence of Falls  Description: Document Michael Lozano Fall Risk and appropriate interventions in the flowsheet.  Outcome: Progressing Towards Goal  Note: Fall Risk Interventions:  Mobility Interventions: Communicate number of staff needed for ambulation/transfer, Patient to call before getting OOB    Mentation Interventions: Door open when patient unattended, More frequent rounding, Reorient patient, Room close to nurse's station    Medication Interventions: Patient to call before getting OOB, Teach patient to arise slowly, Bed/chair exit alarm                   Problem: Patient Education: Go to Patient Education Activity  Goal: Patient/Family Education  Outcome: Progressing Towards Goal     Problem: Patient Education: Go to Patient Education Activity  Goal: Patient/Family Education  Outcome: Progressing Towards Goal     Problem: Patient Education: Go to Patient Education Activity  Goal: Patient/Family Education  Outcome: Progressing Towards Goal     Problem: Pain - Acute  Goal: *Control of acute pain  Outcome: Progressing Towards Goal     Problem: Patient Education: Go to Patient Education Activity  Goal: Patient/Family Education  Outcome: Progressing Towards Goal     Problem: Pressure Injury - Risk of  Goal: *Prevention of pressure injury  Description: Document Braden Scale and appropriate interventions in the flowsheet.  Outcome: Progressing Towards Goal  Note: Pressure Injury Interventions:  Sensory Interventions: Assess changes in LOC, Assess need for specialty bed, Keep linens dry and wrinkle-free, Maintain/enhance activity level, Minimize linen layers    Moisture Interventions: Absorbent underpads, Check for incontinence Q2 hours and as needed    Activity Interventions: Increase time out of bed, PT/OT evaluation    Mobility Interventions: HOB 30 degrees or less    Nutrition Interventions: Document food/fluid/supplement intake    Friction and Shear Interventions: Lift sheet,  Minimize layers                Problem: Patient Education: Go to Patient Education Activity  Goal: Patient/Family Education  Outcome: Progressing Towards Goal     Problem: Diabetic Ulcer-Treatment  Goal: *Improvement of existing diabetic ulcer  Outcome: Progressing Towards Goal     Problem: Patient Education: Go to Patient Education Activity  Goal: Patient/Family Education  Outcome: Progressing Towards Goal

## 2019-10-05 NOTE — Progress Notes (Signed)
Received notification from bedside RN about patient with regards to: requesting additional bowel regimen for persistent constipation    Intervention given: Dulcolax supp prn, peri-colace BID ordered    0206: does not want any bowel regimen that goes to his rectum, requesting Lactulose    - lactulose PO prn ordered

## 2019-10-06 LAB — METABOLIC PANEL, BASIC
Anion gap: 6 mmol/L (ref 5–15)
BUN/Creatinine ratio: 9 — ABNORMAL LOW (ref 12–20)
BUN: 14 MG/DL (ref 6–20)
CO2: 18 mmol/L — ABNORMAL LOW (ref 21–32)
Calcium: 8.7 MG/DL (ref 8.5–10.1)
Chloride: 113 mmol/L — ABNORMAL HIGH (ref 97–108)
Creatinine: 1.52 MG/DL — ABNORMAL HIGH (ref 0.70–1.30)
GFR est AA: 53 mL/min/{1.73_m2} — ABNORMAL LOW (ref >60)
GFR est non-AA: 44 mL/min/{1.73_m2} — ABNORMAL LOW (ref >60)
Glucose: 125 mg/dL — ABNORMAL HIGH (ref 65–100)
Potassium: 3.8 mmol/L (ref 3.5–5.1)
Sodium: 137 mmol/L (ref 136–145)

## 2019-10-06 LAB — BASIC METABOLIC PANEL
Anion Gap: 6 mmol/L (ref 5–15)
BUN: 14 MG/DL (ref 6–20)
Bun/Cre Ratio: 9 — ABNORMAL LOW (ref 12–20)
CO2: 18 mmol/L — ABNORMAL LOW (ref 21–32)
Calcium: 8.7 MG/DL (ref 8.5–10.1)
Chloride: 113 mmol/L — ABNORMAL HIGH (ref 97–108)
Creatinine: 1.52 MG/DL — ABNORMAL HIGH (ref 0.70–1.30)
EGFR IF NonAfrican American: 44 mL/min/{1.73_m2} — ABNORMAL LOW (ref >60)
GFR African American: 53 mL/min/{1.73_m2} — ABNORMAL LOW (ref >60)
Glucose: 125 mg/dL — ABNORMAL HIGH (ref 65–100)
Potassium: 3.8 mmol/L (ref 3.5–5.1)
Sodium: 137 mmol/L (ref 136–145)

## 2019-10-06 MED ORDER — SENNOSIDES-DOCUSATE SODIUM 8.6 MG-50 MG TAB
ORAL_TABLET | Freq: Two times a day (BID) | ORAL | 0 refills | Status: AC
Start: 2019-10-06 — End: 2019-11-05

## 2019-10-06 MED ORDER — LACTULOSE 10 GRAM/15 ML ORAL SOLUTION
10 gram/15 mL | Freq: Three times a day (TID) | ORAL | Status: DC | PRN
Start: 2019-10-06 — End: 2019-10-07
  Administered 2019-10-06: 07:00:00 via ORAL

## 2019-10-06 MED ORDER — GABAPENTIN 100 MG CAP
100 mg | ORAL_CAPSULE | Freq: Three times a day (TID) | ORAL | 0 refills | Status: AC
Start: 2019-10-06 — End: 2019-11-05

## 2019-10-06 MED ORDER — PANTOPRAZOLE 40 MG TAB, DELAYED RELEASE
40 mg | ORAL_TABLET | Freq: Every day | ORAL | 0 refills | Status: AC
Start: 2019-10-06 — End: 2019-11-06

## 2019-10-06 MED ORDER — BISACODYL 10 MG RECTAL SUPPOSITORY
10 mg | Freq: Every day | RECTAL | Status: DC | PRN
Start: 2019-10-06 — End: 2019-10-07
  Administered 2019-10-06: 07:00:00 via RECTAL

## 2019-10-06 MED ORDER — SENNOSIDES-DOCUSATE SODIUM 8.6 MG-50 MG TAB
Freq: Two times a day (BID) | ORAL | Status: DC
Start: 2019-10-06 — End: 2019-10-07
  Administered 2019-10-06 – 2019-10-07 (×4): via ORAL

## 2019-10-06 MED FILL — LACTULOSE 20 GRAM/30 ML ORAL SOLUTION: 20 gram/30 mL | ORAL | Qty: 30

## 2019-10-06 MED FILL — GABAPENTIN 100 MG CAP: 100 mg | ORAL | Qty: 1

## 2019-10-06 MED FILL — BISACODYL 10 MG RECTAL SUPPOSITORY: 10 mg | RECTAL | Qty: 1

## 2019-10-06 MED FILL — DOK PLUS 8.6 MG-50 MG TABLET: ORAL | Qty: 1

## 2019-10-06 MED FILL — ENOXAPARIN 40 MG/0.4 ML SUB-Q SYRINGE: 40 mg/0.4 mL | SUBCUTANEOUS | Qty: 0.4

## 2019-10-06 MED FILL — PANTOPRAZOLE 40 MG TAB, DELAYED RELEASE: 40 mg | ORAL | Qty: 1

## 2019-10-06 NOTE — Progress Notes (Signed)
Bedside and Verbal shift change report given to Annice Pih (Cabin crew) by Percell Belt (offgoing nurse). Report included the following information SBAR, Kardex, MAR and Recent Results.

## 2019-10-06 NOTE — Progress Notes (Signed)
End of Shift Note    Bedside shift change report given to Sam/Ariana (oncoming nurse) by Sidney Ace (offgoing nurse).  Report included the following information SBAR, Kardex, ED Summary, Intake/Output, MAR, Accordion and Recent Results    Shift worked:  night     Shift summary and any significant changes:    Pt very constipated and agitated during most of the shift. Multiple medications to promote bowel movement were given (see MAR). PT finally had a Bm around 5am and has been much more pleasant since his release. No labs to be drawn during shift.   Concerns for physician to address:    Zone phone for oncoming shift:       Activity:  Activity Level: Up with Assistance  Number times ambulated in hallways past shift: 0  Number of times OOB to chair past shift: 2    Cardiac:   Cardiac Monitoring: No      Cardiac Rhythm: Ventricular Paced    Access:   Current line(s): PIV     Genitourinary:   Urinary status: voiding    Respiratory:   O2 Device: None (Room air)  Chronic home O2 use?: NO  Incentive spirometer at bedside: NO     GI:  Last Bowel Movement Date: 10/03/19  Current diet:  ADULT DIET Regular  DIET ONE TIME MESSAGE  Passing flatus: YES  Tolerating current diet: YES       Pain Management:   Patient states pain is manageable on current regimen: YES    Skin:  Braden Score: 18  Interventions: increase time out of bed    Patient Safety:  Fall Score: Total Score: 2  Interventions: bed/chair alarm, assistive device (walker, cane, etc), gripper socks and pt to call before getting OOB       Length of Stay:  Expected LOS: 2d 4h  Actual LOS: 4      Shayla Paige

## 2019-10-06 NOTE — Progress Notes (Signed)
Pt very rude and cursing due to not being able to move his bowels. Pt informed that we were doing all we could do for him and awaiting pharmacy verification for an additional medication to move his bowels.

## 2019-10-06 NOTE — Progress Notes (Signed)
Progress Notes by Gwynneth Aliment., NP at 10/06/19 1719                Author: Gwynneth Aliment., NP  Service: Internal Medicine  Author Type: Nurse Practitioner       Filed: 10/06/19 1724  Date of Service: 10/06/19 1719  Status: Signed          Editor: Gwynneth Aliment., NP (Nurse Practitioner)               I reviewed pertinent labs and imaging, and discussed /agreed on the plan of care with Dr. Arvid Right            Hospitalist Progress Note      NAME: Michael Lozano    DOB:  1934/05/05    MRN:  [id number]          Assessment / Plan:     Acute kidney injury likely prerenal   CKD stage 3   - Baseline Cr 1.2-1.5   -admitted Cr at 2.08 likely due to poor oral intake    - Cr at baseline of 1.52    - cont gentle IV hydration    - monitor in am       Orthostatic hypotension due to poor oral intake- resolved    - orthostatic on admission but improved    - Sbp 100-116 no drop in systolic with position change    - will cont to monitor       Constipation -resolved    Abdominal pain likely due to constipation- resolved    - CT showed large amount of rectal stool , urinary bladder wall thickening    - pt had BM after SSE       Chronic right foot ulcer-    - no signs or erythema or tenderness    -appreciate wound care nurse - recc  Right plantar DM wound: MWF, cleanse with wound cleanser spray and 4x4, wipe clean. Apply a small amount  of Therahoney gel to a piece of Opticell AG and cover wound. Secure with dry 4x4 and tape.   - C/o nerve pain in bil foot states comes and goes -added Gabapentin 100 mg trial    - attempted to DC pt home today however no HH agency available to assist with wound care . Attempted to call dtr Bonita Quin and Fredderick Phenix to discuss wound care needs . No answer . Will cancel DC for today       History of DVT    - not on chronic AC       Hypertension   GERD   sbp 99-120s    - dc atenolol  BP normotensive    -monitor  -   -Continue home PPI      Leukocytosis - resolved    -WBC 15.2  >>  12.8 , afebrile  No sign of infection    - Pro cal 0.22 normal    - no need for bx      Body mass index is 26.85 kg/m??.      Estimated discharge date: August 21   Barriers: Insurance authorization denied       Code status: Full   Prophylaxis: Lovenox   Recommended Disposition: Home w/Family          Subjective:        Chief Complaint / Reason for Physician Visit   "I finally had a BM   ".  Discussed with RN events overnight. Pt  Seen discussed wound care and management of pain .discussed SNF  Denial , unable to secure HH , wilo DC to home        Review of Systems:           Symptom  Y/N  Comments    Symptom  Y/N  Comments             Fever/Chills  n      Chest Pain  n               Poor Appetite  n      Edema  n               Cough  n      Abdominal Pain  n       Sputum  n      Joint Pain         SOB/DOE  n      Pruritis/Rash  n       Nausea/vomit  n      Tolerating PT/OT  n       Diarrhea  n      Tolerating Diet  y               Constipation        Other               Could NOT obtain due to:            Objective:        VITALS:    Last 24hrs VS reviewed since prior progress note. Most recent are:   Patient Vitals for the past 24 hrs:            Temp  Pulse  Resp  BP  SpO2            10/06/19 1614  98.4 ??F (36.9 ??C)  60  18  (!) 104/44  99 %            10/06/19 0754  98.3 ??F (36.8 ??C)  64  18  (!) 113/48  99 %     10/05/19 2339  97.9 ??F (36.6 ??C)  66  18  (!) 147/69  98 %            10/05/19 2104  98 ??F (36.7 ??C)  61  18  (!) 142/60  100 %        No intake or output data in the 24 hours ending 10/06/19 1719       I had a face to face encounter and independently examined this patient on 10/06/2019, as outlined below:   PHYSICAL EXAM:   General: Thin malnourished  Alert, cooperative,   EENT:  EOMI. Anicteric sclerae. MMM missing teeth    Resp:   no wheezing or rales.  No accessory muscle use   CV:  S1S2,  tr edema   GI:  Soft, Non distended, Non tender.  +Bowel sounds + flatus    Neurologic:  Alert and oriented X 3,  normal speech,    Psych:   . Not anxious nor agitated   Skin:  No rashes.  No jaundice wound rot plantar R foot       Reviewed most current lab test results and cultures  YES   Reviewed most current radiology test results   YES   Review and summation of old records today    NO   Reviewed patient's current orders and MAR  YES   PMH/SH reviewed - no change compared to H&P   ________________________________________________________________________   Care Plan discussed with:           Comments         Patient  x           Family              RN  x       Care Manager             Consultant                                  Multidiciplinary team rounds were held today with case manager, nursing, pharmacist and clinical coordinator.  Patient's plan of care was discussed; medications  were reviewed and discharge planning was addressed.       ________________________________________________________________________   Total NON critical care TIME:  30 Minutes      Total CRITICAL CARE TIME Spent:   Minutes non procedure based              Comments         >50% of visit spent in counseling and coordination of care         ________________________________________________________________________   Nolon Stalls. Thomes Cake, NP       Procedures: see electronic medical records for all procedures/Xrays and details which were not copied into this note but were reviewed prior to creation of Plan.        LABS:   I reviewed today's most current labs and imaging studies.   Pertinent labs include:     Recent Labs            10/05/19   0053  10/04/19   0342     WBC  12.8*  15.2*     HGB  9.6*  10.2*     HCT  28.9*  30.4*         PLT  212  234          Recent Labs             10/06/19   1026  10/05/19   0053  10/04/19   0342     NA  137  136  137     K  3.8  3.9  3.8     CL  113*  108  110*     CO2  18*  21  21     GLU  125*  107*  89     BUN  14  23*  23*     CREA  1.52*  1.82*  1.93*          CA  8.7  9.3  9.0           Signed: Kela Baccari A.  Thomes Cake, NP

## 2019-10-06 NOTE — Progress Notes (Signed)
Problem: Falls - Risk of  Goal: *Absence of Falls  Description: Document Michael Lozano Fall Risk and appropriate interventions in the flowsheet.  Outcome: Progressing Towards Goal  Note: Fall Risk Interventions:  Mobility Interventions: Bed/chair exit alarm, Communicate number of staff needed for ambulation/transfer, Patient to call before getting OOB    Mentation Interventions: Adequate sleep, hydration, pain control, Bed/chair exit alarm    Medication Interventions: Bed/chair exit alarm, Patient to call before getting OOB, Teach patient to arise slowly                   Problem: Pain - Acute  Goal: *Control of acute pain  Outcome: Progressing Towards Goal     Problem: Pressure Injury - Risk of  Goal: *Prevention of pressure injury  Description: Document Braden Scale and appropriate interventions in the flowsheet.  Outcome: Progressing Towards Goal  Note: Pressure Injury Interventions:  Sensory Interventions: Assess changes in LOC    Moisture Interventions: Absorbent underpads    Activity Interventions: Increase time out of bed    Mobility Interventions: Float heels, HOB 30 degrees or less, Turn and reposition approx. every two hours(pillow and wedges)    Nutrition Interventions: Document food/fluid/supplement intake    Friction and Shear Interventions: Apply protective barrier, creams and emollients

## 2019-10-06 NOTE — Progress Notes (Signed)
 Transition of Care Plan:    RUR:34% high risk  Disposition:Anticipating home with home health  COVID test:negative  Follow up appointments:PCPand specialist as indicated  DME needed:Pt has a cane.  Transportation at Discharge:Anticipating granddaughter to transport  Keys or means to access home:Pt has access to home  IM Medicare Letter:Will need 2nd IMM Letter  Is patient a BCPI-A Bundle:N/A  If yes, was Bundle Letter given?:N/A  Caregiver Contact:Pt's daughter, Rock Blotter [phone]  Discharge Caregiver contacted prior to discharge?yes    Update 3:10: CM updated NP about not being able to secure [facility] services at this time. Per NP d/c will be canceled and reevaluated for possible d/c tomorrow. CM placed call to patient's granddaughter to inform her.    Update 1:05 : [facility] cannot accept. Referrals sent to [facility] ( Denied), [address] [facility]( Denied), and [facility]. Awaiting confirmation.     Update: CM received call from [facility],  Pt's insurance not accepted. CM placed call to patient's daughter Rock Blotter to inform of [facility] not accepting. CM asked permission to send referrals to agencies who may accept pt's insurance. Pt's daughter agreed. CM informed patient's daughter of d/c order placed and asked if pt's granddaughter would still be available to transport. Per pt's daughter she will call pt's granddaughter and see and will call CM back to let CM know. Referral for [facility] sent to [facility].  Awaiting confirmation.     Intiial note 11:30 am CM reviewed chart. Patient has d/c order placed for today. Per last CM note patient was not approved to go to SNF and HHC was recommended. Pt daughter preferred [facility] for home health. This CM placed referral to [facility]. Patient has specialized Medicare plan so CM placed call to [facility] on call service to inquire if agency accepts  insurance. CM awaiting return call from on call admin from [facility].     Lora Ricker, BSN, RN, Armed forces technical officer

## 2019-10-07 MED ORDER — AQUACEL AG FOAM 1.2 %-5" X 5" BANDAGE
1.2 %- 5" X 5" | CUTANEOUS | 0 refills | Status: AC
Start: 2019-10-07 — End: 2019-11-06

## 2019-10-07 MED FILL — ACETAMINOPHEN 500 MG TAB: 500 mg | ORAL | Qty: 1

## 2019-10-07 MED FILL — PANTOPRAZOLE 40 MG TAB, DELAYED RELEASE: 40 mg | ORAL | Qty: 1

## 2019-10-07 MED FILL — ENOXAPARIN 40 MG/0.4 ML SUB-Q SYRINGE: 40 mg/0.4 mL | SUBCUTANEOUS | Qty: 0.4

## 2019-10-07 MED FILL — GABAPENTIN 100 MG CAP: 100 mg | ORAL | Qty: 1

## 2019-10-07 MED FILL — DOK PLUS 8.6 MG-50 MG TABLET: ORAL | Qty: 1

## 2019-10-07 NOTE — Progress Notes (Signed)
Bedside and Verbal shift change report given to [facility] RN (oncoming nurse) by Durwin Reges (offgoing nurse). Report included the following information SBAR, Kardex, Intake/Output, MAR and Recent Results. Patient awake most of night complaining of hunger, numerous snacks given with patient still complaining of hunger without appeasement and patient very upset that more food should be available for patient if needed.

## 2019-10-07 NOTE — Progress Notes (Signed)
 Talked to granddaughter, Michael Lozano for discharge plan and wound offered wound dressing education  Michael Lozano, Michael Lozano arranged an Michael Lozano for patient's discharge; Michael Lozano stated  will be home when he(pt) arrives     PIV removed  Discharge education given to the patient at the bedside and explained to Michael Lozano by phone.   Patient verbalized his understanding and signed on the discharge instruction

## 2019-10-07 NOTE — Discharge Summary (Signed)
Discharge Summary by Gwynneth Aliment., NP at 10/07/19 1128                Author: Gwynneth Aliment., NP  Service: Internal Medicine  Author Type: Nurse Practitioner       Filed: 10/07/19 1151  Date of Service: 10/07/19 1128  Status: Signed           Editor: Gwynneth Aliment., NP (Nurse Practitioner)  Cosigner: Shelda Jakes, DO at 10/23/19 [phone]                                       Hospitalist Discharge Summary        Patient ID:   Michael Lozano   [id number]   85 y.o.   1934-03-15   10/01/2019      PCP on record: Jenkins Rouge, NP      Admit date: 10/01/2019   Discharge date and time: 10/07/2019      DISCHARGE DIAGNOSIS:   Acute kidney injury likely prerenal   CKD stage 3   Orthostatic hypotension due to poor oral intake-   Chronic right foot ulcer-    History of DVT   Hypertension   GERD   Leukocytosis      CONSULTATIONS:   IP CONSULT TO HOSPITALIST      Excerpted HPI from H&P of Vena Austria, MD:   CHIEF COMPLAINT: Abdominal pain   ??   HISTORY OF PRESENT ILLNESS:      Michael Lozano is a 85 y.o.   male who presents with past medical is of hypertension, gastroesophageal flux disease, diabetes mellitus is coming the hospital chief complaints of abdominal pain, nausea and also constipation.  Patient reports that  he currently lives alone and sometimes granddaughter comes and visits him but lately has been going through a difficult case.  Is not able to ambulate and get things done.  He has not been eating or drinking well lately.  He reports abdominal pain  which is generalized, 3 x 10, nonradiating, without any aggravating or relieving factors.  He reports nausea but no vomiting.  Reports constipation since last 6 days.  Also reports right foot ulcer along with pain which is chronic.  He also reports  some dizziness mostly upon standing.  Also reports headache as well.   ??   On arrival to ED, vital signs are within normal limits.On labs CBC is normal.BMP shows a creatinine of  2.08.  LFTs are normal.  Troponin 0 0.05.   ??   We were asked to admit for work up and evaluation of the above problems.    ??      ______________________________________________________________________   DISCHARGE SUMMARY/HOSPITAL COURSE:   for full details see H&P, daily progress notes, labs, consult notes.       Michael Fortune85 y.o.M was admitted to [facility] on 10/01/2019  and treated for the following medical complaints:       Acute kidney injury likely prerenal   CKD stage 3   - Baseline Cr 1.2-1.5   -admitted Cr at 2.08 likely due to poor oral intake - pre renal    - Cr at 1.52 this morning improved from 2.08      ??   Orthostatic hypotension due to poor oral intake-   - orthostatic on admission but improved    - Sbp 100-116 no drop in systolic with position  change       ??   Constipation -resolved    Abdominal pain likely due to constipation- resolved    - CT showed large amount of rectal stool , urinary bladder wall thickening    - pt had BM after SSE    ??   Chronic right foot ulcer-    - no signs or erythema or tenderness    -appreciate wound care nurse - recc     Right plantar DM wound: MWF, cleanse with wound cleanser spray and 4x4, wipe clean. Apply a small amount of Therahoney gel to a piece of Opticell AG and cover wound. Secure with dry 4x4 and tape.   - C/o nerve pain in bil foot states comes and goes -added Gabapentin 100 mg trial    - up walking with PT states pain improved    ??   History of DVT    - not on chronic AC    ??   Hypertension   GERD   sbp 99-120s    -dc atenolol normotensive    -Continue home PPI   ??   Leukocytosis - resolved    -WBC 15.2  >> 12.8 , afebrile  No sign of infection    - Pro cal 0.22 normal    - no need for bx   ??   Case management unable to secure [facility] agency for pt . Will DC home with Family to do wound care . Will attempt to secure wound care clinic for pt . Will notify family  if able to .       Patient's plan of care has been reviewed with them.  Patient and daughter Bonita Quin   have verbally conveyed their understanding and agreement of the patient's signs, symptoms, diagnosis, treatment and prognosis and additionally agree to follow up as recommended  or return to [facility] should their condition change prior to follow-up.  Discharge instructions have also been provided to the patient with some educational information regarding their diagnosis as well a list of reasons why they  would want to return to the office prior to their follow-up appointment should their condition change.         _______________________________________________________________________   Patient seen and examined by me on discharge day.   Pertinent Findings:   Gen:    Not in distress   Chest: Clear lungs   CVS:   Regular rhythm.  No edema   Abd:  Soft, not distended, not tender   Neuro:  Alert, Oriented x3    _______________________________________________________________________   DISCHARGE MEDICATIONS:      Current Discharge Medication List              START taking these medications          Details        silver-foam bandage (Aquacel AG Foam) 1.2 %- 5" X 5" bndg  1 Box by Apply Externally route Every Mon, Wed & Sun for 30 days.   Qty: 10 Each, Refills:  0   Start date: 10/07/2019, End date:  11/06/2019          Associated Diagnoses: Ulcer of right foot with fat layer exposed (HCC)               gabapentin (NEURONTIN) 100 mg capsule  Take 1 Capsule by mouth three (3) times daily for 30 days. Max Daily Amount: 300 mg. Indications: neuropathic pain   Qty: 90 Capsule, Refills:  0  Start date: 10/06/2019, End date:  11/05/2019          Associated Diagnoses: Neuropathy               senna-docusate (PERICOLACE) 8.6-50 mg per tablet  Take 1 Tablet by mouth two (2) times a day for 30 days. Indications: constipation   Qty: 60 Tablet, Refills:  0   Start date: 10/06/2019, End date:  11/05/2019               polyethylene glycol (Miralax) 17 gram/dose powder  Take 17 g by mouth daily.   Qty: 289 g, Refills:   0   Start date: 10/02/2019                     CONTINUE these medications which have CHANGED          Details        pantoprazole (PROTONIX) 40 mg tablet  Take 1 Tablet by mouth Daily (before breakfast) for 30 days. Indications: indigestion   Qty: 30 Tablet, Refills:  0   Start date: 10/07/2019, End date:  11/06/2019                     CONTINUE these medications which have NOT CHANGED          Details        atorvastatin (LIPITOR) 40 mg tablet  Take 40 mg by mouth daily.               acetaminophen (TYLENOL) 325 mg tablet  Take 2 Tablets by mouth every four (4) hours as needed for Pain.   Qty: 20 Tablet, Refills:  0                     STOP taking these medications                  omeprazole (PRILOSEC) 20 mg capsule  Comments:    Reason for Stopping:                                Patient Follow Up Instructions:    Activity: Activity as tolerated   Diet: Resume previous diet   Wound Care:  MWF, cleanse with wound cleanser spray and 4x4, wipe clean. Apply a small amount of Therahoney gel to  a piece of Opticell AG and cover wound. Secure with dry 4x4 and tape.      Follow-up with PCP in 1 week.   Follow-up tests/labs         Follow-up Information               Follow up With  Specialties  Details  Why  Contact Info              Jenkins Rougeummings, Mallory E, NP  Nurse Practitioner      [address]   [place] [id number]   [phone]                 Jenkins Rougeummings, Mallory E, NP  Nurse Practitioner      [address]   [place] [id number]   [phone]                ________________________________________________________________      Risk of deterioration: Low      Condition at Discharge:  Stable   __________________________________________________________________      Disposition  Home with family, no needs      ____________________________________________________________________      Code Status: Full Code   ___________________________________________________________________         Total time in minutes  spent coordinating this discharge (includes going over instructions, follow-up, prescriptions, and preparing report for sign off to her PCP) :  35 minutes      Signed:   Ajai Terhaar A. Thomes Cake, NP

## 2019-10-07 NOTE — Progress Notes (Signed)
Problem: Falls - Risk of  Goal: *Absence of Falls  Description: Document Michael Lozano Fall Risk and appropriate interventions in the flowsheet.  Outcome: Progressing Towards Goal  Note: Fall Risk Interventions:  Mobility Interventions: Communicate number of staff needed for ambulation/transfer, Patient to call before getting OOB, PT Consult for mobility concerns    Mentation Interventions: Adequate sleep, hydration, pain control, Door open when patient unattended    Medication Interventions: Patient to call before getting OOB, Teach patient to arise slowly    Elimination Interventions: Call light in reach, Patient to call for help with toileting needs              Problem: Patient Education: Go to Patient Education Activity  Goal: Patient/Family Education  Outcome: Progressing Towards Goal     Problem: Patient Education: Go to Patient Education Activity  Goal: Patient/Family Education  Outcome: Progressing Towards Goal

## 2019-10-07 NOTE — Progress Notes (Signed)
 Transition of Care Plan:    RUR:32% high risk  Disposition:Anticipatinghome with family support, CM unable to secure HHC  COVID test:negative  Follow up appointments:PCPand specialist as indicated  DME needed:Pt has a cane.  Transportation at Discharge:Anticipating granddaughter to transport  Keys or means to access home:Pt has access to home  IM Medicare Letter:Will need 2nd IMM Letter  Is patient a BCPI-A Bundle:N/AIf yes, was Bundle Letter given?:N/A  Caregiver Contact:Pt's daughter, Rock Blotter [phone]  Discharge Caregiver contacted prior to discharge?yes             CM spoke with NP about pt d/c today. NP tried to reach patient's daughter and granddaughter yesterday to discuss if family would be able to assist with wound care for patient.NP got no response.  This CM placed call to pt's granddaughter, left secure VM for return call back. CM also placed call to pt's daughter Rock, unable to leave VM.     Lora Ricker, BSN, RN, Armed forces technical officer

## 2019-10-08 NOTE — Progress Notes (Signed)
 Initial Contact Social Work Note - Ambulatory  10/08/2019      Date of referral: 10/08/2019  Referral received from: Glendale Birmingham, RN, Care Transitions Nurse   Reason for referral: Food insecurity, transportation    Previous SW Referral: No      Two Identifiers Verified: Yes    Insurance Provider:  Per chart - [employer];  Patient states [employer]    Support System: Adult Child/Children and granddaughter    Daughter:   Rock Blotter  [phone]  Granddaughter:  Avi Blotter  [phone]     Veteran Status: N/A    Community Providers: Unknown at this time     ADL Assistance: Patient reported ability to ambulate, shower without assistance    Housing/Living Concerns or Home Modification Needs:  None at this time    Transportation Concern:   Patient does not have transportation     Medication Cost Concern:   Unknown, patient insurance coverage unclear.  Patient stated that he was not worried about his medications    Medication Education or Adherence Concern:   Patient stated that he was not worried about his medications.   Concern noted for compliance.     Financial Concern(s): Patient was unable to provide monthly income information.  He stated that it goes straight to the bank.      Income (only if applicable): unknown     Ability to Read/Write: No   Patient stated that he cannot read.       Advance Care Plan:  Deferred until future conversation    Other:   Referral received for assistance with food insecurity, transportation issues, home health concerns.  Chart reviewed and patient status discussed with Glendale Birmingham RN, CTN.     Contacted patient for initial assessment.  Introduced self and explained purpose of call.  Patient stated that he is not feeling too good.  He stated that his granddaughter lives with him, but is never at the home. Patient stated that he has 2 daughters - one Joya) nearby and another that just moved to [place] . Verbal permission granted by patient for SW to contact  family members.  Patient advised that he does not have access to food/drinking water.  Per patient, the house has water but he can't drink it.  Confirmed that patient has A/C.  SW asked about family support.  He stated that he has not seen his family and stated that people don't give a damn.   Patient stated that he wasn't worried about the lack of food because 'I'm dying anyway and I'll just lay here and die.  Asked about medications.  Patient stated that he was not worried about them. SW asked patient if he is involved with social services or other supporting agencies.  Patient stated that he has worked with someone, but was not sure of who and indicated that they have not been to see him since he has been home.  Patient appeared frustrated during call, cursing frequently, and ended call suddenly with SW.      SW attempted to reach daughter and granddaughter.  Unable to leave VM on daughter's phone - voicemail full.  Message left on granddaughter's VM requesting return call.      2:40 pm  Contacted [employer] of Social Services and completed Adult [facility] report with intake.  Advised of multiple hospitalizations, ED visits, many of which appear related to food insecurity. (Per [facility] form under Media, patient also seen on [facility] at [facility] with [facility], initial encounter listed as one of diagnoses) Voiced concern with possibility of depression, self-neglect, and limited family/friend support.     Identified Needs:     . Food Insecurity  . Transportation  . Home health  . Home evaluation   . Clarification of insurance    Social Work Plan:    . Adult Protective Services report made 10/08/19  . Follow-up with daughter, granddaughter re: patient needs and ability to assist  . Review resources available through insurance      Goals Addressed                 This Visit's Progress       Chronic Disease    . Supportive care in-home including food, medication,  and transportation        10/08/19  Patient experiencing food insecurity, minimal family support, lack of transportation, and need for support in home.  Adult Protective Services report filed with [facility] due to lack of support, concern for self-neglect.   SW Plan:  Follow-up with insurance re: resources, family, patient   AML        Rosina Schmitz, MSW, ACSW, [facility]  Social Worker  Ambulatory Care Management   [phone]

## 2019-10-08 NOTE — Progress Notes (Signed)
CTN contacted [facility].  Was advised they could assist with finding home health care for patient.  Would need doctor's order and authorization request form. Form on their website: [email or web address]  Would need order and form faxed to [phone].  CTN sent message to Hospital CM, [facility]ir requesting order and form for Home Health for this patient.

## 2019-10-08 NOTE — Progress Notes (Signed)
 Care Transitions Initial Call    Call within 2 business days of discharge: Yes     Patient: Michael Lozano Patient DOB: 1934-04-23 MRN: [id number]    Last Discharge [facility]       Complaint Diagnosis Description Type Department Provider    10/01/19 Abdominal Pain Neuropathy ... ED to Hosp-Admission (Discharged) (ADMIT) [id number] Willim Lonni HERO, DO; O'Bi...          Was this an external facility discharge? No     Challenges to be reviewed by the provider   Additional needs identified to be addressed with provider: yes  home health care- Hospital CM unable to arrange Home Health Care, medications- Patient rode home in cab from hospital-unable to get new meds., transportation- no transportation to appointments, advance care planning- no ACP on file and personal care aide- needs eval for personal care aide.   Patient states family lives about 60 miles away and has no contact with him.  Can't read or write very well patient states.  LM for daughter, no response.       Method of communication with provider : chart routing    Discussed COVID-19 related testing which was available at this time.   Test results were negative.   Patient informed of results, if available? yes     Advance Care Planning:   Does patient have an Advance Directive:  currently not on file; patient declined education    Inpatient Readmission Risk score: Unplanned Readmit Risk Score: 32    Was this a readmission? no   Patient stated reason for the admission: abdominal pain, foot pain,constipation    Patients top risk factors for readmission: depression, financial, functional physical ability, ineffective coping, level of motivation, medical condition-AKI/CKD3/chronic right foot ulcer/orthostatic hypotension d/t poor oral intake/HTN., medication management, support system and transportation   Interventions to address risk factors: Scheduled appointment with PCP-CTN to call pcp office for TOC appt. Will see if [facility] goes to his area.  and Obtained and reviewed discharge summary and/or continuity of care documents. [facility] out of service area. Have LM with pcp office.    Care Transition Nurse (CTN) contacted the patient by telephone to perform post hospital discharge assessment. Verified name and DOB with patient as identifiers. Provided introduction to self, and explanation of the CTN role. Patient says he needs help- has no food in house-family lives 60 miles away and doesn't care about him. Cab brought him home from [facility] and did not get new meds. Says he is able to walk a little bit. Can't read or write very well. Gave CTN permission to call daughter,Linda, to discuss. CTN called her, LM requesting call back. Out of service area for [facility]. Called other contact, listed as EC, no answer/busy. LM for Aishai ph [phone]- no return call. Contacted [employer], was told he is not one of their members. Will continue to try to contact family members and Home Health companies.     CTN did not review discharge instructions, medical action plan and red flags with patient -he was focused on needing help and needing food. . Were discharge instructions available to patient? Yes.but admits that he has difficulty with reading and writing.  Reviewed appropriate site of care based on symptoms and resources available to patient including: PCP, Urgent Care Clinics and When to call 911. Patient given an opportunity to ask questions and does  Have further questions and concerns at this time. Does not have transportation, no food, does not  have new meds.      Medication reconciliation was performed with patient, who verbalizes understanding of administration of home medications. Advised obtaining a 90-day supply of all daily and as-needed medications. Note- patient unable to get new meds, got ride home from hospital in a cab.   Referral to Pharm D needed: no     Home Health/Outpatient orders at discharge: none - hospital case manager unable to  find participating [facility] company to service his address.   Home Health company: unable to arrange  Date of initial visit: na    Durable Medical Equipment ordered at discharge: none  Durable Medical Equipment Company: na  Durable Medical Equipment received: na    Covid Risk Education    Educated patient about risk for severe COVID-19 due to risk factors according to CDC guidelines. CTN reviewed discharge instructions, medical action plan and red flag symptoms with the patient who verbalized understanding. Discussed COVID vaccination status: yes. Education provided on COVID-19 vaccination as appropriate. Discussed exposure protocols and quarantine with CDC Guidelines. Patient was given an opportunity to verbalize any questions and concerns and agrees to contact CTN or health care provider for questions related to their healthcare.    Was patient discharged with a pulse oximeter? no.     Discussed follow-up appointments. If no appointment was previously scheduled, appointment scheduling offered: yes. Is follow up appointment scheduled within 7 days of discharge? CTN to call and request visit with [facility]. Out of service area. Called and LM at pcp office.  [facility] follow up appointment(s): No future appointments.  Non-[facility] follow up appointment(s): na    Plan for follow-up call in 3-5 days based on severity of symptoms and risk factors.  Plan for next call: symptom management-assess current symptoms, self management-following discharge instructions, follow up appointment-scheduled to see pcp or [facility], medication management-got new meds and taking as ordered. and community resources-Meals on wheels/senior connections.  CTN provided contact information for future needs.    Goals Addressed                 This Visit's Progress    . Supportive resources in place to maintain patient in the community (ie. Home Health, DME equipment, refer to, medication assistant plan, etc.)        10/08/19 [facility] 8/16-8/22  AKI/CKD3/orthostatic hypotension d/t poor intake/chronic right foot ulcer.  . Patient upset when CTN called- I have no food in house. My family lives 60 miles away and does not care about me.  [facility] Hospital CM unable to arrange for Home Health (? Due to address-lives in [place])  . CTN to request SW assist patient with Meals on Wheels and [employer].  . Patient gave permission for CTN to contact daughter, called her and LM.  [facility] Has not gotten new meds, came home in cab, no transportation.  . Out of service area for [facility].  . Reports he can walk a bit. Using cane or walker.  [facility] Lines CTN he can't read or write very well.   . Advised him will reach out to daughter, SW and other community resources on his behalf. Will check back in the next day or so.mbt

## 2019-10-08 NOTE — Progress Notes (Signed)
CTN called insurance to check on Home Health companies .  [place] [employer] Medicare HMO ph [phone]. Member ID # [id number].  Was told by insurance, do not have participating [facility] providers in his area.  Requested they make an exception since he needs this service for wound care. Was told they could authorize a one visit exception. Transferred me to recert nurse. Ph [phone]. Fax # [phone]. CTN called and LM requesting call back to discuss.    [facility] to speak to pcp, NP Wyvonna Plum.  Was told she is now at [facility], ph [phone].  Per [place] Evener report, patient saw NP Eddie Candle at [place] in September 2020.  Was supposed to see her again in Jan 2021 but missed the appt.  Called [employer], [place].    Called [employer], [place] [phone]. 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

## 2019-10-09 NOTE — Progress Notes (Signed)
Called patient to check on him. He did not answer, was unable to LM for him.    [facility], spoke with NP Mallory cummings nurse, Mardella Layman.  Explained to her that hospital CM was unable to arrange [name] for patient.  Requesting pcp sign order and fax order and authorization request to [employer] for him.  Nurse unsure if pcp will do this as has not seen him since September 2020.  Will check and call back with response.  Note- would need rx for [facility] and auth request - on website, [email or web address]-fax both to [phone] for [employer] to assist with Home Health needs. (ph is [phone]).    Mardella Layman called back from NP [facility] office, said pcp not able to sign orders for Home Health until patient is seen in the office.   Message sent again to hospital CM, [facility]ir requesting hospitalist send orders.  Pending response from CM.

## 2019-10-11 NOTE — Progress Notes (Signed)
Called patient and LM, he called back.  York Spaniel he is ok.   No one has come by- hasn't heard from daughter or granddaughter.  Did say he had some food now, that some man had brought him some things. Difficult to understand patient at times.  Asked if the man would be coming again with food, unable to understand what his answer was to my question.  Did say his foot feels fine- although hasn't had the dressing changed.   Per hospital chart, daughter had agreed to help with the wound care.  Advised patient that I will try to contact daughter and granddaughter again for him.  Called daughter, unable to LM.  Called and LM for granddaughter.  Will continue to try and reach.

## 2019-10-19 NOTE — Progress Notes (Signed)
Called patient 9/3- unable to reach or LM.  Will continue to try and reach.

## 2019-10-25 NOTE — Progress Notes (Signed)
Called patient, unable to LM.  Called granddaughter, Michael Lozano, spoke with her. She was on her way to work.  Says he is doing ok.   Wound on foot looks better but not healed yet.  Asked her if she was doing the dressing changes- she said she is not there very often due to work, but that her fiance is doing it for him.  Has not seen pcp.  Advised granddaughter to schedule f/u with his pcp, NP Wyvonna Plum.  She agreed to do so.  Requested she call if any concerns.  CTN to check back in about a week.

## 2019-10-26 ENCOUNTER — Inpatient Hospital Stay
Admit: 2019-10-26 | Discharge: 2019-10-26 | Disposition: A | Discharge status: Home / Self Care | Payer: MEDICARE | Attending: Emergency Medicine

## 2019-10-26 ENCOUNTER — Emergency Department: Admit: 2019-10-26 | Payer: MEDICARE | Primary: Adult Health

## 2019-10-26 DIAGNOSIS — R531 Weakness: Secondary | ICD-10-CM

## 2019-10-26 LAB — METABOLIC PANEL, COMPREHENSIVE
A-G Ratio: 0.5 — ABNORMAL LOW (ref 1.1–2.2)
ALT (SGPT): 16 U/L (ref 12–78)
AST (SGOT): 24 U/L (ref 15–37)
Albumin: 3 g/dL — ABNORMAL LOW (ref 3.5–5.0)
Alk. phosphatase: 96 U/L (ref 45–117)
Anion gap: 8 mmol/L (ref 5–15)
BUN/Creatinine ratio: 10 — ABNORMAL LOW (ref 12–20)
BUN: 15 MG/DL (ref 6–20)
Bilirubin, total: 0.5 MG/DL (ref 0.2–1.0)
CO2: 23 mmol/L (ref 21–32)
Calcium: 9.5 MG/DL (ref 8.5–10.1)
Chloride: 108 mmol/L (ref 97–108)
Creatinine: 1.53 MG/DL — ABNORMAL HIGH (ref 0.70–1.30)
GFR est AA: 53 mL/min/{1.73_m2} — ABNORMAL LOW (ref >60)
GFR est non-AA: 44 mL/min/{1.73_m2} — ABNORMAL LOW (ref >60)
Globulin: 5.7 g/dL — ABNORMAL HIGH (ref 2.0–4.0)
Glucose: 90 mg/dL (ref 65–100)
Potassium: 3.9 mmol/L (ref 3.5–5.1)
Protein, total: 8.7 g/dL — ABNORMAL HIGH (ref 6.4–8.2)
Sodium: 139 mmol/L (ref 136–145)

## 2019-10-26 LAB — CBC WITH AUTOMATED DIFF
ABS. BASOPHILS: 0 10*3/uL (ref 0.0–0.1)
ABS. EOSINOPHILS: 0.2 10*3/uL (ref 0.0–0.4)
ABS. IMM. GRANS.: 0 10*3/uL (ref 0.00–0.04)
ABS. LYMPHOCYTES: 1.4 10*3/uL (ref 0.8–3.5)
ABS. MONOCYTES: 0.6 10*3/uL (ref 0.0–1.0)
ABS. NEUTROPHILS: 3.5 10*3/uL (ref 1.8–8.0)
ABSOLUTE NRBC: 0 10*3/uL (ref 0.00–0.01)
BASOPHILS: 1 % (ref 0–1)
EOSINOPHILS: 4 % (ref 0–7)
HCT: 33.5 % — ABNORMAL LOW (ref 36.6–50.3)
HGB: 10.9 g/dL — ABNORMAL LOW (ref 12.1–17.0)
IMMATURE GRANULOCYTES: 0 % (ref 0.0–0.5)
LYMPHOCYTES: 24 % (ref 12–49)
MCH: 25.8 PG — ABNORMAL LOW (ref 26.0–34.0)
MCHC: 32.5 g/dL (ref 30.0–36.5)
MCV: 79.4 FL — ABNORMAL LOW (ref 80.0–99.0)
MONOCYTES: 10 % (ref 5–13)
MPV: 10.3 FL (ref 8.9–12.9)
NEUTROPHILS: 61 % (ref 32–75)
NRBC: 0 PER 100 WBC
PLATELET: 443 10*3/uL — ABNORMAL HIGH (ref 150–400)
RBC: 4.22 M/uL (ref 4.10–5.70)
RDW: 16.2 % — ABNORMAL HIGH (ref 11.5–14.5)
WBC: 5.7 10*3/uL (ref 4.1–11.1)

## 2019-10-26 LAB — TROPONIN I: Troponin-I, Qt.: <0.05 ng/mL (ref <0.05)

## 2019-10-26 LAB — COMPREHENSIVE METABOLIC PANEL
ALT: 16 U/L (ref 12–78)
AST: 24 U/L (ref 15–37)
Albumin/Globulin Ratio: 0.5 — ABNORMAL LOW (ref 1.1–2.2)
Albumin: 3 g/dL — ABNORMAL LOW (ref 3.5–5.0)
Alkaline Phosphatase: 96 U/L (ref 45–117)
Anion Gap: 8 mmol/L (ref 5–15)
BUN: 15 MG/DL (ref 6–20)
Bun/Cre Ratio: 10 — ABNORMAL LOW (ref 12–20)
CO2: 23 mmol/L (ref 21–32)
Calcium: 9.5 MG/DL (ref 8.5–10.1)
Chloride: 108 mmol/L (ref 97–108)
Creatinine: 1.53 MG/DL — ABNORMAL HIGH (ref 0.70–1.30)
EGFR IF NonAfrican American: 44 mL/min/{1.73_m2} — ABNORMAL LOW (ref >60)
GFR African American: 53 mL/min/{1.73_m2} — ABNORMAL LOW (ref >60)
Globulin: 5.7 g/dL — ABNORMAL HIGH (ref 2.0–4.0)
Glucose: 90 mg/dL (ref 65–100)
Potassium: 3.9 mmol/L (ref 3.5–5.1)
Sodium: 139 mmol/L (ref 136–145)
Total Bilirubin: 0.5 MG/DL (ref 0.2–1.0)
Total Protein: 8.7 g/dL — ABNORMAL HIGH (ref 6.4–8.2)

## 2019-10-26 LAB — CBC WITH AUTO DIFFERENTIAL
Basophils %: 1 % (ref 0–1)
Basophils Absolute: 0 10*3/uL (ref 0.0–0.1)
Eosinophils %: 4 % (ref 0–7)
Eosinophils Absolute: 0.2 10*3/uL (ref 0.0–0.4)
Granulocyte Absolute Count: 0 10*3/uL (ref 0.00–0.04)
Hematocrit: 33.5 % — ABNORMAL LOW (ref 36.6–50.3)
Hemoglobin: 10.9 g/dL — ABNORMAL LOW (ref 12.1–17.0)
Immature Granulocytes: 0 % (ref 0.0–0.5)
Lymphocytes %: 24 % (ref 12–49)
Lymphocytes Absolute: 1.4 10*3/uL (ref 0.8–3.5)
MCH: 25.8 PG — ABNORMAL LOW (ref 26.0–34.0)
MCHC: 32.5 g/dL (ref 30.0–36.5)
MCV: 79.4 FL — ABNORMAL LOW (ref 80.0–99.0)
MPV: 10.3 FL (ref 8.9–12.9)
Monocytes %: 10 % (ref 5–13)
Monocytes Absolute: 0.6 10*3/uL (ref 0.0–1.0)
NRBC Absolute: 0 10*3/uL (ref 0.00–0.01)
Neutrophils %: 61 % (ref 32–75)
Neutrophils Absolute: 3.5 10*3/uL (ref 1.8–8.0)
Nucleated RBCs: 0 PER 100 WBC
Platelets: 443 10*3/uL — ABNORMAL HIGH (ref 150–400)
RBC: 4.22 M/uL (ref 4.10–5.70)
RDW: 16.2 % — ABNORMAL HIGH (ref 11.5–14.5)
WBC: 5.7 10*3/uL (ref 4.1–11.1)

## 2019-10-26 LAB — TROPONIN: Troponin I: <0.05 ng/mL (ref <0.05)

## 2019-10-26 MED ORDER — ACETAMINOPHEN 325 MG TABLET
325 mg | Freq: Once | ORAL | Status: AC
Start: 2019-10-26 — End: 2019-10-26
  Administered 2019-10-26: 17:00:00 via ORAL

## 2019-10-26 MED FILL — MAPAP (ACETAMINOPHEN) 325 MG TABLET: 325 mg | ORAL | Qty: 2

## 2019-10-26 NOTE — ED Notes (Signed)
1335: Xray at bedside.     1350: Patient given urinal. Encouraged to provide urine sample. Patient reports he is unable to at this time. Patient given water and juice.     1430: Patient still unable to give urine sample. Reports "I will not be able to pee for a while". Urinal remains at bedside in reach.     1618: Patient's granddaughter called and message left on voicemail to assist with discharge transportation.     1630: Response received from patient's granddaughter. She will arrange uber/lift ride home and will notify ED when ride is approaching.    1500: Min-Venditti, MD has reviewed discharge instructions with the patient. The patient verbalized understanding. Patient wheeled out of ED at this time to [place] home.
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
## 2019-10-26 NOTE — ED Notes (Signed)
1215: assumed care of patient at this time, patient on monitor x 2, SR x 2, call light in reach, patient reports generalized weakness and no appetite x 6/7 days/ Patient also states he lives on the second floor and it is not accessible with his cane making it harder for him to get around    1220: Doctor at bedside    1245: Patient eating at this time    1334: X Ray at bedside    1614: Patient refusing to give a urine sample, Patient refusing catheter

## 2019-10-26 NOTE — ED Notes (Signed)
Says he just not feeling quite right; rescue reports pt lives with others but they're not attentive to pt although leave things for him; rescue reports pt says he'd like to be in rehab facility but hasn't been able to make that happen yet.

## 2019-10-26 NOTE — Progress Notes (Signed)
5:11 PM Update  Pt has been declined by the following agencies due to location/payor source: [facility], [place], [place], [place], [place], [place], [place], [place], [place], [place], [place], [place], [place], [place], [place], [place].    Awaiting responses from the following agencies: [facility] (Closed for the weekend), [facility] (Unable to reach),  [facility], (Awaiting return call from on call representative.)    CM has updated physician on the above information and recommended for pt to be sent home with wound care supplies and have nursing complete wound care education prior to d/c from ED in the event that home health cannot be secured. Pt has also been recommended to follow up with PCP in 3 days.      3:30 PM  Still awaiting responses from referrals to [facility], [place], [place].    Declined by: [facility], [place], [facility].        2:24 PM  CM notified by ED MD that pt will need home health wound care for right foot wound. Orders have been placed. Pt not requiring admission to hospital, pt will be discharged and returning home on today. CM met with pt to discuss. Pt is in agreement with home health services, no preferences on agency. CM to send referrals via Allscripts to inquire which agency can accept pt's insurance, will update pt when agency is secured.    Cherlynn Kaiser, [place], [place]

## 2019-10-27 LAB — EKG, 12 LEAD, INITIAL
Atrial Rate: 69 {beats}/min
Calculated P Axis: 53 degrees
Calculated R Axis: 84 degrees
Calculated T Axis: -32 degrees
P-R Interval: 152 ms
Q-T Interval: 456 ms
QRS Duration: 118 ms
QTC Calculation (Bezet): 488 ms
Ventricular Rate: 69 {beats}/min

## 2019-10-27 LAB — EKG 12-LEAD
Atrial Rate: 69 {beats}/min
P Axis: 53 degrees
P-R Interval: 152 ms
Q-T Interval: 456 ms
QRS Duration: 118 ms
QTc Calculation (Bazett): 488 ms
R Axis: 84 degrees
T Axis: -32 degrees
Ventricular Rate: 69 {beats}/min

## 2019-10-27 NOTE — Telephone Encounter (Signed)
CM inquired with 21 different home health agencies on yesterday to attempt to secure [facility] SN for pt discharging from [facility] ED on 9/10. CM followed up on referrals placed via Allscripts. Please see previous notes for additional details of attempts. No agencies were able to accept pt at this time due to a combination of pt's discharge location ([place], [place]) and pt's insurance ([employer]) due to agencies either not servicing this location or not being in network with this insurance. CM called pt at [phone] to inform him of efforts. Pt expressed frustration with situation and not understanding why his insurance was not accepted. CM reviewed that agencies that went to his d/c location were not in network with his type of managed care insurance and empathized with the frustrations of the situation. CM encouraged pt to review concerns with his insurance company. CM reviewed physician's recommendations for him to follow up with primary care in 3 days as listed on his AVS. CM encouraged pt to call his PCP's office early Monday morning when office opens and review ED visit on Friday 9/10 and obtain follow up appointment as soon as possible. Pt voiced understanding and stated he will call.    Alfonso Ramus, B.S.  Care Manager, [facility]

## 2019-11-06 NOTE — Progress Notes (Signed)
Had called and LM 9/21.  Called 9/22- reached the patient.  Goals     . Supportive care in-home including food, medication, and transportation      10/08/19  Patient experiencing food insecurity, minimal family support, lack of transportation, and need for support in home.  Adult Protective Services report filed with [facility] due to lack of support, concern for self-neglect.   SW Plan:  Follow-up with insurance re: resources, family, patient   AML      . Supportive resources in place to maintain patient in the community (ie. Home Health, DME equipment, refer to, medication assistant plan, etc.)      10/08/19 [facility] 8/16-8/22 AKI/CKD3/orthostatic hypotension d/t poor intake/chronic right foot ulcer.  . Patient upset when CTN called- "I have no food in house. My family lives 60 miles away and does not care about me."  . Hospital CM unable to arrange for Home Health (? Due to address-lives in [place])  . CTN to request SW assist patient with Meals on Wheels and [employer].  . Patient gave permission for CTN to contact daughter, called her and LM.  [name] Has not gotten new meds, came home in cab, no transportation.  . Out of service area for [facility].  . Reports he can walk a bit. Using cane or walker.  [employer] CTN he can't read or write very well.   . Advised him will reach out to daughter, SW and other community resources on his behalf. Will check back in the next day or so.mbt  11/07/19  Finally reached patient by phone.  Reports he is there by himself, doesn't feel like cooking. Doesn't want to eat PB & J.  Wishes someone could bring him several meals a week to eat.  Asked if his granddaughter's fiance was still helping him with his foot wound.  He said he hadn't seen him in over a week.  And granddaughter is never there.   Did say his foot is better.  Able to walk a little better.  Advised that this is end of TOC period of care.  Suggested referring him to our CM team/SW.  He was agreeable to this  plan.  CTN to send referral to CM team/SW to assist.mbt

## 2019-11-12 NOTE — Progress Notes (Signed)
 Social Work Note  11/12/2019    Goals Addressed                 This Visit's Progress       Chronic Disease    . Supportive care in-home including food, medication, and transportation   On track     11/12/19  . Attempted to reach patient for follow-up call.  Unable to leave message. Placed call to mobile number listed on chart and reached male named Michael Lozano.   . VM left for patient's granddaughter  . Reached patient's daughter, Michael Lozano.  Identity confirmed with two identifiers. Per daughter, patient is doing well.  She stated that he is lonely and needs company.  She stated that he is a social person.  Per daughter, patient has food/water at home and is able to manage most hygiene requirements. Per daughter, patient's grandchildren have been visiting.  Daughter stated that she received a resource packet recently re: Medicaid and community resources and has talked to Michael Lozano at the [facility].  She stated that Michael Lozano has completed a screening for patient.  Discussed options of assistance available through Medicaid such as personal care aide services, etc.. Reviewed Medicaid application process and provided website - [email or web address].[email or web address]/.  Daughter stated that she has contact information for [employer].  She will assist patient with Medicaid application for long term care (aide) services.  Daughter also stated that follow-up appointment with PCP is in process of being made.  SW provided name/contact information for additional questions.   SW Plan:  Will follow-up with patient/daughter re: questions.   AML    10/08/19  Patient experiencing food insecurity, minimal family support, lack of transportation, and need for support in home.  Adult Protective Services report filed with [facility] due to lack of support, concern for self-neglect.   SW Plan:  Follow-up with insurance re: resources, family, patient   AML        Rosina Schmitz, MSW, ACSW, [facility]  Social Worker   Ambulatory Care Management   [phone]

## 2019-11-13 NOTE — Progress Notes (Signed)
 Social Work Note      11/13/19  Received call from [phone] - listed as mobile number in patient's chart.  This number is for patient's brother - Michael Lozano.  Advised that SW was attempting to reach patient.  Brother stated that his brother is difficult to reach via phone at times.     11/14/19  Attempted to reach patient.  No answer - unable to leave message as VM was full.      Rosina Schmitz, MSW, ACSW, [facility]  Social Worker  Ambulatory Care Management   [phone]

## 2019-12-12 IMAGING — DX DG CHEST 1V
1 series · 2 of 2 positions shown · non-contrast
Comparison: 02/27/2017 chest radiograph.

CLINICAL DATA: Chest pain

EXAM:
CHEST  1 VIEW

[Series 1: chest ap · 0.14mm/px · 2 of 2 slices shown]
[im 1/2]
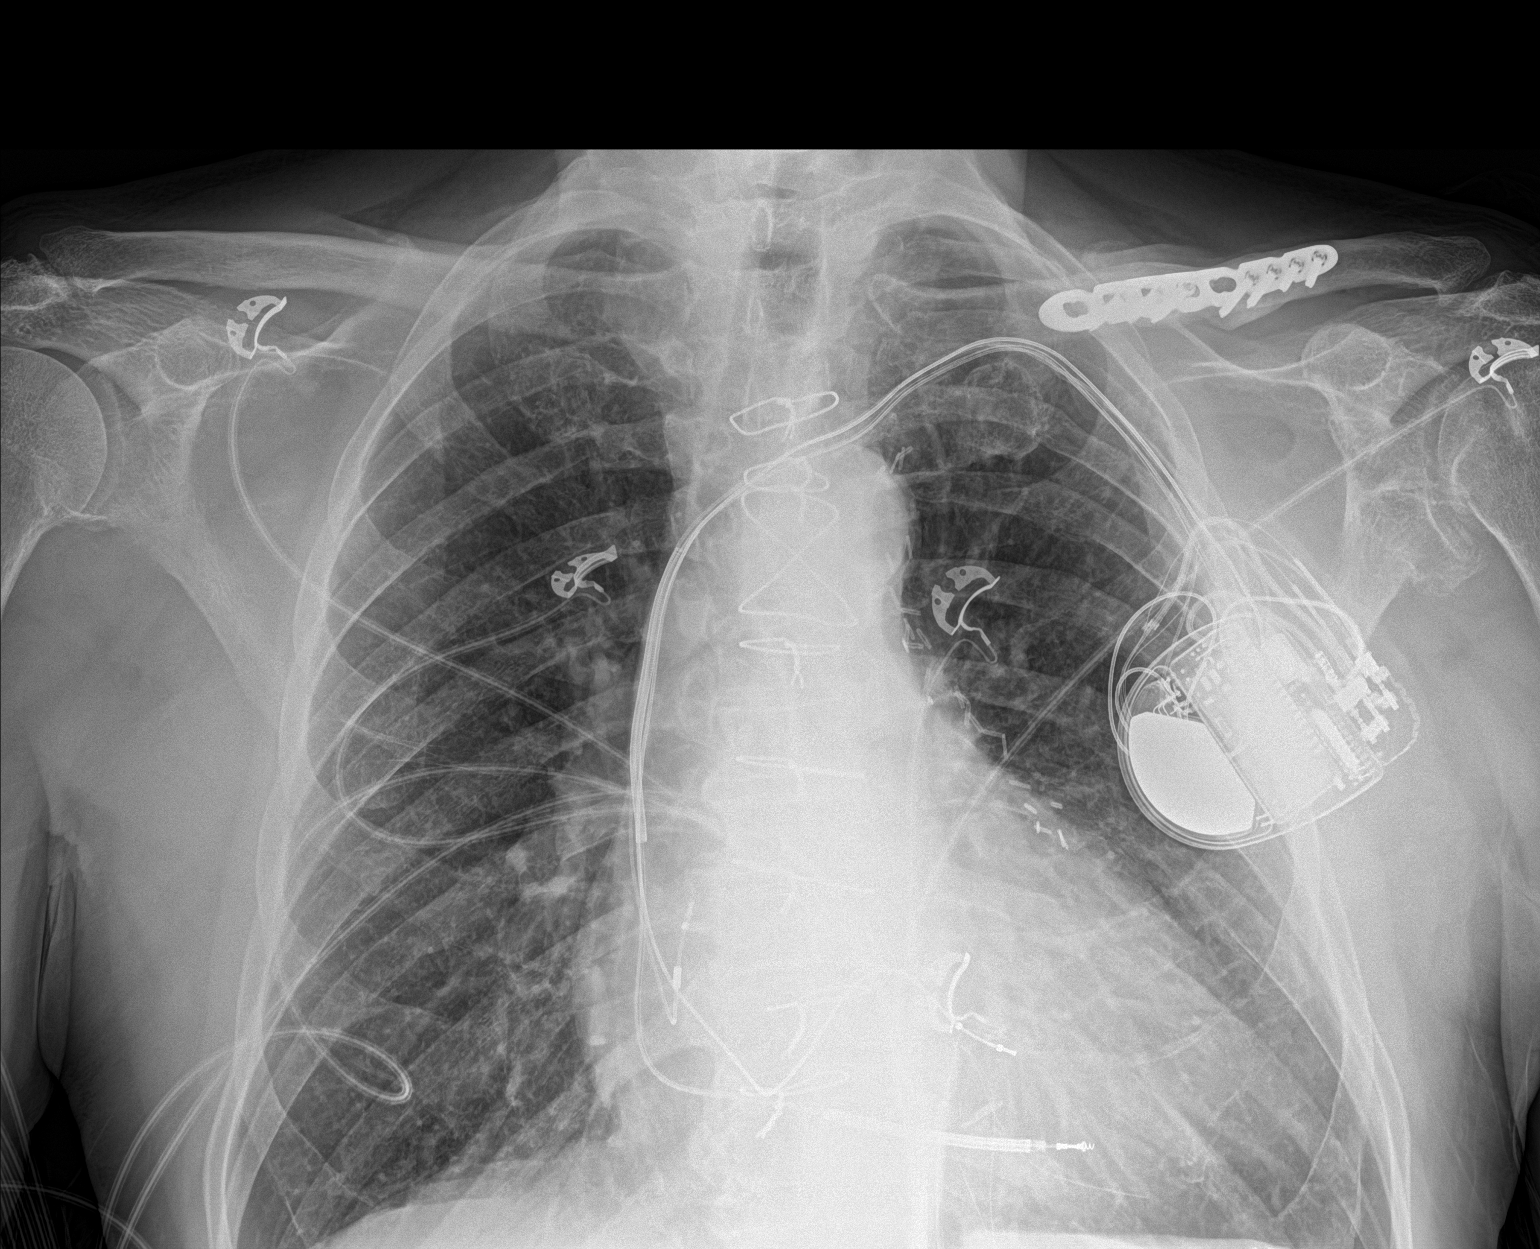
[im 2/2]
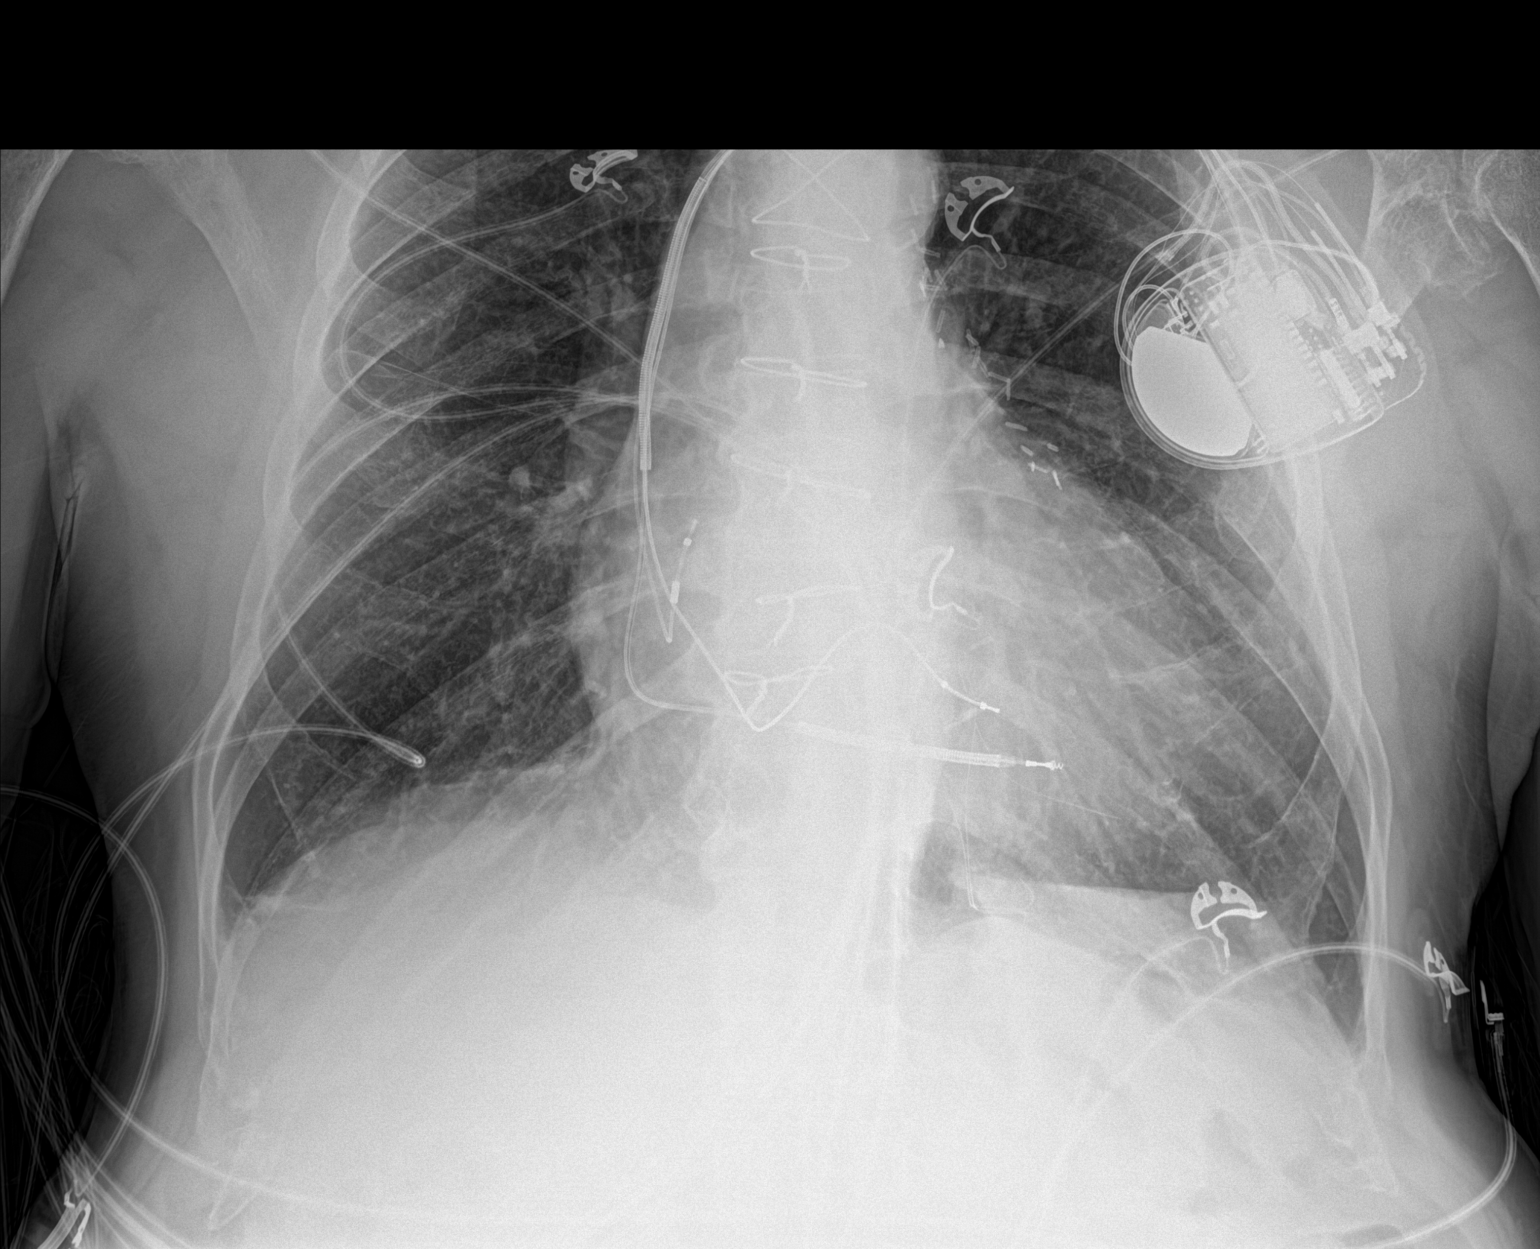

[2 of 2 positions shown; findings below may reference images not displayed]

FINDINGS: Stable configuration of left clavicular surgical hardware, median
sternotomy wires, CABG clips and 3 lead left subclavian ICD. Stable
cardiomediastinal silhouette with normal heart size. No
pneumothorax. No pleural effusion. Lungs appear clear, with no acute
consolidative airspace disease and no pulmonary edema.
IMPRESSION: No active disease.

## 2020-01-17 NOTE — Progress Notes (Signed)
 Social Work Note    Patient has graduated from the Complex Case Management  program on 01/17/20.  At this time all Social Work goals have been completed and the patient/family has the ability to self-manage.  No further Social Work follow-up scheduled.  Patient/family has Social Work contact information for further questions, concerns, or needs.     Goals Addressed                 This Visit's Progress       Chronic Disease    . COMPLETED: Supportive care in-home including food, medication, and transportation        01/17/20  Follow-up with patient.  Two identifiers used.  Patient stated that he is doing OK.  He reported that he is able to make his own meals and has contact with family. Patient stated I don't go to doctors.  SW encouraged follow-up to monitor health.   No immediate needs voiced at this time.  AML    11/12/19  . Attempted to reach patient for follow-up call.  Unable to leave message. Placed call to mobile number listed on chart and reached male named Mrs. Winebarger.   . VM left for patient's granddaughter  . Reached patient's daughter, Rock Blotter.  Identity confirmed with two identifiers. Per daughter, patient is doing well.  She stated that he is lonely and needs company.  She stated that he is a social person.  Per daughter, patient has food/water at home and is able to manage most hygiene requirements. Per daughter, patient's grandchildren have been visiting.  Daughter stated that she received a resource packet recently re: Medicaid and community resources and has talked to Ms. Ann at the [facility].  She stated that Ms. Jenkins has completed a screening for patient.  Discussed options of assistance available through Medicaid such as personal care aide services, etc.. Reviewed Medicaid application process and provided website - [email or web address].weaver.[email or web address]/.  Daughter stated that she has contact information for [employer] [facility] [employer].  She will assist patient with Medicaid  application for long term care (aide) services.  Daughter also stated that follow-up appointment with PCP is in process of being made.  SW provided name/contact information for additional questions.   SW Plan:  Will follow-up with patient/daughter re: questions.   AML    10/08/19  Patient experiencing food insecurity, minimal family support, lack of transportation, and need for support in home.  Adult Protective Services report filed with [facility] due to lack of support, concern for self-neglect.   SW Plan:  Follow-up with insurance re: resources, family, patient   AML          Patient's upcoming visits:  No future appointments.     Rosina Schmitz, MSW, ACSW, [facility]  Social Worker  Ambulatory Care Management   [phone]

## 2020-01-28 ENCOUNTER — Inpatient Hospital Stay
Admit: 2020-01-28 | Discharge: 2020-02-02 | Disposition: A | Discharge status: Home / Self Care | Payer: MEDICARE | Attending: Internal Medicine | Admitting: Internal Medicine

## 2020-01-28 ENCOUNTER — Emergency Department: Admit: 2020-01-28 | Payer: MEDICARE | Primary: Adult Health

## 2020-01-28 DIAGNOSIS — E11621 Type 2 diabetes mellitus with foot ulcer: Secondary | ICD-10-CM

## 2020-01-28 LAB — CBC WITH AUTOMATED DIFF
ABS. BASOPHILS: 0 10*3/uL (ref 0.0–0.1)
ABS. EOSINOPHILS: 0.2 10*3/uL (ref 0.0–0.4)
ABS. IMM. GRANS.: 0 10*3/uL (ref 0.00–0.04)
ABS. LYMPHOCYTES: 1.3 10*3/uL (ref 0.8–3.5)
ABS. MONOCYTES: 0.7 10*3/uL (ref 0.0–1.0)
ABS. NEUTROPHILS: 2.8 10*3/uL (ref 1.8–8.0)
ABSOLUTE NRBC: 0 10*3/uL (ref 0.00–0.01)
BASOPHILS: 1 % (ref 0–1)
EOSINOPHILS: 5 % (ref 0–7)
HCT: 36.7 % (ref 36.6–50.3)
HGB: 11.9 g/dL — ABNORMAL LOW (ref 12.1–17.0)
IMMATURE GRANULOCYTES: 0 % (ref 0.0–0.5)
LYMPHOCYTES: 25 % (ref 12–49)
MCH: 25.5 PG — ABNORMAL LOW (ref 26.0–34.0)
MCHC: 32.4 g/dL (ref 30.0–36.5)
MCV: 78.6 FL — ABNORMAL LOW (ref 80.0–99.0)
MONOCYTES: 14 % — ABNORMAL HIGH (ref 5–13)
MPV: 11.1 FL (ref 8.9–12.9)
NEUTROPHILS: 55 % (ref 32–75)
NRBC: 0 PER 100 WBC
PLATELET: 232 10*3/uL (ref 150–400)
RBC: 4.67 M/uL (ref 4.10–5.70)
RDW: 18.4 % — ABNORMAL HIGH (ref 11.5–14.5)
WBC: 5.1 10*3/uL (ref 4.1–11.1)

## 2020-01-28 LAB — METABOLIC PANEL, COMPREHENSIVE
A-G Ratio: 0.6 — ABNORMAL LOW (ref 1.1–2.2)
ALT (SGPT): 12 U/L (ref 12–78)
AST (SGOT): 19 U/L (ref 15–37)
Albumin: 3.3 g/dL — ABNORMAL LOW (ref 3.5–5.0)
Alk. phosphatase: 93 U/L (ref 45–117)
Anion gap: 4 mmol/L — ABNORMAL LOW (ref 5–15)
BUN/Creatinine ratio: 8 — ABNORMAL LOW (ref 12–20)
BUN: 12 MG/DL (ref 6–20)
Bilirubin, total: 0.4 MG/DL (ref 0.2–1.0)
CO2: 25 mmol/L (ref 21–32)
Calcium: 9.8 MG/DL (ref 8.5–10.1)
Chloride: 110 mmol/L — ABNORMAL HIGH (ref 97–108)
Creatinine: 1.45 MG/DL — ABNORMAL HIGH (ref 0.70–1.30)
GFR est AA: 56 mL/min/{1.73_m2} — ABNORMAL LOW (ref >60)
GFR est non-AA: 46 mL/min/{1.73_m2} — ABNORMAL LOW (ref >60)
Globulin: 5.1 g/dL — ABNORMAL HIGH (ref 2.0–4.0)
Glucose: 104 mg/dL — ABNORMAL HIGH (ref 65–100)
Potassium: 4 mmol/L (ref 3.5–5.1)
Protein, total: 8.4 g/dL — ABNORMAL HIGH (ref 6.4–8.2)
Sodium: 139 mmol/L (ref 136–145)

## 2020-01-28 LAB — SAMPLES BEING HELD

## 2020-01-28 LAB — GLUCOSE, POC
Glucose (POC): 79 mg/dL (ref 65–117)
Glucose (POC): 118 mg/dL — ABNORMAL HIGH (ref 65–117)

## 2020-01-28 LAB — SED RATE (ESR): Sed rate, automated: 69 mm/hr — ABNORMAL HIGH (ref 0–20)

## 2020-01-28 LAB — C REACTIVE PROTEIN, QT: C-Reactive protein: <0.29 mg/dL (ref 0.00–0.60)

## 2020-01-28 LAB — CBC WITH AUTO DIFFERENTIAL
Basophils %: 1 % (ref 0–1)
Basophils Absolute: 0 10*3/uL (ref 0.0–0.1)
Eosinophils %: 5 % (ref 0–7)
Eosinophils Absolute: 0.2 10*3/uL (ref 0.0–0.4)
Granulocyte Absolute Count: 0 10*3/uL (ref 0.00–0.04)
Hematocrit: 36.7 % (ref 36.6–50.3)
Hemoglobin: 11.9 g/dL — ABNORMAL LOW (ref 12.1–17.0)
Immature Granulocytes: 0 % (ref 0.0–0.5)
Lymphocytes %: 25 % (ref 12–49)
Lymphocytes Absolute: 1.3 10*3/uL (ref 0.8–3.5)
MCH: 25.5 PG — ABNORMAL LOW (ref 26.0–34.0)
MCHC: 32.4 g/dL (ref 30.0–36.5)
MCV: 78.6 FL — ABNORMAL LOW (ref 80.0–99.0)
MPV: 11.1 FL (ref 8.9–12.9)
Monocytes %: 14 % — ABNORMAL HIGH (ref 5–13)
Monocytes Absolute: 0.7 10*3/uL (ref 0.0–1.0)
NRBC Absolute: 0 10*3/uL (ref 0.00–0.01)
Neutrophils %: 55 % (ref 32–75)
Neutrophils Absolute: 2.8 10*3/uL (ref 1.8–8.0)
Nucleated RBCs: 0 PER 100 WBC
Platelets: 232 10*3/uL (ref 150–400)
RBC: 4.67 M/uL (ref 4.10–5.70)
RDW: 18.4 % — ABNORMAL HIGH (ref 11.5–14.5)
WBC: 5.1 10*3/uL (ref 4.1–11.1)

## 2020-01-28 LAB — COMPREHENSIVE METABOLIC PANEL
ALT: 12 U/L (ref 12–78)
AST: 19 U/L (ref 15–37)
Albumin/Globulin Ratio: 0.6 — ABNORMAL LOW (ref 1.1–2.2)
Albumin: 3.3 g/dL — ABNORMAL LOW (ref 3.5–5.0)
Alkaline Phosphatase: 93 U/L (ref 45–117)
Anion Gap: 4 mmol/L — ABNORMAL LOW (ref 5–15)
BUN: 12 MG/DL (ref 6–20)
Bun/Cre Ratio: 8 — ABNORMAL LOW (ref 12–20)
CO2: 25 mmol/L (ref 21–32)
Calcium: 9.8 MG/DL (ref 8.5–10.1)
Chloride: 110 mmol/L — ABNORMAL HIGH (ref 97–108)
Creatinine: 1.45 MG/DL — ABNORMAL HIGH (ref 0.70–1.30)
EGFR IF NonAfrican American: 46 mL/min/{1.73_m2} — ABNORMAL LOW (ref >60)
GFR African American: 56 mL/min/{1.73_m2} — ABNORMAL LOW (ref >60)
Globulin: 5.1 g/dL — ABNORMAL HIGH (ref 2.0–4.0)
Glucose: 104 mg/dL — ABNORMAL HIGH (ref 65–100)
Potassium: 4 mmol/L (ref 3.5–5.1)
Sodium: 139 mmol/L (ref 136–145)
Total Bilirubin: 0.4 MG/DL (ref 0.2–1.0)
Total Protein: 8.4 g/dL — ABNORMAL HIGH (ref 6.4–8.2)

## 2020-01-28 LAB — C-REACTIVE PROTEIN: CRP: <0.29 mg/dL (ref 0.00–0.60)

## 2020-01-28 LAB — POCT GLUCOSE
POC Glucose: 79 mg/dL (ref 65–117)
POC Glucose: 118 mg/dL — ABNORMAL HIGH (ref 65–117)

## 2020-01-28 LAB — SEDIMENTATION RATE: Sed Rate: 69 mm/hr — ABNORMAL HIGH (ref 0–20)

## 2020-01-28 MED ORDER — SODIUM CHLORIDE 0.9 % IJ SYRG
Freq: Three times a day (TID) | INTRAMUSCULAR | Status: DC
Start: 2020-01-28 — End: 2020-02-02
  Administered 2020-01-29 – 2020-02-02 (×12): via INTRAVENOUS

## 2020-01-28 MED ORDER — DEXTROSE 50% IN WATER (D50W) IV SYRG
INTRAVENOUS | Status: DC | PRN
Start: 2020-01-28 — End: 2020-02-02

## 2020-01-28 MED ORDER — INSULIN LISPRO 100 UNIT/ML INJECTION
100 unit/mL | Freq: Four times a day (QID) | SUBCUTANEOUS | Status: DC
Start: 2020-01-28 — End: 2020-02-02

## 2020-01-28 MED ORDER — CEFTRIAXONE 1 GRAM SOLUTION FOR INJECTION
1 gram | INTRAMUSCULAR | Status: AC
Start: 2020-01-28 — End: 2020-01-28
  Administered 2020-01-28: 21:00:00 via INTRAVENOUS

## 2020-01-28 MED ORDER — GLUCAGON 1 MG INJECTION
1 mg | INTRAMUSCULAR | Status: DC | PRN
Start: 2020-01-28 — End: 2020-02-02

## 2020-01-28 MED ORDER — SODIUM CHLORIDE 0.9 % IJ SYRG
INTRAMUSCULAR | Status: DC | PRN
Start: 2020-01-28 — End: 2020-02-02

## 2020-01-28 MED ORDER — ACETAMINOPHEN 325 MG TABLET
325 mg | Freq: Four times a day (QID) | ORAL | Status: DC | PRN
Start: 2020-01-28 — End: 2020-02-02

## 2020-01-28 MED ORDER — POLYETHYLENE GLYCOL 3350 17 GRAM (100 %) ORAL POWDER PACKET
17 gram | Freq: Every day | ORAL | Status: DC | PRN
Start: 2020-01-28 — End: 2020-02-02

## 2020-01-28 MED ORDER — ACETAMINOPHEN 650 MG RECTAL SUPPOSITORY
650 mg | Freq: Four times a day (QID) | RECTAL | Status: DC | PRN
Start: 2020-01-28 — End: 2020-02-02

## 2020-01-28 MED ORDER — ONDANSETRON (PF) 4 MG/2 ML INJECTION
4 mg/2 mL | Freq: Four times a day (QID) | INTRAMUSCULAR | Status: DC | PRN
Start: 2020-01-28 — End: 2020-02-02

## 2020-01-28 MED ORDER — GLUCOSE 4 GRAM CHEWABLE TAB
4 gram | ORAL | Status: DC | PRN
Start: 2020-01-28 — End: 2020-02-02

## 2020-01-28 MED ORDER — ONDANSETRON 4 MG TAB, RAPID DISSOLVE
4 mg | Freq: Three times a day (TID) | ORAL | Status: DC | PRN
Start: 2020-01-28 — End: 2020-02-02

## 2020-01-28 MED ORDER — ENOXAPARIN 40 MG/0.4 ML SUB-Q SYRINGE
40 mg/0.4 mL | Freq: Every day | SUBCUTANEOUS | Status: DC
Start: 2020-01-28 — End: 2020-02-02
  Administered 2020-01-29 – 2020-02-02 (×4): via SUBCUTANEOUS

## 2020-01-28 MED ORDER — HYDRALAZINE 20 MG/ML IJ SOLN
20 mg/mL | Freq: Four times a day (QID) | INTRAMUSCULAR | Status: DC | PRN
Start: 2020-01-28 — End: 2020-02-02

## 2020-01-28 MED ORDER — GABAPENTIN 100 MG CAP
100 mg | Freq: Three times a day (TID) | ORAL | Status: DC
Start: 2020-01-28 — End: 2020-02-02
  Administered 2020-01-28 – 2020-02-02 (×14): via ORAL

## 2020-01-28 MED FILL — BD POSIFLUSH NORMAL SALINE 0.9 % INJECTION SYRINGE: INTRAMUSCULAR | Qty: 40

## 2020-01-28 MED FILL — CEFTRIAXONE 1 GRAM SOLUTION FOR INJECTION: 1 gram | INTRAMUSCULAR | Qty: 1

## 2020-01-28 MED FILL — GABAPENTIN 100 MG CAP: 100 mg | ORAL | Qty: 2
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
## 2020-01-28 NOTE — ED Notes (Signed)
Pt to er with c/o numbness and pain to the left lower extremity. Pt states that he was told before that he needed surgery. Pt states he doesn't have a doctor.

## 2020-01-28 NOTE — Op Note (Signed)
[place]  OPERATIVE REPORT    Name:  JORDI, LACKO  MR#:  [id number]  DOB:  [birth year]  ACCOUNT #:  [id number]  DATE OF SERVICE:  01/30/2020    PREOPERATIVE DIAGNOSIS:  Ulcerative lesion measuring 3.2 x 3.7 cm, plantar aspect, right foot.    POSTOPERATIVE DIAGNOSIS:  Ulcerative lesion measuring 3.2 x 3.7 cm, plantar aspect, right foot.    PROCEDURE PERFORMED:  Incision and drainage with debridement using Misonix debridement of the diabetic neuropathic ulceration, right foot.    SURGEON:  Alita Chyle, MD    ASSISTANT:  None.    ANESTHESIA:  Sedation with local, 10 mL of 0.5% Marcaine plain.    COMPLICATIONS:  None apparent.    SPECIMENS REMOVED:  None.    IMPLANTS:  None.    ESTIMATED BLOOD LOSS:  Less than 2 mL.    PROCEDURE:  The patient was brought to the operating room and placed on the operating table in supine position.  Anesthesiologist administered sedation followed by local infiltrative block around the lesion on plantar aspect of right foot.  The foot was then prepped and draped in usual aseptic technique and no tourniquet for hemostasis was utilized.    Debridement was carried out along the periphery of the lesion showing only dermal tissue exposure.  This was done sharply and good bleeding tissue edges were noticed at this time frame.  A deep tissue culture was taken for culture and sensitivity and no undermining past dermal tissue was seen, no exposure of tendon and no purulence.    At this point, the Misonix debridement was carried out centrally first and around the periphery of the wound.  Again, no undermining or exposure of deep tissues past dermal tissue level was seen and the patient showed good pinking up of the tissues once completed.    At this point, the objectives of procedure were believed to have been met and the patient was dressed with Xeroform, 4 x 4, 3-inch Kerlix, and 3-inch Ace wrap in right foot.  The patient tolerated above procedures and anesthesia  well, was discharged from the operating room with vital signs stable and vascular status intact in the right foot.  Prognosis for this patient is satisfactory.        Alita Chyle, MD      [id number]  D:  01/30/2020 13:23  T:  01/30/2020 20:52  JOB #:  [id number]

## 2020-01-28 NOTE — ED Notes (Signed)
 Patient is being transferred to [facility], Room # 1116.  Report given to Rico PEAK, RN on [employer] for routine progression of care.  Report consisted of the following information SBAR, ED Summary and MAR.  Patient transferred to receiving unit by: Transport (RN or tech name).    Outstanding consults needed: No     Next labs due: No     The following personal items will be sent with the patient during transfer to the floor:    All valuables:    Cardiac monitoring ordered: No     The following CURRENT information was reported to the receiving RN:    Code status: Full Code at time of transfer    Last set of vital signs:  Vital Signs  Level of Consciousness: Alert (0) (01/28/20 0953)  Temp: 97.7 F (36.5 C) (01/28/20 0953)  Temp Source: Oral (01/28/20 0953)  Pulse (Heart Rate): 65 (01/28/20 0953)  Heart Rate Source: Monitor (01/28/20 0953)  Cardiac Rhythm: Sinus Rhythm (01/28/20 0957)  Resp Rate: 18 (01/28/20 0953)  BP: (!) 148/74 (01/28/20 1654)  MAP (Monitor): 97 (01/28/20 1654)  MAP (Calculated): 99 (01/28/20 1654)  BP 1 Location: Right upper arm (01/28/20 0953)  BP Patient Position: At rest (01/28/20 0953)  MEWS Score: 1 (01/28/20 0953)         Oxygen Therapy  O2 Sat (%): 100 % (01/28/20 1654)  Pulse via Oximetry: 62 beats per minute (01/28/20 1654)  O2 Device: None (Room air) (01/28/20 0957)      Last pain assessment:  Pain 1  Patient Stated Pain Goal: 0      Wounds: Yes    Urinary catheter: voiding  Is there a foley order: No     LDAs:       Peripheral IV 01/28/20 Left Arm (Active)   Site Assessment Clean, dry, & intact 01/28/20 1010         Opportunity for questions and clarification was provided.    Sharyne Bors, RN

## 2020-01-28 NOTE — Progress Notes (Signed)
End of Shift Note    Bedside shift change report given to Annice Pih (Cabin crew) by Jonny Ruiz, RN (offgoing nurse).  Report included the following information SBAR, Kardex, ED Summary, Intake/Output, MAR and Recent Results    Shift worked:  1730-1900     Shift summary and any significant changes:    Pt transferred from ED, oriented to room and unit, pt eating [employer] cuisine meal   Concerns for physician to address: none   Zone phone for oncoming shift:  [phone]
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
## 2020-01-29 LAB — CBC WITH AUTOMATED DIFF
ABS. BASOPHILS: 0 10*3/uL (ref 0.0–0.1)
ABS. EOSINOPHILS: 0.3 10*3/uL (ref 0.0–0.4)
ABS. IMM. GRANS.: 0 10*3/uL (ref 0.00–0.04)
ABS. LYMPHOCYTES: 1 10*3/uL (ref 0.8–3.5)
ABS. MONOCYTES: 0.5 10*3/uL (ref 0.0–1.0)
ABS. NEUTROPHILS: 3.7 10*3/uL (ref 1.8–8.0)
ABSOLUTE NRBC: 0 10*3/uL (ref 0.00–0.01)
BASOPHILS: 0 % (ref 0–1)
EOSINOPHILS: 5 % (ref 0–7)
HCT: 33.6 % — ABNORMAL LOW (ref 36.6–50.3)
HGB: 10.8 g/dL — ABNORMAL LOW (ref 12.1–17.0)
IMMATURE GRANULOCYTES: 0 % (ref 0.0–0.5)
LYMPHOCYTES: 19 % (ref 12–49)
MCH: 25 PG — ABNORMAL LOW (ref 26.0–34.0)
MCHC: 32.1 g/dL (ref 30.0–36.5)
MCV: 77.8 FL — ABNORMAL LOW (ref 80.0–99.0)
MONOCYTES: 8 % (ref 5–13)
MPV: 10.6 FL (ref 8.9–12.9)
NEUTROPHILS: 68 % (ref 32–75)
NRBC: 0 PER 100 WBC
PLATELET: 236 10*3/uL (ref 150–400)
RBC: 4.32 M/uL (ref 4.10–5.70)
RDW: 18.1 % — ABNORMAL HIGH (ref 11.5–14.5)
WBC: 5.5 10*3/uL (ref 4.1–11.1)

## 2020-01-29 LAB — METABOLIC PANEL, BASIC
Anion gap: 7 mmol/L (ref 5–15)
BUN/Creatinine ratio: 10 — ABNORMAL LOW (ref 12–20)
BUN: 13 MG/DL (ref 6–20)
CO2: 23 mmol/L (ref 21–32)
Calcium: 9.4 MG/DL (ref 8.5–10.1)
Chloride: 110 mmol/L — ABNORMAL HIGH (ref 97–108)
Creatinine: 1.26 MG/DL (ref 0.70–1.30)
GFR est AA: >60 mL/min/{1.73_m2} (ref >60)
GFR est non-AA: 55 mL/min/{1.73_m2} — ABNORMAL LOW (ref >60)
Glucose: 80 mg/dL (ref 65–100)
Potassium: 3.7 mmol/L (ref 3.5–5.1)
Sodium: 140 mmol/L (ref 136–145)

## 2020-01-29 LAB — GLUCOSE, POC
Glucose (POC): 164 mg/dL — ABNORMAL HIGH (ref 65–117)
Glucose (POC): 177 mg/dL — ABNORMAL HIGH (ref 65–117)
Glucose (POC): 89 mg/dL (ref 65–117)
Glucose (POC): 89 mg/dL (ref 65–117)

## 2020-01-29 LAB — HEMOGLOBIN A1C WITH EAG
Est. average glucose: 117 mg/dL
Hemoglobin A1c: 5.7 % — ABNORMAL HIGH (ref 4.0–5.6)

## 2020-01-29 LAB — COVID-19 RAPID TEST: COVID-19 rapid test: NOT DETECTED

## 2020-01-29 LAB — CBC WITH AUTO DIFFERENTIAL
Basophils %: 0 % (ref 0–1)
Basophils Absolute: 0 10*3/uL (ref 0.0–0.1)
Eosinophils %: 5 % (ref 0–7)
Eosinophils Absolute: 0.3 10*3/uL (ref 0.0–0.4)
Granulocyte Absolute Count: 0 10*3/uL (ref 0.00–0.04)
Hematocrit: 33.6 % — ABNORMAL LOW (ref 36.6–50.3)
Hemoglobin: 10.8 g/dL — ABNORMAL LOW (ref 12.1–17.0)
Immature Granulocytes: 0 % (ref 0.0–0.5)
Lymphocytes %: 19 % (ref 12–49)
Lymphocytes Absolute: 1 10*3/uL (ref 0.8–3.5)
MCH: 25 PG — ABNORMAL LOW (ref 26.0–34.0)
MCHC: 32.1 g/dL (ref 30.0–36.5)
MCV: 77.8 FL — ABNORMAL LOW (ref 80.0–99.0)
MPV: 10.6 FL (ref 8.9–12.9)
Monocytes %: 8 % (ref 5–13)
Monocytes Absolute: 0.5 10*3/uL (ref 0.0–1.0)
NRBC Absolute: 0 10*3/uL (ref 0.00–0.01)
Neutrophils %: 68 % (ref 32–75)
Neutrophils Absolute: 3.7 10*3/uL (ref 1.8–8.0)
Nucleated RBCs: 0 PER 100 WBC
Platelets: 236 10*3/uL (ref 150–400)
RBC: 4.32 M/uL (ref 4.10–5.70)
RDW: 18.1 % — ABNORMAL HIGH (ref 11.5–14.5)
WBC: 5.5 10*3/uL (ref 4.1–11.1)

## 2020-01-29 LAB — BASIC METABOLIC PANEL
Anion Gap: 7 mmol/L (ref 5–15)
BUN: 13 MG/DL (ref 6–20)
Bun/Cre Ratio: 10 — ABNORMAL LOW (ref 12–20)
CO2: 23 mmol/L (ref 21–32)
Calcium: 9.4 MG/DL (ref 8.5–10.1)
Chloride: 110 mmol/L — ABNORMAL HIGH (ref 97–108)
Creatinine: 1.26 MG/DL (ref 0.70–1.30)
EGFR IF NonAfrican American: 55 mL/min/{1.73_m2} — ABNORMAL LOW (ref >60)
GFR African American: >60 mL/min/{1.73_m2} (ref >60)
Glucose: 80 mg/dL (ref 65–100)
Potassium: 3.7 mmol/L (ref 3.5–5.1)
Sodium: 140 mmol/L (ref 136–145)

## 2020-01-29 LAB — POCT GLUCOSE
POC Glucose: 164 mg/dL — ABNORMAL HIGH (ref 65–117)
POC Glucose: 177 mg/dL — ABNORMAL HIGH (ref 65–117)
POC Glucose: 89 mg/dL (ref 65–117)
POC Glucose: 89 mg/dL (ref 65–117)

## 2020-01-29 LAB — COVID-19, RAPID: SARS-CoV-2, Rapid: NOT DETECTED

## 2020-01-29 LAB — HEMOGLOBIN A1C W/EAG
Hemoglobin A1C: 5.7 % — ABNORMAL HIGH (ref 4.0–5.6)
eAG: 117 mg/dL

## 2020-01-29 MED FILL — GABAPENTIN 100 MG CAP: 100 mg | ORAL | Qty: 2

## 2020-01-29 MED FILL — ENOXAPARIN 40 MG/0.4 ML SUB-Q SYRINGE: 40 mg/0.4 mL | SUBCUTANEOUS | Qty: 0.4

## 2020-01-29 NOTE — Progress Notes (Signed)
Problem: Falls - Risk of  Goal: *Absence of Falls  Description: Document Schmid Fall Risk and appropriate interventions in the flowsheet.  Outcome: Progressing Towards Goal  Note: Fall Risk Interventions:  Mobility Interventions: Bed/chair exit alarm, Communicate number of staff needed for ambulation/transfer, Patient to call before getting OOB              Elimination Interventions: Bed/chair exit alarm, Call light in reach, Patient to call for help with toileting needs

## 2020-01-29 NOTE — Progress Notes (Signed)
Occupational Therapy  Chart reviewed;  Initially cleared for tx; then RN states patient has not yet voided today so she needs to further assess. Will retry later as able for OT eval. Note surgery planned for tomorrow. Biagio Borg OTR/L

## 2020-01-29 NOTE — Progress Notes (Signed)
Bedside and Verbal shift change report given to Burnett Harry RN (oncoming nurse) by Durwin Reges (offgoing nurse). Report included the following information SBAR, Kardex, MAR and Recent Results. Patient rested well this night  Sleeping at short intervals only, no complaints voiced.

## 2020-01-29 NOTE — Progress Notes (Signed)
End of Shift Note    Bedside shift change report given to Val (oncoming nurse) by Jonny Ruiz, RN (offgoing nurse).  Report included the following information SBAR, Kardex, ED Summary, Intake/Output, MAR and Recent Results    Shift worked:  0700-1930     Shift summary and any significant changes:    Pt watched tv most of day, consent signed for incision and drainage on right foot for tomorrow, COVID swab complete, pt NPO at midnight, pt did not void for whole shift, bladder scan showed retaining, pt states he normally doesn't urinate that much. Val, night shift nurse is aware and will monitor.   Concerns for physician to address: none   Zone phone for oncoming shift:  [phone]

## 2020-01-29 NOTE — Progress Notes (Signed)
Progress  Notes by Avel Sensor, MD at 01/29/20 1314                Author: Avel Sensor, MD  Service: Internal Medicine  Author Type: Physician       Filed: 01/29/20 1317  Date of Service: 01/29/20 1314  Status: Signed          Editor: Avel Sensor, MD (Physician)                       Hospitalist Progress Note      NAME: Michael Lozano    DOB:  1935/01/15    MRN:  [id number]          Assessment / Plan:        Chronic RLE diabetic wound   Hx of T2DM   Peripheral neuropathy   Duplex US neg, xr R foot neg for acute abnormality   No leukocytosis or fever, continue holding off on additional abx   Hemoglobin A1c 5.7%   Started on gabapentin   Continue correction scale   Consulted podiatry and wound care.  Plans for I&D tomorrow by podiatrist   PT/OT   X-ray with no evidence of cortical destruction, foreign body, bone tumor, gas.   ??   CKD3   Creatinine at baseline   ??   Hx of DVT   Hx of GERD   HLD    HTN   Pt states he is currently not on any medications   IV hydralazine prn   ??      25.0 - 29.9 Overweight  / Body mass index is 27.26 kg/m??.      Estimated discharge date: December 16   Barriers: Clinical improvement      Code status: Full   Prophylaxis: SCD's   Recommended Disposition: TBD          Subjective:        Patient was seen and examined.   No acute events overnight.      Discussed with RN overnight events.      All patient's questions were answered.      "doing ok"      Review of Systems:           Symptom  Y/N  Comments    Symptom  Y/N  Comments             Fever/Chills  n      Chest Pain  n               Poor Appetite        Edema         Cough  n      Abdominal Pain  n               Sputum        Joint Pain         SOB/DOE  n      Pruritis/Rash         Nausea/vomit  n      Tolerating PT/OT         Diarrhea        Tolerating Diet  y               Constipation        Other               Could NOT obtain due to:  Objective:        VITALS:    Last 24hrs VS reviewed since prior  progress note. Most recent are:   Patient Vitals for the past 24 hrs:            Temp  Pulse  Resp  BP  SpO2            01/29/20 0755  98.3 ??F (36.8 ??C)  69  20  108/60  99 %            01/29/20 0020  98.2 ??F (36.8 ??C)  61  18  (!) 100/53  100 %     01/28/20 1936  97.4 ??F (36.3 ??C)  62  18  (!) 120/47  100 %     01/28/20 1753  97.9 ??F (36.6 ??C)  72  18  (!) 147/68  100 %     01/28/20 1654  --  --  --  (!) 148/74  100 %     01/28/20 1639  --  --  --  (!) 154/83  98 %     01/28/20 1624  --  --  --  (!) 142/75  98 %     01/28/20 1609  --  --  --  (!) 149/75  97 %     01/28/20 1554  --  --  --  (!) 148/84  99 %     01/28/20 1539  --  --  --  139/73  98 %            01/28/20 1524  --  --  --  139/71  96 %           Intake/Output Summary (Last 24 hours) at 01/29/2020 1314   Last data filed at 01/28/2020 1701     Gross per 24 hour        Intake  50 ml        Output  --        Net  50 ml            I had a face to face encounter and independently examined this patient on 01/29/2020, as outlined below:   PHYSICAL EXAM:   General: WD, WN. Alert, cooperative, no acute distress     EENT:  EOMI. Anicteric sclerae. MMM   Resp:  CTA bilaterally, no wheezing or rales.  No accessory muscle use   CV:  Regular  rhythm,  No edema   GI:  Soft, Non distended, Non tender.  +Bowel sounds   Neurologic:  Alert and oriented X 3, normal speech,    Psych:   Good insight. Not anxious nor agitated   Skin:  No rashes.  No jaundice.  Right foot ulcer with no drainage      Reviewed most current lab test results and cultures  YES   Reviewed most current radiology test results   YES   Review and summation of old records today    NO   Reviewed patient's current orders and MAR    YES   PMH/SH reviewed - no change compared to H&P   ________________________________________________________________________   Care Plan discussed with:           Comments         Patient  x           Family              RN  x  Care Manager             Consultant                                   Multidiciplinary team rounds were held today with case manager, nursing, pharmacist and clinical coordinator.  Patient's plan of care was discussed; medications  were reviewed and discharge planning was addressed.       ________________________________________________________________________   Total NON critical care TIME:  35  Minutes      Total CRITICAL CARE TIME Spent:   Minutes non procedure based              Comments         >50% of visit spent in counseling and coordination of care         ________________________________________________________________________   Avel Sensor, MD       Procedures: see electronic medical records for all procedures/Xrays and details which were not copied into this note but were reviewed prior to creation of Plan.        LABS:   I reviewed today's most current labs and imaging studies.   Pertinent labs include:     Recent Labs            01/29/20   0349  01/28/20   1004     WBC  5.5  5.1     HGB  10.8*  11.9*     HCT  33.6*  36.7         PLT  236  232          Recent Labs            01/29/20   0349  01/28/20   1238     NA  140  139     K  3.7  4.0     CL  110*  110*     CO2  23  25     GLU  80  104*     BUN  13  12     CREA  1.26  1.45*     CA  9.4  9.8     ALB   --   3.3*     TBILI   --   0.4         ALT   --   12           Signed: Avel Sensor, MD

## 2020-01-29 NOTE — Consults (Signed)
Planning for surgical debridement tomorrow, checking the surgical schedule for tomorrow I&D.
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
## 2020-01-30 LAB — GLUCOSE, POC
Glucose (POC): 142 mg/dL — ABNORMAL HIGH (ref 65–117)
Glucose (POC): 105 mg/dL (ref 65–117)
Glucose (POC): 87 mg/dL (ref 65–117)
Glucose (POC): 86 mg/dL (ref 65–117)
Glucose (POC): 151 mg/dL — ABNORMAL HIGH (ref 65–117)

## 2020-01-30 LAB — POCT GLUCOSE
POC Glucose: 142 mg/dL — ABNORMAL HIGH (ref 65–117)
POC Glucose: 105 mg/dL (ref 65–117)
POC Glucose: 87 mg/dL (ref 65–117)
POC Glucose: 86 mg/dL (ref 65–117)
POC Glucose: 151 mg/dL — ABNORMAL HIGH (ref 65–117)

## 2020-01-30 MED ORDER — BUPIVACAINE (PF) 0.5 % (5 MG/ML) IJ SOLN
0.5 % (5 mg/mL) | INTRAMUSCULAR | Status: DC | PRN
Start: 2020-01-30 — End: 2020-01-30
  Administered 2020-01-30: 19:00:00 via SUBCUTANEOUS

## 2020-01-30 MED ORDER — BUPIVACAINE (PF) 0.5 % (5 MG/ML) IJ SOLN
0.5 % (5 mg/mL) | INTRAMUSCULAR | Status: AC
Start: 2020-01-30 — End: ?

## 2020-01-30 MED ORDER — LIDOCAINE (PF) 20 MG/ML (2 %) IJ SOLN
20 mg/mL (2 %) | INTRAMUSCULAR | Status: AC
Start: 2020-01-30 — End: ?

## 2020-01-30 MED ORDER — ROCURONIUM 10 MG/ML IV
10 mg/mL | INTRAVENOUS | Status: AC
Start: 2020-01-30 — End: ?

## 2020-01-30 MED ORDER — PROPOFOL 10 MG/ML IV EMUL
10 mg/mL | INTRAVENOUS | Status: DC | PRN
Start: 2020-01-30 — End: 2020-01-30
  Administered 2020-01-30 (×2): via INTRAVENOUS

## 2020-01-30 MED ORDER — FENTANYL CITRATE (PF) 50 MCG/ML IJ SOLN
50 mcg/mL | INTRAMUSCULAR | Status: DC | PRN
Start: 2020-01-30 — End: 2020-01-30
  Administered 2020-01-30: 18:00:00 via INTRAVENOUS

## 2020-01-30 MED ORDER — FENTANYL CITRATE (PF) 50 MCG/ML IJ SOLN
50 mcg/mL | INTRAMUSCULAR | Status: AC
Start: 2020-01-30 — End: ?

## 2020-01-30 MED ORDER — PROPOFOL 10 MG/ML IV EMUL
10 mg/mL | INTRAVENOUS | Status: DC | PRN
Start: 2020-01-30 — End: 2020-01-30
  Administered 2020-01-30: 18:00:00 via INTRAVENOUS

## 2020-01-30 MED ORDER — LACTATED RINGERS IV
INTRAVENOUS | Status: DC | PRN
Start: 2020-01-30 — End: 2020-01-30
  Administered 2020-01-30: 18:00:00 via INTRAVENOUS

## 2020-01-30 MED ORDER — LIDOCAINE (PF) 20 MG/ML (2 %) IJ SOLN
20 mg/mL (2 %) | INTRAMUSCULAR | Status: DC | PRN
Start: 2020-01-30 — End: 2020-01-30
  Administered 2020-01-30: 18:00:00 via INTRAVENOUS

## 2020-01-30 MED FILL — ENOXAPARIN 40 MG/0.4 ML SUB-Q SYRINGE: 40 mg/0.4 mL | SUBCUTANEOUS | Qty: 0.4

## 2020-01-30 MED FILL — GABAPENTIN 100 MG CAP: 100 mg | ORAL | Qty: 2

## 2020-01-30 MED FILL — ROCURONIUM 10 MG/ML IV: 10 mg/mL | INTRAVENOUS | Qty: 5

## 2020-01-30 MED FILL — LIDOCAINE (PF) 20 MG/ML (2 %) IJ SOLN: 20 mg/mL (2 %) | INTRAMUSCULAR | Qty: 5

## 2020-01-30 MED FILL — BUPIVACAINE (PF) 0.5 % (5 MG/ML) IJ SOLN: 0.5 % (5 mg/mL) | INTRAMUSCULAR | Qty: 30

## 2020-01-30 MED FILL — FENTANYL CITRATE (PF) 50 MCG/ML IJ SOLN: 50 mcg/mL | INTRAMUSCULAR | Qty: 2

## 2020-01-30 NOTE — Interval H&P Note (Signed)
 1325 - PT ARRIVED INTO PACU HOLDING BAY 3 - A&OX4 - MAE SPON AND TO COMMAND.   RESP EVEN AND UNLABORED.  POX ON RA > 94%.  BSB.  PT STATES HAS SOME SOB ON EXERTION.  RIGHT FOOT ACE WRAP CD&I - PT DENIES DISCOMFORT 0/10 ON PAIN SCALE.  RESTING IN NAPS.  TRANSFER - OUT REPORT:  1410    Verbal report given to [facility] RN(name) on [employer]  being transferred to 1116 (unit) for routine post - op       Report consisted of patient's Situation, Background, Assessment and   Recommendations(SBAR).     Information from the following report(s) Procedure Summary was reviewed with the receiving nurse.    Lines:   Peripheral IV 01/28/20 Left Arm (Active)   Site Assessment Clean, dry, & intact 01/30/20 1202   Phlebitis Assessment 0 01/30/20 1202   Infiltration Assessment 0 01/30/20 1202   Dressing Status Clean, dry, & intact 01/30/20 1202   Dressing Type Transparent;Tape 01/30/20 1202   Hub Color/Line Status Pink;Infusing;Patent;Flushed 01/30/20 1202   Alcohol Cap Used Yes 01/30/20 0300        Opportunity for questions and clarification was provided.      Patient transported with:   [employer]

## 2020-01-30 NOTE — Progress Notes (Signed)
End of Shift Note    Bedside shift change report given to Tica (oncoming nurse) by Reola Calkins (offgoing nurse).  Report included the following information SBAR, Kardex and MAR    Shift worked:  7p-7a     Shift summary and any significant changes:     Humalog was held due to the patient's blood glucose level, see MAR.  Scheduled medications were given, see MAR.  Patient did not complain of ay pain during my shift.  IV has been flushed and is patent.  Patient is up with the assistance of one.  Patient teaching and routine rounding has been done.     Concerns for physician to address:       Zone phone for oncoming shift:          Activity:  Activity Level: Up with Assistance  Number times ambulated in hallways past shift: 0  Number of times OOB to chair past shift: 0    Cardiac:   Cardiac Monitoring: No      Cardiac Rhythm: Sinus Rhythm    Access:   Current line(s): PIV     Genitourinary:   Urinary status: voiding    Respiratory:   O2 Device: None (Room air)  Chronic home O2 use?: NO  Incentive spirometer at bedside: NO     GI:  Last Bowel Movement Date: 01/27/20  Current diet:  DIET ONE TIME MESSAGE  DIET NPO  Passing flatus: YES  Tolerating current diet: YES       Pain Management:   Patient states pain is manageable on current regimen: YES    Skin:  Braden Score: 17  Interventions: increase time out of bed    Patient Safety:  Fall Score: Total Score: 2  Interventions: bed/chair alarm, gripper socks and pt to call before getting OOB       Length of Stay:  Expected LOS: 2d 21h  Actual LOS: 2      Reola Calkins

## 2020-01-30 NOTE — Anesthesia Pre-Procedure Evaluation (Signed)
Relevant Problems   CARDIOVASCULAR   (+) CAD (coronary artery disease)      RENAL FAILURE   (+) AKI (acute kidney injury) ([place])       Anesthetic History   No history of anesthetic complications            Review of Systems / Medical History  Patient summary reviewed, nursing notes reviewed and pertinent labs reviewed    Pulmonary  Within defined limits                 Neuro/Psych   Within defined limits           Cardiovascular              CAD    Exercise tolerance: >4 METS  Comments: 6/13 echo lvef 40% with multiple regional wall motion abnormalities.  Biventricular aicd,    GI/Hepatic/Renal  Within defined limits              Endo/Other    Diabetes: type 2         Other Findings              Physical Exam    Airway  Mallampati: III  TM Distance: 4 - 6 cm  Neck ROM: normal range of motion   Mouth opening: Normal     Cardiovascular  Regular rate and rhythm,  S1 and S2 normal,  no murmur, click, rub, or gallop  Rhythm: regular  Rate: normal         Dental  No notable dental hx       Pulmonary  Breath sounds clear to auscultation               Abdominal  GI exam deferred       Other Findings            Anesthetic Plan    ASA: 3  Anesthesia type: MAC and general - backup          Induction: Intravenous  Anesthetic plan and risks discussed with: Patient

## 2020-01-30 NOTE — Progress Notes (Signed)
End of Shift Note    Bedside shift change report given to  (oncoming nurse) by Wyvonne Lenz, RN (offgoing nurse).  Report included the following information SBAR, Intake/Output, MAR and Recent Results    Shift worked:  7 a - 7 p     Shift summary and any significant changes:    Patient had I/D done this afternoon. Faired well will no concerns. Diet changed to Adult regular.. Patient BS stable with no sliding scale insulin needed. Post op shoe in the room for Dr to place tomorrow.      Concerns for physician to address:  none     Zone phone for oncoming shift:   [phone]       Activity:  Activity Level: Up with Assistance  Number times ambulated in hallways past shift: 0  Number of times OOB to chair past shift: 1    Cardiac:   Cardiac Monitoring: No      Cardiac Rhythm:  (PACED)    Access:   Current line(s): PIV     Genitourinary:   Urinary status: voiding    Respiratory:   O2 Device: None (Room air)  Chronic home O2 use?: NO  Incentive spirometer at bedside: NO     GI:  Last Bowel Movement Date: 01/27/20  Current diet:  DIET ONE TIME MESSAGE  ADULT DIET Regular  Passing flatus: YES  Tolerating current diet: YES       Pain Management:   Patient states pain is manageable on current regimen: YES    Skin:  Braden Score: 17  Interventions: float heels    Patient Safety:  Fall Score: Total Score: 2  Interventions: gripper socks and pt to call before getting OOB       Length of Stay:  Expected LOS: 2d 21h  Actual LOS: 2      Wyvonne Lenz, RN

## 2020-01-30 NOTE — Progress Notes (Signed)
Progress Notes by Juanell Fairly, NP at 01/30/20 1444                Author: Juanell Fairly, NP  Service: Nurse Practitioner  Author Type: Nurse Practitioner       Filed: 01/30/20 1456  Date of Service: 01/30/20 1444  Status: Signed          Editor: Juanell Fairly, NP (Nurse Practitioner)                       I reviewed pertinent labs and imaging, and discussed /agreed on the plan of care with Dr. Riley Kill.         Hospitalist Progress Note      NAME: Michael Lozano    DOB:  Jul 19, 1934    MRN:  [id number]          Assessment / Plan:     85 year old male with a history of diabetes, gout, neuropathy that presented to the emergency room 12/13 with complaints of right foot pain and was admitted  for management of a diabetic wound to his right foot.      #1. Diabetic ulcer of right midfoot associated with type II Diabetes   - s/p ID 12-15   - Normal white count, afebrile   - Wound care, follow cultures      #2. DM II   - A1C of 5.7   - Blood sugars are well controlled currently with just correctional SSI   - Continue to monitor      #3. Hx Gout- not in flare    -Supportive care       #4. Hx Neuropathy    - Continue Gabapentin          25.0 - 29.9 Overweight  / Body mass index is 27.26 kg/m??.      Estimated discharge date: December 17   Barriers:      Code status: Full   Prophylaxis: Lovenox   Recommended Disposition: Home with Therapy if indicated          Subjective:        Chief Complaint / Reason for Physician Visit   Pt is seen laying in bed, eating graham crackers, s/p I&D. He is without complaints. Discussed plan of care with the patient. He verbalized understanding. Of note, the patient denies a history of diabetes.         Review of Systems:           Symptom  Y/N  Comments    Symptom  Y/N  Comments             Fever/Chills  N      Chest Pain  N               Poor Appetite  N      Edema  N               Cough  N      Abdominal Pain  N       Sputum  N      Joint Pain  N       SOB/DOE  N       Pruritis/Rash  N       Nausea/vomit  N      Tolerating PT/OT  N       Diarrhea  N      Tolerating Diet  N  Constipation  N      Other  N             Could NOT obtain due to:  N/A          Objective:        VITALS:    Last 24hrs VS reviewed since prior progress note. Most recent are:   Patient Vitals for the past 24 hrs:            Temp  Pulse  Resp  BP  SpO2            01/30/20 1417  97.4 ??F (36.3 ??C)  60  16  (!) 128/58  100 %            01/30/20 1400  --  62  12  (!) 116/56  97 %     01/30/20 1350  --  63  18  (!) 115/55  97 %     01/30/20 1340  --  63  23  (!) 117/59  96 %     01/30/20 1335  --  62  20  (!) 113/54  97 %     01/30/20 1330  --  61  11  (!) 103/49  97 %     01/30/20 1326  97.6 ??F (36.4 ??C)  62  9  (!) 108/55  98 %     01/30/20 1325  97.6 ??F (36.4 ??C)  60  12  (!) 108/55  98 %     01/30/20 1158  97.7 ??F (36.5 ??C)  63  18  119/73  96 %     01/30/20 0738  97.7 ??F (36.5 ??C)  60  18  (!) 108/54  100 %     01/29/20 2339  98.3 ??F (36.8 ??C)  63  18  (!) 122/58  100 %     01/29/20 1926  97.5 ??F (36.4 ??C)  65  18  (!) 127/55  100 %            01/29/20 1509  98.2 ??F (36.8 ??C)  60  18  126/61  100 %           Intake/Output Summary (Last 24 hours) at 01/30/2020 1444   Last data filed at 01/30/2020 1315     Gross per 24 hour        Intake  250 ml        Output  --        Net  250 ml            I had a face to face encounter and independently examined this patient on 01/30/2020, as outlined below:   PHYSICAL EXAM:   General: Alert, cooperative, no acute distress     EENT:  Anicteric sclerae. MMM   Resp:  CTA bilaterally, no wheezing or rales.  No accessory muscle use   CV:  Regular  rhythm,  No edema   GI:  Soft, Non distended, Non tender.  +Bowel sounds   Neurologic:  Alert and oriented X 3, normal speech,    Psych:   Good insight. Not anxious nor agitated   Skin:  No rashes.  No jaundice. Dressing to RLE clean, dry and intact.       Reviewed most current lab test results and cultures  YES   Reviewed  most current radiology test results   YES   Review and summation of old records today    NO  Reviewed patient's current orders and MAR    YES   PMH/SH reviewed - no change compared to H&P   ________________________________________________________________________   Care Plan discussed with:           Comments         Patient  Y           Family              RN         Care Manager  Y           Consultant                                Y  Multidiciplinary team rounds were held today with case manager, nursing, pharmacist and clinical coordinator.  Patient's plan of care was discussed; medications  were reviewed and discharge planning was addressed.       ________________________________________________________________________      Total CRITICAL CARE TIME Spent:   Minutes non procedure based              Comments         >50% of visit spent in counseling and coordination of care  Y       ________________________________________________________________________   Juanell Fairly, NP       Procedures: see electronic medical records for all procedures/Xrays and details which were not copied into this note but were reviewed prior to creation of Plan.        LABS:   I reviewed today's most current labs and imaging studies.   Pertinent labs include:     Recent Labs            01/29/20   0349  01/28/20   1004     WBC  5.5  5.1     HGB  10.8*  11.9*     HCT  33.6*  36.7         PLT  236  232          Recent Labs            01/29/20   0349  01/28/20   1238     NA  140  139     K  3.7  4.0     CL  110*  110*     CO2  23  25     GLU  80  104*     BUN  13  12     CREA  1.26  1.45*     CA  9.4  9.8     ALB   --   3.3*     TBILI   --   0.4         ALT   --   12           Signed: Juanell Fairly, NP

## 2020-01-30 NOTE — Op Note (Signed)
Brief Postoperative Note    Patient: Michael Lozano  Date of Birth: 06-13-1934  MRN: [id number]    Date of Procedure: 01/30/2020     Pre-Op Diagnosis: INFECTED FOOT    Post-Op Diagnosis: Same     Procedure(s):  INCISION AND DRAINAGE RIGHT FOOT    Surgeon(s):  Alita Chyle, DPM    Surgical Assistant: None    Anesthesia: MAC     Estimated Blood Loss (mL): 39m    Complications: None    Specimens:   ID Type Source Tests Collected by Time Destination   1 :     NAlita Chyle DPM 01/30/2020 1407 Pathology   1 : ulcer right foot Wound Foot, Right CULTURE, ANAEROBIC, CULTURE, WOUND [place]/15/2021 1307 Microbiology        Implants: None    Drains: None    Findings: Granular ulcer right foot    Electronically Signed by LAlita Chyle DPM on 01/30/2020 at 2:13 PM

## 2020-01-30 NOTE — Progress Notes (Signed)
Patient is being seen by Dr. Marvell Fuller   Will sign off on this patient

## 2020-01-30 NOTE — Anesthesia Post-Procedure Evaluation (Signed)
Procedure(s):  INCISION AND DRAINAGE RIGHT FOOT.    MAC, general - backup    Anesthesia Post Evaluation      Multimodal analgesia: multimodal analgesia used between 6 hours prior to anesthesia start to PACU discharge  Patient location during evaluation: PACU  Level of consciousness: sleepy but conscious  Pain management: adequate  Airway patency: patent  Anesthetic complications: no  Cardiovascular status: acceptable  Respiratory status: acceptable  Hydration status: acceptable  Comments: +Post-Anesthesia Evaluation and Assessment    Patient: Michael Lozano MRN: [id number]  SSN: [id number]   Date of Birth: August 11, 1934  Age: 85 y.o.  Sex: male      Cardiovascular Function/Vital Signs    BP (!) 116/56 (BP 1 Location: Right upper arm, BP Patient Position: At rest)    Pulse 62    Temp 36.4 ??C (97.6 ??F)    Resp 12    Ht 6' (1.829 m)    Wt 91.2 kg (201 lb)    SpO2 97%    BMI 27.26 kg/m??     Patient is status post Procedure(s):  INCISION AND DRAINAGE RIGHT FOOT.    Nausea/Vomiting: Controlled.    Postoperative hydration reviewed and adequate.    Pain:  Pain Scale 1: Numeric (0 - 10) (01/30/20 1400)  Pain Intensity 1: 0 (01/30/20 1400)   Managed.    Neurological Status:   Neuro (WDL): Within Defined Limits (01/30/20 1350)   At baseline.    Mental Status and Level of Consciousness: Arousable.    Pulmonary Status:   O2 Device: None (Room air) (01/30/20 1400)   Adequate oxygenation and airway patent.    Complications related to anesthesia: None    Post-anesthesia assessment completed. No concerns.    Signed By: Joyice Faster, MD    01/30/2020  Post anesthesia nausea and vomiting:  controlled  Final Post Anesthesia Temperature Assessment:  Normothermia (36.0-37.5 degrees C)      INITIAL Post-op Vital signs:   Vitals Value Taken Time   BP 116/56 01/30/20 1405   Temp 36.4 ??C (97.6 ??F) 01/30/20 1326   Pulse 62 01/30/20 1407   Resp 12 01/30/20 1407   SpO2 97 % 01/30/20 1407   Vitals shown include unvalidated device data.

## 2020-01-30 NOTE — Progress Notes (Signed)
.  TRANSFER - OUT REPORT:    Verbal report given to Tracy(name) on [employer]  being transferred to OR(unit) for routine progression of care       Report consisted of patient's Situation, Background, Assessment and   Recommendations(SBAR).     Information from the following report(s) SBAR, Intake/Output, MAR and Recent Results was reviewed with the receiving nurse.    Lines:   Peripheral IV 01/28/20 Left Arm (Active)   Site Assessment Clean, dry, & intact 01/30/20 1202   Phlebitis Assessment 0 01/30/20 1202   Infiltration Assessment 0 01/30/20 1202   Dressing Status Clean, dry, & intact 01/30/20 1202   Dressing Type Transparent;Tape 01/30/20 1202   Hub Color/Line Status Pink;Infusing;Patent;Flushed 01/30/20 1202   Alcohol Cap Used Yes 01/30/20 0300        Opportunity for questions and clarification was provided.      Patient transported with:   [employer]

## 2020-01-30 NOTE — Progress Notes (Signed)
Occupational Therapy  Chart reviewed; defer OT eval until after procedure scheduled for today. Biagio Borg OTR/L

## 2020-01-30 NOTE — Interval H&P Note (Signed)
 TRANSFER - IN REPORT:    Verbal report received from Tica Harrison RN(name) on [employer]  being received from Oncology Room 1116(unit) for ordered procedure      Report consisted of patient's Situation, Background, Assessment and   Recommendations(SBAR).     Information from the following report(s) SBAR, Kardex, Intake/Output, MAR, Recent Results, Med Rec Status and Procedure Verification was reviewed with the receiving nurse.    Opportunity for questions and clarification was provided.      Assessment completed upon patient's arrival to unit and care assumed.

## 2020-01-31 LAB — GLUCOSE, POC
Glucose (POC): 112 mg/dL (ref 65–117)
Glucose (POC): 99 mg/dL (ref 65–117)
Glucose (POC): 118 mg/dL — ABNORMAL HIGH (ref 65–117)
Glucose (POC): 153 mg/dL — ABNORMAL HIGH (ref 65–117)

## 2020-01-31 LAB — CBC WITH AUTOMATED DIFF
ABS. BASOPHILS: 0 10*3/uL (ref 0.0–0.1)
ABS. EOSINOPHILS: 0.4 10*3/uL (ref 0.0–0.4)
ABS. IMM. GRANS.: 0 10*3/uL (ref 0.00–0.04)
ABS. LYMPHOCYTES: 1.1 10*3/uL (ref 0.8–3.5)
ABS. MONOCYTES: 0.4 10*3/uL (ref 0.0–1.0)
ABS. NEUTROPHILS: 2.7 10*3/uL (ref 1.8–8.0)
ABSOLUTE NRBC: 0 10*3/uL (ref 0.00–0.01)
BASOPHILS: 0 % (ref 0–1)
EOSINOPHILS: 8 % — ABNORMAL HIGH (ref 0–7)
HCT: 35.3 % — ABNORMAL LOW (ref 36.6–50.3)
HGB: 11.4 g/dL — ABNORMAL LOW (ref 12.1–17.0)
IMMATURE GRANULOCYTES: 0 % (ref 0.0–0.5)
LYMPHOCYTES: 24 % (ref 12–49)
MCH: 25.4 PG — ABNORMAL LOW (ref 26.0–34.0)
MCHC: 32.3 g/dL (ref 30.0–36.5)
MCV: 78.8 FL — ABNORMAL LOW (ref 80.0–99.0)
MONOCYTES: 8 % (ref 5–13)
MPV: 10.9 FL (ref 8.9–12.9)
NEUTROPHILS: 60 % (ref 32–75)
NRBC: 0 PER 100 WBC
PLATELET: 230 10*3/uL (ref 150–400)
RBC: 4.48 M/uL (ref 4.10–5.70)
RDW: 18.1 % — ABNORMAL HIGH (ref 11.5–14.5)
WBC: 4.6 10*3/uL (ref 4.1–11.1)

## 2020-01-31 LAB — METABOLIC PANEL, BASIC
Anion gap: 7 mmol/L (ref 5–15)
BUN/Creatinine ratio: 13 (ref 12–20)
BUN: 17 MG/DL (ref 6–20)
CO2: 23 mmol/L (ref 21–32)
Calcium: 9.5 MG/DL (ref 8.5–10.1)
Chloride: 108 mmol/L (ref 97–108)
Creatinine: 1.32 MG/DL — ABNORMAL HIGH (ref 0.70–1.30)
GFR est AA: >60 mL/min/{1.73_m2} (ref >60)
GFR est non-AA: 52 mL/min/{1.73_m2} — ABNORMAL LOW (ref >60)
Glucose: 95 mg/dL (ref 65–100)
Potassium: 4.3 mmol/L (ref 3.5–5.1)
Sodium: 138 mmol/L (ref 136–145)

## 2020-01-31 LAB — MAGNESIUM
Magnesium: 2.2 mg/dL (ref 1.6–2.4)
Magnesium: 2.2 mg/dL (ref 1.6–2.4)

## 2020-01-31 LAB — POCT GLUCOSE
POC Glucose: 112 mg/dL (ref 65–117)
POC Glucose: 99 mg/dL (ref 65–117)
POC Glucose: 118 mg/dL — ABNORMAL HIGH (ref 65–117)
POC Glucose: 153 mg/dL — ABNORMAL HIGH (ref 65–117)

## 2020-01-31 LAB — CBC WITH AUTO DIFFERENTIAL
Basophils %: 0 % (ref 0–1)
Basophils Absolute: 0 10*3/uL (ref 0.0–0.1)
Eosinophils %: 8 % — ABNORMAL HIGH (ref 0–7)
Eosinophils Absolute: 0.4 10*3/uL (ref 0.0–0.4)
Granulocyte Absolute Count: 0 10*3/uL (ref 0.00–0.04)
Hematocrit: 35.3 % — ABNORMAL LOW (ref 36.6–50.3)
Hemoglobin: 11.4 g/dL — ABNORMAL LOW (ref 12.1–17.0)
Immature Granulocytes: 0 % (ref 0.0–0.5)
Lymphocytes %: 24 % (ref 12–49)
Lymphocytes Absolute: 1.1 10*3/uL (ref 0.8–3.5)
MCH: 25.4 PG — ABNORMAL LOW (ref 26.0–34.0)
MCHC: 32.3 g/dL (ref 30.0–36.5)
MCV: 78.8 FL — ABNORMAL LOW (ref 80.0–99.0)
MPV: 10.9 FL (ref 8.9–12.9)
Monocytes %: 8 % (ref 5–13)
Monocytes Absolute: 0.4 10*3/uL (ref 0.0–1.0)
NRBC Absolute: 0 10*3/uL (ref 0.00–0.01)
Neutrophils %: 60 % (ref 32–75)
Neutrophils Absolute: 2.7 10*3/uL (ref 1.8–8.0)
Nucleated RBCs: 0 PER 100 WBC
Platelets: 230 10*3/uL (ref 150–400)
RBC: 4.48 M/uL (ref 4.10–5.70)
RDW: 18.1 % — ABNORMAL HIGH (ref 11.5–14.5)
WBC: 4.6 10*3/uL (ref 4.1–11.1)

## 2020-01-31 LAB — BASIC METABOLIC PANEL
Anion Gap: 7 mmol/L (ref 5–15)
BUN: 17 MG/DL (ref 6–20)
Bun/Cre Ratio: 13 (ref 12–20)
CO2: 23 mmol/L (ref 21–32)
Calcium: 9.5 MG/DL (ref 8.5–10.1)
Chloride: 108 mmol/L (ref 97–108)
Creatinine: 1.32 MG/DL — ABNORMAL HIGH (ref 0.70–1.30)
EGFR IF NonAfrican American: 52 mL/min/{1.73_m2} — ABNORMAL LOW (ref >60)
GFR African American: >60 mL/min/{1.73_m2} (ref >60)
Glucose: 95 mg/dL (ref 65–100)
Potassium: 4.3 mmol/L (ref 3.5–5.1)
Sodium: 138 mmol/L (ref 136–145)

## 2020-01-31 MED FILL — GABAPENTIN 100 MG CAP: 100 mg | ORAL | Qty: 2

## 2020-01-31 MED FILL — ENOXAPARIN 40 MG/0.4 ML SUB-Q SYRINGE: 40 mg/0.4 mL | SUBCUTANEOUS | Qty: 0.4

## 2020-01-31 NOTE — Progress Notes (Signed)
Progress Notes by Juanell FairlyKarabin, Joeli Fenner D, NP at 01/31/20 0825                Author: Juanell FairlyKarabin, Latyra Jaye D, NP  Service: Nurse Practitioner  Author Type: Nurse Practitioner       Filed: 01/31/20 1314  Date of Service: 01/31/20 0825  Status: Addendum          Editor: Juanell FairlyKarabin, Bernetta Sutley D, NP (Nurse Practitioner)          Related Notes: Original Note by Juanell FairlyKarabin, Alisia Vanengen D, NP (Nurse Practitioner) filed at 01/31/20  [phone]                       I reviewed pertinent labs and imaging, and discussed /agreed on the plan of care with Dr. Riley KillHardi.         Hospitalist Progress Note      NAME: Michael Lozano    DOB:  [birth year]    MRN:  [id number]          Assessment / Plan:     85 year old male with a history of diabetes, gout, neuropathy that presented to the emergency room 12/13 with complaints of right foot pain and was  admitted for management of a diabetic wound to his right foot.   ??   #1. Diabetic ulcer of right midfoot associated with type II Diabetes   - s/p ID 12-15   - WBC 4.6, remains afebrile   - Wound care, follow cultures, which are still pending   - PT/OT    ??   #2. DM II   - A1C of 5.7   - Blood sugars are well controlled currently with just correctional SSI   - Continue to monitor   ??   #3. Hx Gout- not in flare    -Supportive care    ??   #4. Hx Neuropathy    - Continue Gabapentin          25.0 - 29.9 Overweight  / Body mass index is 27.26 kg/m??.      Estimated discharge date: December 18   Barriers:      Code status: Full   Prophylaxis: Lovenox   Recommended Disposition: pending eval by PT/OT          Subjective:        Chief Complaint / Reason for Physician Visit   The patient is seen this morning laying in bed. He is asking about breakfast. He denies pain, any issues with bowels or bladder. Updated to plan of care. Pt verbalized understanding.      Review of Systems:           Symptom  Y/N  Comments    Symptom  Y/N  Comments             Fever/Chills  N      Chest Pain  N               Poor Appetite  N       Edema  N               Cough  N      Abdominal Pain  N       Sputum  N      Joint Pain  N       SOB/DOE  N      Pruritis/Rash  N       Nausea/vomit  N      Tolerating PT/OT  -  Diarrhea  N      Tolerating Diet  Y               Constipation  N      Other               Could NOT obtain due to:  N/A          Objective:        VITALS:    Last 24hrs VS reviewed since prior progress note. Most recent are:   Patient Vitals for the past 24 hrs:            Temp  Pulse  Resp  BP  SpO2            01/31/20 0759  97.4 ??F (36.3 ??C)  60  18  114/62  100 %            01/30/20 2327  98.4 ??F (36.9 ??C)  63  18  (!) 119/57  100 %     01/30/20 2029  97.1 ??F (36.2 ??C)  64  17  125/61  100 %     01/30/20 1417  97.4 ??F (36.3 ??C)  60  16  (!) 128/58  100 %     01/30/20 1400  --  62  12  (!) 116/56  97 %     01/30/20 1350  --  63  18  (!) 115/55  97 %     01/30/20 1340  --  63  23  (!) 117/59  96 %     01/30/20 1335  --  62  20  (!) 113/54  97 %     01/30/20 1330  --  61  11  (!) 103/49  97 %     01/30/20 1326  97.6 ??F (36.4 ??C)  62  9  (!) 108/55  98 %     01/30/20 1325  97.6 ??F (36.4 ??C)  60  12  (!) 108/55  98 %            01/30/20 1158  97.7 ??F (36.5 ??C)  63  18  119/73  96 %           Intake/Output Summary (Last 24 hours) at 01/31/2020 0825   Last data filed at 01/30/2020 1315     Gross per 24 hour        Intake  250 ml        Output  --        Net  250 ml            I had a face to face encounter and independently examined this patient on 01/31/2020, as outlined below:   PHYSICAL EXAM:   General: Alert, cooperative, no acute distress     EENT:  Anicteric sclerae. MMM   Resp:  CTA bilaterally, no wheezing or rales.  No accessory muscle use   CV:  Regular  rhythm,  No edema   GI:  Soft, Non distended, Non tender. Umbilical hernia noted (pt states has been there since he was a child)  +Bowel sounds   Neurologic:  Alert and oriented X 3, normal speech,    Psych:   Good insight. Not anxious nor agitated   Skin:  No rashes.  No  jaundice. RLE with dressing noted to be clean, dry and intact.    PVS:  Unable to palpate R foot pulses due to dressing, but the skin is warm and  well perfused, and he is able to move extremity.       Reviewed most current lab test results and cultures  YES   Reviewed most current radiology test results   YES   Review and summation of old records today    NO   Reviewed patient's current orders and MAR    YES   PMH/SH reviewed - no change compared to H&P   ________________________________________________________________________   Care Plan discussed with:           Comments         Patient  Y           Family              RN         Care Manager  Y           Consultant                                Y  Multidiciplinary team rounds were held today with case manager, nursing, pharmacist and clinical coordinator.  Patient's plan of care was discussed; medications  were reviewed and discharge planning was addressed.       ________________________________________________________________________      Total CRITICAL CARE TIME Spent:   Minutes non procedure based              Comments         >50% of visit spent in counseling and coordination of care  Y       ________________________________________________________________________   Juanell Fairly, NP       Procedures: see electronic medical records for all procedures/Xrays and details which were not copied into this note but were reviewed prior to creation of Plan.        LABS:   I reviewed today's most current labs and imaging studies.   Pertinent labs include:     Recent Labs             01/31/20   0324  01/29/20   0349  01/28/20   1004     WBC  4.6  5.5  5.1     HGB  11.4*  10.8*  11.9*     HCT  35.3*  33.6*  36.7          PLT  230  236  232          Recent Labs             01/31/20   0324  01/29/20   0349  01/28/20   1238     NA  138  140  139     K  4.3  3.7  4.0     CL  108  110*  110*     CO2  23  23  25      GLU  95  80  104*     BUN  17  13  12      CREA  1.32*   1.26  1.45*     CA  9.5  9.4  9.8     MG  2.2   --    --      ALB   --    --   3.3*     TBILI   --    --   0.4          ALT   --    --  12           Signed: Juanell Fairly, NP

## 2020-01-31 NOTE — Progress Notes (Signed)
Physical therapy  PT evaluation completed; full note to follow.  Patient moves well, requiring only SBA/supervision for ambulation.  Patient is quite impulsive, however, with decreased safety awareness.  Recommend increased supervision at home, with HHPT.
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
## 2020-01-31 NOTE — Progress Notes (Signed)
CM attempted to complete room assessment with pt.  However, pt wasn't able to answer questions to the best of his knowledged and asked if family to be contacted.      CM attempted contact with pt's family listed on EC, however, attempts were unsuccessful and CM left voice mails requesting a return call.  CM will continue to follow.    Michael Lozano, MSW, [place]  [id number]

## 2020-01-31 NOTE — Progress Notes (Signed)
Bedside and Verbal shift change report given to Stann Mainland RN (oncoming nurse) by Durwin Reges (offgoing nurse). Report included the following information SBAR, Kardex, Procedure Summary, Intake/Output, MAR and Recent Results. Patient rested well this night sleeping at long intervals. No complaints voiced.

## 2020-01-31 NOTE — Progress Notes (Signed)
Problem: Pressure Injury - Risk of  Goal: *Prevention of pressure injury  Description: Document Braden Scale and appropriate interventions in the flowsheet.  Outcome: Progressing Towards Goal  Note: Pressure Injury Interventions:  Sensory Interventions: Assess changes in LOC,Check visual cues for pain,Maintain/enhance activity level,Pressure redistribution bed/mattress (bed type)    Moisture Interventions: Absorbent underpads,Contain wound drainage,Maintain skin hydration (lotion/cream),Moisture barrier    Activity Interventions: Pressure redistribution bed/mattress(bed type),PT/OT evaluation    Mobility Interventions: Pressure redistribution bed/mattress (bed type),PT/OT evaluation    Nutrition Interventions: Document food/fluid/supplement intake    Friction and Shear Interventions: Feet elevated on foot rest,HOB 30 degrees or less,Lift sheet,Minimize layers                Problem: Falls - Risk of  Goal: *Absence of Falls  Description: Document Schmid Fall Risk and appropriate interventions in the flowsheet.  Outcome: Progressing Towards Goal  Note: Fall Risk Interventions:  Mobility Interventions: Bed/chair exit alarm,Communicate number of staff needed for ambulation/transfer,Patient to call before getting OOB,PT Consult for mobility concerns         Medication Interventions: Patient to call before getting OOB,Teach patient to arise slowly    Elimination Interventions: Call light in reach,Patient to call for help with toileting needs
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
## 2020-01-31 NOTE — Progress Notes (Signed)
Pharmacy Medication Reconciliation     The patient was interviewed regarding current PTA medication list, use and drug allergies.    Allergy Update: Hydrocodone, Morphine, and Oxycodone    Recommendations/Findings:   The following amendments were made to the patient's active medication list on file at [facility]:     1) Additions: none    2) Deletions: none    3) Changes: none      Pertinent Findings: patient said he only has one prescription (Lipitor)      Prior to Admission Medications   Prescriptions Last Dose Informant Taking?   acetaminophen (TYLENOL) 325 mg tablet  Self No   Sig: Take 2 Tablets by mouth every four (4) hours as needed for Pain.   atorvastatin (LIPITOR) 40 mg tablet  Self No   Sig: Take 40 mg by mouth daily.   polyethylene glycol (Miralax) 17 gram/dose powder   No   Sig: Take 17 g by mouth daily.   Patient not taking: Reported on 10/08/2019      Facility-Administered Medications: None        Thank you,  Derrell Lolling, [facility]

## 2020-02-01 LAB — GLUCOSE, POC
Glucose (POC): 106 mg/dL (ref 65–117)
Glucose (POC): 114 mg/dL (ref 65–117)
Glucose (POC): 94 mg/dL (ref 65–117)
Glucose (POC): 106 mg/dL (ref 65–117)

## 2020-02-01 LAB — CULTURE, ANAEROBIC

## 2020-02-01 LAB — POCT GLUCOSE
POC Glucose: 106 mg/dL (ref 65–117)
POC Glucose: 114 mg/dL (ref 65–117)
POC Glucose: 94 mg/dL (ref 65–117)
POC Glucose: 106 mg/dL (ref 65–117)

## 2020-02-01 MED ORDER — CALCIUM CARBONATE 200 MG (500 MG) CHEWABLE TAB
200 mg calcium (500 mg) | Freq: Four times a day (QID) | ORAL | Status: DC | PRN
Start: 2020-02-01 — End: 2020-02-02
  Administered 2020-02-01: 02:00:00 via ORAL

## 2020-02-01 MED ORDER — ALUM-MAG HYDROXIDE-SIMETH 200 MG-200 MG-20 MG/5 ML ORAL SUSP
200-200-20 mg/5 mL | ORAL | Status: DC | PRN
Start: 2020-02-01 — End: 2020-02-02
  Administered 2020-02-01 – 2020-02-02 (×3): via ORAL

## 2020-02-01 MED FILL — GABAPENTIN 100 MG CAP: 100 mg | ORAL | Qty: 2

## 2020-02-01 MED FILL — MAG-AL PLUS 200 MG-200 MG-20 MG/5 ML ORAL SUSPENSION: 200-200-20 mg/5 mL | ORAL | Qty: 30

## 2020-02-01 MED FILL — CALCIUM CARBONATE 200 MG (500 MG) CHEWABLE TAB: 200 mg calcium (500 mg) | ORAL | Qty: 1

## 2020-02-01 MED FILL — ENOXAPARIN 40 MG/0.4 ML SUB-Q SYRINGE: 40 mg/0.4 mL | SUBCUTANEOUS | Qty: 0.4
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
## 2020-02-01 NOTE — Progress Notes (Signed)
Spiritual Care Assessment/Progress Note  [facility]      NAME: Michael Lozano      MRN: [id number]  AGE: 85 y.o. SEX: male  Religious Affiliation: Christian   Language: English     02/01/2020     Total Time (in minutes): 13     Spiritual Assessment begun in [facility] through conversation with:         [x] Patient        []  Family    []  Friend(s)        Reason for Consult: Initial/Spiritual assessment, patient floor     Spiritual beliefs: (Please include comment if needed)     [x]  Identifies with a faith tradition:         []  Supported by a faith community:            []  Claims no spiritual orientation:           []  Seeking spiritual identity:                []  Adheres to an individual form of spirituality:           []  Not able to assess:                           Identified resources for coping:      []  Prayer                               []  Music                  []  Guided Imagery     [x]  Family/friends                 []  Pet visits     []  Devotional reading                         []  Unknown     []  Other:                                           Interventions offered during this visit: (See comments for more details)    Patient Interventions: Affirmation of emotions/emotional suffering,Affirmation of faith,Catharsis/review of pertinent events in supportive environment,Iconic (affirming the presence of God/Higher Power),Initial/Spiritual assessment, patient floor,Normalization of emotional/spiritual concerns,Prayer (assurance of),Religious beliefs/image of God discussed           Plan of Care:     []  Support spiritual and/or cultural needs    []  Support AMD and/or advance care planning process      []  Support grieving process   []  Coordinate Rites and/or Rituals    []  Coordination with community clergy   [x]  No spiritual needs identified at this time   []  Detailed Plan of Care below (See Comments)  []  Make referral to Music Therapy  []  Make referral to Pet Therapy     []  Make  referral to Addiction services  []  Make referral to [facility] Passages  []  Make referral to Spiritual Care Partner  []  No future visits requested        [x]  Contact Spiritual Care for further referrals     Comments:   Reviewed chart prior to visit on Oncology unit  for spiritual assessment.  No family/friends present. Patient was seated in recliner finishing his breakfast.  Provided supportive listening presence as he offered life review.  He shared his grandfather and grandmother had 21 children together.  He is one of 6 and he reported he has 14 children. He grew up attending church, still believes in God, but no longer belongs to a church.  He expressed no spiritual needs or concerns at this time.  Offered assurance of prayer and words of encouragement.     Barrie Lyme, MPS, [facility], Staff Chaplain  [facility] Paging Service  [phone])

## 2020-02-01 NOTE — Progress Notes (Signed)
End of Shift Note    Bedside shift change report given to Lelon Mast RN (oncoming nurse) by Vernia Buff (offgoing nurse).  Report included the following information SBAR, Kardex, Procedure Summary, Intake/Output, MAR and Recent Results    Shift worked:  7p-7a     Shift summary and any significant changes:     patient rested well this night sleeping at long intervals up to bathroom ad lib NWB     Concerns for physician to address:  none           Activity:  Activity Level: Up ad lib  Number times ambulated in hallways past shift: 0  Number of times OOB to chair past shift: 0    Cardiac:   Cardiac Monitoring: No      Cardiac Rhythm:  (PACED)    Access:   Current line(s): PIV     Genitourinary:   Urinary status: voiding    Respiratory:   O2 Device: None (Room air)  Chronic home O2 use?: NO  Incentive spirometer at bedside: NO     GI:  Last Bowel Movement Date: 01/27/20  Current diet:  DIET ONE TIME MESSAGE  ADULT DIET Regular  Tolerating current diet: YES       Pain Management:   Patient states pain is manageable on current regimen: YES    Skin:  Braden Score: 19  Interventions: PT/OT consult    Patient Safety:  Fall Score: Total Score: 2  Interventions: assistive device (walker, cane, etc), gripper socks and pt to call before getting OOB       Length of Stay:  Expected LOS: 2d 21h  Actual LOS: 4      Vernia Buff

## 2020-02-01 NOTE — Progress Notes (Signed)
Problem: Falls - Risk of  Goal: *Absence of Falls  Description: Document Michael Lozano Fall Risk and appropriate interventions in the flowsheet.  Outcome: Progressing Towards Goal  Note: Fall Risk Interventions:  Mobility Interventions: Communicate number of staff needed for ambulation/transfer,Patient to call before getting OOB,PT Consult for mobility concerns         Medication Interventions: Patient to call before getting OOB    Elimination Interventions: Call light in reach,Patient to call for help with toileting needs

## 2020-02-01 NOTE — Progress Notes (Signed)
Progress Notes by Juanell Fairly, NP at 02/01/20 [phone]                Author: Juanell Fairly, NP  Service: Nurse Practitioner  Author Type: Nurse Practitioner       Filed: 02/01/20 0839  Date of Service: 02/01/20 0744  Status: Signed          Editor: Juanell Fairly, NP (Nurse Practitioner)                       I reviewed pertinent labs and imaging, and discussed /agreed on the plan of care with Dr. Riley Kill.         Hospitalist Progress Note      NAME: Michael Lozano    DOB:  1934-08-20    MRN:  [id number]          Assessment / Plan:     85 year old male with a history of diabetes, gout, neuropathy that presented to the emergency room??12/13 with complaints of right foot pain??and  was admitted for management of a diabetic wound to his right foot.   ??   #1. Diabetic ulcer of right midfoot associated with type II Diabetes   - s/p ID 12-15   - WBC 4.6 12-17, continues to remain afebrile   - Wound care   - Culture results: LIGHT PROBABLE STAPHYLOCOCCUS SPECIES, COAGULASE NEGATIVE (MULTIPLE COLONY TYPES), SCANT ALPHA STREPTOCOCCUS??    - In the absence of an elevated white count or fevers, will hold off on antibiotics until full sensitivities/pathology is resulted- may have achieved adequate source control with the I&D itself    -Still pending path results   - Continue PT/OT    ??   #2. DM II   - A1C of 5.7   - Blood sugars remain well controlled currently with just correctional SSI   - Continue to monitor   ??   #3. Hx Gout- not in flare??   -Supportive care??   ??   #4. Hx Neuropathy??   - Continue Gabapentin??   ??   ??         25.0 - 29.9 Overweight  / Body mass index is 27.26 kg/m??.      Estimated discharge date: December 18   Barriers: Safe disposition- case management has been unable to discuss availability of supervision with family       Code status: Full   Prophylaxis: Lovenox   Recommended Disposition: Home with HH vs SNF          Subjective:        Chief Complaint / Reason for Physician Visit   Pt seen  for hospital f/u re: diabetic ulcer R foot.  He is seen laying in bed. Discussed plan of care. Pt verbalized understanding. Pt is eager to get back home and back to riding his motorcycle.  He  is equesting something for indigestion (pt states that this is a chronic issue) and is otherwise without complaints.    Review of Systems:           Symptom  Y/N  Comments    Symptom  Y/N  Comments             Fever/Chills  N      Chest Pain  N               Poor Appetite  N      Edema  N  Cough  N      Abdominal Pain  N       Sputum  N      Joint Pain  N       SOB/DOE  N      Pruritis/Rash  N       Nausea/vomit  N      Tolerating PT/OT  Y       Diarrhea  N      Tolerating Diet  Y               Constipation  N      Other  Y  indigestion           Could NOT obtain due to:  N/A          Objective:        VITALS:    Last 24hrs VS reviewed since prior progress note. Most recent are:   Patient Vitals for the past 24 hrs:            Temp  Pulse  Resp  BP  SpO2            01/31/20 2000  98.2 ??F (36.8 ??C)  66  18  (!) 130/57  99 %            01/31/20 1510  97.9 ??F (36.6 ??C)  73  18  (!) 120/55  100 %     01/31/20 0900  --  76  --  113/64  100 %            01/31/20 0759  97.4 ??F (36.3 ??C)  60  18  114/62  100 %        No intake or output data in the 24 hours ending 02/01/20 0744       I had a face to face encounter and independently examined this patient on 02/01/2020, as outlined below:   PHYSICAL EXAM:   General:  Alert, cooperative, no acute distress     EENT:  Anicteric sclerae. MMM   Resp:  CTA bilaterally, no wheezing or rales.  No accessory muscle use   CV:  Regular  rhythm,  No edema   GI:  Soft, Non distended, Non tender.  +Bowel sounds   Neurologic:  Alert and oriented X 3, normal speech,    Psych:   Good insight. Not anxious nor agitated   Skin:  No rashes.  No jaundice, Dressing RLE clean, dry, and intact- skin is warm and dry       Reviewed most current lab test results and cultures  YES   Reviewed most  current radiology test results   YES   Review and summation of old records today    NO   Reviewed patient's current orders and MAR    YES   PMH/SH reviewed - no change compared to H&P   ________________________________________________________________________   Care Plan discussed with:           Comments         Patient  Y           Family              RN         Care Social worker  Y  Multidiciplinary team rounds were held today with case manager, nursing, pharmacist and clinical coordinator.  Patient's plan of care was discussed; medications  were reviewed and discharge planning was addressed.       ________________________________________________________________________      Total CRITICAL CARE TIME Spent:   Minutes non procedure based              Comments         >50% of visit spent in counseling and coordination of care  Y       ________________________________________________________________________   Juanell Fairly, NP       Procedures: see electronic medical records for all procedures/Xrays and details which were not copied into this note but were reviewed prior to creation of Plan.        LABS:   I reviewed today's most current labs and imaging studies.   Pertinent labs include:     Recent Labs           01/31/20   0324     WBC  4.6     HGB  11.4*     HCT  35.3*        PLT  230          Recent Labs           01/31/20   0324     NA  138     K  4.3     CL  108     CO2  23     GLU  95     BUN  17     CREA  1.32*     CA  9.5        MG  2.2           Signed: Juanell Fairly, NP

## 2020-02-01 NOTE — Progress Notes (Signed)
Patient is seen and is doing well, dressing changed this morning with Xeroform reapply, significant improvement with granular tissue seen and contracting nicely. Remaining of the forefoot using heel walking only surgical shoe. Leaving this dressing dry, clean, intact.    Reviewing the current culture and sensitivities.    Patient responding to treatment and meeting goals.    Dr. Marvell Fuller  [phone]

## 2020-02-01 NOTE — Progress Notes (Signed)
Occupational Therapy  Chart reviewed; cleared for tx; Patient will benefit from discharge to SNF for AFL and functional mobility training at ambulatory level with post op R foot; will need assist with wound care, nutrition etc as he primarily lives without caregiver present. Full note to follow.  Biagio Borg OTR/L

## 2020-02-01 NOTE — Progress Notes (Signed)
 Problem: Self Care Deficits Care Plan (Adult)  Goal: *Acute Goals and Plan of Care (Insert Text)  Description: FUNCTIONAL STATUS PRIOR TO ADMISSION:  Patient was modified independent using a single point cane for functional mobility. Reports fully I, driving both truck and motorcycle  Last admit he reports granddaughter did the driving. Reports he takes no meds and doesn't go to MD.  FAMILY CLARIFICATION NEEDED    HOME SUPPORT PRIOR TO ADMISSION: The patient lived with granddaughter but reports she is never home. Reports he doesn't need assistance at baseline (Last admission reports no assist except for granddaughter drives him to appointments. )  Occupational Therapy Goals  Initiated 01/31/2020  1.  Patient will perform grooming in standing LRD with modified independence within 7 day(s).  2.  Patient will perform bathing with modified independence within 7 day(s).  3.  Patient will perform upper body dressing and lower body dressing with modified independence within 7 day(s).  4.  Patient will perform toilet transfers with modified independence within 7 day(s).  5.  Patient will perform all aspects of toileting with modified independence within 7 day(s).  6.  Patient will participate in upper extremity therapeutic exercise/activities with modified independence for 5 minutes within 7 day(s).    7.  Patient will utilize energy conservation, fall prevention, pain management, WBAT with cast shoe only techniques during functional activities with verbal cues within 7 day(s).   8.  Patient will participate in cog screen to clarify current cognition in 2 days  Outcome: Progressing Towards Goal   OCCUPATIONAL THERAPY TREATMENT  Patient: Michael Lozano (85 y.o. male)  Date: 02/01/2020  Diagnosis: Foot ulcer (HCC) [L97.509] <principal problem not specified>  Procedure(s) (LRB):  INCISION AND DRAINAGE RIGHT FOOT (Right) 2 Days Post-Op  Precautions: Fall,WBAT (with post op shoe)  Chart, occupational therapy assessment, plan  of care, and goals were reviewed.    ASSESSMENT  Patient continues with skilled OT services and is progressing towards goals.  Patient received in bed willing to work with OT; Limited this session by decreased dynamic standing balance, partly caused by large post op dressing and persistent efforts to try to heel WB in post op shoe during standing ADL tasks. Fatigued but willing to sit up in chair post session. Irritiable over poor sleep last night so cog screen was deferred; will still benefit for that clarifcation     Current Level of Function Impacting Discharge (ADLs): mod I to SBA UE ADLs, CGA LE ADLs with need for repetition of training for safe use of post op shoe, placing straps any old way- trained need for smooth starps and walls of shoe to decrease risk for pressure areas once post op dressing is off.   S-CGA functional mobility no device  Other factors to consider for discharge: lies alone essentially         PLAN :  Patient continues to benefit from skilled intervention to address the above impairments.  Continue treatment per established plan of care to address goals.    Recommend with staff: reinforce use of post op shoe, with shoe on L foot to provide balanced walking     Recommend next OT session: cog screen    Recommendation for discharge: (in order for the patient to meet his/her long term goals)  Therapy up to 5 days/week in SNF setting    This discharge recommendation:  Has been made in collaboration with the attending provider and/or case management    IF patient discharges home will need  the following DME: transfer bench       SUBJECTIVE:   Patient stated "I need to go to rehab to take care of this wound and the medicine; I dont know anything about that." (Impaired cognition/do not believe patient is currently safe, home alone)    OBJECTIVE DATA SUMMARY:   Cognitive/Behavioral Status:  Neurologic State: Alert;Irritable (tangential)  Orientation Level: Oriented to place;Oriented to  person;Oriented to situation;Disoriented to time (disoriented to age; stated 89, 18, 53)  Cognition: Follows commands;Impulsive;Impaired decision making (high fall risk; follows commands with gentle encouragement)  Perception: Appears intact (able to read clock on wall)  Perseveration: No perseveration noted  Safety/Judgement: Decreased insight into deficits;Decreased awareness of need for safety;Decreased awareness of need for assistance;Awareness of environment    Functional Mobility and Transfers for ADLs:  Bed Mobility:   mod I    Transfers:     Functional Transfers  Toilet Transfer : Supervision;Additional time       Balance:  Sitting: Intact  Standing: Impaired;Without support  Standing - Static: Fair (off balance due to post op shoe; L boot donned; worse now)  Standing - Dynamic : Fair    ADL Intervention:  Feeding  Feeding Assistance: Independent    Grooming  Grooming Assistance: Stand-by assistance  Position Performed: Standing  Washing Face: Stand-by assistance  Washing Hands: Stand-by assistance  Brushing Teeth: Stand-by assistance  Brushing/Combing Hair: Stand-by assistance;Supervision (not great attention to quality but hair long & tangled)                   Lower Body Dressing Assistance  Dressing Assistance: Supervision;Minimum assistance (difficlty w/velcro shoe; 3 tries needed; impulsive)  Socks: Supervision (on L)  Shoes with Cloth Laces: Supervision (on L, 3 tries for velcro post op shoe; goals of shoe trained)  Leg Crossed Method Used: Yes  Position Performed: Seated edge of bed;Seated in chair         Cognitive Retraining  Attention to Task: Single task  Maintains Attention For (Time): 2 minutes (easily distracted by his own thoughts)  Following Commands: Follows one step commands/directions (2 step instructions not followed; does 1, then tangent arriv)  Safety/Judgement: Decreased insight into deficits;Decreased awareness of need for safety;Decreased awareness of need for assistance;Awareness  of environment      Pain:  No post op pain reports; reports he doesn't care if his back hurts after walking crooked from use of sock to walk w/post op shoe because the boot feels worse.    Activity Tolerance:   Fair, SpO2 stable on RA, and requires rest breaks    After treatment patient left in no apparent distress:   Sitting in chair, Call bell within reach, and Bed / chair alarm activated    COMMUNICATION/COLLABORATION:   The patient's plan of care was discussed with: Registered nurse.     Heron Pepper OTR/L  Time Calculation: 23 mins

## 2020-02-01 NOTE — Progress Notes (Signed)
Deficiency Message    Hi Dr Marvell Fuller, ??Please address a pending query on this account re: debridement excisional vs non-excisional. Thank you,   Charlyne Petrin RN, CDI ??              Patient is a dermal excisional debridement right foot    Dr. Marvell Fuller

## 2020-02-02 LAB — CULTURE, WOUND W GRAM STAIN
GRAM STAIN: NONE SEEN
Gram Stain Result: NONE SEEN

## 2020-02-02 LAB — GLUCOSE, POC
Glucose (POC): 99 mg/dL (ref 65–117)
Glucose (POC): 103 mg/dL (ref 65–117)

## 2020-02-02 LAB — POCT GLUCOSE
POC Glucose: 99 mg/dL (ref 65–117)
POC Glucose: 103 mg/dL (ref 65–117)

## 2020-02-02 MED ORDER — AMOXICILLIN CLAVULANATE 875 MG-125 MG TAB
875-125 mg | ORAL_TABLET | Freq: Two times a day (BID) | ORAL | 0 refills | Status: AC
Start: 2020-02-02 — End: 2020-02-12

## 2020-02-02 MED FILL — GABAPENTIN 100 MG CAP: 100 mg | ORAL | Qty: 2

## 2020-02-02 MED FILL — MAG-AL PLUS 200 MG-200 MG-20 MG/5 ML ORAL SUSPENSION: 200-200-20 mg/5 mL | ORAL | Qty: 30

## 2020-02-02 MED FILL — ENOXAPARIN 40 MG/0.4 ML SUB-Q SYRINGE: 40 mg/0.4 mL | SUBCUTANEOUS | Qty: 0.4

## 2020-02-02 NOTE — Discharge Summary (Signed)
Discharge Summary by Leane Call, NP at 02/02/20 1218                Author: Leane Call, NP  Service: Internal Medicine  Author Type: Nurse Practitioner       Filed: 02/02/20 1227  Date of Service: 02/02/20 1218  Status: Addendum          Editor: Leane Call, NP (Nurse Practitioner)       Related Notes: Original Note by Leane Call, NP (Nurse Practitioner) filed at 02/02/20 1226          Cosigner: Pamalee Leyden, MD at 02/02/20 1736                                           Hospitalist Discharge Summary        Patient ID:   Michael Lozano   [id number]   85 y.o.   Mar 20, 1934   01/28/2020      PCP on record: Jenkins Rouge, NP      Admit date: 01/28/2020   Discharge date and time: 02/02/2020      DISCHARGE DIAGNOSIS:      Diabetic ulcer of right midfoot associated with type II Diabetes   Type II Diabetes    Gout????   Neuropathy??   ??   CONSULTATIONS:   IP CONSULT TO HOSPITALIST   IP CONSULT TO PODIATRY      Excerpted HPI from H&P of Lynnea Ferrier, MD:   Michael Lozano is a 85 y.o. male with PMHx significant for chronic diabetic ulcer T2DM, hx of DVT, chronic pain, presents to the ER for evaluation of worsening of RLE pain.   Pt is a poor historian and no family was available to provide additional hx after multiple attempts to get in touch with family.    Pt has discoloration on RLE which he states has been chronic.  Pt informed me "they wanted to do surgery" on his R leg but was not able to provide additional info.     He denies any fever, chills, cp, sob, n/v/d.   In the ER, he was given IV rocephin.     Vitals/labs reviewed.          We were asked to admit for work up and evaluation of the above problems.       ______________________________________________________________________   DISCHARGE SUMMARY/HOSPITAL COURSE:   for full details see H&P, daily progress notes, labs, consult notes.       85 year old male with a history of diabetes, gout, neuropathy that presented to the emergency room??12/13  with complaints of right foot pain??and was admitted for management  of a diabetic wound to his right foot.   ??   #1. Diabetic ulcer of right midfoot associated with type II Diabetes   - s/p ID 12-15   -??WBC 4.6 12-17,??continues to remain??afebrile   - Wound care   - Culture results: MRSA    - Complete course of Augmentin OP    - Appreciate Podiatry input    - Appreciate CM input - patient lives in a poorly served area for [facility], last admission/discharge in August CM exhausted search for accepting [facility] agency with no luck.    - Family will assist with dressing changes and have been taught by RN   - Encourage use of surgical shoe    #2.  DM II   - A1C of 5.7   - Blood sugars remain well controlled currently with just correctional SSI   - Not on any medications OP    #3. Hx Gout- not in flare????   #4. Hx Neuropathy??      ??Patient seen at bedside. He is requesting to go home. Patient's plan of care has been reviewed with them.  Patient and/or family have  verbally conveyed their understanding and agreement of the patient's signs, symptoms, diagnosis, treatment and prognosis and additionally agree to follow up as recommended or return to [facility] should their condition change prior  to follow-up.  Discharge instructions have also been provided to the patient with some educational information regarding their diagnosis as well a list of reasons why they would want to return to the office prior to their follow-up appointment should  their condition change.   _______________________________________________________________________   Patient seen and examined by me on discharge day.   Pertinent Findings:   Gen:    Not in distress   Chest: Clear lungs   CVS:   Regular rhythm.  No edema   Abd:  Soft, not distended, not tender   Neuro:  Alert and oriented x3    _______________________________________________________________________   DISCHARGE MEDICATIONS:      Current Discharge Medication List              START  taking these medications          Details        amoxicillin-clavulanate (Augmentin) 875-125 mg per tablet  Take 1 Tablet by mouth two (2) times a day for 10 days.   Qty: 20 Tablet, Refills:  0   Start date: 02/02/2020, End date:  02/12/2020                     CONTINUE these medications which have NOT CHANGED          Details        polyethylene glycol (Miralax) 17 gram/dose powder  Take 17 g by mouth daily.   Qty: 289 g, Refills:  0               atorvastatin (LIPITOR) 40 mg tablet  Take 40 mg by mouth daily.               acetaminophen (TYLENOL) 325 mg tablet  Take 2 Tablets by mouth every four (4) hours as needed for Pain.   Qty: 20 Tablet, Refills:  0                            Patient Follow Up Instructions:    Activity: Activity as tolerated   Diet: Diabetic Diet   Wound Care: As directed           Follow-up Information               Follow up With  Specialties  Details  Why  Contact Info              Jenkins Rouge, NP  Nurse Practitioner  In 1 week    [address]   [place] [address]   [phone]                 Marla Roe, DPM  Podiatry  In 2 weeks    [address] [place] [address]  [phone]                ________________________________________________________________      Risk of deterioration: Low      Condition at Discharge:  Stable   __________________________________________________________________      Disposition   Home with family, no needs      ____________________________________________________________________      Code Status: Full Code   ___________________________________________________________________         Total time in minutes spent coordinating this discharge (includes going over instructions, follow-up, prescriptions, and preparing report for sign off to her PCP) :  >30 minutes      Signed:   Leane Call, NP

## 2020-02-02 NOTE — Progress Notes (Signed)
Patient continuing to improve, granular tissue base. No deep exposure, there was no pathology as there was no bone exposure or signs of osteomyelitis.    No pathology report to wait on, patient has completed C & S- potentially based on the culture options can be for Bactrim, or Augmentin.     He can be discharged with nursing wound care orders q.48 dressing changes, surgical shoe and the oral antibiotics.      Dr. Marvell Fuller  [phone]

## 2020-02-02 NOTE — Progress Notes (Signed)
Transition of Care Plan:    RUR:10 % , LOW  Disposition: Home with family   Follow up appointments: PCP, Podiatry   DME needed: None   Transportation at Discharge:  Keys or means to access home:      Family has access  IM Medicare Letter:2nd IM   Is patient a BCPI-A Bundle: N/A         If yes, was Bundle Letter given?:    Is patient a Veteran and connected with the VA? N/A  If yes, was [place] transfer form completed and VA notified?   Caregiver Contact: daughter Delorise Jackson [phone]  Discharge Caregiver contacted prior to discharge?            Yes    CM acknowledged discharge. Patient recommended to have home health for wound care. During patient's last admission in August CM failed to secure home health services due to patient's managed Medicare Plan as well as patient [place] a [place] area that is not covered by any agencies. CM informed attending NP who stated that she would have patient's primary nurse teach family how to perform wound care. Patient's family at the bedside to transport patient home. Patient to follow up outpatient with PCP and Podiatry. Follow up appointments cannot be made on weekends.     No further concerns indicated at this time. AVS updated. Pt is ready for discharge from a Care Management standpoint. RN informed.       Care Management Interventions  PCP Verified by CM: Yes  Mode of Transport at Discharge: Other (see comment) (Patient family to transport )  Transition of Care Consult (CM Consult): Discharge Planning  Discharge Durable Medical Equipment: No  Physical Therapy Consult: Yes  Occupational Therapy Consult: Yes  Speech Therapy Consult: No  Support Systems: Child(ren),Other Family Member(s)  Confirm Follow Up Transport: Family  The Patient and/or Patient Representative was Provided with a Choice of Provider and Agrees with the Discharge Plan?: Yes  Name of the Patient Representative Who was Provided with a Choice of Provider and Agrees with the Discharge Plan:  Patient  Freedom of Choice List was Provided with Basic Dialogue that Supports the Patient's Individualized Plan of Care/Goals, Treatment Preferences and Shares the Quality Data Associated with the Providers?: Yes  Discharge Location  Discharge Placement: Home with family assistance    Cathlyn Parsons, BSN, RN, Armed forces technical officer

## 2020-08-09 ENCOUNTER — Emergency Department: Admit: 2020-08-09 | Payer: MEDICARE | Primary: Adult Health

## 2020-08-09 ENCOUNTER — Inpatient Hospital Stay
Admit: 2020-08-09 | Discharge: 2020-08-15 | Disposition: A | Discharge status: Home Health | Payer: MEDICARE | Attending: Adolescent Medicine | Admitting: Adolescent Medicine

## 2020-08-09 DIAGNOSIS — E11621 Type 2 diabetes mellitus with foot ulcer: Secondary | ICD-10-CM

## 2020-08-09 DIAGNOSIS — L97412 Non-pressure chronic ulcer of right heel and midfoot with fat layer exposed: Secondary | ICD-10-CM
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
## 2020-08-09 NOTE — Progress Notes (Signed)
 Pharmacy Antimicrobial Kinetic Dosing    Indication for Antimicrobials: diabetic foot infection    Current Regimen of Each Antimicrobial:  Zosyn 3.375 gm IV q8h (Start Date 6/25; Day # 1)  Vancomycin 2000 mg IV once, then 750 mg IV q24h (Start Date 6/25; Day # 1)    Previous Antimicrobial Therapy:  Unasyn 3 gm IV once on 6/25    Goal Level: AUC: 400-600 mg/hr/Liter/day    Date Dose & Interval Measured (mcg/mL) Predicted AUC/MIC                       Dosing calculator used: [employer] calculator    Conditions for Dosing Consideration: None    Labs:  Recent Labs     08/09/20  2019   CREA 1.98*   BUN 24*     Recent Labs     08/09/20  2019   WBC 8.3     Temp (24hrs), Avg:98.1 F (36.7 C), Min:98.1 F (36.7 C), Max:98.1 F (36.7 C)    Creatinine Clearance (mL/min):   CrCl (Ideal Body Weight): 30.5     Impression/Plan:   Vancomycin 2000 mg loading dose, followed by 750 mg q24h (predicted AUC 437, predicted trough 14.8 mcg/ml)  Antimicrobial stop date 7 days     Pharmacy will follow daily and adjust medications as appropriate for renal function and/or serum levels.    Thank you,  Rogers JULIANNA Goldmann, PHARMD

## 2020-08-10 LAB — GLUCOSE, POC: Glucose (POC): 122 mg/dL — ABNORMAL HIGH (ref 65–117)

## 2020-08-10 LAB — METABOLIC PANEL, COMPREHENSIVE
A-G Ratio: 0.7 — ABNORMAL LOW (ref 1.1–2.2)
ALT (SGPT): 21 U/L (ref 12–78)
AST (SGOT): 34 U/L (ref 15–37)
Albumin: 3.7 g/dL (ref 3.5–5.0)
Alk. phosphatase: 98 U/L (ref 45–117)
Anion gap: 2 mmol/L — ABNORMAL LOW (ref 5–15)
BUN/Creatinine ratio: 12 (ref 12–20)
BUN: 24 MG/DL — ABNORMAL HIGH (ref 6–20)
Bilirubin, total: 0.6 MG/DL (ref 0.2–1.0)
CO2: 24 mmol/L (ref 21–32)
Calcium: 10.2 MG/DL — ABNORMAL HIGH (ref 8.5–10.1)
Chloride: 107 mmol/L (ref 97–108)
Creatinine: 1.98 MG/DL — ABNORMAL HIGH (ref 0.70–1.30)
GFR est AA: 39 mL/min/{1.73_m2} — ABNORMAL LOW (ref >60)
GFR est non-AA: 32 mL/min/{1.73_m2} — ABNORMAL LOW (ref >60)
Globulin: 5.5 g/dL — ABNORMAL HIGH (ref 2.0–4.0)
Glucose: 108 mg/dL — ABNORMAL HIGH (ref 65–100)
Potassium: 4.6 mmol/L (ref 3.5–5.1)
Protein, total: 9.2 g/dL — ABNORMAL HIGH (ref 6.4–8.2)
Sodium: 133 mmol/L — ABNORMAL LOW (ref 136–145)

## 2020-08-10 LAB — CBC WITH AUTOMATED DIFF
ABS. BASOPHILS: 0 10*3/uL (ref 0.0–0.1)
ABS. EOSINOPHILS: 0.2 10*3/uL (ref 0.0–0.4)
ABS. IMM. GRANS.: 0 10*3/uL (ref 0.00–0.04)
ABS. LYMPHOCYTES: 1.1 10*3/uL (ref 0.8–3.5)
ABS. MONOCYTES: 0.6 10*3/uL (ref 0.0–1.0)
ABS. NEUTROPHILS: 6.4 10*3/uL (ref 1.8–8.0)
ABSOLUTE NRBC: 0 10*3/uL (ref 0.00–0.01)
BASOPHILS: 0 % (ref 0–1)
EOSINOPHILS: 2 % (ref 0–7)
HCT: 38.2 % (ref 36.6–50.3)
HGB: 12.8 g/dL (ref 12.1–17.0)
IMMATURE GRANULOCYTES: 0 % (ref 0.0–0.5)
LYMPHOCYTES: 13 % (ref 12–49)
MCH: 28.3 PG (ref 26.0–34.0)
MCHC: 33.5 g/dL (ref 30.0–36.5)
MCV: 84.3 FL (ref 80.0–99.0)
MONOCYTES: 8 % (ref 5–13)
MPV: 10.1 FL (ref 8.9–12.9)
NEUTROPHILS: 77 % — ABNORMAL HIGH (ref 32–75)
NRBC: 0 PER 100 WBC
PLATELET: 357 10*3/uL (ref 150–400)
RBC: 4.53 M/uL (ref 4.10–5.70)
RDW: 14.6 % — ABNORMAL HIGH (ref 11.5–14.5)
WBC: 8.3 10*3/uL (ref 4.1–11.1)

## 2020-08-10 LAB — METABOLIC PANEL, BASIC
Anion gap: 7 mmol/L (ref 5–15)
BUN/Creatinine ratio: 13 (ref 12–20)
BUN: 22 MG/DL — ABNORMAL HIGH (ref 6–20)
CO2: 22 mmol/L (ref 21–32)
Calcium: 9.2 MG/DL (ref 8.5–10.1)
Chloride: 107 mmol/L (ref 97–108)
Creatinine: 1.65 MG/DL — ABNORMAL HIGH (ref 0.70–1.30)
GFR est AA: 48 mL/min/{1.73_m2} — ABNORMAL LOW (ref >60)
GFR est non-AA: 40 mL/min/{1.73_m2} — ABNORMAL LOW (ref >60)
Glucose: 102 mg/dL — ABNORMAL HIGH (ref 65–100)
Potassium: 4.1 mmol/L (ref 3.5–5.1)
Sodium: 136 mmol/L (ref 136–145)

## 2020-08-10 LAB — HEMOGLOBIN A1C WITH EAG
Est. average glucose: 117 mg/dL
Hemoglobin A1c: 5.7 % — ABNORMAL HIGH (ref 4.0–5.6)

## 2020-08-10 LAB — SED RATE (ESR): Sed rate, automated: 30 mm/hr — ABNORMAL HIGH (ref 0–20)

## 2020-08-10 LAB — CRP, HIGH SENSITIVITY
CRP High Sensitivity: 3.3 mg/L
CRP, High sensitivity: 3.3 mg/L

## 2020-08-10 LAB — MAGNESIUM
Magnesium: 2.4 mg/dL (ref 1.6–2.4)
Magnesium: 2.4 mg/dL (ref 1.6–2.4)

## 2020-08-10 LAB — BASIC METABOLIC PANEL
Anion Gap: 7 mmol/L (ref 5–15)
BUN: 22 MG/DL — ABNORMAL HIGH (ref 6–20)
Bun/Cre Ratio: 13 (ref 12–20)
CO2: 22 mmol/L (ref 21–32)
Calcium: 9.2 MG/DL (ref 8.5–10.1)
Chloride: 107 mmol/L (ref 97–108)
Creatinine: 1.65 MG/DL — ABNORMAL HIGH (ref 0.70–1.30)
EGFR IF NonAfrican American: 40 mL/min/{1.73_m2} — ABNORMAL LOW (ref >60)
GFR African American: 48 mL/min/{1.73_m2} — ABNORMAL LOW (ref >60)
Glucose: 102 mg/dL — ABNORMAL HIGH (ref 65–100)
Potassium: 4.1 mmol/L (ref 3.5–5.1)
Sodium: 136 mmol/L (ref 136–145)

## 2020-08-10 LAB — CBC WITH AUTO DIFFERENTIAL
Basophils %: 0 % (ref 0–1)
Basophils Absolute: 0 10*3/uL (ref 0.0–0.1)
Eosinophils %: 2 % (ref 0–7)
Eosinophils Absolute: 0.2 10*3/uL (ref 0.0–0.4)
Granulocyte Absolute Count: 0 10*3/uL (ref 0.00–0.04)
Hematocrit: 38.2 % (ref 36.6–50.3)
Hemoglobin: 12.8 g/dL (ref 12.1–17.0)
Immature Granulocytes: 0 % (ref 0.0–0.5)
Lymphocytes %: 13 % (ref 12–49)
Lymphocytes Absolute: 1.1 10*3/uL (ref 0.8–3.5)
MCH: 28.3 PG (ref 26.0–34.0)
MCHC: 33.5 g/dL (ref 30.0–36.5)
MCV: 84.3 FL (ref 80.0–99.0)
MPV: 10.1 FL (ref 8.9–12.9)
Monocytes %: 8 % (ref 5–13)
Monocytes Absolute: 0.6 10*3/uL (ref 0.0–1.0)
NRBC Absolute: 0 10*3/uL (ref 0.00–0.01)
Neutrophils %: 77 % — ABNORMAL HIGH (ref 32–75)
Neutrophils Absolute: 6.4 10*3/uL (ref 1.8–8.0)
Nucleated RBCs: 0 PER 100 WBC
Platelets: 357 10*3/uL (ref 150–400)
RBC: 4.53 M/uL (ref 4.10–5.70)
RDW: 14.6 % — ABNORMAL HIGH (ref 11.5–14.5)
WBC: 8.3 10*3/uL (ref 4.1–11.1)

## 2020-08-10 LAB — COMPREHENSIVE METABOLIC PANEL
ALT: 21 U/L (ref 12–78)
AST: 34 U/L (ref 15–37)
Albumin/Globulin Ratio: 0.7 — ABNORMAL LOW (ref 1.1–2.2)
Albumin: 3.7 g/dL (ref 3.5–5.0)
Alkaline Phosphatase: 98 U/L (ref 45–117)
Anion Gap: 2 mmol/L — ABNORMAL LOW (ref 5–15)
BUN: 24 MG/DL — ABNORMAL HIGH (ref 6–20)
Bun/Cre Ratio: 12 (ref 12–20)
CO2: 24 mmol/L (ref 21–32)
Calcium: 10.2 MG/DL — ABNORMAL HIGH (ref 8.5–10.1)
Chloride: 107 mmol/L (ref 97–108)
Creatinine: 1.98 MG/DL — ABNORMAL HIGH (ref 0.70–1.30)
EGFR IF NonAfrican American: 32 mL/min/{1.73_m2} — ABNORMAL LOW (ref >60)
GFR African American: 39 mL/min/{1.73_m2} — ABNORMAL LOW (ref >60)
Globulin: 5.5 g/dL — ABNORMAL HIGH (ref 2.0–4.0)
Glucose: 108 mg/dL — ABNORMAL HIGH (ref 65–100)
Potassium: 4.6 mmol/L (ref 3.5–5.1)
Sodium: 133 mmol/L — ABNORMAL LOW (ref 136–145)
Total Bilirubin: 0.6 MG/DL (ref 0.2–1.0)
Total Protein: 9.2 g/dL — ABNORMAL HIGH (ref 6.4–8.2)

## 2020-08-10 LAB — HEMOGLOBIN A1C W/EAG
Hemoglobin A1C: 5.7 % — ABNORMAL HIGH (ref 4.0–5.6)
eAG: 117 mg/dL

## 2020-08-10 LAB — SEDIMENTATION RATE: Sed Rate: 30 mm/hr — ABNORMAL HIGH (ref 0–20)

## 2020-08-10 LAB — POCT GLUCOSE: POC Glucose: 122 mg/dL — ABNORMAL HIGH (ref 65–117)

## 2020-08-10 MED ORDER — SODIUM CHLORIDE 0.9 % IJ SYRG
Freq: Three times a day (TID) | INTRAMUSCULAR | Status: DC
Start: 2020-08-10 — End: 2020-08-15
  Administered 2020-08-10 – 2020-08-15 (×17): via INTRAVENOUS

## 2020-08-10 MED ORDER — SODIUM CHLORIDE 0.9 % IV
750 mg | INTRAVENOUS | Status: DC
Start: 2020-08-10 — End: 2020-08-14
  Administered 2020-08-11 – 2020-08-14 (×4): via INTRAVENOUS

## 2020-08-10 MED ORDER — ACETAMINOPHEN 650 MG RECTAL SUPPOSITORY
650 mg | Freq: Four times a day (QID) | RECTAL | Status: DC | PRN
Start: 2020-08-10 — End: 2020-08-15

## 2020-08-10 MED ORDER — ACETAMINOPHEN 325 MG TABLET
325 mg | Freq: Four times a day (QID) | ORAL | Status: DC | PRN
Start: 2020-08-10 — End: 2020-08-15
  Administered 2020-08-10 – 2020-08-15 (×5): via ORAL

## 2020-08-10 MED ORDER — VIAL-MATE ADAPTOR
1000 mg | Freq: Two times a day (BID) | Status: DC
Start: 2020-08-10 — End: 2020-08-09

## 2020-08-10 MED ORDER — VANCOMYCIN IN 0.9 % SODIUM CHLORIDE 2 GRAM/500 ML IV
2 gram/500 mL | Freq: Once | INTRAVENOUS | Status: AC
Start: 2020-08-10 — End: 2020-08-09
  Administered 2020-08-10: 02:00:00 via INTRAVENOUS

## 2020-08-10 MED ORDER — PIPERACILLIN-TAZOBACTAM 3.375 GRAM IV SOLR
3.375 gram | Freq: Three times a day (TID) | INTRAVENOUS | Status: DC
Start: 2020-08-10 — End: 2020-08-15
  Administered 2020-08-10 – 2020-08-15 (×17): via INTRAVENOUS

## 2020-08-10 MED ORDER — SODIUM CHLORIDE 0.9 % IJ SYRG
INTRAMUSCULAR | Status: DC | PRN
Start: 2020-08-10 — End: 2020-08-15
  Administered 2020-08-12: 21:00:00 via INTRAVENOUS

## 2020-08-10 MED ORDER — DEXTROSE 10% IN WATER (D10W) IV
10 % | INTRAVENOUS | Status: DC | PRN
Start: 2020-08-10 — End: 2020-08-15

## 2020-08-10 MED ORDER — SODIUM CHLORIDE 0.9 % IV PIGGY BACK
3 gram | INTRAVENOUS | Status: AC
Start: 2020-08-10 — End: 2020-08-09
  Administered 2020-08-10: 02:00:00 via INTRAVENOUS

## 2020-08-10 MED ORDER — GLUCOSE 4 GRAM CHEWABLE TAB
4 gram | ORAL | Status: DC | PRN
Start: 2020-08-10 — End: 2020-08-15

## 2020-08-10 MED ORDER — TRAMADOL 50 MG TAB
50 mg | Freq: Four times a day (QID) | ORAL | Status: DC | PRN
Start: 2020-08-10 — End: 2020-08-15
  Administered 2020-08-11 – 2020-08-14 (×5): via ORAL

## 2020-08-10 MED ORDER — TRAMADOL 50 MG TAB
50 mg | Freq: Once | ORAL | Status: AC
Start: 2020-08-10 — End: 2020-08-10
  Administered 2020-08-10: 11:00:00 via ORAL

## 2020-08-10 MED ORDER — ATORVASTATIN 40 MG TAB
40 mg | Freq: Every day | ORAL | Status: DC
Start: 2020-08-10 — End: 2020-08-15
  Administered 2020-08-10 – 2020-08-15 (×6): via ORAL

## 2020-08-10 MED ORDER — ACETAMINOPHEN 325 MG TABLET
325 mg | Freq: Four times a day (QID) | ORAL | Status: DC | PRN
Start: 2020-08-10 — End: 2020-08-09

## 2020-08-10 MED ORDER — ASPIRIN 81 MG TAB, DELAYED RELEASE
81 mg | Freq: Every day | ORAL | Status: DC
Start: 2020-08-10 — End: 2020-08-15
  Administered 2020-08-10 – 2020-08-15 (×6): via ORAL

## 2020-08-10 MED ORDER — SODIUM CHLORIDE 0.9 % IV
INTRAVENOUS | Status: DC
Start: 2020-08-10 — End: 2020-08-14
  Administered 2020-08-10 – 2020-08-14 (×7): via INTRAVENOUS

## 2020-08-10 MED ORDER — PANTOPRAZOLE 40 MG TAB, DELAYED RELEASE
40 mg | Freq: Every day | ORAL | Status: DC
Start: 2020-08-10 — End: 2020-08-15
  Administered 2020-08-10 – 2020-08-15 (×6): via ORAL

## 2020-08-10 MED ORDER — POLYETHYLENE GLYCOL 3350 17 GRAM (100 %) ORAL POWDER PACKET
17 gram | Freq: Every day | ORAL | Status: DC | PRN
Start: 2020-08-10 — End: 2020-08-15

## 2020-08-10 MED ORDER — ENOXAPARIN 40 MG/0.4 ML SUB-Q SYRINGE
40 mg/0.4 mL | Freq: Every day | SUBCUTANEOUS | Status: DC
Start: 2020-08-10 — End: 2020-08-15
  Administered 2020-08-10 – 2020-08-15 (×3): via SUBCUTANEOUS

## 2020-08-10 MED ORDER — ONDANSETRON (PF) 4 MG/2 ML INJECTION
4 mg/2 mL | Freq: Four times a day (QID) | INTRAMUSCULAR | Status: DC | PRN
Start: 2020-08-10 — End: 2020-08-15

## 2020-08-10 MED ORDER — ONDANSETRON 4 MG TAB, RAPID DISSOLVE
4 mg | Freq: Three times a day (TID) | ORAL | Status: DC | PRN
Start: 2020-08-10 — End: 2020-08-15

## 2020-08-10 MED ORDER — GABAPENTIN 100 MG CAP
100 mg | Freq: Two times a day (BID) | ORAL | Status: DC
Start: 2020-08-10 — End: 2020-08-15
  Administered 2020-08-10 – 2020-08-15 (×11): via ORAL

## 2020-08-10 MED ORDER — GLUCAGON 1 MG INJECTION
1 mg | INTRAMUSCULAR | Status: DC | PRN
Start: 2020-08-10 — End: 2020-08-15

## 2020-08-10 MED ORDER — INSULIN LISPRO 100 UNIT/ML INJECTION
100 unit/mL | Freq: Four times a day (QID) | SUBCUTANEOUS | Status: DC
Start: 2020-08-10 — End: 2020-08-15
  Administered 2020-08-12 – 2020-08-14 (×2): via SUBCUTANEOUS

## 2020-08-10 MED ORDER — LISINOPRIL 5 MG TAB
5 mg | Freq: Every day | ORAL | Status: DC
Start: 2020-08-10 — End: 2020-08-15
  Administered 2020-08-10: 15:00:00 via ORAL

## 2020-08-10 MED FILL — GABAPENTIN 100 MG CAP: 100 mg | ORAL | Qty: 1

## 2020-08-10 MED FILL — AMPICILLIN-SULBACTAM 3 GRAM SOLUTION FOR INJECTION: 3 gram | INTRAMUSCULAR | Qty: 3

## 2020-08-10 MED FILL — ZOSYN 3.375 GRAM INTRAVENOUS SOLUTION: 3.375 gram | INTRAVENOUS | Qty: 3.38

## 2020-08-10 MED FILL — PANTOPRAZOLE 40 MG TAB, DELAYED RELEASE: 40 mg | ORAL | Qty: 1

## 2020-08-10 MED FILL — ENOXAPARIN 40 MG/0.4 ML SUB-Q SYRINGE: 40 mg/0.4 mL | SUBCUTANEOUS | Qty: 0.4

## 2020-08-10 MED FILL — ATORVASTATIN 40 MG TAB: 40 mg | ORAL | Qty: 1

## 2020-08-10 MED FILL — INSULIN LISPRO 100 UNIT/ML INJECTION: 100 unit/mL | SUBCUTANEOUS | Qty: 1

## 2020-08-10 MED FILL — ASPIRIN 81 MG TAB, DELAYED RELEASE: 81 mg | ORAL | Qty: 1

## 2020-08-10 MED FILL — SODIUM CHLORIDE 0.9 % IV: INTRAVENOUS | Qty: 1000

## 2020-08-10 MED FILL — TRAMADOL 50 MG TAB: 50 mg | ORAL | Qty: 1

## 2020-08-10 MED FILL — MAPAP (ACETAMINOPHEN) 325 MG TABLET: 325 mg | ORAL | Qty: 2

## 2020-08-10 MED FILL — LISINOPRIL 5 MG TAB: 5 mg | ORAL | Qty: 1

## 2020-08-10 MED FILL — BD POSIFLUSH NORMAL SALINE 0.9 % INJECTION SYRINGE: INTRAMUSCULAR | Qty: 40

## 2020-08-10 MED FILL — DEXTROSE 10% IN WATER (D10W) IV: 10 % | INTRAVENOUS | Qty: 250

## 2020-08-10 MED FILL — VANCOMYCIN IN 0.9 % SODIUM CHLORIDE 2 GRAM/500 ML IV: 2 gram/500 mL | INTRAVENOUS | Qty: 500

## 2020-08-10 NOTE — Progress Notes (Signed)
 42 - RN messaged MD. The patient is complaining of pain at the moment in his feet and he would like something that is stronger than Tylenol ordered. He was given tramadol last night. Also, I just bladder scanned the patient and it showed 406. Is there anything you would like for me to do about this? Thank you.    29 - RN messaged MD. Just wanted to let you know that the patient refused to get straight cathed by me and the charge RN. Thank you.    End of Shift Note    Bedside shift change report given to Tiffany, Charity fundraiser (oncoming nurse) by Guido Lunger, RN (offgoing nurse).  Report included the following information SBAR, Kardex, Intake/Output and MAR    Shift worked:  7am-7pm     Shift summary and any significant changes:     Pt ambulated to the chair once during the shift and tolerated the activity well. Pt is tolerating the current diet. Pt complained of pain during the shift and tylenol was given. This seemed to alleviate the pain. RN obtained an order for a stronger pain medication due to pt complaining of pain earlier and tylenol not being available for the pt. Pt did not complain of nausea during the shift. Pt had not voided during the shift, so RN bladder scanned the pt. This showed that the pt had 406 mL in them. RN immediately messaged MD and a straight cath order was obtained; however, the pt refused to allow RN to straight cath. RN educated the pt and brought in Consulting civil engineer; however, the pt still refused this. MD was messaged and is aware. Pt refused to allow PCT and RN to obtain dinner BGM. RN educated pt regarding this and pt still refused.       Concerns for physician to address:       Zone phone for oncoming shift:   [phone]       Activity:  Activity Level: Up with Assistance  Number times ambulated in hallways past shift: 0  Number of times OOB to chair past shift: 1    Cardiac:   Cardiac Monitoring: No           Access:   Current line(s): PIV     Genitourinary:   Urinary status: due to  void    Respiratory:   O2 Device: None (Room air)  Chronic home O2 use?: NO  Incentive spirometer at bedside: N/A       GI:  Last Bowel Movement Date: 08/09/20  Current diet:  ADULT DIET Regular; 3 carb choices (45 gm/meal)  Passing flatus: YES  Tolerating current diet: YES       Pain Management:   Patient states pain is manageable on current regimen: YES    Skin:  Braden Score: 19  Interventions: float heels, increase time out of bed and PT/OT consult    Patient Safety:  Fall Score: Total Score: 4  Interventions: bed/chair alarm, assistive device (walker, cane, etc), gripper socks and pt to call before getting OOB  High Fall Risk: Yes    Length of Stay:  Expected LOS: - - -  Actual LOS: 1      Guido Lunger, RN

## 2020-08-10 NOTE — Progress Notes (Signed)
End of Shift Note    Bedside shift change report given to Joanne Gavel, RN (oncoming nurse) by Danella Maiers, LPN (offgoing nurse).  Report included the following information     Shift worked:  7p-7a     Shift summary and any significant changes:     Pt welcomed to unit from ED. Pt does not want to use bed side urinal and insist on getting up to walk to the bathroom with his cane but does not call for assistance. Bed alarm is set. Complaints of burning pain under his feet, tylenol and toradol given   Concerns for physician to address:       Zone phone for oncoming shift:   [phone]       Activity:  Activity Level: Up with Assistance  Number times ambulated in hallways past shift: 0  Number of times OOB to chair past shift: 0    Cardiac:   Cardiac Monitoring: NO          Access:   Current line(s): PIV    Genitourinary:   Urinary status: Voiding    Respiratory:   O2 Device: None (Room air)  Chronic home O2 use?: NO  Incentive spirometer at bedside: NO       GI:  Last Bowel Movement Date: 08/09/20  Current diet:  ADULT DIET Regular; 3 carb choices (45 gm/meal)  Passing flatus: YES  Tolerating current diet: YES       Pain Management:   Patient states pain is manageable on current regimen:     Skin:  Braden Score: 19  Interventions:     Patient Safety:  Fall Score: Total Score: 3  Interventions:   High Fall Risk: Yes    Length of Stay:  Expected LOS:   Actual LOS: 1      Chelsie Ednie, LPN

## 2020-08-10 NOTE — Progress Notes (Signed)
 Occupational Therapy    Acknowledge OT orders and complete chart review.  Patient awaiting MRI of R foot to assess for OM of plantar foot wound. Will defer OT and await results and weight bearing status recommendations. Thank you.    Edsel JULIANNA Puna, OT

## 2020-08-10 NOTE — Progress Notes (Signed)
 Transition of Care Plan:    RUR:  13%   Disposition:  Home with granddaughter vs HH  Follow up appointments:PCP, Specialists  DME needed:  Pt owns a cane.  Transportation at [employer] or means to access home:    yes    IM Medicare Letter: needed at d/c  Is patient a BCPI-A Bundle:        n/a   If yes, was Bundle Letter given?:    Is patient a Veteran and connected with the VA?              n/a  If yes, was [employer] transfer form completed and VA notified?   Caregiver Contact:  Ozell Bard  [phone]  Discharge Caregiver contacted prior to discharge?   Care Conference needed?:             Reason for Admission:  Diabetic Foot Ulcer; Diabetic Foot Infection; AKI; CKD; DM                     RUR Score:    13%                 Plan for utilizing home health:   As needed       PCP: First and Last name:  Tommas Michael BRAVO, NP     Name of Practice:    Are you a current patient: Yes/No: no   Approximate date of last visit: it has been a long time   Can you participate in a virtual visit with your PCP:                     Current Advanced Directive/Advance Care Plan: Full Code    Advance Care Planning     General Advance Care Planning (ACP) Conversation      Date of Conversation:08/10/20  Conducted with: Patient with Decision Making Capacity    Healthcare Decision Maker:     Primary Decision Maker: Michael Lozano - Daughter - [phone]  Click here to complete HealthCare Decision Makers including selection of the Healthcare Decision Maker Relationship (ie Primary)        Content/Action Overview:   DECLINED ACP conversation - will revisit periodically   Reviewed DNR/DNI and patient        Length of Voluntary ACP Conversation in minutes:  <16 minutes (Non-Billable)    Michael Lozano                          Primary Decision Maker: Michael Lozano - Daughter - [phone]                  Transition of Care Plan:         Home with granddaughter vs HH    CM met with pt at bedside to introduce self/role,  verify demographics, insurance and PCP.  CM also discussed d/c plan.      Pt is a 86 yo single male who was admitted to [facility] on 08/09/20 with the above dxs.  Pt does not see his PCP regularly.  Pt uses the [facility].  Pt lives with his granddaughter in a 2 fl home with 2 STE.  Pt can complete his ADL care independently pta.  Pt does drive.  Pt uses a cane.  Pt stated he has a hx of HH and SNF but was unable to recall providers.  Pt stated his granddaughter can transport at d/c.  CM will continue to assess for d/c needs.    Care Management Interventions  PCP Verified by CM: Yes (Pt sees Michael Mink, NP.)  Mode of Transport at Discharge: Other (see comment) (Granddaughter)  Transition of Care Consult (CM Consult): Discharge Planning  MyChart Signup:  (Pt has a cane.)  Discharge Durable Medical Equipment: No  Physical Therapy Consult: Yes  Occupational Therapy Consult: Yes  Speech Therapy Consult: No  Support Systems: Other Family Member(s)  Confirm Follow Up Transport: Self  The Patient and/or Patient Representative was Provided with a Choice of Provider and Agrees with the Discharge Plan?: Yes  Freedom of Choice List was Provided with Basic Dialogue that Supports the Patient's Individualized Plan of Care/Goals, Treatment Preferences and Shares the Quality Data Associated with the Providers?: Yes  Discharge Location  Patient Expects to be Discharged to:: Home with family assistance    Michael Lozano, MSW  Care Management, [facility]   [phone]

## 2020-08-10 NOTE — Progress Notes (Signed)
Progress  Notes by Vena Austria, MD at 08/10/20 1726                Author: Vena Austria, MD  Service: Internal Medicine  Author Type: Physician       Filed: 08/10/20 1734  Date of Service: 08/10/20 1726  Status: Signed          Editor: Vena Austria, MD (Physician)                       Hospitalist Progress Note      NAME: Michael Lozano    DOB:  November 28, 1934    MRN:  [id number]          Assessment / Plan:     Rt foot non healing Diabetic ulcer    Peripheral neuropathy   Duplex US neg, xr R foot neg     Pending MRI of foot   Cont vancomycin and zosyn. Podiatry consult placed   IV vanco/zosyn  For now      CKD3  With Acute kidney injury  Cr today 1.9   Cr  Is 1.5 at baseline. Check bmp in am    ??   ??   Hx of DVT not on AC, US doppler negative for DVT   Hx of GERD   HLD??   HTN   Hx Gout   Hx Neuropathy??   -Cont home asa, Lipitor, lisinopril, PPI   -Cont home gabapentin    ??   Consulting pharmacy for med rec.   ??   ??   Code Status:????Full   Surrogate Decision Maker:   DVT Prophylaxis:??lovenox SQ   GI Prophylaxis: not indicated   Baseline:          Body mass index is 24.41 kg/m??.                Subjective:        Chief Complaint / Reason for Physician Visit   Reports rt foot pain that is poorly controlled.   No fever         Review of Systems:           Symptom  Y/N  Comments    Symptom  Y/N  Comments             Fever/Chills        Chest Pain                 Poor Appetite        Edema         Cough        Abdominal Pain                 Sputum        Joint Pain         SOB/DOE        Pruritis/Rash         Nausea/vomit        Tolerating PT/OT         Diarrhea        Tolerating Diet         Constipation        Other               Could NOT obtain due to:            Objective:        VITALS:    Last 24hrs VS reviewed since prior progress  note. Most recent are:   Patient Vitals for the past 24 hrs:            Temp  Pulse  Resp  BP  SpO2            08/10/20 1434  (P) 98 ??F (36.7 ??C)  --  (P) 17  (!) (P) 85/50  (P) 98  %            08/10/20 1030  97.8 ??F (36.6 ??C)  67  17  (!) 112/57  99 %     08/10/20 0358  98.5 ??F (36.9 ??C)  80  18  137/80  100 %     08/09/20 2329  98.5 ??F (36.9 ??C)  80  18  (!) 147/72  94 %     08/09/20 2249  --  70  16  (!) 131/58  98 %     08/09/20 2151  --  70  16  124/66  100 %     08/09/20 2028  --  60  16  122/63  97 %     08/09/20 2023  --  --  --  --  97 %            08/09/20 1836  98.1 ??F (36.7 ??C)  83  16  104/84  98 %        No intake or output data in the 24 hours ending 08/10/20 1727       PHYSICAL EXAM:   General: Alert, cooperative, no acute distress????   EENT:  EOMI. Anicteric sclerae. MMM   Resp:  CTA bilaterally, no wheezing or rales.  No accessory muscle use   CV:  Regular  rhythm,?? No edema   GI:  Soft, Non distended, Non tender. ??+Bowel sounds   Neurologic:?? Alert and oriented X 3, normal speech,    Psych:???? Good insight.??Not anxious nor agitated   Skin:  Rt foot dressing +       Reviewed most current lab test results and cultures  YES   Reviewed most current radiology test results   YES   Review and summation of old records today    NO   Reviewed patient's current orders and MAR    YES   PMH/SH reviewed - no change compared  to H&P               Current Facility-Administered Medications:    ?  insulin lispro (HUMALOG) injection, , SubCUTAneous, AC&HS, Kennadi Albany K, MD   ?  glucose chewable tablet 16 g, 4 Tablet, Oral, PRN, Darlina Rumpf, Carlynn Herald, MD   ?  glucagon (GLUCAGEN) injection 1 mg, 1 mg, IntraMUSCular, PRN, Kareem Aul K, MD   ?  dextrose 10% infusion 0-250 mL, 0-250 mL, IntraVENous, PRN, Darlina Rumpf, Carlynn Herald, MD   ?  aspirin delayed-release tablet 81 mg, 81 mg, Oral, DAILY, Din, Nizam U, MD, 81 mg at 08/10/20 1038   ?  atorvastatin (LIPITOR) tablet 40 mg, 40 mg, Oral, DAILY, Din, Nizam U, MD, 40 mg at 08/10/20 1038   ?  lisinopriL (PRINIVIL, ZESTRIL) tablet 2.5 mg, 2.5 mg, Oral, DAILY, Din, Nizam U, MD, 2.5 mg at 08/10/20 1038   ?  sodium chloride (NS) flush 5-40 mL, 5-40 mL, IntraVENous,  Q8H, Din, Nizam U, MD, 10 mL at 08/10/20 1504   ?  sodium chloride (NS) flush 5-40 mL, 5-40 mL, IntraVENous, PRN, Din, Bufford Spikes, MD   ?  acetaminophen (TYLENOL) tablet 650  mg, 650 mg, Oral, Q6H PRN, 650 mg at 08/10/20 0518 **OR** acetaminophen (TYLENOL) suppository 650 mg, 650 mg, Rectal, Q6H PRN, Din, Nizam U, MD   ?  polyethylene glycol (MIRALAX) packet 17 g, 17 g, Oral, DAILY PRN, Din, Nizam U, MD   ?  ondansetron (ZOFRAN ODT) tablet 4 mg, 4 mg, Oral, Q8H PRN **OR** ondansetron (ZOFRAN) injection 4 mg, 4 mg, IntraVENous, Q6H PRN, Din, Nizam U, MD   ?  enoxaparin (LOVENOX) injection 40 mg, 40 mg, SubCUTAneous, DAILY, Din, Nizam U, MD, 40 mg at 08/10/20 1038   ?  piperacillin-tazobactam (ZOSYN) 3.375 g in 0.9% sodium chloride (MBP/ADV) 100 mL MBP, 3.375 g, IntraVENous, Q8H, Din, Nizam U, MD, Last Rate: 25 mL/hr at 08/10/20 1037, 3.375 g at 08/10/20 1037   ?  0.9% sodium chloride infusion, 75 mL/hr, IntraVENous, CONTINUOUS, Din, Nizam U, MD, Last Rate: 75 mL/hr at 08/10/20 0153, 75 mL/hr at 08/10/20 0153   ?  pantoprazole (PROTONIX) tablet 40 mg, 40 mg, Oral, ACB, Din, Bufford SpikesNizam U, MD, 40 mg at 08/10/20 [id number]   ?  gabapentin (NEURONTIN) capsule 100 mg, 100 mg, Oral, BID, Din, Nizam U, MD, 100 mg at 08/10/20 1038   ?  vancomycin (VANCOCIN) 750 mg in 0.9% sodium chloride 250 mL (VIAL-MATE), 750 mg, IntraVENous, Q24H, Din, Bufford SpikesNizam U, MD   ________________________________________________________________________   Care Plan discussed with:           Comments         Patient  y           Family              RN  y       Water quality scientistCare Manager             Consultant                              Multidiciplinary team rounds were held today with case manager, nursing, pharmacist and Higher education careers adviserclinical coordinator.  Patient's plan of care was discussed;  medications were reviewed and discharge planning was addressed.       ________________________________________________________________________   Total NON critical care TIME:  35    Minutes      Total  CRITICAL CARE TIME Spent:   Minutes non procedure based              Comments         >50% of visit spent in counseling and coordination of care         ________________________________________________________________________   Vena AustriaVarun K Merril Isakson, MD       Procedures: see electronic medical records for all procedures/Xrays and details which were not copied into this note but were reviewed prior to creation of Plan.        LABS:   I reviewed today's most current labs and imaging studies.   Pertinent labs include:     Recent Labs           08/09/20   2019     WBC  8.3     HGB  12.8     HCT  38.2        PLT  357          Recent Labs            08/10/20   0444  08/09/20   2019     NA  136  133*  K  4.1  4.6     CL  107  107     CO2  22  24     GLU  102*  108*     BUN  22*  24*     CREA  1.65*  1.98*     CA  9.2  10.2*     MG  2.4   --      ALB   --   3.7     TBILI   --   0.6         ALT   --   21           Signed: Vena Austria, MD

## 2020-08-10 NOTE — Progress Notes (Signed)
-  Please complete MRI History and Safety Screening Form   - Patient cannot be scanned until this form is completed, including signatures, and reviewed in MRI to ensure patient is SAFE and eligible for MRI.  - CALL MRI when this has been successfully completed at [phone].

## 2020-08-10 NOTE — Progress Notes (Signed)
Received notification from bedside RN about patient with regards to: bilateral foot discomfort described as aching and burning not relieved by Tylenol, requesting medication for relief  VS: BP 137/80, HR 80, RR 18, o2 sat 100% on RA    Intervention given: Tramadol 50 mg PO x 1 dose ordered

## 2020-08-10 NOTE — Progress Notes (Signed)
Physical therapy:    Orders received and chart reviewed. Pt with pending MRI of R foot to assess for OM of plantar foot wound. Will defer therapy and await results and would appreciate weight bearing status recommendations. Thank you.    Hamilton Capri, PT, DPT

## 2020-08-11 ENCOUNTER — Inpatient Hospital Stay: Admit: 2020-08-11 | Payer: MEDICARE | Primary: Adult Health

## 2020-08-11 LAB — CBC W/O DIFF
ABSOLUTE NRBC: 0 10*3/uL (ref 0.00–0.01)
HCT: 31.1 % — ABNORMAL LOW (ref 36.6–50.3)
HGB: 10.6 g/dL — ABNORMAL LOW (ref 12.1–17.0)
MCH: 28.6 PG (ref 26.0–34.0)
MCHC: 34.1 g/dL (ref 30.0–36.5)
MCV: 83.8 FL (ref 80.0–99.0)
MPV: 10.3 FL (ref 8.9–12.9)
NRBC: 0 PER 100 WBC
PLATELET: 224 10*3/uL (ref 150–400)
RBC: 3.71 M/uL — ABNORMAL LOW (ref 4.10–5.70)
RDW: 14.6 % — ABNORMAL HIGH (ref 11.5–14.5)
WBC: 4.6 10*3/uL (ref 4.1–11.1)

## 2020-08-11 LAB — METABOLIC PANEL, BASIC
Anion gap: 6 mmol/L (ref 5–15)
BUN/Creatinine ratio: 12 (ref 12–20)
BUN: 21 MG/DL — ABNORMAL HIGH (ref 6–20)
CO2: 22 mmol/L (ref 21–32)
Calcium: 8.7 MG/DL (ref 8.5–10.1)
Chloride: 111 mmol/L — ABNORMAL HIGH (ref 97–108)
Creatinine: 1.77 MG/DL — ABNORMAL HIGH (ref 0.70–1.30)
GFR est AA: 45 mL/min/{1.73_m2} — ABNORMAL LOW (ref >60)
GFR est non-AA: 37 mL/min/{1.73_m2} — ABNORMAL LOW (ref >60)
Glucose: 82 mg/dL (ref 65–100)
Potassium: 4 mmol/L (ref 3.5–5.1)
Sodium: 139 mmol/L (ref 136–145)

## 2020-08-11 LAB — GLUCOSE, POC
Glucose (POC): 113 mg/dL (ref 65–117)
Glucose (POC): 92 mg/dL (ref 65–117)
Glucose (POC): 94 mg/dL (ref 65–117)

## 2020-08-11 LAB — SAMPLES BEING HELD

## 2020-08-11 LAB — BASIC METABOLIC PANEL
Anion Gap: 6 mmol/L (ref 5–15)
BUN: 21 MG/DL — ABNORMAL HIGH (ref 6–20)
Bun/Cre Ratio: 12 (ref 12–20)
CO2: 22 mmol/L (ref 21–32)
Calcium: 8.7 MG/DL (ref 8.5–10.1)
Chloride: 111 mmol/L — ABNORMAL HIGH (ref 97–108)
Creatinine: 1.77 MG/DL — ABNORMAL HIGH (ref 0.70–1.30)
EGFR IF NonAfrican American: 37 mL/min/{1.73_m2} — ABNORMAL LOW (ref >60)
GFR African American: 45 mL/min/{1.73_m2} — ABNORMAL LOW (ref >60)
Glucose: 82 mg/dL (ref 65–100)
Potassium: 4 mmol/L (ref 3.5–5.1)
Sodium: 139 mmol/L (ref 136–145)

## 2020-08-11 LAB — CBC
Hematocrit: 31.1 % — ABNORMAL LOW (ref 36.6–50.3)
Hemoglobin: 10.6 g/dL — ABNORMAL LOW (ref 12.1–17.0)
MCH: 28.6 PG (ref 26.0–34.0)
MCHC: 34.1 g/dL (ref 30.0–36.5)
MCV: 83.8 FL (ref 80.0–99.0)
MPV: 10.3 FL (ref 8.9–12.9)
NRBC Absolute: 0 10*3/uL (ref 0.00–0.01)
Nucleated RBCs: 0 PER 100 WBC
Platelets: 224 10*3/uL (ref 150–400)
RBC: 3.71 M/uL — ABNORMAL LOW (ref 4.10–5.70)
RDW: 14.6 % — ABNORMAL HIGH (ref 11.5–14.5)
WBC: 4.6 10*3/uL (ref 4.1–11.1)

## 2020-08-11 LAB — POCT GLUCOSE
POC Glucose: 113 mg/dL (ref 65–117)
POC Glucose: 92 mg/dL (ref 65–117)
POC Glucose: 94 mg/dL (ref 65–117)

## 2020-08-11 MED ORDER — PHARMACY INFORMATION NOTE
Freq: Once | Status: AC
Start: 2020-08-11 — End: 2020-08-12

## 2020-08-11 MED ORDER — COLCHICINE 0.6 MG TAB
0.6 mg | Freq: Once | ORAL | Status: AC
Start: 2020-08-11 — End: 2020-08-11
  Administered 2020-08-11: 17:00:00 via ORAL

## 2020-08-11 MED ORDER — COLCHICINE 0.6 MG TAB
0.6 mg | Freq: Every day | ORAL | Status: DC
Start: 2020-08-11 — End: 2020-08-15
  Administered 2020-08-12 – 2020-08-15 (×4): via ORAL

## 2020-08-11 MED ORDER — ALLOPURINOL 100 MG TAB
100 mg | Freq: Every day | ORAL | Status: DC
Start: 2020-08-11 — End: 2020-08-15
  Administered 2020-08-11 – 2020-08-15 (×5): via ORAL

## 2020-08-11 MED FILL — PANTOPRAZOLE 40 MG TAB, DELAYED RELEASE: 40 mg | ORAL | Qty: 1

## 2020-08-11 MED FILL — COLCHICINE 0.6 MG TAB: 0.6 mg | ORAL | Qty: 1

## 2020-08-11 MED FILL — ZOSYN 3.375 GRAM INTRAVENOUS SOLUTION: 3.375 gram | INTRAVENOUS | Qty: 3.38

## 2020-08-11 MED FILL — LISINOPRIL 5 MG TAB: 5 mg | ORAL | Qty: 1

## 2020-08-11 MED FILL — VANCOMYCIN 750 MG IV SOLUTION: 750 mg | INTRAVENOUS | Qty: 750

## 2020-08-11 MED FILL — ATORVASTATIN 40 MG TAB: 40 mg | ORAL | Qty: 1

## 2020-08-11 MED FILL — ALLOPURINOL 100 MG TAB: 100 mg | ORAL | Qty: 1

## 2020-08-11 MED FILL — GABAPENTIN 100 MG CAP: 100 mg | ORAL | Qty: 1

## 2020-08-11 MED FILL — SODIUM CHLORIDE 0.9 % IV PIGGY BACK: INTRAVENOUS | Qty: 100

## 2020-08-11 MED FILL — ASPIRIN 81 MG TAB, DELAYED RELEASE: 81 mg | ORAL | Qty: 1

## 2020-08-11 MED FILL — PHARMACY INFORMATION NOTE: Qty: 1

## 2020-08-11 MED FILL — TRAMADOL 50 MG TAB: 50 mg | ORAL | Qty: 1

## 2020-08-11 MED FILL — COLCHICINE 0.6 MG TAB: 0.6 mg | ORAL | Qty: 2
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
## 2020-08-11 NOTE — Progress Notes (Signed)
Problem: Risk for Spread of Infection  Goal: Prevent transmission of infectious organism to others  Description: Prevent the transmission of infectious organisms to other patients, staff members, and visitors.  Outcome: Progressing Towards Goal     Problem: Falls - Risk of  Goal: *Absence of Falls  Description: Document Michael Lozano Fall Risk and appropriate interventions in the flowsheet.  Outcome: Not Progressing Towards Goal  Note: Fall Risk Interventions:  Mobility Interventions: Communicate number of staff needed for ambulation/transfer,Patient to call before getting OOB,PT Consult for mobility concerns,Strengthening exercises (ROM-active/passive),PT Consult for assist device competence,Utilize walker, cane, or other assistive device    Mentation Interventions: Adequate sleep, hydration, pain control,Bed/chair exit alarm,Door open when patient unattended,Evaluate medications/consider consulting pharmacy,Eyeglasses and hearing aids,Update white board,Toileting rounds,Room close to nurse's station,Reorient patient,More frequent rounding,Increase mobility    Medication Interventions: Teach patient to arise slowly,Patient to call before getting OOB,Evaluate medications/consider consulting pharmacy    Elimination Interventions: Call light in reach,Patient to call for help with toileting needs,Toileting schedule/hourly rounds

## 2020-08-11 NOTE — Consults (Signed)
Pt admitted to medicine with chronic infected diabetic foot wound.  Has DM and significant history of gout.  Has ckd as well.  Has been admitted and on antibx for couple days,  Developed knee pain and swelling.   Wbc 5  Afebrile    Patient Vitals for the past 24 hrs:   Temp Pulse Resp BP SpO2   08/11/20 0840 98 ??F (36.7 ??C) 62 16 (!) 96/54 92 %   08/11/20 0112 97.7 ??F (36.5 ??C) 60 18 (!) 102/53 93 %   08/10/20 1521 -- -- -- 106/64 --     Imaging  Knee xray:  Study Result    Narrative & Impression   EXAM: XR KNEE LT 3 V  ??  INDICATION: lt knee redness and pain..  ??  COMPARISON: None.  ??  FINDINGS: Three views of the left knee demonstrate mild narrowing of the medial  compartment. No fracture or bone destruction. There are vascular calcifications  and surgical clips. There is no effusion.  ??  IMPRESSION  No acute abnormality.     Unlikely septic knee in the absence of knee effusion.    Initiate gout treatment per medicine  Will watch over night if knee pain fails to improve  May need aspiration.  Will follow    Case discussed with DR Tiburcio Pea on call ortho

## 2020-08-11 NOTE — Progress Notes (Signed)
Patient discussed in Rounds. Recommended PT/OT hold pending MRI for WB precautions on R foot. Will continue to follow this patient.    Alba Cory, PT

## 2020-08-11 NOTE — Progress Notes (Signed)
Progress  Notes by Vena AustriaGolla, Nason Conradt K, MD at 08/11/20 1323                Author: Vena AustriaGolla, Byanka Landrus K, MD  Service: Internal Medicine  Author Type: Physician       Filed: 08/11/20 1327  Date of Service: 08/11/20 1323  Status: Addendum          Editor: Vena AustriaGolla, Sharifa Bucholz K, MD (Physician)          Related Notes: Original Note by Vena AustriaGolla, Rhiannan Kievit K, MD (Physician) filed at 08/11/20 1327                       Hospitalist Progress Note      NAME: Michael Lozano    DOB:  1934/09/28    MRN:  [id number]          Assessment / Plan:     Rt foot non healing Diabetic ulcer    Peripheral neuropathy   -Duplex US neg, xr R foot neg     -Pending MRI of foot   -Cont vancomycin and zosyn. Podiatry consult pending         Left knee swelling and redness likely gout but rule out septic arthritis    -Start colchicine and allopurinol   -Avoid nonsteroidals due to CKD   -Consult Ortho for arthrocentesis.  He is already on empiric antibiotics for right foot ulcer      CKD3  With Acute kidney injury  Cr today 1.9   Cr  Is 1.5 at baseline.  Creatinine is 1.77 today.  Check BMP in a.m. hold lisinopril.  On IV fluids   ??   ??   Hx of DVT not on AC, US doppler negative for DVT   Hx of GERD   HLD??   HTN   Hx Gout   Hx Neuropathy??   -Cont home asa, Lipitor, lisinopril, PPI   -Cont home gabapentin    ??         Code Status:????Full   Surrogate Decision Maker:   DVT Prophylaxis:??lovenox SQ   GI Prophylaxis: not indicated   Baseline:          Body mass index is 22.81 kg/m??.                Subjective:        Chief Complaint / Reason for Physician Visit   Right foot pain is about 6 x 10, worse with touch and during dressing changes.  No fever.  No diarrhea.   Reports left knee swelling and redness         Review of Systems:           Symptom  Y/N  Comments    Symptom  Y/N  Comments             Fever/Chills        Chest Pain                 Poor Appetite        Edema         Cough        Abdominal Pain                 Sputum        Joint Pain         SOB/DOE         Pruritis/Rash         Nausea/vomit  Tolerating PT/OT         Diarrhea        Tolerating Diet         Constipation        Other               Could NOT obtain due to:            Objective:        VITALS:    Last 24hrs VS reviewed since prior progress note. Most recent are:   Patient Vitals for the past 24 hrs:            Temp  Pulse  Resp  BP  SpO2            08/11/20 0840  98 ??F (36.7 ??C)  62  16  (!) 96/54  92 %            08/11/20 0112  97.7 ??F (36.5 ??C)  60  18  (!) 102/53  93 %     08/10/20 1521  --  --  --  106/64  --            08/10/20 1434  98 ??F (36.7 ??C)  --  17  (!) 85/50  98 %           Intake/Output Summary (Last 24 hours) at 08/11/2020 1323   Last data filed at 08/11/2020 0515     Gross per 24 hour        Intake  2156.25 ml        Output  --        Net  2156.25 ml            PHYSICAL EXAM:   General: Alert, cooperative, no acute distress????   EENT:  EOMI. Anicteric sclerae. MMM   Resp:  CTA bilaterally, no wheezing or rales.  No accessory muscle use   CV:  Regular  rhythm,?? No edema   GI:  Soft, Non distended, Non tender. ??+Bowel sounds   Neurologic:?? Alert and awake, normal speech,    Psych:???? Not anxious nor agitated   Skin:  Rt foot dressing +, left knee redness and increased warmth present      Reviewed most current lab test results and cultures  YES   Reviewed most current radiology test results   YES   Review and summation of old records today    NO   Reviewed patient's current orders and MAR    YES   PMH/SH reviewed - no change compared  to H&P               Current Facility-Administered Medications:    ?  Vancomycin level at 2100, 1 Each, Other, ONCE, Keelie Zemanek K, MD   ?  colchicine tablet 1.2 mg, 1.2 mg, Oral, ONCE, Malone Vanblarcom K, MD   ?  colchicine tablet 0.6 mg, 0.6 mg, Oral, DAILY, Leiloni Smithers K, MD   ?  allopurinoL (ZYLOPRIM) tablet 100 mg, 100 mg, Oral, DAILY, Elbert Spickler K, MD   ?  insulin lispro (HUMALOG) injection, , SubCUTAneous, AC&HS, Marquita Lias K, MD   ?  glucose  chewable tablet 16 g, 4 Tablet, Oral, PRN, Darlina Rumpf, Carlynn Herald, MD   ?  glucagon (GLUCAGEN) injection 1 mg, 1 mg, IntraMUSCular, PRN, Ammie Warrick K, MD   ?  dextrose 10% infusion 0-250 mL, 0-250 mL, IntraVENous, PRN, Darlina Rumpf, Carlynn Herald, MD   ?  traMADoL (ULTRAM) tablet 50  mg, 50 mg, Oral, Q6H PRN, Vena Austria, MD, 50 mg at 08/11/20 1133   ?  aspirin delayed-release tablet 81 mg, 81 mg, Oral, DAILY, Din, Bufford Spikes, MD, 81 mg at 08/11/20 0935   ?  atorvastatin (LIPITOR) tablet 40 mg, 40 mg, Oral, DAILY, Din, Bufford Spikes, MD, 40 mg at 08/11/20 0935   ?  [Held by provider] lisinopriL (PRINIVIL, ZESTRIL) tablet 2.5 mg, 2.5 mg, Oral, DAILY, Din, Nizam U, MD, 2.5 mg at 08/10/20 1038   ?  sodium chloride (NS) flush 5-40 mL, 5-40 mL, IntraVENous, Q8H, Din, Nizam U, MD, 10 mL at 08/11/20 0535   ?  sodium chloride (NS) flush 5-40 mL, 5-40 mL, IntraVENous, PRN, Din, Bufford Spikes, MD   ?  acetaminophen (TYLENOL) tablet 650 mg, 650 mg, Oral, Q6H PRN, 650 mg at 08/10/20 1754 **OR** acetaminophen (TYLENOL) suppository 650 mg, 650 mg, Rectal, Q6H PRN, Din, Nizam U, MD   ?  polyethylene glycol (MIRALAX) packet 17 g, 17 g, Oral, DAILY PRN, Din, Nizam U, MD   ?  ondansetron (ZOFRAN ODT) tablet 4 mg, 4 mg, Oral, Q8H PRN **OR** ondansetron (ZOFRAN) injection 4 mg, 4 mg, IntraVENous, Q6H PRN, Din, Bufford Spikes, MD   ?  [Held by provider] enoxaparin (LOVENOX) injection 40 mg, 40 mg, SubCUTAneous, DAILY, Din, Nizam U, MD, 40 mg at 08/10/20 1038   ?  piperacillin-tazobactam (ZOSYN) 3.375 g in 0.9% sodium chloride (MBP/ADV) 100 mL MBP, 3.375 g, IntraVENous, Q8H, Din, Nizam U, MD, Last Rate: 25 mL/hr at 08/11/20 0937, 3.375 g at 08/11/20 0272   ?  0.9% sodium chloride infusion, 100 mL/hr, IntraVENous, CONTINUOUS, Vena Austria, MD, Last Rate: 100 mL/hr at 08/11/20 0938, 100 mL/hr at 08/11/20 0938   ?  pantoprazole (PROTONIX) tablet 40 mg, 40 mg, Oral, ACB, Din, Bufford Spikes, MD, 40 mg at 08/11/20 5366   ?  gabapentin (NEURONTIN) capsule 100 mg, 100 mg, Oral, BID, Din,  Nizam U, MD, 100 mg at 08/11/20 4403   ?  vancomycin (VANCOCIN) 750 mg in 0.9% sodium chloride 250 mL (VIAL-MATE), 750 mg, IntraVENous, Q24H, Din, Bufford Spikes, MD, Stopped at 08/11/20 4742   ________________________________________________________________________   Care Plan discussed with:           Comments         Patient  y           Family              RN  y       Care Manager             Consultant                              Multidiciplinary team rounds were held today with case manager, nursing, pharmacist and clinical coordinator.  Patient's plan of care was discussed;  medications were reviewed and discharge planning was addressed.       ________________________________________________________________________   Total NON critical care TIME:  35    Minutes      Total CRITICAL CARE TIME Spent:   Minutes non procedure based              Comments         >50% of visit spent in counseling and coordination of care         ________________________________________________________________________   Vena Austria, MD       Procedures: see electronic  medical records for all procedures/Xrays and details which were not copied into this note but were reviewed prior to creation of Plan.        LABS:   I reviewed today's most current labs and imaging studies.   Pertinent labs include:     Recent Labs            08/11/20   0514  08/09/20   2019     WBC  4.6  8.3     HGB  10.6*  12.8     HCT  31.1*  38.2         PLT  224  357          Recent Labs             08/11/20   0514  08/10/20   0444  08/09/20   2019     NA  139  136  133*     K  4.0  4.1  4.6     CL  111*  107  107     CO2  22  22  24      GLU  82  102*  108*     BUN  21*  22*  24*     CREA  1.77*  1.65*  1.98*     CA  8.7  9.2  10.2*     MG   --   2.4   --      ALB   --    --   3.7     TBILI   --    --   0.6          ALT   --    --   21           Signed: , MD

## 2020-08-11 NOTE — Progress Notes (Signed)
MRI PENDING    Noted on MRI screening sheet pt has a ICD    We need a card faxed to the department so we can see if the ICD is conditional for a MRI    We also need the name of the cardiologist that follows the pt      Fax  [phone]

## 2020-08-11 NOTE — Progress Notes (Signed)
End of Shift Note    Bedside shift change report given to Marisue Ivan, RN by Conchita Paris, RN.  Report included the following information:     Shift worked:  1900-0730   Shift summary and any significant changes:     Patient occasionally refusing nursing cares, BG, VS- able to educate on importance and agreeable at times to cares. Provider aware. IVFs infusing. Refusing straight cath- last PVR 252.      Concerns for physician to address:  Wound care orders?     Zone phone for oncoming shift:          Activity:  Activity Level: Up with Assistance      Genitourinary:   Urinary status: Voiding, retention and bladder scan QID & prn.    Respiratory:   O2 Device: None (Room air)    GI:  Last Bowel Movement Date: 08/10/20  Current diet:  ADULT DIET Regular; 3 carb choices (45 gm/meal)    Pain Management:   Patient states pain is manageable on current regimen: YES    Skin:  Braden Score: 19    Patient Safety:  Fall Score: Total Score: 4    High Fall Risk: Yes    Length of Stay:  Expected LOS: - - -  Actual LOS: 2      Conchita Paris, RN

## 2020-08-11 NOTE — Progress Notes (Signed)
End of Shift Note    Bedside shift change report given to Tiffany RN (oncoming nurse) by Nolon Rod, RN (offgoing nurse).  Report included the following information SBAR, Kardex, Procedure Summary, Intake/Output, MAR, Accordion and Recent Results    Shift worked:  7a-7p     Shift summary and any significant changes:     Pt refusing BG check in am, this RN discussed the importance of keeping BG in control in the presence of wound on foot, pt states that is not true. We were able to get BG later in the day. Pt c/o left knee pain, area red, warm, gout meds ordered, Ortho consulted, xray done. Notified Dr Darlina Rumpf. Paired blood cx sent. Pt heavy 1 asst to amb to BR.     Concerns for physician to address:  none     Zone phone for oncoming shift:   [phone]       Activity:  Activity Level: Up with Assistance  Number times ambulated in hallways past shift: 0  Number of times OOB to chair past shift: 1    Cardiac:   Cardiac Monitoring: No           Access:   Current line(s): PIV     Genitourinary:   Urinary status: voiding    Respiratory:   O2 Device: None (Room air)  Chronic home O2 use?: NO  Incentive spirometer at bedside: NO       GI:  Last Bowel Movement Date: 08/10/20  Current diet:  ADULT DIET Regular; 3 carb choices (45 gm/meal)  Passing flatus: YES  Tolerating current diet: YES       Pain Management:   Patient states pain is manageable on current regimen: N/A    Skin:  Braden Score: 19  Interventions: increase time out of bed    Patient Safety:  Fall Score: Total Score: 4  Interventions: gripper socks  High Fall Risk: Yes    Length of Stay:  Expected LOS: 2d 21h  Actual LOS: 2      Nolon Rod, RN

## 2020-08-11 NOTE — Progress Notes (Signed)
Patient discussed in Rounds. Recommended PT/OT hold pending MRI for WB precautions on R foot. Will continue to follow this patient.

## 2020-08-11 NOTE — Progress Notes (Signed)
P.t. refused to let PCT check blood sugar at 0800.  P.t. educated on the importance of monitoring glucose AC&HS.

## 2020-08-12 ENCOUNTER — Inpatient Hospital Stay: Admit: 2020-08-12 | Payer: MEDICARE | Primary: Adult Health

## 2020-08-12 LAB — VANCOMYCIN, TROUGH: Vancomycin,trough: 13.9 ug/mL — ABNORMAL HIGH (ref 5.0–10.0)

## 2020-08-12 LAB — METABOLIC PANEL, BASIC
Anion gap: 6 mmol/L (ref 5–15)
BUN/Creatinine ratio: 14 (ref 12–20)
BUN: 20 MG/DL (ref 6–20)
CO2: 22 mmol/L (ref 21–32)
Calcium: 8.4 MG/DL — ABNORMAL LOW (ref 8.5–10.1)
Chloride: 113 mmol/L — ABNORMAL HIGH (ref 97–108)
Creatinine: 1.44 MG/DL — ABNORMAL HIGH (ref 0.70–1.30)
GFR est AA: 57 mL/min/{1.73_m2} — ABNORMAL LOW (ref >60)
GFR est non-AA: 47 mL/min/{1.73_m2} — ABNORMAL LOW (ref >60)
Glucose: 82 mg/dL (ref 65–100)
Potassium: 4.2 mmol/L (ref 3.5–5.1)
Sodium: 141 mmol/L (ref 136–145)

## 2020-08-12 LAB — GLUCOSE, POC
Glucose (POC): 79 mg/dL (ref 65–117)
Glucose (POC): 109 mg/dL (ref 65–117)
Glucose (POC): 153 mg/dL — ABNORMAL HIGH (ref 65–117)
Glucose (POC): 96 mg/dL (ref 65–117)

## 2020-08-12 LAB — SAMPLES BEING HELD

## 2020-08-12 LAB — BASIC METABOLIC PANEL
Anion Gap: 6 mmol/L (ref 5–15)
BUN: 20 MG/DL (ref 6–20)
Bun/Cre Ratio: 14 (ref 12–20)
CO2: 22 mmol/L (ref 21–32)
Calcium: 8.4 MG/DL — ABNORMAL LOW (ref 8.5–10.1)
Chloride: 113 mmol/L — ABNORMAL HIGH (ref 97–108)
Creatinine: 1.44 MG/DL — ABNORMAL HIGH (ref 0.70–1.30)
EGFR IF NonAfrican American: 47 mL/min/{1.73_m2} — ABNORMAL LOW (ref >60)
GFR African American: 57 mL/min/{1.73_m2} — ABNORMAL LOW (ref >60)
Glucose: 82 mg/dL (ref 65–100)
Potassium: 4.2 mmol/L (ref 3.5–5.1)
Sodium: 141 mmol/L (ref 136–145)

## 2020-08-12 LAB — POCT GLUCOSE
POC Glucose: 79 mg/dL (ref 65–117)
POC Glucose: 109 mg/dL (ref 65–117)
POC Glucose: 153 mg/dL — ABNORMAL HIGH (ref 65–117)
POC Glucose: 96 mg/dL (ref 65–117)

## 2020-08-12 LAB — VANCOMYCIN TROUGH: Vancomycin Tr: 13.9 ug/mL — ABNORMAL HIGH (ref 5.0–10.0)

## 2020-08-12 MED FILL — TRAMADOL 50 MG TAB: 50 mg | ORAL | Qty: 1

## 2020-08-12 MED FILL — INSULIN LISPRO 100 UNIT/ML INJECTION: 100 unit/mL | SUBCUTANEOUS | Qty: 1

## 2020-08-12 MED FILL — ALLOPURINOL 100 MG TAB: 100 mg | ORAL | Qty: 1

## 2020-08-12 MED FILL — GABAPENTIN 100 MG CAP: 100 mg | ORAL | Qty: 1

## 2020-08-12 MED FILL — ASPIRIN 81 MG TAB, DELAYED RELEASE: 81 mg | ORAL | Qty: 1

## 2020-08-12 MED FILL — ZOSYN 3.375 GRAM INTRAVENOUS SOLUTION: 3.375 gram | INTRAVENOUS | Qty: 3.38

## 2020-08-12 MED FILL — COLCHICINE 0.6 MG TAB: 0.6 mg | ORAL | Qty: 1

## 2020-08-12 MED FILL — PANTOPRAZOLE 40 MG TAB, DELAYED RELEASE: 40 mg | ORAL | Qty: 1

## 2020-08-12 MED FILL — VANCOMYCIN 750 MG IV SOLUTION: 750 mg | INTRAVENOUS | Qty: 750

## 2020-08-12 MED FILL — ATORVASTATIN 40 MG TAB: 40 mg | ORAL | Qty: 1
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
## 2020-08-12 NOTE — Progress Notes (Signed)
Problem: Risk for Spread of Infection  Goal: Prevent transmission of infectious organism to others  Description: Prevent the transmission of infectious organisms to other patients, staff members, and visitors.  Outcome: Progressing Towards Goal     Problem: Falls - Risk of  Goal: *Absence of Falls  Description: Document Michael Lozano Fall Risk and appropriate interventions in the flowsheet.  Outcome: Progressing Towards Goal  Note: Fall Risk Interventions:  Mobility Interventions: Bed/chair exit alarm,Patient to call before getting OOB    Mentation Interventions: Bed/chair exit alarm    Medication Interventions: Teach patient to arise slowly,Bed/chair exit alarm,Patient to call before getting OOB    Elimination Interventions: Call light in reach,Bed/chair exit alarm

## 2020-08-12 NOTE — Progress Notes (Signed)
Pharmacy Antimicrobial Kinetic Dosing    Vancomycin Goal Level: AUC: 400-600 mg/hr/Liter/day    Date Dose & Interval Measured (mcg/mL) Predicted AUC/MIC   6/27 21:36 750 mg q24h 13.9 441                 Impression/Plan:   The vancomycin level resulted at 13.9 mcg/ml (AUC 441). Will continue with the current regimen of 750 mg every 24 hours.      Pharmacy will follow daily and adjust medications as appropriate for renal function and/or serum levels.    Thank you,  Ellwood Dense, PHARMD

## 2020-08-12 NOTE — Progress Notes (Signed)
End of Shift Note    Bedside shift change report given to [facility] by Conchita Paris, RN.  Report included the following information     Shift worked:  1900-0730     Shift summary and any significant changes:    Patient continuing to occasionally refuse nursing cares, BG, VS, lab draws- able to educate on importance and agreeable at times to cares. IVFs infusing. Voiding frequently clear/yellow urine. Complaining of pain to left knee given prn pain meds per orders.     Concerns for physician to address:       Zone phone for oncoming shift:            Length of Stay:  Expected LOS: 2d 21h  Actual LOS: 3      Conchita Paris, RN

## 2020-08-12 NOTE — Progress Notes (Signed)
1105 bladder scan pt refused straight cath. Gave pt urinal, not able to urinate but still refuses straight cath. This RN educated about importance of following orders and the need to be straight cathed.           12 pt daughter, Bonita Quin called this RN back, did not know the type of device pt has but was able to give me number of cardiologist association number that prescribed it. [facility] [place] [phone]. Fax [phone]    End of Shift Note    Bedside shift change report given t (oncoming nurse) by Scherry Ran (offgoing nurse).  Report included the following information SBAR, Kardex and MAR    Shift worked:  0700-1900     Shift summary and any significant changes:     see above     Concerns for physician to address:  none     Zone phone for oncoming shift:   [phone]       Activity:  Activity Level: Up with Assistance  Number times ambulated in hallways past shift: 0  Number of times OOB to chair past shift: 2    Cardiac:   Cardiac Monitoring: No           Access:   Current line(s): PIV     Genitourinary:   Urinary status: voiding    Respiratory:   O2 Device: None (Room air)  Chronic home O2 use?: NO  Incentive spirometer at bedside: NO unable to       GI:  Last Bowel Movement Date: 08/12/20  Current diet:  ADULT DIET Regular; 3 carb choices (45 gm/meal)  Passing flatus: YES  Tolerating current diet: YES       Pain Management:   Patient states pain is manageable on current regimen: YES    Skin:  Braden Score: 18  Interventions: float heels and increase time out of bed    Patient Safety:  Fall Score: Total Score: 4  Interventions: bed/chair alarm, assistive device (walker, cane, etc), gripper socks and pt to call before getting OOB  High Fall Risk: Yes    Length of Stay:  Expected LOS: 2d 21h  Actual LOS: 3      Pascal Lux Emswiller

## 2020-08-12 NOTE — Progress Notes (Signed)
OT note:  OT reviewed chart and attempted to see pt but he was very agitated after nursing tried to straight cath pt and then more agitated  when PCT was trying to get his BP. Will defer at this time and continue to follow

## 2020-08-12 NOTE — Progress Notes (Signed)
Progress  Notes by Vena Austria, MD at 08/12/20 1337                Author: Vena Austria, MD  Service: Internal Medicine  Author Type: Physician       Filed: 08/12/20 1339  Date of Service: 08/12/20 1337  Status: Signed          Editor: Vena Austria, MD (Physician)                       Hospitalist Progress Note      NAME: Michael Lozano    DOB:  20-Mar-1934    MRN:  [id number]          Assessment / Plan:     Rt foot non healing Diabetic ulcer    Peripheral neuropathy   -Duplex US neg, xr R foot neg     -Pending MRI of foot, has AICD in place. Called family to discuss details of cardiologist and also type of AICD but they did not pick up.  Patient is confused.   -Cont vancomycin and zosyn. Podiatry following         Left knee swelling and redness likely gout but rule out septic arthritis    -Continue colchicine and allopurinol   -Avoid nonsteroidals due to CKD   -Ortho is following     He is already on empiric antibiotics for right foot ulcer      CKD3  With Acute kidney injury  Cr today 1.9   Cr  Is 1.5 at baseline.  Holding lisinopril.   ??   Hx of DVT not on AC, US doppler negative for DVT   Hx of GERD   HLD??   HTN   Hx Gout   Hx Neuropathy??   -Cont home asa, Lipitor, lisinopril, PPI   -Cont home gabapentin    ??         Code Status:????Full   Surrogate Decision Maker:   DVT Prophylaxis:??lovenox SQ   GI Prophylaxis: not indicated   Baseline:          Body mass index is 22.81 kg/m??.                Subjective:        Chief Complaint / Reason for Physician Visit   Right foot pain is controlled   Has intermittent confusion         Review of Systems:           Symptom  Y/N  Comments    Symptom  Y/N  Comments             Fever/Chills        Chest Pain                 Poor Appetite        Edema         Cough        Abdominal Pain                 Sputum        Joint Pain         SOB/DOE        Pruritis/Rash         Nausea/vomit        Tolerating PT/OT         Diarrhea        Tolerating Diet         Constipation  Other               Could NOT obtain due to:            Objective:        VITALS:    Last 24hrs VS reviewed since prior progress note. Most recent are:   Patient Vitals for the past 24 hrs:            Temp  Pulse  Resp  BP  SpO2            08/12/20 0809  97.7 ??F (36.5 ??C)  66  18  107/60  97 %            08/12/20 0353  97.9 ??F (36.6 ??C)  66  18  (!) 118/58  99 %     08/12/20 0055  --  --  --  (!) 111/47  --     08/11/20 2000  98 ??F (36.7 ??C)  60  17  (!) 99/54  95 %            08/11/20 1534  98.2 ??F (36.8 ??C)  61  16  (!) 95/49  96 %           Intake/Output Summary (Last 24 hours) at 08/12/2020 1337   Last data filed at 08/12/2020 0810     Gross per 24 hour        Intake  1136.67 ml        Output  0 ml        Net  1136.67 ml            PHYSICAL EXAM:   General: Alert, cooperative, no acute distress????   EENT:  EOMI. Anicteric sclerae. MMM   Resp:  CTA bilaterally, no wheezing or rales.  No accessory muscle use   CV:  Regular  rhythm,?? No edema   GI:  Soft, Non distended, Non tender. ??+Bowel sounds   Neurologic:?? Alert and awake, normal speech,    Psych:???? Not anxious nor agitated   Skin:  Rt foot dressing +, left knee redness and increased warmth present      Reviewed most current lab test results and cultures  YES   Reviewed most current radiology test results   YES   Review and summation of old records today    NO   Reviewed patient's current orders and MAR    YES   PMH/SH reviewed - no change compared  to H&P               Current Facility-Administered Medications:    ?  colchicine tablet 0.6 mg, 0.6 mg, Oral, DAILY, Jaleigh Mccroskey K, MD, 0.6 mg at 08/12/20 1038   ?  allopurinoL (ZYLOPRIM) tablet 100 mg, 100 mg, Oral, DAILY, Vena Austria, MD, 100 mg at 08/12/20 0920   ?  insulin lispro (HUMALOG) injection, , SubCUTAneous, AC&HS, Vena Austria, MD, 2 Units at 08/12/20 1305   ?  glucose chewable tablet 16 g, 4 Tablet, Oral, PRN, Darlina Rumpf, Carlynn Herald, MD   ?  glucagon (GLUCAGEN) injection 1 mg, 1 mg, IntraMUSCular, PRN,  Cypress Hinkson K, MD   ?  dextrose 10% infusion 0-250 mL, 0-250 mL, IntraVENous, PRN, Darlina Rumpf, Carlynn Herald, MD   ?  traMADoL (ULTRAM) tablet 50 mg, 50 mg, Oral, Q6H PRN, Vena Austria, MD, 50 mg at 08/12/20 0057   ?  aspirin delayed-release tablet 81 mg, 81 mg, Oral, DAILY, Din, [employer],  MD, 81 mg at 08/12/20 0920   ?  atorvastatin (LIPITOR) tablet 40 mg, 40 mg, Oral, DAILY, Din, Bufford SpikesNizam U, MD, 40 mg at 08/12/20 0920   ?  [Held by provider] lisinopriL (PRINIVIL, ZESTRIL) tablet 2.5 mg, 2.5 mg, Oral, DAILY, Din, Nizam U, MD, 2.5 mg at 08/10/20 1038   ?  sodium chloride (NS) flush 5-40 mL, 5-40 mL, IntraVENous, Q8H, Din, Nizam U, MD, 10 mL at 08/11/20 2324   ?  sodium chloride (NS) flush 5-40 mL, 5-40 mL, IntraVENous, PRN, Din, Bufford SpikesNizam U, MD   ?  acetaminophen (TYLENOL) tablet 650 mg, 650 mg, Oral, Q6H PRN, 650 mg at 08/10/20 1754 **OR** acetaminophen (TYLENOL) suppository 650 mg, 650 mg, Rectal, Q6H PRN, Din, Nizam U, MD   ?  polyethylene glycol (MIRALAX) packet 17 g, 17 g, Oral, DAILY PRN, Din, Nizam U, MD   ?  ondansetron (ZOFRAN ODT) tablet 4 mg, 4 mg, Oral, Q8H PRN **OR** ondansetron (ZOFRAN) injection 4 mg, 4 mg, IntraVENous, Q6H PRN, Din, Bufford SpikesNizam U, MD   ?  [Held by provider] enoxaparin (LOVENOX) injection 40 mg, 40 mg, SubCUTAneous, DAILY, Din, Nizam U, MD, 40 mg at 08/10/20 1038   ?  piperacillin-tazobactam (ZOSYN) 3.375 g in 0.9% sodium chloride (MBP/ADV) 100 mL MBP, 3.375 g, IntraVENous, Q8H, Din, Nizam U, MD, Last Rate: 25 mL/hr at 08/12/20 0919, 3.375 g at 08/12/20 0919   ?  0.9% sodium chloride infusion, 100 mL/hr, IntraVENous, CONTINUOUS, Vena AustriaGolla, Elicia Lui K, MD, Last Rate: 100 mL/hr at 08/12/20 0411, 100 mL/hr at 08/12/20 0411   ?  pantoprazole (PROTONIX) tablet 40 mg, 40 mg, Oral, ACB, Din, Bufford SpikesNizam U, MD, 40 mg at 08/12/20 0920   ?  gabapentin (NEURONTIN) capsule 100 mg, 100 mg, Oral, BID, Din, Bufford SpikesNizam U, MD, 100 mg at 08/12/20 0920   ?  vancomycin (VANCOCIN) 750 mg in 0.9% sodium chloride 250 mL (VIAL-MATE), 750 mg,  IntraVENous, Q24H, Din, Nizam U, MD, Last Rate: 250 mL/hr at 08/11/20 2323, 750 mg at 08/11/20 2323   ________________________________________________________________________   Care Plan discussed with:           Comments         Patient  y           Family              RN  y       Care Manager             Consultant                              Multidiciplinary team rounds were held today with case manager, nursing, pharmacist and clinical coordinator.  Patient's plan of care was discussed;  medications were reviewed and discharge planning was addressed.       ________________________________________________________________________   Total NON critical care TIME:  35    Minutes      Total CRITICAL CARE TIME Spent:   Minutes non procedure based              Comments         >50% of visit spent in counseling and coordination of care         ________________________________________________________________________   Vena AustriaVarun K Zohar Maroney, MD       Procedures: see electronic medical records for all procedures/Xrays and details which were not copied into this note but were reviewed prior to creation of Plan.  LABS:   I reviewed today's most current labs and imaging studies.   Pertinent labs include:     Recent Labs            08/11/20   0514  08/09/20   2019     WBC  4.6  8.3     HGB  10.6*  12.8     HCT  31.1*  38.2         PLT  224  357          Recent Labs               08/12/20   0240  08/11/20   0514  08/10/20   0444  08/09/20   2019  08/09/20   2019     NA  141  139  136    < >  133*     K  4.2  4.0  4.1    < >  4.6     CL  113*  111*  107    < >  107     CO2  22  22  22     < >  24     GLU  82  82  102*    < >  108*     BUN  20  21*  22*    < >  24*     CREA  1.44*  1.77*  1.65*    < >  1.98*     CA  8.4*  8.7  9.2    < >  10.2*     MG   --    --   2.4   --    --      ALB   --    --    --    --   3.7     TBILI   --    --    --    --   0.6     ALT   --    --    --    --   21        < > = values in this interval not  displayed.           Signed: , MD
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
## 2020-08-12 NOTE — Progress Notes (Signed)
Problem: Risk for Spread of Infection  Goal: Prevent transmission of infectious organism to others  Description: Prevent the transmission of infectious organisms to other patients, staff members, and visitors.  Outcome: Progressing Towards Goal     Problem: Falls - Risk of  Goal: *Absence of Falls  Description: Document Michael Lozano Fall Risk and appropriate interventions in the flowsheet.  Outcome: Progressing Towards Goal  Note: Fall Risk Interventions:  Mobility Interventions: Bed/chair exit alarm,Utilize walker, cane, or other assistive device    Mentation Interventions: Bed/chair exit alarm    Medication Interventions: Teach patient to arise slowly,Patient to call before getting OOB,Bed/chair exit alarm    Elimination Interventions: Call light in reach,Bed/chair exit alarm,Patient to call for help with toileting needs
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
## 2020-08-12 NOTE — Progress Notes (Signed)
Progress  Notes by Kathlyn Sacramento, [place] at 08/12/20 1150                Author: Kathlyn Sacramento, PA  Service: Orthopedic Surgery  Author Type: Physician Assistant       Filed: 08/12/20 1443  Date of Service: 08/12/20 1150  Status: Signed           Editor: Kathlyn Sacramento, [place] (Physician Assistant)  Cosigner: Santiago Bur, MD at 08/14/20 [phone]               ORTHO - Progress Note   Post Op day: * No surgery found Michael Lozano     [id number]   male    86 y.o.    1934-07-16      Admit date:08/09/2020              SUBJECTIVE:        Michael Lozano is a 86 y.o.  male resting in the chair. Patient complains of mainly anterior knee pain. "I can walk on it, but it hurts some when I bend it." " It feels  a little better today but still hurts."   Denies F/C, nausea, vomiting, dizziness, lightheadedness, chest pain, or shortness of breath.        OBJECTIVE:           Physical Exam:   General: alert, cooperative, no distress.    Gastrointestinal:  non-distended .     Cardiovascular: 2+ pulses in the left lower extremities, RLE foot wrapped in dressing.  Brisk cap refill in all distal extremities LLE   Genitourinary: Voiding independently    Respiratory: No respiratory distress    Neurological:Neurovascular exam within normal limits.      Senstion intact: LE bilat.     Motor: + DF/PF/EHL.    Musculoskeletal:  Patient able to weight bear LLE. Knee flexed in chair. Able to tolerate PROM to full extension and full flexion with referred pain to anterior patella. Pain to palpation over the patella, but no obvious fluctuance. No pain to palpation  of the joint line medial/lateral. No effusion appreciated on exam.  Mild erythema over the patella. Prominent tibial tuberosity bilaterally.   .               Vital Signs:          Patient Vitals for the past 8 hrs:            BP  Temp  Pulse  Resp  SpO2            08/12/20 0809  107/60  97.7 ??F (36.5 ??C)  66  18  97 %                                             Temp (24hrs), Avg:98 ??F (36.7  ??C), Min:97.7 ??F (36.5 ??C), Max:98.2 ??F (36.8 ??C)           Labs:              Recent Labs           08/11/20   0514     HCT  31.1*        HGB  10.6*          PT/OT:  ASSESSMENT / PLAN:     Active Problems:     AKI (acute kidney injury) ([place]) (06/02/2019)        DM (diabetes mellitus) ([place]) (08/09/2020)        CKD (chronic kidney disease) (08/09/2020)        Diabetic foot ulcer ([place]) (08/09/2020)        Noncompliance (08/09/2020)        Diabetic foot infection ([place]) (08/09/2020)          Prepatellar bursitis of the left knee         -  Continue PT/OT WBAT   - Pain localized to anterior patella with ROM or palpation indicating prepatellar bursitis.   - No effusion on exam, tolerating ROM, and able to weight bear.    -  ACE bandage to Left knee for comfort. Ice PRN.   - Clinically, this does not appear to be an infected joint given the patients physical exam and no concerns for infection on lab work (ESR and WBC WNL)   - Please notify us if symptoms worsen or any concern for joint involvement.      This case was discussed with Dr Kenton Kingfisher who agrees with the above.      Signed By: Kathlyn Sacramento, PA                            Scan on 08/12/2020 1150 by Kathlyn Sacramento, PA 1

## 2020-08-13 ENCOUNTER — Inpatient Hospital Stay: Admit: 2020-08-13 | Payer: MEDICARE | Primary: Adult Health

## 2020-08-13 LAB — CBC W/O DIFF
ABSOLUTE NRBC: 0 10*3/uL (ref 0.00–0.01)
HCT: 30.1 % — ABNORMAL LOW (ref 36.6–50.3)
HGB: 10 g/dL — ABNORMAL LOW (ref 12.1–17.0)
MCH: 28.4 PG (ref 26.0–34.0)
MCHC: 33.2 g/dL (ref 30.0–36.5)
MCV: 85.5 FL (ref 80.0–99.0)
MPV: 11.3 FL (ref 8.9–12.9)
NRBC: 0 PER 100 WBC
PLATELET: 225 10*3/uL (ref 150–400)
RBC: 3.52 M/uL — ABNORMAL LOW (ref 4.10–5.70)
RDW: 15.3 % — ABNORMAL HIGH (ref 11.5–14.5)
WBC: 3.9 10*3/uL — ABNORMAL LOW (ref 4.1–11.1)

## 2020-08-13 LAB — GLUCOSE, POC
Glucose (POC): 112 mg/dL (ref 65–117)
Glucose (POC): 87 mg/dL (ref 65–117)
Glucose (POC): 107 mg/dL (ref 65–117)
Glucose (POC): 118 mg/dL — ABNORMAL HIGH (ref 65–117)

## 2020-08-13 LAB — METABOLIC PANEL, BASIC
Anion gap: 5 mmol/L (ref 5–15)
BUN/Creatinine ratio: 15 (ref 12–20)
BUN: 20 MG/DL (ref 6–20)
CO2: 22 mmol/L (ref 21–32)
Calcium: 8.7 MG/DL (ref 8.5–10.1)
Chloride: 115 mmol/L — ABNORMAL HIGH (ref 97–108)
Creatinine: 1.35 MG/DL — ABNORMAL HIGH (ref 0.70–1.30)
GFR est AA: >60 mL/min/{1.73_m2} (ref >60)
GFR est non-AA: 50 mL/min/{1.73_m2} — ABNORMAL LOW (ref >60)
Glucose: 79 mg/dL (ref 65–100)
Potassium: 4.5 mmol/L (ref 3.5–5.1)
Sodium: 142 mmol/L (ref 136–145)

## 2020-08-13 LAB — CBC
Hematocrit: 30.1 % — ABNORMAL LOW (ref 36.6–50.3)
Hemoglobin: 10 g/dL — ABNORMAL LOW (ref 12.1–17.0)
MCH: 28.4 PG (ref 26.0–34.0)
MCHC: 33.2 g/dL (ref 30.0–36.5)
MCV: 85.5 FL (ref 80.0–99.0)
MPV: 11.3 FL (ref 8.9–12.9)
NRBC Absolute: 0 10*3/uL (ref 0.00–0.01)
Nucleated RBCs: 0 PER 100 WBC
Platelets: 225 10*3/uL (ref 150–400)
RBC: 3.52 M/uL — ABNORMAL LOW (ref 4.10–5.70)
RDW: 15.3 % — ABNORMAL HIGH (ref 11.5–14.5)
WBC: 3.9 10*3/uL — ABNORMAL LOW (ref 4.1–11.1)

## 2020-08-13 LAB — POCT GLUCOSE
POC Glucose: 112 mg/dL (ref 65–117)
POC Glucose: 87 mg/dL (ref 65–117)
POC Glucose: 107 mg/dL (ref 65–117)
POC Glucose: 118 mg/dL — ABNORMAL HIGH (ref 65–117)

## 2020-08-13 LAB — BASIC METABOLIC PANEL
Anion Gap: 5 mmol/L (ref 5–15)
BUN: 20 MG/DL (ref 6–20)
Bun/Cre Ratio: 15 (ref 12–20)
CO2: 22 mmol/L (ref 21–32)
Calcium: 8.7 MG/DL (ref 8.5–10.1)
Chloride: 115 mmol/L — ABNORMAL HIGH (ref 97–108)
Creatinine: 1.35 MG/DL — ABNORMAL HIGH (ref 0.70–1.30)
EGFR IF NonAfrican American: 50 mL/min/{1.73_m2} — ABNORMAL LOW (ref >60)
GFR African American: >60 mL/min/{1.73_m2} (ref >60)
Glucose: 79 mg/dL (ref 65–100)
Potassium: 4.5 mmol/L (ref 3.5–5.1)
Sodium: 142 mmol/L (ref 136–145)

## 2020-08-13 MED ORDER — PHARMACY INFORMATION NOTE
Freq: Once | Status: AC
Start: 2020-08-13 — End: 2020-08-14

## 2020-08-13 MED ORDER — IOPAMIDOL 76 % IV SOLN
37076 mg iodine /mL (76 %) | Freq: Once | INTRAVENOUS | Status: AC
Start: 2020-08-13 — End: 2020-08-13
  Administered 2020-08-13: 17:00:00 via INTRAVENOUS

## 2020-08-13 MED FILL — ATORVASTATIN 40 MG TAB: 40 mg | ORAL | Qty: 1

## 2020-08-13 MED FILL — ALLOPURINOL 100 MG TAB: 100 mg | ORAL | Qty: 1

## 2020-08-13 MED FILL — ASPIRIN 81 MG TAB, DELAYED RELEASE: 81 mg | ORAL | Qty: 1

## 2020-08-13 MED FILL — TRAMADOL 50 MG TAB: 50 mg | ORAL | Qty: 1

## 2020-08-13 MED FILL — ISOVUE-370  76 % INTRAVENOUS SOLUTION: 370 mg iodine /mL (76 %) | INTRAVENOUS | Qty: 100

## 2020-08-13 MED FILL — VANCOMYCIN 750 MG IV SOLUTION: 750 mg | INTRAVENOUS | Qty: 750

## 2020-08-13 MED FILL — GABAPENTIN 100 MG CAP: 100 mg | ORAL | Qty: 1

## 2020-08-13 MED FILL — ZOSYN 3.375 GRAM INTRAVENOUS SOLUTION: 3.375 gram | INTRAVENOUS | Qty: 3.38

## 2020-08-13 MED FILL — PANTOPRAZOLE 40 MG TAB, DELAYED RELEASE: 40 mg | ORAL | Qty: 1

## 2020-08-13 MED FILL — COLCHICINE 0.6 MG TAB: 0.6 mg | ORAL | Qty: 1

## 2020-08-13 MED FILL — PHARMACY INFORMATION NOTE: Qty: 1
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
## 2020-08-13 NOTE — Progress Notes (Signed)
End of Shift Note    Bedside shift change report given to Clarisse Gouge, RN and [employer], RN by Conchita Paris, RN.  Report included the following information     Shift worked: 1900-0730     Shift summary and any significant changes:    Patient impulsive at times and getting OOB without assistance- educated on need to call for safety. IVFs infusing. Voiding frequently clear/yellow urine. Complaining of pain to left knee given prn pain meds per orders.     Concerns for physician to address:       Zone phone for oncoming shift:            Length of Stay:  Expected LOS: 2d 21h  Actual LOS: 4      Conchita Paris, RN
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
## 2020-08-13 NOTE — Progress Notes (Signed)
Progress  Notes by Vena Austria, MD at 08/13/20 1401                Author: Vena Austria, MD  Service: Internal Medicine  Author Type: Physician       Filed: 08/13/20 1405  Date of Service: 08/13/20 1401  Status: Signed          Editor: Vena Austria, MD (Physician)                       Hospitalist Progress Note      NAME: Michael Lozano    DOB:  11-06-1934    MRN:  [id number]          Assessment / Plan:     Rt foot non healing Diabetic ulcer    Peripheral neuropathy   -Duplex US neg, xr R foot neg    -MRI cannot be done due to AICD and patient's cardiologist is based in [place]   -We will get CT of right leg with contrast   -Cont vancomycin and zosyn. Podiatry following      Right leg PAD   -Vascular surgery following and will coordinate with podiatry         Left knee swelling and redness likely gouty arthritis   -Continue colchicine and allopurinol   -Avoid nonsteroidals due to CKD   -Ortho is following      CKD3  With Acute kidney injury  Cr today 1.9   Cr  Is 1.5 at baseline.  Holding lisinopril.   ??   Hx of DVT not on AC, US doppler negative for DVT   Hx of GERD   HLD??   HTN   Hx Gout   Hx Neuropathy??   -Cont home asa, Lipitor, lisinopril, PPI   -Cont home gabapentin    ??         Code Status:????Full   Surrogate Decision Maker:   DVT Prophylaxis:??lovenox SQ   GI Prophylaxis: not indicated   Baseline:          Body mass index is 23.35 kg/m??.      EDD: 7/2 once podiatry work-up is complete          Subjective:        Chief Complaint / Reason for Physician Visit   Right foot pain is controlled   No fever or chills   Waiting on MRI         Review of Systems:           Symptom  Y/N  Comments    Symptom  Y/N  Comments             Fever/Chills        Chest Pain                 Poor Appetite        Edema         Cough        Abdominal Pain                 Sputum        Joint Pain         SOB/DOE        Pruritis/Rash         Nausea/vomit        Tolerating PT/OT         Diarrhea        Tolerating Diet  Constipation        Other               Could NOT obtain due to:            Objective:        VITALS:    Last 24hrs VS reviewed since prior progress note. Most recent are:   Patient Vitals for the past 24 hrs:            Temp  Pulse  Resp  BP  SpO2            08/13/20 0750  97.5 ??F (36.4 ??C)  87  18  (!) 128/57  96 %            08/13/20 0132  97.9 ??F (36.6 ??C)  62  18  133/61  98 %     08/12/20 1924  98.3 ??F (36.8 ??C)  62  18  (!) 102/49  100 %            08/12/20 1545  97.7 ??F (36.5 ??C)  (!) 59  18  (!) 99/47  98 %           Intake/Output Summary (Last 24 hours) at 08/13/2020 1401   Last data filed at 08/13/2020 0127     Gross per 24 hour        Intake  595 ml        Output  2400 ml        Net  -1805 ml            PHYSICAL EXAM:   General: Alert, cooperative, no acute distress????   EENT:  EOMI. Anicteric sclerae. MMM   Resp:  CTA bilaterally, no wheezing or rales.  No accessory muscle use   CV:  Regular  rhythm,?? No edema   GI:  Soft, Non distended, Non tender. ??+Bowel sounds   Neurologic:?? Alert and awake, normal speech,    Psych:???? Not anxious nor agitated   Skin:  Rt foot dressing +, left knee tenderness present      Reviewed most current lab test results and cultures  YES   Reviewed most current radiology test results   YES   Review and summation of old records today    NO   Reviewed patient's current orders and MAR    YES   PMH/SH reviewed - no change compared  to H&P               Current Facility-Administered Medications:    ?  Vanc level at 0100, 1 Each, Other, ONCE, Jaymason Ledesma K, MD   ?  colchicine tablet 0.6 mg, 0.6 mg, Oral, DAILY, Cynde Menard K, MD, 0.6 mg at 08/13/20 0907   ?  allopurinoL (ZYLOPRIM) tablet 100 mg, 100 mg, Oral, DAILY, Vena AustriaGolla, Edessa Jakubowicz K, MD, 100 mg at 08/13/20 0906   ?  insulin lispro (HUMALOG) injection, , SubCUTAneous, AC&HS, Vena AustriaGolla, Jassen Sarver K, MD, 2 Units at 08/12/20 1305   ?  glucose chewable tablet 16 g, 4 Tablet, Oral, PRN, Darlina RumpfGolla, Carlynn HeraldVarun K, MD   ?  glucagon (GLUCAGEN) injection 1  mg, 1 mg, IntraMUSCular, PRN, Kerwin Augustus K, MD   ?  dextrose 10% infusion 0-250 mL, 0-250 mL, IntraVENous, PRN, Darlina RumpfGolla, Carlynn HeraldVarun K, MD   ?  traMADoL (ULTRAM) tablet 50 mg, 50 mg, Oral, Q6H PRN, Vena AustriaGolla, Coty Student K, MD, 50 mg at 08/13/20 0150   ?  aspirin delayed-release tablet 81 mg, 81 mg,  Oral, DAILY, Gordy Savers, MD, 81 mg at 08/13/20 8469   ?  atorvastatin (LIPITOR) tablet 40 mg, 40 mg, Oral, DAILY, Din, Bufford Spikes, MD, 40 mg at 08/13/20 0906   ?  [Held by provider] lisinopriL (PRINIVIL, ZESTRIL) tablet 2.5 mg, 2.5 mg, Oral, DAILY, Din, Nizam U, MD, 2.5 mg at 08/10/20 1038   ?  sodium chloride (NS) flush 5-40 mL, 5-40 mL, IntraVENous, Q8H, Din, Nizam U, MD, 5 mL at 08/13/20 1347   ?  sodium chloride (NS) flush 5-40 mL, 5-40 mL, IntraVENous, PRN, Din, Bufford Spikes, MD, 10 mL at 08/12/20 1717   ?  acetaminophen (TYLENOL) tablet 650 mg, 650 mg, Oral, Q6H PRN, 650 mg at 08/10/20 1754 **OR** acetaminophen (TYLENOL) suppository 650 mg, 650 mg, Rectal, Q6H PRN, Din, Nizam U, MD   ?  polyethylene glycol (MIRALAX) packet 17 g, 17 g, Oral, DAILY PRN, Din, Nizam U, MD   ?  ondansetron (ZOFRAN ODT) tablet 4 mg, 4 mg, Oral, Q8H PRN **OR** ondansetron (ZOFRAN) injection 4 mg, 4 mg, IntraVENous, Q6H PRN, Din, Bufford Spikes, MD   ?  [Held by provider] enoxaparin (LOVENOX) injection 40 mg, 40 mg, SubCUTAneous, DAILY, Din, Nizam U, MD, 40 mg at 08/10/20 1038   ?  piperacillin-tazobactam (ZOSYN) 3.375 g in 0.9% sodium chloride (MBP/ADV) 100 mL MBP, 3.375 g, IntraVENous, Q8H, Din, Nizam U, MD, Last Rate: 25 mL/hr at 08/13/20 0906, 3.375 g at 08/13/20 0906   ?  0.9% sodium chloride infusion, 100 mL/hr, IntraVENous, CONTINUOUS, Vena Austria, MD, Last Rate: 100 mL/hr at 08/12/20 1720, 100 mL/hr at 08/12/20 1720   ?  pantoprazole (PROTONIX) tablet 40 mg, 40 mg, Oral, ACB, Din, Bufford Spikes, MD, 40 mg at 08/13/20 0650   ?  gabapentin (NEURONTIN) capsule 100 mg, 100 mg, Oral, BID, Din, Bufford Spikes, MD, 100 mg at 08/13/20 6295   ?  vancomycin (VANCOCIN) 750 mg in  0.9% sodium chloride 250 mL (VIAL-MATE), 750 mg, IntraVENous, Q24H, Din, Bufford Spikes, MD, Stopped at 08/13/20 0127   ________________________________________________________________________   Care Plan discussed with:           Comments         Patient  y           Family              RN  y       Research scientist (physical sciences)                              Multidiciplinary team rounds were held today with case manager, nursing, pharmacist and Higher education careers adviser.  Patient's plan of care was discussed;  medications were reviewed and discharge planning was addressed.       ________________________________________________________________________   Total NON critical care TIME:  35    Minutes      Total CRITICAL CARE TIME Spent:   Minutes non procedure based              Comments         >50% of visit spent in counseling and coordination of care         ________________________________________________________________________   Vena Austria, MD       Procedures: see electronic medical records for all procedures/Xrays and details which were not copied into this note but were reviewed prior to creation of  Plan.        LABS:   I reviewed today's most current labs and imaging studies.   Pertinent labs include:     Recent Labs            08/13/20   0040  08/11/20   0514     WBC  3.9*  4.6     HGB  10.0*  10.6*     HCT  30.1*  31.1*         PLT  225  224          Recent Labs             08/13/20   0040  08/12/20   0240  08/11/20   0514     NA  142  141  139     K  4.5  4.2  4.0     CL  115*  113*  111*     CO2  22  22  22      GLU  79  82  82     BUN  20  20  21*     CREA  1.35*  1.44*  1.77*          CA  8.7  8.4*  8.7           Signed: , MD

## 2020-08-14 LAB — METABOLIC PANEL, BASIC
Anion gap: 4 mmol/L — ABNORMAL LOW (ref 5–15)
BUN/Creatinine ratio: 10 — ABNORMAL LOW (ref 12–20)
BUN: 14 MG/DL (ref 6–20)
CO2: 24 mmol/L (ref 21–32)
Calcium: 9 MG/DL (ref 8.5–10.1)
Chloride: 113 mmol/L — ABNORMAL HIGH (ref 97–108)
Creatinine: 1.47 MG/DL — ABNORMAL HIGH (ref 0.70–1.30)
GFR est AA: 55 mL/min/{1.73_m2} — ABNORMAL LOW (ref >60)
GFR est non-AA: 46 mL/min/{1.73_m2} — ABNORMAL LOW (ref >60)
Glucose: 98 mg/dL (ref 65–100)
Potassium: 4.3 mmol/L (ref 3.5–5.1)
Sodium: 141 mmol/L (ref 136–145)

## 2020-08-14 LAB — CBC W/O DIFF
ABSOLUTE NRBC: 0 10*3/uL (ref 0.00–0.01)
HCT: 36.1 % — ABNORMAL LOW (ref 36.6–50.3)
HGB: 12.1 g/dL (ref 12.1–17.0)
MCH: 28.5 PG (ref 26.0–34.0)
MCHC: 33.5 g/dL (ref 30.0–36.5)
MCV: 85.1 FL (ref 80.0–99.0)
MPV: 10.9 FL (ref 8.9–12.9)
NRBC: 0 PER 100 WBC
PLATELET: 233 10*3/uL (ref 150–400)
RBC: 4.24 M/uL (ref 4.10–5.70)
RDW: 15.3 % — ABNORMAL HIGH (ref 11.5–14.5)
WBC: 3.4 10*3/uL — ABNORMAL LOW (ref 4.1–11.1)

## 2020-08-14 LAB — GLUCOSE, POC
Glucose (POC): 95 mg/dL (ref 65–117)
Glucose (POC): 122 mg/dL — ABNORMAL HIGH (ref 65–117)
Glucose (POC): 163 mg/dL — ABNORMAL HIGH (ref 65–117)

## 2020-08-14 LAB — VANCOMYCIN, TROUGH: Vancomycin,trough: 24.4 ug/mL — CR (ref 5.0–10.0)

## 2020-08-14 LAB — BASIC METABOLIC PANEL
Anion Gap: 4 mmol/L — ABNORMAL LOW (ref 5–15)
BUN: 14 MG/DL (ref 6–20)
Bun/Cre Ratio: 10 — ABNORMAL LOW (ref 12–20)
CO2: 24 mmol/L (ref 21–32)
Calcium: 9 MG/DL (ref 8.5–10.1)
Chloride: 113 mmol/L — ABNORMAL HIGH (ref 97–108)
Creatinine: 1.47 MG/DL — ABNORMAL HIGH (ref 0.70–1.30)
EGFR IF NonAfrican American: 46 mL/min/{1.73_m2} — ABNORMAL LOW (ref >60)
GFR African American: 55 mL/min/{1.73_m2} — ABNORMAL LOW (ref >60)
Glucose: 98 mg/dL (ref 65–100)
Potassium: 4.3 mmol/L (ref 3.5–5.1)
Sodium: 141 mmol/L (ref 136–145)

## 2020-08-14 LAB — POCT GLUCOSE
POC Glucose: 95 mg/dL (ref 65–117)
POC Glucose: 122 mg/dL — ABNORMAL HIGH (ref 65–117)
POC Glucose: 163 mg/dL — ABNORMAL HIGH (ref 65–117)

## 2020-08-14 LAB — CBC
Hematocrit: 36.1 % — ABNORMAL LOW (ref 36.6–50.3)
Hemoglobin: 12.1 g/dL (ref 12.1–17.0)
MCH: 28.5 PG (ref 26.0–34.0)
MCHC: 33.5 g/dL (ref 30.0–36.5)
MCV: 85.1 FL (ref 80.0–99.0)
MPV: 10.9 FL (ref 8.9–12.9)
NRBC Absolute: 0 10*3/uL (ref 0.00–0.01)
Nucleated RBCs: 0 PER 100 WBC
Platelets: 233 10*3/uL (ref 150–400)
RBC: 4.24 M/uL (ref 4.10–5.70)
RDW: 15.3 % — ABNORMAL HIGH (ref 11.5–14.5)
WBC: 3.4 10*3/uL — ABNORMAL LOW (ref 4.1–11.1)

## 2020-08-14 LAB — VANCOMYCIN TROUGH: Vancomycin Tr: 24.4 ug/mL (ref 5.0–10.0)

## 2020-08-14 MED ORDER — VANCOMYCIN 1,000 MG IV SOLR
1000 mg | INTRAVENOUS | Status: DC
Start: 2020-08-14 — End: 2020-08-15
  Administered 2020-08-15: 04:00:00 via INTRAVENOUS

## 2020-08-14 MED FILL — GABAPENTIN 100 MG CAP: 100 mg | ORAL | Qty: 1

## 2020-08-14 MED FILL — ZOSYN 3.375 GRAM INTRAVENOUS SOLUTION: 3.375 gram | INTRAVENOUS | Qty: 3.38

## 2020-08-14 MED FILL — INSULIN LISPRO 100 UNIT/ML INJECTION: 100 unit/mL | SUBCUTANEOUS | Qty: 1

## 2020-08-14 MED FILL — TRAMADOL 50 MG TAB: 50 mg | ORAL | Qty: 1

## 2020-08-14 MED FILL — MAPAP (ACETAMINOPHEN) 325 MG TABLET: 325 mg | ORAL | Qty: 2

## 2020-08-14 MED FILL — COLCHICINE 0.6 MG TAB: 0.6 mg | ORAL | Qty: 1

## 2020-08-14 MED FILL — ENOXAPARIN 40 MG/0.4 ML SUB-Q SYRINGE: 40 mg/0.4 mL | SUBCUTANEOUS | Qty: 0.4

## 2020-08-14 MED FILL — ALLOPURINOL 100 MG TAB: 100 mg | ORAL | Qty: 1

## 2020-08-14 MED FILL — VANCOMYCIN 750 MG IV SOLUTION: 750 mg | INTRAVENOUS | Qty: 750

## 2020-08-14 MED FILL — PANTOPRAZOLE 40 MG TAB, DELAYED RELEASE: 40 mg | ORAL | Qty: 1

## 2020-08-14 MED FILL — ATORVASTATIN 40 MG TAB: 40 mg | ORAL | Qty: 1

## 2020-08-14 MED FILL — ASPIRIN 81 MG TAB, DELAYED RELEASE: 81 mg | ORAL | Qty: 1

## 2020-08-14 NOTE — Progress Notes (Signed)
End of Shift Note    Bedside shift change report given to [employer] (Cabin crew) by Nolon Rod, RN (offgoing nurse).  Report included the following information SBAR, Kardex, Procedure Summary, Intake/Output, MAR, Accordion and Recent Results    Shift worked:  7a-7p     Shift summary and any significant changes:     Pt refused BG check this afternoon., otherwise cooperative. IV abx as ordered, no pain meds required. Drsg chg to right foot as ordered. Pt voiding, PVR=208.     Concerns for physician to address: none     Zone phone for oncoming shift:   [phone]       Activity:  Activity Level: Up with Assistance  Number times ambulated in hallways past shift: 0  Number of times OOB to chair past shift: 2    Cardiac:   Cardiac Monitoring: No           Access:   Current line(s): PIV     Genitourinary:   Urinary status: voiding    Respiratory:   O2 Device: None (Room air)  Chronic home O2 use?: NO  Incentive spirometer at bedside: NO       GI:  Last Bowel Movement Date: 08/12/20  Current diet:  ADULT DIET Regular; 3 carb choices (45 gm/meal)  Passing flatus: YES  Tolerating current diet: YES       Pain Management:   Patient states pain is manageable on current regimen: N/A    Skin:  Braden Score: 19  Interventions: increase time out of bed    Patient Safety:  Fall Score: Total Score: 4  Interventions: gripper socks  High Fall Risk: Yes    Length of Stay:  Expected LOS: 5d 7h  Actual LOS: 5      Nolon Rod, RN

## 2020-08-14 NOTE — Progress Notes (Signed)
MRI being cancelled due to no information available on patient's pacemaker. Once pt. can produce the info please put MRI order back in. TY, MRI

## 2020-08-14 NOTE — Progress Notes (Signed)
Progress  Notes by Vena Austria, MD at 08/14/20 1347                Author: Vena Austria, MD  Service: Internal Medicine  Author Type: Physician       Filed: 08/14/20 1349  Date of Service: 08/14/20 1347  Status: Signed          Editor: Vena Austria, MD (Physician)                       Hospitalist Progress Note      NAME: Michael Lozano    DOB:  [birth year]    MRN:  [id number]          Assessment / Plan:     Rt foot non healing Diabetic ulcer    Peripheral neuropathy   -Duplex US neg, xr R foot neg    -MRI cannot be done due to AICD and patient's cardiologist is based in [place]   -CT shows plantar ulceration with no CT evidence of osteomyelitis   -S/p bedside I&D on 6/29   -Wound cultures are growing gram-negative rods and diphtheroids   -Continue vancomycin and Zosyn for now and de-escalate antibiotics in a.m. tomorrow and possibly discharge in a.m. tomorrow for outpatient follow-up with PCP      Right leg PAD   -Vascular surgery following and will coordinate with podiatry regarding next steps.  Waiting on final recommendations from vascular surgery.         Left knee swelling and redness likely gouty arthritis   -Continue colchicine and allopurinol   -Avoid nonsteroidals due to CKD   -Ortho recommendations noted      CKD3  With Acute kidney injury  Cr today 1.9   Cr  Is 1.5 at baseline.  Holding lisinopril.   ??   Hx of DVT not on AC, US doppler negative for DVT   Hx of GERD   HLD??   HTN   Hx Gout   Hx Neuropathy??   -Cont home asa, Lipitor, lisinopril, PPI   -Cont home gabapentin    ??         Code Status:????Full   Surrogate Decision Maker:   DVT Prophylaxis:??lovenox SQ   GI Prophylaxis: not indicated   Baseline:          Body mass index is 23.35 kg/m??.      EDD: 7/1 likely home with daughter   Barriers: PT OT eval, wound cultures          Subjective:        Chief Complaint / Reason for Physician Visit   Reports that pain is adequately controlled.  No fever or diarrhea   Had bedside I&D yesterday          Review of Systems:           Symptom  Y/N  Comments    Symptom  Y/N  Comments             Fever/Chills        Chest Pain                 Poor Appetite        Edema         Cough        Abdominal Pain                 Sputum        Joint Pain  SOB/DOE        Pruritis/Rash         Nausea/vomit        Tolerating PT/OT         Diarrhea        Tolerating Diet         Constipation        Other               Could NOT obtain due to:            Objective:        VITALS:    Last 24hrs VS reviewed since prior progress note. Most recent are:   Patient Vitals for the past 24 hrs:            Temp  Pulse  Resp  BP  SpO2            08/14/20 1105  97.8 ??F (36.6 ??C)  63  18  (!) 127/43  98 %            08/14/20 0840  97.5 ??F (36.4 ??C)  65  18  (!) 118/59  98 %     08/13/20 1558  97.4 ??F (36.3 ??C)  61  18  (!) 118/55  98 %            08/13/20 1554  97.6 ??F (36.4 ??C)  61  18  (!) 114/57  100 %           Intake/Output Summary (Last 24 hours) at 08/14/2020 1347   Last data filed at 08/14/2020 0851     Gross per 24 hour        Intake  200 ml        Output  1650 ml        Net  -1450 ml            PHYSICAL EXAM:   General: Alert, cooperative, no acute distress????   EENT:  EOMI. Anicteric sclerae. MMM   Resp:  CTA bilaterally, no wheezing or rales.  No accessory muscle use   CV:  Regular  rhythm,?? No edema   GI:  Soft, Non distended, Non tender. ??+Bowel sounds   Neurologic:?? Alert and awake, normal speech,    Psych:???? Not anxious nor agitated   Skin:  Rt foot dressing +, left knee tenderness improving      Reviewed most current lab test results and cultures  YES   Reviewed most current radiology test results   YES   Review and summation of old records today    NO   Reviewed patient's current orders and MAR    YES   PMH/SH reviewed - no change compared  to H&P               Current Facility-Administered Medications:    ?  [START ON 08/15/2020] vancomycin (VANCOCIN) 1,000 mg in 0.9% sodium chloride 250 mL (VIAL-MATE), 1,000 mg,  IntraVENous, Q24H, Alliana Mcauliff K, MD   ?  colchicine tablet 0.6 mg, 0.6 mg, Oral, DAILY, Mahogani Holohan K, MD, 0.6 mg at 08/14/20 [id number]   ?  allopurinoL (ZYLOPRIM) tablet 100 mg, 100 mg, Oral, DAILY, Vena AustriaGolla, Kellsie Grindle K, MD, 100 mg at 08/14/20 0834   ?  insulin lispro (HUMALOG) injection, , SubCUTAneous, AC&HS, Vena AustriaGolla, Ramya Vanbergen K, MD, 2 Units at 08/12/20 1305   ?  glucose chewable tablet 16 g, 4 Tablet, Oral, PRN, Darlina RumpfGolla, Carlynn HeraldVarun K, MD   ?  glucagon (  GLUCAGEN) injection 1 mg, 1 mg, IntraMUSCular, PRN, Trev Boley K, MD   ?  dextrose 10% infusion 0-250 mL, 0-250 mL, IntraVENous, PRN, Darlina Rumpf, Carlynn Herald, MD   ?  traMADoL (ULTRAM) tablet 50 mg, 50 mg, Oral, Q6H PRN, Vena Austria, MD, 50 mg at 08/14/20 0159   ?  aspirin delayed-release tablet 81 mg, 81 mg, Oral, DAILY, Din, Bufford Spikes, MD, 81 mg at 08/14/20 0981   ?  atorvastatin (LIPITOR) tablet 40 mg, 40 mg, Oral, DAILY, Din, Bufford Spikes, MD, 40 mg at 08/14/20 1914   ?  [Held by provider] lisinopriL (PRINIVIL, ZESTRIL) tablet 2.5 mg, 2.5 mg, Oral, DAILY, Din, Nizam U, MD, 2.5 mg at 08/10/20 1038   ?  sodium chloride (NS) flush 5-40 mL, 5-40 mL, IntraVENous, Q8H, Din, Nizam U, MD, 10 mL at 08/13/20 2200   ?  sodium chloride (NS) flush 5-40 mL, 5-40 mL, IntraVENous, PRN, Din, Bufford Spikes, MD, 10 mL at 08/12/20 1717   ?  acetaminophen (TYLENOL) tablet 650 mg, 650 mg, Oral, Q6H PRN, 650 mg at 08/14/20 0159 **OR** acetaminophen (TYLENOL) suppository 650 mg, 650 mg, Rectal, Q6H PRN, Din, Nizam U, MD   ?  polyethylene glycol (MIRALAX) packet 17 g, 17 g, Oral, DAILY PRN, Din, Nizam U, MD   ?  ondansetron (ZOFRAN ODT) tablet 4 mg, 4 mg, Oral, Q8H PRN **OR** ondansetron (ZOFRAN) injection 4 mg, 4 mg, IntraVENous, Q6H PRN, Din, Nizam U, MD   ?  enoxaparin (LOVENOX) injection 40 mg, 40 mg, SubCUTAneous, DAILY, Din, Bufford Spikes, MD, 40 mg at 08/14/20 7829   ?  piperacillin-tazobactam (ZOSYN) 3.375 g in 0.9% sodium chloride (MBP/ADV) 100 mL MBP, 3.375 g, IntraVENous, Q8H, Din, Nizam U, MD, Last Rate: 25 mL/hr  at 08/14/20 0834, 3.375 g at 08/14/20 0834   ?  pantoprazole (PROTONIX) tablet 40 mg, 40 mg, Oral, ACB, Din, Bufford Spikes, MD, 40 mg at 08/14/20 5621   ?  gabapentin (NEURONTIN) capsule 100 mg, 100 mg, Oral, BID, Din, Bufford Spikes, MD, 100 mg at 08/14/20 3086   ________________________________________________________________________   Care Plan discussed with:           Comments         Patient  y           Family              RN  y       Care Manager             Consultant                                  Multidiciplinary team rounds were held today with case manager, nursing, pharmacist and Higher education careers adviser.  Patient's plan of care was discussed;  medications were reviewed and discharge planning was addressed.       ________________________________________________________________________   Total NON critical care TIME:  35    Minutes      Total CRITICAL CARE TIME Spent:   Minutes non procedure based              Comments         >50% of visit spent in counseling and coordination of care         ________________________________________________________________________   Vena Austria, MD       Procedures: see electronic medical records for all procedures/Xrays and details which were not copied into  this note but were reviewed prior to creation of Plan.        LABS:   I reviewed today's most current labs and imaging studies.   Pertinent labs include:     Recent Labs            08/14/20   0215  08/13/20   0040     WBC  3.4*  3.9*     HGB  12.1  10.0*     HCT  36.1*  30.1*         PLT  233  225          Recent Labs             08/14/20   0215  08/13/20   0040  08/12/20   0240     NA  141  142  141     K  4.3  4.5  4.2     CL  113*  115*  113*     CO2  24  22  22      GLU  98  79  82     BUN  14  20  20      CREA  1.47*  1.35*  1.44*          CA  9.0  8.7  8.4*           Signed: , MD

## 2020-08-14 NOTE — Progress Notes (Signed)
Problem: Risk for Spread of Infection  Goal: Prevent transmission of infectious organism to others  Description: Prevent the transmission of infectious organisms to other patients, staff members, and visitors.  Outcome: Progressing Towards Goal     Problem: Falls - Risk of  Goal: *Absence of Falls  Description: Document Michael Lozano Fall Risk and appropriate interventions in the flowsheet.  Outcome: Progressing Towards Goal  Note: Fall Risk Interventions:  Mobility Interventions: Utilize walker, cane, or other assistive device,Communicate number of staff needed for ambulation/transfer    Mentation Interventions: Adequate sleep, hydration, pain control,Evaluate medications/consider consulting pharmacy    Medication Interventions: Teach patient to arise slowly,Patient to call before getting OOB    Elimination Interventions: Call light in reach

## 2020-08-14 NOTE — Progress Notes (Signed)
 Pharmacy Antimicrobial Kinetic Dosing    Indication for Antimicrobials: diabetic foot infection    Current Regimen of Each Antimicrobial:  Zosyn 3.375 gm IV q8h (Start Date 6/25; Day # 6/8)  Vancomycin 2000 mg IV once, then 750 mg IV q24h (Start Date 6/25; Day # 6/8)    Previous Antimicrobial Therapy:  Unasyn 3 gm IV once on 6/25    Goal Level: AUC: 400-600 mg/hr/Liter/day    Date Dose & Interval Measured (mcg/mL) Predicted AUC/MIC   6/27 21:36 750 mg q24h 13.9 441   6/30 0215 750 mg q24h 24.4 397           Date & time of next level: pending    Dosing calculator used: [employer] calculator    Significant Positive Cultures:   Ct not suggestive of osteomyelitis    Conditions for Dosing Consideration: None    Labs:  Recent Labs     08/14/20  0215 08/13/20  0040 08/12/20  0240   CREA 1.47* 1.35* 1.44*   BUN 14 20 20      Recent Labs     08/14/20  0215 08/13/20  0040   WBC 3.4* 3.9*     Temp (24hrs), Avg:97.5 F (36.4 C), Min:97.4 F (36.3 C), Max:97.6 F (36.4 C)    Creatinine Clearance (mL/min):   CrCl (Ideal Body Weight): 41.1    Impression/Plan:   The vancomycin level resulted, AUC 397 (subtherapeutic, level drawn after dose). Will increase regimen to 1000 mg Q24H, expected AUC 518  Continue with same dose of zosyn, scr improved   Antimicrobial stop date 7/2     Pharmacy will follow daily and adjust medications as appropriate for renal function and/or serum levels.    Thank you,  Camellia MARLA Bees, PHARMD

## 2020-08-14 NOTE — Progress Notes (Signed)
MRI STILL PENDING until pt's ICD information card and info are available. Once available please send to MRI ASAP.[name]

## 2020-08-15 LAB — ANKLE BRACHIAL INDEX
Left ABI: 1.16
Left TBI: 0.82
Left arm BP: 130 mmHg
Left dorsalis pedis BP: 148 mmHg
Left posterior tibial: 152 mmHg
Left toe pressure: 107 mmHg
Right TBI: 0.28
Right arm BP: 131 mmHg
Right dorsalis pedis BP: >240 mmHg — AB
Right posterior tibial: >240 mmHg — AB
Right toe pressure: 37 mmHg

## 2020-08-15 LAB — CBC W/O DIFF
ABSOLUTE NRBC: 0 10*3/uL (ref 0.00–0.01)
HCT: 29.7 % — ABNORMAL LOW (ref 36.6–50.3)
HGB: 10 g/dL — ABNORMAL LOW (ref 12.1–17.0)
MCH: 28.6 PG (ref 26.0–34.0)
MCHC: 33.7 g/dL (ref 30.0–36.5)
MCV: 84.9 FL (ref 80.0–99.0)
MPV: 11.4 FL (ref 8.9–12.9)
NRBC: 0 PER 100 WBC
PLATELET: 218 10*3/uL (ref 150–400)
RBC: 3.5 M/uL — ABNORMAL LOW (ref 4.10–5.70)
RDW: 15.3 % — ABNORMAL HIGH (ref 11.5–14.5)
WBC: 3.4 10*3/uL — ABNORMAL LOW (ref 4.1–11.1)

## 2020-08-15 LAB — CULTURE, WOUND W GRAM STAIN

## 2020-08-15 LAB — METABOLIC PANEL, BASIC
Anion gap: 6 mmol/L (ref 5–15)
BUN/Creatinine ratio: 12 (ref 12–20)
BUN: 16 MG/DL (ref 6–20)
CO2: 23 mmol/L (ref 21–32)
Calcium: 8.8 MG/DL (ref 8.5–10.1)
Chloride: 113 mmol/L — ABNORMAL HIGH (ref 97–108)
Creatinine: 1.31 MG/DL — ABNORMAL HIGH (ref 0.70–1.30)
GFR est AA: >60 mL/min/{1.73_m2} (ref >60)
GFR est non-AA: 52 mL/min/{1.73_m2} — ABNORMAL LOW (ref >60)
Glucose: 100 mg/dL (ref 65–100)
Potassium: 3.8 mmol/L (ref 3.5–5.1)
Sodium: 142 mmol/L (ref 136–145)

## 2020-08-15 LAB — GLUCOSE, POC: Glucose (POC): 105 mg/dL (ref 65–117)

## 2020-08-15 LAB — BASIC METABOLIC PANEL
Anion Gap: 6 mmol/L (ref 5–15)
BUN: 16 MG/DL (ref 6–20)
Bun/Cre Ratio: 12 (ref 12–20)
CO2: 23 mmol/L (ref 21–32)
Calcium: 8.8 MG/DL (ref 8.5–10.1)
Chloride: 113 mmol/L — ABNORMAL HIGH (ref 97–108)
Creatinine: 1.31 MG/DL — ABNORMAL HIGH (ref 0.70–1.30)
EGFR IF NonAfrican American: 52 mL/min/{1.73_m2} — ABNORMAL LOW (ref >60)
GFR African American: >60 mL/min/{1.73_m2} (ref >60)
Glucose: 100 mg/dL (ref 65–100)
Potassium: 3.8 mmol/L (ref 3.5–5.1)
Sodium: 142 mmol/L (ref 136–145)

## 2020-08-15 LAB — VAS ANKLE BRACHIAL INDEX (ABI)
Left ABI: 1.16
Left TBI: 0.82
Left arm BP: 130 mmHg
Left dorsalis pedis BP: 148 mmHg
Left posterior tibial: 152 mmHg
Left toe pressure: 107 mmHg
Right TBI: 0.28
Right arm BP: 131 mmHg
Right dorsalis pedis BP: >240 mmHg — AB
Right posterior tibial: >240 mmHg — AB
Right toe pressure: 37 mmHg

## 2020-08-15 LAB — POCT GLUCOSE: POC Glucose: 105 mg/dL (ref 65–117)

## 2020-08-15 LAB — CBC
Hematocrit: 29.7 % — ABNORMAL LOW (ref 36.6–50.3)
Hemoglobin: 10 g/dL — ABNORMAL LOW (ref 12.1–17.0)
MCH: 28.6 PG (ref 26.0–34.0)
MCHC: 33.7 g/dL (ref 30.0–36.5)
MCV: 84.9 FL (ref 80.0–99.0)
MPV: 11.4 FL (ref 8.9–12.9)
NRBC Absolute: 0 10*3/uL (ref 0.00–0.01)
Nucleated RBCs: 0 PER 100 WBC
Platelets: 218 10*3/uL (ref 150–400)
RBC: 3.5 M/uL — ABNORMAL LOW (ref 4.10–5.70)
RDW: 15.3 % — ABNORMAL HIGH (ref 11.5–14.5)
WBC: 3.4 10*3/uL — ABNORMAL LOW (ref 4.1–11.1)

## 2020-08-15 MED ORDER — LISINOPRIL 10 MG TAB
10 mg | ORAL_TABLET | Freq: Every day | ORAL | 0 refills | Status: AC
Start: 2020-08-15 — End: 2020-09-14

## 2020-08-15 MED ORDER — AMOXICILLIN CLAVULANATE 875 MG-125 MG TAB
875-125 mg | ORAL_TABLET | Freq: Two times a day (BID) | ORAL | 0 refills | Status: AC
Start: 2020-08-15 — End: 2020-08-25

## 2020-08-15 MED ORDER — GABAPENTIN 100 MG CAP
100 mg | Freq: Once | ORAL | Status: AC
Start: 2020-08-15 — End: 2020-08-14
  Administered 2020-08-15: 03:00:00 via ORAL

## 2020-08-15 MED ORDER — COLCHICINE 0.6 MG TAB
0.6 mg | ORAL_TABLET | Freq: Every day | ORAL | 0 refills | Status: AC
Start: 2020-08-15 — End: 2020-09-15

## 2020-08-15 MED ORDER — ALLOPURINOL 100 MG TAB
100 mg | ORAL_TABLET | Freq: Every day | ORAL | 0 refills | Status: AC
Start: 2020-08-15 — End: 2020-09-15

## 2020-08-15 MED FILL — ALLOPURINOL 100 MG TAB: 100 mg | ORAL | Qty: 1

## 2020-08-15 MED FILL — ZOSYN 3.375 GRAM INTRAVENOUS SOLUTION: 3.375 gram | INTRAVENOUS | Qty: 3.38

## 2020-08-15 MED FILL — COLCHICINE 0.6 MG TAB: 0.6 mg | ORAL | Qty: 1

## 2020-08-15 MED FILL — GABAPENTIN 100 MG CAP: 100 mg | ORAL | Qty: 1

## 2020-08-15 MED FILL — ENOXAPARIN 40 MG/0.4 ML SUB-Q SYRINGE: 40 mg/0.4 mL | SUBCUTANEOUS | Qty: 0.4

## 2020-08-15 MED FILL — VANCOMYCIN 1,000 MG IV SOLR: 1000 mg | INTRAVENOUS | Qty: 1000

## 2020-08-15 MED FILL — ATORVASTATIN 40 MG TAB: 40 mg | ORAL | Qty: 1

## 2020-08-15 MED FILL — ASPIRIN 81 MG TAB, DELAYED RELEASE: 81 mg | ORAL | Qty: 1

## 2020-08-15 MED FILL — PANTOPRAZOLE 40 MG TAB, DELAYED RELEASE: 40 mg | ORAL | Qty: 1

## 2020-08-15 MED FILL — MAPAP (ACETAMINOPHEN) 325 MG TABLET: 325 mg | ORAL | Qty: 2

## 2020-08-15 NOTE — Progress Notes (Signed)
Spiritual Care Assessment/Progress Note  [facility]      NAME: Michael Lozano      MRN: [id number]  AGE: 86 y.o. SEX: male  Religious Affiliation: Christian   Language: English     08/15/2020     Total Time (in minutes): 13     Spiritual Assessment begun in [facility] 3 SURG TELE through conversation with:         [x] Patient        []  Family    []  Friend(s)        Reason for Consult: Initial/Spiritual assessment, patient floor     Spiritual beliefs: (Please include comment if needed)     []  Identifies with a faith tradition:         []  Supported by a faith community:            []  Claims no spiritual orientation:           []  Seeking spiritual identity:                []  Adheres to an individual form of spirituality:           [x]  Not able to assess:                           Identified resources for coping:      []  Prayer                               []  Music                  []  Guided Imagery     []  Family/friends                 []  Pet visits     []  Devotional reading                         [x]  Unknown     []  Other:                                            Interventions offered during this visit: (See comments for more details)    Patient Interventions: Initial/Spiritual assessment, Critical care,Initial visit           Plan of Care:     []  Support spiritual and/or cultural needs    []  Support AMD and/or advance care planning process      []  Support grieving process   []  Coordinate Rites and/or Rituals    []  Coordination with community clergy   []  No spiritual needs identified at this time   []  Detailed Plan of Care below (See Comments)  []  Make referral to Music Therapy  []  Make referral to Pet Therapy     []  Make referral to Addiction services  []  Make referral to [facility]  []  Make referral to Spiritual Care Partner  []  No future visits requested        [x]  Contact Spiritual Care for further referrals     Comments:Chaplain reviewed the patient's chart prior to the visit. Chaplain  coordinated services with (nurse). Mr. Bilton was on the phone when the Chaplain attempted to enter his room. Chaplain waited however his doctor entered  to provide care. Chaplain advised the nurse of the availability of pastoral care services 24 hours a day as requested.     Chaplain Rev. Willaim Sheng, D. Chiropractor Service [phone])

## 2020-08-15 NOTE — Progress Notes (Signed)
Attempted to schedule hospital follow up PCP appointment. Unable to schedule PCP appt due patient chart being inactive with PCP. PCP staff stated patient needs to call [phone] to reactivate chart before scheduling appt.  Pending patient discharge. Darrelyn Hillock, Care Management Assistant

## 2020-08-15 NOTE — Progress Notes (Signed)
Problem: Patient Education:  Go to Education Activity  Goal: Patient/Family Education  Outcome: Resolved/Met     Problem: Falls - Risk of  Goal: *Absence of Falls  Description: Document Schmid Fall Risk and appropriate interventions in the flowsheet.  Outcome: Resolved/Met     Problem: Risk for Spread of Infection  Goal: Prevent transmission of infectious organism to others  Description: Prevent the transmission of infectious organisms to other patients, staff members, and visitors.  Outcome: Resolved/Met     Problem: Patient Education: Go to Patient Education Activity  Goal: Patient/Family Education  Outcome: Resolved/Met     Problem: Pressure Injury - Risk of  Goal: *Prevention of pressure injury  Description: Document Braden Scale and appropriate interventions in the flowsheet.  Outcome: Resolved/Met     Problem: Patient Education: Go to Patient Education Activity  Goal: Patient/Family Education  Outcome: Resolved/Met

## 2020-08-15 NOTE — Discharge Summary (Signed)
Discharge Summary by Shelda PalKometa, Ashea Winiarski T, MD at 08/15/20 1046                Author: Shelda PalKometa, Raynie Steinhaus T, MD  Service: Internal Medicine  Author Type: Physician       Filed: 08/15/20 1050  Date of Service: 08/15/20 1046  Status: Signed          Editor: Shelda PalKometa, Daylen Hack T, MD (Physician)                                              Physician Discharge Summary        Patient ID:     Michael Lozano   [id number]   86 y.o.   07/25/1934      Admit date: 08/09/2020      Discharge date : 08/15/2020      Chronic Diagnoses:        Problem List  as of 08/15/2020  Date Reviewed:  01/30/2020                        Codes  Class  Noted - Resolved             DM (diabetes mellitus) (HCC)  ICD-10-CM: E11.9   ICD-9-CM: 250.00    08/09/2020 - Present                       CKD (chronic kidney disease)  ICD-10-CM: N18.9   ICD-9-CM: 585.9    08/09/2020 - Present                       Diabetic foot ulcer (HCC)  ICD-10-CM: E11.621, L97.509   ICD-9-CM: 250.80, 707.15    08/09/2020 - Present                       Noncompliance  ICD-10-CM: Z91.19   ICD-9-CM: V15.81    08/09/2020 - Present                       Diabetic foot infection (HCC)  ICD-10-CM: E11.628, L08.9   ICD-9-CM: 250.80, 686.9    08/09/2020 - Present                       Foot ulcer (HCC)  ICD-10-CM: L97.509   ICD-9-CM: 707.15    01/28/2020 - Present                       Orthostatic hypotension  ICD-10-CM: I95.1   ICD-9-CM: 458.0    10/02/2019 - Present                       Right foot ulcer (HCC)  ICD-10-CM: L97.519   ICD-9-CM: 707.15    10/02/2019 - Present                       Tinea unguium  ICD-10-CM: B35.1   ICD-9-CM: 110.1    06/23/2019 - Present                       Type 2 diabetes mellitus with foot ulcer (HCC)  ICD-10-CM: E11.621, L97.509   ICD-9-CM: 250.80, 707.15    06/23/2019 - Present  Overview Signed 08/09/2020 10:02 PM by Gordy Savers, MD            Last Assessment & Plan:    Formatting of this note might be different from the original.   Likely affecting neuropathy    Will return in 3 months for DM A1c follow-up   Not focus of visit today                                   Dysphagia, oral phase  ICD-10-CM: R13.11   ICD-9-CM: 787.21    06/19/2019 - Present                       Cognitive communication deficit  ICD-10-CM: R41.841   ICD-9-CM: 799.52    06/18/2019 - Present                       COVID-19  ICD-10-CM: U07.1   ICD-9-CM: 079.89    06/18/2019 - Present                       Gout, unspecified  ICD-10-CM: M10.9   ICD-9-CM: 274.9    06/18/2019 - Present                       Hyperosmolality and hypernatremia  ICD-10-CM: E87.0   ICD-9-CM: 276.0    06/18/2019 - Present                       Muscle wasting and atrophy, not elsewhere classified, multiple sites  ICD-10-CM: M62.59   ICD-9-CM: 728.2    06/18/2019 - Present                       Muscle weakness (generalized)  ICD-10-CM: M62.81   ICD-9-CM: 728.87    06/18/2019 - Present                       Pain, unspecified  ICD-10-CM: R52   ICD-9-CM: 780.96    06/18/2019 - Present                       Presence of automatic (implantable) cardiac defibrillator  ICD-10-CM: Z95.810   ICD-9-CM: V45.02    06/18/2019 - Present                       Hypernatremia  ICD-10-CM: E87.0   ICD-9-CM: 276.0    06/02/2019 - Present                       AKI (acute kidney injury) (HCC)  ICD-10-CM: N17.9   ICD-9-CM: 584.9    06/02/2019 - Present                       GI bleed  ICD-10-CM: K92.2   ICD-9-CM: 578.9    06/02/2019 - Present                       Elevated troponin  ICD-10-CM: R77.8   ICD-9-CM: 790.6    12/11/2017 - Present                       Shortness of breath  ICD-10-CM: R06.02  ICD-9-CM: 786.05    12/10/2017 - Present                       Right knee pain  ICD-10-CM: M25.561   ICD-9-CM: 719.46    06/02/2017 - Present                       Post-traumatic osteoarthritis of multiple joints  ICD-10-CM: M15.3   ICD-9-CM: 715.89    04/28/2017 - Present          Overview Signed 08/09/2020 10:02 PM by Gordy Savers, MD            Last Assessment & Plan:     Formatting of this note might be different from the original.   Secondary etiology for chronic pain   May continue on NSAIDs for now and Gabapentin   Will need to review rest of chart and determine further management in future, may need referral to Pain Management                                   Abnormality of plasma protein  ICD-10-CM: R77.9   ICD-9-CM: 273.8    03/25/2017 - Present          Overview Signed 08/09/2020 10:02 PM by Gordy Savers, MD            Last Assessment & Plan:    Formatting of this note might be different from the original.   Refer back to heme-onc                                   Elevated beta-2 microglobulin  ICD-10-CM: R79.89   ICD-9-CM: 796.4    03/25/2017 - Present                       GERD (gastroesophageal reflux disease)  ICD-10-CM: K21.9   ICD-9-CM: 530.81    03/10/2017 - Present          Overview Signed 08/09/2020 10:02 PM by Gordy Savers, MD            Last Assessment & Plan:    Formatting of this note might be different from the original.   Uncontrolled, chronic problem   Refractory to Zantac 150 BID   Likely secondary to NSAID use among other factors   DC H2 blocker   Start rx Protonix 40mg  daily - previously on Omeprazole 20 in past limited relief                                   Hyperlipidemia, unspecified  ICD-10-CM: E78.5   ICD-9-CM: 272.4    03/10/2017 - Present                       Peripheral arterial occlusive disease (HCC)  ICD-10-CM: I77.9   ICD-9-CM: 444.22    11/24/2016 - Present          Overview Signed 08/09/2020 10:02 PM by 08/11/2020, MD            Last Assessment & Plan:    Formatting of this note might be different from the original.   Likely contributing to chronic lower extremity pain /  neuropathy   Followed by Vascular                                   Bilateral lower extremity edema  ICD-10-CM: R60.0   ICD-9-CM: 782.3    10/19/2016 - Present                       Congestive heart failure (HCC)  ICD-10-CM: I50.9   ICD-9-CM: 428.0    06/14/2016 - Present           Overview Signed 08/09/2020 10:02 PM by Gordy Savers, MD            Formatting of this note might be different from the original.   LVEF 40-45% 2016 echo; Dr. Darrold Junker      Last Assessment & Plan:    Formatting of this note might be different from the original.   Stable LE edema, otherwise exam appears stable, lungs clear   Refill Lasix 20mg  daily, K supplement daily   Follow-up with Cardiology as planned                                   Hyperthyroidism  ICD-10-CM: E05.90   ICD-9-CM: 242.90    12/04/2015 - Present          Overview Signed 08/09/2020 10:02 PM by 08/11/2020, MD            Last Assessment & Plan:    Formatting of this note might be different from the original.   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   stable                                   Hypergammaglobulinemia  ICD-10-CM: D89.2   ICD-9-CM: 289.89    07/22/2015 - Present          Overview Signed 08/09/2020 10:02 PM by 08/11/2020, MD            Last Assessment & Plan:    Formatting of this note might be different from the original.   Refer to heme-onc                                   Pressure ulcer  ICD-10-CM: L89.90   ICD-9-CM: 707.00, 707.20    11/15/2014 - Present  Overview Signed 08/09/2020 10:02 PM by Gordy Savers, MD            Last Assessment & Plan:    Formatting of this note might be different from the original.   Refer to wound care center; so appreciate them seeing patient tomorrow since foot doctor cannot see him for one week; start clindamycin                                    Hyperproteinemia  ICD-10-CM: E88.09   ICD-9-CM: 273.8    11/02/2014 - Present          Overview Signed 08/09/2020 10:02 PM by Gordy Savers, MD            Last Assessment & Plan:    Formatting of this note might be different from the original.   Check labs                                   Hypoalbuminemia  ICD-10-CM: E88.09   ICD-9-CM: 273.8    11/02/2014 - Present          Overview Signed 08/09/2020 10:02 PM by Gordy Savers, MD            Last Assessment & Plan:    Formatting of this note might be different from the original.   Check albumin level; no visible lower ext edema today                                   Amputation of finger of left hand  ICD-10-CM: U31.497W   ICD-9-CM: 886.0    10/23/2014 - Present          Overview Signed 08/09/2020 10:02 PM by Gordy Savers, MD            Formatting of this note might be different from the original.   Just distal to the PIP, left 4th finger; occurred many years ago; sequela      Last Assessment & Plan:    Formatting of this note might be different from the original.   Left 4th finger, distal to PIP; sequela                                   Arthritis  ICD-10-CM: M19.90   ICD-9-CM: 716.90    10/23/2014 - Present                       Peripheral neuropathy  ICD-10-CM: G62.9   ICD-9-CM: 356.9    10/23/2014 - Present          Overview Signed 08/09/2020 10:02 PM by Gordy Savers, MD            Last Assessment & Plan:    Formatting of this note might be different from the original.   Chronic problem, presumed multifactorial secondary to prior injuries, edema, diabetes   - Somewhat improved on Gabapentin, has been taking smaller dose   - Rx refill Gabapentin 300mg  TID, inc to TID instead of BID, adjust further   - Review chart will likely need referral to Neurology / Podiatry in future as well, consider adjunct therapy for  neuropathic pain                                   Umbilical hernia without obstruction or gangrene  ICD-10-CM: K42.9   ICD-9-CM: 553.1    10/23/2014 - Present           Overview Signed 08/09/2020 10:02 PM by Gordy Savers, MD            Last Assessment & Plan:    Formatting of this note might be different from the original.   Stable, reducible                                   Artificial cardiac pacemaker  ICD-10-CM: Z95.0   ICD-9-CM: V45.01    08/10/2013 - Present          Overview Signed 08/09/2020 10:02 PM by Gordy Savers, MD            Formatting of this note might be different from the original.   Overview:    Insertion 2002      Last Assessment & Plan:    Formatting of this note might be different from the original.   Trying to get patient back in to see his cardiologist                                   H/O coronary artery bypass surgery  ICD-10-CM: Z95.1   ICD-9-CM: V45.81    08/10/2013 - Present          Overview Signed 08/09/2020 10:02 PM by Gordy Savers, MD            Formatting of this note might be different from the original.   Overview:    2002      Last Assessment & Plan:    Formatting of this note might be different from the original.   Start back on 81 mg coated aspirin daily                                   Benign prostatic hyperplasia with urinary obstruction  ICD-10-CM: N40.1, N13.8   ICD-9-CM: 600.01, 599.69    09/27/2012 - Present          Overview Signed 08/09/2020 10:02 PM by Gordy Savers, MD            Last Assessment & Plan:    Formatting of this note might be different from the original.   Continue on [place]                                   Cardiomyopathy [facility])  ICD-10-CM: I42.9   ICD-9-CM: 425.4    08/09/2011 - Present          Overview Signed 08/09/2011  7:58 PM by Carmelia Roller III, MD            6/13 echo lvef 40% with multiple regional wall motion abnormalities.   Biventricular aicd, details unknown.  CAD (coronary artery disease)  ICD-10-CM: I25.10   ICD-9-CM: 414.00    08/09/2011 - Present          Overview Signed 08/09/2011  7:58 PM by Rodena Medin, MD            Multivessel bypass, details unknown                                 22      Final Diagnoses:    Diabetic foot ulcer (HCC) [Z61.096, L97.509]   Diabetic foot infection (HCC) [E45.409, L08.9]   Noncompliance [Z91.19]   AKI (acute kidney injury) (HCC) [N17.9]   CKD (chronic kidney disease) [N18.9]   DM (diabetes mellitus) (HCC) [E11.9]      Reason for Hospitalization:   Right foot pain         Hospital Course:       Per h and p,"Martese??Persons??is a 86 y.o.??male??with PMHx significant for chronic diabetic ulcer T2DM, hx of DVT,  chronic pain,gout ,nueropathy presents to the ER for evaluation of worsening of right foot wound associatted with increase pain.Pt. is not taking any of his home medication.Pt. is not taking care of himself and does not follow his doctors.Denies Fever  chills and rigors. Denies truama to the foot.   ??   He reports chronic burning sensation in the bottoms of his feet bilaterally. ??He also reports a spot on his right foot, he reports there is no pain to the spot though he does not feel pain to his feet bilaterally. ??He states he has been diagnosed  with diabetes however he does not take any medications for diabetes. ??He denies fevers chills nausea vomiting".   He was seen and evaluated by orthopedic and vascular surgeon.  No acute need for any surgical procedure at this time.  Recommended discharge to home on oral antibiotics with outpatient follow-up.[name]  Home health has been ordered for medication reconciliation  and skilled nursing..  Stable for discharge to home.   ??                  Discharge Medications:      Current Discharge Medication List              START taking these medications          Details        allopurinoL (ZYLOPRIM) 100 mg tablet  Take 1 Tablet by mouth daily for 30 days.   Qty: 30 Tablet, Refills:  0   Start date: 08/16/2020, End date:  09/15/2020               colchicine 0.6 mg tablet  Take 1 Tablet by mouth daily for 30 days.   Qty: 30 Tablet, Refills:  0   Start date: 08/16/2020, End date:  09/15/2020                amoxicillin-clavulanate (Augmentin) 875-125 mg per tablet  Take 1 Tablet by mouth every twelve (12) hours for 10 days.   Qty: 20 Tablet, Refills:  0   Start date: 08/15/2020, End date:  08/25/2020                     CONTINUE these medications which have CHANGED          Details        lisinopriL (PRINIVIL, ZESTRIL) 10 mg tablet  Take 1 Tablet by  mouth daily for 30 days.   Qty: 30 Tablet, Refills:  0   Start date: 08/15/2020, End date:  09/14/2020                     CONTINUE these medications which have NOT CHANGED          Details        aspirin delayed-release 81 mg tablet  Take 81 mg by mouth daily.               polyethylene glycol (Miralax) 17 gram/dose powder  Take 17 g by mouth daily.   Qty: 289 g, Refills:  0               atorvastatin (LIPITOR) 40 mg tablet  Take 40 mg by mouth daily.               acetaminophen (TYLENOL) 325 mg tablet  Take 2 Tablets by mouth every four (4) hours as needed for Pain.   Qty: 20 Tablet, Refills:  0                            Follow up Care:     1. Wyvonna Plum E, NP in 1-2 weeks.  Please call to set up an appointment shortly after discharge.        Diet:  Diabetic Diet      Disposition:   Home.      Advanced Directive:       FULL          DNR          Discharge Exam:   Visit Vitals      BP  (!) 160/72     Pulse  65     Temp  98.2 ??F (36.8 ??C)     Resp  17     Ht  6' (1.829 m)     Wt  78.1 kg (172 lb 2.9 oz)     SpO2  98%        BMI  23.35 kg/m??        O2 Device:  None (Room air)      Temp (24hrs), Avg:98.2 ??F (36.8 ??C), Min:97.8 ??F (36.6 ??C), Max:98.6 ??F (37 ??C)     No intake/output data recorded.    06/29 1901 - 07/01 0700   In: 650 [I.V.:650]   Out: 1150 [Urine:1150]         General:   Alert, cooperative, no distress, appears stated age.        Lungs:    Clear to auscultation bilaterally.        Chest wall:   No tenderness or deformity.        Heart:   Regular rate and rhythm, S1, S2 normal, no murmur, click, rub or gallop.        Abdomen:    Soft, non-tender. Bowel sounds  normal. No masses,  No organomegaly.     Extremities:  Extremities normal, atraumatic, no cyanosis or edema.     Pulses:  2+ and symmetric all extremities.     Skin:  Skin color, texture, turgor normal. No rashes or lesions        Neurologic:  CNII-XII intact. No gross sensory or motor deficits              CONSULTATIONS: Vascular Surgery      Significant Diagnostic Studies:    08/09/2020: BUN  24 MG/DL (H; Ref range: 6 - 20 MG/DL); Calcium 10.2 MG/DL (H; Ref range: 8.5 - 16.1 MG/DL); CO2 24 mmol/L (Ref range: 21 - 32 mmol/L); Creatinine 1.98 MG/DL (H; Ref range: 0.96 - 0.45 MG/DL); Glucose 108 mg/dL (H; Ref range: 65 - 409 mg/dL);  HCT 81.1 % (Ref range: 36.6 - 50.3 %); HGB 12.8 g/dL (Ref range: 91.4 - 78.2 g/dL); Potassium 4.6 mmol/L (Ref range: 3.5 - 5.1 mmol/L); Sodium 133 mmol/L (L; Ref range: 136 - 145 mmol/L)   08/10/2020: BUN 22 MG/DL (H; Ref range: 6 - 20 MG/DL); Calcium 9.2 MG/DL (Ref range: 8.5 - 95.6 MG/DL); CO2 22 mmol/L (Ref range: 21 - 32 mmol/L); Creatinine 1.65 MG/DL (H; Ref range: 2.13 - 0.86 MG/DL); Glucose 102 mg/dL (H; Ref range: 65 - 578 mg/dL);  Potassium 4.1 mmol/L (Ref range: 3.5 - 5.1 mmol/L); Sodium 136 mmol/L (Ref range: 136 - 145 mmol/L)     Recent Labs            08/15/20   0238  08/14/20   0215     WBC  3.4*  3.4*     HGB  10.0*  12.1     HCT  29.7*  36.1*         PLT  218  233          Recent Labs             08/15/20   0627  08/14/20   0215  08/13/20   0040     NA  142  141  142     K  3.8  4.3  4.5     CL  113*  113*  115*     CO2  BUN  CREA  1.31*  1.47*  1.35*     GLU  100  98  79          CA  8.8  9.0  8.7        No results for input(s): ALT, AP, TBIL, TBILI, TP, ALB, GLOB, GGT, AML, LPSE in the last 72 hours.      No lab exists for component: SGOT, GPT, AMYP, HLPSE   No results for input(s): INR, PTP, APTT, INREXT in the last 72 hours.    No results for input(s): FE, TIBC, PSAT, FERR in the last 72 hours.    No results for input(s): PH, PCO2, PO2 in  the last 72 hours.   No results for input(s): CPK, CKMB in the last 72 hours.      No lab exists for component: TROPONINI     Lab Results         Component  Value  Date/Time            Glucose (POC)  105  08/15/2020 08:26 AM       Glucose (POC)  163 (H)  08/14/2020 11:41 AM       Glucose (POC)  122 (H)  08/14/2020 08:29 AM       Glucose (POC)  95  08/13/2020 09:42 PM            Glucose (POC)  118 (H)  08/13/2020 04:54 PM           Total Time: 35 minutes      Signed:   Shelda Pal, MD   08/15/2020   10:46 AM

## 2020-08-15 NOTE — Progress Notes (Signed)
End of Shift Note    Bedside shift change report given to Bridget,RN (oncoming nurse) by Oswald Hillock, RN (offgoing nurse).  Report included the following information SBAR, Kardex and MAR    Shift worked:  7PM-7AM     Shift summary and any significant changes:     08/14/20 2154 patient has been complaining of numbness and tingling in legs. prn tramdol and tylenol given. can something else be ordered for patient?  Patient is also refusing blood sugar check  patient noted with some confusion during the night. One time dose of gabapentin given for numbness and tingling to bles. Patient refused 2200 blood sugar check. md made aware. Continues on IV abt therapy.      Concerns for physician to address:      Zone phone for oncoming shift:             Oswald Hillock, RN

## 2020-08-15 NOTE — Progress Notes (Signed)
DISCHARGE NOTE FROM [facility] NURSE    Patient determined to be stable for discharge by attending provider. I have reviewed the discharge instructions and follow-up appointments with the patient. They verbalized understanding and all questions were answered to their satisfaction. No complaints or further questions were expressed.      Medications sent to pharmacy.  Appropriate educational materials and medication side effect teaching were provided.      PIV were removed prior to discharge.     Patient did not discharge with any line, foley, or drain.     All personal items collected during admission were returned to the patient prior to discharge.    Post-op patient: No    Pt getting dressed and waiting for ride.   Marcial Pacas, RN

## 2020-08-15 NOTE — Progress Notes (Signed)
Palpable DP pulse  Wound beneath 2nd MT head is shallow and clean  No vasc intervention needed  Can easliy be managed as outpt  Needs good follow up for foot wound and orthotics

## 2020-08-18 LAB — CULTURE, BLOOD, PAIRED
Culture result:: NO GROWTH
Culture: NO GROWTH

## 2021-01-19 ENCOUNTER — Emergency Department: Admit: 2021-01-19 | Payer: MEDICARE | Primary: Adult Health

## 2021-01-19 ENCOUNTER — Inpatient Hospital Stay
Admit: 2021-01-19 | Discharge: 2021-01-19 | Disposition: A | Discharge status: Home / Self Care | Payer: MEDICARE | Attending: Student in an Organized Health Care Education/Training Program

## 2021-01-19 DIAGNOSIS — G629 Polyneuropathy, unspecified: Secondary | ICD-10-CM

## 2021-01-19 LAB — METABOLIC PANEL, COMPREHENSIVE
A-G Ratio: 0.6 — ABNORMAL LOW (ref 1.1–2.2)
ALT (SGPT): 16 U/L (ref 12–78)
AST (SGOT): 26 U/L (ref 15–37)
Albumin: 3.3 g/dL — ABNORMAL LOW (ref 3.5–5.0)
Alk. phosphatase: 94 U/L (ref 45–117)
Anion gap: 8 mmol/L (ref 5–15)
BUN/Creatinine ratio: 13 (ref 12–20)
BUN: 23 MG/DL — ABNORMAL HIGH (ref 6–20)
Bilirubin, total: 0.4 MG/DL (ref 0.2–1.0)
CO2: 20 mmol/L — ABNORMAL LOW (ref 21–32)
Calcium: 9.7 MG/DL (ref 8.5–10.1)
Chloride: 108 mmol/L (ref 97–108)
Creatinine: 1.72 MG/DL — ABNORMAL HIGH (ref 0.70–1.30)
Globulin: 5.4 g/dL — ABNORMAL HIGH (ref 2.0–4.0)
Glucose: 99 mg/dL (ref 65–100)
Potassium: 5 mmol/L (ref 3.5–5.1)
Protein, total: 8.7 g/dL — ABNORMAL HIGH (ref 6.4–8.2)
Sodium: 136 mmol/L (ref 136–145)
eGFR: 39 mL/min/{1.73_m2} — ABNORMAL LOW (ref >60)

## 2021-01-19 LAB — CBC WITH AUTOMATED DIFF
ABS. BASOPHILS: 0 10*3/uL (ref 0.0–0.1)
ABS. EOSINOPHILS: 0.2 10*3/uL (ref 0.0–0.4)
ABS. IMM. GRANS.: 0 10*3/uL (ref 0.00–0.04)
ABS. LYMPHOCYTES: 0.8 10*3/uL (ref 0.8–3.5)
ABS. MONOCYTES: 1 10*3/uL (ref 0.0–1.0)
ABS. NEUTROPHILS: 9.2 10*3/uL — ABNORMAL HIGH (ref 1.8–8.0)
ABSOLUTE NRBC: 0 10*3/uL (ref 0.00–0.01)
BASOPHILS: 0 % (ref 0–1)
EOSINOPHILS: 2 % (ref 0–7)
HCT: 38.2 % (ref 36.6–50.3)
HGB: 12.7 g/dL (ref 12.1–17.0)
IMMATURE GRANULOCYTES: 0 % (ref 0.0–0.5)
LYMPHOCYTES: 7 % — ABNORMAL LOW (ref 12–49)
MCH: 28.2 PG (ref 26.0–34.0)
MCHC: 33.2 g/dL (ref 30.0–36.5)
MCV: 84.7 FL (ref 80.0–99.0)
MONOCYTES: 9 % (ref 5–13)
MPV: 11 FL (ref 8.9–12.9)
NEUTROPHILS: 82 % — ABNORMAL HIGH (ref 32–75)
NRBC: 0 PER 100 WBC
PLATELET: 260 10*3/uL (ref 150–400)
RBC: 4.51 M/uL (ref 4.10–5.70)
RDW: 15.9 % — ABNORMAL HIGH (ref 11.5–14.5)
WBC: 11.4 10*3/uL — ABNORMAL HIGH (ref 4.1–11.1)

## 2021-01-19 LAB — COMPREHENSIVE METABOLIC PANEL
ALT: 16 U/L (ref 12–78)
AST: 26 U/L (ref 15–37)
Albumin/Globulin Ratio: 0.6 — ABNORMAL LOW (ref 1.1–2.2)
Albumin: 3.3 g/dL — ABNORMAL LOW (ref 3.5–5.0)
Alkaline Phosphatase: 94 U/L (ref 45–117)
Anion Gap: 8 mmol/L (ref 5–15)
BUN: 23 MG/DL — ABNORMAL HIGH (ref 6–20)
Bun/Cre Ratio: 13 (ref 12–20)
CO2: 20 mmol/L — ABNORMAL LOW (ref 21–32)
Calcium: 9.7 MG/DL (ref 8.5–10.1)
Chloride: 108 mmol/L (ref 97–108)
Creatinine: 1.72 MG/DL — ABNORMAL HIGH (ref 0.70–1.30)
ESTIMATED GLOMERULAR FILTRATION RATE: 39 mL/min/{1.73_m2} — ABNORMAL LOW (ref >60)
Globulin: 5.4 g/dL — ABNORMAL HIGH (ref 2.0–4.0)
Glucose: 99 mg/dL (ref 65–100)
Potassium: 5 mmol/L (ref 3.5–5.1)
Sodium: 136 mmol/L (ref 136–145)
Total Bilirubin: 0.4 MG/DL (ref 0.2–1.0)
Total Protein: 8.7 g/dL — ABNORMAL HIGH (ref 6.4–8.2)

## 2021-01-19 LAB — CBC WITH AUTO DIFFERENTIAL
Basophils %: 0 % (ref 0–1)
Basophils Absolute: 0 10*3/uL (ref 0.0–0.1)
Eosinophils %: 2 % (ref 0–7)
Eosinophils Absolute: 0.2 10*3/uL (ref 0.0–0.4)
Granulocyte Absolute Count: 0 10*3/uL (ref 0.00–0.04)
Hematocrit: 38.2 % (ref 36.6–50.3)
Hemoglobin: 12.7 g/dL (ref 12.1–17.0)
Immature Granulocytes: 0 % (ref 0.0–0.5)
Lymphocytes %: 7 % — ABNORMAL LOW (ref 12–49)
Lymphocytes Absolute: 0.8 10*3/uL (ref 0.8–3.5)
MCH: 28.2 PG (ref 26.0–34.0)
MCHC: 33.2 g/dL (ref 30.0–36.5)
MCV: 84.7 FL (ref 80.0–99.0)
MPV: 11 FL (ref 8.9–12.9)
Monocytes %: 9 % (ref 5–13)
Monocytes Absolute: 1 10*3/uL (ref 0.0–1.0)
NRBC Absolute: 0 10*3/uL (ref 0.00–0.01)
Neutrophils %: 82 % — ABNORMAL HIGH (ref 32–75)
Neutrophils Absolute: 9.2 10*3/uL — ABNORMAL HIGH (ref 1.8–8.0)
Nucleated RBCs: 0 PER 100 WBC
Platelets: 260 10*3/uL (ref 150–400)
RBC: 4.51 M/uL (ref 4.10–5.70)
RDW: 15.9 % — ABNORMAL HIGH (ref 11.5–14.5)
WBC: 11.4 10*3/uL — ABNORMAL HIGH (ref 4.1–11.1)

## 2021-01-19 MED ORDER — ACETAMINOPHEN 325 MG TABLET
325 mg | Freq: Once | ORAL | Status: AC
Start: 2021-01-19 — End: 2021-01-19
  Administered 2021-01-19: 18:00:00 via ORAL

## 2021-01-19 MED ORDER — ACETAMINOPHEN SR 650 MG TAB
650 mg | ORAL_TABLET | Freq: Three times a day (TID) | ORAL | 0 refills | Status: AC
Start: 2021-01-19 — End: 2021-02-18

## 2021-01-19 MED ORDER — GABAPENTIN 300 MG CAP
300 mg | Freq: Once | ORAL | Status: AC
Start: 2021-01-19 — End: 2021-01-19
  Administered 2021-01-19: 18:00:00 via ORAL

## 2021-01-19 MED ORDER — DOXYCYCLINE HYCLATE 100 MG TAB
100 mg | ORAL_TABLET | Freq: Two times a day (BID) | ORAL | 0 refills | Status: AC
Start: 2021-01-19 — End: 2021-01-26

## 2021-01-19 MED FILL — MAPAP (ACETAMINOPHEN) 325 MG TABLET: 325 mg | ORAL | Qty: 2

## 2021-01-19 MED FILL — GABAPENTIN 300 MG CAP: 300 mg | ORAL | Qty: 1
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
